# Patient Record
Sex: Male | Born: 1960 | Race: Black or African American | Hispanic: No | Smoking: Former smoker
Health system: Southern US, Community
[De-identification: ages and names within clinical notes are randomized; demographics above are authoritative.]

## PROBLEM LIST (undated history)

## (undated) DIAGNOSIS — A159 Respiratory tuberculosis unspecified: Secondary | ICD-10-CM

## (undated) DIAGNOSIS — I10 Essential (primary) hypertension: Secondary | ICD-10-CM

## (undated) DIAGNOSIS — K859 Acute pancreatitis without necrosis or infection, unspecified: Secondary | ICD-10-CM

## (undated) DIAGNOSIS — R0600 Dyspnea, unspecified: Secondary | ICD-10-CM

## (undated) DIAGNOSIS — I429 Cardiomyopathy, unspecified: Secondary | ICD-10-CM

## (undated) DIAGNOSIS — K219 Gastro-esophageal reflux disease without esophagitis: Secondary | ICD-10-CM

## (undated) DIAGNOSIS — N184 Chronic kidney disease, stage 4 (severe): Secondary | ICD-10-CM

## (undated) DIAGNOSIS — M109 Gout, unspecified: Secondary | ICD-10-CM

## (undated) DIAGNOSIS — M1990 Unspecified osteoarthritis, unspecified site: Secondary | ICD-10-CM

## (undated) DIAGNOSIS — E669 Obesity, unspecified: Secondary | ICD-10-CM

## (undated) DIAGNOSIS — D649 Anemia, unspecified: Secondary | ICD-10-CM

## (undated) DIAGNOSIS — G4730 Sleep apnea, unspecified: Secondary | ICD-10-CM

## (undated) DIAGNOSIS — I509 Heart failure, unspecified: Secondary | ICD-10-CM

## (undated) DIAGNOSIS — R079 Chest pain, unspecified: Secondary | ICD-10-CM

## (undated) DIAGNOSIS — H409 Unspecified glaucoma: Secondary | ICD-10-CM

## (undated) DIAGNOSIS — Z973 Presence of spectacles and contact lenses: Secondary | ICD-10-CM

## (undated) DIAGNOSIS — C801 Malignant (primary) neoplasm, unspecified: Secondary | ICD-10-CM

## (undated) DIAGNOSIS — D126 Benign neoplasm of colon, unspecified: Secondary | ICD-10-CM

## (undated) HISTORY — PX: HERNIA REPAIR: SHX51

## (undated) HISTORY — DX: Benign neoplasm of colon, unspecified: D12.6

## (undated) HISTORY — DX: Respiratory tuberculosis unspecified: A15.9

## (undated) HISTORY — DX: Unspecified glaucoma: H40.9

## (undated) HISTORY — PX: UMBILICAL HERNIA REPAIR: SHX196

## (undated) HISTORY — PX: WISDOM TOOTH EXTRACTION: SHX21

## (undated) HISTORY — DX: Acute pancreatitis without necrosis or infection, unspecified: K85.90

## (undated) HISTORY — DX: Gastro-esophageal reflux disease without esophagitis: K21.9

## (undated) HISTORY — PX: LAPAROSCOPIC CHOLECYSTECTOMY: SUR755

## (undated) HISTORY — DX: Sleep apnea, unspecified: G47.30

## (undated) HISTORY — PX: COLONOSCOPY W/ BIOPSIES AND POLYPECTOMY: SHX1376

---
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
## 1997-11-12 ENCOUNTER — Encounter: Admission: RE | Admit: 1997-11-12 | Discharge: 1997-11-12 | Payer: Self-pay | Admitting: *Deleted

## 2001-12-06 ENCOUNTER — Emergency Department (HOSPITAL_COMMUNITY): Admission: EM | Admit: 2001-12-06 | Discharge: 2001-12-06 | Payer: Self-pay

## 2002-11-14 ENCOUNTER — Encounter: Admission: RE | Admit: 2002-11-14 | Discharge: 2002-11-14 | Payer: Self-pay | Admitting: Family Medicine

## 2002-11-14 ENCOUNTER — Encounter: Payer: Self-pay | Admitting: Family Medicine

## 2003-04-24 ENCOUNTER — Emergency Department (HOSPITAL_COMMUNITY): Admission: EM | Admit: 2003-04-24 | Discharge: 2003-04-24 | Payer: Self-pay | Admitting: Emergency Medicine

## 2003-11-13 ENCOUNTER — Encounter: Admission: RE | Admit: 2003-11-13 | Discharge: 2003-11-13 | Payer: Self-pay | Admitting: Family Medicine

## 2003-11-14 ENCOUNTER — Ambulatory Visit (HOSPITAL_COMMUNITY): Admission: RE | Admit: 2003-11-14 | Discharge: 2003-11-14 | Payer: Self-pay | Admitting: Family Medicine

## 2003-12-19 ENCOUNTER — Encounter (INDEPENDENT_AMBULATORY_CARE_PROVIDER_SITE_OTHER): Payer: Self-pay | Admitting: Specialist

## 2003-12-19 ENCOUNTER — Observation Stay (HOSPITAL_COMMUNITY): Admission: RE | Admit: 2003-12-19 | Discharge: 2003-12-20 | Payer: Self-pay | Admitting: General Surgery

## 2004-06-02 ENCOUNTER — Emergency Department (HOSPITAL_COMMUNITY): Admission: EM | Admit: 2004-06-02 | Discharge: 2004-06-02 | Payer: Self-pay | Admitting: Emergency Medicine

## 2004-07-29 ENCOUNTER — Emergency Department (HOSPITAL_COMMUNITY): Admission: EM | Admit: 2004-07-29 | Discharge: 2004-07-29 | Payer: Self-pay | Admitting: Emergency Medicine

## 2005-01-12 ENCOUNTER — Ambulatory Visit: Payer: Self-pay | Admitting: Family Medicine

## 2005-01-12 ENCOUNTER — Encounter: Admission: RE | Admit: 2005-01-12 | Discharge: 2005-01-12 | Payer: Self-pay | Admitting: Family Medicine

## 2005-01-14 ENCOUNTER — Ambulatory Visit: Payer: Self-pay | Admitting: Family Medicine

## 2005-01-23 ENCOUNTER — Ambulatory Visit: Payer: Self-pay | Admitting: Family Medicine

## 2005-03-26 ENCOUNTER — Ambulatory Visit: Payer: Self-pay | Admitting: Family Medicine

## 2005-03-27 ENCOUNTER — Ambulatory Visit: Payer: Self-pay | Admitting: Cardiology

## 2005-03-28 ENCOUNTER — Encounter: Admission: RE | Admit: 2005-03-28 | Discharge: 2005-03-28 | Payer: Self-pay | Admitting: Family Medicine

## 2005-03-30 ENCOUNTER — Ambulatory Visit: Payer: Self-pay | Admitting: Family Medicine

## 2005-08-12 ENCOUNTER — Emergency Department (HOSPITAL_COMMUNITY): Admission: EM | Admit: 2005-08-12 | Discharge: 2005-08-12 | Payer: Self-pay | Admitting: Emergency Medicine

## 2006-03-02 ENCOUNTER — Ambulatory Visit: Payer: Self-pay | Admitting: Family Medicine

## 2006-03-03 ENCOUNTER — Ambulatory Visit: Payer: Self-pay | Admitting: Family Medicine

## 2006-03-03 LAB — CONVERTED CEMR LAB
ALT: 18 units/L (ref 0–40)
AST: 19 units/L (ref 0–37)
Albumin: 3.9 g/dL (ref 3.5–5.2)
Alkaline Phosphatase: 36 units/L — ABNORMAL LOW (ref 39–117)
BUN: 19 mg/dL (ref 6–23)
Basophils Absolute: 0 10*3/uL (ref 0.0–0.1)
Basophils Relative: 0.1 % (ref 0.0–1.0)
CO2: 29 meq/L (ref 19–32)
Calcium: 9.9 mg/dL (ref 8.4–10.5)
Chloride: 105 meq/L (ref 96–112)
Chol/HDL Ratio, serum: 5.5
Cholesterol: 197 mg/dL (ref 0–200)
Creatinine, Ser: 2.4 mg/dL — ABNORMAL HIGH (ref 0.4–1.5)
Eosinophil percent: 0.1 % (ref 0.0–5.0)
GFR calc non Af Amer: 31 mL/min
Glomerular Filtration Rate, Af Am: 38 mL/min/{1.73_m2}
Glucose, Bld: 130 mg/dL — ABNORMAL HIGH (ref 70–99)
HCT: 37.4 % — ABNORMAL LOW (ref 39.0–52.0)
HDL: 35.9 mg/dL — ABNORMAL LOW (ref >39.0)
Hemoglobin: 12.6 g/dL — ABNORMAL LOW (ref 13.0–17.0)
Hgb A1c MFr Bld: 6.2 % — ABNORMAL HIGH (ref 4.6–6.0)
LDL Cholesterol: 142 mg/dL — ABNORMAL HIGH (ref 0–99)
Lymphocytes Relative: 8.6 % — ABNORMAL LOW (ref 12.0–46.0)
MCHC: 33.6 g/dL (ref 30.0–36.0)
MCV: 82.5 fL (ref 78.0–100.0)
Monocytes Absolute: 0.3 10*3/uL (ref 0.2–0.7)
Monocytes Relative: 3.1 % (ref 3.0–11.0)
Neutro Abs: 9.3 10*3/uL — ABNORMAL HIGH (ref 1.4–7.7)
Neutrophils Relative %: 88.1 % — ABNORMAL HIGH (ref 43.0–77.0)
PSA: 0.58 ng/mL (ref 0.10–4.00)
Platelets: 241 10*3/uL (ref 150–400)
Potassium: 3.8 meq/L (ref 3.5–5.1)
RBC: 4.54 M/uL (ref 4.22–5.81)
RDW: 12.8 % (ref 11.5–14.6)
Sodium: 144 meq/L (ref 135–145)
TSH: 0.42 microintl units/mL (ref 0.35–5.50)
Total Bilirubin: 0.6 mg/dL (ref 0.3–1.2)
Total Protein: 6.6 g/dL (ref 6.0–8.3)
Triglyceride fasting, serum: 98 mg/dL (ref 0–149)
VLDL: 20 mg/dL (ref 0–40)
WBC: 10.5 10*3/uL (ref 4.5–10.5)

## 2006-04-16 ENCOUNTER — Ambulatory Visit: Payer: Self-pay | Admitting: Family Medicine

## 2006-05-06 ENCOUNTER — Emergency Department (HOSPITAL_COMMUNITY): Admission: EM | Admit: 2006-05-06 | Discharge: 2006-05-06 | Payer: Self-pay | Admitting: Emergency Medicine

## 2006-06-21 ENCOUNTER — Ambulatory Visit: Payer: Self-pay | Admitting: Family Medicine

## 2006-07-26 ENCOUNTER — Ambulatory Visit: Payer: Self-pay | Admitting: Family Medicine

## 2006-08-10 ENCOUNTER — Ambulatory Visit: Payer: Self-pay | Admitting: Gastroenterology

## 2006-08-26 ENCOUNTER — Ambulatory Visit: Payer: Self-pay | Admitting: Gastroenterology

## 2006-08-26 ENCOUNTER — Encounter: Payer: Self-pay | Admitting: Gastroenterology

## 2006-09-28 ENCOUNTER — Ambulatory Visit: Payer: Self-pay | Admitting: Family Medicine

## 2006-09-29 LAB — CONVERTED CEMR LAB
BUN: 14 mg/dL (ref 6–23)
Basophils Absolute: 0 10*3/uL (ref 0.0–0.1)
Basophils Relative: 0.3 % (ref 0.0–1.0)
CO2: 26 meq/L (ref 19–32)
Calcium: 9.5 mg/dL (ref 8.4–10.5)
Chloride: 112 meq/L (ref 96–112)
Creatinine, Ser: 2 mg/dL — ABNORMAL HIGH (ref 0.4–1.5)
Eosinophils Absolute: 0.3 10*3/uL (ref 0.0–0.6)
Eosinophils Relative: 4.2 % (ref 0.0–5.0)
GFR calc Af Amer: 47 mL/min
GFR calc non Af Amer: 39 mL/min
Glucose, Bld: 83 mg/dL (ref 70–99)
HCT: 36.7 % — ABNORMAL LOW (ref 39.0–52.0)
Hemoglobin: 12.2 g/dL — ABNORMAL LOW (ref 13.0–17.0)
Lymphocytes Relative: 31.9 % (ref 12.0–46.0)
MCHC: 33.4 g/dL (ref 30.0–36.0)
MCV: 83.6 fL (ref 78.0–100.0)
Monocytes Absolute: 0.7 10*3/uL (ref 0.2–0.7)
Monocytes Relative: 11.5 % — ABNORMAL HIGH (ref 3.0–11.0)
Neutro Abs: 3.2 10*3/uL (ref 1.4–7.7)
Neutrophils Relative %: 52.1 % (ref 43.0–77.0)
Platelets: 202 10*3/uL (ref 150–400)
Potassium: 3.5 meq/L (ref 3.5–5.1)
RBC: 4.38 M/uL (ref 4.22–5.81)
RDW: 13.9 % (ref 11.5–14.6)
Sodium: 141 meq/L (ref 135–145)
Uric Acid, Serum: 8.7 mg/dL — ABNORMAL HIGH (ref 2.4–7.0)
WBC: 6.2 10*3/uL (ref 4.5–10.5)

## 2006-09-30 ENCOUNTER — Ambulatory Visit: Payer: Self-pay | Admitting: Family Medicine

## 2006-10-05 ENCOUNTER — Ambulatory Visit: Payer: Self-pay | Admitting: Family Medicine

## 2006-12-29 DIAGNOSIS — M549 Dorsalgia, unspecified: Secondary | ICD-10-CM | POA: Insufficient documentation

## 2006-12-30 DIAGNOSIS — M5126 Other intervertebral disc displacement, lumbar region: Secondary | ICD-10-CM | POA: Insufficient documentation

## 2006-12-31 ENCOUNTER — Ambulatory Visit: Payer: Self-pay | Admitting: Family Medicine

## 2006-12-31 DIAGNOSIS — E785 Hyperlipidemia, unspecified: Secondary | ICD-10-CM | POA: Insufficient documentation

## 2006-12-31 DIAGNOSIS — M109 Gout, unspecified: Secondary | ICD-10-CM | POA: Insufficient documentation

## 2006-12-31 DIAGNOSIS — I10 Essential (primary) hypertension: Secondary | ICD-10-CM | POA: Insufficient documentation

## 2007-01-10 ENCOUNTER — Emergency Department (HOSPITAL_COMMUNITY): Admission: EM | Admit: 2007-01-10 | Discharge: 2007-01-11 | Payer: Self-pay | Admitting: Emergency Medicine

## 2007-01-12 ENCOUNTER — Ambulatory Visit: Payer: Self-pay | Admitting: Family Medicine

## 2007-01-12 ENCOUNTER — Telehealth (INDEPENDENT_AMBULATORY_CARE_PROVIDER_SITE_OTHER): Payer: Self-pay | Admitting: *Deleted

## 2007-01-12 ENCOUNTER — Telehealth: Payer: Self-pay | Admitting: Family Medicine

## 2007-01-13 ENCOUNTER — Telehealth: Payer: Self-pay | Admitting: Family Medicine

## 2007-01-14 ENCOUNTER — Telehealth (INDEPENDENT_AMBULATORY_CARE_PROVIDER_SITE_OTHER): Payer: Self-pay | Admitting: *Deleted

## 2007-01-16 ENCOUNTER — Encounter: Admission: RE | Admit: 2007-01-16 | Discharge: 2007-01-16 | Payer: Self-pay | Admitting: Family Medicine

## 2007-01-17 ENCOUNTER — Telehealth (INDEPENDENT_AMBULATORY_CARE_PROVIDER_SITE_OTHER): Payer: Self-pay | Admitting: *Deleted

## 2007-01-18 ENCOUNTER — Ambulatory Visit: Payer: Self-pay | Admitting: Family Medicine

## 2007-05-07 ENCOUNTER — Emergency Department (HOSPITAL_COMMUNITY): Admission: EM | Admit: 2007-05-07 | Discharge: 2007-05-08 | Payer: Self-pay | Admitting: Emergency Medicine

## 2008-05-16 ENCOUNTER — Ambulatory Visit: Payer: Self-pay | Admitting: Family Medicine

## 2008-05-16 LAB — CONVERTED CEMR LAB
ALT: 21 units/L (ref 0–53)
AST: 21 units/L (ref 0–37)
Albumin: 3.8 g/dL (ref 3.5–5.2)
Alkaline Phosphatase: 49 units/L (ref 39–117)
BUN: 18 mg/dL (ref 6–23)
Basophils Absolute: 0 10*3/uL (ref 0.0–0.1)
Basophils Relative: 0 % (ref 0.0–3.0)
Bilirubin Urine: NEGATIVE
Bilirubin, Direct: 0.1 mg/dL (ref 0.0–0.3)
Blood in Urine, dipstick: NEGATIVE
CO2: 29 meq/L (ref 19–32)
Calcium: 9 mg/dL (ref 8.4–10.5)
Chloride: 105 meq/L (ref 96–112)
Cholesterol: 193 mg/dL (ref 0–200)
Creatinine, Ser: 2.2 mg/dL — ABNORMAL HIGH (ref 0.4–1.5)
Eosinophils Absolute: 0.2 10*3/uL (ref 0.0–0.7)
Eosinophils Relative: 3.9 % (ref 0.0–5.0)
GFR calc Af Amer: 41 mL/min
GFR calc non Af Amer: 34 mL/min
Glucose, Bld: 92 mg/dL (ref 70–99)
Glucose, Urine, Semiquant: NEGATIVE
HCT: 42.4 % (ref 39.0–52.0)
HDL: 30.7 mg/dL — ABNORMAL LOW (ref >39.0)
Hemoglobin: 14.1 g/dL (ref 13.0–17.0)
Ketones, urine, test strip: NEGATIVE
LDL Cholesterol: 143 mg/dL — ABNORMAL HIGH (ref 0–99)
Lymphocytes Relative: 40.9 % (ref 12.0–46.0)
MCHC: 33.3 g/dL (ref 30.0–36.0)
MCV: 81.5 fL (ref 78.0–100.0)
Monocytes Absolute: 0.3 10*3/uL (ref 0.1–1.0)
Monocytes Relative: 6.2 % (ref 3.0–12.0)
Neutro Abs: 2.6 10*3/uL (ref 1.4–7.7)
Neutrophils Relative %: 49 % (ref 43.0–77.0)
Nitrite: NEGATIVE
PSA: 0.55 ng/mL (ref 0.10–4.00)
Platelets: 160 10*3/uL (ref 150–400)
Potassium: 3.9 meq/L (ref 3.5–5.1)
RBC: 5.2 M/uL (ref 4.22–5.81)
RDW: 13 % (ref 11.5–14.6)
Sodium: 141 meq/L (ref 135–145)
Specific Gravity, Urine: 1.015
TSH: 1.9 microintl units/mL (ref 0.35–5.50)
Total Bilirubin: 0.7 mg/dL (ref 0.3–1.2)
Total CHOL/HDL Ratio: 6.3
Total Protein: 6.5 g/dL (ref 6.0–8.3)
Triglycerides: 96 mg/dL (ref 0–149)
Urobilinogen, UA: 0.2
VLDL: 19 mg/dL (ref 0–40)
WBC Urine, dipstick: NEGATIVE
WBC: 5.3 10*3/uL (ref 4.5–10.5)
pH: 6

## 2008-06-14 ENCOUNTER — Ambulatory Visit: Payer: Self-pay | Admitting: Family Medicine

## 2008-07-05 ENCOUNTER — Ambulatory Visit: Payer: Self-pay | Admitting: Family Medicine

## 2008-08-16 ENCOUNTER — Emergency Department (HOSPITAL_COMMUNITY): Admission: EM | Admit: 2008-08-16 | Discharge: 2008-08-16 | Payer: Self-pay | Admitting: Family Medicine

## 2008-10-02 ENCOUNTER — Telehealth: Payer: Self-pay | Admitting: Family Medicine

## 2009-02-08 ENCOUNTER — Telehealth: Payer: Self-pay | Admitting: Family Medicine

## 2009-03-13 ENCOUNTER — Emergency Department (HOSPITAL_COMMUNITY): Admission: EM | Admit: 2009-03-13 | Discharge: 2009-03-13 | Payer: Self-pay | Admitting: Emergency Medicine

## 2009-03-26 ENCOUNTER — Telehealth: Payer: Self-pay | Admitting: Family Medicine

## 2010-04-13 ENCOUNTER — Encounter: Payer: Self-pay | Admitting: Family Medicine

## 2010-04-22 NOTE — Progress Notes (Signed)
Summary: refill  Phone Note Call from Patient   Summary of Call: refill colchicine   Initial call taken by: Westley Hummer CMA (AAMA),  February 08, 2009 8:32 AM    New/Updated Medications: COLCHICINE 0.6 MG TABS (COLCHICINE) take one tab every 6 hours as needed Prescriptions: COLCHICINE 0.6 MG TABS (COLCHICINE) take one tab every 6 hours as needed  #90 x 1   Entered by:   Westley Hummer CMA ([place])   Authorized by:   Dorena Cookey MD   Signed by:   Westley Hummer CMA ([place]) on 02/08/2009   Method used:   Electronically to        [facility] (retail)       [address]       [place], [place]  [id number]       Ph: [phone]       Fax: [phone]   RxIDKB:[id number]

## 2010-04-22 NOTE — Progress Notes (Signed)
Summary: Larger MRI machine needed  Phone Note Call from Patient Call back at Home Phone [phone]   Caller: Patient Call For: Sherren Mocha Summary of Call: Pt called to report he was unable to fit into the MRI machine.  Was instructed to contact provider to schedule at a larger unit.  Pt weight is 281, height 6'2".  Not claustrobic. Initial call taken by: Nira Conn LPN,  October 22, [id number] 3:41 PM  Follow-up for Phone Call        Appt with [facility] @ 7:00pm patient in office Follow-up by: Rolinda Roan,  January 12, 2007 4:07 PM

## 2010-04-22 NOTE — Assessment & Plan Note (Signed)
Summary: fu on mri/njr   Vital Signs:  Patient Profile:   50 Years Old Male Weight:      273 pounds (124.09 kg) Temp:     98.1 degrees F (36.72 degrees C) oral BP sitting:   140 / 76  (right arm)  Pt. in pain?   no  Vitals Entered By: Vilinda Blanks, RN (January 18, 2007 10:15 AM)                  Chief Complaint:  f/u and fill out work forms.  History of Present Illness: Craig Flynn is a 50 year old male comes in today for evaluation of back pain.  He was seen last week with severe back pain.  On 1020 2008.  He was given a pain medication Flexeril advised to stay at home at bedrest.  Over the course the week.  His pain became worse.  He centrally had an MRI which showed no change from a previous MRI and 2006 for the same problem.  We do not see evidence of any surgical treatment at this juncture.  Patient is back today for follow-up saying his 85% better.  He wants to go back to work next Monday.  He is to start PT this Thursday.  He is also concerned about some fluid retention.  Would like a low-dose diuretic.  Acute Visit History:      He denies abdominal pain, chest pain, constipation, cough, diarrhea, earache, eye symptoms, fever, genitourinary symptoms, headache, musculoskeletal symptoms, nasal discharge, nausea, rash, sinus problems, sore throat, and vomiting.         Current Allergies (reviewed today): No known allergies       Physical Exam  General:     Well-developed,well-nourished,in no acute distress; alert,appropriate and cooperative throughout examination Msk:     No deformity or scoliosis noted of thoracic or lumbar spine.   Pulses:     R and L carotid,radial,femoral,dorsalis pedis and posterior tibial pulses are full and equal bilaterally Extremities:     1+ left pedal edema and 1+ right pedal edema.   Neurologic:     No cranial nerve deficits noted. Station and gait are normal. Plantar reflexes are down-going bilaterally. DTRs are symmetrical  throughout. Sensory, motor and coordinative functions appear intact.straight leg raising negative.  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 Uad 2)  Amoxicillin-pot Clavulanate 875-125 Mg Tabs (Amoxicillin-pot clavulanate) 3)  Atenolol-chlorthalidone 50-25 Mg Tabs (Atenolol-chlorthalidone) 4)  Viagra 100 Mg Tabs (Sildenafil citrate) 5)  Flexeril 10 Mg Tabs (Cyclobenzaprine hcl) .Marland Kitchen.. 1 tab @ bedtime 6)  Vicodin Es 7.5-750 Mg Tabs (Hydrocodone-acetaminophen) .... One two times a day prn 7)  Hydrochlorothiazide 25 Mg Tabs (Hydrochlorothiazide) .... Take 1 tablet by mouth every morning   Patient Instructions: 1)  take 600 mg of Motrin 3 times a day with food.  He may take a half a Vicodin or full Vicodin once or twice a day as needed for pain.  Take Flexeril, 10 mg one at bedtime.  Walk lie down walk light and avoid sitting.  Start physical therapy on, Thursday, it's okay to return to work the following Monday November the third    Prescriptions: HYDROCHLOROTHIAZIDE 25 MG  TABS (HYDROCHLOROTHIAZIDE) Take 1 tablet by mouth every morning  #100  x 4   Entered and Authorized by:   Craig Cookey MD   Signed by:   Craig Cookey MD on 01/18/2007   Method used:   Print then Give to Patient   RxID:   [id number] VICODIN ES 7.5-750 MG  TABS (HYDROCODONE-ACETAMINOPHEN) one two times a day prn  #60 x 1   Entered and Authorized by:   Craig Cookey MD   Signed by:   Craig Cookey MD on 01/18/2007   Method used:   Print then Give to Patient   RxID:   [id number] FLEXERIL 10 MG  TABS (CYCLOBENZAPRINE HCL) 1 tab @ bedtime  #30 x 1   Entered and Authorized by:   Craig Cookey MD   Signed by:   Craig Cookey MD on 01/18/2007   Method used:   Print  then Give to Patient   RxIDLL:[id number]  ]

## 2010-04-22 NOTE — Progress Notes (Signed)
Summary: NEEDS APPT FOR MRI  Phone Note Call from Patient Call back at Home Phone [phone]   Caller: Patient Call For: TODD Summary of Call: NEEDS MRI RESCHEDULED  THE LAST ONE WAS TOO SMALL NEEDS TO GO TO OPEN MRI Initial call taken by: Shanon Payor,  January 14, 2007 10:37 AM  Follow-up for Phone Call        Faxed order to [facility] for an open unit Office will contact patient Follow-up by: Rolinda Roan,  January 14, 2007 12:31 PM         Appended Document: NEEDS APPT FOR MRI 10/26 @ 7:45am @ [facility] Patient aware

## 2010-04-22 NOTE — Progress Notes (Signed)
Summary: REFILL  Phone Note From Pharmacy Call back at [phone]   Caller: [facility] [address] Call For: Craig Flynn  Summary of Call: [place]  Initial call taken by: Shanon Payor,  January 13, 2007 9:57 AM  Follow-up for Phone Call        Pharmacist called, GOT NEW RX FROM ANOTHER DR WHILE DR Jilliane Kazanjian WAS OT OF THE OFFICE.  RX WAS GIVEN TO THE PT. THE PHARMACY SAID "THEY HAVE NOT RECEIVED THAT RX YET Follow-up by: Vilinda Blanks, RN,  January 13, 2007 10:20 AM

## 2010-04-22 NOTE — Progress Notes (Signed)
Summary: gout med  Phone Note Call from Patient Call back at Home Phone [phone]   Caller: vm Call For: todd Summary of Call: [place].  Went to ER for gout flareup.  Prednisone given, but not enough given, it did not control flareup.  Can Dr. Sherren Mocha call in another Rx until I can get my Medicaid?  [facility] [place]. Initial call taken by: Shelbie Hutching, RN,  March 26, 2009 11:15 AM  Follow-up for Phone Call        patient needs office visit Follow-up by: Dorena Cookey MD,  March 26, 2009 1:32 PM  Additional Follow-up for Phone Call Additional follow up Details #1::        Phone Call Completed.  Patient will not set appointment at this time because he has no insurance & no cash for the cost of ov. Additional Follow-up by: Shelbie Hutching, RN,  March 26, 2009 3:12 PM

## 2010-04-22 NOTE — Progress Notes (Signed)
Summary: NEEDS RESULTS  Phone Note Call from Patient Call back at Home Phone [phone]   Caller: Patient Call For: DR TODD Reason for Call: Lab or Test Results Summary of Call: WOULD LIKE [place]. DOES HE NEED AN OV TO DISCUSS? Initial call taken by: Despina Arias,  January 17, 2007 10:51 AM  Follow-up for Phone Call        I called the patient and talked to him personally reviewed his scan.  It's essentially unchanged.  The one he had in 2006.  He is to continue the Vicodin walk lie down walk lie down.  He says he also is paperwork.  He needs to fill out.  He will be given appointment for more to get that done.  Will also get him set up for physical therapy ASAP. Follow-up by: Dorena Cookey MD,  January 17, 2007 2:16 PM  Additional Follow-up for Phone Call Additional follow up Details #1::        Faxed order to [facility]/office will contact patient Additional Follow-up by: Rolinda Roan,  January 17, 2007 2:20 PM

## 2010-04-22 NOTE — Assessment & Plan Note (Signed)
Summary: ?kidney inf/njr  Medications Added AMOXICILLIN-POT CLAVULANATE 875-125 MG TABS (AMOXICILLIN-POT CLAVULANATE)  ATENOLOL-CHLORTHALIDONE 50-25 MG TABS (ATENOLOL-CHLORTHALIDONE)  VIAGRA 100 MG TABS (SILDENAFIL CITRATE)       Allergies Added: NKDA  Vital Signs:  Patient Profile:   50 Years Old Male Weight:      278 pounds (126.36 kg) Temp:     98.8 degrees F (37.11 degrees C) oral BP sitting:   150 / 70  (right arm)  Pt. in pain?   yes    Location:   lower back    Intensity:   2-9    Type:       sharp  Vitals Entered By: Vilinda Blanks, RN (December 31, 2006 12:10 PM)                  Chief Complaint:  "lower back pain and legs are numb when I stand up sometimes".  History of Present Illness: Craig Flynn is a 50 year male, who comes in today for evaluation of the sudden onset of severe back pain.  On December 29, 2006 at 4:30 a.m.  He went to bed felt fine woke up at 430 in with severe back pain.  He points to the T12, L1 area as the source of his pain.  He says his right-sided and left-sided and radiates down both legs to mid calves.  It's constant sharp, on a [place]  It's increased by bending or sitting.  In the spell like this a couple years ago wasn't as bad.  He took some Motrin and went away.  Review of systems negative urines been normal.  No history kidney stones.  No history of trauma.  Acute Visit History:      He denies abdominal pain, chest pain, constipation, cough, diarrhea, earache, eye symptoms, fever, genitourinary symptoms, headache, musculoskeletal symptoms, nasal discharge, nausea, rash, sinus problems, sore throat, and vomiting.         Current Allergies: No known allergies   Past Medical History:    Reviewed history and no changes required:       Gout       Hyperlipidemia       Hypertension   Family History:    Reviewed history and no changes required:  Social History:    Reviewed history and no changes required:   Risk Factors:   Tobacco use:  current    Counseled to quit/cut down tobacco use:  yes Alcohol use:  no   Review of Systems      See HPI   Physical Exam  General:     Well-developed,well-nourished,in no acute distress; alert,appropriate and cooperative throughout examination Abdomen:     Bowel sounds positive,abdomen soft and non-tender without masses, organomegaly or hernias noted. Msk:     No deformity or scoliosis noted of thoracic or lumbar spine.   Pulses:     R and L carotid,radial,femoral,dorsalis pedis and posterior tibial pulses are full and equal bilaterally Extremities:     No clubbing, cyanosis, edema, or deformity noted with normal full range of motion of all joints.   Neurologic:     No cranial nerve deficits noted. Station and gait are normal. Plantar reflexes are down-going bilaterally. DTRs are symmetrical throughout. Sensory, motor and coordinative functions appear intact. Skin:     Intact without suspicious lesions or rashes Psych:     Cognition and judgment appear intact. Alert and cooperative with normal attention span and concentration. No apparent delusions, illusions, hallucinations    Impression &  Recommendations:  Problem # 1:  BACKACHE NOS (ICD-724.5) Assessment: New  Orders: UA Dipstick w/o Micro (81002)   Complete Medication List: 1)  Prednisone 20 Mg Tabs (Prednisone) .... Uad 2)  Amoxicillin-pot Clavulanate 875-125 Mg Tabs (Amoxicillin-pot clavulanate) 3)  Atenolol-chlorthalidone 50-25 Mg Tabs (Atenolol-chlorthalidone) 4)  Viagra 100 Mg Tabs (Sildenafil citrate)   Patient Instructions: 1)  I think you have a bulging disk in her back giving it is severe pain.  I want to stay at bed rest, get up to the bathroom to eat otherwise stated bed rest.  All day today.  All day Saturday, and all day Sunday.  Return 8:30 a.m. Monday morning for recheck. 2)  Take  two 20 mg prednisone tablets a day, Flexeril, 10 mg 3 times a day, and Vicodin ES 3 times a day.remember  medication and bedrest makes people constipated to take some milk of Magnesia or prune juice.    ]

## 2010-04-22 NOTE — Progress Notes (Signed)
Summary: pt requesting refill of muscle relaxer for back pain  Phone Note Call from Patient Call back at Home Phone [phone]   Caller: Patient Call For: Craig Cookey MD Summary of Call: pt is requesting refill of a muscle relaxer that Dr. Sherren Mocha had prescribed for back pain. Call it in to [facility] on [address].  Initial call taken by: Braulio Bosch,  October 02, 2008 12:17 PM  Follow-up for Phone Call        rx called in Follow-up by: Westley Hummer CMA,  October 02, 2008 1:44 PM    Flexeril 10 mg, dispensed 30 tablets, one half tablet nightly, p.r.n., low back pain, no refills

## 2010-04-22 NOTE — Assessment & Plan Note (Signed)
Summary: 3 WK ROV/NJR   Vital Signs:  Patient profile:   50 year old male Weight:      286 pounds Temp:     98.5 degrees F oral BP sitting:   144 / 98  (left arm) Cuff size:   large  Vitals Entered By: Westley Hummer CMA (July 05, 2008 9:07 AM)  Reason for Visit follow up bp  History of Present Illness: Craig Flynn is a 50 year old male, who comes in today for evaluation of hypertension.  He takes Tenoretic 50 to 25 and 25 mg of HCTZ every morning for blood pressure and fluid control.  BP is still running 150/90 at home.  No side effects from medication, pulse 60 and regular  Allergies (verified): No Known Drug Allergies PMH reviewed for relevance  Social History:    Reviewed history and no changes required:  Review of Systems      See HPI  Physical Exam  General:  Well-developed,well-nourished,in no acute distress; alert,appropriate and cooperative throughout examination Heart:  150/90   Impression & Recommendations:  Problem # 1:  HYPERTENSION (ICD-401.9) Assessment Unchanged  His updated medication list for this problem includes:    Atenolol-chlorthalidone 50-25 Mg Tabs (Atenolol-chlorthalidone) .Marland Kitchen... Take 1 tablet by mouth every morning    Hydrochlorothiazide 25 Mg Tabs (Hydrochlorothiazide) .Marland Kitchen... Take 1 tablet by mouth every morning    Norvasc 10 Mg Tabs (Amlodipine besylate) .Marland Kitchen... Take 1 tablet by mouth every morning  Complete Medication List: 1)  Atenolol-chlorthalidone 50-25 Mg Tabs (Atenolol-chlorthalidone) .... Take 1 tablet by mouth every morning 2)  Viagra 100 Mg Tabs (Sildenafil citrate) .... Uad 3)  Hydrochlorothiazide 25 Mg Tabs (Hydrochlorothiazide) .... Take 1 tablet by mouth every morning 4)  Allopurinol 300 Mg Tabs (Allopurinol) .... Take 1 tablet by mouth once a day 5)  Norvasc 10 Mg Tabs (Amlodipine besylate) .... Take 1 tablet by mouth every morning  Patient Instructions: 1)  add Norvasc, 10 mg every morning to your other blood pressure  medications.  Check her blood pressure every morning.  Return 4 weeks for follow-up Prescriptions: NORVASC 10 MG TABS (AMLODIPINE BESYLATE) Take 1 tablet by mouth every morning  #100 x 3   Entered and Authorized by:   Dorena Cookey MD   Signed by:   Dorena Cookey MD on 07/05/2008   Method used:   Electronically to        [facility]. [id number]* (retail)       [address]       [place], [place]  [address]       Ph: [phone] or [phone]       Fax: [phone]   RxID:   [id number]

## 2010-06-23 LAB — POCT I-STAT, CHEM 8
BUN: 35 mg/dL — ABNORMAL HIGH (ref 6–23)
Calcium, Ion: 1.24 mmol/L (ref 1.12–1.32)
Chloride: 108 mEq/L (ref 96–112)
Creatinine, Ser: 2.3 mg/dL — ABNORMAL HIGH (ref 0.4–1.5)
Glucose, Bld: 80 mg/dL (ref 70–99)
HCT: 42 % (ref 39.0–52.0)
Hemoglobin: 14.3 g/dL (ref 13.0–17.0)
Potassium: 3.9 mEq/L (ref 3.5–5.1)
Sodium: 140 mEq/L (ref 135–145)
TCO2: 26 mmol/L (ref 0–100)

## 2010-07-01 LAB — SYNOVIAL CELL COUNT + DIFF, W/ CRYSTALS
Lymphocytes-Synovial Fld: 3 % (ref 0–20)
Monocyte-Macrophage-Synovial Fluid: 17 % — ABNORMAL LOW (ref 50–90)
Neutrophil, Synovial: 80 % — ABNORMAL HIGH (ref 0–25)
WBC, Synovial: 4000 /mm3 — ABNORMAL HIGH (ref 0–200)

## 2010-07-01 LAB — BODY FLUID CULTURE: Culture: NO GROWTH

## 2010-07-01 LAB — URIC ACID: Uric Acid, Serum: 10.9 mg/dL — ABNORMAL HIGH (ref 4.0–7.8)

## 2010-08-05 NOTE — Assessment & Plan Note (Signed)
[facility] OFFICE NOTE   Craig Flynn, Craig Flynn                        MRN:          [id number]  DATE:08/10/2006                            DOB:          1960/06/03    REASON FOR REFERRAL:  Fecal incontinence and hematochezia.   HISTORY OF PRESENT ILLNESS:  Craig Flynn is a 50 year old African-American  male referred through the courtesy of Dr. Alysia Penna.  He has no  chronic gastrointestinal complaints.  He notes a one-month history of  small amounts of liquid stool and small amounts of mucus that  leak from  his rectum approximately 30 minute after a bowel movement.  Symptoms  began four weeks ago and have persisted on a daily basis since that  time.  He had one episode of a very small of bright red blood per rectum  that occurred approximately one month ago without recurrence.  He notes  no back pain, rectal pain, leg weakness, constipation, diarrhea,  abdominal pain or weight loss.  He has not had any recent medication  changes.  There is no family history of colon cancer, colon polyps, or  inflammatory bowel disease.  He denies any prior lumbosacral disorders  or other neurologic disorders.   PAST MEDICAL HISTORY:  1. Hypertension.  2. Status post cholecystectomy.   CURRENT MEDICATIONS:  Put on the chart, updated and reviewed.   MEDICATION ALLERGIES:  None known.   SOCIAL HISTORY AND REVIEW OF SYSTEMS:  Per the hand-written form.   PHYSICAL EXAMINATION:  GENERAL:  Overweight African-American male in no  acute distress.  VITAL SIGNS:  Height 62 inches.  Weight 274 pounds.  Blood pressure  112/70.  Pulse 92 and regular.  HEENT EXAM:  Anicteric sclerae.  Oropharynx clear.  CHEST:  Clear to auscultation bilaterally.  CARDIAC:  Regular rate and rhythm without murmurs appreciated.  ABDOMEN:  Soft, nontender, nondistended.  Normoactive bowel sounds.  No  palpable organomegaly, masses, or hernias.  RECTAL  EXAMINATION:  Reveals a slightly decreased sphincter tone with a  slightly decreased squeeze.  No lesions.  Hemoccult negative brown stool  is noted in the vault.  EXTREMITIES:  Without clubbing, cyanosis, or edema.  NEUROLOGIC:  Alert and oriented x3.  Grossly nonfocal.   ASSESSMENT AND Flynn:  Fecal incontinence with one episode of small  volume hematochezia.  Rule out rectal lesions.  Rule out a neurological  process.  The risks, benefits, and alternatives to colonoscopy with  possible biopsy and possible polypectomy discussed with the patient and  he consents to proceed.  This will be scheduled electively.  In the  interim he is to increase his fluid and fiber intake.  If the  colonoscopy is unremarkable will proceed with a lumbosacral MRI.  If his  symptoms persist and no etiology is uncovered he may need further  evaluation by a neurologist and with anorectal manometry studies at a  tertiary medical center.     Craig Flynn. Craig Plan, MD, [facility]  Electronically Signed    MTS/MedQ  DD: 08/11/2006  DT: 08/11/2006  Job #: [id number]  cc:   Craig Flynn. Craig Jews, MD

## 2010-08-05 NOTE — Assessment & Plan Note (Signed)
[facility]                                 ON-CALL NOTE   CREW, OCANA                          MRN:          [id number]  DATE:05/07/2007                            DOB:          05-04-1960    Patient of Dr. Sherren Mocha.   Patient calling because he has had a 3-day history of swelling in his  groin and pain.  Advised to go to the emergency room for evaluation.     Jeffrey A. Sherren Mocha, MD  Electronically Signed    JAT/MedQ  DD: 05/07/2007  DT: 05/09/2007  Job #: [id number]

## 2010-08-08 NOTE — Op Note (Signed)
NAME:  ZANDON, PINELL                 ACCOUNT NO.:  [id number]   MEDICAL RECORD NO.:  [id number]          PATIENT TYPE:  AMB   LOCATION:  DAY                          FACILITY:  [facility]   PHYSICIAN:  Edsel Petrin. Dalbert Batman, M.D.DATE OF BIRTH:  December 27, 1960   DATE OF PROCEDURE:  12/19/2003  DATE OF DISCHARGE:                                 OPERATIVE REPORT   PREOPERATIVE DIAGNOSIS:  Biliary dyskinesia.   POSTOPERATIVE DIAGNOSIS:  Biliary dyskinesia.   PROCEDURE:  Laparoscopic cholecystectomy with intraoperative cholangiogram.   SURGEON:  Edsel Petrin. Dalbert Batman, M.D.   FIRST ASSISTANT:  Shellia Carwin, M.D.   INDICATIONS FOR PROCEDURE:  This is a 50 year old black male who recently  has had three distinct episodes of postprandial right upper quadrant pain,  nausea, and vomiting.  These tend to occur after heavy meals.  Dr. Sherren Mocha  suspected gallbladder disease and placed him on a low fat diet, and that has  controlled his symptoms.  The gallbladder ultrasound is normal.  Hepatobiliary scan was performed which showed decreased ejection fraction,  thought to be consistent with gallbladder dysfunction.  His liver function  tests and lipase and amylase and CBC are all normal.  He is brought to the  operating room electively following counseling as an outpatient.   FINDINGS:  The gallbladder was not thick-walled and was not acutely  inflamed.  It was somewhat discolored.  The cystic duct itself was extremely  tiny but was patent, and we were able to do a cholangiogram.  The  cholangiogram showed a small biliary duct system, but the intrahepatic and  extrahepatic biliary anatomy was normal.  There was no filling defect, and  there was good flow of contrast into the duodenum.  The stomach, duodenum,  small intestine, large intestine were grossly normal to inspection.   DESCRIPTION OF PROCEDURE:  Following the induction of general endotracheal  anesthesia, the patient's abdomen was prepped and draped  in a sterile  fashion.  Marcaine 0.5% with epinephrine was used as a local infiltration  anesthetic.   I made a short vertical incision below the umbilicus.  Dissection was  carried down to the abdominal wall fascia.  There was fairly intense  scarring in this area from a previous hernia repair with mesh.  We did  elevate the fascia and perhaps some of the mesh and made an incision and got  into the preperitoneal space.  I was not able to safely dissect in this area  and was concerned that there might be small bowel adhesions underneath this.  For this reason, I then placed a 10-mm Optiview port in the left abdomen  about 6 to 8 cm above the umbilicus and through the left rectus sheath.  This was inserted uneventfully.  Pneumoperitoneum was created.  We actually  found that there were almost no adhesions in the abdomen.  We then finished  passing the Hasson trocar through the infraumbilical port and secured this  with a pursestring suture of 0 Vicryl.  We then placed the camera back into  the infraumbilical port.  Visualization was carried out,  with findings as  described above.  A 10-mm trocar was placed in the subxiphoid region, and  two 5-mm trocars were placed in the right midabdomen.  The gallbladder  fundus was identified and elevated.  The infundibulum of the gallbladder was  identified and retracted laterally.  I dissected out the cystic duct and the  cystic artery.  The cystic artery was controlled with multiple metal clips  and divided.  A nice large window was created behind the cystic duct.  A  metal clip was placed on the cystic duct close to the gallbladder.  The  cystic duct was incised very carefully because it was so tiny.  We were able  to insert a cholangiogram catheter.  The cholangiogram was obtained using  the C-arm.  The intrahepatic and extrahepatic biliary anatomy was normal.  There was no filling defect.  Contrast passed nicely prograde into the  duodenum.  The  cholangiogram catheter was removed.  The cystic duct was  secured with multiple metal clips and divided.  The gallbladder was  dissected from its bed with electrocautery and removed through the umbilical  port.  The operative field was copiously irrigated.  Hemostasis was  excellent and achieved with electrocautery.  At the completion of the case,  there was no bleeding and no bile leak whatsoever, and the irrigation fluid  was clear.  The trocars were removed under direct vision, and there was no  bleeding from the trocar sites.  The pneumoperitoneum was released.  The  fascia at the umbilicus was closed with 0 Vicryl sutures.  The skin  incisions were closed with subcuticular sutures of 4-0 Vicryl and Steri-  Strips.   The patient tolerated the procedure well and was taken to the recovery room  in stable condition.  Estimated blood loss was about 15 cc.  There were no  complications.  Sponge, needle, and instrument counts were correct.      HMI/MEDQ  D:  12/19/2003  T:  12/19/2003  Job:  [id number]   cc:   Dellis Filbert A. Sherren Mocha, M.D. [facility]

## 2010-08-08 NOTE — Assessment & Plan Note (Signed)
[place]                                 ON-CALL NOTE   Craig Flynn, Craig Flynn                          MRN:          [id number]  DATE:07/13/2006                            DOB:          24-Sep-1960    TIME RECEIVED:  5:58 p.m.   CALLER:  Gavin Pound.   PRIMARY CARE PHYSICIAN:  Dr. Sherren Mocha.   TELEPHONE NUMBER:  [id number].   Patient describes slow bleeding from the rectum over the past two days.  His bowel movements have been regular.  There is no pain involved.  He  simply has some slow dripping of blood intermittently from the rectum.   My advice is to call the office and set up an appointment to see Dr.  Sherren Mocha tomorrow.     [name]. Sarajane Jews, MD  Electronically Signed    SAF/MedQ  DD: 07/13/2006  DT: 07/14/2006  Job #: [id number]

## 2010-12-12 LAB — URINALYSIS, ROUTINE W REFLEX MICROSCOPIC
Glucose, UA: NEGATIVE
Hgb urine dipstick: NEGATIVE
Ketones, ur: NEGATIVE
Nitrite: NEGATIVE
Protein, ur: 100 — AB
Specific Gravity, Urine: 1.034 — ABNORMAL HIGH
Urobilinogen, UA: 1
pH: 6

## 2010-12-12 LAB — URINE MICROSCOPIC-ADD ON

## 2010-12-13 ENCOUNTER — Emergency Department (HOSPITAL_COMMUNITY): Payer: Self-pay

## 2010-12-13 ENCOUNTER — Emergency Department (HOSPITAL_COMMUNITY)
Admission: EM | Admit: 2010-12-13 | Discharge: 2010-12-13 | Disposition: A | Discharge status: Home / Self Care | Payer: Self-pay | Attending: Emergency Medicine | Admitting: Emergency Medicine

## 2010-12-13 DIAGNOSIS — Z79899 Other long term (current) drug therapy: Secondary | ICD-10-CM | POA: Insufficient documentation

## 2010-12-13 DIAGNOSIS — I10 Essential (primary) hypertension: Secondary | ICD-10-CM | POA: Insufficient documentation

## 2010-12-13 DIAGNOSIS — F17200 Nicotine dependence, unspecified, uncomplicated: Secondary | ICD-10-CM | POA: Insufficient documentation

## 2010-12-13 DIAGNOSIS — M25569 Pain in unspecified knee: Secondary | ICD-10-CM | POA: Insufficient documentation

## 2010-12-31 LAB — COMPREHENSIVE METABOLIC PANEL
ALT: 110 — ABNORMAL HIGH
AST: 44 — ABNORMAL HIGH
Albumin: 3.3 — ABNORMAL LOW
Alkaline Phosphatase: 37 — ABNORMAL LOW
BUN: 28 — ABNORMAL HIGH
CO2: 29
Calcium: 8.9
Chloride: 97
Creatinine, Ser: 1.87 — ABNORMAL HIGH
GFR calc Af Amer: 47 — ABNORMAL LOW
GFR calc non Af Amer: 39 — ABNORMAL LOW
Glucose, Bld: 93
Potassium: 3.9
Sodium: 134 — ABNORMAL LOW
Total Bilirubin: 1.1
Total Protein: 5.3 — ABNORMAL LOW

## 2010-12-31 LAB — URINE CULTURE
Colony Count: NO GROWTH
Culture: NO GROWTH

## 2010-12-31 LAB — CBC
HCT: 41.1
Hemoglobin: 13.7
MCHC: 33.2
MCV: 81.9
Platelets: 142 — ABNORMAL LOW
RBC: 5.01
RDW: 17 — ABNORMAL HIGH
WBC: 11.9 — ABNORMAL HIGH

## 2010-12-31 LAB — URINALYSIS, ROUTINE W REFLEX MICROSCOPIC
Bilirubin Urine: NEGATIVE
Glucose, UA: NEGATIVE
Ketones, ur: NEGATIVE
Leukocytes, UA: NEGATIVE
Nitrite: NEGATIVE
Protein, ur: 30 — AB
Specific Gravity, Urine: 1.02
Urobilinogen, UA: 1
pH: 6.5

## 2010-12-31 LAB — URINE MICROSCOPIC-ADD ON

## 2010-12-31 LAB — DIFFERENTIAL
Basophils Absolute: 0.3 — ABNORMAL HIGH
Basophils Relative: 3 — ABNORMAL HIGH
Eosinophils Absolute: 0.1
Eosinophils Relative: 1
Lymphocytes Relative: 22
Lymphs Abs: 2.6
Monocytes Absolute: 0.4
Monocytes Relative: 4
Neutro Abs: 8.6 — ABNORMAL HIGH
Neutrophils Relative %: 72

## 2011-01-11 ENCOUNTER — Emergency Department (HOSPITAL_COMMUNITY)
Admission: EM | Admit: 2011-01-11 | Discharge: 2011-01-11 | Disposition: A | Discharge status: Home / Self Care | Payer: Self-pay | Attending: Emergency Medicine | Admitting: Emergency Medicine

## 2011-01-11 DIAGNOSIS — M25569 Pain in unspecified knee: Secondary | ICD-10-CM | POA: Insufficient documentation

## 2011-01-11 DIAGNOSIS — M25469 Effusion, unspecified knee: Secondary | ICD-10-CM | POA: Insufficient documentation

## 2011-01-11 DIAGNOSIS — I10 Essential (primary) hypertension: Secondary | ICD-10-CM | POA: Insufficient documentation

## 2011-01-11 DIAGNOSIS — Z8639 Personal history of other endocrine, nutritional and metabolic disease: Secondary | ICD-10-CM | POA: Insufficient documentation

## 2011-01-11 DIAGNOSIS — Z862 Personal history of diseases of the blood and blood-forming organs and certain disorders involving the immune mechanism: Secondary | ICD-10-CM | POA: Insufficient documentation

## 2011-01-11 DIAGNOSIS — Z79899 Other long term (current) drug therapy: Secondary | ICD-10-CM | POA: Insufficient documentation

## 2011-01-11 DIAGNOSIS — E7800 Pure hypercholesterolemia, unspecified: Secondary | ICD-10-CM | POA: Insufficient documentation

## 2011-06-17 ENCOUNTER — Encounter: Payer: Self-pay | Admitting: Gastroenterology

## 2011-11-22 ENCOUNTER — Emergency Department (HOSPITAL_COMMUNITY)
Admission: EM | Admit: 2011-11-22 | Discharge: 2011-11-22 | Disposition: A | Discharge status: Home / Self Care | Payer: Self-pay | Attending: Emergency Medicine | Admitting: Emergency Medicine

## 2011-11-22 ENCOUNTER — Encounter (HOSPITAL_COMMUNITY): Payer: Self-pay | Admitting: *Deleted

## 2011-11-22 DIAGNOSIS — M7989 Other specified soft tissue disorders: Secondary | ICD-10-CM | POA: Insufficient documentation

## 2011-11-22 DIAGNOSIS — M109 Gout, unspecified: Secondary | ICD-10-CM | POA: Insufficient documentation

## 2011-11-22 DIAGNOSIS — R509 Fever, unspecified: Secondary | ICD-10-CM | POA: Insufficient documentation

## 2011-11-22 NOTE — ED Notes (Signed)
Pt states he does not longer want to wait, pt encouraged to stay.

## 2011-11-22 NOTE — ED Notes (Addendum)
Pt and family updated on poc and wait time. No needs at this time.

## 2011-11-22 NOTE — ED Notes (Signed)
Pt reports 3 day hx of increasing swelling to left knee. Reports hx of gout but large of swelling noted to left knee. Mild fever noted in triage. Knee warm to touch. Denies recent surgeries.

## 2011-12-11 ENCOUNTER — Observation Stay (HOSPITAL_COMMUNITY)
Admission: EM | Admit: 2011-12-11 | Discharge: 2011-12-13 | DRG: 313 | Disposition: A | Discharge status: Home / Self Care | Payer: 59 | Attending: Internal Medicine | Admitting: Internal Medicine

## 2011-12-11 ENCOUNTER — Encounter (HOSPITAL_COMMUNITY): Payer: Self-pay | Admitting: Emergency Medicine

## 2011-12-11 ENCOUNTER — Emergency Department (HOSPITAL_COMMUNITY): Payer: Self-pay

## 2011-12-11 ENCOUNTER — Other Ambulatory Visit: Payer: Self-pay

## 2011-12-11 DIAGNOSIS — Z6836 Body mass index (BMI) 36.0-36.9, adult: Secondary | ICD-10-CM

## 2011-12-11 DIAGNOSIS — F17200 Nicotine dependence, unspecified, uncomplicated: Secondary | ICD-10-CM | POA: Diagnosis present

## 2011-12-11 DIAGNOSIS — G8929 Other chronic pain: Secondary | ICD-10-CM | POA: Diagnosis present

## 2011-12-11 DIAGNOSIS — M109 Gout, unspecified: Secondary | ICD-10-CM | POA: Diagnosis present

## 2011-12-11 DIAGNOSIS — J441 Chronic obstructive pulmonary disease with (acute) exacerbation: Secondary | ICD-10-CM | POA: Diagnosis present

## 2011-12-11 DIAGNOSIS — R609 Edema, unspecified: Secondary | ICD-10-CM | POA: Diagnosis present

## 2011-12-11 DIAGNOSIS — R0600 Dyspnea, unspecified: Secondary | ICD-10-CM | POA: Diagnosis present

## 2011-12-11 DIAGNOSIS — E876 Hypokalemia: Secondary | ICD-10-CM | POA: Diagnosis present

## 2011-12-11 DIAGNOSIS — R209 Unspecified disturbances of skin sensation: Secondary | ICD-10-CM | POA: Diagnosis present

## 2011-12-11 DIAGNOSIS — N179 Acute kidney failure, unspecified: Secondary | ICD-10-CM | POA: Diagnosis present

## 2011-12-11 DIAGNOSIS — M549 Dorsalgia, unspecified: Secondary | ICD-10-CM

## 2011-12-11 DIAGNOSIS — E119 Type 2 diabetes mellitus without complications: Secondary | ICD-10-CM | POA: Diagnosis present

## 2011-12-11 DIAGNOSIS — R739 Hyperglycemia, unspecified: Secondary | ICD-10-CM | POA: Diagnosis present

## 2011-12-11 DIAGNOSIS — R079 Chest pain, unspecified: Secondary | ICD-10-CM | POA: Diagnosis present

## 2011-12-11 DIAGNOSIS — M5126 Other intervertebral disc displacement, lumbar region: Secondary | ICD-10-CM

## 2011-12-11 DIAGNOSIS — I10 Essential (primary) hypertension: Secondary | ICD-10-CM

## 2011-12-11 DIAGNOSIS — E785 Hyperlipidemia, unspecified: Secondary | ICD-10-CM | POA: Diagnosis present

## 2011-12-11 DIAGNOSIS — R0789 Other chest pain: Principal | ICD-10-CM | POA: Diagnosis present

## 2011-12-11 DIAGNOSIS — Z79899 Other long term (current) drug therapy: Secondary | ICD-10-CM

## 2011-12-11 DIAGNOSIS — N189 Chronic kidney disease, unspecified: Secondary | ICD-10-CM | POA: Diagnosis present

## 2011-12-11 DIAGNOSIS — R0602 Shortness of breath: Secondary | ICD-10-CM | POA: Diagnosis present

## 2011-12-11 DIAGNOSIS — R072 Precordial pain: Secondary | ICD-10-CM

## 2011-12-11 DIAGNOSIS — I872 Venous insufficiency (chronic) (peripheral): Secondary | ICD-10-CM | POA: Diagnosis present

## 2011-12-11 DIAGNOSIS — I129 Hypertensive chronic kidney disease with stage 1 through stage 4 chronic kidney disease, or unspecified chronic kidney disease: Secondary | ICD-10-CM | POA: Diagnosis present

## 2011-12-11 HISTORY — DX: Gout, unspecified: M10.9

## 2011-12-11 HISTORY — DX: Essential (primary) hypertension: I10

## 2011-12-11 LAB — BASIC METABOLIC PANEL
BUN: 41 mg/dL — ABNORMAL HIGH (ref 6–23)
CO2: 26 mEq/L (ref 19–32)
Calcium: 10.1 mg/dL (ref 8.4–10.5)
Chloride: 99 mEq/L (ref 96–112)
Creatinine, Ser: 3.46 mg/dL — ABNORMAL HIGH (ref 0.50–1.35)
GFR calc Af Amer: 22 mL/min — ABNORMAL LOW (ref >90)
GFR calc non Af Amer: 19 mL/min — ABNORMAL LOW (ref >90)
Glucose, Bld: 129 mg/dL — ABNORMAL HIGH (ref 70–99)
Potassium: 3.1 mEq/L — ABNORMAL LOW (ref 3.5–5.1)
Sodium: 139 mEq/L (ref 135–145)

## 2011-12-11 LAB — CBC
HCT: 42.8 % (ref 39.0–52.0)
Hemoglobin: 14 g/dL (ref 13.0–17.0)
MCH: 27.1 pg (ref 26.0–34.0)
MCHC: 32.7 g/dL (ref 30.0–36.0)
MCV: 82.8 fL (ref 78.0–100.0)
Platelets: 229 10*3/uL (ref 150–400)
RBC: 5.17 MIL/uL (ref 4.22–5.81)
RDW: 14.2 % (ref 11.5–15.5)
WBC: 8 10*3/uL (ref 4.0–10.5)

## 2011-12-11 LAB — PRO B NATRIURETIC PEPTIDE: Pro B Natriuretic peptide (BNP): 1310 pg/mL — ABNORMAL HIGH (ref 0–125)

## 2011-12-11 LAB — TROPONIN I
Troponin I: <0.3 ng/mL (ref <0.30)
Troponin I: <0.3 ng/mL (ref <0.30)

## 2011-12-11 LAB — POCT I-STAT TROPONIN I: Troponin i, poc: 0.03 ng/mL (ref 0.00–0.08)

## 2011-12-11 MED ORDER — SODIUM CHLORIDE 0.9 % IV SOLN: 250.0000 mL | INTRAVENOUS | Status: DC | PRN

## 2011-12-11 MED ORDER — ONDANSETRON HCL 4 MG/2ML IJ SOLN: 4.0000 mg | Freq: Three times a day (TID) | INTRAMUSCULAR | Status: DC | PRN

## 2011-12-11 MED ORDER — SODIUM CHLORIDE 0.9 % IJ SOLN
3.0000 mL | Freq: Two times a day (BID) | INTRAMUSCULAR | Status: DC
  Administered 2011-12-11: 3 mL via INTRAVENOUS

## 2011-12-11 MED ORDER — ONDANSETRON HCL 4 MG/2ML IJ SOLN: 4.0000 mg | Freq: Four times a day (QID) | INTRAMUSCULAR | Status: DC | PRN

## 2011-12-11 MED ORDER — ACETAMINOPHEN 325 MG PO TABS: 650.0000 mg | ORAL_TABLET | Freq: Four times a day (QID) | ORAL | Status: DC | PRN

## 2011-12-11 MED ORDER — HEPARIN SODIUM (PORCINE) 5000 UNIT/ML IJ SOLN
5000.0000 [IU] | Freq: Three times a day (TID) | INTRAMUSCULAR | Status: DC
  Administered 2011-12-11 – 2011-12-13 (×5): 5000 [IU] via SUBCUTANEOUS
  Filled 2011-12-11 (×8): qty 1

## 2011-12-11 MED ORDER — METOPROLOL TARTRATE 25 MG PO TABS
25.0000 mg | ORAL_TABLET | Freq: Two times a day (BID) | ORAL | Status: DC
  Administered 2011-12-11 – 2011-12-13 (×4): 25 mg via ORAL
  Filled 2011-12-11 (×5): qty 1

## 2011-12-11 MED ORDER — HYDROMORPHONE HCL PF 1 MG/ML IJ SOLN: 0.5000 mg | INTRAMUSCULAR | Status: DC | PRN

## 2011-12-11 MED ORDER — HYDROCODONE-ACETAMINOPHEN 5-325 MG PO TABS
1.0000 | ORAL_TABLET | ORAL | Status: DC | PRN
  Administered 2011-12-12 (×2): 1 via ORAL
  Filled 2011-12-11 (×2): qty 1

## 2011-12-11 MED ORDER — POTASSIUM CHLORIDE CRYS ER 20 MEQ PO TBCR
40.0000 meq | EXTENDED_RELEASE_TABLET | Freq: Once | ORAL | Status: AC
  Administered 2011-12-11: 40 meq via ORAL

## 2011-12-11 MED ORDER — ACETAMINOPHEN 650 MG RE SUPP: 650.0000 mg | Freq: Four times a day (QID) | RECTAL | Status: DC | PRN

## 2011-12-11 MED ORDER — SENNA 8.6 MG PO TABS
1.0000 | ORAL_TABLET | Freq: Two times a day (BID) | ORAL | Status: DC
  Administered 2011-12-11 – 2011-12-12 (×3): 8.6 mg via ORAL
  Filled 2011-12-11 (×3): qty 1

## 2011-12-11 MED ORDER — SODIUM CHLORIDE 0.9 % IJ SOLN: 3.0000 mL | INTRAMUSCULAR | Status: DC | PRN

## 2011-12-11 MED ORDER — ONDANSETRON HCL 4 MG PO TABS: 4.0000 mg | ORAL_TABLET | Freq: Four times a day (QID) | ORAL | Status: DC | PRN

## 2011-12-11 MED ORDER — SODIUM CHLORIDE 0.9 % IJ SOLN
3.0000 mL | Freq: Two times a day (BID) | INTRAMUSCULAR | Status: DC
  Administered 2011-12-12 (×2): 3 mL via INTRAVENOUS

## 2011-12-11 MED ORDER — HYDRALAZINE HCL 25 MG PO TABS
25.0000 mg | ORAL_TABLET | Freq: Four times a day (QID) | ORAL | Status: DC
  Administered 2011-12-11 – 2011-12-12 (×3): 25 mg via ORAL
  Filled 2011-12-11 (×8): qty 1

## 2011-12-11 MED ORDER — ALBUTEROL SULFATE (5 MG/ML) 0.5% IN NEBU: 2.5000 mg | INHALATION_SOLUTION | RESPIRATORY_TRACT | Status: DC | PRN

## 2011-12-11 NOTE — Progress Notes (Signed)
Kieshawn Supan, is a 51 y.o. male,   MRN: [id number]  -  DOB - February 21, 1961  Outpatient Primary MD for the patient is TODD,JEFFREY Zenia Resides, MD  in for    Chief Complaint  Patient presents with  . Chest Pain     Blood pressure 160/84, pulse 83, temperature 97.7 F (36.5 C), temperature source Oral, resp. rate 20, SpO2 93.00%.  Principal Problem:  *Chest pain Active Problems:  HYPERLIPIDEMIA  GOUT  Dyspnea  Acute on chronic renal failure  Hypokalemia  HTN (hypertension)   Very pleasant 51 yo hx HTN, renal insuff, obesity, gout, chronic back pain and smoking who presents to ED with new exertional chest tightness and dyspnea. He notes that prior to 3 days ago he had no similar prior events. Reports that after ascending 15 steps he felt dyspneic, with diffuse chest tightness. This occurred again following similar exertion over the past 2 days. On arrival the patient was mild chest discomfort, no dyspnea, no focal pain, no other focal complaints. Denies fever, chills, cough, nausea, diarrhea. No recent illness. States he did experience some dizziness but no headache, visual disturbances. Symptoms improved at rest for at least 20 minutes.  Also reports that LEE during this time that he reports is new. Denies orthopnea. States that has not seen Dr todd in several years but is getting meds from "[facility]" and is compliant with antihypertensives.   Work up in ed yields trop 0.03, pot 3.1, creatinine 3.4 chart review indicates baseline 2. BNP 1310 EKG without signs ischemia, chest xray Edema or consolidation.   On exam VSS BS a little distant but no rhonchi, wheeze rales. Non toxic appearing.   Given risk factors will admit to rule out. Likely need echo as well.

## 2011-12-11 NOTE — ED Notes (Signed)
Reported to Hazel, RN on [place].  She states the room is being cleaned by pt can come up by 4pm. Pt will be updated.

## 2011-12-11 NOTE — ED Provider Notes (Signed)
History     CSN: [id number]  Arrival date & time 12/11/11  1317   First MD Initiated Contact with Patient 12/11/11 1341      Chief Complaint  Patient presents with  . Chest Pain    HPI  The patient presents with new exertional chest tightness and dyspnea.  He notes that prior to 3 days ago he had no similar prior events.  Please go after ascending 15 steps he felt dyspneic, with diffuse chest tightness.  This occurred again following similar exertion over the past 2 days.  On arrival the patient was mild chest discomfort, no dyspnea, no focal pain, no other focal complaints. Symptoms improved at rest for at least 20 minutes. No associated fevers, chills, cough, headache, lightheadedness, syncope.  Past Medical History  Diagnosis Date  . Hypertension   . Gout     Past Surgical History  Procedure Date  . Cholecystectomy     No family history on file.  History  Substance Use Topics  . Smoking status: Current Every Day Smoker -- 0.5 packs/day    Types: Cigarettes  . Smokeless tobacco: Not on file  . Alcohol Use: No      Review of Systems  Constitutional:       Per HPI, otherwise negative  HENT:       Per HPI, otherwise negative  Eyes: Negative.   Respiratory:       Per HPI, otherwise negative  Cardiovascular:       Per HPI, otherwise negative  Gastrointestinal: Negative for vomiting.  Genitourinary: Negative.   Musculoskeletal:       Per HPI, otherwise negative  Skin: Negative.   Neurological: Negative for syncope.    Allergies  Review of patient's allergies indicates no known allergies.  Home Medications   Current Outpatient Rx  Name Route Sig Dispense Refill  . ALLOPURINOL 100 MG PO TABS Oral Take 200 mg by mouth daily.    . ATENOLOL-CHLORTHALIDONE 50-25 MG PO TABS Oral Take 1 tablet by mouth daily.      BP 160/84  Pulse 83  Temp 97.7 F (36.5 C) (Oral)  Resp 20  SpO2 93%  Physical Exam  Nursing note and vitals reviewed. Constitutional: He  is oriented to person, place, and time. He appears well-developed. No distress.  HENT:  Head: Normocephalic and atraumatic.  Eyes: Conjunctivae normal and EOM are normal.  Cardiovascular: Normal rate and regular rhythm.   Pulmonary/Chest: Effort normal. No stridor. No respiratory distress.  Abdominal: He exhibits no distension.  Musculoskeletal: He exhibits no edema.  Neurological: He is alert and oriented to person, place, and time.  Skin: Skin is warm and dry.  Psychiatric: He has a normal mood and affect.    ED Course  Procedures (including critical care time)   Labs Reviewed  CBC  BASIC METABOLIC PANEL  PRO B NATRIURETIC PEPTIDE   No results found.   No diagnosis found.  Cardiac: 85sr, normal  O2: 99%ra, normal   Date: 12/11/2011  Rate: 79  Rhythm: normal sinus rhythm  QRS Axis: normal  Intervals: normal  ST/T Wave abnormalities: nonspecific T wave changes  Conduction Disutrbances:nonspecific intraventricular conduction delay  Narrative Interpretation:   Old EKG Reviewed: none available BORDERLINE   MDM  This previously well male presents with new dyspnea on exertion and chest tightness.  On arrival here he is asymptomatic.  However, given the patient's description of new chest pain and dyspnea on exertion or suspicion for new angina versus renal  dysfunction versus heart failure.  The patient's physical exam is largely reassuring, but his labs are notable for progression of renal dysfunction and elevated BNP given these findings the patient will be admitted for further evaluation and management.  Carmin Muskrat, MD 12/11/11 1517

## 2011-12-11 NOTE — ED Notes (Signed)
Pt presenting to ed with c/o chest pain and shortness of breath x 3 days pt states it's hard for him to breath when he walks. Pt states no nausea and vomiting at this time.

## 2011-12-11 NOTE — H&P (Signed)
[facility] History and Physical  Craig Flynn [id number] DOB: Mar 01, 1961 DOA: 12/11/2011  Referring physician: ER PCP: Joycelyn Man, MD   Chief Complaint: SOB  HPI: Craig Flynn is a 51 y.o. male  Who is a very pleasant 51 yo hx HTN, renal insuff, obesity, gout, chronic back pain and smoking who presents to ED with new exertional chest tightness and dyspnea. He reports that after ascending 15 steps he felt dyspneic, with diffuse chest tightness. This has occurred again following similar exertion over the past 2 days. On arrival the patient was mild chest discomfort, no dyspnea, no focal pain, no other focal complaints. Denies fever, chills, cough, nausea, diarrhea. No recent illness. States he did experience some dizziness but no headache, visual disturbances. Symptoms improved at rest for at least 20 minutes. Also reports that he has been having edema in his lower extremities during this time that he reports is new. Denies orthopnea. States that has not seen Dr todd in several years but is getting meds from "[facility]" and is compliant with antihypertensives.    Review of Systems: all systems reviewed, negative unless stated above  Past Medical History  Diagnosis Date  . Hypertension   . Gout   . 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 Cholecystectomy   . Colostomy    Social History:  reports that he has been smoking Cigarettes.  He has a 15 pack-year smoking history. He has never used smokeless tobacco. He reports that he does not drink alcohol or use illicit drugs.  No Known Allergies  Family Hx: +CAD  Prior to Admission medications   Medication Sig Start Date End Date Taking? Authorizing Provider  allopurinol (ZYLOPRIM) 100 MG tablet Take 200 mg by mouth daily.   Yes Historical Provider, MD  atenolol-chlorthalidone (TENORETIC) 50-25 MG per tablet Take  1 tablet by mouth daily.   Yes Historical Provider, MD   Physical Exam: Filed Vitals:   12/11/11 1322  BP: 160/84  Pulse: 83  Temp: 97.7 F (36.5 C)  TempSrc: Oral  Resp: 20  SpO2: 93%     General:  A+Ox3, pleasant  Eyes: some eye drooping left eye  ENT: wnl  Neck: supple, no JVD  Cardiovascular: rrr  Respiratory: dec BS b/l  Abdomen: +BS, obese  Skin: no rashes or lesions  Musculoskeletal: moves all 4 extremities equally  Psychiatric: no SI/no HI  Neurologic: no focal new deficits  Labs on Admission:  Basic Metabolic Panel:  Lab [id number] 1400  NA 139  K 3.1*  CL 99  CO2 26  GLUCOSE 129*  BUN 41*  CREATININE 3.46*  CALCIUM 10.1  MG --  PHOS --   Liver Function Tests: No results found for this basename: AST:5,ALT:5,ALKPHOS:5,BILITOT:5,PROT:5,ALBUMIN:5 in the last 168 hours No results found for this basename: LIPASE:5,AMYLASE:5 in the last 168 hours No results found for this basename: AMMONIA:5 in the last 168 hours CBC:  Lab 12/11/11 1400  WBC 8.0  NEUTROABS --  HGB 14.0  HCT 42.8  MCV 82.8  PLT 229   Cardiac Enzymes: No results found for this basename: CKTOTAL:5,CKMB:5,CKMBINDEX:5,TROPONINI:5 in the last 168 hours  BNP (last 3 results)  Basename 12/11/11 1400  PROBNP 1310.0*   CBG: No results found for this basename: GLUCAP:5 in the last 168 hours  Radiological Exams on Admission: Dg Chest 2 View  12/11/2011  *RADIOLOGY  REPORT*  Clinical Data: Chest pain and shortness of breath  CHEST - 2 VIEW  Comparison: December 18, 2003  Findings: Lungs are clear.  Heart size and pulmonary vascularity are normal.  No adenopathy.  There is lower thoracic levoscoliosis, mild.  IMPRESSION: Edema or consolidation.   Original Report Authenticated By: Margit Hanks. WOODRUFF III, M.D.     EKG: Independently reviewed. NSR  Assessment/Plan Principal Problem:  *Chest pain Active Problems:  HYPERLIPIDEMIA  GOUT  Dyspnea  Acute on chronic renal  failure  Hypokalemia  HTN (hypertension)  SOB (shortness of breath)   1. SOB with exertion/CP- echo, CE, BNP mildly elevated but would expect it to be higher with renal failure if truly fluid overload, will get cardiology if necessary 2. Hypokalemia- repleat 3. AKI on CKD- this very well could be his new "normal"- last Cr was from 3 years ago and he had had uncontrolled HTN since then 4. H/o gout 5. HTN- needs better control, check UDS- denied cocaine use, add hydralazine and BB- titrate as tolerated, hold ACE and diuretic due to kidney function 6. Tobacco abuse- encourage cessation  Code Status: full Family Communication: patient at bedside Disposition Plan: home when better  Time spent: > 70 min  Kendal Raffo [facility] Pager [phone]  If 7PM-7AM, please contact night-coverage [email or web address] Password [facility] 12/11/2011, 3:51 PM

## 2011-12-12 DIAGNOSIS — R739 Hyperglycemia, unspecified: Secondary | ICD-10-CM | POA: Diagnosis present

## 2011-12-12 DIAGNOSIS — E119 Type 2 diabetes mellitus without complications: Secondary | ICD-10-CM | POA: Diagnosis present

## 2011-12-12 DIAGNOSIS — E876 Hypokalemia: Secondary | ICD-10-CM

## 2011-12-12 DIAGNOSIS — R0602 Shortness of breath: Secondary | ICD-10-CM

## 2011-12-12 LAB — GLUCOSE, CAPILLARY
Glucose-Capillary: 89 mg/dL (ref 70–99)
Glucose-Capillary: 95 mg/dL (ref 70–99)
Glucose-Capillary: 116 mg/dL — ABNORMAL HIGH (ref 70–99)

## 2011-12-12 LAB — BASIC METABOLIC PANEL
BUN: 41 mg/dL — ABNORMAL HIGH (ref 6–23)
CO2: 23 mEq/L (ref 19–32)
Calcium: 9.1 mg/dL (ref 8.4–10.5)
Chloride: 100 mEq/L (ref 96–112)
Creatinine, Ser: 3.36 mg/dL — ABNORMAL HIGH (ref 0.50–1.35)
GFR calc Af Amer: 23 mL/min — ABNORMAL LOW (ref >90)
GFR calc non Af Amer: 20 mL/min — ABNORMAL LOW (ref >90)
Glucose, Bld: 148 mg/dL — ABNORMAL HIGH (ref 70–99)
Potassium: 2.9 mEq/L — ABNORMAL LOW (ref 3.5–5.1)
Sodium: 136 mEq/L (ref 135–145)

## 2011-12-12 LAB — CBC
HCT: 37.5 % — ABNORMAL LOW (ref 39.0–52.0)
Hemoglobin: 12.2 g/dL — ABNORMAL LOW (ref 13.0–17.0)
MCH: 26.9 pg (ref 26.0–34.0)
MCHC: 32.5 g/dL (ref 30.0–36.0)
MCV: 82.6 fL (ref 78.0–100.0)
Platelets: 204 10*3/uL (ref 150–400)
RBC: 4.54 MIL/uL (ref 4.22–5.81)
RDW: 14.2 % (ref 11.5–15.5)
WBC: 7 10*3/uL (ref 4.0–10.5)

## 2011-12-12 LAB — HEMOGLOBIN A1C
Hgb A1c MFr Bld: 7.1 % — ABNORMAL HIGH (ref <5.7)
Mean Plasma Glucose: 157 mg/dL — ABNORMAL HIGH (ref <117)

## 2011-12-12 LAB — TSH: TSH: 1.663 u[IU]/mL (ref 0.350–4.500)

## 2011-12-12 LAB — TROPONIN I: Troponin I: <0.3 ng/mL (ref <0.30)

## 2011-12-12 MED ORDER — INSULIN ASPART 100 UNIT/ML ~~LOC~~ SOLN: 0.0000 [IU] | Freq: Every day | SUBCUTANEOUS | Status: DC

## 2011-12-12 MED ORDER — FUROSEMIDE 40 MG PO TABS
40.0000 mg | ORAL_TABLET | Freq: Once | ORAL | Status: AC
  Administered 2011-12-12: 40 mg via ORAL
  Filled 2011-12-12: qty 1

## 2011-12-12 MED ORDER — LEVOFLOXACIN 750 MG PO TABS
750.0000 mg | ORAL_TABLET | ORAL | Status: DC
  Administered 2011-12-12: 750 mg via ORAL
  Filled 2011-12-12 (×2): qty 1

## 2011-12-12 MED ORDER — HYDRALAZINE HCL 10 MG PO TABS
10.0000 mg | ORAL_TABLET | Freq: Four times a day (QID) | ORAL | Status: DC
  Filled 2011-12-12 (×4): qty 1

## 2011-12-12 MED ORDER — POTASSIUM CHLORIDE CRYS ER 20 MEQ PO TBCR
40.0000 meq | EXTENDED_RELEASE_TABLET | Freq: Three times a day (TID) | ORAL | Status: AC
  Administered 2011-12-12 (×3): 40 meq via ORAL
  Filled 2011-12-12 (×3): qty 2

## 2011-12-12 MED ORDER — INSULIN ASPART 100 UNIT/ML ~~LOC~~ SOLN: 0.0000 [IU] | Freq: Three times a day (TID) | SUBCUTANEOUS | Status: DC

## 2011-12-12 NOTE — Progress Notes (Signed)
Cm spoke with pt concerning dc planning. Per pt unemployed, denied Medicaid approval. For PCP is seen at [facility]. Per pharmacy pt qualifies for indigent funds if meds applicable. Pt explained medication assistance program at [place]. Pt states most med already generic. Pt provided with packet of community resources. No other needs specified at this time.    Arlean Hopping [phone]

## 2011-12-12 NOTE — Progress Notes (Signed)
[facility] PROGRESS NOTE  VERNICE NOTHDURFT [id number] DOB: May 18, 1960 DOA: 12/11/2011 PCP: Pcp Not In System  Assessment/Plan: Principal Problem:  *Chest pain Active Problems:  HYPERLIPIDEMIA  GOUT  Dyspnea  Acute on chronic renal failure  Hypokalemia  HTN (hypertension)  SOB (shortness of breath)  Diabetes mellitus  1. Diabetes (new diagnosis)- add carb modified diet, add SSI, no metformin due to renal issues, will need oral agent started if patient cannot control with diet 2. SOB- had low grade fever last PM, appears to be COPD exacerbation, has 35 + year smoking hx 3. HTN- add BB- BP low will take off hydralazine and just leave metoprolol 4. CKD- appears volume overloaded, give lasix x 1 and monitor output, await U/A 5. Morbid obesity- encourage weight loss 6. Numbness in legs- could be from DM vs obesity  Code Status: full Family Communication: patient at bedside Disposition Plan: home when better- hope for tomm    HPI/Subjective: Had sharp pain in right chest with deep breathing C/o some numbness in legs   Objective: Filed Vitals:   12/11/11 2133 12/12/11 0023 12/12/11 0534 12/12/11 0550  BP: 119/63 124/66 119/64 108/54  Pulse: 75   81  Temp:    99.1 F (37.3 C)  TempSrc:    Oral  Resp:    20  Height:      Weight:      SpO2:    99%    Intake/Output Summary (Last 24 hours) at 12/12/11 1046 Last data filed at 12/12/11 1014  Gross per 24 hour  Intake   1180 ml  Output    175 ml  Net   1005 ml   Filed Weights   12/11/11 1625  Weight: 129.7 kg (285 lb 15 oz)    Exam:   General:  A+Ox3, NAD  Cardiovascular: rrr  Respiratory: clear anterior  Abdomen: +BS, soft, obese  Ext: + edema  Data Reviewed: Basic Metabolic Panel:  Lab [id number] 0439 12/11/11 1400  NA 136 139  K 2.9* 3.1*  CL 100 99  CO2 23 26  GLUCOSE 148* 129*  BUN 41* 41*  CREATININE 3.36* 3.46*  CALCIUM 9.1 10.1  MG -- --  PHOS -- --   Liver Function Tests: No  results found for this basename: AST:5,ALT:5,ALKPHOS:5,BILITOT:5,PROT:5,ALBUMIN:5 in the last 168 hours No results found for this basename: LIPASE:5,AMYLASE:5 in the last 168 hours No results found for this basename: AMMONIA:5 in the last 168 hours CBC:  Lab 12/12/11 0439 12/11/11 1400  WBC 7.0 8.0  NEUTROABS -- --  HGB 12.2* 14.0  HCT 37.5* 42.8  MCV 82.6 82.8  PLT 204 229   Cardiac Enzymes:  Lab 12/12/11 0439 12/11/11 2240 12/11/11 1724  CKTOTAL -- -- --  CKMB -- -- --  CKMBINDEX -- -- --  TROPONINI <0.30 <0.30 <0.30   BNP (last 3 results)  Basename 12/11/11 1400  PROBNP 1310.0*   CBG: No results found for this basename: GLUCAP:5 in the last 168 hours  No results found for this or any previous visit (from the past 240 hour(s)).   Studies: Dg Chest 2 View  12/11/2011  *RADIOLOGY REPORT*  Clinical Data: Chest pain and shortness of breath  CHEST - 2 VIEW  Comparison: December 18, 2003  Findings: Lungs are clear.  Heart size and pulmonary vascularity are normal.  No adenopathy.  There is lower thoracic levoscoliosis, mild.  IMPRESSION: Edema or consolidation.   Original Report Authenticated By: Margit Hanks. WOODRUFF III, M.D.  Scheduled Meds:    . furosemide  40 mg Oral Once  . heparin  5,000 Units Subcutaneous Q8H  . levofloxacin  750 mg Oral Q48H  . metoprolol tartrate  25 mg Oral BID  . potassium chloride  40 mEq Oral Once  . senna  1 tablet Oral BID  . sodium chloride  3 mL Intravenous Q12H  . sodium chloride  3 mL Intravenous Q12H  . DISCONTD: hydrALAZINE  10 mg Oral Q6H  . DISCONTD: hydrALAZINE  25 mg Oral Q6H   Continuous Infusions:   Principal Problem:  *Chest pain Active Problems:  HYPERLIPIDEMIA  GOUT  Dyspnea  Acute on chronic renal failure  Hypokalemia  HTN (hypertension)  SOB (shortness of breath)  Diabetes mellitus    Time spent: 35 min    Eulogio Bear  [facility] Pager [phone] 12/12/2011, 10:46 AM  LOS: 1 day

## 2011-12-13 DIAGNOSIS — R0989 Other specified symptoms and signs involving the circulatory and respiratory systems: Secondary | ICD-10-CM

## 2011-12-13 DIAGNOSIS — R0609 Other forms of dyspnea: Secondary | ICD-10-CM

## 2011-12-13 LAB — BASIC METABOLIC PANEL
BUN: 44 mg/dL — ABNORMAL HIGH (ref 6–23)
CO2: 23 mEq/L (ref 19–32)
Calcium: 9.5 mg/dL (ref 8.4–10.5)
Chloride: 100 mEq/L (ref 96–112)
Creatinine, Ser: 3.61 mg/dL — ABNORMAL HIGH (ref 0.50–1.35)
GFR calc Af Amer: 21 mL/min — ABNORMAL LOW (ref >90)
GFR calc non Af Amer: 18 mL/min — ABNORMAL LOW (ref >90)
Glucose, Bld: 102 mg/dL — ABNORMAL HIGH (ref 70–99)
Potassium: 3.6 mEq/L (ref 3.5–5.1)
Sodium: 136 mEq/L (ref 135–145)

## 2011-12-13 LAB — CBC
HCT: 36.8 % — ABNORMAL LOW (ref 39.0–52.0)
Hemoglobin: 11.9 g/dL — ABNORMAL LOW (ref 13.0–17.0)
MCH: 26.7 pg (ref 26.0–34.0)
MCHC: 32.3 g/dL (ref 30.0–36.0)
MCV: 82.5 fL (ref 78.0–100.0)
Platelets: 163 10*3/uL (ref 150–400)
RBC: 4.46 MIL/uL (ref 4.22–5.81)
RDW: 14.5 % (ref 11.5–15.5)
WBC: 6.2 10*3/uL (ref 4.0–10.5)

## 2011-12-13 LAB — GLUCOSE, CAPILLARY
Glucose-Capillary: 104 mg/dL — ABNORMAL HIGH (ref 70–99)
Glucose-Capillary: 96 mg/dL (ref 70–99)

## 2011-12-13 MED ORDER — LEVOFLOXACIN 750 MG PO TABS: 750.0000 mg | ORAL_TABLET | ORAL | Status: DC

## 2011-12-13 MED ORDER — METOPROLOL TARTRATE 25 MG PO TABS: 25.0000 mg | ORAL_TABLET | Freq: Two times a day (BID) | ORAL | Status: DC

## 2011-12-13 MED ORDER — HYDROCODONE-ACETAMINOPHEN 5-325 MG PO TABS: 1.0000 | ORAL_TABLET | Freq: Three times a day (TID) | ORAL | Status: DC | PRN

## 2011-12-13 MED ORDER — ALLOPURINOL 100 MG PO TABS: 100.0000 mg | ORAL_TABLET | Freq: Every day | ORAL | Status: DC

## 2011-12-13 NOTE — Progress Notes (Signed)
Nutrition Brief Note  Patient identified on the Malnutrition Screening Tool (MST) report for weight loss of 15 pounds over 4 months, generating a score of 2.   Body mass index is 36.71 kg/(m^2). Pt meets criteria for obesity unspecified based on current BMI.   Current diet order is Low Sodium, heart healthy, patient is consuming approximately 100% of meals at this time. Labs and medications reviewed.   Patient reports he has actually gained some weight over the last few months due to more sedentary lifestyle. New diagnosis of diabetes, with attempts to control blood glucose with diet alone. Patient briefly educated on diabetes diet. We also discussed strategies for healthy weight loss. Handout provided.   If nutrition issues arise, please consult RD.   Larey Seat, RD, LDN Pager #: [phone] After-Hours Pager #: [phone]

## 2011-12-13 NOTE — Discharge Summary (Signed)
Physician Discharge Summary  Craig Flynn [id number] DOB: Mar 05, 1961 DOA: 12/11/2011  PCP: [facility]  Admit date: 12/11/2011 Discharge date: 12/13/2011  Recommendations for Outpatient Follow-up:  1. Close follow up with PCP for Cr- may need established with nephrology 2. Diabetic diet- if this does not help his HgbA1C then will need oral agent  Discharge Diagnoses:  Principal Problem:  *Chest pain Active Problems:  HYPERLIPIDEMIA  GOUT  Dyspnea  Acute on chronic renal failure  Hypokalemia  HTN (hypertension)  SOB (shortness of breath)  Diabetes mellitus   Discharge Condition: improved  Diet recommendation: cardiac/diabetic  Filed Weights   12/11/11 1625  Weight: 129.7 kg (285 lb 15 oz)    History of present illness:  Who is a very pleasant 51 yo hx HTN, renal insuff, obesity, gout, chronic back pain and smoking who presents to ED with new exertional chest tightness and dyspnea. He reports that after ascending 15 steps he felt dyspneic, with diffuse chest tightness. This has occurred again following similar exertion over the past 2 days. On arrival the patient was mild chest discomfort, no dyspnea, no focal pain, no other focal complaints. Denies fever, chills, cough, nausea, diarrhea. No recent illness. States he did experience some dizziness but no headache, visual disturbances. Symptoms improved at rest for at least 20 minutes. Also reports that he has been having edema in his lower extremities during this time that he reports is new. Denies orthopnea. States that has not seen Dr todd in several years but is getting meds from "[facility]" and is compliant with antihypertensives.   Hospital Course:  1. Diabetes (new diagnosis)- add carb modified diet, no metformin due to renal issues, will need oral agent started if patient cannot control with diet 2. SOB- had low grade fever last PM, appears to be COPD exacerbation, has 35 + year smoking hx, echo ok, BNP  low 3. HTN- add BB 4. CKD- this appears to be patient's new baseline- will need referral for nephrology if does not improve  5. Morbid obesity- encourage weight loss 6. LE edema- appears to have some venous insuff and chronic skin changes in left leg, no calf tenderness 7. H/o gout- renal dose medications   Discharge Exam: Filed Vitals:   12/12/11 0550 12/12/11 1426 12/12/11 2124 12/13/11 0606  BP: 108/54 141/69 137/71 125/70  Pulse: 81 80 85 82  Temp: 99.1 F (37.3 C) 99.1 F (37.3 C) 98.3 F (36.8 C) 98.1 F (36.7 C)  TempSrc: Oral Oral Oral Oral  Resp: 20 19 18 18   Height:      Weight:      SpO2: 99% 97% 100% 97%    General: A+Ox3, NAD Cardiovascular: rrr Respiratory: dec BS b/L, no wheezing Ext: + edema, L>R but no calf tenderness  Discharge Instructions  Discharge Orders    Future Orders Please Complete By Expires   Diet - low sodium heart healthy      Diet Carb Modified      Increase activity slowly      Discharge instructions      Comments:   Stop smoking Keep appointment with PCP this week Continue diabetic diet       Medication List     As of 12/13/2011 12:29 PM    STOP taking these medications         atenolol-chlorthalidone 50-25 MG per tablet   Commonly known as: TENORETIC      TAKE these medications  allopurinol 100 MG tablet   Commonly known as: ZYLOPRIM   Take 1 tablet (100 mg total) by mouth daily.      HYDROcodone-acetaminophen 5-325 MG per tablet   Commonly known as: NORCO/VICODIN   Take 1 tablet by mouth every 8 (eight) hours as needed.      levofloxacin 750 MG tablet   Commonly known as: LEVAQUIN   Take 1 tablet (750 mg total) by mouth every other day. First dose on Monday      metoprolol tartrate 25 MG tablet   Commonly known as: LOPRESSOR   Take 1 tablet (25 mg total) by mouth 2 (two) times daily.           Follow-up Information    Follow up with Pcp Not In System. [facility]- keep scheduled  appointment)           The results of significant diagnostics from this hospitalization (including imaging, microbiology, ancillary and laboratory) are listed below for reference.    Significant Diagnostic Studies: Dg Chest 2 View  12/11/2011  *RADIOLOGY REPORT*  Clinical Data: Chest pain and shortness of breath  CHEST - 2 VIEW  Comparison: December 18, 2003  Findings: Lungs are clear.  Heart size and pulmonary vascularity are normal.  No adenopathy.  There is lower thoracic levoscoliosis, mild.  IMPRESSION: Edema or consolidation.   Original Report Authenticated By: Margit Hanks. WOODRUFF III, M.D.    Echo Study Conclusions  - Left ventricle: The cavity size was normal. Wall thickness was increased in a pattern of mild LVH. The estimated ejection fraction was 55%. Although no diagnostic regional wall motion abnormality was identified, this possibility cannot be completely excluded on the basis of this study. Doppler parameters are consistent with abnormal left ventricular relaxation (grade 1 diastolic dysfunction). - Aortic valve: There was no stenosis. - Mitral valve: No significant regurgitation. - Right ventricle: The cavity size was normal. Systolic function was normal. - Pulmonary arteries: No complete TR doppler jet so unable to estimate PA systolic pressure. - Inferior vena cava: The vessel was normal in size; the respirophasic diameter changes were in the normal range (= 50%); findings are consistent with normal central venous pressure. Impressions:  - Technically difficult study with poor acoustic windows. Normal LV size with mild LV hypertrophy. EF 55%. Normal RV size and systolic function. No significant valvular abnormalities. Transthoracic echocardiography. M-mode, complete 2D, spectral Doppler, and color Doppler. Height: Height: 188cm. Height: 74in. Weight: Weight: 136.4kg. Weight: 300lb. Body mass index: BMI: 38.6kg/m^2. Body surface area: BSA: 2.106m^2. Blood  pressure: 160/84. Patient status: Inpatient. Location: Emergency department.  ------------------------------------------------------------  ------------------------------------------------------------ Left ventricle: The cavity size was normal. Wall thickness was increased in a pattern of mild LVH. The estimated ejection fraction was 55%. Although no diagnostic regional wall motion abnormality was identified, this possibility cannot be completely excluded on the basis of this study. Doppler parameters are consistent with abnormal left ventricular relaxation (grade 1 diastolic dysfunction).  ------------------------------------------------------------ Aortic valve: Trileaflet; mildly calcified leaflets. Doppler: There was no stenosis. No regurgitation.  ------------------------------------------------------------ Aorta: Aortic root: The aortic root was normal in size. Ascending aorta: The ascending aorta was normal in size.  ------------------------------------------------------------ Mitral valve: Doppler: There was no evidence for stenosis. No significant regurgitation.  ------------------------------------------------------------ Left atrium: The atrium was normal in size.  ------------------------------------------------------------ Right ventricle: The cavity size was normal. Systolic function was normal.  ------------------------------------------------------------ Pulmonic valve: Structurally normal valve. Cusp separation was normal. Doppler: Transvalvular velocity was within the normal range. No  regurgitation.  ------------------------------------------------------------ Tricuspid valve: Doppler: No significant regurgitation.  ------------------------------------------------------------ Pulmonary artery: No complete TR doppler jet so unable to estimate PA systolic pressure.  ------------------------------------------------------------ Right atrium: The atrium was  normal in size.  ------------------------------------------------------------ Pericardium: There was no pericardial effusion.     Microbiology: No results found for this or any previous visit (from the past 240 hour(s)).   Labs: Basic Metabolic Panel:  Lab 0000000 0525 12/12/11 0439 12/11/11 1400  NA 136 136 139  K 3.6 2.9* 3.1*  CL 100 100 99  CO2 23 23 26   GLUCOSE 102* 148* 129*  BUN 44* 41* 41*  CREATININE 3.61* 3.36* 3.46*  CALCIUM 9.5 9.1 10.1  MG -- -- --  PHOS -- -- --   Liver Function Tests: No results found for this basename: AST:5,ALT:5,ALKPHOS:5,BILITOT:5,PROT:5,ALBUMIN:5 in the last 168 hours No results found for this basename: LIPASE:5,AMYLASE:5 in the last 168 hours No results found for this basename: AMMONIA:5 in the last 168 hours CBC:  Lab 12/13/11 0525 12/12/11 0439 12/11/11 1400  WBC 6.2 7.0 8.0  NEUTROABS -- -- --  HGB 11.9* 12.2* 14.0  HCT 36.8* 37.5* 42.8  MCV 82.5 82.6 82.8  PLT 163 204 229   Cardiac Enzymes:  Lab 12/12/11 0439 12/11/11 2240 12/11/11 1724  CKTOTAL -- -- --  CKMB -- -- --  CKMBINDEX -- -- --  TROPONINI <0.30 <0.30 <0.30   BNP: BNP (last 3 results)  Basename 12/11/11 1400  PROBNP 1310.0*   CBG:  Lab 12/13/11 1154 12/13/11 0722 12/12/11 2121 12/12/11 1629 12/12/11 1142  GLUCAP 96 104* 116* 95 89    Time coordinating discharge: 35 minutes  Signed:  Eliseo Squires, Yarel Rushlow  [facility] 12/13/2011, 12:29 PM

## 2012-06-24 ENCOUNTER — Encounter: Payer: Self-pay | Admitting: Gastroenterology

## 2013-11-16 ENCOUNTER — Encounter (HOSPITAL_COMMUNITY): Payer: Self-pay | Admitting: Emergency Medicine

## 2013-11-16 ENCOUNTER — Emergency Department (HOSPITAL_COMMUNITY): Payer: BC Managed Care – PPO

## 2013-11-16 ENCOUNTER — Inpatient Hospital Stay (HOSPITAL_COMMUNITY)
Admission: EM | Admit: 2013-11-16 | Discharge: 2013-11-19 | DRG: 292 | Disposition: A | Discharge status: Home / Self Care | Payer: BC Managed Care – PPO | Attending: Internal Medicine | Admitting: Internal Medicine

## 2013-11-16 DIAGNOSIS — K047 Periapical abscess without sinus: Secondary | ICD-10-CM | POA: Diagnosis present

## 2013-11-16 DIAGNOSIS — G4733 Obstructive sleep apnea (adult) (pediatric): Secondary | ICD-10-CM | POA: Diagnosis present

## 2013-11-16 DIAGNOSIS — R0602 Shortness of breath: Secondary | ICD-10-CM | POA: Diagnosis not present

## 2013-11-16 DIAGNOSIS — I5021 Acute systolic (congestive) heart failure: Secondary | ICD-10-CM | POA: Diagnosis present

## 2013-11-16 DIAGNOSIS — I5023 Acute on chronic systolic (congestive) heart failure: Secondary | ICD-10-CM | POA: Diagnosis not present

## 2013-11-16 DIAGNOSIS — F17200 Nicotine dependence, unspecified, uncomplicated: Secondary | ICD-10-CM | POA: Diagnosis present

## 2013-11-16 DIAGNOSIS — I509 Heart failure, unspecified: Secondary | ICD-10-CM | POA: Diagnosis present

## 2013-11-16 DIAGNOSIS — E785 Hyperlipidemia, unspecified: Secondary | ICD-10-CM | POA: Diagnosis present

## 2013-11-16 DIAGNOSIS — E119 Type 2 diabetes mellitus without complications: Secondary | ICD-10-CM | POA: Diagnosis present

## 2013-11-16 DIAGNOSIS — I129 Hypertensive chronic kidney disease with stage 1 through stage 4 chronic kidney disease, or unspecified chronic kidney disease: Secondary | ICD-10-CM | POA: Diagnosis present

## 2013-11-16 DIAGNOSIS — R0989 Other specified symptoms and signs involving the circulatory and respiratory systems: Secondary | ICD-10-CM

## 2013-11-16 DIAGNOSIS — N184 Chronic kidney disease, stage 4 (severe): Secondary | ICD-10-CM | POA: Diagnosis present

## 2013-11-16 DIAGNOSIS — E876 Hypokalemia: Secondary | ICD-10-CM | POA: Diagnosis present

## 2013-11-16 DIAGNOSIS — R0600 Dyspnea, unspecified: Secondary | ICD-10-CM | POA: Diagnosis present

## 2013-11-16 DIAGNOSIS — I10 Essential (primary) hypertension: Secondary | ICD-10-CM | POA: Diagnosis present

## 2013-11-16 DIAGNOSIS — R0609 Other forms of dyspnea: Secondary | ICD-10-CM

## 2013-11-16 DIAGNOSIS — M109 Gout, unspecified: Secondary | ICD-10-CM | POA: Diagnosis present

## 2013-11-16 DIAGNOSIS — Z79899 Other long term (current) drug therapy: Secondary | ICD-10-CM

## 2013-11-16 DIAGNOSIS — Z6838 Body mass index (BMI) 38.0-38.9, adult: Secondary | ICD-10-CM

## 2013-11-16 HISTORY — DX: Heart failure, unspecified: I50.9

## 2013-11-16 LAB — PRO B NATRIURETIC PEPTIDE: Pro B Natriuretic peptide (BNP): 2888 pg/mL — ABNORMAL HIGH (ref 0–125)

## 2013-11-16 LAB — I-STAT TROPONIN, ED: Troponin i, poc: 0.06 ng/mL (ref 0.00–0.08)

## 2013-11-16 LAB — BASIC METABOLIC PANEL
Anion gap: 13 (ref 5–15)
BUN: 28 mg/dL — ABNORMAL HIGH (ref 6–23)
CO2: 27 mEq/L (ref 19–32)
Calcium: 9.5 mg/dL (ref 8.4–10.5)
Chloride: 101 mEq/L (ref 96–112)
Creatinine, Ser: 2.97 mg/dL — ABNORMAL HIGH (ref 0.50–1.35)
GFR calc Af Amer: 26 mL/min — ABNORMAL LOW (ref >90)
GFR calc non Af Amer: 23 mL/min — ABNORMAL LOW (ref >90)
Glucose, Bld: 103 mg/dL — ABNORMAL HIGH (ref 70–99)
Potassium: 4 mEq/L (ref 3.7–5.3)
Sodium: 141 mEq/L (ref 137–147)

## 2013-11-16 LAB — CBC
HCT: 42.8 % (ref 39.0–52.0)
Hemoglobin: 13.6 g/dL (ref 13.0–17.0)
MCH: 26.4 pg (ref 26.0–34.0)
MCHC: 31.8 g/dL (ref 30.0–36.0)
MCV: 83.1 fL (ref 78.0–100.0)
Platelets: 160 10*3/uL (ref 150–400)
RBC: 5.15 MIL/uL (ref 4.22–5.81)
RDW: 14 % (ref 11.5–15.5)
WBC: 8.6 10*3/uL (ref 4.0–10.5)

## 2013-11-16 MED ORDER — SODIUM CHLORIDE 0.9 % IJ SOLN: 3.0000 mL | Freq: Two times a day (BID) | INTRAMUSCULAR | Status: DC

## 2013-11-16 MED ORDER — ASPIRIN EC 325 MG PO TBEC
325.0000 mg | DELAYED_RELEASE_TABLET | Freq: Once | ORAL | Status: AC
  Administered 2013-11-16: 325 mg via ORAL
  Filled 2013-11-16: qty 1

## 2013-11-16 MED ORDER — SODIUM CHLORIDE 0.9 % IJ SOLN
3.0000 mL | Freq: Two times a day (BID) | INTRAMUSCULAR | Status: DC
  Administered 2013-11-17 – 2013-11-19 (×6): 3 mL via INTRAVENOUS

## 2013-11-16 MED ORDER — AMOXICILLIN 500 MG PO CAPS
500.0000 mg | ORAL_CAPSULE | Freq: Three times a day (TID) | ORAL | Status: DC
  Administered 2013-11-17 – 2013-11-19 (×9): 500 mg via ORAL
  Filled 2013-11-16 (×10): qty 1

## 2013-11-16 MED ORDER — NITROGLYCERIN 0.4 MG SL SUBL
0.4000 mg | SUBLINGUAL_TABLET | Freq: Once | SUBLINGUAL | Status: AC
  Administered 2013-11-16: 0.4 mg via SUBLINGUAL
  Filled 2013-11-16: qty 1

## 2013-11-16 MED ORDER — SODIUM CHLORIDE 0.9 % IJ SOLN: 3.0000 mL | INTRAMUSCULAR | Status: DC | PRN

## 2013-11-16 MED ORDER — HYDROCHLOROTHIAZIDE 25 MG PO TABS
25.0000 mg | ORAL_TABLET | Freq: Every day | ORAL | Status: DC
  Administered 2013-11-17: 25 mg via ORAL
  Filled 2013-11-16: qty 1

## 2013-11-16 MED ORDER — HEPARIN SODIUM (PORCINE) 5000 UNIT/ML IJ SOLN
5000.0000 [IU] | Freq: Three times a day (TID) | INTRAMUSCULAR | Status: DC
  Administered 2013-11-17 – 2013-11-19 (×8): 5000 [IU] via SUBCUTANEOUS
  Filled 2013-11-16 (×10): qty 1

## 2013-11-16 MED ORDER — FUROSEMIDE 10 MG/ML IJ SOLN
40.0000 mg | Freq: Two times a day (BID) | INTRAMUSCULAR | Status: DC
  Administered 2013-11-17: 40 mg via INTRAVENOUS
  Filled 2013-11-16 (×3): qty 4

## 2013-11-16 MED ORDER — FUROSEMIDE 10 MG/ML IJ SOLN
20.0000 mg | Freq: Once | INTRAMUSCULAR | Status: AC
  Administered 2013-11-16: 20 mg via INTRAVENOUS
  Filled 2013-11-16: qty 2

## 2013-11-16 MED ORDER — SODIUM CHLORIDE 0.9 % IV SOLN: 250.0000 mL | INTRAVENOUS | Status: DC | PRN

## 2013-11-16 MED ORDER — ATENOLOL 25 MG PO TABS
25.0000 mg | ORAL_TABLET | Freq: Every day | ORAL | Status: DC
Start: 2013-11-17 — End: 2013-11-17
  Administered 2013-11-17: 25 mg via ORAL
  Filled 2013-11-16: qty 1

## 2013-11-16 NOTE — ED Notes (Signed)
Presents with SOB began 2-3 days ago associated with bilateral leg swelling and fatigue. Reports having to sleep sitting up, SOB is worse with mild exertion. Bilateral breath sounds diminished. Sats 96% RA. BP 192/97. Right lower extremity with pitting edema.

## 2013-11-16 NOTE — H&P (Signed)
Hospitalist Admission History and Physical  Patient name: Craig Flynn Medical record number: [id number] Date of birth: [birth year] Age: 53 y.o. Gender: male  Primary Care Provider: Pcp Not In System  Chief Complaint: dyspnea  History of Present Illness:This is a 53 y.o. year old male with significant past medical history of obesity, HTN, renal insufficiency presenting with progressive dyspnea. Patient course progressive increased worker breathing, orthopnea, PND over the past one to 2 weeks. Patient's ex-wife is at the bedside. She states the patient recently relocated here from [place] where he is living on the past 2 years. Has had relatively poor medical followup. Patient denies any chest pains. Has noticed worsening lower extremity swelling. Does admit to a high salt diet. Also reports taking high-dose NSAIDs for right knee pain. No fevers or chills. Has not been checking her blood pressures at home. Had a dental procedure today. Currently on amoxicillin. In the ER, MAXIMUM TEMPERATURE 100.3, heart rate in the 90s, blood pressure in the 160s to 190s over 70s to 90s. Satting 96% on room air. CBC and be been essentially within normal limits apart from a creatinine of 2.97 (baseline creatinine around 3.4). Chest x-ray concerning for CHF. ProBNP 2900.   Assessment and Plan: Craig Flynn is a 53 y.o. year old male presenting with dyspnea Active Problems:   Dyspnea   CHF (congestive heart failure)   1-dyspnea -Concerning for new onset congestive heart failure-likely exacerbated by poor medical compliance in the setting of hypertension as well as recent NSAID use and high salt intake -IV Lasix, trend urine output and daily weight -2-D echo -Cycle cardiac enzymes  2-hypertension -Mildly elevated on presentation -Continue home regimen -Diuresis-assess for blood pressure response to this  3-dental abscess -Status post tooth removal today -Borderline temp on presentation likely  secondary to this -Panculture -Cont Amoxicillin  4-renal insufficiency  -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 CHF (congestive heart failure) 11/16/2013  . Diabetes mellitus 12/12/2011  . Chest pain 12/11/2011  . Dyspnea 12/11/2011  . Acute on chronic renal failure 12/11/2011  . Hypokalemia 12/11/2011  . HTN (hypertension) 12/11/2011  . SOB (shortness of breath) 12/11/2011  . HYPERLIPIDEMIA 12/31/2006  . GOUT 12/31/2006  . HYPERTENSION 12/31/2006  . 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 BACKACHE NOS 12/29/2006   Past Medical History: Past Medical History  Diagnosis Date  . Hypertension   . Gout   . Other specified disorder of intestines     pt states he had blockage of intestines - given colostomy    Past Surgical History: Past Surgical History  Procedure Laterality Date  . Cholecystectomy    . Colostomy      Social History: History   Social History  . Marital Status: Legally Separated    Spouse Name: N/A    Number of Children: N/A  . Years of Education: N/A   Social History Main Topics  . Smoking status: Current Every Day Smoker -- 0.50 packs/day for 30 years    Types: Cigarettes  . Smokeless tobacco: Never Used  . Alcohol Use: No  . Drug Use: No  . Sexual Activity:    Other Topics Concern  . None  Social History Narrative  . None    Family History: History reviewed. No pertinent family history.  Allergies: No Known Allergies  Current Facility-Administered Medications  Medication Dose Route Frequency Provider Last Rate Last Dose  . 0.9 %  sodium chloride infusion  250 mL Intravenous PRN Shanda Howells, MD      .  amoxicillin (AMOXIL) capsule 500 mg  500 mg Oral TID Shanda Howells, MD      . aspirin EC tablet 325 mg  325 mg Oral Once Kelby Aline, MD      . Derrill Memo ON 11/17/2013] atenolol (TENORMIN) tablet 25 mg  25 mg Oral Daily Shanda Howells, MD      . furosemide (LASIX) injection 20 mg  20 mg Intravenous Once Kelby Aline, MD      . heparin injection 5,000 Units  5,000 Units Subcutaneous 3 times per day Shanda Howells, MD      . Derrill Memo ON 11/17/2013] hydrochlorothiazide (HYDRODIURIL) tablet 25 mg  25 mg Oral Daily Shanda Howells, MD      . sodium chloride 0.9 % injection 3 mL  3 mL Intravenous Q12H Shanda Howells, MD      . sodium chloride 0.9 % injection 3 mL  3 mL Intravenous Q12H Shanda Howells, MD      . sodium chloride 0.9 % injection 3 mL  3 mL Intravenous PRN Shanda Howells, MD       Current Outpatient Prescriptions  Medication Sig Dispense Refill  . amoxicillin (AMOXIL) 500 MG capsule Take 500 mg by mouth 3 (three) times daily. Started 11/13/13, for 10 days ending 11/23/13      . atenolol (TENORMIN) 25 MG tablet Take 25 mg by mouth daily.      . hydrochlorothiazide (HYDRODIURIL) 25 MG tablet Take 25 mg by mouth daily.      Marland Kitchen HYDROcodone-acetaminophen (NORCO/VICODIN) 5-325 MG per tablet Take 1 tablet by mouth every 8 (eight) hours as needed.  15 tablet  0  . ibuprofen (ADVIL,MOTRIN) 200 MG tablet Take 800 mg by mouth every 6 (six) hours as needed for moderate pain.      . pseudoephedrine-acetaminophen (TYLENOL SINUS) 30-500 MG TABS Take 1 tablet by mouth every 6 (six) hours as needed (for cold).       Review Of Systems: 12 point ROS negative except as noted above in HPI.  Physical Exam: Filed Vitals:   11/16/13 2330  BP: 175/87  Pulse: 96  Temp:   Resp: 23    General: alert, cooperative and moderately obese HEENT: PERRLA and extra ocular movement intact Heart: S1, S2 normal, no murmur, rub or gallop, regular rate and rhythm Lungs: unlabored breathing and decreased breath sounds Abdomen:  abdomen is soft without significant tenderness, masses, organomegaly or guarding Extremities: 2-3+ pitting edema bilaterally, 2+ peripheral pulses  Skin:no rashes, no ecchymoses Neurology: normal without focal findings  Labs and Imaging: Lab Results  Component Value Date/Time   NA 141 11/16/2013  8:05 PM   K 4.0 11/16/2013  8:05 PM   CL 101 11/16/2013  8:05 PM   CO2 27 11/16/2013  8:05 PM   BUN 28* 11/16/2013  8:05 PM   CREATININE 2.97* 11/16/2013  8:05 PM   GLUCOSE 103* 11/16/2013  8:05 PM   GLUCOSE 130* 03/03/2006  9:10 AM   Lab Results  Component Value Date   WBC 8.6 11/16/2013   HGB 13.6 11/16/2013   HCT 42.8 11/16/2013   MCV 83.1 11/16/2013   PLT 160 11/16/2013    Dg  Chest Port 1 View  11/16/2013   CLINICAL DATA:  Difficulty breathing  EXAM: PORTABLE CHEST - 1 VIEW  COMPARISON:  December 11, 2011  FINDINGS: There is cardiomegaly with mild interstitial edema. There is pulmonary venous hypertension. No airspace consolidation. No adenopathy. No bone lesions.  IMPRESSION: Evidence of a degree of congestive heart failure.   Electronically Signed   By: Lowella Grip M.D.   On: 11/16/2013 21:25           Shanda Howells MD  Pager: [phone]

## 2013-11-16 NOTE — ED Provider Notes (Signed)
CSN: [id number]     Arrival date & time 11/16/13  1958 History   First MD Initiated Contact with Patient 11/16/13 2116     Chief Complaint  Patient presents with  . Shortness of Breath     (Consider location/radiation/quality/duration/timing/severity/associated sxs/prior Treatment) Patient is a 53 y.o. male presenting with shortness of breath. The history is provided by the patient.  Shortness of Breath Severity:  Severe Onset quality:  Gradual Duration:  3 days Timing:  Constant Progression:  Worsening Chronicity:  New Context: activity   Context comment:  After I walk 5 steps and now when I talk Relieved by:  Rest Worsened by:  Nothing tried Ineffective treatments:  None tried Associated symptoms: cough   Associated symptoms: no abdominal pain, no chest pain, no claudication, no diaphoresis, no ear pain, no fever, no hemoptysis, no PND, no rash, no sore throat, no sputum production, no syncope, no swollen glands, no vomiting and no wheezing   Risk factors: obesity   Risk factors: no recent alcohol use and no family hx of DVT     Past Medical History  Diagnosis Date  . Hypertension   . Gout   . 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 Cholecystectomy    . Colostomy     History reviewed. No pertinent family history. History  Substance Use Topics  . Smoking status: Current Every Day Smoker -- 0.50 packs/day for 30 years    Types: Cigarettes  . Smokeless tobacco: Never Used  . Alcohol Use: No    Review of Systems  Constitutional: Negative for fever and diaphoresis.  HENT: Negative for ear pain and sore throat.   Respiratory: Positive for cough and shortness of breath. Negative for hemoptysis, sputum production and wheezing.   Cardiovascular: Positive for leg swelling. Negative for chest pain, claudication, syncope and PND.  Gastrointestinal: Negative  for vomiting and abdominal pain.  Skin: Negative for rash.  All other systems reviewed and are negative.     Allergies  Review of patient's allergies indicates no known allergies.  Home Medications   Prior to Admission medications   Medication Sig Start Date End Date Taking? Authorizing Provider  allopurinol (ZYLOPRIM) 100 MG tablet Take 1 tablet (100 mg total) by mouth daily. 12/13/11   Geradine Girt, DO  HYDROcodone-acetaminophen (NORCO/VICODIN) 5-325 MG per tablet Take 1 tablet by mouth every 8 (eight) hours as needed. 12/13/11   Geradine Girt, DO  levofloxacin (LEVAQUIN) 750 MG tablet Take 1 tablet (750 mg total) by mouth every other day. First dose on Monday 12/13/11   Geradine Girt, DO  metoprolol tartrate (LOPRESSOR) 25 MG tablet Take 1 tablet (25 mg total) by mouth 2 (two) times daily. 12/13/11   Jessica U Vann, DO   BP 192/97  Pulse 95  Temp(Src) 100.3 F (37.9 C) (Oral)  Resp 28  SpO2 96% Physical Exam  Nursing note and vitals reviewed. Constitutional: He is oriented to person, place, and time. He appears well-developed and well-nourished. No distress.  HENT:  Head: Normocephalic and atraumatic.  Eyes: Conjunctivae and EOM are normal. Right eye exhibits no discharge. Left eye exhibits no discharge.  Neck: Normal range of motion. Neck supple. No tracheal deviation present.  Cardiovascular: Normal rate, regular rhythm and normal heart sounds.  Exam reveals no friction rub.   No murmur heard. Pulmonary/Chest: No stridor. Tachypnea  noted. No respiratory distress. He has no wheezes. He has rales in the right lower field and the left lower field. He exhibits no tenderness.  Abdominal: Soft. He exhibits no distension. There is no tenderness. There is no rebound and no guarding.  Musculoskeletal:       Right lower leg: He exhibits edema (2 +).       Left lower leg: He exhibits edema (2+).  Neurological: He is alert and oriented to person, place, and time.  Skin: Skin is warm.   Psychiatric: He has a normal mood and affect.    ED Course  Procedures (including critical care time) Labs Review Labs Reviewed  [place], ED    Imaging Review No results found.   EKG Interpretation None      MDM   Final diagnoses:  None    Pt with HTN and reports poor fu presents with SOB, exertional dyspnea. He was hospitalized here 2 years ago for similar symptoms and had a BNP of 1300 and an echo with EF of 55%, but dc summary indicates he had a COPD exacerbation. Also noted to have CKD at that time. He did not follow up with nephrology and [place]. He had poor follow up and non compliance with he BP medicine. Just refilled his atenolol today. BLE edema. BP 180/90. Rales at lower lung fields bilaterally. O2 sats wnls on room air. Slight tachypnea. ECG not suggestive of ischemia. Trop neg. BNP 2800. Cr 2.8. CXR  - cardiomegaly and pulm vasc congestion, no PNA, no PTX.  Admission for an acute CHF exacerbation in a high risk patient is indicated and will likely need an echo and CE cycled as he is having exertional dyspnea. Gave NTG to lower BP, which responded appropriately with SBP of 160. Gave 40 mg IV lasix as well.   Care discussed with my attending, Dr. Jeneen Rinks. Imaging and labs reviewed.      Kelby Aline, MD 11/17/13 [phone]

## 2013-11-17 ENCOUNTER — Encounter (HOSPITAL_COMMUNITY): Payer: Self-pay | Admitting: *Deleted

## 2013-11-17 DIAGNOSIS — K047 Periapical abscess without sinus: Secondary | ICD-10-CM | POA: Diagnosis present

## 2013-11-17 DIAGNOSIS — I509 Heart failure, unspecified: Secondary | ICD-10-CM | POA: Diagnosis present

## 2013-11-17 DIAGNOSIS — E876 Hypokalemia: Secondary | ICD-10-CM | POA: Diagnosis present

## 2013-11-17 DIAGNOSIS — I5023 Acute on chronic systolic (congestive) heart failure: Secondary | ICD-10-CM | POA: Diagnosis present

## 2013-11-17 DIAGNOSIS — F17200 Nicotine dependence, unspecified, uncomplicated: Secondary | ICD-10-CM | POA: Diagnosis present

## 2013-11-17 DIAGNOSIS — E119 Type 2 diabetes mellitus without complications: Secondary | ICD-10-CM | POA: Diagnosis present

## 2013-11-17 DIAGNOSIS — G4733 Obstructive sleep apnea (adult) (pediatric): Secondary | ICD-10-CM | POA: Diagnosis present

## 2013-11-17 DIAGNOSIS — Z79899 Other long term (current) drug therapy: Secondary | ICD-10-CM | POA: Diagnosis not present

## 2013-11-17 DIAGNOSIS — I5021 Acute systolic (congestive) heart failure: Secondary | ICD-10-CM | POA: Diagnosis present

## 2013-11-17 DIAGNOSIS — N184 Chronic kidney disease, stage 4 (severe): Secondary | ICD-10-CM | POA: Diagnosis present

## 2013-11-17 DIAGNOSIS — R0602 Shortness of breath: Secondary | ICD-10-CM | POA: Diagnosis present

## 2013-11-17 DIAGNOSIS — E785 Hyperlipidemia, unspecified: Secondary | ICD-10-CM | POA: Diagnosis present

## 2013-11-17 DIAGNOSIS — Z6838 Body mass index (BMI) 38.0-38.9, adult: Secondary | ICD-10-CM | POA: Diagnosis not present

## 2013-11-17 DIAGNOSIS — M109 Gout, unspecified: Secondary | ICD-10-CM | POA: Diagnosis present

## 2013-11-17 DIAGNOSIS — I129 Hypertensive chronic kidney disease with stage 1 through stage 4 chronic kidney disease, or unspecified chronic kidney disease: Secondary | ICD-10-CM | POA: Diagnosis present

## 2013-11-17 DIAGNOSIS — I517 Cardiomegaly: Secondary | ICD-10-CM

## 2013-11-17 HISTORY — DX: Acute systolic (congestive) heart failure: I50.21

## 2013-11-17 LAB — URINE MICROSCOPIC-ADD ON

## 2013-11-17 LAB — URINALYSIS, ROUTINE W REFLEX MICROSCOPIC
Bilirubin Urine: NEGATIVE
Glucose, UA: NEGATIVE mg/dL
Hgb urine dipstick: NEGATIVE
Ketones, ur: NEGATIVE mg/dL
Leukocytes, UA: NEGATIVE
Nitrite: NEGATIVE
Protein, ur: 30 mg/dL — AB
Specific Gravity, Urine: 1.012 (ref 1.005–1.030)
Urobilinogen, UA: 0.2 mg/dL (ref 0.0–1.0)
pH: 5 (ref 5.0–8.0)

## 2013-11-17 LAB — CBC WITH DIFFERENTIAL/PLATELET
Basophils Absolute: 0 10*3/uL (ref 0.0–0.1)
Basophils Relative: 0 % (ref 0–1)
Eosinophils Absolute: 0.4 10*3/uL (ref 0.0–0.7)
Eosinophils Relative: 5 % (ref 0–5)
HCT: 39.7 % (ref 39.0–52.0)
Hemoglobin: 12.5 g/dL — ABNORMAL LOW (ref 13.0–17.0)
Lymphocytes Relative: 23 % (ref 12–46)
Lymphs Abs: 1.7 10*3/uL (ref 0.7–4.0)
MCH: 26.3 pg (ref 26.0–34.0)
MCHC: 31.5 g/dL (ref 30.0–36.0)
MCV: 83.4 fL (ref 78.0–100.0)
Monocytes Absolute: 0.6 10*3/uL (ref 0.1–1.0)
Monocytes Relative: 8 % (ref 3–12)
Neutro Abs: 4.9 10*3/uL (ref 1.7–7.7)
Neutrophils Relative %: 64 % (ref 43–77)
Platelets: 150 10*3/uL (ref 150–400)
RBC: 4.76 MIL/uL (ref 4.22–5.81)
RDW: 14.3 % (ref 11.5–15.5)
WBC: 7.6 10*3/uL (ref 4.0–10.5)

## 2013-11-17 LAB — HEMOGLOBIN A1C
Hgb A1c MFr Bld: 6 % — ABNORMAL HIGH (ref <5.7)
Mean Plasma Glucose: 126 mg/dL — ABNORMAL HIGH (ref <117)

## 2013-11-17 LAB — COMPREHENSIVE METABOLIC PANEL
ALT: 13 U/L (ref 0–53)
AST: 12 U/L (ref 0–37)
Albumin: 3.6 g/dL (ref 3.5–5.2)
Alkaline Phosphatase: 48 U/L (ref 39–117)
Anion gap: 17 — ABNORMAL HIGH (ref 5–15)
BUN: 28 mg/dL — ABNORMAL HIGH (ref 6–23)
CO2: 20 mEq/L (ref 19–32)
Calcium: 8.7 mg/dL (ref 8.4–10.5)
Chloride: 103 mEq/L (ref 96–112)
Creatinine, Ser: 3.04 mg/dL — ABNORMAL HIGH (ref 0.50–1.35)
GFR calc Af Amer: 26 mL/min — ABNORMAL LOW (ref >90)
GFR calc non Af Amer: 22 mL/min — ABNORMAL LOW (ref >90)
Glucose, Bld: 95 mg/dL (ref 70–99)
Potassium: 3.6 mEq/L — ABNORMAL LOW (ref 3.7–5.3)
Sodium: 140 mEq/L (ref 137–147)
Total Bilirubin: 0.6 mg/dL (ref 0.3–1.2)
Total Protein: 6.5 g/dL (ref 6.0–8.3)

## 2013-11-17 LAB — TSH: TSH: 1.53 u[IU]/mL (ref 0.350–4.500)

## 2013-11-17 LAB — TROPONIN I
Troponin I: <0.3 ng/mL (ref <0.30)
Troponin I: <0.3 ng/mL (ref <0.30)
Troponin I: <0.3 ng/mL (ref <0.30)

## 2013-11-17 MED ORDER — CARVEDILOL 12.5 MG PO TABS
12.5000 mg | ORAL_TABLET | Freq: Two times a day (BID) | ORAL | Status: DC
  Administered 2013-11-17 – 2013-11-19 (×5): 12.5 mg via ORAL
  Filled 2013-11-17 (×6): qty 1

## 2013-11-17 MED ORDER — GUAIFENESIN ER 600 MG PO TB12
600.0000 mg | ORAL_TABLET | Freq: Two times a day (BID) | ORAL | Status: DC | PRN
  Administered 2013-11-17 (×2): 600 mg via ORAL
  Filled 2013-11-17: qty 1

## 2013-11-17 MED ORDER — ISOSORBIDE MONONITRATE ER 30 MG PO TB24
30.0000 mg | ORAL_TABLET | Freq: Every day | ORAL | Status: DC
  Administered 2013-11-17 – 2013-11-19 (×3): 30 mg via ORAL
  Filled 2013-11-17 (×3): qty 1

## 2013-11-17 MED ORDER — ACETAMINOPHEN 325 MG PO TABS
650.0000 mg | ORAL_TABLET | Freq: Four times a day (QID) | ORAL | Status: DC | PRN
  Administered 2013-11-17 – 2013-11-19 (×3): 650 mg via ORAL
  Filled 2013-11-17 (×3): qty 2

## 2013-11-17 MED ORDER — HYDRALAZINE HCL 25 MG PO TABS
37.5000 mg | ORAL_TABLET | Freq: Three times a day (TID) | ORAL | Status: DC
  Administered 2013-11-17 – 2013-11-19 (×6): 37.5 mg via ORAL
  Filled 2013-11-17 (×10): qty 1.5

## 2013-11-17 MED ORDER — FUROSEMIDE 10 MG/ML IJ SOLN: 40.0000 mg | Freq: Every day | INTRAMUSCULAR | Status: DC

## 2013-11-17 MED ORDER — FUROSEMIDE 10 MG/ML IJ SOLN
80.0000 mg | Freq: Two times a day (BID) | INTRAMUSCULAR | Status: DC
  Administered 2013-11-17 – 2013-11-18 (×2): 80 mg via INTRAVENOUS
  Filled 2013-11-17 (×3): qty 8

## 2013-11-17 MED ORDER — HYDRALAZINE HCL 25 MG PO TABS
25.0000 mg | ORAL_TABLET | Freq: Three times a day (TID) | ORAL | Status: DC
  Filled 2013-11-17 (×2): qty 1

## 2013-11-17 MED ORDER — SALINE SPRAY 0.65 % NA SOLN
1.0000 | NASAL | Status: DC | PRN
  Administered 2013-11-17: 1 via NASAL
  Filled 2013-11-17: qty 44

## 2013-11-17 MED ORDER — HYDRALAZINE HCL 10 MG PO TABS
10.0000 mg | ORAL_TABLET | Freq: Three times a day (TID) | ORAL | Status: DC
Start: 2013-11-17 — End: 2013-11-17
  Administered 2013-11-17: 10 mg via ORAL
  Filled 2013-11-17 (×3): qty 1

## 2013-11-17 NOTE — Consult Note (Signed)
CARDIOLOGY CONSULT NOTE   Patient ID: Craig Flynn MRN: [id number], DOB/AGE: [birth year]   Admit date: 11/16/2013 Date of Consult: 11/17/2013   Primary Physician: Pcp Not In System Primary Cardiologist: New - Dr. Haroldine Laws  Pt. Profile  53 year old [place] American male with past medical history of morbid obesity, hypertension, chronic kidney disease, possible obstructive sleep apnea and prediabetes presented with dyspnea  Problem List  Past Medical History  Diagnosis Date  . Hypertension   . Gout   . 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 CHF (congestive heart failure)     Past Surgical History  Procedure Laterality Date  . Cholecystectomy    . Colostomy       Allergies  No Known Allergies  HPI   The patient is a 53 year old African American male with past medical history of morbid obesity, hypertension, chronic kidney disease, possible obstructive sleep apnea and prediabetes. According to the patient, he has never seen a cardiologist before and has no cardiac issue before. His last echocardiogram on 12/11/2011 showed EF 55%, no regional wall motion abnormality, grade 1 diastolic dysfunction, no significant valvular disease. Patient denies any significant alcohol intake or any illicit drug use, however he does admit he smokes half a pack of cigarettes per day for the past 40 years. He has a long-standing history of hypertension and has been taking atenolol 25 mg daily. He states he moved to [place] last year for six-month. While in [place], patient run out of his antihypertensive medication, when he called his PCPs office, he was told he needed followup before he can be prescribed any more blood pressure medication. He states he did not take his atenolol for roughly 6 months while in [place]. He also states he has been having chronic left lower extremity edema. He states he had a lower extremity venous Doppler in  [place] more than 6 month ago however it did not reveal any sign of DVT. He take NSAIDs for his right knee pain. Patient moved back to [place] 4 months ago. He denies any recent fever, chill, cough, significant orthopnea or paroxysmal nocturnal dyspnea. He states his right lower extremity continued to be swollen despite negative venous Doppler. He denies any history of blood clots.  He states, for the past 3-4 days, he has been noticing gradually increase of exertional dyspnea. He denies any chest pain and states he never had chest pain before. Patient underwent a dental procedure on 11/16/2013, he was prescribed amoxicillin afterward. Patient presented to [facility] ED on the night of 11/16/2013 with worsening exertional dyspnea. On arrival to [facility] ED, it was noted he had a blood pressure of 192/97. He was also mildly tachycardic in the 90s with O2 saturation 96% on room air. He was also noted he had a temperature of 100.3 on arrival even though he denies any fevers. Significant laboratory finding include creatinine of 2.97, proBNP of 2888, and negative troponin. A1c was 6.0. TSH normal. Chest x-ray was consistent with congestive heart failure. EKG was obtained which showed sinus tachycardia with heart rate in the high 90s, T wave inversion in lead 3 and aVF. Patient was admitted by internal medicine group. Serial troponin was obtained overnight which were negative. Echocardiogram was obtained on 8/28 which showed EF 35-40%, diffuse hypokinesis, mild LVH. Cardiology was consulted for acute systolic dysfunction.    Inpatient Medications  . amoxicillin  500 mg Oral TID  .  atenolol  25 mg Oral Daily  . [START ON 11/18/2013] furosemide  40 mg Intravenous Daily  . heparin  5,000 Units Subcutaneous 3 times per day  . hydrALAZINE  10 mg Oral 3 times per day  . hydrochlorothiazide  25 mg Oral Daily  . isosorbide mononitrate  30 mg Oral Daily  . sodium chloride  3 mL Intravenous Q12H  . sodium chloride   3 mL Intravenous Q12H    Family History Family History  Problem Relation Age of Onset  . Stroke Mother   . Pneumonia Father     died of Pneumonia x3  . CAD Neg Hx      Social History History   Social History  . Marital Status: Legally Separated    Spouse Name: N/A    Number of Children: N/A  . Years of Education: N/A   Occupational History  . Not on file.   Social History Main Topics  . Smoking status: Current Every Day Smoker -- 0.50 packs/day for 30 years    Types: Cigarettes  . Smokeless tobacco: Never Used  . Alcohol Use: No  . Drug Use: No  . Sexual Activity: Not on file   Other Topics Concern  . Not on file   Social History Narrative  . No narrative on file     Review of Systems  General:  No chills, fever, night sweats or weight changes.  Cardiovascular:  No chest pain, orthopnea, palpitations, paroxysmal nocturnal dyspnea. + dyspnea on exertion, chronic R LE edema Dermatological: No rash, lesions/masses Respiratory: No cough, dyspnea Urologic: No hematuria, dysuria Abdominal:   No nausea, vomiting, diarrhea, bright red blood per rectum, melena, or hematemesis Neurologic:  No visual changes, wkns, changes in mental status. All other systems reviewed and are otherwise negative except as noted above.  Physical Exam  Blood pressure 163/83, pulse 78, temperature 98.4 F (36.9 C), temperature source Oral, resp. rate 25, height 6\' 2"  (1.88 m), weight 311 lb 8 oz (141.295 kg), SpO2 99.00%.  General: Pleasant, NAD Psych: Normal affect. Neuro: Alert and oriented X 3. Moves all extremities spontaneously. HEENT: Normal  Neck: Supple without bruits or JVP 10 Lungs:  Resp regular and unlabored, markedly decreased breath sound, however no obvious rale, rhonchi or wheezing Heart: RRR no s3, s4, or murmurs. Abdomen: Soft, non-tender, BS + x 4. Large, pendulous and distended Extremities: No clubbing, cyanosis or edema. DP/PT/Radials 2+ and equal  bilaterally.  Labs   Recent Labs  11/16/13 2335 11/17/13 0706 11/17/13 1118  TROPONINI <0.30 <0.30 <0.30   Lab Results  Component Value Date   WBC 7.6 11/17/2013   HGB 12.5* 11/17/2013   HCT 39.7 11/17/2013   MCV 83.4 11/17/2013   PLT 150 11/17/2013     Recent Labs Lab 11/17/13 0706  NA 140  K 3.6*  CL 103  CO2 20  BUN 28*  CREATININE 3.04*  CALCIUM 8.7  PROT 6.5  BILITOT 0.6  ALKPHOS 48  ALT 13  AST 12  GLUCOSE 95   Lab Results  Component Value Date   CHOL 193 05/16/2008   HDL 30.7* 05/16/2008   LDLCALC 143* 05/16/2008   TRIG 96 05/16/2008   No results found for this basename: DDIMER    Radiology/Studies  Dg Chest Port 1 View  11/16/2013   CLINICAL DATA:  Difficulty breathing  EXAM: PORTABLE CHEST - 1 VIEW  COMPARISON:  December 11, 2011  FINDINGS: There is cardiomegaly with mild interstitial edema. There is pulmonary venous  hypertension. No airspace consolidation. No adenopathy. No bone lesions.  IMPRESSION: Evidence of a degree of congestive heart failure.   Electronically Signed   By: Lowella Grip M.D.   On: 11/16/2013 21:25    ECG  Sinus tach with heart rate in the high 90s, T wave inversion in leads 3 and aVF  ASSESSMENT AND PLAN  1. Acute systolic heart failure  - likely 2/2 prolonged uncontrolled BP  - continue IV lasix 40mg  BID, change to PO lasix tomorrow  - increase hydralazine dose from 10mg  to 25 mg TID. Continue Imdur  - can discontinue HCTZ while on lasix, and switch atenolol to Coreg tomorrow  - will need ischemic workup at some point either with stress test vs cath (L&R), will discuss with Dr. Haroldine Laws  2. Hypertension: uncontrolled  - will need better control  3. CKD, stage IV  - no ACEI or ARB  - avoid NSAIDs 4. Prediabetes 5. Morbid obesity 6. Possible OSA: need outpatient sleep study 7. S/p dental procedure: febrile on arrival, however WBC normal. Blood culture pending    Hilbert Corrigan, PA-C 11/17/2013, 3:33  PM  Patient seen and examined with Almyra Deforest, PA-C. We discussed all aspects of the encounter. I agree with the assessment and plan as stated above.   53 y/o with longstanding HTN and stage IV CKD (baseline Cr 3.0-3.5) admitted with acute systolic CHF. EF 35-40% with global HK - newly decreased since 11/2011. 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 Will continue to diurese with IV lasix. Titrate hydralazine and NTG aggressively for afterload reduction. Switch atenolol to carvedilol. Will need nuclear study for ischemia eval when improved. No ACE or cath due to renal failure. Will need sleep study and f/u with Nephrology.   Alya Smaltz,MD 4:07 PM

## 2013-11-17 NOTE — Care Management Note (Addendum)
  Page 1 of 1   11/17/2013     1:58:34 PM CARE MANAGEMENT NOTE 11/17/2013  Patient:  Craig Flynn, Craig Flynn   Account Number:  [id number]  Date Initiated:  11/17/2013  Documentation initiated by:  Norton Bivins  Subjective/Objective Assessment:   CHf     Action/Plan:   CM to follow for disposition needs   Anticipated DC Date:  11/20/2013   Anticipated DC Plan:  HOME/SELF CARE         Choice offered to / List presented to:             Status of service:  In process, will continue to follow Medicare Important Message given?   (If response is "NO", the following Medicare IM given date fields will be blank) Date Medicare IM given:   Medicare IM given by:   Date Additional Medicare IM given:   Additional Medicare IM given by:    Discharge Disposition:    Per UR Regulation:  Reviewed for med. necessity/level of care/duration of stay  If discussed at [place] of Stay Meetings, dates discussed:    Comments:

## 2013-11-17 NOTE — Progress Notes (Signed)
Nutrition Education Note  RD consulted for nutrition education regarding a Heart Healthy diet (low sodium, low cholesterol; needs to lose weight)  RD provided "Heart Healthy Nutrition Therapy" handout from the Academy of [employer]. Reviewed patient's dietary recall. Provided examples on ways to decrease sodium and fat intake in diet. Discouraged intake of processed foods and use of salt shaker. Encouraged fresh fruits and vegetables as well as whole grain sources of carbohydrates to maximize fiber intake. Recommend patient use low-fat dairy products and discussed high cholesterol foods for patient to avoid.  Reviewed patient's dietary recall and provided tips/ examples for healthier meals and snacks.   RD discussed why it is important for patient to adhere to diet recommendations emphasized foods to avoid, and importance of weighing self.  Teach back method used.  Expect fair compliance.  Body mass index is 39.98 kg/(m^2). Pt meets criteria for Obesity based on current BMI.  Current diet order is Heart healthy, patient is consuming approximately 75% of meals at this time. Labs and medications reviewed. No further nutrition interventions warranted at this time. RD contact information provided. If additional nutrition issues arise, please re-consult RD.  Pryor Ochoa RD, LDN Inpatient Clinical Dietitian Pager: [phone] After Hours Pager: [phone]

## 2013-11-17 NOTE — Progress Notes (Signed)
Echocardiogram 2D Echocardiogram has been performed.  Saint, Welp 11/17/2013, 12:50 PM

## 2013-11-17 NOTE — Plan of Care (Signed)
Problem: Consults Goal: Tobacco Cessation referral if indicated Pt. Stated he is a current smoker. Tobacco cessation information given to pt. Pt. Declined use of nicotine patch.

## 2013-11-17 NOTE — Progress Notes (Signed)
Pt ambulated in hall on room air, o2 stats 93-100%.  No complaints from pt.  Will continue to monitor.

## 2013-11-17 NOTE — Progress Notes (Addendum)
[facility] Progress Note   Craig Flynn [id number] DOB: 08-29-1960 DOA: 11/16/2013 PCP: Pcp Not In System  Brief narrative: Craig Flynn is a 53 y.o. male with uncontrolled HTN, morbid obesity, CKD 4 presenting with acute heart failure and a low grade fever of 100.3.    Subjective: Feeling less short of breath- pedal edema improving.   Assessment/Plan: Principal Problem:   Acute systolic CHF  - improving with Lasix - EF 35-40% with diffuse hypokinesis- add Hydralazine and Nitro- cont Atenolol- will call cardiology consult - suspect this is secondary to uncontrolled HTN, morbid obesity and possibly underlying sleep apnea - dietary consult for low sodium, low cholesterol diet  Active Problems: Dental abscess - s/p extraction of tooth - Amoxil  Possible sleep apnea - had sleep study about 8 yrs ago but has gained weight since then - recommend he have another sleep study as outpt    HTN (hypertension) - uncontrolled - add Hydralazine and Imdur- unable to use ACE due to renal function    CKD (chronic kidney disease) stage 4, GFR 15-29 ml/min - stable    Morbid obesity - advised to lose weight  Code Status: Full code Family Communication: none Disposition Plan: home DVT prophylaxis: Heparin  Consultants: cardiology  Procedures: none  Antibiotics: Anti-infectives   Start     Dose/Rate Route Frequency Ordered Stop   11/16/13 2345  amoxicillin (AMOXIL) capsule 500 mg     500 mg Oral 3 times daily 11/16/13 2337           Objective: Filed Weights   11/17/13 0048  Weight: 141.295 kg (311 lb 8 oz)    Intake/Output Summary (Last 24 hours) at 11/17/13 1436 Last data filed at 11/17/13 1326  Gross per 24 hour  Intake    840 ml  Output   2450 ml  Net  -1610 ml     Vitals Filed Vitals:   11/17/13 0608 11/17/13 1004 11/17/13 1352 11/17/13 1432  BP: 153/68 153/78 168/86 163/83  Pulse: 84 78 81 78  Temp: 99.1 F (37.3 C) 98.6 F (37 C) 98.4 F  (36.9 C)   TempSrc: Oral Oral Oral   Resp: 22 26 25    Height:      Weight:      SpO2: 99% 97% 99%     Exam: General: No acute respiratory distress Lungs: Clear to auscultation bilaterally without wheezes or crackles Cardiovascular: Regular rate and rhythm without murmur gallop or rub normal S1 and S2 Abdomen: Nontender, nondistended, soft, bowel sounds positive, no rebound, no ascites, no appreciable mass Extremities: No significant cyanosis, clubbing, or edema bilateral lower extremities  Data Reviewed: Basic Metabolic Panel:  Recent Labs Lab 11/16/13 2005 11/17/13 0706  NA 141 140  K 4.0 3.6*  CL 101 103  CO2 27 20  GLUCOSE 103* 95  BUN 28* 28*  CREATININE 2.97* 3.04*  CALCIUM 9.5 8.7   Liver Function Tests:  Recent Labs Lab 11/17/13 0706  AST 12  ALT 13  ALKPHOS 48  BILITOT 0.6  PROT 6.5  ALBUMIN 3.6   No results found for this basename: LIPASE, AMYLASE,  in the last 168 hours No results found for this basename: AMMONIA,  in the last 168 hours CBC:  Recent Labs Lab 11/16/13 2005 11/17/13 0706  WBC 8.6 7.6  NEUTROABS  --  4.9  HGB 13.6 12.5*  HCT 42.8 39.7  MCV 83.1 83.4  PLT 160 150   Cardiac Enzymes:  Recent Labs Lab 11/16/13  2335 11/17/13 0706 11/17/13 1118  TROPONINI <0.30 <0.30 <0.30   BNP (last 3 results)  Recent Labs  11/16/13 2005  PROBNP 2888.0*   CBG: No results found for this basename: GLUCAP,  in the last 168 hours  No results found for this or any previous visit (from the past 240 hour(s)).   Studies:  Recent x-ray studies have been reviewed in detail by the Attending Physician  Scheduled Meds:  Scheduled Meds: . amoxicillin  500 mg Oral TID  . atenolol  25 mg Oral Daily  . [START ON 11/18/2013] furosemide  40 mg Intravenous Daily  . heparin  5,000 Units Subcutaneous 3 times per day  . hydrALAZINE  10 mg Oral 3 times per day  . hydrochlorothiazide  25 mg Oral Daily  . isosorbide mononitrate  30 mg Oral Daily   . sodium chloride  3 mL Intravenous Q12H  . sodium chloride  3 mL Intravenous Q12H   Continuous Infusions:   Time spent on care of this patient: 35 min   [place], MD 11/17/2013, 2:36 PM  LOS: 1 day   [facility] Office  [phone] Pager - Text Page per [email or web address]  If 7PM-7AM, please contact night-coverage [email or web address]

## 2013-11-17 NOTE — Progress Notes (Signed)
UR completed Deejay Koppelman K. Ronisha Herringshaw, RN, BSN, MSHL, CCM  11/17/2013 1:59 PM

## 2013-11-17 NOTE — ED Provider Notes (Signed)
D/W Dr. Lucrezia Starch.  Pt examined.  Hypertensive.  Describes dyspnea with minimal exertion. Or congestion on x-ray. Elevated BNP. Normal troponin. EKG without acute changes. He has a history of previous admission treated with COPD exacerbation. No recent echocardiogram. Only on atenolol for blood pressure. I agree with admit for BP control, diuresis, echo.  Tanna Furry, MD 11/17/13 Shelah Lewandowsky

## 2013-11-18 LAB — CBC WITH DIFFERENTIAL/PLATELET
Basophils Absolute: 0 10*3/uL (ref 0.0–0.1)
Basophils Relative: 0 % (ref 0–1)
Eosinophils Absolute: 0.5 10*3/uL (ref 0.0–0.7)
Eosinophils Relative: 7 % — ABNORMAL HIGH (ref 0–5)
HCT: 38.2 % — ABNORMAL LOW (ref 39.0–52.0)
Hemoglobin: 12.2 g/dL — ABNORMAL LOW (ref 13.0–17.0)
Lymphocytes Relative: 29 % (ref 12–46)
Lymphs Abs: 2.1 10*3/uL (ref 0.7–4.0)
MCH: 25.7 pg — ABNORMAL LOW (ref 26.0–34.0)
MCHC: 31.9 g/dL (ref 30.0–36.0)
MCV: 80.6 fL (ref 78.0–100.0)
Monocytes Absolute: 0.6 10*3/uL (ref 0.1–1.0)
Monocytes Relative: 9 % (ref 3–12)
Neutro Abs: 4 10*3/uL (ref 1.7–7.7)
Neutrophils Relative %: 55 % (ref 43–77)
Platelets: 180 10*3/uL (ref 150–400)
RBC: 4.74 MIL/uL (ref 4.22–5.81)
RDW: 14.1 % (ref 11.5–15.5)
WBC: 7.1 10*3/uL (ref 4.0–10.5)

## 2013-11-18 LAB — COMPREHENSIVE METABOLIC PANEL
ALT: 13 U/L (ref 0–53)
AST: 16 U/L (ref 0–37)
Albumin: 3.8 g/dL (ref 3.5–5.2)
Alkaline Phosphatase: 48 U/L (ref 39–117)
Anion gap: 15 (ref 5–15)
BUN: 37 mg/dL — ABNORMAL HIGH (ref 6–23)
CO2: 25 mEq/L (ref 19–32)
Calcium: 8.8 mg/dL (ref 8.4–10.5)
Chloride: 99 mEq/L (ref 96–112)
Creatinine, Ser: 3.26 mg/dL — ABNORMAL HIGH (ref 0.50–1.35)
GFR calc Af Amer: 24 mL/min — ABNORMAL LOW (ref >90)
GFR calc non Af Amer: 20 mL/min — ABNORMAL LOW (ref >90)
Glucose, Bld: 104 mg/dL — ABNORMAL HIGH (ref 70–99)
Potassium: 3.2 mEq/L — ABNORMAL LOW (ref 3.7–5.3)
Sodium: 139 mEq/L (ref 137–147)
Total Bilirubin: 0.6 mg/dL (ref 0.3–1.2)
Total Protein: 6.8 g/dL (ref 6.0–8.3)

## 2013-11-18 LAB — URINE CULTURE
Colony Count: NO GROWTH
Culture: NO GROWTH

## 2013-11-18 MED ORDER — POTASSIUM CHLORIDE CRYS ER 20 MEQ PO TBCR
40.0000 meq | EXTENDED_RELEASE_TABLET | ORAL | Status: AC
  Administered 2013-11-18 (×2): 40 meq via ORAL
  Filled 2013-11-18 (×2): qty 2

## 2013-11-18 MED ORDER — FUROSEMIDE 40 MG PO TABS
60.0000 mg | ORAL_TABLET | Freq: Two times a day (BID) | ORAL | Status: DC
  Administered 2013-11-18 – 2013-11-19 (×2): 60 mg via ORAL
  Filled 2013-11-18 (×4): qty 1

## 2013-11-18 NOTE — Progress Notes (Signed)
Pharmacist Heart Failure Core Measure Documentation  Assessment: Craig Flynn has an EF documented as 35-40% on 11/17/13.   Rationale: Heart failure patients with left ventricular systolic dysfunction (LVSD) and an EF < 40% should be prescribed an angiotensin converting enzyme inhibitor (ACEI) or angiotensin receptor blocker (ARB) at discharge unless a contraindication is documented in the medical record.  This patient is not currently on an ACEI or ARB for HF.  This note is being placed in the record in order to provide documentation that a contraindication to the use of these agents is present for this encounter.  ACE Inhibitor or Angiotensin Receptor Blocker is contraindicated (specify all that apply)  []   ACEI allergy AND ARB allergy []   Angioedema []   Moderate or severe aortic stenosis []   Hyperkalemia []   Hypotension []   Renal artery stenosis [x]   Worsening renal function, preexisting renal disease or dysfunction      Hughes Better, PharmD, BCPS Clinical Pharmacist Pager: [phone] 11/18/2013 3:00 PM

## 2013-11-18 NOTE — Plan of Care (Signed)
Problem: Phase I Progression Outcomes Goal: EF % per last Echo/documented,Core Reminder form on chart Outcome: Not Progressing EF has changed from last evaluation.

## 2013-11-18 NOTE — Progress Notes (Signed)
Patient ID: Craig Flynn, male   DOB: 1960-09-25, 53 y.o.   MRN: [id number]      Subjective:    SOB improved   Objective:   Temp:  [98.3 F (36.8 C)-99.5 F (37.5 C)] 98.3 F (36.8 C) (08/29 0500) Pulse Rate:  [76-82] 76 (08/29 0500) Resp:  [19-25] 19 (08/29 0500) BP: (140-168)/(71-86) 140/75 mmHg (08/29 0500) SpO2:  [96 %-99 %] 97 % (08/29 0500) Weight:  [306 lb (138.801 kg)] 306 lb (138.801 kg) (08/29 0500) Last BM Date: 11/16/13  Filed Weights   11/17/13 0048 11/18/13 0500  Weight: 311 lb 8 oz (141.295 kg) 306 lb (138.801 kg)    Intake/Output Summary (Last 24 hours) at 11/18/13 1330 Last data filed at 11/18/13 1235  Gross per 24 hour  Intake    600 ml  Output   2475 ml  Net  -1875 ml    Exam:  General: NAD  Resp: CTAB  Cardiac: RRR, no m/r/g, no JVD  GI: abdomen soft, NT, ND  MSK: no LE edema  Neuro: no focal deficits    Lab Results:  Basic Metabolic Panel:  Recent Labs Lab 11/16/13 2005 11/17/13 0706 11/18/13 0335  NA 141 140 139  K 4.0 3.6* 3.2*  CL 101 103 99  CO2 27 20 25   GLUCOSE 103* 95 104*  BUN 28* 28* 37*  CREATININE 2.97* 3.04* 3.26*  CALCIUM 9.5 8.7 8.8    Liver Function Tests:  Recent Labs Lab 11/17/13 0706 11/18/13 0335  AST 12 16  ALT 13 13  ALKPHOS 48 48  BILITOT 0.6 0.6  PROT 6.5 6.8  ALBUMIN 3.6 3.8    CBC:  Recent Labs Lab 11/16/13 2005 11/17/13 0706 11/18/13 0335  WBC 8.6 7.6 7.1  HGB 13.6 12.5* 12.2*  HCT 42.8 39.7 38.2*  MCV 83.1 83.4 80.6  PLT 160 150 180    Cardiac Enzymes:  Recent Labs Lab 11/16/13 2335 11/17/13 0706 11/17/13 1118  TROPONINI <0.30 <0.30 <0.30    BNP:  Recent Labs  11/16/13 2005  PROBNP 2888.0*    Coagulation: No results found for this basename: INR,  in the last 168 hours  ECG:   Medications:   Scheduled Medications: . amoxicillin  500 mg Oral TID  . carvedilol  12.5 mg Oral BID WC  . furosemide  80 mg Intravenous BID  . heparin  5,000 Units  Subcutaneous 3 times per day  . hydrALAZINE  37.5 mg Oral 3 times per day  . isosorbide mononitrate  30 mg Oral Daily  . sodium chloride  3 mL Intravenous Q12H  . sodium chloride  3 mL Intravenous Q12H     Infusions:     PRN Medications:  sodium chloride, acetaminophen, guaiFENesin, sodium chloride, sodium chloride     Assessment/Plan   53 year old [place] American male with past medical history of morbid obesity, hypertension, chronic kidney disease, possible obstructive sleep apnea and prediabetes presented with dyspnea   1. Acute systolic HF - echo [id number] LVEF 35-40%, diffuse hypokinesis, normal diastolic function. Decrease from prior echo 11/2011 with LVEF 55%.  - admitted with SOB, volume overload, CXR with pulm edema - negative 1.6 liters yesterday, net negative 3.5 liters since admit. Cr mildly trending up. From lab review fairly labile Cr in the past ranging from 2 to 3.6 from labs in 2013, he has not data from late 2013 until now. Stop IV lasix, start oral 60mg  bid - started on coreg 12.5mg  bid, hydral/nitrates. No ACE/ARB/aldactone due to  renal function - stress testing can be done as outpatient at follow up, no indication for inpatient testing  - will write orders for pt to ambulate with nursing, if does well ok for discharge with outpt follow up. Will need outpt sleep study at some point, consider referral to nephrology.    Carlyle Dolly, M.D., F.A.C.C.

## 2013-11-19 ENCOUNTER — Other Ambulatory Visit: Payer: Self-pay | Admitting: Physician Assistant

## 2013-11-19 DIAGNOSIS — E876 Hypokalemia: Secondary | ICD-10-CM

## 2013-11-19 DIAGNOSIS — N184 Chronic kidney disease, stage 4 (severe): Secondary | ICD-10-CM

## 2013-11-19 LAB — CBC WITH DIFFERENTIAL/PLATELET
Basophils Absolute: 0 10*3/uL (ref 0.0–0.1)
Basophils Relative: 0 % (ref 0–1)
Eosinophils Absolute: 0.7 10*3/uL (ref 0.0–0.7)
Eosinophils Relative: 9 % — ABNORMAL HIGH (ref 0–5)
HCT: 38.2 % — ABNORMAL LOW (ref 39.0–52.0)
Hemoglobin: 12.6 g/dL — ABNORMAL LOW (ref 13.0–17.0)
Lymphocytes Relative: 32 % (ref 12–46)
Lymphs Abs: 2.4 10*3/uL (ref 0.7–4.0)
MCH: 26.5 pg (ref 26.0–34.0)
MCHC: 33 g/dL (ref 30.0–36.0)
MCV: 80.3 fL (ref 78.0–100.0)
Monocytes Absolute: 0.8 10*3/uL (ref 0.1–1.0)
Monocytes Relative: 11 % (ref 3–12)
Neutro Abs: 3.6 10*3/uL (ref 1.7–7.7)
Neutrophils Relative %: 48 % (ref 43–77)
Platelets: 237 10*3/uL (ref 150–400)
RBC: 4.76 MIL/uL (ref 4.22–5.81)
RDW: 14.8 % (ref 11.5–15.5)
WBC: 7.5 10*3/uL (ref 4.0–10.5)

## 2013-11-19 LAB — BASIC METABOLIC PANEL
Anion gap: 16 — ABNORMAL HIGH (ref 5–15)
BUN: 45 mg/dL — ABNORMAL HIGH (ref 6–23)
CO2: 25 mEq/L (ref 19–32)
Calcium: 9.3 mg/dL (ref 8.4–10.5)
Chloride: 98 mEq/L (ref 96–112)
Creatinine, Ser: 3.44 mg/dL — ABNORMAL HIGH (ref 0.50–1.35)
GFR calc Af Amer: 22 mL/min — ABNORMAL LOW (ref >90)
GFR calc non Af Amer: 19 mL/min — ABNORMAL LOW (ref >90)
Glucose, Bld: 111 mg/dL — ABNORMAL HIGH (ref 70–99)
Potassium: 3.5 mEq/L — ABNORMAL LOW (ref 3.7–5.3)
Sodium: 139 mEq/L (ref 137–147)

## 2013-11-19 LAB — MAGNESIUM: Magnesium: 1.7 mg/dL (ref 1.5–2.5)

## 2013-11-19 MED ORDER — ISOSORBIDE MONONITRATE ER 30 MG PO TB24: 30.0000 mg | ORAL_TABLET | Freq: Every day | ORAL | Status: DC

## 2013-11-19 MED ORDER — HYDRALAZINE HCL 25 MG PO TABS: 37.5000 mg | ORAL_TABLET | Freq: Three times a day (TID) | ORAL | Status: DC

## 2013-11-19 MED ORDER — CARVEDILOL 12.5 MG PO TABS: 12.5000 mg | ORAL_TABLET | Freq: Two times a day (BID) | ORAL | Status: DC

## 2013-11-19 MED ORDER — FUROSEMIDE 20 MG PO TABS: 40.0000 mg | ORAL_TABLET | Freq: Two times a day (BID) | ORAL | Status: DC

## 2013-11-19 MED ORDER — POTASSIUM CHLORIDE CRYS ER 20 MEQ PO TBCR
40.0000 meq | EXTENDED_RELEASE_TABLET | ORAL | Status: AC
  Administered 2013-11-19 (×2): 40 meq via ORAL
  Filled 2013-11-19 (×2): qty 2

## 2013-11-19 MED ORDER — MAGNESIUM SULFATE 40 MG/ML IJ SOLN
2.0000 g | Freq: Once | INTRAMUSCULAR | Status: AC
  Administered 2013-11-19: 2 g via INTRAVENOUS
  Filled 2013-11-19: qty 50

## 2013-11-19 MED ORDER — POTASSIUM CHLORIDE CRYS ER 20 MEQ PO TBCR: 40.0000 meq | EXTENDED_RELEASE_TABLET | Freq: Two times a day (BID) | ORAL | Status: DC

## 2013-11-19 MED ORDER — POTASSIUM CHLORIDE CRYS ER 20 MEQ PO TBCR: 20.0000 meq | EXTENDED_RELEASE_TABLET | Freq: Two times a day (BID) | ORAL | Status: DC

## 2013-11-19 NOTE — Plan of Care (Signed)
Problem: Phase II Progression Outcomes Goal: Dyspnea controlled with activity Outcome: Adequate for Discharge No shortness of breath noted during ambulation in hallway.

## 2013-11-19 NOTE — Discharge Instructions (Signed)

## 2013-11-19 NOTE — Discharge Summary (Signed)
Physician Discharge Summary  Craig Flynn [id number] DOB: 1960/12/27 DOA: 11/16/2013  PCP: Pcp Not In System  Admit date: 11/16/2013 Discharge date: 11/19/2013  Time spent: >45 minutes  Recommendations for Outpatient Follow-up:  1. F/u on Bmet   Discharge Condition: stable Diet recommendation: heart healthy, low sodium  Discharge Diagnoses:  Principal Problem:   Acute systolic CHF (congestive heart failure) Active Problems:   HTN (hypertension)   CKD (chronic kidney disease) stage 4, GFR 15-29 ml/min   Morbid obesity   History of present illness:  This is a 53 year old male with past medical history of hypertension, CKD 4 and morbid obesity presented to the hospital with a complaint of shortness of breath on exertion, orthopnea and swelling in his ankles which had been progressive over one to 2 weeks. In the ER he was noted to have pulmonary edema on exam and on chest x-ray and an elevated proBNP.  Hospital Course:  Acute on chronic systolic heart failure-See echo result below -Has been diuresed and is now in negative balance by about 6 L -Weight has dropped from 311 pounds to 302 pounds -He's been started on Coreg, Imdur and hydralazine-unable to tolerate ACE inhibitor do to severely elevated baseline renal function -Patient has been evaluated by cardiology and at this time no further workup is recommended.  Hypertension-uncontrolled -Previously on atenolol and HCTZ which have been switched to above-mentioned medications -BP is overall better controlled ranging from 0000000 systolic  Morbid obesity -Advised to lose weight with diet and exercise  Possible obstructive sleep apnea -He had a sleep study performed about 8 years ago but has since gained weight- -Have asked that he have another sleep study ordered by his PCP  Recent dental extraction -He's to continue amoxicillin until course is completed   Procedures:  ECHO  Study Conclusions  - Left ventricle: The  cavity size was mildly dilated. Wall thickness was increased in a pattern of mild LVH. Systolic function was moderately reduced. The estimated ejection fraction was in the range of 35% to 40%. Diffuse hypokinesis. Left ventricular diastolic function parameters were normal. - Right ventricle: The cavity size was mildly dilated. - Right atrium: The atrium was mildly dilated.    Consultations:  Cardiology  Discharge Exam: Filed Weights   11/17/13 0048 11/18/13 0500 11/19/13 0427  Weight: 141.295 kg (311 lb 8 oz) 138.801 kg (306 lb) 137.3 kg (302 lb 11.1 oz)   Filed Vitals:   11/19/13 0427  BP: 145/77  Pulse: 77  Temp: 98.3 F (36.8 C)  Resp: 18    General: AAO x 3, no distress Cardiovascular: RRR, no murmurs  Respiratory: clear to auscultation bilaterally GI: soft, non-tender, non-distended, bowel sound positive  Discharge Instructions You were cared for by a hospitalist during your hospital stay. If you have any questions about your discharge medications or the care you received while you were in the hospital after you are discharged, you can call the unit and asked to speak with the hospitalist on call if the hospitalist that took care of you is not available. Once you are discharged, your primary care physician will handle any further medical issues. Please note that NO REFILLS for any discharge medications will be authorized once you are discharged, as it is imperative that you return to your primary care physician (or establish a relationship with a primary care physician if you do not have one) for your aftercare needs so that they can reassess your need for medications and monitor your lab values.  Medication List    ASK your doctor about these medications       amoxicillin 500 MG capsule  Commonly known as:  AMOXIL  Take 500 mg by mouth 3 (three) times daily. Started 11/13/13, for 10 days ending 11/23/13     atenolol 25 MG tablet  Commonly known as:  TENORMIN   Take 25 mg by mouth daily.     hydrochlorothiazide 25 MG tablet  Commonly known as:  HYDRODIURIL  Take 25 mg by mouth daily.     HYDROcodone-acetaminophen 5-325 MG per tablet  Commonly known as:  NORCO/VICODIN  Take 1 tablet by mouth every 8 (eight) hours as needed.     ibuprofen 200 MG tablet  Commonly known as:  ADVIL,MOTRIN  Take 800 mg by mouth every 6 (six) hours as needed for moderate pain.     pseudoephedrine-acetaminophen 30-500 MG Tabs  Commonly known as:  TYLENOL SINUS  Take 1 tablet by mouth every 6 (six) hours as needed (for cold).       No Known Allergies    The results of significant diagnostics from this hospitalization (including imaging, microbiology, ancillary and laboratory) are listed below for reference.    Significant Diagnostic Studies: Dg Chest Port 1 View  11/16/2013   CLINICAL DATA:  Difficulty breathing  EXAM: PORTABLE CHEST - 1 VIEW  COMPARISON:  December 11, 2011  FINDINGS: There is cardiomegaly with mild interstitial edema. There is pulmonary venous hypertension. No airspace consolidation. No adenopathy. No bone lesions.  IMPRESSION: Evidence of a degree of congestive heart failure.   Electronically Signed   By: Lowella Grip M.D.   On: 11/16/2013 21:25    Microbiology: Recent Results (from the past 240 hour(s))  URINE CULTURE     Status: None   Collection Time    11/17/13  7:48 AM      Result Value Ref Range Status   Specimen Description URINE, RANDOM   Final   Special Requests NONE   Final   Culture  Setup Time     Final   Value: 11/17/2013 12:49     Performed at [facility]     Final   Value: NO GROWTH     Performed at [facility]   Culture     Final   Value: NO GROWTH     Performed at [facility]   Report Status 11/18/2013 FINAL   Final  CULTURE, BLOOD (ROUTINE X 2)     Status: None   Collection Time    11/17/13  8:22 AM      Result Value Ref Range Status   Specimen Description  BLOOD LEFT HAND   Final   Special Requests BOTTLES DRAWN AEROBIC AND ANAEROBIC 10CC   Final   Culture  Setup Time     Final   Value: 11/17/2013 12:45     Performed at [facility]   Culture     Final   Value:        BLOOD CULTURE RECEIVED NO GROWTH TO DATE CULTURE WILL BE HELD FOR 5 DAYS BEFORE ISSUING A FINAL NEGATIVE REPORT     Performed at [facility]   Report Status PENDING   Incomplete  CULTURE, BLOOD (ROUTINE X 2)     Status: None   Collection Time    11/17/13  8:40 AM      Result Value Ref Range Status   Specimen Description BLOOD LEFT HAND  Final   Special Requests BOTTLES DRAWN AEROBIC ONLY 4CC   Final   Culture  Setup Time     Final   Value: 11/17/2013 12:45     Performed at [facility]   Culture     Final   Value:        BLOOD CULTURE RECEIVED NO GROWTH TO DATE CULTURE WILL BE HELD FOR 5 DAYS BEFORE ISSUING A FINAL NEGATIVE REPORT     Performed at [facility]   Report Status PENDING   Incomplete     Labs: Basic Metabolic Panel:  Recent Labs Lab 11/16/13 2005 11/17/13 0706 11/18/13 0335 11/19/13 0800  NA 141 140 139 139  K 4.0 3.6* 3.2* 3.5*  CL 101 103 99 98  CO2 27 20 25 25   GLUCOSE 103* 95 104* 111*  BUN 28* 28* 37* 45*  CREATININE 2.97* 3.04* 3.26* 3.44*  CALCIUM 9.5 8.7 8.8 9.3  MG  --   --   --  1.7   Liver Function Tests:  Recent Labs Lab 11/17/13 0706 11/18/13 0335  AST 12 16  ALT 13 13  ALKPHOS 48 48  BILITOT 0.6 0.6  PROT 6.5 6.8  ALBUMIN 3.6 3.8   No results found for this basename: LIPASE, AMYLASE,  in the last 168 hours No results found for this basename: AMMONIA,  in the last 168 hours CBC:  Recent Labs Lab 11/16/13 2005 11/17/13 0706 11/18/13 0335 11/19/13 0319  WBC 8.6 7.6 7.1 7.5  NEUTROABS  --  4.9 4.0 3.6  HGB 13.6 12.5* 12.2* 12.6*  HCT 42.8 39.7 38.2* 38.2*  MCV 83.1 83.4 80.6 80.3  PLT 160 150 180 237   Cardiac Enzymes:  Recent Labs Lab 11/16/13 2335 11/17/13 0706  11/17/13 1118  TROPONINI <0.30 <0.30 <0.30   BNP: BNP (last 3 results)  Recent Labs  11/16/13 2005  PROBNP 2888.0*   CBG: No results found for this basename: GLUCAP,  in the last 168 hours     Signed:  Debbe Odea, MD [facility] 11/19/2013, 9:20 AM

## 2013-11-19 NOTE — Progress Notes (Signed)
Released from facility.  Verbalized understanding of discharge instructions.  Congestive Heart Failure education reinforced.

## 2013-11-19 NOTE — Progress Notes (Signed)
Patient: Craig Flynn / Admit Date: 11/16/2013 / Date of Encounter: 11/19/2013, 9:22 AM   Subjective: Feeling good. SOB resolved. LEE continues to improve. We discussed 2L fluid restriction, low sodium, daily weights, avoidance of meds in NSAID class.   Objective: Telemetry: NSR, brief run of atrial tach this AM Physical Exam: Blood pressure 145/77, pulse 77, temperature 98.3 F (36.8 C), temperature source Oral, resp. rate 18, height 6\' 2"  (1.88 m), weight 302 lb 11.1 oz (137.3 kg), SpO2 98.00%. General: Well developed, well nourished overweight 53AAM in no acute distress. Head: Normocephalic, atraumatic, sclera non-icteric, no xanthomas, nares are without discharge. Neck:  JVP not elevated. Lungs: Clear bilaterally to auscultation without wheezes, rales, or rhonchi. Breathing is unlabored. Heart: RRR S1 S2 without murmurs, rubs, or gallops.  Abdomen: Soft, non-tender, non-distended with normoactive bowel sounds. No rebound/guarding. Extremities: No clubbing or cyanosis. Tr BL edema. Distal pedal pulses are 2+ and equal bilaterally. Neuro: Alert and oriented X 3. Moves all extremities spontaneously. Psych:  Responds to questions appropriately with a normal affect.   Intake/Output Summary (Last 24 hours) at 11/19/13 [id number] Last data filed at 11/19/13 0600  Gross per 24 hour  Intake    580 ml  Output   3550 ml  Net  -2970 ml    Inpatient Medications:  . amoxicillin  500 mg Oral TID  . carvedilol  12.5 mg Oral BID WC  . furosemide  60 mg Oral BID  . heparin  5,000 Units Subcutaneous 3 times per day  . hydrALAZINE  37.5 mg Oral 3 times per day  . isosorbide mononitrate  30 mg Oral Daily  . magnesium sulfate 1 - 4 g bolus IVPB  2 g Intravenous Once  . potassium chloride  40 mEq Oral Q4H  . sodium chloride  3 mL Intravenous Q12H  . sodium chloride  3 mL Intravenous Q12H   Infusions:    Labs:  Recent Labs  11/18/13 0335 11/19/13 0800  NA 139 139  K 3.2* 3.5*  CL 99 98    CO2 25 25  GLUCOSE 104* 111*  BUN 37* 45*  CREATININE 3.26* 3.44*  CALCIUM 8.8 9.3  MG  --  1.7    Recent Labs  11/17/13 0706 11/18/13 0335  AST 12 16  ALT 13 13  ALKPHOS 48 48  BILITOT 0.6 0.6  PROT 6.5 6.8  ALBUMIN 3.6 3.8    Recent Labs  11/18/13 0335 11/19/13 0319  WBC 7.1 7.5  NEUTROABS 4.0 3.6  HGB 12.2* 12.6*  HCT 38.2* 38.2*  MCV 80.6 80.3  PLT 180 237    Recent Labs  11/16/13 2335 11/17/13 0706 11/17/13 1118  TROPONINI <0.30 <0.30 <0.30   No components found with this basename: POCBNP,   Recent Labs  11/17/13 0143  HGBA1C 6.0*     Radiology/Studies:  Dg Chest Port 1 View  11/16/2013   CLINICAL DATA:  Difficulty breathing  EXAM: PORTABLE CHEST - 1 VIEW  COMPARISON:  December 11, 2011  FINDINGS: There is cardiomegaly with mild interstitial edema. There is pulmonary venous hypertension. No airspace consolidation. No adenopathy. No bone lesions.  IMPRESSION: Evidence of a degree of congestive heart failure.   Electronically Signed   By: Lowella Grip M.D.   On: 11/16/2013 21:25     Assessment and Plan  53 year old [place] American male with past medical history of morbid obesity (Body mass index is 38.85 kg/(m^2).), hypertension, chronic kidney disease, possible obstructive sleep apnea, prediabetes and recent dental  procedure presented with dyspnea.  1. Acute systolic CHF - EF [id number]  - echo 11/17/13 LVEF 35-40%, diffuse hypokinesis, normal diastolic function. Decrease from prior echo 11/2011 with LVEF 55% - admitted with SOB, volume overload, CXR with pulm edema  - negative 2.8 liters yesterday, net negative 5.8 liters since admit. Cr mildly trending up - from lab review fairly labile Cr in the past ranging from 2 to 3.6 from labs in 2013, he has not data from late 2013 until now.  - continue Coreg 12.5mg  bid, hydral/nitrates. No ACE/ARB/aldactone due to renal function - per discussion with MD, rx Lasix 40mg  BID at home - stress testing can be  done as outpatient at follow up, no indication for inpatient testing - will need to reassess EF as outpatient to determine if candidate for ICD  2. CKD stage stage IV, was taking high dose NSAIDS prior to admission - recommend referral to nephrology to evaluate for sequelae of such including secondary metabolic bone disease, etc - should not use ANY MORE NSAIDS   3. Hypokalemia/hypomagnesemia - primary team repleting lytes  I have left a message on our office's scheduling voicemail requesting a follow-up appointment (transition of care) with [facility], and our office will call the patient with this appointment.  Signed, Melina Copa PA-C   Attending Note Patient seen and dicussed with PA Dunn. Agree with her documentation above. Can be evaluated for outpatient stress testing in setting of systolic dysfunction, no indication for inpatient testing at this time. Will need close follow up   Zandra Abts MD

## 2013-11-23 LAB — CULTURE, BLOOD (ROUTINE X 2)
Culture: NO GROWTH
Culture: NO GROWTH

## 2013-11-23 NOTE — ED Provider Notes (Signed)
I saw and evaluated the patient, reviewed the resident's note and I agree with the findings and plan.   EKG Interpretation   Date/Time:  Thursday November 16 2013 20:03:00 EDT Ventricular Rate:  97 PR Interval:  180 QRS Duration: 98 QT Interval:  346 QTC Calculation: 439 R Axis:   76 Text Interpretation:  Sinus rhythm with Premature atrial complexes ST \\T \  T wave abnormality, consider inferior ischemia Abnormal ECG ED PHYSICIAN  INTERPRETATION AVAILABLE IN [facility] Confirmed by TEST, Record  ([id number]) on 11/18/2013 8:54:46 AM        Tanna Furry, MD 11/23/13 2120

## 2014-07-07 ENCOUNTER — Emergency Department (HOSPITAL_COMMUNITY): Payer: BLUE CROSS/BLUE SHIELD

## 2014-07-07 ENCOUNTER — Encounter (HOSPITAL_COMMUNITY): Payer: Self-pay

## 2014-07-07 DIAGNOSIS — I509 Heart failure, unspecified: Secondary | ICD-10-CM | POA: Insufficient documentation

## 2014-07-07 DIAGNOSIS — I10 Essential (primary) hypertension: Secondary | ICD-10-CM | POA: Diagnosis not present

## 2014-07-07 DIAGNOSIS — Z79899 Other long term (current) drug therapy: Secondary | ICD-10-CM | POA: Insufficient documentation

## 2014-07-07 DIAGNOSIS — Z720 Tobacco use: Secondary | ICD-10-CM | POA: Insufficient documentation

## 2014-07-07 DIAGNOSIS — R079 Chest pain, unspecified: Secondary | ICD-10-CM | POA: Diagnosis present

## 2014-07-07 DIAGNOSIS — R609 Edema, unspecified: Secondary | ICD-10-CM | POA: Diagnosis not present

## 2014-07-07 DIAGNOSIS — M25512 Pain in left shoulder: Secondary | ICD-10-CM | POA: Diagnosis not present

## 2014-07-07 LAB — BASIC METABOLIC PANEL
Anion gap: 13 (ref 5–15)
BUN: 34 mg/dL — ABNORMAL HIGH (ref 6–23)
CO2: 26 mmol/L (ref 19–32)
Calcium: 8.8 mg/dL (ref 8.4–10.5)
Chloride: 98 mmol/L (ref 96–112)
Creatinine, Ser: 3.17 mg/dL — ABNORMAL HIGH (ref 0.50–1.35)
GFR calc Af Amer: 24 mL/min — ABNORMAL LOW (ref >90)
GFR calc non Af Amer: 21 mL/min — ABNORMAL LOW (ref >90)
Glucose, Bld: 213 mg/dL — ABNORMAL HIGH (ref 70–99)
Potassium: 3.1 mmol/L — ABNORMAL LOW (ref 3.5–5.1)
Sodium: 137 mmol/L (ref 135–145)

## 2014-07-07 LAB — CBC
HCT: 37.9 % — ABNORMAL LOW (ref 39.0–52.0)
Hemoglobin: 12.4 g/dL — ABNORMAL LOW (ref 13.0–17.0)
MCH: 26.5 pg (ref 26.0–34.0)
MCHC: 32.7 g/dL (ref 30.0–36.0)
MCV: 81 fL (ref 78.0–100.0)
Platelets: 226 10*3/uL (ref 150–400)
RBC: 4.68 MIL/uL (ref 4.22–5.81)
RDW: 14.7 % (ref 11.5–15.5)
WBC: 7.9 10*3/uL (ref 4.0–10.5)

## 2014-07-07 LAB — I-STAT TROPONIN, ED: Troponin i, poc: 0.01 ng/mL (ref 0.00–0.08)

## 2014-07-07 NOTE — ED Notes (Signed)
Onset 4 days left chest pain radiating down left arm to elbow.  Pt reports unable to lift left arm but can bend arm at elbow and move it.  Pt reports is always short of breath- no changes.  No other s/s noted.

## 2014-07-08 ENCOUNTER — Emergency Department (HOSPITAL_COMMUNITY): Payer: BLUE CROSS/BLUE SHIELD

## 2014-07-08 ENCOUNTER — Emergency Department (HOSPITAL_COMMUNITY)
Admission: EM | Admit: 2014-07-08 | Discharge: 2014-07-08 | Disposition: A | Discharge status: Home / Self Care | Payer: BLUE CROSS/BLUE SHIELD | Attending: Emergency Medicine | Admitting: Emergency Medicine

## 2014-07-08 DIAGNOSIS — R079 Chest pain, unspecified: Secondary | ICD-10-CM

## 2014-07-08 DIAGNOSIS — M25512 Pain in left shoulder: Secondary | ICD-10-CM

## 2014-07-08 LAB — BRAIN NATRIURETIC PEPTIDE: B Natriuretic Peptide: 23 pg/mL (ref 0.0–100.0)

## 2014-07-08 MED ORDER — OXYCODONE-ACETAMINOPHEN 5-325 MG PO TABS: 1.0000 | ORAL_TABLET | ORAL | Status: DC | PRN

## 2014-07-08 MED ORDER — OXYCODONE-ACETAMINOPHEN 5-325 MG PO TABS
2.0000 | ORAL_TABLET | Freq: Once | ORAL | Status: AC
  Administered 2014-07-08: 2 via ORAL
  Filled 2014-07-08: qty 2

## 2014-07-08 NOTE — ED Notes (Signed)
Pt back from X-ray.  

## 2014-07-08 NOTE — ED Provider Notes (Signed)
CSN: [id number]     Arrival date & time 07/07/14  2231 History  This chart was scribed for Craig Bo, MD by Eustaquio Maize, ED Scribe. This patient was seen in room A02C/A02C and the patient's care was started at 1:02 AM.    Chief Complaint  Patient presents with  . Chest Pain   The history is provided by the patient. No language interpreter was used.     HPI Comments: CHRISOPHER Flynn is a 54 y.o. male with hx HTN and CHF who presents to the Emergency Department complaining of constant left sided chest pain that began 4 days ago. Pt notes pain radiating down left arm to elbow as well as to the left neck. Pt is having difficulty lifting left arm but can bern arm at elbow and move it. He denies nausea, vomiting, diaphoresis, cough, fever, or any other symptoms. He denies any recent injury or trauma. Pt mentions being diagnosed with CHF in August of last year. He has been compliant with his medication since.   Past Medical History  Diagnosis Date  . Hypertension   . Gout   . 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 CHF (congestive heart failure)    Past Surgical History  Procedure Laterality Date  . Cholecystectomy     Family History  Problem Relation Age of Onset  . Stroke Mother   . Pneumonia Father     died of Pneumonia x3  . CAD Neg Hx    History  Substance Use Topics  . Smoking status: Current Every Day Smoker -- 0.50 packs/day for 30 years    Types: Cigarettes  . Smokeless tobacco: Never Used  . Alcohol Use: No    Review of Systems  Constitutional: Negative for fever and diaphoresis.  Respiratory: Negative for cough.   Cardiovascular: Positive for chest pain.  Gastrointestinal: Negative for nausea and vomiting.  Musculoskeletal: Positive for arthralgias (Left arm pain. ) and neck pain.  All other systems reviewed and are negative.     Allergies  Review of patient's allergies indicates no known  allergies.  Home Medications   Prior to Admission medications   Medication Sig Start Date End Date Taking? Authorizing Provider  carvedilol (COREG) 12.5 MG tablet Take 1 tablet (12.5 mg total) by mouth 2 (two) times daily with a meal. 11/19/13  Yes Debbe Odea, MD  furosemide (LASIX) 20 MG tablet Take 2 tablets (40 mg total) by mouth 2 (two) times daily. 11/19/13  Yes Debbe Odea, MD  hydrALAZINE (APRESOLINE) 25 MG tablet Take 1.5 tablets (37.5 mg total) by mouth every 8 (eight) hours. 11/19/13  Yes Debbe Odea, MD  isosorbide mononitrate (IMDUR) 30 MG 24 hr tablet Take 1 tablet (30 mg total) by mouth daily. 11/19/13  Yes Debbe Odea, MD  potassium chloride SA (K-DUR,KLOR-CON) 20 MEQ tablet Take 1 tablet (20 mEq total) by mouth 2 (two) times daily. 11/19/13  Yes Debbe Odea, MD  oxyCODONE-acetaminophen (PERCOCET) 5-325 MG per tablet Take 1 tablet by mouth every 4 (four) hours as needed for severe pain. 07/08/14   Craig Bo, MD   Triage Vitals: BP 158/62 mmHg  Pulse 89  Temp(Src) 97.9 F (36.6 C) (Oral)  Resp 20  Ht 6\' 2"  (1.88 m)  Wt 335 lb 14.4 oz (152.363 kg)  BMI 43.11 kg/m2  SpO2 98%   Physical Exam  Constitutional: He is oriented to person, place, and time. He  appears well-developed and well-nourished.  HENT:  Head: Normocephalic and atraumatic.  Right Ear: External ear normal.  Left Ear: External ear normal.  Eyes: Conjunctivae and EOM are normal. Pupils are equal, round, and reactive to light.  Neck: Normal range of motion and phonation normal. Neck supple.  Cardiovascular: Normal rate, regular rhythm and normal heart sounds.   Pulmonary/Chest: Effort normal and breath sounds normal. He exhibits no bony tenderness.  Abdominal: Soft. There is no tenderness.  Musculoskeletal: Normal range of motion. He exhibits edema (2+ edema in lower extremities bilaterally. ).  Neurological: He is alert and oriented to person, place, and time. No cranial nerve deficit or sensory deficit.  He exhibits normal muscle tone. Coordination normal.  Skin: Skin is warm, dry and intact.  Psychiatric: He has a normal mood and affect. His behavior is normal. Judgment and thought content normal.  Nursing note and vitals reviewed.   ED Course  Procedures (including critical care time)  DIAGNOSTIC STUDIES: Oxygen Saturation is 98% on RA, normal by my interpretation.    COORDINATION OF CARE: Medications  oxyCODONE-acetaminophen (PERCOCET/ROXICET) 5-325 MG per tablet 2 tablet (2 tablets Oral Given 07/08/14 0124)    Patient Vitals for the past 24 hrs:  BP Temp Temp src Pulse Resp SpO2 Height Weight  07/08/14 0401 - 97.7 F (36.5 C) Axillary - - - - -  07/08/14 0400 128/65 mmHg - - 73 18 98 % - -  07/08/14 0345 142/64 mmHg - - 75 15 97 % - -  07/08/14 0330 122/58 mmHg - - 65 19 96 % - -  07/08/14 0315 127/59 mmHg - - 69 16 98 % - -  07/08/14 0238 (!) 112/52 mmHg - - 73 15 94 % - -  07/08/14 0215 110/82 mmHg - - 73 17 96 % - -  07/08/14 0200 134/59 mmHg - - 72 20 95 % - -  07/08/14 0145 143/77 mmHg - - 71 20 98 % - -  07/08/14 0130 125/63 mmHg - - 79 17 97 % - -  07/08/14 0115 133/63 mmHg - - 79 21 97 % - -  07/08/14 0100 124/63 mmHg - - 78 14 97 % - -  07/07/14 2240 158/62 mmHg 97.9 F (36.6 C) Oral 89 20 98 % 6\' 2"  (1.88 m) (!) 335 lb 14.4 oz (152.363 kg)     1:07 AM-Discussed treatment plan which includes pain medication with pt at bedside and pt agreed to plan.   3:43 AM- Upon re evaluation, pt states that he is feeling better. He can lift his arm a little bit off of the bed. Will prescribe pt Percocet for the pain.   Labs Review Labs Reviewed  CBC - Abnormal; Notable for the following:    Hemoglobin 12.4 (*)    HCT 37.9 (*)    All other components within normal limits  BASIC METABOLIC PANEL - Abnormal; Notable for the following:    Potassium 3.1 (*)    Glucose, Bld 213 (*)    BUN 34 (*)    Creatinine, Ser 3.17 (*)    GFR calc non Af Amer 21 (*)    GFR calc Af Amer  24 (*)    All other components within normal limits  BRAIN NATRIURETIC PEPTIDE  I-STAT TROPOININ, ED    Imaging Review Dg Chest 1 View  07/08/2014   CLINICAL DATA:  Left-sided chest pain, shoulder arm pain.  EXAM: CHEST  1 VIEW  COMPARISON:  11/16/2013  FINDINGS: Heart  is at the upper limits of normal in size. Persistent but decreased vascular congestion from prior. Minimal left basilar subsegmental atelectasis or scarring. No consolidation, pleural effusion, or pneumothorax. No acute osseous abnormalities are seen.  IMPRESSION: Mild vascular congestion, however the degree of this has improved from prior exam. Decreased cardiomegaly   Electronically Signed   By: Jeb Levering M.D.   On: 07/08/2014 00:03   Dg Shoulder Left  07/08/2014   CLINICAL DATA:  Left lateral chest pain down to the elbow.  EXAM: LEFT SHOULDER - 2+ VIEW  COMPARISON:  None.  FINDINGS: No fracture dislocation. No focal bone lesion or erosion. There is mild degenerative spurring about the glenohumeral joint without joint narrowing.  IMPRESSION: 1. No acute osseous findings. 2. Mild glenohumeral osteoarthritis.   Electronically Signed   By: Monte Fantasia M.D.   On: 07/08/2014 03:04     EKG Interpretation   Date/Time:  Saturday July 07 2014 22:38:51 EDT Ventricular Rate:  90 PR Interval:  170 QRS Duration: 96 QT Interval:  392 QTC Calculation: 479 R Axis:   38 Text Interpretation:  Normal sinus rhythm Nonspecific T wave abnormality  Prolonged QT Abnormal ECG since last tracing no significant change  Confirmed by Eulis Foster  MD, Aerilynn Goin [id number]) on 07/08/2014 12:57:37 AM      MDM   Final diagnoses:  Left shoulder pain    Nonspecific left shoulder pain, doubt ACS, PE or pneumonia. Suspect. Rotator cuff inflammation, or tear.   Nursing Notes Reviewed/ Care Coordinated Applicable Imaging Reviewed Interpretation of Laboratory Data incorporated into ED treatment  The patient appears reasonably screened and/or  stabilized for discharge and I doubt any other medical condition or other [facility] requiring further screening, evaluation, or treatment in the ED at this time prior to discharge.  Plan: Home Medications- Percocet; Home Treatments- left arm sling; return here if the recommended treatment, does not improve the symptoms; Recommended follow up- orthopedic follow-up 1 week   I personally performed the services described in this documentation, which was scribed in my presence. The recorded information has been reviewed and is accurate.       Craig Bo, MD 07/08/14 [phone]

## 2014-07-08 NOTE — Discharge Instructions (Signed)
Wear the sling for comfort. Use heat on the sore area 3 or 4 times a day. Follow-up with your primary care doctor for checkup in one or 2 weeks.    Arthralgia Your caregiver has diagnosed you as suffering from an arthralgia. Arthralgia means there is pain in a joint. This can come from many reasons including:  Bruising the joint which causes soreness (inflammation) in the joint.  Wear and tear on the joints which occur as we grow older (osteoarthritis).  Overusing the joint.  Various forms of arthritis.  Infections of the joint. Regardless of the cause of pain in your joint, most of these different pains respond to anti-inflammatory drugs and rest. The exception to this is when a joint is infected, and these cases are treated with antibiotics, if it is a bacterial infection. HOME CARE INSTRUCTIONS   Rest the injured area for as long as directed by your caregiver. Then slowly start using the joint as directed by your caregiver and as the pain allows. Crutches as directed may be useful if the ankles, knees or hips are involved. If the knee was splinted or casted, continue use and care as directed. If an stretchy or elastic wrapping bandage has been applied today, it should be removed and re-applied every 3 to 4 hours. It should not be applied tightly, but firmly enough to keep swelling down. Watch toes and feet for swelling, bluish discoloration, coldness, numbness or excessive pain. If any of these problems (symptoms) occur, remove the ace bandage and re-apply more loosely. If these symptoms persist, contact your caregiver or return to this location.  For the first 24 hours, keep the injured extremity elevated on pillows while lying down.  Apply ice for 15-20 minutes to the sore joint every couple hours while awake for the first half day. Then 03-04 times per day for the first 48 hours. Put the ice in a plastic bag and place a towel between the bag of ice and your skin.  Wear any splinting,  casting, elastic bandage applications, or slings as instructed.  Only take over-the-counter or prescription medicines for pain, discomfort, or fever as directed by your caregiver. Do not use aspirin immediately after the injury unless instructed by your physician. Aspirin can cause increased bleeding and bruising of the tissues.  If you were given crutches, continue to use them as instructed and do not resume weight bearing on the sore joint until instructed. Persistent pain and inability to use the sore joint as directed for more than 2 to 3 days are warning signs indicating that you should see a caregiver for a follow-up visit as soon as possible. Initially, a hairline fracture (break in bone) may not be evident on X-rays. Persistent pain and swelling indicate that further evaluation, non-weight bearing or use of the joint (use of crutches or slings as instructed), or further X-rays are indicated. X-rays may sometimes not show a small fracture until a week or 10 days later. Make a follow-up appointment with your own caregiver or one to whom we have referred you. A radiologist (specialist in reading X-rays) may read your X-rays. Make sure you know how you are to obtain your X-ray results. Do not assume everything is normal if you do not hear from [place]. SEEK MEDICAL CARE IF: Bruising, swelling, or pain increases. SEEK IMMEDIATE MEDICAL CARE IF:   Your fingers or toes are numb or blue.  The pain is not responding to medications and continues to stay the same or get  worse.  The pain in your joint becomes severe.  You develop a fever over 102 F (38.9 C).  It becomes impossible to move or use the joint. MAKE SURE YOU:   Understand these instructions.  Will watch your condition.  Will get help right away if you are not doing well or get worse. Document Released: 03/09/2005 Document Revised: 06/01/2011 Document Reviewed: 10/26/2007 [facility] Patient Information 2015 [place], [place]. This  information is not intended to replace advice given to you by your health care provider. Make sure you discuss any questions you have with your health care provider.

## 2014-09-13 ENCOUNTER — Emergency Department (HOSPITAL_COMMUNITY)
Admission: EM | Admit: 2014-09-13 | Discharge: 2014-09-14 | Disposition: A | Discharge status: Home / Self Care | Payer: BLUE CROSS/BLUE SHIELD | Attending: Emergency Medicine | Admitting: Emergency Medicine

## 2014-09-13 ENCOUNTER — Emergency Department (HOSPITAL_COMMUNITY): Payer: BLUE CROSS/BLUE SHIELD

## 2014-09-13 ENCOUNTER — Encounter (HOSPITAL_COMMUNITY): Payer: Self-pay | Admitting: *Deleted

## 2014-09-13 DIAGNOSIS — Z791 Long term (current) use of non-steroidal anti-inflammatories (NSAID): Secondary | ICD-10-CM | POA: Diagnosis not present

## 2014-09-13 DIAGNOSIS — Z79899 Other long term (current) drug therapy: Secondary | ICD-10-CM | POA: Insufficient documentation

## 2014-09-13 DIAGNOSIS — M25561 Pain in right knee: Secondary | ICD-10-CM | POA: Insufficient documentation

## 2014-09-13 DIAGNOSIS — M25572 Pain in left ankle and joints of left foot: Secondary | ICD-10-CM | POA: Diagnosis not present

## 2014-09-13 DIAGNOSIS — Z8719 Personal history of other diseases of the digestive system: Secondary | ICD-10-CM | POA: Diagnosis not present

## 2014-09-13 DIAGNOSIS — I10 Essential (primary) hypertension: Secondary | ICD-10-CM | POA: Diagnosis not present

## 2014-09-13 DIAGNOSIS — R600 Localized edema: Secondary | ICD-10-CM | POA: Insufficient documentation

## 2014-09-13 DIAGNOSIS — R0602 Shortness of breath: Secondary | ICD-10-CM | POA: Insufficient documentation

## 2014-09-13 DIAGNOSIS — I509 Heart failure, unspecified: Secondary | ICD-10-CM | POA: Insufficient documentation

## 2014-09-13 DIAGNOSIS — M7989 Other specified soft tissue disorders: Secondary | ICD-10-CM | POA: Diagnosis present

## 2014-09-13 DIAGNOSIS — Z720 Tobacco use: Secondary | ICD-10-CM | POA: Diagnosis not present

## 2014-09-13 LAB — BASIC METABOLIC PANEL
Anion gap: 10 (ref 5–15)
BUN: 29 mg/dL — ABNORMAL HIGH (ref 6–20)
CO2: 27 mmol/L (ref 22–32)
Calcium: 8.8 mg/dL — ABNORMAL LOW (ref 8.9–10.3)
Chloride: 101 mmol/L (ref 101–111)
Creatinine, Ser: 3.06 mg/dL — ABNORMAL HIGH (ref 0.61–1.24)
GFR calc Af Amer: 25 mL/min — ABNORMAL LOW (ref >60)
GFR calc non Af Amer: 22 mL/min — ABNORMAL LOW (ref >60)
Glucose, Bld: 131 mg/dL — ABNORMAL HIGH (ref 65–99)
Potassium: 3.3 mmol/L — ABNORMAL LOW (ref 3.5–5.1)
Sodium: 138 mmol/L (ref 135–145)

## 2014-09-13 LAB — CBC
HCT: 38 % — ABNORMAL LOW (ref 39.0–52.0)
Hemoglobin: 11.6 g/dL — ABNORMAL LOW (ref 13.0–17.0)
MCH: 25.6 pg — ABNORMAL LOW (ref 26.0–34.0)
MCHC: 30.5 g/dL (ref 30.0–36.0)
MCV: 83.7 fL (ref 78.0–100.0)
Platelets: 218 10*3/uL (ref 150–400)
RBC: 4.54 MIL/uL (ref 4.22–5.81)
RDW: 15.4 % (ref 11.5–15.5)
WBC: 7.9 10*3/uL (ref 4.0–10.5)

## 2014-09-13 LAB — I-STAT TROPONIN, ED: Troponin i, poc: 0.01 ng/mL (ref 0.00–0.08)

## 2014-09-13 LAB — BRAIN NATRIURETIC PEPTIDE: B Natriuretic Peptide: 41.4 pg/mL (ref 0.0–100.0)

## 2014-09-13 MED ORDER — POTASSIUM CHLORIDE CRYS ER 20 MEQ PO TBCR
40.0000 meq | EXTENDED_RELEASE_TABLET | Freq: Once | ORAL | Status: AC
  Administered 2014-09-13: 40 meq via ORAL
  Filled 2014-09-13: qty 2

## 2014-09-13 MED ORDER — HYDROCODONE-ACETAMINOPHEN 5-325 MG PO TABS
1.0000 | ORAL_TABLET | Freq: Once | ORAL | Status: AC
  Administered 2014-09-13: 1 via ORAL
  Filled 2014-09-13: qty 1

## 2014-09-13 MED ORDER — FUROSEMIDE 10 MG/ML IJ SOLN
40.0000 mg | Freq: Once | INTRAMUSCULAR | Status: AC
  Administered 2014-09-13: 40 mg via INTRAVENOUS
  Filled 2014-09-13: qty 4

## 2014-09-13 NOTE — ED Notes (Signed)
Pt c/o increase in leg swelling over the last several days; pt states that he feels like he carrying a lot of fluid; pt has not been doing his daily weights; pt states that he is always short of breath but has had increase shortness of breath upon exertion

## 2014-09-13 NOTE — ED Notes (Signed)
EKG given to EDP, Zarcone, MD. For review. 

## 2014-09-13 NOTE — ED Provider Notes (Signed)
CSN: [id number]     Arrival date & time 09/13/14  2112 History   First MD Initiated Contact with Patient 09/13/14 2255     Chief Complaint  Patient presents with  . Leg Swelling  . Shortness of Breath     (Consider location/radiation/quality/duration/timing/severity/associated sxs/prior Treatment) HPI  This is a 54 year old male with history of hypertension, gout who presents with bilateral leg swelling and pain. Patient reports several day history of increasing lower leg swelling right greater than left. He states that when his legs swell it is common for his right leg swelled more than his left. He has also noted left ankle pain and right knee pain. He feels this is consistent with his gout. Patient reports that he has been out of Lasix for 2 days. He at baseline has shortness of breath but states that this is no worse than his normal. He denies any chest pain. He has been taking Motrin at home for his "gout symptoms."  Currently rates pain at 7 out of 10.  Past Medical History  Diagnosis Date  . Hypertension   . Gout   . 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 CHF (congestive heart failure)    Past Surgical History  Procedure Laterality Date  . Cholecystectomy     Family History  Problem Relation Age of Onset  . Stroke Mother   . Pneumonia Father     died of Pneumonia x3  . CAD Neg Hx    History  Substance Use Topics  . Smoking status: Current Every Day Smoker -- 0.50 packs/day for 30 years    Types: Cigarettes  . Smokeless tobacco: Never Used  . Alcohol Use: No    Review of Systems  Constitutional: Negative.  Negative for fever.  Respiratory: Positive for shortness of breath. Negative for cough and chest tightness.   Cardiovascular: Positive for leg swelling. Negative for chest pain.  Gastrointestinal: Negative.  Negative for abdominal pain.  Genitourinary: Negative.  Negative for dysuria.   Musculoskeletal: Negative for back pain.       Left ankle pain  Skin: Negative for color change.  Neurological: Negative for headaches.  All other systems reviewed and are negative.     Allergies  Review of patient's allergies indicates no known allergies.  Home Medications   Prior to Admission medications   Medication Sig Start Date End Date Taking? Authorizing Provider  carvedilol (COREG) 12.5 MG tablet Take 1 tablet (12.5 mg total) by mouth 2 (two) times daily with a meal. 11/19/13  Yes Debbe Odea, MD  hydrALAZINE (APRESOLINE) 25 MG tablet Take 1.5 tablets (37.5 mg total) by mouth every 8 (eight) hours. 11/19/13  Yes Debbe Odea, MD  ibuprofen (ADVIL,MOTRIN) 200 MG tablet Take 400-800 mg by mouth every 6 (six) hours as needed for moderate pain (pain).   Yes Historical Provider, MD  isosorbide mononitrate (IMDUR) 30 MG 24 hr tablet Take 1 tablet (30 mg total) by mouth daily. 11/19/13  Yes Debbe Odea, MD  naproxen (NAPROSYN) 500 MG tablet Take 500 mg by mouth 2 (two) times daily with a meal. 07/16/14  Yes Historical Provider, MD  oxyCODONE-acetaminophen (PERCOCET) 5-325 MG per tablet Take 1 tablet by mouth every 4 (four) hours as needed for severe pain. 07/08/14  Yes Daleen Bo, MD  potassium chloride SA (K-DUR,KLOR-CON) 20 MEQ tablet Take 1 tablet (20 mEq total) by mouth 2 (two) times daily. 11/19/13  Yes Debbe Odea, MD  tiZANidine (ZANAFLEX) 4 MG tablet take 1 tablet by mouth twice a day as needed SPASM/PAIN 07/16/14  Yes Historical Provider, MD  furosemide (LASIX) 20 MG tablet Take 2 tablets (40 mg total) by mouth 2 (two) times daily. 09/14/14   Merryl Hacker, MD  HYDROcodone-acetaminophen (NORCO/VICODIN) 5-325 MG per tablet Take 1-2 tablets by mouth every 6 (six) hours as needed for moderate pain. 09/14/14   Merryl Hacker, MD  methylPREDNISolone (MEDROL DOSEPAK) 4 MG TBPK tablet Take as directed on packet. 09/14/14   Merryl Hacker, MD   BP 156/79 mmHg  Pulse 63   Temp(Src) 98 F (36.7 C) (Oral)  Resp 11  Ht 6\' 2"  (1.88 m)  Wt 346 lb (156.945 kg)  BMI 44.40 kg/m2  SpO2 98% Physical Exam  Constitutional: He is oriented to person, place, and time. He appears well-developed and well-nourished. No distress.  HENT:  Head: Normocephalic and atraumatic.  Neck: Neck supple.  Cardiovascular: Normal rate, regular rhythm and normal heart sounds.   No murmur heard. Pulmonary/Chest: Effort normal and breath sounds normal. No respiratory distress. He has no wheezes. He has no rales.  Abdominal: Soft. Bowel sounds are normal. There is no tenderness. There is no rebound.  Musculoskeletal: He exhibits edema.  2+ bilateral lower extremity edema slightly greater on the right, normal range of motion of the bilateral ankles, no overlying skin changes, no erythema or warmth  Neurological: He is alert and oriented to person, place, and time.  Skin: Skin is warm and dry.  Psychiatric: He has a normal mood and affect.  Nursing note and vitals reviewed.   ED Course  Procedures (including critical care time) Labs Review Labs Reviewed  CBC - Abnormal; Notable for the following:    Hemoglobin 11.6 (*)    HCT 38.0 (*)    MCH 25.6 (*)    All other components within normal limits  BASIC METABOLIC PANEL - Abnormal; Notable for the following:    Potassium 3.3 (*)    Glucose, Bld 131 (*)    BUN 29 (*)    Creatinine, Ser 3.06 (*)    Calcium 8.8 (*)    GFR calc non Af Amer 22 (*)    GFR calc Af Amer 25 (*)    All other components within normal limits  BRAIN NATRIURETIC PEPTIDE  I-STAT TROPOININ, ED    Imaging Review Dg Chest 2 View  09/13/2014   CLINICAL DATA:  Bilateral leg swelling for 1 week  EXAM: CHEST  2 VIEW  COMPARISON:  Radiograph 07/07/2014  FINDINGS: Normal cardiac silhouette. There is a fine interstitial pattern similar prior. No pleural fluid. No pneumothorax. No focal infiltrate. Degenerative osteophytosis of the thoracic spine.  IMPRESSION: Fine  interstitial pattern suggests mild interstitial edema. No change from prior.   Electronically Signed   By: Suzy Bouchard M.D.   On: 09/13/2014 21:52     EKG Interpretation   Date/Time:  Thursday September 13 2014 23:11:47 EDT Ventricular Rate:  67 PR Interval:  202 QRS Duration: 103 QT Interval:  452 QTC Calculation: 477 R Axis:   41 Text Interpretation:  Sinus rhythm Borderline prolonged PR interval  Borderline prolonged QT interval Confirmed by Shauni Henner  MD, Peyton Rossner ([id number])  on 09/14/2014 12:28:49 AM      MDM   Final diagnoses:  Bilateral edema of lower extremity  Right knee pain  Left ankle pain    Patient presents with bilateral lower extremity edema. Worsening of  the last several days. He has been out of his Lasix for 2 days. Reports baseline shortness of breath. Also reports right knee and left ankle pain which he attributes to "gout."  No signs or symptoms suggestive of septic joints. Pulmonary and chest exam are reassuring.  Lab work is at the patient's baseline. No history of chronic kidney disease. Chest x-ray with fine interstitial pattern suggestive of mild interstitial edema. BNP is not elevated. Patient given 40 mg of IV Lasix.  Suspect his swelling is likely secondary to not taking Lasix at home. Potassium is 3.3. He takes potassium at home. Instructed patient to continue taking potassium and he will be given a prescription for his Lasix. Regarding his pain, patient is not a good candidate for NSAIDs given his renal function. Will place on a Medrol Dosepak and given Norco.  After history, exam, and medical workup I feel the patient has been appropriately medically screened and is safe for discharge home. Pertinent diagnoses were discussed with the patient. Patient was given return precautions.     Merryl Hacker, MD 09/14/14 0030

## 2014-09-13 NOTE — ED Notes (Signed)
EKG given to EDP Horton,MD. For review.

## 2014-09-14 MED ORDER — FUROSEMIDE 20 MG PO TABS: 40.0000 mg | ORAL_TABLET | Freq: Two times a day (BID) | ORAL | Status: DC

## 2014-09-14 MED ORDER — METHYLPREDNISOLONE 4 MG PO TBPK: ORAL_TABLET | ORAL | Status: DC

## 2014-09-14 MED ORDER — HYDROCODONE-ACETAMINOPHEN 5-325 MG PO TABS: 1.0000 | ORAL_TABLET | Freq: Four times a day (QID) | ORAL | Status: DC | PRN

## 2014-09-14 NOTE — Discharge Instructions (Signed)
Peripheral Edema You have swelling in your legs (peripheral edema). This swelling is due to excess accumulation of salt and water in your body. Edema may be a sign of heart, kidney or liver disease, or a side effect of a medication. It may also be due to problems in the leg veins. Elevating your legs and using special support stockings may be very helpful, if the cause of the swelling is due to poor venous circulation. Avoid long periods of standing, whatever the cause. Treatment of edema depends on identifying the cause. Chips, pretzels, pickles and other salty foods should be avoided. Restricting salt in your diet is almost always needed. Water pills (diuretics) are often used to remove the excess salt and water from your body via urine. These medicines prevent the kidney from reabsorbing sodium. This increases urine flow. Diuretic treatment may also result in lowering of potassium levels in your body. Potassium supplements may be needed if you have to use diuretics daily. Daily weights can help you keep track of your progress in clearing your edema. You should call your caregiver for follow up care as recommended. SEEK IMMEDIATE MEDICAL CARE IF:   You have increased swelling, pain, redness, or heat in your legs.  You develop shortness of breath, especially when lying down.  You develop chest or abdominal pain, weakness, or fainting.  You have a fever. Document Released: 04/16/2004 Document Revised: 06/01/2011 Document Reviewed: 03/27/2009  Gout Gout is an inflammatory arthritis caused by a buildup of uric acid crystals in the joints. Uric acid is a chemical that is normally present in the blood. When the level of uric acid in the blood is too high it can form crystals that deposit in your joints and tissues. This causes joint redness, soreness, and swelling (inflammation). Repeat attacks are common. Over time, uric acid crystals can form into masses (tophi) near a joint, destroying bone and  causing disfigurement. Gout is treatable and often preventable. CAUSES  The disease begins with elevated levels of uric acid in the blood. Uric acid is produced by your body when it breaks down a naturally found substance called purines. Certain foods you eat, such as meats and fish, contain high amounts of purines. Causes of an elevated uric acid level include:  Being passed down from parent to child (heredity).  Diseases that cause increased uric acid production (such as obesity, psoriasis, and certain cancers).  Excessive alcohol use.  Diet, especially diets rich in meat and seafood.  Medicines, including certain cancer-fighting medicines (chemotherapy), water pills (diuretics), and aspirin.  Chronic kidney disease. The kidneys are no longer able to remove uric acid well.  Problems with metabolism. Conditions strongly associated with gout include:  Obesity.  High blood pressure.  High cholesterol.  Diabetes. Not everyone with elevated uric acid levels gets gout. It is not understood why some people get gout and others do not. Surgery, joint injury, and eating too much of certain foods are some of the factors that can lead to gout attacks. SYMPTOMS   An attack of gout comes on quickly. It causes intense pain with redness, swelling, and warmth in a joint.  Fever can occur.  Often, only one joint is involved. Certain joints are more commonly involved:  Base of the big toe.  Knee.  Ankle.  Wrist.  Finger. Without treatment, an attack usually goes away in a few days to weeks. Between attacks, you usually will not have symptoms, which is different from many other forms of arthritis. DIAGNOSIS  Your caregiver will suspect gout based on your symptoms and exam. In some cases, tests may be recommended. The tests may include:  Blood tests.  Urine tests.  X-rays.  Joint fluid exam. This exam requires a needle to remove fluid from the joint (arthrocentesis). Using a  microscope, gout is confirmed when uric acid crystals are seen in the joint fluid. TREATMENT  There are two phases to gout treatment: treating the sudden onset (acute) attack and preventing attacks (prophylaxis).  Treatment of an Acute Attack.  Medicines are used. These include anti-inflammatory medicines or steroid medicines.  An injection of steroid medicine into the affected joint is sometimes necessary.  The painful joint is rested. Movement can worsen the arthritis.  You may use warm or cold treatments on painful joints, depending which works best for you.  Treatment to Prevent Attacks.  If you suffer from frequent gout attacks, your caregiver may advise preventive medicine. These medicines are started after the acute attack subsides. These medicines either help your kidneys eliminate uric acid from your body or decrease your uric acid production. You may need to stay on these medicines for a very long time.  The early phase of treatment with preventive medicine can be associated with an increase in acute gout attacks. For this reason, during the first few months of treatment, your caregiver may also advise you to take medicines usually used for acute gout treatment. Be sure you understand your caregiver's directions. Your caregiver may make several adjustments to your medicine dose before these medicines are effective.  Discuss dietary treatment with your caregiver or dietitian. Alcohol and drinks high in sugar and fructose and foods such as meat, poultry, and seafood can increase uric acid levels. Your caregiver or dietitian can advise you on drinks and foods that should be limited. HOME CARE INSTRUCTIONS   Do not take aspirin to relieve pain. This raises uric acid levels.  Only take over-the-counter or prescription medicines for pain, discomfort, or fever as directed by your caregiver.  Rest the joint as much as possible. When in bed, keep sheets and blankets off painful  areas.  Keep the affected joint raised (elevated).  Apply warm or cold treatments to painful joints. Use of warm or cold treatments depends on which works best for you.  Use crutches if the painful joint is in your leg.  Drink enough fluids to keep your urine clear or pale yellow. This helps your body get rid of uric acid. Limit alcohol, sugary drinks, and fructose drinks.  Follow your dietary instructions. Pay careful attention to the amount of protein you eat. Your daily diet should emphasize fruits, vegetables, whole grains, and fat-free or low-fat milk products. Discuss the use of coffee, vitamin C, and cherries with your caregiver or dietitian. These may be helpful in lowering uric acid levels.  Maintain a healthy body weight. SEEK MEDICAL CARE IF:   You develop diarrhea, vomiting, or any side effects from medicines.  You do not feel better in 24 hours, or you are getting worse. SEEK IMMEDIATE MEDICAL CARE IF:   Your joint becomes suddenly more tender, and you have chills or a fever. MAKE SURE YOU:   Understand these instructions.  Will watch your condition.  Will get help right away if you are not doing well or get worse. Document Released: 03/06/2000 Document Revised: 07/24/2013 Document Reviewed: 10/21/2011 [facility] Patient Information 2015 [place], [place]. This information is not intended to replace advice given to you by your health care provider. Make  sure you discuss any questions you have with your health care provider.  [employer] Patient Information 2015 [place]. This information is not intended to replace advice given to you by your health care provider. Make sure you discuss any questions you have with your health care provider.

## 2014-09-21 ENCOUNTER — Encounter (HOSPITAL_COMMUNITY): Payer: Self-pay | Admitting: *Deleted

## 2014-09-21 ENCOUNTER — Emergency Department (HOSPITAL_COMMUNITY): Payer: BLUE CROSS/BLUE SHIELD

## 2014-09-21 ENCOUNTER — Observation Stay (HOSPITAL_COMMUNITY)
Admission: EM | Admit: 2014-09-21 | Discharge: 2014-09-23 | Disposition: A | Discharge status: Home / Self Care | Payer: BLUE CROSS/BLUE SHIELD | Attending: Internal Medicine | Admitting: Internal Medicine

## 2014-09-21 DIAGNOSIS — I509 Heart failure, unspecified: Secondary | ICD-10-CM | POA: Diagnosis not present

## 2014-09-21 DIAGNOSIS — R079 Chest pain, unspecified: Secondary | ICD-10-CM | POA: Diagnosis present

## 2014-09-21 DIAGNOSIS — N179 Acute kidney failure, unspecified: Secondary | ICD-10-CM | POA: Diagnosis not present

## 2014-09-21 DIAGNOSIS — Z79899 Other long term (current) drug therapy: Secondary | ICD-10-CM | POA: Insufficient documentation

## 2014-09-21 DIAGNOSIS — R0789 Other chest pain: Principal | ICD-10-CM | POA: Insufficient documentation

## 2014-09-21 DIAGNOSIS — Z720 Tobacco use: Secondary | ICD-10-CM | POA: Diagnosis not present

## 2014-09-21 DIAGNOSIS — R002 Palpitations: Secondary | ICD-10-CM | POA: Insufficient documentation

## 2014-09-21 DIAGNOSIS — R200 Anesthesia of skin: Secondary | ICD-10-CM | POA: Insufficient documentation

## 2014-09-21 DIAGNOSIS — K6389 Other specified diseases of intestine: Secondary | ICD-10-CM | POA: Insufficient documentation

## 2014-09-21 DIAGNOSIS — I5020 Unspecified systolic (congestive) heart failure: Secondary | ICD-10-CM | POA: Diagnosis present

## 2014-09-21 DIAGNOSIS — R202 Paresthesia of skin: Secondary | ICD-10-CM | POA: Insufficient documentation

## 2014-09-21 DIAGNOSIS — N184 Chronic kidney disease, stage 4 (severe): Secondary | ICD-10-CM | POA: Diagnosis present

## 2014-09-21 DIAGNOSIS — M109 Gout, unspecified: Secondary | ICD-10-CM | POA: Diagnosis present

## 2014-09-21 DIAGNOSIS — I10 Essential (primary) hypertension: Secondary | ICD-10-CM | POA: Diagnosis present

## 2014-09-21 DIAGNOSIS — R52 Pain, unspecified: Secondary | ICD-10-CM

## 2014-09-21 DIAGNOSIS — M25562 Pain in left knee: Secondary | ICD-10-CM | POA: Diagnosis present

## 2014-09-21 LAB — CBC WITH DIFFERENTIAL/PLATELET
Basophils Absolute: 0 10*3/uL (ref 0.0–0.1)
Basophils Relative: 0 % (ref 0–1)
Eosinophils Absolute: 0.2 10*3/uL (ref 0.0–0.7)
Eosinophils Relative: 2 % (ref 0–5)
HCT: 41.1 % (ref 39.0–52.0)
Hemoglobin: 13.1 g/dL (ref 13.0–17.0)
Lymphocytes Relative: 29 % (ref 12–46)
Lymphs Abs: 3.5 10*3/uL (ref 0.7–4.0)
MCH: 26 pg (ref 26.0–34.0)
MCHC: 31.9 g/dL (ref 30.0–36.0)
MCV: 81.5 fL (ref 78.0–100.0)
Monocytes Absolute: 0.8 10*3/uL (ref 0.1–1.0)
Monocytes Relative: 6 % (ref 3–12)
Neutro Abs: 7.8 10*3/uL — ABNORMAL HIGH (ref 1.7–7.7)
Neutrophils Relative %: 63 % (ref 43–77)
Platelets: 300 10*3/uL (ref 150–400)
RBC: 5.04 MIL/uL (ref 4.22–5.81)
RDW: 15.9 % — ABNORMAL HIGH (ref 11.5–15.5)
WBC: 12.3 10*3/uL — ABNORMAL HIGH (ref 4.0–10.5)

## 2014-09-21 LAB — BASIC METABOLIC PANEL
Anion gap: 12 (ref 5–15)
BUN: 57 mg/dL — ABNORMAL HIGH (ref 6–20)
CO2: 27 mmol/L (ref 22–32)
Calcium: 8.9 mg/dL (ref 8.9–10.3)
Chloride: 98 mmol/L — ABNORMAL LOW (ref 101–111)
Creatinine, Ser: 3.63 mg/dL — ABNORMAL HIGH (ref 0.61–1.24)
GFR calc Af Amer: 20 mL/min — ABNORMAL LOW (ref >60)
GFR calc non Af Amer: 18 mL/min — ABNORMAL LOW (ref >60)
Glucose, Bld: 124 mg/dL — ABNORMAL HIGH (ref 65–99)
Potassium: 3.7 mmol/L (ref 3.5–5.1)
Sodium: 137 mmol/L (ref 135–145)

## 2014-09-21 LAB — BRAIN NATRIURETIC PEPTIDE: B Natriuretic Peptide: 22.4 pg/mL (ref 0.0–100.0)

## 2014-09-21 LAB — TROPONIN I
Troponin I: <0.03 ng/mL (ref <0.031)
Troponin I: <0.03 ng/mL (ref <0.031)

## 2014-09-21 MED ORDER — NICOTINE 7 MG/24HR TD PT24: 7.0000 mg | MEDICATED_PATCH | Freq: Every day | TRANSDERMAL | Status: DC

## 2014-09-21 MED ORDER — SODIUM CHLORIDE 0.9 % IJ SOLN
3.0000 mL | Freq: Two times a day (BID) | INTRAMUSCULAR | Status: DC
  Administered 2014-09-21 – 2014-09-22 (×2): 3 mL via INTRAVENOUS

## 2014-09-21 MED ORDER — ACETAMINOPHEN 650 MG RE SUPP: 650.0000 mg | Freq: Four times a day (QID) | RECTAL | Status: DC | PRN

## 2014-09-21 MED ORDER — ONDANSETRON HCL 4 MG/2ML IJ SOLN: 4.0000 mg | Freq: Four times a day (QID) | INTRAMUSCULAR | Status: DC | PRN

## 2014-09-21 MED ORDER — ALUM & MAG HYDROXIDE-SIMETH 200-200-20 MG/5ML PO SUSP
30.0000 mL | Freq: Four times a day (QID) | ORAL | Status: DC | PRN
Start: 2014-09-21 — End: 2014-09-23

## 2014-09-21 MED ORDER — OXYCODONE HCL 5 MG PO TABS
5.0000 mg | ORAL_TABLET | ORAL | Status: DC | PRN
  Administered 2014-09-21 – 2014-09-22 (×2): 5 mg via ORAL
  Filled 2014-09-21 (×2): qty 1

## 2014-09-21 MED ORDER — NITROGLYCERIN 2 % TD OINT
1.0000 [in_us] | TOPICAL_OINTMENT | Freq: Four times a day (QID) | TRANSDERMAL | Status: DC
  Administered 2014-09-21 – 2014-09-23 (×6): 1 [in_us] via TOPICAL
  Filled 2014-09-21: qty 1
  Filled 2014-09-21: qty 30

## 2014-09-21 MED ORDER — ONDANSETRON HCL 4 MG PO TABS: 4.0000 mg | ORAL_TABLET | Freq: Four times a day (QID) | ORAL | Status: DC | PRN

## 2014-09-21 MED ORDER — ENOXAPARIN SODIUM 40 MG/0.4ML ~~LOC~~ SOLN
40.0000 mg | Freq: Every day | SUBCUTANEOUS | Status: DC
  Administered 2014-09-21 – 2014-09-22 (×2): 40 mg via SUBCUTANEOUS
  Filled 2014-09-21 (×2): qty 0.4

## 2014-09-21 MED ORDER — ACETAMINOPHEN 325 MG PO TABS
650.0000 mg | ORAL_TABLET | Freq: Four times a day (QID) | ORAL | Status: DC | PRN
Start: 2014-09-21 — End: 2014-09-23

## 2014-09-21 MED ORDER — HYDROMORPHONE HCL 1 MG/ML IJ SOLN: 0.5000 mg | INTRAMUSCULAR | Status: DC | PRN

## 2014-09-21 MED ORDER — SODIUM CHLORIDE 0.9 % IV SOLN
INTRAVENOUS | Status: DC
Start: 2014-09-21 — End: 2014-09-22
  Administered 2014-09-21: via INTRAVENOUS

## 2014-09-21 MED ORDER — TIZANIDINE HCL 4 MG PO TABS
4.0000 mg | ORAL_TABLET | Freq: Two times a day (BID) | ORAL | Status: DC | PRN
  Administered 2014-09-22 (×2): 4 mg via ORAL
  Filled 2014-09-21 (×4): qty 1

## 2014-09-21 MED ORDER — CARVEDILOL 12.5 MG PO TABS
12.5000 mg | ORAL_TABLET | Freq: Two times a day (BID) | ORAL | Status: DC
  Administered 2014-09-22 – 2014-09-23 (×3): 12.5 mg via ORAL
  Filled 2014-09-21 (×3): qty 1

## 2014-09-21 MED ORDER — HYDRALAZINE HCL 25 MG PO TABS
37.5000 mg | ORAL_TABLET | Freq: Three times a day (TID) | ORAL | Status: DC
  Administered 2014-09-21 – 2014-09-23 (×4): 37.5 mg via ORAL
  Filled 2014-09-21 (×4): qty 2

## 2014-09-21 MED ORDER — NICOTINE 7 MG/24HR TD PT24
7.0000 mg | MEDICATED_PATCH | Freq: Every day | TRANSDERMAL | Status: DC
  Filled 2014-09-21 (×2): qty 1

## 2014-09-21 MED ORDER — ASPIRIN 325 MG PO TABS
325.0000 mg | ORAL_TABLET | Freq: Every day | ORAL | Status: DC
  Administered 2014-09-21 – 2014-09-23 (×3): 325 mg via ORAL
  Filled 2014-09-21 (×3): qty 1

## 2014-09-21 NOTE — ED Notes (Signed)
Patient transported to X-ray 

## 2014-09-21 NOTE — ED Notes (Signed)
The pt is c/o some chest discomfort for 2 days and some sob that he has always.  He has some rt leg numbness in huis rt leg  And more pain lt leg numbness and his lt great toe    This just started today

## 2014-09-21 NOTE — ED Notes (Signed)
Family at bedside. 

## 2014-09-21 NOTE — ED Notes (Signed)
Pt given [place] sandwich  To eat

## 2014-09-21 NOTE — H&P (Signed)
[facility] Admission History and Physical       Craig Flynn [id number] DOB: 04-Nov-1960 DOA: 09/21/2014  Referring physician:  PCP: Pcp Not In System  Specialists:   Chief Complaint: Chest Pain and Palpitations  HPI: Craig Flynn is a 54 y.o. male with a history of Systolic CHF, HTN, and Stage IV CKD who presents to the ED with complaints of intermittent Dull chest pain and occasional palpitations x 2-3 days.   He also reports that he began to have numbness and tingling in his left leg this afternoon, now he reports having pain in the LLE.   He deneis any SOB or nausea or vomiting or diaphoresis.   He was evaluated in the ED and found to have a negative Troponin, but his labs did reveal and increase in his BUN/Cr from his baseline and he was referred for medical admission.     Review of Systems:  Constitutional: No Weight Loss, No Weight Gain, Night Sweats, Fevers, Chills, Dizziness, Light Headedness, Fatigue, or Generalized Weakness HEENT: No Headaches, Difficulty Swallowing,Tooth/Dental Problems,Sore Throat,  No Sneezing, Rhinitis, Ear Ache, Nasal Congestion, or Post Nasal Drip,  Cardio-vascular:  +Chest pain, Orthopnea, PND, Edema in Lower Extremities, Anasarca, Dizziness, +Palpitations  Resp: No Dyspnea, No DOE, No Productive Cough, No Non-Productive Cough, No Hemoptysis, No Wheezing.    GI: No Heartburn, Indigestion, Abdominal Pain, Nausea, Vomiting, Diarrhea, Constipation, Hematemesis, Hematochezia, Melena, Change in Bowel Habits,  Loss of Appetite  GU: No Dysuria, No Change in Color of Urine, No Urgency or Urinary Frequency, No Flank pain.  Musculoskeletal: No Joint Pain or Swelling, No Decreased Range of Motion, No Back Pain.  Neurologic: No Syncope, No Seizures, Muscle Weakness, Paresthesia, Vision Disturbance or Loss, No Diplopia, No Vertigo, No Difficulty Walking,  Skin: No Rash or Lesions. Psych: No Change in Mood or Affect, No Depression or Anxiety, No Memory  loss, No Confusion, or Hallucinations   Past Medical History  Diagnosis Date  . Hypertension   . Gout   . 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 CHF (congestive heart failure)      Past Surgical History  Procedure Laterality Date  . Cholecystectomy        Prior to Admission medications   Medication Sig Start Date End Date Taking? Authorizing Provider  carvedilol (COREG) 12.5 MG tablet Take 1 tablet (12.5 mg total) by mouth 2 (two) times daily with a meal. 11/19/13  Yes Debbe Odea, MD  furosemide (LASIX) 20 MG tablet Take 2 tablets (40 mg total) by mouth 2 (two) times daily. 09/14/14  Yes Merryl Hacker, MD  hydrALAZINE (APRESOLINE) 25 MG tablet Take 1.5 tablets (37.5 mg total) by mouth every 8 (eight) hours. 11/19/13  Yes Debbe Odea, MD  HYDROcodone-acetaminophen (NORCO/VICODIN) 5-325 MG per tablet Take 1-2 tablets by mouth every 6 (six) hours as needed for moderate pain. 09/14/14  Yes Merryl Hacker, MD  ibuprofen (ADVIL,MOTRIN) 200 MG tablet Take 400-800 mg by mouth every 6 (six) hours as needed for moderate pain (pain).   Yes Historical Provider, MD  isosorbide mononitrate (IMDUR) 30 MG 24 hr tablet Take 1 tablet (30 mg total) by mouth daily. 11/19/13  Yes Debbe Odea, MD  naproxen (NAPROSYN) 500 MG tablet Take 500 mg by mouth 2 (two) times daily as needed for mild pain or moderate pain.  07/16/14  Yes Historical Provider, MD  potassium chloride SA (K-DUR,KLOR-CON) 20  MEQ tablet Take 1 tablet (20 mEq total) by mouth 2 (two) times daily. 11/19/13  Yes Debbe Odea, MD  tiZANidine (ZANAFLEX) 4 MG tablet take 1 tablet by mouth twice a day as needed SPASM/PAIN 07/16/14  Yes Historical Provider, MD  methylPREDNISolone (MEDROL DOSEPAK) 4 MG TBPK tablet Take as directed on packet. Patient not taking: Reported on 09/21/2014 09/14/14   Merryl Hacker, MD  oxyCODONE-acetaminophen (PERCOCET) 5-325 MG per tablet Take 1 tablet  by mouth every 4 (four) hours as needed for severe pain. Patient not taking: Reported on 09/21/2014 07/08/14   Daleen Bo, MD     No Known Allergies  Social History:  reports that he has been smoking Cigarettes.  He has a 15 pack-year smoking history. He has never used smokeless tobacco. He reports that he does not drink alcohol or use illicit drugs.    Family History  Problem Relation Age of Onset  . Stroke Mother   . Pneumonia Father     died of Pneumonia x3  . CAD Neg Hx        Physical Exam:  GEN:  Pleasant  Morbidly Obese 54 y.o. African American male examined and in no acute distress; cooperative with exam Filed Vitals:   09/21/14 1845 09/21/14 1900 09/21/14 1925 09/21/14 1930  BP: 136/71 148/79 148/79 122/71  Pulse:   77   Temp:      Resp: 25 19 17 24   Height:      Weight:      SpO2:       Blood pressure 122/71, pulse 77, temperature 98.4 F (36.9 C), resp. rate 24, height 6\' 2"  (1.88 m), weight 146.087 kg (322 lb 1 oz), SpO2 99 %. PSYCH: SHe is alert and oriented x4; does not appear anxious does not appear depressed; affect is normal HEENT: Normocephalic and Atraumatic, Mucous membranes pink; PERRLA; EOM intact; Fundi:  Benign;  No scleral icterus, Nares: Patent, Oropharynx: Clear, Fair Dentition,    Neck:  FROM, No Cervical Lymphadenopathy nor Thyromegaly or Carotid Bruit; No JVD; Breasts:: Not examined CHEST WALL: No tenderness CHEST: Normal respiration, clear to auscultation bilaterally HEART: Regular rate and rhythm; no murmurs rubs or gallops BACK: No kyphosis or scoliosis; No CVA tenderness ABDOMEN: Positive Bowel Sounds, Obese, Soft Non-Tender, No Rebound or Guarding; No Masses, No Organomegaly, No Pannus. Rectal Exam: Not done EXTREMITIES: No Cyanosis, Clubbing, or Edema; No Ulcerations. Genitalia: not examined PULSES: 2+ and symmetric SKIN: Normal hydration no rash or ulceration CNS:  Alert and Oriented x 4, No Focal Deficits Vascular: pulses palpable  throughout    Labs on Admission:  Basic Metabolic Panel:  Recent Labs Lab 09/21/14 1631  NA 137  K 3.7  CL 98*  CO2 27  GLUCOSE 124*  BUN 57*  CREATININE 3.63*  CALCIUM 8.9   Liver Function Tests: No results for input(s): AST, ALT, ALKPHOS, BILITOT, PROT, ALBUMIN in the last 168 hours. No results for input(s): LIPASE, AMYLASE in the last 168 hours. No results for input(s): AMMONIA in the last 168 hours. CBC:  Recent Labs Lab 09/21/14 1631  WBC 12.3*  NEUTROABS 7.8*  HGB 13.1  HCT 41.1  MCV 81.5  PLT 300   Cardiac Enzymes:  Recent Labs Lab 09/21/14 1631  TROPONINI <0.03    BNP (last 3 results)  Recent Labs  07/07/14 2253 09/13/14 2209 09/21/14 1631  BNP 23.0 41.4 22.4    ProBNP (last 3 results)  Recent Labs  11/16/13 2005  PROBNP 2888.0*  CBG: No results for input(s): GLUCAP in the last 168 hours.  Radiological Exams on Admission: Dg Chest 2 View  09/21/2014   CLINICAL DATA:  Chest pain.  EXAM: CHEST  2 VIEW  COMPARISON:  September 13, 2014.  FINDINGS: The heart size and mediastinal contours are within normal limits. Both lungs are clear. No pneumothorax or pleural effusion is noted. The visualized skeletal structures are unremarkable.  IMPRESSION: No active cardiopulmonary disease.   Electronically Signed   By: Marijo Conception, M.D.   On: 09/21/2014 17:08     EKG: Independently reviewed.  Normal Sinus Rhythm Rate =           Assessment/Plan:     54 y.o. male with  Principal Problem:   1.   Chest pain   Cardiac Monitoring   Cycle Troponins   ASA,    Active Problems:   2.   AKI (acute kidney injury)   IVFs   Hold Lasix   Monitor BUN/Cr ( Baseline Cr =3.1)     3.   HTN (hypertension)   Continue Carvedilol, and Hydralazine        4.   CKD (chronic kidney disease) stage 4, GFR 15-29 ml/min   Monitor BUN/Cr     5.   Systolic CHF   Continue Carvedilol,    Holding Lasix and Kl due to #2       6.   Paresthesia of left leg   Pain  Control   Monitor symptoms       7.   Tobacco Use Disorder   Nicotine patch    Encouraged to continue to decrease until quit     8.   DVT Prophylaxis   Lovenox    Code Status:     FULL CODE      Family Communication:   Wife at Bedside     Disposition Plan:   Observation Status        Time spent:  [place] [facility] Pager [phone]   If [place] Please Contact the Day Rounding Team MD for [facility]  If 7PM-7AM, Please Contact Night-Floor Coverage  [email or web address] Password [email or web address] 09/21/2014, 8:17 PM     ADDENDUM:   Patient was seen and examined on 09/21/2014

## 2014-09-21 NOTE — ED Provider Notes (Signed)
CSN: ID:[id number]     Arrival date & time 09/21/14  1604 History   First MD Initiated Contact with Patient 09/21/14 1629     Chief Complaint  Patient presents with  . Chest Pain   The history is provided by the patient.  Pt is a 54 yo male with a hx of gout, HTN, and CHF presenting for feelings of possible chest pain and leg numbness.  Reports feeling no frank chest pain but feeling that his "heart is moving funny" in his chest.  Denies any radiation or agrevating/alleviating factors.  Has occurred three times over last two days while he has been at rest.  Admits to not taking one of his medications but unsure what it is called.  During last episode he had bilateral LE numbness that lasted for 5 minutes but resolved on own.  No complains of left great to pain and knee pain.  Denies SOB, presyncopal episodes, difficulty speaking, leg swelling or unilateral weakness/numbness.   Past Medical History  Diagnosis Date  . Hypertension   . Gout   . 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 CHF (congestive heart failure)    Past Surgical History  Procedure Laterality Date  . Cholecystectomy     Family History  Problem Relation Age of Onset  . Stroke Mother   . Pneumonia Father     died of Pneumonia x3  . CAD Neg Hx    History  Substance Use Topics  . Smoking status: Current Every Day Smoker -- 0.50 packs/day for 30 years    Types: Cigarettes  . Smokeless tobacco: Never Used  . Alcohol Use: No    Review of Systems  Constitutional: Negative for fever and chills.  HENT: Negative for congestion and sore throat.   Eyes: Negative for pain.  Respiratory: Negative for cough and shortness of breath.   Cardiovascular: Positive for palpitations. Negative for chest pain and leg swelling.  Gastrointestinal: Negative for nausea, vomiting, abdominal pain and diarrhea.  Endocrine: Negative.   Genitourinary: Negative for flank pain.    Musculoskeletal: Positive for arthralgias (left knee and great to pain). Negative for back pain and neck pain.  Skin: Negative for rash.  Allergic/Immunologic: Negative.   Neurological: Positive for numbness. Negative for dizziness, seizures, syncope, speech difficulty and light-headedness.  Psychiatric/Behavioral: Negative for confusion.      Allergies  Review of patient's allergies indicates no known allergies.  Home Medications   Prior to Admission medications   Medication Sig Start Date End Date Taking? Authorizing Provider  carvedilol (COREG) 12.5 MG tablet Take 1 tablet (12.5 mg total) by mouth 2 (two) times daily with a meal. 11/19/13  Yes Debbe Odea, MD  furosemide (LASIX) 20 MG tablet Take 2 tablets (40 mg total) by mouth 2 (two) times daily. 09/14/14  Yes Merryl Hacker, MD  hydrALAZINE (APRESOLINE) 25 MG tablet Take 1.5 tablets (37.5 mg total) by mouth every 8 (eight) hours. 11/19/13  Yes Debbe Odea, MD  HYDROcodone-acetaminophen (NORCO/VICODIN) 5-325 MG per tablet Take 1-2 tablets by mouth every 6 (six) hours as needed for moderate pain. 09/14/14  Yes Merryl Hacker, MD  ibuprofen (ADVIL,MOTRIN) 200 MG tablet Take 400-800 mg by mouth every 6 (six) hours as needed for moderate pain (pain).   Yes Historical Provider, MD  isosorbide mononitrate (IMDUR) 30 MG 24 hr tablet Take 1 tablet (30 mg total) by mouth daily. 11/19/13  Yes  Debbe Odea, MD  naproxen (NAPROSYN) 500 MG tablet Take 500 mg by mouth 2 (two) times daily as needed for mild pain or moderate pain.  07/16/14  Yes Historical Provider, MD  potassium chloride SA (K-DUR,KLOR-CON) 20 MEQ tablet Take 1 tablet (20 mEq total) by mouth 2 (two) times daily. 11/19/13  Yes Debbe Odea, MD  tiZANidine (ZANAFLEX) 4 MG tablet take 1 tablet by mouth twice a day as needed SPASM/PAIN 07/16/14  Yes Historical Provider, MD  methylPREDNISolone (MEDROL DOSEPAK) 4 MG TBPK tablet Take as directed on packet. Patient not taking: Reported on  09/21/2014 09/14/14   Merryl Hacker, MD  oxyCODONE-acetaminophen (PERCOCET) 5-325 MG per tablet Take 1 tablet by mouth every 4 (four) hours as needed for severe pain. Patient not taking: Reported on 09/21/2014 07/08/14   Daleen Bo, MD   BP 92/48 mmHg  Pulse 75  Temp(Src) 98 F (36.7 C) (Oral)  Resp 20  Ht 6\' 2"  (1.88 m)  Wt 318 lb 11.2 oz (144.561 kg)  BMI 40.90 kg/m2  SpO2 91% Physical Exam  Constitutional: He is oriented to person, place, and time. He appears well-developed and well-nourished.  HENT:  Head: Normocephalic and atraumatic.  Eyes: Conjunctivae and EOM are normal. Pupils are equal, round, and reactive to light.  Neck: Normal range of motion. Neck supple.  Cardiovascular: Normal rate, regular rhythm, normal heart sounds and intact distal pulses.   Pulses:      Dorsalis pedis pulses are 2+ on the right side, and 2+ on the left side.       Posterior tibial pulses are 2+ on the right side, and 2+ on the left side.  LE +1 pitting edema bilatearlly  Pulmonary/Chest: Effort normal and breath sounds normal. No respiratory distress.  Abdominal: Soft. Bowel sounds are normal. There is no tenderness.  Musculoskeletal: Normal range of motion.       Left upper leg: He exhibits tenderness. He exhibits no bony tenderness, no swelling, no edema, no deformity and no laceration.       Legs:      Left foot: There is tenderness and bony tenderness. There is normal range of motion, no swelling, normal capillary refill, no crepitus, no deformity and no laceration.       Feet:  LLE neurovascularly intact.   Neurological: He is alert and oriented to person, place, and time. He has normal strength and normal reflexes. He displays normal reflexes. No cranial nerve deficit or sensory deficit. He displays a negative Romberg sign. GCS eye subscore is 4. GCS verbal subscore is 5. GCS motor subscore is 6.  Normal finger to nose bilaterally.  Rapid alternating movements intact bilaterally.    Normal heal to shin bilaterally.   No pronator drift bilaterally.   Skin: Skin is warm and dry.    ED Course  Procedures (including critical care time) Labs Review Labs Reviewed  BASIC METABOLIC PANEL - Abnormal; Notable for the following:    Chloride 98 (*)    Glucose, Bld 124 (*)    BUN 57 (*)    Creatinine, Ser 3.63 (*)    GFR calc non Af Amer 18 (*)    GFR calc Af Amer 20 (*)    All other components within normal limits  CBC WITH DIFFERENTIAL/PLATELET - Abnormal; Notable for the following:    WBC 12.3 (*)    RDW 15.9 (*)    Neutro Abs 7.8 (*)    All other components within normal limits  BASIC METABOLIC  PANEL - Abnormal; Notable for the following:    Chloride 98 (*)    Glucose, Bld 137 (*)    BUN 56 (*)    Creatinine, Ser 3.55 (*)    Calcium 8.6 (*)    GFR calc non Af Amer 18 (*)    GFR calc Af Amer 21 (*)    All other components within normal limits  CBC - Abnormal; Notable for the following:    Hemoglobin 12.5 (*)    MCH 25.9 (*)    RDW 16.2 (*)    All other components within normal limits  BRAIN NATRIURETIC PEPTIDE  TROPONIN I  TROPONIN I  TROPONIN I  TROPONIN I  URIC ACID  SEDIMENTATION RATE  C-REACTIVE PROTEIN  PARATHYROID HORMONE, INTACT (NO CA)  PROTEIN ELECTROPHORESIS, SERUM  IMMUNOFIXATION ELECTROPHORESIS, URINE (WITH TOT PROT)    Imaging Review Dg Chest 2 View  09/21/2014   CLINICAL DATA:  Chest pain.  EXAM: CHEST  2 VIEW  COMPARISON:  September 13, 2014.  FINDINGS: The heart size and mediastinal contours are within normal limits. Both lungs are clear. No pneumothorax or pleural effusion is noted. The visualized skeletal structures are unremarkable.  IMPRESSION: No active cardiopulmonary disease.   Electronically Signed   By: Marijo Conception, M.D.   On: 09/21/2014 17:08     EKG Interpretation   Date/Time:  Friday September 21 2014 16:12:59 EDT Ventricular Rate:  82 PR Interval:  170 QRS Duration: 96 QT Interval:  390 QTC Calculation: 455 R Axis:    58 Text Interpretation:  Normal sinus rhythm Nonspecific ST and T wave  abnormality Abnormal ECG Sinus rhythm ST-t wave abnormality No significant  change since last tracing Abnormal ekg Confirmed by Carmin Muskrat  MD  [phone]) on 09/21/2014 4:51:46 PM      MDM   Final diagnoses:  Atypical chest pain    Pt is a 54 yo male with HTN and CHF presenting for feeling of possible chest pain with episode of bilateral LE numbness and tingling today.  Self limited and no previous similar episodes.  Denies any frank chest pain or pressure or palpitations with presyncopal episodes.  No SOB or increased leg swelling.    Ddx arrhythmia, CHF exacerbation, ACS, dissection, PE, TIA/Stroke, DM.   On evaluation pt HDS and in NAD.  EKG showing non-specific ST and T wave changes but consistent with previous.  Troponin negative. No frank CP and doubt ACS.  No arrhythmia or arrhythmogenic potential on EKG.  BNP 22.4 with no pulm edema on exam or CXR.  CXR with no widened mediastinum, equal pulses in all extremities.  Normotensive and doubt dissection.  Pt with pain behind left knee but no frank calf pain, increased swelling, or discoloration.  Hx of gout and knee pain in past.  Doubt DVT/PE at this point.  Reports negative dopplers in the past.  Not able to [facility] due to age.  Well's low risk with no hypoxia, tachypnea or tachycardia.   Doubt PE.  No focal neurologic deficits on exam and bilateral numbness today.  Doubt TIA/stroke. Cr found to be elevated >30% of baseline at 3.63 and BUN of 57.  Admitted to observation for careful hydration due to concurrent CHF and atypical CP.  Possible DVT US as inpatient.  Left great toe pain consistent with gout with no signs of overlying cellulitis or septic joint.   If performed, labs, EKGs, and imaging were reviewed/interpreted by myself and my attending and incorporated into medical  decision making.  Discussed pertinent finding with patient or caregiver prior to admission with no  further questions.  Pt care supervised by my attending Dr. Berline Chough, MD PGY-2  Emergency Medicine     Geronimo Boot, MD 09/22/14 [place], MD 09/25/14 1028

## 2014-09-22 ENCOUNTER — Observation Stay (HOSPITAL_COMMUNITY): Payer: BLUE CROSS/BLUE SHIELD

## 2014-09-22 ENCOUNTER — Observation Stay (HOSPITAL_BASED_OUTPATIENT_CLINIC_OR_DEPARTMENT_OTHER): Payer: BLUE CROSS/BLUE SHIELD

## 2014-09-22 DIAGNOSIS — I5022 Chronic systolic (congestive) heart failure: Secondary | ICD-10-CM

## 2014-09-22 DIAGNOSIS — R079 Chest pain, unspecified: Secondary | ICD-10-CM | POA: Diagnosis not present

## 2014-09-22 DIAGNOSIS — I10 Essential (primary) hypertension: Secondary | ICD-10-CM

## 2014-09-22 DIAGNOSIS — N184 Chronic kidney disease, stage 4 (severe): Secondary | ICD-10-CM | POA: Diagnosis not present

## 2014-09-22 DIAGNOSIS — M25562 Pain in left knee: Secondary | ICD-10-CM | POA: Diagnosis present

## 2014-09-22 DIAGNOSIS — N179 Acute kidney failure, unspecified: Secondary | ICD-10-CM

## 2014-09-22 DIAGNOSIS — M10062 Idiopathic gout, left knee: Secondary | ICD-10-CM

## 2014-09-22 LAB — TROPONIN I
Troponin I: <0.03 ng/mL (ref <0.031)
Troponin I: <0.03 ng/mL (ref <0.031)

## 2014-09-22 LAB — CBC
HCT: 39.7 % (ref 39.0–52.0)
Hemoglobin: 12.5 g/dL — ABNORMAL LOW (ref 13.0–17.0)
MCH: 25.9 pg — ABNORMAL LOW (ref 26.0–34.0)
MCHC: 31.5 g/dL (ref 30.0–36.0)
MCV: 82.2 fL (ref 78.0–100.0)
Platelets: 245 10*3/uL (ref 150–400)
RBC: 4.83 MIL/uL (ref 4.22–5.81)
RDW: 16.2 % — ABNORMAL HIGH (ref 11.5–15.5)
WBC: 10.3 10*3/uL (ref 4.0–10.5)

## 2014-09-22 LAB — BASIC METABOLIC PANEL
Anion gap: 9 (ref 5–15)
BUN: 56 mg/dL — ABNORMAL HIGH (ref 6–20)
CO2: 29 mmol/L (ref 22–32)
Calcium: 8.6 mg/dL — ABNORMAL LOW (ref 8.9–10.3)
Chloride: 98 mmol/L — ABNORMAL LOW (ref 101–111)
Creatinine, Ser: 3.55 mg/dL — ABNORMAL HIGH (ref 0.61–1.24)
GFR calc Af Amer: 21 mL/min — ABNORMAL LOW (ref >60)
GFR calc non Af Amer: 18 mL/min — ABNORMAL LOW (ref >60)
Glucose, Bld: 137 mg/dL — ABNORMAL HIGH (ref 65–99)
Potassium: 3.6 mmol/L (ref 3.5–5.1)
Sodium: 136 mmol/L (ref 135–145)

## 2014-09-22 LAB — LIPID PANEL
Cholesterol: 148 mg/dL (ref 0–200)
HDL: 23 mg/dL — ABNORMAL LOW (ref >40)
LDL Cholesterol: 60 mg/dL (ref 0–99)
Total CHOL/HDL Ratio: 6.4 RATIO
Triglycerides: 324 mg/dL — ABNORMAL HIGH (ref <150)
VLDL: 65 mg/dL — ABNORMAL HIGH (ref 0–40)

## 2014-09-22 LAB — C-REACTIVE PROTEIN: CRP: 7.6 mg/dL — ABNORMAL HIGH (ref <1.0)

## 2014-09-22 LAB — URIC ACID: Uric Acid, Serum: 16.7 mg/dL — ABNORMAL HIGH (ref 4.4–7.6)

## 2014-09-22 LAB — SEDIMENTATION RATE: Sed Rate: 21 mm/hr — ABNORMAL HIGH (ref 0–16)

## 2014-09-22 MED ORDER — COLCHICINE 0.6 MG PO TABS
0.6000 mg | ORAL_TABLET | Freq: Every day | ORAL | Status: DC
  Administered 2014-09-23: 0.6 mg via ORAL
  Filled 2014-09-22: qty 1

## 2014-09-22 MED ORDER — PREDNISONE 20 MG PO TABS
60.0000 mg | ORAL_TABLET | Freq: Once | ORAL | Status: AC
  Administered 2014-09-22: 60 mg via ORAL
  Filled 2014-09-22: qty 3

## 2014-09-22 MED ORDER — COLCHICINE 0.6 MG PO TABS
1.2000 mg | ORAL_TABLET | Freq: Once | ORAL | Status: AC
  Administered 2014-09-22: 1.2 mg via ORAL
  Filled 2014-09-22: qty 2

## 2014-09-22 MED ORDER — SODIUM CHLORIDE 0.9 % IV SOLN
INTRAVENOUS | Status: DC
  Administered 2014-09-22: 07:00:00 via INTRAVENOUS

## 2014-09-22 MED ORDER — PREDNISONE 20 MG PO TABS
40.0000 mg | ORAL_TABLET | Freq: Every day | ORAL | Status: DC
  Administered 2014-09-23: 40 mg via ORAL
  Filled 2014-09-22 (×2): qty 2

## 2014-09-22 NOTE — Progress Notes (Signed)
[facility]                                                                              Patient Demographics  Craig Flynn, is a 54 y.o. male, DOB - 03-09-1961, FAO:[id number]  Admit date - 09/21/2014   Admitting Physician Theressa Millard, MD  Outpatient Primary MD for the patient is Pcp Not In System  LOS -    Chief Complaint  Patient presents with  . Chest Pain       Brief HPI   Briefly 54 year old male with history of chronic systolic CHF, hypertension, stage IV chronic kidney disease (used to follow Dr. Arty Baumgartner) presented to ED with intermittent dull chest pain and occasional palpitations for the last 2-3 days. He also reported pain in his left lower extremity otherwise denied any shortness of breath, nausea or vomiting or diaphoresis.  At the time of my encounter, patient reported pain in his left knee and left foot, was recently treated with gout last week. Patient reports pain similar sharp and constant typical with his gout attack.   Assessment & Plan    Principal Problem:  Atypical Chest pain improved -Ruled out for acute ACS, follow 2-D echo results  -Continue aspirin, Coreg, follow lipid panel   Active Problems:    left knee pain possibly due to acute Gout - Obtain left knee x-ray, patient was recently treated for gout last week for his foot. Placed on colchicine 1.2 mg 1 and 0.6 mg tomorrow onwards, prednisone. Cannot place on NSAIDs due to acute on chronic kidney disease - Obtain ESR, CRP, uric acid    HTN (hypertension) -Currently stable, continue Coreg      acute onCKD (chronic kidney disease) stage 4, GFR 15-29 ml/min - Not much deviation from his creatinine function levels from last year 2015, needs to be followed by renal service outpatient. He states had seen Dr. Arty Baumgartner 8 years ago. - Needs to be on Lasix for his chronic CHF - Obtain SPEP, UPEP, PTH, renal ultrasound, renal consult. Strongly counseled patient on being  compliant with his medical care.    chronic Systolic CHF - Currently stable, 2-D echo done results pending. Prior echo in 2015 had shown EF of 35-40% with diffuse hypokinesis    Code Status: Full code  Family Communication: Discussed in detail with the patient, all imaging results, lab results explained to the patient and wife  Disposition Plan: Hopefully the scene a.m.  Time Spent in minutes   25 minutes  Procedures  echo  Consults   renal  DVT Prophylaxis   Lovenox   Medications  Scheduled Meds: . aspirin  325 mg Oral Daily  . carvedilol  12.5 mg Oral BID WC  . [START ON 09/23/2014] colchicine  0.6 mg Oral Daily  . colchicine  1.2 mg Oral Once  . enoxaparin (LOVENOX) injection  40 mg Subcutaneous QHS  . hydrALAZINE  37.5 mg Oral 3 times per day  . nicotine  7 mg Transdermal Daily  . nitroGLYCERIN  1 inch Topical 4 times per day  . [START ON 09/23/2014] predniSONE  40 mg Oral Q breakfast  . predniSONE  60 mg Oral Once  . sodium chloride  3 mL Intravenous Q12H   Continuous Infusions: . sodium chloride 75 mL/hr at 09/22/14 0655   PRN Meds:.acetaminophen **OR** acetaminophen, alum & mag hydroxide-simeth, HYDROmorphone (DILAUDID) injection, ondansetron **OR** ondansetron (ZOFRAN) IV, oxyCODONE, tiZANidine   Antibiotics   Anti-infectives    None        Subjective:   Gionni Vaca was seen and examined today.  Patient denies dizziness, chest pain, shortness of breath, abdominal pain, N/V/D/C, new weakness, numbess, tingling. No acute events overnight.  + Complaining of significant left knee pain, unable to bend the knee   Objective:   Blood pressure 92/48, pulse 75, temperature 98 F (36.7 C), temperature source Oral, resp. rate 20, height _0  (1.88 m), weight 144.561 kg (318 lb 11.2 oz), SpO2 91 %.  Wt Readings from Last 3 Encounters:  09/21/14 144.561 kg (318 lb 11.2 oz)  09/13/14 156.945 kg (346 lb)  07/07/14 152.363 kg (335 lb 14.4 oz)     Intake/Output  Summary (Last 24 hours) at 09/22/14 1133 Last data filed at 09/22/14 0901  Gross per 24 hour  Intake  792.5 ml  Output   1050 ml  Net -257.5 ml    Exam  General: Alert and oriented x 3, NAD  HEENT:  PERRLA, EOMI, Anicteric Sclera, mucous membranes moist.   Neck: Supple, no JVD, no masses  CVS: S1 S2 auscultated, no rubs, murmurs or gallops. Regular rate and rhythm.  Respiratory: Clear to auscultation bilaterally, no wheezing, rales or rhonchi  Abdomen: Soft, nontender, nondistended, + bowel sounds  Ext: no cyanosis clubbing or edema, pain in the left knee, ROM dec    Neuro: AAOx3, Cr N's II- XII. Strength 5/5 upper and lower extremities bilaterally  Skin: No rashes  Psych: Normal affect and demeanor, alert and oriented x3    Data Review   Micro Results No results found for this or any previous visit (from the past 240 hour(s)).  Radiology Reports Dg Chest 2 View  09/21/2014   CLINICAL DATA:  Chest pain.  EXAM: CHEST  2 VIEW  COMPARISON:  September 13, 2014.  FINDINGS: The heart size and mediastinal contours are within normal limits. Both lungs are clear. No pneumothorax or pleural effusion is noted. The visualized skeletal structures are unremarkable.  IMPRESSION: No active cardiopulmonary disease.   Electronically Signed   By: Marijo Conception, M.D.   On: 09/21/2014 17:08   Dg Chest 2 View  09/13/2014   CLINICAL DATA:  Bilateral leg swelling for 1 week  EXAM: CHEST  2 VIEW  COMPARISON:  Radiograph 07/07/2014  FINDINGS: Normal cardiac silhouette. There is a fine interstitial pattern similar prior. No pleural fluid. No pneumothorax. No focal infiltrate. Degenerative osteophytosis of the thoracic spine.  IMPRESSION: Fine interstitial pattern suggests mild interstitial edema. No change from prior.   Electronically Signed   By: Suzy Bouchard M.D.   On: 09/13/2014 21:52    CBC  Recent Labs Lab 09/21/14 1631 09/22/14 0823  WBC 12.3* 10.3  HGB 13.1 12.5*  HCT 41.1 39.7  PLT  300 245  MCV 81.5 82.2  MCH 26.0 25.9*  MCHC 31.9 31.5  RDW 15.9* 16.2*  LYMPHSABS 3.5  --   MONOABS 0.8  --   EOSABS 0.2  --   BASOSABS 0.0  --     Chemistries   Recent Labs Lab 09/21/14 1631 09/22/14 0823  NA 137 136  K 3.7 3.6  CL 98* 98*  CO2 27 29  GLUCOSE 124* 137*  BUN 57* 56*  CREATININE 3.63* 3.55*  CALCIUM 8.9 8.6*   ------------------------------------------------------------------------------------------------------------------ estimated creatinine clearance is 36.5 mL/min (by C-G formula based on Cr of 3.55). ------------------------------------------------------------------------------------------------------------------ No results for input(s): HGBA1C in the last 72 hours. ------------------------------------------------------------------------------------------------------------------ No results for input(s): CHOL, HDL, LDLCALC, TRIG, CHOLHDL, LDLDIRECT in the last 72 hours. ------------------------------------------------------------------------------------------------------------------ No results for input(s): TSH, T4TOTAL, T3FREE, THYROIDAB in the last 72 hours.  Invalid input(s): FREET3 ------------------------------------------------------------------------------------------------------------------ No results for input(s): VITAMINB12, FOLATE, FERRITIN, TIBC, IRON, RETICCTPCT in the last 72 hours.  Coagulation profile No results for input(s): INR, PROTIME in the last 168 hours.  No results for input(s): DDIMER in the last 72 hours.  Cardiac Enzymes  Recent Labs Lab 09/21/14 2125 09/22/14 0224 09/22/14 0823  TROPONINI <0.03 <0.03 <0.03   ------------------------------------------------------------------------------------------------------------------ Invalid input(s): POCBNP  No results for input(s): GLUCAP in the last 72 hours.   Melvina Pangelinan M.D. [facility] 09/22/2014, 11:33 AM  Pager: [phone]   Between 7am to 7pm - call  Pager - [phone]  After 7pm go to [email or web address] - password [email or web address]  Call night coverage person covering after 7pm

## 2014-09-22 NOTE — Consult Note (Addendum)
Renal Service Consult Note [facility]  STONY LIPPER 09/22/2014 Roney Jaffe D Requesting Physician:  Dr Tana Coast  Reason for Consult:  CKD needs w/u and outpatient f/u with neph HPI: The patient is a 54 y.o. year-old with hx of HTN, CKD, DM2, syst HF presented to ED yest with chest pain and SOB worsening, acute on chronic SOB/ DOE. bilat LE numbness also. Trop negative, B/CR up from baseline. Admitted and lasix held, CE"s negative. Asked to see for CKD/ AKI.   Patient reports L knee pain, +nsaid's used regularly at home. No SOB, cough, CP, abd pain. Appetite good, no energy issues, no voiding problems. Saw a kidney doctor 8 yrs ago but hasn't followed up.   Home meds: coreg, lasix 40 bid, apresoline, advil, Imdur, naproxen, KCl, Zanaflex  Date  Creat  CKD 2007  2.4  Stage III 2008  1.8- 2.0 Stage III 2010  2.2- 2.3 Stage III 2013  3.4- 3.6 Stage IV 2015  3.0- 3.5 April '16 3.17  Stage IV Jun 23, '16 3.06 09/21/14  3.63 09/22/14  3.55  Stage IV   Chart review: 8/15 - HTN, CKD4, MO with SOB/ leg swelling, orthopnea, pulm edema > rx with diuretics, BP meds. LVEF 35-40% by echo.  9/13 - CKD,HTN, gout obesity, tobacco, CBP w chest pain/ SOB. Had new dx of DM2, started on oral agents. COPD exacerbation. Renal fialure/CKD , referral to nephro if not improving   Past Medical History  Past Medical History  Diagnosis Date  . Hypertension   . Gout   . 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 CHF (congestive heart failure)    Past Surgical History  Past Surgical History  Procedure Laterality Date  . Cholecystectomy     Family History  Family History  Problem Relation Age of Onset  . Stroke Mother   . Pneumonia Father     died of Pneumonia x3  . CAD Neg Hx    Social History  reports that he has been smoking Cigarettes.  He has a 15 pack-year smoking history. He has never used smokeless tobacco. He reports  that he does not drink alcohol or use illicit drugs. Allergies No Known Allergies Home medications Prior to Admission medications   Medication Sig Start Date End Date Taking? Authorizing Provider  carvedilol (COREG) 12.5 MG tablet Take 1 tablet (12.5 mg total) by mouth 2 (two) times daily with a meal. 11/19/13  Yes Debbe Odea, MD  furosemide (LASIX) 20 MG tablet Take 2 tablets (40 mg total) by mouth 2 (two) times daily. 09/14/14  Yes Merryl Hacker, MD  hydrALAZINE (APRESOLINE) 25 MG tablet Take 1.5 tablets (37.5 mg total) by mouth every 8 (eight) hours. 11/19/13  Yes Debbe Odea, MD  HYDROcodone-acetaminophen (NORCO/VICODIN) 5-325 MG per tablet Take 1-2 tablets by mouth every 6 (six) hours as needed for moderate pain. 09/14/14  Yes Merryl Hacker, MD  ibuprofen (ADVIL,MOTRIN) 200 MG tablet Take 400-800 mg by mouth every 6 (six) hours as needed for moderate pain (pain).   Yes Historical Provider, MD  isosorbide mononitrate (IMDUR) 30 MG 24 hr tablet Take 1 tablet (30 mg total) by mouth daily. 11/19/13  Yes Debbe Odea, MD  naproxen (NAPROSYN) 500 MG tablet Take 500 mg by mouth 2 (two) times daily as needed for mild pain or moderate pain.  07/16/14  Yes Historical Provider, MD  potassium chloride SA (K-DUR,KLOR-CON) 20  MEQ tablet Take 1 tablet (20 mEq total) by mouth 2 (two) times daily. 11/19/13  Yes Debbe Odea, MD  tiZANidine (ZANAFLEX) 4 MG tablet take 1 tablet by mouth twice a day as needed SPASM/PAIN 07/16/14  Yes Historical Provider, MD  methylPREDNISolone (MEDROL DOSEPAK) 4 MG TBPK tablet Take as directed on packet. Patient not taking: Reported on 09/21/2014 09/14/14   Merryl Hacker, MD  oxyCODONE-acetaminophen (PERCOCET) 5-325 MG per tablet Take 1 tablet by mouth every 4 (four) hours as needed for severe pain. Patient not taking: Reported on 09/21/2014 07/08/14   Daleen Bo, MD   Liver Function Tests No results for input(s): AST, ALT, ALKPHOS, BILITOT, PROT, ALBUMIN in the last 168  hours. No results for input(s): LIPASE, AMYLASE in the last 168 hours. CBC  Recent Labs Lab 09/21/14 1631 09/22/14 0823  WBC 12.3* 10.3  NEUTROABS 7.8*  --   HGB 13.1 12.5*  HCT 41.1 39.7  MCV 81.5 82.2  PLT 300 [id number]   Basic Metabolic Panel  Recent Labs Lab 09/21/14 1631 09/22/14 0823  NA 137 136  K 3.7 3.6  CL 98* 98*  CO2 27 29  GLUCOSE 124* 137*  BUN 57* 56*  CREATININE 3.63* 3.55*  CALCIUM 8.9 8.6*    Filed Vitals:   09/21/14 2246 09/22/14 0445 09/22/14 0851 09/22/14 1137  BP: 115/53 120/47 92/48 123/54  Pulse: 73 72 75   Temp: 98.5 F (36.9 C) 98.2 F (36.8 C) 98 F (36.7 C)   TempSrc: Oral Oral Oral   Resp:   20   Height: 6\' 2"  (1.88 m)     Weight: 144.561 kg (318 lb 11.2 oz)     SpO2: 100% 91%     Date  Creat  CKD 2007  2.4  Stage III 2008  1.8- 2.0 Stage III 2010  2.2- 2.3 Stage III 2013  3.4- 3.6 Stage IV 2015  3.0- 3.5 April '16 3.17  Stage IV Jun '16  3.06 09/21/14  3.63 09/22/14  3.55  Stage IV  Exam Heavy set AAM no distress, calm , pleasant No rash, cyanosis or gangrene Sclera anicteric, throat clear No jvd Chest clear bilat , no rales or wheezing RRR no MRG Abd obese, soft ntnd no mass or ascites GU normal 1+ edema R leg, no edema L leg Neuro is nf, ox 3  UA - 30 protein, 0-2 rbc/ wbc CXR 7/1 - clear   Assessment: 1. CKD stage IV - has long hx of renal failure, prob related to HTN (25-30 year hx). UA benign, needs [place] and OP followup. Will follow.  2. Atypical CP 3. L knee pain, possible gout 4. HTN on Coreg 5. Systolic CHF on lasix, EF 35-40%   Plan- avoid NSAID's have d/w patient, renal US, dc IVF's.   Kelly Splinter MD (pgr) [phone]    [phone] 09/22/2014, 11:46 AM

## 2014-09-22 NOTE — Progress Notes (Signed)
Echocardiogram 2D Echocardiogram has been performed.  Deloss, Rathel 09/22/2014, 11:09 AM

## 2014-09-23 DIAGNOSIS — M10062 Idiopathic gout, left knee: Secondary | ICD-10-CM | POA: Diagnosis not present

## 2014-09-23 DIAGNOSIS — R079 Chest pain, unspecified: Secondary | ICD-10-CM | POA: Diagnosis not present

## 2014-09-23 DIAGNOSIS — N179 Acute kidney failure, unspecified: Secondary | ICD-10-CM | POA: Diagnosis not present

## 2014-09-23 DIAGNOSIS — N184 Chronic kidney disease, stage 4 (severe): Secondary | ICD-10-CM | POA: Diagnosis not present

## 2014-09-23 LAB — BASIC METABOLIC PANEL
Anion gap: 12 (ref 5–15)
BUN: 58 mg/dL — ABNORMAL HIGH (ref 6–20)
CO2: 25 mmol/L (ref 22–32)
Calcium: 8.7 mg/dL — ABNORMAL LOW (ref 8.9–10.3)
Chloride: 98 mmol/L — ABNORMAL LOW (ref 101–111)
Creatinine, Ser: 3.57 mg/dL — ABNORMAL HIGH (ref 0.61–1.24)
GFR calc Af Amer: 21 mL/min — ABNORMAL LOW (ref >60)
GFR calc non Af Amer: 18 mL/min — ABNORMAL LOW (ref >60)
Glucose, Bld: 189 mg/dL — ABNORMAL HIGH (ref 65–99)
Potassium: 4 mmol/L (ref 3.5–5.1)
Sodium: 135 mmol/L (ref 135–145)

## 2014-09-23 MED ORDER — FENOFIBRATE 54 MG PO TABS: 54.0000 mg | ORAL_TABLET | Freq: Every day | ORAL | Status: DC

## 2014-09-23 MED ORDER — ISOSORBIDE MONONITRATE ER 30 MG PO TB24: 30.0000 mg | ORAL_TABLET | Freq: Every day | ORAL | Status: DC

## 2014-09-23 MED ORDER — ASPIRIN 325 MG PO TABS: 325.0000 mg | ORAL_TABLET | Freq: Every day | ORAL | Status: DC

## 2014-09-23 MED ORDER — POTASSIUM CHLORIDE CRYS ER 20 MEQ PO TBCR: 20.0000 meq | EXTENDED_RELEASE_TABLET | Freq: Two times a day (BID) | ORAL | Status: DC

## 2014-09-23 MED ORDER — FUROSEMIDE 20 MG PO TABS: 40.0000 mg | ORAL_TABLET | Freq: Two times a day (BID) | ORAL | Status: DC

## 2014-09-23 MED ORDER — CARVEDILOL 12.5 MG PO TABS: 12.5000 mg | ORAL_TABLET | Freq: Two times a day (BID) | ORAL | Status: DC

## 2014-09-23 MED ORDER — FENOFIBRATE 54 MG PO TABS
54.0000 mg | ORAL_TABLET | Freq: Every day | ORAL | Status: DC
  Filled 2014-09-23: qty 1

## 2014-09-23 MED ORDER — PREDNISONE 10 MG PO TABS: ORAL_TABLET | ORAL | Status: DC

## 2014-09-23 MED ORDER — COLCHICINE 0.6 MG PO TABS: 0.6000 mg | ORAL_TABLET | Freq: Every day | ORAL | Status: DC

## 2014-09-23 MED ORDER — OXYCODONE HCL 5 MG PO TABS: 5.0000 mg | ORAL_TABLET | Freq: Four times a day (QID) | ORAL | Status: DC | PRN

## 2014-09-23 MED ORDER — ALLOPURINOL 300 MG PO TABS: 150.0000 mg | ORAL_TABLET | Freq: Every day | ORAL | Status: DC

## 2014-09-23 MED ORDER — HYDRALAZINE HCL 25 MG PO TABS: 37.5000 mg | ORAL_TABLET | Freq: Three times a day (TID) | ORAL | Status: DC

## 2014-09-23 MED ORDER — GEMFIBROZIL 600 MG PO TABS: 600.0000 mg | ORAL_TABLET | Freq: Two times a day (BID) | ORAL | Status: DC

## 2014-09-23 NOTE — Discharge Summary (Signed)
Physician Discharge Summary   Patient ID: Craig Flynn MRN: [id number] DOB/AGE: Dec 12, 1960 54 y.o.  Admit date: 09/21/2014 Discharge date: 09/23/2014  Primary Care Physician:  Pcp Not In System  Discharge Diagnoses:    . atypical Chest pain . HTN (hypertension) . AKI (acute kidney injury) . Acute Gout . CKD (chronic kidney disease) stage 4, GFR 15-29 ml/min . Systolic CHF . Left knee pain   Consults:  Nephrology, Dr. Melvia Heaps   Recommendations for Outpatient Follow-up:  Patient was recommended strongly to follow-up with Dr. Arty Baumgartner, his primary nephrologist within next 2 weeks, he needs BMET at the follow-up appointment  TESTS THAT NEED FOLLOW-UP BMET  DIET: Heart healthy diet    Allergies:  No Known Allergies   Discharge Medications:   Medication List    STOP taking these medications        HYDROcodone-acetaminophen 5-325 MG per tablet  Commonly known as:  NORCO/VICODIN     ibuprofen 200 MG tablet  Commonly known as:  ADVIL,MOTRIN     methylPREDNISolone 4 MG Tbpk tablet  Commonly known as:  MEDROL DOSEPAK     naproxen 500 MG tablet  Commonly known as:  NAPROSYN     oxyCODONE-acetaminophen 5-325 MG per tablet  Commonly known as:  PERCOCET      TAKE these medications        allopurinol 300 MG tablet  Commonly known as:  ZYLOPRIM  Take 0.5 tablets (150 mg total) by mouth daily.     aspirin 325 MG tablet  Take 1 tablet (325 mg total) by mouth daily.     carvedilol 12.5 MG tablet  Commonly known as:  COREG  Take 1 tablet (12.5 mg total) by mouth 2 (two) times daily with a meal.     colchicine 0.6 MG tablet  Take 1 tablet (0.6 mg total) by mouth daily.     fenofibrate 54 MG tablet  Take 1 tablet (54 mg total) by mouth daily.     furosemide 20 MG tablet  Commonly known as:  LASIX  Take 2 tablets (40 mg total) by mouth 2 (two) times daily.     hydrALAZINE 25 MG tablet  Commonly known as:  APRESOLINE  Take 1.5 tablets (37.5 mg total) by  mouth every 8 (eight) hours.     isosorbide mononitrate 30 MG 24 hr tablet  Commonly known as:  IMDUR  Take 1 tablet (30 mg total) by mouth daily.     oxyCODONE 5 MG immediate release tablet  Commonly known as:  Oxy IR/ROXICODONE  Take 1 tablet (5 mg total) by mouth every 6 (six) hours as needed for moderate pain or severe pain.     potassium chloride SA 20 MEQ tablet  Commonly known as:  K-DUR,KLOR-CON  Take 1 tablet (20 mEq total) by mouth 2 (two) times daily.     predniSONE 10 MG tablet  Commonly known as:  DELTASONE  Prednisone dosing: Take  Prednisone 24m (4 tabs) x 3 days, then taper to 354m(3 tabs) x 3 days, then 2026m2 tabs) x 3days, then 57m19m tab) x 3days, then OFF.  Start taking on:  09/24/2014     tiZANidine 4 MG tablet  Commonly known as:  ZANAFLEX  take 1 tablet by mouth twice a day as needed SPASM/PAIN         Brief H and P: For complete details please refer to admission H and P, but in brief Briefly 53 y2r old male with history of chronic systolic  CHF, hypertension, stage IV chronic kidney disease (used to follow Dr. Arty Baumgartner) presented to ED with intermittent dull chest pain and occasional palpitations for the last 2-3 days. He also reported pain in his left lower extremity otherwise denied any shortness of breath, nausea or vomiting or diaphoresis.  At the time of my encounter, patient reported pain in his left knee and left foot, was recently treated with gout last week. Patient reports pain similar sharp and constant typical with his gout attack.  Hospital Course:   Atypical Chest pain improved -Ruled out for acute ACS, troponins remain negative, 2-D echo showed EF of 25-05%, grade 1 diastolic dysfunction. -Continue aspirin, Coreg, follow lipid panel   Acute left knee pain due to acute Gout - Left knee x-ray showed moderate joint effusion with interval development of osteoarthritis, patient had a recent gout attack to his left foot which improved  some with prednisone pack. ESR, CRP, uric acid were obtained which were all elevated, uric acid 16.7.  Cannot place on NSAIDs due to acute on chronic kidney disease and CHF. Patient was placed on prednisone with taper, renal dosing of allopurinol, colchicine with significant improvement in his left knee pain.    HTN (hypertension) -Currently stable, continue Coreg    acute onCKD (chronic kidney disease) stage 4, GFR 15-29 ml/min - Not much deviation from his creatinine function levels from last year 2015, needs to be followed by renal service outpatient. He states had seen Dr. Arty Baumgartner 8 years ago. - Needs to be on Lasix for his chronic CHF - Obtained SPEP, UPEP, PTH, results pending - Renal ultrasound showed no obstruction or hydronephrosis. Patient has followed Dr Arty Baumgartner in the past, recommended to follow-up ASAP in 2 weeks and repeat BMET. He was seen by nephrology inpatient by Dr. Jonnie Finner. Strongly counseled patient on being compliant with his medical care.   chronic Systolic CHF - Currently stable, 2-D echo showed a improved EF 55-60% with grade 1 diastolic dysfunction. Prior echo in 2015 had shown EF of 35-40% with diffuse hypokinesis      Day of Discharge BP 150/65 mmHg  Pulse 79  Temp(Src) 98.6 F (37 C) (Oral)  Resp 18  Ht 6' 2"  (1.88 m)  Wt 143.6 kg (316 lb 9.3 oz)  BMI 40.63 kg/m2  SpO2 97%  Physical Exam: General: Alert and awake oriented x3 not in any acute distress. HEENT: anicteric sclera, pupils reactive to light and accommodation CVS: S1-S2 clear no murmur rubs or gallops Chest: clear to auscultation bilaterally, no wheezing rales or rhonchi Abdomen: soft nontender, nondistended, normal bowel sounds Extremities: no cyanosis, clubbing or edema noted bilaterally Neuro: Cranial nerves II-XII intact, no focal neurological deficits   The results of significant diagnostics from this hospitalization (including imaging, microbiology, ancillary and  laboratory) are listed below for reference.    LAB RESULTS: Basic Metabolic Panel:  Recent Labs Lab 09/22/14 0823 09/23/14 0415  NA 136 135  K 3.6 4.0  CL 98* 98*  CO2 29 25  GLUCOSE 137* 189*  BUN 56* 58*  CREATININE 3.55* 3.57*  CALCIUM 8.6* 8.7*   Liver Function Tests: No results for input(s): AST, ALT, ALKPHOS, BILITOT, PROT, ALBUMIN in the last 168 hours. No results for input(s): LIPASE, AMYLASE in the last 168 hours. No results for input(s): AMMONIA in the last 168 hours. CBC:  Recent Labs Lab 09/21/14 1631 09/22/14 0823  WBC 12.3* 10.3  NEUTROABS 7.8*  --   HGB 13.1 12.5*  HCT 41.1 39.7  MCV  81.5 82.2  PLT 300 245   Cardiac Enzymes:  Recent Labs Lab 09/22/14 0224 09/22/14 0823  TROPONINI <0.03 <0.03   BNP: Invalid input(s): POCBNP CBG: No results for input(s): GLUCAP in the last 168 hours.  Significant Diagnostic Studies:  Dg Chest 2 View  09/21/2014   CLINICAL DATA:  Chest pain.  EXAM: CHEST  2 VIEW  COMPARISON:  September 13, 2014.  FINDINGS: The heart size and mediastinal contours are within normal limits. Both lungs are clear. No pneumothorax or pleural effusion is noted. The visualized skeletal structures are unremarkable.  IMPRESSION: No active cardiopulmonary disease.   Electronically Signed   By: Marijo Conception, M.D.   On: 09/21/2014 17:08   US Renal  09/22/2014   CLINICAL DATA:  Acute renal injury.  EXAM: RENAL / URINARY TRACT ULTRASOUND COMPLETE  COMPARISON:  None.  FINDINGS: Right Kidney:  Length: 10.8 cm. There is mild cortical thinning. Echogenicity within normal limits. No mass or hydronephrosis visualized.  Left Kidney:  Length: 10.6 cm. There is mild cortical thinning. Echogenicity within normal limits. No mass or hydronephrosis visualized.  Bladder:  Appears normal for degree of bladder distention.  IMPRESSION: No acute abnormality identified.   Electronically Signed   By: Abelardo Diesel M.D.   On: 09/22/2014 17:33   Dg Knee Complete 4 Views  Left  09/22/2014   CLINICAL DATA:  Left knee pain.  EXAM: LEFT KNEE - COMPLETE 4+ VIEW  COMPARISON:  12/13/2010  FINDINGS: The patient has developed medial joint space narrowing and marginal osteophyte formation in the medial compartment since the prior study. There is new osteophyte formation on the patella. Moderate joint effusion. No fracture or dislocation or bone destruction.  IMPRESSION: Interval development of osteoarthritis.  Moderate joint effusion.   Electronically Signed   By: Lorriane Shire M.D.   On: 09/22/2014 12:46    2D ECHO: Study Conclusions  - Left ventricle: The cavity size was normal. There was mild concentric hypertrophy. Systolic function was normal. The estimated ejection fraction was in the range of 55% to 60%. Images were inadequate for LV wall motion assessment. However, there were no gross abnormalities noted on available images. Doppler parameters are consistent with abnormal left ventricular relaxation (grade 1 diastolic dysfunction). - Left atrium: The atrium was mildly dilated.  Impressions:  - When compared to the report dated 11/17/13, left ventricular systolic function has since normalized.  Disposition and Follow-up:     Discharge Instructions    (HEART FAILURE PATIENTS) Call MD:  Anytime you have any of the following symptoms: 1) 3 pound weight gain in 24 hours or 5 pounds in 1 week 2) shortness of breath, with or without a dry hacking cough 3) swelling in the hands, feet or stomach 4) if you have to sleep on extra pillows at night in order to breathe.    Complete by:  As directed      Diet - low sodium heart healthy    Complete by:  As directed      Increase activity slowly    Complete by:  As directed             DISPOSITION: home    DISCHARGE FOLLOW-UP Follow-up Information    Follow up with TODD,JEFFREY Quintell, MD. Schedule an appointment as soon as possible for a visit in 10 days.   Specialty:  Family Medicine   Why:  for  hospital follow-up, obtain labs FOR RENAL FUNCTION    Contact information:  [place] [address] [phone]       Follow up with COLADONATO,JOSEPH A, MD. Schedule an appointment as soon as possible for a visit in 2 weeks.   Specialty:  Nephrology   Why:  for hospital follow-up   Contact information:   [place] [address] [phone]        Time spent on Discharge: 35 minutes  Signed:   RAI,RIPUDEEP M.D. [facility] 09/23/2014, 10:52 AM Pager: [phone]

## 2014-09-25 LAB — UIFE/LIGHT CHAINS/TP QN, 24-HR UR
% BETA, Urine: 10 %
ALPHA 1 URINE: 4.4 %
Albumin, U: 76 %
Alpha 2, Urine: 2.6 %
Free Kappa/Lambda Ratio: 3.51 (ref 2.04–10.37)
Free Lambda Lt Chains,Ur: 6.86 mg/L — ABNORMAL HIGH (ref 0.24–6.66)
Free Lt Chn Excr Rate: 24.1 mg/L (ref 1.35–24.19)
GAMMA GLOBULIN URINE: 7 %
Total Protein, Urine: 23.6 mg/dL

## 2014-09-25 LAB — PROTEIN ELECTROPHORESIS, SERUM
A/G Ratio: 1 (ref 0.7–1.7)
Albumin ELP: 3.2 g/dL (ref 2.9–4.4)
Alpha-1-Globulin: 0.3 g/dL (ref 0.0–0.4)
Alpha-2-Globulin: 0.7 g/dL (ref 0.4–1.0)
Beta Globulin: 1.2 g/dL (ref 0.7–1.3)
Gamma Globulin: 0.9 g/dL (ref 0.4–1.8)
Globulin, Total: 3.1 g/dL (ref 2.2–3.9)
Total Protein ELP: 6.3 g/dL (ref 6.0–8.5)

## 2014-09-27 LAB — PARATHYROID HORMONE, INTACT (NO CA): PTH: 286 pg/mL — ABNORMAL HIGH (ref 15–65)

## 2014-10-15 ENCOUNTER — Encounter (HOSPITAL_COMMUNITY): Payer: Self-pay | Admitting: Family Medicine

## 2014-10-15 ENCOUNTER — Emergency Department (HOSPITAL_COMMUNITY): Payer: BLUE CROSS/BLUE SHIELD

## 2014-10-15 ENCOUNTER — Emergency Department (HOSPITAL_COMMUNITY)
Admission: EM | Admit: 2014-10-15 | Discharge: 2014-10-15 | Disposition: A | Discharge status: Home / Self Care | Payer: BLUE CROSS/BLUE SHIELD | Attending: Emergency Medicine | Admitting: Emergency Medicine

## 2014-10-15 DIAGNOSIS — R600 Localized edema: Secondary | ICD-10-CM | POA: Insufficient documentation

## 2014-10-15 DIAGNOSIS — Z7982 Long term (current) use of aspirin: Secondary | ICD-10-CM | POA: Diagnosis not present

## 2014-10-15 DIAGNOSIS — Z720 Tobacco use: Secondary | ICD-10-CM | POA: Diagnosis not present

## 2014-10-15 DIAGNOSIS — I509 Heart failure, unspecified: Secondary | ICD-10-CM | POA: Diagnosis not present

## 2014-10-15 DIAGNOSIS — M79605 Pain in left leg: Secondary | ICD-10-CM | POA: Diagnosis present

## 2014-10-15 DIAGNOSIS — Z8719 Personal history of other diseases of the digestive system: Secondary | ICD-10-CM | POA: Insufficient documentation

## 2014-10-15 DIAGNOSIS — M25512 Pain in left shoulder: Secondary | ICD-10-CM | POA: Diagnosis not present

## 2014-10-15 DIAGNOSIS — R140 Abdominal distension (gaseous): Secondary | ICD-10-CM | POA: Diagnosis not present

## 2014-10-15 DIAGNOSIS — M109 Gout, unspecified: Secondary | ICD-10-CM | POA: Insufficient documentation

## 2014-10-15 DIAGNOSIS — I10 Essential (primary) hypertension: Secondary | ICD-10-CM | POA: Insufficient documentation

## 2014-10-15 DIAGNOSIS — Z79899 Other long term (current) drug therapy: Secondary | ICD-10-CM | POA: Diagnosis not present

## 2014-10-15 LAB — BASIC METABOLIC PANEL
Anion gap: 7 (ref 5–15)
BUN: 28 mg/dL — ABNORMAL HIGH (ref 6–20)
CO2: 26 mmol/L (ref 22–32)
Calcium: 9 mg/dL (ref 8.9–10.3)
Chloride: 105 mmol/L (ref 101–111)
Creatinine, Ser: 3.25 mg/dL — ABNORMAL HIGH (ref 0.61–1.24)
GFR calc Af Amer: 23 mL/min — ABNORMAL LOW (ref >60)
GFR calc non Af Amer: 20 mL/min — ABNORMAL LOW (ref >60)
Glucose, Bld: 96 mg/dL (ref 65–99)
Potassium: 4 mmol/L (ref 3.5–5.1)
Sodium: 138 mmol/L (ref 135–145)

## 2014-10-15 LAB — BRAIN NATRIURETIC PEPTIDE: B Natriuretic Peptide: 12.3 pg/mL (ref 0.0–100.0)

## 2014-10-15 LAB — CBC
HCT: 36.7 % — ABNORMAL LOW (ref 39.0–52.0)
Hemoglobin: 11.5 g/dL — ABNORMAL LOW (ref 13.0–17.0)
MCH: 26.5 pg (ref 26.0–34.0)
MCHC: 31.3 g/dL (ref 30.0–36.0)
MCV: 84.6 fL (ref 78.0–100.0)
Platelets: 174 10*3/uL (ref 150–400)
RBC: 4.34 MIL/uL (ref 4.22–5.81)
RDW: 16.8 % — ABNORMAL HIGH (ref 11.5–15.5)
WBC: 7.5 10*3/uL (ref 4.0–10.5)

## 2014-10-15 LAB — I-STAT TROPONIN, ED
Troponin i, poc: 0.02 ng/mL (ref 0.00–0.08)
Troponin i, poc: 0.01 ng/mL (ref 0.00–0.08)

## 2014-10-15 MED ORDER — FUROSEMIDE 20 MG PO TABS
40.0000 mg | ORAL_TABLET | Freq: Once | ORAL | Status: AC
  Administered 2014-10-15: 40 mg via ORAL
  Filled 2014-10-15: qty 2

## 2014-10-15 NOTE — Discharge Instructions (Signed)
Peripheral Edema °You have swelling in your legs (peripheral edema). This swelling is due to excess accumulation of salt and water in your body. Edema may be a sign of heart, kidney or liver disease, or a side effect of a medication. It may also be due to problems in the leg veins. Elevating your legs and using special support stockings may be very helpful, if the cause of the swelling is due to poor venous circulation. Avoid long periods of standing, whatever the cause. °Treatment of edema depends on identifying the cause. Chips, pretzels, pickles and other salty foods should be avoided. Restricting salt in your diet is almost always needed. Water pills (diuretics) are often used to remove the excess salt and water from your body via urine. These medicines prevent the kidney from reabsorbing sodium. This increases urine flow. °Diuretic treatment may also result in lowering of potassium levels in your body. Potassium supplements may be needed if you have to use diuretics daily. Daily weights can help you keep track of your progress in clearing your edema. You should call your caregiver for follow up care as recommended. °SEEK IMMEDIATE MEDICAL CARE IF:  °· You have increased swelling, pain, redness, or heat in your legs. °· You develop shortness of breath, especially when lying down. °· You develop chest or abdominal pain, weakness, or fainting. °· You have a fever. °Document Released: 04/16/2004 Document Revised: 06/01/2011 Document Reviewed: 03/27/2009 °ExitCare® Patient Information ©2015 [employer]. This information is not intended to replace advice given to you by your health care provider. Make sure you discuss any questions you have with your health care provider. ° °

## 2014-10-15 NOTE — ED Notes (Signed)
Pt here for left arm, left leg cramping, abd pain and swelling, SOB, and BLE swelling.

## 2014-10-15 NOTE — ED Provider Notes (Signed)
CSN: [id number]     Arrival date & time 10/15/14  1502 History   First MD Initiated Contact with Patient 10/15/14 1747     Chief Complaint  Patient presents with  . Arm Pain  . Leg Pain  . Abdominal Pain     (Consider location/radiation/quality/duration/timing/severity/associated sxs/prior Treatment) Patient is a 54 y.o. male presenting with arm pain, leg pain, and abdominal pain. The history is provided by the patient.  Arm Pain Pertinent negatives include no chest pain, no abdominal pain, no headaches and no shortness of breath.  Leg Pain Location:  Leg Time since incident:  3 days Injury: no   Leg location:  L leg and R leg Pain details:    Quality:  Aching   Radiates to:  Does not radiate   Severity:  Mild   Onset quality:  Gradual   Duration:  3 days   Timing:  Constant   Progression:  Unchanged Chronicity:  New Dislocation: no   Foreign body present:  No foreign bodies Prior injury to area:  No Relieved by:  Nothing Worsened by:  Activity Ineffective treatments: lasix bid. Associated symptoms: no fever and no neck pain   Abdominal Pain Associated symptoms: no chest pain, no cough, no diarrhea, no dysuria, no fever, no hematuria, no nausea, no shortness of breath and no vomiting     Past Medical History  Diagnosis Date  . Hypertension   . Gout   . 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 CHF (congestive heart failure)    Past Surgical History  Procedure Laterality Date  . Cholecystectomy     Family History  Problem Relation Age of Onset  . Stroke Mother   . Pneumonia Father     died of Pneumonia x3  . CAD Neg Hx    History  Substance Use Topics  . Smoking status: Current Every Day Smoker -- 0.50 packs/day for 30 years    Types: Cigarettes  . Smokeless tobacco: Never Used  . Alcohol Use: No    Review of Systems  Constitutional: Negative for fever.  HENT: Negative for drooling and  rhinorrhea.   Eyes: Negative for pain.  Respiratory: Negative for cough and shortness of breath.   Cardiovascular: Positive for leg swelling. Negative for chest pain.  Gastrointestinal: Negative for nausea, vomiting, abdominal pain and diarrhea.  Genitourinary: Negative for dysuria and hematuria.  Musculoskeletal: Negative for gait problem and neck pain.  Skin: Negative for color change.  Neurological: Negative for numbness and headaches.  Hematological: Negative for adenopathy.  Psychiatric/Behavioral: Negative for behavioral problems.  All other systems reviewed and are negative.     Allergies  Review of patient's allergies indicates no known allergies.  Home Medications   Prior to Admission medications   Medication Sig Start Date End Date Taking? Authorizing Provider  allopurinol (ZYLOPRIM) 300 MG tablet Take 0.5 tablets (150 mg total) by mouth daily. 09/23/14  Yes Ripudeep Krystal Eaton, MD  aspirin 325 MG tablet Take 1 tablet (325 mg total) by mouth daily. 09/23/14  Yes Ripudeep Krystal Eaton, MD  carvedilol (COREG) 12.5 MG tablet Take 1 tablet (12.5 mg total) by mouth 2 (two) times daily with a meal. 09/23/14  Yes Ripudeep K Rai, MD  colchicine 0.6 MG tablet Take 1 tablet (0.6 mg total) by mouth daily. 09/23/14  Yes Ripudeep Krystal Eaton, MD  fenofibrate 54 MG tablet Take 1 tablet (54 mg total)  by mouth daily. 09/23/14  Yes Ripudeep Krystal Eaton, MD  furosemide (LASIX) 20 MG tablet Take 2 tablets (40 mg total) by mouth 2 (two) times daily. 09/23/14  Yes Ripudeep Krystal Eaton, MD  hydrALAZINE (APRESOLINE) 25 MG tablet Take 1.5 tablets (37.5 mg total) by mouth every 8 (eight) hours. 09/23/14  Yes Ripudeep Krystal Eaton, MD  isosorbide mononitrate (IMDUR) 30 MG 24 hr tablet Take 1 tablet (30 mg total) by mouth daily. 09/23/14  Yes Ripudeep Krystal Eaton, MD  oxyCODONE (OXY IR/ROXICODONE) 5 MG immediate release tablet Take 1 tablet (5 mg total) by mouth every 6 (six) hours as needed for moderate pain or severe pain. 09/23/14  Yes Ripudeep Krystal Eaton, MD   potassium chloride SA (K-DUR,KLOR-CON) 20 MEQ tablet Take 1 tablet (20 mEq total) by mouth 2 (two) times daily. 09/23/14  Yes Ripudeep Krystal Eaton, MD  tiZANidine (ZANAFLEX) 4 MG tablet take 1 tablet by mouth twice a day as needed SPASM/PAIN 07/16/14  Yes Historical Provider, MD  predniSONE (DELTASONE) 10 MG tablet Prednisone dosing: Take  Prednisone 40mg  (4 tabs) x 3 days, then taper to 30mg  (3 tabs) x 3 days, then 20mg  (2 tabs) x 3days, then 10mg  (1 tab) x 3days, then OFF. 09/24/14   Ripudeep K Rai, MD   BP 132/70 mmHg  Pulse 84  Temp(Src) 98.9 F (37.2 C) (Oral)  Resp 16  Wt 334 lb 7 oz (151.7 kg)  SpO2 99% Physical Exam  Constitutional: He is oriented to person, place, and time. He appears well-developed and well-nourished.  HENT:  Head: Normocephalic and atraumatic.  Right Ear: External ear normal.  Left Ear: External ear normal.  Nose: Nose normal.  Mouth/Throat: Oropharynx is clear and moist. No oropharyngeal exudate.  Eyes: Conjunctivae and EOM are normal. Pupils are equal, round, and reactive to light.  Neck: Normal range of motion. Neck supple.  Cardiovascular: Normal rate, regular rhythm, normal heart sounds and intact distal pulses.  Exam reveals no gallop and no friction rub.   No murmur heard. Pulmonary/Chest: Effort normal and breath sounds normal. No respiratory distress. He has no wheezes.  Abdominal: Soft. He exhibits distension (mild). There is no tenderness. There is no rebound and no guarding.  Musculoskeletal: Normal range of motion. He exhibits edema (mild to moderate edema and distal lower extremities which extends up to the thighs bilaterally.). He exhibits no tenderness.  Mild reproduction of left shoulder pain with range of motion of the left shoulder which is normal.  Neurological: He is alert and oriented to person, place, and time.  Skin: Skin is warm and dry.  Psychiatric: He has a normal mood and affect. His behavior is normal.  Nursing note and vitals  reviewed.   ED Course  Procedures (including critical care time) Labs Review Labs Reviewed  BASIC METABOLIC PANEL - Abnormal; Notable for the following:    BUN 28 (*)    Creatinine, Ser 3.25 (*)    GFR calc non Af Amer 20 (*)    GFR calc Af Amer 23 (*)    All other components within normal limits  CBC - Abnormal; Notable for the following:    Hemoglobin 11.5 (*)    HCT 36.7 (*)    RDW 16.8 (*)    All other components within normal limits  BRAIN NATRIURETIC PEPTIDE  I-STAT TROPOININ, ED  Randolm Idol, ED    Imaging Review Dg Chest 2 View  10/15/2014   CLINICAL DATA:  Bilateral lower extremity swelling.  Hypertension.  EXAM: CHEST  2 VIEW  COMPARISON:  09/21/2014  FINDINGS: There is mild unchanged left hemidiaphragm elevation. The lungs are clear. Hilar, mediastinal and cardiac contours are unremarkable. Pulmonary vasculature is normal. There is no pleural effusion.  IMPRESSION: No acute cardiopulmonary findings   Electronically Signed   By: Andreas Newport M.D.   On: 10/15/2014 19:15   Dg Abd 1 View  10/15/2014   CLINICAL DATA:  Abdominal distention and pain  EXAM: ABDOMEN - 1 VIEW  COMPARISON:  None.  FINDINGS: There are surgical clips in the right upper quadrant consistent with the history of cholecystectomy. The abdominal gas pattern is normal. No biliary or urinary calculi are evident.  IMPRESSION: Negative.   Electronically Signed   By: Andreas Newport M.D.   On: 10/15/2014 19:22     EKG Interpretation   Date/Time:  Monday October 15 2014 15:05:45 EDT Ventricular Rate:  103 PR Interval:  160 QRS Duration: 88 QT Interval:  352 QTC Calculation: 461 R Axis:   33 Text Interpretation:  Sinus tachycardia with occasional Premature  ventricular complexes and Fusion complexes Otherwise normal ECG non-spec  ST changes in V5-V6 Reconfirmed by Dave Mergen  MD, Aletta Edmunds ([id number]) on  10/15/2014 6:35:42 PM      MDM   Final diagnoses:  Lower extremity edema    6:41 PM 54 y.o.  male w hx of chf, htn on lasix 20mg  bid who presents with worsening lower extremity swelling bilaterally over the last 2-3 days. He notes some mild shortness of breath with exertion which is not significantly changed from baseline. He denies any chest pain. He notes that he has had some mild abdominal distention today but denies any abdominal pain. Last bowel movement was yesterday. He denies any vomiting. He is afebrile and vital signs are unremarkable here. He also states that he has chronic intermittent pain in his left shoulder which is reproducible with range of motion. This is not significantly changed from baseline. Screening lab work thus far noncontributory. Since it is time for delta troponin will go ahead and get this as well as imaging. We'll give a dose of Lasix as well.  Pt w/ recent admission for atypical cp. Was ruled out. EF of 55-60%, When compared to the report dated 11/17/13, left ventricularsystolic function has since normalized per report.   9:00 PM: I interpreted/reviewed the labs and/or imaging which were non-contributory. Will have patient take his Lasix 3 times a day for the next 4-5 days and follow-up with his doctor or return here for any worsening edema after that.  I have discussed the diagnosis/risks/treatment options with the patient and believe the pt to be eligible for discharge home to follow-up with his pcp. We also discussed returning to the ED immediately if new or worsening sx occur. We discussed the sx which are most concerning (e.g., sob, cp, worsening Le edema) that necessitate immediate return. Medications administered to the patient during their visit and any new prescriptions provided to the patient are listed below.  Medications given during this visit Medications  furosemide (LASIX) tablet 40 mg (40 mg Oral Given 10/15/14 1918)    New Prescriptions   No medications on file     Pamella Pert, MD 10/15/14 2101

## 2014-10-30 ENCOUNTER — Emergency Department (HOSPITAL_COMMUNITY)
Admission: EM | Admit: 2014-10-30 | Discharge: 2014-10-31 | Discharge status: Against Medical Advice | Payer: BLUE CROSS/BLUE SHIELD | Attending: Emergency Medicine | Admitting: Emergency Medicine

## 2014-10-30 ENCOUNTER — Emergency Department (HOSPITAL_COMMUNITY): Payer: BLUE CROSS/BLUE SHIELD

## 2014-10-30 ENCOUNTER — Encounter (HOSPITAL_COMMUNITY): Payer: Self-pay | Admitting: Emergency Medicine

## 2014-10-30 DIAGNOSIS — J159 Unspecified bacterial pneumonia: Secondary | ICD-10-CM | POA: Insufficient documentation

## 2014-10-30 DIAGNOSIS — R2243 Localized swelling, mass and lump, lower limb, bilateral: Secondary | ICD-10-CM | POA: Diagnosis not present

## 2014-10-30 DIAGNOSIS — Z7982 Long term (current) use of aspirin: Secondary | ICD-10-CM | POA: Insufficient documentation

## 2014-10-30 DIAGNOSIS — R140 Abdominal distension (gaseous): Secondary | ICD-10-CM | POA: Insufficient documentation

## 2014-10-30 DIAGNOSIS — Z7952 Long term (current) use of systemic steroids: Secondary | ICD-10-CM | POA: Insufficient documentation

## 2014-10-30 DIAGNOSIS — I10 Essential (primary) hypertension: Secondary | ICD-10-CM | POA: Diagnosis not present

## 2014-10-30 DIAGNOSIS — M109 Gout, unspecified: Secondary | ICD-10-CM | POA: Diagnosis not present

## 2014-10-30 DIAGNOSIS — R05 Cough: Secondary | ICD-10-CM | POA: Diagnosis present

## 2014-10-30 DIAGNOSIS — Z79899 Other long term (current) drug therapy: Secondary | ICD-10-CM | POA: Diagnosis not present

## 2014-10-30 DIAGNOSIS — M79603 Pain in arm, unspecified: Secondary | ICD-10-CM | POA: Diagnosis not present

## 2014-10-30 DIAGNOSIS — I509 Heart failure, unspecified: Secondary | ICD-10-CM | POA: Diagnosis not present

## 2014-10-30 DIAGNOSIS — Z8719 Personal history of other diseases of the digestive system: Secondary | ICD-10-CM | POA: Diagnosis not present

## 2014-10-30 DIAGNOSIS — J189 Pneumonia, unspecified organism: Secondary | ICD-10-CM

## 2014-10-30 DIAGNOSIS — R059 Cough, unspecified: Secondary | ICD-10-CM

## 2014-10-30 DIAGNOSIS — Z720 Tobacco use: Secondary | ICD-10-CM | POA: Diagnosis not present

## 2014-10-30 NOTE — ED Provider Notes (Signed)
CSN: [id number]     Arrival date & time 10/30/14  2107 History  This chart was scribed for non-physician practitioner, Debroah Baller, NP working with Milton Ferguson, MD by Tula Nakayama, ED scribe. This patient was seen in room TR02C/TR02C and the patient's care was started at 10:47 PM   Chief Complaint  Patient presents with  . Back Pain  . Nasal Congestion   Patient is a 54 y.o. male presenting with cough and shortness of breath. The history is provided by the patient. No language interpreter was used.  Cough Cough characteristics:  Productive Sputum characteristics:  White Severity:  Mild Onset quality:  Gradual Duration:  1 day Timing:  Intermittent Progression:  Unchanged Chronicity:  New Relieved by:  Nothing Worsened by:  Nothing tried Ineffective treatments:  None tried Associated symptoms: fever and shortness of breath   Associated symptoms: no chest pain and no ear pain   Shortness of Breath Severity:  Mild Onset quality:  Gradual Duration:  1 day Timing:  Constant Progression:  Worsening Relieved by:  Nothing Worsened by:  Activity and coughing Associated symptoms: cough and fever   Associated symptoms: no abdominal pain, no chest pain and no ear pain     HPI Comments: Craig Flynn is a 54 y.o. male with a history of HTN, gout and CHF who presents to the Emergency Department complaining of constant, moderate congestion that started earlier today. Pt states hot flashes, SOB, subjective fever, posterior rib pain and mild productive cough with white sputum as associated symptoms. He has not tried any treatments PTA. Pt was last seen in the ED on 7/25 for abdominal distention, leg swelling and arm pain. He had unremarkable chest and abdominals x-ray at that time. He is currently on Lasix. Pt denies hemoptysis, abdominal pain and sinus pressure as associated symptoms.  Past Medical History  Diagnosis Date  . Hypertension   . Gout   . 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 CHF (congestive heart failure)    Past Surgical History  Procedure Laterality Date  . Cholecystectomy     Family History  Problem Relation Age of Onset  . Stroke Mother   . Pneumonia Father     died of Pneumonia x3  . CAD Neg Hx    History  Substance Use Topics  . Smoking status: Current Every Day Smoker -- 0.00 packs/day for 30 years    Types: Cigarettes  . Smokeless tobacco: Never Used  . Alcohol Use: No    Review of Systems  Constitutional: Positive for fever.  HENT: Positive for congestion. Negative for ear pain and sinus pressure.   Respiratory: Positive for cough and shortness of breath.   Cardiovascular: Negative for chest pain.  Gastrointestinal: Negative for abdominal pain.  Musculoskeletal: Positive for arthralgias.  All other systems reviewed and are negative.     Allergies  Review of patient's allergies indicates no known allergies.  Home Medications   Prior to Admission medications   Medication Sig Start Date End Date Taking? Authorizing Provider  allopurinol (ZYLOPRIM) 300 MG tablet Take 0.5 tablets (150 mg total) by mouth daily. 09/23/14   Ripudeep Krystal Eaton, MD  aspirin 325 MG tablet Take 1 tablet (325 mg total) by mouth daily. 09/23/14   Ripudeep Krystal Eaton, MD  carvedilol (COREG) 12.5 MG tablet Take 1 tablet (12.5 mg total) by mouth 2 (two) times daily with a meal. 09/23/14  Ripudeep Krystal Eaton, MD  colchicine 0.6 MG tablet Take 1 tablet (0.6 mg total) by mouth daily. 09/23/14   Ripudeep Krystal Eaton, MD  fenofibrate 54 MG tablet Take 1 tablet (54 mg total) by mouth daily. 09/23/14   Ripudeep Krystal Eaton, MD  furosemide (LASIX) 20 MG tablet Take 2 tablets (40 mg total) by mouth 2 (two) times daily. 09/23/14   Ripudeep Krystal Eaton, MD  hydrALAZINE (APRESOLINE) 25 MG tablet Take 1.5 tablets (37.5 mg total) by mouth every 8 (eight) hours. 09/23/14   Ripudeep Krystal Eaton, MD  isosorbide mononitrate (IMDUR) 30 MG 24 hr tablet Take 1 tablet (30  mg total) by mouth daily. 09/23/14   Ripudeep Krystal Eaton, MD  oxyCODONE (OXY IR/ROXICODONE) 5 MG immediate release tablet Take 1 tablet (5 mg total) by mouth every 6 (six) hours as needed for moderate pain or severe pain. 09/23/14   Ripudeep Krystal Eaton, MD  potassium chloride SA (K-DUR,KLOR-CON) 20 MEQ tablet Take 1 tablet (20 mEq total) by mouth 2 (two) times daily. 09/23/14   Ripudeep Krystal Eaton, MD  predniSONE (DELTASONE) 10 MG tablet Prednisone dosing: Take  Prednisone 40mg  (4 tabs) x 3 days, then taper to 30mg  (3 tabs) x 3 days, then 20mg  (2 tabs) x 3days, then 10mg  (1 tab) x 3days, then OFF. 09/24/14   Ripudeep Krystal Eaton, MD  tiZANidine (ZANAFLEX) 4 MG tablet take 1 tablet by mouth twice a day as needed SPASM/PAIN 07/16/14   Historical Provider, MD   BP 164/79 mmHg  Pulse 88  Temp(Src) 98.2 F (36.8 C) (Oral)  Resp 16  Ht 6\' 2"  (1.88 m)  Wt 328 lb 6.4 oz (148.961 kg)  BMI 42.15 kg/m2  SpO2 96% Physical Exam  Constitutional: He is oriented to person, place, and time. He appears well-developed and well-nourished. No distress.  HENT:  Head: Normocephalic and atraumatic.  Eyes: Conjunctivae and EOM are normal.  Neck: Neck supple. No tracheal deviation present.  Cardiovascular: Normal rate.   Pulses:      Dorsalis pedis pulses are 2+ on the left side.  Pulmonary/Chest: Effort normal. No respiratory distress. He has decreased breath sounds. He has no wheezes. He has no rales.  Abdominal: Soft. There is no tenderness. There is no CVA tenderness.  Musculoskeletal:  No back pain Left posterior rib pain Swelling bilateral legs, but no pitting edema Left pedal pulse 2+, right 1+  Neurological: He is alert and oriented to person, place, and time.  Skin: Skin is warm and dry.  Psychiatric: He has a normal mood and affect. His behavior is normal.  Nursing note and vitals reviewed.   ED Course  Procedures   DIAGNOSTIC STUDIES: Oxygen Saturation is 96% on RA, normal by my interpretation.    COORDINATION OF  CARE: 11:02 PM Discussed treatment plan with pt which includes a chest x-ray. Pt agreed to plan.   Labs Review Labs Reviewed - No data to display  Imaging Review No results found.   EKG Interpretation None      MDM  54 y.o. male with shortness of breath, cough and left rib pain that started today.  Patient awaiting x-ray results. Care turned over to Franne Forts, PAC  I personally performed the services described in this documentation, which was scribed in my presence. The recorded information has been reviewed and is accurate.    [address] [place], [place] 10/30/14 [id number]  Milton Ferguson, MD 10/31/14 [phone]

## 2014-10-30 NOTE — ED Notes (Signed)
Pt. reports low back pain onset today , denies injury /ambulatory , pt. added sinus/nasal congestion onset yesterday unrelieved by OTC medication .

## 2014-10-31 ENCOUNTER — Emergency Department (HOSPITAL_COMMUNITY): Payer: BLUE CROSS/BLUE SHIELD

## 2014-10-31 LAB — BRAIN NATRIURETIC PEPTIDE: B Natriuretic Peptide: 44.4 pg/mL (ref 0.0–100.0)

## 2014-10-31 MED ORDER — AZITHROMYCIN 250 MG PO TABS: ORAL_TABLET | ORAL | Status: DC

## 2014-10-31 MED ORDER — AZITHROMYCIN 250 MG PO TABS
500.0000 mg | ORAL_TABLET | Freq: Once | ORAL | Status: AC
  Administered 2014-10-31: 500 mg via ORAL
  Filled 2014-10-31: qty 2

## 2014-10-31 NOTE — Discharge Instructions (Signed)
Please follow-up tomorrow morning with your primary care provider for further evaluation and management. Please monitor for new or worsening signs or symptoms, return immediately if any present. Please use medication as directed.

## 2014-10-31 NOTE — ED Notes (Signed)
Pt. Left with all belongings and refused wheelchair. Discharge instructions were reviewed and all questions were answered.  

## 2014-10-31 NOTE — ED Provider Notes (Signed)
  54 year old male signed out to me at shift change by [facility]  nurse practitioner pending chest x-ray. She was seen here in the ED for cough, nasal congestion. Patient was recently hospitalized discharged on 10/15/2014, hospitalization was for atypical chest pain, hypertension, acute kidney injury, acute gout, systolic heart failure, left knee pain.  Patient reports shortness of breath, this is chronic, unchanged from his baseline.  He has a history of heart failure, notes that he has lower extremity edema, reports this is baseline for him as well. Patient denies orthopnea. Chest x-ray showed atelectasis and pulmonary vascular congestion, BNP was normal. Today's presentation could likely represent beginning of pneumonia patient's vital signs are stable and reassuring, he is in no acute distress. Patient initially reported that pain, he has some chronic back pain, but today's pain is over the left posterior rib. This pain is reproduced with palpation.  Due to patient's significant comorbidities, recent hospitalization, CT of the chest was ordered to further differentiate atelectasis. Patient refused CT scan, reporting that he has claustrophobia. I reported that we would be up to give him medications in order for him to receive the scan, he reports that he does not and will not allow [place] to do the scan. I informed him that this was an important aspect to further differentiate chest pathology. He again refused the exam. Patient further refused staying here for further evaluation, he reports that he does not feel his current health is significant enough for hospital admission or further evaluation. I informed him that if this is pneumonia this is a hospital-acquired and did require significant antibiotics. Patient reports that he would like to leave, and that he will follow up tomorrow morning with his primary care provider. I again attempted to have patient stay, I explained the risks of leaving [place], patient was alert, mentating normal, understood the risks including potential death and disability of not staying. Patient verbalized understanding. I discharged him home with azithromycin, he was given a dose here in the ED, again encouraged immediate follow-up evaluation by his primary care.  Patient's case was discussed with Dr.Oni.       Okey Regal, PA-C 10/31/14 0246  Everlene Balls, MD 10/31/14 [phone]

## 2014-11-01 ENCOUNTER — Encounter: Payer: Self-pay | Admitting: Cardiology

## 2014-11-01 ENCOUNTER — Ambulatory Visit (INDEPENDENT_AMBULATORY_CARE_PROVIDER_SITE_OTHER): Payer: BLUE CROSS/BLUE SHIELD | Admitting: Cardiology

## 2014-11-01 VITALS — BP 168/78 | HR 91 | Ht 74.0 in | Wt 322.3 lb

## 2014-11-01 DIAGNOSIS — I5022 Chronic systolic (congestive) heart failure: Secondary | ICD-10-CM | POA: Diagnosis not present

## 2014-11-01 MED ORDER — CARVEDILOL 25 MG PO TABS: 25.0000 mg | ORAL_TABLET | Freq: Two times a day (BID) | ORAL | Status: DC

## 2014-11-01 NOTE — Patient Instructions (Signed)
Your physician recommends that you schedule a follow-up appointment in: 1 Month with APP  Your physician has recommended you make the following change in your medication: Increase Carvedilol 25 mg twice a day

## 2014-11-01 NOTE — Progress Notes (Signed)
Cardiology Office Note   Date:  11/01/2014   ID:  Craig Flynn, DOB [birth year], MRN [id number]  PCP:  Pcp Not In System  Cardiologist:   Minus Breeding, MD   Chief Complaint  Patient presents with  . New Evaluation  . Shortness of Breath    always  . Edema    legs and ankles       History of Present Illness: Craig Flynn is a 54 y.o. male who presents for evaluation of cardiomyopathy and hypertension. The patient has long-standing hypertension. He was seen last year with mildly reduced ejection fraction of about 45%. He did not follow-up with cardiology. He was seen by [place] in consultation when he was admitted with volume overload and hypertension. He was admitted again this year and was in the hospital in July. I reviewed both of these hospitalizations. Of note he had another echocardiogram this year and his EF was said to be about 55%. He wanted to present for follow-up because he is committed to getting healthier. He does get shortness of breath occasionally. He has some upper congestion in his head today. However, he's not describing classic PND or orthopnea. He says his weights have been stable since discharge from the hospital. He reduced his salt. He does drink about 5 bottles of water daily. He does have some chronic lower extremity swelling but this seems to be baseline. She's not reporting any palpitations, presyncope or syncope. He's not describing any chest pressure, neck or arm discomfort.  Past Medical History  Diagnosis Date  . Hypertension   . Gout   . 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 CHF (congestive heart failure)     Past Surgical History  Procedure Laterality Date  . Cholecystectomy       Current Outpatient Prescriptions  Medication Sig Dispense Refill  . allopurinol (ZYLOPRIM) 300 MG tablet Take 0.5 tablets (150 mg total) by mouth daily. 30 tablet 3  . aspirin 325 MG tablet Take 1  tablet (325 mg total) by mouth daily. 30 tablet 3  . carvedilol (COREG) 12.5 MG tablet Take 1 tablet (12.5 mg total) by mouth 2 (two) times daily with a meal. 60 tablet 4  . fenofibrate 54 MG tablet Take 1 tablet (54 mg total) by mouth daily. 30 tablet 3  . furosemide (LASIX) 20 MG tablet Take 2 tablets (40 mg total) by mouth 2 (two) times daily. 120 tablet 3  . hydrALAZINE (APRESOLINE) 25 MG tablet Take 1.5 tablets (37.5 mg total) by mouth every 8 (eight) hours. 90 tablet 3  . isosorbide mononitrate (IMDUR) 30 MG 24 hr tablet Take 1 tablet (30 mg total) by mouth daily. 30 tablet 3  . oxyCODONE (OXY IR/ROXICODONE) 5 MG immediate release tablet Take 1 tablet (5 mg total) by mouth every 6 (six) hours as needed for moderate pain or severe pain. 30 tablet 0  . potassium chloride SA (K-DUR,KLOR-CON) 20 MEQ tablet Take 1 tablet (20 mEq total) by mouth 2 (two) times daily. 60 tablet 3  . tiZANidine (ZANAFLEX) 4 MG tablet prn  0   No current facility-administered medications for this visit.    Allergies:   Review of patient's allergies indicates no known allergies.   ROS:  Please see the history of present illness.   Otherwise, review of systems are positive for none.   All other systems are reviewed and negative.  PHYSICAL EXAM: VS:  BP 168/78 mmHg  Pulse 91  Ht 6\' 2"  (1.88 m)  Wt 322 lb 4.8 oz (146.194 kg)  BMI 41.36 kg/m2 , BMI Body mass index is 41.36 kg/(m^2). GENERAL:  Well appearing HEENT:  Pupils equal round and reactive, fundi not visualized, oral mucosa unremarkable NECK:  No jugular venous distention, waveform within normal limits, carotid upstroke brisk and symmetric, no bruits, no thyromegaly LYMPHATICS:  No cervical, inguinal adenopathy LUNGS:  Clear to auscultation bilaterally BACK:  No CVA tenderness CHEST:  Unremarkable HEART:  PMI not displaced or sustained,S1 and S2 within normal limits, no S3, no S4, no clicks, no rubs, no murmurs ABD:  Flat, positive bowel sounds normal  in frequency in pitch, no bruits, no rebound, no guarding, no midline pulsatile mass, no hepatomegaly, no splenomegaly EXT:  2 plus pulses throughout, mild edema, no cyanosis no clubbing SKIN:  No rashes no nodules NEURO:  Cranial nerves II through XII grossly intact, motor grossly intact throughout PSYCH:  Cognitively intact, oriented to person place and time    EKG:  EKG is not ordered today.    Recent Labs: 11/16/2013: Pro B Natriuretic peptide (BNP) 2888.0* 11/17/2013: TSH 1.530 11/18/2013: ALT 13 11/19/2013: Magnesium 1.7 10/15/2014: BUN 28*; Creatinine, Ser 3.25*; Hemoglobin 11.5*; Platelets 174; Potassium 4.0; Sodium 138 10/31/2014: B Natriuretic Peptide 44.4    Lipid Panel    Component Value Date/Time   CHOL 148 09/22/2014 1400   TRIG 324* 09/22/2014 1400   TRIG 98 03/03/2006 0910   HDL 23* 09/22/2014 1400   CHOLHDL 6.4 09/22/2014 1400   CHOLHDL 5.5 CALC 03/03/2006 0910   VLDL 65* 09/22/2014 1400   LDLCALC 60 09/22/2014 1400      Wt Readings from Last 3 Encounters:  11/01/14 322 lb 4.8 oz (146.194 kg)  10/30/14 328 lb 6.4 oz (148.961 kg)  10/15/14 334 lb 7 oz (151.7 kg)      Other studies Reviewed: Additional studies/ records that were reviewed today include: Hospital records extensive. Review of the above records demonstrates:  Please see elsewhere in the note.     ASSESSMENT AND PLAN:  CARDIOMYOPATHY:  I suspect the patient does have a mildly reduced ejection fraction) I suspect that it is related to long-standing hypertension. At some point I will consider an ischemia workup. However, his ejection fraction was better in 2016 then in 2015. We did discuss salt and fluid restriction. I will titrate his carvedilol. He can have his other meds titrated for future appointments.  HTN:  This is being managed in the context of treating his CHF  OBESITY:  The patient understands the need to lose weight with diet and exercise. We have discussed specific strategies for  this.  CKD:  He is due to follow-up with nephrology.   Current medicines are reviewed at length with the patient today.  The patient does not have concerns regarding medicines.  The following changes have been made:  As above  Labs/ tests ordered today include: None No orders of the defined types were placed in this encounter.     Disposition:   FU with APP in one month.    Signed, Minus Breeding, MD  11/01/2014 3:18 PM    [facility]

## 2014-11-12 ENCOUNTER — Encounter: Payer: Self-pay | Admitting: Gastroenterology

## 2014-11-12 ENCOUNTER — Ambulatory Visit (INDEPENDENT_AMBULATORY_CARE_PROVIDER_SITE_OTHER): Payer: BLUE CROSS/BLUE SHIELD | Admitting: Family

## 2014-11-12 ENCOUNTER — Other Ambulatory Visit (INDEPENDENT_AMBULATORY_CARE_PROVIDER_SITE_OTHER): Payer: BLUE CROSS/BLUE SHIELD

## 2014-11-12 ENCOUNTER — Telehealth: Payer: Self-pay | Admitting: Family

## 2014-11-12 ENCOUNTER — Encounter: Payer: Self-pay | Admitting: Family

## 2014-11-12 VITALS — BP 128/68 | HR 80 | Temp 98.6°F | Resp 18 | Ht 74.0 in | Wt 327.0 lb

## 2014-11-12 DIAGNOSIS — E119 Type 2 diabetes mellitus without complications: Secondary | ICD-10-CM

## 2014-11-12 DIAGNOSIS — N189 Chronic kidney disease, unspecified: Secondary | ICD-10-CM

## 2014-11-12 DIAGNOSIS — R0683 Snoring: Secondary | ICD-10-CM | POA: Diagnosis not present

## 2014-11-12 DIAGNOSIS — Z0001 Encounter for general adult medical examination with abnormal findings: Secondary | ICD-10-CM | POA: Insufficient documentation

## 2014-11-12 DIAGNOSIS — N184 Chronic kidney disease, stage 4 (severe): Secondary | ICD-10-CM

## 2014-11-12 DIAGNOSIS — Z0000 Encounter for general adult medical examination without abnormal findings: Secondary | ICD-10-CM

## 2014-11-12 DIAGNOSIS — D631 Anemia in chronic kidney disease: Secondary | ICD-10-CM

## 2014-11-12 LAB — PSA: PSA: 0.8 ng/mL (ref 0.10–4.00)

## 2014-11-12 NOTE — Assessment & Plan Note (Addendum)
1) Anticipatory Guidance: Discussed importance of wearing a seatbelt while driving and not texting while driving; changing batteries in smoke detector at least once annually; wearing suntan lotion when outside; eating a balanced and moderate diet; getting physical activity at least 30 minutes per day.  2) Immunizations / Screenings / Labs:  All immunizations are up to date per recommendations. Diabetic foot exam completed today. Due for colonoscopy - referral placed. Declined urine microalbumin and Hep C. All other screenings are up to date. Obtain PSA. All other blood work and completed previously.  Overall well exam. Cardiovascular risk factors include hypertension, hyperlipidemia, obesity and diabetes. Blood pressure is well-controlled with current regimen of carvedilol, isosorbide mononitrate, hydralazine, and Lasix. Continue current dosages of the same. Hypertriglyceridemia well-controlled with fenofibrate. Continue current dosage of fenofibrate. Discussed importance of weight loss with a goal of approximately 5-10% of current body weight. Recommend continued activity of 30 minutes daily of moderate intensity physical activity. Emphasized importance of nutrient dense diet. Recommend calorie tracking application for a smart phone to help with managing portion control. Follow-up prevention exam in 1 year. Follow-up office visit for diabetes in 3 months.

## 2014-11-12 NOTE — Patient Instructions (Addendum)
Thank you for choosing [facility].  Summary/Instructions:  Please stop by the lab on the basement level of the building for your blood work. Your results will be released to [place] (or called to you) after review, usually within 72 hours after test completion. If any changes need to be made, you will be notified at that same time.  Referrals have been made during this visit. You should expect to hear back from our schedulers in about 7-10 days in regards to establishing an appointment with the specialists we discussed.   If your symptoms worsen or fail to improve, please contact our office for further instruction, or in case of emergency go directly to the emergency room at the closest medical facility.   Health Maintenance A healthy lifestyle and preventative care can promote health and wellness.  Maintain regular health, dental, and eye exams.  Eat a healthy diet. Foods like vegetables, fruits, whole grains, low-fat dairy products, and lean protein foods contain the nutrients you need and are low in calories. Decrease your intake of foods high in solid fats, added sugars, and salt. Get information about a proper diet from your health care provider, if necessary.  Regular physical exercise is one of the most important things you can do for your health. Most adults should get at least 150 minutes of moderate-intensity exercise (any activity that increases your heart rate and causes you to sweat) each week. In addition, most adults need muscle-strengthening exercises on 2 or more days a week.   Maintain a healthy weight. The body mass index (BMI) is a screening tool to identify possible weight problems. It provides an estimate of body fat based on height and weight. Your health care provider can find your BMI and can help you achieve or maintain a healthy weight. For males 20 years and older:  A BMI below 18.5 is considered underweight.  A BMI of 18.5 to 24.9 is normal.  A BMI of 25  to 29.9 is considered overweight.  A BMI of 30 and above is considered obese.  Maintain normal blood lipids and cholesterol by exercising and minimizing your intake of saturated fat. Eat a balanced diet with plenty of fruits and vegetables. Blood tests for lipids and cholesterol should begin at age 73 and be repeated every 5 years. If your lipid or cholesterol levels are high, you are over age 34, or you are at high risk for heart disease, you may need your cholesterol levels checked more frequently.Ongoing high lipid and cholesterol levels should be treated with medicines if diet and exercise are not working.  If you smoke, find out from your health care provider how to quit. If you do not use tobacco, do not start.  Lung cancer screening is recommended for adults aged 16-80 years who are at high risk for developing lung cancer because of a history of smoking. A yearly low-dose CT scan of the lungs is recommended for people who have at least a 30-pack-year history of smoking and are current smokers or have quit within the past 15 years. A pack year of smoking is smoking an average of 1 pack of cigarettes a day for 1 year (for example, a 30-pack-year history of smoking could mean smoking 1 pack a day for 30 years or 2 packs a day for 15 years). Yearly screening should continue until the smoker has stopped smoking for at least 15 years. Yearly screening should be stopped for people who develop a health problem that would prevent them  from having lung cancer treatment.  If you choose to drink alcohol, do not have more than 2 drinks per day. One drink is considered to be 12 oz (360 mL) of beer, 5 oz (150 mL) of wine, or 1.5 oz (45 mL) of liquor.  Avoid the use of street drugs. Do not share needles with anyone. Ask for help if you need support or instructions about stopping the use of drugs.  High blood pressure causes heart disease and increases the risk of stroke. Blood pressure should be checked at  least every 1-2 years. Ongoing high blood pressure should be treated with medicines if weight loss and exercise are not effective.  If you are 50-58 years old, ask your health care provider if you should take aspirin to prevent heart disease.  Diabetes screening involves taking a blood sample to check your fasting blood sugar level. This should be done once every 3 years after age 31 if you are at a normal weight and without risk factors for diabetes. Testing should be considered at a younger age or be carried out more frequently if you are overweight and have at least 1 risk factor for diabetes.  Colorectal cancer can be detected and often prevented. Most routine colorectal cancer screening begins at the age of 12 and continues through age 23. However, your health care provider may recommend screening at an earlier age if you have risk factors for colon cancer. On a yearly basis, your health care provider may provide home test kits to check for hidden blood in the stool. A small camera at the end of a tube may be used to directly examine the colon (sigmoidoscopy or colonoscopy) to detect the earliest forms of colorectal cancer. Talk to your health care provider about this at age 60 when routine screening begins. A direct exam of the colon should be repeated every 5-10 years through age 43, unless early forms of precancerous polyps or small growths are found.  People who are at an increased risk for hepatitis B should be screened for this virus. You are considered at high risk for hepatitis B if:  You were born in a country where hepatitis B occurs often. Talk with your health care provider about which countries are considered high risk.  Your parents were born in a high-risk country and you have not received a shot to protect against hepatitis B (hepatitis B vaccine).  You have HIV or AIDS.  You use needles to inject street drugs.  You live with, or have sex with, someone who has hepatitis  B.  You are a man who has sex with other men (MSM).  You get hemodialysis treatment.  You take certain medicines for conditions like cancer, organ transplantation, and autoimmune conditions.  Hepatitis C blood testing is recommended for all people born from [birth year] through 1965 and any individual with known risk factors for hepatitis C.  Healthy men should no longer receive prostate-specific antigen (PSA) blood tests as part of routine cancer screening. Talk to your health care provider about prostate cancer screening.  Testicular cancer screening is not recommended for adolescents or adult males who have no symptoms. Screening includes self-exam, a health care provider exam, and other screening tests. Consult with your health care provider about any symptoms you have or any concerns you have about testicular cancer.  Practice safe sex. Use condoms and avoid high-risk sexual practices to reduce the spread of sexually transmitted infections (STIs).  You should be screened for STIs, including  gonorrhea and chlamydia if:  You are sexually active and are younger than 24 years.  You are older than 24 years, and your health care provider tells you that you are at risk for this type of infection.  Your sexual activity has changed since you were last screened, and you are at an increased risk for chlamydia or gonorrhea. Ask your health care provider if you are at risk.  If you are at risk of being infected with HIV, it is recommended that you take a prescription medicine daily to prevent HIV infection. This is called pre-exposure prophylaxis (PrEP). You are considered at risk if:  You are a man who has sex with other men (MSM).  You are a heterosexual man who is sexually active with multiple partners.  You take drugs by injection.  You are sexually active with a partner who has HIV.  Talk with your health care provider about whether you are at high risk of being infected with HIV. If you  choose to begin PrEP, you should first be tested for HIV. You should then be tested every 3 months for as long as you are taking PrEP.  Use sunscreen. Apply sunscreen liberally and repeatedly throughout the day. You should seek shade when your shadow is shorter than you. Protect yourself by wearing long sleeves, pants, a wide-brimmed hat, and sunglasses year round whenever you are outdoors.  Tell your health care provider of new moles or changes in moles, especially if there is a change in shape or color. Also, tell your health care provider if a mole is larger than the size of a pencil eraser.  A one-time screening for abdominal aortic aneurysm (AAA) and surgical repair of large AAAs by ultrasound is recommended for men aged 80-75 years who are current or former smokers.  Stay current with your vaccines (immunizations). Document Released: 09/05/2007 Document Revised: 03/14/2013 Document Reviewed: 08/04/2010 [facility] Patient Information 2015 [place], [place]. This information is not intended to replace advice given to you by your health care provider. Make sure you discuss any questions you have with your health care provider.

## 2014-11-12 NOTE — Assessment & Plan Note (Signed)
Previously diagnosed with type 2 diabetes on a patient adamantly indicates that he does not have type 2 diabetes. Previous blood work reveals an A1c of 7.0 consistent with type 2 diabetes. Appears fairly well controlled with lifestyle management with last A1c at 6.0. Continue current lifestyle management follow-up in 3 months.

## 2014-11-12 NOTE — Telephone Encounter (Signed)
Please inform patient that his PSA measuring inflammation of the prostate is normal. No further action is needed at this time.

## 2014-11-12 NOTE — Progress Notes (Signed)
Subjective:    Patient ID: Craig Flynn, male    DOB: [birth year], 54 y.o.   MRN: [id number]  Chief Complaint  Patient presents with  . Establish Care    CPE, colonoscopy    HPI:  Craig Flynn is a 54 y.o. male who presents today for an annual wellness visit.   1) Health Maintenance -   Diet - Averages about 2-3 meals per day chicken, vegetables, fruit, occasional beef; Denies caffeine intake  Exercise - Walking 30 minutes daily    2) Preventative Exams / Immunizations:  Dental -- Up to date  Vision -- Up to date   Health Maintenance  Topic Date Due  . Hepatitis C Screening  [birth year]  . FOOT EXAM  01/16/1971  . OPHTHALMOLOGY EXAM  01/16/1971  . URINE MICROALBUMIN  01/16/1971  . HIV Screening  01/16/1976  . COLONOSCOPY  01/16/2011  . PNEUMOCOCCAL POLYSACCHARIDE VACCINE (2) 01/21/2013  . HEMOGLOBIN A1C  05/20/2014  . INFLUENZA VACCINE  10/22/2014  . TETANUS/TDAP  09/21/2019    Immunization History  Administered Date(s) Administered  . Influenza Whole 01/22/2008  . Pneumococcal Polysaccharide-23 01/22/2008    Review of Systems  Constitutional: Denies fever, chills, fatigue, or significant weight gain/loss. HENT: Head: Denies headache or neck pain Ears: Denies changes in hearing, ringing in ears, earache, drainage Nose: Denies discharge, stuffiness, itching, nosebleed, sinus pain Throat: Denies sore throat, hoarseness, dry mouth, sores, thrush Eyes: Denies loss/changes in vision, pain, redness, blurry/double vision, flashing lights Cardiovascular: Denies chest pain/discomfort, tightness, palpitations, shortness of breath with activity, difficulty lying down, swelling, sudden awakening with shortness of breath Respiratory: Denies shortness of breath, cough, sputum production, wheezing Positive for snoring with potential apnea Gastrointestinal: Denies dysphasia, heartburn, change in appetite, nausea, change in bowel habits, rectal bleeding, diarrhea, yellow  skin or eyes Constipation Genitourinary: Denies frequency, urgency, burning/pain, blood in urine, incontinence, change in urinary strength. Musculoskeletal: Denies muscle/joint pain (other than below), stiffness, back pain, redness or swelling of joints, trauma Bilateral knee pain Skin: Denies rashes, lumps, itching, dryness, color changes, or hair/nail changes Neurological: Denies dizziness, fainting, seizures, weakness, numbness, tingling, tremor Psychiatric - Denies nervousness, stress, depression or memory loss Endocrine: Denies heat or cold intolerance, sweating, frequent urination, excessive thirst, changes in appetite Hematologic: Denies ease of bruising or bleeding     Objective:    BP 128/68 mmHg  Pulse 80  Temp(Src) 98.6 F (37 C) (Oral)  Resp 18  Ht 6\' 2"  (1.88 m)  Wt 327 lb (148.326 kg)  BMI 41.97 kg/m2  SpO2 98% Nursing note and vital signs reviewed.  Physical Exam  Constitutional: He is oriented to person, place, and time. He appears well-developed and well-nourished. No distress.  HENT:  Head: Normocephalic.  Right Ear: Hearing, tympanic membrane, external ear and ear canal normal.  Left Ear: Hearing, tympanic membrane, external ear and ear canal normal.  Nose: Nose normal.  Mouth/Throat: Uvula is midline, oropharynx is clear and moist and mucous membranes are normal.  Eyes: Conjunctivae and EOM are normal. Pupils are equal, round, and reactive to light.  Neck: Neck supple. No JVD present. No tracheal deviation present. No thyromegaly present.  Cardiovascular: Normal rate, regular rhythm, normal heart sounds and intact distal pulses.   Pulmonary/Chest: Effort normal and breath sounds normal.  Abdominal: Soft. Bowel sounds are normal. He exhibits no distension and no mass. There is no tenderness. There is no rebound and no guarding.  Musculoskeletal: Normal range of motion. He exhibits no edema  or tenderness.  Lymphadenopathy:    He has no cervical adenopathy.    Neurological: He is alert and oriented to person, place, and time. He has normal reflexes. No cranial nerve deficit. He exhibits normal muscle tone. Coordination normal.  Skin: Skin is warm and dry.  Psychiatric: He has a normal mood and affect. His behavior is normal. Judgment and thought content normal.      Assessment & Plan:   Problem List Items Addressed This Visit      Endocrine   Diabetes mellitus    Previously diagnosed with type 2 diabetes on a patient adamantly indicates that he does not have type 2 diabetes. Previous blood work reveals an A1c of 7.0 consistent with type 2 diabetes. Appears fairly well controlled with lifestyle management with last A1c at 6.0. Continue current lifestyle management follow-up in 3 months.        Genitourinary   CKD (chronic kidney disease) stage 4, GFR 15-29 ml/min   Anemia in chronic kidney disease    Previous blood work reveals normocytic anemia most likely related to stage IV chronic kidney disease. Continue to control risk factors including hypertension, diabetes, and hypertriglyceridemia.        Other   Snoring    Noted to have snoring at night with potential for diabetic episodes. Refer to pulmonology for nocturnal polysomnograph and to rule out sleep apnea given her morbid obesity.      Relevant Orders   Ambulatory referral to Pulmonology   Routine general medical examination at a health care facility - Primary    1) Anticipatory Guidance: Discussed importance of wearing a seatbelt while driving and not texting while driving; changing batteries in smoke detector at least once annually; wearing suntan lotion when outside; eating a balanced and moderate diet; getting physical activity at least 30 minutes per day.  2) Immunizations / Screenings / Labs:  All immunizations are up to date per recommendations. Diabetic foot exam completed today. Due for colonoscopy - referral placed. Declined urine microalbumin and Hep C. All other  screenings are up to date. Obtain PSA. All other blood work and completed previously.  Overall well exam. Cardiovascular risk factors include hypertension, hyperlipidemia, obesity and diabetes. Blood pressure is well-controlled with current regimen of carvedilol, isosorbide mononitrate, hydralazine, and Lasix. Continue current dosages of the same. Hypertriglyceridemia well-controlled with fenofibrate. Continue current dosage of fenofibrate. Discussed importance of weight loss with a goal of approximately 5-10% of current body weight. Recommend continued activity of 30 minutes daily of moderate intensity physical activity. Emphasized importance of nutrient dense diet. Recommend calorie tracking application for a smart phone to help with managing portion control. Follow-up prevention exam in 1 year. Follow-up office visit for diabetes in 3 months.      Relevant Orders   PSA (Completed)   Ambulatory referral to Gastroenterology

## 2014-11-12 NOTE — Assessment & Plan Note (Signed)
Previous blood work reveals normocytic anemia most likely related to stage IV chronic kidney disease. Continue to control risk factors including hypertension, diabetes, and hypertriglyceridemia.

## 2014-11-12 NOTE — Assessment & Plan Note (Signed)
Noted to have snoring at night with potential for diabetic episodes. Refer to pulmonology for nocturnal polysomnograph and to rule out sleep apnea given her morbid obesity.

## 2014-11-12 NOTE — Progress Notes (Signed)
Pre visit review using our clinic review tool, if applicable. No additional management support is needed unless otherwise documented below in the visit note. 

## 2014-11-13 NOTE — Telephone Encounter (Signed)
Pt aware of results 

## 2014-11-14 ENCOUNTER — Encounter: Payer: Self-pay | Admitting: Gastroenterology

## 2014-11-27 ENCOUNTER — Encounter: Payer: Self-pay | Admitting: Family

## 2014-11-27 ENCOUNTER — Ambulatory Visit (INDEPENDENT_AMBULATORY_CARE_PROVIDER_SITE_OTHER): Payer: BLUE CROSS/BLUE SHIELD | Admitting: Family

## 2014-11-27 VITALS — BP 132/74 | HR 81 | Temp 97.6°F | Resp 18 | Ht 74.0 in | Wt 324.0 lb

## 2014-11-27 DIAGNOSIS — M25562 Pain in left knee: Secondary | ICD-10-CM

## 2014-11-27 MED ORDER — PREDNISONE 10 MG PO TABS
ORAL_TABLET | ORAL | Status: DC
Start: 2014-11-27 — End: 2014-12-18

## 2014-11-27 NOTE — Progress Notes (Signed)
Subjective:    Patient ID: Craig Flynn, male    DOB: [birth year], 54 y.o.   MRN: [id number]  Chief Complaint  Patient presents with  . Knee Pain    left knee has been bothering him for about 1 week now, states that he does not know what is causing the pain, went to [facility] and they drained it and gave him a cortisone injection state the pain came back a week later    HPI:  Craig Flynn is a 54 y.o. male with a PMH of hypertension, systolic heart failure, diabetes, herniated lumbar disc, stage IV chronic kidney disease, and hyperlipidemia presents today for an acute office visit.  Associated symptom of pain located in his left knee has been going on for approximately 2 weeks. Modifying factors include an orthopedic visit where they drained his knee and provided a cortisone injection indicating about a week of relief. Pain is described as sharp and exacerbated by movement and sitting for long periods of time. Timing of the symptoms are worse in the mornings and becomes functional throughout the day. Denies any trauma or fevers and have attempted colchine which has questionably helped.   No Known Allergies  Current Outpatient Prescriptions on File Prior to Visit  Medication Sig Dispense Refill  . allopurinol (ZYLOPRIM) 300 MG tablet Take 0.5 tablets (150 mg total) by mouth daily. 30 tablet 3  . aspirin 325 MG tablet Take 1 tablet (325 mg total) by mouth daily. 30 tablet 3  . carvedilol (COREG) 25 MG tablet Take 1 tablet (25 mg total) by mouth 2 (two) times daily. 60 tablet 6  . fenofibrate 54 MG tablet Take 1 tablet (54 mg total) by mouth daily. 30 tablet 3  . furosemide (LASIX) 20 MG tablet Take 2 tablets (40 mg total) by mouth 2 (two) times daily. 120 tablet 3  . hydrALAZINE (APRESOLINE) 25 MG tablet Take 1.5 tablets (37.5 mg total) by mouth every 8 (eight) hours. 90 tablet 3  . isosorbide mononitrate (IMDUR) 30 MG 24 hr tablet Take 1 tablet (30 mg total) by mouth daily. 30 tablet 3    . oxyCODONE (OXY IR/ROXICODONE) 5 MG immediate release tablet Take 1 tablet (5 mg total) by mouth every 6 (six) hours as needed for moderate pain or severe pain. 30 tablet 0  . potassium chloride SA (K-DUR,KLOR-CON) 20 MEQ tablet Take 1 tablet (20 mEq total) by mouth 2 (two) times daily. 60 tablet 3  . tiZANidine (ZANAFLEX) 4 MG tablet prn  0   No current facility-administered medications on file prior to visit.    Review of Systems  Constitutional: Negative for fever and chills.  Musculoskeletal:       Positive for left knee pain      Objective:    BP 132/74 mmHg  Pulse 81  Temp(Src) 97.6 F (36.4 C) (Oral)  Resp 18  Ht 6\' 2"  (1.88 m)  Wt 324 lb (146.965 kg)  BMI 41.58 kg/m2  SpO2 97% Nursing note and vital signs reviewed.  Physical Exam  Constitutional: He is oriented to person, place, and time. He appears well-developed and well-nourished. No distress.  Cardiovascular: Normal rate, regular rhythm, normal heart sounds and intact distal pulses.   Pulmonary/Chest: Effort normal and breath sounds normal.  Musculoskeletal:  Left knee: No obvious deformity or discoloration. Moderate edema noted. Slightly increased temperature present. Mild tenderness elicited superior lateral knee around area of vastus lateralis. Range of motion is slightly limited in flexion secondary to  edema. Muscle strength is 4+ out of 5. Ligamentous and meniscal testing is negative. Distal pulses and sensation are intact and appropriate.  Neurological: He is alert and oriented to person, place, and time.  Skin: Skin is warm and dry.  Psychiatric: He has a normal mood and affect. His behavior is normal. Judgment and thought content normal.       Assessment & Plan:   Problem List Items Addressed This Visit      Other   Left knee pain - Primary    Left knee pain most likely associated with osteoarthritis which has been refractory to a cortisone injection and draining by orthopedics. No evidence of  infection. Start prednisone taper. Start home exercise therapy program and ice. Follow up with orthopedics if symptoms worsen or fail to improve.      Relevant Medications   predniSONE (DELTASONE) 10 MG tablet

## 2014-11-27 NOTE — Patient Instructions (Signed)
Thank you for choosing [facility].  Summary/Instructions:  Your prescription(s) have been submitted to your pharmacy or been printed and provided for you. Please take as directed and contact our office if you believe you are having problem(s) with the medication(s) or have any questions.  If your symptoms worsen or fail to improve, please contact our office for further instruction, or in case of emergency go directly to the emergency room at the closest medical facility.   6 Day Prednisone Taper Instructions:   Day 1: Two tablets before breakfast, one after lunch, one after dinner, and two at bedtime.  Day 2: One tablet before breakfast, one after lunch, one after dinner, and two at bedtime Day 3: One tablet before breakfast, one after lunch, one after dinner, and one at bedtime Day 4: One tablet before breakfast, one after lunch, and one at bedtime Day 5: One tablet before breakfast and one at bedtime Day 6: One tablet before breakfast  Knee Exercises EXERCISES RANGE OF MOTION (ROM) AND STRETCHING EXERCISES These exercises may help you when beginning to rehabilitate your injury. Your symptoms may resolve with or without further involvement from your physician, physical therapist, or athletic trainer. While completing these exercises, remember:   Restoring tissue flexibility helps normal motion to return to the joints. This allows healthier, less painful movement and activity.  An effective stretch should be held for at least 30 seconds.  A stretch should never be painful. You should only feel a gentle lengthening or release in the stretched tissue. STRETCH - Knee Extension, Prone  Lie on your stomach on a firm surface, such as a bed or countertop. Place your right / left knee and leg just beyond the edge of the surface. You may wish to place a towel under the far end of your right / left thigh for comfort.  Relax your leg muscles and allow gravity to straighten your knee. Your  clinician may advise you to add an ankle weight if more resistance is helpful for you.  You should feel a stretch in the back of your right / left knee. Hold this position for __________ seconds. Repeat __________ times. Complete this stretch __________ times per day. * Your physician, physical therapist, or athletic trainer may ask you to add ankle weight to enhance your stretch.  RANGE OF MOTION - Knee Flexion, Active  Lie on your back with both knees straight. (If this causes back discomfort, bend your opposite knee, placing your foot flat on the floor.)  Slowly slide your heel back toward your buttocks until you feel a gentle stretch in the front of your knee or thigh.  Hold for __________ seconds. Slowly slide your heel back to the starting position. Repeat __________ times. Complete this exercise __________ times per day.  STRETCH - Quadriceps, Prone   Lie on your stomach on a firm surface, such as a bed or padded floor.  Bend your right / left knee and grasp your ankle. If you are unable to reach your ankle or pant leg, use a belt around your foot to lengthen your reach.  Gently pull your heel toward your buttocks. Your knee should not slide out to the side. You should feel a stretch in the front of your thigh and/or knee.  Hold this position for __________ seconds. Repeat __________ times. Complete this stretch __________ times per day.  STRETCH - Hamstrings, Supine   Lie on your back. Loop a belt or towel over the ball of your right /  left foot.  Straighten your right / left knee and slowly pull on the belt to raise your leg. Do not allow the right / left knee to bend. Keep your opposite leg flat on the floor.  Raise the leg until you feel a gentle stretch behind your right / left knee or thigh. Hold this position for __________ seconds. Repeat __________ times. Complete this stretch __________ times per day.  STRENGTHENING EXERCISES These exercises may help you when  beginning to rehabilitate your injury. They may resolve your symptoms with or without further involvement from your physician, physical therapist, or athletic trainer. While completing these exercises, remember:   Muscles can gain both the endurance and the strength needed for everyday activities through controlled exercises.  Complete these exercises as instructed by your physician, physical therapist, or athletic trainer. Progress the resistance and repetitions only as guided.  You may experience muscle soreness or fatigue, but the pain or discomfort you are trying to eliminate should never worsen during these exercises. If this pain does worsen, stop and make certain you are following the directions exactly. If the pain is still present after adjustments, discontinue the exercise until you can discuss the trouble with your clinician. STRENGTH - Quadriceps, Isometrics  Lie on your back with your right / left leg extended and your opposite knee bent.  Gradually tense the muscles in the front of your right / left thigh. You should see either your knee cap slide up toward your hip or increased dimpling just above the knee. This motion will push the back of the knee down toward the floor/mat/bed on which you are lying.  Hold the muscle as tight as you can without increasing your pain for __________ seconds.  Relax the muscles slowly and completely in between each repetition. Repeat __________ times. Complete this exercise __________ times per day.  STRENGTH - Quadriceps, Short Arcs   Lie on your back. Place a __________ inch towel roll under your knee so that the knee slightly bends.  Raise only your lower leg by tightening the muscles in the front of your thigh. Do not allow your thigh to rise.  Hold this position for __________ seconds. Repeat __________ times. Complete this exercise __________ times per day.  OPTIONAL ANKLE WEIGHTS: Begin with ____________________, but DO NOT exceed  ____________________. Increase in 1 pound/0.5 kilogram increments.  STRENGTH - Quadriceps, Straight Leg Raises  Quality counts! Watch for signs that the quadriceps muscle is working to insure you are strengthening the correct muscles and not "cheating" by substituting with healthier muscles.  Lay on your back with your right / left leg extended and your opposite knee bent.  Tense the muscles in the front of your right / left thigh. You should see either your knee cap slide up or increased dimpling just above the knee. Your thigh may even quiver.  Tighten these muscles even more and raise your leg 4 to 6 inches off the floor. Hold for __________ seconds.  Keeping these muscles tense, lower your leg.  Relax the muscles slowly and completely in between each repetition. Repeat __________ times. Complete this exercise __________ times per day.  STRENGTH - Hamstring, Curls  Lay on your stomach with your legs extended. (If you lay on a bed, your feet may hang over the edge.)  Tighten the muscles in the back of your thigh to bend your right / left knee up to 90 degrees. Keep your hips flat on the bed/floor.  Hold this position for __________  seconds.  Slowly lower your leg back to the starting position. Repeat __________ times. Complete this exercise __________ times per day.  OPTIONAL ANKLE WEIGHTS: Begin with ____________________, but DO NOT exceed ____________________. Increase in 1 pound/0.5 kilogram increments.  STRENGTH - Quadriceps, Squats  Stand in a door frame so that your feet and knees are in line with the frame.  Use your hands for balance, not support, on the frame.  Slowly lower your weight, bending at the hips and knees. Keep your lower legs upright so that they are parallel with the door frame. Squat only within the range that does not increase your knee pain. Never let your hips drop below your knees.  Slowly return upright, pushing with your legs, not pulling with your  hands. Repeat __________ times. Complete this exercise __________ times per day.  STRENGTH - Quadriceps, Wall Slides  Follow guidelines for form closely. Increased knee pain often results from poorly placed feet or knees.  Lean against a smooth wall or door and walk your feet out 18-24 inches. Place your feet hip-width apart.  Slowly slide down the wall or door until your knees bend __________ degrees.* Keep your knees over your heels, not your toes, and in line with your hips, not falling to either side.  Hold for __________ seconds. Stand up to rest for __________ seconds in between each repetition. Repeat __________ times. Complete this exercise __________ times per day. * Your physician, physical therapist, or athletic trainer will alter this angle based on your symptoms and progress. Document Released: 01/21/2005 Document Revised: 07/24/2013 Document Reviewed: 06/21/2008 [facility] Patient Information 2015 [place], [place]. This information is not intended to replace advice given to you by your health care provider. Make sure you discuss any questions you have with your health care provider.

## 2014-11-27 NOTE — Assessment & Plan Note (Addendum)
Left knee pain most likely associated with osteoarthritis which has been refractory to a cortisone injection and draining by orthopedics. No evidence of infection. Start prednisone taper. Start home exercise therapy program and ice. Follow up with orthopedics if symptoms worsen or fail to improve.

## 2014-11-27 NOTE — Progress Notes (Signed)
Pre visit review using our clinic review tool, if applicable. No additional management support is needed unless otherwise documented below in the visit note. 

## 2014-11-28 ENCOUNTER — Ambulatory Visit: Payer: BLUE CROSS/BLUE SHIELD | Admitting: Cardiology

## 2014-12-17 ENCOUNTER — Telehealth: Payer: Self-pay | Admitting: Family

## 2014-12-17 NOTE — Telephone Encounter (Signed)
Pt called to check on this request

## 2014-12-17 NOTE — Telephone Encounter (Signed)
Please have him follow up with orthopedics as the next steps may be to discuss additional injections. He may also try Tumeric which is available over the counter at 1000 mg daily which has been known to help with arthritis. I typically recommend [employer] as they are independently tested.

## 2014-12-17 NOTE — Telephone Encounter (Signed)
Patient is calling to advise that the arthritis has returned in his knee. He is awaiting instruction. Pharmacy is [facility] on [address]

## 2014-12-18 ENCOUNTER — Other Ambulatory Visit: Payer: Self-pay | Admitting: Family

## 2014-12-18 NOTE — Telephone Encounter (Signed)
Patient called back.  I gave him Craig Flynn's response.

## 2014-12-19 ENCOUNTER — Emergency Department (HOSPITAL_COMMUNITY)
Admission: EM | Admit: 2014-12-19 | Discharge: 2014-12-19 | Discharge status: Against Medical Advice | Payer: BLUE CROSS/BLUE SHIELD | Attending: Emergency Medicine | Admitting: Emergency Medicine

## 2014-12-19 ENCOUNTER — Emergency Department (HOSPITAL_COMMUNITY): Payer: BLUE CROSS/BLUE SHIELD

## 2014-12-19 ENCOUNTER — Encounter (HOSPITAL_COMMUNITY): Payer: Self-pay | Admitting: Vascular Surgery

## 2014-12-19 DIAGNOSIS — M25562 Pain in left knee: Secondary | ICD-10-CM | POA: Diagnosis not present

## 2014-12-19 DIAGNOSIS — I509 Heart failure, unspecified: Secondary | ICD-10-CM | POA: Insufficient documentation

## 2014-12-19 DIAGNOSIS — Z79899 Other long term (current) drug therapy: Secondary | ICD-10-CM | POA: Insufficient documentation

## 2014-12-19 DIAGNOSIS — Z7982 Long term (current) use of aspirin: Secondary | ICD-10-CM | POA: Diagnosis not present

## 2014-12-19 DIAGNOSIS — Z720 Tobacco use: Secondary | ICD-10-CM | POA: Insufficient documentation

## 2014-12-19 DIAGNOSIS — Z8719 Personal history of other diseases of the digestive system: Secondary | ICD-10-CM | POA: Insufficient documentation

## 2014-12-19 DIAGNOSIS — I10 Essential (primary) hypertension: Secondary | ICD-10-CM | POA: Insufficient documentation

## 2014-12-19 DIAGNOSIS — M109 Gout, unspecified: Secondary | ICD-10-CM | POA: Insufficient documentation

## 2014-12-19 LAB — DIFFERENTIAL
Basophils Absolute: 0 10*3/uL (ref 0.0–0.1)
Basophils Relative: 0 %
Eosinophils Absolute: 0.3 10*3/uL (ref 0.0–0.7)
Eosinophils Relative: 3 %
Lymphocytes Relative: 28 %
Lymphs Abs: 2.3 10*3/uL (ref 0.7–4.0)
Monocytes Absolute: 0.5 10*3/uL (ref 0.1–1.0)
Monocytes Relative: 6 %
Neutro Abs: 5.1 10*3/uL (ref 1.7–7.7)
Neutrophils Relative %: 63 %

## 2014-12-19 LAB — BASIC METABOLIC PANEL
Anion gap: 10 (ref 5–15)
BUN: 27 mg/dL — ABNORMAL HIGH (ref 6–20)
CO2: 23 mmol/L (ref 22–32)
Calcium: 9.5 mg/dL (ref 8.9–10.3)
Chloride: 107 mmol/L (ref 101–111)
Creatinine, Ser: 2.78 mg/dL — ABNORMAL HIGH (ref 0.61–1.24)
GFR calc Af Amer: 28 mL/min — ABNORMAL LOW (ref >60)
GFR calc non Af Amer: 24 mL/min — ABNORMAL LOW (ref >60)
Glucose, Bld: 97 mg/dL (ref 65–99)
Potassium: 3.8 mmol/L (ref 3.5–5.1)
Sodium: 140 mmol/L (ref 135–145)

## 2014-12-19 LAB — CBC
HCT: 40.5 % (ref 39.0–52.0)
Hemoglobin: 12.4 g/dL — ABNORMAL LOW (ref 13.0–17.0)
MCH: 26 pg (ref 26.0–34.0)
MCHC: 30.6 g/dL (ref 30.0–36.0)
MCV: 84.9 fL (ref 78.0–100.0)
Platelets: 170 10*3/uL (ref 150–400)
RBC: 4.77 MIL/uL (ref 4.22–5.81)
RDW: 17 % — ABNORMAL HIGH (ref 11.5–15.5)
WBC: 8.2 10*3/uL (ref 4.0–10.5)

## 2014-12-19 LAB — SEDIMENTATION RATE: Sed Rate: 22 mm/hr — ABNORMAL HIGH (ref 0–16)

## 2014-12-19 MED ORDER — HYDROCODONE-ACETAMINOPHEN 5-325 MG PO TABS: 1.0000 | ORAL_TABLET | Freq: Four times a day (QID) | ORAL | Status: DC | PRN

## 2014-12-19 MED ORDER — HYDROCODONE-ACETAMINOPHEN 5-325 MG PO TABS
2.0000 | ORAL_TABLET | Freq: Once | ORAL | Status: AC
Start: 2014-12-19 — End: 2014-12-19
  Administered 2014-12-19: 2 via ORAL
  Filled 2014-12-19: qty 2

## 2014-12-19 MED ORDER — LIDOCAINE HCL (PF) 1 % IJ SOLN
5.0000 mL | Freq: Once | INTRAMUSCULAR | Status: AC
  Administered 2014-12-19: 5 mL via INTRADERMAL
  Filled 2014-12-19: qty 5

## 2014-12-19 NOTE — ED Provider Notes (Signed)
CSN: [id number]     Arrival date & time 12/19/14  1044 History  By signing my name below, I, Craig Flynn, attest that this documentation has been prepared under the direction and in the presence of non-physician practitioner, Abigail Butts, PA-C. Electronically Signed: Evelene Flynn, Scribe. 12/19/2014. 4:50 PM.   Chief Complaint  Patient presents with  . Knee Pain   The history is provided by the patient. No language interpreter was used.    HPI Comments:  Craig Flynn is a 54 y.o. male with a history of arthritis and gout, who presents to the Emergency Department complaining of moderate constant left knee pain for 2.5 days. Pt notes he was evaluated for the same pain by his PCP on 9/9 and given prednisone which he took with relief of his pain. He notes his knee pain originally began 3 weeks prior to his 9/9 visit. Pt also notes he has had gout flare ups in his knee before and that his pain today is similar but states his pain today is much worse and when he saw his orthopedist ~ 6 weeks ago he was told it was due to his arthritis. No treatments tried for this episode of pain. He denies fever, chills, dysuria, penile discharge, nausea and vomiting.   Past Medical History  Diagnosis Date  . Hypertension   . Gout   . 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 CHF (congestive heart failure)    Past Surgical History  Procedure Laterality Date  . Cholecystectomy    . Umbilical hernia repair     Family History  Problem Relation Age of Onset  . Stroke Mother   . Pneumonia Father     died of Pneumonia x3  . CAD Neg Hx   . Kidney disease Maternal Grandmother   . Hypertension Maternal Grandmother   . Healthy Maternal Grandfather   . Healthy Paternal Grandmother   . Healthy Paternal Grandfather    Social History  Substance Use Topics  . Smoking status: Current Every Day Smoker -- 0.50 packs/day for 30 years    Types:  Cigarettes  . Smokeless tobacco: Never Used  . Alcohol Use: No    Review of Systems  Constitutional: Negative for fever, chills, diaphoresis, appetite change, fatigue and unexpected weight change.  HENT: Negative for mouth sores.   Eyes: Negative for visual disturbance.  Respiratory: Negative for cough, chest tightness, shortness of breath and wheezing.   Cardiovascular: Negative for chest pain.  Gastrointestinal: Negative for nausea, vomiting, abdominal pain, diarrhea and constipation.  Endocrine: Negative for polydipsia, polyphagia and polyuria.  Genitourinary: Negative for dysuria, urgency, frequency, hematuria and discharge.  Musculoskeletal: Positive for myalgias and arthralgias. Negative for back pain and neck stiffness.  Skin: Negative for rash.  Allergic/Immunologic: Negative for immunocompromised state.  Neurological: Negative for syncope, light-headedness and headaches.  Hematological: Does not bruise/bleed easily.  Psychiatric/Behavioral: Negative for sleep disturbance. The patient is not nervous/anxious.     Allergies  Review of patient's allergies indicates no known allergies.  Home Medications   Prior to Admission medications   Medication Sig Start Date End Date Taking? Authorizing Provider  allopurinol (ZYLOPRIM) 300 MG tablet Take 0.5 tablets (150 mg total) by mouth daily. 09/23/14   Ripudeep Krystal Eaton, MD  aspirin 325 MG tablet Take 1 tablet (325 mg total) by mouth daily. 09/23/14   Ripudeep Krystal Eaton, MD  carvedilol (COREG) 25 MG tablet  Take 1 tablet (25 mg total) by mouth 2 (two) times daily. 11/01/14   Minus Breeding, MD  fenofibrate 54 MG tablet Take 1 tablet (54 mg total) by mouth daily. 09/23/14   Ripudeep Krystal Eaton, MD  furosemide (LASIX) 20 MG tablet Take 2 tablets (40 mg total) by mouth 2 (two) times daily. 09/23/14   Ripudeep Krystal Eaton, MD  hydrALAZINE (APRESOLINE) 25 MG tablet Take 1.5 tablets (37.5 mg total) by mouth every 8 (eight) hours. 09/23/14   Ripudeep Krystal Eaton, MD   HYDROcodone-acetaminophen (NORCO/VICODIN) 5-325 MG tablet Take 1-2 tablets by mouth every 6 (six) hours as needed for moderate pain or severe pain. 12/19/14   Francile Woolford, PA-C  isosorbide mononitrate (IMDUR) 30 MG 24 hr tablet Take 1 tablet (30 mg total) by mouth daily. 09/23/14   Ripudeep Krystal Eaton, MD  oxyCODONE (OXY IR/ROXICODONE) 5 MG immediate release tablet Take 1 tablet (5 mg total) by mouth every 6 (six) hours as needed for moderate pain or severe pain. 09/23/14   Ripudeep Krystal Eaton, MD  potassium chloride SA (K-DUR,KLOR-CON) 20 MEQ tablet Take 1 tablet (20 mEq total) by mouth 2 (two) times daily. 09/23/14   Ripudeep Krystal Eaton, MD  predniSONE (STERAPRED UNI-PAK 21 TAB) 10 MG (21) TBPK tablet use as directed ON BACK OF PACKAGE 12/18/14   Golden Circle, FNP  tiZANidine (ZANAFLEX) 4 MG tablet prn 07/16/14   Historical Provider, MD   BP 120/90 mmHg  Pulse 80  Temp(Src) 98.9 F (37.2 C) (Oral)  Resp 16  Ht 6\' 2"  (1.88 m)  Wt 326 lb (147.873 kg)  BMI 41.84 kg/m2  SpO2 97% Physical Exam  Constitutional: He appears well-developed and well-nourished. No distress.  HENT:  Head: Normocephalic and atraumatic.  Eyes: Conjunctivae are normal.  Neck: Normal range of motion.  Cardiovascular: Normal rate, regular rhythm and intact distal pulses.   Capillary refill < 3 sec  Pulmonary/Chest: Effort normal and breath sounds normal.  Musculoskeletal: He exhibits edema and tenderness.  minimal ROM of the left knee with obvious effusion, redness and increased warmth  No induration of the skin TTP along bilatateral joint lines and popiteal fossa  1+ pitting edema baseline per pt  Neurological: He is alert. Coordination normal.  Sensation intact Strength unable to test due to severe pain  Skin: Skin is warm and dry. He is not diaphoretic.  No tenting of the skin No overlying cellulitis  Psychiatric: He has a normal mood and affect.  Nursing note and vitals reviewed.   ED Course   ARTHOCENTESIS Date/Time: 12/19/2014 4:18 PM Performed by: Abigail Butts Authorized by: Abigail Butts Consent: Verbal consent obtained. Risks and benefits: risks, benefits and alternatives were discussed Consent given by: patient Patient understanding: patient states understanding of the procedure being performed Patient consent: the patient's understanding of the procedure matches consent given Procedure consent: procedure consent matches procedure scheduled Relevant documents: relevant documents present and verified Site marked: the operative site was marked Imaging studies: imaging studies available Required items: required blood products, implants, devices, and special equipment available Patient identity confirmed: verbally with patient Time out: Immediately prior to procedure a "time out" was called to verify the correct patient, procedure, equipment, support staff and site/side marked as required. Indications: joint swelling,  pain and possible septic joint  Body area: knee Local anesthesia used: yes Anesthesia: local infiltration Local anesthetic: lidocaine 1% without epinephrine Anesthetic total: 5 ml Preparation: Patient was prepped and draped in the usual sterile fashion. Ultrasound guidance: no  Approach: lateral Aspirate: blood-tinged Aspirate amount (ml): <29ml. Comments: Pt with difficulty holding still during procedure due to pain, thus limiting the success of the tap.  Not enough fluid was obtained to send for analysis or culture.       DIAGNOSTIC STUDIES:  Oxygen Saturation is 100% on RA, normal by my interpretation.    COORDINATION OF CARE:  1:16 PM Discussed treatment plan with pt at bedside and pt agreed to plan.    Labs Review Labs Reviewed  BASIC METABOLIC PANEL - Abnormal; Notable for the following:    BUN 27 (*)    Creatinine, Ser 2.78 (*)    GFR calc non Af Amer 24 (*)    GFR calc Af Amer 28 (*)    All other components within  normal limits  SEDIMENTATION RATE - Abnormal; Notable for the following:    Sed Rate 22 (*)    All other components within normal limits  CBC - Abnormal; Notable for the following:    Hemoglobin 12.4 (*)    RDW 17.0 (*)    All other components within normal limits  BODY FLUID CULTURE  DIFFERENTIAL  CBC WITH DIFFERENTIAL/PLATELET  SYNOVIAL CELL COUNT + DIFF, W/ CRYSTALS    Imaging Review Dg Knee Complete 4 Views Left  12/19/2014   CLINICAL DATA:  Left knee pain  EXAM: LEFT KNEE - COMPLETE 4+ VIEW  COMPARISON:  09/22/2014  FINDINGS: The left knee demonstrates no acute fracture or dislocation. There is a trace joint effusion. There is generalized osteopenia. There is moderate -severe medial femorotibial compartment osteoarthritis.  IMPRESSION: Moderate -severe medial femorotibial compartment osteoarthritis.   Electronically Signed   By: Kathreen Devoid   On: 12/19/2014 14:19   I have personally reviewed and evaluated these images and lab results as part of my medical decision-making.   EKG Interpretation None      MDM   Final diagnoses:  Arthralgia of left knee   [name] presents with left knee pain, swelling, increased warmth and decreased ROM.  No evidence of cellulitis of the skin.  Pt will need labwork, imaging and aspiration of joint fluid.  Concern for septic joint vs gout vs severe arthritis.  Pt evaluated by Dr. Rex Kras who agrees.    2:20PM X-ray without evidence of moderate-severe medial femorotibial compartment osteoarthritis.  3:55PM Arthrocentesis with only minimal fluid aspiration of bloody fluid.  There was not enough to send for testing.  Discussed with Dr. Rex Kras.  When evaluated by Dr. Rex Kras for further arthrocentesis, pt refused any further treatment.   4:13 PM I have discussed my concerns as his provider along with Dr. Rex Kras and the possibility that this may worsen. I have specifically discussed that without further evaluation I cannot guarantee there is  not a life threatening event occuring.  I have significant concern for possible septic joint.  Time was given to allow the opportunity to ask questions and consider their options, and after the discussion, the patient decided to refuse the offerred treatment. The patient was informed that refusal could lead to, but was not limited to, death, permanent disability, or severe pain. Prior to refusing, I determined that the patient had the capacity to make their decision and understood the consequences of that decision. After refusal, I made every reasonable opportunity to treat them to the best of my ability.  The patient was notified that they may return to the emergency department at any time for further treatment.  Pt will be discharged with  pain control.  He reports he will see his orthopedist tomorrow.   4:37 PM I discussed his pending CBC results with the lab who reports the tube was clotted.  Another is being redrawn at this time.  He does not appear toxic.  He had a low grade fever on arrival, but has remained without tachycardia.  He continues to refuse further joint aspiration.    BP 120/90 mmHg  Pulse 80  Temp(Src) 98.9 F (37.2 C) (Oral)  Resp 16  Ht 6\' 2"  (1.88 m)  Wt 326 lb (147.873 kg)  BMI 41.84 kg/m2  SpO2 97%  I personally performed the services described in this documentation, which was scribed in my presence. The recorded information has been reviewed and is accurate.   Abigail Butts, PA-C 12/19/14 [place], MD 12/19/14 1655

## 2014-12-19 NOTE — ED Notes (Signed)
Pt reports to the ED for eval of left knee pain. Pt has hx of arthritis in that knee and states that this feels like an exacerbation of his arthritis. Denies any injury to the knee. Was on Prednisone 2 weeks ago and states it got better but in the last 3 days it has been getting worse tried to f/u with ortho but was out of town. Pt A&Ox4, resp e/u, and skin warm and dry.

## 2014-12-19 NOTE — ED Notes (Signed)
Attempted to draw blood, unable to access, phlebotomy called.

## 2014-12-19 NOTE — ED Notes (Signed)
Pt refuses to have knee tap and decides to leave AMA.

## 2014-12-19 NOTE — Discharge Instructions (Signed)
1. Medications: vicodin, usual home medications 2. Treatment: rest, drink plenty of fluids,  3. Follow Up: Please see your orthopedist TOMORROW morning.  If you are unable to see your orthopedist please return to the emergency department for further treatment.

## 2014-12-19 NOTE — ED Notes (Signed)
Pt is in stable condition upon d/c and is escorted from ED via wheelchair. 

## 2014-12-19 NOTE — ED Notes (Signed)
CBC redrawn and sent to lab. Lab called and made aware.

## 2014-12-27 ENCOUNTER — Ambulatory Visit (INDEPENDENT_AMBULATORY_CARE_PROVIDER_SITE_OTHER): Payer: BLUE CROSS/BLUE SHIELD | Admitting: Pulmonary Disease

## 2014-12-27 ENCOUNTER — Encounter: Payer: Self-pay | Admitting: Pulmonary Disease

## 2014-12-27 ENCOUNTER — Encounter (INDEPENDENT_AMBULATORY_CARE_PROVIDER_SITE_OTHER): Payer: Self-pay

## 2014-12-27 VITALS — BP 142/78 | HR 89 | Ht 74.0 in | Wt 329.9 lb

## 2014-12-27 DIAGNOSIS — G4733 Obstructive sleep apnea (adult) (pediatric): Secondary | ICD-10-CM | POA: Diagnosis not present

## 2014-12-27 NOTE — Assessment & Plan Note (Addendum)
Given excessive daytime somnolence, narrow pharyngeal exam, witnessed apneas & loud snoring, obstructive sleep apnea is very likely & an overnight polysomnogram will be scheduled as a split study. The pathophysiology of obstructive sleep apnea , it's cardiovascular consequences & modes of treatment including CPAP were discused with the patient in detail & they evidenced understanding.  Given cardiac and renal comorbidities, it would be best to have an attended study

## 2014-12-27 NOTE — Progress Notes (Signed)
Subjective:    Patient ID: Craig Flynn, male    DOB: [birth year], 54 y.o.   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 Other family members have also noted that he stops breathing in his sleep. He reports excessive daytime somnolence but denies problems driving. Epworth sleepiness score is 5 but I suspect that he is underreporting He reports napping on various occasions while he is waiting in the For a customer. Bedtime is between 9 and 11 PM, sleep latency is about an hour, he sleeps on his side with one pillow, reports 2-3 nocturnal awakenings and is out of bed at 3:30 AM feeling okay, without dryness of mouth or headache. He is gained 50 pounds over the last 2 years. He had a sleep study done in 1992 and was told this was normal. He has a brother and a cousin with OSA OR on CPAP therapy. 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 He also has chronic kidney disease, last creatinine 2.8 in 11/2014  CXR 10/2014 showed pulmonary vascular congestion  There is no history suggestive of cataplexy, sleep paralysis or parasomnias    Past Medical History  Diagnosis Date  . Hypertension   . Gout   . 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 CHF (congestive heart failure) [facility])    Past Surgical History  Procedure Laterality Date  . Cholecystectomy    . Umbilical hernia repair      No Known Allergies  Social History   Social History  . Marital Status: Legally Separated    Spouse Name: N/A  . Number of Children: 2  . Years of Education: 12   Occupational History  . Disability     Knees    Social History Main Topics  . Smoking status: Current Every Day Smoker -- 0.50 packs/day for 30 years    Types: Cigarettes   . Smokeless tobacco: Never Used  . Alcohol Use: No  . Drug Use: No  . Sexual Activity: Not on file   Other Topics Concern  . Not on file   Social History Narrative   Fun: Golf, basketball    Denies religious beliefs effecting health care.     Family History  Problem Relation Age of Onset  . Stroke Mother   . Pneumonia Father     died of Pneumonia x3  . CAD Neg Hx   . Kidney disease Maternal Grandmother   . Hypertension Maternal Grandmother   . Healthy Maternal Grandfather   . Healthy Paternal Grandmother   . Healthy Paternal Grandfather      Review of Systems  Constitutional: Positive for unexpected weight change.  Respiratory: Positive for cough and shortness of breath.   Cardiovascular: Positive for palpitations.  Musculoskeletal: Positive for joint swelling.       Objective:   Physical Exam  Gen. Pleasant, obese, in no distress, normal affect ENT - no lesions, no post nasal drip, class 2-3 airway Neck: No JVD, no thyromegaly, no carotid bruits Lungs: no use of accessory muscles, no dullness to percussion, decreased without rales or rhonchi  Cardiovascular: Rhythm regular, heart sounds  normal, no murmurs or gallops, no peripheral edema Abdomen: soft and non-tender, no hepatosplenomegaly, BS normal. Musculoskeletal: No deformities, no  cyanosis or clubbing Neuro:  alert, non focal, no tremors       Assessment & Plan:

## 2014-12-27 NOTE — Patient Instructions (Signed)
You may have obstructive sleep apnea Schedule sleep study

## 2015-01-15 ENCOUNTER — Encounter: Payer: Self-pay | Admitting: Family

## 2015-01-15 ENCOUNTER — Ambulatory Visit (INDEPENDENT_AMBULATORY_CARE_PROVIDER_SITE_OTHER): Payer: BLUE CROSS/BLUE SHIELD | Admitting: Family

## 2015-01-15 VITALS — BP 108/60 | HR 88 | Temp 98.3°F | Resp 18 | Ht 74.0 in | Wt 329.8 lb

## 2015-01-15 DIAGNOSIS — M62838 Other muscle spasm: Secondary | ICD-10-CM

## 2015-01-15 DIAGNOSIS — M6248 Contracture of muscle, other site: Secondary | ICD-10-CM | POA: Diagnosis not present

## 2015-01-15 MED ORDER — TIZANIDINE HCL 4 MG PO TABS: 4.0000 mg | ORAL_TABLET | Freq: Three times a day (TID) | ORAL | Status: DC | PRN

## 2015-01-15 NOTE — Progress Notes (Signed)
Subjective:    Patient ID: Craig Flynn, male    DOB: [birth year], 54 y.o.   MRN: [id number]  Chief Complaint  Patient presents with  . Neck Pain    x3 days having pain in the right side of his neck, described as a constant pain especially when he moves    HPI:  Craig Flynn is a 54 y.o. male who  has a past medical history of Hypertension; Gout; Other specified disorder of intestines; and CHF (congestive heart failure) ([place]). and presents today for an acute office visit.  This is a new problem. Associated symptoms of pain located on the right side of his neck hemming going on for approximately 3 days described as constant, sharp and aggravated when moving. Modifying factors include Tylenol sinus which did not help very much. Denies any known trauma or sounds/sensations heard or felt. Not able to look to the left. Denies numbness and tingling in his extremities.  No Known Allergies   Current Outpatient Prescriptions on File Prior to Visit  Medication Sig Dispense Refill  . allopurinol (ZYLOPRIM) 300 MG tablet Take 0.5 tablets (150 mg total) by mouth daily. 30 tablet 3  . aspirin 325 MG tablet Take 1 tablet (325 mg total) by mouth daily. 30 tablet 3  . carvedilol (COREG) 25 MG tablet Take 1 tablet (25 mg total) by mouth 2 (two) times daily. 60 tablet 6  . fenofibrate 54 MG tablet Take 1 tablet (54 mg total) by mouth daily. 30 tablet 3  . furosemide (LASIX) 20 MG tablet Take 2 tablets (40 mg total) by mouth 2 (two) times daily. 120 tablet 3  . hydrALAZINE (APRESOLINE) 25 MG tablet Take 1.5 tablets (37.5 mg total) by mouth every 8 (eight) hours. 90 tablet 3  . HYDROcodone-acetaminophen (NORCO/VICODIN) 5-325 MG tablet Take 1-2 tablets by mouth every 6 (six) hours as needed for moderate pain or severe pain. 7 tablet 0  . isosorbide mononitrate (IMDUR) 30 MG 24 hr tablet Take 1 tablet (30 mg total) by mouth daily. 30 tablet 3  . oxyCODONE (OXY IR/ROXICODONE) 5 MG immediate release tablet  Take 1 tablet (5 mg total) by mouth every 6 (six) hours as needed for moderate pain or severe pain. 30 tablet 0  . potassium chloride SA (K-DUR,KLOR-CON) 20 MEQ tablet Take 1 tablet (20 mEq total) by mouth 2 (two) times daily. 60 tablet 3  . predniSONE (STERAPRED UNI-PAK 21 TAB) 10 MG (21) TBPK tablet use as directed ON BACK OF PACKAGE 21 tablet 0   No current facility-administered medications on file prior to visit.     Review of Systems  Constitutional: Negative for fever and chills.  Musculoskeletal: Positive for neck pain and neck stiffness.  Neurological: Negative for weakness and numbness.      Objective:    BP 108/60 mmHg  Pulse 88  Temp(Src) 98.3 F (36.8 C) (Oral)  Resp 18  Ht 6\' 2"  (1.88 m)  Wt 329 lb 12.8 oz (149.596 kg)  BMI 42.33 kg/m2  SpO2 97% Nursing note and vital signs reviewed.  Physical Exam  Constitutional: He is oriented to person, place, and time. He appears well-developed and well-nourished. No distress.  Neck:  Neck with no obvious deformity, discoloration, or edema noted. Mild tenderness and spasm noted of upper trapezius greater on the right side than on the left. Range of motion is limited in lateral bending and rotation. Distal pulses and sensation are intact and appropriate. Negative compression test.  Cardiovascular: Normal  rate, regular rhythm, normal heart sounds and intact distal pulses.   Pulmonary/Chest: Effort normal and breath sounds normal.  Neurological: He is alert and oriented to person, place, and time.  Skin: Skin is warm and dry.  Psychiatric: He has a normal mood and affect. His behavior is normal. Judgment and thought content normal.       Assessment & Plan:   Problem List Items Addressed This Visit      Musculoskeletal and Integument   Neck muscle spasm - Primary    Symptoms and exam consistent with neck muscle spasm undetermined origin. Refill Zanaflex. Treat conservatively with heat and home exercise therapy including  stretching. Continue over-the-counter medications and ointments as needed for symptom relief and supportive care. Follow-up if symptoms worsen or fail to improve.      Relevant Medications   tiZANidine (ZANAFLEX) 4 MG tablet

## 2015-01-15 NOTE — Patient Instructions (Signed)
Thank you for choosing [facility].  Summary/Instructions:  If your symptoms worsen or fail to improve, please contact our office for further instruction, or in case of emergency go directly to the emergency room at the closest medical facility.   Heat 2-3 times per day and skin creams as needed for discomfort.   Cervical Strain and Sprain With Rehab Cervical strain and sprain are injuries that commonly occur with "whiplash" injuries. Whiplash occurs when the neck is forcefully whipped backward or forward, such as during a motor vehicle accident or during contact sports. The muscles, ligaments, tendons, discs, and nerves of the neck are susceptible to injury when this occurs. RISK FACTORS Risk of having a whiplash injury increases if:  Osteoarthritis of the spine.  Situations that make head or neck accidents or trauma more likely.  High-risk sports (football, rugby, wrestling, hockey, auto racing, gymnastics, diving, contact karate, or boxing).  Poor strength and flexibility of the neck.  Previous neck injury.  Poor tackling technique.  Improperly fitted or padded equipment. SYMPTOMS   Pain or stiffness in the front or back of neck or both.  Symptoms may present immediately or up to 24 hours after injury.  Dizziness, headache, nausea, and vomiting.  Muscle spasm with soreness and stiffness in the neck.  Tenderness and swelling at the injury site. PREVENTION  Learn and use proper technique (avoid tackling with the head, spearing, and head-butting; use proper falling techniques to avoid landing on the head).  Warm up and stretch properly before activity.  Maintain physical fitness:  Strength, flexibility, and endurance.  Cardiovascular fitness.  Wear properly fitted and padded protective equipment, such as padded soft collars, for participation in contact sports. PROGNOSIS  Recovery from cervical strain and sprain injuries is dependent on the extent of the  injury. These injuries are usually curable in 1 week to 3 months with appropriate treatment.  RELATED COMPLICATIONS   Temporary numbness and weakness may occur if the nerve roots are damaged, and this may persist until the nerve has completely healed.  Chronic pain due to frequent recurrence of symptoms.  Prolonged healing, especially if activity is resumed too soon (before complete recovery). TREATMENT  Treatment initially involves the use of ice and medication to help reduce pain and inflammation. It is also important to perform strengthening and stretching exercises and modify activities that worsen symptoms so the injury does not get worse. These exercises may be performed at home or with a therapist. For patients who experience severe symptoms, a soft, padded collar may be recommended to be worn around the neck.  Improving your posture may help reduce symptoms. Posture improvement includes pulling your chin and abdomen in while sitting or standing. If you are sitting, sit in a firm chair with your buttocks against the back of the chair. While sleeping, try replacing your pillow with a small towel rolled to 2 inches in diameter, or use a cervical pillow or soft cervical collar. Poor sleeping positions delay healing.  For patients with nerve root damage, which causes numbness or weakness, the use of a cervical traction apparatus may be recommended. Surgery is rarely necessary for these injuries. However, cervical strain and sprains that are present at birth (congenital) may require surgery. MEDICATION   If pain medication is necessary, nonsteroidal anti-inflammatory medications, such as aspirin and ibuprofen, or other minor pain relievers, such as acetaminophen, are often recommended.  Do not take pain medication for 7 days before surgery.  Prescription pain relievers may be given if  deemed necessary by your caregiver. Use only as directed and only as much as you need. HEAT AND COLD:   Cold  treatment (icing) relieves pain and reduces inflammation. Cold treatment should be applied for 10 to 15 minutes every 2 to 3 hours for inflammation and pain and immediately after any activity that aggravates your symptoms. Use ice packs or an ice massage.  Heat treatment may be used prior to performing the stretching and strengthening activities prescribed by your caregiver, physical therapist, or athletic trainer. Use a heat pack or a warm soak. SEEK MEDICAL CARE IF:   Symptoms get worse or do not improve in 2 weeks despite treatment.  New, unexplained symptoms develop (drugs used in treatment may produce side effects). EXERCISES RANGE OF MOTION (ROM) AND STRETCHING EXERCISES - Cervical Strain and Sprain These exercises may help you when beginning to rehabilitate your injury. In order to successfully resolve your symptoms, you must improve your posture. These exercises are designed to help reduce the forward-head and rounded-shoulder posture which contributes to this condition. Your symptoms may resolve with or without further involvement from your physician, physical therapist or athletic trainer. While completing these exercises, remember:   Restoring tissue flexibility helps normal motion to return to the joints. This allows healthier, less painful movement and activity.  An effective stretch should be held for at least 20 seconds, although you may need to begin with shorter hold times for comfort.  A stretch should never be painful. You should only feel a gentle lengthening or release in the stretched tissue. STRETCH- Axial Extensors  Lie on your back on the floor. You may bend your knees for comfort. Place a rolled-up hand towel or dish towel, about 2 inches in diameter, under the part of your head that makes contact with the floor.  Gently tuck your chin, as if trying to make a "double chin," until you feel a gentle stretch at the base of your head.  Hold __________ seconds. Repeat  __________ times. Complete this exercise __________ times per day.  STRETCH - Axial Extension   Stand or sit on a firm surface. Assume a good posture: chest up, shoulders drawn back, abdominal muscles slightly tense, knees unlocked (if standing) and feet hip width apart.  Slowly retract your chin so your head slides back and your chin slightly lowers. Continue to look straight ahead.  You should feel a gentle stretch in the back of your head. Be certain not to feel an aggressive stretch since this can cause headaches later.  Hold for __________ seconds. Repeat __________ times. Complete this exercise __________ times per day. STRETCH - Cervical Side Bend   Stand or sit on a firm surface. Assume a good posture: chest up, shoulders drawn back, abdominal muscles slightly tense, knees unlocked (if standing) and feet hip width apart.  Without letting your nose or shoulders move, slowly tip your right / left ear to your shoulder until your feel a gentle stretch in the muscles on the opposite side of your neck.  Hold __________ seconds. Repeat __________ times. Complete this exercise __________ times per day. STRETCH - Cervical Rotators   Stand or sit on a firm surface. Assume a good posture: chest up, shoulders drawn back, abdominal muscles slightly tense, knees unlocked (if standing) and feet hip width apart.  Keeping your eyes level with the ground, slowly turn your head until you feel a gentle stretch along the back and opposite side of your neck.  Hold __________ seconds. Repeat __________  times. Complete this exercise __________ times per day. RANGE OF MOTION - Neck Circles   Stand or sit on a firm surface. Assume a good posture: chest up, shoulders drawn back, abdominal muscles slightly tense, knees unlocked (if standing) and feet hip width apart.  Gently roll your head down and around from the back of one shoulder to the back of the other. The motion should never be forced or  painful.  Repeat the motion 10-20 times, or until you feel the neck muscles relax and loosen. Repeat __________ times. Complete the exercise __________ times per day. STRENGTHENING EXERCISES - Cervical Strain and Sprain These exercises may help you when beginning to rehabilitate your injury. They may resolve your symptoms with or without further involvement from your physician, physical therapist, or athletic trainer. While completing these exercises, remember:   Muscles can gain both the endurance and the strength needed for everyday activities through controlled exercises.  Complete these exercises as instructed by your physician, physical therapist, or athletic trainer. Progress the resistance and repetitions only as guided.  You may experience muscle soreness or fatigue, but the pain or discomfort you are trying to eliminate should never worsen during these exercises. If this pain does worsen, stop and make certain you are following the directions exactly. If the pain is still present after adjustments, discontinue the exercise until you can discuss the trouble with your clinician. STRENGTH - Cervical Flexors, Isometric  Face a wall, standing about 6 inches away. Place a small pillow, a ball about 6-8 inches in diameter, or a folded towel between your forehead and the wall.  Slightly tuck your chin and gently push your forehead into the soft object. Push only with mild to moderate intensity, building up tension gradually. Keep your jaw and forehead relaxed.  Hold 10 to 20 seconds. Keep your breathing relaxed.  Release the tension slowly. Relax your neck muscles completely before you start the next repetition. Repeat __________ times. Complete this exercise __________ times per day. STRENGTH- Cervical Lateral Flexors, Isometric   Stand about 6 inches away from a wall. Place a small pillow, a ball about 6-8 inches in diameter, or a folded towel between the side of your head and the  wall.  Slightly tuck your chin and gently tilt your head into the soft object. Push only with mild to moderate intensity, building up tension gradually. Keep your jaw and forehead relaxed.  Hold 10 to 20 seconds. Keep your breathing relaxed.  Release the tension slowly. Relax your neck muscles completely before you start the next repetition. Repeat __________ times. Complete this exercise __________ times per day. STRENGTH - Cervical Extensors, Isometric   Stand about 6 inches away from a wall. Place a small pillow, a ball about 6-8 inches in diameter, or a folded towel between the back of your head and the wall.  Slightly tuck your chin and gently tilt your head back into the soft object. Push only with mild to moderate intensity, building up tension gradually. Keep your jaw and forehead relaxed.  Hold 10 to 20 seconds. Keep your breathing relaxed.  Release the tension slowly. Relax your neck muscles completely before you start the next repetition. Repeat __________ times. Complete this exercise __________ times per day. POSTURE AND BODY MECHANICS CONSIDERATIONS - Cervical Strain and Sprain Keeping correct posture when sitting, standing or completing your activities will reduce the stress put on different body tissues, allowing injured tissues a chance to heal and limiting painful experiences. The following are general  guidelines for improved posture. Your physician or physical therapist will provide you with any instructions specific to your needs. While reading these guidelines, remember:  The exercises prescribed by your provider will help you have the flexibility and strength to maintain correct postures.  The correct posture provides the optimal environment for your joints to work. All of your joints have less wear and tear when properly supported by a spine with good posture. This means you will experience a healthier, less painful body.  Correct posture must be practiced with all of  your activities, especially prolonged sitting and standing. Correct posture is as important when doing repetitive low-stress activities (typing) as it is when doing a single heavy-load activity (lifting). PROLONGED STANDING WHILE SLIGHTLY LEANING FORWARD When completing a task that requires you to lean forward while standing in one place for a long time, place either foot up on a stationary 2- to 4-inch high object to help maintain the best posture. When both feet are on the ground, the low back tends to lose its slight inward curve. If this curve flattens (or becomes too large), then the back and your other joints will experience too much stress, fatigue more quickly, and can cause pain.  RESTING POSITIONS Consider which positions are most painful for you when choosing a resting position. If you have pain with flexion-based activities (sitting, bending, stooping, squatting), choose a position that allows you to rest in a less flexed posture. You would want to avoid curling into a fetal position on your side. If your pain worsens with extension-based activities (prolonged standing, working overhead), avoid resting in an extended position such as sleeping on your stomach. Most people will find more comfort when they rest with their spine in a more neutral position, neither too rounded nor too arched. Lying on a non-sagging bed on your side with a pillow between your knees, or on your back with a pillow under your knees will often provide some relief. Keep in mind, being in any one position for a prolonged period of time, no matter how correct your posture, can still lead to stiffness. WALKING Walk with an upright posture. Your ears, shoulders, and hips should all line up. OFFICE WORK When working at a desk, create an environment that supports good, upright posture. Without extra support, muscles fatigue and lead to excessive strain on joints and other tissues. CHAIR:  A chair should be able to slide under  your desk when your back makes contact with the back of the chair. This allows you to work closely.  The chair's height should allow your eyes to be level with the upper part of your monitor and your hands to be slightly lower than your elbows.  Body position:  Your feet should make contact with the floor. If this is not possible, use a foot rest.  Keep your ears over your shoulders. This will reduce stress on your neck and low back.   This information is not intended to replace advice given to you by your health care provider. Make sure you discuss any questions you have with your health care provider.   Document Released: 03/09/2005 Document Revised: 03/30/2014 Document Reviewed: 06/21/2008 Elsevier Interactive Patient Education [employer].

## 2015-01-15 NOTE — Assessment & Plan Note (Addendum)
Symptoms and exam consistent with neck muscle spasm undetermined origin. Refill Zanaflex. Treat conservatively with heat and home exercise therapy including stretching. Continue over-the-counter medications and ointments as needed for symptom relief and supportive care. Follow-up if symptoms worsen or fail to improve.

## 2015-01-15 NOTE — Progress Notes (Signed)
Pre visit review using our clinic review tool, if applicable. No additional management support is needed unless otherwise documented below in the visit note. 

## 2015-01-18 ENCOUNTER — Encounter: Payer: BLUE CROSS/BLUE SHIELD | Admitting: Gastroenterology

## 2015-01-24 ENCOUNTER — Encounter (HOSPITAL_BASED_OUTPATIENT_CLINIC_OR_DEPARTMENT_OTHER): Payer: BLUE CROSS/BLUE SHIELD

## 2015-01-24 ENCOUNTER — Ambulatory Visit (HOSPITAL_BASED_OUTPATIENT_CLINIC_OR_DEPARTMENT_OTHER): Payer: BLUE CROSS/BLUE SHIELD

## 2015-02-20 ENCOUNTER — Encounter: Payer: BLUE CROSS/BLUE SHIELD | Admitting: Gastroenterology

## 2015-02-25 ENCOUNTER — Emergency Department (HOSPITAL_COMMUNITY): Payer: Commercial Managed Care - HMO

## 2015-02-25 ENCOUNTER — Encounter (HOSPITAL_COMMUNITY): Payer: Self-pay | Admitting: *Deleted

## 2015-02-25 ENCOUNTER — Observation Stay (HOSPITAL_COMMUNITY)
Admission: EM | Admit: 2015-02-25 | Discharge: 2015-02-28 | Disposition: A | Discharge status: Home / Self Care | Payer: Commercial Managed Care - HMO | Attending: Internal Medicine | Admitting: Internal Medicine

## 2015-02-25 DIAGNOSIS — E1122 Type 2 diabetes mellitus with diabetic chronic kidney disease: Secondary | ICD-10-CM | POA: Diagnosis not present

## 2015-02-25 DIAGNOSIS — J209 Acute bronchitis, unspecified: Secondary | ICD-10-CM | POA: Diagnosis present

## 2015-02-25 DIAGNOSIS — I5032 Chronic diastolic (congestive) heart failure: Secondary | ICD-10-CM | POA: Diagnosis not present

## 2015-02-25 DIAGNOSIS — G4733 Obstructive sleep apnea (adult) (pediatric): Secondary | ICD-10-CM | POA: Diagnosis present

## 2015-02-25 DIAGNOSIS — R05 Cough: Secondary | ICD-10-CM | POA: Diagnosis not present

## 2015-02-25 DIAGNOSIS — J439 Emphysema, unspecified: Secondary | ICD-10-CM | POA: Insufficient documentation

## 2015-02-25 DIAGNOSIS — M109 Gout, unspecified: Secondary | ICD-10-CM | POA: Insufficient documentation

## 2015-02-25 DIAGNOSIS — Z7982 Long term (current) use of aspirin: Secondary | ICD-10-CM | POA: Insufficient documentation

## 2015-02-25 DIAGNOSIS — N179 Acute kidney failure, unspecified: Secondary | ICD-10-CM | POA: Diagnosis not present

## 2015-02-25 DIAGNOSIS — R0789 Other chest pain: Secondary | ICD-10-CM | POA: Diagnosis not present

## 2015-02-25 DIAGNOSIS — M7989 Other specified soft tissue disorders: Secondary | ICD-10-CM

## 2015-02-25 DIAGNOSIS — I2510 Atherosclerotic heart disease of native coronary artery without angina pectoris: Secondary | ICD-10-CM | POA: Diagnosis not present

## 2015-02-25 DIAGNOSIS — E785 Hyperlipidemia, unspecified: Secondary | ICD-10-CM | POA: Diagnosis not present

## 2015-02-25 DIAGNOSIS — N189 Chronic kidney disease, unspecified: Secondary | ICD-10-CM | POA: Diagnosis not present

## 2015-02-25 DIAGNOSIS — N183 Chronic kidney disease, stage 3 (moderate): Secondary | ICD-10-CM | POA: Insufficient documentation

## 2015-02-25 DIAGNOSIS — I5189 Other ill-defined heart diseases: Secondary | ICD-10-CM

## 2015-02-25 DIAGNOSIS — T797XXA Traumatic subcutaneous emphysema, initial encounter: Secondary | ICD-10-CM

## 2015-02-25 DIAGNOSIS — Z720 Tobacco use: Secondary | ICD-10-CM | POA: Diagnosis present

## 2015-02-25 DIAGNOSIS — I129 Hypertensive chronic kidney disease with stage 1 through stage 4 chronic kidney disease, or unspecified chronic kidney disease: Secondary | ICD-10-CM | POA: Insufficient documentation

## 2015-02-25 DIAGNOSIS — I10 Essential (primary) hypertension: Secondary | ICD-10-CM | POA: Diagnosis present

## 2015-02-25 DIAGNOSIS — Z87891 Personal history of nicotine dependence: Secondary | ICD-10-CM | POA: Insufficient documentation

## 2015-02-25 DIAGNOSIS — R0602 Shortness of breath: Secondary | ICD-10-CM

## 2015-02-25 DIAGNOSIS — F17210 Nicotine dependence, cigarettes, uncomplicated: Secondary | ICD-10-CM | POA: Diagnosis not present

## 2015-02-25 LAB — CBC
HCT: 43.2 % (ref 39.0–52.0)
Hemoglobin: 13.2 g/dL (ref 13.0–17.0)
MCH: 26.5 pg (ref 26.0–34.0)
MCHC: 30.6 g/dL (ref 30.0–36.0)
MCV: 86.6 fL (ref 78.0–100.0)
Platelets: 209 10*3/uL (ref 150–400)
RBC: 4.99 MIL/uL (ref 4.22–5.81)
RDW: 16.2 % — ABNORMAL HIGH (ref 11.5–15.5)
WBC: 7.1 10*3/uL (ref 4.0–10.5)

## 2015-02-25 LAB — BASIC METABOLIC PANEL
Anion gap: 7 (ref 5–15)
BUN: 36 mg/dL — ABNORMAL HIGH (ref 6–20)
CO2: 30 mmol/L (ref 22–32)
Calcium: 9.5 mg/dL (ref 8.9–10.3)
Chloride: 102 mmol/L (ref 101–111)
Creatinine, Ser: 3.56 mg/dL — ABNORMAL HIGH (ref 0.61–1.24)
GFR calc Af Amer: 21 mL/min — ABNORMAL LOW (ref >60)
GFR calc non Af Amer: 18 mL/min — ABNORMAL LOW (ref >60)
Glucose, Bld: 115 mg/dL — ABNORMAL HIGH (ref 65–99)
Potassium: 3.7 mmol/L (ref 3.5–5.1)
Sodium: 139 mmol/L (ref 135–145)

## 2015-02-25 LAB — I-STAT TROPONIN, ED: Troponin i, poc: 0.03 ng/mL (ref 0.00–0.08)

## 2015-02-25 LAB — BRAIN NATRIURETIC PEPTIDE: B Natriuretic Peptide: 22 pg/mL (ref 0.0–100.0)

## 2015-02-25 MED ORDER — ASPIRIN 325 MG PO TABS
325.0000 mg | ORAL_TABLET | Freq: Every day | ORAL | Status: DC
  Administered 2015-02-26 – 2015-02-28 (×3): 325 mg via ORAL
  Filled 2015-02-25 (×3): qty 1

## 2015-02-25 MED ORDER — HEPARIN SODIUM (PORCINE) 5000 UNIT/ML IJ SOLN
5000.0000 [IU] | Freq: Three times a day (TID) | INTRAMUSCULAR | Status: DC
  Administered 2015-02-26 – 2015-02-28 (×8): 5000 [IU] via SUBCUTANEOUS
  Filled 2015-02-25 (×8): qty 1

## 2015-02-25 MED ORDER — ALBUTEROL SULFATE (2.5 MG/3ML) 0.083% IN NEBU: 2.5000 mg | INHALATION_SOLUTION | Freq: Four times a day (QID) | RESPIRATORY_TRACT | Status: DC

## 2015-02-25 MED ORDER — CARVEDILOL 25 MG PO TABS
25.0000 mg | ORAL_TABLET | Freq: Two times a day (BID) | ORAL | Status: DC
  Administered 2015-02-26 – 2015-02-28 (×5): 25 mg via ORAL
  Filled 2015-02-25 (×5): qty 1

## 2015-02-25 MED ORDER — ONDANSETRON HCL 4 MG PO TABS: 4.0000 mg | ORAL_TABLET | Freq: Four times a day (QID) | ORAL | Status: DC | PRN

## 2015-02-25 MED ORDER — ALBUTEROL SULFATE (2.5 MG/3ML) 0.083% IN NEBU
2.5000 mg | INHALATION_SOLUTION | RESPIRATORY_TRACT | Status: DC | PRN
  Administered 2015-02-27 – 2015-02-28 (×2): 2.5 mg via RESPIRATORY_TRACT
  Filled 2015-02-25 (×2): qty 3

## 2015-02-25 MED ORDER — IPRATROPIUM BROMIDE 0.02 % IN SOLN: 0.5000 mg | Freq: Four times a day (QID) | RESPIRATORY_TRACT | Status: DC

## 2015-02-25 MED ORDER — ONDANSETRON HCL 4 MG/2ML IJ SOLN: 4.0000 mg | Freq: Four times a day (QID) | INTRAMUSCULAR | Status: DC | PRN

## 2015-02-25 MED ORDER — IPRATROPIUM-ALBUTEROL 0.5-2.5 (3) MG/3ML IN SOLN
3.0000 mL | Freq: Once | RESPIRATORY_TRACT | Status: AC
Start: 2015-02-25 — End: 2015-02-25
  Administered 2015-02-25: 3 mL via RESPIRATORY_TRACT
  Filled 2015-02-25: qty 3

## 2015-02-25 MED ORDER — FENOFIBRATE 54 MG PO TABS
54.0000 mg | ORAL_TABLET | Freq: Every day | ORAL | Status: DC
  Administered 2015-02-26 – 2015-02-28 (×3): 54 mg via ORAL
  Filled 2015-02-25 (×4): qty 1

## 2015-02-25 MED ORDER — ISOSORBIDE MONONITRATE ER 30 MG PO TB24
30.0000 mg | ORAL_TABLET | Freq: Every day | ORAL | Status: DC
  Administered 2015-02-26 – 2015-02-28 (×3): 30 mg via ORAL
  Filled 2015-02-25 (×3): qty 1

## 2015-02-25 MED ORDER — HYDRALAZINE HCL 25 MG PO TABS
37.5000 mg | ORAL_TABLET | Freq: Three times a day (TID) | ORAL | Status: DC
  Administered 2015-02-26 – 2015-02-28 (×8): 37.5 mg via ORAL
  Filled 2015-02-25 (×8): qty 2

## 2015-02-25 MED ORDER — GUAIFENESIN ER 600 MG PO TB12
600.0000 mg | ORAL_TABLET | Freq: Two times a day (BID) | ORAL | Status: DC
  Administered 2015-02-26: 600 mg via ORAL
  Filled 2015-02-25: qty 1

## 2015-02-25 MED ORDER — ACETAMINOPHEN 325 MG PO TABS
650.0000 mg | ORAL_TABLET | Freq: Four times a day (QID) | ORAL | Status: DC | PRN
  Administered 2015-02-27: 650 mg via ORAL
  Filled 2015-02-25: qty 2

## 2015-02-25 MED ORDER — POTASSIUM CHLORIDE CRYS ER 20 MEQ PO TBCR
20.0000 meq | EXTENDED_RELEASE_TABLET | Freq: Two times a day (BID) | ORAL | Status: DC
  Administered 2015-02-26 – 2015-02-28 (×6): 20 meq via ORAL
  Filled 2015-02-25 (×6): qty 1

## 2015-02-25 MED ORDER — ALLOPURINOL 300 MG PO TABS
150.0000 mg | ORAL_TABLET | Freq: Every day | ORAL | Status: DC
  Administered 2015-02-26 – 2015-02-28 (×3): 150 mg via ORAL
  Filled 2015-02-25 (×3): qty 1

## 2015-02-25 MED ORDER — ACETAMINOPHEN 650 MG RE SUPP: 650.0000 mg | Freq: Four times a day (QID) | RECTAL | Status: DC | PRN

## 2015-02-25 MED ORDER — SODIUM CHLORIDE 0.9 % IJ SOLN
3.0000 mL | Freq: Two times a day (BID) | INTRAMUSCULAR | Status: DC
  Administered 2015-02-26 – 2015-02-28 (×5): 3 mL via INTRAVENOUS

## 2015-02-25 MED ORDER — HYDROCODONE-ACETAMINOPHEN 5-325 MG PO TABS: 1.0000 | ORAL_TABLET | ORAL | Status: DC | PRN

## 2015-02-25 NOTE — ED Notes (Signed)
Pt reports mid sharp chest pains today. Having a productive cough with white sputum. Having sore throat and swelling to throat x 1.5 week. Also has increase in swelling to legs, hx of chf.

## 2015-02-25 NOTE — ED Notes (Signed)
MD at bedside. 

## 2015-02-25 NOTE — ED Provider Notes (Signed)
CSN: [id number]     Arrival date & time 02/25/15  1631 History   First MD Initiated Contact with Patient 02/25/15 2046     Chief Complaint  Patient presents with  . Chest Pain     (Consider location/radiation/quality/duration/timing/severity/associated sxs/prior Treatment) HPI  54 year old male presents with chest pain and cough that started this morning. She has been a sharp and worsens with coughing. Patient is coughing up white sputum. Patient has been having chronic shortness of breath but is worse today. Chronic leg swelling has also been worse. He does have a history of CHF and is on Lasix. Patient denies a history of chronic kidney disease but his labs indicate otherwise. Patient also is complaining of bilateral shoulder swelling and neck swelling that has been ongoing for 2 weeks. This does not typically occur with CHF. Chest pain is about a 6/10 currently.  Past Medical History  Diagnosis Date  . Hypertension   . Gout   . 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 CHF (congestive heart failure) [facility])    Past Surgical History  Procedure Laterality Date  . Cholecystectomy    . Umbilical hernia repair     Family History  Problem Relation Age of Onset  . Stroke Mother   . Pneumonia Father     died of Pneumonia x3  . CAD Neg Hx   . Kidney disease Maternal Grandmother   . Hypertension Maternal Grandmother   . Healthy Maternal Grandfather   . Healthy Paternal Grandmother   . Healthy Paternal Grandfather    Social History  Substance Use Topics  . Smoking status: Current Every Day Smoker -- 0.50 packs/day for 30 years    Types: Cigarettes  . Smokeless tobacco: Never Used  . Alcohol Use: No    Review of Systems  Constitutional: Negative for fever.  Respiratory: Positive for cough and shortness of breath.   Cardiovascular: Positive for chest pain and leg swelling.  All other systems reviewed and are  negative.     Allergies  Review of patient's allergies indicates no known allergies.  Home Medications   Prior to Admission medications   Medication Sig Start Date End Date Taking? Authorizing Provider  allopurinol (ZYLOPRIM) 300 MG tablet Take 0.5 tablets (150 mg total) by mouth daily. 09/23/14  Yes Ripudeep Krystal Eaton, MD  aspirin 325 MG tablet Take 1 tablet (325 mg total) by mouth daily. 09/23/14  Yes Ripudeep Krystal Eaton, MD  carvedilol (COREG) 25 MG tablet Take 1 tablet (25 mg total) by mouth 2 (two) times daily. 11/01/14  Yes Minus Breeding, MD  fenofibrate 54 MG tablet Take 1 tablet (54 mg total) by mouth daily. 09/23/14  Yes Ripudeep Krystal Eaton, MD  furosemide (LASIX) 20 MG tablet Take 2 tablets (40 mg total) by mouth 2 (two) times daily. 09/23/14  Yes Ripudeep Krystal Eaton, MD  hydrALAZINE (APRESOLINE) 25 MG tablet Take 1.5 tablets (37.5 mg total) by mouth every 8 (eight) hours. 09/23/14  Yes Ripudeep Krystal Eaton, MD  isosorbide mononitrate (IMDUR) 30 MG 24 hr tablet Take 1 tablet (30 mg total) by mouth daily. 09/23/14  Yes Ripudeep Krystal Eaton, MD  potassium chloride SA (K-DUR,KLOR-CON) 20 MEQ tablet Take 1 tablet (20 mEq total) by mouth 2 (two) times daily. 09/23/14  Yes Ripudeep Krystal Eaton, MD  predniSONE (STERAPRED UNI-PAK 21 TAB) 10 MG (21) TBPK tablet use as directed ON BACK OF PACKAGE 12/18/14  Yes Golden Circle, FNP  HYDROcodone-acetaminophen (NORCO/VICODIN) 5-325 MG tablet Take 1-2 tablets by mouth every 6 (six) hours as needed for moderate pain or severe pain. 12/19/14   Hannah Muthersbaugh, PA-C  oxyCODONE (OXY IR/ROXICODONE) 5 MG immediate release tablet Take 1 tablet (5 mg total) by mouth every 6 (six) hours as needed for moderate pain or severe pain. 09/23/14   Ripudeep Krystal Eaton, MD  tiZANidine (ZANAFLEX) 4 MG tablet Take 1 tablet (4 mg total) by mouth every 8 (eight) hours as needed. 01/15/15   Golden Circle, FNP   BP 127/63 mmHg  Pulse 98  Temp(Src) 100.7 F (38.2 C) (Oral)  Resp 20  Ht 6\' 2"  (1.88 m)  Wt 333 lb 3.2  oz (151.139 kg)  BMI 42.76 kg/m2  SpO2 98% Physical Exam  Constitutional: He is oriented to person, place, and time. He appears well-developed and well-nourished.  Morbidly obese  HENT:  Head: Normocephalic and atraumatic.  Right Ear: External ear normal.  Left Ear: External ear normal.  Nose: Nose normal.  Eyes: Right eye exhibits no discharge. Left eye exhibits no discharge.  Neck: Neck supple.  Nonpitting swelling to bilateral lower neck/trapezius  Cardiovascular: Normal rate, regular rhythm, normal heart sounds and intact distal pulses.   Pulmonary/Chest: Effort normal. He has wheezes (mild expiratory wheezes diffusely).  Abdominal: Soft. There is no tenderness.  Musculoskeletal: He exhibits edema (bilateral lower extremity pitting edema).  Neurological: He is alert and oriented to person, place, and time.  Skin: Skin is warm and dry.  Nursing note and vitals reviewed.   ED Course  Procedures (including critical care time) Labs Review Labs Reviewed  BASIC METABOLIC PANEL - Abnormal; Notable for the following:    Glucose, Bld 115 (*)    BUN 36 (*)    Creatinine, Ser 3.56 (*)    GFR calc non Af Amer 18 (*)    GFR calc Af Amer 21 (*)    All other components within normal limits  CBC - Abnormal; Notable for the following:    RDW 16.2 (*)    All other components within normal limits  BRAIN NATRIURETIC PEPTIDE  I-STAT TROPOININ, ED    Imaging Review Dg Chest 2 View  02/25/2015  CLINICAL DATA:  Initial encounter for mid sharp chest pain starting today with productive cough and white sputum. EXAM: CHEST  2 VIEW COMPARISON:  10/30/2014. FINDINGS: The lungs are clear wiithout focal pneumonia, edema, pneumothorax or pleural effusion. The cardiopericardial silhouette is within normal limits for size. Cardiopericardial silhouette is at upper limits of normal for size. Imaged bony structures of the thorax are intact. IMPRESSION: Stable.  No acute cardiopulmonary findings.  Electronically Signed   By: Misty Stanley M.D.   On: 02/25/2015 17:07   I have personally reviewed and evaluated these images and lab results as part of my medical decision-making.   EKG Interpretation   Date/Time:  Monday February 25 2015 16:36:02 EST Ventricular Rate:  92 PR Interval:  166 QRS Duration: 88 QT Interval:  370 QTC Calculation: 457 R Axis:   60 Text Interpretation:  Normal sinus rhythm T wave abnormality, consider  inferior ischemia Abnormal ECG T wave changes more prominent comapred to  July 2016 Confirmed by Regenia Skeeter  MD, [place] [id number]) on 02/25/2015 8:47:35 PM      MDM   Final diagnoses:  Atypical chest pain  Acute on chronic kidney failure (HCC)    Patient's wheezing and shortness of breath significantly improved after one albuterol  treatment. I believe his chest pain is from coughing and a musculoskeletal issue. Doubt ACS although he does have some nonspecific T waves. Initial troponin negative. It is concerning that he has acute on chronic renal failure. Is the point that I think he needs to be admitted to prevent further damage. Admit to the hospitalist.    Sherwood Gambler, MD 02/25/15 [phone]

## 2015-02-26 ENCOUNTER — Ambulatory Visit: Payer: BLUE CROSS/BLUE SHIELD | Admitting: Family

## 2015-02-26 ENCOUNTER — Observation Stay (HOSPITAL_COMMUNITY): Payer: Commercial Managed Care - HMO

## 2015-02-26 ENCOUNTER — Encounter (HOSPITAL_COMMUNITY): Payer: Self-pay | Admitting: General Practice

## 2015-02-26 DIAGNOSIS — I519 Heart disease, unspecified: Secondary | ICD-10-CM | POA: Diagnosis not present

## 2015-02-26 DIAGNOSIS — I10 Essential (primary) hypertension: Secondary | ICD-10-CM

## 2015-02-26 DIAGNOSIS — J209 Acute bronchitis, unspecified: Secondary | ICD-10-CM | POA: Diagnosis not present

## 2015-02-26 DIAGNOSIS — E785 Hyperlipidemia, unspecified: Secondary | ICD-10-CM | POA: Diagnosis not present

## 2015-02-26 DIAGNOSIS — J439 Emphysema, unspecified: Secondary | ICD-10-CM | POA: Diagnosis not present

## 2015-02-26 DIAGNOSIS — I5189 Other ill-defined heart diseases: Secondary | ICD-10-CM

## 2015-02-26 DIAGNOSIS — G4733 Obstructive sleep apnea (adult) (pediatric): Secondary | ICD-10-CM | POA: Diagnosis not present

## 2015-02-26 DIAGNOSIS — N179 Acute kidney failure, unspecified: Secondary | ICD-10-CM

## 2015-02-26 DIAGNOSIS — Z720 Tobacco use: Secondary | ICD-10-CM | POA: Diagnosis present

## 2015-02-26 DIAGNOSIS — I120 Hypertensive chronic kidney disease with stage 5 chronic kidney disease or end stage renal disease: Secondary | ICD-10-CM | POA: Diagnosis not present

## 2015-02-26 DIAGNOSIS — R609 Edema, unspecified: Secondary | ICD-10-CM | POA: Diagnosis not present

## 2015-02-26 DIAGNOSIS — R0789 Other chest pain: Secondary | ICD-10-CM | POA: Diagnosis not present

## 2015-02-26 DIAGNOSIS — Z87891 Personal history of nicotine dependence: Secondary | ICD-10-CM | POA: Diagnosis not present

## 2015-02-26 DIAGNOSIS — N184 Chronic kidney disease, stage 4 (severe): Secondary | ICD-10-CM | POA: Diagnosis not present

## 2015-02-26 DIAGNOSIS — I129 Hypertensive chronic kidney disease with stage 1 through stage 4 chronic kidney disease, or unspecified chronic kidney disease: Secondary | ICD-10-CM | POA: Diagnosis not present

## 2015-02-26 DIAGNOSIS — N189 Chronic kidney disease, unspecified: Secondary | ICD-10-CM

## 2015-02-26 LAB — URINALYSIS, ROUTINE W REFLEX MICROSCOPIC
Bilirubin Urine: NEGATIVE
Glucose, UA: NEGATIVE mg/dL
Hgb urine dipstick: NEGATIVE
Ketones, ur: NEGATIVE mg/dL
Leukocytes, UA: NEGATIVE
Nitrite: NEGATIVE
Protein, ur: 30 mg/dL — AB
Specific Gravity, Urine: 1.016 (ref 1.005–1.030)
pH: 5 (ref 5.0–8.0)

## 2015-02-26 LAB — BASIC METABOLIC PANEL
Anion gap: 7 (ref 5–15)
BUN: 38 mg/dL — ABNORMAL HIGH (ref 6–20)
CO2: 28 mmol/L (ref 22–32)
Calcium: 9.2 mg/dL (ref 8.9–10.3)
Chloride: 104 mmol/L (ref 101–111)
Creatinine, Ser: 3.56 mg/dL — ABNORMAL HIGH (ref 0.61–1.24)
GFR calc Af Amer: 21 mL/min — ABNORMAL LOW (ref >60)
GFR calc non Af Amer: 18 mL/min — ABNORMAL LOW (ref >60)
Glucose, Bld: 131 mg/dL — ABNORMAL HIGH (ref 65–99)
Potassium: 4 mmol/L (ref 3.5–5.1)
Sodium: 139 mmol/L (ref 135–145)

## 2015-02-26 LAB — PROTEIN / CREATININE RATIO, URINE
Creatinine, Urine: 177.14 mg/dL
Protein Creatinine Ratio: 0.18 mg/mg{Cre} — ABNORMAL HIGH (ref 0.00–0.15)
Total Protein, Urine: 31 mg/dL

## 2015-02-26 LAB — GLUCOSE, CAPILLARY: Glucose-Capillary: 115 mg/dL — ABNORMAL HIGH (ref 65–99)

## 2015-02-26 LAB — URINE MICROSCOPIC-ADD ON

## 2015-02-26 LAB — TROPONIN I
Troponin I: <0.03 ng/mL (ref <0.031)
Troponin I: <0.03 ng/mL (ref <0.031)
Troponin I: 0.03 ng/mL (ref <0.031)

## 2015-02-26 MED ORDER — IPRATROPIUM-ALBUTEROL 0.5-2.5 (3) MG/3ML IN SOLN
3.0000 mL | Freq: Four times a day (QID) | RESPIRATORY_TRACT | Status: DC
  Administered 2015-02-26 (×2): 3 mL via RESPIRATORY_TRACT
  Filled 2015-02-26 (×2): qty 3

## 2015-02-26 MED ORDER — GUAIFENESIN ER 600 MG PO TB12
1200.0000 mg | ORAL_TABLET | Freq: Two times a day (BID) | ORAL | Status: DC
  Administered 2015-02-26 – 2015-02-28 (×5): 1200 mg via ORAL
  Filled 2015-02-26 (×6): qty 2

## 2015-02-26 MED ORDER — IPRATROPIUM-ALBUTEROL 0.5-2.5 (3) MG/3ML IN SOLN: 3.0000 mL | Freq: Four times a day (QID) | RESPIRATORY_TRACT | Status: DC | PRN

## 2015-02-26 MED ORDER — LORAZEPAM 2 MG/ML IJ SOLN
1.0000 mg | Freq: Once | INTRAMUSCULAR | Status: AC
  Administered 2015-02-27: 1 mg via INTRAVENOUS
  Filled 2015-02-26: qty 1

## 2015-02-26 MED ORDER — METHYLPREDNISOLONE SODIUM SUCC 125 MG IJ SOLR
60.0000 mg | Freq: Four times a day (QID) | INTRAMUSCULAR | Status: DC
  Administered 2015-02-26 – 2015-02-28 (×9): 60 mg via INTRAVENOUS
  Filled 2015-02-26 (×9): qty 2

## 2015-02-26 MED ORDER — FUROSEMIDE 80 MG PO TABS
80.0000 mg | ORAL_TABLET | Freq: Two times a day (BID) | ORAL | Status: DC
Start: 2015-02-26 — End: 2015-02-28
  Administered 2015-02-26 – 2015-02-28 (×4): 80 mg via ORAL
  Filled 2015-02-26 (×4): qty 1

## 2015-02-26 NOTE — Progress Notes (Signed)
Subjective: Patient admitted this morning, see detailed H&P by Dr Tamala Julian 54 year old male with a past medical history significant for hypertension, gout, chronic kidney disease stage III, and diastolic CHF (EF 0000000 with grade 1 diastolic dysfunction in [id number]); who presents with acute onset of chest pain. Symptoms started this morning in the center of his chest. He notes the pain was sharp and did not move. Coughing made symptoms worse. He notes associated symptoms of neck swelling 1 week ,and productive cough with whitish sputum production for the last day and a half. Patient denies trying anything at home as he does not have any albuterol treatments. Patient also noted a long history of kidney problems.  This morning patient is breathing better, but has significant leg edema. Also patient says that he has gained weight around 30 pounds. He also complains of swelling around his neck. He denies difficulty swallowing.  Filed Vitals:   02/26/15 0500 02/26/15 1235  BP: 155/77 131/70  Pulse: 82 86  Temp: 98.4 F (36.9 C) 98.8 F (37.1 C)  Resp: 18 19    HEENT-puffiness noted around the clavicles Chest: Clear Bilaterally Heart : S1S2 RRR Abdomen: Soft, nontender Ext : 2+ bilateral edema of the lower extremities  Neuro: Alert, oriented x 3  A/P  Atypical chest pain - troponin 3 is negative . Chest pain has resolved, likely from acute bronchitis.   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 I have consulted nephrology for aggressive diuresis for the leg edema/fluid overload. Dr Jamal Maes to see the patient today.  Swelling in the upper chest wall- will check CT chest to rule out subcutaneous emphysema  Hypertension- continue Coreg, hydralazine  Obstructive sleep apnea- patient scheduled to go for sleep study in January 2017.  Continue Cpap at bedtime.  Dyslipidemia-continue simvastatin  [facility] [phone]

## 2015-02-26 NOTE — Consult Note (Signed)
[facility] Consultation Note Requesting Physician:  [facility]  Reason for Consult:  CKD4, edema  HPI: The patient is a 54 y.o. year-old male with a background history of HTN, gout, CKD4. DM2, Diastolic CHF (LVEF 0000000) with DD. He was admitted to the hospital with chest pain, productive cough (felt probably related to acute bronchitis).  He has known CKD4 and his creatinines have fluctuated as can be seen below, but for the most part have been in the mid to high 3's since 2013.    He was seen in consultation on a prior admission in July 2016 by Dr. Jonnie Finner.  SPEP was unremarkable, UPEP showed no M-spike but he did have elevated urine free light chains and an elevated kappa/lambda ratio of 3.51. UPC was not done. Renal US showed 10.8, 10.6 cm kidneys with cortical thinning.    He has only been seen once at [facility] and that was about 8 or 9 years ago - has never had a followup appt (he moved away and was lost to f/u)  I think we were primarily asked to see to assist with diuresis for his significant LE edema.  I do not see that he has had any diuretics during this admission. He takes 20 mg of lasix BID at home. He says his swelling "comes and goes". He tries to watch his salt.  Does not use NSAIDS  He has no anemia issues. PTH status not known.   CREATININE, SER  Date/Time Value Ref Range Status  02/26/2015 06:48 AM 3.56* 0.61 - 1.24 mg/dL Final  02/25/2015 04:48 PM 3.56* 0.61 - 1.24 mg/dL Final  12/19/2014 02:38 PM 2.78* 0.61 - 1.24 mg/dL Final  10/15/2014 03:27 PM 3.25* 0.61 - 1.24 mg/dL Final  09/23/2014 04:15 AM 3.57* 0.61 - 1.24 mg/dL Final  09/22/2014 08:23 AM 3.55* 0.61 - 1.24 mg/dL Final  09/21/2014 04:31 PM 3.63* 0.61 - 1.24 mg/dL Final  09/13/2014 10:08 PM 3.06* 0.61 - 1.24 mg/dL Final  07/07/2014 10:53 PM 3.17* 0.50 - 1.35 mg/dL Final  11/19/2013 08:00 AM 3.44* 0.50 - 1.35 mg/dL Final  11/18/2013 03:35 AM 3.26* 0.50 - 1.35 mg/dL Final  11/17/2013  07:06 AM 3.04* 0.50 - 1.35 mg/dL Final  11/16/2013 08:05 PM 2.97* 0.50 - 1.35 mg/dL Final  12/13/2011 05:25 AM 3.61* 0.50 - 1.35 mg/dL Final  12/12/2011 04:39 AM 3.36* 0.50 - 1.35 mg/dL Final  12/11/2011 02:00 PM 3.46* 0.50 - 1.35 mg/dL Final  03/13/2009 09:16 AM 2.3* 0.4 - 1.5 mg/dL Final  05/16/2008 09:33 AM 2.2* 0.4-1.5 mg/dL Final  01/10/2007 08:25 PM 1.87*  Final  09/29/2006 11:36 AM 2.0* 0.4-1.5 mg/dL Final  03/03/2006 09:10 AM 2.4* 0.4-1.5 mg/dL Final    Past Medical History  Diagnosis Date  . Hypertension   . Gout   . 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 CHF (congestive heart failure) ([place])   . Shortness of breath dyspnea     Past Surgical History  Procedure Laterality Date  . Cholecystectomy    . Umbilical hernia repair       Family History  Problem Relation Age of Onset  . Stroke Mother   . Pneumonia Father     died of Pneumonia x3  . CAD Neg Hx   . Kidney disease Maternal Grandmother   . Hypertension Maternal Grandmother   . Healthy Maternal Grandfather   . Healthy Paternal Grandmother   . Healthy  Paternal Grandfather    Social History:  reports that he quit smoking yesterday. His smoking use included Cigarettes. He has a 15 pack-year smoking history. He has never used smokeless tobacco. He reports that he does not drink alcohol or use illicit drugs.  Allergies: No Known Allergies  Home medications: Prior to Admission medications   Medication Sig Start Date End Date Taking? Authorizing Provider  allopurinol (ZYLOPRIM) 300 MG tablet Take 0.5 tablets (150 mg total) by mouth daily. 09/23/14  Yes Ripudeep Krystal Eaton, MD  aspirin 325 MG tablet Take 1 tablet (325 mg total) by mouth daily. 09/23/14  Yes Ripudeep Krystal Eaton, MD  carvedilol (COREG) 25 MG tablet Take 1 tablet (25 mg total) by mouth 2 (two) times daily. 11/01/14  Yes Minus Breeding, MD  fenofibrate 54 MG tablet Take 1 tablet (54 mg total) by mouth  daily. 09/23/14  Yes Ripudeep Krystal Eaton, MD  furosemide (LASIX) 20 MG tablet Take 2 tablets (40 mg total) by mouth 2 (two) times daily. 09/23/14  Yes Ripudeep Krystal Eaton, MD  hydrALAZINE (APRESOLINE) 25 MG tablet Take 1.5 tablets (37.5 mg total) by mouth every 8 (eight) hours. 09/23/14  Yes Ripudeep Krystal Eaton, MD  isosorbide mononitrate (IMDUR) 30 MG 24 hr tablet Take 1 tablet (30 mg total) by mouth daily. 09/23/14  Yes Ripudeep Krystal Eaton, MD  potassium chloride SA (K-DUR,KLOR-CON) 20 MEQ tablet Take 1 tablet (20 mEq total) by mouth 2 (two) times daily. 09/23/14  Yes Ripudeep Krystal Eaton, MD  predniSONE (STERAPRED UNI-PAK 21 TAB) 10 MG (21) TBPK tablet use as directed ON BACK OF PACKAGE 12/18/14  Yes Golden Circle, FNP  HYDROcodone-acetaminophen (NORCO/VICODIN) 5-325 MG tablet Take 1-2 tablets by mouth every 6 (six) hours as needed for moderate pain or severe pain. 12/19/14   Hannah Muthersbaugh, PA-C  oxyCODONE (OXY IR/ROXICODONE) 5 MG immediate release tablet Take 1 tablet (5 mg total) by mouth every 6 (six) hours as needed for moderate pain or severe pain. 09/23/14   Ripudeep Krystal Eaton, MD  tiZANidine (ZANAFLEX) 4 MG tablet Take 1 tablet (4 mg total) by mouth every 8 (eight) hours as needed. 01/15/15   Golden Circle, FNP    Inpatient medications: . allopurinol  150 mg Oral Daily  . aspirin  325 mg Oral Daily  . carvedilol  25 mg Oral BID WC  . fenofibrate  54 mg Oral Daily  . guaiFENesin  1,200 mg Oral BID  . heparin  5,000 Units Subcutaneous 3 times per day  . hydrALAZINE  37.5 mg Oral 3 times per day  . isosorbide mononitrate  30 mg Oral Daily  . methylPREDNISolone (SOLU-MEDROL) injection  60 mg Intravenous Q6H  . potassium chloride SA  20 mEq Oral BID  . sodium chloride  3 mL Intravenous Q12H     Review of Systems See HPI    Physical Exam:  BP 131/70 mmHg  Pulse 86  Temp(Src) 98.8 F (37.1 C) (Oral)  Resp 19  Ht 6\' 2"  (1.88 m)  Wt 151.048 kg (333 lb)  BMI 42.74 kg/m2  SpO2 98%  Gen: Very tall, large framed  pleasant AAM  NAD VS as noted Skin: no rash, cyanosis Neck: no JVD Chest: Grossly clear Heart: S1S2 No S3 No murmur. Quiet precordium. Abdomen: soft, obese. Changes from prior umbilical hernia repair. No focal tenderness Ext: 2+ pitting edema RLE>LLE. Trace pitting of thighs, no pitting of abdominal wall.  Scars on legs from prior blisters that were associated with edema.  Neuro: alert, Ox3, no focal deficit  Labs: Basic Metabolic Panel:  Recent Labs Lab 02/25/15 1648 02/26/15 0648  NA 139 139  K 3.7 4.0  CL 102 104  CO2 30 28  GLUCOSE 115* 131*  BUN 36* 38*  CREATININE 3.56* 3.56*  CALCIUM 9.5 9.2   CBC:  Recent Labs Lab 02/25/15 1648  WBC 7.1  HGB 13.2  HCT 43.2  MCV 86.6  PLT 209    Recent Labs Lab 02/26/15 0039 02/26/15 0648 02/26/15 1154  TROPONINI <0.03 <0.03 0.03     Recent Labs Lab 02/26/15 0556  GLUCAP 115*     Dg Chest 2 View  02/25/2015  CLINICAL DATA:  Initial encounter for mid sharp chest pain starting today with productive cough and white sputum. EXAM: CHEST  2 VIEW COMPARISON:  10/30/2014. FINDINGS: The lungs are clear wiithout focal pneumonia, edema, pneumothorax or pleural effusion. The cardiopericardial silhouette is within normal limits for size. Cardiopericardial silhouette is at upper limits of normal for size. Imaged bony structures of the thorax are intact. IMPRESSION: Stable.  No acute cardiopulmonary findings. Electronically Signed   By: Misty Stanley M.D.   On: 02/25/2015 17:07     Background: 54 y.o. year-old male with a background history of HTN, gout, CKD4. DM2 (no meds), Diastolic CHF (LVEF 0000000) with DD. He was admitted with chest pain, productive cough (felt probably related to acute bronchitis).  He has known CKD4 and his creatinines have fluctuated as can be seen below, but for the most part have been in the mid to high 3's since 2013.  He was seen in consultation on a prior admission in July 2016 by Dr. Jonnie Finner.  SPEP was  unremarkable, UPEP showed no M-spike but he did have elevated urine free light chains and an elevated kappa/lambda ratio of 3.51. UPC was not done. Renal US showed 10.8, 10.6 cm kidneys with cortical thinning.  We were primarily asked to see to assist with diuresis for his significant LE edema.  I do not see that he has had any diuretics during this admission. He takes 20 mg of lasix BID at home. He says his swelling "comes and goes". He tries to watch his salt.  Does not use NSAIDS  He has no anemia issues. PTH status not known.  1. CKD4 - has been Stage 4 CKD ostensibly since 2013. Has not had a very fast rate of progression. Has had some workup in the past.  In the absence of significant proteinuria, most likely has nephrosclerosis.  No issues with anemia or metabolic acidosis.  Needs to re-establish outpt renal followup and agrees to see me in the office after discharge. 2. Edema - has not had any lasix since admission. Home dose too small for his degree of CKD4. Start w/80 mg BID and assess how he diureses.  3. CKD-MBD - assess PTH status. Check phosphorus (ordered) 4. Chest pain - atypical. Better. Felt probably bronchitis. Steroids/nebs ordered 5. HTN - carvedilol/hydralazine.  6. OSA - getting sleep eval 7. HLD - on statin 8. Gout - continue allopurinol   Jamal Maes,  MD [facility] [phone] pager 02/26/2015, 4:15 PM

## 2015-02-26 NOTE — Progress Notes (Signed)
Report received. VSS. No complaints of pain. Pt oriented to room. Call bell in place. Will continue to monitor closely.  Raliegh Ip RN

## 2015-02-26 NOTE — H&P (Addendum)
[facility] History and Physical  Craig Flynn [id number] DOB: Mar 04, 1961 DOA: 02/25/2015  Referring physician: ED PCP: Mauricio Po, FNP   Chief Complaint: Chest pain  HPI:  Mr. Craig Flynn is a 54 year old male with a past medical history significant for hypertension, gout, chronic kidney disease stage III, and diastolic CHF (EF 0000000 with grade 1 diastolic dysfunction in XX123456); who presents with acute onset of chest pain. Symptoms started this morning in the center of his chest. He notes the pain was sharp and did not move. Coughing made symptoms worse. He notes associated symptoms of neck swelling 1 week ,and productive cough with whitish sputum production for the last day and a half. Patient denies trying anything at home as he does not have any albuterol treatments.  Patient also noted a long history of kidney problems for which his creatinine fluctuates. Denies any decreased urine output.  Upon admission into the emergency department patient notes that the albuterol treatment that he was given a significantly help relieve symptoms. Chest x-ray showed no acute signs of infiltrate.   Review of Systems  Constitutional: Negative for fever, chills and diaphoresis.  HENT: Negative for tinnitus.   Eyes: Negative for photophobia and pain.  Respiratory: Positive for cough, sputum production and shortness of breath.   Cardiovascular: Positive for chest pain, orthopnea and leg swelling.  Gastrointestinal: Negative for nausea, abdominal pain, diarrhea and constipation.  Genitourinary: Negative for urgency and frequency.  Musculoskeletal: Positive for joint pain. Negative for falls.  Skin: Negative for itching and rash.  Neurological: Negative for tingling, focal weakness and headaches.  Endo/Heme/Allergies: Negative for polydipsia. Does not bruise/bleed easily.  Psychiatric/Behavioral: Negative for suicidal ideas.  \\     Past Medical History  Diagnosis Date  . Hypertension    . Gout   . 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 CHF (congestive heart failure) [facility])      Past Surgical History  Procedure Laterality Date  . Cholecystectomy    . Umbilical hernia repair        Social History:  reports that he has been smoking Cigarettes.  He has a 15 pack-year smoking history. He has never used smokeless tobacco. He reports that he does not drink alcohol or use illicit drugs. Where does patient live--home  with whom if at home?Wife Can patient participate in ADLs? Yes  No Known Allergies  Family History  Problem Relation Age of Onset  . Stroke Mother   . Pneumonia Father     died of Pneumonia x3  . CAD Neg Hx   . Kidney disease Maternal Grandmother   . Hypertension Maternal Grandmother   . Healthy Maternal Grandfather   . Healthy Paternal Grandmother   . Healthy Paternal Grandfather         Prior to Admission medications   Medication Sig Start Date End Date Taking? Authorizing Provider  allopurinol (ZYLOPRIM) 300 MG tablet Take 0.5 tablets (150 mg total) by mouth daily. 09/23/14  Yes Ripudeep Krystal Eaton, MD  aspirin 325 MG tablet Take 1 tablet (325 mg total) by mouth daily. 09/23/14  Yes Ripudeep Krystal Eaton, MD  carvedilol (COREG) 25 MG tablet Take 1 tablet (25 mg total) by mouth 2 (two) times daily. 11/01/14  Yes Minus Breeding, MD  fenofibrate 54 MG tablet Take 1 tablet (54 mg total) by mouth daily. 09/23/14  Yes Ripudeep Krystal Eaton, MD  furosemide (LASIX) 20 MG tablet  Take 2 tablets (40 mg total) by mouth 2 (two) times daily. 09/23/14  Yes Ripudeep Krystal Eaton, MD  hydrALAZINE (APRESOLINE) 25 MG tablet Take 1.5 tablets (37.5 mg total) by mouth every 8 (eight) hours. 09/23/14  Yes Ripudeep Krystal Eaton, MD  isosorbide mononitrate (IMDUR) 30 MG 24 hr tablet Take 1 tablet (30 mg total) by mouth daily. 09/23/14  Yes Ripudeep Krystal Eaton, MD  potassium chloride SA (K-DUR,KLOR-CON) 20 MEQ tablet Take 1 tablet (20 mEq total) by mouth 2  (two) times daily. 09/23/14  Yes Ripudeep Krystal Eaton, MD  predniSONE (STERAPRED UNI-PAK 21 TAB) 10 MG (21) TBPK tablet use as directed ON BACK OF PACKAGE 12/18/14  Yes Golden Circle, FNP  HYDROcodone-acetaminophen (NORCO/VICODIN) 5-325 MG tablet Take 1-2 tablets by mouth every 6 (six) hours as needed for moderate pain or severe pain. 12/19/14   Hannah Muthersbaugh, PA-C  oxyCODONE (OXY IR/ROXICODONE) 5 MG immediate release tablet Take 1 tablet (5 mg total) by mouth every 6 (six) hours as needed for moderate pain or severe pain. 09/23/14   Ripudeep Krystal Eaton, MD  tiZANidine (ZANAFLEX) 4 MG tablet Take 1 tablet (4 mg total) by mouth every 8 (eight) hours as needed. 01/15/15   Golden Circle, FNP     Physical Exam: Filed Vitals:   02/25/15 2330 02/26/15 0008 02/26/15 0253 02/26/15 0500  BP: 141/70 152/67  155/77  Pulse:  82  82  Temp:    98.4 F (36.9 C)  TempSrc:    Oral  Resp:  15  18  Height:      Weight:    151.048 kg (333 lb)  SpO2:  99% 98% 95%     Constitutional: Vital signs reviewed. Patient is a well-developed and well-nourished in no acute distress and cooperative with exam. Alert and oriented x3.  Head: Normocephalic and atraumatic  Ear: TM normal bilaterally  Mouth: no erythema or exudates, MMM  Eyes: PERRL, EOMI, conjunctivae normal, No scleral icterus.  Neck: Supple, Trachea midline normal ROM, No JVD, mass, thyromegaly, or carotid bruit present. Mild fullness but oral airway appears patent  Cardiovascular: RRR, S1 normal, S2 normal, no MRG, pulses symmetric and intact bilaterally. Pain is reproducible with palpation on physical exam Pulmonary/Chest: Positive wheezes bilaterally. no rales, or rhonchi . Able to talk in full sentences. Abdominal: Soft. Non-tender, non-distended, bowel sounds are normal, no masses, organomegaly, or guarding present.  GU: no CVA tenderness Musculoskeletal: No joint deformities, erythema, or stiffness, ROM full and no nontender Ext: +3 pitting edema on  the left leg and no edema on right leg and no cyanosis, pulses palpable bilaterally (DP and PT)  Hematology: no cervical, inginal, or axillary adenopathy.  Neurological: A&O x3, Strenght is normal and symmetric bilaterally, cranial nerve II-XII are grossly intact, no focal motor deficit, sensory intact to light touch bilaterally.  Skin: Warm, dry and intact. No rash, cyanosis, or clubbing.  Psychiatric: Normal mood and affect. speech and behavior is normal. Judgment and thought content normal. Cognition and memory are normal.      Data Review   Micro Results No results found for this or any previous visit (from the past 240 hour(s)).  Radiology Reports Dg Chest 2 View  02/25/2015  CLINICAL DATA:  Initial encounter for mid sharp chest pain starting today with productive cough and white sputum. EXAM: CHEST  2 VIEW COMPARISON:  10/30/2014. FINDINGS: The lungs are clear wiithout focal pneumonia, edema, pneumothorax or pleural effusion. The cardiopericardial silhouette is within normal limits for  size. Cardiopericardial silhouette is at upper limits of normal for size. Imaged bony structures of the thorax are intact. IMPRESSION: Stable.  No acute cardiopulmonary findings. Electronically Signed   By: Misty Stanley M.D.   On: 02/25/2015 17:07     CBC  Recent Labs Lab 02/25/15 1648  WBC 7.1  HGB 13.2  HCT 43.2  PLT 209  MCV 86.6  MCH 26.5  MCHC 30.6  RDW 16.2*    Chemistries   Recent Labs Lab 02/25/15 1648  NA 139  K 3.7  CL 102  CO2 30  GLUCOSE 115*  BUN 36*  CREATININE 3.56*  CALCIUM 9.5   ------------------------------------------------------------------------------------------------------------------ estimated creatinine clearance is 36.8 mL/min (by C-G formula based on Cr of 3.56). ------------------------------------------------------------------------------------------------------------------ No results for input(s): HGBA1C in the last 72  hours. ------------------------------------------------------------------------------------------------------------------ No results for input(s): CHOL, HDL, LDLCALC, TRIG, CHOLHDL, LDLDIRECT in the last 72 hours. ------------------------------------------------------------------------------------------------------------------ No results for input(s): TSH, T4TOTAL, T3FREE, THYROIDAB in the last 72 hours.  Invalid input(s): FREET3 ------------------------------------------------------------------------------------------------------------------ No results for input(s): VITAMINB12, FOLATE, FERRITIN, TIBC, IRON, RETICCTPCT in the last 72 hours.  Coagulation profile No results for input(s): INR, PROTIME in the last 168 hours.  No results for input(s): DDIMER in the last 72 hours.  Cardiac Enzymes  Recent Labs Lab 02/26/15 0039  TROPONINI <0.03   ------------------------------------------------------------------------------------------------------------------ Invalid input(s): POCBNP   CBG:  Recent Labs Lab 02/26/15 0556  GLUCAP 115*       EKG: Independently reviewed. Sinus rhythm with T-wave abnormalities   Assessment/Plan Principal Problem:   Atypical chest pain: Acute. Symptoms appear to be more related to patient's symptoms of cough. Chest pain symptoms are reproducible on palpation with physical exam. Initial EKG showing sinus rhythm with some T-wave abnormalities and initial troponin 0.03. Patient does not appear to be in acute heart failure as BMP was 22.  -Troponins 3 -Monitoring patient on telemetry  Acute bronchitis: Patient with complaints of cough with shortness of breath and wheezing. Chest x-ray negative. Symptoms improved with breathing treatments. Patient is afebrile. Denies having albuterol at home. Likely provoked from history of cigarette smoking. Although symptoms could be viral in nature. -Albuterol neb treatments every 6 hours and prn orders of  breath  Suspected acute on chronic renal failure. Patient's baseline ranges between 2.7 and 3.57 per review of previous lab work. -Held Lasix -Repeat BMP in a.m.   HTN (hypertension) -Continue hydralazine, Coreg  Diastolic dysfunction grade 1L Stable. echocardiogram performed 09/2014 showing EF of 55-60% and grade 1 diastolic dysfunction.   OSA (obstructive sleep apnea): Patient scheduled to have a sleep study performed January 17   -respiratory place on CPAP overnight to help with overall breathing    Dyslipidemia -continue simvastatin  Tobacco abuse: Patient reports smoking half pack cigarettes per day  -Counseled on the need for cessation of tobacco as this likely precipitated presenting symptoms   Code Status:   full Family Communication: bedside Disposition Plan: admit   Total time spent 55 minutes.Greater than 50% of this time was spent in counseling, explanation of diagnosis, planning of further management, and coordination of care  [facility] Pager [phone]  If 7PM-7AM, please contact night-coverage [email or web address] Password [facility] 02/26/2015, 6:55 AM

## 2015-02-26 NOTE — Progress Notes (Signed)
Pt refused CPAP for the night.   

## 2015-02-26 NOTE — Progress Notes (Signed)
Pt refused cpap, states he does not wear one at home

## 2015-02-27 ENCOUNTER — Observation Stay (HOSPITAL_COMMUNITY): Payer: Commercial Managed Care - HMO

## 2015-02-27 ENCOUNTER — Observation Stay (HOSPITAL_BASED_OUTPATIENT_CLINIC_OR_DEPARTMENT_OTHER): Payer: Commercial Managed Care - HMO

## 2015-02-27 ENCOUNTER — Encounter (HOSPITAL_COMMUNITY): Payer: Self-pay

## 2015-02-27 DIAGNOSIS — Z87891 Personal history of nicotine dependence: Secondary | ICD-10-CM | POA: Diagnosis not present

## 2015-02-27 DIAGNOSIS — R0789 Other chest pain: Secondary | ICD-10-CM

## 2015-02-27 DIAGNOSIS — M7989 Other specified soft tissue disorders: Secondary | ICD-10-CM | POA: Diagnosis not present

## 2015-02-27 DIAGNOSIS — G4733 Obstructive sleep apnea (adult) (pediatric): Secondary | ICD-10-CM | POA: Diagnosis not present

## 2015-02-27 DIAGNOSIS — N179 Acute kidney failure, unspecified: Secondary | ICD-10-CM | POA: Diagnosis not present

## 2015-02-27 DIAGNOSIS — N184 Chronic kidney disease, stage 4 (severe): Secondary | ICD-10-CM | POA: Diagnosis not present

## 2015-02-27 DIAGNOSIS — E785 Hyperlipidemia, unspecified: Secondary | ICD-10-CM | POA: Diagnosis not present

## 2015-02-27 DIAGNOSIS — J439 Emphysema, unspecified: Secondary | ICD-10-CM | POA: Diagnosis not present

## 2015-02-27 DIAGNOSIS — I120 Hypertensive chronic kidney disease with stage 5 chronic kidney disease or end stage renal disease: Secondary | ICD-10-CM | POA: Diagnosis not present

## 2015-02-27 DIAGNOSIS — I2510 Atherosclerotic heart disease of native coronary artery without angina pectoris: Secondary | ICD-10-CM | POA: Diagnosis not present

## 2015-02-27 DIAGNOSIS — R609 Edema, unspecified: Secondary | ICD-10-CM | POA: Diagnosis not present

## 2015-02-27 DIAGNOSIS — J209 Acute bronchitis, unspecified: Secondary | ICD-10-CM | POA: Diagnosis not present

## 2015-02-27 DIAGNOSIS — I129 Hypertensive chronic kidney disease with stage 1 through stage 4 chronic kidney disease, or unspecified chronic kidney disease: Secondary | ICD-10-CM | POA: Diagnosis not present

## 2015-02-27 LAB — RENAL FUNCTION PANEL
Albumin: 3.4 g/dL — ABNORMAL LOW (ref 3.5–5.0)
Anion gap: 9 (ref 5–15)
BUN: 43 mg/dL — ABNORMAL HIGH (ref 6–20)
CO2: 28 mmol/L (ref 22–32)
Calcium: 9.6 mg/dL (ref 8.9–10.3)
Chloride: 100 mmol/L — ABNORMAL LOW (ref 101–111)
Creatinine, Ser: 3.6 mg/dL — ABNORMAL HIGH (ref 0.61–1.24)
GFR calc Af Amer: 21 mL/min — ABNORMAL LOW (ref >60)
GFR calc non Af Amer: 18 mL/min — ABNORMAL LOW (ref >60)
Glucose, Bld: 161 mg/dL — ABNORMAL HIGH (ref 65–99)
Phosphorus: 2.7 mg/dL (ref 2.5–4.6)
Potassium: 4.4 mmol/L (ref 3.5–5.1)
Sodium: 137 mmol/L (ref 135–145)

## 2015-02-27 LAB — CBC
HCT: 39 % (ref 39.0–52.0)
Hemoglobin: 12 g/dL — ABNORMAL LOW (ref 13.0–17.0)
MCH: 26.4 pg (ref 26.0–34.0)
MCHC: 30.8 g/dL (ref 30.0–36.0)
MCV: 85.9 fL (ref 78.0–100.0)
Platelets: 184 10*3/uL (ref 150–400)
RBC: 4.54 MIL/uL (ref 4.22–5.81)
RDW: 16 % — ABNORMAL HIGH (ref 11.5–15.5)
WBC: 5.6 10*3/uL (ref 4.0–10.5)

## 2015-02-27 LAB — D-DIMER, QUANTITATIVE (NOT AT ARMC): D-Dimer, Quant: 0.64 ug/mL-FEU — ABNORMAL HIGH (ref 0.00–0.50)

## 2015-02-27 LAB — GLUCOSE, CAPILLARY: Glucose-Capillary: 165 mg/dL — ABNORMAL HIGH (ref 65–99)

## 2015-02-27 MED ORDER — FUROSEMIDE 80 MG PO TABS: 80.0000 mg | ORAL_TABLET | Freq: Two times a day (BID) | ORAL | Status: DC

## 2015-02-27 MED ORDER — ALBUTEROL SULFATE HFA 108 (90 BASE) MCG/ACT IN AERS: 2.0000 | INHALATION_SPRAY | Freq: Four times a day (QID) | RESPIRATORY_TRACT | Status: DC | PRN

## 2015-02-27 MED ORDER — GUAIFENESIN ER 600 MG PO TB12: 1200.0000 mg | ORAL_TABLET | Freq: Two times a day (BID) | ORAL | Status: DC

## 2015-02-27 MED ORDER — HYDRALAZINE HCL 25 MG PO TABS: 37.5000 mg | ORAL_TABLET | Freq: Three times a day (TID) | ORAL | Status: DC

## 2015-02-27 MED ORDER — PREDNISONE 20 MG PO TABS: 20.0000 mg | ORAL_TABLET | Freq: Every day | ORAL | Status: DC

## 2015-02-27 NOTE — Progress Notes (Addendum)
[facility] Rounding Note  Subjective:  No new issues  Objective:    Vital signs in last 24 hours: Filed Vitals:   02/26/15 1235 02/26/15 2024 02/27/15 0434 02/27/15 0835  BP: 131/70 141/69 157/91 180/90  Pulse: 86 81 84 89  Temp: 98.8 F (37.1 C) 98.1 F (36.7 C) 97.9 F (36.6 C) 97.4 F (36.3 C)  TempSrc: Oral  Oral Oral  Resp: 19 16 18    Height:      Weight:   149.052 kg (328 lb 9.6 oz)   SpO2: 98% 100% 97% 96%   Weight change: -2.087 kg (-4 lb 9.6 oz)  Intake/Output Summary (Last 24 hours) at 02/27/15 1024 Last data filed at 02/27/15 0800  Gross per 24 hour  Intake   1608 ml  Output   1675 ml  Net    -67 ml    Physical Exam:  Blood pressure 180/90, pulse 89, temperature 97.4 F (36.3 C), temperature source Oral, resp. rate 18, height 6\' 2"  (1.88 m), weight 149.052 kg (328 lb 9.6 oz), SpO2 96 %. Gen: Very tall, large framed pleasant AAM NAD VS as noted Skin: no rash, cyanosis Neck: no JVD Chest: Grossly clear Heart: S1S2 No S3 No murmur. Quiet precordium. Abdomen: soft, obese. Changes from prior umbilical hernia repair. No focal tenderness Ext: 2+ pitting edema RLE>LLE. Trace pitting of thighs, no pitting of abdominal wall. Scars on legs from prior blisters that were associated with edema.  Neuro: alert, Ox3, no focal deficit   Serial Creatinine Trending for reference 02/26/2015 06:48 AM 3.56* 0.61 - 1.24 mg/dL Final  02/25/2015 04:48 PM 3.56* 0.61 - 1.24 mg/dL Final  12/19/2014 02:38 PM 2.78* 0.61 - 1.24 mg/dL Final  10/15/2014 03:27 PM 3.25* 0.61 - 1.24 mg/dL Final  09/23/2014 04:15 AM 3.57* 0.61 - 1.24 mg/dL Final  09/22/2014 08:23 AM 3.55* 0.61 - 1.24 mg/dL Final  09/21/2014 04:31 PM 3.63* 0.61 - 1.24 mg/dL Final  09/13/2014 10:08 PM 3.06* 0.61 - 1.24 mg/dL Final  07/07/2014 10:53 PM 3.17* 0.50 - 1.35 mg/dL Final  11/19/2013 08:00 AM 3.44* 0.50 - 1.35 mg/dL Final  11/18/2013 03:35 AM 3.26* 0.50  - 1.35 mg/dL Final  11/17/2013 07:06 AM 3.04* 0.50 - 1.35 mg/dL Final  11/16/2013 08:05 PM 2.97* 0.50 - 1.35 mg/dL Final  12/13/2011 05:25 AM 3.61* 0.50 - 1.35 mg/dL Final  12/12/2011 04:39 AM 3.36* 0.50 - 1.35 mg/dL Final  12/11/2011 02:00 PM 3.46* 0.50 - 1.35 mg/dL Final          Current/Recent Labs:   Recent Labs Lab 02/25/15 1648 02/26/15 0648 02/27/15 0230  NA 139 139 137  K 3.7 4.0 4.4  CL 102 104 100*  CO2 30 28 28   GLUCOSE 115* 131* 161*  BUN 36* 38* 43*  CREATININE 3.56* 3.56* 3.60*  CALCIUM 9.5 9.2 9.6  PHOS  --   --  2.7     Recent Labs Lab 02/27/15 0230  ALBUMIN 3.4*    Recent Labs Lab 02/25/15 1648 02/27/15 0230  WBC 7.1 5.6  HGB 13.2 12.0*  HCT 43.2 39.0  MCV 86.6 85.9  PLT 209 184       Recent Labs Lab 02/26/15 0039 02/26/15 0648 02/26/15 1154  TROPONINI <0.03 <0.03 0.03     Recent Labs Lab 02/26/15 0556 02/27/15 0550  GLUCAP 115* 165*   Results for MAXXON, KUKURA (MRN [id number]) as of 02/27/2015 10:23  Ref. Range 02/26/2015 19:39  Appearance Latest Ref Range: CLEAR  CLEAR  Bacteria, UA Latest Ref Range: NONE SEEN  RARE (A)  Bilirubin Urine Latest Ref Range: NEGATIVE  NEGATIVE  Color, Urine Latest Ref Range: YELLOW  YELLOW  Glucose Latest Ref Range: NEGATIVE mg/dL NEGATIVE  Hgb urine dipstick Latest Ref Range: NEGATIVE  NEGATIVE  Ketones, ur Latest Ref Range: NEGATIVE mg/dL NEGATIVE  Leukocytes, UA Latest Ref Range: NEGATIVE  NEGATIVE  Nitrite Latest Ref Range: NEGATIVE  NEGATIVE  pH Latest Ref Range: 5.0-8.0  5.0  Protein Latest Ref Range: NEGATIVE mg/dL 30 (A)  RBC / HPF Latest Ref Range: 0-5 RBC/hpf 0-5  Specific Gravity, Urine Latest Ref Range: 1.005-1.030  1.016  Squamous Epithelial / LPF Latest Ref Range: NONE SEEN  0-5 (A)  WBC, UA Latest Ref Range: 0-5 WBC/hpf 0-5  Total Protein, Urine Latest Units: mg/dL 31  Protein Creatinine Ratio Latest Ref Range: 0.00-0.15 mg/mgCre 0.18 (H)   Creatinine, Urine Latest Units: mg/dL 177.14      Dg Chest 2 View  02/25/2015  CLINICAL DATA:  Initial encounter for mid sharp chest pain starting today with productive cough and white sputum. EXAM: CHEST  2 VIEW COMPARISON:  10/30/2014. FINDINGS: The lungs are clear wiithout focal pneumonia, edema, pneumothorax or pleural effusion. The cardiopericardial silhouette is within normal limits for size. Cardiopericardial silhouette is at upper limits of normal for size. Imaged bony structures of the thorax are intact. IMPRESSION: Stable.  No acute cardiopulmonary findings. Electronically Signed   By: Misty Stanley M.D.   On: 02/25/2015 17:07   Ct Chest Wo Contrast  02/27/2015  CLINICAL DATA:  Subcutaneous emphysema. EXAM: CT CHEST WITHOUT CONTRAST TECHNIQUE: Multidetector CT imaging of the chest was performed following the standard protocol without IV contrast. COMPARISON:  Chest x-ray 02/25/2015 FINDINGS: Lungs are well inflated without consolidation or effusion. Airways are normal. Heart is normal in size. There is mild calcified plaque over the left main and 3 vessel coronary arteries. There is no mediastinal, hilar or axillary adenopathy. There is mild calcified plaque over the thoracic aorta. Remaining mediastinal structures are within normal. 5 mm hypodensity over the left lobe of the thyroid. Tiny focus of air over the right sternoclavicular joint without adjacent fluid or inflammatory change. No evidence of subcutaneous emphysema. Images through the upper abdomen demonstrate evidence of a previous cholecystectomy. Minimal calcified plaque over the abdominal aorta. Minimal degenerate change of the spine. IMPRESSION: No acute cardiopulmonary disease. No evidence of subcutaneous emphysema. Mild 3 vessel atherosclerotic coronary artery disease. 5 mm hypodense left thyroid nodule. Electronically Signed   By: Marin Olp M.D.   On: 02/27/2015 08:02   Current Medications   . allopurinol  150 mg Oral  Daily  . aspirin  325 mg Oral Daily  . carvedilol  25 mg Oral BID WC  . fenofibrate  54 mg Oral Daily  . furosemide  80 mg Oral BID  . guaiFENesin  1,200 mg Oral BID  . heparin  5,000 Units Subcutaneous 3 times per day  . hydrALAZINE  37.5 mg Oral 3 times per day  . isosorbide mononitrate  30 mg Oral Daily  . methylPREDNISolone (SOLU-MEDROL) injection  60 mg Intravenous Q6H  . potassium chloride SA  20 mEq Oral BID  . sodium chloride  3 mL Intravenous Q12H   Background: 54 y.o. year-old male with a background history of HTN, gout, CKD4. DM2 (no meds), Diastolic CHF (LVEF 0000000) with DD. He was admitted with chest pain, productive cough (felt probably related to acute bronchitis). He has known CKD4 and  his creatinines have fluctuated as can be seen below, but for the most part have been in the mid to high 3's since 2013. He was seen in consultation on a prior admission in July 2016 by Dr. Jonnie Finner. SPEP was unremarkable, UPEP showed no M-spike but he did have elevated urine free light chains and an elevated kappa/lambda ratio of 3.51. UPC was not done. Renal US showed 10.8, 10.6 cm kidneys with cortical thinning. We were primarily asked to see to assist with diuresis for his significant LE edema. No diuretics were administered during the admission prior to consultation. Home lasix =  20 mg BID. He says his swelling "comes and goes". He tries to watch his salt. Does not use NSAIDS He has no anemia issues.  Assessment/Recommendations  1. CKD4 - has been Stage 4 CKD ostensibly since 2013. Has not had a very fast rate of progression. Has had some workup in the past. In the absence of significant proteinuria (UPC 177 mg) , most likely has nephrosclerosis. No issues with anemia or metabolic acidosis. Needs to re-establish outpt renal followup and agrees to see me in the office after discharge. (I have already set up an appt for 04/03/15 at 11:30 AM) 2. Edema - Home lasix dose too small for his  degree of CKD4. Started 80 mg BID yesterday - has only had 1 dose. UOP recorded as 1500 cc past 24 hours. Will need to assess how he diureses. He will purchase scales, weigh at home, call my office in a week with weights. 3. CKD-MBD - assess PTH status (286 July 2016) Ordered repeat - pending.  Phosphorus normal.  4. Chest pain - atypical. Better. Felt probably bronchitis. Steroids/nebs ordered 5. HTN - carvedilol/hydralazine.  6. OSA - getting sleep eval 7. HLD - on statin 8. CT scan with 3V CAD 9. Gout - continue allopurinol 10. Disposition: I think OK for discharge from a renal standpoint. F/U with me - see #1 and #2 for recs   Jamal Maes, MD [place] [phone] Pager 02/27/2015, 10:24 AM

## 2015-02-27 NOTE — Discharge Summary (Signed)
Physician Discharge Summary  Craig Flynn MRN: [id number] DOB/AGE: 07/05/1960 54 y.o.  PCP: Mauricio Po, FNP   Admit date: 02/25/2015 Discharge date: 02/27/2015  Discharge Diagnoses:     Principal Problem:   Atypical chest pain Active Problems:   Dyslipidemia   Acute on chronic renal failure (HCC)   HTN (hypertension)   Morbid obesity (HCC)   OSA (obstructive sleep apnea)   Hyperlipidemia   Acute bronchitis   Diastolic dysfunction   Tobacco abuse    Follow-up recommendations Follow-up with PCP in 3-5 days , including all  additional recommended appointments as below Follow-up CBC, CMP in 3-5 days 1. appt for 04/03/15 at 11:30 AM with Jamal Maes, MD     Medication List    STOP taking these medications        predniSONE 10 MG (21) Tbpk tablet  Commonly known as:  STERAPRED UNI-PAK 21 TAB  Replaced by:  predniSONE 20 MG tablet      TAKE these medications        albuterol 108 (90 BASE) MCG/ACT inhaler  Commonly known as:  PROVENTIL HFA;VENTOLIN HFA  Inhale 2 puffs into the lungs every 6 (six) hours as needed for wheezing or shortness of breath.     allopurinol 300 MG tablet  Commonly known as:  ZYLOPRIM  Take 0.5 tablets (150 mg total) by mouth daily.     aspirin 325 MG tablet  Take 1 tablet (325 mg total) by mouth daily.     carvedilol 25 MG tablet  Commonly known as:  COREG  Take 1 tablet (25 mg total) by mouth 2 (two) times daily.     fenofibrate 54 MG tablet  Take 1 tablet (54 mg total) by mouth daily.     furosemide 80 MG tablet  Commonly known as:  LASIX  Take 1 tablet (80 mg total) by mouth 2 (two) times daily.     guaiFENesin 600 MG 12 hr tablet  Commonly known as:  MUCINEX  Take 2 tablets (1,200 mg total) by mouth 2 (two) times daily.     hydrALAZINE 25 MG tablet  Commonly known as:  APRESOLINE  Take 1.5 tablets (37.5 mg total) by mouth 3 (three) times daily.     HYDROcodone-acetaminophen 5-325 MG tablet  Commonly known as:   NORCO/VICODIN  Take 1-2 tablets by mouth every 6 (six) hours as needed for moderate pain or severe pain.     isosorbide mononitrate 30 MG 24 hr tablet  Commonly known as:  IMDUR  Take 1 tablet (30 mg total) by mouth daily.     oxyCODONE 5 MG immediate release tablet  Commonly known as:  Oxy IR/ROXICODONE  Take 1 tablet (5 mg total) by mouth every 6 (six) hours as needed for moderate pain or severe pain.     potassium chloride SA 20 MEQ tablet  Commonly known as:  K-DUR,KLOR-CON  Take 1 tablet (20 mEq total) by mouth 2 (two) times daily.     predniSONE 20 MG tablet  Commonly known as:  DELTASONE  Take 1 tablet (20 mg total) by mouth daily with breakfast.     tiZANidine 4 MG tablet  Commonly known as:  ZANAFLEX  Take 1 tablet (4 mg total) by mouth every 8 (eight) hours as needed.         Discharge Condition:  Stable  Discharge Instructions       Discharge Instructions    Diet - low sodium heart healthy    Complete by:  As directed      Increase activity slowly    Complete by:  As directed            No Known Allergies    Disposition: 07-Left Against Medical Advice   Consults:  nephrology    Significant Diagnostic Studies:  Dg Chest 2 View  02/25/2015  CLINICAL DATA:  Initial encounter for mid sharp chest pain starting today with productive cough and white sputum. EXAM: CHEST  2 VIEW COMPARISON:  10/30/2014. FINDINGS: The lungs are clear wiithout focal pneumonia, edema, pneumothorax or pleural effusion. The cardiopericardial silhouette is within normal limits for size. Cardiopericardial silhouette is at upper limits of normal for size. Imaged bony structures of the thorax are intact. IMPRESSION: Stable.  No acute cardiopulmonary findings. Electronically Signed   By: Misty Stanley M.D.   On: 02/25/2015 17:07   Ct Chest Wo Contrast  02/27/2015  CLINICAL DATA:  Subcutaneous emphysema. EXAM: CT CHEST WITHOUT CONTRAST TECHNIQUE: Multidetector CT imaging of the  chest was performed following the standard protocol without IV contrast. COMPARISON:  Chest x-ray 02/25/2015 FINDINGS: Lungs are well inflated without consolidation or effusion. Airways are normal. Heart is normal in size. There is mild calcified plaque over the left main and 3 vessel coronary arteries. There is no mediastinal, hilar or axillary adenopathy. There is mild calcified plaque over the thoracic aorta. Remaining mediastinal structures are within normal. 5 mm hypodensity over the left lobe of the thyroid. Tiny focus of air over the right sternoclavicular joint without adjacent fluid or inflammatory change. No evidence of subcutaneous emphysema. Images through the upper abdomen demonstrate evidence of a previous cholecystectomy. Minimal calcified plaque over the abdominal aorta. Minimal degenerate change of the spine. IMPRESSION: No acute cardiopulmonary disease. No evidence of subcutaneous emphysema. Mild 3 vessel atherosclerotic coronary artery disease. 5 mm hypodense left thyroid nodule. Electronically Signed   By: Marin Olp M.D.   On: 02/27/2015 08:02        Filed Weights   02/25/15 1646 02/26/15 0500 02/27/15 0434  Weight: 151.139 kg (333 lb 3.2 oz) 151.048 kg (333 lb) 149.052 kg (328 lb 9.6 oz)     Microbiology: No results found for this or any previous visit (from the past 240 hour(s)).     Blood Culture    Component Value Date/Time   SDES BLOOD LEFT HAND 11/17/2013 0840   SPECREQUEST BOTTLES DRAWN AEROBIC ONLY 4CC 11/17/2013 0840   CULT  11/17/2013 0840    NO GROWTH 5 DAYS Performed at [facility] 11/23/2013 FINAL 11/17/2013 0840      Labs: Results for orders placed or performed during the hospital encounter of 02/25/15 (from the past 48 hour(s))  Basic metabolic panel     Status: Abnormal   Collection Time: 02/25/15  4:48 PM  Result Value Ref Range   Sodium 139 135 - 145 mmol/L   Potassium 3.7 3.5 - 5.1 mmol/L   Chloride 102 101 - 111  mmol/L   CO2 30 22 - 32 mmol/L   Glucose, Bld 115 (H) 65 - 99 mg/dL   BUN 36 (H) 6 - 20 mg/dL   Creatinine, Ser 3.56 (H) 0.61 - 1.24 mg/dL   Calcium 9.5 8.9 - 10.3 mg/dL   GFR calc non Af Amer 18 (L) >60 mL/min   GFR calc Af Amer 21 (L) >60 mL/min    Comment: (NOTE) The eGFR has been calculated using the CKD EPI equation. This calculation has not been validated in  all clinical situations. eGFR's persistently <60 mL/min signify possible Chronic Kidney Disease.    Anion gap 7 5 - 15  CBC     Status: Abnormal   Collection Time: 02/25/15  4:48 PM  Result Value Ref Range   WBC 7.1 4.0 - 10.5 K/uL   RBC 4.99 4.22 - 5.81 MIL/uL   Hemoglobin 13.2 13.0 - 17.0 g/dL   HCT 43.2 39.0 - 52.0 %   MCV 86.6 78.0 - 100.0 fL   MCH 26.5 26.0 - 34.0 pg   MCHC 30.6 30.0 - 36.0 g/dL   RDW 16.2 (H) 11.5 - 15.5 %   Platelets 209 150 - 400 K/uL  I-stat troponin, ED (not at [facility])     Status: None   Collection Time: 02/25/15  5:02 PM  Result Value Ref Range   Troponin i, poc 0.03 0.00 - 0.08 ng/mL   Comment 3            Comment: Due to the release kinetics of cTnI, a negative result within the first hours of the onset of symptoms does not rule out myocardial infarction with certainty. If myocardial infarction is still suspected, repeat the test at appropriate intervals.   Brain natriuretic peptide     Status: None   Collection Time: 02/25/15  9:03 PM  Result Value Ref Range   B Natriuretic Peptide 22.0 0.0 - 100.0 pg/mL  Troponin I (q 6hr x 3)     Status: None   Collection Time: 02/26/15 12:39 AM  Result Value Ref Range   Troponin I <0.03 <0.031 ng/mL    Comment:        NO INDICATION OF MYOCARDIAL INJURY.   Glucose, capillary     Status: Abnormal   Collection Time: 02/26/15  5:56 AM  Result Value Ref Range   Glucose-Capillary 115 (H) 65 - 99 mg/dL  Troponin I (q 6hr x 3)     Status: None   Collection Time: 02/26/15  6:48 AM  Result Value Ref Range   Troponin I <0.03 <0.031 ng/mL     Comment:        NO INDICATION OF MYOCARDIAL INJURY.   Basic metabolic panel     Status: Abnormal   Collection Time: 02/26/15  6:48 AM  Result Value Ref Range   Sodium 139 135 - 145 mmol/L   Potassium 4.0 3.5 - 5.1 mmol/L   Chloride 104 101 - 111 mmol/L   CO2 28 22 - 32 mmol/L   Glucose, Bld 131 (H) 65 - 99 mg/dL   BUN 38 (H) 6 - 20 mg/dL   Creatinine, Ser 3.56 (H) 0.61 - 1.24 mg/dL   Calcium 9.2 8.9 - 10.3 mg/dL   GFR calc non Af Amer 18 (L) >60 mL/min   GFR calc Af Amer 21 (L) >60 mL/min    Comment: (NOTE) The eGFR has been calculated using the CKD EPI equation. This calculation has not been validated in all clinical situations. eGFR's persistently <60 mL/min signify possible Chronic Kidney Disease.    Anion gap 7 5 - 15  Troponin I (q 6hr x 3)     Status: None   Collection Time: 02/26/15 11:54 AM  Result Value Ref Range   Troponin I 0.03 <0.031 ng/mL    Comment:        NO INDICATION OF MYOCARDIAL INJURY.   Urinalysis, Routine w reflex microscopic (not at [facility])     Status: Abnormal   Collection Time: 02/26/15  7:39 PM  Result Value Ref Range   Color, Urine YELLOW YELLOW   APPearance CLEAR CLEAR   Specific Gravity, Urine 1.016 1.005 - 1.030   pH 5.0 5.0 - 8.0   Glucose, UA NEGATIVE NEGATIVE mg/dL   Hgb urine dipstick NEGATIVE NEGATIVE   Bilirubin Urine NEGATIVE NEGATIVE   Ketones, ur NEGATIVE NEGATIVE mg/dL   Protein, ur 30 (A) NEGATIVE mg/dL   Nitrite NEGATIVE NEGATIVE   Leukocytes, UA NEGATIVE NEGATIVE  Protein / creatinine ratio, urine     Status: Abnormal   Collection Time: 02/26/15  7:39 PM  Result Value Ref Range   Creatinine, Urine 177.14 mg/dL   Total Protein, Urine 31 mg/dL    Comment: NO NORMAL RANGE ESTABLISHED FOR THIS TEST   Protein Creatinine Ratio 0.18 (H) 0.00 - 0.15 mg/mg[Cre]  Urine microscopic-add on     Status: Abnormal   Collection Time: 02/26/15  7:39 PM  Result Value Ref Range   Squamous Epithelial / LPF 0-5 (A) NONE SEEN   WBC, UA 0-5  0 - 5 WBC/hpf   RBC / HPF 0-5 0 - 5 RBC/hpf   Bacteria, UA RARE (A) NONE SEEN  CBC     Status: Abnormal   Collection Time: 02/27/15  2:30 AM  Result Value Ref Range   WBC 5.6 4.0 - 10.5 K/uL   RBC 4.54 4.22 - 5.81 MIL/uL   Hemoglobin 12.0 (L) 13.0 - 17.0 g/dL   HCT 39.0 39.0 - 52.0 %   MCV 85.9 78.0 - 100.0 fL   MCH 26.4 26.0 - 34.0 pg   MCHC 30.8 30.0 - 36.0 g/dL   RDW 16.0 (H) 11.5 - 15.5 %   Platelets 184 150 - 400 K/uL  Renal function panel     Status: Abnormal   Collection Time: 02/27/15  2:30 AM  Result Value Ref Range   Sodium 137 135 - 145 mmol/L   Potassium 4.4 3.5 - 5.1 mmol/L   Chloride 100 (L) 101 - 111 mmol/L   CO2 28 22 - 32 mmol/L   Glucose, Bld 161 (H) 65 - 99 mg/dL   BUN 43 (H) 6 - 20 mg/dL   Creatinine, Ser 3.60 (H) 0.61 - 1.24 mg/dL   Calcium 9.6 8.9 - 10.3 mg/dL   Phosphorus 2.7 2.5 - 4.6 mg/dL   Albumin 3.4 (L) 3.5 - 5.0 g/dL   GFR calc non Af Amer 18 (L) >60 mL/min   GFR calc Af Amer 21 (L) >60 mL/min    Comment: (NOTE) The eGFR has been calculated using the CKD EPI equation. This calculation has not been validated in all clinical situations. eGFR's persistently <60 mL/min signify possible Chronic Kidney Disease.    Anion gap 9 5 - 15  Glucose, capillary     Status: Abnormal   Collection Time: 02/27/15  5:50 AM  Result Value Ref Range   Glucose-Capillary 165 (H) 65 - 99 mg/dL  D-dimer, quantitative (not at [facility])     Status: Abnormal   Collection Time: 02/27/15  9:19 AM  Result Value Ref Range   D-Dimer, Quant 0.64 (H) 0.00 - 0.50 ug/mL-FEU    Comment: (NOTE) At the manufacturer cut-off of 0.50 ug/mL FEU, this assay has been documented to exclude PE with a sensitivity and negative predictive value of 97 to 99%.  At this time, this assay has not been approved by the FDA to exclude DVT/VTE. Results should be correlated with clinical presentation.      Lipid Panel     Component  Value Date/Time   CHOL 148 09/22/2014 1400   TRIG 324*  09/22/2014 1400   TRIG 98 03/03/2006 0910   HDL 23* 09/22/2014 1400   CHOLHDL 6.4 09/22/2014 1400   CHOLHDL 5.5 CALC 03/03/2006 0910   VLDL 65* 09/22/2014 1400   LDLCALC 60 09/22/2014 1400     Lab Results  Component Value Date   HGBA1C 6.0* 11/17/2013   HGBA1C 7.1* 12/11/2011   HGBA1C 6.2* 03/03/2006     Lab Results  Component Value Date   LDLCALC 60 09/22/2014   CREATININE 3.60* 02/27/2015     HPI :* Craig y.o. year-old Flynn with a background history of HTN, gout, CKD4. DM2, Diastolic CHF (LVEF 37-10%) with DD. He was admitted to the hospital with chest pain,  Presented  with acute onset of chest pain. Symptoms started this morning in the center of his chest. He notes the pain was sharp and did not move. Coughing made symptoms worse. He notes associated symptoms of neck swelling 1 week ,and productive cough with whitish sputum production for the last day and a half.  productive cough (felt probably related to acute bronchitis). He has known CKD4 and his creatinines have fluctuated as can be seen below, but for the most part have been in the mid to high 3's since 2013.   He was seen in consultation on a prior admission in July 2016 by Dr. Jonnie Finner. SPEP was unremarkable, UPEP showed no M-spike but he did have elevated urine free light chains and an elevated kappa/lambda ratio of 3.51. UPC was not done. Renal US showed 10.8, 10.6 cm kidneys with cortical thinning.   He has only been seen once at [facility] and that was about 8 or 9 years ago - has never had a followup appt (he moved away and was lost to f/u)   HOSPITAL COURSE: * Atypical chest pain:  Acute. Symptoms appear to be more related to patient's symptoms of cough. Chest pain symptoms are reproducible on palpation with physical exam. Initial EKG showing sinus rhythm with some T-wave abnormalities and  Troponin negative times 3  . Patient does not appear to be in acute heart failure as BMP was 22.  Telemetry shows  NSR  Acute bronchitis: Patient with complaints of cough with shortness of breath and wheezing. Chest x-ray negative. Symptoms improved with breathing treatments. Patient is afebrile. Denies having albuterol at home. Likely provoked from history of cigarette smoking. Although symptoms could be viral in nature. -Albuterol INH  treatments every 6 hours and prn orders of breath Prednisone , mucinex for 5 days  CT chect WO contrast No acute cardiopulmonary disease. No evidence of subcutaneous Emphysema. D-dimer slightly elevated in the setting of RF , r/o DVT   Suspected acute on chronic renal failure. Patient's baseline ranges between 2.7 and 3.57 per review of previous lab work.  Needs to re-establish outpt renal followup and agrees to see Jamal Maes, MD  in the office after discharge. ( appt for 04/03/15 at 11:30 AM)   Home lasix dose too small for his degree of CKD4. Started 80 mg BID yesterday - has only had 1 dose. UOP recorded as 1500 cc past 24 hours. Will need to   Diureses, electrolytes need to be rechecked in 3-5 days . He will purchase scales, weigh at home,     HTN (hypertension) -Continue hydralazine, Coreg  Diastolic dysfunction grade 1L Stable. echocardiogram performed 09/2014 showing EF of 55-60% and grade 1 diastolic dysfunction.   OSA (obstructive  sleep apnea): Patient scheduled to have a sleep study performed January 17  -respiratory place on CPAP overnight to help with overall breathing   Dyslipidemia -continue simvastatin  Tobacco abuse: Patient reports smoking half pack cigarettes per day  -Counseled on the need for cessation of tobacco as this likely precipitated presenting symptoms     Discharge Exam:    Blood pressure 180/90, pulse 89, temperature 97.4 F (36.3 C), temperature source Oral, resp. rate 18, height _0  (1.88 m), weight 149.052 kg (328 lb 9.6 oz), SpO2 96 %.  Gen: Very tall, large framed pleasant AAM NAD VS as noted Skin: no rash,  cyanosis Neck: no JVD Chest: Grossly clear Heart: S1S2 No S3 No murmur. Quiet precordium. Abdomen: soft, obese. Changes from prior umbilical hernia repair. No focal tenderness Ext: 2+ pitting edema RLE>LLE. Trace pitting of thighs, no pitting of abdominal wall. Scars on legs from prior blisters that were associated with edema.  Neuro: alert, Ox3, no focal deficit    Follow-up Information    Schedule an appointment as soon as possible for a visit with Mauricio Po, [place].   Specialty:  Family Medicine   Contact information:   [place] [address] [phone]       Follow up with DUNHAM,CYNTHIA B, MD. Schedule an appointment as soon as possible for a visit on 04/03/2015.   Specialty:  Nephrology   Why:  11.00 am   Contact information:   [place] [address] [phone]       Signed: Reyne Dumas 02/27/2015, 11:36 AM        Time spent >Craig mins

## 2015-02-27 NOTE — Progress Notes (Signed)
Pt refuses CPAP 

## 2015-02-27 NOTE — Progress Notes (Addendum)
Preliminary results by tech - Venous Duplex Lower Ext. Completed. No evidence of deep or superficial vein thrombosis in the right or left lower extremities.

## 2015-02-28 DIAGNOSIS — G4733 Obstructive sleep apnea (adult) (pediatric): Secondary | ICD-10-CM | POA: Diagnosis not present

## 2015-02-28 DIAGNOSIS — I129 Hypertensive chronic kidney disease with stage 1 through stage 4 chronic kidney disease, or unspecified chronic kidney disease: Secondary | ICD-10-CM | POA: Diagnosis not present

## 2015-02-28 DIAGNOSIS — J209 Acute bronchitis, unspecified: Secondary | ICD-10-CM | POA: Diagnosis not present

## 2015-02-28 DIAGNOSIS — R0789 Other chest pain: Secondary | ICD-10-CM | POA: Diagnosis not present

## 2015-02-28 DIAGNOSIS — J439 Emphysema, unspecified: Secondary | ICD-10-CM | POA: Diagnosis not present

## 2015-02-28 DIAGNOSIS — N179 Acute kidney failure, unspecified: Secondary | ICD-10-CM | POA: Diagnosis not present

## 2015-02-28 DIAGNOSIS — E785 Hyperlipidemia, unspecified: Secondary | ICD-10-CM | POA: Diagnosis not present

## 2015-02-28 DIAGNOSIS — Z87891 Personal history of nicotine dependence: Secondary | ICD-10-CM | POA: Diagnosis not present

## 2015-02-28 LAB — PARATHYROID HORMONE, INTACT (NO CA): PTH: 99 pg/mL — ABNORMAL HIGH (ref 15–65)

## 2015-02-28 LAB — RENAL FUNCTION PANEL
Albumin: 3.9 g/dL (ref 3.5–5.0)
Anion gap: 12 (ref 5–15)
BUN: 55 mg/dL — ABNORMAL HIGH (ref 6–20)
CO2: 27 mmol/L (ref 22–32)
Calcium: 9.9 mg/dL (ref 8.9–10.3)
Chloride: 96 mmol/L — ABNORMAL LOW (ref 101–111)
Creatinine, Ser: 3.7 mg/dL — ABNORMAL HIGH (ref 0.61–1.24)
GFR calc Af Amer: 20 mL/min — ABNORMAL LOW (ref >60)
GFR calc non Af Amer: 17 mL/min — ABNORMAL LOW (ref >60)
Glucose, Bld: 153 mg/dL — ABNORMAL HIGH (ref 65–99)
Phosphorus: 4.9 mg/dL — ABNORMAL HIGH (ref 2.5–4.6)
Potassium: 4.3 mmol/L (ref 3.5–5.1)
Sodium: 135 mmol/L (ref 135–145)

## 2015-02-28 LAB — GLUCOSE, CAPILLARY: Glucose-Capillary: 151 mg/dL — ABNORMAL HIGH (ref 65–99)

## 2015-02-28 NOTE — Discharge Summary (Signed)
Physician Discharge Summary  Craig Flynn MRN: [id number] DOB/AGE: July 10, 1960 54 y.o.  PCP: Mauricio Po, FNP   Admit date: 02/25/2015 Discharge date: 02/28/2015  Discharge Diagnoses:     Principal Problem:   Atypical chest pain Active Problems:   Dyslipidemia   Acute on chronic renal failure (HCC)   HTN (hypertension)   Morbid obesity (HCC)   OSA (obstructive sleep apnea)   Hyperlipidemia   Acute bronchitis   Diastolic dysfunction   Tobacco abuse    Follow-up recommendations Follow-up with PCP in 3-5 days , including all  additional recommended appointments as below Follow-up CBC, CMP in 3-5 days 1. appt for 04/03/15 at 11:30 AM with Jamal Maes, MD     Medication List    STOP taking these medications        predniSONE 10 MG (21) Tbpk tablet  Commonly known as:  STERAPRED UNI-PAK 21 TAB  Replaced by:  predniSONE 20 MG tablet      TAKE these medications        albuterol 108 (90 BASE) MCG/ACT inhaler  Commonly known as:  PROVENTIL HFA;VENTOLIN HFA  Inhale 2 puffs into the lungs every 6 (six) hours as needed for wheezing or shortness of breath.     allopurinol 300 MG tablet  Commonly known as:  ZYLOPRIM  Take 0.5 tablets (150 mg total) by mouth daily.     aspirin 325 MG tablet  Take 1 tablet (325 mg total) by mouth daily.     carvedilol 25 MG tablet  Commonly known as:  COREG  Take 1 tablet (25 mg total) by mouth 2 (two) times daily.     fenofibrate 54 MG tablet  Take 1 tablet (54 mg total) by mouth daily.     furosemide 80 MG tablet  Commonly known as:  LASIX  Take 1 tablet (80 mg total) by mouth 2 (two) times daily.     guaiFENesin 600 MG 12 hr tablet  Commonly known as:  MUCINEX  Take 2 tablets (1,200 mg total) by mouth 2 (two) times daily.     hydrALAZINE 25 MG tablet  Commonly known as:  APRESOLINE  Take 1.5 tablets (37.5 mg total) by mouth 3 (three) times daily.     HYDROcodone-acetaminophen 5-325 MG tablet  Commonly known as:   NORCO/VICODIN  Take 1-2 tablets by mouth every 6 (six) hours as needed for moderate pain or severe pain.     isosorbide mononitrate 30 MG 24 hr tablet  Commonly known as:  IMDUR  Take 1 tablet (30 mg total) by mouth daily.     oxyCODONE 5 MG immediate release tablet  Commonly known as:  Oxy IR/ROXICODONE  Take 1 tablet (5 mg total) by mouth every 6 (six) hours as needed for moderate pain or severe pain.     potassium chloride SA 20 MEQ tablet  Commonly known as:  K-DUR,KLOR-CON  Take 1 tablet (20 mEq total) by mouth 2 (two) times daily.     predniSONE 20 MG tablet  Commonly known as:  DELTASONE  Take 1 tablet (20 mg total) by mouth daily with breakfast.     tiZANidine 4 MG tablet  Commonly known as:  ZANAFLEX  Take 1 tablet (4 mg total) by mouth every 8 (eight) hours as needed.         Discharge Condition:  Stable  Discharge Instructions   Discharge Instructions    Diet - low sodium heart healthy    Complete by:  As directed  Diet - low sodium heart healthy    Complete by:  As directed      Increase activity slowly    Complete by:  As directed      Increase activity slowly    Complete by:  As directed            No Known Allergies    Disposition: 07-Left Against Medical Advice   Consults:  nephrology    Significant Diagnostic Studies:  Dg Chest 2 View  02/25/2015  CLINICAL DATA:  Initial encounter for mid sharp chest pain starting today with productive cough and white sputum. EXAM: CHEST  2 VIEW COMPARISON:  10/30/2014. FINDINGS: The lungs are clear wiithout focal pneumonia, edema, pneumothorax or pleural effusion. The cardiopericardial silhouette is within normal limits for size. Cardiopericardial silhouette is at upper limits of normal for size. Imaged bony structures of the thorax are intact. IMPRESSION: Stable.  No acute cardiopulmonary findings. Electronically Signed   By: Misty Stanley M.D.   On: 02/25/2015 17:07   Ct Chest Wo  Contrast  02/27/2015  CLINICAL DATA:  Subcutaneous emphysema. EXAM: CT CHEST WITHOUT CONTRAST TECHNIQUE: Multidetector CT imaging of the chest was performed following the standard protocol without IV contrast. COMPARISON:  Chest x-ray 02/25/2015 FINDINGS: Lungs are well inflated without consolidation or effusion. Airways are normal. Heart is normal in size. There is mild calcified plaque over the left main and 3 vessel coronary arteries. There is no mediastinal, hilar or axillary adenopathy. There is mild calcified plaque over the thoracic aorta. Remaining mediastinal structures are within normal. 5 mm hypodensity over the left lobe of the thyroid. Tiny focus of air over the right sternoclavicular joint without adjacent fluid or inflammatory change. No evidence of subcutaneous emphysema. Images through the upper abdomen demonstrate evidence of a previous cholecystectomy. Minimal calcified plaque over the abdominal aorta. Minimal degenerate change of the spine. IMPRESSION: No acute cardiopulmonary disease. No evidence of subcutaneous emphysema. Mild 3 vessel atherosclerotic coronary artery disease. 5 mm hypodense left thyroid nodule. Electronically Signed   By: Marin Olp M.D.   On: 02/27/2015 08:02        Filed Weights   02/26/15 0500 02/27/15 0434 02/28/15 0352  Weight: 151.048 kg (333 lb) 149.052 kg (328 lb 9.6 oz) 147.7 kg (325 lb 9.9 oz)     Microbiology: No results found for this or any previous visit (from the past 240 hour(s)).     Blood Culture    Component Value Date/Time   SDES BLOOD LEFT HAND 11/17/2013 0840   SPECREQUEST BOTTLES DRAWN AEROBIC ONLY 4CC 11/17/2013 0840   CULT  11/17/2013 0840    NO GROWTH 5 DAYS Performed at [facility] 11/23/2013 FINAL 11/17/2013 0840      Labs: Results for orders placed or performed during the hospital encounter of 02/25/15 (from the past 48 hour(s))  Troponin I (q 6hr x 3)     Status: None   Collection Time:  02/26/15 11:54 AM  Result Value Ref Range   Troponin I 0.03 <0.031 ng/mL    Comment:        NO INDICATION OF MYOCARDIAL INJURY.   Urinalysis, Routine w reflex microscopic (not at [facility])     Status: Abnormal   Collection Time: 02/26/15  7:39 PM  Result Value Ref Range   Color, Urine YELLOW YELLOW   APPearance CLEAR CLEAR   Specific Gravity, Urine 1.016 1.005 - 1.030   pH 5.0 5.0 - 8.0   Glucose,  UA NEGATIVE NEGATIVE mg/dL   Hgb urine dipstick NEGATIVE NEGATIVE   Bilirubin Urine NEGATIVE NEGATIVE   Ketones, ur NEGATIVE NEGATIVE mg/dL   Protein, ur 30 (A) NEGATIVE mg/dL   Nitrite NEGATIVE NEGATIVE   Leukocytes, UA NEGATIVE NEGATIVE  Protein / creatinine ratio, urine     Status: Abnormal   Collection Time: 02/26/15  7:39 PM  Result Value Ref Range   Creatinine, Urine 177.14 mg/dL   Total Protein, Urine 31 mg/dL    Comment: NO NORMAL RANGE ESTABLISHED FOR THIS TEST   Protein Creatinine Ratio 0.18 (H) 0.00 - 0.15 mg/mg[Cre]  Urine microscopic-add on     Status: Abnormal   Collection Time: 02/26/15  7:39 PM  Result Value Ref Range   Squamous Epithelial / LPF 0-5 (A) NONE SEEN   WBC, UA 0-5 0 - 5 WBC/hpf   RBC / HPF 0-5 0 - 5 RBC/hpf   Bacteria, UA RARE (A) NONE SEEN  CBC     Status: Abnormal   Collection Time: 02/27/15  2:30 AM  Result Value Ref Range   WBC 5.6 4.0 - 10.5 K/uL   RBC 4.54 4.22 - 5.81 MIL/uL   Hemoglobin 12.0 (L) 13.0 - 17.0 g/dL   HCT 39.0 39.0 - 52.0 %   MCV 85.9 78.0 - 100.0 fL   MCH 26.4 26.0 - 34.0 pg   MCHC 30.8 30.0 - 36.0 g/dL   RDW 16.0 (H) 11.5 - 15.5 %   Platelets 184 150 - 400 K/uL  Renal function panel     Status: Abnormal   Collection Time: 02/27/15  2:30 AM  Result Value Ref Range   Sodium 137 135 - 145 mmol/L   Potassium 4.4 3.5 - 5.1 mmol/L   Chloride 100 (L) 101 - 111 mmol/L   CO2 28 22 - 32 mmol/L   Glucose, Bld 161 (H) 65 - 99 mg/dL   BUN 43 (H) 6 - 20 mg/dL   Creatinine, Ser 3.60 (H) 0.61 - 1.24 mg/dL   Calcium 9.6 8.9 - 10.3 mg/dL    Phosphorus 2.7 2.5 - 4.6 mg/dL   Albumin 3.4 (L) 3.5 - 5.0 g/dL   GFR calc non Af Amer 18 (L) >60 mL/min   GFR calc Af Amer 21 (L) >60 mL/min    Comment: (NOTE) The eGFR has been calculated using the CKD EPI equation. This calculation has not been validated in all clinical situations. eGFR's persistently <60 mL/min signify possible Chronic Kidney Disease.    Anion gap 9 5 - 15  Glucose, capillary     Status: Abnormal   Collection Time: 02/27/15  5:50 AM  Result Value Ref Range   Glucose-Capillary 165 (H) 65 - 99 mg/dL  D-dimer, quantitative (not at [facility])     Status: Abnormal   Collection Time: 02/27/15  9:19 AM  Result Value Ref Range   D-Dimer, Quant 0.64 (H) 0.00 - 0.50 ug/mL-FEU    Comment: (NOTE) At the manufacturer cut-off of 0.50 ug/mL FEU, this assay has been documented to exclude PE with a sensitivity and negative predictive value of 97 to 99%.  At this time, this assay has not been approved by the FDA to exclude DVT/VTE. Results should be correlated with clinical presentation.   Renal function panel     Status: Abnormal   Collection Time: 02/28/15  5:02 AM  Result Value Ref Range   Sodium 135 135 - 145 mmol/L   Potassium 4.3 3.5 - 5.1 mmol/L   Chloride 96 (L) 101 -  111 mmol/L   CO2 27 22 - 32 mmol/L   Glucose, Bld 153 (H) 65 - 99 mg/dL   BUN 55 (H) 6 - 20 mg/dL   Creatinine, Ser 3.70 (H) 0.61 - 1.24 mg/dL   Calcium 9.9 8.9 - 10.3 mg/dL   Phosphorus 4.9 (H) 2.5 - 4.6 mg/dL   Albumin 3.9 3.5 - 5.0 g/dL   GFR calc non Af Amer 17 (L) >60 mL/min   GFR calc Af Amer 20 (L) >60 mL/min    Comment: (NOTE) The eGFR has been calculated using the CKD EPI equation. This calculation has not been validated in all clinical situations. eGFR's persistently <60 mL/min signify possible Chronic Kidney Disease.    Anion gap 12 5 - 15  Glucose, capillary     Status: Abnormal   Collection Time: 02/28/15  6:14 AM  Result Value Ref Range   Glucose-Capillary 151 (H) 65 - 99 mg/dL      Lipid Panel     Component Value Date/Time   CHOL 148 09/22/2014 1400   TRIG 324* 09/22/2014 1400   TRIG 98 03/03/2006 0910   HDL 23* 09/22/2014 1400   CHOLHDL 6.4 09/22/2014 1400   CHOLHDL 5.5 CALC 03/03/2006 0910   VLDL 65* 09/22/2014 1400   LDLCALC 60 09/22/2014 1400     Lab Results  Component Value Date   HGBA1C 6.0* 11/17/2013   HGBA1C 7.1* 12/11/2011   HGBA1C 6.2* 03/03/2006     Lab Results  Component Value Date   LDLCALC 60 09/22/2014   CREATININE 3.70* 02/28/2015     HPI :* 54 y.o. year-old male with a background history of HTN, gout, CKD4. DM2, Diastolic CHF (LVEF 39-03%) with DD. He was admitted to the hospital with chest pain,  Presented  with acute onset of chest pain. Symptoms started this morning in the center of his chest. He notes the pain was sharp and did not move. Coughing made symptoms worse. He notes associated symptoms of neck swelling 1 week ,and productive cough with whitish sputum production for the last day and a half.  productive cough (felt probably related to acute bronchitis). He has known CKD4 and his creatinines have fluctuated as can be seen below, but for the most part have been in the mid to high 3's since 2013.   He was seen in consultation on a prior admission in July 2016 by Dr. Jonnie Finner. SPEP was unremarkable, UPEP showed no M-spike but he did have elevated urine free light chains and an elevated kappa/lambda ratio of 3.51. UPC was not done. Renal US showed 10.8, 10.6 cm kidneys with cortical thinning.   He has only been seen once at [facility] and that was about 8 or 9 years ago - has never had a followup appt (he moved away and was lost to f/u)   HOSPITAL COURSE: * Atypical chest pain:  Acute. Symptoms appear to be more related to patient's symptoms of cough. Chest pain symptoms are reproducible on palpation with physical exam. Initial EKG showing sinus rhythm with some T-wave abnormalities and  Troponin negative times 3   . Patient does not appear to be in acute heart failure as BMP was 22.  Telemetry shows NSR  Acute bronchitis: Patient with complaints of cough with shortness of breath and wheezing. Chest x-ray negative. Symptoms improved with breathing treatments. Patient is afebrile. Denies having albuterol at home. Likely provoked from history of cigarette smoking. Although symptoms could be viral in nature. -Albuterol INH  treatments every  6 hours and prn orders of breath Prednisone , mucinex for 5 days  CT chect WO contrast No acute cardiopulmonary disease. No evidence of subcutaneous Emphysema. D-dimer slightly elevated in the setting of RF , r/o DVT   Suspected acute on chronic renal failure. Patient's baseline ranges between 2.7 and 3.57 per review of previous lab work.  Needs to re-establish outpt renal followup and agrees to see Jamal Maes, MD  in the office after discharge. ( appt for 04/03/15 at 11:30 AM)   Home lasix dose too small for his degree of CKD4. Started 80 mg BID yesterday - has only had 1 dose. UOP recorded as 1500 cc past 24 hours. Will need to   Diureses, electrolytes need to be rechecked in 3-5 days . He will purchase scales, weigh at home,     HTN (hypertension) -Continue hydralazine, Coreg  Diastolic dysfunction grade 1L Stable. echocardiogram performed 09/2014 showing EF of 55-60% and grade 1 diastolic dysfunction.   OSA (obstructive sleep apnea): Patient scheduled to have a sleep study performed January 17  -respiratory place on CPAP overnight to help with overall breathing   Dyslipidemia -continue simvastatin  Tobacco abuse: Patient reports smoking half pack cigarettes per day  -Counseled on the need for cessation of tobacco as this likely precipitated presenting symptoms     Discharge Exam:    Blood pressure 157/84, pulse 87, temperature 98 F (36.7 C), temperature source Oral, resp. rate 18, height 6' 2"  (1.88 m), weight 147.7 kg (325 lb 9.9 oz), SpO2 98  %.  Gen: Very tall, large framed pleasant AAM NAD VS as noted Skin: no rash, cyanosis Neck: no JVD Chest: Grossly clear Heart: S1S2 No S3 No murmur. Quiet precordium. Abdomen: soft, obese. Changes from prior umbilical hernia repair. No focal tenderness Ext: 2+ pitting edema RLE>LLE. Trace pitting of thighs, no pitting of abdominal wall. Scars on legs from prior blisters that were associated with edema.  Neuro: alert, Ox3, no focal deficit    Follow-up Information    Follow up with Mauricio Po, FNP. Schedule an appointment as soon as possible for a visit on 03/06/2015.   Specialty:  Family Medicine   Why:  pt app is on 03/06/15 @ 2:00   Contact information:   [place] [address] [phone]       Follow up with DUNHAM,CYNTHIA B, MD. Schedule an appointment as soon as possible for a visit on 04/03/2015.   Specialty:  Nephrology   Why:  11.00 am   Contact information:   [place] [address] [phone]       Signed: Reyne Dumas 02/28/2015, 9:00 AM        Time spent >45 mins

## 2015-02-28 NOTE — Progress Notes (Signed)
Pt discharged to home at 1015. Pt stable at discharge. He denied any chest pain. Noted to have non productive cough at intervals. Pt to continue prednisone at home. I reviewed discharge instructions with pt. He verbalized understanding. PIV and telemetry discontinued.

## 2015-03-06 ENCOUNTER — Encounter: Payer: Self-pay | Admitting: Family

## 2015-03-06 ENCOUNTER — Other Ambulatory Visit (INDEPENDENT_AMBULATORY_CARE_PROVIDER_SITE_OTHER): Payer: BLUE CROSS/BLUE SHIELD

## 2015-03-06 ENCOUNTER — Ambulatory Visit (INDEPENDENT_AMBULATORY_CARE_PROVIDER_SITE_OTHER): Payer: BLUE CROSS/BLUE SHIELD | Admitting: Family

## 2015-03-06 VITALS — BP 140/76 | HR 89 | Temp 97.6°F | Resp 18 | Ht 74.0 in | Wt 325.4 lb

## 2015-03-06 DIAGNOSIS — N184 Chronic kidney disease, stage 4 (severe): Secondary | ICD-10-CM

## 2015-03-06 DIAGNOSIS — J209 Acute bronchitis, unspecified: Secondary | ICD-10-CM | POA: Diagnosis not present

## 2015-03-06 LAB — CBC
HCT: 41.8 % (ref 39.0–52.0)
Hemoglobin: 13.4 g/dL (ref 13.0–17.0)
MCHC: 32.2 g/dL (ref 30.0–36.0)
MCV: 82 fl (ref 78.0–100.0)
Platelets: 277 10*3/uL (ref 150.0–400.0)
RBC: 5.1 Mil/uL (ref 4.22–5.81)
RDW: 16.8 % — ABNORMAL HIGH (ref 11.5–15.5)
WBC: 16.9 10*3/uL — ABNORMAL HIGH (ref 4.0–10.5)

## 2015-03-06 LAB — COMPREHENSIVE METABOLIC PANEL
ALT: 20 U/L (ref 0–53)
AST: 11 U/L (ref 0–37)
Albumin: 3.8 g/dL (ref 3.5–5.2)
Alkaline Phosphatase: 43 U/L (ref 39–117)
BUN: 64 mg/dL — ABNORMAL HIGH (ref 6–23)
CO2: 26 mEq/L (ref 19–32)
Calcium: 9.2 mg/dL (ref 8.4–10.5)
Chloride: 101 mEq/L (ref 96–112)
Creatinine, Ser: 3.44 mg/dL — ABNORMAL HIGH (ref 0.40–1.50)
GFR: 24.06 mL/min — ABNORMAL LOW (ref >60.00)
Glucose, Bld: 109 mg/dL — ABNORMAL HIGH (ref 70–99)
Potassium: 3.9 mEq/L (ref 3.5–5.1)
Sodium: 138 mEq/L (ref 135–145)
Total Bilirubin: 0.4 mg/dL (ref 0.2–1.2)
Total Protein: 6.9 g/dL (ref 6.0–8.3)

## 2015-03-06 MED ORDER — BENZONATATE 100 MG PO CAPS: 100.0000 mg | ORAL_CAPSULE | Freq: Two times a day (BID) | ORAL | Status: DC | PRN

## 2015-03-06 MED ORDER — ALLOPURINOL 300 MG PO TABS: 300.0000 mg | ORAL_TABLET | Freq: Every day | ORAL | Status: DC

## 2015-03-06 MED ORDER — AZITHROMYCIN 250 MG PO TABS: ORAL_TABLET | ORAL | Status: DC

## 2015-03-06 NOTE — Progress Notes (Signed)
Pre visit review using our clinic review tool, if applicable. No additional management support is needed unless otherwise documented below in the visit note. 

## 2015-03-06 NOTE — Patient Instructions (Addendum)
Thank you for choosing [facility].  Summary/Instructions:  Your prescription(s) have been submitted to your pharmacy or been printed and provided for you. Please take as directed and contact our office if you believe you are having problem(s) with the medication(s) or have any questions.  Please stop by the lab on the basement level of the building for your blood work. Your results will be released to [facility] (or called to you) after review, usually within 72 hours after test completion. If any changes need to be made, you will be notified at that same time.  If your symptoms worsen or fail to improve, please contact our office for further instruction, or in case of emergency go directly to the emergency room at the closest medical facility.   Acute Bronchitis Bronchitis is inflammation of the airways that extend from the windpipe into the lungs (bronchi). The inflammation often causes mucus to develop. This leads to a cough, which is the most common symptom of bronchitis.  In acute bronchitis, the condition usually develops suddenly and goes away over time, usually in a couple weeks. Smoking, allergies, and asthma can make bronchitis worse. Repeated episodes of bronchitis may cause further lung problems.  CAUSES Acute bronchitis is most often caused by the same virus that causes a cold. The virus can spread from person to person (contagious) through coughing, sneezing, and touching contaminated objects. SIGNS AND SYMPTOMS   Cough.   Fever.   Coughing up mucus.   Body aches.   Chest congestion.   Chills.   Shortness of breath.   Sore throat.  DIAGNOSIS  Acute bronchitis is usually diagnosed through a physical exam. Your health care provider will also ask you questions about your medical history. Tests, such as chest X-rays, are sometimes done to rule out other conditions.  TREATMENT  Acute bronchitis usually goes away in a couple weeks. Oftentimes, no medical  treatment is necessary. Medicines are sometimes given for relief of fever or cough. Antibiotic medicines are usually not needed but may be prescribed in certain situations. In some cases, an inhaler may be recommended to help reduce shortness of breath and control the cough. A cool mist vaporizer may also be used to help thin bronchial secretions and make it easier to clear the chest.  HOME CARE INSTRUCTIONS  Get plenty of rest.   Drink enough fluids to keep your urine clear or pale yellow (unless you have a medical condition that requires fluid restriction). Increasing fluids may help thin your respiratory secretions (sputum) and reduce chest congestion, and it will prevent dehydration.   Take medicines only as directed by your health care provider.  If you were prescribed an antibiotic medicine, finish it all even if you start to feel better.  Avoid smoking and secondhand smoke. Exposure to cigarette smoke or irritating chemicals will make bronchitis worse. If you are a smoker, consider using nicotine gum or skin patches to help control withdrawal symptoms. Quitting smoking will help your lungs heal faster.   Reduce the chances of another bout of acute bronchitis by washing your hands frequently, avoiding people with cold symptoms, and trying not to touch your hands to your mouth, nose, or eyes.   Keep all follow-up visits as directed by your health care provider.  SEEK MEDICAL CARE IF: Your symptoms do not improve after 1 week of treatment.  SEEK IMMEDIATE MEDICAL CARE IF:  You develop an increased fever or chills.   You have chest pain.   You have severe shortness  of breath.  You have bloody sputum.   You develop dehydration.  You faint or repeatedly feel like you are going to pass out.  You develop repeated vomiting.  You develop a severe headache. MAKE SURE YOU:   Understand these instructions.  Will watch your condition.  Will get help right away if you are not  doing well or get worse.   This information is not intended to replace advice given to you by your health care provider. Make sure you discuss any questions you have with your health care provider.   Document Released: 04/16/2004 Document Revised: 03/30/2014 Document Reviewed: 08/30/2012 Elsevier Interactive Patient Education [employer].

## 2015-03-06 NOTE — Assessment & Plan Note (Addendum)
Symptoms and exam consistent with resolving bronchitis. Obtain CBC and complete metabolic panel. Start Tessalon as needed for cough. Start Breo to help with wheezing and cough. Continue over-the-counter medications as needed for symptom relief and supportive care. Written prescription for azithromycin provided if symptoms worsen. Follow-up if symptoms worsen or fail to improve.

## 2015-03-06 NOTE — Assessment & Plan Note (Signed)
Currently stable with current regimen and denies adverse side affects of medication or excessive edema. Follow-up with nephrology as scheduled.

## 2015-03-06 NOTE — Progress Notes (Signed)
Subjective:    Patient ID: Craig Flynn, male    DOB: Jan 25, 1961, 54 y.o.   MRN: [id number]  Chief Complaint  Patient presents with  . Hospitalization Follow-up    states that he is doing better, has a cough was not given anything for congestion and would like something    HPI:  Craig Flynn is a 54 y.o. male who  has a past medical history of Hypertension; Gout; Other specified disorder of intestines; CHF (congestive heart failure) ([place]); and Shortness of breath dyspnea. and presents today for a follow up after hospitalization.   Recently evaluated in the emergency room and admitted to the hospital with a chief complaint of cough and chest pain. He was also noted to have worsening shortness of breath and chronic leg swelling. ED EKG revealed normal sinus rhythm with T-wave abnormalities. Troponins were negative. There was concern for acute on chronic renal failure. Diagnosed with atypical chest pain most likely related to symptoms of cough and treated for acute bronchitis with prednisone and Mucinex. CT of the chest revealed no acute cardiopulmonary disease or evidence of subcutaneous emphysema. Diagnosed with acute on chronic renal failure and CK D stage IV. He was discharged with instructions to follow up with nephrology. All hospital records were reviewed in detail.  Since leaving the hospital he has been improved with no chest pain or discomfort with the exception of when he coughs. Has tried the OTC Mucinex as instructed which has not helped very much. Severity of the cough can disturb his sleep but he is able to go back to sleep. Continues to experience some congestion and wheezing.  He does have follow-up with nephrology scheduled.  No Known Allergies   Current Outpatient Prescriptions on File Prior to Visit  Medication Sig Dispense Refill  . albuterol (PROVENTIL HFA;VENTOLIN HFA) 108 (90 BASE) MCG/ACT inhaler Inhale 2 puffs into the lungs every 6 (six) hours as needed for  wheezing or shortness of breath. 1 Inhaler 2  . aspirin 325 MG tablet Take 1 tablet (325 mg total) by mouth daily. 30 tablet 3  . carvedilol (COREG) 25 MG tablet Take 1 tablet (25 mg total) by mouth 2 (two) times daily. 60 tablet 6  . fenofibrate 54 MG tablet Take 1 tablet (54 mg total) by mouth daily. 30 tablet 3  . furosemide (LASIX) 80 MG tablet Take 1 tablet (80 mg total) by mouth 2 (two) times daily. 60 tablet 1  . guaiFENesin (MUCINEX) 600 MG 12 hr tablet Take 2 tablets (1,200 mg total) by mouth 2 (two) times daily. 20 tablet 0  . hydrALAZINE (APRESOLINE) 25 MG tablet Take 1.5 tablets (37.5 mg total) by mouth 3 (three) times daily. 120 tablet 1  . HYDROcodone-acetaminophen (NORCO/VICODIN) 5-325 MG tablet Take 1-2 tablets by mouth every 6 (six) hours as needed for moderate pain or severe pain. 7 tablet 0  . isosorbide mononitrate (IMDUR) 30 MG 24 hr tablet Take 1 tablet (30 mg total) by mouth daily. 30 tablet 3  . oxyCODONE (OXY IR/ROXICODONE) 5 MG immediate release tablet Take 1 tablet (5 mg total) by mouth every 6 (six) hours as needed for moderate pain or severe pain. 30 tablet 0  . potassium chloride SA (K-DUR,KLOR-CON) 20 MEQ tablet Take 1 tablet (20 mEq total) by mouth 2 (two) times daily. 60 tablet 3  . predniSONE (DELTASONE) 20 MG tablet Take 1 tablet (20 mg total) by mouth daily with breakfast. 10 tablet 0  . tiZANidine (ZANAFLEX) 4  MG tablet Take 1 tablet (4 mg total) by mouth every 8 (eight) hours as needed. 30 tablet 0   No current facility-administered medications on file prior to visit.    Past Medical History  Diagnosis Date  . Hypertension   . Gout   . 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 CHF (congestive heart failure) ([place])   . Shortness of breath dyspnea     Review of Systems  Constitutional: Negative for fever and chills.  HENT: Negative for congestion, sinus pressure and sore throat.     Respiratory: Positive for cough. Negative for chest tightness and shortness of breath.   Neurological: Negative for headaches.      Objective:    BP 140/76 mmHg  Pulse 89  Temp(Src) 97.6 F (36.4 C) (Oral)  Resp 18  Ht 6\' 2"  (1.88 m)  Wt 325 lb 6.4 oz (147.6 kg)  BMI 41.76 kg/m2  SpO2 98% Nursing note and vital signs reviewed.  Physical Exam  Constitutional: He is oriented to person, place, and time. He appears well-developed and well-nourished. No distress.  HENT:  Right Ear: Hearing, tympanic membrane, external ear and ear canal normal.  Left Ear: Hearing, tympanic membrane, external ear and ear canal normal.  Nose: Nose normal. Right sinus exhibits no maxillary sinus tenderness and no frontal sinus tenderness. Left sinus exhibits no maxillary sinus tenderness and no frontal sinus tenderness.  Mouth/Throat: Uvula is midline, oropharynx is clear and moist and mucous membranes are normal.  Cardiovascular: Normal rate, regular rhythm, normal heart sounds and intact distal pulses.   Pulmonary/Chest: Effort normal. No respiratory distress. He has wheezes. He has no rales. He exhibits no tenderness.  Neurological: He is alert and oriented to person, place, and time.  Skin: Skin is warm and dry.  Psychiatric: He has a normal mood and affect. His behavior is normal. Judgment and thought content normal.       Assessment & Plan:   Problem List Items Addressed This Visit      Respiratory   Acute bronchitis - Primary    Symptoms and exam consistent with resolving bronchitis. Obtain CBC and complete metabolic panel. Start Tessalon as needed for cough. Start Breo to help with wheezing and cough. Continue over-the-counter medications as needed for symptom relief and supportive care. Written prescription for azithromycin provided if symptoms worsen. Follow-up if symptoms worsen or fail to improve.      Relevant Orders   Comprehensive metabolic panel (Completed)   CBC (Completed)      Genitourinary   CKD (chronic kidney disease) stage 4, GFR 15-29 ml/min (HCC)    Currently stable with current regimen and denies adverse side affects of medication or excessive edema. Follow-up with nephrology as scheduled.

## 2015-04-02 ENCOUNTER — Telehealth: Payer: Self-pay | Admitting: Family

## 2015-04-02 MED ORDER — PREDNISONE 5 MG (21) PO TBPK: ORAL_TABLET | ORAL | Status: DC

## 2015-04-02 NOTE — Telephone Encounter (Signed)
Prednisone taper sent to pharmacy. 

## 2015-04-02 NOTE — Telephone Encounter (Signed)
[facility]  Patient Name: Craig Flynn  DOB: [birth year]    Initial Comment Caller states has bad arthritis in his knee, dr usually calls him in Prednisone, asking if dr will call itin   Nurse Assessment  Nurse: Wynetta Emery, RN, Baker Janus Date/Time Eilene Ghazi Time): 04/02/2015 10:28:35 AM  Confirm and document reason for call. If symptomatic, describe symptoms. 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 ---No  Does the patient have any new or worsening symptoms? ---Yes  Will a triage be completed? ---Yes  Related visit to physician within the last 2 weeks? ---No  Does the PT have any chronic conditions? (i.e. diabetes, asthma, etc.) ---Unknown  Is this a behavioral health or substance abuse call? ---No     Guidelines    Guideline Title Affirmed Question Affirmed Notes  Knee Swelling [1] MODERATE pain (e.g., interferes with normal activities, limping) AND [2] present > 3 days    Final Disposition User   See PCP When Office is Open (within 3 days) Wynetta Emery, Therapist, sports, Baker Janus    Comments  NOTEZenia Flynn would like prednisone dose pak called in for his arthritis of right knee. States after taking it, if not any better will come in and see MD but does ont want to go out and wants to get it under control.   Disagree/Comply: Comply

## 2015-04-03 DIAGNOSIS — N184 Chronic kidney disease, stage 4 (severe): Secondary | ICD-10-CM | POA: Diagnosis not present

## 2015-04-03 DIAGNOSIS — Z720 Tobacco use: Secondary | ICD-10-CM | POA: Diagnosis not present

## 2015-04-03 DIAGNOSIS — I519 Heart disease, unspecified: Secondary | ICD-10-CM | POA: Diagnosis not present

## 2015-04-03 DIAGNOSIS — I10 Essential (primary) hypertension: Secondary | ICD-10-CM | POA: Diagnosis not present

## 2015-04-03 DIAGNOSIS — N2581 Secondary hyperparathyroidism of renal origin: Secondary | ICD-10-CM | POA: Diagnosis not present

## 2015-04-03 DIAGNOSIS — G4733 Obstructive sleep apnea (adult) (pediatric): Secondary | ICD-10-CM | POA: Diagnosis not present

## 2015-04-03 DIAGNOSIS — I129 Hypertensive chronic kidney disease with stage 1 through stage 4 chronic kidney disease, or unspecified chronic kidney disease: Secondary | ICD-10-CM | POA: Diagnosis not present

## 2015-04-03 DIAGNOSIS — I509 Heart failure, unspecified: Secondary | ICD-10-CM | POA: Diagnosis not present

## 2015-04-04 NOTE — Telephone Encounter (Signed)
Tried calling pt to let him know. Phone was not going through.

## 2015-04-11 ENCOUNTER — Ambulatory Visit (HOSPITAL_BASED_OUTPATIENT_CLINIC_OR_DEPARTMENT_OTHER): Payer: BLUE CROSS/BLUE SHIELD | Attending: Pulmonary Disease | Admitting: Sleep Medicine

## 2015-04-11 DIAGNOSIS — R400 Somnolence: Secondary | ICD-10-CM | POA: Diagnosis not present

## 2015-04-11 DIAGNOSIS — R0683 Snoring: Secondary | ICD-10-CM | POA: Insufficient documentation

## 2015-04-11 DIAGNOSIS — I493 Ventricular premature depolarization: Secondary | ICD-10-CM | POA: Diagnosis not present

## 2015-04-11 DIAGNOSIS — G4733 Obstructive sleep apnea (adult) (pediatric): Secondary | ICD-10-CM | POA: Diagnosis not present

## 2015-04-12 ENCOUNTER — Telehealth: Payer: Self-pay | Admitting: *Deleted

## 2015-04-12 MED ORDER — PREDNISONE 5 MG (21) PO TBPK: ORAL_TABLET | ORAL | Status: DC

## 2015-04-12 NOTE — Telephone Encounter (Signed)
Received call pt states he is needing some prednisone sent to relieve this athritis flare up. Verify if he pick up rx that was sent on 1/10. Pt states no one call to  Let him know that the rx was sent in. Inform will resend to [facility] aid...Johny Chess

## 2015-04-17 ENCOUNTER — Telehealth: Payer: Self-pay | Admitting: Pulmonary Disease

## 2015-04-17 DIAGNOSIS — G4733 Obstructive sleep apnea (adult) (pediatric): Secondary | ICD-10-CM | POA: Diagnosis not present

## 2015-04-17 NOTE — Progress Notes (Signed)
Patient Name: Elgie, Maziarz Date: 04/11/2015 Gender: Male D.O.B: 1960/08/16 Age (years): 87 Referring Provider: Kara Mead MD, ABSM Height (inches): 74 Interpreting Physician: Kara Mead MD, ABSM Weight (lbs): 310 RPSGT: Peak, Robert BMI: 40 MRN: [id number] Neck Size: 18.50   CLINICAL INFORMATION The patient is referred for a split night study Due to excessive daytime somnolence, snoring and cardiac comorbidities   MEDICATIONS Medications administered by patient during sleep study : No sleep medicine administered.   SLEEP STUDY TECHNIQUE As per the AASM Manual for the Scoring of Sleep and Associated Events v2.3 (April 2016) with a hypopnea requiring 4% desaturations. The channels recorded and monitored were frontal, central and occipital EEG, electrooculogram (EOG), submentalis EMG (chin), nasal and oral airflow, thoracic and abdominal wall motion, anterior tibialis EMG, snore microphone, electrocardiogram, and pulse oximetry. Bi-level positive airway pressure (BiPAP) was initiated when the patient met split night criteria and was titrated according to treat sleep-disordered breathing.   RESPIRATORY PARAMETERS Diagnostic Total AHI (/hr): 94.8 RDI (/hr): 94.8 OA Index (/hr): 3 CA Index (/hr): 0.0 REM AHI (/hr): N/A NREM AHI (/hr): 94.8 Supine AHI (/hr): N/A Non-supine AHI (/hr): 94.79 Min O2 Sat (%): 53.00 Mean O2 (%): 93.65 Time below 88% (min): 10.9   Titration Optimal IPAP Pressure (cm): 16 Optimal EPAP Pressure (cm): 12 AHI at Optimal Pressure (/hr): 0.0 Min O2 at Optimal Pressure (%): 93.0 Sleep % at Optimal (%): 43 Supine % at Optimal (%): 80     SLEEP ARCHITECTURE The study was initiated at 9:35:52 PM and terminated at 3:31:16 AM. The total recorded time was 355.4 minutes. EEG confirmed total sleep time was 290.0 minutes yielding a sleep efficiency of 81.6%. Sleep onset after lights out was 2.9 minutes with a REM latency of 213.0 minutes. The patient spent 26.21% of  the night in stage N1 sleep, 61.55% in stage N2 sleep, 0.34% in stage N3 and 11.90% in REM. Wake after sleep onset (WASO) was 62.5 minutes. The Arousal Index was 42.0/hour.   LEG MOVEMENT DATA The total Periodic Limb Movements of Sleep (PLMS) were 0. The PLMS index was 0.00 .   CARDIAC DATA The 2 lead EKG demonstrated sinus rhythm. The mean heart rate was 78.47 beats per minute. Other EKG findings include: PVCs.   IMPRESSIONS - Severe obstructive sleep apnea occurred during the diagnostic portion of the study (AHI = 94.8 /hour). An optimal PAP pressure was selected for this patient ( 16 /12 cm of water).Events were not fully controlled on CPAP 15 cm. BiPAP was applied due to the high pressure required - No significant central sleep apnea occurred during the diagnostic portion of the study (CAI = 0.0/hour). - Mild oxygen desaturation was noted during the diagnostic portion of the study (Min O2 = 53.00%). - The patient snored with Loud snoring volume during the diagnostic portion of the study. - EKG findings include PVCs. - Clinically significant periodic limb movements of sleep did not occur during the study.   DIAGNOSIS - Obstructive Sleep Apnea (327.23 [G47.33 ICD-10])   RECOMMENDATIONS - Trial of BiPAP therapy on 16/12 cm H2O with a Medium size Fisher[employer]Paykel Full Face Mask Simplus mask and heated humidification. A trial of auto CPAP could be undertaken alternatively to see if he would tolerate pressures - Avoid alcohol, sedatives and other CNS depressants that may worsen sleep apnea and disrupt normal sleep architecture. - Sleep hygiene should be reviewed to assess factors that may improve sleep quality. - Weight management and regular exercise should be initiated  or continued. - Return to Sleep Center for re-evaluation.  Kara Mead MD. [facility] Flood. [facility]

## 2015-04-17 NOTE — Telephone Encounter (Signed)
Split night study- he had severe OSA 95/h  and CPAP was required Send prescription for Auto CPAP 5-18 cm, with a Medium size Fisher[employer]Paykel Full Face Mask Simplus mask and heated humidification. Download in 4 weeks  Office visit in 6

## 2015-04-18 NOTE — Telephone Encounter (Signed)
I spoke with patient about results and he verbalized understanding and had no questions appt scheduled with TP.

## 2015-04-25 DIAGNOSIS — G4733 Obstructive sleep apnea (adult) (pediatric): Secondary | ICD-10-CM | POA: Diagnosis not present

## 2015-04-29 DIAGNOSIS — N2581 Secondary hyperparathyroidism of renal origin: Secondary | ICD-10-CM | POA: Diagnosis not present

## 2015-04-29 DIAGNOSIS — I129 Hypertensive chronic kidney disease with stage 1 through stage 4 chronic kidney disease, or unspecified chronic kidney disease: Secondary | ICD-10-CM | POA: Diagnosis not present

## 2015-04-29 DIAGNOSIS — I519 Heart disease, unspecified: Secondary | ICD-10-CM | POA: Diagnosis not present

## 2015-04-29 DIAGNOSIS — D649 Anemia, unspecified: Secondary | ICD-10-CM | POA: Diagnosis not present

## 2015-04-29 DIAGNOSIS — I10 Essential (primary) hypertension: Secondary | ICD-10-CM | POA: Diagnosis not present

## 2015-04-29 DIAGNOSIS — N184 Chronic kidney disease, stage 4 (severe): Secondary | ICD-10-CM | POA: Diagnosis not present

## 2015-04-29 DIAGNOSIS — Z720 Tobacco use: Secondary | ICD-10-CM | POA: Diagnosis not present

## 2015-04-29 DIAGNOSIS — G4733 Obstructive sleep apnea (adult) (pediatric): Secondary | ICD-10-CM | POA: Diagnosis not present

## 2015-04-29 DIAGNOSIS — E559 Vitamin D deficiency, unspecified: Secondary | ICD-10-CM | POA: Diagnosis not present

## 2015-05-03 ENCOUNTER — Ambulatory Visit (INDEPENDENT_AMBULATORY_CARE_PROVIDER_SITE_OTHER): Payer: BLUE CROSS/BLUE SHIELD | Admitting: Family

## 2015-05-03 ENCOUNTER — Encounter: Payer: Self-pay | Admitting: Family

## 2015-05-03 VITALS — BP 116/64 | HR 92 | Temp 98.6°F | Resp 16 | Ht 74.0 in | Wt 337.0 lb

## 2015-05-03 DIAGNOSIS — M1990 Unspecified osteoarthritis, unspecified site: Secondary | ICD-10-CM | POA: Insufficient documentation

## 2015-05-03 DIAGNOSIS — M170 Bilateral primary osteoarthritis of knee: Secondary | ICD-10-CM

## 2015-05-03 MED ORDER — PREDNISONE 10 MG (48) PO TBPK: ORAL_TABLET | ORAL | Status: DC

## 2015-05-03 NOTE — Progress Notes (Signed)
Pre visit review using our clinic review tool, if applicable. No additional management support is needed unless otherwise documented below in the visit note. 

## 2015-05-03 NOTE — Patient Instructions (Addendum)
Thank you for choosing [facility].  Summary/Instructions:  Ice 2-3 times per day and nightly for pain and inflammation. Continue working on aquatic therapy. Follow up for symptom worsening.   12 Day Prednisone Taper Instructions   Days 1-4: Two tablets before breakfast, one after lunch, one after dinner, and two at bedtime.  Days 5-8: One tablet before breakfast, one after lunch, one after dinner, and one at bedtime  Days 9-12: One tablet before breakfast and one at bedtime  Your prescription(s) have been submitted to your pharmacy or been printed and provided for you. Please take as directed and contact our office if you believe you are having problem(s) with the medication(s) or have any questions.  If your symptoms worsen or fail to improve, please contact our office for further instruction, or in case of emergency go directly to the emergency room at the closest medical facility.   Generic Knee Exercises EXERCISES RANGE OF MOTION (ROM) AND STRETCHING EXERCISES These exercises may help you when beginning to rehabilitate your injury. Your symptoms may resolve with or without further involvement from your physician, physical therapist, or athletic trainer. While completing these exercises, remember:  2. Restoring tissue flexibility helps normal motion to return to the joints. This allows healthier, less painful movement and activity. 3. An effective stretch should be held for at least 30 seconds. 4. A stretch should never be painful. You should only feel a gentle lengthening or release in the stretched tissue. STRETCH - Knee Extension, Prone 2. Lie on your stomach on a firm surface, such as a bed or countertop. Place your right / left knee and leg just beyond the edge of the surface. You may wish to place a towel under the far end of your right / left thigh for comfort. 3. Relax your leg muscles and allow gravity to straighten your knee. Your clinician may advise you to add an  ankle weight if more resistance is helpful for you. 4. You should feel a stretch in the back of your right / left knee. Hold this position for __________ seconds. Repeat __________ times. Complete this stretch __________ times per day. * Your physician, physical therapist, or athletic trainer may ask you to add ankle weight to enhance your stretch.  RANGE OF MOTION - Knee Flexion, Active 2. Lie on your back with both knees straight. (If this causes back discomfort, bend your opposite knee, placing your foot flat on the floor.) 3. Slowly slide your heel back toward your buttocks until you feel a gentle stretch in the front of your knee or thigh. 4. Hold for __________ seconds. Slowly slide your heel back to the starting position. Repeat __________ times. Complete this exercise __________ times per day.  STRETCH - Quadriceps, Prone   Lie on your stomach on a firm surface, such as a bed or padded floor.  Bend your right / left knee and grasp your ankle. If you are unable to reach your ankle or pant leg, use a belt around your foot to lengthen your reach.  Gently pull your heel toward your buttocks. Your knee should not slide out to the side. You should feel a stretch in the front of your thigh and/or knee.  Hold this position for __________ seconds. Repeat __________ times. Complete this stretch __________ times per day.  STRETCH - Hamstrings, Supine   Lie on your back. Loop a belt or towel over the ball of your right / left foot.  Straighten your right / left knee and  slowly pull on the belt to raise your leg. Do not allow the right / left knee to bend. Keep your opposite leg flat on the floor.  Raise the leg until you feel a gentle stretch behind your right / left knee or thigh. Hold this position for __________ seconds. Repeat __________ times. Complete this stretch __________ times per day.  STRENGTHENING EXERCISES These exercises may help you when beginning to rehabilitate your injury.  They may resolve your symptoms with or without further involvement from your physician, physical therapist, or athletic trainer. While completing these exercises, remember:   Muscles can gain both the endurance and the strength needed for everyday activities through controlled exercises.  Complete these exercises as instructed by your physician, physical therapist, or athletic trainer. Progress the resistance and repetitions only as guided.  You may experience muscle soreness or fatigue, but the pain or discomfort you are trying to eliminate should never worsen during these exercises. If this pain does worsen, stop and make certain you are following the directions exactly. If the pain is still present after adjustments, discontinue the exercise until you can discuss the trouble with your clinician. STRENGTH - Quadriceps, Isometrics  Lie on your back with your right / left leg extended and your opposite knee bent.  Gradually tense the muscles in the front of your right / left thigh. You should see either your knee cap slide up toward your hip or increased dimpling just above the knee. This motion will push the back of the knee down toward the floor/mat/bed on which you are lying.  Hold the muscle as tight as you can without increasing your pain for __________ seconds.  Relax the muscles slowly and completely in between each repetition. Repeat __________ times. Complete this exercise __________ times per day.  STRENGTH - Quadriceps, Short Arcs   Lie on your back. Place a __________ inch towel roll under your knee so that the knee slightly bends.  Raise only your lower leg by tightening the muscles in the front of your thigh. Do not allow your thigh to rise.  Hold this position for __________ seconds. Repeat __________ times. Complete this exercise __________ times per day.  OPTIONAL ANKLE WEIGHTS: Begin with ____________________, but DO NOT exceed ____________________. Increase in 1 pound/0.5  kilogram increments.  STRENGTH - Quadriceps, Straight Leg Raises  Quality counts! Watch for signs that the quadriceps muscle is working to insure you are strengthening the correct muscles and not "cheating" by substituting with healthier muscles.  Lay on your back with your right / left leg extended and your opposite knee bent.  Tense the muscles in the front of your right / left thigh. You should see either your knee cap slide up or increased dimpling just above the knee. Your thigh may even quiver.  Tighten these muscles even more and raise your leg 4 to 6 inches off the floor. Hold for __________ seconds.  Keeping these muscles tense, lower your leg.  Relax the muscles slowly and completely in between each repetition. Repeat __________ times. Complete this exercise __________ times per day.  STRENGTH - Hamstring, Curls  Lay on your stomach with your legs extended. (If you lay on a bed, your feet may hang over the edge.)  Tighten the muscles in the back of your thigh to bend your right / left knee up to 90 degrees. Keep your hips flat on the bed/floor.  Hold this position for __________ seconds.  Slowly lower your leg back to the starting  position. Repeat __________ times. Complete this exercise __________ times per day.  OPTIONAL ANKLE WEIGHTS: Begin with ____________________, but DO NOT exceed ____________________. Increase in 1 pound/0.5 kilogram increments.  STRENGTH - Quadriceps, Squats  Stand in a door frame so that your feet and knees are in line with the frame.  Use your hands for balance, not support, on the frame.  Slowly lower your weight, bending at the hips and knees. Keep your lower legs upright so that they are parallel with the door frame. Squat only within the range that does not increase your knee pain. Never let your hips drop below your knees.  Slowly return upright, pushing with your legs, not pulling with your hands. Repeat __________ times. Complete this  exercise __________ times per day.  STRENGTH - Quadriceps, Wall Slides  Follow guidelines for form closely. Increased knee pain often results from poorly placed feet or knees.  Lean against a smooth wall or door and walk your feet out 18-24 inches. Place your feet hip-width apart.  Slowly slide down the wall or door until your knees bend __________ degrees.* Keep your knees over your heels, not your toes, and in line with your hips, not falling to either side.  Hold for __________ seconds. Stand up to rest for __________ seconds in between each repetition. Repeat __________ times. Complete this exercise __________ times per day. * Your physician, physical therapist, or athletic trainer will alter this angle based on your symptoms and progress.   This information is not intended to replace advice given to you by your health care provider. Make sure you discuss any questions you have with your health care provider.   Document Released: 01/21/2005 Document Revised: 03/30/2014 Document Reviewed: 06/21/2008 Elsevier Interactive Patient Education 2016 Elsevier Inc.  Osteoarthritis Osteoarthritis is a disease that causes soreness and inflammation of a joint. It occurs when the cartilage at the affected joint wears down. Cartilage acts as a cushion, covering the ends of bones where they meet to form a joint. Osteoarthritis is the most common form of arthritis. It often occurs in older people. The joints affected most often by this condition include those in the: 5. Ends of the fingers. 6. Thumbs. 7. Neck. 8. Lower back. 9. Knees. 10. Hips. CAUSES  Over time, the cartilage that covers the ends of bones begins to wear away. This causes bone to rub on bone, producing pain and stiffness in the affected joints.  RISK FACTORS Certain factors can increase your chances of having osteoarthritis, including: 5. Older age. 6. Excessive body weight. 7. Overuse of joints. 8. Previous joint injury. SIGNS  AND SYMPTOMS  5. Pain, swelling, and stiffness in the joint. 6. Over time, the joint may lose its normal shape. 7. Small deposits of bone (osteophytes) may grow on the edges of the joint. 8. Bits of bone or cartilage can break off and float inside the joint space. This may cause more pain and damage. DIAGNOSIS  Your health care provider will do a physical exam and ask about your symptoms. Various tests may be ordered, such as:  X-rays of the affected joint.  Blood tests to rule out other types of arthritis. Additional tests may be used to diagnose your condition. TREATMENT  Goals of treatment are to control pain and improve joint function. Treatment plans may include:  A prescribed exercise program that allows for rest and joint relief.  A weight control plan.  Pain relief techniques, such as:  Properly applied heat and cold.  IT trainer  pulses delivered to nerve endings under the skin (transcutaneous electrical nerve stimulation [TENS]).  Massage.  Certain nutritional supplements.  Medicines to control pain, such as:  Acetaminophen.  Nonsteroidal anti-inflammatory drugs (NSAIDs), such as naproxen.  Narcotic or central-acting agents, such as tramadol.  Corticosteroids. These can be given orally or as an injection.  Surgery to reposition the bones and relieve pain (osteotomy) or to remove loose pieces of bone and cartilage. Joint replacement may be needed in advanced states of osteoarthritis. HOME CARE INSTRUCTIONS   Take medicines only as directed by your health care provider.  Maintain a healthy weight. Follow your health care provider's instructions for weight control. This may include dietary instructions.  Exercise as directed. Your health care provider can recommend specific types of exercise. These may include:  Strengthening exercises. These are done to strengthen the muscles that support joints affected by arthritis. They can be performed with weights or with  exercise bands to add resistance.  Aerobic activities. These are exercises, such as brisk walking or low-impact aerobics, that get your heart pumping.  Range-of-motion activities. These keep your joints limber.  Balance and agility exercises. These help you maintain daily living skills.  Rest your affected joints as directed by your health care provider.  Keep all follow-up visits as directed by your health care provider. SEEK MEDICAL CARE IF:   Your skin turns red.  You develop a rash in addition to your joint pain.  You have worsening joint pain.  You have a fever along with joint or muscle aches. SEEK IMMEDIATE MEDICAL CARE IF:  You have a significant loss of weight or appetite.  You have night sweats. [place] of Arthritis and Musculoskeletal and Skin Diseases: www.niams.SouthExposed.es  [employer] on Aging: http://kim-miller.com/  American College of Rheumatology: www.rheumatology.org   This information is not intended to replace advice given to you by your health care provider. Make sure you discuss any questions you have with your health care provider.   Document Released: 03/09/2005 Document Revised: 03/30/2014 Document Reviewed: 11/14/2012 Elsevier Interactive Patient Education [employer].

## 2015-05-03 NOTE — Progress Notes (Signed)
Subjective:    Patient ID: Craig Flynn, male    DOB: [birth year], 55 y.o.   MRN: [id number]  Chief Complaint  Patient presents with  . Leg Pain    having bilateral leg pain behind the knee and at the front of the knee, was called in prednisone for it a while back and the pain went away but came right back    HPI:  Craig Flynn is a 55 y.o. male who  has a past medical history of Hypertension; Gout; Other specified disorder of intestines; CHF (congestive heart failure) ([place]); and Shortness of breath dyspnea. and presents today for an office visit.   Continues to experience the associated symptom of pain located in his bilateral knees that has been going on for several months. Pain is described as sharp and is located in both the front and back of the knees and the timing of the symptoms is worse in the mornings. Modifying factors include a prednisone tamper, hot towels and walking with attempts to lose weight. Has been seen by orthopedics and received a cortisone injection which only lasted him about a week. He is working on doing aquatic therapy through [facility] [employer]. Denies any new trauma.   No Known Allergies   Current Outpatient Prescriptions on File Prior to Visit  Medication Sig Dispense Refill  . albuterol (PROVENTIL HFA;VENTOLIN HFA) 108 (90 BASE) MCG/ACT inhaler Inhale 2 puffs into the lungs every 6 (six) hours as needed for wheezing or shortness of breath. 1 Inhaler 2  . allopurinol (ZYLOPRIM) 300 MG tablet Take 1 tablet (300 mg total) by mouth daily. 30 tablet 3  . aspirin 325 MG tablet Take 1 tablet (325 mg total) by mouth daily. 30 tablet 3  . benzonatate (TESSALON) 100 MG capsule Take 1 capsule (100 mg total) by mouth 2 (two) times daily as needed for cough. 30 capsule 0  . carvedilol (COREG) 25 MG tablet Take 1 tablet (25 mg total) by mouth 2 (two) times daily. 60 tablet 6  . fenofibrate 54 MG tablet Take 1 tablet (54 mg total) by mouth daily. 30 tablet 3  . furosemide  (LASIX) 80 MG tablet Take 1 tablet (80 mg total) by mouth 2 (two) times daily. 60 tablet 1  . guaiFENesin (MUCINEX) 600 MG 12 hr tablet Take 2 tablets (1,200 mg total) by mouth 2 (two) times daily. 20 tablet 0  . hydrALAZINE (APRESOLINE) 25 MG tablet Take 1.5 tablets (37.5 mg total) by mouth 3 (three) times daily. 120 tablet 1  . isosorbide mononitrate (IMDUR) 30 MG 24 hr tablet Take 1 tablet (30 mg total) by mouth daily. 30 tablet 3  . potassium chloride SA (K-DUR,KLOR-CON) 20 MEQ tablet Take 1 tablet (20 mEq total) by mouth 2 (two) times daily. 60 tablet 3  . tiZANidine (ZANAFLEX) 4 MG tablet Take 1 tablet (4 mg total) by mouth every 8 (eight) hours as needed. 30 tablet 0   No current facility-administered medications on file prior to visit.    Review of Systems  Musculoskeletal: Positive for joint swelling and gait problem.       Positive for bilateral knee pain  Neurological: Negative for weakness and numbness.      Objective:    BP 116/64 mmHg  Pulse 92  Temp(Src) 98.6 F (37 C) (Oral)  Resp 16  Ht 6\' 2"  (1.88 m)  Wt 337 lb (152.862 kg)  BMI 43.25 kg/m2  SpO2 99% Nursing note and vital signs reviewed.  Physical  Exam  Constitutional: He is oriented to person, place, and time. He appears well-developed and well-nourished. No distress.  Cardiovascular: Normal rate, regular rhythm, normal heart sounds and intact distal pulses.   Pulmonary/Chest: Effort normal and breath sounds normal.  Musculoskeletal:  Bilateral knees - mild/moderate edema with no obvious deformity or discoloration. No increased temperature. There is mild tenderness on the medial/lateral joint lines and posterior aspect of the knees bilaterally. Range of motion is within normal limits. Strength is 4+. There is difficult with initiating ambulation. Distal pulses, sensation, and reflexes are intact and appropriate.  Neurological: He is alert and oriented to person, place, and time.  Skin: Skin is warm and dry.    Psychiatric: He has a normal mood and affect. His behavior is normal. Judgment and thought content normal.       Assessment & Plan:   Problem List Items Addressed This Visit      Musculoskeletal and Integument   Osteoarthritis - Primary    Osteoarthritis with failed therapy with cortisone injections and treatment challenged by co-morbid conditions. Weight is significant contributing and limiting factor. He has been seen by orthopedics who recommend knee replacement specifically on the right to start and most likely on the left in the future. Recommend continue current dosage of Vitamin D. Ice 2-3 times per day as needed. Avoid NSAIDs secondary to cardiac and renal disease. Restart prednisone taper. Follow up if symptoms worsen or fail to improve.       Relevant Medications   predniSONE (STERAPRED UNI-PAK 48 TAB) 10 MG (48) TBPK tablet

## 2015-05-03 NOTE — Assessment & Plan Note (Signed)
Osteoarthritis with failed therapy with cortisone injections and treatment challenged by co-morbid conditions. Weight is significant contributing and limiting factor. He has been seen by orthopedics who recommend knee replacement specifically on the right to start and most likely on the left in the future. Recommend continue current dosage of Vitamin D. Ice 2-3 times per day as needed. Avoid NSAIDs secondary to cardiac and renal disease. Restart prednisone taper. Follow up if symptoms worsen or fail to improve.

## 2015-05-23 DIAGNOSIS — G4733 Obstructive sleep apnea (adult) (pediatric): Secondary | ICD-10-CM | POA: Diagnosis not present

## 2015-06-03 ENCOUNTER — Ambulatory Visit: Payer: BLUE CROSS/BLUE SHIELD | Admitting: Adult Health

## 2015-06-10 ENCOUNTER — Other Ambulatory Visit: Payer: Self-pay | Admitting: *Deleted

## 2015-06-10 MED ORDER — HYDRALAZINE HCL 25 MG PO TABS: 37.5000 mg | ORAL_TABLET | Freq: Three times a day (TID) | ORAL | Status: DC

## 2015-06-10 NOTE — Telephone Encounter (Signed)
Rx(s) sent to pharmacy electronically.  

## 2015-06-20 ENCOUNTER — Telehealth: Payer: Self-pay | Admitting: *Deleted

## 2015-06-20 MED ORDER — PREDNISONE 10 MG (48) PO TBPK: ORAL_TABLET | ORAL | Status: DC

## 2015-06-20 NOTE — Telephone Encounter (Signed)
Sent to pharmacy 

## 2015-06-20 NOTE — Telephone Encounter (Signed)
Receive call pt states he is having flare up with his arthritis in his knee. Requesting greg to send some prednisone to pharmacy...Johny Chess

## 2015-06-21 NOTE — Telephone Encounter (Signed)
Notified pt Craig Flynn sent rx to pharmacy.../lmb 

## 2015-06-22 DIAGNOSIS — D649 Anemia, unspecified: Secondary | ICD-10-CM

## 2015-06-22 DIAGNOSIS — R079 Chest pain, unspecified: Secondary | ICD-10-CM

## 2015-06-22 HISTORY — DX: Chest pain, unspecified: R07.9

## 2015-06-22 HISTORY — DX: Anemia, unspecified: D64.9

## 2015-06-23 NOTE — Progress Notes (Signed)
Cardiology Office Note   Date:  06/24/2015   ID:  Craig Flynn, DOB 04-05-1960, MRN [id number]  PCP:  Mauricio Po, FNP  Cardiologist:   Minus Breeding, MD   Chief Complaint  Patient presents with  . Chest Pain      History of Present Illness: Craig Flynn is a 55 y.o. male who presents for evaluation of cardiomyopathy and hypertension. The patient has long-standing hypertension. He was seen a couple of years ago with mildly reduced ejection fraction of about 45%. He did not follow-up with cardiology. He was seen by [place] in consultation later when he was admitted with volume overload and hypertension. He was admitted again last year and was in the hospital in July.  Of note he had another echocardiogram in July 2016 and his EF was said to be about 55%.  Since I saw him he's had sleep study and has CPAP. He has been in the hospital again in December I reviewed these records. They do not mention heart failure. They really mention that he had bronchitis and atypical chest pain. There was no echocardiogram.  Past Medical History  Diagnosis Date  . Hypertension   . Gout   . 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 CHF (congestive heart failure) ([place])   . Shortness of breath dyspnea     Past Surgical History  Procedure Laterality Date  . Cholecystectomy    . Umbilical hernia repair       Current Outpatient Prescriptions  Medication Sig Dispense Refill  . albuterol (PROVENTIL HFA;VENTOLIN HFA) 108 (90 BASE) MCG/ACT inhaler Inhale 2 puffs into the lungs every 6 (six) hours as needed for wheezing or shortness of breath. 1 Inhaler 2  . allopurinol (ZYLOPRIM) 300 MG tablet Take 1 tablet (300 mg total) by mouth daily. 30 tablet 3  . aspirin 325 MG tablet Take 1 tablet (325 mg total) by mouth daily. 30 tablet 3  . carvedilol (COREG) 25 MG tablet Take 1 tablet (25 mg total) by mouth 2 (two) times daily. 60 tablet 6  .  fenofibrate 54 MG tablet Take 1 tablet (54 mg total) by mouth daily. 30 tablet 11  . furosemide (LASIX) 80 MG tablet Take 1 tablet (80 mg total) by mouth 2 (two) times daily. 60 tablet 1  . guaiFENesin (MUCINEX) 600 MG 12 hr tablet Take 2 tablets (1,200 mg total) by mouth 2 (two) times daily. 20 tablet 0  . hydrALAZINE (APRESOLINE) 25 MG tablet Take 1.5 tablets (37.5 mg total) by mouth 3 (three) times daily. 135 tablet 11  . isosorbide mononitrate (IMDUR) 30 MG 24 hr tablet Take 1 tablet (30 mg total) by mouth daily. 30 tablet 11  . potassium chloride SA (K-DUR,KLOR-CON) 20 MEQ tablet Take 1 tablet (20 mEq total) by mouth 2 (two) times daily. 60 tablet 3  . predniSONE (STERAPRED UNI-PAK 48 TAB) 10 MG (48) TBPK tablet Take 6 tablets x 4 days, 4 tablets x 4 days, 2 tablets x 2 days 48 tablet 0  . tiZANidine (ZANAFLEX) 4 MG tablet Take 1 tablet (4 mg total) by mouth every 8 (eight) hours as needed. 30 tablet 0  . Vitamin D, Ergocalciferol, (DRISDOL) 50000 units CAPS capsule Take 2 capsules by mouth once a week.  0   No current facility-administered medications for this visit.    Allergies:   Review of patient's allergies indicates no known allergies.  ROS:  Please see the history of present illness.   Otherwise, review of systems are positive for none.   All other systems are reviewed and negative.    PHYSICAL EXAM: VS:  BP 160/85 mmHg  Pulse 72  Ht 6\' 2"  (1.88 m)  Wt 329 lb 12.8 oz (149.596 kg)  BMI 42.33 kg/m2 , BMI Body mass index is 42.33 kg/(m^2). GENERAL:  Well appearing HEENT:  Pupils equal round and reactive, fundi not visualized, oral mucosa unremarkable NECK:  No jugular venous distention, waveform within normal limits, carotid upstroke brisk and symmetric, no bruits, no thyromegaly LYMPHATICS:  No cervical, inguinal adenopathy LUNGS:  Clear to auscultation bilaterally BACK:  No CVA tenderness CHEST:  Unremarkable HEART:  PMI not displaced or sustained,S1 and S2 within normal  limits, no S3, no S4, no clicks, no rubs, no murmurs ABD:  Flat, positive bowel sounds normal in frequency in pitch, no bruits, no rebound, no guarding, no midline pulsatile mass, no hepatomegaly, no splenomegaly EXT:  2 plus pulses throughout, mild edema chronic right greater than left leg swelling, no cyanosis no clubbing SKIN:  No rashes no nodules NEURO:  Cranial nerves II through XII grossly intact, motor grossly intact throughout PSYCH:  Cognitively intact, oriented to person place and time    EKG:  EKG is not ordered today.    Recent Labs: 02/25/2015: B Natriuretic Peptide 22.0 03/06/2015: ALT 20; BUN 64*; Creatinine, Ser 3.44*; Hemoglobin 13.4; Platelets 277.0; Potassium 3.9; Sodium 138    Lipid Panel    Component Value Date/Time   CHOL 148 09/22/2014 1400   TRIG 324* 09/22/2014 1400   TRIG 98 03/03/2006 0910   HDL 23* 09/22/2014 1400   CHOLHDL 6.4 09/22/2014 1400   CHOLHDL 5.5 CALC 03/03/2006 0910   VLDL 65* 09/22/2014 1400   LDLCALC 60 09/22/2014 1400      Wt Readings from Last 3 Encounters:  06/24/15 329 lb 12.8 oz (149.596 kg)  05/03/15 337 lb (152.862 kg)  04/11/15 310 lb (140.615 kg)      Other studies Reviewed: Additional studies/ records that were reviewed today include: Hospital records  Review of the above records demonstrates:  Please see elsewhere in the note.     ASSESSMENT AND PLAN:  CARDIOMYOPATHY:  I suspect the patient does have a mildly reduced ejection fraction) I suspect that it is related to long-standing hypertension. At some point I will consider an ischemia workup. However, his ejection fraction was better in 2016 then in 2015.  He is not having any chest pain. Shortness of breath is actually chronic. We again talked about risk reduction to include salt and fluid restriction. Of note his last BNP was 22 surviving it unlikely that he has volume overload as he was having his chronic dyspnea at that time as well.  If he continues to lose  weight and still has more shortness of breath rather than less I will consider stress perfusion imaging. At this point the pretest probability of obstructive coronary disease in the absence of symptoms and significant risk factors is low.  HTN:  This is being managed in the context of treating his CHF.  His BP is elevated but this is not usual.  At home it is typically 120s.  He will keep an eye on this.  No change in therapy is indicate.d   OBESITY:  The patient understands the need to lose weight with diet and exercise. We have discussed specific strategies for this.  We had again a  long discussion about this.  CKD:  He is due to follow-up with nephrology.  EDEMA:  I reviewed his previous Doppler and he has no history of DVTs. I suspect he has some venous incompetence on the right. We talked about compression stockings again salt and fluid management.   Current medicines are reviewed at length with the patient today.  The patient does not have concerns regarding medicines.  The following changes have been made:  As above  Labs/ tests ordered today include: None No orders of the defined types were placed in this encounter.     Disposition:   FU with APP in one month.    Signed, Minus Breeding, MD  06/24/2015 8:50 AM    [facility]

## 2015-06-24 ENCOUNTER — Encounter: Payer: Self-pay | Admitting: Cardiology

## 2015-06-24 ENCOUNTER — Ambulatory Visit (INDEPENDENT_AMBULATORY_CARE_PROVIDER_SITE_OTHER): Payer: BLUE CROSS/BLUE SHIELD | Admitting: Cardiology

## 2015-06-24 VITALS — BP 160/85 | HR 72 | Ht 74.0 in | Wt 329.8 lb

## 2015-06-24 DIAGNOSIS — R0602 Shortness of breath: Secondary | ICD-10-CM | POA: Diagnosis not present

## 2015-06-24 MED ORDER — HYDRALAZINE HCL 25 MG PO TABS: 37.5000 mg | ORAL_TABLET | Freq: Three times a day (TID) | ORAL | Status: DC

## 2015-06-24 MED ORDER — FENOFIBRATE 54 MG PO TABS: 54.0000 mg | ORAL_TABLET | Freq: Every day | ORAL | Status: DC

## 2015-06-24 MED ORDER — ISOSORBIDE MONONITRATE ER 30 MG PO TB24: 30.0000 mg | ORAL_TABLET | Freq: Every day | ORAL | Status: DC

## 2015-06-24 NOTE — Patient Instructions (Signed)
Your physician wants you to follow-up in: 6 Months You will receive a reminder letter in the mail two months in advance. If you don't receive a letter, please call our office to schedule the follow-up appointment.  

## 2015-07-04 ENCOUNTER — Ambulatory Visit: Payer: BLUE CROSS/BLUE SHIELD | Admitting: Family

## 2015-07-04 DIAGNOSIS — Z0289 Encounter for other administrative examinations: Secondary | ICD-10-CM

## 2015-07-04 DIAGNOSIS — I519 Heart disease, unspecified: Secondary | ICD-10-CM | POA: Diagnosis not present

## 2015-07-04 DIAGNOSIS — G4733 Obstructive sleep apnea (adult) (pediatric): Secondary | ICD-10-CM | POA: Diagnosis not present

## 2015-07-04 DIAGNOSIS — E559 Vitamin D deficiency, unspecified: Secondary | ICD-10-CM | POA: Diagnosis not present

## 2015-07-04 DIAGNOSIS — N2581 Secondary hyperparathyroidism of renal origin: Secondary | ICD-10-CM | POA: Diagnosis not present

## 2015-07-04 DIAGNOSIS — D649 Anemia, unspecified: Secondary | ICD-10-CM | POA: Diagnosis not present

## 2015-07-04 DIAGNOSIS — I10 Essential (primary) hypertension: Secondary | ICD-10-CM | POA: Diagnosis not present

## 2015-07-04 DIAGNOSIS — N184 Chronic kidney disease, stage 4 (severe): Secondary | ICD-10-CM | POA: Diagnosis not present

## 2015-07-04 DIAGNOSIS — I129 Hypertensive chronic kidney disease with stage 1 through stage 4 chronic kidney disease, or unspecified chronic kidney disease: Secondary | ICD-10-CM | POA: Diagnosis not present

## 2015-07-09 ENCOUNTER — Other Ambulatory Visit: Payer: Self-pay | Admitting: *Deleted

## 2015-07-09 MED ORDER — POTASSIUM CHLORIDE CRYS ER 20 MEQ PO TBCR: 20.0000 meq | EXTENDED_RELEASE_TABLET | Freq: Two times a day (BID) | ORAL | Status: DC

## 2015-07-09 NOTE — Telephone Encounter (Signed)
Cancelled the K order.

## 2015-07-10 ENCOUNTER — Telehealth: Payer: Self-pay | Admitting: Family

## 2015-07-10 DIAGNOSIS — M170 Bilateral primary osteoarthritis of knee: Secondary | ICD-10-CM

## 2015-07-10 NOTE — Telephone Encounter (Signed)
Patient is requesting a refill of predniSONE (STERAPRED UNI-PAK 48 TAB) 10 MG (48) TBPK tablet [id number] to [facility] on [address]

## 2015-07-10 NOTE — Telephone Encounter (Signed)
Does pt need to come in for OV

## 2015-07-10 NOTE — Telephone Encounter (Signed)
He just recently completed a dosepak about 2 weeks ago. If he continues to have problem we need to consider additional treatment.

## 2015-07-10 NOTE — Telephone Encounter (Signed)
Yes please or we can refer him to orthopedics

## 2015-07-11 MED ORDER — ALLOPURINOL 300 MG PO TABS: 300.0000 mg | ORAL_TABLET | Freq: Every day | ORAL | Status: DC

## 2015-07-11 NOTE — Telephone Encounter (Signed)
Pt is coming in for OV next Thursday. He is interested in doing the referral as well.

## 2015-07-12 NOTE — Addendum Note (Signed)
Addended by: Mauricio Po D on: 07/12/2015 11:40 AM   Modules accepted: Orders

## 2015-07-12 NOTE — Telephone Encounter (Signed)
Referral sent 

## 2015-07-17 ENCOUNTER — Telehealth: Payer: Self-pay | Admitting: Cardiology

## 2015-07-17 NOTE — Telephone Encounter (Addendum)
Patient called c/o new onset CP, SOB and dizziness for 2-3 days.  CP is located under the L breast that does not radiate. He st the pain is constant on exertion but "only when he breathes" at rest. SOB is mild while sitting, but he "can't seem to catch his breath" on exertion. He was not audibly SOB on the phone. He c/o "choking" on clear phlegm for the last couple days. He says this makes his SOB worse. He st he does not feel congested, but has experienced dizziness, especially while walking, for 2 days. Patient is taking medications as directed.  His last BP was 120/85-90 at Dr. Sanda Klein office 4/13. Reviewed S/S with Cecilie Kicks, NP. Per Mickel Baas, instructed patient to go to the ED for treatment. Patient agrees with plan and was grateful for follow-up.

## 2015-07-17 NOTE — Telephone Encounter (Signed)
New message   Pt c/o of Chest Pain: STAT if CP now or developed within 24 hours  1. Are you having CP right now? Yes right on the left side  2. Are you experiencing any other symptoms (ex. SOB, nausea, vomiting, sweating)? Nausea, SOB, dizzy, comes and goes worse when he walks  3. How long have you been experiencing CP? 2 or 3 days its getting worse 4. Is your CP continuous or coming and going? Coming and going  5. Have you taken Nitroglycerin? no?

## 2015-07-18 ENCOUNTER — Observation Stay (HOSPITAL_COMMUNITY)
Admission: EM | Admit: 2015-07-18 | Discharge: 2015-07-20 | Disposition: A | Discharge status: Home / Self Care | Payer: BLUE CROSS/BLUE SHIELD | Attending: Internal Medicine | Admitting: Internal Medicine

## 2015-07-18 ENCOUNTER — Emergency Department (HOSPITAL_COMMUNITY): Payer: BLUE CROSS/BLUE SHIELD

## 2015-07-18 ENCOUNTER — Encounter (HOSPITAL_COMMUNITY): Payer: Self-pay | Admitting: *Deleted

## 2015-07-18 ENCOUNTER — Ambulatory Visit: Payer: BLUE CROSS/BLUE SHIELD | Admitting: Family

## 2015-07-18 ENCOUNTER — Emergency Department (HOSPITAL_BASED_OUTPATIENT_CLINIC_OR_DEPARTMENT_OTHER)
Admit: 2015-07-18 | Discharge: 2015-07-18 | Disposition: A | Discharge status: Home / Self Care | Payer: BLUE CROSS/BLUE SHIELD | Attending: Emergency Medicine | Admitting: Emergency Medicine

## 2015-07-18 DIAGNOSIS — N189 Chronic kidney disease, unspecified: Secondary | ICD-10-CM | POA: Insufficient documentation

## 2015-07-18 DIAGNOSIS — I5022 Chronic systolic (congestive) heart failure: Secondary | ICD-10-CM | POA: Diagnosis not present

## 2015-07-18 DIAGNOSIS — I10 Essential (primary) hypertension: Secondary | ICD-10-CM | POA: Diagnosis not present

## 2015-07-18 DIAGNOSIS — M7989 Other specified soft tissue disorders: Secondary | ICD-10-CM

## 2015-07-18 DIAGNOSIS — I429 Cardiomyopathy, unspecified: Secondary | ICD-10-CM | POA: Diagnosis not present

## 2015-07-18 DIAGNOSIS — G4733 Obstructive sleep apnea (adult) (pediatric): Secondary | ICD-10-CM | POA: Insufficient documentation

## 2015-07-18 DIAGNOSIS — I130 Hypertensive heart and chronic kidney disease with heart failure and stage 1 through stage 4 chronic kidney disease, or unspecified chronic kidney disease: Secondary | ICD-10-CM | POA: Diagnosis not present

## 2015-07-18 DIAGNOSIS — N179 Acute kidney failure, unspecified: Secondary | ICD-10-CM | POA: Insufficient documentation

## 2015-07-18 DIAGNOSIS — M109 Gout, unspecified: Secondary | ICD-10-CM | POA: Insufficient documentation

## 2015-07-18 DIAGNOSIS — R0789 Other chest pain: Principal | ICD-10-CM | POA: Insufficient documentation

## 2015-07-18 DIAGNOSIS — Z7982 Long term (current) use of aspirin: Secondary | ICD-10-CM | POA: Diagnosis not present

## 2015-07-18 DIAGNOSIS — D631 Anemia in chronic kidney disease: Secondary | ICD-10-CM | POA: Diagnosis not present

## 2015-07-18 DIAGNOSIS — F17210 Nicotine dependence, cigarettes, uncomplicated: Secondary | ICD-10-CM | POA: Insufficient documentation

## 2015-07-18 DIAGNOSIS — E876 Hypokalemia: Secondary | ICD-10-CM | POA: Diagnosis present

## 2015-07-18 DIAGNOSIS — Z6841 Body Mass Index (BMI) 40.0 and over, adult: Secondary | ICD-10-CM | POA: Insufficient documentation

## 2015-07-18 DIAGNOSIS — R079 Chest pain, unspecified: Secondary | ICD-10-CM | POA: Diagnosis present

## 2015-07-18 DIAGNOSIS — E785 Hyperlipidemia, unspecified: Secondary | ICD-10-CM | POA: Diagnosis not present

## 2015-07-18 DIAGNOSIS — N184 Chronic kidney disease, stage 4 (severe): Secondary | ICD-10-CM | POA: Diagnosis present

## 2015-07-18 HISTORY — DX: Anemia, unspecified: D64.9

## 2015-07-18 HISTORY — DX: Cardiomyopathy, unspecified: I42.9

## 2015-07-18 HISTORY — DX: Unspecified osteoarthritis, unspecified site: M19.90

## 2015-07-18 HISTORY — DX: Chest pain, unspecified: R07.9

## 2015-07-18 HISTORY — DX: Chronic kidney disease, stage 4 (severe): N18.4

## 2015-07-18 LAB — BASIC METABOLIC PANEL
Anion gap: 13 (ref 5–15)
BUN: 34 mg/dL — ABNORMAL HIGH (ref 6–20)
CO2: 25 mmol/L (ref 22–32)
Calcium: 9.7 mg/dL (ref 8.9–10.3)
Chloride: 104 mmol/L (ref 101–111)
Creatinine, Ser: 4.07 mg/dL — ABNORMAL HIGH (ref 0.61–1.24)
GFR calc Af Amer: 18 mL/min — ABNORMAL LOW (ref >60)
GFR calc non Af Amer: 15 mL/min — ABNORMAL LOW (ref >60)
Glucose, Bld: 152 mg/dL — ABNORMAL HIGH (ref 65–99)
Potassium: 3.4 mmol/L — ABNORMAL LOW (ref 3.5–5.1)
Sodium: 142 mmol/L (ref 135–145)

## 2015-07-18 LAB — CBC
HCT: 35.3 % — ABNORMAL LOW (ref 39.0–52.0)
Hemoglobin: 10.7 g/dL — ABNORMAL LOW (ref 13.0–17.0)
MCH: 26 pg (ref 26.0–34.0)
MCHC: 30.3 g/dL (ref 30.0–36.0)
MCV: 85.7 fL (ref 78.0–100.0)
Platelets: 205 10*3/uL (ref 150–400)
RBC: 4.12 MIL/uL — ABNORMAL LOW (ref 4.22–5.81)
RDW: 16.2 % — ABNORMAL HIGH (ref 11.5–15.5)
WBC: 5.6 10*3/uL (ref 4.0–10.5)

## 2015-07-18 LAB — TROPONIN I
Troponin I: <0.03 ng/mL (ref <0.031)
Troponin I: <0.03 ng/mL (ref <0.031)

## 2015-07-18 LAB — I-STAT TROPONIN, ED: Troponin i, poc: 0.02 ng/mL (ref 0.00–0.08)

## 2015-07-18 LAB — BRAIN NATRIURETIC PEPTIDE: B Natriuretic Peptide: 22.7 pg/mL (ref 0.0–100.0)

## 2015-07-18 LAB — D-DIMER, QUANTITATIVE: D-Dimer, Quant: 0.46 ug/mL-FEU (ref 0.00–0.50)

## 2015-07-18 MED ORDER — FUROSEMIDE 80 MG PO TABS
80.0000 mg | ORAL_TABLET | Freq: Two times a day (BID) | ORAL | Status: DC
  Administered 2015-07-18 – 2015-07-20 (×4): 80 mg via ORAL
  Filled 2015-07-18 (×4): qty 1

## 2015-07-18 MED ORDER — ISOSORBIDE MONONITRATE ER 30 MG PO TB24
30.0000 mg | ORAL_TABLET | Freq: Every day | ORAL | Status: DC
  Administered 2015-07-18 – 2015-07-20 (×3): 30 mg via ORAL
  Filled 2015-07-18 (×3): qty 1

## 2015-07-18 MED ORDER — ALBUTEROL SULFATE (2.5 MG/3ML) 0.083% IN NEBU: 2.5000 mg | INHALATION_SOLUTION | Freq: Four times a day (QID) | RESPIRATORY_TRACT | Status: DC | PRN

## 2015-07-18 MED ORDER — CARVEDILOL 25 MG PO TABS
25.0000 mg | ORAL_TABLET | Freq: Two times a day (BID) | ORAL | Status: DC
  Administered 2015-07-18 – 2015-07-20 (×5): 25 mg via ORAL
  Filled 2015-07-18 (×4): qty 1
  Filled 2015-07-18: qty 2

## 2015-07-18 MED ORDER — ONDANSETRON HCL 4 MG/2ML IJ SOLN: 4.0000 mg | Freq: Once | INTRAMUSCULAR | Status: DC

## 2015-07-18 MED ORDER — ONDANSETRON HCL 4 MG/2ML IJ SOLN: 4.0000 mg | Freq: Four times a day (QID) | INTRAMUSCULAR | Status: DC | PRN

## 2015-07-18 MED ORDER — ASPIRIN 81 MG PO CHEW
324.0000 mg | CHEWABLE_TABLET | Freq: Once | ORAL | Status: AC
  Administered 2015-07-18: 324 mg via ORAL
  Filled 2015-07-18: qty 4

## 2015-07-18 MED ORDER — FENOFIBRATE 54 MG PO TABS
54.0000 mg | ORAL_TABLET | Freq: Every day | ORAL | Status: DC
  Administered 2015-07-18 – 2015-07-20 (×3): 54 mg via ORAL
  Filled 2015-07-18 (×3): qty 1

## 2015-07-18 MED ORDER — HEPARIN SODIUM (PORCINE) 5000 UNIT/ML IJ SOLN
5000.0000 [IU] | Freq: Three times a day (TID) | INTRAMUSCULAR | Status: DC
  Administered 2015-07-18 – 2015-07-20 (×5): 5000 [IU] via SUBCUTANEOUS
  Filled 2015-07-18 (×5): qty 1

## 2015-07-18 MED ORDER — HYDRALAZINE HCL 25 MG PO TABS: 37.5000 mg | ORAL_TABLET | Freq: Three times a day (TID) | ORAL | Status: DC

## 2015-07-18 MED ORDER — GUAIFENESIN ER 600 MG PO TB12
1200.0000 mg | ORAL_TABLET | Freq: Two times a day (BID) | ORAL | Status: DC
  Administered 2015-07-18 – 2015-07-20 (×5): 1200 mg via ORAL
  Filled 2015-07-18 (×6): qty 2

## 2015-07-18 MED ORDER — FUROSEMIDE 20 MG PO TABS: 80.0000 mg | ORAL_TABLET | Freq: Two times a day (BID) | ORAL | Status: DC

## 2015-07-18 MED ORDER — GI COCKTAIL ~~LOC~~: 30.0000 mL | Freq: Four times a day (QID) | ORAL | Status: DC | PRN

## 2015-07-18 MED ORDER — ACETAMINOPHEN 325 MG PO TABS: 650.0000 mg | ORAL_TABLET | ORAL | Status: DC | PRN

## 2015-07-18 MED ORDER — HYDRALAZINE HCL 25 MG PO TABS
37.5000 mg | ORAL_TABLET | Freq: Three times a day (TID) | ORAL | Status: DC
  Administered 2015-07-18 – 2015-07-20 (×6): 37.5 mg via ORAL
  Filled 2015-07-18 (×6): qty 2

## 2015-07-18 MED ORDER — POTASSIUM CHLORIDE CRYS ER 20 MEQ PO TBCR
20.0000 meq | EXTENDED_RELEASE_TABLET | Freq: Two times a day (BID) | ORAL | Status: DC
  Administered 2015-07-18 – 2015-07-20 (×4): 20 meq via ORAL
  Filled 2015-07-18 (×5): qty 1

## 2015-07-18 MED ORDER — MORPHINE SULFATE (PF) 4 MG/ML IV SOLN: 4.0000 mg | Freq: Once | INTRAVENOUS | Status: DC

## 2015-07-18 MED ORDER — ALLOPURINOL 300 MG PO TABS
300.0000 mg | ORAL_TABLET | Freq: Every day | ORAL | Status: DC
  Administered 2015-07-18 – 2015-07-20 (×3): 300 mg via ORAL
  Filled 2015-07-18: qty 3
  Filled 2015-07-18 (×2): qty 1

## 2015-07-18 MED ORDER — MORPHINE SULFATE (PF) 2 MG/ML IV SOLN
2.0000 mg | INTRAVENOUS | Status: DC | PRN
  Administered 2015-07-18: 2 mg via INTRAVENOUS
  Filled 2015-07-18: qty 1

## 2015-07-18 MED ORDER — ALBUTEROL SULFATE HFA 108 (90 BASE) MCG/ACT IN AERS: 2.0000 | INHALATION_SPRAY | Freq: Four times a day (QID) | RESPIRATORY_TRACT | Status: DC | PRN

## 2015-07-18 MED ORDER — POTASSIUM CHLORIDE CRYS ER 20 MEQ PO TBCR
40.0000 meq | EXTENDED_RELEASE_TABLET | Freq: Once | ORAL | Status: AC
  Administered 2015-07-18: 40 meq via ORAL
  Filled 2015-07-18: qty 2

## 2015-07-18 MED ORDER — ASPIRIN 325 MG PO TABS
325.0000 mg | ORAL_TABLET | Freq: Every day | ORAL | Status: DC
  Administered 2015-07-19 – 2015-07-20 (×2): 325 mg via ORAL
  Filled 2015-07-18 (×3): qty 1

## 2015-07-18 NOTE — H&P (Signed)
History and Physical    Craig Flynn [id number] DOB: [birth year] DOA: 07/18/2015  Referring MD/NP/PA: Arlean Hopping, P.A. - ED PCP: Mauricio Po, [place] Specialists:   Marijo File, MD - Cardiology Jamal Maes, MD - Nephrology  Patient coming from: Home  Chief Complaint: chest pain   HPI: Craig Flynn is a 55 y.o. male with medical history significant for HTN, Cardiomyopathy and obesity. Patient was asked to the ED for chest pain. Yesterday afternoon patient developed centralized chest pain radiating towards left chest. The pain was severe for approximately 10-15 minutes, unrelated to exertion. Pain was stabbing in nature. Patient had associated dizziness and nausea. Patient called his cardiologist, was advised to come to emergency department but pain spontaneously subsided. Patient slept well without any recurrent pain during the night until around 8:00 this morning. Patient had recurrence of the same type chest pain radiating through to the left associated with dizziness and nausea while at work today. Patient has chronic shortness of breath, worse with exertion. He weighs himself 3 times a week, weight fluctuates a few pounds. Compliant with home medications.   Patient has chronic right knee pain. 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 Patient states he needs surgery but has to lose weight first. He has chronic lower extremity edema, states right lower extremity edema always worse than left  ED Course:  Patient hemodynamically stable. Chest pain spontaneously resolving, now no greater than a 2 out of 10. Point-of-care troponin normal. D-dimer normal.  Right lower extremity ultrasound-preliminary report neg Creatinine 4.07, up from baseline of 3.6 BNP 22.7  Review of Systems: All other reviewed and negative.   Past Medical History  Diagnosis Date  . Hypertension   . Gout   . Other specified disorder of intestines     pt states he had  blockage of intestines - given colostomy  . CHF (congestive heart failure) [facility])     Past Surgical History  Procedure Laterality Date  . Cholecystectomy    . Umbilical hernia repair       reports that he quit smoking about 4 months ago. His smoking use included Cigarettes. He has a 15 pack-year smoking history. He has never used smokeless tobacco. He reports that he does not drink alcohol or use illicit drugs.  No Known Allergies  Family History  Problem Relation Age of Onset  . Stroke Mother   . Pneumonia Father     died of Pneumonia x3  . CAD Neg Hx   . Kidney disease Maternal Grandmother   . Hypertension Maternal Grandmother   . Healthy Maternal Grandfather   . Healthy Paternal Grandmother   . Healthy Paternal Grandfather     Prior to Admission medications   Medication Sig Start Date End Date Taking? Authorizing Provider  albuterol (PROVENTIL HFA;VENTOLIN HFA) 108 (90 BASE) MCG/ACT inhaler Inhale 2 puffs into the lungs every 6 (six) hours as needed for wheezing or shortness of breath. 02/27/15  Yes Reyne Dumas, MD  allopurinol (ZYLOPRIM) 300 MG tablet Take 1 tablet (300 mg total) by mouth daily. 07/11/15  Yes Golden Circle, FNP  aspirin 325 MG tablet Take 1 tablet (325 mg total) by mouth daily. 09/23/14  Yes Ripudeep Krystal Eaton, MD  carvedilol (COREG) 25 MG tablet Take 1 tablet (25 mg total) by mouth 2 (two) times daily. 11/01/14  Yes Minus Breeding, MD  fenofibrate 54 MG tablet Take 1 tablet (54 mg total) by mouth daily. 06/24/15  Yes Jeneen Rinks  Hochrein, MD  furosemide (LASIX) 80 MG tablet Take 1 tablet (80 mg total) by mouth 2 (two) times daily. 02/27/15  Yes Reyne Dumas, MD  guaiFENesin (MUCINEX) 600 MG 12 hr tablet Take 2 tablets (1,200 mg total) by mouth 2 (two) times daily. 02/27/15  Yes Reyne Dumas, MD  hydrALAZINE (APRESOLINE) 25 MG tablet Take 1.5 tablets (37.5 mg total) by mouth 3 (three) times daily. 06/24/15  Yes Minus Breeding, MD  isosorbide mononitrate (IMDUR) 30 MG 24 hr  tablet Take 1 tablet (30 mg total) by mouth daily. 06/24/15  Yes Minus Breeding, MD  potassium chloride SA (K-DUR,KLOR-CON) 20 MEQ tablet Take 20 mEq by mouth 2 (two) times daily.   Yes Historical Provider, MD  Vitamin D, Ergocalciferol, (DRISDOL) 50000 units CAPS capsule Take 2 capsules by mouth once a week. 06/12/15  Yes Historical Provider, MD  tiZANidine (ZANAFLEX) 4 MG tablet Take 1 tablet (4 mg total) by mouth every 8 (eight) hours as needed. 01/15/15   Golden Circle, FNP    Physical Exam: Filed Vitals:   07/18/15 0915 07/18/15 0931 07/18/15 1026 07/18/15 1138  BP: 126/97  126/75 169/80  Pulse: 86  86 78  Temp:      TempSrc:      Resp: 15  15 18   Height:  6\' 2"  (1.88 m)    Weight:  146.421 kg (322 lb 12.8 oz)    SpO2: 100%  95% 100%    Constitutional: NAD, calm, comfortable Filed Vitals:   07/18/15 0915 07/18/15 0931 07/18/15 1026 07/18/15 1138  BP: 126/97  126/75 169/80  Pulse: 86  86 78  Temp:      TempSrc:      Resp: 15  15 18   Height:  6\' 2"  (1.88 m)    Weight:  146.421 kg (322 lb 12.8 oz)    SpO2: 100%  95% 100%   Eyes: PERRL, lids and conjunctivae normal ENMT: Mucous membranes are moist. Posterior pharynx clear of any exudate or lesions.Normal dentition.  Neck: normal, supple, no masses Respiratory: clear to auscultation bilaterally, no wheezing, no crackles. Normal respiratory effort. No accessory muscle use.  Cardiovascular: Regular rate and rhythm, no murmurs / rubs / gallops. 2+ LLE edema, RLE 3+ edema. 2+ pedal pulses.  Abdomen: obese, no masses palpated. No hepatomegaly. Bowel sounds positive.  Musculoskeletal: no clubbing / cyanosis. No joint deformity upper and lower extremities. Good ROM, no contractures. Normal muscle tone.  Skin: no rashes, lesions, ulcers. No induration Neurologic: CN 2-12 grossly intact. Sensation intact, DTR normal. Strength 5/5 in all 4.  Psychiatric: Normal judgment and insight. Alert and oriented x 3. Normal mood.   Labs on  Admission: I have personally reviewed following labs and imaging studies  CBC:  Recent Labs Lab 07/18/15 0839  WBC 5.6  HGB 10.7*  HCT 35.3*  MCV 85.7  PLT 99991111   Basic Metabolic Panel:  Recent Labs Lab 07/18/15 0839  NA 142  K 3.4*  CL 104  CO2 25  GLUCOSE 152*  BUN 34*  CREATININE 4.07*  CALCIUM 9.7   Urine analysis:    Component Value Date/Time   COLORURINE YELLOW 02/26/2015 1939   APPEARANCEUR CLEAR 02/26/2015 1939   LABSPEC 1.016 02/26/2015 1939   PHURINE 5.0 02/26/2015 1939   GLUCOSEU NEGATIVE 02/26/2015 1939   HGBUR NEGATIVE 02/26/2015 1939   HGBUR negative 05/16/2008 0926   BILIRUBINUR NEGATIVE 02/26/2015 1939   KETONESUR NEGATIVE 02/26/2015 1939   PROTEINUR 30* 02/26/2015 1939   UROBILINOGEN 0.2  11/17/2013 0748   NITRITE NEGATIVE 02/26/2015 1939   LEUKOCYTESUR NEGATIVE 02/26/2015 1939   Radiological Exams on Admission: Dg Chest 2 View  07/18/2015  CLINICAL DATA:  Left-sided chest pain for 2 days, initial encounter EXAM: CHEST  2 VIEW COMPARISON:  02/25/2015 FINDINGS: The heart size and mediastinal contours are within normal limits. Both lungs are clear. The visualized skeletal structures are unremarkable. IMPRESSION: No active cardiopulmonary disease. Electronically Signed   By: Inez Catalina M.D.   On: 07/18/2015 09:13    EKG: Independently reviewed. Poor data quality, interpretation may be adversely affected Normal sinus rhythm Prolonged QTc 481 Abnormal ECG  Assessment/Plan Active Problems:   Essential hypertension   Hypokalemia   HTN (hypertension)   Chest pain   Anemia in chronic kidney disease   Chest pain, nonexertional. Chest pain recurrent, admitted in December for same. HEART score 4 (Moderately suspicious, age, risk factors of HTN, smoking, obesity). Differential diagnoses includes ACS, GERD, MSK pain.  EKG poor quality. POC trop normal. No acute findings on CXR.  - Admit to Observation - Telemetry -Cardiology consult. Cardiologist  was considering ischemic workup at time of patient's last  visit there on 06/24/15.  - Cycle troponins - continue Imdur  - A1c & lipid panel - Continue daily aspirin - Gi cocktail prn - Repeat EKG in am   Hypokalemia. K+ 3.1.  -continue home BID K+ -Give additional dose of 32mEq now -am bmet  AKI superimposed on CKD stage 4. Baseline Cr 3.6, up to 4.07 today. Known to Dr. Lorrene Reid -hold today's lasix. Will restart in am -am bmet  Peripheral edema, chronic. He does have a history of grade I diastolic dysfunction on last echo July 2016. Preserved EF of 55-60%. No major weight increases at home. BNP only 22 and no evidence for heart failure on CXR. RLE edema > LLE.  No DVT on preliminary u/s of RLE today and negative dopplers in past. -elevate legs -resume lasix tomorrow (AKI) -daily wt -I&O -heart healthy diet  HTN. SBP up to 169 in ED.  -continue home TID apresoline  Hypertriglyceridemia, 334 July 2016 -Continue home fenofibrate  OSA -CPAP per RT  Tobacco abuse. -RN to provide cessation education  Gout -continue home Zyloprim  DVT prophylaxis: Heparin Code Status: Full code Family Communication: None Disposition Plan: Discharge home in 24-48 hours Consults called: Cardiology Admission status: Observation   Tye Savoy NP [facility] Pager [phone]  If 7PM-7AM, please contact night-coverage [email or web address] Password [facility]  07/18/2015, 11:47 AM    Sa

## 2015-07-18 NOTE — Progress Notes (Signed)
Patient does not wish to wear our CPAP tonight. Has CPAP at home and stated he would bring his in to wear. RT made pt aware that if he changed his mind to call.

## 2015-07-18 NOTE — ED Notes (Signed)
Attempted report 

## 2015-07-18 NOTE — ED Notes (Signed)
Pt transported to Xray. 

## 2015-07-18 NOTE — Consult Note (Signed)
Cardiology Consult    Patient ID: Craig Flynn MRN: [id number], DOB/AGE: 27-May-1960   Admit date: 07/18/2015 Date of Consult: 07/18/2015  Primary Physician: Mauricio Po, [place] Reason for Consult: Chest Pain Primary Cardiologist: Dr. Percival Spanish Requesting Provider: Dr. Eliseo Squires  History of Present Illness    Craig Flynn is a 55 y.o. male with past medical history of HTN, Stage 4 CKD, Gout, Chronic Diastolic CHF, OSA (on CPAP), and tobacco abuse who presents to [facility] ED on 07/18/2015 for evaluation of chest pain.   He called our office yesterday reporting chest pain, shortness of breath, and dizziness for the past 2-3 days.  He was instructed to go to the ED, but declined due to his pain resolving spontaneously.  Reports his pain represented this morning and that prompted him to come in for evaluation. Says he initially feels dizzy and lightheaded, then develops a shooting pain under his left breast. Reports occasional associated nausea and dyspnea. The pain lasts for less than 5 minutes and resolves spontaneously. 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 Has experienced a total of 5-7 episodes in the past two days.  Recently seen by Dr. Percival Spanish on 06/23/2015 for establishment of care in relation to known cardiomyopathy and HTN. Had an echo in 10/2013 which showed an EF of 35-40%. Never followed up with Cardiology as an outpatient. He had a repeat echo in 09/2014 which showed an improved EF of 55-60%.   While in the ED, his initial troponin value has been negative. BNP 22.7. Hgb 10.7. WBC 5.6. Platelets 205. K+ 3.4. Creatinine 4.07 (was 3.44 four months ago). EKG shows NSR, HR 86, with no acute ischemic changes noted. T-wave inversion in the inferior and lateral leads on previous tracings has now mostly resolved. CXR with no acute cardiopulmonary disease. Denies any pain at this current time.  Past Medical History   Past Medical  History  Diagnosis Date  . Hypertension   . Gout   . 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 Cardiomyopathy ([place])     a. 10/2013: EF reduced to 35-40% b. 09/2014: EF improved to 55-60%, Grade 1 DD noted.  . CKD (chronic kidney disease), stage IV ([place])   . OSA (obstructive sleep apnea)     a. on CPAP    Past Surgical History  Procedure Laterality Date  . Cholecystectomy    . Umbilical hernia repair       Allergies  No Known Allergies  Inpatient Medications    . allopurinol  300 mg Oral Daily  . aspirin  325 mg Oral Daily  . carvedilol  25 mg Oral BID  . fenofibrate  54 mg Oral Daily  . furosemide  80 mg Oral BID  . guaiFENesin  1,200 mg Oral BID  . heparin  5,000 Units Subcutaneous Q8H  . hydrALAZINE  37.5 mg Oral TID  . isosorbide mononitrate  30 mg Oral Daily  . potassium chloride SA  20 mEq Oral BID    Family History    Family History  Problem Relation Age of Onset  . Stroke Mother   . Pneumonia Father     died of Pneumonia x3  . CAD Neg Hx   . Kidney disease Maternal Grandmother   . Hypertension Maternal Grandmother   . Healthy Maternal Grandfather   . Healthy Paternal Grandmother   .  Healthy Paternal Grandfather     Social History    Social History   Social History  . Marital Status: Legally Separated    Spouse Name: N/A  . Number of Children: 2  . Years of Education: 12   Occupational History  . Disability     Knees    Social History Main Topics  . Smoking status: Current Every Day Smoker -- 0.50 packs/day for 30 years    Types: Cigarettes  . Smokeless tobacco: Never Used  . Alcohol Use: No  . Drug Use: No  . Sexual Activity: Not on file   Other Topics Concern  . Not on file   Social History Narrative   Fun: Golf, basketball    Denies religious beliefs effecting health care.      Review of Systems    General:  No chills, fever, night sweats or weight changes.    Cardiovascular:  No edema, orthopnea, palpitations, paroxysmal nocturnal dyspnea. Positive for chest pain and dyspnea on exertion.  Dermatological: No rash, lesions/masses Respiratory: No cough, dyspnea Urologic: No hematuria, dysuria Abdominal:   No vomiting, diarrhea, bright red blood per rectum, melena, or hematemesis. Positive for nausea. Neurologic:  No visual changes, wkns, changes in mental status. Positive for dizziness. All other systems reviewed and are otherwise negative except as noted above.  Physical Exam    Blood pressure 136/66, pulse 88, temperature 98.6 F (37 C), temperature source Oral, resp. rate 16, height 6\' 2"  (1.88 m), weight 322 lb 12.8 oz (146.421 kg), SpO2 100 %.  General: Pleasant, obese African American male appearing in NAD Psych: Normal affect. Neuro: Alert and oriented X 3. Moves all extremities spontaneously. HEENT: Normal  Neck: Supple without bruits or JVD. Lungs:  Resp regular and unlabored, CTA without wheezing or rales. Heart: RRR no s3, s4, or murmurs. Abdomen: Soft, non-tender, non-distended, BS + x 4.  Extremities: No clubbing or cyanosis. 1+ lower extremity edema bilaterally. DP/PT/Radials 2+ and equal bilaterally.  Labs    Troponin [facility] Care Test)  Recent Labs  07/18/15 0852  TROPIPOC 0.02   No results for input(s): CKTOTAL, CKMB, TROPONINI in the last 72 hours. Lab Results  Component Value Date   WBC 5.6 07/18/2015   HGB 10.7* 07/18/2015   HCT 35.3* 07/18/2015   MCV 85.7 07/18/2015   PLT 205 07/18/2015     Recent Labs Lab 07/18/15 0839  NA 142  K 3.4*  CL 104  CO2 25  BUN 34*  CREATININE 4.07*  CALCIUM 9.7  GLUCOSE 152*   Lab Results  Component Value Date   CHOL 148 09/22/2014   HDL 23* 09/22/2014   LDLCALC 60 09/22/2014   TRIG 324* 09/22/2014   Lab Results  Component Value Date   DDIMER 0.46 07/18/2015     Radiology Studies    Dg Chest 2 View: 07/18/2015  CLINICAL DATA:  Left-sided chest pain for 2  days, initial encounter EXAM: CHEST  2 VIEW COMPARISON:  02/25/2015 FINDINGS: The heart size and mediastinal contours are within normal limits. Both lungs are clear. The visualized skeletal structures are unremarkable. IMPRESSION: No active cardiopulmonary disease. Electronically Signed   By: Inez Catalina M.D.   On: 07/18/2015 09:13    EKG & Cardiac Imaging    EKG:  NSR, HR 86, with no acute ischemic changes noted. T-wave inversion in the inferior and lateral leads on previous tracings now improved.  Echocardiogram: 09/22/2014 Study Conclusions - Left ventricle: The cavity size was normal. There  was mild  concentric hypertrophy. Systolic function was normal. The  estimated ejection fraction was in the range of 55% to 60%.  Images were inadequate for LV wall motion assessment. However,  there were no gross abnormalities noted on available images.  Doppler parameters are consistent with abnormal left ventricular  relaxation (grade 1 diastolic dysfunction). - Left atrium: The atrium was mildly dilated.  Impressions: - When compared to the report dated 11/17/13, left ventricular  systolic function has since normalized.  Assessment & Plan    1. Chest Pain with low-risk of ACS - reports having episodes of dizziness and lightheadedness followed by a shooting pain under his left breast. Occurs at rest, lasting less than 5 minutes, then resolving spontaneously.  - initial troponin negative and EKG without acute ischemic changes. Would cycle troponin values.  - with his Stage 4 CKD, he is not an ideal cardiac catheterization candidate. Voices strong feelings about wanting to avoid HD. Overall, his symptoms sound atypical but with his cardiac risk factors and upcoming knee replacement surgery (for which he will need cardiac clearance) further evaluation is indicated. Would recommend a 2-day nuclear stress test. Thoroughly reviewed with the patient and he is in agreement for this. Would need to  be a Lexiscan as he has significant knee pain with prolonged walking and I doubt his ability to reach target HR due to this.  2. HTN - BP has been 126/66 - 169/97 while in the ED. - continue Coreg 25mg  BID, Hydralazine 37.5mg  TID, and Imdur 30mg  daily.   3. HLD - reported in history. Not currently on statin therapy. Fenofibrate is listed. - repeat Lipid Panel pending.  4. Chronic Diastolic CHF - echo in Q000111Q showed an EF of 55-60% with Grade 1 DD. Has lower extremity edema on exam. CXR negative and BNP normal at 22. Does not weigh daily. - PO Lasix held today in setting of AKI.  - continue BB   5. OSA - on CPAP  6. Acute on Chronic Stage 4 CKD - Creatinine at 4.07 (was 3.44 four months ago). Not on HD. - followed by Dr. Lorrene Reid.  7. Tobacco Abuse - cessation advised.  Signed, Erma Heritage, PA-C 07/18/2015, 2:45 PM Pager: [phone]

## 2015-07-18 NOTE — ED Provider Notes (Signed)
CSN: [id number]     Arrival date & time 07/18/15  [id number] History   First MD Initiated Contact with Patient 07/18/15 [phone]     Chief Complaint  Patient presents with  . Chest Pain     (Consider location/radiation/quality/duration/timing/severity/associated sxs/prior Treatment) HPI   Craig Flynn is a 55 y.o. male, with a history of CHF and hypertension, presenting to the ED with 2 episodes of chest pain in the last 12 hours. Patient states that he was at rest last night when he began to have central chest pain radiating to the left. This pain was accompanied by dizziness, shortness of breath, and nausea. Pain lasted for about 15-20 minutes and then resolved. This morning patient had sudden recurrence of this same pain also accompanied by dizziness, shortness of breath, and nausea. His current pain is rated 8 out of 10, sharp, located mostly in the left chest. Patient also complains of unilateral right lower leg swelling, increased over the last 2 weeks. Patient denies LOC, trauma, vomiting, current increased shortness of breath, fever/chills, cough, or any other complaints. Further denies history of DVT/PE, cancer, prolonged immobilization or trauma. Patient's only mode of anticoagulation is 325 mg of aspirin daily, but has not taken it this morning. Patient's cardiologist is Dr. Percival Spanish.   Past Medical History  Diagnosis Date  . Hypertension   . Gout   . 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 CHF (congestive heart failure) [facility])    Past Surgical History  Procedure Laterality Date  . Cholecystectomy    . Umbilical hernia repair     Family History  Problem Relation Age of Onset  . Stroke Mother   . Pneumonia Father     died of Pneumonia x3  . CAD Neg Hx   . Kidney disease Maternal Grandmother   . Hypertension Maternal Grandmother   . Healthy Maternal Grandfather   . Healthy Paternal Grandmother   . Healthy Paternal  Grandfather    Social History  Substance Use Topics  . Smoking status: Former Smoker -- 0.50 packs/day for 30 years    Types: Cigarettes    Quit date: 02/25/2015  . Smokeless tobacco: Never Used  . Alcohol Use: No    Review of Systems  Constitutional: Negative for fever and chills.  Respiratory: Positive for shortness of breath (resolved). Negative for cough.   Cardiovascular: Positive for chest pain and leg swelling.  All other systems reviewed and are negative.     Allergies  Review of patient's allergies indicates no known allergies.  Home Medications   Prior to Admission medications   Medication Sig Start Date End Date Taking? Authorizing Provider  albuterol (PROVENTIL HFA;VENTOLIN HFA) 108 (90 BASE) MCG/ACT inhaler Inhale 2 puffs into the lungs every 6 (six) hours as needed for wheezing or shortness of breath. 02/27/15  Yes Reyne Dumas, MD  allopurinol (ZYLOPRIM) 300 MG tablet Take 1 tablet (300 mg total) by mouth daily. 07/11/15  Yes Golden Circle, FNP  aspirin 325 MG tablet Take 1 tablet (325 mg total) by mouth daily. 09/23/14  Yes Ripudeep Krystal Eaton, MD  carvedilol (COREG) 25 MG tablet Take 1 tablet (25 mg total) by mouth 2 (two) times daily. 11/01/14  Yes Minus Breeding, MD  fenofibrate 54 MG tablet Take 1 tablet (54 mg total) by mouth daily. 06/24/15  Yes Minus Breeding, MD  furosemide (LASIX) 80 MG tablet Take 1 tablet (80 mg  total) by mouth 2 (two) times daily. 02/27/15  Yes Reyne Dumas, MD  guaiFENesin (MUCINEX) 600 MG 12 hr tablet Take 2 tablets (1,200 mg total) by mouth 2 (two) times daily. 02/27/15  Yes Reyne Dumas, MD  hydrALAZINE (APRESOLINE) 25 MG tablet Take 1.5 tablets (37.5 mg total) by mouth 3 (three) times daily. 06/24/15  Yes Minus Breeding, MD  isosorbide mononitrate (IMDUR) 30 MG 24 hr tablet Take 1 tablet (30 mg total) by mouth daily. 06/24/15  Yes Minus Breeding, MD  potassium chloride SA (K-DUR,KLOR-CON) 20 MEQ tablet Take 20 mEq by mouth 2 (two) times daily.    Yes Historical Provider, MD  Vitamin D, Ergocalciferol, (DRISDOL) 50000 units CAPS capsule Take 2 capsules by mouth once a week. 06/12/15  Yes Historical Provider, MD  predniSONE (STERAPRED UNI-PAK 48 TAB) 10 MG (48) TBPK tablet Take 6 tablets x 4 days, 4 tablets x 4 days, 2 tablets x 2 days 06/20/15   Golden Circle, FNP  tiZANidine (ZANAFLEX) 4 MG tablet Take 1 tablet (4 mg total) by mouth every 8 (eight) hours as needed. 01/15/15   Golden Circle, FNP   BP 161/75 mmHg  Pulse 89  Temp(Src) 98.6 F (37 C) (Oral)  Resp 18  Ht 6\' 2"  (1.88 m)  SpO2 100% Physical Exam  Constitutional: He appears well-developed and well-nourished. No distress.  HENT:  Head: Normocephalic and atraumatic.  Eyes: Conjunctivae are normal. Pupils are equal, round, and reactive to light.  Neck: Neck supple.  Cardiovascular: Normal rate, regular rhythm, normal heart sounds and intact distal pulses.   Pulmonary/Chest: Effort normal and breath sounds normal. No respiratory distress. He exhibits no tenderness.  Abdominal: Soft. There is no tenderness. There is no guarding.  Musculoskeletal: He exhibits no edema or tenderness.  Patient's right lower leg is noted to be significantly more swollen than the left.  Lymphadenopathy:    He has no cervical adenopathy.  Neurological: He is alert.  Skin: Skin is warm and dry. He is not diaphoretic.  Psychiatric: He has a normal mood and affect. His behavior is normal.  Nursing note and vitals reviewed.   ED Course  Procedures (including critical care time) Labs Review Labs Reviewed  BASIC METABOLIC PANEL - Abnormal; Notable for the following:    Potassium 3.4 (*)    Glucose, Bld 152 (*)    BUN 34 (*)    Creatinine, Ser 4.07 (*)    GFR calc non Af Amer 15 (*)    GFR calc Af Amer 18 (*)    All other components within normal limits  CBC - Abnormal; Notable for the following:    RBC 4.12 (*)    Hemoglobin 10.7 (*)    HCT 35.3 (*)    RDW 16.2 (*)    All other  components within normal limits  BRAIN NATRIURETIC PEPTIDE  D-DIMER, QUANTITATIVE (NOT AT [facility])  HEMOGLOBIN A1C  I-STAT TROPOININ, ED    Imaging Review Dg Chest 2 View  07/18/2015  CLINICAL DATA:  Left-sided chest pain for 2 days, initial encounter EXAM: CHEST  2 VIEW COMPARISON:  02/25/2015 FINDINGS: The heart size and mediastinal contours are within normal limits. Both lungs are clear. The visualized skeletal structures are unremarkable. IMPRESSION: No active cardiopulmonary disease. Electronically Signed   By: Inez Catalina M.D.   On: 07/18/2015 09:13   I have personally reviewed and evaluated these images and lab results as part of my medical decision-making.   EKG Interpretation   Date/Time:  Thursday July 18 2015 08:44:02 EDT Ventricular Rate:  86 PR Interval:  178 QRS Duration: 100 QT Interval:  402 QTC Calculation: 481 R Axis:   39 Text Interpretation:  Poor data quality, interpretation may be  adversely affected Normal sinus rhythm Prolonged QT Abnormal ECG improved  lateral and inferior t waves Confirmed by [facility] MD, [name] [phone]) on  07/18/2015 9:06:23 AM      Medications  allopurinol (ZYLOPRIM) tablet 300 mg (not administered)  carvedilol (COREG) tablet 25 mg (not administered)  fenofibrate tablet 54 mg (not administered)  isosorbide mononitrate (IMDUR) 24 hr tablet 30 mg (not administered)  potassium chloride SA (K-DUR,KLOR-CON) CR tablet 20 mEq (not administered)  albuterol (PROVENTIL HFA;VENTOLIN HFA) 108 (90 Base) MCG/ACT inhaler 2 puff (not administered)  aspirin tablet 325 mg (not administered)  guaiFENesin (MUCINEX) 12 hr tablet 1,200 mg (not administered)  acetaminophen (TYLENOL) tablet 650 mg (not administered)  ondansetron (ZOFRAN) injection 4 mg (not administered)  heparin injection 5,000 Units (not administered)  morphine 2 MG/ML injection 2 mg (not administered)  gi cocktail (Maalox,Lidocaine,Donnatal) (not administered)  furosemide (LASIX) tablet  80 mg (not administered)  hydrALAZINE (APRESOLINE) tablet 37.5 mg (not administered)  aspirin chewable tablet 324 mg (324 mg Oral Given 07/18/15 0942)   Orders Placed This Encounter  Procedures  . DG Chest 2 View  . Basic metabolic panel  . CBC  . Brain natriuretic peptide  . D-dimer, quantitative (not at [facility])  . Lipid panel  . Hemoglobin A1c  . Diet Heart Room service appropriate?: Yes; Fluid consistency:: Thin  . Saline Lock IV, Maintain IV access  . Height and weight  . Vital signs with O2 sat q4 hours x 24 hours, then q shift  . Cardiac monitoring  . Contact MD/NPP for elevated troponin or MB and/or MB%  . RN may order Cardiology PRN Orders (through additional orders) for the following patient needs; indigestion (Maalox)), cough (Robitussin DM), constipation (MOM), diarrhea (Imodium), hemorrhoids (Tucks), or minor skin irritation (Hydrocortisone Cream)  . Heart Score  . Activity as tolerated  . Elevate Legs  . Full code  . Consult for [facility] Admission  . Oxygen therapy Keep 02 saturation: 94 % or above  . CPAP  . I-stat troponin, ED  . EKG 12-Lead  . ED EKG within 10 minutes  . EKG 12-Lead  . EKG 12-Lead (at 6am)  . Insert peripheral IV  . Place in observation (patient's expected length of stay will be less than 2 midnights)    MDM   Final diagnoses:  Chest pain, unspecified chest pain type    [name]   Findings and plan of care discussed with Merrily Pew, MD. Dr. Dayna Barker personally evaluated and examined this patient.   Patient's presentation is suspicious for ACS, specifically angina, versus PE/DVT. HEART score is 5, indicating moderate risk for a cardiac event. Wells criteria score is 3, indicating moderate risk for PE. Patient's latest echo was performed in July 2016 and showed no acute abnormalities. Troponin and d-dimer are negative. Patient's hemoglobin is somewhat difficult decreased from previous values, however, he has had a similar value  in the past. Upon reevaluation, patient states that his pain is now a 3 out of 10. Patient declined pain management. Admission for observation was discussed with the patient, who agreed with the plan. 11:28 AM Spoke with Nevin Bloodgood from [facility]. Will come down to see the patient and place admission orders. Patient admitted to telemetry without incident.   Filed Vitals:  07/18/15 1026 07/18/15 1100 07/18/15 1138 07/18/15 1200  BP: 126/75 169/80 169/80 147/84  Pulse: 86 77 78 80  Temp:      TempSrc:      Resp: 15 19 18 20   Height:      Weight:      SpO2: 95% 97% 100% 100%     Lorayne Bender, PA-C 07/18/15 1249  Merrily Pew, MD 07/18/15 1647

## 2015-07-18 NOTE — ED Notes (Signed)
Pt reports central chest pain that radiates to left chest that started yesterday but eased off. Pt states that he woke this morning with the pain again as well and dizziness/n/v.

## 2015-07-18 NOTE — Progress Notes (Signed)
*  Preliminary Results* Right lower extremity venous duplex completed. Right lower extremity is negative for deep vein thrombosis. There is no evidence of right Baker's cyst.  07/18/2015 10:24 AM  Maudry Mayhew, RVT, RDCS, RDMS

## 2015-07-18 NOTE — ED Notes (Signed)
Patient transported to X-ray 

## 2015-07-19 ENCOUNTER — Other Ambulatory Visit: Payer: Self-pay

## 2015-07-19 ENCOUNTER — Observation Stay (HOSPITAL_COMMUNITY): Payer: BLUE CROSS/BLUE SHIELD

## 2015-07-19 DIAGNOSIS — D631 Anemia in chronic kidney disease: Secondary | ICD-10-CM | POA: Diagnosis not present

## 2015-07-19 DIAGNOSIS — I10 Essential (primary) hypertension: Secondary | ICD-10-CM | POA: Diagnosis not present

## 2015-07-19 DIAGNOSIS — R079 Chest pain, unspecified: Secondary | ICD-10-CM | POA: Diagnosis not present

## 2015-07-19 DIAGNOSIS — E785 Hyperlipidemia, unspecified: Secondary | ICD-10-CM | POA: Diagnosis not present

## 2015-07-19 DIAGNOSIS — I5022 Chronic systolic (congestive) heart failure: Secondary | ICD-10-CM | POA: Diagnosis not present

## 2015-07-19 DIAGNOSIS — N184 Chronic kidney disease, stage 4 (severe): Secondary | ICD-10-CM | POA: Diagnosis not present

## 2015-07-19 DIAGNOSIS — N189 Chronic kidney disease, unspecified: Secondary | ICD-10-CM | POA: Diagnosis not present

## 2015-07-19 DIAGNOSIS — E876 Hypokalemia: Secondary | ICD-10-CM | POA: Diagnosis not present

## 2015-07-19 DIAGNOSIS — G4733 Obstructive sleep apnea (adult) (pediatric): Secondary | ICD-10-CM | POA: Diagnosis not present

## 2015-07-19 DIAGNOSIS — R0789 Other chest pain: Secondary | ICD-10-CM | POA: Diagnosis not present

## 2015-07-19 LAB — BASIC METABOLIC PANEL
Anion gap: 12 (ref 5–15)
BUN: 34 mg/dL — ABNORMAL HIGH (ref 6–20)
CO2: 22 mmol/L (ref 22–32)
Calcium: 9.1 mg/dL (ref 8.9–10.3)
Chloride: 107 mmol/L (ref 101–111)
Creatinine, Ser: 3.77 mg/dL — ABNORMAL HIGH (ref 0.61–1.24)
GFR calc Af Amer: 19 mL/min — ABNORMAL LOW (ref >60)
GFR calc non Af Amer: 17 mL/min — ABNORMAL LOW (ref >60)
Glucose, Bld: 103 mg/dL — ABNORMAL HIGH (ref 65–99)
Potassium: 3.8 mmol/L (ref 3.5–5.1)
Sodium: 141 mmol/L (ref 135–145)

## 2015-07-19 LAB — CBC
HCT: 32.3 % — ABNORMAL LOW (ref 39.0–52.0)
Hemoglobin: 10 g/dL — ABNORMAL LOW (ref 13.0–17.0)
MCH: 26.7 pg (ref 26.0–34.0)
MCHC: 31 g/dL (ref 30.0–36.0)
MCV: 86.4 fL (ref 78.0–100.0)
Platelets: 207 10*3/uL (ref 150–400)
RBC: 3.74 MIL/uL — ABNORMAL LOW (ref 4.22–5.81)
RDW: 16.1 % — ABNORMAL HIGH (ref 11.5–15.5)
WBC: 5.5 10*3/uL (ref 4.0–10.5)

## 2015-07-19 LAB — LIPID PANEL
Cholesterol: 167 mg/dL (ref 0–200)
HDL: 19 mg/dL — ABNORMAL LOW (ref >40)
LDL Cholesterol: 105 mg/dL — ABNORMAL HIGH (ref 0–99)
Total CHOL/HDL Ratio: 8.8 RATIO
Triglycerides: 215 mg/dL — ABNORMAL HIGH (ref <150)
VLDL: 43 mg/dL — ABNORMAL HIGH (ref 0–40)

## 2015-07-19 LAB — HEMOGLOBIN A1C
Hgb A1c MFr Bld: 7.2 % — ABNORMAL HIGH (ref 4.8–5.6)
Mean Plasma Glucose: 160 mg/dL

## 2015-07-19 MED ORDER — REGADENOSON 0.4 MG/5ML IV SOLN
0.4000 mg | Freq: Once | INTRAVENOUS | Status: AC
  Administered 2015-07-19: 0.4 mg via INTRAVENOUS
  Filled 2015-07-19: qty 5

## 2015-07-19 MED ORDER — REGADENOSON 0.4 MG/5ML IV SOLN
INTRAVENOUS | Status: AC
  Administered 2015-07-19: 0.4 mg via INTRAVENOUS
  Filled 2015-07-19: qty 5

## 2015-07-19 MED ORDER — TECHNETIUM TC 99M SESTAMIBI - CARDIOLITE
30.0000 | Freq: Once | INTRAVENOUS | Status: AC | PRN
  Administered 2015-07-19: 10:00:00 30 via INTRAVENOUS

## 2015-07-19 MED ORDER — HYDROCODONE-ACETAMINOPHEN 5-325 MG PO TABS
1.0000 | ORAL_TABLET | ORAL | Status: DC | PRN
  Administered 2015-07-19 (×2): 1 via ORAL
  Filled 2015-07-19 (×3): qty 1

## 2015-07-19 NOTE — Progress Notes (Signed)
RT NOTE:    Pt refused our CPAP last night, was supposed to bring home CPAP in, however, he did not do this. 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 Pt understands to call if he changes his mind.

## 2015-07-19 NOTE — Progress Notes (Signed)
Pt requesting something else for knee pain.  States morphine and tylenol do not work.  MD text/paged.

## 2015-07-19 NOTE — Progress Notes (Signed)
Subjective: Slight focus CP   Objective: Vital signs in last 24 hours: Temp:  [98.2 F (36.8 C)-99.3 F (37.4 C)] 98.2 F (36.8 C) (04/28 0738) Pulse Rate:  [77-90] 87 (04/28 0907) Resp:  [15-20] 18 (04/28 0738) BP: (113-169)/(57-84) 137/76 mmHg (04/28 0907) SpO2:  [95 %-100 %] 98 % (04/28 0738) Weight:  [320 lb 12.8 oz (145.514 kg)-322 lb 9.6 oz (146.33 kg)] 322 lb 9.6 oz (146.33 kg) (04/28 0604) Last BM Date: 07/18/15  Intake/Output from previous day: 04/27 0701 - 04/28 0700 In: 240 [P.O.:240] Out: 225 [Urine:225] Intake/Output this shift:    Medications Scheduled Meds: . allopurinol  300 mg Oral Daily  . aspirin  325 mg Oral Daily  . carvedilol  25 mg Oral BID  . fenofibrate  54 mg Oral Daily  . furosemide  80 mg Oral BID  . guaiFENesin  1,200 mg Oral BID  . heparin  5,000 Units Subcutaneous Q8H  . hydrALAZINE  37.5 mg Oral TID  . isosorbide mononitrate  30 mg Oral Daily  . potassium chloride SA  20 mEq Oral BID   Continuous Infusions:  PRN Meds:.acetaminophen, albuterol, gi cocktail, morphine injection, ondansetron (ZOFRAN) IV  PE: General appearance: alert, cooperative and no distress Lungs: clear to auscultation bilaterally Heart: regular rate and rhythm, S1, S2 normal, no murmur, click, rub or gallop Extremities: 2+ right LEE Pulses: 2+ and symmetric Skin: Warm and dry Neurologic: Grossly normal  Lab Results:   Recent Labs  07/18/15 0839 07/19/15 0243  WBC 5.6 5.5  HGB 10.7* 10.0*  HCT 35.3* 32.3*  PLT 205 207   BMET  Recent Labs  07/18/15 0839 07/19/15 0243  NA 142 141  K 3.4* 3.8  CL 104 107  CO2 25 22  GLUCOSE 152* 103*  BUN 34* 34*  CREATININE 4.07* 3.77*  CALCIUM 9.7 9.1   PT/INR No results for input(s): LABPROT, INR in the last 72 hours. Cholesterol  Recent Labs  07/19/15 0243  CHOL 167   Lipid Panel     Component Value Date/Time   CHOL 167 07/19/2015 0243   TRIG 215* 07/19/2015 0243   TRIG 98 03/03/2006 0910     HDL 19* 07/19/2015 0243   CHOLHDL 8.8 07/19/2015 0243   CHOLHDL 5.5 CALC 03/03/2006 0910   VLDL 43* 07/19/2015 0243   LDLCALC 105* 07/19/2015 0243   Lipid Panel     Component Value Date/Time   CHOL 167 07/19/2015 0243   TRIG 215* 07/19/2015 0243   TRIG 98 03/03/2006 0910   HDL 19* 07/19/2015 0243   CHOLHDL 8.8 07/19/2015 0243   CHOLHDL 5.5 CALC 03/03/2006 0910   VLDL 43* 07/19/2015 0243   LDLCALC 105* 07/19/2015 0243       Assessment/Plan    Active Problems:   Essential hypertension   Hypokalemia   HTN (hypertension)   CKD (chronic kidney disease) stage 4, GFR 15-29 ml/min (HCC)   Chest pain   Anemia in chronic kidney disease  . Chest Pain with low-risk of ACS Ruled out for MI.  Lexiscan completed without complications.  Results pending.  2. HTN  Stable continue Coreg 25mg  BID, Hydralazine 37.5mg  TID, and Imdur 30mg  daily.   3. HLD LDL 105  4. Chronic Diastolic CHF - echo in Q000111Q showed an EF of 55-60% with Grade 1 DD. Has lower extremity edema on exam on the right LEE.  Wears compression sock at home.  CXR negative and BNP normal at 22. Does not weigh daily. -  PO Lasix held in setting of AKI. SCr improved to 3.77 - continue BB   5. OSA   - on CPAP     Morrell Fluke PA-C 07/19/2015 9:47 AM

## 2015-07-19 NOTE — Progress Notes (Signed)
Patient refused bed alarm. Will continue to monitor.  

## 2015-07-19 NOTE — Progress Notes (Signed)
Patient ID: Craig Flynn, male   DOB: 11-Apr-1960, 55 y.o.   MRN: [id number]    PROGRESS NOTE    MURVIN DUGGAN  [id number] DOB: [birth year] DOA: 07/18/2015  PCP: Mauricio Po, FNP   Outpatient Specialists:   Brief Narrative:  55 y.o. male with  HTN, Cardiomyopathy and obesity, presented to [facility] ED for evaluation of chest pain, radiating the left shoulder and arm. He called his cardiologist who referred him to the ED.  Assessment & Plan: Chest Pain with low-risk of ACS - initial troponin negative and EKG without acute ischemic changes - CE's negative so far - cardiology team following, we appreciate assistance and will continue to follow up on recommendations  - disposition pending Myoview test results   HTN,  Essential  - reasonable inpatient control  - continue Coreg 25mg  BID, Hydralazine 37.5mg  TID, and Imdur 30mg  daily.   HLD - reported in history. Not currently on statin therapy. Fenofibrate is listed. - Lipid Panel pending.  Chronic Diastolic CHF - ECHO in Q000111Q showed an EF of 55-60% with Grade 1 DD.  - on Lasix, weight is 146 kg this AM, monitor daily weights, strict I/O - continue BB   OSA, morbid obesity due to excess calories  - on CPAP - Body mass index is 41.4 kg/(m^2).  Acute on Chronic Stage 4 CKD - followed by Dr. Lorrene Reid. - Cr trending down   Tobacco Abuse - cessation advised.  DVT prophylaxis: Heparin SQ Code Status: Full Family Communication: Patient at bedside  Disposition Plan: Once Myoview results are back   Consultants:   Cardiology   Procedures:   Myoview   Antimicrobials:   None  Subjective: Reports feeling better   Objective: Filed Vitals:   07/18/15 1535 07/18/15 2046 07/19/15 0207 07/19/15 0604  BP:  113/57 120/57 125/63  Pulse:  88 90 83  Temp:  99 F (37.2 C) 99.3 F (37.4 C) 98.3 F (36.8 C)  TempSrc:  Oral Oral Oral  Resp:  17 18 17   Height:      Weight: 145.514 kg (320 lb 12.8 oz)   146.33 kg (322 lb 9.6  oz)  SpO2:  97% 98% 96%    Intake/Output Summary (Last 24 hours) at 07/19/15 0651 Last data filed at 07/18/15 2047  Gross per 24 hour  Intake    240 ml  Output    225 ml  Net     15 ml   Filed Weights   07/18/15 0931 07/18/15 1535 07/19/15 0604  Weight: 146.421 kg (322 lb 12.8 oz) 145.514 kg (320 lb 12.8 oz) 146.33 kg (322 lb 9.6 oz)    Examination:  General exam: Appears calm and comfortable  Respiratory system: Clear to auscultation. Respiratory effort normal. Cardiovascular system: S1 & S2 heard, RRR. No JVD, murmurs, rubs, gallops or clicks. No pedal edema. Gastrointestinal system: Abdomen is nondistended, soft and nontender.  Central nervous system: Alert and oriented. No focal neurological deficits. Extremities: Symmetric 5 x 5 power. 2+ R LEE Skin: No rashes, lesions or ulcers Psychiatry: Judgement and insight appear normal. Mood & affect appropriate.   Data Reviewed: I have personally reviewed following labs and imaging studies  CBC:  Recent Labs Lab 07/18/15 0839 07/19/15 0243  WBC 5.6 5.5  HGB 10.7* 10.0*  HCT 35.3* 32.3*  MCV 85.7 86.4  PLT 205 [id number]   Basic Metabolic Panel:  Recent Labs Lab 07/18/15 0839 07/19/15 0243  NA 142 141  K 3.4* 3.8  CL 104 107  CO2 25 22  GLUCOSE 152* 103*  BUN 34* 34*  CREATININE 4.07* 3.77*  CALCIUM 9.7 9.1   Cardiac Enzymes:  Recent Labs Lab 07/18/15 1645 07/18/15 2157  TROPONINI <0.03 <0.03   HbA1C:  Recent Labs  07/18/15 1547  HGBA1C 7.2*   Lipid Profile:  Recent Labs  07/19/15 0243  CHOL 167  HDL 19*  LDLCALC 105*  TRIG 215*  CHOLHDL 8.8   Urine analysis:    Component Value Date/Time   COLORURINE YELLOW 02/26/2015 1939   APPEARANCEUR CLEAR 02/26/2015 1939   LABSPEC 1.016 02/26/2015 1939   PHURINE 5.0 02/26/2015 1939   GLUCOSEU NEGATIVE 02/26/2015 1939   HGBUR NEGATIVE 02/26/2015 1939   HGBUR negative 05/16/2008 0926   BILIRUBINUR NEGATIVE 02/26/2015 1939   KETONESUR NEGATIVE  02/26/2015 1939   PROTEINUR 30* 02/26/2015 1939   UROBILINOGEN 0.2 11/17/2013 0748   NITRITE NEGATIVE 02/26/2015 1939   LEUKOCYTESUR NEGATIVE 02/26/2015 1939    Radiology Studies: Dg Chest 2 View 07/18/2015   No active cardiopulmonary disease.   Scheduled Meds: . allopurinol  300 mg Oral Daily  . aspirin  325 mg Oral Daily  . carvedilol  25 mg Oral BID  . fenofibrate  54 mg Oral Daily  . furosemide  80 mg Oral BID  . guaiFENesin  1,200 mg Oral BID  . heparin  5,000 Units Subcutaneous Q8H  . hydrALAZINE  37.5 mg Oral TID  . isosorbide mononitrate  30 mg Oral Daily  . potassium chloride SA  20 mEq Oral BID   Continuous Infusions:  Time spent: 20 minutes   Faye Ramsay, MD [facility] Pager [phone]  If 7PM-7AM, please contact night-coverage [email or web address] Password [facility] 07/19/2015, 6:51 AM

## 2015-07-20 DIAGNOSIS — N184 Chronic kidney disease, stage 4 (severe): Secondary | ICD-10-CM | POA: Diagnosis not present

## 2015-07-20 DIAGNOSIS — Z01810 Encounter for preprocedural cardiovascular examination: Secondary | ICD-10-CM | POA: Diagnosis not present

## 2015-07-20 DIAGNOSIS — R9439 Abnormal result of other cardiovascular function study: Secondary | ICD-10-CM | POA: Diagnosis not present

## 2015-07-20 DIAGNOSIS — D631 Anemia in chronic kidney disease: Secondary | ICD-10-CM

## 2015-07-20 DIAGNOSIS — N189 Chronic kidney disease, unspecified: Secondary | ICD-10-CM | POA: Diagnosis not present

## 2015-07-20 DIAGNOSIS — R0789 Other chest pain: Secondary | ICD-10-CM | POA: Diagnosis not present

## 2015-07-20 DIAGNOSIS — I10 Essential (primary) hypertension: Secondary | ICD-10-CM | POA: Diagnosis not present

## 2015-07-20 DIAGNOSIS — E785 Hyperlipidemia, unspecified: Secondary | ICD-10-CM

## 2015-07-20 DIAGNOSIS — R079 Chest pain, unspecified: Secondary | ICD-10-CM | POA: Diagnosis not present

## 2015-07-20 LAB — NM MYOCAR MULTI W/SPECT W/WALL MOTION / EF
Peak HR: 100 {beats}/min
Rest HR: 85 {beats}/min

## 2015-07-20 LAB — CBC
HCT: 32.4 % — ABNORMAL LOW (ref 39.0–52.0)
Hemoglobin: 9.6 g/dL — ABNORMAL LOW (ref 13.0–17.0)
MCH: 25.6 pg — ABNORMAL LOW (ref 26.0–34.0)
MCHC: 29.6 g/dL — ABNORMAL LOW (ref 30.0–36.0)
MCV: 86.4 fL (ref 78.0–100.0)
Platelets: 223 10*3/uL (ref 150–400)
RBC: 3.75 MIL/uL — ABNORMAL LOW (ref 4.22–5.81)
RDW: 16.2 % — ABNORMAL HIGH (ref 11.5–15.5)
WBC: 5 10*3/uL (ref 4.0–10.5)

## 2015-07-20 LAB — BASIC METABOLIC PANEL
Anion gap: 12 (ref 5–15)
BUN: 31 mg/dL — ABNORMAL HIGH (ref 6–20)
CO2: 24 mmol/L (ref 22–32)
Calcium: 9.2 mg/dL (ref 8.9–10.3)
Chloride: 105 mmol/L (ref 101–111)
Creatinine, Ser: 3.55 mg/dL — ABNORMAL HIGH (ref 0.61–1.24)
GFR calc Af Amer: 21 mL/min — ABNORMAL LOW (ref >60)
GFR calc non Af Amer: 18 mL/min — ABNORMAL LOW (ref >60)
Glucose, Bld: 114 mg/dL — ABNORMAL HIGH (ref 65–99)
Potassium: 3.7 mmol/L (ref 3.5–5.1)
Sodium: 141 mmol/L (ref 135–145)

## 2015-07-20 MED ORDER — HYDROCODONE-ACETAMINOPHEN 5-325 MG PO TABS: 1.0000 | ORAL_TABLET | ORAL | Status: DC | PRN

## 2015-07-20 MED ORDER — ATORVASTATIN CALCIUM 40 MG PO TABS: 40.0000 mg | ORAL_TABLET | Freq: Every day | ORAL | Status: DC

## 2015-07-20 MED ORDER — TECHNETIUM TC 99M SESTAMIBI GENERIC - CARDIOLITE
30.0000 | Freq: Once | INTRAVENOUS | Status: AC | PRN
  Administered 2015-07-20: 30 via INTRAVENOUS

## 2015-07-20 NOTE — Progress Notes (Signed)
Patient ID: Craig Flynn, male   DOB: 12-22-1960, 55 y.o.   MRN: [id number]    PROGRESS NOTE    Craig Flynn  [id number] DOB: April 05, 1960 DOA: 07/18/2015  PCP: Craig Po, FNP   Outpatient Specialists:   Brief Narrative:  55 y.o. male with  HTN, Cardiomyopathy and obesity, presented to [facility] ED for evaluation of chest pain, radiating the left shoulder and arm. He called his cardiologist who referred him to the ED.  Assessment & Plan: Chest Pain with low-risk of ACS - initial troponin negative and EKG without acute ischemic changes - CE's negative so far - cardiology team following, we appreciate assistance and will continue to follow up on recommendations  - disposition pending Myoview test results, possibly today   HTN,  Essential  - continue Coreg 25mg  BID, Hydralazine 37.5mg  TID, and Imdur 30mg  daily.   HLD - reported in history. Not currently on statin therapy. Fenofibrate is listed. - Lipid Panel pending.  Chronic Diastolic CHF - ECHO in Q000111Q showed an EF of 55-60% with Grade 1 DD.  - on Lasix, weight is 147 kg this AM, monitor daily weights, strict I/O - continue BB   OSA, morbid obesity due to excess calories  - on CPAP - Body mass index is 41.4 kg/(m^2).  Acute on Chronic Stage 4 CKD - followed by Dr. Lorrene Reid. - Cr trending down   Tobacco Abuse - cessation advised.  DVT prophylaxis: Heparin SQ Code Status: Full Family Communication: Patient at bedside  Disposition Plan: Once Myoview results are back, possibly today   Consultants:   Cardiology   Procedures:   Myoview   Antimicrobials:   None  Subjective: Reports feeling better   Objective: Filed Vitals:   07/19/15 1251 07/19/15 2119 07/20/15 0536 07/20/15 1222  BP: 130/68 141/70 111/56 158/91  Pulse: 86 83 88 86  Temp: 98.3 F (36.8 C) 98.4 F (36.9 C) 98.1 F (36.7 C) 97.9 F (36.6 C)  TempSrc: Oral Oral Oral Oral  Resp: 18 18 18 18   Height:      Weight:   147.328 kg (324  lb 12.8 oz)   SpO2: 99% 100% 99% 98%    Intake/Output Summary (Last 24 hours) at 07/20/15 1449 Last data filed at 07/20/15 0536  Gross per 24 hour  Intake    582 ml  Output    525 ml  Net     57 ml   Filed Weights   07/18/15 1535 07/19/15 0604 07/20/15 0536  Weight: 145.514 kg (320 lb 12.8 oz) 146.33 kg (322 lb 9.6 oz) 147.328 kg (324 lb 12.8 oz)    Examination:  General exam: Appears calm and comfortable  Respiratory system: Clear to auscultation. Respiratory effort normal. Cardiovascular system: S1 & S2 heard, RRR. No JVD, murmurs, rubs, gallops or clicks. No pedal edema. Gastrointestinal system: Abdomen is nondistended, soft and nontender.  Central nervous system: Alert and oriented. No focal neurological deficits. Extremities: Symmetric 5 x 5 power. 2+ R LEE Skin: No rashes, lesions or ulcers Psychiatry: Judgement and insight appear normal. Mood & affect appropriate.   Data Reviewed: I have personally reviewed following labs and imaging studies  CBC:  Recent Labs Lab 07/18/15 0839 07/19/15 0243 07/20/15 0539  WBC 5.6 5.5 5.0  HGB 10.7* 10.0* 9.6*  HCT 35.3* 32.3* 32.4*  MCV 85.7 86.4 86.4  PLT 205 207 Q000111Q   Basic Metabolic Panel:  Recent Labs Lab 07/18/15 0839 07/19/15 0243 07/20/15 0539  NA 142 141 141  K 3.4* 3.8 3.7  CL 104 107 105  CO2 25 22 24   GLUCOSE 152* 103* 114*  BUN 34* 34* 31*  CREATININE 4.07* 3.77* 3.55*  CALCIUM 9.7 9.1 9.2   Cardiac Enzymes:  Recent Labs Lab 07/18/15 1645 07/18/15 2157  TROPONINI <0.03 <0.03   HbA1C:  Recent Labs  07/18/15 1547  HGBA1C 7.2*   Lipid Profile:  Recent Labs  07/19/15 0243  CHOL 167  HDL 19*  LDLCALC 105*  TRIG 215*  CHOLHDL 8.8   Urine analysis:    Component Value Date/Time   COLORURINE YELLOW 02/26/2015 1939   APPEARANCEUR CLEAR 02/26/2015 1939   LABSPEC 1.016 02/26/2015 1939   PHURINE 5.0 02/26/2015 1939   GLUCOSEU NEGATIVE 02/26/2015 1939   HGBUR NEGATIVE 02/26/2015 1939    HGBUR negative 05/16/2008 0926   BILIRUBINUR NEGATIVE 02/26/2015 1939   KETONESUR NEGATIVE 02/26/2015 1939   PROTEINUR 30* 02/26/2015 1939   UROBILINOGEN 0.2 11/17/2013 0748   NITRITE NEGATIVE 02/26/2015 1939   LEUKOCYTESUR NEGATIVE 02/26/2015 1939    Radiology Studies: Dg Chest 2 View 07/18/2015   No active cardiopulmonary disease.   Scheduled Meds: . allopurinol  300 mg Oral Daily  . aspirin  325 mg Oral Daily  . atorvastatin  40 mg Oral q1800  . carvedilol  25 mg Oral BID  . fenofibrate  54 mg Oral Daily  . furosemide  80 mg Oral BID  . guaiFENesin  1,200 mg Oral BID  . heparin  5,000 Units Subcutaneous Q8H  . hydrALAZINE  37.5 mg Oral TID  . isosorbide mononitrate  30 mg Oral Daily  . potassium chloride SA  20 mEq Oral BID   Continuous Infusions:  Time spent: 20 minutes   Faye Ramsay, MD [facility] Pager [phone]  If 7PM-7AM, please contact night-coverage [email or web address] Password [facility] 07/20/2015, 2:49 PM

## 2015-07-20 NOTE — Progress Notes (Signed)
IMPRESSION: 1. Fixed defect in the inferior wall.  2. Inferior hypokinesis.  3. Left ventricular ejection fraction 47%  4. Intermediate-risk stress test findings*.    Hilbert Corrigan PA Pager: [phone]

## 2015-07-20 NOTE — Progress Notes (Signed)
Subjective: Seen in nuclear medicine. No events overnight. Patient refused CPAP. Plan second portion of stress test today.  Objective: Vital signs in last 24 hours: Temp:  [98.1 F (36.7 C)-98.4 F (36.9 C)] 98.1 F (36.7 C) (04/29 0536) Pulse Rate:  [83-88] 88 (04/29 0536) Resp:  [18] 18 (04/29 0536) BP: (111-141)/(56-70) 111/56 mmHg (04/29 0536) SpO2:  [99 %-100 %] 99 % (04/29 0536) Weight:  [324 lb 12.8 oz (147.328 kg)] 324 lb 12.8 oz (147.328 kg) (04/29 0536) Last BM Date: 07/18/15  Intake/Output from previous day: 04/28 0701 - 04/29 0700 In: 702 [P.O.:702] Out: 525 [Urine:525] Intake/Output this shift:    Medications Scheduled Meds: . allopurinol  300 mg Oral Daily  . aspirin  325 mg Oral Daily  . carvedilol  25 mg Oral BID  . fenofibrate  54 mg Oral Daily  . furosemide  80 mg Oral BID  . guaiFENesin  1,200 mg Oral BID  . heparin  5,000 Units Subcutaneous Q8H  . hydrALAZINE  37.5 mg Oral TID  . isosorbide mononitrate  30 mg Oral Daily  . potassium chloride SA  20 mEq Oral BID   Continuous Infusions:  PRN Meds:.acetaminophen, albuterol, gi cocktail, HYDROcodone-acetaminophen, morphine injection, ondansetron (ZOFRAN) IV  PE: General appearance: alert, cooperative and no distress Lungs: clear to auscultation bilaterally Heart: regular rate and rhythm, S1, S2 normal, no murmur, click, rub or gallop Extremities: 2+ right LEE Pulses: 2+ and symmetric Skin: Warm and dry Neurologic: Grossly normal  Lab Results:   Recent Labs  07/18/15 0839 07/19/15 0243 07/20/15 0539  WBC 5.6 5.5 5.0  HGB 10.7* 10.0* 9.6*  HCT 35.3* 32.3* 32.4*  PLT 205 207 223   BMET  Recent Labs  07/18/15 0839 07/19/15 0243 07/20/15 0539  NA 142 141 141  K 3.4* 3.8 3.7  CL 104 107 105  CO2 25 22 24   GLUCOSE 152* 103* 114*  BUN 34* 34* 31*  CREATININE 4.07* 3.77* 3.55*  CALCIUM 9.7 9.1 9.2   PT/INR No results for input(s): LABPROT, INR in the last 72  hours. Cholesterol  Recent Labs  07/19/15 0243  CHOL 167   Lipid Panel     Component Value Date/Time   CHOL 167 07/19/2015 0243   TRIG 215* 07/19/2015 0243   TRIG 98 03/03/2006 0910   HDL 19* 07/19/2015 0243   CHOLHDL 8.8 07/19/2015 0243   CHOLHDL 5.5 CALC 03/03/2006 0910   VLDL 43* 07/19/2015 0243   LDLCALC 105* 07/19/2015 0243   Lipid Panel     Component Value Date/Time   CHOL 167 07/19/2015 0243   TRIG 215* 07/19/2015 0243   TRIG 98 03/03/2006 0910   HDL 19* 07/19/2015 0243   CHOLHDL 8.8 07/19/2015 0243   CHOLHDL 5.5 CALC 03/03/2006 0910   VLDL 43* 07/19/2015 0243   LDLCALC 105* 07/19/2015 0243       Assessment/Plan    Active Problems:   Essential hypertension   Hypokalemia   HTN (hypertension)   CKD (chronic kidney disease) stage 4, GFR 15-29 ml/min (HCC)   Chest pain   Anemia in chronic kidney disease  . Chest Pain with low-risk of ACS Ruled out for MI.  Second day of myoview today - await final read.  2. HTN  Stable continue Coreg 25mg  BID, Hydralazine 37.5mg  TID, and Imdur 30mg  daily.   3. HLD LDL 105 - start atorvastatin 40 mg daily.  4. Chronic Diastolic CHF - echo in Q000111Q showed an EF of 55-60% with  Grade 1 DD. Has lower extremity edema on exam on the right LEE.  Wears compression sock at home.  CXR negative and BNP normal at 22. Does not weigh daily. - PO Lasix held in setting of AKI. SCr improved to 3.55 - continue BB   5. OSA   - on CPAP at home, but has refused it here.  Pixie Casino, MD, [facility] Attending Cardiologist [place] [facility]  07/20/2015 12:09 PM

## 2015-07-20 NOTE — Discharge Summary (Signed)
Physician Discharge Summary  Craig Flynn [id number] DOB: [birth year] DOA: 07/18/2015  PCP: Mauricio Po, FNP  Admit date: 07/18/2015 Discharge date: 07/20/2015  Recommendations for Outpatient Follow-up:  1. Pt will need to follow up with PCP in 2-3 weeks post discharge 2. Please obtain BMP to evaluate electrolytes and kidney function  Discharge Diagnoses:  Active Problems:   Essential hypertension   Hypokalemia   HTN (hypertension)   CKD (chronic kidney disease) stage 4, GFR 15-29 ml/min (HCC)   Chest pain   Anemia in chronic kidney disease  Discharge Condition: Stable  Diet recommendation: Heart healthy diet discussed in details   Brief Narrative:  55 y.o. male with HTN, Cardiomyopathy and obesity, presented to [facility] ED for evaluation of chest pain, radiating the left shoulder and arm. He called his cardiologist who referred him to the ED.  Assessment & Plan: Chest Pain with low-risk of ACS - initial troponin negative and EKG without acute ischemic changes - CE's negative so far - cardiology team following, we appreciate assistance, Myoview results reviewed per cardiology, no acute findings that necessitate further cardiology work up  - cardiology team has cleared pt for discharge   HTN, Essential  - continue Coreg 25mg  BID, Hydralazine 37.5mg  TID, and Imdur 30mg  daily.   HLD  Chronic Diastolic CHF - ECHO in Q000111Q showed an EF of 55-60% with Grade 1 DD.  - on Lasix, weight is 147 kg this AM - continue BB   OSA, morbid obesity due to excess calories  - on CPAP - Body mass index is 41.4 kg/(m^2).  Acute on Chronic Stage 4 CKD - followed by Dr. Lorrene Reid. - Cr trending down   Tobacco Abuse - cessation advised.  Code Status: Full Family Communication: Patient at bedside  Disposition Plan: Home  Consultants:   Cardiology  Procedures:   Myoview  Antimicrobials:   None  Discharge Exam: Filed Vitals:   07/20/15 0536 07/20/15 1222  BP: 111/56  158/91  Pulse: 88 86  Temp: 98.1 F (36.7 C) 97.9 F (36.6 C)  Resp: 18 18   Filed Vitals:   07/19/15 1251 07/19/15 2119 07/20/15 0536 07/20/15 1222  BP: 130/68 141/70 111/56 158/91  Pulse: 86 83 88 86  Temp: 98.3 F (36.8 C) 98.4 F (36.9 C) 98.1 F (36.7 C) 97.9 F (36.6 C)  TempSrc: Oral Oral Oral Oral  Resp: 18 18 18 18   Height:      Weight:   147.328 kg (324 lb 12.8 oz)   SpO2: 99% 100% 99% 98%    General: Pt is alert, follows commands appropriately, not in acute distress Cardiovascular: Regular rate and rhythm, no rubs, no gallops Respiratory: Clear to auscultation bilaterally, no wheezing, no crackles, no rhonchi Abdominal: Soft, non tender, non distended, bowel sounds +, no guarding   Discharge Instructions  Discharge Instructions    Diet - low sodium heart healthy    Complete by:  As directed      Increase activity slowly    Complete by:  As directed             Medication List    TAKE these medications        albuterol 108 (90 Base) MCG/ACT inhaler  Commonly known as:  PROVENTIL HFA;VENTOLIN HFA  Inhale 2 puffs into the lungs every 6 (six) hours as needed for wheezing or shortness of breath.     allopurinol 300 MG tablet  Commonly known as:  ZYLOPRIM  Take 1 tablet (300 mg total)  by mouth daily.     aspirin 325 MG tablet  Take 1 tablet (325 mg total) by mouth daily.     atorvastatin 40 MG tablet  Commonly known as:  LIPITOR  Take 1 tablet (40 mg total) by mouth daily at 6 PM.     carvedilol 25 MG tablet  Commonly known as:  COREG  Take 1 tablet (25 mg total) by mouth 2 (two) times daily.     fenofibrate 54 MG tablet  Take 1 tablet (54 mg total) by mouth daily.     furosemide 80 MG tablet  Commonly known as:  LASIX  Take 1 tablet (80 mg total) by mouth 2 (two) times daily.     guaiFENesin 600 MG 12 hr tablet  Commonly known as:  MUCINEX  Take 2 tablets (1,200 mg total) by mouth 2 (two) times daily.     hydrALAZINE 25 MG tablet   Commonly known as:  APRESOLINE  Take 1.5 tablets (37.5 mg total) by mouth 3 (three) times daily.     HYDROcodone-acetaminophen 5-325 MG tablet  Commonly known as:  NORCO/VICODIN  Take 1 tablet by mouth every 4 (four) hours as needed for moderate pain.     isosorbide mononitrate 30 MG 24 hr tablet  Commonly known as:  IMDUR  Take 1 tablet (30 mg total) by mouth daily.     potassium chloride SA 20 MEQ tablet  Commonly known as:  K-DUR,KLOR-CON  Take 20 mEq by mouth 2 (two) times daily.     predniSONE 10 MG (48) Tbpk tablet  Commonly known as:  STERAPRED UNI-PAK 48 TAB  Take 6 tablets x 4 days, 4 tablets x 4 days, 2 tablets x 2 days     tiZANidine 4 MG tablet  Commonly known as:  ZANAFLEX  Take 1 tablet (4 mg total) by mouth every 8 (eight) hours as needed.     Vitamin D (Ergocalciferol) 50000 units Caps capsule  Commonly known as:  DRISDOL  Take 2 capsules by mouth once a week.            Follow-up Information    Follow up with Minus Breeding, MD.   Specialty:  Cardiology   Why:  office scheduler will contact you to arrange followup, please give [place] a call if you do not hear from [place] in 3 business days   Contact information:   [address] [place] [address] [phone]       Follow up with Mauricio Po, [place].   Specialty:  Family Medicine   Contact information:   [place] [address] [phone]       Call Faye Ramsay, MD.   Specialty:  Internal Medicine   Why:  As needed   Contact information:   [address] [place] [address] [phone]        The results of significant diagnostics from this hospitalization (including imaging, microbiology, ancillary and laboratory) are listed below for reference.     Microbiology: No results found for this or any previous visit (from the past 240 hour(s)).   Labs: Basic Metabolic Panel:  Recent Labs Lab 07/18/15 0839 07/19/15 0243  07/20/15 0539  NA 142 141 141  K 3.4* 3.8 3.7  CL 104 107 105  CO2 25 22 24   GLUCOSE 152* 103* 114*  BUN 34* 34* 31*  CREATININE 4.07* 3.77* 3.55*  CALCIUM 9.7 9.1 9.2   CBC:  Recent Labs Lab 07/18/15  [id number] 07/19/15 0243 07/20/15 0539  WBC 5.6 5.5 5.0  HGB 10.7* 10.0* 9.6*  HCT 35.3* 32.3* 32.4*  MCV 85.7 86.4 86.4  PLT 205 207 223   Cardiac Enzymes:  Recent Labs Lab 07/18/15 1645 07/18/15 2157  TROPONINI <0.03 <0.03   BNP: BNP (last 3 results)  Recent Labs  10/31/14 0003 02/25/15 2103 07/18/15 0839  BNP 44.4 22.0 22.7   SIGNED: Time coordinating discharge: 30 minutes  MAGICK-Larra Crunkleton, MD  [facility] 07/20/2015, 5:06 PM Pager [phone]  If 7PM-7AM, please contact night-coverage [email or web address] Password [email or web address]

## 2015-07-20 NOTE — Discharge Instructions (Signed)

## 2015-07-22 ENCOUNTER — Telehealth: Payer: Self-pay

## 2015-07-22 NOTE — Telephone Encounter (Signed)
Patient called and said that he has some questions about the referral he got. He wanted to ask a Dr. Jenny Reichmann somes questions about it. Please follow up.

## 2015-07-23 NOTE — Telephone Encounter (Signed)
Pt states that he has a balance of 200 dollars from a past visit and can not be seen until it is paid. He is asking if you can send him in some prednisone for his arthritis until he can get that paid off. He said you have sent it in the past.

## 2015-07-24 MED ORDER — PREDNISONE 10 MG (48) PO TBPK: ORAL_TABLET | ORAL | Status: DC

## 2015-07-24 NOTE — Addendum Note (Signed)
Addended by: Mauricio Po D on: 07/24/2015 04:37 PM   Modules accepted: Orders

## 2015-07-24 NOTE — Telephone Encounter (Signed)
Medication sent to pharmacy  

## 2015-07-24 NOTE — Telephone Encounter (Signed)
Talked with pt and he is still waiting to hear back regarding the encounters below

## 2015-07-24 NOTE — Telephone Encounter (Signed)
Pt aware.

## 2015-07-29 ENCOUNTER — Other Ambulatory Visit (INDEPENDENT_AMBULATORY_CARE_PROVIDER_SITE_OTHER): Payer: BLUE CROSS/BLUE SHIELD

## 2015-07-29 ENCOUNTER — Encounter: Payer: Self-pay | Admitting: Family

## 2015-07-29 ENCOUNTER — Ambulatory Visit (INDEPENDENT_AMBULATORY_CARE_PROVIDER_SITE_OTHER): Payer: BLUE CROSS/BLUE SHIELD | Admitting: Family

## 2015-07-29 VITALS — BP 150/80 | HR 73 | Temp 97.5°F | Resp 16 | Ht 74.0 in | Wt 325.0 lb

## 2015-07-29 DIAGNOSIS — R0789 Other chest pain: Secondary | ICD-10-CM | POA: Diagnosis not present

## 2015-07-29 DIAGNOSIS — G4733 Obstructive sleep apnea (adult) (pediatric): Secondary | ICD-10-CM | POA: Diagnosis not present

## 2015-07-29 DIAGNOSIS — K219 Gastro-esophageal reflux disease without esophagitis: Secondary | ICD-10-CM | POA: Diagnosis not present

## 2015-07-29 LAB — BASIC METABOLIC PANEL
BUN: 59 mg/dL — ABNORMAL HIGH (ref 6–23)
CO2: 30 mEq/L (ref 19–32)
Calcium: 9.4 mg/dL (ref 8.4–10.5)
Chloride: 98 mEq/L (ref 96–112)
Creatinine, Ser: 3.53 mg/dL — ABNORMAL HIGH (ref 0.40–1.50)
GFR: 23.31 mL/min — ABNORMAL LOW (ref >60.00)
Glucose, Bld: 203 mg/dL — ABNORMAL HIGH (ref 70–99)
Potassium: 3.7 mEq/L (ref 3.5–5.1)
Sodium: 140 mEq/L (ref 135–145)

## 2015-07-29 MED ORDER — PANTOPRAZOLE SODIUM 40 MG PO TBEC: 40.0000 mg | DELAYED_RELEASE_TABLET | Freq: Every day | ORAL | Status: DC

## 2015-07-29 NOTE — Progress Notes (Signed)
Subjective:    Patient ID: [name], male    DOB: 1960-12-20, 55 y.o.   MRN: [id number]  Chief Complaint  Patient presents with  . Hospitalization Follow-up    wants to talk about his "bowels and his throat" feels like he has a lump in his throat and states that there is a long space between BMs    HPI:  JONHATAN GILKISON is a 55 y.o. male who  has a past medical history of Hypertension; Gout; Other specified disorder of intestines; Cardiomyopathy ([place]); CKD (chronic kidney disease), stage IV ([place]); OSA (obstructive sleep apnea); Anemia (06/2015); Chest pain (06/2015); and Arthritis. and presents today for a hospital follow up.   Recently evaluated in the emergency room with 2 episodes of chest pain with a 12 hour time period. Describes a centralized chest pain radiating to the left while lying down and was accompanied by dizziness, shortness of breath, and nausea. Symptoms lasted approximately 15-20 minutes prior to resolving with no treatment. Upon evaluation in the emergency department his pain was rated 8/10 and described as sharp and mostly in the left chest. He was also noted to complain of unilateral right leg swelling increasing over the last 2 weeks. He was placed at a moderate risk for cardiac event. Echocardiogram in July 2016 showed no acute abnormalities. Troponins and d-dimer were both negative. Pain was 3/10 upon reevaluation and declined pain management. Diagnosed with chest pain with low risk for acute coronary syndrome with initial troponins negative and EKG without acute ischemic changes. He was advised to follow-up with primary care and hospitals recommends a basic metabolic panel to check flexion lites and kidney function. All hospital notes, imaging, and labs were reviewed in detail   Since leaving the hospital he reports feeling better with occasional shortness of breath without any further incidence of chest pain. Endorses he continues to express a feeling of fullness in his  throat and constipation. Constipation is relieved with peanut butter crackers. Does have some crampy and occasionally sharp abdominal pain with no pain currently.  Will need a new referral for sleep. The swelling of his leg continues to occur and he is awaiting referral to orthopedics.   No Known Allergies   Current Outpatient Prescriptions on File Prior to Visit  Medication Sig Dispense Refill  . albuterol (PROVENTIL HFA;VENTOLIN HFA) 108 (90 BASE) MCG/ACT inhaler Inhale 2 puffs into the lungs every 6 (six) hours as needed for wheezing or shortness of breath. 1 Inhaler 2  . allopurinol (ZYLOPRIM) 300 MG tablet Take 1 tablet (300 mg total) by mouth daily. 30 tablet 3  . aspirin 325 MG tablet Take 1 tablet (325 mg total) by mouth daily. 30 tablet 3  . atorvastatin (LIPITOR) 40 MG tablet Take 1 tablet (40 mg total) by mouth daily at 6 PM. 30 tablet 0  . carvedilol (COREG) 25 MG tablet Take 1 tablet (25 mg total) by mouth 2 (two) times daily. 60 tablet 6  . fenofibrate 54 MG tablet Take 1 tablet (54 mg total) by mouth daily. 30 tablet 11  . furosemide (LASIX) 80 MG tablet Take 1 tablet (80 mg total) by mouth 2 (two) times daily. 60 tablet 1  . guaiFENesin (MUCINEX) 600 MG 12 hr tablet Take 2 tablets (1,200 mg total) by mouth 2 (two) times daily. 20 tablet 0  . hydrALAZINE (APRESOLINE) 25 MG tablet Take 1.5 tablets (37.5 mg total) by mouth 3 (three) times daily. 135 tablet 11  . isosorbide mononitrate (  IMDUR) 30 MG 24 hr tablet Take 1 tablet (30 mg total) by mouth daily. 30 tablet 11  . potassium chloride SA (K-DUR,KLOR-CON) 20 MEQ tablet Take 20 mEq by mouth 2 (two) times daily.    . predniSONE (STERAPRED UNI-PAK 48 TAB) 10 MG (48) TBPK tablet Take 6 tablets x 4 days, 4 tablets x 4 days, 2 tablets x 2 days 48 tablet 0  . tiZANidine (ZANAFLEX) 4 MG tablet Take 1 tablet (4 mg total) by mouth every 8 (eight) hours as needed. 30 tablet 0  . Vitamin D, Ergocalciferol, (DRISDOL) 50000 units CAPS capsule  Take 2 capsules by mouth once a week.  0   No current facility-administered medications on file prior to visit.     Past Surgical History  Procedure Laterality Date  . Cholecystectomy    . Umbilical hernia repair      Past Medical History  Diagnosis Date  . Hypertension   . Gout   . 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 Cardiomyopathy ([place])     a. 10/2013: EF reduced to 35-40% b. 09/2014: EF improved to 55-60%, Grade 1 DD noted.  . CKD (chronic kidney disease), stage IV ([place])   . OSA (obstructive sleep apnea)     a. on CPAP  . Anemia 06/2015  . Chest pain 06/2015  . Arthritis      Review of Systems  Constitutional: Negative for fever and chills.  Respiratory: Positive for shortness of breath. Negative for chest tightness and wheezing.   Cardiovascular: Negative for chest pain, palpitations and leg swelling.  Gastrointestinal: Positive for constipation. Negative for nausea, vomiting, diarrhea and blood in stool.  Neurological: Negative for headaches.      Objective:    BP 150/80 mmHg  Pulse 73  Temp(Src) 97.5 F (36.4 C) (Oral)  Resp 16  Ht 6\' 2"  (1.88 m)  Wt 325 lb (147.419 kg)  BMI 41.71 kg/m2  SpO2 98% Nursing note and vital signs reviewed.  Physical Exam  Constitutional: He is oriented to person, place, and time. He appears well-developed and well-nourished. No distress.  Cardiovascular: Normal rate, regular rhythm, normal heart sounds and intact distal pulses.   Pulmonary/Chest: Effort normal and breath sounds normal. He has no wheezes. He exhibits no tenderness.  Abdominal: Soft. Bowel sounds are normal. He exhibits no distension and no mass. There is no tenderness. There is no rebound and no guarding.  Neurological: He is alert and oriented to person, place, and time.  Skin: Skin is warm and dry.  Psychiatric: He has a normal mood and affect. His behavior is normal. Judgment and thought  content normal.       Assessment & Plan:   Problem List Items Addressed This Visit      Respiratory   OSA (obstructive sleep apnea)    States he has been compliant with CPAP. Indicates he needs a new referral to sleep medicine for a sleep study. Referral placed.       Relevant Orders   Ambulatory referral to Pulmonology     Digestive   GERD (gastroesophageal reflux disease) - Primary    Symptoms consistent with GERD. Start pantoprazole. GERD diet encouraged. Encouraged weight loss. Follow up pending trial of medication.       Relevant Medications   pantoprazole (PROTONIX) 40 MG tablet     Other   Atypical chest pain    No further symptoms of  chest pain with fullness feeling in his throat most likely related to GERD. Obtain BMET. Does not appear to be cardiac in origin. Start pantoprazole. Follow up with cardiology as scheduled. GERD diet encouraged. Follow up pending trial of medications.      Relevant Orders   Basic Metabolic Panel (BMET) (Completed)       I have discontinued Mr. Linenberger HYDROcodone-acetaminophen. I am also having him start on pantoprazole. Additionally, I am having him maintain his aspirin, carvedilol, tiZANidine, albuterol, furosemide, guaiFENesin, Vitamin D (Ergocalciferol), isosorbide mononitrate, fenofibrate, hydrALAZINE, allopurinol, potassium chloride SA, atorvastatin, and predniSONE.   Meds ordered this encounter  Medications  . pantoprazole (PROTONIX) 40 MG tablet    Sig: Take 1 tablet (40 mg total) by mouth daily.    Dispense:  30 tablet    Refill:  3    Order Specific Question:  Supervising Provider    Answer:  Pricilla Holm A [id number]     Follow-up: Return in about 1 month (around 08/29/2015).  Mauricio Po, FNP

## 2015-07-29 NOTE — Progress Notes (Signed)
Pre visit review using our clinic review tool, if applicable. No additional management support is needed unless otherwise documented below in the visit note. 

## 2015-07-29 NOTE — Assessment & Plan Note (Signed)
States he has been compliant with CPAP. Indicates he needs a new referral to sleep medicine for a sleep study. Referral placed.

## 2015-07-29 NOTE — Assessment & Plan Note (Signed)
No further symptoms of chest pain with fullness feeling in his throat most likely related to GERD. Obtain BMET. Does not appear to be cardiac in origin. Start pantoprazole. Follow up with cardiology as scheduled. GERD diet encouraged. Follow up pending trial of medications.

## 2015-07-29 NOTE — Patient Instructions (Addendum)
Thank you for choosing [facility].  Summary/Instructions:  Please continue to take your medication as prescribed.  Follow up with cardiology as scheduled.   Please let me know if the medication helps with your feelings of your throat.  Your prescription(s) have been submitted to your pharmacy or been printed and provided for you. Please take as directed and contact our office if you believe you are having problem(s) with the medication(s) or have any questions.  Please stop by the lab on the basement level of the building for your blood work. Your results will be released to [place] (or called to you) after review, usually within 72 hours after test completion. If any changes need to be made, you will be notified at that same time.  If your symptoms worsen or fail to improve, please contact our office for further instruction, or in case of emergency go directly to the emergency room at the closest medical facility.

## 2015-07-29 NOTE — Assessment & Plan Note (Signed)
Symptoms consistent with GERD. Start pantoprazole. GERD diet encouraged. Encouraged weight loss. Follow up pending trial of medication.

## 2015-08-01 ENCOUNTER — Ambulatory Visit (INDEPENDENT_AMBULATORY_CARE_PROVIDER_SITE_OTHER): Payer: BLUE CROSS/BLUE SHIELD | Admitting: Physician Assistant

## 2015-08-01 ENCOUNTER — Encounter: Payer: Self-pay | Admitting: Physician Assistant

## 2015-08-01 VITALS — BP 130/72 | HR 76 | Ht 74.0 in | Wt 323.8 lb

## 2015-08-01 DIAGNOSIS — E785 Hyperlipidemia, unspecified: Secondary | ICD-10-CM

## 2015-08-01 DIAGNOSIS — R079 Chest pain, unspecified: Secondary | ICD-10-CM | POA: Diagnosis not present

## 2015-08-01 DIAGNOSIS — I10 Essential (primary) hypertension: Secondary | ICD-10-CM | POA: Diagnosis not present

## 2015-08-01 DIAGNOSIS — G4733 Obstructive sleep apnea (adult) (pediatric): Secondary | ICD-10-CM

## 2015-08-01 DIAGNOSIS — I5022 Chronic systolic (congestive) heart failure: Secondary | ICD-10-CM | POA: Insufficient documentation

## 2015-08-01 DIAGNOSIS — Z720 Tobacco use: Secondary | ICD-10-CM

## 2015-08-01 NOTE — Progress Notes (Signed)
Patient ID: [name], male   DOB: [birth year], 55 y.o.   MRN: [id number]    Date:  08/01/2015   ID:  [name], DOB 01/17/1961, MRN [id number]  PCP:  Mauricio Po, FNP  Primary Cardiologist:  [facility]  Chief Complaint  Patient presents with  . Follow-up    post hospital      History of Present Illness: Craig Flynn is a 55 y.o. obese male with past medical history of HTN, Stage 4 CKD, Gout, Chronic Diastolic CHF, OSA (on CPAP), OA, and tobacco abuse who presented to [facility] ED on 07/18/2015 for evaluation of chest pain.  He was seen by Dr. Percival Spanish on 06/24/2015 for establishment of care in relation to known cardiomyopathy and HTN. Had an echo in 10/2013 which showed an EF of 35-40%. Never followed up with Cardiology as an outpatient. He had a repeat echo in 09/2014 which showed an improved EF of 55-60%.  Patient ruled out for MI and had a stress test which revealed a fixed defect in the inferior wall. Inferior hypokinesis. Ejection fraction 47%.  Patient presents for posthospital follow-up.  Patient reports doing well with no particular complaints. [facility] [employer] to start [employer].  His plan is to lose 4 pounds per month before his next office visit.  He has R>L LEE which is typical for him.  He sleeps with a CPAP every night.  The patient currently denies nausea, vomiting, fever, chest pain, shortness of breath beyond baseline, orthopnea, dizziness, PND, cough, congestion, abdominal pain, hematochezia, melena, claudication.  Wt Readings from Last 3 Encounters:  08/01/15 323 lb 12.8 oz (146.875 kg)  07/29/15 325 lb (147.419 kg)  07/20/15 324 lb 12.8 oz (147.328 kg)     Past Medical History  Diagnosis Date  . Hypertension   . Gout   . 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 Cardiomyopathy ([place])     a. 10/2013: EF reduced to 35-40% b. 09/2014: EF improved to 55-60%, Grade 1 DD noted.  . CKD (chronic  kidney disease), stage IV ([place])   . OSA (obstructive sleep apnea)     a. on CPAP  . Anemia 06/2015  . Chest pain 06/2015  . Arthritis     Current Outpatient Prescriptions  Medication Sig Dispense Refill  . allopurinol (ZYLOPRIM) 300 MG tablet Take 1 tablet (300 mg total) by mouth daily. 30 tablet 3  . aspirin 325 MG tablet Take 1 tablet (325 mg total) by mouth daily. 30 tablet 3  . atorvastatin (LIPITOR) 40 MG tablet Take 1 tablet (40 mg total) by mouth daily at 6 PM. 30 tablet 0  . carvedilol (COREG) 25 MG tablet Take 1 tablet (25 mg total) by mouth 2 (two) times daily. 60 tablet 6  . fenofibrate 54 MG tablet Take 1 tablet (54 mg total) by mouth daily. 30 tablet 11  . furosemide (LASIX) 80 MG tablet Take 1 tablet (80 mg total) by mouth 2 (two) times daily. 60 tablet 1  . hydrALAZINE (APRESOLINE) 25 MG tablet Take 1.5 tablets (37.5 mg total) by mouth 3 (three) times daily. 135 tablet 11  . isosorbide mononitrate (IMDUR) 30 MG 24 hr tablet Take 1 tablet (30 mg total) by mouth daily. 30 tablet 11  . pantoprazole (PROTONIX) 40 MG tablet Take 1 tablet (40 mg total) by mouth daily. 30 tablet 3  . potassium chloride SA (K-DUR,KLOR-CON)  20 MEQ tablet Take 20 mEq by mouth 2 (two) times daily.    . Vitamin D, Ergocalciferol, (DRISDOL) 50000 units CAPS capsule Take 2 capsules by mouth once a week.  0   No current facility-administered medications for this visit.    Allergies:   No Known Allergies  Social History:  The patient  reports that he has been smoking Cigarettes.  He has a 15 pack-year smoking history. He has never used smokeless tobacco. He reports that he does not drink alcohol or use illicit drugs.   Family history:   Family History  Problem Relation Age of Onset  . Stroke Mother   . Pneumonia Father     died of Pneumonia x3  . CAD Neg Hx   . Kidney disease Maternal Grandmother   . Hypertension Maternal Grandmother   . Healthy Maternal Grandfather   . Healthy Paternal  Grandmother   . Healthy Paternal Grandfather     ROS:  Please see the history of present illness.  All other systems reviewed and negative.   PHYSICAL EXAM: VS:  BP 130/72 mmHg  Pulse 76  Ht 6\' 2"  (1.88 m)  Wt 323 lb 12.8 oz (146.875 kg)  BMI 41.56 kg/m2 Morbidly obese, well developed, in no acute distress HEENT: Pupils are equal round react to light accommodation extraocular movements are intact.  Neck: no JVDNo cervical lymphadenopathy. Cardiac: Regular rate and rhythm without murmurs rubs or gallops. Lungs:  clear to auscultation bilaterally, no wheezing, rhonchi or rales Abd: soft, nontender, positive bowel sounds all quadrants, no hepatosplenomegaly Ext: no lower extremity edema.  2+ radial and dorsalis pedis pulses. Skin: warm and dry Neuro:  Grossly normal  EKG:  Normal sinus rhythm rate 76 bpm nonspecific lateral T-wave changes   ASSESSMENT AND PLAN:  Problem List Items Addressed This Visit    Tobacco abuse   Pain in the chest - Primary   Relevant Orders   EKG 12-Lead   OSA (obstructive sleep apnea)   Morbid obesity (HCC)   HTN (hypertension)   Dyslipidemia   Chronic systolic heart failure [facility])     [name] appears to be doing well since his recent hospitalization. He ruled out for MI and had a nonischemic stress test. We talked about tobacco cessation during this visit and weight loss. He has joined the [employer] is hoping to lose 4 pounds a month. His blood pressure is controlled continues to take Lipitor and fenofibrate for his cholesterol. Heart failure meds include Coreg 25 mg twice a day, Lasix 80 mg twice a day, Imdur 30 mg daily hydralazine 37.5 mg 3 times a day.  He will follow-up in 6 months.

## 2015-08-01 NOTE — Patient Instructions (Signed)
Medication Instructions:   Your physician recommends that you continue on your current medications as directed. Please refer to the Current Medication list given to you today.   If you need a refill on your cardiac medications before your next appointment, please call your pharmacy.  Labwork: NONE ORDER TODAY    Testing/Procedures: NONE ORDER TODAY    Follow-Up: Your physician wants you to follow-up in:  IN [place] will receive a reminder letter in the mail two months in advance. If you don't receive a letter, please call our office to schedule the follow-up appointment.      Any Other Special Instructions Will Be Listed Below (If Applicable).

## 2015-08-15 ENCOUNTER — Other Ambulatory Visit: Payer: Self-pay | Admitting: *Deleted

## 2015-08-21 ENCOUNTER — Telehealth: Payer: Self-pay | Admitting: *Deleted

## 2015-08-21 NOTE — Telephone Encounter (Signed)
Left msg on triage stating he is needing refill on his prednisone. Is this ok...Craig Flynn

## 2015-08-21 NOTE — Telephone Encounter (Signed)
It is too soon to do another course of prednisone.

## 2015-08-21 NOTE — Telephone Encounter (Signed)
Patient has called back in regard?  °

## 2015-08-23 NOTE — Telephone Encounter (Signed)
Patient states he is going to go to the Emergency Room to get prednisone.

## 2015-08-23 NOTE — Telephone Encounter (Signed)
Noted  

## 2015-08-27 ENCOUNTER — Ambulatory Visit (INDEPENDENT_AMBULATORY_CARE_PROVIDER_SITE_OTHER): Payer: BLUE CROSS/BLUE SHIELD | Admitting: Family

## 2015-08-27 ENCOUNTER — Other Ambulatory Visit (INDEPENDENT_AMBULATORY_CARE_PROVIDER_SITE_OTHER): Payer: BLUE CROSS/BLUE SHIELD

## 2015-08-27 ENCOUNTER — Encounter: Payer: Self-pay | Admitting: Family

## 2015-08-27 VITALS — BP 138/62 | HR 87 | Temp 98.2°F | Resp 16 | Ht 74.0 in | Wt 322.0 lb

## 2015-08-27 DIAGNOSIS — R198 Other specified symptoms and signs involving the digestive system and abdomen: Secondary | ICD-10-CM | POA: Diagnosis not present

## 2015-08-27 DIAGNOSIS — R42 Dizziness and giddiness: Secondary | ICD-10-CM | POA: Diagnosis not present

## 2015-08-27 LAB — CBC
HCT: 34.9 % — ABNORMAL LOW (ref 39.0–52.0)
Hemoglobin: 11.3 g/dL — ABNORMAL LOW (ref 13.0–17.0)
MCHC: 32.3 g/dL (ref 30.0–36.0)
MCV: 81.8 fl (ref 78.0–100.0)
Platelets: 244 10*3/uL (ref 150.0–400.0)
RBC: 4.27 Mil/uL (ref 4.22–5.81)
RDW: 17.2 % — ABNORMAL HIGH (ref 11.5–15.5)
WBC: 7.8 10*3/uL (ref 4.0–10.5)

## 2015-08-27 LAB — BASIC METABOLIC PANEL
BUN: 46 mg/dL — ABNORMAL HIGH (ref 6–23)
CO2: 24 mEq/L (ref 19–32)
Calcium: 9.6 mg/dL (ref 8.4–10.5)
Chloride: 107 mEq/L (ref 96–112)
Creatinine, Ser: 4.19 mg/dL — ABNORMAL HIGH (ref 0.40–1.50)
GFR: 19.12 mL/min — ABNORMAL LOW (ref >60.00)
Glucose, Bld: 94 mg/dL (ref 70–99)
Potassium: 3.6 mEq/L (ref 3.5–5.1)
Sodium: 141 mEq/L (ref 135–145)

## 2015-08-27 NOTE — Assessment & Plan Note (Signed)
Dizziness/lightheadedness of undetermined origin with no evidence of infections. Cannot rule out dehydration. Obtain before meals metabolic profile and CBC. Encouraged to change position slowly. Cannot rule out underlying heart failure or medication related although there have been no new medication changes recently. Follow-up pending blood work.

## 2015-08-27 NOTE — Patient Instructions (Addendum)
Thank you for choosing [facility].  Summary/Instructions:  Continue to take your medications as prescribed.  They will call to schedule your appointment with gastroenterology.  Please stop by the lab on the basement level of the building for your blood work. Your results will be released to [place] (or called to you) after review, usually within 72 hours after test completion. If any changes need to be made, you will be notified at that same time.  If your symptoms worsen or fail to improve, please contact our office for further instruction, or in case of emergency go directly to the emergency room at the closest medical facility.

## 2015-08-27 NOTE — Assessment & Plan Note (Signed)
Continues to experience fullness and thickness/throat unrelieved by pantoprazole. Denies dysphasia and is able to eat well. Reports his constipation is improved. Recommend probiotic. Refer to gastroenterology for possible endoscopy as cannot rule out a possible esophageal narrowing.

## 2015-08-27 NOTE — Progress Notes (Signed)
Subjective:    Patient ID: Craig Flynn, male    DOB: 1961-03-04, 55 y.o.   MRN: [id number]  Chief Complaint  Patient presents with  . Stomach issues    states that he has been here for this issue before, feels full and feels like something is stuck in his throat    HPI:  Craig Flynn is a 55 y.o. male who  has a past medical history of Hypertension; Gout; Other specified disorder of intestines; Cardiomyopathy ([place]); CKD (chronic kidney disease), stage IV ([place]); OSA (obstructive sleep apnea); Anemia (06/2015); Chest pain (06/2015); and Arthritis. and presents today for a follow up.  1.) GERD - Previously seen in the office for feelings of fullness and feeling like something being in his throat. Diagnosed with GERD and started on pantoprazole. Reports taking the medication as prescribed and denies adverse side effects and notes that it did not help with the symptoms very much. Denies any dysphasia or difficulty eating. Describes a feeling of fullness. Denies any constipation, nausea, or vomiting. Does endorse some dizziness/lightheadness which waxes and wanes for the past couple of weeks.   No Known Allergies   Current Outpatient Prescriptions on File Prior to Visit  Medication Sig Dispense Refill  . allopurinol (ZYLOPRIM) 300 MG tablet Take 1 tablet (300 mg total) by mouth daily. 30 tablet 3  . aspirin 325 MG tablet Take 1 tablet (325 mg total) by mouth daily. 30 tablet 3  . atorvastatin (LIPITOR) 40 MG tablet Take 1 tablet (40 mg total) by mouth daily at 6 PM. 30 tablet 0  . carvedilol (COREG) 25 MG tablet Take 1 tablet (25 mg total) by mouth 2 (two) times daily. 60 tablet 6  . fenofibrate 54 MG tablet Take 1 tablet (54 mg total) by mouth daily. 30 tablet 11  . furosemide (LASIX) 80 MG tablet Take 1 tablet (80 mg total) by mouth 2 (two) times daily. 60 tablet 1  . hydrALAZINE (APRESOLINE) 25 MG tablet Take 1.5 tablets (37.5 mg total) by mouth 3 (three) times daily. 135 tablet 11  .  isosorbide mononitrate (IMDUR) 30 MG 24 hr tablet Take 1 tablet (30 mg total) by mouth daily. 30 tablet 11  . potassium chloride SA (K-DUR,KLOR-CON) 20 MEQ tablet Take 20 mEq by mouth 2 (two) times daily.    . predniSONE (DELTASONE) 10 MG tablet take 6 tablets by mouth daily X 4 DAYS, take 4 tablets by mouth d...  (REFER TO PRESCRIPTION NOTES).  0  . Vitamin D, Ergocalciferol, (DRISDOL) 50000 units CAPS capsule Take 2 capsules by mouth once a week.  0   No current facility-administered medications on file prior to visit.     Past Surgical History  Procedure Laterality Date  . Cholecystectomy    . Umbilical hernia repair      Past Medical History  Diagnosis Date  . Hypertension   . Gout   . 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 Cardiomyopathy ([place])     a. 10/2013: EF reduced to 35-40% b. 09/2014: EF improved to 55-60%, Grade 1 DD noted.  . CKD (chronic kidney disease), stage IV ([place])   . OSA (obstructive sleep apnea)     a. on CPAP  . Anemia 06/2015  . Chest pain 06/2015  . Arthritis      Review of Systems  Constitutional: Negative for fever and chills.  HENT: Negative for congestion.  Respiratory: Negative for chest tightness and shortness of breath.   Gastrointestinal: Positive for abdominal distention. Negative for abdominal pain, diarrhea, constipation and rectal pain.  Neurological: Positive for dizziness and light-headedness.      Objective:    BP 138/62 mmHg  Pulse 87  Temp(Src) 98.2 F (36.8 C) (Oral)  Resp 16  Ht 6\' 2"  (1.88 m)  Wt 322 lb (146.058 kg)  BMI 41.32 kg/m2  SpO2 98% Nursing note and vital signs reviewed.   Physical Exam  Constitutional: He is oriented to person, place, and time. He appears well-developed and well-nourished. No distress.  HENT:  Right Ear: Hearing, tympanic membrane, external ear and ear canal normal.  Left Ear: Hearing, tympanic membrane, external ear and ear  canal normal.  Nose: Nose normal.  Mouth/Throat: Uvula is midline, oropharynx is clear and moist and mucous membranes are normal.  Cardiovascular: Normal rate, regular rhythm, normal heart sounds and intact distal pulses.   Pulmonary/Chest: Effort normal and breath sounds normal.  Abdominal: Soft. Bowel sounds are normal. He exhibits distension. He exhibits no mass. There is no tenderness. There is no rebound and no guarding.  Neurological: He is alert and oriented to person, place, and time.  Skin: Skin is warm and dry.  Psychiatric: He has a normal mood and affect. His behavior is normal. Judgment and thought content normal.       Assessment & Plan:   Problem List Items Addressed This Visit      Other   Dizziness - Primary    Dizziness/lightheadedness of undetermined origin with no evidence of infections. Cannot rule out dehydration. Obtain before meals metabolic profile and CBC. Encouraged to change position slowly. Cannot rule out underlying heart failure or medication related although there have been no new medication changes recently. Follow-up pending blood work.      Relevant Orders   Basic Metabolic Panel (BMET) (Completed)   CBC (Completed)   Fullness of abdomen    Continues to experience fullness and thickness/throat unrelieved by pantoprazole. Denies dysphasia and is able to eat well. Reports his constipation is improved. Recommend probiotic. Refer to gastroenterology for possible endoscopy as cannot rule out a possible esophageal narrowing.      Relevant Orders   Ambulatory referral to Gastroenterology       I have discontinued Mr. Kanitz pantoprazole. I am also having him maintain his aspirin, carvedilol, furosemide, Vitamin D (Ergocalciferol), isosorbide mononitrate, fenofibrate, hydrALAZINE, allopurinol, potassium chloride SA, atorvastatin, and predniSONE.   Follow-up: No Follow-up on file.  Mauricio Po, FNP

## 2015-09-01 ENCOUNTER — Encounter: Payer: Self-pay | Admitting: Family

## 2015-09-03 ENCOUNTER — Encounter (HOSPITAL_COMMUNITY): Payer: Self-pay

## 2015-09-03 ENCOUNTER — Emergency Department (HOSPITAL_COMMUNITY)
Admission: EM | Admit: 2015-09-03 | Discharge: 2015-09-03 | Disposition: A | Discharge status: Home / Self Care | Payer: BLUE CROSS/BLUE SHIELD | Attending: Emergency Medicine | Admitting: Emergency Medicine

## 2015-09-03 ENCOUNTER — Emergency Department (HOSPITAL_COMMUNITY): Payer: BLUE CROSS/BLUE SHIELD

## 2015-09-03 DIAGNOSIS — F17210 Nicotine dependence, cigarettes, uncomplicated: Secondary | ICD-10-CM | POA: Diagnosis not present

## 2015-09-03 DIAGNOSIS — R55 Syncope and collapse: Secondary | ICD-10-CM | POA: Insufficient documentation

## 2015-09-03 DIAGNOSIS — N184 Chronic kidney disease, stage 4 (severe): Secondary | ICD-10-CM | POA: Diagnosis not present

## 2015-09-03 DIAGNOSIS — Z5321 Procedure and treatment not carried out due to patient leaving prior to being seen by health care provider: Secondary | ICD-10-CM | POA: Diagnosis not present

## 2015-09-03 DIAGNOSIS — I129 Hypertensive chronic kidney disease with stage 1 through stage 4 chronic kidney disease, or unspecified chronic kidney disease: Secondary | ICD-10-CM | POA: Diagnosis not present

## 2015-09-03 DIAGNOSIS — R0789 Other chest pain: Secondary | ICD-10-CM | POA: Insufficient documentation

## 2015-09-03 LAB — CBC
HCT: 34.8 % — ABNORMAL LOW (ref 39.0–52.0)
Hemoglobin: 10.3 g/dL — ABNORMAL LOW (ref 13.0–17.0)
MCH: 24.9 pg — ABNORMAL LOW (ref 26.0–34.0)
MCHC: 29.6 g/dL — ABNORMAL LOW (ref 30.0–36.0)
MCV: 84.3 fL (ref 78.0–100.0)
Platelets: 240 10*3/uL (ref 150–400)
RBC: 4.13 MIL/uL — ABNORMAL LOW (ref 4.22–5.81)
RDW: 16.1 % — ABNORMAL HIGH (ref 11.5–15.5)
WBC: 5.7 10*3/uL (ref 4.0–10.5)

## 2015-09-03 LAB — BASIC METABOLIC PANEL
Anion gap: 10 (ref 5–15)
BUN: 25 mg/dL — ABNORMAL HIGH (ref 6–20)
CO2: 24 mmol/L (ref 22–32)
Calcium: 9 mg/dL (ref 8.9–10.3)
Chloride: 106 mmol/L (ref 101–111)
Creatinine, Ser: 3.29 mg/dL — ABNORMAL HIGH (ref 0.61–1.24)
GFR calc Af Amer: 23 mL/min — ABNORMAL LOW (ref >60)
GFR calc non Af Amer: 20 mL/min — ABNORMAL LOW (ref >60)
Glucose, Bld: 131 mg/dL — ABNORMAL HIGH (ref 65–99)
Potassium: 3.3 mmol/L — ABNORMAL LOW (ref 3.5–5.1)
Sodium: 140 mmol/L (ref 135–145)

## 2015-09-03 LAB — I-STAT TROPONIN, ED: Troponin i, poc: 0.02 ng/mL (ref 0.00–0.08)

## 2015-09-03 LAB — BRAIN NATRIURETIC PEPTIDE: B Natriuretic Peptide: 18 pg/mL (ref 0.0–100.0)

## 2015-09-03 NOTE — ED Notes (Signed)
Unable to locate when called x3 for room, called patient on the phone and he stated he had left due to wait. Encouraged patient to return, but states he is not having pain any longer and would follow up with his PCP.

## 2015-09-03 NOTE — ED Notes (Signed)
Patient complains of chest discomfort intermittently for several weeks. Today while driving had near syncopal event. On arrival alert and oriented. No shortness of breath on arrival

## 2015-09-03 NOTE — ED Notes (Signed)
Unable to locate when called for re-check

## 2015-09-04 ENCOUNTER — Telehealth: Payer: Self-pay | Admitting: Pulmonary Disease

## 2015-09-04 NOTE — Telephone Encounter (Signed)
Opened in error-prm

## 2015-09-05 ENCOUNTER — Institutional Professional Consult (permissible substitution): Payer: BLUE CROSS/BLUE SHIELD | Admitting: Pulmonary Disease

## 2015-09-06 ENCOUNTER — Ambulatory Visit: Payer: BLUE CROSS/BLUE SHIELD | Admitting: Adult Health

## 2015-09-07 ENCOUNTER — Telehealth: Payer: Self-pay | Admitting: Physician Assistant

## 2015-09-07 NOTE — Telephone Encounter (Signed)
Pt had a sharp chest pain today and wants to know if he can take ASA for it.  Also has more edema in his legs and wants to know if he should change his Lasix dose. He sees a Water quality scientist.  Discussed fluid and Na+ restrictions. Pt does not feel he is getting too much water, is drinking mostly water, not over 1.5 L daily. He feels he is not getting too much sodium, eating a lot of salads. Working on getting healthier.  Has had these sharp chest pains occasionally, not exertional.  Advised him to call the Nephrologist to manage his edema and Lasix dosing.  Advised him ok to take aspirin as needed for the chest pain.  Lenoard Aden 09/07/2015 4:31 PM Beeper [phone]

## 2015-09-09 ENCOUNTER — Telehealth: Payer: Self-pay | Admitting: Family

## 2015-09-09 NOTE — Telephone Encounter (Signed)
PLEASE NOTE: All timestamps contained within this report are represented as [place] Standard Time. CONFIDENTIALTY NOTICE: This fax transmission is intended only for the addressee. It contains information that is legally privileged, confidential or otherwise protected from use or disclosure. If you are not the intended recipient, you are strictly prohibited from reviewing, disclosing, copying using or disseminating any of this information or taking any action in reliance on or regarding this information. If you have received this fax in error, please notify us immediately by telephone so that we can arrange for its return to [place]. Phone: [phone], Toll-Free: [phone], Fax: [phone] Page: 1 of 1 Call Id: [id number] [facility] [place] Patient Name: Craig Flynn DOB: 27-Mar-1960 Initial Comment Caller states he is vomiting and has blood in stool Nurse Assessment Nurse: Dimas Chyle, RN, Dellis Filbert Date/Time Eilene Ghazi Time): 09/09/2015 10:26:27 AM Confirm and document reason for call. If symptomatic, describe symptoms. You must click the next button to save text entered. ---Caller states he is vomiting and has blood in stool. Blood in stool started 3-4 days ago. Having vomiting for a while. Has the patient traveled out of the country within the last 30 days? ---No Does the patient have any new or worsening symptoms? ---Yes Will a triage be completed? ---Yes Related visit to physician within the last 2 weeks? ---No Does the PT have any chronic conditions? (i.e. diabetes, asthma, etc.) ---Yes List chronic conditions. ---HTN, CHF Is this a behavioral health or substance abuse call? ---No Guidelines Guideline Title Affirmed Question Affirmed Notes Rectal Bleeding MILD rectal bleeding (more than just a few drops or streaks) Vomiting MILD or MODERATE vomiting (e.g., 1 - 5 times / day) (all triage questions negative) Final  Disposition User See PCP When Office is Open (within 3 days) Dimas Chyle, RN, [employer] Referrals REFERRED TO PCP OFFICE Disagree/Comply: Comply Disagree/Comply: Comply

## 2015-09-09 NOTE — Telephone Encounter (Signed)
Appt has been made for 6/20...Craig Flynn

## 2015-09-10 ENCOUNTER — Ambulatory Visit (INDEPENDENT_AMBULATORY_CARE_PROVIDER_SITE_OTHER): Payer: BLUE CROSS/BLUE SHIELD | Admitting: Family

## 2015-09-10 ENCOUNTER — Telehealth: Payer: Self-pay | Admitting: Family

## 2015-09-10 ENCOUNTER — Encounter: Payer: Self-pay | Admitting: Family

## 2015-09-10 VITALS — BP 122/64 | HR 91 | Temp 97.8°F | Resp 18 | Ht 74.0 in | Wt 328.0 lb

## 2015-09-10 DIAGNOSIS — K219 Gastro-esophageal reflux disease without esophagitis: Secondary | ICD-10-CM

## 2015-09-10 DIAGNOSIS — K625 Hemorrhage of anus and rectum: Secondary | ICD-10-CM

## 2015-09-10 MED ORDER — RANITIDINE HCL 150 MG PO TABS: 150.0000 mg | ORAL_TABLET | Freq: Two times a day (BID) | ORAL | Status: DC

## 2015-09-10 NOTE — Telephone Encounter (Signed)
Patient states pharmacy told him to get a prescription of zantac.  Please send script to [facility] on [address].

## 2015-09-10 NOTE — Assessment & Plan Note (Signed)
Transient bright red rectal bleeding most likely from excessive wiping. Encouraged avoiding constipation and proper rectal hygiene to prevent further episodes or occurences. Continue to monitor and follow up as needed.

## 2015-09-10 NOTE — Patient Instructions (Signed)
Thank you for choosing [facility].  Summary/Instructions:  Please continue to use the Zantac to help control your reflux and follow up with Gastroenterology as scheduled.  Continue to avoid constipation with stool softners, ExLax or MiraLax as needed.   Good rectal hygiene with avoidance of excessive wiping.    If your symptoms worsen or fail to improve, please contact our office for further instruction, or in case of emergency go directly to the emergency room at the closest medical facility.   Food Choices for Gastroesophageal Reflux Disease, Adult When you have gastroesophageal reflux disease (GERD), the foods you eat and your eating habits are very important. Choosing the right foods can help ease the discomfort of GERD. WHAT GENERAL GUIDELINES DO I NEED TO FOLLOW?  Choose fruits, vegetables, whole grains, low-fat dairy products, and low-fat meat, fish, and poultry.  Limit fats such as oils, salad dressings, butter, nuts, and avocado.  Keep a food diary to identify foods that cause symptoms.  Avoid foods that cause reflux. These may be different for different people.  Eat frequent small meals instead of three large meals each day.  Eat your meals slowly, in a relaxed setting.  Limit fried foods.  Cook foods using methods other than frying.  Avoid drinking alcohol.  Avoid drinking large amounts of liquids with your meals.  Avoid bending over or lying down until 2-3 hours after eating. WHAT FOODS ARE NOT RECOMMENDED? The following are some foods and drinks that may worsen your symptoms: Vegetables Tomatoes. Tomato juice. Tomato and spaghetti sauce. Chili peppers. Onion and garlic. Horseradish. Fruits Oranges, grapefruit, and lemon (fruit and juice). Meats High-fat meats, fish, and poultry. This includes hot dogs, ribs, ham, sausage, salami, and bacon. Dairy Whole milk and chocolate milk. Sour cream. Cream. Butter. Ice cream. Cream cheese.  Beverages Coffee  and tea, with or without caffeine. Carbonated beverages or energy drinks. Condiments Hot sauce. Barbecue sauce.  Sweets/Desserts Chocolate and cocoa. Donuts. Peppermint and spearmint. Fats and Oils High-fat foods, including [place] fries and potato chips. Other Vinegar. Strong spices, such as black pepper, white pepper, red pepper, cayenne, curry powder, cloves, ginger, and chili powder. The items listed above may not be a complete list of foods and beverages to avoid. Contact your dietitian for more information.   This information is not intended to replace advice given to you by your health care provider. Make sure you discuss any questions you have with your health care provider.   Document Released: 03/09/2005 Document Revised: 03/30/2014 Document Reviewed: 01/11/2013 Elsevier Interactive Patient Education [employer].

## 2015-09-10 NOTE — Telephone Encounter (Signed)
Rx sent to pharmacy. Pt is aware.  

## 2015-09-10 NOTE — Assessment & Plan Note (Signed)
Indicates previously prescribed pantoprazole did not help very much and now currently taking ranitidine which is helping a little. Continues to experience feelings of globus pharyngis. This may be related to his GERD or possible his hernia. Continue current treatment with ranitidine and follow up with gastroenterology in 1 week as scheduled.

## 2015-09-10 NOTE — Progress Notes (Signed)
Pre visit review using our clinic review tool, if applicable. No additional management support is needed unless otherwise documented below in the visit note. 

## 2015-09-10 NOTE — Progress Notes (Signed)
Subjective:    Patient ID: Craig Flynn, male    DOB: [birth year], 55 y.o.   MRN: [id number]  Chief Complaint  Patient presents with  . Blood In Stools    states that when he wiped on saturday he was bleeding from his rectum or in his stool, having alot of acid reflux    HPI:  Craig Flynn is a 55 y.o. male who  has a past medical history of Hypertension; Gout; Other specified disorder of intestines; Cardiomyopathy ([place]); CKD (chronic kidney disease), stage IV ([place]); OSA (obstructive sleep apnea); Anemia (06/2015); Chest pain (06/2015); and Arthritis. and presents today for an office visit.   1.) Blood in stool -  This is a new problem. Associated symptom of bleeding around/from his rectum has been going on for about 3 days when he noted having what he describes as bright red blood and stopped without any treatments. Has not had any episodes since Saturday and believes that it is from excessive wiping. Endorses some constipation which is relieved by Exlax. Drinking about 1 L of water per day; eating fruits and vegetables. Denies any burning/itching. No previous history of hemorroids.   2.) Acid Reflux - Associated symptom of increased acid reflux has been going on for the last couple of weeks. Modifying factors include Zantac which has not helped very much. Has had treatment with Pantoprazole which did help a little. Expresses feelings of something being in his throat. Has appointment scheduled with gastroenterology for the symptoms in 1 week.   No Known Allergies   Current Outpatient Prescriptions on File Prior to Visit  Medication Sig Dispense Refill  . allopurinol (ZYLOPRIM) 300 MG tablet Take 1 tablet (300 mg total) by mouth daily. 30 tablet 3  . aspirin 325 MG tablet Take 1 tablet (325 mg total) by mouth daily. 30 tablet 3  . atorvastatin (LIPITOR) 40 MG tablet Take 1 tablet (40 mg total) by mouth daily at 6 PM. 30 tablet 0  . carvedilol (COREG) 25 MG tablet Take 1 tablet (25 mg  total) by mouth 2 (two) times daily. 60 tablet 6  . fenofibrate 54 MG tablet Take 1 tablet (54 mg total) by mouth daily. 30 tablet 11  . furosemide (LASIX) 80 MG tablet Take 1 tablet (80 mg total) by mouth 2 (two) times daily. 60 tablet 1  . hydrALAZINE (APRESOLINE) 25 MG tablet Take 1.5 tablets (37.5 mg total) by mouth 3 (three) times daily. 135 tablet 11  . isosorbide mononitrate (IMDUR) 30 MG 24 hr tablet Take 1 tablet (30 mg total) by mouth daily. 30 tablet 11  . potassium chloride SA (K-DUR,KLOR-CON) 20 MEQ tablet Take 20 mEq by mouth 2 (two) times daily.    . predniSONE (DELTASONE) 10 MG tablet take 6 tablets by mouth daily X 4 DAYS, take 4 tablets by mouth d...  (REFER TO PRESCRIPTION NOTES).  0  . Vitamin D, Ergocalciferol, (DRISDOL) 50000 units CAPS capsule Take 2 capsules by mouth once a week.  0   No current facility-administered medications on file prior to visit.     Review of Systems  Constitutional: Negative for fever and chills.  HENT:       Positive for globus pharyngeus.   Respiratory: Negative for chest tightness and shortness of breath.   Cardiovascular: Negative for chest pain, palpitations and leg swelling.  Gastrointestinal: Positive for constipation and anal bleeding. Negative for nausea, vomiting, abdominal pain, diarrhea, abdominal distention and rectal pain.  Objective:    BP 122/64 mmHg  Pulse 91  Temp(Src) 97.8 F (36.6 C) (Oral)  Resp 18  Ht 6\' 2"  (1.88 m)  Wt 328 lb (148.78 kg)  BMI 42.09 kg/m2  SpO2 98% Nursing note and vital signs reviewed.  Physical Exam  Constitutional: He is oriented to person, place, and time. He appears well-developed and well-nourished. No distress.  Cardiovascular: Normal rate, regular rhythm, normal heart sounds and intact distal pulses.   Pulmonary/Chest: Effort normal and breath sounds normal.  Abdominal: A hernia is present.  Genitourinary: Rectum normal. Rectal exam shows no external hemorrhoid, no fissure, no  mass, no tenderness and anal tone normal.  Neurological: He is alert and oriented to person, place, and time.  Skin: Skin is warm and dry.  Psychiatric: He has a normal mood and affect. His behavior is normal. Judgment and thought content normal.       Assessment & Plan:   Problem List Items Addressed This Visit      Digestive   GERD (gastroesophageal reflux disease)    Indicates previously prescribed pantoprazole did not help very much and now currently taking ranitidine which is helping a little. Continues to experience feelings of globus pharyngis. This may be related to his GERD or possible his hernia. Continue current treatment with ranitidine and follow up with gastroenterology in 1 week as scheduled.      Rectal bleeding - Primary    Transient bright red rectal bleeding most likely from excessive wiping. Encouraged avoiding constipation and proper rectal hygiene to prevent further episodes or occurences. Continue to monitor and follow up as needed.           I am having Mr. Thorell maintain his aspirin, carvedilol, furosemide, Vitamin D (Ergocalciferol), isosorbide mononitrate, fenofibrate, hydrALAZINE, allopurinol, potassium chloride SA, atorvastatin, and predniSONE.   Follow-up: Return if symptoms worsen or fail to improve.   Mauricio Po, FNP

## 2015-09-11 NOTE — Progress Notes (Signed)
Cardiology Office Note   Date:  09/12/2015   ID:  CANAN LANDGRAF, DOB 1960-07-04, MRN [id number]  PCP:  Mauricio Po, FNP  Cardiologist:   Minus Breeding, MD   Chief Complaint  Patient presents with  . Chest Pain      History of Present Illness: Craig Flynn is a 55 y.o. male who presents for evaluation of cardiomyopathy and hypertension. The patient has long-standing hypertension. He was seen a couple of years ago with mildly reduced ejection fraction of about 45%. He did not follow-up with cardiology. He was seen by [place] in consultation later when he was admitted with volume overload and hypertension. He was admitted again last year and was in the hospital in July.  Of note he had another echocardiogram in July 2016 and his EF was said to be about 55%.  Since [place] he has had a sleep study and has CPAP. He has been in the hospital again in December and most recently last month.  I reviewed the most recent hospital records.  He had chest pain but no evidence of ischemia.  He had a a perfusion defect that was fixed in the inferior wall with an EF of 47%.  No further work up was suggested.   He had chest pain again on 6/17 and called [place].   It turns out he was in the emergency room on 6/13. However, it took so long and actually stay to be evaluated. I did see that they drew blood. His potassium was low but he wasn't notified about this.  He returns today for follow up.  He describes the pain he was having a sharp shooting pain. It was on the left side but it only lasted for a few seconds. He felt like he might of had a very brief syncopal episode although he never passed out. He just felt like he was going to another was over. He's continued to have some shooting right-sided chest discomfort. However, he's otherwise been doing well. He denies any shortness of breath, PND or orthopnea. He's had no further chest discomfort. He's had no palpitations, presyncope or syncope. He has had increasing  lower extremity swelling and weight is a couple of pounds although he says he is watching his salt and drinking less than a liter fluid and not eating anything.   Past Medical History  Diagnosis Date  . Hypertension   . Gout   . 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 Cardiomyopathy ([place])     a. 10/2013: EF reduced to 35-40% b. 09/2014: EF improved to 55-60%, Grade 1 DD noted.  . CKD (chronic kidney disease), stage IV ([place])   . OSA (obstructive sleep apnea)     a. on CPAP  . Anemia 06/2015  . Chest pain 06/2015  . Arthritis     Past Surgical History  Procedure Laterality Date  . Cholecystectomy    . Umbilical hernia repair       Current Outpatient Prescriptions  Medication Sig Dispense Refill  . allopurinol (ZYLOPRIM) 300 MG tablet Take 1 tablet (300 mg total) by mouth daily. 30 tablet 3  . aspirin 325 MG tablet Take 1 tablet (325 mg total) by mouth daily. 30 tablet 3  . atorvastatin (LIPITOR) 40 MG tablet Take 1 tablet (40 mg total) by mouth daily at 6 PM. 30 tablet 0  . carvedilol (COREG) 25 MG tablet Take  1 tablet (25 mg total) by mouth 2 (two) times daily. 60 tablet 6  . fenofibrate 54 MG tablet Take 1 tablet (54 mg total) by mouth daily. 30 tablet 11  . furosemide (LASIX) 80 MG tablet Take 1 tablet (80 mg total) by mouth 2 (two) times daily. 60 tablet 1  . hydrALAZINE (APRESOLINE) 25 MG tablet Take 1.5 tablets (37.5 mg total) by mouth 3 (three) times daily. 135 tablet 11  . isosorbide mononitrate (IMDUR) 30 MG 24 hr tablet Take 1 tablet (30 mg total) by mouth daily. 30 tablet 11  . potassium chloride SA (K-DUR,KLOR-CON) 20 MEQ tablet Take 20 mEq by mouth 2 (two) times daily.    . ranitidine (ZANTAC) 150 MG tablet Take 1 tablet (150 mg total) by mouth 2 (two) times daily. 60 tablet 0  . Vitamin D, Ergocalciferol, (DRISDOL) 50000 units CAPS capsule Take 2 capsules by mouth once a week.  0   No current  facility-administered medications for this visit.    Allergies:   Review of patient's allergies indicates no known allergies.   ROS:  Please see the history of present illness.   Otherwise, review of systems are positive for none.   All other systems are reviewed and negative.    PHYSICAL EXAM: VS:  BP 118/60 mmHg  Pulse 94  Ht 6\' 2"  (1.88 m)  Wt 330 lb (149.687 kg)  BMI 42.35 kg/m2 , BMI Body mass index is 42.35 kg/(m^2). GENERAL:  Well appearing HEENT:  Pupils equal round and reactive, fundi not visualized, oral mucosa unremarkable NECK:  No jugular venous distention, waveform within normal limits, carotid upstroke brisk and symmetric, no bruits, no thyromegaly LYMPHATICS:  No cervical, inguinal adenopathy LUNGS:  Clear to auscultation bilaterally BACK:  No CVA tenderness CHEST:  Unremarkable HEART:  PMI not displaced or sustained,S1 and S2 within normal limits, no S3, no S4, no clicks, no rubs, no murmurs ABD:  Flat, positive bowel sounds normal in frequency in pitch, no bruits, no rebound, no guarding, no midline pulsatile mass, no hepatomegaly, no splenomegaly EXT:  2 plus pulses throughout, moderate edema chronic right greater than left leg swelling, no cyanosis no clubbing SKIN:  No rashes no nodules, chronic venous stasis changes    EKG:  EKG is not ordered today. I did review the EKG from 6/13.  Patient had sinus rhythm, rate 91, axis within normal limits, intervals within normal limits, no acute ST-T wave changes.    Recent Labs: 03/06/2015: ALT 20 09/03/2015: B Natriuretic Peptide 18.0; BUN 25*; Creatinine, Ser 3.29*; Hemoglobin 10.3*; Platelets 240; Potassium 3.3*; Sodium 140    Lipid Panel    Component Value Date/Time   CHOL 167 07/19/2015 0243   TRIG 215* 07/19/2015 0243   TRIG 98 03/03/2006 0910   HDL 19* 07/19/2015 0243   CHOLHDL 8.8 07/19/2015 0243   CHOLHDL 5.5 CALC 03/03/2006 0910   VLDL 43* 07/19/2015 0243   LDLCALC 105* 07/19/2015 0243      Wt  Readings from Last 3 Encounters:  09/12/15 330 lb (149.687 kg)  09/10/15 328 lb (148.78 kg)  09/03/15 322 lb (146.058 kg)      Other studies Reviewed: Additional studies/ records that were reviewed today include: Hospital records  Review of the above records demonstrates:  Please see elsewhere in the note.     ASSESSMENT AND PLAN:  CARDIOMYOPATHY:  His ejection fraction was improved at the most recent evaluation. He does have significant lower extremity swelling. This is increasing. However, this  doesn't likely represent heart failure more than venous insufficiency with his chronic renal insufficiency. He is following up with his nephrologist. He understands salt and fluid restriction. He does have compression stockings though he is not wearing them. He's instructed to keep his feet elevated.  HTN:  This is being managed in the context of treating his CHF. The blood pressure is at target. No change in medications is indicated. We will continue with therapeutic lifestyle changes (TLC).  OBESITY:  The patient understands the need to lose weight with diet and exercise. We have discussed specific strategies for this in great detail in the past.   CKD:  He is due to follow-up with nephrology.  EDEMA:  I reviewed his previous Doppler and he has no history of DVTs. I suspect he has some venous incompetence on the right. We talked about compression stockings again salt and fluid management.  CHEST PAIN:  I reviewed the ED labs.  I reviewed the telephone note.  Since those events he's had no further chest pain. He's had her recent workup with a fixed defect with no ischemia on perfusion study. Given all of this no further evaluation is warranted unless he has recurrent symptoms.  HYPOKALEMIA:  He needs follow-up labs scheduled these.   Current medicines are reviewed at length with the patient today.  The patient does not have concerns regarding medicines.  The following changes have been made:   As above  Labs/ tests ordered today include: None  Orders Placed This Encounter  Procedures  . Basic Metabolic Panel (BMET)     Disposition:   FU with APP in six months.    Signed, Minus Breeding, MD  09/12/2015 4:51 PM    [facility]

## 2015-09-12 ENCOUNTER — Encounter: Payer: Self-pay | Admitting: Cardiology

## 2015-09-12 ENCOUNTER — Ambulatory Visit (INDEPENDENT_AMBULATORY_CARE_PROVIDER_SITE_OTHER): Payer: BLUE CROSS/BLUE SHIELD | Admitting: Cardiology

## 2015-09-12 VITALS — BP 118/60 | HR 94 | Ht 74.0 in | Wt 330.0 lb

## 2015-09-12 DIAGNOSIS — Z79899 Other long term (current) drug therapy: Secondary | ICD-10-CM

## 2015-09-12 DIAGNOSIS — R079 Chest pain, unspecified: Secondary | ICD-10-CM | POA: Diagnosis not present

## 2015-09-12 LAB — BASIC METABOLIC PANEL
BUN: 28 mg/dL — ABNORMAL HIGH (ref 7–25)
CO2: 24 mmol/L (ref 20–31)
Calcium: 9 mg/dL (ref 8.6–10.3)
Chloride: 102 mmol/L (ref 98–110)
Creat: 4.09 mg/dL — ABNORMAL HIGH (ref 0.70–1.33)
Glucose, Bld: 77 mg/dL (ref 65–99)
Potassium: 3.6 mmol/L (ref 3.5–5.3)
Sodium: 140 mmol/L (ref 135–146)

## 2015-09-12 NOTE — Patient Instructions (Signed)
Your physician wants you to follow-up in: 6 months. You will receive a reminder letter in the mail two months in advance. If you don't receive a letter, please call our office to schedule the follow-up appointment.   Your physician recommends that you return for lab work in: [facility]

## 2015-09-17 ENCOUNTER — Ambulatory Visit (INDEPENDENT_AMBULATORY_CARE_PROVIDER_SITE_OTHER): Payer: BLUE CROSS/BLUE SHIELD | Admitting: Physician Assistant

## 2015-09-17 ENCOUNTER — Encounter: Payer: Self-pay | Admitting: Physician Assistant

## 2015-09-17 VITALS — BP 104/66 | HR 88 | Ht 74.0 in | Wt 326.8 lb

## 2015-09-17 DIAGNOSIS — K5909 Other constipation: Secondary | ICD-10-CM | POA: Diagnosis not present

## 2015-09-17 DIAGNOSIS — R109 Unspecified abdominal pain: Secondary | ICD-10-CM | POA: Diagnosis not present

## 2015-09-17 DIAGNOSIS — Z86010 Personal history of colonic polyps: Secondary | ICD-10-CM

## 2015-09-17 DIAGNOSIS — K219 Gastro-esophageal reflux disease without esophagitis: Secondary | ICD-10-CM

## 2015-09-17 DIAGNOSIS — Z1211 Encounter for screening for malignant neoplasm of colon: Secondary | ICD-10-CM | POA: Diagnosis not present

## 2015-09-17 MED ORDER — NA SULFATE-K SULFATE-MG SULF 17.5-3.13-1.6 GM/177ML PO SOLN: 1.0000 | Freq: Once | ORAL | Status: AC

## 2015-09-17 MED ORDER — RANITIDINE HCL 150 MG PO TABS: 150.0000 mg | ORAL_TABLET | Freq: Two times a day (BID) | ORAL | Status: DC

## 2015-09-17 NOTE — Patient Instructions (Addendum)
You have been given a separate informational sheet regarding your tobacco use, the importance of quitting and local resources to help you quit. We sent a prescription to [place], [place]. 1. Zantac 150 mg, take 1 tab by mouth twice daily.  Take Miralax 17 gram in 8 oz of water daily or every other day. Coupon provided. You can get this at [employer], [place], [employer], [employer], [employer].   You have been scheduled for a colonoscopy. Please follow written instructions given to you at your visit today.  Please pick up your prep supplies at the pharmacy within the next 1-3 days. If you use inhalers (even only as needed), please bring them with you on the day of your procedure. Your physician has requested that you go to www.startemmi.com and enter the access code given to you at your visit today. This web site gives a general overview about your procedure. However, you should still follow specific instructions given to you by our office regarding your preparation for the procedure.

## 2015-09-17 NOTE — Progress Notes (Signed)
Patient ID: Craig Flynn, male   DOB: 1961/03/03, 55 y.o.   MRN: [id number]   Subjective:    Patient ID: Craig Flynn, male    DOB: [birth year], 55 y.o.   MRN: [id number]  HPI Craig Flynn is a 55 year old African-American male known remotely to Craig Flynn from colonoscopy. He is currently referred by Craig Flynn Nurse practitioner/primary care for  consideration of EGD. Patient has history of cardiomyopathy with EF of 55-60%, hypertension, obstructive sleep apnea and chronic kidney disease stage IV. He has not had any prior EGD. He says he has been having if illness feeling of "fullness" in his throat off and on over the past few months. Not aware of any sour brash, denies any dysphagia or odynophagia ,and does not have any regular heartburn or indigestion. Appetite has  been fine, his weight has been stable. Bowel movements have been fairly regular though it sounds as if he occasionally is getting constipated. Not noted any melena or hematochezia. He says he's been having some fullness periodically and discomfort in the left side of his abdomen. Recently when he had  This discomfort ,he took a laxative and purged his bowel and this seemed to help. Patient just started on Zantac 150 by mouth twice a day no change in symptoms as yet. Last colonoscopy was done in 2008 with removal of 2 polyps both of which were adenomatous. He was to have 5 year interval follow-up.  Review of Systems Pertinent positive and negative review of systems were noted in the above HPI section.  All other review of systems was otherwise negative.  Outpatient Encounter Prescriptions as of 09/17/2015  Medication Sig  . allopurinol (ZYLOPRIM) 300 MG tablet Take 1 tablet (300 mg total) by mouth daily.  Marland Kitchen aspirin 325 MG tablet Take 1 tablet (325 mg total) by mouth daily.  Marland Kitchen atorvastatin (LIPITOR) 40 MG tablet Take 1 tablet (40 mg total) by mouth daily at 6 PM.  . carvedilol (COREG) 25 MG tablet Take 1 tablet (25 mg total) by mouth 2  (two) times daily.  . fenofibrate 54 MG tablet Take 1 tablet (54 mg total) by mouth daily.  . furosemide (LASIX) 80 MG tablet Take 1 tablet (80 mg total) by mouth 2 (two) times daily.  . hydrALAZINE (APRESOLINE) 25 MG tablet Take 1.5 tablets (37.5 mg total) by mouth 3 (three) times daily.  . isosorbide mononitrate (IMDUR) 30 MG 24 hr tablet Take 1 tablet (30 mg total) by mouth daily.  . potassium chloride SA (K-DUR,KLOR-CON) 20 MEQ tablet Take 20 mEq by mouth 2 (two) times daily.  . ranitidine (ZANTAC) 150 MG tablet Take 1 tablet (150 mg total) by mouth 2 (two) times daily.  . Vitamin D, Ergocalciferol, (DRISDOL) 50000 units CAPS capsule Take 2 capsules by mouth once a week.  . [DISCONTINUED] ranitidine (ZANTAC) 150 MG tablet Take 1 tablet (150 mg total) by mouth 2 (two) times daily.  . Na Sulfate-K Sulfate-Mg Sulf 17.5-3.13-1.6 GM/180ML SOLN Take 1 kit by mouth once.   No facility-administered encounter medications on file as of 09/17/2015.   No Known Allergies Patient Active Problem List   Diagnosis Date Noted  . Rectal bleeding 09/10/2015  . Dizziness 08/27/2015  . Fullness of abdomen 08/27/2015  . Chronic systolic heart failure ([place]) 08/01/2015  . GERD (gastroesophageal reflux disease) 07/29/2015  . Pain in the chest   . CKD (chronic kidney disease)   . Osteoarthritis 05/03/2015  . Hyperlipidemia 02/26/2015  . Acute bronchitis 02/26/2015  .  Diastolic dysfunction 74/25/9563  . Tobacco abuse 02/26/2015  . Atypical chest pain 02/25/2015  . Neck muscle spasm 01/15/2015  . OSA (obstructive sleep apnea) 12/27/2014  . Snoring 11/12/2014  . Routine general medical examination at a health care facility 11/12/2014  . Anemia in chronic kidney disease 11/12/2014  . Left knee pain 09/22/2014  . Chest pain 09/21/2014  . AKI (acute kidney injury) ([place]) 09/21/2014  . Systolic CHF ([place]) 87/56/4332  . Paresthesia of left leg 09/21/2014  . Acute systolic CHF (congestive heart failure) ([place])  11/17/2013  . CKD (chronic kidney disease) stage 4, GFR 15-29 ml/min ([facility]) 11/17/2013  . Morbid obesity ([place]) 11/17/2013  . Diabetes mellitus ([place]) 12/12/2011  . Acute on chronic renal failure ([place]) 12/11/2011  . Hypokalemia 12/11/2011  . HTN (hypertension) 12/11/2011  . Dyslipidemia 12/31/2006  . Gout 12/31/2006  . Essential hypertension 12/31/2006  . 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 BACKACHE NOS 12/29/2006   Social History   Social History  . Marital Status: Legally Separated    Spouse Name: N/A  . Number of Children: 2  . Years of Education: 12   Occupational History  . Disability     Knees    Social History Main Topics  . Smoking status: Current Every Day Smoker -- 0.50 packs/day for 30 years    Types: Cigarettes  . Smokeless tobacco: Never Used  . Alcohol Use: No  . Drug Use: No  . Sexual Activity: Not on file   Other Topics Concern  . Not on file   Social History Narrative   Fun: Golf, basketball    Denies religious beliefs effecting health care.     Mr. Craig Flynn family history includes Healthy in his maternal grandfather, paternal grandfather, and paternal grandmother; Hypertension in his maternal grandmother; Kidney disease in his maternal grandmother; Pneumonia in his father; Stroke in his mother. There is no history of CAD.      Objective:    Filed Vitals:   09/17/15 0931  BP: 104/66  Pulse: 88    Physical Exam  well-developed African-American male in no acute distress, blood pressure 104/66 pulse 88 height 6 foot 2 weight 326, BMI 41.9. HEENT; nontraumatic normocephalic EOMI PERRLA sclera anicteric, Cardiovascular; regular rate and rhythm with S1-S2 no murmur or gallop, Pulmonary ;clear bilaterally, Abdomen ;obese soft nontender nondistended bowel sounds are active there is no palpable mass or hepatosplenomegaly, Rectal ;exam not done, Extremities; no clubbing cyanosis or edema skin warm and dry, Neuropsych; mood and affect  appropriate     Assessment & Flynn:   #83 55 year old African-American male with recent complaints of fullness and esophagus, no distinct dysphagia. Symptoms may be secondary to GERD, rule out occult lesion, rule out early stricture #2 history of adenomatous colon polyps overdue for follow-up, last colonoscopy 2008 #3 cardiomyopathy EF 55% #4 hypertension #5 morbid obesity BMI 41.9 #6 chronic kidney disease stage IV  Flynn; Patient will be scheduled for EGD and colonoscopy with Craig Flynn. Procedures discussed in detail with patient including risks and benefits and he is agreeable to proceed. He will continue Zantac 150 mg by mouth twice a day We discussed MiraLAX is a good choice for laxative and advised he may want to try this 17 g in 8 ounces of water daily or every other day as needed.[place] Nurse [place] PA-C 09/17/2015   Cc: [place], FNP

## 2015-09-17 NOTE — Progress Notes (Signed)
Reviewed and agree with management plan.  Malcolm T. Stark, MD FACG 

## 2015-09-18 ENCOUNTER — Other Ambulatory Visit: Payer: Self-pay

## 2015-09-18 MED ORDER — CARVEDILOL 25 MG PO TABS: 25.0000 mg | ORAL_TABLET | Freq: Two times a day (BID) | ORAL | Status: DC

## 2015-09-19 DIAGNOSIS — N2581 Secondary hyperparathyroidism of renal origin: Secondary | ICD-10-CM | POA: Diagnosis not present

## 2015-09-19 DIAGNOSIS — I129 Hypertensive chronic kidney disease with stage 1 through stage 4 chronic kidney disease, or unspecified chronic kidney disease: Secondary | ICD-10-CM | POA: Diagnosis not present

## 2015-09-19 DIAGNOSIS — N184 Chronic kidney disease, stage 4 (severe): Secondary | ICD-10-CM | POA: Diagnosis not present

## 2015-09-19 DIAGNOSIS — D649 Anemia, unspecified: Secondary | ICD-10-CM | POA: Diagnosis not present

## 2015-09-23 ENCOUNTER — Encounter (HOSPITAL_COMMUNITY): Payer: Self-pay | Admitting: Emergency Medicine

## 2015-09-23 ENCOUNTER — Emergency Department (HOSPITAL_COMMUNITY)
Admission: EM | Admit: 2015-09-23 | Discharge: 2015-09-23 | Disposition: A | Discharge status: Home / Self Care | Payer: BLUE CROSS/BLUE SHIELD | Attending: Emergency Medicine | Admitting: Emergency Medicine

## 2015-09-23 ENCOUNTER — Emergency Department (HOSPITAL_COMMUNITY): Payer: BLUE CROSS/BLUE SHIELD

## 2015-09-23 DIAGNOSIS — G4733 Obstructive sleep apnea (adult) (pediatric): Secondary | ICD-10-CM | POA: Diagnosis not present

## 2015-09-23 DIAGNOSIS — R748 Abnormal levels of other serum enzymes: Secondary | ICD-10-CM | POA: Insufficient documentation

## 2015-09-23 DIAGNOSIS — R42 Dizziness and giddiness: Secondary | ICD-10-CM

## 2015-09-23 DIAGNOSIS — N184 Chronic kidney disease, stage 4 (severe): Secondary | ICD-10-CM | POA: Insufficient documentation

## 2015-09-23 DIAGNOSIS — F17210 Nicotine dependence, cigarettes, uncomplicated: Secondary | ICD-10-CM | POA: Insufficient documentation

## 2015-09-23 DIAGNOSIS — N179 Acute kidney failure, unspecified: Secondary | ICD-10-CM | POA: Diagnosis not present

## 2015-09-23 DIAGNOSIS — R0789 Other chest pain: Secondary | ICD-10-CM | POA: Diagnosis not present

## 2015-09-23 DIAGNOSIS — Z79899 Other long term (current) drug therapy: Secondary | ICD-10-CM | POA: Insufficient documentation

## 2015-09-23 DIAGNOSIS — R7989 Other specified abnormal findings of blood chemistry: Secondary | ICD-10-CM

## 2015-09-23 DIAGNOSIS — I129 Hypertensive chronic kidney disease with stage 1 through stage 4 chronic kidney disease, or unspecified chronic kidney disease: Secondary | ICD-10-CM | POA: Insufficient documentation

## 2015-09-23 DIAGNOSIS — R079 Chest pain, unspecified: Secondary | ICD-10-CM | POA: Diagnosis not present

## 2015-09-23 DIAGNOSIS — Z7982 Long term (current) use of aspirin: Secondary | ICD-10-CM | POA: Diagnosis not present

## 2015-09-23 DIAGNOSIS — I10 Essential (primary) hypertension: Secondary | ICD-10-CM

## 2015-09-23 LAB — BASIC METABOLIC PANEL
Anion gap: 9 (ref 5–15)
BUN: 38 mg/dL — ABNORMAL HIGH (ref 6–20)
CO2: 24 mmol/L (ref 22–32)
Calcium: 9.7 mg/dL (ref 8.9–10.3)
Chloride: 106 mmol/L (ref 101–111)
Creatinine, Ser: 5.36 mg/dL — ABNORMAL HIGH (ref 0.61–1.24)
GFR calc Af Amer: 13 mL/min — ABNORMAL LOW (ref >60)
GFR calc non Af Amer: 11 mL/min — ABNORMAL LOW (ref >60)
Glucose, Bld: 116 mg/dL — ABNORMAL HIGH (ref 65–99)
Potassium: 4.3 mmol/L (ref 3.5–5.1)
Sodium: 139 mmol/L (ref 135–145)

## 2015-09-23 LAB — CBC
HCT: 35.5 % — ABNORMAL LOW (ref 39.0–52.0)
Hemoglobin: 11.1 g/dL — ABNORMAL LOW (ref 13.0–17.0)
MCH: 26.7 pg (ref 26.0–34.0)
MCHC: 31.3 g/dL (ref 30.0–36.0)
MCV: 85.5 fL (ref 78.0–100.0)
Platelets: 241 10*3/uL (ref 150–400)
RBC: 4.15 MIL/uL — ABNORMAL LOW (ref 4.22–5.81)
RDW: 16.2 % — ABNORMAL HIGH (ref 11.5–15.5)
WBC: 5.9 10*3/uL (ref 4.0–10.5)

## 2015-09-23 LAB — I-STAT TROPONIN, ED: Troponin i, poc: 0.01 ng/mL (ref 0.00–0.08)

## 2015-09-23 MED ORDER — FUROSEMIDE 40 MG PO TABS: 120.0000 mg | ORAL_TABLET | Freq: Two times a day (BID) | ORAL | Status: DC

## 2015-09-23 NOTE — Discharge Instructions (Signed)
Nonspecific Chest Pain  Chest pain can be caused by many different conditions. There is always a chance that your pain could be related to something serious, such as a heart attack or a blood clot in your lungs. Chest pain can also be caused by conditions that are not life-threatening. If you have chest pain, it is very important to follow up with your health care provider. CAUSES  Chest pain can be caused by:  Heartburn.  Pneumonia or bronchitis.  Anxiety or stress.  Inflammation around your heart (pericarditis) or lung (pleuritis or pleurisy).  A blood clot in your lung.  A collapsed lung (pneumothorax). It can develop suddenly on its own (spontaneous pneumothorax) or from trauma to the chest.  Shingles infection (varicella-zoster virus).  Heart attack.  Damage to the bones, muscles, and cartilage that make up your chest wall. This can include:  Bruised bones due to injury.  Strained muscles or cartilage due to frequent or repeated coughing or overwork.  Fracture to one or more ribs.  Sore cartilage due to inflammation (costochondritis). RISK FACTORS  Risk factors for chest pain may include:  Activities that increase your risk for trauma or injury to your chest.  Respiratory infections or conditions that cause frequent coughing.  Medical conditions or overeating that can cause heartburn.  Heart disease or family history of heart disease.  Conditions or health behaviors that increase your risk of developing a blood clot.  Having had chicken pox (varicella zoster). SIGNS AND SYMPTOMS Chest pain can feel like:  Burning or tingling on the surface of your chest or deep in your chest.  Crushing, pressure, aching, or squeezing pain.  Dull or sharp pain that is worse when you move, cough, or take a deep breath.  Pain that is also felt in your back, neck, shoulder, or arm, or pain that spreads to any of these areas. Your chest pain may come and go, or it may stay  constant. DIAGNOSIS Lab tests or other studies may be needed to find the cause of your pain. Your health care provider may have you take a test called an ambulatory ECG (electrocardiogram). An ECG records your heartbeat patterns at the time the test is performed. You may also have other tests, such as:  Transthoracic echocardiogram (TTE). During echocardiography, sound waves are used to create a picture of all of the heart structures and to look at how blood flows through your heart.  Transesophageal echocardiogram (TEE).This is a more advanced imaging test that obtains images from inside your body. It allows your health care provider to see your heart in finer detail.  Cardiac monitoring. This allows your health care provider to monitor your heart rate and rhythm in real time.  Holter monitor. This is a portable device that records your heartbeat and can help to diagnose abnormal heartbeats. It allows your health care provider to track your heart activity for several days, if needed.  Stress tests. These can be done through exercise or by taking medicine that makes your heart beat more quickly.  Blood tests.  Imaging tests. TREATMENT  Your treatment depends on what is causing your chest pain. Treatment may include:  Medicines. These may include:  Acid blockers for heartburn.  Anti-inflammatory medicine.  Pain medicine for inflammatory conditions.  Antibiotic medicine, if an infection is present.  Medicines to dissolve blood clots.  Medicines to treat coronary artery disease.  Supportive care for conditions that do not require medicines. This may include:  Resting.  Applying heat  or cold packs to injured areas.  Limiting activities until pain decreases. HOME CARE INSTRUCTIONS  If you were prescribed an antibiotic medicine, finish it all even if you start to feel better.  Avoid any activities that bring on chest pain.  Do not use any tobacco products, including  cigarettes, chewing tobacco, or electronic cigarettes. If you need help quitting, ask your health care provider.  Do not drink alcohol.  Take medicines only as directed by your health care provider.  Keep all follow-up visits as directed by your health care provider. This is important. This includes any further testing if your chest pain does not go away.  If heartburn is the cause for your chest pain, you may be told to keep your head raised (elevated) while sleeping. This reduces the chance that acid will go from your stomach into your esophagus.  Make lifestyle changes as directed by your health care provider. These may include:  Getting regular exercise. Ask your health care provider to suggest some activities that are safe for you.  Eating a heart-healthy diet. A registered dietitian can help you to learn healthy eating options.  Maintaining a healthy weight.  Managing diabetes, if necessary.  Reducing stress. SEEK MEDICAL CARE IF:  Your chest pain does not go away after treatment.  You have a rash with blisters on your chest.  You have a fever. SEEK IMMEDIATE MEDICAL CARE IF:   Your chest pain is worse.  You have an increasing cough, or you cough up blood.  You have severe abdominal pain.  You have severe weakness.  You faint.  You have chills.  You have sudden, unexplained chest discomfort.  You have sudden, unexplained discomfort in your arms, back, neck, or jaw.  You have shortness of breath at any time.  You suddenly start to sweat, or your skin gets clammy.  You feel nauseous or you vomit.  You suddenly feel light-headed or dizzy.  Your heart begins to beat quickly, or it feels like it is skipping beats. These symptoms may represent a serious problem that is an emergency. Do not wait to see if the symptoms will go away. Get medical help right away. Call your local emergency services (911 in the [place] Do not drive yourself to the hospital.   This  information is not intended to replace advice given to you by your health care provider. Make sure you discuss any questions you have with your health care provider.   Document Released: 12/17/2004 Document Revised: 03/30/2014 Document Reviewed: 10/13/2013 Elsevier Interactive Patient Education 2016 [employer].  Near-Syncope Near-syncope (commonly known as near fainting) is sudden weakness, dizziness, or feeling like you might pass out. During an episode of near-syncope, you may also develop pale skin, have tunnel vision, or feel sick to your stomach (nauseous). Near-syncope may occur when getting up after sitting or while standing for a long time. It is caused by a sudden decrease in blood flow to the brain. This decrease can result from various causes or triggers, most of which are not serious. However, because near-syncope can sometimes be a sign of something serious, a medical evaluation is required. The specific cause is often not determined. HOME CARE INSTRUCTIONS  Monitor your condition for any changes. The following actions may help to alleviate any discomfort you are experiencing:  Have someone stay with you until you feel stable.  Lie down right away and prop your feet up if you start feeling like you might faint. Breathe deeply and steadily.  Wait until all the symptoms have passed. Most of these episodes last only a few minutes. You may feel tired for several hours.   Drink enough fluids to keep your urine clear or pale yellow.   If you are taking blood pressure or heart medicine, get up slowly when seated or lying down. Take several minutes to sit and then stand. This can reduce dizziness.  Follow up with your health care provider as directed. SEEK IMMEDIATE MEDICAL CARE IF:   You have a severe headache.   You have unusual pain in the chest, abdomen, or back.   You are bleeding from the mouth or rectum, or you have black or tarry stool.   You have an irregular or very  fast heartbeat.   You have repeated fainting or have seizure-like jerking during an episode.   You faint when sitting or lying down.   You have confusion.   You have difficulty walking.   You have severe weakness.   You have vision problems.  MAKE SURE YOU:   Understand these instructions.  Will watch your condition.  Will get help right away if you are not doing well or get worse.   This information is not intended to replace advice given to you by your health care provider. Make sure you discuss any questions you have with your health care provider.   Document Released: 03/09/2005 Document Revised: 03/14/2013 Document Reviewed: 08/12/2012 Elsevier Interactive Patient Education [employer].

## 2015-09-23 NOTE — Consult Note (Signed)
CARDIOLOGY CONSULT NOTE     Patient ID: Craig Flynn MRN: [id number] DOB/AGE: Aug 17, 1960 55 y.o.  Admit date: 09/23/2015 Referring Physician Dorie Rank MD Primary Physician Mauricio Po, FNP Primary Cardiologist Marijo File MD Reason for Consultation chest pain.  HPI: Pleasant 55 yo BM with CKD, HTN, OSA, and hyperlipidemia presents to the ED with complaints of chest pain. He states he has been having intermittent right precordial chest pain that lasts 2-3 hours then goes away. He states it usually starts in the upper abdomen then he gets a lump in his throat and gags or chokes. His right sided chest pain is worse when he chokes or coughs. He has not tried [place] or Ntg. Notes some mild dyspnea this past week. The only change is that he has been drinking white grape juice. He states he missed his lasix for a few days but is now back on it. He does have chronic edema. Weight is actually down some today.  He was seen recently by Dr. Percival Spanish on 6/22. He has a history of cardiomyopathy with EF 45% a couple of years ago. In July 2016 Echo showed EF of 55%. He was hospitalized in December and again in April. He had a nuclear stress test which showed a fixed inferior defect without ischemia and EF 47%. When seen by Dr. Percival Spanish his chest pain was felt to be atypical and no further cardiac work up warranted. He does have a history of OSA and is on CPAP. He was seen recently by GI with plans for upper EGD and colonoscopy in one month. He is followed by Dr. Lorrene Reid for CKD with creatinines typically between 3.5-4. He does not use NSAIDs. Recent fluid intake has been good.   Past Medical History  Diagnosis Date  . Hypertension   . Gout   . 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 Cardiomyopathy ([place])     a. 10/2013: EF reduced to 35-40% b. 09/2014: EF improved to 55-60%, Grade 1 DD noted.  . CKD (chronic kidney disease), stage IV ([place])     . OSA (obstructive sleep apnea)     a. on CPAP  . Anemia 06/2015  . Chest pain 06/2015  . Arthritis     Family History  Problem Relation Age of Onset  . Stroke Mother   . Pneumonia Father     died of Pneumonia x3  . CAD Neg Hx   . Kidney disease Maternal Grandmother   . Hypertension Maternal Grandmother   . Healthy Maternal Grandfather   . Healthy Paternal Grandmother   . Healthy Paternal Grandfather     Social History   Social History  . Marital Status: Legally Separated    Spouse Name: N/A  . Number of Children: 2  . Years of Education: 12   Occupational History  . Disability     Knees    Social History Main Topics  . Smoking status: Current Every Day Smoker -- 0.50 packs/day for 30 years    Types: Cigarettes  . Smokeless tobacco: Never Used  . Alcohol Use: No  . Drug Use: No  . Sexual Activity: Not on file   Other Topics Concern  . Not on file   Social History Narrative   Fun: Golf, basketball    Denies religious beliefs effecting health care.     Past Surgical History  Procedure Laterality Date  . Cholecystectomy    .  Umbilical hernia repair          Medication List    ASK your doctor about these medications        acetaminophen 500 MG tablet  Commonly known as:  TYLENOL  Take 1,000 mg by mouth every 6 (six) hours as needed (pain).     allopurinol 300 MG tablet  Commonly known as:  ZYLOPRIM  Take 1 tablet (300 mg total) by mouth daily.     aspirin EC 81 MG tablet  Take 81 mg by mouth daily.     aspirin 325 MG tablet  Take 1 tablet (325 mg total) by mouth daily.     atorvastatin 40 MG tablet  Commonly known as:  LIPITOR  Take 1 tablet (40 mg total) by mouth daily at 6 PM.     calcitRIOL 0.25 MCG capsule  Commonly known as:  ROCALTROL  Take 0.25 mcg by mouth every Monday, Wednesday, and Friday.     carvedilol 25 MG tablet  Commonly known as:  COREG  Take 1 tablet (25 mg total) by mouth 2 (two) times daily.     fenofibrate 54 MG  tablet  Take 1 tablet (54 mg total) by mouth daily.     furosemide 80 MG tablet  Commonly known as:  LASIX  Take 1 tablet (80 mg total) by mouth 2 (two) times daily.     hydrALAZINE 25 MG tablet  Commonly known as:  APRESOLINE  Take 1.5 tablets (37.5 mg total) by mouth 3 (three) times daily.     isosorbide mononitrate 30 MG 24 hr tablet  Commonly known as:  IMDUR  Take 1 tablet (30 mg total) by mouth daily.     Na Sulfate-K Sulfate-Mg Sulf 17.5-3.13-1.6 GM/180ML Soln  Take 1 kit by mouth once.     pantoprazole 40 MG tablet  Commonly known as:  PROTONIX  Take 40 mg by mouth daily.     potassium chloride SA 20 MEQ tablet  Commonly known as:  K-DUR,KLOR-CON  Take 20 mEq by mouth 2 (two) times daily.     ranitidine 150 MG tablet  Commonly known as:  ZANTAC  Take 1 tablet (150 mg total) by mouth 2 (two) times daily.         ROS: As noted in HPI. All other systems are reviewed and are negative unless otherwise mentioned.   Physical Exam: Blood pressure 127/81, pulse 75, temperature 98.4 F (36.9 C), temperature source Oral, resp. rate 14, height 6' 2"  (1.88 m), weight 322 lb 2 oz (146.115 kg), SpO2 100 %. Current Weight  09/23/15 322 lb 2 oz (146.115 kg)  09/17/15 326 lb 12.8 oz (148.236 kg)  09/12/15 330 lb (149.687 kg)  GENERAL:  Well appearing, obese BM in NAD HEENT:  PERRL, EOMI, sclera are clear. Oropharynx is clear. NECK:  No jugular venous distention, carotid upstroke brisk and symmetric, no bruits, no thyromegaly or adenopathy LUNGS:  Clear to auscultation bilaterally CHEST:  Unremarkable, no tenderness to palpation. HEART:  RRR,  PMI not displaced or sustained,S1 and S2 within normal limits, no S3, no S4: no clicks, no rubs, no murmurs ABD:  Soft, nontender. BS +, no masses or bruits. No hepatomegaly, no splenomegaly EXT:  2 + pulses throughout, 2+ edema, no cyanosis no clubbing SKIN:  Warm and dry.  No rashes NEURO:  Alert and oriented x 3. Cranial nerves II  through XII intact. PSYCH:  Cognitively intact    Labs:   Lab Results  Component  Value Date   WBC 5.9 09/23/2015   HGB 11.1* 09/23/2015   HCT 35.5* 09/23/2015   MCV 85.5 09/23/2015   PLT 241 09/23/2015    Recent Labs Lab 09/23/15 1312  NA 139  K 4.3  CL 106  CO2 24  BUN 38*  CREATININE 5.36*  CALCIUM 9.7  GLUCOSE 116*   Lab Results  Component Value Date   TROPONINI <0.03 07/18/2015   TROPONINI <0.03 07/18/2015   TROPONINI 0.03 02/26/2015   Lab Results  Component Value Date   CHOL 167 07/19/2015   CHOL 148 09/22/2014   CHOL 193 05/16/2008   Lab Results  Component Value Date   HDL 19* 07/19/2015   HDL 23* 09/22/2014   HDL 30.7* 05/16/2008   Lab Results  Component Value Date   LDLCALC 105* 07/19/2015   LDLCALC 60 09/22/2014   LDLCALC 143* 05/16/2008   Lab Results  Component Value Date   TRIG 215* 07/19/2015   TRIG 324* 09/22/2014   TRIG 96 05/16/2008   Lab Results  Component Value Date   CHOLHDL 8.8 07/19/2015   CHOLHDL 6.4 09/22/2014   CHOLHDL 6.3 CALC 05/16/2008   No results found for: LDLDIRECT  Lab Results  Component Value Date   PROBNP 2888.0* 11/16/2013   PROBNP 1310.0* 12/11/2011   Lab Results  Component Value Date   TSH 1.530 11/17/2013   Lab Results  Component Value Date   HGBA1C 7.2* 07/18/2015    Radiology: Dg Chest 2 View  09/23/2015  CLINICAL DATA:  Right-sided chest pain and dizziness. EXAM: CHEST  2 VIEW COMPARISON:  09/03/2015 FINDINGS: The cardiac silhouette, mediastinal and hilar contours are within normal limits and stable. The lungs are clear. No pleural effusion. The bony thorax is intact. IMPRESSION: No acute cardiopulmonary findings. Electronically Signed   By: Marijo Sanes M.D.   On: 09/23/2015 13:39    EKG: today NSR rate 90. Normal.   ASSESSMENT AND PLAN:  1. Atypical chest pain. Patient thinks this is more related to acid reflux and I tend to agree. His pain does not sound cardiac and may also have a  musculoskeletal component. Recent Myoview in April is low risk. I would not recommend invasive evaluation with his severe renal disease. For now would continue to use Protonix, ranitidine, and prn TUMS. Avoid aluminum hydroxide antacids with CKD. Continue ASA, Coreg, nitrates and statin. Proceed with plans for EGD next month. If he should develop progressive ischemic type pain or have objective evidence of ischemia would need to consider invasive evaluation but this would carry a significant risk for contrast induced nephropathy and need for dialysis. 2. Morbid obesity with OSA. On CPAP 3. HTN controlled- continue home meds.  4. CKD stage 4. Creatinine increased today to 5.36. No clear reason. Will need follow up with Dr. Lorrene Reid.  5. Hyperlipidemia on statin.  Patient is stable for DC from the ED from a cardiac standpoint. Keep follow up appointments as scheduled.   Signed: Vauda Salvucci [place], [place]  09/23/2015, 5:34 PM

## 2015-09-23 NOTE — ED Provider Notes (Signed)
CSN: [id number]     Arrival date & time 09/23/15  1302 History   None    Chief Complaint  Patient presents with  . Chest Pain  . Shortness of Breath  . Blurred Vision     (Consider location/radiation/quality/duration/timing/severity/associated sxs/prior Treatment) Patient is a 55 y.o. male presenting with chest pain and shortness of breath.  Chest Pain Pain location:  R chest Pain quality: aching   Pain radiates to:  Mid back Pain severity:  Mild Onset quality:  Gradual Duration:  2 weeks Timing:  Intermittent Progression:  Worsening Chronicity:  New Associated symptoms: shortness of breath   Associated symptoms: no abdominal pain, no back pain, no cough, no dizziness, no fever, no nausea, no palpitations and not vomiting   Shortness of Breath Associated symptoms: chest pain   Associated symptoms: no abdominal pain, no cough, no fever, no neck pain, no rash, no sore throat and no vomiting     Past Medical History  Diagnosis Date  . Hypertension   . Gout   . 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 Cardiomyopathy ([place])     a. 10/2013: EF reduced to 35-40% b. 09/2014: EF improved to 55-60%, Grade 1 DD noted.  . CKD (chronic kidney disease), stage IV ([place])   . OSA (obstructive sleep apnea)     a. on CPAP  . Anemia 06/2015  . Chest pain 06/2015  . Arthritis    Past Surgical History  Procedure Laterality Date  . Cholecystectomy    . Umbilical hernia repair     Family History  Problem Relation Age of Onset  . Stroke Mother   . Pneumonia Father     died of Pneumonia x3  . CAD Neg Hx   . Kidney disease Maternal Grandmother   . Hypertension Maternal Grandmother   . Healthy Maternal Grandfather   . Healthy Paternal Grandmother   . Healthy Paternal Grandfather    Social History  Substance Use Topics  . Smoking status: Current Every Day Smoker -- 0.50 packs/day for 30 years    Types: Cigarettes  .  Smokeless tobacco: Never Used  . Alcohol Use: No    Review of Systems  Constitutional: Negative for fever and chills.  HENT: Negative for congestion and sore throat.   Eyes: Negative for pain.  Respiratory: Positive for shortness of breath. Negative for cough.   Cardiovascular: Positive for chest pain. Negative for palpitations and leg swelling.  Gastrointestinal: Negative for nausea, vomiting, abdominal pain and diarrhea.  Endocrine: Negative.   Genitourinary: Negative for flank pain.  Musculoskeletal: Negative for back pain and neck pain.  Skin: Negative for rash.  Allergic/Immunologic: Negative.   Neurological: Negative for dizziness, syncope and light-headedness.  Psychiatric/Behavioral: Negative for confusion.    Allergies  Review of patient's allergies indicates no known allergies.  Home Medications   Prior to Admission medications   Medication Sig Start Date End Date Taking? Authorizing Provider  allopurinol (ZYLOPRIM) 300 MG tablet Take 1 tablet (300 mg total) by mouth daily. 07/11/15   [place] Circle, FNP  aspirin 325 MG tablet Take 1 tablet (325 mg total) by mouth daily. 09/23/14   Ripudeep Krystal Eaton, MD  atorvastatin (LIPITOR) 40 MG tablet Take 1 tablet (40 mg total) by mouth daily at 6 PM. 07/20/15   Theodis Blaze, MD  calcitRIOL (ROCALTROL) 0.25 MCG capsule Take 0.25 mcg by mouth every Monday, Wednesday, and  Friday. 09/20/15   Historical Provider, MD  carvedilol (COREG) 25 MG tablet Take 1 tablet (25 mg total) by mouth 2 (two) times daily. 09/18/15   Minus Breeding, MD  fenofibrate 54 MG tablet Take 1 tablet (54 mg total) by mouth daily. 06/24/15   Minus Breeding, MD  furosemide (LASIX) 80 MG tablet Take 1 tablet (80 mg total) by mouth 2 (two) times daily. 02/27/15   Reyne Dumas, MD  hydrALAZINE (APRESOLINE) 25 MG tablet Take 1.5 tablets (37.5 mg total) by mouth 3 (three) times daily. 06/24/15   Minus Breeding, MD  isosorbide mononitrate (IMDUR) 30 MG 24 hr tablet Take 1 tablet  (30 mg total) by mouth daily. 06/24/15   Minus Breeding, MD  Na Sulfate-K Sulfate-Mg Sulf 17.5-3.13-1.6 GM/180ML SOLN Take 1 kit by mouth once. 09/17/15 10/17/15  Amy S Esterwood, PA-C  potassium chloride SA (K-DUR,KLOR-CON) 20 MEQ tablet Take 20 mEq by mouth 2 (two) times daily.    Historical Provider, MD  ranitidine (ZANTAC) 150 MG tablet Take 1 tablet (150 mg total) by mouth 2 (two) times daily. 09/17/15   Amy S Esterwood, PA-C  Vitamin D, Ergocalciferol, (DRISDOL) 50000 units CAPS capsule Take 2 capsules by mouth once a week. 06/12/15   Historical Provider, MD   BP 116/72 mmHg  Pulse 89  Temp(Src) 98.4 F (36.9 C) (Oral)  Resp 17  Ht 6' 2"  (1.88 m)  Wt 146.115 kg  BMI 41.34 kg/m2  SpO2 97% Physical Exam  Constitutional: He is oriented to person, place, and time. He appears well-developed and well-nourished.  HENT:  Head: Normocephalic and atraumatic.  Eyes: Conjunctivae and EOM are normal. Pupils are equal, round, and reactive to light.  Neck: Normal range of motion. Neck supple.  Cardiovascular: Normal rate, regular rhythm, normal heart sounds and intact distal pulses.   Pulmonary/Chest: Effort normal and breath sounds normal. No respiratory distress.  Abdominal: Soft. Bowel sounds are normal. There is no tenderness.  Musculoskeletal: Normal range of motion. He exhibits edema (bilateral non-pitting).  Neurological: He is alert and oriented to person, place, and time. He has normal strength and normal reflexes. No cranial nerve deficit or sensory deficit.  Normal finger to nose bilaterally.   No pronator drift bilaterally.   Skin: Skin is warm and dry.    ED Course  Procedures (including critical care time) Labs Review Labs Reviewed  BASIC METABOLIC PANEL - Abnormal; Notable for the following:    Glucose, Bld 116 (*)    BUN 38 (*)    Creatinine, Ser 5.36 (*)    GFR calc non Af Amer 11 (*)    GFR calc Af Amer 13 (*)    All other components within normal limits  CBC - Abnormal;  Notable for the following:    RBC 4.15 (*)    Hemoglobin 11.1 (*)    HCT 35.5 (*)    RDW 16.2 (*)    All other components within normal limits  I-STAT TROPOININ, ED    Imaging Review Dg Chest 2 View  09/23/2015  CLINICAL DATA:  Right-sided chest pain and dizziness. EXAM: CHEST  2 VIEW COMPARISON:  09/03/2015 FINDINGS: The cardiac silhouette, mediastinal and hilar contours are within normal limits and stable. The lungs are clear. No pleural effusion. The bony thorax is intact. IMPRESSION: No acute cardiopulmonary findings. Electronically Signed   By: Marijo Sanes M.D.   On: 09/23/2015 13:39   I have personally reviewed and evaluated these images and lab results as part of my  medical decision-making.   EKG Interpretation   Date/Time:  Monday September 23 2015 13:07:55 EDT Ventricular Rate:  90 PR Interval:  172 QRS Duration: 84 QT Interval:  388 QTC Calculation: 474 R Axis:   40 Text Interpretation:  Normal sinus rhythm Normal ECG No significant change  since last tracing Confirmed by KNAPP  MD-J, JON ([id number]) on 09/23/2015  3:38:23 PM      MDM   Final diagnoses:  None   The pt is a 55 yo male presenting to the ED for intermittent R CP associated with light headedness and mild SOB.  Reports having this previously but reports dizziness with these episodes has been increasing infrequency.   On exam pt is HDS in NAD.  Non-pitting edema bilaterally without further clinical signs of fluid overload.  EKG with no ischemic changes or arrhythmogenic potential and consistent with previous. Trop negative.  Cardiology consulted who evaluated the pt in the ED and feel not cardiac in nature.  Pt Wells low risk and do not feel dimer warranted at this time.  Normal neuro exam and low suspicion of central etiology of intermittent dizziness.  Cr found to be elevated today.  Discussed with nephrologist Dr. Lorrene Reid and feel pt is appropriate for f/u in two days for repeat lab testing.  Recommended lasix 120  mg BID and rx given.  Discussed this with the pt and wife at bedside and agree to f/u and feel comfortable for discharge.  Labs and were viewed by myself  incorporated into medical decision making.  Discussed pertinent finding with patient or caregiver prior to discharge with no further questions.  Immediate return precautions given and understood.  Medical decision making supervised by my attending Dr. Junius Roads, MD PGY-3 Emergency Medicine       Geronimo Boot, MD 09/23/15 [place], MD 09/24/15 [phone]

## 2015-09-23 NOTE — ED Provider Notes (Signed)
Pt complains of aching pain in the chest.    Lasts a few minutes at a time.  Started a few days ago.  Slight cough.  No sore throat or fever.  Pt has chronic shortness of breath but it got worse when the pain started.  No hx of heart disease.  Has history of CHF.  No hx of heart disease.  Sx similar in the past.  Negative stress test one month ago.  Physical Exam  BP 121/75 mmHg  Pulse 76  Temp(Src) 98.4 F (36.9 C) (Oral)  Resp 12  Ht 6\' 2"  (1.88 m)  Wt 146.115 kg  BMI 41.34 kg/m2  SpO2 100%  Physical Exam  Constitutional: He appears well-developed and well-nourished. No distress.  HENT:  Head: Normocephalic and atraumatic.  Right Ear: External ear normal.  Left Ear: External ear normal.  Eyes: Conjunctivae are normal. Right eye exhibits no discharge. Left eye exhibits no discharge. No scleral icterus.  Neck: Neck supple. No tracheal deviation present.  Cardiovascular: Normal rate.   Pulmonary/Chest: Effort normal. No stridor. No respiratory distress.  Musculoskeletal: He exhibits no edema.  Neurological: He is alert. Cranial nerve deficit: no gross deficits.  Skin: Skin is warm and dry. No rash noted.  Psychiatric: He has a normal mood and affect.  Nursing note and vitals reviewed.   ED Course  Procedures  EKG Interpretation  Date/Time:  Monday September 23 2015 13:07:55 EDT Ventricular Rate:  90 PR Interval:  172 QRS Duration: 84 QT Interval:  388 QTC Calculation: 474 R Axis:   40 Text Interpretation:  Normal sinus rhythm Normal ECG No significant change since last tracing Confirmed by Moxie Kalil  MD-J, Bradley Bostelman [id number]) on 09/23/2015 3:38:23 PM      MDM  Pt has history of recent cardiac eval.  Cardiology consulted in the ED.  Assessed by Dr [place].   Pt is ok for outpatient follow up.  Pt will follow up with nephrology considering his increased creatinine.      Dorie Rank, MD 09/24/15 [phone]

## 2015-09-23 NOTE — ED Notes (Signed)
Pt. Stated, I started having chest pain yesterday with SOB since yesterday and it just seems worse.

## 2015-10-10 ENCOUNTER — Emergency Department (HOSPITAL_COMMUNITY)
Admission: EM | Admit: 2015-10-10 | Discharge: 2015-10-11 | Disposition: A | Discharge status: Home / Self Care | Payer: BLUE CROSS/BLUE SHIELD | Attending: Emergency Medicine | Admitting: Emergency Medicine

## 2015-10-10 ENCOUNTER — Emergency Department (HOSPITAL_COMMUNITY): Payer: BLUE CROSS/BLUE SHIELD

## 2015-10-10 ENCOUNTER — Encounter (HOSPITAL_COMMUNITY): Payer: Self-pay | Admitting: Emergency Medicine

## 2015-10-10 DIAGNOSIS — F17210 Nicotine dependence, cigarettes, uncomplicated: Secondary | ICD-10-CM | POA: Diagnosis not present

## 2015-10-10 DIAGNOSIS — I509 Heart failure, unspecified: Secondary | ICD-10-CM | POA: Insufficient documentation

## 2015-10-10 DIAGNOSIS — I130 Hypertensive heart and chronic kidney disease with heart failure and stage 1 through stage 4 chronic kidney disease, or unspecified chronic kidney disease: Secondary | ICD-10-CM | POA: Insufficient documentation

## 2015-10-10 DIAGNOSIS — Z7982 Long term (current) use of aspirin: Secondary | ICD-10-CM | POA: Insufficient documentation

## 2015-10-10 DIAGNOSIS — N184 Chronic kidney disease, stage 4 (severe): Secondary | ICD-10-CM | POA: Insufficient documentation

## 2015-10-10 DIAGNOSIS — N189 Chronic kidney disease, unspecified: Secondary | ICD-10-CM

## 2015-10-10 DIAGNOSIS — R079 Chest pain, unspecified: Secondary | ICD-10-CM | POA: Diagnosis not present

## 2015-10-10 DIAGNOSIS — R0789 Other chest pain: Secondary | ICD-10-CM

## 2015-10-10 DIAGNOSIS — R42 Dizziness and giddiness: Secondary | ICD-10-CM

## 2015-10-10 DIAGNOSIS — R0602 Shortness of breath: Secondary | ICD-10-CM

## 2015-10-10 LAB — CBC
HCT: 36.8 % — ABNORMAL LOW (ref 39.0–52.0)
Hemoglobin: 11.3 g/dL — ABNORMAL LOW (ref 13.0–17.0)
MCH: 26.3 pg (ref 26.0–34.0)
MCHC: 30.7 g/dL (ref 30.0–36.0)
MCV: 85.6 fL (ref 78.0–100.0)
Platelets: 256 10*3/uL (ref 150–400)
RBC: 4.3 MIL/uL (ref 4.22–5.81)
RDW: 16 % — ABNORMAL HIGH (ref 11.5–15.5)
WBC: 6.4 10*3/uL (ref 4.0–10.5)

## 2015-10-10 LAB — BASIC METABOLIC PANEL
Anion gap: 8 (ref 5–15)
BUN: 54 mg/dL — ABNORMAL HIGH (ref 6–20)
CO2: 24 mmol/L (ref 22–32)
Calcium: 10 mg/dL (ref 8.9–10.3)
Chloride: 105 mmol/L (ref 101–111)
Creatinine, Ser: 6.64 mg/dL — ABNORMAL HIGH (ref 0.61–1.24)
GFR calc Af Amer: 10 mL/min — ABNORMAL LOW (ref >60)
GFR calc non Af Amer: 8 mL/min — ABNORMAL LOW (ref >60)
Glucose, Bld: 109 mg/dL — ABNORMAL HIGH (ref 65–99)
Potassium: 4.6 mmol/L (ref 3.5–5.1)
Sodium: 137 mmol/L (ref 135–145)

## 2015-10-10 LAB — I-STAT TROPONIN, ED
Troponin i, poc: 0.01 ng/mL (ref 0.00–0.08)
Troponin i, poc: 0.02 ng/mL (ref 0.00–0.08)

## 2015-10-10 NOTE — ED Provider Notes (Signed)
CSN: [id number]     Arrival date & time 10/10/15  1618 History   First MD Initiated Contact with Patient 10/10/15 2133     Chief Complaint  Patient presents with  . Chest Pain  . Dizziness     55 year old male presents with chest pain and dizziness. PMH significant for HTN, GERD, Gout, CHF, CKD (stage 4), OSA, anemia. He states he has been feeling lightheaded for several weeks on and off. He was seen in the ED on 7/3 for the same. Today he was driving and had a sudden onset of lightheadedness. He pulled his vehicle over to the side of the road and the feeling passed. He started driving again and started to feel light headed again and this time started to have chest pain therefore he decided to come to the ED for further evaluation. He was seen in the ED on 7/3 for chest pain and it was felt that it was atypical and most likely due to GI etiology. He states he has been compliant with his antacid therapy as well as lasix. The chest pain is on the left side of his chest, is intermittent, and feels similar to chest pain he's had in the past. It is associated with SOB. When he was evaluated on 7/3 his SCr was elevated and he was advised to follow up with his nephrologist - Dr. Jamal Maes. He states he was seen by her a week ago and had his SCr rechecked and it was 3.8. He denies fever, chills, syncope, weakness, palpitations, cough, wheezing, abdominal pain, N/V/D.    Patient is a 55 y.o. male presenting with chest pain and dizziness. The history is provided by the patient.  Chest Pain Associated symptoms: dizziness and shortness of breath   Associated symptoms: no abdominal pain, no cough, no fever, no nausea, no palpitations, not vomiting and no weakness   Dizziness Associated symptoms: chest pain and shortness of breath   Associated symptoms: no diarrhea, no nausea, no palpitations, no vomiting and no weakness     Past Medical History  Diagnosis Date  . Hypertension   . Gout   . 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 Cardiomyopathy ([place])     a. 10/2013: EF reduced to 35-40% b. 09/2014: EF improved to 55-60%, Grade 1 DD noted.  . CKD (chronic kidney disease), stage IV ([place])   . OSA (obstructive sleep apnea)     a. on CPAP  . Anemia 06/2015  . Chest pain 06/2015  . Arthritis    Past Surgical History  Procedure Laterality Date  . Cholecystectomy    . Umbilical hernia repair     Family History  Problem Relation Age of Onset  . Stroke Mother   . Pneumonia Father     died of Pneumonia x3  . CAD Neg Hx   . Kidney disease Maternal Grandmother   . Hypertension Maternal Grandmother   . Healthy Maternal Grandfather   . Healthy Paternal Grandmother   . Healthy Paternal Grandfather    Social History  Substance Use Topics  . Smoking status: Current Every Day Smoker -- 0.50 packs/day for 30 years    Types: Cigarettes  . Smokeless tobacco: Never Used  . Alcohol Use: No    Review of Systems  Constitutional: Negative for fever.  Respiratory: Positive for shortness of breath. Negative for cough and wheezing.   Cardiovascular: Positive for  chest pain. Negative for palpitations.  Gastrointestinal: Negative for abdominal pain, diarrhea, nausea and vomiting.  Neurological: Positive for dizziness and light-headedness. Negative for syncope and weakness.  All other systems reviewed and are negative.     Allergies  Review of patient's allergies indicates no known allergies.  Home Medications   Prior to Admission medications   Medication Sig Start Date End Date Taking? Authorizing Provider  acetaminophen (TYLENOL) 500 MG tablet Take 1,000 mg by mouth every 6 (six) hours as needed (pain).   Yes Historical Provider, MD  allopurinol (ZYLOPRIM) 300 MG tablet Take 1 tablet (300 mg total) by mouth daily. 07/11/15  Yes Golden Circle, FNP  aspirin EC 81 MG tablet Take 81 mg by mouth daily.   Yes Historical  Provider, MD  atorvastatin (LIPITOR) 40 MG tablet Take 1 tablet (40 mg total) by mouth daily at 6 PM. 07/20/15  Yes Theodis Blaze, MD  calcitRIOL (ROCALTROL) 0.25 MCG capsule Take 0.25 mcg by mouth every Monday, Wednesday, and Friday. 09/20/15  Yes Historical Provider, MD  carvedilol (COREG) 25 MG tablet Take 1 tablet (25 mg total) by mouth 2 (two) times daily. 09/18/15  Yes Minus Breeding, MD  fenofibrate 54 MG tablet Take 1 tablet (54 mg total) by mouth daily. 06/24/15  Yes Minus Breeding, MD  furosemide (LASIX) 40 MG tablet Take 3 tablets (120 mg total) by mouth 2 (two) times daily. 09/23/15  Yes Geronimo Boot, MD  hydrALAZINE (APRESOLINE) 25 MG tablet Take 1.5 tablets (37.5 mg total) by mouth 3 (three) times daily. 06/24/15  Yes Minus Breeding, MD  isosorbide mononitrate (IMDUR) 30 MG 24 hr tablet Take 1 tablet (30 mg total) by mouth daily. 06/24/15  Yes Minus Breeding, MD  pantoprazole (PROTONIX) 40 MG tablet Take 40 mg by mouth daily.   Yes Historical Provider, MD  potassium chloride SA (K-DUR,KLOR-CON) 20 MEQ tablet Take 20 mEq by mouth 2 (two) times daily.   Yes Historical Provider, MD  ranitidine (ZANTAC) 150 MG tablet Take 1 tablet (150 mg total) by mouth 2 (two) times daily. 09/17/15  Yes Amy S Esterwood, PA-C  aspirin 325 MG tablet Take 1 tablet (325 mg total) by mouth daily. Patient not taking: Reported on 09/23/2015 09/23/14   Ripudeep Krystal Eaton, MD  furosemide (LASIX) 80 MG tablet Take 1 tablet (80 mg total) by mouth 2 (two) times daily. Patient taking differently: Take 120 mg by mouth 2 (two) times daily.  02/27/15   Reyne Dumas, MD  Na Sulfate-K Sulfate-Mg Sulf 17.5-3.13-1.6 GM/180ML SOLN Take 1 kit by mouth once. 09/17/15 10/17/15  Amy S Esterwood, PA-C   BP 127/63 mmHg  Pulse 82  Temp(Src) 97.5 F (36.4 C) (Oral)  Resp 17  SpO2 99%   Physical Exam  Constitutional: He is oriented to person, place, and time. He appears well-developed and well-nourished. No distress.  Obese male in NAD  HENT:   Head: Normocephalic and atraumatic.  Eyes: Conjunctivae are normal. Pupils are equal, round, and reactive to light. Right eye exhibits no discharge. Left eye exhibits no discharge. No scleral icterus.  Neck: Normal range of motion.  Cardiovascular: Normal rate and regular rhythm.  Exam reveals no gallop and no friction rub.   No murmur heard. Pulmonary/Chest: Effort normal and breath sounds normal. No respiratory distress. He has no wheezes. He has no rales. He exhibits no tenderness.  Abdominal: Soft. He exhibits no distension and no mass. There is no tenderness. There is no rebound and no guarding.  Musculoskeletal:  Right leg is more edematous than left. No calf tenderness  Neurological: He is alert and oriented to person, place, and time. No cranial nerve deficit.  No dizziness elicited with turning of head  Skin: Skin is warm and dry.  Psychiatric: He has a normal mood and affect.    ED Course  Procedures (including critical care time) Labs Review Labs Reviewed  BASIC METABOLIC PANEL - Abnormal; Notable for the following:       Result Value   Glucose, Bld 109 (*)    BUN 54 (*)    Creatinine, Ser 6.64 (*)    GFR calc non Af Amer 8 (*)    GFR calc Af Amer 10 (*)    All other components within normal limits  CBC - Abnormal; Notable for the following:    Hemoglobin 11.3 (*)    HCT 36.8 (*)    RDW 16.0 (*)    All other components within normal limits  I-STAT TROPOININ, ED  Randolm Idol, ED    Imaging Review Dg Chest 2 View  10/10/2015  CLINICAL DATA:  Chest pain and blurred vision today. EXAM: CHEST  2 VIEW COMPARISON:  PA and lateral chest 09/23/2015 09/13/2014. FINDINGS: The lungs are clear. Heart size is normal. No pneumothorax or pleural effusion. No focal bony abnormality. IMPRESSION: No acute disease. Electronically Signed   By: Inge Rise M.D.   On: 10/10/2015 17:27   I have personally reviewed and evaluated these images and lab results as part of my medical  decision-making.   EKG Interpretation  Date/Time:  Thursday October 10 2015 16:25:12 EDT Ventricular Rate:  89 PR Interval:  176 QRS Duration: 94 QT Interval:  376 QTC Calculation: 457 R Axis:   49 Text Interpretation:  Normal sinus rhythm Normal ECG No significant change since last tracing Confirmed by Christy Gentles  MD, Elenore Rota ([id number]) on 10/10/2015 11:15:59 PM       MDM   Final diagnoses:  CKD (chronic kidney disease), unspecified stage  Episodic lightheadedness  Atypical chest pain  Shortness of breath    55 year old male who presents with dizziness, chest pain, SOB. Unclear if this is dehydration vs vertigo as he states he is not more dizzy with positional changes and it comes and goes intermittently. He is reporting all his symptoms have subsided while in the ED today. Orthostatics are negative.  Chest pain work up is reassuring. EKG is NSR and shows no significant change since last. CXR is negative. Troponin is 0.02 and second troponin is 0.01. He had similar symptoms one month ago and it was determined his chest pain was most likely from a GI source.   CMP is remarkable for worsening CKD. Patient states his SCr was 3.8 when last checked 1 week ago. Today it is 6.64. Advised to follow up with his nephrologist for recheck. He also states he has an appt with his PCP tomorrow. Advised to drink plenty of fluids and rx meclizine for tx of possible vertigo. Patient is NAD, non-toxic, with stable VS. Patient is informed of clinical course, understands medical decision making process, and agrees with plan. Opportunity for questions provided and all questions answered. Return precautions given.    Recardo Evangelist, PA-C 10/13/15 1647  Malvin Johns, MD 10/14/15 1534

## 2015-10-10 NOTE — ED Notes (Signed)
Pt sts left sided CP and dizziness today

## 2015-10-11 ENCOUNTER — Ambulatory Visit (INDEPENDENT_AMBULATORY_CARE_PROVIDER_SITE_OTHER): Payer: BLUE CROSS/BLUE SHIELD | Admitting: Family

## 2015-10-11 ENCOUNTER — Encounter: Payer: Self-pay | Admitting: Family

## 2015-10-11 VITALS — BP 110/60 | HR 87 | Temp 98.2°F | Ht 74.0 in | Wt 313.0 lb

## 2015-10-11 DIAGNOSIS — R42 Dizziness and giddiness: Secondary | ICD-10-CM

## 2015-10-11 MED ORDER — MECLIZINE HCL 25 MG PO TABS: 25.0000 mg | ORAL_TABLET | Freq: Three times a day (TID) | ORAL | Status: DC | PRN

## 2015-10-11 NOTE — Assessment & Plan Note (Signed)
Symptoms consistent with possible dehydration given increased temperature in the local environment as well as increased dosage of Lasix. Most recent creatinine in the emergency department was elevated at 6.6 with recommended follow-up with nephrology. Change position slowly. Follow-up if symptoms worsen or fail to improve for further evaluation.

## 2015-10-11 NOTE — Progress Notes (Signed)
Subjective:    Patient ID: Craig Flynn, male    DOB: October 27, 1960, 55 y.o.   MRN: [id number]  Chief Complaint  Patient presents with  . Dizziness    Pt states that he has been having dizzy spells for about 2 weeks that are not persistent. Pt states that it mostly happens when he sits down.     HPI:  Craig Flynn is a 55 y.o. male who  has a past medical history of Hypertension; Gout; Other specified disorder of intestines; Cardiomyopathy ([place]); CKD (chronic kidney disease), stage IV ([place]); OSA (obstructive sleep apnea); Anemia (06/2015); Chest pain (06/2015); and Arthritis. and presents today for a follow up office visit.   This is a new problem. Associated symptoms of dizziness has been going on for about 2 weeks that generally waxes and wanes. Described as lightheadedness. Aggravated by sitting down and change of position. Indicates that he is drinking about 6-7 20 oz bottles of water with occasional soda. Was seen in the ED and diagnosed with possible vertigo and prescribed meclizine.    No Known Allergies   Current Outpatient Prescriptions on File Prior to Visit  Medication Sig Dispense Refill  . acetaminophen (TYLENOL) 500 MG tablet Take 1,000 mg by mouth every 6 (six) hours as needed (pain).    Marland Kitchen allopurinol (ZYLOPRIM) 300 MG tablet Take 1 tablet (300 mg total) by mouth daily. 30 tablet 3  . aspirin EC 81 MG tablet Take 81 mg by mouth daily.    Marland Kitchen atorvastatin (LIPITOR) 40 MG tablet Take 1 tablet (40 mg total) by mouth daily at 6 PM. 30 tablet 0  . calcitRIOL (ROCALTROL) 0.25 MCG capsule Take 0.25 mcg by mouth every Monday, Wednesday, and Friday.  0  . carvedilol (COREG) 25 MG tablet Take 1 tablet (25 mg total) by mouth 2 (two) times daily. 60 tablet 6  . fenofibrate 54 MG tablet Take 1 tablet (54 mg total) by mouth daily. 30 tablet 11  . furosemide (LASIX) 40 MG tablet Take 3 tablets (120 mg total) by mouth 2 (two) times daily. 30 tablet 0  . furosemide (LASIX) 80 MG tablet Take  1 tablet (80 mg total) by mouth 2 (two) times daily. (Patient taking differently: Take 120 mg by mouth 2 (two) times daily. ) 60 tablet 1  . hydrALAZINE (APRESOLINE) 25 MG tablet Take 1.5 tablets (37.5 mg total) by mouth 3 (three) times daily. 135 tablet 11  . isosorbide mononitrate (IMDUR) 30 MG 24 hr tablet Take 1 tablet (30 mg total) by mouth daily. 30 tablet 11  . Na Sulfate-K Sulfate-Mg Sulf 17.5-3.13-1.6 GM/180ML SOLN Take 1 kit by mouth once. 354 mL 0  . pantoprazole (PROTONIX) 40 MG tablet Take 40 mg by mouth daily.    . potassium chloride SA (K-DUR,KLOR-CON) 20 MEQ tablet Take 20 mEq by mouth 2 (two) times daily.    . ranitidine (ZANTAC) 150 MG tablet Take 1 tablet (150 mg total) by mouth 2 (two) times daily. 60 tablet 0  . meclizine (ANTIVERT) 25 MG tablet Take 1 tablet (25 mg total) by mouth 3 (three) times daily as needed for dizziness. (Patient not taking: Reported on 10/11/2015) 30 tablet 0   No current facility-administered medications on file prior to visit.     Past Surgical History  Procedure Laterality Date  . Cholecystectomy    . Umbilical hernia repair       Review of Systems  Constitutional: Negative for fever and chills.  Respiratory: Negative  for chest tightness and shortness of breath.   Cardiovascular: Negative for chest pain, palpitations and leg swelling.  Neurological: Positive for dizziness and light-headedness. Negative for syncope and weakness.      Objective:    BP 110/60 mmHg  Pulse 87  Temp(Src) 98.2 F (36.8 C) (Oral)  Ht 6' 2"  (1.88 m)  Wt 313 lb (141.976 kg)  BMI 40.17 kg/m2  SpO2 98% Nursing note and vital signs reviewed.  Physical Exam  Constitutional: He is oriented to person, place, and time. He appears well-developed and well-nourished. No distress.  HENT:  Right Ear: Hearing, tympanic membrane, external ear and ear canal normal.  Left Ear: Hearing and external ear normal.  Nose: Nose normal. Right sinus exhibits no maxillary sinus  tenderness and no frontal sinus tenderness. Left sinus exhibits no maxillary sinus tenderness and no frontal sinus tenderness.  Mouth/Throat: Uvula is midline, oropharynx is clear and moist and mucous membranes are normal.  Cardiovascular: Normal rate, regular rhythm, normal heart sounds and intact distal pulses.   Pulmonary/Chest: Effort normal and breath sounds normal.  Neurological: He is alert and oriented to person, place, and time.  Skin: Skin is warm and dry.  Psychiatric: He has a normal mood and affect. His behavior is normal. Judgment and thought content normal.       Assessment & Plan:   Problem List Items Addressed This Visit      Other   Dizziness - Primary    Symptoms consistent with possible dehydration given increased temperature in the local environment as well as increased dosage of Lasix. Most recent creatinine in the emergency department was elevated at 6.6 with recommended follow-up with nephrology. Change position slowly. Follow-up if symptoms worsen or fail to improve for further evaluation.          I am having Mr. Sheeler maintain his furosemide, isosorbide mononitrate, fenofibrate, hydrALAZINE, allopurinol, potassium chloride SA, atorvastatin, Na Sulfate-K Sulfate-Mg Sulf, ranitidine, carvedilol, calcitRIOL, pantoprazole, acetaminophen, furosemide, aspirin EC, and meclizine.   Follow-up: Return if symptoms worsen or fail to improve.  Mauricio Po, FNP

## 2015-10-11 NOTE — Patient Instructions (Addendum)
Thank you for choosing [facility].  Summary/Instructions:   Please continue to take your medication as prescribed.   Increase water intake  Follow up if symptoms do not improve.   Try meclizine as needed.   If your symptoms worsen or fail to improve, please contact our office for further instruction, or in case of emergency go directly to the emergency room at the closest medical facility.    Dizziness Dizziness is a common problem. It is a feeling of unsteadiness or light-headedness. You may feel like you are about to faint. Dizziness can lead to injury if you stumble or fall. Anyone can become dizzy, but dizziness is more common in older adults. This condition can be caused by a number of things, including medicines, dehydration, or illness. HOME CARE INSTRUCTIONS Taking these steps may help with your condition: Eating and Drinking  Drink enough fluid to keep your urine clear or pale yellow. This helps to keep you from becoming dehydrated. Try to drink more clear fluids, such as water.  Do not drink alcohol.  Limit your caffeine intake if directed by your health care provider.  Limit your salt intake if directed by your health care provider. Activity  Avoid making quick movements.  Rise slowly from chairs and steady yourself until you feel okay.  In the morning, first sit up on the side of the bed. When you feel okay, stand slowly while you hold onto something until you know that your balance is fine.  Move your legs often if you need to stand in one place for a long time. Tighten and relax your muscles in your legs while you are standing.  Do not drive or operate heavy machinery if you feel dizzy.  Avoid bending down if you feel dizzy. Place items in your home so that they are easy for you to reach without leaning over. Lifestyle  Do not use any tobacco products, including cigarettes, chewing tobacco, or electronic cigarettes. If you need help quitting, ask your  health care provider.  Try to reduce your stress level, such as with yoga or meditation. Talk with your health care provider if you need help. General Instructions  Watch your dizziness for any changes.  Take medicines only as directed by your health care provider. Talk with your health care provider if you think that your dizziness is caused by a medicine that you are taking.  Tell a friend or a family member that you are feeling dizzy. If he or she notices any changes in your behavior, have this person call your health care provider.  Keep all follow-up visits as directed by your health care provider. This is important. SEEK MEDICAL CARE IF:  Your dizziness does not go away.  Your dizziness or light-headedness gets worse.  You feel nauseous.  You have reduced hearing.  You have new symptoms.  You are unsteady on your feet or you feel like the room is spinning. SEEK IMMEDIATE MEDICAL CARE IF:  You vomit or have diarrhea and are unable to eat or drink anything.  You have problems talking, walking, swallowing, or using your arms, hands, or legs.  You feel generally weak.  You are not thinking clearly or you have trouble forming sentences. It may take a friend or family member to notice this.  You have chest pain, abdominal pain, shortness of breath, or sweating.  Your vision changes.  You notice any bleeding.  You have a headache.  You have neck pain or a stiff neck.  You have a fever.   This information is not intended to replace advice given to you by your health care provider. Make sure you discuss any questions you have with your health care provider.   Document Released: 09/02/2000 Document Revised: 07/24/2014 Document Reviewed: 03/05/2014 Elsevier Interactive Patient Education [employer].

## 2015-10-11 NOTE — Progress Notes (Signed)
Pre visit review using our clinic review tool, if applicable. No additional management support is needed unless otherwise documented below in the visit note. 

## 2015-10-21 DIAGNOSIS — I129 Hypertensive chronic kidney disease with stage 1 through stage 4 chronic kidney disease, or unspecified chronic kidney disease: Secondary | ICD-10-CM | POA: Diagnosis not present

## 2015-10-22 ENCOUNTER — Encounter: Payer: Self-pay | Admitting: Gastroenterology

## 2015-10-22 ENCOUNTER — Ambulatory Visit (AMBULATORY_SURGERY_CENTER): Payer: BLUE CROSS/BLUE SHIELD | Admitting: Gastroenterology

## 2015-10-22 VITALS — BP 120/68 | HR 78 | Temp 99.3°F | Resp 20 | Ht 74.0 in | Wt 326.0 lb

## 2015-10-22 DIAGNOSIS — K229 Disease of esophagus, unspecified: Secondary | ICD-10-CM | POA: Diagnosis not present

## 2015-10-22 DIAGNOSIS — K208 Other esophagitis: Secondary | ICD-10-CM | POA: Diagnosis not present

## 2015-10-22 DIAGNOSIS — Z860101 Personal history of adenomatous and serrated colon polyps: Secondary | ICD-10-CM

## 2015-10-22 DIAGNOSIS — K219 Gastro-esophageal reflux disease without esophagitis: Secondary | ICD-10-CM

## 2015-10-22 DIAGNOSIS — Z86010 Personal history of colonic polyps: Secondary | ICD-10-CM

## 2015-10-22 DIAGNOSIS — D124 Benign neoplasm of descending colon: Secondary | ICD-10-CM

## 2015-10-22 DIAGNOSIS — Z1211 Encounter for screening for malignant neoplasm of colon: Secondary | ICD-10-CM | POA: Diagnosis not present

## 2015-10-22 DIAGNOSIS — D126 Benign neoplasm of colon, unspecified: Secondary | ICD-10-CM

## 2015-10-22 HISTORY — DX: Benign neoplasm of colon, unspecified: D12.6

## 2015-10-22 MED ORDER — OMEPRAZOLE 40 MG PO CPDR: 40.0000 mg | DELAYED_RELEASE_CAPSULE | Freq: Every day | ORAL | 12 refills | Status: DC

## 2015-10-22 MED ORDER — SODIUM CHLORIDE 0.9 % IV SOLN
500.0000 mL | INTRAVENOUS | Status: DC
Start: 2015-10-22 — End: 2016-02-20

## 2015-10-22 NOTE — Op Note (Signed)
[facility] Patient Name: Craig Flynn Procedure Date: 10/22/2015 2:14 PM MRN: [id number] Endoscopist: Ladene Artist , MD Age: 55 Referring MD:  Date of Birth: 1961/02/12 Gender: Male Account #: [id number] Procedure:                Upper GI endoscopy Indications:              Esophageal reflux symptoms that persist despite                            appropriate therapy Medicines:                Monitored Anesthesia Care Procedure:                Pre-Anesthesia Assessment:                           - Prior to the procedure, a History and Physical                            was performed, and patient medications and                            allergies were reviewed. The patient's tolerance of                            previous anesthesia was also reviewed. The risks                            and benefits of the procedure and the sedation                            options and risks were discussed with the patient.                            All questions were answered, and informed consent                            was obtained. Prior Anticoagulants: The patient has                            taken no previous anticoagulant or antiplatelet                            agents. ASA Grade Assessment: III - A patient with                            severe systemic disease. After reviewing the risks                            and benefits, the patient was deemed in                            satisfactory condition to undergo the procedure.  After obtaining informed consent, the endoscope was                            passed under direct vision. Throughout the                            procedure, the patient's blood pressure, pulse, and                            oxygen saturations were monitored continuously. The                            Model GIF-HQ190 [id number]) scope was introduced                            through the mouth, and advanced to  the second part                            of duodenum. The upper GI endoscopy was                            accomplished without difficulty. The patient                            tolerated the procedure well. Scope In: Scope Out: Findings:                 The Z-line was variable and was found at the                            gastroesophageal junction. Biopsies were taken with                            a cold forceps for histology.                           The exam of the esophagus was otherwise normal.                           A small hiatal hernia was present.                           The exam of the stomach was otherwise normal.                           The duodenal bulb and second portion of the                            duodenum were normal. Complications:            No immediate complications. Estimated Blood Loss:     Estimated blood loss was minimal. Impression:               - Z-line variable, at the gastroesophageal  junction. Biopsied.                           - Small hiatal hernia.                           - Otherwise normal EGD. Recommendation:           - Resume previous diet.                           - Prilosec (omeprazole) 40 mg PO daily.                           - Discontinue ranitidine                           - Continue present medications.                           - Await pathology results. Ladene Artist, MD 10/22/2015 2:51:20 PM This report has been signed electronically.

## 2015-10-22 NOTE — Progress Notes (Signed)
A/ox3 pleased with MAC, report to /Burnette RN

## 2015-10-22 NOTE — Op Note (Signed)
[facility] Patient Name: Craig Flynn Procedure Date: 10/22/2015 2:15 PM MRN: [id number] Endoscopist: Ladene Artist , MD Age: 55 Referring MD:  Date of Birth: 09-Jun-1960 Gender: Male Account #: [id number] Procedure:                Colonoscopy Indications:              Surveillance: Personal history of adenomatous                            polyps on last colonoscopy > 5 years ago Medicines:                Monitored Anesthesia Care Procedure:                Pre-Anesthesia Assessment:                           - Prior to the procedure, a History and Physical                            was performed, and patient medications and                            allergies were reviewed. The patient's tolerance of                            previous anesthesia was also reviewed. The risks                            and benefits of the procedure and the sedation                            options and risks were discussed with the patient.                            All questions were answered, and informed consent                            was obtained. Prior Anticoagulants: The patient has                            taken no previous anticoagulant or antiplatelet                            agents. ASA Grade Assessment: III - A patient with                            severe systemic disease. After reviewing the risks                            and benefits, the patient was deemed in                            satisfactory condition to undergo the procedure.  After obtaining informed consent, the colonoscope                            was passed under direct vision. Throughout the                            procedure, the patient's blood pressure, pulse, and                            oxygen saturations were monitored continuously. The                            Model CF-HQ190L [id number]) scope was introduced                            through the anus and  advanced to the the cecum,                            identified by appendiceal orifice and ileocecal                            valve. The ileocecal valve, appendiceal orifice,                            and rectum were photographed. The quality of the                            bowel preparation was good. The colonoscopy was                            performed without difficulty. The patient tolerated                            the procedure well. Scope In: 2:19:54 PM Scope Out: 2:30:36 PM Scope Withdrawal Time: 0 hours 8 minutes 40 seconds  Total Procedure Duration: 0 hours 10 minutes 42 seconds  Findings:                 A 6 mm polyp was found in the descending colon. The                            polyp was sessile. The polyp was removed with a                            cold snare. Resection and retrieval were complete.                           The exam was otherwise without abnormality on                            direct and retroflexion views. Complications:            No immediate complications. Estimated blood loss:  None. Estimated Blood Loss:     Estimated blood loss: none. Impression:               - One 6 mm polyp in the descending colon, removed                            with a cold snare. Resected and retrieved.                           - The examination was otherwise normal on direct                            and retroflexion views. Recommendation:           - Repeat colonoscopy in 5 years for surveillance.                           - Patient has a contact number available for                            emergencies. The signs and symptoms of potential                            delayed complications were discussed with the                            patient. Return to normal activities tomorrow.                            Written discharge instructions were provided to the                            patient.                           -  Resume previous diet.                           - Continue present medications.                           - Await pathology results. Ladene Artist, MD 10/22/2015 2:46:13 PM This report has been signed electronically.

## 2015-10-22 NOTE — Patient Instructions (Signed)

## 2015-10-22 NOTE — Progress Notes (Signed)
Called to room to assist during endoscopic procedure.  Patient ID and intended procedure confirmed with present staff. Received instructions for my participation in the procedure from the performing physician.  

## 2015-10-23 ENCOUNTER — Telehealth: Payer: Self-pay

## 2015-10-23 NOTE — Telephone Encounter (Signed)
  Follow up Call-  Call back number 10/22/2015  Post procedure Call Back phone  # #[phone] cell  Permission to leave phone message Yes  Some recent data might be hidden     Patient questions:  Do you have a fever, pain , or abdominal swelling? No. Pain Score  0 *  Have you tolerated food without any problems? Yes.    Have you been able to return to your normal activities? Yes.    Do you have any questions about your discharge instructions: Diet   No. Medications  No. Follow up visit  No.  Do you have questions or concerns about your Care? No.  Actions: * If pain score is 4 or above: No action needed, pain <4.

## 2015-11-04 ENCOUNTER — Encounter: Payer: Self-pay | Admitting: Gastroenterology

## 2015-11-08 ENCOUNTER — Other Ambulatory Visit: Payer: Self-pay | Admitting: Family

## 2015-11-11 ENCOUNTER — Telehealth: Payer: Self-pay | Admitting: Cardiology

## 2015-11-11 NOTE — Telephone Encounter (Signed)
New message      Pt needs a refill.     *STAT* If patient is at the pharmacy, call can be transferred to refill team.   1. Which medications need to be refilled? (please list name of each medication and dose if known) phenobrite 30mg   2. Which pharmacy/location (including street and city if local pharmacy) is medication to be sent to? [facility] on [address]  3. Do they need a 30 day or 90 day supply? [place]

## 2015-11-11 NOTE — Telephone Encounter (Signed)
Called patient to verify Fenofibrate strength and Pharmacy. Rx was send for #0 with 11 RF in April.  I verified with [facility] rx is waiting on patient to refill.

## 2015-11-12 ENCOUNTER — Telehealth: Payer: Self-pay | Admitting: Family

## 2015-11-12 ENCOUNTER — Telehealth: Payer: Self-pay | Admitting: Internal Medicine

## 2015-11-12 DIAGNOSIS — R0789 Other chest pain: Secondary | ICD-10-CM

## 2015-11-12 NOTE — Telephone Encounter (Signed)
Recently started omeprazole 40 mg daily for GERD per Dr. Dory Peru  Still having to take frequent antacids despited this - regurgitating it sounds like.  I told him it may take some more time to feel better on the PPI but would forward to Dr. [name] for f/u and any changes.

## 2015-11-12 NOTE — Telephone Encounter (Signed)
Pt was wondering what he should do from pain/discomfort that he might have around chest area (comes and goes). Pt stating he got colonoscopy done and everything if normal. Please call him back

## 2015-11-13 NOTE — Telephone Encounter (Signed)
Please have pt increase omeprazole to 40 mg po bid for 1 month then resume 40 mg po qam Reinforce all antireflux measures to help to decrease regurgitation.

## 2015-11-13 NOTE — Telephone Encounter (Signed)
Patient notified I have mailed him an antireflux diet and measures

## 2015-11-14 NOTE — Telephone Encounter (Signed)
With no cardiac origin the next step would be to be evaluated by orthopedics.

## 2015-11-15 NOTE — Telephone Encounter (Signed)
Pt was wondering if Marya Amsler can refer him for orthopedics. Please call him back

## 2015-11-18 NOTE — Telephone Encounter (Signed)
Referal to ortho placed for chest pain.

## 2015-11-18 NOTE — Addendum Note (Signed)
Addended by: Mauricio Po D on: 11/18/2015 12:54 PM   Modules accepted: Orders

## 2015-12-04 ENCOUNTER — Telehealth: Payer: Self-pay | Admitting: Gastroenterology

## 2015-12-04 DIAGNOSIS — K219 Gastro-esophageal reflux disease without esophagitis: Secondary | ICD-10-CM

## 2015-12-04 MED ORDER — OMEPRAZOLE 40 MG PO CPDR: DELAYED_RELEASE_CAPSULE | ORAL | 0 refills | Status: DC

## 2015-12-04 NOTE — Telephone Encounter (Signed)
Called patient to inform him that I sent his prescription of omeprazole 40 mg twice daily to his pharmacy to take for one month then resume to once daily dosing. Patient verbalized understanding.

## 2015-12-10 ENCOUNTER — Other Ambulatory Visit: Payer: Self-pay | Admitting: Family

## 2015-12-10 DIAGNOSIS — K219 Gastro-esophageal reflux disease without esophagitis: Secondary | ICD-10-CM

## 2015-12-31 ENCOUNTER — Encounter: Payer: Self-pay | Admitting: Family

## 2015-12-31 ENCOUNTER — Ambulatory Visit (INDEPENDENT_AMBULATORY_CARE_PROVIDER_SITE_OTHER): Payer: BLUE CROSS/BLUE SHIELD | Admitting: Family

## 2015-12-31 VITALS — BP 122/68 | HR 77 | Temp 98.3°F | Resp 16 | Ht 74.0 in | Wt 313.0 lb

## 2015-12-31 DIAGNOSIS — R079 Chest pain, unspecified: Secondary | ICD-10-CM | POA: Diagnosis not present

## 2015-12-31 DIAGNOSIS — R42 Dizziness and giddiness: Secondary | ICD-10-CM | POA: Diagnosis not present

## 2015-12-31 NOTE — Patient Instructions (Addendum)
Thank you for choosing [facility].  SUMMARY AND INSTRUCTIONS:  Medication:  Please continue to take your medications as prescribed.   Your prescription(s) have been submitted to your pharmacy or been printed and provided for you. Please take as directed and contact our office if you believe you are having problem(s) with the medication(s) or have any questions.  Referrals:  They will call to schedule your appointment with neurology.   Referrals have been made during this visit. You should expect to hear back from our schedulers in about 7-10 days in regards to establishing an appointment with the specialists we discussed.   Follow up:  If your symptoms worsen or fail to improve, please contact our office for further instruction, or in case of emergency go directly to the emergency room at the closest medical facility.

## 2015-12-31 NOTE — Progress Notes (Signed)
Subjective:    Patient ID: Craig Flynn, male    DOB: April 03, 1960, 55 y.o.   MRN: [id number]  Chief Complaint  Patient presents with  . Dizziness    still having dizziness, lightheadedness, pressure in head    HPI:  Craig Flynn is a 55 y.o. male who  has a past medical history of Anemia (06/2015); Arthritis; Cardiomyopathy ([place]); Chest pain (06/2015); CHF (congestive heart failure) ([place]); CKD (chronic kidney disease), stage IV ([place]); GERD (gastroesophageal reflux disease); Gout; Hypertension; OSA (obstructive sleep apnea); Other specified disorder of intestines; Sleep apnea; and Tuberculosis. and presents today for a follow up.  Previously evaluated in the office with dizziness which were possibly attributed to heat and prescription Lasix. He continues to experience associated symptoms of lightheadedness and pressure in his head.  Describes that the symptoms wax and wane with the most recent being this morning. Modifying factors include rest, drinking fluid and eating on occasion. Continues to experience non-cardiac/orthopedic related chest pain. States that he drinks plenty of water. Notes the frequency of the chest pain is decreased.    No Known Allergies    Outpatient Medications Prior to Visit  Medication Sig Dispense Refill  . acetaminophen (TYLENOL) 500 MG tablet Take 1,000 mg by mouth every 6 (six) hours as needed (pain).    Marland Kitchen allopurinol (ZYLOPRIM) 300 MG tablet take 1 tablet by mouth once daily 30 tablet 3  . aspirin EC 81 MG tablet Take 81 mg by mouth daily.    Marland Kitchen atorvastatin (LIPITOR) 40 MG tablet Take 1 tablet (40 mg total) by mouth daily at 6 PM. 30 tablet 0  . calcitRIOL (ROCALTROL) 0.25 MCG capsule Take 0.25 mcg by mouth every Monday, Wednesday, and Friday.  0  . carvedilol (COREG) 25 MG tablet Take 1 tablet (25 mg total) by mouth 2 (two) times daily. 60 tablet 6  . fenofibrate 54 MG tablet Take 1 tablet (54 mg total) by mouth daily. 30 tablet 11  . furosemide (LASIX)  40 MG tablet Take 3 tablets (120 mg total) by mouth 2 (two) times daily. 30 tablet 0  . furosemide (LASIX) 80 MG tablet Take 1 tablet (80 mg total) by mouth 2 (two) times daily. (Patient taking differently: Take 120 mg by mouth 2 (two) times daily. ) 60 tablet 1  . isosorbide mononitrate (IMDUR) 30 MG 24 hr tablet Take 1 tablet (30 mg total) by mouth daily. 30 tablet 11  . pantoprazole (PROTONIX) 40 MG tablet take 1 tablet by mouth once daily 30 tablet 3  . potassium chloride SA (K-DUR,KLOR-CON) 20 MEQ tablet Take 20 mEq by mouth 2 (two) times daily.    . hydrALAZINE (APRESOLINE) 25 MG tablet Take 1.5 tablets (37.5 mg total) by mouth 3 (three) times daily. 135 tablet 11  . omeprazole (PRILOSEC) 40 MG capsule Take once capsule by mouth twice daily x 1 month then resume daily dosing 60 capsule 0  . ranitidine (ZANTAC) 150 MG tablet Take 1 tablet (150 mg total) by mouth 2 (two) times daily. 60 tablet 0   Facility-Administered Medications Prior to Visit  Medication Dose Route Frequency Provider Last Rate Last Dose  . 0.9 %  sodium chloride infusion  500 mL Intravenous Continuous Ladene Artist, MD        Review of Systems  Constitutional: Negative for chills and fever.  Respiratory: Negative for chest tightness and shortness of breath.   Cardiovascular: Negative for chest pain, palpitations and leg swelling.  Neurological: Positive  for light-headedness. Negative for dizziness and weakness.      Objective:    BP 122/68 (BP Location: Left Arm, Patient Position: Sitting, Cuff Size: Large)   Pulse 77   Temp 98.3 F (36.8 C) (Oral)   Resp 16   Ht 6\' 2"  (1.88 m)   Wt (!) 313 lb (142 kg)   SpO2 98%   BMI 40.19 kg/m  Nursing note and vital signs reviewed.  Physical Exam  Constitutional: He is oriented to person, place, and time. He appears well-developed and well-nourished. No distress.  HENT:  Right Ear: Hearing, tympanic membrane, external ear and ear canal normal.  Left Ear: Hearing,  tympanic membrane, external ear and ear canal normal.  Nose: Nose normal.  Mouth/Throat: Uvula is midline, oropharynx is clear and moist and mucous membranes are normal.  Eyes: Conjunctivae and EOM are normal. Pupils are equal, round, and reactive to light.  Cardiovascular: Normal rate, regular rhythm, normal heart sounds and intact distal pulses.   Pulmonary/Chest: Effort normal and breath sounds normal.  Neurological: He is alert and oriented to person, place, and time. No cranial nerve deficit.  Skin: Skin is warm and dry.  Psychiatric: He has a normal mood and affect. His behavior is normal. Judgment and thought content normal.       Assessment & Plan:   Problem List Items Addressed This Visit      Other   Pain in the chest    Pain in the chest determined not to be of cardiac or orthopedic origin and maintained on proton pump inhibitor for possible gastroesophageal reflux. Does continue to experience mild chest discomfort. Question possible neurological pathology. Refer to neurology for further assessment. Follow-up pending neurological assessment.      Dizziness - Primary    Continues to experience dizziness with normal neurological and cardiac exams. Patient indicates adequate hydration despite current dosage of furosemide. Neurological and cardiac exams benign. Recommend follow-up with referral to neurology for further assessment and treatment.      Relevant Orders   Ambulatory referral to Neurology    Other Visit Diagnoses   None.      I have discontinued Mr. Helderman hydrALAZINE, ranitidine, and omeprazole. I am also having him maintain his furosemide, isosorbide mononitrate, fenofibrate, potassium chloride SA, atorvastatin, carvedilol, calcitRIOL, acetaminophen, furosemide, aspirin EC, allopurinol, and pantoprazole. We will continue to administer sodium chloride.   Follow-up: Return in about 3 months (around 04/01/2016), or if symptoms worsen or fail to  improve.  Mauricio Po, FNP

## 2015-12-31 NOTE — Assessment & Plan Note (Signed)
Continues to experience dizziness with normal neurological and cardiac exams. Patient indicates adequate hydration despite current dosage of furosemide. Neurological and cardiac exams benign. Recommend follow-up with referral to neurology for further assessment and treatment.

## 2015-12-31 NOTE — Assessment & Plan Note (Signed)
Pain in the chest determined not to be of cardiac or orthopedic origin and maintained on proton pump inhibitor for possible gastroesophageal reflux. Does continue to experience mild chest discomfort. Question possible neurological pathology. Refer to neurology for further assessment. Follow-up pending neurological assessment.

## 2016-01-01 ENCOUNTER — Telehealth: Payer: Self-pay | Admitting: Cardiology

## 2016-01-01 NOTE — Telephone Encounter (Signed)
Received a call from patient.Stated he has been having chest pain off and on for the past 2 to 3 weeks.Stated he had episode of fast heart beat and chest pain this morning while driving his car.Stated he went home took medications and pain went away.Stated presently no fast heart beat and only slight chest pain.Advised if chest pain does not go away or becomes worse he needs to go to [facility] ER.Appointment scheduled with Almyra Deforest PA 01/03/16 at 2:30 pm.

## 2016-01-01 NOTE — Telephone Encounter (Signed)
New message     Pt c/o of Chest Pain: STAT if CP now or developed within 24 hours  1. Are you having CP right now? Slight pain - pain level  1-10:  8   2. Are you experiencing any other symptoms (ex. SOB, nausea, vomiting, sweating)? No   3. How long have you been experiencing CP? Couple of week - no emergency   4. Is your CP continuous or coming and going? Coming - going   5. Have you taken Nitroglycerin? No  ?

## 2016-01-03 ENCOUNTER — Ambulatory Visit: Payer: BLUE CROSS/BLUE SHIELD | Admitting: Physician Assistant

## 2016-01-03 NOTE — Progress Notes (Deleted)
Cardiology Office Note    Date:  01/03/2016   ID:  Craig Flynn, DOB Feb 06, 1961, MRN [id number]  PCP:  Mauricio Po, FNP  Cardiologist:  ***   No chief complaint on file.   History of Present Illness:  Craig Flynn is a 55 y.o. male ***    Past Medical History:  Diagnosis Date  . Anemia 06/2015  . Arthritis   . Cardiomyopathy ([place])    a. 10/2013: EF reduced to 35-40% b. 09/2014: EF improved to 55-60%, Grade 1 DD noted.  . Chest pain 06/2015  . CHF (congestive heart failure) ([place])   . CKD (chronic kidney disease), stage IV ([place])   . GERD (gastroesophageal reflux disease)   . Gout   . Hypertension   . OSA (obstructive sleep apnea)    a. on CPAP  . 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 Sleep apnea    not wearing c-pap now, needs new slep study per pt.  . Tuberculosis    back in the 1980's while the pt was in the service.    Past Surgical History:  Procedure Laterality Date  . CHOLECYSTECTOMY    . COLONOSCOPY    . UMBILICAL HERNIA REPAIR    . wisdom teeth extractions      Current Medications: Outpatient Medications Prior to Visit  Medication Sig Dispense Refill  . acetaminophen (TYLENOL) 500 MG tablet Take 1,000 mg by mouth every 6 (six) hours as needed (pain).    Marland Kitchen allopurinol (ZYLOPRIM) 300 MG tablet take 1 tablet by mouth once daily 30 tablet 3  . aspirin EC 81 MG tablet Take 81 mg by mouth daily.    Marland Kitchen atorvastatin (LIPITOR) 40 MG tablet Take 1 tablet (40 mg total) by mouth daily at 6 PM. 30 tablet 0  . calcitRIOL (ROCALTROL) 0.25 MCG capsule Take 0.25 mcg by mouth every Monday, Wednesday, and Friday.  0  . carvedilol (COREG) 25 MG tablet Take 1 tablet (25 mg total) by mouth 2 (two) times daily. 60 tablet 6  . fenofibrate 54 MG tablet Take 1 tablet (54 mg total) by mouth daily. 30 tablet 11  . furosemide (LASIX) 40 MG tablet Take 3 tablets (120 mg total) by mouth 2 (two) times daily. 30  tablet 0  . furosemide (LASIX) 80 MG tablet Take 1 tablet (80 mg total) by mouth 2 (two) times daily. (Patient taking differently: Take 120 mg by mouth 2 (two) times daily. ) 60 tablet 1  . isosorbide mononitrate (IMDUR) 30 MG 24 hr tablet Take 1 tablet (30 mg total) by mouth daily. 30 tablet 11  . pantoprazole (PROTONIX) 40 MG tablet take 1 tablet by mouth once daily 30 tablet 3  . potassium chloride SA (K-DUR,KLOR-CON) 20 MEQ tablet Take 20 mEq by mouth 2 (two) times daily.     Facility-Administered Medications Prior to Visit  Medication Dose Route Frequency Provider Last Rate Last Dose  . 0.9 %  sodium chloride infusion  500 mL Intravenous Continuous Ladene Artist, MD         Allergies:   Review of patient's allergies indicates no known allergies.   Social History   Social History  . Marital status: Legally Separated    Spouse name: N/A  . Number of children: 2  . Years of education: 12   Occupational History  . Disability     Knees    Social History  Main Topics  . Smoking status: Current Every Day Smoker    Packs/day: 0.50    Years: 30.00    Types: Cigarettes  . Smokeless tobacco: Never Used  . Alcohol use 0.0 oz/week     Comment: rarely  . Drug use: No  . Sexual activity: Not on file   Other Topics Concern  . Not on file   Social History Narrative   Fun: Golf, basketball    Denies religious beliefs effecting health care.      Family History:  The patient's ***family history includes Healthy in his maternal grandfather, paternal grandfather, and paternal grandmother; Hypertension in his maternal grandmother; Kidney disease in his maternal grandmother; Pneumonia in his father; Stroke in his mother.   ROS:   Please see the history of present illness.    ROS All other systems reviewed and are negative.   PHYSICAL EXAM:   VS:  There were no vitals taken for this visit.   GEN: Well nourished, well developed, in no acute distress HEENT: normal Neck: no JVD,  carotid bruits, or masses Cardiac: ***RRR; no murmurs, rubs, or gallops,no edema  Respiratory:  clear to auscultation bilaterally, normal work of breathing GI: soft, nontender, nondistended, + BS MS: no deformity or atrophy Skin: warm and dry, no rash Neuro:  Alert and Oriented x 3, Strength and sensation are intact Psych: euthymic mood, full affect  Wt Readings from Last 3 Encounters:  12/31/15 (!) 313 lb (142 kg)  10/22/15 (!) 326 lb (147.9 kg)  10/11/15 (!) 313 lb (142 kg)      Studies/Labs Reviewed:   EKG:  EKG is*** ordered today.  The ekg ordered today demonstrates ***  Recent Labs: 03/06/2015: ALT 20 09/03/2015: B Natriuretic Peptide 18.0 10/10/2015: BUN 54; Creatinine, Ser 6.64; Hemoglobin 11.3; Platelets 256; Potassium 4.6; Sodium 137   Lipid Panel    Component Value Date/Time   CHOL 167 07/19/2015 0243   TRIG 215 (H) 07/19/2015 0243   TRIG 98 03/03/2006 0910   HDL 19 (L) 07/19/2015 0243   CHOLHDL 8.8 07/19/2015 0243   VLDL 43 (H) 07/19/2015 0243   LDLCALC 105 (H) 07/19/2015 0243    Additional studies/ records that were reviewed today include:  ***    ASSESSMENT:    No diagnosis found.   PLAN:  In order of problems listed above:  1. ***    Medication Adjustments/Labs and Tests Ordered: Current medicines are reviewed at length with the patient today.  Concerns regarding medicines are outlined above.  Medication changes, Labs and Tests ordered today are listed in the Patient Instructions below. There are no Patient Instructions on file for this visit.   Hilbert Corrigan, [place]  01/03/2016 6:31 AM    [address], [place], [place]  [address] Phone: [phone]; Fax: [phone]

## 2016-01-04 ENCOUNTER — Encounter (HOSPITAL_COMMUNITY): Payer: Self-pay | Admitting: Emergency Medicine

## 2016-01-04 DIAGNOSIS — R071 Chest pain on breathing: Secondary | ICD-10-CM | POA: Diagnosis present

## 2016-01-04 DIAGNOSIS — Z7982 Long term (current) use of aspirin: Secondary | ICD-10-CM | POA: Insufficient documentation

## 2016-01-04 DIAGNOSIS — N184 Chronic kidney disease, stage 4 (severe): Secondary | ICD-10-CM | POA: Diagnosis not present

## 2016-01-04 DIAGNOSIS — I5189 Other ill-defined heart diseases: Secondary | ICD-10-CM | POA: Diagnosis not present

## 2016-01-04 DIAGNOSIS — R0789 Other chest pain: Secondary | ICD-10-CM | POA: Insufficient documentation

## 2016-01-04 DIAGNOSIS — I509 Heart failure, unspecified: Secondary | ICD-10-CM | POA: Diagnosis not present

## 2016-01-04 DIAGNOSIS — F17210 Nicotine dependence, cigarettes, uncomplicated: Secondary | ICD-10-CM | POA: Insufficient documentation

## 2016-01-04 DIAGNOSIS — I129 Hypertensive chronic kidney disease with stage 1 through stage 4 chronic kidney disease, or unspecified chronic kidney disease: Secondary | ICD-10-CM | POA: Diagnosis not present

## 2016-01-04 LAB — I-STAT TROPONIN, ED: Troponin i, poc: 0.02 ng/mL (ref 0.00–0.08)

## 2016-01-04 LAB — CBC
HCT: 37.4 % — ABNORMAL LOW (ref 39.0–52.0)
Hemoglobin: 11.5 g/dL — ABNORMAL LOW (ref 13.0–17.0)
MCH: 26.1 pg (ref 26.0–34.0)
MCHC: 30.7 g/dL (ref 30.0–36.0)
MCV: 84.8 fL (ref 78.0–100.0)
Platelets: 243 10*3/uL (ref 150–400)
RBC: 4.41 MIL/uL (ref 4.22–5.81)
RDW: 15.5 % (ref 11.5–15.5)
WBC: 6.5 10*3/uL (ref 4.0–10.5)

## 2016-01-04 NOTE — ED Triage Notes (Signed)
Pt. reports central chest pain with SOB and nausea onset this evening , denies emesis or diaphoresis , no fever or chills , his cardiologist is Dr. Percival Spanish .

## 2016-01-05 ENCOUNTER — Other Ambulatory Visit: Payer: Self-pay | Admitting: Gastroenterology

## 2016-01-05 ENCOUNTER — Emergency Department (HOSPITAL_COMMUNITY): Payer: BLUE CROSS/BLUE SHIELD

## 2016-01-05 ENCOUNTER — Emergency Department (HOSPITAL_COMMUNITY)
Admission: EM | Admit: 2016-01-05 | Discharge: 2016-01-05 | Disposition: A | Discharge status: Home / Self Care | Payer: BLUE CROSS/BLUE SHIELD | Attending: Emergency Medicine | Admitting: Emergency Medicine

## 2016-01-05 DIAGNOSIS — K219 Gastro-esophageal reflux disease without esophagitis: Secondary | ICD-10-CM

## 2016-01-05 DIAGNOSIS — R0789 Other chest pain: Secondary | ICD-10-CM

## 2016-01-05 HISTORY — DX: Obesity, unspecified: E66.9

## 2016-01-05 LAB — BASIC METABOLIC PANEL
Anion gap: 11 (ref 5–15)
BUN: 44 mg/dL — ABNORMAL HIGH (ref 6–20)
CO2: 27 mmol/L (ref 22–32)
Calcium: 9.6 mg/dL (ref 8.9–10.3)
Chloride: 100 mmol/L — ABNORMAL LOW (ref 101–111)
Creatinine, Ser: 5.54 mg/dL — ABNORMAL HIGH (ref 0.61–1.24)
GFR calc Af Amer: 12 mL/min — ABNORMAL LOW (ref >60)
GFR calc non Af Amer: 11 mL/min — ABNORMAL LOW (ref >60)
Glucose, Bld: 102 mg/dL — ABNORMAL HIGH (ref 65–99)
Potassium: 4 mmol/L (ref 3.5–5.1)
Sodium: 138 mmol/L (ref 135–145)

## 2016-01-05 LAB — I-STAT TROPONIN, ED: Troponin i, poc: 0.02 ng/mL (ref 0.00–0.08)

## 2016-01-05 MED ORDER — PANTOPRAZOLE SODIUM 40 MG PO TBEC
80.0000 mg | DELAYED_RELEASE_TABLET | Freq: Once | ORAL | Status: AC
  Administered 2016-01-05: 80 mg via ORAL
  Filled 2016-01-05: qty 2

## 2016-01-05 MED ORDER — GI COCKTAIL ~~LOC~~
30.0000 mL | Freq: Once | ORAL | Status: AC
  Administered 2016-01-05: 30 mL via ORAL
  Filled 2016-01-05: qty 30

## 2016-01-05 NOTE — ED Notes (Signed)
Pt reports having palpitations 2 days ago, spoke with cardiologist and told if s/s persisted told to come to ED. Pt c/o pain 3/10 under breasts. NAD. Pt medicated per md ORDER

## 2016-01-05 NOTE — ED Provider Notes (Signed)
TIME SEEN: 4:20 AM  CHIEF COMPLAINT: Chest pain  HPI: Pt is a 55 y.o. male with history of chronic kidney disease, cardiomyopathy, hypertension, obesity who presents the emergency department with complaints of chest pain. He states he has had chest pain intermittently for several years. He describes it as a shooting pain that is also burning. It is not exertional or pleuritic. He does have chronic shortness of breath which he reports is unchanged. He has had some nausea as well. No vomiting, diaphoresis. Also has lightheadedness which is something that has been chronic for him and has plans to follow-up with a neurologist. Has been seen by multiple physicians for these complaints. States he was told that his chest pain could be related to GERD or chest wall pain. Had a stress test in April which showed intermediate risk findings. Not a catheterization candidate at this time because of his chronic kidney disease. States his cardiologist told him that they did not think this was cardiac in nature. No history of PE or DVT. Lower extremity swelling or pain. No recent prolonged immobilization. His fevers or cough. He is not on dialysis. Reports he is still making urine.  April 2017 NM Stress test:  IMPRESSION: 1. Fixed defect in the inferior wall.  2. Inferior hypokinesis.  3. Left ventricular ejection fraction 47%  4. Intermediate-risk stress test findings*.  ROS: See HPI Constitutional: no fever  Eyes: no drainage  ENT: no runny nose   Cardiovascular:   chest pain  Resp: Chronic SOB  GI: no vomiting GU: no dysuria Integumentary: no rash  Allergy: no hives  Musculoskeletal: no leg swelling  Neurological: no slurred speech ROS otherwise negative  PAST MEDICAL HISTORY/PAST SURGICAL HISTORY:  Past Medical History:  Diagnosis Date  . Anemia 06/2015  . Arthritis   . Cardiomyopathy ([place])    a. 10/2013: EF reduced to 35-40% b. 09/2014: EF improved to 55-60%, Grade 1 DD noted.  . Chest  pain 06/2015  . CHF (congestive heart failure) ([place])   . CKD (chronic kidney disease), stage IV ([place])   . GERD (gastroesophageal reflux disease)   . Gout   . Hypertension   . Obesity   . OSA (obstructive sleep apnea)    a. on CPAP  . 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 Sleep apnea    not wearing c-pap now, needs new slep study per pt.  . Tuberculosis    back in the 1980's while the pt was in the service.    MEDICATIONS:  Prior to Admission medications   Medication Sig Start Date End Date Taking? Authorizing Provider  acetaminophen (TYLENOL) 500 MG tablet Take 1,000 mg by mouth every 6 (six) hours as needed (pain).   Yes Historical Provider, MD  allopurinol (ZYLOPRIM) 300 MG tablet take 1 tablet by mouth once daily Patient taking differently: Take 300 mg by mouth once a day 11/08/15  Yes Golden Circle, FNP  aspirin EC 81 MG tablet Take 81 mg by mouth daily.   Yes Historical Provider, MD  calcitRIOL (ROCALTROL) 0.25 MCG capsule Take 0.25 mcg by mouth every Monday, Wednesday, and Friday. 09/20/15  Yes Historical Provider, MD  carvedilol (COREG) 25 MG tablet Take 1 tablet (25 mg total) by mouth 2 (two) times daily. 09/18/15  Yes Minus Breeding, MD  fenofibrate 54 MG tablet Take 1 tablet (54 mg total) by mouth daily. 06/24/15  Yes Minus Breeding, MD  furosemide (LASIX) 80 MG tablet Take 1 tablet (80 mg total) by mouth 2 (two) times daily. 02/27/15  Yes Reyne Dumas, MD  isosorbide mononitrate (IMDUR) 30 MG 24 hr tablet Take 1 tablet (30 mg total) by mouth daily. 06/24/15  Yes Minus Breeding, MD  pantoprazole (PROTONIX) 40 MG tablet take 1 tablet by mouth once daily Patient taking differently: Take 40 mg by mouth once a day 12/11/15  Yes Golden Circle, FNP  potassium chloride SA (K-DUR,KLOR-CON) 20 MEQ tablet Take 20 mEq by mouth 2 (two) times daily.   Yes Historical Provider, MD  atorvastatin (LIPITOR) 40 MG tablet Take 1  tablet (40 mg total) by mouth daily at 6 PM. Patient not taking: Reported on 01/05/2016 07/20/15   Theodis Blaze, MD  furosemide (LASIX) 40 MG tablet Take 3 tablets (120 mg total) by mouth 2 (two) times daily. Patient not taking: Reported on 01/05/2016 09/23/15   Geronimo Boot, MD    ALLERGIES:  No Known Allergies  SOCIAL HISTORY:  Social History  Substance Use Topics  . Smoking status: Current Every Day Smoker    Packs/day: 0.00    Years: 30.00    Types: Cigarettes  . Smokeless tobacco: Never Used  . Alcohol use 0.0 oz/week     Comment: rarely    FAMILY HISTORY: Family History  Problem Relation Age of Onset  . Stroke Mother   . Pneumonia Father     died of Pneumonia x3  . Kidney disease Maternal Grandmother   . Hypertension Maternal Grandmother   . Healthy Maternal Grandfather   . Healthy Paternal Grandmother   . Healthy Paternal Grandfather   . CAD Neg Hx   . Colon cancer Neg Hx   . Esophageal cancer Neg Hx   . Pancreatic cancer Neg Hx   . Prostate cancer Neg Hx   . Stomach cancer Neg Hx   . Rectal cancer Neg Hx     EXAM: BP 143/69 (BP Location: Right Arm)   Pulse 72   Temp 98 F (36.7 C) (Oral)   Resp 18   Ht 6\' 2"  (1.88 m)   Wt (!) 315 lb (142.9 kg)   SpO2 100%   BMI 40.44 kg/m  CONSTITUTIONAL: Alert and oriented and responds appropriately to questions. Well-appearing; well-nourished, obese, no distress HEAD: Normocephalic EYES: Conjunctivae clear, PERRL ENT: normal nose; no rhinorrhea; moist mucous membranes NECK: Supple, no meningismus, no LAD  CARD: RRR; S1 and S2 appreciated; no murmurs, no clicks, no rubs, no gallops CHEST:  Bilateral chest wall is tender to palpation anteriorly without crepitus, ecchymosis, deformity, rash or other lesions RESP: Normal chest excursion without splinting or tachypnea; breath sounds clear and equal bilaterally; no wheezes, no rhonchi, no rales, no hypoxia or respiratory distress, speaking full sentences ABD/GI: Normal  bowel sounds; non-distended; soft, non-tender, no rebound, no guarding, no peritoneal signs BACK:  The back appears normal and is non-tender to palpation, there is no CVA tenderness EXT: Normal ROM in all joints; non-tender to palpation; no edema; normal capillary refill; no cyanosis, no calf tenderness or swelling    SKIN: Normal color for age and race; warm; no rash NEURO: Moves all extremities equally, sensation to light touch intact diffusely, cranial nerves II through XII intact PSYCH: The patient's mood and manner are appropriate. Grooming and personal hygiene are appropriate.  MEDICAL DECISION MAKING: Patient here with what appears to be atypical chest pain. EKG shows no ischemic or melena, arrhythmia or interval changes. Has had the  symptoms for many years intermittently. Recently had a stress test that was intermediate risk. No risk factors for pulmonary embolus other than obesity. Doubt PE as he is not hypoxic, tachycardic, tachypneic. Denies pain is pleuritic in nature. I have low suspicion that this is ACS. He has had 2 negative troponins in the emergency department reports that this is been ongoing for him. When asked why he came to the emergency department tonight, he states "I don't know, just got tired of it". States that it is no different than his previous episodes. Pain has not been exertional. Some of his pain is reproducible with palpation of his chest wall may be musculoskeletal in nature which appears to be what his PCP thought as well. Also possible component of GERD given he did feel he got worse with food and describes a burning pain. We'll give Protonix and GI cocktail and reassess.  ED PROGRESS: His pain has extended significantly improved. I suspect that this is GI in nature. His abdominal exam is benign. He denies any bloody stools, melena, hematemesis. He does have outpatient PCP follow-up and reports outpatient cardiology follow-up as well. States he has follow-up soon with a  neurologist for his dizziness. Denies any vertigo. Reports he is feeling well now. Have offered him admission for chest pain rule out given he does have many risk factors and intermediate risk stress test in April. He declines admission and states he's feeling well and this is something chronic for him and he would like to go home with outpatient follow-up. I've had lengthy discussion of return precautions with patient and his wife. They're comfortable with this plan.   At this time, I do not feel there is any life-threatening condition present. I have reviewed and discussed all results (EKG, imaging, lab, urine as appropriate), exam findings with patient/family. I have reviewed nursing notes and appropriate previous records.  I feel the patient is safe to be discharged home without further emergent workup and can continue workup as an outpatient as needed. Discussed usual and customary return precautions. Patient/family verbalize understanding and are comfortable with this plan.  Outpatient follow-up has been provided. All questions have been answered.      EKG Interpretation  Date/Time:  Saturday January 04 2016 23:40:18 EDT Ventricular Rate:  78 PR Interval:  196 QRS Duration: 80 QT Interval:  400 QTC Calculation: 456 R Axis:   57 Text Interpretation:  Normal sinus rhythm Normal ECG No significant change since last tracing Confirmed by WARD,  DO, KRISTEN ([id number]) on 01/05/2016 4:20:40 AM          [place], DO 01/05/16 2563

## 2016-01-09 ENCOUNTER — Ambulatory Visit: Payer: BLUE CROSS/BLUE SHIELD | Admitting: Physician Assistant

## 2016-01-09 ENCOUNTER — Encounter: Payer: Self-pay | Admitting: Neurology

## 2016-01-09 ENCOUNTER — Ambulatory Visit (INDEPENDENT_AMBULATORY_CARE_PROVIDER_SITE_OTHER): Payer: BLUE CROSS/BLUE SHIELD | Admitting: Neurology

## 2016-01-09 VITALS — BP 120/60 | HR 80 | Resp 18 | Ht 74.0 in | Wt 310.0 lb

## 2016-01-09 DIAGNOSIS — G4489 Other headache syndrome: Secondary | ICD-10-CM | POA: Insufficient documentation

## 2016-01-09 DIAGNOSIS — R42 Dizziness and giddiness: Secondary | ICD-10-CM | POA: Diagnosis not present

## 2016-01-09 DIAGNOSIS — R079 Chest pain, unspecified: Secondary | ICD-10-CM

## 2016-01-09 DIAGNOSIS — K219 Gastro-esophageal reflux disease without esophagitis: Secondary | ICD-10-CM | POA: Diagnosis not present

## 2016-01-09 DIAGNOSIS — K224 Dyskinesia of esophagus: Secondary | ICD-10-CM

## 2016-01-09 DIAGNOSIS — H02403 Unspecified ptosis of bilateral eyelids: Secondary | ICD-10-CM

## 2016-01-09 DIAGNOSIS — H02409 Unspecified ptosis of unspecified eyelid: Secondary | ICD-10-CM | POA: Insufficient documentation

## 2016-01-09 MED ORDER — SUCRALFATE 1 GM/10ML PO SUSP: 1.0000 g | Freq: Three times a day (TID) | ORAL | 3 refills | Status: DC

## 2016-01-09 MED ORDER — METOCLOPRAMIDE HCL 5 MG PO TABS: 5.0000 mg | ORAL_TABLET | Freq: Three times a day (TID) | ORAL | 3 refills | Status: DC

## 2016-01-09 NOTE — Progress Notes (Signed)
[facility]  PATIENT: Craig Flynn DOB: 10/09/1960  REFERRING DOCTOR OR PCP:  Mauricio Po SOURCE: patient, notes from Dr. Elna Breslow and from GI, report of EGD, labs, MRI reports  _________________________________   HISTORICAL  CHIEF COMPLAINT:  Chief Complaint  Patient presents with  . Dizziness    Referral received for facial drooping, slurred speech, but pt. denies ever having these sx.  Sts.  he is here for chest pain that has been r/o as being cardiac in nature.  He does  have GERD, and recent visit to the ER determined chest pain is likely GI in nature.  Pt. sts. PCP sent him here to r/o neurologic cause for chest pain.  Sts. he gets dizzy when he has episodes of chest pain.  Sts. dizziness is some worse with position changes.  Orthostatic VS done today/fim  . Chest Pain    HISTORY OF PRESENT ILLNESS:  I had the pleasure seeing you patient, Craig Flynn, at [facility] for neurologic consultation regarding his chest pain and lightheadedness.  He reports having a colonoscopy and upper endoscopy about 2 months ago. Since then, he has had many episodes of substernal pain that spreads bilaterally under the breast area and around to the flank. With the pain intensifies, he also feels lightheaded. The lightheadedness never occurs by itself. Episodes last 5-15 minutes and occur about twice a day on average. Nothing seems to trigger the events but many occur while he is driving. 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 When the episodes occur nothing really makes them better and he just waits for them to pass. He sometimes will take a Tylenol (has kidney disease cannot take anti-inflammatories).    Occasionally, with one of the episodes, especially after eating,  he will experience nausea and vomiting.     He denies syncope or blacking out of  vision.   He denies numbness or weakness or clumsiness elsewhere in the body. He denies any slurred speech or numbness or weakness in the face.  I reviewed the 10/22/2015 endoscopy study. He had an irregular Z line and small hiatal hernia but otherwise the study was fairly normal.  He also reports headaches that occur separately from the chest discomfort and lightheadedness.   These would typically reach just last 15-20 minutes and have a pressure-like quality. Sometimes he takes a Tylenol with benefit.   The headaches are not associated with any neurologic symptoms.  He notes some urinary frequency since starting Lasix. He is on a high dose 80 mg twice a day for CHF.    He denies any change in his gait.   He is on CPAP for his obstructive sleep apnea and uses it every night. He sleeps much better with his machine.  A 55 year old brain MRI showed that he had some white matter foci, most likely chronic vascular ischemic change.    REVIEW OF SYSTEMS: Constitutional: No fevers, chills, sweats, or change in appetite Eyes: No visual changes, double vision, eye pain Ear, nose and throat: No hearing loss, ear pain, nasal congestion, sore throat Cardiovascular: No palpitations   he has CHF with diastolic dysfunction Respiratory: No shortness of breath at rest or with exertion.   No wheezes.   He is on CPAP for OSA. GastrointestinaI: No nausea, vomiting, diarrhea, abdominal pain, fecal incontinence Genitourinary:  He reports some urinary frequency.  He has chronic renal disease. Musculoskeletal: Some joint pain, occ back pain.  Integumentary: No rash, pruritus, skin lesions Neurological: as above Psychiatric: No depression at this time.  No anxiety Endocrine: No palpitations, diaphoresis, change in appetite, change in weigh or increased thirst Hematologic/Lymphatic: No anemia, purpura, petechiae. Allergic/Immunologic: No itchy/runny eyes, nasal congestion, recent allergic reactions,  rashes  ALLERGIES: No Known Allergies  HOME MEDICATIONS:  Current Outpatient Prescriptions:  .  acetaminophen (TYLENOL) 500 MG tablet, Take 1,000 mg by mouth every 6 (six) hours as needed (pain)., Disp: , Rfl:  .  allopurinol (ZYLOPRIM) 300 MG tablet, take 1 tablet by mouth once daily (Patient taking differently: Take 300 mg by mouth once a day), Disp: 30 tablet, Rfl: 3 .  aspirin EC 81 MG tablet, Take 81 mg by mouth daily., Disp: , Rfl:  .  atorvastatin (LIPITOR) 40 MG tablet, Take 1 tablet (40 mg total) by mouth daily at 6 PM., Disp: 30 tablet, Rfl: 0 .  calcitRIOL (ROCALTROL) 0.25 MCG capsule, Take 0.25 mcg by mouth every Monday, Wednesday, and Friday., Disp: , Rfl: 0 .  carvedilol (COREG) 25 MG tablet, Take 1 tablet (25 mg total) by mouth 2 (two) times daily., Disp: 60 tablet, Rfl: 6 .  fenofibrate 54 MG tablet, Take 1 tablet (54 mg total) by mouth daily., Disp: 30 tablet, Rfl: 11 .  furosemide (LASIX) 80 MG tablet, Take 1 tablet (80 mg total) by mouth 2 (two) times daily., Disp: 60 tablet, Rfl: 1 .  isosorbide mononitrate (IMDUR) 30 MG 24 hr tablet, Take 1 tablet (30 mg total) by mouth daily., Disp: 30 tablet, Rfl: 11 .  pantoprazole (PROTONIX) 40 MG tablet, take 1 tablet by mouth once daily (Patient taking differently: Take 40 mg by mouth once a day), Disp: 30 tablet, Rfl: 3 .  potassium chloride SA (K-DUR,KLOR-CON) 20 MEQ tablet, Take 20 mEq by mouth 2 (two) times daily., Disp: , Rfl:  .  metoCLOPramide (REGLAN) 5 MG tablet, Take 1 tablet (5 mg total) by mouth 3 (three) times daily before meals., Disp: 90 tablet, Rfl: 3 .  omeprazole (PRILOSEC) 40 MG capsule, Take 1 capsule (40 mg total) by mouth daily. (Patient not taking: Reported on 01/09/2016), Disp: 30 capsule, Rfl: 5 .  sucralfate (CARAFATE) 1 GM/10ML suspension, Take 10 mLs (1 g total) by mouth 4 (four) times daily -  with meals and at bedtime., Disp: 420 mL, Rfl: 3  Current Facility-Administered Medications:  .  0.9 %  sodium  chloride infusion, 500 mL, Intravenous, Continuous, Ladene Artist, MD  PAST MEDICAL HISTORY: Past Medical History:  Diagnosis Date  . Anemia 06/2015  . Arthritis   . Cardiomyopathy ([place])    a. 10/2013: EF reduced to 35-40% b. 09/2014: EF improved to 55-60%, Grade 1 DD noted.  . Chest pain 06/2015  . CHF (congestive heart failure) ([place])   . CKD (chronic kidney disease), stage IV ([place])   . GERD (gastroesophageal reflux disease)   . Gout   . Hypertension   . Obesity   . OSA (obstructive sleep apnea)    a. on CPAP  . 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 Sleep apnea    not wearing c-pap now, needs new slep study per pt.  . Tuberculosis    back in the 1980's while the pt was in the service.    PAST SURGICAL HISTORY: Past Surgical History:  Procedure Laterality Date  .  CHOLECYSTECTOMY    . COLONOSCOPY    . UMBILICAL HERNIA REPAIR    . wisdom teeth extractions      FAMILY HISTORY: Family History  Problem Relation Age of Onset  . Stroke Mother   . Pneumonia Father     died of Pneumonia x3  . Kidney disease Maternal Grandmother   . Hypertension Maternal Grandmother   . Healthy Maternal Grandfather   . Healthy Paternal Grandmother   . Healthy Paternal Grandfather   . CAD Neg Hx   . Colon cancer Neg Hx   . Esophageal cancer Neg Hx   . Pancreatic cancer Neg Hx   . Prostate cancer Neg Hx   . Stomach cancer Neg Hx   . Rectal cancer Neg Hx     SOCIAL HISTORY:  Social History   Social History  . Marital status: Legally Separated    Spouse name: N/A  . Number of children: 2  . Years of education: 12   Occupational History  . Disability     Knees    Social History Main Topics  . Smoking status: Current Every Day Smoker    Packs/day: 0.00    Years: 30.00    Types: Cigarettes  . Smokeless tobacco: Never Used  . Alcohol use 0.0 oz/week     Comment: rarely  . Drug use: No  . Sexual activity: Not  on file   Other Topics Concern  . Not on file   Social History Narrative   Fun: Golf, basketball    Denies religious beliefs effecting health care.      PHYSICAL EXAM  Vitals:   01/09/16 0828  BP: 120/60  Pulse: 80  Resp: 18  Weight: (!) 310 lb (140.6 kg)  Height: 6\' 2"  (1.88 m)    Body mass index is 39.8 kg/m.   General: The patient is well-developed and well-nourished and in no acute distress  HEENT:  Head is [place]/AT, shows that he is Mallampati 4.  Funduscopic exam shows normal optic discs and retinal vessels.  Neck: The neck is supple, no carotid bruits are noted.  The neck is nontender.  Cardiovascular: The heart has a regular rate and rhythm with a normal S1 and S2. There were no murmurs, gallops or rubs. Lungs are clear to auscultation.  Skin: Extremities are without significant edema.  Musculoskeletal:  Back is nontender  Neurologic Exam  Mental status: The patient is alert and oriented x 3 at the time of the examination. The patient has apparent normal recent and remote memory, with an apparently normal attention span and concentration ability.   Speech is normal.  Cranial nerves: Extraocular movements are full. He has mild bilateral ptosis. There is no fatiguing on upgaze. Pupils are equal, round, and reactive to light and accomodation.  Visual fields are full.  Facial symmetry is present. There is good facial sensation to soft touch bilaterally.Facial strength is normal.  Trapezius and sternocleidomastoid strength is normal. No dysarthria is noted.  The tongue is midline, and the patient has symmetric elevation of the soft palate. No obvious hearing deficits are noted.  Motor:  He has no proximal weakness and can arise from a chair without using his arms multiple times. Muscle bulk is normal.   Tone is normal. Strength is  5 / 5 in all 4 extremities.   Sensory: Sensory testing is intact to pinprick, soft touch and vibration sensation in all 4 extremities.   Sensory is also intact in the sternal region and down  both flanks in the entire thoracic region.  Coordination: Cerebellar testing reveals good finger-nose-finger bilaterally.  Gait and station: Station is normal.   Gait is normal. Tandem gait is normal. Romberg is negative.   Reflexes: Deep tendon reflexes are symmetric and normal bilaterally.   Plantar responses are flexor.    DIAGNOSTIC DATA (LABS, IMAGING, TESTING) - I reviewed patient records, labs, notes, testing and imaging myself where available.  Lab Results  Component Value Date   WBC 6.5 01/04/2016   HGB 11.5 (L) 01/04/2016   HCT 37.4 (L) 01/04/2016   MCV 84.8 01/04/2016   PLT 243 01/04/2016      Component Value Date/Time   NA 138 01/04/2016 2341   K 4.0 01/04/2016 2341   CL 100 (L) 01/04/2016 2341   CO2 27 01/04/2016 2341   GLUCOSE 102 (H) 01/04/2016 2341   GLUCOSE 130 (H) 03/03/2006 0910   BUN 44 (H) 01/04/2016 2341   CREATININE 5.54 (H) 01/04/2016 2341   CREATININE 4.09 (H) 09/12/2015 1529   CALCIUM 9.6 01/04/2016 2341   PROT 6.9 03/06/2015 1501   ALBUMIN 3.8 03/06/2015 1501   AST 11 03/06/2015 1501   ALT 20 03/06/2015 1501   ALKPHOS 43 03/06/2015 1501   BILITOT 0.4 03/06/2015 1501   GFRNONAA 11 (L) 01/04/2016 2341   GFRAA 12 (L) 01/04/2016 2341   Lab Results  Component Value Date   CHOL 167 07/19/2015   HDL 19 (L) 07/19/2015   LDLCALC 105 (H) 07/19/2015   TRIG 215 (H) 07/19/2015   CHOLHDL 8.8 07/19/2015   Lab Results  Component Value Date   HGBA1C 7.2 (H) 07/18/2015   No results found for: VITAMINB12 Lab Results  Component Value Date   TSH 1.530 11/17/2013       ASSESSMENT AND PLAN  Chest pain, unspecified type  Gastroesophageal reflux disease without esophagitis  Esophageal spasm  Lightheaded  Other headache syndrome   In summary, Mr. Tamargo is a 55 year old man with a two-month history of sternal chest pain that radiates below the breast and into the flanks and is often  associated with lightheadedness. He also gets intermittent headaches that are mild and self-limited. His exam was normal except for ptosis which appears to be old without any other evidence of muscle weakness and a crowded oropharynx.      I believe that the chest pain is due to GI issues as he has no evidence of any neurologic dysfunction. Possibly, he is having esophageal spasms and is triggering a mild vasovagal response causing lightheadedness.    Since GERD can trigger these I will have him add Carafate and Reglan with his meals.   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     Headaches are mild and intermittent and do not need to be treated. 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 He does not have any evidence of myasthenia gravis on exam.  I will see Mr. Pat back if he has worsening neurologic symptoms. We did discuss that ptosis can be seen in certain neurologic disorders (myasthenia gravis) and that he should get back in contact with me if he begins to develop diplopia or proximal weakness.  However, the ptosis is likely idiopathic and he might require surgery if the eyelid drooping affects his driving.  Thank you for asking me to see Mr. Colee. Please let me know if I can be of further assistance with her or other patients in the future.  Acacia Latorre A. Felecia Shelling, MD, PhD 16/38/4665, 9:93 AM Certified in Neurology, Clinical Neurophysiology, Sleep Medicine, Pain Medicine and Neuroimaging  [facility] [address], [place], [place] [address] [phone]

## 2016-02-20 ENCOUNTER — Encounter: Payer: Self-pay | Admitting: Gastroenterology

## 2016-02-20 ENCOUNTER — Other Ambulatory Visit: Payer: Self-pay | Admitting: *Deleted

## 2016-02-20 ENCOUNTER — Ambulatory Visit (INDEPENDENT_AMBULATORY_CARE_PROVIDER_SITE_OTHER): Payer: BLUE CROSS/BLUE SHIELD | Admitting: Gastroenterology

## 2016-02-20 ENCOUNTER — Other Ambulatory Visit: Payer: BLUE CROSS/BLUE SHIELD

## 2016-02-20 VITALS — BP 100/60 | HR 80 | Ht 74.0 in | Wt 300.5 lb

## 2016-02-20 DIAGNOSIS — K219 Gastro-esophageal reflux disease without esophagitis: Secondary | ICD-10-CM

## 2016-02-20 DIAGNOSIS — R152 Fecal urgency: Secondary | ICD-10-CM

## 2016-02-20 DIAGNOSIS — Z86010 Personal history of colonic polyps: Secondary | ICD-10-CM | POA: Diagnosis not present

## 2016-02-20 DIAGNOSIS — N185 Chronic kidney disease, stage 5: Secondary | ICD-10-CM

## 2016-02-20 DIAGNOSIS — R197 Diarrhea, unspecified: Secondary | ICD-10-CM | POA: Diagnosis not present

## 2016-02-20 DIAGNOSIS — Z01810 Encounter for preprocedural cardiovascular examination: Secondary | ICD-10-CM

## 2016-02-20 MED ORDER — DICYCLOMINE HCL 10 MG PO CAPS: 10.0000 mg | ORAL_CAPSULE | Freq: Three times a day (TID) | ORAL | 5 refills | Status: DC

## 2016-02-20 NOTE — Progress Notes (Signed)
    History of Present Illness: This is a 55 year old male who related urgent loose bowel movements following meals since the time of his colonoscopy. No antibiotics or med changes in past few months. No bleeding and abdominal pain noted.   Colonoscopy 10/22/2015 - One 6 mm polyp in the descending colon, removed with a cold snare. Resected and retrieved. - The examination was otherwise normal on direct and retroflexion views.  Pathology: tubular adenom  EGD 10/22/2015 - Z-line variable, at the gastroesophageal junction. Biopsied. - Small hiatal hernia. - Otherwise normal EGD.  Pathology: chronic inflammation  Current Medications, Allergies, Past Medical History, Past Surgical History, Family History and Social History were reviewed in [employer] record.  Physical Exam: General: Well developed, well nourished, no acute distress Head: Normocephalic and atraumatic Eyes:  sclerae anicteric, EOMI Ears: Normal auditory acuity Mouth: No deformity or lesions Lungs: Clear throughout to auscultation Heart: Regular rate and rhythm; no murmurs, rubs or bruits Abdomen: Soft, non tender and non distended. No masses, hepatosplenomegaly or hernias noted. Normal Bowel sounds Musculoskeletal: Symmetrical with no gross deformities  Pulses:  Normal pulses noted Extremities: No clubbing, cyanosis, edema or deformities noted Neurological: Alert oriented x 4, grossly nonfocal Psychological:  Alert and cooperative. Normal mood and affect  Assessment and Recommendations:  1. Urgent post prandial diarrhea. Discontinue Reglan. GI pathogen panel. Bentyl 10 mg ac if symptoms persist for suspected IBS. REV in 6-8 weeks.  2. GERD. Continue antireflux measures and pantoprazole 40 mg po daily. Carafate qid prn if it helps his symptoms.   3.  Atypical chest pain. Does not appear to be reflux related. No GI cause found.   4. Normocytic anemia. Further evaluation per PCP.   5. Hx of  adenomatous colon polyps. Recommend a 5 year interval colonoscopy in 10/2020.

## 2016-02-20 NOTE — Patient Instructions (Signed)
Your physician has requested that you go to the basement for the following lab work before leaving today: GI pathogen panel.  Discontinue reglan.  We have sent the following medications to your pharmacy for you to pick up at your convenience:Bentyl.  Normal BMI (Body Mass Index- based on height and weight) is between 19 and 25. Your BMI today is Body mass index is 38.58 kg/m. Marland Kitchen Please consider follow up  regarding your BMI with your Primary Care Provider.  Thank you for choosing me and [facility].  Pricilla Riffle. Dagoberto Ligas., MD., [place]al

## 2016-02-25 ENCOUNTER — Telehealth: Payer: Self-pay | Admitting: Cardiology

## 2016-02-25 NOTE — Telephone Encounter (Signed)
Received records from [facility] for appointment on 03/06/16 with Dr Percival Spanish.  Records given to [facility] (medical records) for Dr Hochrein's schedule on 03/06/16. lp

## 2016-03-01 NOTE — Progress Notes (Signed)
Cardiology Office Note   Date:  03/02/2016   ID:  Craig Flynn, DOB 13-Apr-1960, MRN [id number]  PCP:  Mauricio Po, FNP  Cardiologist:   Minus Breeding, MD   Chief Complaint  Patient presents with  . Cardiomyopathy     History of Present Illness: Craig Flynn is a 55 y.o. male who presents for evaluation of cardiomyopathy and hypertension. The patient has long-standing hypertension. He was seen a couple of years ago with mildly reduced ejection fraction of about 45%. In July 2016 his EF was said to be about 55%.  Earlier this year he was in the hospital with chest pain.  He had a a perfusion defect that was fixed in the inferior wall with an EF of 47%.  No further work up was suggested.   He had chest pain again on 6/17 and called [place].   He was most recently in the ED in Oct and I reviewed these records.  He was thought to have non anginal pain.  He continues to get sporadic pain similar to his previous.  It is across his anterior chest and might come on for a few minutes.  It is at rest and a stable pattern without associated symptoms.  He can walk at the gym without this happening.     Past Medical History:  Diagnosis Date  . Anemia 06/2015  . Arthritis   . Cardiomyopathy ([place])    a. 10/2013: EF reduced to 35-40% b. 09/2014: EF improved to 55-60%, Grade 1 DD noted.  . Chest pain 06/2015  . CHF (congestive heart failure) ([place])   . CKD (chronic kidney disease), stage IV ([place])   . GERD (gastroesophageal reflux disease)   . Gout   . Hypertension   . Obesity   . OSA (obstructive sleep apnea)    a. on CPAP  . 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 Sleep apnea    not wearing c-pap now, needs new slep study per pt.  . Tuberculosis    back in the 1980's while the pt was in the service.  . Tubular adenoma of colon 10/2015    Past Surgical History:  Procedure Laterality Date  . CHOLECYSTECTOMY    . COLONOSCOPY     . UMBILICAL HERNIA REPAIR    . wisdom teeth extractions       Current Outpatient Prescriptions  Medication Sig Dispense Refill  . acetaminophen (TYLENOL) 500 MG tablet Take 1,000 mg by mouth every 6 (six) hours as needed (pain).    Marland Kitchen allopurinol (ZYLOPRIM) 300 MG tablet take 1 tablet by mouth once daily (Patient taking differently: Take 300 mg by mouth once a day) 30 tablet 3  . aspirin EC 81 MG tablet Take 81 mg by mouth daily.    . carvedilol (COREG) 25 MG tablet Take 1 tablet (25 mg total) by mouth 2 (two) times daily. 60 tablet 6  . dicyclomine (BENTYL) 10 MG capsule Take 1 capsule (10 mg total) by mouth 4 (four) times daily -  before meals and at bedtime. 120 capsule 5  . fenofibrate 54 MG tablet Take 1 tablet (54 mg total) by mouth daily. 30 tablet 11  . furosemide (LASIX) 80 MG tablet Take 1 tablet (80 mg total) by mouth 2 (two) times daily. 60 tablet 1  . isosorbide mononitrate (IMDUR) 60 MG 24 hr tablet Take 1 tablet (60 mg total) by mouth  daily. 30 tablet 11  . metoCLOPramide (REGLAN) 5 MG tablet Take 1 tablet (5 mg total) by mouth 3 (three) times daily before meals. 90 tablet 3  . potassium chloride SA (K-DUR,KLOR-CON) 20 MEQ tablet Take 20 mEq by mouth 2 (two) times daily.    . sucralfate (CARAFATE) 1 GM/10ML suspension Take 10 mLs (1 g total) by mouth 4 (four) times daily -  with meals and at bedtime. 420 mL 3  . nitroGLYCERIN (NITROSTAT) 0.4 MG SL tablet Place 1 tablet (0.4 mg total) under the tongue every 5 (five) minutes as needed for chest pain. 25 tablet 3   No current facility-administered medications for this visit.     Allergies:   Patient has no known allergies.   ROS:  Please see the history of present illness.   Otherwise, review of systems are positive for none.   All other systems are reviewed and negative.    PHYSICAL EXAM: VS:  BP 100/60 (BP Location: Left Arm, Patient Position: Sitting, Cuff Size: Normal)   Pulse 80   Ht 6\' 2"  (1.88 m)   Wt 297 lb  (134.7 kg)   SpO2 97%   BMI 38.13 kg/m  , BMI Body mass index is 38.13 kg/m. GENERAL:  Well appearing HEENT:  Pupils equal round and reactive, fundi not visualized, oral mucosa unremarkable NECK:  No jugular venous distention, waveform within normal limits, carotid upstroke brisk and symmetric, no bruits, no thyromegaly LYMPHATICS:  No cervical, inguinal adenopathy LUNGS:  Clear to auscultation bilaterally BACK:  No CVA tenderness CHEST:  Unremarkable HEART:  PMI not displaced or sustained,S1 and S2 within normal limits, no S3, no S4, no clicks, no rubs, no murmurs ABD:  Flat, positive bowel sounds normal in frequency in pitch, no bruits, no rebound, no guarding, no midline pulsatile mass, no hepatomegaly, no splenomegaly EXT:  2 plus pulses throughout, moderate edema chronic right greater than left leg swelling, no cyanosis no clubbing SKIN:  No rashes no nodules, chronic venous stasis changes    EKG:  EKG is not  ordered today. I did review the EKG from 10/14.  Patient had sinus rhythm, rate 78, axis within normal limits, intervals within normal limits, no acute ST-T wave changes.    Recent Labs: 03/06/2015: ALT 20 09/03/2015: B Natriuretic Peptide 18.0 01/04/2016: BUN 44; Creatinine, Ser 5.54; Hemoglobin 11.5; Platelets 243; Potassium 4.0; Sodium 138    Lipid Panel    Component Value Date/Time   CHOL 167 07/19/2015 0243   TRIG 215 (H) 07/19/2015 0243   TRIG 98 03/03/2006 0910   HDL 19 (L) 07/19/2015 0243   CHOLHDL 8.8 07/19/2015 0243   VLDL 43 (H) 07/19/2015 0243   LDLCALC 105 (H) 07/19/2015 0243      Wt Readings from Last 3 Encounters:  03/02/16 297 lb (134.7 kg)  02/20/16 (!) 300 lb 8 oz (136.3 kg)  01/09/16 (!) 310 lb (140.6 kg)      Other studies Reviewed: Additional studies/ records that were reviewed today include: ED records  Review of the above records demonstrates:  Please see elsewhere in the note.     ASSESSMENT AND PLAN:  CARDIOMYOPATHY:  His  ejection fraction is mildly reduced.  He has less lower extremity swelling than in the past. was improved at the most recent evaluation. He does have significant lower extremity swelling.  He is following up with his nephrologist. He will remain on the meds as listed.   HTN:  This is being managed in the  context of treating his CHF. The blood pressure is at target. No change in medications is indicated. We will continue with therapeutic lifestyle changes (TLC).  OBESITY:  The patient understands the need to lose weight with diet and exercise. We have discussed specific strategies for this in great detail in the past.   CKD:  He follows up with nephrology.  CHEST PAIN:  I reviewed the ED records. I will increase his Imdur to 60 mg daily.  In addition, he will get SLNTG for prn use.  He had no high risk findings on perfusion study earlier this year.  If he continues to have pain and is on dialysis we could consider cath in the future.   THYROID NODULE:  I do note that he had a thyroid nodule on a CT scan was done which he was hospitalized earlier this year. I was not the ordering treating physician for will follow-up on this with a thyroid scan.   Current medicines are reviewed at length with the patient today.  The patient does not have concerns regarding medicines.  The following changes have been made:  As above  Labs/ tests ordered today include:   Orders Placed This Encounter  Procedures  . US THYROID     Disposition:   FU with APP in 4 months.    Signed, Minus Breeding, MD  03/02/2016 9:49 AM    [place]

## 2016-03-02 ENCOUNTER — Encounter: Payer: Self-pay | Admitting: Cardiology

## 2016-03-02 ENCOUNTER — Ambulatory Visit (INDEPENDENT_AMBULATORY_CARE_PROVIDER_SITE_OTHER): Payer: BLUE CROSS/BLUE SHIELD | Admitting: Cardiology

## 2016-03-02 VITALS — BP 100/60 | HR 80 | Ht 74.0 in | Wt 297.0 lb

## 2016-03-02 DIAGNOSIS — E041 Nontoxic single thyroid nodule: Secondary | ICD-10-CM | POA: Diagnosis not present

## 2016-03-02 DIAGNOSIS — R0789 Other chest pain: Secondary | ICD-10-CM | POA: Diagnosis not present

## 2016-03-02 DIAGNOSIS — I429 Cardiomyopathy, unspecified: Secondary | ICD-10-CM

## 2016-03-02 MED ORDER — ISOSORBIDE MONONITRATE ER 60 MG PO TB24: 60.0000 mg | ORAL_TABLET | Freq: Every day | ORAL | 11 refills | Status: DC

## 2016-03-02 MED ORDER — NITROGLYCERIN 0.4 MG SL SUBL: 0.4000 mg | SUBLINGUAL_TABLET | SUBLINGUAL | 3 refills | Status: DC | PRN

## 2016-03-02 NOTE — Patient Instructions (Signed)
Medication Instructions:  INCREASE- Isosorbide 60 mg daily  Labwork: None Ordered  Testing/Procedures: Your physician has requested that you have an ultrasound of your Thyroid  Follow-Up: Your physician recommends that you schedule a follow-up appointment in: 4 Months with Rosaria Ferries   Any Other Special Instructions Will Be Listed Below (If Applicable).   If you need a refill on your cardiac medications before your next appointment, please call your pharmacy.

## 2016-03-03 ENCOUNTER — Encounter: Payer: Self-pay | Admitting: Vascular Surgery

## 2016-03-03 ENCOUNTER — Encounter (HOSPITAL_COMMUNITY): Payer: Self-pay | Admitting: *Deleted

## 2016-03-03 ENCOUNTER — Emergency Department (HOSPITAL_COMMUNITY)
Admission: EM | Admit: 2016-03-03 | Discharge: 2016-03-03 | Disposition: A | Discharge status: Home / Self Care | Payer: BLUE CROSS/BLUE SHIELD | Attending: Emergency Medicine | Admitting: Emergency Medicine

## 2016-03-03 ENCOUNTER — Other Ambulatory Visit: Payer: Self-pay

## 2016-03-03 ENCOUNTER — Telehealth: Payer: Self-pay | Admitting: Family

## 2016-03-03 ENCOUNTER — Emergency Department (HOSPITAL_COMMUNITY): Payer: BLUE CROSS/BLUE SHIELD

## 2016-03-03 DIAGNOSIS — I130 Hypertensive heart and chronic kidney disease with heart failure and stage 1 through stage 4 chronic kidney disease, or unspecified chronic kidney disease: Secondary | ICD-10-CM | POA: Insufficient documentation

## 2016-03-03 DIAGNOSIS — F17210 Nicotine dependence, cigarettes, uncomplicated: Secondary | ICD-10-CM | POA: Diagnosis not present

## 2016-03-03 DIAGNOSIS — Z7982 Long term (current) use of aspirin: Secondary | ICD-10-CM | POA: Insufficient documentation

## 2016-03-03 DIAGNOSIS — R42 Dizziness and giddiness: Secondary | ICD-10-CM | POA: Diagnosis not present

## 2016-03-03 DIAGNOSIS — I5022 Chronic systolic (congestive) heart failure: Secondary | ICD-10-CM | POA: Insufficient documentation

## 2016-03-03 DIAGNOSIS — N184 Chronic kidney disease, stage 4 (severe): Secondary | ICD-10-CM | POA: Diagnosis not present

## 2016-03-03 DIAGNOSIS — R079 Chest pain, unspecified: Secondary | ICD-10-CM | POA: Diagnosis not present

## 2016-03-03 DIAGNOSIS — Z79899 Other long term (current) drug therapy: Secondary | ICD-10-CM | POA: Insufficient documentation

## 2016-03-03 LAB — BASIC METABOLIC PANEL WITH GFR
Anion gap: 11 (ref 5–15)
BUN: 67 mg/dL — ABNORMAL HIGH (ref 6–20)
CO2: 22 mmol/L (ref 22–32)
Calcium: 9.8 mg/dL (ref 8.9–10.3)
Chloride: 106 mmol/L (ref 101–111)
Creatinine, Ser: 6.26 mg/dL — ABNORMAL HIGH (ref 0.61–1.24)
GFR calc Af Amer: 10 mL/min — ABNORMAL LOW
GFR calc non Af Amer: 9 mL/min — ABNORMAL LOW
Glucose, Bld: 102 mg/dL — ABNORMAL HIGH (ref 65–99)
Potassium: 4 mmol/L (ref 3.5–5.1)
Sodium: 139 mmol/L (ref 135–145)

## 2016-03-03 LAB — I-STAT TROPONIN, ED: Troponin i, poc: 0.01 ng/mL (ref 0.00–0.08)

## 2016-03-03 LAB — CBC
HCT: 35.3 % — ABNORMAL LOW (ref 39.0–52.0)
Hemoglobin: 11.2 g/dL — ABNORMAL LOW (ref 13.0–17.0)
MCH: 26.1 pg (ref 26.0–34.0)
MCHC: 31.7 g/dL (ref 30.0–36.0)
MCV: 82.3 fL (ref 78.0–100.0)
Platelets: 270 K/uL (ref 150–400)
RBC: 4.29 MIL/uL (ref 4.22–5.81)
RDW: 15.7 % — ABNORMAL HIGH (ref 11.5–15.5)
WBC: 5.1 K/uL (ref 4.0–10.5)

## 2016-03-03 MED ORDER — ISOSORBIDE MONONITRATE ER 30 MG PO TB24: 60.0000 mg | ORAL_TABLET | Freq: Every day | ORAL | 1 refills | Status: DC

## 2016-03-03 NOTE — ED Provider Notes (Signed)
[facility] DEPT Provider Note   CSN: [id number] Arrival date & time: 03/03/16  1229   History   Chief Complaint Chief Complaint  Patient presents with  . Chest Pain  . Dizziness  . Near Syncope    HPI Craig Flynn is a 55 y.o. male presenting to the ED with lightheadedness that started this morning at 8:30AM. The lightheadedness occurred when when he was standing at a counter paying a bill. He went and sat down in his car and then lightheadedness resolved. He then went home and had more lightheadedness while walking up the stairs. He states he has frequent episodes of lightheadedness at baseline, but he came into the ED today because his lightheadedness lasted longer than usual. He also endorses chest pain, but states that this is the same as his usual chest pain, which has been going on for ~9 months. He has seen a cardiologist, neurologist, orthopedic surgeon, and gastroenterologist for this chest pain. The chest pain starts in the middle of his chest and wraps around both sides of his chest. The chest pain is sharp. The chest is not any different than his normal chest pain. Pt denies any palpitations, shortness of breath, fevers, headaches, changes in vision, leg/arm weakness.   Of note, Pt's BPs have been low recently. His nephrologist stopped his Hydralazine recently because his blood pressures were too low. He saw his Cardiologist yesterday, who increased his Imdur from 30mg  daily to 60mg  daily to try to help the chest pain. 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 He is scheduled to have a fistula placed on Friday.  HPI  Past Medical History:  Diagnosis Date  . Anemia 06/2015  . Arthritis   . Cardiomyopathy ([place])    a. 10/2013: EF reduced to 35-40% b. 09/2014: EF improved to 55-60%, Grade 1 DD noted.  . Chest pain 06/2015  . CHF (congestive heart failure) ([place])   . CKD (chronic kidney disease), stage IV ([place])   . GERD (gastroesophageal reflux disease)   . Gout   .  Hypertension   . Obesity   . OSA (obstructive sleep apnea)    a. on CPAP  . 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 Sleep apnea    not wearing c-pap now, needs new slep study per pt.  . Tuberculosis    back in the 1980's while the pt was in the service.  . Tubular adenoma of colon 10/2015    Patient Active Problem List   Diagnosis Date Noted  . Esophageal spasm 01/09/2016  . Lightheaded 01/09/2016  . Other headache syndrome 01/09/2016  . Ptosis 01/09/2016  . Rectal bleeding 09/10/2015  . Dizziness 08/27/2015  . Fullness of abdomen 08/27/2015  . Chronic systolic heart failure ([place]) 08/01/2015  . GERD (gastroesophageal reflux disease) 07/29/2015  . Pain in the chest   . CKD (chronic kidney disease)   . Osteoarthritis 05/03/2015  . Hyperlipidemia 02/26/2015  . Acute bronchitis 02/26/2015  . Diastolic dysfunction 09/38/1829  . Tobacco abuse 02/26/2015  . Atypical chest pain 02/25/2015  . Neck muscle spasm 01/15/2015  . OSA (obstructive sleep apnea) 12/27/2014  . Snoring 11/12/2014  . Routine general medical examination at a health care facility 11/12/2014  . Anemia in chronic kidney disease 11/12/2014  . Left knee pain 09/22/2014  . Chest pain 09/21/2014  . AKI (acute kidney injury) ([place]) 09/21/2014  . Systolic CHF ([place]) 93/71/6967  .  Paresthesia of left leg 09/21/2014  . Acute systolic CHF (congestive heart failure) ([place]) 11/17/2013  . CKD (chronic kidney disease) stage 4, GFR 15-29 ml/min (HCC) 11/17/2013  . Morbid obesity ([place]) 11/17/2013  . Diabetes mellitus ([place]) 12/12/2011  . Acute on chronic renal failure ([place]) 12/11/2011  . Hypokalemia 12/11/2011  . HTN (hypertension) 12/11/2011  . Dyslipidemia 12/31/2006  . Gout 12/31/2006  . Essential hypertension 12/31/2006  . 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 BACKACHE NOS 12/29/2006    Past Surgical History:  Procedure Laterality Date    . CHOLECYSTECTOMY    . COLONOSCOPY    . UMBILICAL HERNIA REPAIR    . wisdom teeth extractions         Home Medications    Prior to Admission medications   Medication Sig Start Date End Date Taking? Authorizing Provider  acetaminophen (TYLENOL) 500 MG tablet Take 1,000 mg by mouth every 6 (six) hours as needed (pain).   Yes Historical Provider, MD  allopurinol (ZYLOPRIM) 300 MG tablet take 1 tablet by mouth once daily Patient taking differently: Take 300 mg by mouth once a day 11/08/15  Yes Golden Circle, FNP  aspirin EC 81 MG tablet Take 81 mg by mouth daily.   Yes Historical Provider, MD  carvedilol (COREG) 25 MG tablet Take 1 tablet (25 mg total) by mouth 2 (two) times daily. 09/18/15  Yes Minus Breeding, MD  fenofibrate 54 MG tablet Take 1 tablet (54 mg total) by mouth daily. 06/24/15  Yes Minus Breeding, MD  furosemide (LASIX) 80 MG tablet Take 1 tablet (80 mg total) by mouth 2 (two) times daily. 02/27/15  Yes Reyne Dumas, MD  metoCLOPramide (REGLAN) 5 MG tablet Take 1 tablet (5 mg total) by mouth 3 (three) times daily before meals. Patient taking differently: Take 5 mg by mouth QID.  01/09/16  Yes Britt Bottom, MD  nitroGLYCERIN (NITROSTAT) 0.4 MG SL tablet Place 1 tablet (0.4 mg total) under the tongue every 5 (five) minutes as needed for chest pain. 03/02/16 05/31/16 Yes Minus Breeding, MD  pantoprazole (PROTONIX) 40 MG tablet Take 40 mg by mouth daily. 12/11/15  Yes Historical Provider, MD  potassium chloride SA (K-DUR,KLOR-CON) 20 MEQ tablet Take 20 mEq by mouth 2 (two) times daily.   Yes Historical Provider, MD  dicyclomine (BENTYL) 10 MG capsule Take 1 capsule (10 mg total) by mouth 4 (four) times daily -  before meals and at bedtime. Patient not taking: Reported on 03/03/2016 02/20/16   Ladene Artist, MD  isosorbide mononitrate (IMDUR) 30 MG 24 hr tablet Take 2 tablets (60 mg total) by mouth daily. 03/03/16   Sela Hua, MD  sucralfate (CARAFATE) 1 GM/10ML suspension  Take 10 mLs (1 g total) by mouth 4 (four) times daily -  with meals and at bedtime. Patient not taking: Reported on 03/03/2016 01/09/16   Britt Bottom, MD    Family History Family History  Problem Relation Age of Onset  . Stroke Mother   . Pneumonia Father     died of Pneumonia x3  . Kidney disease Maternal Grandmother   . Hypertension Maternal Grandmother   . Healthy Maternal Grandfather   . Healthy Paternal Grandmother   . Healthy Paternal Grandfather   . CAD Neg Hx   . Colon cancer Neg Hx   . Esophageal cancer Neg Hx   . Pancreatic cancer Neg Hx   . Prostate cancer Neg Hx   . Stomach cancer Neg Hx   .  Rectal cancer Neg Hx     Social History Social History  Substance Use Topics  . Smoking status: Current Every Day Smoker    Packs/day: 0.00    Years: 30.00    Types: Cigarettes  . Smokeless tobacco: Never Used     Comment: only 3 cigarettes per day  . Alcohol use 0.0 oz/week     Comment: rarely     Allergies   Patient has no known allergies.   Review of Systems Review of Systems 10 Systems reviewed and are negative for acute change except as noted in the HPI.  Physical Exam Updated Vital Signs BP 134/76   Pulse 69   Temp 97.7 F (36.5 C) (Oral)   Resp 16   Ht 6\' 2"  (1.88 m)   Wt 135.2 kg   SpO2 100%   BMI 38.27 kg/m   Physical Exam  Constitutional: He is oriented to person, place, and time. He appears well-developed and well-nourished. No distress.  HENT:  Head: Normocephalic and atraumatic.  Eyes: Conjunctivae and EOM are normal. Pupils are equal, round, and reactive to light.  Neck: Normal range of motion. Neck supple.  Cardiovascular: Normal rate and regular rhythm.   No murmur heard. Pulmonary/Chest: Effort normal and breath sounds normal. No respiratory distress. He has no wheezes.  Abdominal: Soft. Bowel sounds are normal. He exhibits no distension. There is no tenderness. There is no rebound and no guarding.  Musculoskeletal: He exhibits  no edema.  Neurological: He is alert and oriented to person, place, and time. He displays normal reflexes. No cranial nerve deficit or sensory deficit. He exhibits normal muscle tone. Coordination normal.  Skin: Skin is warm and dry.  Psychiatric: He has a normal mood and affect. His behavior is normal. Thought content normal.  Nursing note and vitals reviewed.    ED Treatments / Results  Labs (all labs ordered are listed, but only abnormal results are displayed) Labs Reviewed  BASIC METABOLIC PANEL - Abnormal; Notable for the following:       Result Value   Glucose, Bld 102 (*)    BUN 67 (*)    Creatinine, Ser 6.26 (*)    GFR calc non Af Amer 9 (*)    GFR calc Af Amer 10 (*)    All other components within normal limits  CBC - Abnormal; Notable for the following:    Hemoglobin 11.2 (*)    HCT 35.3 (*)    RDW 15.7 (*)    All other components within normal limits  I-STAT TROPOININ, ED    EKG  EKG Interpretation None       Radiology Dg Chest 2 View  Result Date: 03/03/2016 CLINICAL DATA:  55 year old male with chest pain, dizziness and shortness of breath. Initial encounter. EXAM: CHEST  2 VIEW COMPARISON:  01/04/2016 and earlier. FINDINGS: Stable lung volumes. The costophrenic angles are not entirely included on today frontal view. Mediastinal contours remain normal. Visualized tracheal air column is within normal limits. Mild increased interstitial pulmonary markings are stable with no pneumothorax, pulmonary edema, pleural effusion or acute pulmonary opacity. No acute osseous abnormality identified. IMPRESSION: No acute cardiopulmonary abnormality. Electronically Signed   By: Genevie Ann M.D.   On: 03/03/2016 13:42    Procedures Procedures (including critical care time)  Medications Ordered in ED Medications - No data to display   Initial Impression / Assessment and Plan / ED Course  I have reviewed the triage vital signs and the nursing notes.  Pertinent  labs &  imaging results that were available during my care of the patient were reviewed by me and considered in my medical decision making (see chart for details).  Clinical Course    Craig Flynn is a 55 year old male presenting to the ED with lightheadedness that lasted longer than his usual lightheadedness. He also endorses chest pain that is the exact same chest pain that he has had for the last 9 months. I think his lightheadedness is likely related to the increase in his Imdur from 30mg  to 60mg  yesterday. His ESRD may also be contributing to his symptoms, as Pt has a Cr of 6.26 and is scheduled to have a fistula placed on Friday. Hgb was at his baseline of 11.7, so symptomatic anemia ruled out. No fevers, cough, URI symptoms, urinary symptoms, or tachycardia to suggest infection. No palpitations or loss of consciousness to suggest arrhythmia. ACS unlikely without change from baseline chest pain. EKG with NSR and trop 0.01.   3:40PM: Orthostatic vitals negative.  3:45PM: Pt able to ambulate in the hall without lightheadedness. Think he is safe for discharge home. Will decrease his Imdur dose back from 60mg  to 30mg  and have patient follow-up with his PCP and cardiologist.  Final Clinical Impressions(s) / ED Diagnoses   Final diagnoses:  Lightheadedness    New Prescriptions Current Discharge Medication List       Sela Hua, MD 03/03/16 1603    Sela Hua, MD 03/03/16 1621    Merrily Pew, MD 03/05/16 1544

## 2016-03-03 NOTE — Discharge Instructions (Signed)
You came to the emergency department because your lightheadedness lasted longer today than it has in the past. All of your labs came back normal. We think your lightheadedness may be related to the recent increase in your Imdur dose. We will decrease this dose back to 30mg  daily. You should follow-up with your cardiologist to discuss this further.

## 2016-03-03 NOTE — ED Triage Notes (Signed)
Pt reports sob and chest pressure but states that is normal for him. This am was feeling lightheaded and near syncopal, sent here by pcp. Denies increase in leg swelling. Airway intact and ekg done at triage.

## 2016-03-03 NOTE — Telephone Encounter (Signed)
Patient Name: Craig Flynn  DOB: Dec 12, 1960    Initial Comment caller states he has dizziness and light headed   Nurse Assessment  Nurse: Leilani Merl, RN, [place] Date/Time (Eastern Time): 03/03/2016 12:15:06 PM  Confirm and document reason for call. If symptomatic, describe symptoms. ---Caller states that he woke up with dizziness this morning and he felt lightheaded like he might faint, but didn't.  Does the patient have any new or worsening symptoms? ---Yes  Will a triage be completed? ---Yes  Related visit to physician within the last 2 weeks? ---No  Does the PT have any chronic conditions? (i.e. diabetes, asthma, etc.) ---Yes  List chronic conditions. ---See MR  Is this a behavioral health or substance abuse call? ---No     Guidelines    Guideline Title Affirmed Question Affirmed Notes  Dizziness - Lightheadedness [1] Dizziness caused by heat exposure, sudden standing, or poor fluid intake AND [2] no improvement after 2 hours of rest and fluids    Final Disposition User   See Physician within 4 Hours (or PCP triage) Leilani Merl, RN, [place]    Referrals  [facility] - ED   Disagree/Comply: Comply

## 2016-03-06 ENCOUNTER — Ambulatory Visit
Admission: RE | Admit: 2016-03-06 | Discharge: 2016-03-06 | Disposition: A | Discharge status: Home / Self Care | Payer: BLUE CROSS/BLUE SHIELD | Source: Ambulatory Visit | Attending: Cardiology | Admitting: Cardiology

## 2016-03-06 ENCOUNTER — Ambulatory Visit: Payer: BLUE CROSS/BLUE SHIELD | Admitting: Cardiology

## 2016-03-06 DIAGNOSIS — E041 Nontoxic single thyroid nodule: Secondary | ICD-10-CM

## 2016-03-09 ENCOUNTER — Encounter: Payer: Self-pay | Admitting: Cardiology

## 2016-03-09 ENCOUNTER — Encounter: Payer: Self-pay | Admitting: Vascular Surgery

## 2016-03-09 ENCOUNTER — Ambulatory Visit (INDEPENDENT_AMBULATORY_CARE_PROVIDER_SITE_OTHER)
Admission: RE | Admit: 2016-03-09 | Discharge: 2016-03-09 | Disposition: A | Discharge status: Home / Self Care | Payer: BLUE CROSS/BLUE SHIELD | Source: Ambulatory Visit | Attending: Vascular Surgery | Admitting: Vascular Surgery

## 2016-03-09 ENCOUNTER — Ambulatory Visit (HOSPITAL_COMMUNITY)
Admission: RE | Admit: 2016-03-09 | Discharge: 2016-03-09 | Disposition: A | Discharge status: Home / Self Care | Payer: BLUE CROSS/BLUE SHIELD | Source: Ambulatory Visit | Attending: Vascular Surgery | Admitting: Vascular Surgery

## 2016-03-09 ENCOUNTER — Other Ambulatory Visit: Payer: Self-pay

## 2016-03-09 ENCOUNTER — Ambulatory Visit (INDEPENDENT_AMBULATORY_CARE_PROVIDER_SITE_OTHER): Payer: BLUE CROSS/BLUE SHIELD | Admitting: Vascular Surgery

## 2016-03-09 ENCOUNTER — Other Ambulatory Visit: Payer: Self-pay | Admitting: Family

## 2016-03-09 VITALS — BP 117/65 | HR 67 | Temp 97.1°F | Resp 20 | Ht 74.0 in | Wt 296.8 lb

## 2016-03-09 DIAGNOSIS — N185 Chronic kidney disease, stage 5: Secondary | ICD-10-CM

## 2016-03-09 DIAGNOSIS — Z01810 Encounter for preprocedural cardiovascular examination: Secondary | ICD-10-CM | POA: Diagnosis not present

## 2016-03-09 DIAGNOSIS — N184 Chronic kidney disease, stage 4 (severe): Secondary | ICD-10-CM

## 2016-03-09 NOTE — Progress Notes (Signed)
[facility] HISTORY AND PHYSICAL   History of Present Illness:  Patient is a 55 y.o. year old male who presents for placement of a permanent hemodialysis access. The patient is right handed.  The patient is not currently on hemodialysis.  The cause of renal failure is thought to be secondary to hypertension.  His primary nephrologist is Dr. Lorrene Reid. Other chronic medical problems include anemia, arthritis, cardiomyopathy, congestive failure, reflux, gout, hypertension, sleep apnea all of which are currently stable..  Past Medical History:  Diagnosis Date  . Anemia 06/2015  . Arthritis   . Cardiomyopathy ([place])    a. 10/2013: EF reduced to 35-40% b. 09/2014: EF improved to 55-60%, Grade 1 DD noted.  . Chest pain 06/2015  . CHF (congestive heart failure) ([place])   . CKD (chronic kidney disease), stage IV ([place])   . GERD (gastroesophageal reflux disease)   . Gout   . Hypertension   . Obesity   . OSA (obstructive sleep apnea)    a. on CPAP  . 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 Sleep apnea    not wearing c-pap now, needs new slep study per pt.  . Tuberculosis    back in the 1980's while the pt was in the service.  . Tubular adenoma of colon 10/2015    Past Surgical History:  Procedure Laterality Date  . CHOLECYSTECTOMY    . COLONOSCOPY    . UMBILICAL HERNIA REPAIR    . wisdom teeth extractions       Social History Social History  Substance Use Topics  . Smoking status: Current Every Day Smoker    Packs/day: 0.00    Years: 30.00    Types: Cigarettes  . Smokeless tobacco: Never Used     Comment: only 3 cigarettes per day  . Alcohol use 0.0 oz/week     Comment: rarely    Family History Family History  Problem Relation Age of Onset  . Stroke Mother   . Pneumonia Father     died of Pneumonia x3  . Kidney disease Maternal Grandmother   . Hypertension Maternal Grandmother   .  Healthy Maternal Grandfather   . Healthy Paternal Grandmother   . Healthy Paternal Grandfather   . CAD Neg Hx   . Colon cancer Neg Hx   . Esophageal cancer Neg Hx   . Pancreatic cancer Neg Hx   . Prostate cancer Neg Hx   . Stomach cancer Neg Hx   . Rectal cancer Neg Hx     Allergies  No Known Allergies   Current Outpatient Prescriptions  Medication Sig Dispense Refill  . acetaminophen (TYLENOL) 500 MG tablet Take 1,000 mg by mouth every 6 (six) hours as needed (pain).    Marland Kitchen allopurinol (ZYLOPRIM) 300 MG tablet take 1 tablet by mouth once daily 30 tablet 3  . aspirin EC 81 MG tablet Take 81 mg by mouth daily.    . carvedilol (COREG) 25 MG tablet Take 1 tablet (25 mg total) by mouth 2 (two) times daily. 60 tablet 6  . fenofibrate 54 MG tablet Take 1 tablet (54 mg total) by mouth daily. 30 tablet 11  . furosemide (LASIX) 80 MG tablet Take 1 tablet (80 mg total) by mouth 2 (two) times daily. 60 tablet 1  . isosorbide mononitrate (IMDUR) 30 MG 24 hr tablet Take 2 tablets (60 mg total) by mouth daily. [place]  tablet 1  . metoCLOPramide (REGLAN) 5 MG tablet Take 1 tablet (5 mg total) by mouth 3 (three) times daily before meals. (Patient taking differently: Take 5 mg by mouth QID. ) 90 tablet 3  . nitroGLYCERIN (NITROSTAT) 0.4 MG SL tablet Place 1 tablet (0.4 mg total) under the tongue every 5 (five) minutes as needed for chest pain. 25 tablet 3  . pantoprazole (PROTONIX) 40 MG tablet Take 40 mg by mouth daily.  0  . potassium chloride SA (K-DUR,KLOR-CON) 20 MEQ tablet Take 20 mEq by mouth 2 (two) times daily.    Marland Kitchen dicyclomine (BENTYL) 10 MG capsule Take 1 capsule (10 mg total) by mouth 4 (four) times daily -  before meals and at bedtime. (Patient not taking: Reported on 03/09/2016) 120 capsule 5  . sucralfate (CARAFATE) 1 GM/10ML suspension Take 10 mLs (1 g total) by mouth 4 (four) times daily -  with meals and at bedtime. (Patient not taking: Reported on 03/09/2016) 420 mL 3   No current  facility-administered medications for this visit.     ROS:   General:  No weight loss, Fever, chills  HEENT: No recent headaches, no nasal bleeding, no visual changes, no sore throat  Neurologic: No dizziness, blackouts, seizures. No recent symptoms of stroke or mini- stroke. No recent episodes of slurred speech, or temporary blindness.  Cardiac: No recent episodes of chest pain/pressure, no shortness of breath at rest.  +shortness of breath with exertion.  Denies history of atrial fibrillation or irregular heartbeat  Vascular: No history of rest pain in feet.  No history of claudication.  No history of non-healing ulcer, No history of DVT   Pulmonary: No home oxygen (he is on CPAP), no productive cough, no hemoptysis,  No asthma or wheezing  Musculoskeletal:  [ ]  Arthritis, [ ]  Low back pain,  [ ]  Joint pain  Hematologic:No history of hypercoagulable state.  No history of easy bleeding.  No history of anemia  Gastrointestinal: No hematochezia or melena,  No gastroesophageal reflux, no trouble swallowing  Urinary: [x ] chronic Kidney disease, [ ]  on HD - [ ]  MWF or [ ]  TTHS, [ ]  Burning with urination, [ ]  Frequent urination, [ ]  Difficulty urinating;   Skin: No rashes  Psychological: No history of anxiety,  No history of depression   Physical Examination  Vitals:   03/09/16 1028  BP: 117/65  Pulse: 67  Resp: 20  Temp: 97.1 F (36.2 C)  TempSrc: Oral  SpO2: 100%  Weight: 296 lb 12.8 oz (134.6 kg)  Height: 6\' 2"  (1.88 m)    Body mass index is 38.11 kg/m.  General:  Alert and oriented, no acute distress HEENT: Normal Neck: No bruit or JVD Pulmonary: Clear to auscultation bilaterally Cardiac: Regular Rate and Rhythm without murmur Gastrointestinal: Soft, non-tender, non-distended, no mass, Obese  Skin: No rash Extremity Pulses:  2+ radial, brachial pulses bilaterally Musculoskeletal: No deformity or edema  Neurologic: Upper and lower extremity motor 5/5 and  symmetric  DATA: Patient had a vein mapping and arterial duplex of his upper extremities today. This showed the cephalic vein in the upper arm bilaterally is greater than 5 mm. It is 2-1/2 mm at the wrist bilaterally. Basilic vein is 4-5 mm bilaterally. He had a normal upper extremity arterial duplex exam.   ASSESSMENT: Patient needs long-term hemodialysis access.   PLAN: Left brachiocephalic AV fistula scheduled for 03/25/2016. Risks benefits possible complications and procedure details including but not limited to bleeding infection  non-maturation of the fistula ischemic steal were discussed the patient today. He understands and agrees to proceed.  Ruta Hinds, MD [facility] Office: [phone] Pager: [phone]

## 2016-03-15 ENCOUNTER — Other Ambulatory Visit: Payer: Self-pay | Admitting: Family

## 2016-03-18 ENCOUNTER — Telehealth: Payer: Self-pay | Admitting: Cardiology

## 2016-03-18 NOTE — Telephone Encounter (Signed)
Pt returning call to Dr Jillyn Ledger nurse. Please call at [phone]

## 2016-03-18 NOTE — Telephone Encounter (Signed)
Spoke with Mr Craig Flynn

## 2016-03-19 ENCOUNTER — Telehealth: Payer: Self-pay | Admitting: Cardiology

## 2016-03-19 ENCOUNTER — Other Ambulatory Visit: Payer: Self-pay | Admitting: Family

## 2016-03-19 ENCOUNTER — Encounter (HOSPITAL_COMMUNITY): Payer: Self-pay | Admitting: *Deleted

## 2016-03-19 DIAGNOSIS — E041 Nontoxic single thyroid nodule: Secondary | ICD-10-CM

## 2016-03-19 NOTE — Progress Notes (Signed)
Pt denies any acute cardiopulmonary issues but is under the care of Dr. Percival Spanish, Cardiology. Pt denies having a cardiac cath. Dr. Lissa Hoard, Anesthesia, reviewed pt cardiac history; no new orders given. Pt made aware to stop taking vitamins, fish oil and herbal medications. Do not take any NSAIDs ie: Ibuprofen, Advil, Naproxen, BC and Goody Powder. Pt verbalized understanding of al pre-op instructions.

## 2016-03-19 NOTE — Telephone Encounter (Signed)
Follow Up   Returning call about lab results. Patient PCP won't be in until next week.

## 2016-03-19 NOTE — Telephone Encounter (Signed)
Spoke with Craig Flynn concerning his US Thyroid

## 2016-03-20 ENCOUNTER — Other Ambulatory Visit: Payer: Self-pay | Admitting: Family

## 2016-03-20 DIAGNOSIS — E041 Nontoxic single thyroid nodule: Secondary | ICD-10-CM

## 2016-03-25 ENCOUNTER — Encounter (HOSPITAL_COMMUNITY): Payer: Self-pay | Admitting: *Deleted

## 2016-03-25 ENCOUNTER — Ambulatory Visit (HOSPITAL_COMMUNITY): Payer: BLUE CROSS/BLUE SHIELD | Admitting: Anesthesiology

## 2016-03-25 ENCOUNTER — Ambulatory Visit (HOSPITAL_COMMUNITY)
Admission: RE | Admit: 2016-03-25 | Discharge: 2016-03-25 | Disposition: A | Discharge status: Home / Self Care | Payer: BLUE CROSS/BLUE SHIELD | Source: Ambulatory Visit | Attending: Vascular Surgery | Admitting: Vascular Surgery

## 2016-03-25 ENCOUNTER — Telehealth: Payer: Self-pay | Admitting: Vascular Surgery

## 2016-03-25 ENCOUNTER — Encounter (HOSPITAL_COMMUNITY)
Admission: RE | Disposition: A | Discharge status: Home / Self Care | Payer: Self-pay | Source: Ambulatory Visit | Attending: Vascular Surgery

## 2016-03-25 ENCOUNTER — Other Ambulatory Visit: Payer: Self-pay | Admitting: *Deleted

## 2016-03-25 DIAGNOSIS — E1122 Type 2 diabetes mellitus with diabetic chronic kidney disease: Secondary | ICD-10-CM | POA: Insufficient documentation

## 2016-03-25 DIAGNOSIS — N184 Chronic kidney disease, stage 4 (severe): Secondary | ICD-10-CM | POA: Diagnosis not present

## 2016-03-25 DIAGNOSIS — M109 Gout, unspecified: Secondary | ICD-10-CM | POA: Insufficient documentation

## 2016-03-25 DIAGNOSIS — Z7982 Long term (current) use of aspirin: Secondary | ICD-10-CM | POA: Diagnosis not present

## 2016-03-25 DIAGNOSIS — I509 Heart failure, unspecified: Secondary | ICD-10-CM | POA: Insufficient documentation

## 2016-03-25 DIAGNOSIS — Z79899 Other long term (current) drug therapy: Secondary | ICD-10-CM | POA: Diagnosis not present

## 2016-03-25 DIAGNOSIS — N186 End stage renal disease: Secondary | ICD-10-CM | POA: Diagnosis present

## 2016-03-25 DIAGNOSIS — G4733 Obstructive sleep apnea (adult) (pediatric): Secondary | ICD-10-CM | POA: Diagnosis not present

## 2016-03-25 DIAGNOSIS — K219 Gastro-esophageal reflux disease without esophagitis: Secondary | ICD-10-CM | POA: Insufficient documentation

## 2016-03-25 DIAGNOSIS — F17210 Nicotine dependence, cigarettes, uncomplicated: Secondary | ICD-10-CM | POA: Insufficient documentation

## 2016-03-25 DIAGNOSIS — I130 Hypertensive heart and chronic kidney disease with heart failure and stage 1 through stage 4 chronic kidney disease, or unspecified chronic kidney disease: Secondary | ICD-10-CM | POA: Insufficient documentation

## 2016-03-25 DIAGNOSIS — I429 Cardiomyopathy, unspecified: Secondary | ICD-10-CM | POA: Insufficient documentation

## 2016-03-25 DIAGNOSIS — I129 Hypertensive chronic kidney disease with stage 1 through stage 4 chronic kidney disease, or unspecified chronic kidney disease: Secondary | ICD-10-CM | POA: Diagnosis not present

## 2016-03-25 DIAGNOSIS — Z8611 Personal history of tuberculosis: Secondary | ICD-10-CM | POA: Diagnosis not present

## 2016-03-25 DIAGNOSIS — Z4931 Encounter for adequacy testing for hemodialysis: Secondary | ICD-10-CM

## 2016-03-25 DIAGNOSIS — Z6838 Body mass index (BMI) 38.0-38.9, adult: Secondary | ICD-10-CM | POA: Insufficient documentation

## 2016-03-25 DIAGNOSIS — M1990 Unspecified osteoarthritis, unspecified site: Secondary | ICD-10-CM | POA: Diagnosis not present

## 2016-03-25 DIAGNOSIS — N185 Chronic kidney disease, stage 5: Secondary | ICD-10-CM | POA: Diagnosis not present

## 2016-03-25 HISTORY — DX: Presence of spectacles and contact lenses: Z97.3

## 2016-03-25 HISTORY — PX: AV FISTULA PLACEMENT: SHX1204

## 2016-03-25 LAB — POCT I-STAT 4, (NA,K, GLUC, HGB,HCT)
Glucose, Bld: 97 mg/dL (ref 65–99)
HCT: 35 % — ABNORMAL LOW (ref 39.0–52.0)
Hemoglobin: 11.9 g/dL — ABNORMAL LOW (ref 13.0–17.0)
Potassium: 4 mmol/L (ref 3.5–5.1)
Sodium: 141 mmol/L (ref 135–145)

## 2016-03-25 LAB — GLUCOSE, CAPILLARY: Glucose-Capillary: 102 mg/dL — ABNORMAL HIGH (ref 65–99)

## 2016-03-25 SURGERY — ARTERIOVENOUS (AV) FISTULA CREATION
Anesthesia: General | Site: Arm Upper | Laterality: Left

## 2016-03-25 MED ORDER — CHLORHEXIDINE GLUCONATE CLOTH 2 % EX PADS: 6.0000 | MEDICATED_PAD | Freq: Once | CUTANEOUS | Status: DC

## 2016-03-25 MED ORDER — FENTANYL CITRATE (PF) 100 MCG/2ML IJ SOLN
INTRAMUSCULAR | Status: DC | PRN
  Administered 2016-03-25: 25 ug via INTRAVENOUS
  Administered 2016-03-25: 50 ug via INTRAVENOUS
  Administered 2016-03-25 (×3): 25 ug via INTRAVENOUS
  Administered 2016-03-25 (×2): 50 ug via INTRAVENOUS

## 2016-03-25 MED ORDER — PROPOFOL 10 MG/ML IV BOLUS
INTRAVENOUS | Status: AC
Start: 2016-03-25 — End: 2016-03-25
  Filled 2016-03-25: qty 20

## 2016-03-25 MED ORDER — BUPIVACAINE HCL (PF) 0.25 % IJ SOLN
INTRAMUSCULAR | Status: DC | PRN
  Administered 2016-03-25: 30 mL

## 2016-03-25 MED ORDER — ONDANSETRON HCL 4 MG/2ML IJ SOLN
INTRAMUSCULAR | Status: DC | PRN
  Administered 2016-03-25: 4 mg via INTRAVENOUS

## 2016-03-25 MED ORDER — MIDAZOLAM HCL 5 MG/5ML IJ SOLN
INTRAMUSCULAR | Status: DC | PRN
  Administered 2016-03-25: 2 mg via INTRAVENOUS

## 2016-03-25 MED ORDER — HYDROMORPHONE HCL 1 MG/ML IJ SOLN: 0.2500 mg | INTRAMUSCULAR | Status: DC | PRN

## 2016-03-25 MED ORDER — CEFUROXIME SODIUM 1.5 G IJ SOLR
INTRAMUSCULAR | Status: AC
  Filled 2016-03-25: qty 1.5

## 2016-03-25 MED ORDER — MIDAZOLAM HCL 2 MG/2ML IJ SOLN
INTRAMUSCULAR | Status: AC
  Filled 2016-03-25: qty 2

## 2016-03-25 MED ORDER — SODIUM CHLORIDE 0.9 % IV SOLN
INTRAVENOUS | Status: DC | PRN
  Administered 2016-03-25: 500 mL

## 2016-03-25 MED ORDER — PHENYLEPHRINE HCL 10 MG/ML IJ SOLN
INTRAVENOUS | Status: DC | PRN
  Administered 2016-03-25: 50 ug/min via INTRAVENOUS

## 2016-03-25 MED ORDER — FENTANYL CITRATE (PF) 250 MCG/5ML IJ SOLN
INTRAMUSCULAR | Status: AC
  Filled 2016-03-25: qty 5

## 2016-03-25 MED ORDER — LIDOCAINE HCL (PF) 1 % IJ SOLN
INTRAMUSCULAR | Status: AC
  Filled 2016-03-25: qty 30

## 2016-03-25 MED ORDER — LIDOCAINE HCL 1 % IJ SOLN
INTRAMUSCULAR | Status: DC | PRN
  Administered 2016-03-25: 30 mL

## 2016-03-25 MED ORDER — PROPOFOL 10 MG/ML IV BOLUS
INTRAVENOUS | Status: AC
  Filled 2016-03-25: qty 20

## 2016-03-25 MED ORDER — DEXTROSE 5 % IV SOLN
1.5000 g | INTRAVENOUS | Status: AC
  Administered 2016-03-25: 1.5 g via INTRAVENOUS

## 2016-03-25 MED ORDER — OXYCODONE-ACETAMINOPHEN 5-325 MG PO TABS: 1.0000 | ORAL_TABLET | Freq: Four times a day (QID) | ORAL | 0 refills | Status: DC | PRN

## 2016-03-25 MED ORDER — PROPOFOL 10 MG/ML IV BOLUS
INTRAVENOUS | Status: DC | PRN
  Administered 2016-03-25: 200 mg via INTRAVENOUS
  Administered 2016-03-25: 40 mg via INTRAVENOUS

## 2016-03-25 MED ORDER — SODIUM CHLORIDE 0.9 % IV SOLN
INTRAVENOUS | Status: DC
  Administered 2016-03-25 (×2): via INTRAVENOUS

## 2016-03-25 MED ORDER — SODIUM CHLORIDE 0.9 % IV SOLN
INTRAVENOUS | Status: DC
  Administered 2016-03-25: 10:00:00 via INTRAVENOUS

## 2016-03-25 MED ORDER — LIDOCAINE HCL (CARDIAC) 20 MG/ML IV SOLN
INTRAVENOUS | Status: DC | PRN
  Administered 2016-03-25: 100 mg via INTRAVENOUS

## 2016-03-25 MED ORDER — BUPIVACAINE HCL (PF) 0.25 % IJ SOLN
INTRAMUSCULAR | Status: AC
  Filled 2016-03-25: qty 30

## 2016-03-25 MED ORDER — 0.9 % SODIUM CHLORIDE (POUR BTL) OPTIME
TOPICAL | Status: DC | PRN
  Administered 2016-03-25: 1000 mL

## 2016-03-25 MED ORDER — PROMETHAZINE HCL 25 MG/ML IJ SOLN: 6.2500 mg | INTRAMUSCULAR | Status: DC | PRN

## 2016-03-25 MED ORDER — PHENYLEPHRINE HCL 10 MG/ML IJ SOLN
INTRAMUSCULAR | Status: DC | PRN
  Administered 2016-03-25 (×3): 80 ug via INTRAVENOUS

## 2016-03-25 MED ORDER — HEPARIN SODIUM (PORCINE) 1000 UNIT/ML IJ SOLN
INTRAMUSCULAR | Status: DC | PRN
  Administered 2016-03-25: 7000 [IU] via INTRAVENOUS

## 2016-03-25 MED ORDER — SUCCINYLCHOLINE CHLORIDE 20 MG/ML IJ SOLN
INTRAMUSCULAR | Status: DC | PRN
  Administered 2016-03-25: 120 mg via INTRAVENOUS

## 2016-03-25 SURGICAL SUPPLY — 35 items
ADH SKN CLS APL DERMABOND .7 (GAUZE/BANDAGES/DRESSINGS) ×1
ARMBAND PINK RESTRICT EXTREMIT (MISCELLANEOUS) ×4 IMPLANT
CANISTER SUCTION 2500CC (MISCELLANEOUS) ×2 IMPLANT
CANNULA VESSEL 3MM 2 BLNT TIP (CANNULA) ×2 IMPLANT
CLIP TI MEDIUM 6 (CLIP) ×2 IMPLANT
CLIP TI WIDE RED SMALL 6 (CLIP) ×2 IMPLANT
COVER PROBE W GEL 5X96 (DRAPES) IMPLANT
DECANTER SPIKE VIAL GLASS SM (MISCELLANEOUS) ×2 IMPLANT
DERMABOND ADVANCED (GAUZE/BANDAGES/DRESSINGS) ×1
DERMABOND ADVANCED .7 DNX12 (GAUZE/BANDAGES/DRESSINGS) ×1 IMPLANT
DRAIN PENROSE 1/4X12 LTX STRL (WOUND CARE) ×2 IMPLANT
ELECT REM PT RETURN 9FT ADLT (ELECTROSURGICAL) ×2
ELECTRODE REM PT RTRN 9FT ADLT (ELECTROSURGICAL) ×1 IMPLANT
GLOVE BIO SURGEON STRL SZ 6.5 (GLOVE) ×1 IMPLANT
GLOVE BIO SURGEON STRL SZ7.5 (GLOVE) ×2 IMPLANT
GLOVE BIOGEL PI IND STRL 6.5 (GLOVE) IMPLANT
GLOVE BIOGEL PI INDICATOR 6.5 (GLOVE) ×7
GLOVE ECLIPSE 6.5 STRL STRAW (GLOVE) ×2 IMPLANT
GOWN STRL REUS W/ TWL LRG LVL3 (GOWN DISPOSABLE) ×3 IMPLANT
GOWN STRL REUS W/TWL LRG LVL3 (GOWN DISPOSABLE) ×6
KIT BASIN OR (CUSTOM PROCEDURE TRAY) ×2 IMPLANT
KIT ROOM TURNOVER OR (KITS) ×2 IMPLANT
LOOP VESSEL MINI RED (MISCELLANEOUS) IMPLANT
NS IRRIG 1000ML POUR BTL (IV SOLUTION) ×2 IMPLANT
PACK CV ACCESS (CUSTOM PROCEDURE TRAY) ×2 IMPLANT
PAD ARMBOARD 7.5X6 YLW CONV (MISCELLANEOUS) ×4 IMPLANT
SPONGE SURGIFOAM ABS GEL 100 (HEMOSTASIS) IMPLANT
SUT PROLENE 7 0 BV 1 (SUTURE) ×2 IMPLANT
SUT SILK 3 0 (SUTURE) ×2
SUT SILK 3-0 18XBRD TIE 12 (SUTURE) IMPLANT
SUT VIC AB 3-0 SH 27 (SUTURE) ×2
SUT VIC AB 3-0 SH 27X BRD (SUTURE) ×1 IMPLANT
SUT VICRYL 4-0 PS2 18IN ABS (SUTURE) ×2 IMPLANT
UNDERPAD 30X30 (UNDERPADS AND DIAPERS) ×2 IMPLANT
WATER STERILE IRR 1000ML POUR (IV SOLUTION) ×2 IMPLANT

## 2016-03-25 NOTE — Anesthesia Preprocedure Evaluation (Addendum)
Anesthesia Evaluation  Patient identified by MRN, date of birth, ID band Patient awake    Reviewed: Allergy & Precautions, NPO status , Patient's Chart, lab work & pertinent test results  History of Anesthesia Complications Negative for: history of anesthetic complications  Airway Mallampati: I  TM Distance: >3 FB Neck ROM: Full    Dental  (+) Teeth Intact, Dental Advisory Given   Pulmonary sleep apnea , Current Smoker,    breath sounds clear to auscultation       Cardiovascular hypertension, +CHF   Rhythm:Regular Rate:Normal     Neuro/Psych    GI/Hepatic GERD  ,  Endo/Other  diabetesMorbid obesity  Renal/GU ESRFRenal disease     Musculoskeletal  (+) Arthritis ,   Abdominal (+) + obese,   Peds  Hematology   Anesthesia Other Findings   Reproductive/Obstetrics                            Anesthesia Physical Anesthesia Plan  ASA: IV  Anesthesia Plan: General   Post-op Pain Management:    Induction: Intravenous  Airway Management Planned: Oral ETT  Additional Equipment:   Intra-op Plan:   Post-operative Plan: Extubation in OR  Informed Consent: I have reviewed the patients History and Physical, chart, labs and discussed the procedure including the risks, benefits and alternatives for the proposed anesthesia with the patient or authorized representative who has indicated his/her understanding and acceptance.     Plan Discussed with: CRNA  Anesthesia Plan Comments:        Anesthesia Quick Evaluation

## 2016-03-25 NOTE — Op Note (Signed)
Procedure: Left Brachial Cephalic AV fistula  Preop: ESRD  Postop: ESRD  Anesthesia: General  Assistant: Gaye Alken RNFA  Findings:  2.5 -3 mm cephalic vein  Procedure: After obtaining informed consent, the patient was taken to the operating room.  After induction of general anesthesia, the left upper extremity was prepped and draped in usual sterile fashion.  A transverse incision was then made near the antecubital crease the left arm. The incision was carried into the subcutaneous tissues down to level of the cephalic vein. The cephalic vein was approximately 2.5-3.0 mm in diameter. It was of reasonable quality but did have some surrounding adhesions and scar tissue from prior needlesticks. This was dissected free circumferentially and small side branches ligated and divided between silk ties or clips. There was a long span between the vein and the artery so I had to mobilize about 5 cm of the vein raising skin flaps proximally and distally so the vein would reach.  Next the brachial artery was dissected free in the medial portion of the incision. The artery was  3-4 mm in diameter. The vessel loops were placed proximal and distal to the planned site of arteriotomy. The patient was given 7000 units of intravenous heparin. After appropriate circulation time, the vessel loops were used to control the artery. A longitudinal opening was made in the brachial artery.  The vein was ligated distally with a 2-0 silk tie. The vein was controlled proximally with a fine bulldog clamp. The vein was then swung over to the artery and sewn end of vein to side of artery using a running 7-0 Prolene suture. Just prior to completion of the anastomosis, everything was fore bled back bled and thoroughly flushed. The anastomosis was secured, vessel loops released, and there was a palpable thrill in the fistula immediately. About 20 cc of local anesthesia mixture of 0.25 % marcaine with 1% lidocaine was infiltrated in  the skin.  After hemostasis was obtained, the subcutaneous tissues were reapproximated using a running 3-0 Vicryl suture. The skin was then closed with a 4 0 Vicryl subcuticular stitch. Dermabond was applied to the skin incision.  The patient had a palpable radial pulse at the end of the case.  Ruta Hinds, MD [facility] Office: [phone] Pager: [phone]

## 2016-03-25 NOTE — Interval H&P Note (Signed)
History and Physical Interval Note:  03/25/2016 11:37 AM  Craig Flynn  has presented today for surgery, with the diagnosis of Stage V Chronic Kidney Disease N18.5  The various methods of treatment have been discussed with the patient and family. After consideration of risks, benefits and other options for treatment, the patient has consented to  Procedure(s): BRACHIOCEPHALIC ARTERIOVENOUS (AV) FISTULA CREATION (Left) as a surgical intervention .  The patient's history has been reviewed, patient examined, no change in status, stable for surgery.  I have reviewed the patient's chart and labs.  Questions were answered to the patient's satisfaction.     Ruta Hinds

## 2016-03-25 NOTE — H&P (View-Only) (Signed)
[facility] HISTORY AND PHYSICAL   History of Present Illness:  Patient is a 56 y.o. year old male who presents for placement of a permanent hemodialysis access. The patient is right handed.  The patient is not currently on hemodialysis.  The cause of renal failure is thought to be secondary to hypertension.  His primary nephrologist is Dr. Lorrene Reid. Other chronic medical problems include anemia, arthritis, cardiomyopathy, congestive failure, reflux, gout, hypertension, sleep apnea all of which are currently stable..  Past Medical History:  Diagnosis Date  . Anemia 06/2015  . Arthritis   . Cardiomyopathy ([place])    a. 10/2013: EF reduced to 35-40% b. 09/2014: EF improved to 55-60%, Grade 1 DD noted.  . Chest pain 06/2015  . CHF (congestive heart failure) ([place])   . CKD (chronic kidney disease), stage IV ([place])   . GERD (gastroesophageal reflux disease)   . Gout   . Hypertension   . Obesity   . OSA (obstructive sleep apnea)    a. on CPAP  . 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 Sleep apnea    not wearing c-pap now, needs new slep study per pt.  . Tuberculosis    back in the 1980's while the pt was in the service.  . Tubular adenoma of colon 10/2015    Past Surgical History:  Procedure Laterality Date  . CHOLECYSTECTOMY    . COLONOSCOPY    . UMBILICAL HERNIA REPAIR    . wisdom teeth extractions       Social History Social History  Substance Use Topics  . Smoking status: Current Every Day Smoker    Packs/day: 0.00    Years: 30.00    Types: Cigarettes  . Smokeless tobacco: Never Used     Comment: only 3 cigarettes per day  . Alcohol use 0.0 oz/week     Comment: rarely    Family History Family History  Problem Relation Age of Onset  . Stroke Mother   . Pneumonia Father     died of Pneumonia x3  . Kidney disease Maternal Grandmother   . Hypertension Maternal Grandmother   .  Healthy Maternal Grandfather   . Healthy Paternal Grandmother   . Healthy Paternal Grandfather   . CAD Neg Hx   . Colon cancer Neg Hx   . Esophageal cancer Neg Hx   . Pancreatic cancer Neg Hx   . Prostate cancer Neg Hx   . Stomach cancer Neg Hx   . Rectal cancer Neg Hx     Allergies  No Known Allergies   Current Outpatient Prescriptions  Medication Sig Dispense Refill  . acetaminophen (TYLENOL) 500 MG tablet Take 1,000 mg by mouth every 6 (six) hours as needed (pain).    Marland Kitchen allopurinol (ZYLOPRIM) 300 MG tablet take 1 tablet by mouth once daily 30 tablet 3  . aspirin EC 81 MG tablet Take 81 mg by mouth daily.    . carvedilol (COREG) 25 MG tablet Take 1 tablet (25 mg total) by mouth 2 (two) times daily. 60 tablet 6  . fenofibrate 54 MG tablet Take 1 tablet (54 mg total) by mouth daily. 30 tablet 11  . furosemide (LASIX) 80 MG tablet Take 1 tablet (80 mg total) by mouth 2 (two) times daily. 60 tablet 1  . isosorbide mononitrate (IMDUR) 30 MG 24 hr tablet Take 2 tablets (60 mg total) by mouth daily. [place]  tablet 1  . metoCLOPramide (REGLAN) 5 MG tablet Take 1 tablet (5 mg total) by mouth 3 (three) times daily before meals. (Patient taking differently: Take 5 mg by mouth QID. ) 90 tablet 3  . nitroGLYCERIN (NITROSTAT) 0.4 MG SL tablet Place 1 tablet (0.4 mg total) under the tongue every 5 (five) minutes as needed for chest pain. 25 tablet 3  . pantoprazole (PROTONIX) 40 MG tablet Take 40 mg by mouth daily.  0  . potassium chloride SA (K-DUR,KLOR-CON) 20 MEQ tablet Take 20 mEq by mouth 2 (two) times daily.    Marland Kitchen dicyclomine (BENTYL) 10 MG capsule Take 1 capsule (10 mg total) by mouth 4 (four) times daily -  before meals and at bedtime. (Patient not taking: Reported on 03/09/2016) 120 capsule 5  . sucralfate (CARAFATE) 1 GM/10ML suspension Take 10 mLs (1 g total) by mouth 4 (four) times daily -  with meals and at bedtime. (Patient not taking: Reported on 03/09/2016) 420 mL 3   No current  facility-administered medications for this visit.     ROS:   General:  No weight loss, Fever, chills  HEENT: No recent headaches, no nasal bleeding, no visual changes, no sore throat  Neurologic: No dizziness, blackouts, seizures. No recent symptoms of stroke or mini- stroke. No recent episodes of slurred speech, or temporary blindness.  Cardiac: No recent episodes of chest pain/pressure, no shortness of breath at rest.  +shortness of breath with exertion.  Denies history of atrial fibrillation or irregular heartbeat  Vascular: No history of rest pain in feet.  No history of claudication.  No history of non-healing ulcer, No history of DVT   Pulmonary: No home oxygen (he is on CPAP), no productive cough, no hemoptysis,  No asthma or wheezing  Musculoskeletal:  [ ]  Arthritis, [ ]  Low back pain,  [ ]  Joint pain  Hematologic:No history of hypercoagulable state.  No history of easy bleeding.  No history of anemia  Gastrointestinal: No hematochezia or melena,  No gastroesophageal reflux, no trouble swallowing  Urinary: [x ] chronic Kidney disease, [ ]  on HD - [ ]  MWF or [ ]  TTHS, [ ]  Burning with urination, [ ]  Frequent urination, [ ]  Difficulty urinating;   Skin: No rashes  Psychological: No history of anxiety,  No history of depression   Physical Examination  Vitals:   03/09/16 1028  BP: 117/65  Pulse: 67  Resp: 20  Temp: 97.1 F (36.2 C)  TempSrc: Oral  SpO2: 100%  Weight: 296 lb 12.8 oz (134.6 kg)  Height: 6\' 2"  (1.88 m)    Body mass index is 38.11 kg/m.  General:  Alert and oriented, no acute distress HEENT: Normal Neck: No bruit or JVD Pulmonary: Clear to auscultation bilaterally Cardiac: Regular Rate and Rhythm without murmur Gastrointestinal: Soft, non-tender, non-distended, no mass, Obese  Skin: No rash Extremity Pulses:  2+ radial, brachial pulses bilaterally Musculoskeletal: No deformity or edema  Neurologic: Upper and lower extremity motor 5/5 and  symmetric  DATA: Patient had a vein mapping and arterial duplex of his upper extremities today. This showed the cephalic vein in the upper arm bilaterally is greater than 5 mm. It is 2-1/2 mm at the wrist bilaterally. Basilic vein is 4-5 mm bilaterally. He had a normal upper extremity arterial duplex exam.   ASSESSMENT: Patient needs long-term hemodialysis access.   PLAN: Left brachiocephalic AV fistula scheduled for 03/25/2016. Risks benefits possible complications and procedure details including but not limited to bleeding infection  non-maturation of the fistula ischemic steal were discussed the patient today. He understands and agrees to proceed.  Ruta Hinds, MD [facility] Office: [phone] Pager: [phone]

## 2016-03-25 NOTE — Telephone Encounter (Signed)
-----   Message from Mena Goes, RN sent at 03/25/2016 12:37 PM EST ----- Regarding: schedule   ----- Message ----- From: Alvia Grove, PA-C Sent: 03/25/2016  12:25 PM To: Vvs Charge Pool  S/p left brachial-cephalic AVF 07/28/29  F/u with Dr. Oneida Alar in 4-6 weeks with duplex.  Thanks Maudie Mercury

## 2016-03-25 NOTE — Transfer of Care (Signed)
Immediate Anesthesia Transfer of Care Note  Patient: Craig Flynn  Procedure(s) Performed: Procedure(s): Creation of Left Arm ARTERIOVENOUS (AV) FISTULA (Left)  Patient Location: PACU  Anesthesia Type:General  Level of Consciousness: awake, alert  and oriented  Airway & Oxygen Therapy: Patient Spontanous Breathing and Patient connected to nasal cannula oxygen  Post-op Assessment: Report given to RN and Post -op Vital signs reviewed and stable  Post vital signs: Reviewed and stable  Last Vitals:  Vitals:   03/25/16 1419 03/25/16 1422  BP:  133/67  Pulse: 77   Resp:  19  Temp: 36.2 C     Last Pain:  Vitals:   03/25/16 0954  TempSrc: Oral      Patients Stated Pain Goal: 3 (95/32/02 3343)  Complications: No apparent anesthesia complications

## 2016-03-25 NOTE — Anesthesia Procedure Notes (Signed)
Procedure Name: Intubation Date/Time: 03/25/2016 12:18 PM Performed by: Clearnce Sorrel Pre-anesthesia Checklist: Patient identified, Emergency Drugs available, Suction available, Patient being monitored and Timeout performed Patient Re-evaluated:Patient Re-evaluated prior to inductionOxygen Delivery Method: Circle system utilized Preoxygenation: Pre-oxygenation with 100% oxygen Intubation Type: IV induction Ventilation: Mask ventilation without difficulty Laryngoscope Size: Mac, 4 and Glidescope Grade View: Grade III Tube type: Oral Tube size: 7.5 mm Number of attempts: 2 Airway Equipment and Method: Stylet Placement Confirmation: ETT inserted through vocal cords under direct vision,  positive ETCO2 and breath sounds checked- equal and bilateral Secured at: 23 cm Tube secured with: Tape Dental Injury: Teeth and Oropharynx as per pre-operative assessment

## 2016-03-25 NOTE — Telephone Encounter (Signed)
LVM on home # for appt 2/15 w/ duplex  Mailed letter

## 2016-03-25 NOTE — Anesthesia Postprocedure Evaluation (Signed)
Anesthesia Post Note  Patient: Craig Flynn  Procedure(s) Performed: Procedure(s) (LRB): Creation of Left Arm ARTERIOVENOUS (AV) FISTULA (Left)  Patient location during evaluation: PACU Anesthesia Type: General Level of consciousness: awake and alert Pain management: pain level controlled Vital Signs Assessment: post-procedure vital signs reviewed and stable Respiratory status: spontaneous breathing, nonlabored ventilation, respiratory function stable and patient connected to nasal cannula oxygen Cardiovascular status: blood pressure returned to baseline and stable Postop Assessment: no signs of nausea or vomiting Anesthetic complications: no       Last Vitals:  Vitals:   03/25/16 1510 03/25/16 1512  BP:  (!) 123/92  Pulse:  73  Resp:  14  Temp: 36.1 C 36.7 C    Last Pain:  Vitals:   03/25/16 0954  TempSrc: Oral                 [name],[name]

## 2016-03-26 ENCOUNTER — Encounter (HOSPITAL_COMMUNITY): Payer: Self-pay | Admitting: Vascular Surgery

## 2016-03-27 DIAGNOSIS — N2581 Secondary hyperparathyroidism of renal origin: Secondary | ICD-10-CM | POA: Diagnosis not present

## 2016-03-27 DIAGNOSIS — D649 Anemia, unspecified: Secondary | ICD-10-CM | POA: Diagnosis not present

## 2016-03-27 DIAGNOSIS — N185 Chronic kidney disease, stage 5: Secondary | ICD-10-CM | POA: Diagnosis not present

## 2016-03-31 ENCOUNTER — Telehealth: Payer: Self-pay | Admitting: *Deleted

## 2016-03-31 NOTE — Telephone Encounter (Signed)
Patient called requesting a refill for oxycodone.  I explained that this is normally not refilled for the surgery. I directed the patient to elevate his arm on a pillow, move his fingers quickly as if playing the piano and to gently open and close his hand.  He is c/o his arm being stiff and hard to bend.  I instructed patient to bend his arm slowly back and forth. Patient is to call if symptoms should worsen.

## 2016-04-02 ENCOUNTER — Other Ambulatory Visit (HOSPITAL_COMMUNITY)
Admission: RE | Admit: 2016-04-02 | Discharge: 2016-04-02 | Disposition: A | Discharge status: Home / Self Care | Payer: BLUE CROSS/BLUE SHIELD | Source: Ambulatory Visit | Attending: Physician Assistant | Admitting: Physician Assistant

## 2016-04-02 ENCOUNTER — Ambulatory Visit
Admission: RE | Admit: 2016-04-02 | Discharge: 2016-04-02 | Disposition: A | Discharge status: Home / Self Care | Payer: BLUE CROSS/BLUE SHIELD | Source: Ambulatory Visit | Attending: Family | Admitting: Family

## 2016-04-02 DIAGNOSIS — E041 Nontoxic single thyroid nodule: Secondary | ICD-10-CM

## 2016-04-02 DIAGNOSIS — E042 Nontoxic multinodular goiter: Secondary | ICD-10-CM | POA: Insufficient documentation

## 2016-04-02 NOTE — Procedures (Signed)
PROCEDURE SUMMARY:  Using direct ultrasound guidance, 3 passes were made using 25 g needles into each of the nodules within the right lobe and the left lobe of the thyroid.   Ultrasound was used to confirm needle placements on all occasions.   Specimens were sent to Pathology for analysis.  Judie Grieve Malyk Girouard PA-C 04/02/2016 4:48 PM

## 2016-04-04 ENCOUNTER — Encounter: Payer: Self-pay | Admitting: Family

## 2016-04-10 ENCOUNTER — Ambulatory Visit: Payer: BLUE CROSS/BLUE SHIELD | Admitting: Gastroenterology

## 2016-04-12 ENCOUNTER — Other Ambulatory Visit: Payer: Self-pay | Admitting: Cardiology

## 2016-04-15 ENCOUNTER — Other Ambulatory Visit: Payer: Self-pay

## 2016-04-15 DIAGNOSIS — E041 Nontoxic single thyroid nodule: Secondary | ICD-10-CM

## 2016-04-20 DIAGNOSIS — E041 Nontoxic single thyroid nodule: Secondary | ICD-10-CM | POA: Diagnosis not present

## 2016-04-29 DIAGNOSIS — I10 Essential (primary) hypertension: Secondary | ICD-10-CM | POA: Diagnosis not present

## 2016-04-29 DIAGNOSIS — N2581 Secondary hyperparathyroidism of renal origin: Secondary | ICD-10-CM | POA: Diagnosis not present

## 2016-04-29 DIAGNOSIS — D631 Anemia in chronic kidney disease: Secondary | ICD-10-CM | POA: Diagnosis not present

## 2016-04-29 DIAGNOSIS — N185 Chronic kidney disease, stage 5: Secondary | ICD-10-CM | POA: Diagnosis not present

## 2016-04-30 ENCOUNTER — Encounter: Payer: Self-pay | Admitting: Vascular Surgery

## 2016-05-07 ENCOUNTER — Encounter: Payer: BLUE CROSS/BLUE SHIELD | Admitting: Vascular Surgery

## 2016-05-07 ENCOUNTER — Encounter (HOSPITAL_COMMUNITY): Payer: BLUE CROSS/BLUE SHIELD

## 2016-05-22 ENCOUNTER — Ambulatory Visit (INDEPENDENT_AMBULATORY_CARE_PROVIDER_SITE_OTHER): Payer: BLUE CROSS/BLUE SHIELD | Admitting: Family

## 2016-05-22 ENCOUNTER — Encounter: Payer: Self-pay | Admitting: Family

## 2016-05-22 ENCOUNTER — Encounter: Payer: Self-pay | Admitting: Vascular Surgery

## 2016-05-22 VITALS — BP 110/60 | HR 85 | Temp 98.2°F | Resp 18 | Ht 74.0 in | Wt 289.0 lb

## 2016-05-22 DIAGNOSIS — R079 Chest pain, unspecified: Secondary | ICD-10-CM | POA: Diagnosis not present

## 2016-05-22 MED ORDER — GABAPENTIN 100 MG PO CAPS: 100.0000 mg | ORAL_CAPSULE | Freq: Every day | ORAL | 1 refills | Status: DC

## 2016-05-22 NOTE — Patient Instructions (Addendum)
Thank you for choosing [facility].  SUMMARY AND INSTRUCTIONS:  Medication:  Please start the gabapentin.   Your prescription(s) have been submitted to your pharmacy or been printed and provided for you. Please take as directed and contact our office if you believe you are having problem(s) with the medication(s) or have any questions.    Follow up:  If your symptoms worsen or fail to improve, please contact our office for further instruction, or in case of emergency go directly to the emergency room at the closest medical facility.

## 2016-05-22 NOTE — Assessment & Plan Note (Signed)
Chest pain of undetermined origin with previous negative work up by cardiology, orthopedics, and neurology. No significant findings on exam and unable to re-create symptoms on exam. Consider neurological pain. Trial gabapentin. Dose reduced secondary to kidney function with calculate creatinine clearance of 18. Follow up pending trial of gabapentin or sooner if needed.

## 2016-05-22 NOTE — Assessment & Plan Note (Deleted)
Chest pain of undetermined origin with previous negative work up by cardiology, orthopedics, and neurology. No significant findings on exam and unable to re-create symptoms on exam. Consider neurological pain. Trial gabapentin. Dose reduced secondary to kidney function with calculate creatinine clearance of 18. Follow up pending trial of gabapentin or sooner if needed.

## 2016-05-22 NOTE — Progress Notes (Signed)
Subjective:    Patient ID: Craig Flynn, male    DOB: 1960-05-18, 56 y.o.   MRN: [id number]  Chief Complaint  Patient presents with  . Chest discomfort    chest discomfort that has not gotten any better or worse    HPI:  Craig Flynn is a 56 y.o. male who  has a past medical history of Anemia (06/2015); Arthritis; Cardiomyopathy ([place]); Chest pain (06/2015); CHF (congestive heart failure) ([place]); CKD (chronic kidney disease), stage IV ([place]); GERD (gastroesophageal reflux disease); Gout; Hypertension; Obesity; OSA (obstructive sleep apnea); Other specified disorder of intestines; Sleep apnea; Tuberculosis; Tubular adenoma of colon (10/2015); and Wears glasses. and presents today for a follow up office visit.  Continues to experience the associated symptoms of chest discomfort located in the center of his chest that has been going on for a couple of years. Has had multiple work ups by cardiology and has been seen by orthopedics. Pain is described as sharp and radiates across his chest and waxes and wanes for about 15-20 minutes. Time is the only factor that makes it go away. Frequency occurs on a daily basis.   Allergies  Allergen Reactions  . No Known Allergies       Outpatient Medications Prior to Visit  Medication Sig Dispense Refill  . acetaminophen (TYLENOL) 500 MG tablet Take 1,000 mg by mouth every 6 (six) hours as needed (pain).    Marland Kitchen allopurinol (ZYLOPRIM) 300 MG tablet take 1 tablet by mouth once daily 30 tablet 3  . aspirin EC 81 MG tablet Take 81 mg by mouth daily.    . carvedilol (COREG) 25 MG tablet take 1 tablet by mouth twice a day 60 tablet 6  . fenofibrate 54 MG tablet Take 1 tablet (54 mg total) by mouth daily. 30 tablet 11  . furosemide (LASIX) 80 MG tablet Take 1 tablet (80 mg total) by mouth 2 (two) times daily. 60 tablet 1  . isosorbide mononitrate (IMDUR) 30 MG 24 hr tablet Take 2 tablets (60 mg total) by mouth daily. 30 tablet 1  . nitroGLYCERIN (NITROSTAT) 0.4  MG SL tablet Place 1 tablet (0.4 mg total) under the tongue every 5 (five) minutes as needed for chest pain. 25 tablet 3  . oxyCODONE-acetaminophen (PERCOCET/ROXICET) 5-325 MG tablet Take 1 tablet by mouth every 6 (six) hours as needed. 6 tablet 0  . pantoprazole (PROTONIX) 40 MG tablet Take 40 mg by mouth daily.  0  . potassium chloride SA (K-DUR,KLOR-CON) 20 MEQ tablet Take 20 mEq by mouth 2 (two) times daily.     No facility-administered medications prior to visit.       Review of Systems  Constitutional: Negative for chills and fever.  Respiratory: Positive for shortness of breath. Negative for chest tightness and wheezing.   Cardiovascular: Positive for chest pain. Negative for palpitations and leg swelling.      Objective:    BP 110/60 (BP Location: Left Arm, Patient Position: Sitting, Cuff Size: Large)   Pulse 85   Temp 98.2 F (36.8 C) (Oral)   Resp 18   Ht 6\' 2"  (1.88 m)   Wt 289 lb (131.1 kg)   SpO2 95%   BMI 37.11 kg/m  Nursing note and vital signs reviewed.  Physical Exam  Constitutional: He is oriented to person, place, and time. He appears well-developed and well-nourished. No distress.  Cardiovascular: Normal rate, regular rhythm, normal heart sounds and intact distal pulses.   Pulmonary/Chest: Effort normal and breath  sounds normal. No respiratory distress. He has no wheezes. He has no rales. He exhibits no tenderness.  No obvious deformity, discoloration or edema. No palpable tenderness able to be elicited. Unable to recreate symptoms.   Neurological: He is alert and oriented to person, place, and time.  Skin: Skin is warm and dry.  Psychiatric: He has a normal mood and affect. His behavior is normal. Judgment and thought content normal.       Assessment & Plan:   Problem List Items Addressed This Visit      Other   Pain in the chest - Primary    Chest pain of undetermined origin with previous negative work up by cardiology, orthopedics, and neurology.  No significant findings on exam and unable to re-create symptoms on exam. Consider neurological pain. Trial gabapentin. Dose reduced secondary to kidney function with calculate creatinine clearance of 18. Follow up pending trial of gabapentin or sooner if needed.           I am having Craig Flynn start on gabapentin. I am also having him maintain his furosemide, fenofibrate, potassium chloride SA, acetaminophen, aspirin EC, nitroGLYCERIN, pantoprazole, isosorbide mononitrate, allopurinol, oxyCODONE-acetaminophen, and carvedilol.   Meds ordered this encounter  Medications  . gabapentin (NEURONTIN) 100 MG capsule    Sig: Take 1 capsule (100 mg total) by mouth at bedtime.    Dispense:  30 capsule    Refill:  1    Order Specific Question:   Supervising Provider    Answer:   Pricilla Holm A [[id number]]     Follow-up: Return if symptoms worsen or fail to improve.  Mauricio Po, FNP

## 2016-05-28 DIAGNOSIS — D631 Anemia in chronic kidney disease: Secondary | ICD-10-CM | POA: Diagnosis not present

## 2016-05-28 DIAGNOSIS — N2581 Secondary hyperparathyroidism of renal origin: Secondary | ICD-10-CM | POA: Diagnosis not present

## 2016-06-01 ENCOUNTER — Ambulatory Visit (HOSPITAL_COMMUNITY)
Admission: RE | Admit: 2016-06-01 | Discharge: 2016-06-01 | Disposition: A | Discharge status: Home / Self Care | Payer: BLUE CROSS/BLUE SHIELD | Source: Ambulatory Visit | Attending: Vascular Surgery | Admitting: Vascular Surgery

## 2016-06-01 DIAGNOSIS — Z4931 Encounter for adequacy testing for hemodialysis: Secondary | ICD-10-CM

## 2016-06-01 DIAGNOSIS — N186 End stage renal disease: Secondary | ICD-10-CM | POA: Insufficient documentation

## 2016-06-04 ENCOUNTER — Ambulatory Visit (INDEPENDENT_AMBULATORY_CARE_PROVIDER_SITE_OTHER): Payer: Self-pay | Admitting: Vascular Surgery

## 2016-06-04 ENCOUNTER — Encounter: Payer: Self-pay | Admitting: Vascular Surgery

## 2016-06-04 VITALS — BP 105/59 | HR 92 | Temp 98.8°F | Resp 18 | Ht 74.0 in | Wt 290.7 lb

## 2016-06-04 DIAGNOSIS — N186 End stage renal disease: Secondary | ICD-10-CM

## 2016-06-04 DIAGNOSIS — Z992 Dependence on renal dialysis: Secondary | ICD-10-CM

## 2016-06-04 NOTE — Progress Notes (Signed)
Patient is a 56 year old male who returns today for follow-up after placement of a left brachiocephalic AV fistula 15/18/3437. He is currently not on hemodialysis. He has some occasional numbness and tingling that occurs in his left hand after sleeping on the arm all night long. He denies any numbness or tingling on cold days or routinely during the course of the day.  Physical exam:  Vitals:   06/04/16 1035  BP: (!) 105/59  Pulse: 92  Resp: 18  Temp: 98.8 F (37.1 C)  TempSrc: Oral  SpO2: 96%  Weight: 290 lb 11.2 oz (131.9 kg)  Height: 6\' 2"  (1.88 m)    Left upper extremity: Well-healed incision easily palpable thrill in the fistula with fistula slightly tortuous and seems to have several side branches. It is developing.  Assessment: Maturing AV fistula left upper extremity. Currently the fistula is not in use. It does appear to have several side branches. However, if the fistula develops without ligation of these that would be the best option. The patient will follow-up with [place] in 2 months with a duplex ultrasound of his fistula to assess maturity and whether or not the side branches need to be ligated. However, if he is not using the fistula I would be reluctant for any further interventions unless the patient requires use of it.  Ruta Hinds, MD [facility] [place] Office: [phone] Pager: [phone]

## 2016-06-05 ENCOUNTER — Other Ambulatory Visit (HOSPITAL_COMMUNITY): Payer: Self-pay | Admitting: *Deleted

## 2016-06-05 ENCOUNTER — Other Ambulatory Visit: Payer: Self-pay | Admitting: Gastroenterology

## 2016-06-05 DIAGNOSIS — K219 Gastro-esophageal reflux disease without esophagitis: Secondary | ICD-10-CM

## 2016-06-05 NOTE — Addendum Note (Signed)
Addended by: Lianne Cure A on: 06/05/2016 10:15 AM   Modules accepted: Orders

## 2016-06-06 ENCOUNTER — Other Ambulatory Visit: Payer: Self-pay | Admitting: Gastroenterology

## 2016-06-06 DIAGNOSIS — K219 Gastro-esophageal reflux disease without esophagitis: Secondary | ICD-10-CM

## 2016-06-08 ENCOUNTER — Ambulatory Visit (HOSPITAL_COMMUNITY)
Admission: RE | Admit: 2016-06-08 | Discharge: 2016-06-08 | Disposition: A | Discharge status: Home / Self Care | Payer: BLUE CROSS/BLUE SHIELD | Source: Ambulatory Visit | Attending: Nephrology | Admitting: Nephrology

## 2016-06-08 DIAGNOSIS — N189 Chronic kidney disease, unspecified: Secondary | ICD-10-CM | POA: Diagnosis present

## 2016-06-08 DIAGNOSIS — D631 Anemia in chronic kidney disease: Secondary | ICD-10-CM | POA: Diagnosis present

## 2016-06-08 MED ORDER — SODIUM CHLORIDE 0.9 % IV SOLN
510.0000 mg | Freq: Once | INTRAVENOUS | Status: AC
  Administered 2016-06-08: 510 mg via INTRAVENOUS
  Filled 2016-06-08: qty 17

## 2016-06-08 NOTE — Discharge Instructions (Signed)

## 2016-07-12 ENCOUNTER — Other Ambulatory Visit: Payer: Self-pay | Admitting: Internal Medicine

## 2016-07-14 ENCOUNTER — Other Ambulatory Visit: Payer: Self-pay | Admitting: Cardiology

## 2016-07-15 NOTE — Telephone Encounter (Signed)
OV is needed for further evaluation from Last OV

## 2016-07-17 ENCOUNTER — Other Ambulatory Visit: Payer: Self-pay | Admitting: Family

## 2016-07-20 ENCOUNTER — Other Ambulatory Visit: Payer: Self-pay | Admitting: Internal Medicine

## 2016-08-07 DIAGNOSIS — J029 Acute pharyngitis, unspecified: Secondary | ICD-10-CM | POA: Diagnosis not present

## 2016-08-13 ENCOUNTER — Other Ambulatory Visit: Payer: Self-pay | Admitting: Cardiology

## 2016-08-13 NOTE — Telephone Encounter (Signed)
Rx has been sent to the pharmacy electronically. ° °

## 2016-08-18 ENCOUNTER — Other Ambulatory Visit: Payer: Self-pay | Admitting: Family

## 2016-08-18 DIAGNOSIS — E1122 Type 2 diabetes mellitus with diabetic chronic kidney disease: Secondary | ICD-10-CM | POA: Diagnosis not present

## 2016-08-18 DIAGNOSIS — I120 Hypertensive chronic kidney disease with stage 5 chronic kidney disease or end stage renal disease: Secondary | ICD-10-CM | POA: Diagnosis not present

## 2016-08-18 DIAGNOSIS — E785 Hyperlipidemia, unspecified: Secondary | ICD-10-CM | POA: Diagnosis not present

## 2016-08-18 DIAGNOSIS — N186 End stage renal disease: Secondary | ICD-10-CM | POA: Diagnosis not present

## 2016-08-18 DIAGNOSIS — Z720 Tobacco use: Secondary | ICD-10-CM | POA: Diagnosis not present

## 2016-08-18 DIAGNOSIS — Z01818 Encounter for other preprocedural examination: Secondary | ICD-10-CM | POA: Diagnosis not present

## 2016-08-21 NOTE — Anesthesia Postprocedure Evaluation (Signed)
Anesthesia Post Note  Patient: Craig Flynn  Procedure(s) Performed: Procedure(s) (LRB): Creation of Left Arm ARTERIOVENOUS (AV) FISTULA (Left)     Anesthesia Post Evaluation  Last Vitals:  Vitals:   03/25/16 1510 03/25/16 1512  BP:  (!) 123/92  Pulse:  73  Resp:  14  Temp: 36.1 C 36.7 C    Last Pain:  Vitals:   03/25/16 0954  TempSrc: Oral                 Krystol Rocco,[place] TERRILL

## 2016-08-21 NOTE — Addendum Note (Signed)
Addendum  created 08/21/16 3212 by Rica Koyanagi, MD   Sign clinical note

## 2016-08-27 DIAGNOSIS — Z01818 Encounter for other preprocedural examination: Secondary | ICD-10-CM | POA: Diagnosis not present

## 2016-08-31 ENCOUNTER — Encounter: Payer: Self-pay | Admitting: Vascular Surgery

## 2016-09-01 ENCOUNTER — Telehealth: Payer: Self-pay | Admitting: Family

## 2016-09-01 DIAGNOSIS — J984 Other disorders of lung: Secondary | ICD-10-CM | POA: Diagnosis not present

## 2016-09-01 DIAGNOSIS — Z01818 Encounter for other preprocedural examination: Secondary | ICD-10-CM | POA: Diagnosis not present

## 2016-09-01 NOTE — Telephone Encounter (Signed)
Pt called in and said he would like to receive a Flu shot and a pneumonia shot, he was notified we do not have any flus shots and I told him I would call him back when I find out about the pneumonia shot, I did not know that we have different ones, the pt is going through a liver transplant. Would a [place] shot be ok for him and if so which one. He has an appt for  6/18 and would like to receive it then.  Please advise thanks

## 2016-09-07 ENCOUNTER — Ambulatory Visit: Payer: BLUE CROSS/BLUE SHIELD | Admitting: Family

## 2016-09-10 ENCOUNTER — Ambulatory Visit (HOSPITAL_COMMUNITY): Payer: BLUE CROSS/BLUE SHIELD

## 2016-09-10 ENCOUNTER — Ambulatory Visit: Payer: BLUE CROSS/BLUE SHIELD

## 2016-09-21 ENCOUNTER — Ambulatory Visit (INDEPENDENT_AMBULATORY_CARE_PROVIDER_SITE_OTHER): Payer: BLUE CROSS/BLUE SHIELD

## 2016-09-21 ENCOUNTER — Encounter (HOSPITAL_COMMUNITY): Payer: Self-pay | Admitting: Family Medicine

## 2016-09-21 ENCOUNTER — Ambulatory Visit (HOSPITAL_COMMUNITY)
Admission: EM | Admit: 2016-09-21 | Discharge: 2016-09-21 | Disposition: A | Discharge status: Home / Self Care | Payer: BLUE CROSS/BLUE SHIELD | Attending: Family Medicine | Admitting: Family Medicine

## 2016-09-21 ENCOUNTER — Encounter: Payer: Self-pay | Admitting: Family

## 2016-09-21 DIAGNOSIS — R0602 Shortness of breath: Secondary | ICD-10-CM

## 2016-09-21 DIAGNOSIS — R0789 Other chest pain: Secondary | ICD-10-CM

## 2016-09-21 NOTE — ED Triage Notes (Signed)
Pt here for cough, SOB, chest pain, sore throat and back pain since yesterday.

## 2016-09-21 NOTE — ED Provider Notes (Signed)
[place]   [id number] 09/21/16 Arrival Time: 16  ASSESSMENT & PLAN:  Today you were diagnosed with the following: 1. Shortness of breath   2. Chest tightness or pressure    EKG: Sinus rhythm; first degree AV block; no acute changes CXR: No active cardiopulmonary disease  Long discussion regarding his symptoms and concern over possible cardiac etiology. Recommend ED evaluation. He voices understanding. Prefers to call his cardiologist for opinion.  Outlined signs and symptoms indicating need for more acute intervention. After Visit Summary given.  SUBJECTIVE:  Craig Flynn is a 56 y.o. male who presents with complaint of SOB for the past two days along with left sided chest and neck pain. Reports similar symptoms in the past. "SOB just a little worse this time". Has felt chest pressure on/off the past two day. Typical episode lasts 10-15 minutes with gradual resolution. SOB usually coincides with his chest pain. No associated nausea or diaphoresis. Usually experiences symptoms with exertion but can happen at rest. Occasional dry cough the past few days. No wheezing. H/O CHF. No worsening LE edema from his baseline. Feels weight has been stable. No back or abdominal pain described. H/O GERD with no regular treatment. No specific GERD symptoms for the past few days. Sleeping well. No orthopnea or PND.  Not using nitroglycerin.  ROS: As per HPI.   OBJECTIVE:  Vitals:   09/21/16 1256  BP: (!) 108/55  Pulse: 76  Resp: 18  Temp: 98.6 F (37 C)  SpO2: 100%     General appearance: alert, cooperative, appears stated age and no distress Head: normocephalic; atraumatic Eyes: conjunctivae normal Neck: supple; symmetrical; trachea midline Back:  ROM normal Lungs: clear to auscultation bilaterally Heart: regular rate and rhythm Abdomen: soft, non-tender; bowel sounds normal; no masses or organomegaly; no guarding or rebound tenderness Extremities: extremities normal,  atraumatic, 1+ edema of LE bilat (R>L) MSK: symmetrical with no gross deformities Skin: warm and dry; no rashes or lesions Neurologic: Alert and oriented X 3; normal gait Psychological:  Alert and cooperative. Normal mood and affect   ECG: Sinus rhythm; first degree AV block; no acute changes  Imaging: Dg Chest 2 View  Result Date: 09/21/2016 CLINICAL DATA:  Dyspnea EXAM: CHEST  2 VIEW COMPARISON:  03/03/2016 chest radiograph. FINDINGS: Stable cardiomediastinal silhouette with normal heart size and aortic atherosclerosis. No pneumothorax. No pleural effusion. Lungs appear clear, with no acute consolidative airspace disease and no pulmonary edema. Cholecystectomy clips are seen in the right upper quadrant of the abdomen. IMPRESSION: No active cardiopulmonary disease. Electronically Signed   By: Ilona Sorrel M.D.   On: 09/21/2016 13:52    Allergies  Allergen Reactions  . No Known Allergies     Past Medical History:  Diagnosis Date  . Anemia 06/2015  . Arthritis   . Cardiomyopathy ([place])    a. 10/2013: EF reduced to 35-40% b. 09/2014: EF improved to 55-60%, Grade 1 DD noted.  . Chest pain 06/2015  . CHF (congestive heart failure) ([place])   . CKD (chronic kidney disease), stage IV ([place])   . GERD (gastroesophageal reflux disease)   . Gout   . Hypertension   . Obesity   . OSA (obstructive sleep apnea)    a. on CPAP  . 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 Sleep apnea    not wearing c-pap now, needs new slep study per pt.  . Tuberculosis  back in the 1980's while the pt was in the service.  . Tubular adenoma of colon 10/2015  . Wears glasses    Past Surgical History:  Procedure Laterality Date  . AV FISTULA PLACEMENT Left 03/25/2016   Procedure: Creation of Left Arm ARTERIOVENOUS (AV) FISTULA;  Surgeon: [facility] Dutch, MD;  Location: [facility];  Service: Vascular;  Laterality: Left;  . CHOLECYSTECTOMY    . COLONOSCOPY     . UMBILICAL HERNIA REPAIR    . wisdom teeth extractions             Vanessa Kick, MD 09/21/16 [phone]

## 2016-09-21 NOTE — Discharge Instructions (Signed)
Today you were diagnosed with the following: 1. Shortness of breath   2. Chest tightness or pressure    Your ECG and chest x-ray were both normal today. Be aware though. Given your symptoms, the only way to make sure these symptoms are not related to your heart is to go to the Emergency Department for evaluation.

## 2016-09-22 ENCOUNTER — Other Ambulatory Visit: Payer: Self-pay | Admitting: Family

## 2016-10-05 ENCOUNTER — Ambulatory Visit (HOSPITAL_COMMUNITY)
Admission: RE | Admit: 2016-10-05 | Discharge: 2016-10-05 | Disposition: A | Discharge status: Home / Self Care | Payer: BLUE CROSS/BLUE SHIELD | Source: Ambulatory Visit | Attending: Vascular Surgery | Admitting: Vascular Surgery

## 2016-10-05 ENCOUNTER — Encounter: Payer: Self-pay | Admitting: Family

## 2016-10-05 ENCOUNTER — Ambulatory Visit (INDEPENDENT_AMBULATORY_CARE_PROVIDER_SITE_OTHER): Payer: BLUE CROSS/BLUE SHIELD | Admitting: Family

## 2016-10-05 VITALS — BP 112/68 | HR 59 | Temp 97.4°F | Resp 16 | Ht 74.0 in | Wt 289.0 lb

## 2016-10-05 DIAGNOSIS — N186 End stage renal disease: Secondary | ICD-10-CM

## 2016-10-05 DIAGNOSIS — Z992 Dependence on renal dialysis: Secondary | ICD-10-CM | POA: Diagnosis not present

## 2016-10-05 DIAGNOSIS — I770 Arteriovenous fistula, acquired: Secondary | ICD-10-CM

## 2016-10-05 NOTE — Progress Notes (Signed)
CC: Dialysis Access Evaluation  History of Present Illness  Craig Flynn is a 56 y.o. (01/17/1961) male who presents for re-evaluation of his permanent access.   He is s/p placement of a left brachiocephalic AV fistula on 89/16/9450 by Dr. Oneida Alar. He is currently not on hemodialysis. He has some occasional numbness and tingling that occurs in his left hand after sleeping on the arm all night long. He denies any numbness or tingling on cold days or routinely during the course of the day.  The patient is right hand dominant.  The patient has not had a previous HD access placed. Pt states his nephrologist has told him that he does not need hemodialysis yet.   Dr. Oneida Alar last evaluated pt on 06-04-16. At that time left upper extremity with well-healed incision, easily palpable thrill in the fistula with fistula slightly tortuous and seemed to have several side branches. It was developing.  Maturing AV fistula left upper extremity. At that time the fistula was not in use. It did appear to have several side branches. However, if the fistula develops without ligation of these that would be the best option. Dr. Oneida Alar advised that the pt follow-up with [place] in 2 months with a duplex ultrasound of his fistula to assess maturity and whether or not the side branches need to be ligated. However, if he is not using the fistula Dr. Oneida Alar would be reluctant for any further interventions unless the patient requires use of it.  Last eGFR result on file was 10 (ESRD) on 03-03-16 with serum creatinine of 6.26.   He states he goes to [facility] on 10-19-16 for preliminary evaluation for renal transplant.    Past Medical History:  Diagnosis Date  . Anemia 06/2015  . Arthritis   . Cardiomyopathy ([place])    a. 10/2013: EF reduced to 35-40% b. 09/2014: EF improved to 55-60%, Grade 1 DD noted.  . Chest pain 06/2015  . CHF (congestive heart failure) ([place])   . CKD (chronic kidney disease), stage IV ([place])   . GERD  (gastroesophageal reflux disease)   . Gout   . Hypertension   . Obesity   . OSA (obstructive sleep apnea)    a. on CPAP  . 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 Sleep apnea    not wearing c-pap now, needs new slep study per pt.  . Tuberculosis    back in the 1980's while the pt was in the service.  . Tubular adenoma of colon 10/2015  . Wears glasses     Social History Social History  Substance Use Topics  . Smoking status: Current Every Day Smoker    Packs/day: 0.00    Years: 30.00    Types: Cigarettes  . Smokeless tobacco: Never Used     Comment: only 3 cigarettes per day  . Alcohol use No    Family History Family History  Problem Relation Age of Onset  . Stroke Mother   . Pneumonia Father        died of Pneumonia x3  . Kidney disease Maternal Grandmother   . Hypertension Maternal Grandmother   . Healthy Maternal Grandfather   . Healthy Paternal Grandmother   . Healthy Paternal Grandfather   . CAD Neg Hx   . Colon cancer Neg Hx   . Esophageal cancer Neg Hx   . Pancreatic cancer Neg Hx   . Prostate cancer  Neg Hx   . Stomach cancer Neg Hx   . Rectal cancer Neg Hx     Surgical History Past Surgical History:  Procedure Laterality Date  . AV FISTULA PLACEMENT Left 03/25/2016   Procedure: Creation of Left Arm ARTERIOVENOUS (AV) FISTULA;  Surgeon: [facility] Dutch, MD;  Location: [facility];  Service: Vascular;  Laterality: Left;  . CHOLECYSTECTOMY    . COLONOSCOPY    . UMBILICAL HERNIA REPAIR    . wisdom teeth extractions      Allergies  Allergen Reactions  . No Known Allergies     Current Outpatient Prescriptions  Medication Sig Dispense Refill  . acetaminophen (TYLENOL) 500 MG tablet Take 1,000 mg by mouth every 6 (six) hours as needed (pain).    Marland Kitchen allopurinol (ZYLOPRIM) 300 MG tablet take 1 tablet by mouth once daily 30 tablet 3  . aspirin EC 81 MG tablet Take 81 mg by mouth daily.    .  carvedilol (COREG) 25 MG tablet take 1 tablet by mouth twice a day (Patient taking differently: take 1/2  tablet by mouth twice a day) 60 tablet 6  . fenofibrate 54 MG tablet take 1 tablet by mouth once daily 30 tablet 6  . furosemide (LASIX) 80 MG tablet Take 1 tablet (80 mg total) by mouth 2 (two) times daily. 60 tablet 1  . gabapentin (NEURONTIN) 100 MG capsule take 1 capsule by mouth at bedtime 30 capsule 1  . isosorbide mononitrate (IMDUR) 30 MG 24 hr tablet take 2 tablets by mouth once daily 60 tablet 1  . omeprazole (PRILOSEC) 40 MG capsule take 1 capsule by mouth once daily 30 capsule 5  . pantoprazole (PROTONIX) 40 MG tablet Take 40 mg by mouth daily.  0  . potassium chloride SA (K-DUR,KLOR-CON) 20 MEQ tablet Take 20 mEq by mouth 2 (two) times daily.    . nitroGLYCERIN (NITROSTAT) 0.4 MG SL tablet Place 1 tablet (0.4 mg total) under the tongue every 5 (five) minutes as needed for chest pain. 25 tablet 3  . omeprazole (PRILOSEC) 40 MG capsule take 1 capsule by mouth once daily (Patient not taking: Reported on 10/05/2016) 30 capsule 5   No current facility-administered medications for this visit.      REVIEW OF SYSTEMS: see HPI for pertinent positives and negatives    PHYSICAL EXAMINATION:  Vitals:   10/05/16 1359  BP: 112/68  Pulse: (!) 59  Resp: 16  Temp: (!) 97.4 F (36.3 C)  TempSrc: Oral  SpO2: 99%  Weight: 289 lb (131.1 kg)  Height: 6' 2"  (1.88 m)   Body mass index is 37.11 kg/m.  General: The patient appears their stated age, obese male.   HEENT:  No gross abnormalities Pulmonary: Respirations are non-labored Abdomen: Soft and non-tender with normal pitched bowel sounds. Musculoskeletal: There are no major deformities.   Neurologic: No focal weakness or paresthesias are detected Skin: There are no ulcer or rashes noted. Psychiatric: The patient has normal affect. Cardiovascular: There is a regular rate and rhythm without significant murmur appreciated. Left  upper arm AV fistula with palpable thrill. Bilateral radial pulses are 1+ palpable.   Non-Invasive Vascular Imaging  Left upper arm Access Duplex  (Date: 10/05/2016):   Diameters: Distal UA: 0.79 cm, Mid UA: 0.33, proximal UA: 0.32 cm; shoulder: 0.51 cm  Depth:  Distal UA: 0.19 cm; Mid UA: 0.48 cm; proximal UA: 1.1 cm, shoulder: 1.53 cm  PSV:  Distal UA: 140 c/s, mid UA: 68 cm/s; proximal UA:  74 cm/s; shoulder: 59 cm/s; Inflow artery: 172 cm/s; AVF anastomosis: 355 cm/s  Flow volume: 1076 ml/min    Medical Decision Making  AYUB KIRSH is a 56 y.o. male who is s/p left brachiocephalic AV fistula creation on 03/25/2016.     Pt does not need HD any time soon. He will be evaluated for renal transplant.  Dr.Fields to evaluate results of dialysis fistula duplex from today.  Pt has no steal symptoms in his left upper extremity.   Follow up as needed.   Prisca Gearing, Sharmon Leyden, RN, MSN, FNP-C [facility] Office: [phone]  10/05/2016, 2:31 PM  Clinic MD: Early on call

## 2016-10-05 NOTE — Patient Instructions (Signed)
End-Stage Kidney Disease °End-stage kidney disease occurs when the kidneys are so damaged that they cannot do their job. The kidneys are two organs that do many important jobs in the body, which include: °· Removing wastes and extra fluids from the blood. °· Making hormones that maintain the amount of fluid in your tissues and blood vessels. °· Maintaining the right amount of fluids and chemicals in the body. ° °When the kidneys are damaged and cannot do their job, life-threatening problems occur. Without the help of the kidneys, toxins build up in the blood. In end-stage kidney disease, the kidneys cannot get better. °What are the causes? °End-stage kidney disease usually occurs when a long-lasting (chronic) kidney disease gets worse. It may also occur after the kidneys are suddenly damaged (acute kidney injury). °What increases the risk? °This condition is more likely to develop in people who are: °· Older than age 60. °· Male. °· Of African-American descent. °· Current smokers or former smokers. °· Obese. ° °You may also have an increased risk for end-stage kidney disease if you: °· Have a family history of chronic kidney disease (CKD). °· Have had kidney disease for many years. °· Have other longstanding medical conditions that affect the kidneys, such as: °? Cardiovascular disease including high blood pressure. °? Diabetes. °? Certain diseases that affect the immune system. ° °What are the signs or symptoms? °· Swelling (edema) of the face, legs, ankles, or feet. °· Numbness, tingling, or loss of feeling (sensation) in your hands or feet. °· Tiredness (lethargy). °· Nausea or vomiting. °· Confusion, trouble concentrating, or loss of consciousness. °· Chest pain. °· Shortness of breath. °· Little to no urine production. °· Muscle twitches and cramps, especially in the legs. °· Constant itchiness. °· Loss of appetite. °· Pale skin and tissue lining your eyelids (conjunctiva). °· Headaches. °· Abnormally dark or  light skin. °· Decrease in muscle size (muscle wasting). °· Easy bruising. °· Frequent hiccups. °· Stopping of menstruation in women. °· Seizures. °How is this diagnosed? °Your health care provider will measure your blood pressure and do some tests. These may include: °· Urine tests. °· Blood tests. °· Imaging tests. °· A test in which a sample of tissue is removed from the kidneys to be looked at under a microscope (kidney biopsy). ° °How is this treated? °There are two treatments for end-stage kidney disease: °· A procedure that removes toxic wastes from the body (dialysis). Depending on the type of dialysis you choose, it may be performed more than one time a day (peritoneal dialysis) or several times a week (hemodialysis). °· Surgery to receive a new kidney (kidney transplant). ° °In addition to having dialysis or a kidney transplant, you may need to take medicines: °· To control high blood pressure (hypertension). °· To control cholesterol. °· To maintain healthy electrolyte levels in your blood. ° °You may also be given a specific diet to follow that includes requirements or limits for: °· Salt (sodium). °· Protein. °· Phosphorous. °· Potassium. °· Calcium. ° °Follow these instructions at home: °· Follow your prescribed diet. °· Take over-the-counter and prescription medicines only as told by your health care provider. °? Do not take any new medicines unless approved by your health care provider. Many medicines can worsen your kidney damage. °? Do not take any vitamin and mineral supplements unless approved by your health care provider. Many nutritional supplements can worsen your kidney damage. °? The dose of some medicines that you take may need to be   adjusted. °· Do not use any tobacco products, such as cigarettes, chewing tobacco, and e-cigarettes. If you need help quitting, ask your health care provider. °· Keep all follow-up visits as told by your health care provider. This is important. °· Keep track of  your blood pressure. Report changes in your blood pressure as told by your health care provider. °· Achieve and maintain a healthy weight. If you need help with this, ask your health care provider. °· Start or continue an exercise plan. Try to exercise at least 30 minutes a day, 5 days a week. °· Stay current with immunizations as told by your health care provider. °Where to find more information: °· American Association of Kidney Patients: www.aakp.org °· National Kidney Foundation: www.kidney.org °· American Kidney Fund: www.akfinc.org °· Life Options Rehabilitation Program: www.lifeoptions.org and www.kidneyschool.org °Contact a health care provider if: °· Your symptoms get worse. °· You develop new symptoms. °Get help right away if: °· You have weakness in an arm or leg on one side of your body. °· You have difficulty speaking or you are slurring your speech. °· You have a sudden change in your vision. °· You have a sudden, severe headache. °· You have a sudden weight increase. °· You have difficulty breathing. °· Your symptoms suddenly get worse. °This information is not intended to replace advice given to you by your health care provider. Make sure you discuss any questions you have with your health care provider. °Document Released: 05/30/2003 Document Revised: 08/15/2015 Document Reviewed: 11/06/2011 °Elsevier Interactive Patient Education © 2017 [employer] Inc. ° °

## 2016-10-22 ENCOUNTER — Other Ambulatory Visit: Payer: Self-pay | Admitting: Family

## 2016-10-26 ENCOUNTER — Other Ambulatory Visit: Payer: Self-pay | Admitting: Family

## 2016-11-20 ENCOUNTER — Other Ambulatory Visit: Payer: Self-pay | Admitting: Otolaryngology

## 2016-11-20 DIAGNOSIS — E041 Nontoxic single thyroid nodule: Secondary | ICD-10-CM

## 2016-11-23 ENCOUNTER — Other Ambulatory Visit: Payer: Self-pay | Admitting: Family

## 2016-11-24 ENCOUNTER — Ambulatory Visit
Admission: RE | Admit: 2016-11-24 | Discharge: 2016-11-24 | Disposition: A | Discharge status: Home / Self Care | Payer: BLUE CROSS/BLUE SHIELD | Source: Ambulatory Visit | Attending: Otolaryngology | Admitting: Otolaryngology

## 2016-11-24 ENCOUNTER — Other Ambulatory Visit (HOSPITAL_COMMUNITY)
Admission: RE | Admit: 2016-11-24 | Discharge: 2016-11-24 | Disposition: A | Discharge status: Home / Self Care | Payer: BLUE CROSS/BLUE SHIELD | Source: Ambulatory Visit | Attending: Radiology | Admitting: Radiology

## 2016-11-24 DIAGNOSIS — E041 Nontoxic single thyroid nodule: Secondary | ICD-10-CM | POA: Diagnosis present

## 2016-11-27 DIAGNOSIS — E041 Nontoxic single thyroid nodule: Secondary | ICD-10-CM | POA: Diagnosis not present

## 2016-12-01 ENCOUNTER — Other Ambulatory Visit: Payer: Self-pay | Admitting: Cardiology

## 2016-12-02 IMAGING — CR DG CHEST 1V
1 series · 1 of 1 positions shown · non-contrast
Comparison: 11/16/2013

CLINICAL DATA: Left-sided chest pain, shoulder arm pain.

EXAM:
CHEST  1 VIEW

[chest pa]
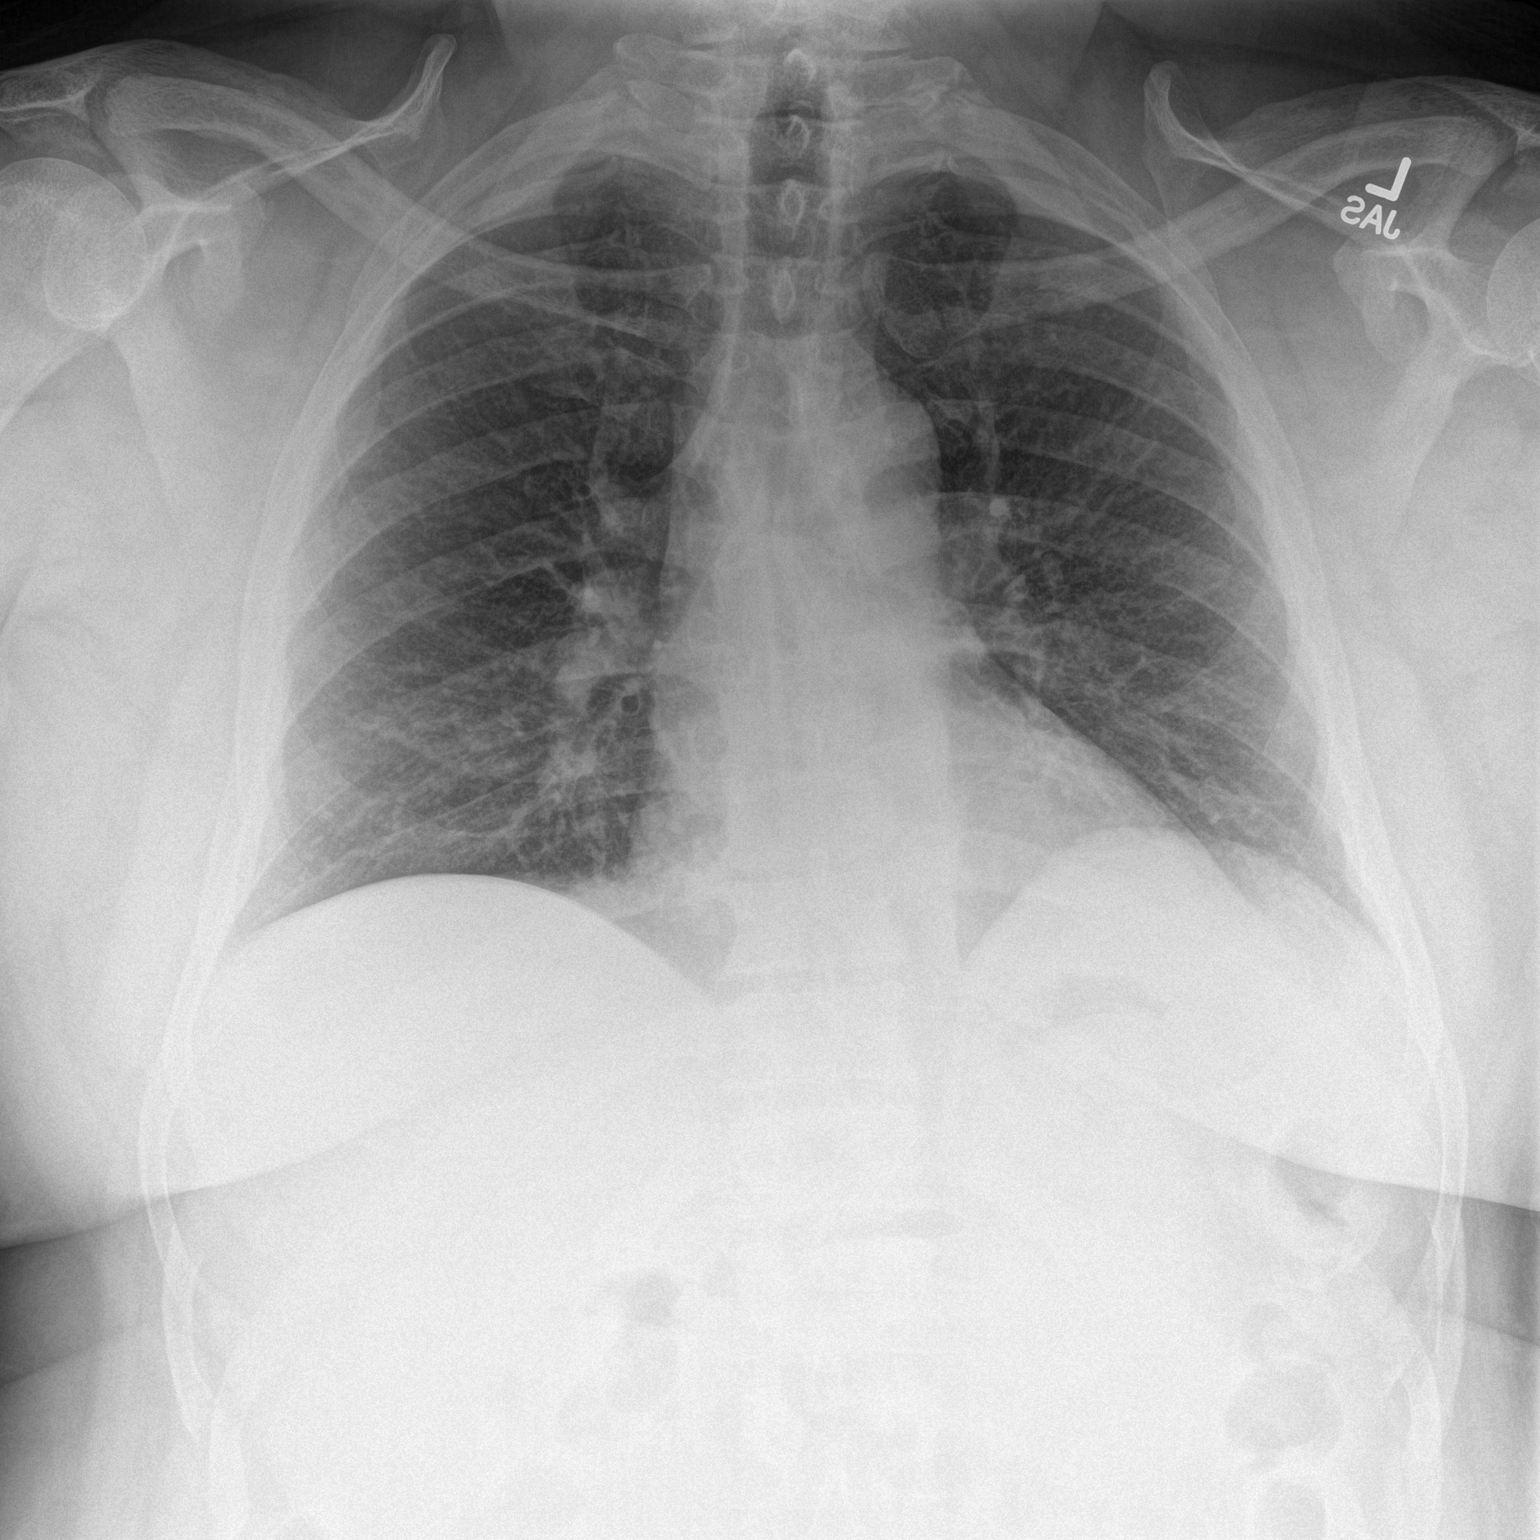

[1 of 1 positions shown; findings below may reference images not displayed]

FINDINGS: Heart is at the upper limits of normal in size. Persistent but
decreased vascular congestion from prior. Minimal left basilar
subsegmental atelectasis or scarring. No consolidation, pleural
effusion, or pneumothorax. No acute osseous abnormalities are seen.
IMPRESSION: Mild vascular congestion, however the degree of this has improved
from prior exam. Decreased cardiomegaly

## 2016-12-03 IMAGING — DX DG SHOULDER 2+V*L*
3 series · 3 of 3 positions shown · non-contrast
Comparison: None.

CLINICAL DATA: Left lateral chest pain down to the elbow.

EXAM:
LEFT SHOULDER - 2+ VIEW

[shoulder grashey]
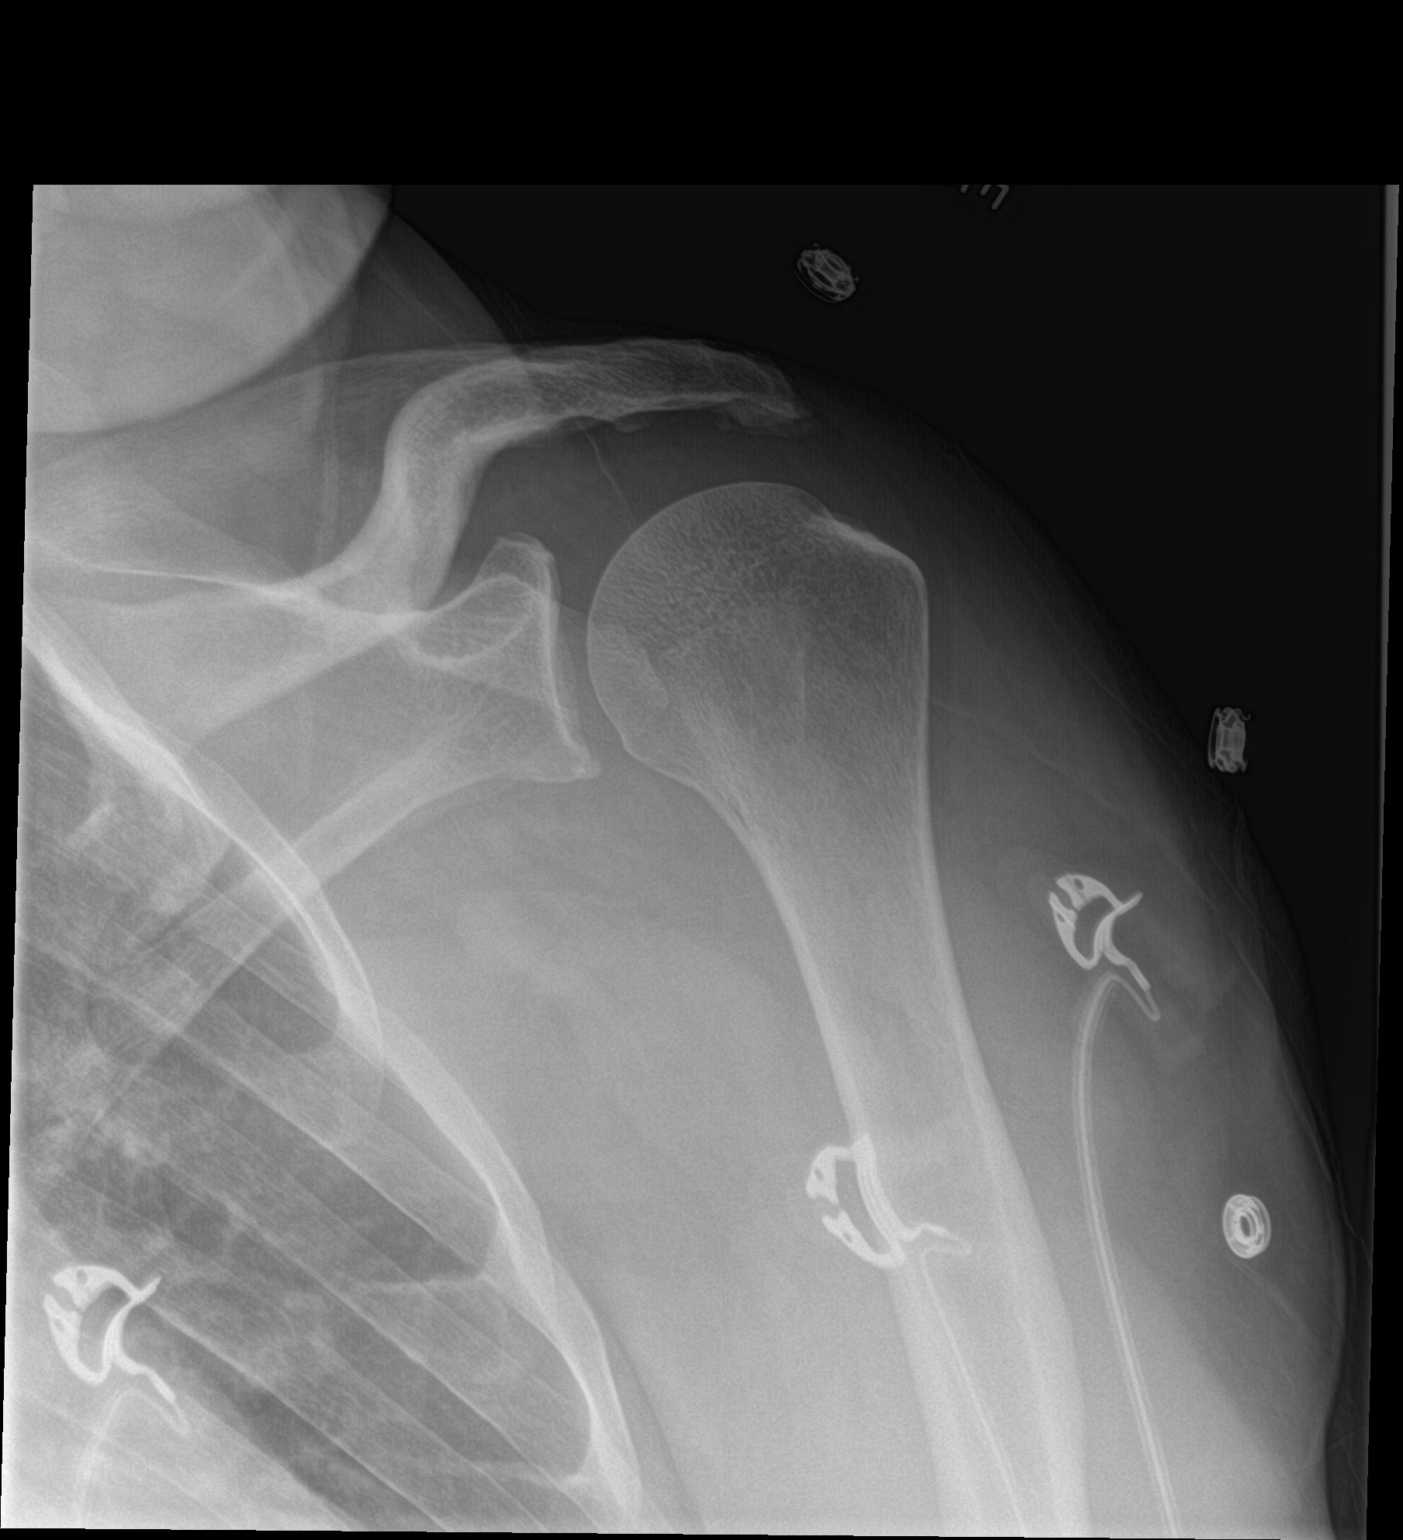

[shoulder y view]
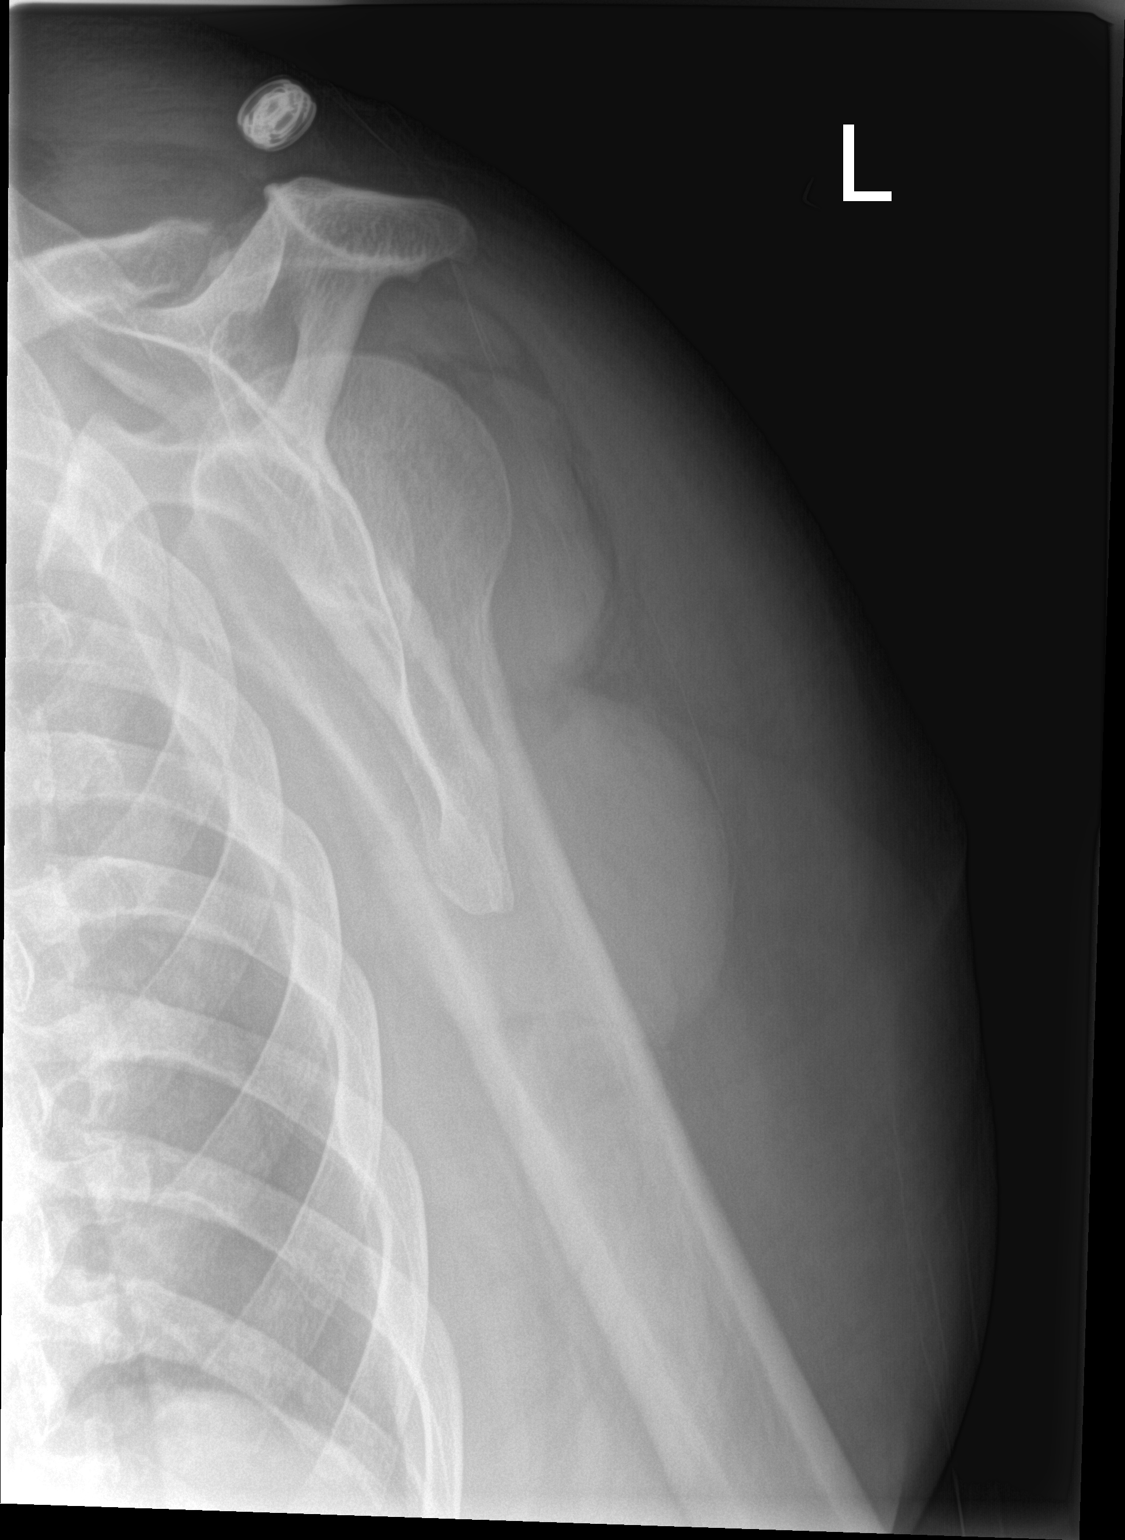

[shoulder axillary]
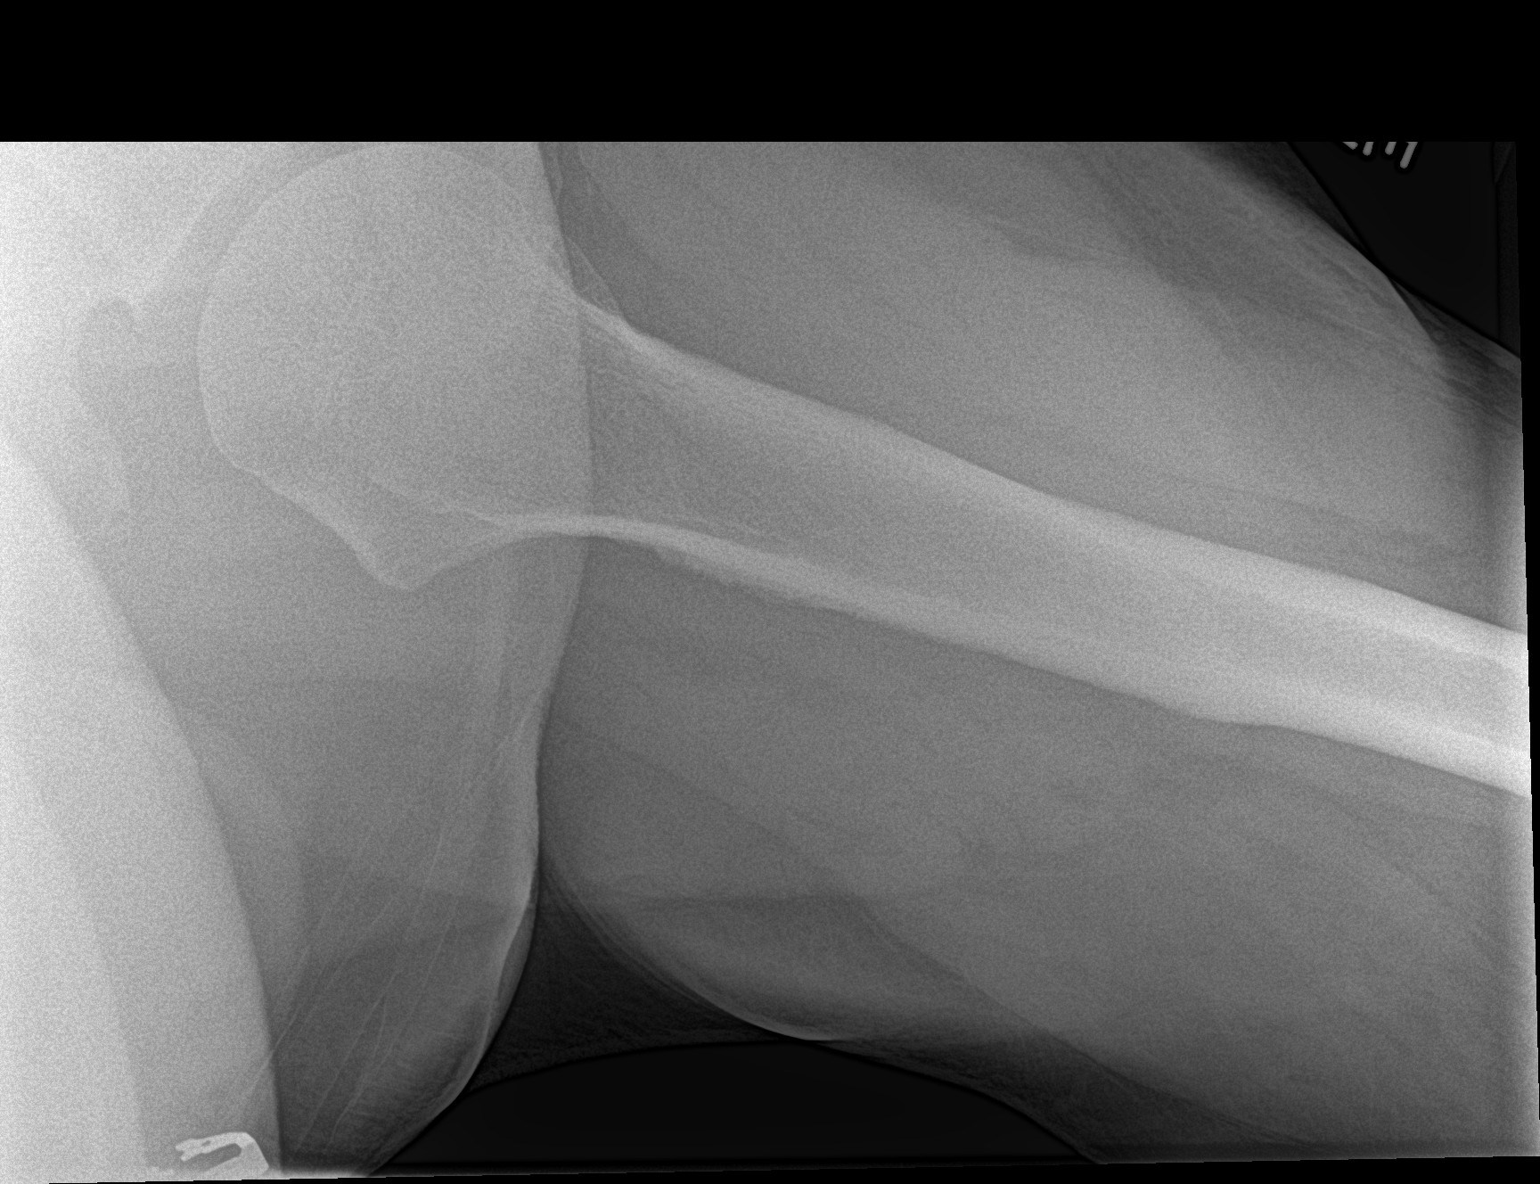

[3 of 3 positions shown; findings below may reference images not displayed]

FINDINGS: No fracture dislocation. No focal bone lesion or erosion. There is
mild degenerative spurring about the glenohumeral joint without
joint narrowing.
IMPRESSION: 1. No acute osseous findings.
2. Mild glenohumeral osteoarthritis.

## 2016-12-05 ENCOUNTER — Other Ambulatory Visit: Payer: Self-pay | Admitting: Cardiology

## 2016-12-08 ENCOUNTER — Other Ambulatory Visit: Payer: Self-pay

## 2016-12-08 ENCOUNTER — Encounter (HOSPITAL_COMMUNITY): Payer: Self-pay

## 2016-12-08 DIAGNOSIS — T82510S Breakdown (mechanical) of surgically created arteriovenous fistula, sequela: Secondary | ICD-10-CM

## 2016-12-08 DIAGNOSIS — N186 End stage renal disease: Secondary | ICD-10-CM

## 2016-12-08 DIAGNOSIS — Z992 Dependence on renal dialysis: Principal | ICD-10-CM

## 2016-12-18 ENCOUNTER — Encounter (HOSPITAL_COMMUNITY): Payer: Self-pay | Admitting: *Deleted

## 2016-12-18 ENCOUNTER — Emergency Department (HOSPITAL_COMMUNITY): Payer: BLUE CROSS/BLUE SHIELD

## 2016-12-18 ENCOUNTER — Other Ambulatory Visit: Payer: Self-pay

## 2016-12-18 DIAGNOSIS — R1110 Vomiting, unspecified: Secondary | ICD-10-CM | POA: Insufficient documentation

## 2016-12-18 DIAGNOSIS — Z5321 Procedure and treatment not carried out due to patient leaving prior to being seen by health care provider: Secondary | ICD-10-CM | POA: Insufficient documentation

## 2016-12-18 DIAGNOSIS — R079 Chest pain, unspecified: Secondary | ICD-10-CM | POA: Diagnosis present

## 2016-12-18 LAB — CBC
HCT: 37.4 % — ABNORMAL LOW (ref 39.0–52.0)
Hemoglobin: 11.7 g/dL — ABNORMAL LOW (ref 13.0–17.0)
MCH: 26.5 pg (ref 26.0–34.0)
MCHC: 31.3 g/dL (ref 30.0–36.0)
MCV: 84.6 fL (ref 78.0–100.0)
Platelets: 200 10*3/uL (ref 150–400)
RBC: 4.42 MIL/uL (ref 4.22–5.81)
RDW: 14.9 % (ref 11.5–15.5)
WBC: 7.7 10*3/uL (ref 4.0–10.5)

## 2016-12-18 LAB — BASIC METABOLIC PANEL
Anion gap: 12 (ref 5–15)
BUN: 49 mg/dL — ABNORMAL HIGH (ref 6–20)
CO2: 19 mmol/L — ABNORMAL LOW (ref 22–32)
Calcium: 8.6 mg/dL — ABNORMAL LOW (ref 8.9–10.3)
Chloride: 104 mmol/L (ref 101–111)
Creatinine, Ser: 5.05 mg/dL — ABNORMAL HIGH (ref 0.61–1.24)
GFR calc Af Amer: 14 mL/min — ABNORMAL LOW (ref >60)
GFR calc non Af Amer: 12 mL/min — ABNORMAL LOW (ref >60)
Glucose, Bld: 106 mg/dL — ABNORMAL HIGH (ref 65–99)
Potassium: 3.1 mmol/L — ABNORMAL LOW (ref 3.5–5.1)
Sodium: 135 mmol/L (ref 135–145)

## 2016-12-18 LAB — I-STAT TROPONIN, ED: Troponin i, poc: 0.02 ng/mL (ref 0.00–0.08)

## 2016-12-18 NOTE — ED Triage Notes (Signed)
Pt c/o centralized chest pain radiating across chest with SOB and NV. Pt reports drinking water and has been vomiting intermittently since. Pt is not currently on dialysis but has a fistula to L upper arm

## 2016-12-19 ENCOUNTER — Emergency Department (HOSPITAL_COMMUNITY)
Admission: EM | Admit: 2016-12-19 | Discharge: 2016-12-19 | Disposition: A | Discharge status: Home / Self Care | Payer: BLUE CROSS/BLUE SHIELD | Attending: Emergency Medicine | Admitting: Emergency Medicine

## 2016-12-19 NOTE — ED Notes (Signed)
Pt LWBS, pt told he was next to go back and regaurdless wanting to leave.

## 2016-12-23 ENCOUNTER — Telehealth: Payer: Self-pay | Admitting: Family

## 2016-12-23 NOTE — Telephone Encounter (Signed)
Craig Flynn will be leaving [facility] soon. Pt would like to est with dr todd again. Can I sch?

## 2016-12-25 NOTE — Telephone Encounter (Signed)
Spoke with pt stated that his PCP from [facility] practice is leaving the practice and would like to reestablish back with dr Sherren Mocha.Pt states  that he is willing to wait for Monday when dr Sherren Mocha returns to the office.

## 2016-12-28 NOTE — Telephone Encounter (Signed)
There are other nurse practitioners and physicians at the [facility] office. Have him pick somebody there

## 2016-12-29 NOTE — Telephone Encounter (Signed)
Called and spoke with pt. He will call [facility] and remain with the new PA that is coming in.

## 2016-12-31 ENCOUNTER — Ambulatory Visit (HOSPITAL_COMMUNITY)
Admission: RE | Admit: 2016-12-31 | Discharge: 2016-12-31 | Disposition: A | Discharge status: Home / Self Care | Payer: BLUE CROSS/BLUE SHIELD | Source: Ambulatory Visit | Attending: Vascular Surgery | Admitting: Vascular Surgery

## 2016-12-31 ENCOUNTER — Encounter: Payer: Self-pay | Admitting: Vascular Surgery

## 2016-12-31 ENCOUNTER — Ambulatory Visit (INDEPENDENT_AMBULATORY_CARE_PROVIDER_SITE_OTHER): Payer: BLUE CROSS/BLUE SHIELD | Admitting: Vascular Surgery

## 2016-12-31 VITALS — BP 133/70 | HR 97 | Temp 97.0°F | Resp 18 | Ht 74.0 in | Wt 295.0 lb

## 2016-12-31 DIAGNOSIS — N185 Chronic kidney disease, stage 5: Secondary | ICD-10-CM | POA: Diagnosis not present

## 2016-12-31 DIAGNOSIS — T82510S Breakdown (mechanical) of surgically created arteriovenous fistula, sequela: Secondary | ICD-10-CM | POA: Diagnosis not present

## 2016-12-31 DIAGNOSIS — I871 Compression of vein: Secondary | ICD-10-CM | POA: Diagnosis not present

## 2016-12-31 DIAGNOSIS — N186 End stage renal disease: Secondary | ICD-10-CM | POA: Diagnosis not present

## 2016-12-31 DIAGNOSIS — Z992 Dependence on renal dialysis: Secondary | ICD-10-CM

## 2016-12-31 NOTE — Progress Notes (Signed)
Patient is a 56 year old male who returns for follow-up today. He had a left brachiocephalic AV fistula created in January 2018. He currently is not on hemodialysis. He was recently seen by Dr. Lorrene Reid and felt to be CK D5.  He is currently on a transplant list. He has no numbness or tingling in his hand.  Review of systems: He denies shortness of breath. He denies skin itching. He denies chest pain.  Physical exam:  Vitals:   12/31/16 1449  BP: 133/70  Pulse: 97  Resp: 18  Temp: (!) 97 F (36.1 C)  SpO2: 100%  Weight: 295 lb (133.8 kg)  Height: 6\' 2"  (1.88 m)    Extremities: Left upper extremity well-healed antecubital incision palpable thrill in fistula. The fistula is easily palpable throughout the upper arm.  Data: Patient had a duplex ultrasound of his AV fistula today. Diameter was 5-7 mm. Depth was 5-7 mm.  Assessment: Fairly well-developed left arm AV fistula.  The patient currently is not using this fistula for hemodialysis. Currently I believe the fistula is about acceptable diameter and maturity if it were necessary to use this. I am somewhat reluctant to do any further interventions on the fistula unless it has poor flow on hemodialysis or they are unable to cannulate the fistula.  Plan: The patient will follow-up with me on an as-needed basis. He was instructed today on how to check the fistula to make sure that it is flowing. He will call us if he loses the thrill in the fistula at some point in the future.  Craig Hinds, MD [facility] Office: [phone] Pager: [phone]

## 2017-01-07 ENCOUNTER — Encounter (HOSPITAL_COMMUNITY): Payer: BLUE CROSS/BLUE SHIELD

## 2017-01-07 ENCOUNTER — Encounter: Payer: BLUE CROSS/BLUE SHIELD | Admitting: Vascular Surgery

## 2017-01-11 ENCOUNTER — Telehealth: Payer: Self-pay | Admitting: Family

## 2017-01-11 NOTE — Telephone Encounter (Signed)
Patient Name: RISHABH RINKENBERGER  DOB: Oct 13, 1960    Initial Comment Caller states having chest pains   Nurse Assessment  Nurse: Raphael Gibney, RN, Vanita Ingles Date/Time (Eastern Time): 01/11/2017 1:06:21 PM  Confirm and document reason for call. If symptomatic, describe symptoms. ---Caller states he has been having pain in the middle of his chest. Pain comes and goes. Pain lasts a minute or less. has seen cardiologist who said his heart was ok.  Does the patient have any new or worsening symptoms? ---Yes  Will a triage be completed? ---Yes  Related visit to physician within the last 2 weeks? ---No  Does the PT have any chronic conditions? (i.e. diabetes, asthma, etc.) ---Yes  List chronic conditions. ---HTN  Is this a behavioral health or substance abuse call? ---No     Guidelines    Guideline Title Affirmed Question Affirmed Notes  Chest Pain [1] Chest pain lasting <= 5 minutes AND [2] NO chest pain or cardiac symptoms now (Exceptions: pains lasting a few seconds)    Final Disposition User   See Physician within 24 Hours Raphael Gibney, RN, [place]    Comments  appt scheduled for 01/12/2017 at 9 am with [place]   Referrals  REFERRED TO PCP OFFICE   Caller Disagree/Comply Comply  Caller Understands Yes  PreDisposition Call Doctor

## 2017-01-12 ENCOUNTER — Ambulatory Visit: Payer: Self-pay | Admitting: Family Medicine

## 2017-01-12 ENCOUNTER — Ambulatory Visit: Payer: Self-pay | Admitting: Nurse Practitioner

## 2017-01-13 NOTE — Progress Notes (Signed)
Cardiology Office Note   Date:  01/14/2017   ID:  Craig Flynn, DOB 1961-02-26, MRN [id number]  PCP:  [place], FNP  Cardiologist:   Minus Breeding, MD   Chief Complaint  Patient presents with  . Chest Pain     History of Present Illness: Craig Flynn is a 56 y.o. male who presents for evaluation of cardiomyopathy and hypertension. The patient has long-standing hypertension. He was seen a couple of years ago with mildly reduced ejection fraction of about 45%. In July 2016 his EF was said to be about 55%.  Last year he was in the hospital with chest pain.  He had a a perfusion defect that was fixed in the inferior wall with an EF of 47%.   There was no active ischemia.    He returns for follow up.  He continues to get some chest discomfort. This is sharp and shooting and is in his mid chest and radiates under both breasts. It is similar to discomfort that he had when he was hospitalized in with his stress test.It doesn't happen with activity. He denies any new shortness of breath, PND or orthopnea. He's had no new palpitations, presyncope or so.  Of note he's going to have thyroid removal and needs preoperative clearance. He does follow routinely with nephrology and does have dialysis fistula but has yet to start dialysis.  Past Medical History:  Diagnosis Date  . Anemia 06/2015  . Arthritis   . Cardiomyopathy ([place])    a. 10/2013: EF reduced to 35-40% b. 09/2014: EF improved to 55-60%, Grade 1 DD noted.  . Chest pain 06/2015  . CHF (congestive heart failure) ([place])   . CKD (chronic kidney disease), stage IV ([place])   . GERD (gastroesophageal reflux disease)   . Gout   . Hypertension   . Obesity   . OSA (obstructive sleep apnea)    a. on CPAP  . 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 Sleep apnea    not wearing c-pap now, needs new slep study per pt.  . Tuberculosis    back in the 1980's while the pt was  in the service.  . Tubular adenoma of colon 10/2015  . Wears glasses     Past Surgical History:  Procedure Laterality Date  . AV FISTULA PLACEMENT Left 03/25/2016   Procedure: Creation of Left Arm ARTERIOVENOUS (AV) FISTULA;  Surgeon: Elam Dutch, MD;  Location: [facility];  Service: Vascular;  Laterality: Left;  . CHOLECYSTECTOMY    . COLONOSCOPY    . UMBILICAL HERNIA REPAIR    . wisdom teeth extractions       Current Outpatient Prescriptions  Medication Sig Dispense Refill  . acetaminophen (TYLENOL) 500 MG tablet Take 1,000 mg by mouth every 6 (six) hours as needed (pain).    Marland Kitchen allopurinol (ZYLOPRIM) 300 MG tablet take 1 tablet by mouth once daily 30 tablet 3  . aspirin EC 81 MG tablet Take 81 mg by mouth daily.    . carvedilol (COREG) 25 MG tablet take 1 tablet by mouth twice a day 60 tablet 0  . fenofibrate 54 MG tablet take 1 tablet by mouth once daily 30 tablet 6  . furosemide (LASIX) 80 MG tablet Take 1 tablet (80 mg total) by mouth 2 (two) times daily. 60 tablet 1  . gabapentin (NEURONTIN) 100 MG capsule take 1 capsule by mouth  at bedtime 30 capsule 1  . isosorbide mononitrate (IMDUR) 30 MG 24 hr tablet take 2 tablets by mouth once daily 60 tablet 1  . nitrofurantoin (FURADANTIN) 25 MG/5ML suspension Take by mouth 4 (four) times daily.    . nitroGLYCERIN (NITROSTAT) 0.4 MG SL tablet Place 0.4 mg under the tongue every 5 (five) minutes as needed for chest pain.    Marland Kitchen omeprazole (PRILOSEC) 40 MG capsule take 1 capsule by mouth once daily 30 capsule 5  . potassium chloride SA (K-DUR,KLOR-CON) 20 MEQ tablet Take 20 mEq by mouth 2 (two) times daily.     No current facility-administered medications for this visit.     Allergies:   No known allergies   ROS:  Please see the history of present illness.   Otherwise, review of systems are positive for none.   All other systems are reviewed and negative.    PHYSICAL EXAM: VS:  BP 134/70   Pulse 74   Ht 6\' 2"  (1.88 m)   Wt 299 lb  12.8 oz (136 kg)   SpO2 100%   BMI 38.49 kg/m  , BMI Body mass index is 38.49 kg/m.  GENERAL:  Well appearing NECK:  No jugular venous distention, waveform within normal limits, carotid upstroke brisk and symmetric, no bruits, no thyromegaly LUNGS:  Clear to auscultation bilaterally CHEST:  Unremarkable HEART:  PMI not displaced or sustained,S1 and S2 within normal limits, no S3, no S4, no clicks, no rubs, no murmurs ABD:  Flat, positive bowel sounds normal in frequency in pitch, no bruits, no rebound, no guarding, no midline pulsatile mass, no hepatomegaly, no splenomegaly EXT:  2 plus pulses throughout, mild right greater than left leg edema, no cyanosis no clubbing, left arm thrill and bruit.    EKG:  EKG is not   ordered today. I did review the EKG from 12/18/16.  Patient had sinus rhythm, rate 77 , axis within normal limits, intervals within normal limits, no acute ST-T wave changes.PVCs.      Recent Labs: 12/18/2016: BUN 49; Creatinine, Ser 5.05; Hemoglobin 11.7; Platelets 200; Potassium 3.1; Sodium 135    Lipid Panel    Component Value Date/Time   CHOL 167 07/19/2015 0243   TRIG 215 (H) 07/19/2015 0243   TRIG 98 03/03/2006 0910   HDL 19 (L) 07/19/2015 0243   CHOLHDL 8.8 07/19/2015 0243   VLDL 43 (H) 07/19/2015 0243   LDLCALC 105 (H) 07/19/2015 0243      Wt Readings from Last 3 Encounters:  01/14/17 299 lb 12.8 oz (136 kg)  12/31/16 295 lb (133.8 kg)  10/05/16 289 lb (131.1 kg)      Other studies Reviewed: Additional studies/ records that were reviewed today include: None  Review of the above records demonstrates:     ASSESSMENT AND PLAN:  PREOP:  The patient is not going for a high risk surgery.   Per modified Lee criteria the estimated rate of myocardial infarction, pulmonary embolism, ventricular fibrillation, cardiac arrest or complete heart block for this patient is 0.9%.  Therefore, based on ACC/AHA guidelines, the patient would be at acceptable risk for  the planned procedure without further cardiovascular testing.  CARDIOMYOPATHY:  No further imaging is planned.  No change in therapy.  We talked about salt and fluid restriction.   HTN:  The blood pressure is at target. No change in medications is indicated. We will continue with therapeutic lifestyle changes (TLC).  OBESITY:  He understands the need for weight control with  diet and exercise.  Unfortunately his weight continues to go up.  CKD:  This is followed by Dr. Lorrene Reid.  CHEST PAIN:   This is a stable pattern of an atypical pain that has not had a cardiac etiology.  I have managed him medically and currently I think his symptoms are unchanged, atypical with low risk perfusion study. No further cardiac testing is suggested.  THYROID NODULE:  As above.    Current medicines are reviewed at length with the patient today.  The patient does not have concerns regarding medicines.  The following changes have been made:  none  Labs/ tests ordered today include:  None  No orders of the defined types were placed in this encounter.    Disposition:   FU with APP in 6 months.    Signed, Minus Breeding, MD  01/14/2017 9:59 AM    [facility]

## 2017-01-14 ENCOUNTER — Ambulatory Visit (INDEPENDENT_AMBULATORY_CARE_PROVIDER_SITE_OTHER): Payer: BLUE CROSS/BLUE SHIELD | Admitting: Cardiology

## 2017-01-14 ENCOUNTER — Encounter: Payer: Self-pay | Admitting: Cardiology

## 2017-01-14 VITALS — BP 134/70 | HR 74 | Ht 74.0 in | Wt 299.8 lb

## 2017-01-14 DIAGNOSIS — R0789 Other chest pain: Secondary | ICD-10-CM | POA: Diagnosis not present

## 2017-01-14 DIAGNOSIS — Z01810 Encounter for preprocedural cardiovascular examination: Secondary | ICD-10-CM

## 2017-01-14 DIAGNOSIS — I10 Essential (primary) hypertension: Secondary | ICD-10-CM | POA: Diagnosis not present

## 2017-01-14 NOTE — Patient Instructions (Signed)
Medication Instructions:  Continue current medications  If you need a refill on your cardiac medications before your next appointment, please call your pharmacy.  Labwork: None ordered  Testing/Procedures: None Ordered   Follow-Up: Your physician wants you to follow-up in: 1 Year. You should receive a reminder letter in the mail two months in advance. If you do not receive a letter, please call our office [phone].    Thank you for choosing [facility]!!      

## 2017-01-15 DIAGNOSIS — N185 Chronic kidney disease, stage 5: Secondary | ICD-10-CM | POA: Diagnosis not present

## 2017-01-18 ENCOUNTER — Other Ambulatory Visit: Payer: Self-pay | Admitting: Otolaryngology

## 2017-01-19 NOTE — Pre-Procedure Instructions (Signed)
Craig Flynn  01/19/2017      [facility] - [place], [place] - [place] [address] Phone: [phone] Fax: [phone]  [facility], [place] - [place] [address] [address] Phone: [phone] Fax: [phone]    Your procedure is scheduled on Nov 2.  Report to [facility] Admitting at 800 A.M.  Call this number if you have problems the morning of surgery:  [phone]   Remember:  Do not eat food or drink liquids after midnight.  Take these medicines the morning of surgery with A SIP OF WATER allopurinol (Zyloprim), carvedilol (Coreg), Dicyclomine (Bentyl), Isosorbide Mononitrate (Imdur), Nitrostat if needed, Omeprazole (Prilosec)  Stop taking aspirin as directed by your Dr.  Stop taking BC's, Goody's, Herbal medications, Fish Oil, Herbal medications, Vitamins, Ibuprofen, Advil, Motrin, Aleve   Do not wear jewelry, make-up or nail polish.  Do not wear lotions, powders, or perfumes, or deoderant.  Do not shave 48 hours prior to surgery.  Men may shave face and neck.  Do not bring valuables to the hospital.  [facility] is not responsible for any belongings or valuables.  Contacts, dentures or bridgework may not be worn into surgery.  Leave your suitcase in the car.  After surgery it may be brought to your room.  For patients admitted to the hospital, discharge time will be determined by your treatment team.  Patients discharged the day of surgery will not be allowed to drive home.    Special instructions:  [facility] - Preparing for Surgery  Before surgery, you can play an important role.  Because skin is not sterile, your skin needs to be as free of germs as possible.  You can reduce the number of germs on you skin by washing with CHG (chlorahexidine gluconate) soap before surgery.  CHG is an antiseptic cleaner  which kills germs and bonds with the skin to continue killing germs even after washing.  Please DO NOT use if you have an allergy to CHG or antibacterial soaps.  If your skin becomes reddened/irritated stop using the CHG and inform your nurse when you arrive at Short Stay.  Do not shave (including legs and underarms) for at least 48 hours prior to the first CHG shower.  You may shave your face.  Please follow these instructions carefully:   1.  Shower with CHG Soap the night before surgery and the                                morning of Surgery.  2.  If you choose to wash your hair, wash your hair first as usual with your       normal shampoo.  3.  After you shampoo, rinse your hair and body thoroughly to remove the                      Shampoo.  4.  Use CHG as you would any other liquid soap.  You can apply chg directly       to the skin and wash gently with scrungie or a clean washcloth.  5.  Apply the CHG Soap to your body ONLY FROM THE NECK DOWN.        Do not use on open wounds or open sores.  Avoid  contact with your eyes,       ears, mouth and genitals (private parts).  Wash genitals (private parts)       with your normal soap.  6.  Wash thoroughly, paying special attention to the area where your surgery        will be performed.  7.  Thoroughly rinse your body with warm water from the neck down.  8.  DO NOT shower/wash with your normal soap after using and rinsing off       the CHG Soap.  9.  Pat yourself dry with a clean towel.            10.  Wear clean pajamas.            11.  Place clean sheets on your bed the night of your first shower and do not        sleep with pets.  Day of Surgery  Do not apply any lotions/deoderants the morning of surgery.  Please wear clean clothes to the hospital/surgery center.     Please read over the following fact sheets that you were given. Pain Booklet, Coughing and Deep Breathing and Surgical Site Infection Prevention

## 2017-01-20 ENCOUNTER — Ambulatory Visit (HOSPITAL_COMMUNITY)
Admission: RE | Admit: 2017-01-20 | Discharge: 2017-01-20 | Disposition: A | Discharge status: Home / Self Care | Payer: BLUE CROSS/BLUE SHIELD | Source: Ambulatory Visit | Attending: Anesthesiology | Admitting: Anesthesiology

## 2017-01-20 ENCOUNTER — Encounter (HOSPITAL_COMMUNITY)
Admission: RE | Admit: 2017-01-20 | Discharge: 2017-01-20 | Disposition: A | Discharge status: Home / Self Care | Payer: BLUE CROSS/BLUE SHIELD | Source: Ambulatory Visit | Attending: Otolaryngology | Admitting: Otolaryngology

## 2017-01-20 ENCOUNTER — Encounter (HOSPITAL_COMMUNITY): Payer: Self-pay

## 2017-01-20 DIAGNOSIS — Z01818 Encounter for other preprocedural examination: Secondary | ICD-10-CM

## 2017-01-20 DIAGNOSIS — R918 Other nonspecific abnormal finding of lung field: Secondary | ICD-10-CM | POA: Insufficient documentation

## 2017-01-20 LAB — BASIC METABOLIC PANEL
Anion gap: 11 (ref 5–15)
BUN: 47 mg/dL — ABNORMAL HIGH (ref 6–20)
CO2: 24 mmol/L (ref 22–32)
Calcium: 8.9 mg/dL (ref 8.9–10.3)
Chloride: 103 mmol/L (ref 101–111)
Creatinine, Ser: 4.87 mg/dL — ABNORMAL HIGH (ref 0.61–1.24)
GFR calc Af Amer: 14 mL/min — ABNORMAL LOW (ref >60)
GFR calc non Af Amer: 12 mL/min — ABNORMAL LOW (ref >60)
Glucose, Bld: 98 mg/dL (ref 65–99)
Potassium: 4.4 mmol/L (ref 3.5–5.1)
Sodium: 138 mmol/L (ref 135–145)

## 2017-01-20 LAB — CBC
HCT: 37.7 % — ABNORMAL LOW (ref 39.0–52.0)
Hemoglobin: 11.6 g/dL — ABNORMAL LOW (ref 13.0–17.0)
MCH: 26.6 pg (ref 26.0–34.0)
MCHC: 30.8 g/dL (ref 30.0–36.0)
MCV: 86.5 fL (ref 78.0–100.0)
Platelets: 194 10*3/uL (ref 150–400)
RBC: 4.36 MIL/uL (ref 4.22–5.81)
RDW: 15.1 % (ref 11.5–15.5)
WBC: 6 10*3/uL (ref 4.0–10.5)

## 2017-01-20 NOTE — Progress Notes (Addendum)
PCP - Velora Heckler  Cardiologist - Dr. Francis Dowse- Clearance note in Epic  Chest x-ray - 01/20/17  EKG - 12/19/16 (E)  Stress Test - 10/19/16 (CE)  ECHO - 10/19/16 (CE)  Cardiac Cath - Denies  Sleep Study - Yes CPAP - Yes- Does not use anymore  Pt sts he has stopped taking Aspirin since 01/15/17. Pt has chronic kidney disease, with an AV Fistula to the right arm, but they have not used the fistula as yet. Pt sts they are just monitoring his kidney function. Chart will be given to anesthesia for review due to medical history.  Pt denies having chest pain, sob, or fever at this time. All instructions explained to the pt, with a verbal understanding of the material. Pt agrees to go over the instructions while at home for a better understanding. The opportunity to ask questions was provided.

## 2017-01-21 NOTE — Progress Notes (Signed)
Anesthesia Chart Review:  Pt is a 56 year old male scheduled for thyroidectomy on 01/22/2017 with Melida Quitter, MD  - PCP is Mauricio Po, NP - Cardiologist is Minus Breeding, MD who cleared pt for surgery at last office visit 01/14/17.  - Nephrologist is Jamal Maes, MD  PMH includes:  CHF, cardiomyopathy, OSA, anemia, CKD (stage V, not yet on dialysis; being evaluated for transplant at [facility]). Current smoker. BMI 38.5. S/p AV fistula creation 03/25/16  Medications include: ASA 81mg , carvedilol, fenofibrate, lasix, imdur, nitrofurantoin, prilosec, potassium.  Pt stopped ASA 01/15/17.   BP (!) 122/53   Pulse 70   Temp 36.9 C   Resp 18   Ht 6\' 2"  (1.88 m)   Wt 299 lb 12.8 oz (136 kg)   SpO2 100%   BMI 38.49 kg/m   Preoperative labs reviewed.   - Cr 4.87, BUN 47. This is consistent with prior results in Epic and from nephrologist's office (notes in media tab)  Nuclear stress test 10/19/16 (care everywhere):  - No inducible ischemia. - Normal left ventricular function. 63%  Echo 10/19/16 (care everywhere):  - LV size is normal. Normal LV wall thickness. LV systolic function is normal. Ejection fraction = 55-60%. LV wall motion is normal. LV filling pattern is prolonged relaxation. - RV is normal in size and function. - There is no significant valvular stenosis or regurgitation - Mild pulmonary hypertension.  If no changes, I anticipate pt can proceed with surgery as scheduled.   Willeen Cass, FNP-BC [facility] Short Stay Surgical Center/Anesthesiology Phone: [phone] 01/21/2017 9:40 AM

## 2017-01-22 ENCOUNTER — Encounter (HOSPITAL_COMMUNITY)
Admission: RE | Disposition: A | Discharge status: Home / Self Care | Payer: Self-pay | Source: Ambulatory Visit | Attending: Otolaryngology

## 2017-01-22 ENCOUNTER — Encounter (HOSPITAL_COMMUNITY): Payer: Self-pay | Admitting: Anesthesiology

## 2017-01-22 ENCOUNTER — Ambulatory Visit (HOSPITAL_COMMUNITY): Payer: BLUE CROSS/BLUE SHIELD | Admitting: Emergency Medicine

## 2017-01-22 ENCOUNTER — Observation Stay (HOSPITAL_COMMUNITY)
Admission: RE | Admit: 2017-01-22 | Discharge: 2017-01-23 | Disposition: A | Discharge status: Home / Self Care | Payer: BLUE CROSS/BLUE SHIELD | Source: Ambulatory Visit | Attending: Otolaryngology | Admitting: Otolaryngology

## 2017-01-22 ENCOUNTER — Ambulatory Visit (HOSPITAL_COMMUNITY): Payer: BLUE CROSS/BLUE SHIELD | Admitting: Anesthesiology

## 2017-01-22 DIAGNOSIS — I509 Heart failure, unspecified: Secondary | ICD-10-CM | POA: Insufficient documentation

## 2017-01-22 DIAGNOSIS — G4733 Obstructive sleep apnea (adult) (pediatric): Secondary | ICD-10-CM | POA: Insufficient documentation

## 2017-01-22 DIAGNOSIS — Z6839 Body mass index (BMI) 39.0-39.9, adult: Secondary | ICD-10-CM | POA: Insufficient documentation

## 2017-01-22 DIAGNOSIS — E669 Obesity, unspecified: Secondary | ICD-10-CM | POA: Diagnosis not present

## 2017-01-22 DIAGNOSIS — F17210 Nicotine dependence, cigarettes, uncomplicated: Secondary | ICD-10-CM | POA: Insufficient documentation

## 2017-01-22 DIAGNOSIS — Z933 Colostomy status: Secondary | ICD-10-CM | POA: Diagnosis not present

## 2017-01-22 DIAGNOSIS — I130 Hypertensive heart and chronic kidney disease with heart failure and stage 1 through stage 4 chronic kidney disease, or unspecified chronic kidney disease: Secondary | ICD-10-CM | POA: Diagnosis not present

## 2017-01-22 DIAGNOSIS — Z8249 Family history of ischemic heart disease and other diseases of the circulatory system: Secondary | ICD-10-CM | POA: Diagnosis not present

## 2017-01-22 DIAGNOSIS — C73 Malignant neoplasm of thyroid gland: Secondary | ICD-10-CM | POA: Diagnosis present

## 2017-01-22 DIAGNOSIS — K219 Gastro-esophageal reflux disease without esophagitis: Secondary | ICD-10-CM | POA: Diagnosis not present

## 2017-01-22 DIAGNOSIS — E041 Nontoxic single thyroid nodule: Secondary | ICD-10-CM | POA: Diagnosis present

## 2017-01-22 DIAGNOSIS — E0789 Other specified disorders of thyroid: Secondary | ICD-10-CM | POA: Diagnosis not present

## 2017-01-22 DIAGNOSIS — N184 Chronic kidney disease, stage 4 (severe): Secondary | ICD-10-CM | POA: Diagnosis not present

## 2017-01-22 DIAGNOSIS — Z7982 Long term (current) use of aspirin: Secondary | ICD-10-CM | POA: Diagnosis not present

## 2017-01-22 DIAGNOSIS — M109 Gout, unspecified: Secondary | ICD-10-CM | POA: Diagnosis not present

## 2017-01-22 DIAGNOSIS — Z79899 Other long term (current) drug therapy: Secondary | ICD-10-CM | POA: Insufficient documentation

## 2017-01-22 HISTORY — PX: THYROIDECTOMY: SHX17

## 2017-01-22 LAB — POCT I-STAT 4, (NA,K, GLUC, HGB,HCT)
Glucose, Bld: 94 mg/dL (ref 65–99)
HCT: 36 % — ABNORMAL LOW (ref 39.0–52.0)
Hemoglobin: 12.2 g/dL — ABNORMAL LOW (ref 13.0–17.0)
Potassium: 4.2 mmol/L (ref 3.5–5.1)
Sodium: 142 mmol/L (ref 135–145)

## 2017-01-22 LAB — GLUCOSE, CAPILLARY: Glucose-Capillary: 94 mg/dL (ref 65–99)

## 2017-01-22 SURGERY — THYROIDECTOMY
Anesthesia: General | Site: Neck | Laterality: Left

## 2017-01-22 MED ORDER — HYDROCODONE-ACETAMINOPHEN 5-325 MG PO TABS: 2.0000 | ORAL_TABLET | Freq: Four times a day (QID) | ORAL | 0 refills | Status: DC | PRN

## 2017-01-22 MED ORDER — PANTOPRAZOLE SODIUM 40 MG PO TBEC
40.0000 mg | DELAYED_RELEASE_TABLET | Freq: Every day | ORAL | Status: DC
  Administered 2017-01-23: 40 mg via ORAL
  Filled 2017-01-22 (×2): qty 1

## 2017-01-22 MED ORDER — CHLORHEXIDINE GLUCONATE CLOTH 2 % EX PADS: 6.0000 | MEDICATED_PAD | Freq: Once | CUTANEOUS | Status: DC

## 2017-01-22 MED ORDER — ACETAMINOPHEN 500 MG PO TABS: 1000.0000 mg | ORAL_TABLET | Freq: Four times a day (QID) | ORAL | Status: DC | PRN

## 2017-01-22 MED ORDER — ASPIRIN EC 81 MG PO TBEC
81.0000 mg | DELAYED_RELEASE_TABLET | Freq: Every day | ORAL | Status: DC
  Administered 2017-01-22 – 2017-01-23 (×2): 81 mg via ORAL
  Filled 2017-01-22 (×2): qty 1

## 2017-01-22 MED ORDER — DEXTROSE 5 % IV SOLN
3.0000 g | INTRAVENOUS | Status: AC
  Administered 2017-01-22: 3 g via INTRAVENOUS
  Filled 2017-01-22: qty 3000

## 2017-01-22 MED ORDER — HEMOSTATIC AGENTS (NO CHARGE) OPTIME
TOPICAL | Status: DC | PRN
  Administered 2017-01-22: 1 via TOPICAL

## 2017-01-22 MED ORDER — DEXAMETHASONE SODIUM PHOSPHATE 4 MG/ML IJ SOLN
INTRAMUSCULAR | Status: DC | PRN
  Administered 2017-01-22: 5 mg via INTRAVENOUS

## 2017-01-22 MED ORDER — DICYCLOMINE HCL 10 MG PO CAPS
10.0000 mg | ORAL_CAPSULE | Freq: Two times a day (BID) | ORAL | Status: DC
  Administered 2017-01-22 – 2017-01-23 (×2): 10 mg via ORAL
  Filled 2017-01-22 (×3): qty 1

## 2017-01-22 MED ORDER — FUROSEMIDE 80 MG PO TABS
80.0000 mg | ORAL_TABLET | Freq: Two times a day (BID) | ORAL | Status: DC
  Administered 2017-01-22 – 2017-01-23 (×2): 80 mg via ORAL
  Filled 2017-01-22 (×2): qty 1

## 2017-01-22 MED ORDER — LIDOCAINE-EPINEPHRINE 1 %-1:100000 IJ SOLN
INTRAMUSCULAR | Status: AC
  Filled 2017-01-22: qty 1

## 2017-01-22 MED ORDER — NITROGLYCERIN 0.4 MG SL SUBL: 0.4000 mg | SUBLINGUAL_TABLET | SUBLINGUAL | Status: DC | PRN

## 2017-01-22 MED ORDER — ISOSORBIDE MONONITRATE ER 60 MG PO TB24
60.0000 mg | ORAL_TABLET | Freq: Every day | ORAL | Status: DC
  Administered 2017-01-22 – 2017-01-23 (×2): 60 mg via ORAL
  Filled 2017-01-22 (×2): qty 1

## 2017-01-22 MED ORDER — PHENYLEPHRINE HCL 10 MG/ML IJ SOLN
INTRAVENOUS | Status: DC | PRN
  Administered 2017-01-22: 50 ug/min via INTRAVENOUS

## 2017-01-22 MED ORDER — GABAPENTIN 100 MG PO CAPS
100.0000 mg | ORAL_CAPSULE | Freq: Every day | ORAL | Status: DC
  Administered 2017-01-22: 100 mg via ORAL
  Filled 2017-01-22: qty 1

## 2017-01-22 MED ORDER — PROPOFOL 10 MG/ML IV BOLUS
INTRAVENOUS | Status: AC
  Filled 2017-01-22: qty 20

## 2017-01-22 MED ORDER — SUCCINYLCHOLINE CHLORIDE 200 MG/10ML IV SOSY
PREFILLED_SYRINGE | INTRAVENOUS | Status: AC
  Filled 2017-01-22: qty 10

## 2017-01-22 MED ORDER — ROCURONIUM BROMIDE 100 MG/10ML IV SOLN
INTRAVENOUS | Status: DC | PRN
  Administered 2017-01-22: 30 mg via INTRAVENOUS

## 2017-01-22 MED ORDER — EPHEDRINE SULFATE 50 MG/ML IJ SOLN
INTRAMUSCULAR | Status: DC | PRN
  Administered 2017-01-22: 10 mg via INTRAVENOUS

## 2017-01-22 MED ORDER — ONDANSETRON HCL 4 MG PO TABS: 4.0000 mg | ORAL_TABLET | ORAL | Status: DC | PRN

## 2017-01-22 MED ORDER — ONDANSETRON HCL 4 MG/2ML IJ SOLN: 4.0000 mg | INTRAMUSCULAR | Status: DC | PRN

## 2017-01-22 MED ORDER — LIDOCAINE-EPINEPHRINE 1 %-1:100000 IJ SOLN
INTRAMUSCULAR | Status: DC | PRN
  Administered 2017-01-22: 7 mL

## 2017-01-22 MED ORDER — PROPOFOL 10 MG/ML IV BOLUS
INTRAVENOUS | Status: DC | PRN
  Administered 2017-01-22: 150 mg via INTRAVENOUS

## 2017-01-22 MED ORDER — MIDAZOLAM HCL 2 MG/2ML IJ SOLN
INTRAMUSCULAR | Status: AC
  Filled 2017-01-22: qty 2

## 2017-01-22 MED ORDER — HYDROMORPHONE HCL 1 MG/ML IJ SOLN: 0.2500 mg | INTRAMUSCULAR | Status: DC | PRN

## 2017-01-22 MED ORDER — DEXAMETHASONE SODIUM PHOSPHATE 10 MG/ML IJ SOLN
INTRAMUSCULAR | Status: AC
  Filled 2017-01-22: qty 1

## 2017-01-22 MED ORDER — FENTANYL CITRATE (PF) 100 MCG/2ML IJ SOLN
INTRAMUSCULAR | Status: DC | PRN
  Administered 2017-01-22: 150 ug via INTRAVENOUS

## 2017-01-22 MED ORDER — CARVEDILOL 25 MG PO TABS
25.0000 mg | ORAL_TABLET | Freq: Two times a day (BID) | ORAL | Status: DC
  Administered 2017-01-22 – 2017-01-23 (×3): 25 mg via ORAL
  Filled 2017-01-22 (×3): qty 1

## 2017-01-22 MED ORDER — HYDROCODONE-ACETAMINOPHEN 5-325 MG PO TABS
1.0000 | ORAL_TABLET | ORAL | Status: DC | PRN
  Administered 2017-01-22 (×2): 2 via ORAL
  Administered 2017-01-23: 1 via ORAL
  Filled 2017-01-22 (×3): qty 2

## 2017-01-22 MED ORDER — NITROFURANTOIN MACROCRYSTAL 25 MG PO CAPS: 25.0000 mg | ORAL_CAPSULE | Freq: Four times a day (QID) | ORAL | Status: DC

## 2017-01-22 MED ORDER — MORPHINE SULFATE (PF) 4 MG/ML IV SOLN: 2.0000 mg | INTRAVENOUS | Status: DC | PRN

## 2017-01-22 MED ORDER — 0.9 % SODIUM CHLORIDE (POUR BTL) OPTIME
TOPICAL | Status: DC | PRN
  Administered 2017-01-22: 1000 mL

## 2017-01-22 MED ORDER — ALLOPURINOL 300 MG PO TABS
300.0000 mg | ORAL_TABLET | Freq: Every day | ORAL | Status: DC
  Administered 2017-01-23: 300 mg via ORAL
  Filled 2017-01-22 (×2): qty 1

## 2017-01-22 MED ORDER — SUCCINYLCHOLINE CHLORIDE 20 MG/ML IJ SOLN
INTRAMUSCULAR | Status: DC | PRN
  Administered 2017-01-22: 100 mg via INTRAVENOUS

## 2017-01-22 MED ORDER — SODIUM CHLORIDE 0.9 % IV SOLN
INTRAVENOUS | Status: DC
  Administered 2017-01-22: 09:00:00 via INTRAVENOUS

## 2017-01-22 MED ORDER — FENTANYL CITRATE (PF) 250 MCG/5ML IJ SOLN
INTRAMUSCULAR | Status: AC
  Filled 2017-01-22: qty 5

## 2017-01-22 MED ORDER — FENOFIBRATE 54 MG PO TABS
54.0000 mg | ORAL_TABLET | Freq: Every day | ORAL | Status: DC
  Administered 2017-01-22 – 2017-01-23 (×2): 54 mg via ORAL
  Filled 2017-01-22 (×2): qty 1

## 2017-01-22 MED ORDER — LIDOCAINE 2% (20 MG/ML) 5 ML SYRINGE
INTRAMUSCULAR | Status: DC | PRN
  Administered 2017-01-22: 60 mg via INTRAVENOUS

## 2017-01-22 MED ORDER — ONDANSETRON HCL 4 MG/2ML IJ SOLN
INTRAMUSCULAR | Status: AC
  Filled 2017-01-22: qty 2

## 2017-01-22 MED ORDER — DEXTROSE-NACL 5-0.45 % IV SOLN
INTRAVENOUS | Status: DC
Start: 2017-01-22 — End: 2017-01-23
  Administered 2017-01-22 – 2017-01-23 (×2): via INTRAVENOUS

## 2017-01-22 MED ORDER — ONDANSETRON HCL 4 MG/2ML IJ SOLN
INTRAMUSCULAR | Status: DC | PRN
  Administered 2017-01-22: 4 mg via INTRAVENOUS

## 2017-01-22 MED ORDER — LIDOCAINE 2% (20 MG/ML) 5 ML SYRINGE
INTRAMUSCULAR | Status: AC
  Filled 2017-01-22: qty 5

## 2017-01-22 MED ORDER — POTASSIUM CHLORIDE CRYS ER 20 MEQ PO TBCR
20.0000 meq | EXTENDED_RELEASE_TABLET | Freq: Two times a day (BID) | ORAL | Status: DC
  Administered 2017-01-22 – 2017-01-23 (×2): 20 meq via ORAL
  Filled 2017-01-22 (×3): qty 1

## 2017-01-22 MED ORDER — NEOSTIGMINE METHYLSULFATE 10 MG/10ML IV SOLN
INTRAVENOUS | Status: DC | PRN
  Administered 2017-01-22: 4 mg via INTRAVENOUS

## 2017-01-22 MED ORDER — GLYCOPYRROLATE 0.2 MG/ML IJ SOLN
INTRAMUSCULAR | Status: DC | PRN
  Administered 2017-01-22: 0.3 mg via INTRAVENOUS

## 2017-01-22 MED ORDER — MIDAZOLAM HCL 5 MG/5ML IJ SOLN
INTRAMUSCULAR | Status: DC | PRN
  Administered 2017-01-22: 2 mg via INTRAVENOUS

## 2017-01-22 SURGICAL SUPPLY — 40 items
ADH SKN CLS APL DERMABOND .7 (GAUZE/BANDAGES/DRESSINGS) ×1
ATTRACTOMAT 16X20 MAGNETIC DRP (DRAPES) ×2 IMPLANT
CANISTER SUCT 3000ML PPV (MISCELLANEOUS) ×3 IMPLANT
CLEANER TIP ELECTROSURG 2X2 (MISCELLANEOUS) ×3 IMPLANT
CONT SPEC 4OZ CLIKSEAL STRL BL (MISCELLANEOUS) ×2 IMPLANT
CORDS BIPOLAR (ELECTRODE) ×3 IMPLANT
COVER SURGICAL LIGHT HANDLE (MISCELLANEOUS) ×3 IMPLANT
DERMABOND ADVANCED (GAUZE/BANDAGES/DRESSINGS) ×2
DERMABOND ADVANCED .7 DNX12 (GAUZE/BANDAGES/DRESSINGS) ×1 IMPLANT
DRAIN JACKSON RD 7FR 3/32 (WOUND CARE) IMPLANT
DRAIN SNY 10 ROU (WOUND CARE) IMPLANT
ELECT COATED BLADE 2.86 ST (ELECTRODE) ×3 IMPLANT
ELECT REM PT RETURN 9FT ADLT (ELECTROSURGICAL) ×3
ELECTRODE REM PT RTRN 9FT ADLT (ELECTROSURGICAL) ×1 IMPLANT
EVACUATOR SILICONE 100CC (DRAIN) ×3 IMPLANT
FORCEPS BIPOLAR SPETZLER 8 1.0 (NEUROSURGERY SUPPLIES) ×3 IMPLANT
GAUZE SPONGE 4X4 16PLY XRAY LF (GAUZE/BANDAGES/DRESSINGS) ×3 IMPLANT
GLOVE BIO SURGEON STRL SZ7.5 (GLOVE) ×3 IMPLANT
GLOVE BIOGEL PI IND STRL 7.0 (GLOVE) IMPLANT
GLOVE BIOGEL PI INDICATOR 7.0 (GLOVE) ×2
GLOVE INDICATOR 7.0 STRL GRN (GLOVE) ×2 IMPLANT
GLOVE SURG SS PI 7.0 STRL IVOR (GLOVE) ×2 IMPLANT
GOWN STRL REUS W/ TWL LRG LVL3 (GOWN DISPOSABLE) ×2 IMPLANT
GOWN STRL REUS W/TWL LRG LVL3 (GOWN DISPOSABLE) ×12
KIT BASIN OR (CUSTOM PROCEDURE TRAY) ×3 IMPLANT
KIT ROOM TURNOVER OR (KITS) ×3 IMPLANT
NDL HYPO 25GX1X1/2 BEV (NEEDLE) ×1 IMPLANT
NEEDLE HYPO 25GX1X1/2 BEV (NEEDLE) ×3 IMPLANT
NS IRRIG 1000ML POUR BTL (IV SOLUTION) ×3 IMPLANT
PAD ARMBOARD 7.5X6 YLW CONV (MISCELLANEOUS) ×6 IMPLANT
PENCIL BUTTON HOLSTER BLD 10FT (ELECTRODE) ×3 IMPLANT
SHEARS HARMONIC 9CM CVD (BLADE) ×3 IMPLANT
STAPLER VISISTAT 35W (STAPLE) ×3 IMPLANT
SUT ETHILON 2 0 FS 18 (SUTURE) ×3 IMPLANT
SUT SILK 3 0 REEL (SUTURE) ×3 IMPLANT
SUT VIC AB 3-0 SH 27 (SUTURE) ×3
SUT VIC AB 3-0 SH 27X BRD (SUTURE) ×1 IMPLANT
SUT VICRYL 4-0 PS2 18IN ABS (SUTURE) ×3 IMPLANT
TOWEL OR 17X24 6PK STRL BLUE (TOWEL DISPOSABLE) ×3 IMPLANT
TRAY ENT MC OR (CUSTOM PROCEDURE TRAY) ×3 IMPLANT

## 2017-01-22 NOTE — Transfer of Care (Signed)
Immediate Anesthesia Transfer of Care Note  Patient: Craig Flynn  Procedure(s) Performed: THYROIDECTOMY (Left Neck)  Patient Location: PACU  Anesthesia Type:General  Level of Consciousness: awake and alert   Airway & Oxygen Therapy: Patient Spontanous Breathing and Patient connected to face mask oxygen  Post-op Assessment: Report given to RN and Post -op Vital signs reviewed and stable  Post vital signs: Reviewed and stable  Last Vitals:  Vitals:   01/22/17 0817 01/22/17 1210  BP: 119/62   Pulse: 70   Temp: 36.8 C (P) 36.4 C  SpO2: 100%     Last Pain:  Vitals:   01/22/17 0817  TempSrc: Oral      Patients Stated Pain Goal: 3 (59/16/38 4665)  Complications: No apparent anesthesia complications

## 2017-01-22 NOTE — Anesthesia Preprocedure Evaluation (Addendum)
Anesthesia Evaluation  Patient identified by MRN, date of birth, ID band Patient awake    Reviewed: Allergy & Precautions, H&P , NPO status , Patient's Chart, lab work & pertinent test results, reviewed documented beta blocker date and time   Airway Mallampati: III  TM Distance: >3 FB Neck ROM: Full    Dental no notable dental hx. (+) Teeth Intact, Dental Advisory Given   Pulmonary sleep apnea , Current Smoker,    Pulmonary exam normal breath sounds clear to auscultation       Cardiovascular hypertension, Pt. on medications and Pt. on home beta blockers +CHF   Rhythm:Regular Rate:Normal     Neuro/Psych  Headaches, negative psych ROS   GI/Hepatic Neg liver ROS, GERD  Medicated and Controlled,  Endo/Other  negative endocrine ROS  Renal/GU CRFRenal disease  negative genitourinary   Musculoskeletal  (+) Arthritis , Osteoarthritis,    Abdominal   Peds  Hematology negative hematology ROS (+) anemia ,   Anesthesia Other Findings   Reproductive/Obstetrics negative OB ROS                            Anesthesia Physical Anesthesia Plan  ASA: III  Anesthesia Plan: General   Post-op Pain Management:    Induction: Intravenous  PONV Risk Score and Plan: 2 and Ondansetron, Dexamethasone and Midazolam  Airway Management Planned: Oral ETT  Additional Equipment:   Intra-op Plan:   Post-operative Plan: Extubation in OR  Informed Consent: I have reviewed the patients History and Physical, chart, labs and discussed the procedure including the risks, benefits and alternatives for the proposed anesthesia with the patient or authorized representative who has indicated his/her understanding and acceptance.   Dental advisory given  Plan Discussed with: CRNA  Anesthesia Plan Comments:         Anesthesia Quick Evaluation

## 2017-01-22 NOTE — H&P (Signed)
Craig Flynn is an 56 y.o. male.   Chief Complaint: Thyroid nodule HPI: 56 year old male with left thyroid nodule.  Molecular testing suspicious for cancer.  Presents for surgical management.  Past Medical History:  Diagnosis Date  . Anemia 06/2015  . Arthritis   . Cardiomyopathy ([place])    a. 10/2013: EF reduced to 35-40% b. 09/2014: EF improved to 55-60%, Grade 1 DD noted.  . Chest pain 06/2015  . CHF (congestive heart failure) ([place])   . CKD (chronic kidney disease), stage IV ([place])   . GERD (gastroesophageal reflux disease)   . Gout   . Hypertension   . Obesity   . OSA (obstructive sleep apnea)    a. on CPAP  . 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 Sleep apnea    not wearing c-pap now, needs new slep study per pt.  . Tuberculosis    back in the 1980's while the pt was in the service.  . Tubular adenoma of colon 10/2015  . Wears glasses     Past Surgical History:  Procedure Laterality Date  . AV FISTULA PLACEMENT Left 03/25/2016   Procedure: Creation of Left Arm ARTERIOVENOUS (AV) FISTULA;  Surgeon: Elam Dutch, MD;  Location: [facility];  Service: Vascular;  Laterality: Left;  . CHOLECYSTECTOMY    . COLONOSCOPY    . UMBILICAL HERNIA REPAIR    . wisdom teeth extractions      Family History  Problem Relation Age of Onset  . Stroke Mother   . Pneumonia Father        died of Pneumonia x3  . Kidney disease Maternal Grandmother   . Hypertension Maternal Grandmother   . Healthy Maternal Grandfather   . Healthy Paternal Grandmother   . Healthy Paternal Grandfather   . CAD Neg Hx   . Colon cancer Neg Hx   . Esophageal cancer Neg Hx   . Pancreatic cancer Neg Hx   . Prostate cancer Neg Hx   . Stomach cancer Neg Hx   . Rectal cancer Neg Hx    Social History:  reports that he has been smoking Cigarettes.  He has a 15.00 pack-year smoking history. He has never used smokeless tobacco. He reports that he does not  drink alcohol or use drugs.  Allergies: No Known Allergies  Medications Prior to Admission  Medication Sig Dispense Refill  . acetaminophen (TYLENOL) 500 MG tablet Take 1,000 mg by mouth every 6 (six) hours as needed for mild pain or headache.     . allopurinol (ZYLOPRIM) 300 MG tablet take 1 tablet by mouth once daily 30 tablet 3  . aspirin EC 81 MG tablet Take 81 mg by mouth daily.    . carvedilol (COREG) 25 MG tablet take 1 tablet by mouth twice a day 60 tablet 0  . dicyclomine (BENTYL) 10 MG capsule Take 10 mg by mouth 2 (two) times daily.   0  . fenofibrate 54 MG tablet take 1 tablet by mouth once daily 30 tablet 6  . furosemide (LASIX) 80 MG tablet Take 1 tablet (80 mg total) by mouth 2 (two) times daily. 60 tablet 1  . gabapentin (NEURONTIN) 100 MG capsule take 1 capsule by mouth at bedtime 30 capsule 1  . isosorbide mononitrate (IMDUR) 30 MG 24 hr tablet take 2 tablets by mouth once daily 60 tablet 1  . nitrofurantoin (MACRODANTIN) 25 MG capsule Take  25 mg by mouth 4 (four) times daily.    . nitroGLYCERIN (NITROSTAT) 0.4 MG SL tablet Place 0.4 mg under the tongue every 5 (five) minutes as needed for chest pain.    Marland Kitchen omeprazole (PRILOSEC) 40 MG capsule take 1 capsule by mouth once daily 30 capsule 5  . potassium chloride SA (K-DUR,KLOR-CON) 20 MEQ tablet Take 20 mEq by mouth 2 (two) times daily.      Results for orders placed or performed during the hospital encounter of 01/22/17 (from the past 48 hour(s))  I-STAT 4, (NA,K, GLUC, HGB,HCT)     Status: Abnormal   Collection Time: 01/22/17  8:55 AM  Result Value Ref Range   Sodium 142 135 - 145 mmol/L   Potassium 4.2 3.5 - 5.1 mmol/L   Glucose, Bld 94 65 - 99 mg/dL   HCT 36.0 (L) 39.0 - 52.0 %   Hemoglobin 12.2 (L) 13.0 - 17.0 g/dL   No results found.  Review of Systems  All other systems reviewed and are negative.   Blood pressure 119/62, pulse 70, temperature 98.3 F (36.8 C), temperature source Oral, SpO2 100 %. Physical  Exam  Constitutional: He is oriented to person, place, and time. He appears well-developed and well-nourished. No distress.  HENT:  Head: Normocephalic and atraumatic.  Right Ear: External ear normal.  Left Ear: External ear normal.  Nose: Nose normal.  Mouth/Throat: Oropharynx is clear and moist.  Eyes: Pupils are equal, round, and reactive to light. Conjunctivae and EOM are normal.  Neck: Normal range of motion. Neck supple.  Left thyroid with subtle mass.  Cardiovascular: Normal rate.   Respiratory: Effort normal.  Musculoskeletal: Normal range of motion.  Neurological: He is alert and oriented to person, place, and time. No cranial nerve deficit.  Skin: Skin is warm and dry.  Psychiatric: He has a normal mood and affect. His behavior is normal. Judgment and thought content normal.     Assessment/Plan Left thyroid nodule To OR for left thyroid lobectomy, possible total.  Redmond Baseman, Brittan Mapel, MD 01/22/2017, 10:15 AM

## 2017-01-22 NOTE — Anesthesia Procedure Notes (Signed)
Procedure Name: Intubation Date/Time: 01/22/2017 10:43 AM Performed by: Lieutenant Diego Pre-anesthesia Checklist: Patient identified, Emergency Drugs available, Suction available and Patient being monitored Patient Re-evaluated:Patient Re-evaluated prior to induction Oxygen Delivery Method: Circle system utilized Preoxygenation: Pre-oxygenation with 100% oxygen Induction Type: IV induction Ventilation: Mask ventilation without difficulty Laryngoscope Size: Glidescope Grade View: Grade I Tube type: Oral Tube size: 8.0 mm Number of attempts: 1 Airway Equipment and Method: Stylet and Oral airway Placement Confirmation: ETT inserted through vocal cords under direct vision,  positive ETCO2 and breath sounds checked- equal and bilateral Secured at: 24 cm Tube secured with: Tape Dental Injury: Teeth and Oropharynx as per pre-operative assessment  Comments: elective glide used based on previus airway notes. Inserted blade carefully into oropharynx avoiding trauma, grade one view with glide, ett passes easily, +EtCO2, =BBS

## 2017-01-22 NOTE — Brief Op Note (Signed)
01/22/2017  12:05 PM  PATIENT:  Craig Flynn  56 y.o. male  PRE-OPERATIVE DIAGNOSIS:  THYROID CANCER  POST-OPERATIVE DIAGNOSIS:  THYROID CANCER  PROCEDURE:  Procedure(s): THYROIDECTOMY (Left)  SURGEON:  Surgeon(s) and Role:    * Melida Quitter, MD - Primary    * Marcellino, San Jetty, MD - Assisting  PHYSICIAN ASSISTANT:   ASSISTANTS: Marcellino   ANESTHESIA:   general  EBL:  Minimal   BLOOD ADMINISTERED:none  DRAINS: none   LOCAL MEDICATIONS USED:  LIDOCAINE   SPECIMEN:  Source of Specimen:  Left thyroid lobe  DISPOSITION OF SPECIMEN:  PATHOLOGY  COUNTS:  YES  TOURNIQUET:  * No tourniquets in log *  DICTATION: .Other Dictation: Dictation Number [id number]  PLAN OF CARE: Admit for overnight observation  PATIENT DISPOSITION:  PACU - hemodynamically stable.   Delay start of Pharmacological VTE agent (>24hrs) due to surgical blood loss or risk of bleeding: yes

## 2017-01-22 NOTE — Anesthesia Postprocedure Evaluation (Signed)
Anesthesia Post Note  Patient: Craig Flynn  Procedure(s) Performed: THYROIDECTOMY (Left Neck)     Patient location during evaluation: PACU Anesthesia Type: General Level of consciousness: awake and alert Pain management: pain level controlled Vital Signs Assessment: post-procedure vital signs reviewed and stable Respiratory status: spontaneous breathing, nonlabored ventilation and respiratory function stable Cardiovascular status: blood pressure returned to baseline and stable Postop Assessment: no apparent nausea or vomiting Anesthetic complications: no    Last Vitals:  Vitals:   01/22/17 1315 01/22/17 1326  BP: (!) 154/76   Pulse: 69 65  Resp: 19 15  Temp:  (!) 36.1 C  SpO2: 93% 95%    Last Pain:  Vitals:   01/22/17 0817  TempSrc: Oral                 Travus Oren,W. EDMOND

## 2017-01-23 ENCOUNTER — Encounter (HOSPITAL_COMMUNITY): Payer: Self-pay | Admitting: Otolaryngology

## 2017-01-23 DIAGNOSIS — C73 Malignant neoplasm of thyroid gland: Secondary | ICD-10-CM | POA: Diagnosis not present

## 2017-01-23 NOTE — Discharge Summary (Signed)
Physician Discharge Summary  Patient ID: Craig Flynn MRN: [id number] DOB/AGE: 10/26/1960 56 y.o.  Admit date: 01/22/2017 Discharge date: 01/23/2017  Admission Diagnoses: Thyroid mass  Discharge Diagnoses: Same Active Problems:   Thyroid nodule   Discharged Condition: good  Hospital Course: Patient underwent thyroidectomy.  He did well postoperatively.  He has no complaints.  Voice is normal.  He is taking fluids and solids well.  He only had the left side of the thyroid removed so he does not appear and should not have any calcium issues.  He was discharged to follow-up in 1 week  Consults: None  Significant Diagnostic Studies: None  Treatments: Thyroidectomy  Discharge Exam: Blood pressure (!) 144/64, pulse 61, temperature (!) 97.5 F (36.4 C), temperature source Oral, resp. rate 20, height 6\' 2"  (1.88 m), weight (!) 140.2 kg (309 lb), SpO2 96 %. Awake and alert.  No problems or complaints.  Wound looks excellent.  No evidence of hematoma or infection.  Voice sounds normal.  Heart is regular.  Lungs are clear.  Abdomen is soft.  Extremities no tenderness or swelling.  Disposition: 01-Home or Self Care  Discharge Instructions    Diet - low sodium heart healthy    Complete by:  As directed    Discharge instructions    Complete by:  As directed    Avoid strenuous activity.  Normal diet.  OK to allow incision to get wet, gently pat dry.  Do not apply ointment to the incision.   Increase activity slowly    Complete by:  As directed       Follow-up Information    Melida Quitter, MD. Schedule an appointment as soon as possible for a visit in 1 week.   Specialty:  Otolaryngology Why:  For wound re-check Contact information: [address] [place] [address] [phone]           Signed: Melissa Montane 01/23/2017, 9:46 AM

## 2017-01-25 NOTE — Op Note (Signed)
NAME:  Craig Flynn, Craig Flynn                 ACCOUNT NO.:  [id number]  MEDICAL RECORD NO.:  [id number]  LOCATION:                                 FACILITY:  PHYSICIAN:  Onnie Graham, MD     DATE OF BIRTH:  September 26, 1960  DATE OF PROCEDURE:  01/22/2017 DATE OF DISCHARGE:                              OPERATIVE REPORT   PREOPERATIVE DIAGNOSIS:  Left thyroid nodule.  POSTOPERATIVE DIAGNOSIS:  Left thyroid nodule.  PROCEDURE:  Left thyroid lobectomy.  SURGEON:  Leane Para. Redmond Baseman, M.D.  ASSISTANT:  Dr. Alver Fisher.  ANESTHESIA:  General endotracheal anesthesia.  COMPLICATIONS:  None.  INDICATION:  The patient is a 56 year old African-American male with a left thyroid nodule that was intermediate on FNA and suspicious on molecular testing.  He presents to the operating room for surgical management.  FINDINGS:  The left thyroid lobe was essentially made up of the nodule, which was round and contained well in the gland.  The left lobe was sent for frozen section during the surgery and came back as a follicular lesion.  DESCRIPTION OF PROCEDURE:  The patient was identified in the holding room, and informed consent having been obtained including discussion of risks, benefits, and alternatives, the patient was brought to the operative suite and put on the operating table in supine position. Anesthesia was induced, and the patient was intubated by the Anesthesia team with a GlideScope without difficulty.  The patient was given intravenous antibiotics during the case.  The eyes were taped closed and a shoulder roll was placed.  The neck incision was marked with a marking pen and injected with 1% lidocaine with 1:100,000 epinephrine.  The neck was prepped and draped in a sterile fashion.  An incision was made with a #15 blade scalpel and extended through the subcutaneous and platysma layer using electrocautery.  The midline raphe of the strap muscles was divided using Bovie electrocautery  and the left-sided muscles were retracted exposing the thyroid lobe.  The lobe was retracted inferiorly and medially and tissues dissected around the periphery staying on the capsule.  The superior pedicle was divided using the Harmonic scalpel. Dissection was then continued along the lateral part of the lobe and then inferiorly dividing the inferior pedicle.  Careful dissection was then performed around the depth of the lobe, staying along the capsule and dividing tissues leading into Berry's ligament.  This was also then divided.  The midline of the isthmus was then divided using the Harmonic scalpel, and the lobe was removed and passed to nursing for pathology. The wound was copiously irrigated with saline and no additional bleeding was seen, even with Valsalva given.  Two pieces of Surgicel were then laid down in the depth of the wound.  The self-retaining retractor that was added during the case was removed.  The platysma layer was then closed with 3-0 Vicryl suture in a simple running fashion.  Subcutaneous layer was closed with 4-0 Vicryl suture in a simple interrupted fashion.  After the frozen section returned, skin glue was added to the incision.  The drapes were removed.  The patient was cleaned off.  He was returned to  Anesthesia for wake-up, was extubated, and moved to the recovery room in stable condition.     Onnie Graham, MD   ______________________________ Onnie Graham, MD    DDB/MEDQ  D:  01/22/2017  T:  01/23/2017  Job:  [id number]  cc:   Onnie Graham, MD

## 2017-02-01 DIAGNOSIS — C73 Malignant neoplasm of thyroid gland: Secondary | ICD-10-CM | POA: Diagnosis present

## 2017-02-03 ENCOUNTER — Other Ambulatory Visit: Payer: Self-pay | Admitting: Otolaryngology

## 2017-02-23 ENCOUNTER — Other Ambulatory Visit: Payer: Self-pay | Admitting: *Deleted

## 2017-02-23 NOTE — Telephone Encounter (Signed)
PT MUST ESTAB APPT  W/NEW PROVIDER FOR REFILLS.../L;MB

## 2017-03-03 ENCOUNTER — Telehealth: Payer: Self-pay | Admitting: Internal Medicine

## 2017-03-03 NOTE — Telephone Encounter (Signed)
Copied from [place] [id number]. Topic: Appointment Scheduling - Prior Auth Required for Appointment >> Mar 02, 2017  1:52 PM Ahmed Prima L wrote: Pt called and stated her used to see Terri Piedra, he called and wanted to set up an appt with Sharlet Salina as that is what he said is on his insurance card. I advised him she is not taking new pts and I offered to place him with [facility] & he kept on about he needs to see [employer] of his insurance card. Please call him @ [phone].   Route to department's PEC pool.  >> Mar 02, 2017  2:20 PM Mickie Kay wrote: Hold until office reopens

## 2017-03-03 NOTE — Telephone Encounter (Signed)
Would you be willing to see this patient to establish care/transfer from [place]?

## 2017-03-04 NOTE — Telephone Encounter (Signed)
Not taking new patients sorry.

## 2017-03-05 NOTE — Telephone Encounter (Signed)
Called patient and let him know that Dr Sharlet Salina would not be able to see him. I explained why Dr Nathanial Millman name would be on his insurance card. He was under the impression that she was his doctor but I did make him aware that Marya Amsler was always his PCP. He is scheduled to see Hollie Beach in February.

## 2017-03-09 ENCOUNTER — Other Ambulatory Visit: Payer: Self-pay | Admitting: Cardiology

## 2017-03-12 ENCOUNTER — Other Ambulatory Visit: Payer: Self-pay | Admitting: Cardiology

## 2017-03-12 ENCOUNTER — Other Ambulatory Visit: Payer: Self-pay | Admitting: *Deleted

## 2017-03-12 MED ORDER — ISOSORBIDE MONONITRATE ER 30 MG PO TB24: 60.0000 mg | ORAL_TABLET | Freq: Every day | ORAL | 1 refills | Status: DC

## 2017-03-25 ENCOUNTER — Other Ambulatory Visit: Payer: Self-pay

## 2017-03-25 ENCOUNTER — Encounter (HOSPITAL_COMMUNITY): Payer: Self-pay | Admitting: *Deleted

## 2017-03-25 ENCOUNTER — Other Ambulatory Visit: Payer: Self-pay | Admitting: *Deleted

## 2017-03-25 MED ORDER — ALLOPURINOL 300 MG PO TABS: ORAL_TABLET | ORAL | 1 refills | Status: DC

## 2017-03-25 MED ORDER — DEXTROSE 5 % IV SOLN
3.0000 g | INTRAVENOUS | Status: AC
  Administered 2017-03-26: 3 g via INTRAVENOUS
  Filled 2017-03-25: qty 3

## 2017-03-25 NOTE — Progress Notes (Addendum)
Craig Flynn reports that he has had chest pain a few times in the past 3- 4 days, Pain is mid chest, " sharp at first , then sharp dull, pain score at the highest 5."  Patient states that he is is lightheaded with the chest pain, last time patient had chest pain was 1/2 /18.  Patient states he takes 2 Tylenol when he has the pain and it goes away, within  the hour.  Patient states that he is not usually doing anything when he has chest pain. Patient denies shortness of breath.  Patient states the chest pain is not like any he had in the past.  I instructed patient that if he has chest pain tonight to go to the ED.  I spoke to Dr Marcie Bal about patient's cardiac history and recent chest pain, patient to be evaluated in am.

## 2017-03-26 ENCOUNTER — Observation Stay (HOSPITAL_COMMUNITY)
Admission: RE | Admit: 2017-03-26 | Discharge: 2017-03-27 | Disposition: A | Discharge status: Home / Self Care | Payer: BLUE CROSS/BLUE SHIELD | Source: Ambulatory Visit | Attending: Otolaryngology | Admitting: Otolaryngology

## 2017-03-26 ENCOUNTER — Ambulatory Visit (HOSPITAL_COMMUNITY): Payer: BLUE CROSS/BLUE SHIELD | Admitting: Anesthesiology

## 2017-03-26 ENCOUNTER — Encounter (HOSPITAL_COMMUNITY)
Admission: RE | Disposition: A | Discharge status: Home / Self Care | Payer: Self-pay | Source: Ambulatory Visit | Attending: Otolaryngology

## 2017-03-26 DIAGNOSIS — R0600 Dyspnea, unspecified: Secondary | ICD-10-CM | POA: Diagnosis not present

## 2017-03-26 DIAGNOSIS — E890 Postprocedural hypothyroidism: Secondary | ICD-10-CM | POA: Insufficient documentation

## 2017-03-26 DIAGNOSIS — E669 Obesity, unspecified: Secondary | ICD-10-CM | POA: Insufficient documentation

## 2017-03-26 DIAGNOSIS — Z8249 Family history of ischemic heart disease and other diseases of the circulatory system: Secondary | ICD-10-CM | POA: Insufficient documentation

## 2017-03-26 DIAGNOSIS — I509 Heart failure, unspecified: Secondary | ICD-10-CM | POA: Insufficient documentation

## 2017-03-26 DIAGNOSIS — Z823 Family history of stroke: Secondary | ICD-10-CM | POA: Diagnosis not present

## 2017-03-26 DIAGNOSIS — E049 Nontoxic goiter, unspecified: Secondary | ICD-10-CM | POA: Diagnosis not present

## 2017-03-26 DIAGNOSIS — G4730 Sleep apnea, unspecified: Secondary | ICD-10-CM | POA: Insufficient documentation

## 2017-03-26 DIAGNOSIS — K219 Gastro-esophageal reflux disease without esophagitis: Secondary | ICD-10-CM | POA: Insufficient documentation

## 2017-03-26 DIAGNOSIS — Z7982 Long term (current) use of aspirin: Secondary | ICD-10-CM | POA: Diagnosis not present

## 2017-03-26 DIAGNOSIS — F17210 Nicotine dependence, cigarettes, uncomplicated: Secondary | ICD-10-CM | POA: Diagnosis not present

## 2017-03-26 DIAGNOSIS — N184 Chronic kidney disease, stage 4 (severe): Secondary | ICD-10-CM | POA: Insufficient documentation

## 2017-03-26 DIAGNOSIS — Z8611 Personal history of tuberculosis: Secondary | ICD-10-CM | POA: Insufficient documentation

## 2017-03-26 DIAGNOSIS — I130 Hypertensive heart and chronic kidney disease with heart failure and stage 1 through stage 4 chronic kidney disease, or unspecified chronic kidney disease: Secondary | ICD-10-CM | POA: Insufficient documentation

## 2017-03-26 DIAGNOSIS — C73 Malignant neoplasm of thyroid gland: Secondary | ICD-10-CM | POA: Diagnosis not present

## 2017-03-26 DIAGNOSIS — M109 Gout, unspecified: Secondary | ICD-10-CM | POA: Diagnosis not present

## 2017-03-26 DIAGNOSIS — Z86010 Personal history of colonic polyps: Secondary | ICD-10-CM | POA: Insufficient documentation

## 2017-03-26 DIAGNOSIS — Z841 Family history of disorders of kidney and ureter: Secondary | ICD-10-CM | POA: Diagnosis not present

## 2017-03-26 DIAGNOSIS — Z9049 Acquired absence of other specified parts of digestive tract: Secondary | ICD-10-CM | POA: Insufficient documentation

## 2017-03-26 DIAGNOSIS — I429 Cardiomyopathy, unspecified: Secondary | ICD-10-CM | POA: Insufficient documentation

## 2017-03-26 DIAGNOSIS — Z85850 Personal history of malignant neoplasm of thyroid: Secondary | ICD-10-CM | POA: Diagnosis not present

## 2017-03-26 DIAGNOSIS — D34 Benign neoplasm of thyroid gland: Secondary | ICD-10-CM | POA: Diagnosis not present

## 2017-03-26 HISTORY — PX: THYROIDECTOMY: SHX17

## 2017-03-26 HISTORY — DX: Malignant (primary) neoplasm, unspecified: C80.1

## 2017-03-26 HISTORY — DX: Dyspnea, unspecified: R06.00

## 2017-03-26 LAB — BASIC METABOLIC PANEL
Anion gap: 9 (ref 5–15)
BUN: 59 mg/dL — ABNORMAL HIGH (ref 6–20)
CO2: 23 mmol/L (ref 22–32)
Calcium: 9.2 mg/dL (ref 8.9–10.3)
Chloride: 108 mmol/L (ref 101–111)
Creatinine, Ser: 4.89 mg/dL — ABNORMAL HIGH (ref 0.61–1.24)
GFR calc Af Amer: 14 mL/min — ABNORMAL LOW (ref >60)
GFR calc non Af Amer: 12 mL/min — ABNORMAL LOW (ref >60)
Glucose, Bld: 99 mg/dL (ref 65–99)
Potassium: 4.2 mmol/L (ref 3.5–5.1)
Sodium: 140 mmol/L (ref 135–145)

## 2017-03-26 LAB — CBC
HCT: 37.5 % — ABNORMAL LOW (ref 39.0–52.0)
Hemoglobin: 11.4 g/dL — ABNORMAL LOW (ref 13.0–17.0)
MCH: 26.2 pg (ref 26.0–34.0)
MCHC: 30.4 g/dL (ref 30.0–36.0)
MCV: 86.2 fL (ref 78.0–100.0)
Platelets: 188 K/uL (ref 150–400)
RBC: 4.35 MIL/uL (ref 4.22–5.81)
RDW: 14.4 % (ref 11.5–15.5)
WBC: 4.7 K/uL (ref 4.0–10.5)

## 2017-03-26 SURGERY — THYROIDECTOMY
Anesthesia: General | Site: Neck | Laterality: Right

## 2017-03-26 MED ORDER — FENOFIBRATE 54 MG PO TABS
54.0000 mg | ORAL_TABLET | Freq: Every day | ORAL | Status: DC
  Filled 2017-03-26: qty 1

## 2017-03-26 MED ORDER — POLYSACCHARIDE IRON COMPLEX 150 MG PO CAPS
150.0000 mg | ORAL_CAPSULE | Freq: Two times a day (BID) | ORAL | Status: DC
  Administered 2017-03-26: 150 mg via ORAL
  Filled 2017-03-26: qty 1

## 2017-03-26 MED ORDER — LIDOCAINE-EPINEPHRINE 1 %-1:100000 IJ SOLN
INTRAMUSCULAR | Status: AC
  Filled 2017-03-26: qty 1

## 2017-03-26 MED ORDER — GABAPENTIN 100 MG PO CAPS
100.0000 mg | ORAL_CAPSULE | Freq: Every day | ORAL | Status: DC
  Administered 2017-03-26: 100 mg via ORAL
  Filled 2017-03-26: qty 1

## 2017-03-26 MED ORDER — HYDROCODONE-ACETAMINOPHEN 5-325 MG PO TABS: 1.0000 | ORAL_TABLET | Freq: Four times a day (QID) | ORAL | 0 refills | Status: DC | PRN

## 2017-03-26 MED ORDER — HYDROCODONE-ACETAMINOPHEN 5-325 MG PO TABS
1.0000 | ORAL_TABLET | ORAL | Status: DC | PRN
  Administered 2017-03-26 – 2017-03-27 (×3): 2 via ORAL
  Filled 2017-03-26 (×3): qty 2

## 2017-03-26 MED ORDER — PROPOFOL 10 MG/ML IV BOLUS
INTRAVENOUS | Status: DC | PRN
  Administered 2017-03-26: 200 mg via INTRAVENOUS

## 2017-03-26 MED ORDER — CALCIUM CARBONATE-VITAMIN D 500-200 MG-UNIT PO TABS
2.0000 | ORAL_TABLET | Freq: Three times a day (TID) | ORAL | Status: DC
  Administered 2017-03-26: 2 via ORAL
  Filled 2017-03-26 (×2): qty 2

## 2017-03-26 MED ORDER — CHLORHEXIDINE GLUCONATE CLOTH 2 % EX PADS: 6.0000 | MEDICATED_PAD | Freq: Once | CUTANEOUS | Status: DC

## 2017-03-26 MED ORDER — HEMOSTATIC AGENTS (NO CHARGE) OPTIME
TOPICAL | Status: DC | PRN
  Administered 2017-03-26 (×2): 1 via TOPICAL

## 2017-03-26 MED ORDER — ONDANSETRON HCL 4 MG/2ML IJ SOLN
INTRAMUSCULAR | Status: DC | PRN
  Administered 2017-03-26: 4 mg via INTRAVENOUS

## 2017-03-26 MED ORDER — ALLOPURINOL 300 MG PO TABS
300.0000 mg | ORAL_TABLET | Freq: Every day | ORAL | Status: DC
  Filled 2017-03-26: qty 1

## 2017-03-26 MED ORDER — FUROSEMIDE 80 MG PO TABS
80.0000 mg | ORAL_TABLET | Freq: Two times a day (BID) | ORAL | Status: DC
  Administered 2017-03-26: 80 mg via ORAL
  Filled 2017-03-26: qty 1

## 2017-03-26 MED ORDER — KCL IN DEXTROSE-NACL 20-5-0.45 MEQ/L-%-% IV SOLN
INTRAVENOUS | Status: DC
  Administered 2017-03-26 – 2017-03-27 (×2): via INTRAVENOUS
  Filled 2017-03-26 (×2): qty 1000

## 2017-03-26 MED ORDER — ROCURONIUM BROMIDE 100 MG/10ML IV SOLN
INTRAVENOUS | Status: DC | PRN
  Administered 2017-03-26: 50 mg via INTRAVENOUS

## 2017-03-26 MED ORDER — CARVEDILOL 25 MG PO TABS
25.0000 mg | ORAL_TABLET | Freq: Two times a day (BID) | ORAL | Status: DC
  Administered 2017-03-26: 25 mg via ORAL
  Filled 2017-03-26 (×2): qty 1

## 2017-03-26 MED ORDER — MIDAZOLAM HCL 2 MG/2ML IJ SOLN
INTRAMUSCULAR | Status: AC
  Filled 2017-03-26: qty 2

## 2017-03-26 MED ORDER — HYDROMORPHONE HCL 1 MG/ML IJ SOLN
INTRAMUSCULAR | Status: AC
  Filled 2017-03-26: qty 1

## 2017-03-26 MED ORDER — LIDOCAINE HCL (CARDIAC) 20 MG/ML IV SOLN
INTRAVENOUS | Status: DC | PRN
  Administered 2017-03-26: 80 mg via INTRAVENOUS

## 2017-03-26 MED ORDER — MORPHINE SULFATE (PF) 4 MG/ML IV SOLN
2.0000 mg | INTRAVENOUS | Status: DC | PRN
  Administered 2017-03-26 (×2): 4 mg via INTRAVENOUS
  Filled 2017-03-26 (×2): qty 1

## 2017-03-26 MED ORDER — ROCURONIUM BROMIDE 10 MG/ML (PF) SYRINGE
PREFILLED_SYRINGE | INTRAVENOUS | Status: AC
  Filled 2017-03-26: qty 5

## 2017-03-26 MED ORDER — LEVOTHYROXINE SODIUM 200 MCG PO TABS: 200.0000 ug | ORAL_TABLET | Freq: Every day | ORAL | 11 refills | Status: DC

## 2017-03-26 MED ORDER — MIDAZOLAM HCL 5 MG/5ML IJ SOLN
INTRAMUSCULAR | Status: DC | PRN
  Administered 2017-03-26: 2 mg via INTRAVENOUS

## 2017-03-26 MED ORDER — PHENYLEPHRINE HCL 10 MG/ML IJ SOLN
INTRAVENOUS | Status: DC | PRN
  Administered 2017-03-26: 25 ug/min via INTRAVENOUS

## 2017-03-26 MED ORDER — LEVOTHYROXINE SODIUM 100 MCG PO TABS: 200.0000 ug | ORAL_TABLET | Freq: Every day | ORAL | Status: DC

## 2017-03-26 MED ORDER — MEPERIDINE HCL 25 MG/ML IJ SOLN: 6.2500 mg | INTRAMUSCULAR | Status: DC | PRN

## 2017-03-26 MED ORDER — NEOSTIGMINE METHYLSULFATE 10 MG/10ML IV SOLN
INTRAVENOUS | Status: DC | PRN
  Administered 2017-03-26: 4 mg via INTRAVENOUS

## 2017-03-26 MED ORDER — FENTANYL CITRATE (PF) 100 MCG/2ML IJ SOLN
INTRAMUSCULAR | Status: DC | PRN
  Administered 2017-03-26: 50 ug via INTRAVENOUS
  Administered 2017-03-26: 150 ug via INTRAVENOUS
  Administered 2017-03-26: 50 ug via INTRAVENOUS

## 2017-03-26 MED ORDER — LIDOCAINE 2% (20 MG/ML) 5 ML SYRINGE
INTRAMUSCULAR | Status: AC
  Filled 2017-03-26: qty 5

## 2017-03-26 MED ORDER — PROPOFOL 10 MG/ML IV BOLUS
INTRAVENOUS | Status: AC
  Filled 2017-03-26: qty 20

## 2017-03-26 MED ORDER — PANTOPRAZOLE SODIUM 40 MG PO TBEC
40.0000 mg | DELAYED_RELEASE_TABLET | Freq: Every day | ORAL | Status: DC
  Filled 2017-03-26: qty 1

## 2017-03-26 MED ORDER — ISOSORBIDE MONONITRATE ER 60 MG PO TB24
60.0000 mg | ORAL_TABLET | Freq: Every day | ORAL | Status: DC
  Filled 2017-03-26: qty 1

## 2017-03-26 MED ORDER — NITROGLYCERIN 0.4 MG SL SUBL: 0.4000 mg | SUBLINGUAL_TABLET | SUBLINGUAL | Status: DC | PRN

## 2017-03-26 MED ORDER — GLYCOPYRROLATE 0.2 MG/ML IJ SOLN
INTRAMUSCULAR | Status: DC | PRN
  Administered 2017-03-26: 0.6 mg via INTRAVENOUS

## 2017-03-26 MED ORDER — ASPIRIN EC 81 MG PO TBEC
81.0000 mg | DELAYED_RELEASE_TABLET | Freq: Every day | ORAL | Status: DC
  Administered 2017-03-26: 81 mg via ORAL
  Filled 2017-03-26 (×2): qty 1

## 2017-03-26 MED ORDER — HYDROMORPHONE HCL 1 MG/ML IJ SOLN
0.2500 mg | INTRAMUSCULAR | Status: DC | PRN
  Administered 2017-03-26 (×2): 0.5 mg via INTRAVENOUS

## 2017-03-26 MED ORDER — POTASSIUM CHLORIDE CRYS ER 20 MEQ PO TBCR
20.0000 meq | EXTENDED_RELEASE_TABLET | Freq: Two times a day (BID) | ORAL | Status: DC
  Administered 2017-03-26 (×2): 20 meq via ORAL
  Filled 2017-03-26 (×2): qty 1

## 2017-03-26 MED ORDER — CALCITRIOL 0.25 MCG PO CAPS
0.2500 ug | ORAL_CAPSULE | ORAL | Status: DC
  Filled 2017-03-26 (×2): qty 1

## 2017-03-26 MED ORDER — DEXAMETHASONE SODIUM PHOSPHATE 10 MG/ML IJ SOLN
INTRAMUSCULAR | Status: DC | PRN
  Administered 2017-03-26: 10 mg via INTRAVENOUS

## 2017-03-26 MED ORDER — LIDOCAINE-EPINEPHRINE 1 %-1:100000 IJ SOLN
INTRAMUSCULAR | Status: DC | PRN
  Administered 2017-03-26: 5 mL

## 2017-03-26 MED ORDER — SUCCINYLCHOLINE CHLORIDE 200 MG/10ML IV SOSY
PREFILLED_SYRINGE | INTRAVENOUS | Status: AC
  Filled 2017-03-26: qty 10

## 2017-03-26 MED ORDER — NEOSTIGMINE METHYLSULFATE 5 MG/5ML IV SOSY
PREFILLED_SYRINGE | INTRAVENOUS | Status: AC
  Filled 2017-03-26: qty 5

## 2017-03-26 MED ORDER — ONDANSETRON HCL 4 MG/2ML IJ SOLN: 4.0000 mg | Freq: Once | INTRAMUSCULAR | Status: DC | PRN

## 2017-03-26 MED ORDER — FENTANYL CITRATE (PF) 250 MCG/5ML IJ SOLN
INTRAMUSCULAR | Status: AC
  Filled 2017-03-26: qty 5

## 2017-03-26 MED ORDER — DICYCLOMINE HCL 10 MG PO CAPS
10.0000 mg | ORAL_CAPSULE | Freq: Three times a day (TID) | ORAL | Status: DC
  Administered 2017-03-26 (×2): 10 mg via ORAL
  Filled 2017-03-26 (×2): qty 1

## 2017-03-26 MED ORDER — CALCIUM CARBONATE-VITAMIN D 500-200 MG-UNIT PO TABS: 2.0000 | ORAL_TABLET | Freq: Three times a day (TID) | ORAL | 3 refills | Status: DC

## 2017-03-26 MED ORDER — 0.9 % SODIUM CHLORIDE (POUR BTL) OPTIME
TOPICAL | Status: DC | PRN
  Administered 2017-03-26: 1000 mL

## 2017-03-26 MED ORDER — SODIUM CHLORIDE 0.9 % IV SOLN
INTRAVENOUS | Status: DC
  Administered 2017-03-26 (×2): via INTRAVENOUS

## 2017-03-26 SURGICAL SUPPLY — 43 items
ADH SKN CLS APL DERMABOND .7 (GAUZE/BANDAGES/DRESSINGS) ×1
BLADE SURG 15 STRL LF DISP TIS (BLADE) IMPLANT
BLADE SURG 15 STRL SS (BLADE) ×3
CANISTER SUCT 3000ML PPV (MISCELLANEOUS) ×3 IMPLANT
CLEANER TIP ELECTROSURG 2X2 (MISCELLANEOUS) ×3 IMPLANT
CONT SPEC 4OZ CLIKSEAL STRL BL (MISCELLANEOUS) IMPLANT
CORDS BIPOLAR (ELECTRODE) ×3 IMPLANT
COVER SURGICAL LIGHT HANDLE (MISCELLANEOUS) ×3 IMPLANT
CRADLE DONUT ADULT HEAD (MISCELLANEOUS) ×2 IMPLANT
DERMABOND ADVANCED (GAUZE/BANDAGES/DRESSINGS) ×2
DERMABOND ADVANCED .7 DNX12 (GAUZE/BANDAGES/DRESSINGS) ×1 IMPLANT
DRAIN JACKSON RD 7FR 3/32 (WOUND CARE) IMPLANT
DRAIN SNY 10 ROU (WOUND CARE) IMPLANT
DRAPE HALF SHEET 40X57 (DRAPES) ×2 IMPLANT
ELECT COATED BLADE 2.86 ST (ELECTRODE) ×3 IMPLANT
ELECT REM PT RETURN 9FT ADLT (ELECTROSURGICAL) ×3
ELECTRODE REM PT RTRN 9FT ADLT (ELECTROSURGICAL) ×1 IMPLANT
EVACUATOR SILICONE 100CC (DRAIN) ×3 IMPLANT
FORCEPS BIPOLAR SPETZLER 8 1.0 (NEUROSURGERY SUPPLIES) ×3 IMPLANT
GAUZE SPONGE 4X4 16PLY XRAY LF (GAUZE/BANDAGES/DRESSINGS) ×3 IMPLANT
GLOVE BIO SURGEON STRL SZ7.5 (GLOVE) ×3 IMPLANT
GOWN STRL REUS W/ TWL LRG LVL3 (GOWN DISPOSABLE) ×2 IMPLANT
GOWN STRL REUS W/TWL LRG LVL3 (GOWN DISPOSABLE) ×9
HEMOSTAT SURGICEL .5X2 ABSORB (HEMOSTASIS) ×4 IMPLANT
KIT BASIN OR (CUSTOM PROCEDURE TRAY) ×3 IMPLANT
KIT ROOM TURNOVER OR (KITS) ×3 IMPLANT
LOCATOR NERVE 3 VOLT (DISPOSABLE) IMPLANT
NDL HYPO 25GX1X1/2 BEV (NEEDLE) ×1 IMPLANT
NEEDLE HYPO 25GX1X1/2 BEV (NEEDLE) ×3 IMPLANT
NS IRRIG 1000ML POUR BTL (IV SOLUTION) ×3 IMPLANT
PAD ARMBOARD 7.5X6 YLW CONV (MISCELLANEOUS) ×6 IMPLANT
PENCIL BUTTON HOLSTER BLD 10FT (ELECTRODE) ×3 IMPLANT
SHEARS HARMONIC 9CM CVD (BLADE) ×3 IMPLANT
SPONGE INTESTINAL PEANUT (DISPOSABLE) ×2 IMPLANT
STAPLER VISISTAT 35W (STAPLE) ×3 IMPLANT
SUT ETHILON 2 0 FS 18 (SUTURE) ×3 IMPLANT
SUT SILK 3 0 REEL (SUTURE) ×3 IMPLANT
SUT VIC AB 3-0 SH 27 (SUTURE) ×3
SUT VIC AB 3-0 SH 27X BRD (SUTURE) ×1 IMPLANT
SUT VICRYL 4-0 PS2 18IN ABS (SUTURE) ×3 IMPLANT
TOWEL OR 17X24 6PK STRL BLUE (TOWEL DISPOSABLE) ×3 IMPLANT
TRAY ENT MC OR (CUSTOM PROCEDURE TRAY) ×3 IMPLANT
TUBE FEEDING 10FR FLEXIFLO (MISCELLANEOUS) IMPLANT

## 2017-03-26 NOTE — Discharge Instructions (Signed)
Take the oscal with vitamin D two pills three times daily for one week, then decrease to two pills two times daily for the second week, then two pills once daily for the third week, then stop.

## 2017-03-26 NOTE — Op Note (Signed)
NAME:  Craig Flynn, Craig Flynn                      ACCOUNT NO.:  MEDICAL RECORD NO.:  [id number]  LOCATION:                                 FACILITY:  PHYSICIAN:  Onnie Graham, MD     DATE OF BIRTH:  03-Mar-1961  DATE OF PROCEDURE:  03/26/2017 DATE OF DISCHARGE:                              OPERATIVE REPORT   PREOPERATIVE DIAGNOSIS:  Follicular thyroid carcinoma.  POSTOPERATIVE DIAGNOSIS:  Follicular thyroid carcinoma.  PROCEDURE:  Completion thyroidectomy.  SURGEON:  Onnie Graham, MD.  ASSISTANT:  Jolene Provost, PA-C.  ANESTHESIA:  General endotracheal anesthesia.  COMPLICATIONS:  None.  INDICATION:  The patient is a 57 year old male, who underwent left thyroid lobectomy in early November for thyroid nodule.  Final pathology demonstrated follicular carcinoma measuring 2.8 cm.  He presents to the operating room for completion thyroidectomy.  FINDINGS:  The right thyroid lobe was fairly homogeneous for the most part, but had a cluster of 4 nodules extending posterior laterally.  The one of the parathyroid glands was identified during the case and kept in place.  The recurrent laryngeal nerve was not visualized during the case.  DESCRIPTION OF PROCEDURE:  The patient was identified in the holding room.  Informed consent having been obtained including discussion of risks, benefits, alternatives, the patient was brought to the operative suite and put on the operating table in supine position.  Anesthesia was induced and the patient was intubated by anesthesia team without difficulty.  The patient was given intravenous antibiotics during the case.  The eyes were taped closed and a shoulder roll was placed.  The neck was prepped and draped in sterile fashion.  The incision site was injected with 1% lidocaine with 1:100,000 epinephrine.  Incision was made through the previous thyroidectomy incision using a 15 blade scalpel and extended through subcutaneous tissues and platysma  muscle using Bovie electrocautery.  The midline raphe was identified and divided using the Bovie electrocautery.  Right-sided strap muscles were elevated off the underlying thyroid lobe and the thyroid lobe was then further exposed over the anterior and lateral surface.  Dissection was then performed inferiorly around the inferior margin of the thyroid gland, dividing tissues and vessels using the Harmonic scalpel.  The superior pedicle was then dissected and divided using the Harmonic scalpel.  Dissection was then performed around the lateral extent of the lobe while retracting it medially and staying along the thyroid capsule. This was done easily down to the posterior lateral region where nodular extension of the gland was identified.  Dissection was very carefully performed around this tissue, staying close to the tissue and carefully dissecting tissues using the Harmonic scalpel.  Ultimately, this was able to be dissected in a medial direction under the lobe toward berry's ligament where tissues were continued to be dissected and carefully divided along the capsule of the thyroid gland.  This extended then across the anterior surface of the trachea and the gland was ultimately fully removed and passed to nursing for pathology.  A superior parathyroid gland was identified during the dissection laterally and stayed intact.  The recurrent laryngeal nerve was not identified or visualized.  At this point, the site was copiously irrigated with saline.  Bleeding was controlled in a couple of spots using bipolar electrocautery.  The patient was given a Valsalva.  No additional bleeding was seen.  Surgicel fabric was placed down in the region of the superior pedicle.  The self-retaining retractor was removed and the strap muscles were closed in the midline with a single 3-0 Vicryl suture.  The platysma layer was closed in a simple interrupted fashion using 3-0 Vicryl suture.  Subcutaneous  layer was closed with 4-0 Vicryl suture in a simple and interrupted fashion.  The patient was cleaned off, and the skin was then closed with Dermabond.  The patient was returned to Anesthesia, awakened, extubated, and moved to the recovery room in stable condition.     Onnie Graham, MD     DDB/MEDQ  D:  03/26/2017  T:  03/26/2017  Job:  [id number]  cc:   Onnie Graham, MD

## 2017-03-26 NOTE — Anesthesia Postprocedure Evaluation (Signed)
Anesthesia Post Note  Patient: Craig Flynn  Procedure(s) Performed: COMPLETION OF THYROIDECTOMY (Right Neck)     Patient location during evaluation: PACU Anesthesia Type: General Level of consciousness: awake and alert Pain management: pain level controlled Vital Signs Assessment: post-procedure vital signs reviewed and stable Respiratory status: spontaneous breathing, nonlabored ventilation, respiratory function stable and patient connected to nasal cannula oxygen Cardiovascular status: blood pressure returned to baseline and stable Postop Assessment: no apparent nausea or vomiting Anesthetic complications: no    Last Vitals:  Vitals:   03/26/17 1100 03/26/17 1126  BP:  139/70  Pulse: 76 63  Resp: 12 11  Temp: 36.7 C   SpO2: 90% 94%    Last Pain:  Vitals:   03/26/17 1143  TempSrc:   PainSc: 5                  Kaleen Rochette DAVID

## 2017-03-26 NOTE — H&P (Signed)
Craig Flynn is an 57 y.o. male.   Chief Complaint: Thyroid cancer HPI: 57 year old male underwent left thyroid lobectomy in November and was found to have follicular carcinoma, 2.8 cm.  He presents for completion thyroidectomy.  Past Medical History:  Diagnosis Date  . Anemia 06/2015  . Arthritis    knee  . Cancer [facility])    thyroid cancer  . Cardiomyopathy ([place])    a. 10/2013: EF reduced to 35-40% b. 09/2014: EF improved to 55-60%, Grade 1 DD noted.  . Chest pain 06/2015  . CHF (congestive heart failure) ([place])   . CKD (chronic kidney disease), stage IV [facility])    sees  Dr Lorrene Reid  . Dyspnea    with exerion   . GERD (gastroesophageal reflux disease)   . Gout   . Hypertension   . Obesity   . Sleep apnea    not wearing c-pap now, needs new slep study per pt. (01/22/2017)  . Tuberculosis 1981   while the pt was in the service.  . Tubular adenoma of colon 10/2015  . Wears glasses     Past Surgical History:  Procedure Laterality Date  . AV FISTULA PLACEMENT Left 03/25/2016   Procedure: Creation of Left Arm ARTERIOVENOUS (AV) FISTULA;  Surgeon: Elam Dutch, MD;  Location: [facility] OR;  Service: Vascular;  Laterality: Left;  . COLONOSCOPY W/ BIOPSIES AND POLYPECTOMY     "no problem"  . LAPAROSCOPIC CHOLECYSTECTOMY    . THYROIDECTOMY  01/22/2017  . THYROIDECTOMY Left 01/22/2017   Procedure: THYROIDECTOMY;  Surgeon: Melida Quitter, MD;  Location: [place];  Service: ENT;  Laterality: Left;  . UMBILICAL HERNIA REPAIR    . WISDOM TOOTH EXTRACTION      Family History  Problem Relation Age of Onset  . Stroke Mother   . Pneumonia Father        died of Pneumonia x3  . Kidney disease Maternal Grandmother   . Hypertension Maternal Grandmother   . Healthy Maternal Grandfather   . Healthy Paternal Grandmother   . Healthy Paternal Grandfather   . CAD Neg Hx   . Colon cancer Neg Hx   . Esophageal cancer Neg Hx   . Pancreatic cancer Neg Hx   . Prostate cancer Neg Hx   . Stomach cancer Neg Hx    . Rectal cancer Neg Hx    Social History:  reports that he has been smoking cigarettes.  He has a 20.00 pack-year smoking history. he has never used smokeless tobacco. He reports that he drinks alcohol. He reports that he does not use drugs.  Allergies: No Known Allergies  Medications Prior to Admission  Medication Sig Dispense Refill  . acetaminophen (TYLENOL) 500 MG tablet Take 1,000 mg by mouth every 6 (six) hours as needed for mild pain or headache.     . allopurinol (ZYLOPRIM) 300 MG tablet Take 1 tablet by mouth daily 30 tablet 1  . aspirin EC 81 MG tablet Take 81 mg by mouth daily.    . calcitRIOL (ROCALTROL) 0.25 MCG capsule Take 0.25 mcg by mouth every Monday, Wednesday, and Friday.    . carvedilol (COREG) 25 MG tablet take 1 tablet by mouth twice a day (Patient taking differently: take 25 mg by mouth twice a day) 60 tablet 0  . dicyclomine (BENTYL) 10 MG capsule Take 10 mg by mouth 4 (four) times daily -  before meals and at bedtime.   0  . fenofibrate 54 MG tablet take 1  tablet by mouth once daily (Patient taking differently: take 54 mg by mouth once daily) 30 tablet 6  . furosemide (LASIX) 80 MG tablet Take 1 tablet (80 mg total) by mouth 2 (two) times daily. (Patient taking differently: Take 160 mg by mouth 2 (two) times daily. ) 60 tablet 1  . gabapentin (NEURONTIN) 100 MG capsule take 1 capsule by mouth at bedtime (Patient taking differently: Take 100 mg by mouth at bedtime) 30 capsule 1  . iron polysaccharides (NIFEREX) 150 MG capsule Take 150 mg by mouth 2 (two) times daily.    . isosorbide mononitrate (IMDUR) 30 MG 24 hr tablet Take 2 tablets (60 mg total) by mouth daily. Must keep scheduled appt for future reills 60 tablet 1  . nitroGLYCERIN (NITROSTAT) 0.4 MG SL tablet Place 0.4 mg under the tongue every 5 (five) minutes as needed for chest pain.    Marland Kitchen omeprazole (PRILOSEC) 40 MG capsule take 1 capsule by mouth once daily (Patient taking differently: take 40 mg by mouth once  daily) 30 capsule 5  . potassium chloride SA (K-DUR,KLOR-CON) 20 MEQ tablet Take 20 mEq by mouth 2 (two) times daily.    Marland Kitchen HYDROcodone-acetaminophen (NORCO/VICODIN) 5-325 MG tablet Take 2 tablets by mouth every 6 (six) hours as needed for moderate pain. (Patient not taking: Reported on 03/19/2017) 20 tablet 0    Results for orders placed or performed during the hospital encounter of 03/26/17 (from the past 48 hour(s))  Basic metabolic panel     Status: Abnormal   Collection Time: 03/26/17  6:51 AM  Result Value Ref Range   Sodium 140 135 - 145 mmol/L   Potassium 4.2 3.5 - 5.1 mmol/L   Chloride 108 101 - 111 mmol/L   CO2 23 22 - 32 mmol/L   Glucose, Bld 99 65 - 99 mg/dL   BUN 59 (H) 6 - 20 mg/dL   Creatinine, Ser 4.89 (H) 0.61 - 1.24 mg/dL   Calcium 9.2 8.9 - 10.3 mg/dL   GFR calc non Af Amer 12 (L) >60 mL/min   GFR calc Af Amer 14 (L) >60 mL/min    Comment: (NOTE) The eGFR has been calculated using the CKD EPI equation. This calculation has not been validated in all clinical situations. eGFR's persistently <60 mL/min signify possible Chronic Kidney Disease.    Anion gap 9 5 - 15  CBC     Status: Abnormal   Collection Time: 03/26/17  6:51 AM  Result Value Ref Range   WBC 4.7 4.0 - 10.5 K/uL   RBC 4.35 4.22 - 5.81 MIL/uL   Hemoglobin 11.4 (L) 13.0 - 17.0 g/dL   HCT 37.5 (L) 39.0 - 52.0 %   MCV 86.2 78.0 - 100.0 fL   MCH 26.2 26.0 - 34.0 pg   MCHC 30.4 30.0 - 36.0 g/dL   RDW 14.4 11.5 - 15.5 %   Platelets 188 150 - 400 K/uL   No results found.  Review of Systems  Cardiovascular: Positive for chest pain.  All other systems reviewed and are negative.   Blood pressure (!) 136/56, pulse 68, temperature 98.1 F (36.7 C), temperature source Oral, resp. rate 18, height 6' 2"  (1.88 m), weight (!) 304 lb (137.9 kg), SpO2 100 %. Physical Exam  Constitutional: He is oriented to person, place, and time. He appears well-developed and well-nourished. No distress.  HENT:  Head:  Normocephalic and atraumatic.  Right Ear: External ear normal.  Left Ear: External ear normal.  Nose: Nose normal.  Mouth/Throat: Oropharynx is clear and moist.  Eyes: Conjunctivae and EOM are normal. Pupils are equal, round, and reactive to light.  Neck: Normal range of motion. Neck supple.  Thyroidectomy scar  Cardiovascular: Normal rate.  Respiratory: Effort normal.  Musculoskeletal: Normal range of motion.  Neurological: He is alert and oriented to person, place, and time. No cranial nerve deficit.  Skin: Skin is warm and dry.  Psychiatric: He has a normal mood and affect. His behavior is normal. Judgment and thought content normal.     Assessment/Plan Follicular carcinoma To OR for completion thyroidectomy.  Melida Quitter, MD 03/26/2017, 8:59 AM

## 2017-03-26 NOTE — Anesthesia Preprocedure Evaluation (Addendum)
Anesthesia Evaluation  Patient identified by MRN, date of birth, ID band Patient awake    Reviewed: Allergy & Precautions, NPO status , Patient's Chart, lab work & pertinent test results  Airway Mallampati: II  TM Distance: >3 FB Neck ROM: Full    Dental  (+) Teeth Intact, Dental Advisory Given   Pulmonary sleep apnea , Current Smoker,    Pulmonary exam normal        Cardiovascular hypertension, Pt. on medications Normal cardiovascular exam     Neuro/Psych    GI/Hepatic GERD  Medicated and Controlled,  Endo/Other    Renal/GU Renal InsufficiencyRenal disease     Musculoskeletal   Abdominal   Peds  Hematology   Anesthesia Other Findings   Reproductive/Obstetrics                            Anesthesia Physical Anesthesia Plan  ASA: III  Anesthesia Plan: General   Post-op Pain Management:    Induction: Intravenous  PONV Risk Score and Plan: 1  Airway Management Planned: Oral ETT  Additional Equipment:   Intra-op Plan:   Post-operative Plan: Extubation in OR  Informed Consent: I have reviewed the patients History and Physical, chart, labs and discussed the procedure including the risks, benefits and alternatives for the proposed anesthesia with the patient or authorized representative who has indicated his/her understanding and acceptance.     Plan Discussed with: CRNA and Surgeon  Anesthesia Plan Comments:         Anesthesia Quick Evaluation

## 2017-03-26 NOTE — Anesthesia Procedure Notes (Addendum)
Procedure Name: Intubation Date/Time: 03/26/2017 9:18 AM Performed by: Lillia Abed, MD Pre-anesthesia Checklist: Patient identified, Emergency Drugs available, Suction available, Patient being monitored and Timeout performed Patient Re-evaluated:Patient Re-evaluated prior to induction Oxygen Delivery Method: Circle system utilized Preoxygenation: Pre-oxygenation with 100% oxygen Induction Type: IV induction and Cricoid Pressure applied Ventilation: Mask ventilation without difficulty Laryngoscope Size: Mac and 4 Grade View: Grade III Tube type: Oral Tube size: 7.5 mm Number of attempts: 2 Airway Equipment and Method: Stylet Placement Confirmation: ETT inserted through vocal cords under direct vision,  positive ETCO2 and breath sounds checked- equal and bilateral Secured at: 22 cm Tube secured with: Tape Dental Injury: Teeth and Oropharynx as per pre-operative assessment  Difficulty Due To: Difficulty was anticipated, Difficult Airway- due to dentition and Difficult Airway- due to limited oral opening Comments: DL x 1 by CRNA with view of epiglottis only.  DL x 1 by MDA, ETT passed easily.  +ETCO2 noted, placement verified.  Henderson Cloud, CRNA

## 2017-03-26 NOTE — Brief Op Note (Signed)
03/26/2017  10:44 AM  PATIENT:  Craig Flynn  57 y.o. male  PRE-OPERATIVE DIAGNOSIS:  FOLLICULAR CARCINOMA OF THYROID  POST-OPERATIVE DIAGNOSIS:  FOLLICULAR CARCINOMA OF THYROID  PROCEDURE:  Procedure(s): COMPLETION OF THYROIDECTOMY (Right)  SURGEON:  Surgeon(s) and Role:    * Melida Quitter, MD - Primary  PHYSICIAN ASSISTANT: Sallee Provencal  ASSISTANTS: none   ANESTHESIA:   general  EBL: Minimal  BLOOD ADMINISTERED:none  DRAINS: none   LOCAL MEDICATIONS USED:  LIDOCAINE   SPECIMEN:  Source of Specimen:  right thyroid lobe  DISPOSITION OF SPECIMEN:  PATHOLOGY  COUNTS:  YES  TOURNIQUET:  * No tourniquets in log *  DICTATION: .Other Dictation: Dictation Number [id number]  PLAN OF CARE: Admit for overnight observation  PATIENT DISPOSITION:  PACU - hemodynamically stable.   Delay start of Pharmacological VTE agent (>24hrs) due to surgical blood loss or risk of bleeding: yes

## 2017-03-26 NOTE — Transfer of Care (Signed)
Immediate Anesthesia Transfer of Care Note  Patient: Craig Flynn  Procedure(s) Performed: COMPLETION OF THYROIDECTOMY (Right Neck)  Patient Location: PACU  Anesthesia Type:General  Level of Consciousness: awake, alert  and oriented  Airway & Oxygen Therapy: Patient connected to face mask oxygen  Post-op Assessment: Post -op Vital signs reviewed and stable  Post vital signs: stable  Last Vitals:  Vitals:   03/26/17 0648  BP: (!) 136/56  Pulse: 68  Resp: 18  Temp: 36.7 C  SpO2: 100%    Last Pain:  Vitals:   03/26/17 0648  TempSrc: Oral      Patients Stated Pain Goal: 5 (75/64/33 2951)  Complications: No apparent anesthesia complications

## 2017-03-26 NOTE — Progress Notes (Signed)
Patient ID: Craig Flynn, male   DOB: January 18, 1961, 57 y.o.   MRN: [id number]  Doing well, awake and alert.  Voice is strong.  Incision clean dry and intact without hematoma.  He is having no trouble swallowing.  Continue overnight observation.  Check calcium in the morning.  Plan discharge tomorrow.

## 2017-03-27 ENCOUNTER — Encounter (HOSPITAL_COMMUNITY): Payer: Self-pay | Admitting: Otolaryngology

## 2017-03-27 DIAGNOSIS — M109 Gout, unspecified: Secondary | ICD-10-CM | POA: Diagnosis not present

## 2017-03-27 DIAGNOSIS — I509 Heart failure, unspecified: Secondary | ICD-10-CM | POA: Diagnosis not present

## 2017-03-27 DIAGNOSIS — R0600 Dyspnea, unspecified: Secondary | ICD-10-CM | POA: Diagnosis not present

## 2017-03-27 DIAGNOSIS — I130 Hypertensive heart and chronic kidney disease with heart failure and stage 1 through stage 4 chronic kidney disease, or unspecified chronic kidney disease: Secondary | ICD-10-CM | POA: Diagnosis not present

## 2017-03-27 DIAGNOSIS — E669 Obesity, unspecified: Secondary | ICD-10-CM | POA: Diagnosis not present

## 2017-03-27 DIAGNOSIS — C73 Malignant neoplasm of thyroid gland: Secondary | ICD-10-CM | POA: Diagnosis not present

## 2017-03-27 DIAGNOSIS — N184 Chronic kidney disease, stage 4 (severe): Secondary | ICD-10-CM | POA: Diagnosis not present

## 2017-03-27 DIAGNOSIS — K219 Gastro-esophageal reflux disease without esophagitis: Secondary | ICD-10-CM | POA: Diagnosis not present

## 2017-03-27 DIAGNOSIS — E049 Nontoxic goiter, unspecified: Secondary | ICD-10-CM | POA: Diagnosis not present

## 2017-03-27 LAB — CALCIUM: Calcium: 9 mg/dL (ref 8.9–10.3)

## 2017-03-27 NOTE — Discharge Summary (Signed)
Physician Discharge Summary  Patient ID: Craig Flynn MRN: [id number] DOB/AGE: 05-17-1960 57 y.o.  Admit date: 03/26/2017 Discharge date: 03/27/2017  Admission Diagnoses: Follicular thyroid carcinoma  Discharge Diagnoses:  Active Problems:   Follicular thyroid cancer ([place])   Discharged Condition: good  Hospital Course: No complications  Consults: none  Significant Diagnostic Studies: none  Treatments: surgery: Completion thyroidectomy  Discharge Exam: Blood pressure (!) 128/59, pulse (!) 52, temperature 97.9 F (36.6 C), temperature source Oral, resp. rate 16, height 6\' 2"  (1.88 m), weight (!) 137.9 kg (304 lb), SpO2 100 %. PHYSICAL EXAM: Awake and alert, eating breakfast, no trouble swallowing or breathing.  His voice is strong and normal.  Incision intact, clean and dry without hematoma.  Disposition: 01-Home or Self Care  Discharge Instructions    Diet - low sodium heart healthy   Complete by:  As directed    Diet - low sodium heart healthy   Complete by:  As directed    Discharge instructions   Complete by:  As directed    Avoid strenuous activity.  Normal diet.  OK to allow incision to get wet, gently pat dry.   Increase activity slowly   Complete by:  As directed    Increase activity slowly   Complete by:  As directed      Allergies as of 03/27/2017   No Known Allergies     Medication List    TAKE these medications   acetaminophen 500 MG tablet Commonly known as:  TYLENOL Take 1,000 mg by mouth every 6 (six) hours as needed for mild pain or headache.   allopurinol 300 MG tablet Commonly known as:  ZYLOPRIM Take 1 tablet by mouth daily   aspirin EC 81 MG tablet Take 81 mg by mouth daily.   calcitRIOL 0.25 MCG capsule Commonly known as:  ROCALTROL Take 0.25 mcg by mouth every Monday, Wednesday, and Friday.   carvedilol 25 MG tablet Commonly known as:  COREG take 1 tablet by mouth twice a day What changed:    how much to take  how to take  this  when to take this   dicyclomine 10 MG capsule Commonly known as:  BENTYL Take 10 mg by mouth 4 (four) times daily -  before meals and at bedtime.   fenofibrate 54 MG tablet take 1 tablet by mouth once daily What changed:    how much to take  how to take this  when to take this   furosemide 80 MG tablet Commonly known as:  LASIX Take 1 tablet (80 mg total) by mouth 2 (two) times daily. What changed:  how much to take   gabapentin 100 MG capsule Commonly known as:  NEURONTIN take 1 capsule by mouth at bedtime What changed:    how much to take  how to take this  when to take this   HYDROcodone-acetaminophen 5-325 MG tablet Commonly known as:  NORCO/VICODIN Take 1-2 tablets by mouth every 6 (six) hours as needed for moderate pain. What changed:  how much to take   iron polysaccharides 150 MG capsule Commonly known as:  NIFEREX Take 150 mg by mouth 2 (two) times daily.   isosorbide mononitrate 30 MG 24 hr tablet Commonly known as:  IMDUR Take 2 tablets (60 mg total) by mouth daily. Must keep scheduled appt for future reills   levothyroxine 200 MCG tablet Commonly known as:  SYNTHROID Take 1 tablet (200 mcg total) by mouth daily.   nitroGLYCERIN 0.4 MG  SL tablet Commonly known as:  NITROSTAT Place 0.4 mg under the tongue every 5 (five) minutes as needed for chest pain.   omeprazole 40 MG capsule Commonly known as:  PRILOSEC take 1 capsule by mouth once daily What changed:    how much to take  how to take this  when to take this   potassium chloride SA 20 MEQ tablet Commonly known as:  K-DUR,KLOR-CON Take 20 mEq by mouth 2 (two) times daily.      Follow-up Information    Melida Quitter, MD. Schedule an appointment as soon as possible for a visit in 2 weeks.   Specialty:  Otolaryngology Contact information: [address] [place] [address] [phone]           Signed: Izora Gala 03/27/2017, 8:39 AM

## 2017-03-29 ENCOUNTER — Other Ambulatory Visit: Payer: Self-pay

## 2017-03-29 MED ORDER — GABAPENTIN 100 MG PO CAPS: 100.0000 mg | ORAL_CAPSULE | Freq: Every day | ORAL | 0 refills | Status: DC

## 2017-04-19 ENCOUNTER — Encounter: Payer: Self-pay | Admitting: Nephrology

## 2017-04-19 ENCOUNTER — Encounter: Payer: Self-pay | Admitting: Family

## 2017-04-19 ENCOUNTER — Ambulatory Visit (INDEPENDENT_AMBULATORY_CARE_PROVIDER_SITE_OTHER): Payer: BLUE CROSS/BLUE SHIELD | Admitting: Family

## 2017-04-19 VITALS — BP 138/70 | HR 78 | Temp 98.5°F | Wt 308.0 lb

## 2017-04-19 DIAGNOSIS — R1084 Generalized abdominal pain: Secondary | ICD-10-CM | POA: Diagnosis not present

## 2017-04-19 MED ORDER — PANTOPRAZOLE SODIUM 40 MG PO TBEC: 40.0000 mg | DELAYED_RELEASE_TABLET | Freq: Every day | ORAL | 3 refills | Status: DC

## 2017-04-19 NOTE — Progress Notes (Signed)
Craig Flynn is a 57 y.o. male with the following history as recorded in Epic[facility]:  Patient Active Problem List   Diagnosis Date Noted  . 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 Thyroid nodule 01/22/2017  . Esophageal spasm 01/09/2016  . Lightheaded 01/09/2016  . Other headache syndrome 01/09/2016  . Ptosis 01/09/2016  . Rectal bleeding 09/10/2015  . Dizziness 08/27/2015  . Fullness of abdomen 08/27/2015  . Chronic systolic heart failure ([place]) 08/01/2015  . GERD (gastroesophageal reflux disease) 07/29/2015  . Pain in the chest   . CKD (chronic kidney disease)   . Osteoarthritis 05/03/2015  . Hyperlipidemia 02/26/2015  . Acute bronchitis 02/26/2015  . Diastolic dysfunction 35/57/3220  . Tobacco abuse 02/26/2015  . Atypical chest pain 02/25/2015  . Neck muscle spasm 01/15/2015  . OSA (obstructive sleep apnea) 12/27/2014  . Snoring 11/12/2014  . Routine general medical examination at a health care facility 11/12/2014  . Anemia in chronic kidney disease 11/12/2014  . Left knee pain 09/22/2014  . Chest pain 09/21/2014  . AKI (acute kidney injury) ([place]) 09/21/2014  . Systolic CHF ([place]) 25/42/7062  . Paresthesia of left leg 09/21/2014  . Acute systolic CHF (congestive heart failure) ([place]) 11/17/2013  . CKD (chronic kidney disease) stage 4, GFR 15-29 ml/min ([facility]) 11/17/2013  . Morbid obesity ([place]) 11/17/2013  . Diabetes mellitus ([place]) 12/12/2011  . Acute on chronic renal failure ([place]) 12/11/2011  . Hypokalemia 12/11/2011  . HTN (hypertension) 12/11/2011  . Dyslipidemia 12/31/2006  . Gout 12/31/2006  . Essential hypertension 12/31/2006  . 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 BACKACHE NOS 12/29/2006    Current Outpatient Medications  Medication Sig Dispense Refill  . acetaminophen (TYLENOL) 500 MG tablet Take 1,000 mg by mouth every 6 (six) hours as needed for mild pain or headache.     . allopurinol (ZYLOPRIM) 300 MG tablet Take 1 tablet by mouth  daily 30 tablet 1  . aspirin EC 81 MG tablet Take 81 mg by mouth daily.    . calcitRIOL (ROCALTROL) 0.25 MCG capsule Take 0.25 mcg by mouth every Monday, Wednesday, and Friday.    . carvedilol (COREG) 25 MG tablet take 1 tablet by mouth twice a day (Patient taking differently: take 25 mg by mouth twice a day) 60 tablet 0  . dicyclomine (BENTYL) 10 MG capsule Take 10 mg by mouth 4 (four) times daily -  before meals and at bedtime.   0  . fenofibrate 54 MG tablet take 1 tablet by mouth once daily (Patient taking differently: take 54 mg by mouth once daily) 30 tablet 6  . furosemide (LASIX) 80 MG tablet Take 1 tablet (80 mg total) by mouth 2 (two) times daily. (Patient taking differently: Take 160 mg by mouth 2 (two) times daily. ) 60 tablet 1  . gabapentin (NEURONTIN) 100 MG capsule Take 1 capsule (100 mg total) by mouth at bedtime. 30 capsule 0  . HYDROcodone-acetaminophen (NORCO/VICODIN) 5-325 MG tablet Take 1-2 tablets by mouth every 6 (six) hours as needed for moderate pain. 20 tablet 0  . iron polysaccharides (NIFEREX) 150 MG capsule Take 150 mg by mouth 2 (two) times daily.    . isosorbide mononitrate (IMDUR) 30 MG 24 hr tablet Take 2 tablets (60 mg total) by mouth daily. Must keep scheduled appt for future reills 60 tablet 1  . levothyroxine (SYNTHROID) 200 MCG tablet Take 1 tablet (200 mcg total) by mouth daily. 30 tablet 11  . nitroGLYCERIN (NITROSTAT) 0.4 MG SL  tablet Place 0.4 mg under the tongue every 5 (five) minutes as needed for chest pain.    . potassium chloride SA (K-DUR,KLOR-CON) 20 MEQ tablet Take 20 mEq by mouth 2 (two) times daily.    . pantoprazole (PROTONIX) 40 MG tablet Take 1 tablet (40 mg total) by mouth daily. 30 tablet 3   No current facility-administered medications for this visit.     Allergies: Patient has no known allergies.  Past Medical History:  Diagnosis Date  . Anemia 06/2015  . Arthritis    knee  . Cancer [facility])    thyroid cancer  . Cardiomyopathy ([place])     a. 10/2013: EF reduced to 35-40% b. 09/2014: EF improved to 55-60%, Grade 1 DD noted.  . Chest pain 06/2015  . CHF (congestive heart failure) ([place])   . CKD (chronic kidney disease), stage IV [facility])    sees  Dr Lorrene Reid  . Dyspnea    with exerion   . GERD (gastroesophageal reflux disease)   . Gout   . Hypertension   . Obesity   . Sleep apnea    not wearing c-pap now, needs new slep study per pt. (01/22/2017)  . Tuberculosis 1981   while the pt was in the service.  . Tubular adenoma of colon 10/2015  . Wears glasses     Past Surgical History:  Procedure Laterality Date  . AV FISTULA PLACEMENT Left 03/25/2016   Procedure: Creation of Left Arm ARTERIOVENOUS (AV) FISTULA;  Surgeon: Elam Dutch, MD;  [facility];  Service: Vascular;  Laterality: Left;  . COLONOSCOPY W/ BIOPSIES AND POLYPECTOMY     "no problem"  . LAPAROSCOPIC CHOLECYSTECTOMY    . THYROIDECTOMY  01/22/2017  . THYROIDECTOMY Left 01/22/2017   Procedure: THYROIDECTOMY;  Surgeon: Melida Quitter, MD;  Location: [place];  Service: ENT;  Laterality: Left;  . THYROIDECTOMY Right 03/26/2017   Procedure: COMPLETION OF THYROIDECTOMY;  Surgeon: Melida Quitter, MD;  Location: [place];  Service: ENT;  Laterality: Right;  . UMBILICAL HERNIA REPAIR    . WISDOM TOOTH EXTRACTION      Family History  Problem Relation Age of Onset  . Stroke Mother   . Pneumonia Father        died of Pneumonia x3  . Kidney disease Maternal Grandmother   . Hypertension Maternal Grandmother   . Healthy Maternal Grandfather   . Healthy Paternal Grandmother   . Healthy Paternal Grandfather   . CAD Neg Hx   . Colon cancer Neg Hx   . Esophageal cancer Neg Hx   . Pancreatic cancer Neg Hx   . Prostate cancer Neg Hx   . Stomach cancer Neg Hx   . Rectal cancer Neg Hx     Social History   Tobacco Use  . Smoking status: Current Every Day Smoker    Packs/day: 0.50    Years: 40.00    Pack years: 20.00    Types: Cigarettes  . Smokeless tobacco: Never  Used  Substance Use Topics  . Alcohol use: Yes    Alcohol/week: 0.0 oz    Comment: 01/22/2017 "might have 1 beer/month; if that"    Subjective:  Patient complaining of 2-3 day history of abdominal pain; does feel that food can make things worse; no fever; no changes in bowel movements; on Prilosec for GERD- in general "feels well controlled;" Feels that drinking water or any liquids make things worse; up to date on colonoscopy- due again in 2022; s/p cholecystectomy;  Saw  nephrologist on Friday and had labs drawn there- "the labs were fine." No vomiting; no coffee grounds emesis; denies chest pain or difficulty breathing; does occasionally feel short of breath but notes this "is not unusual" due to my heart issues;  Objective:  Vitals:   04/19/17 1601  BP: 138/70  Pulse: 78  Temp: 98.5 F (36.9 C)  SpO2: 99%  Weight: (!) 308 lb (139.7 kg)    General: Well developed, well nourished, in no acute distress  Skin : Warm and dry.  Head: Normocephalic and atraumatic  Eyes: Sclera and conjunctiva clear; pupils round and reactive to light; extraocular movements intact  Ears: External normal; canals clear; tympanic membranes normal  Oropharynx: Pink, supple. No suspicious lesions  Neck: Supple without thyromegaly, adenopathy  Lungs: Respirations unlabored; clear to auscultation bilaterally without wheeze, rales, rhonchi  CVS exam: normal rate and regular rhythm.  Abdomen: Soft; nontender; nondistended; normoactive bowel sounds; no masses or hepatosplenomegaly  Neurologic: Alert and oriented; speech intact; face symmetrical; moves all extremities well; CNII-XII intact without focal deficit  Assessment:  1. Generalized abdominal pain     Plan:  ? Etiology; will hold labs today as he just had labs done with nephrology- would like to get copies of those labs if possible; d/c Omeprazole- change to Protonix 40 mg daily; update abdominal ultrasound; follow-up to be determined; strict ER precautions  discussed.   No Follow-up on file.  Orders Placed This Encounter  Procedures  . US Abdomen Complete    Standing Status:   Future    Standing Expiration Date:   06/18/2018    Order Specific Question:   Reason for Exam (SYMPTOM  OR DIAGNOSIS REQUIRED)    Answer:   abdominal pain    Order Specific Question:   Preferred imaging location?    Answer:   [id number]    Requested Prescriptions   Signed Prescriptions Disp Refills  . pantoprazole (PROTONIX) 40 MG tablet 30 tablet 3    Sig: Take 1 tablet (40 mg total) by mouth daily.

## 2017-05-06 ENCOUNTER — Other Ambulatory Visit: Payer: Self-pay

## 2017-05-06 MED ORDER — GABAPENTIN 100 MG PO CAPS: 100.0000 mg | ORAL_CAPSULE | Freq: Every day | ORAL | 0 refills | Status: DC

## 2017-05-07 ENCOUNTER — Other Ambulatory Visit: Payer: BLUE CROSS/BLUE SHIELD

## 2017-05-14 ENCOUNTER — Telehealth: Payer: Self-pay | Admitting: Nurse Practitioner

## 2017-05-14 ENCOUNTER — Other Ambulatory Visit (INDEPENDENT_AMBULATORY_CARE_PROVIDER_SITE_OTHER): Payer: BLUE CROSS/BLUE SHIELD

## 2017-05-14 ENCOUNTER — Encounter: Payer: Self-pay | Admitting: Nurse Practitioner

## 2017-05-14 ENCOUNTER — Ambulatory Visit (INDEPENDENT_AMBULATORY_CARE_PROVIDER_SITE_OTHER): Payer: BLUE CROSS/BLUE SHIELD | Admitting: Nurse Practitioner

## 2017-05-14 VITALS — BP 120/62 | HR 71 | Temp 97.9°F | Resp 16 | Ht 74.0 in | Wt 304.4 lb

## 2017-05-14 DIAGNOSIS — M1A0690 Idiopathic chronic gout, unspecified knee, without tophus (tophi): Secondary | ICD-10-CM

## 2017-05-14 DIAGNOSIS — N184 Chronic kidney disease, stage 4 (severe): Secondary | ICD-10-CM | POA: Diagnosis not present

## 2017-05-14 DIAGNOSIS — K219 Gastro-esophageal reflux disease without esophagitis: Secondary | ICD-10-CM | POA: Diagnosis not present

## 2017-05-14 DIAGNOSIS — I10 Essential (primary) hypertension: Secondary | ICD-10-CM

## 2017-05-14 DIAGNOSIS — I429 Cardiomyopathy, unspecified: Secondary | ICD-10-CM | POA: Diagnosis not present

## 2017-05-14 DIAGNOSIS — R252 Cramp and spasm: Secondary | ICD-10-CM | POA: Diagnosis not present

## 2017-05-14 LAB — BASIC METABOLIC PANEL
BUN: 61 mg/dL — ABNORMAL HIGH (ref 6–23)
CO2: 25 mEq/L (ref 19–32)
Calcium: 9.9 mg/dL (ref 8.4–10.5)
Chloride: 105 mEq/L (ref 96–112)
Creatinine, Ser: 5.18 mg/dL (ref 0.40–1.50)
GFR: 14.88 mL/min — CL (ref >60.00)
Glucose, Bld: 91 mg/dL (ref 70–99)
Potassium: 4.4 mEq/L (ref 3.5–5.1)
Sodium: 140 mEq/L (ref 135–145)

## 2017-05-14 LAB — MAGNESIUM: Magnesium: 1.6 mg/dL (ref 1.5–2.5)

## 2017-05-14 LAB — URIC ACID: Uric Acid, Serum: 4.8 mg/dL (ref 4.0–7.8)

## 2017-05-14 LAB — CALCIUM: Calcium: 9.9 mg/dL (ref 8.4–10.5)

## 2017-05-14 NOTE — Patient Instructions (Addendum)
Please head downstairs for lab work.  Please return in about 3 months for an annual physical.  It was nice to meet you. Thanks for letting me take care of you today :)   Muscle Cramps and Spasms Muscle cramps and spasms are when muscles tighten by themselves. They usually get better within minutes. Muscle cramps are painful. They are usually stronger and last longer than muscle spasms. Muscle spasms may or may not be painful. They can last a few seconds or much longer. Follow these instructions at home:  Drink enough fluid to keep your pee (urine) clear or pale yellow.  Massage, stretch, and relax the muscle.  If directed, apply heat to tight or tense muscles as often as told by your doctor. Use the heat source that your doctor recommends. ? Place a towel between your skin and the heat source. ? Leave the heat on for 20-30 minutes. ? Take off the heat if your skin turns bright red. This is especially important if you are unable to feel pain, heat, or cold. You may have a greater risk of getting burned.  If directed, put ice on the affected area. This may help if you are sore or have pain after a cramp or spasm. ? Put ice in a plastic bag. ? Place a towel between your skin and the bag. ? Leave the ice on for 20 minutes, 2-3 times a day.  Take over-the-counter and prescription medicines only as told by your doctor.  Pay attention to any changes in your symptoms. Contact a doctor if:  Your cramps or spasms get worse or happen more often.  Your cramps or spasms do not get better with time. This information is not intended to replace advice given to you by your health care provider. Make sure you discuss any questions you have with your health care provider. Document Released: 02/20/2008 Document Revised: 04/10/2015 Document Reviewed: 12/11/2014 Elsevier Interactive Patient Education  2018 [employer].

## 2017-05-14 NOTE — Progress Notes (Signed)
Name: Craig Flynn   MRN: [id number]    DOB: [birth year]   Date:05/14/2017       Progress Note  Subjective  Chief Complaint  Chief Complaint  Patient presents with  . Establish Care    hand and feet cramps that has been goign on for a while    HPI Craig Flynn is establishing care with me today. He has a history of gout, GERD, cardiomyopathy, CKD stage 5, long-standing hypertension. He is followed by Dr Lorrene Reid, nephrology for his CKD with hemodialysis fistula in place but has not started dialysis yet. He is followed by Dr Percival Spanish, cardiology for his cardiomyopathy, hypertension. He also recently underwent thyroidectomy on 24/5 for follicular carcinoma of thyroid and continues follow up with Dr Redmond Baseman, otolaryngology, for post op care. He is currently awaiting referral to endocrinology for continued management of thyroid. He Is requesting evaluation of an acute complaint of cramps today.  Gout- maintained on allopurinol. Reports taking the medication daily without any adverse effects. Denies recent gout flare.  GERD-maintained on protonix. Reports taking protonix daily without adverse effects. Denies recent heartburn, acid regurgitation, but has felt this in the past when he was not taking his protonix.  Cramping- He reports chronic cramping in his hands and feet. He C/o recent increase in cramps today He has woken in the morning with "toes curled up from the cramps."  Patient Active Problem List   Diagnosis Date Noted  . 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 Thyroid nodule 01/22/2017  . Esophageal spasm 01/09/2016  . Lightheaded 01/09/2016  . Other headache syndrome 01/09/2016  . Ptosis 01/09/2016  . Dizziness 08/27/2015  . Fullness of abdomen 08/27/2015  . Chronic systolic heart failure ([place]) 08/01/2015  . GERD (gastroesophageal reflux disease) 07/29/2015  . Pain in the chest   . CKD (chronic kidney disease)   . Osteoarthritis 05/03/2015  . Hyperlipidemia 02/26/2015   . Diastolic dysfunction 80/99/8338  . Tobacco abuse 02/26/2015  . Atypical chest pain 02/25/2015  . Neck muscle spasm 01/15/2015  . OSA (obstructive sleep apnea) 12/27/2014  . Snoring 11/12/2014  . Routine general medical examination at a health care facility 11/12/2014  . Anemia in chronic kidney disease 11/12/2014  . Left knee pain 09/22/2014  . Chest pain 09/21/2014  . AKI (acute kidney injury) ([place]) 09/21/2014  . Systolic CHF ([place]) 25/07/3974  . Paresthesia of left leg 09/21/2014  . Acute systolic CHF (congestive heart failure) ([place]) 11/17/2013  . CKD (chronic kidney disease) stage 4, GFR 15-29 ml/min (HCC) 11/17/2013  . Morbid obesity ([place]) 11/17/2013  . Diabetes mellitus ([place]) 12/12/2011  . Acute on chronic renal failure ([place]) 12/11/2011  . Hypokalemia 12/11/2011  . HTN (hypertension) 12/11/2011  . Dyslipidemia 12/31/2006  . Gout 12/31/2006  . Essential hypertension 12/31/2006  . 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 BACKACHE NOS 12/29/2006    Past Surgical History:  Procedure Laterality Date  . AV FISTULA PLACEMENT Left 03/25/2016   Procedure: Creation of Left Arm ARTERIOVENOUS (AV) FISTULA;  Surgeon: Elam Dutch, MD;  Location: [facility] OR;  Service: Vascular;  Laterality: Left;  . COLONOSCOPY W/ BIOPSIES AND POLYPECTOMY     "no problem"  . LAPAROSCOPIC CHOLECYSTECTOMY    . THYROIDECTOMY  01/22/2017  . THYROIDECTOMY Left 01/22/2017   Procedure: THYROIDECTOMY;  Surgeon: Melida Quitter, MD;  Location: [facility];  Service: ENT;  Laterality: Left;  . THYROIDECTOMY Right 03/26/2017   Procedure: COMPLETION OF THYROIDECTOMY;  Surgeon: Melida Quitter, MD;  Location:  [facility] OR;  Service: ENT;  Laterality: Right;  . UMBILICAL HERNIA REPAIR    . WISDOM TOOTH EXTRACTION      Family History  Problem Relation Age of Onset  . Stroke Mother   . Pneumonia Father        died of Pneumonia x3  . Kidney disease Maternal Grandmother   . Hypertension Maternal Grandmother   .  Healthy Maternal Grandfather   . Healthy Paternal Grandmother   . Healthy Paternal Grandfather   . CAD Neg Hx   . Colon cancer Neg Hx   . Esophageal cancer Neg Hx   . Pancreatic cancer Neg Hx   . Prostate cancer Neg Hx   . Stomach cancer Neg Hx   . Rectal cancer Neg Hx     Social History   Socioeconomic History  . Marital status: Divorced    Spouse name: Not on file  . Number of children: 2  . Years of education: 45  . Highest education level: Not on file  Social Needs  . Financial resource strain: Not on file  . Food insecurity - worry: Not on file  . Food insecurity - inability: Not on file  . Transportation needs - medical: Not on file  . Transportation needs - non-medical: Not on file  Occupational History  . Occupation: Disability    Comment: Knees   Tobacco Use  . Smoking status: Current Every Day Smoker    Packs/day: 0.50    Years: 40.00    Pack years: 20.00    Types: Cigarettes  . Smokeless tobacco: Never Used  Substance and Sexual Activity  . Alcohol use: Yes    Alcohol/week: 0.0 oz    Comment: 01/22/2017 "might have 1 beer/month; if that"  . Drug use: No  . Sexual activity: Yes  Other Topics Concern  . Not on file  Social History Narrative   Fun: Golf, basketball    Denies religious beliefs effecting health care.      Current Outpatient Medications:  .  acetaminophen (TYLENOL) 500 MG tablet, Take 1,000 mg by mouth every 6 (six) hours as needed for mild pain or headache. , Disp: , Rfl:  .  allopurinol (ZYLOPRIM) 300 MG tablet, Take 1 tablet by mouth daily, Disp: 30 tablet, Rfl: 1 .  aspirin EC 81 MG tablet, Take 81 mg by mouth daily., Disp: , Rfl:  .  calcitRIOL (ROCALTROL) 0.25 MCG capsule, Take 0.25 mcg by mouth every Monday, Wednesday, and Friday., Disp: , Rfl:  .  carvedilol (COREG) 25 MG tablet, take 1 tablet by mouth twice a day (Patient taking differently: take 25 mg by mouth twice a day), Disp: 60 tablet, Rfl: 0 .  dicyclomine (BENTYL) 10 MG  capsule, Take 10 mg by mouth 4 (four) times daily -  before meals and at bedtime. , Disp: , Rfl: 0 .  fenofibrate 54 MG tablet, take 1 tablet by mouth once daily (Patient taking differently: take 54 mg by mouth once daily), Disp: 30 tablet, Rfl: 6 .  furosemide (LASIX) 80 MG tablet, Take 1 tablet (80 mg total) by mouth 2 (two) times daily. (Patient taking differently: Take 160 mg by mouth 2 (two) times daily. ), Disp: 60 tablet, Rfl: 1 .  gabapentin (NEURONTIN) 100 MG capsule, Take 1 capsule (100 mg total) by mouth at bedtime., Disp: 30 capsule, Rfl: 0 .  HYDROcodone-acetaminophen (NORCO/VICODIN) 5-325 MG tablet, Take 1-2 tablets by mouth every 6 (six) hours as needed for moderate pain., Disp: 20  tablet, Rfl: 0 .  iron polysaccharides (NIFEREX) 150 MG capsule, Take 150 mg by mouth 2 (two) times daily., Disp: , Rfl:  .  isosorbide mononitrate (IMDUR) 30 MG 24 hr tablet, Take 2 tablets (60 mg total) by mouth daily. Must keep scheduled appt for future reills, Disp: 60 tablet, Rfl: 1 .  levothyroxine (SYNTHROID) 200 MCG tablet, Take 1 tablet (200 mcg total) by mouth daily., Disp: 30 tablet, Rfl: 11 .  nitroGLYCERIN (NITROSTAT) 0.4 MG SL tablet, Place 0.4 mg under the tongue every 5 (five) minutes as needed for chest pain., Disp: , Rfl:  .  pantoprazole (PROTONIX) 40 MG tablet, Take 1 tablet (40 mg total) by mouth daily., Disp: 30 tablet, Rfl: 3 .  potassium chloride SA (K-DUR,KLOR-CON) 20 MEQ tablet, Take 20 mEq by mouth 2 (two) times daily., Disp: , Rfl:   No Known Allergies  Review of Systems  Constitutional: Negative for chills and fever.  HENT: Negative for hearing loss and sore throat.   Eyes: Negative for blurred vision and double vision.  Respiratory: Positive for shortness of breath. Negative for cough.   Cardiovascular: Negative for chest pain and palpitations.  Gastrointestinal: Negative for constipation, diarrhea and heartburn.  Genitourinary: Negative for dysuria and hematuria.   Musculoskeletal: Positive for joint pain. Negative for falls.  Skin: Negative for rash.  Neurological: Positive for headaches. Negative for speech change.  Endo/Heme/Allergies: Does not bruise/bleed easily.  Psychiatric/Behavioral: Negative for depression. The patient is not nervous/anxious.    He says his shortness of breath and headaches are chronic with no new, acute changes today  Objective  Vitals:   05/14/17 0811  BP: 120/62  Pulse: 71  Resp: 16  Temp: 97.9 F (36.6 C)  TempSrc: Oral  SpO2: 98%  Weight: (!) 304 lb 6.4 oz (138.1 kg)  Height: 6\' 2"  (1.88 m)   Body mass index is 39.08 kg/m.  Physical Exam Vital signs reviewed. Constitutional: Patient appears well-developed and well-nourished. No distress.  HENT: Head: Normocephalic and atraumatic. Nose: Nose normal. Mouth/Throat: Oropharynx is clear and moist.  Eyes: Conjunctivae and EOM are normal. Pupils are equal, round, and reactive to light. No scleral icterus.  Neck: Normal range of motion. Neck supple.  No thyromegaly present.  Cardiovascular: Normal rate, regular rhythm and normal heart sounds.  No murmur heard. Distal pulses intact. Pulmonary/Chest: Effort normal and breath sounds normal. No respiratory distress. Abdominal: Soft. Bowel sounds are normal, no distension.  Musculoskeletal: Normal range of motion, no joint effusions. No gross deformities Neurological: He is alert and oriented to person, place, and time. No cranial nerve deficit. Coordination, balance, strength, speech and gait are normal.  Skin: Skin is warm and dry. No rash noted. No erythema.  Psychiatric: Patient has a normal mood and affect. behavior is normal. Judgment and thought content normal.  PHQ2/9: Depression screen PHQ 2/9 05/14/2017  Decreased Interest 0  Down, Depressed, Hopeless 0  PHQ - 2 Score 0   Fall Risk: Fall Risk  05/14/2017  Falls in the past year? No    Assessment & Plan RTC for CPE in about 3 months  Cramps,  extremity ?could be thryoid related Will check labs today. Return precautions and home care discussed-See AVS for education provided to patient - Basic metabolic panel; Future - Calcium; Future - Magnesium; Future  Cardiomyopathy, unspecified type [facility]) - Ambulatory referral to Cardiology

## 2017-05-14 NOTE — Telephone Encounter (Signed)
CRITICAL VALUE STICKER  CRITICAL VALUE:Creatinine 5.18, GFR 14.88  RECEIVER (on-site recipient of call):Julie  DATE & TIME NOTIFIED: 2/22 at 11:06  MESSENGER (representative from lab):Saa  MD NOTIFIED: Gayla Medicus  TIME OF NOTIFICATION:11"06  RESPONSE: Pending

## 2017-05-16 ENCOUNTER — Encounter: Payer: Self-pay | Admitting: Nurse Practitioner

## 2017-05-16 NOTE — Assessment & Plan Note (Signed)
Per epic chart review cardiology office requested patient to follow up in 6 months, no appointment has been scheduled. He did send a refill request for imdur to me, which was automatically filled We discussed following back up with cardiology for routine care - Ambulatory referral to Cardiology

## 2017-05-16 NOTE — Assessment & Plan Note (Signed)
Continue following with nephrology Dr Lorrene Reid

## 2017-05-16 NOTE — Telephone Encounter (Signed)
Noted  

## 2017-05-16 NOTE — Assessment & Plan Note (Signed)
Continue allopurinol -Uric acid- lab added after visit today

## 2017-05-16 NOTE — Assessment & Plan Note (Signed)
Stable, continue protonix

## 2017-05-19 NOTE — Progress Notes (Signed)
Cardiology Office Note   Date:  05/20/2017   ID:  Craig Flynn, DOB [birth year], MRN [id number]  PCP:  Lance Sell, NP  Cardiologist:   Minus Breeding, MD   Chief Complaint  Patient presents with  . Chest Pain     History of Present Illness: Craig Flynn is a 57 y.o. male who presents for evaluation of cardiomyopathy and hypertension. The patient has long-standing hypertension. He was seen a couple of years ago with mildly reduced ejection fraction of about 45%. In July 2016 his EF was said to be about 55%.  Last year he was in the hospital with chest pain.  He had a a perfusion defect that was fixed in the inferior wall with an EF of 47%.   There was no active ischemia.  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  He did well with this.  The patient denies any new symptoms such as chest discomfort, neck or arm discomfort. There has been no new shortness of breath, PND or orthopnea. There have been no reported palpitations, presyncope or syncope.  He still has some of the mild burning under both breasts that is stable and sporadic and was unchanged since the perfusion study.    He does some walking with his wife and does not bring this on.   Past Medical History:  Diagnosis Date  . Anemia 06/2015  . Arthritis    knee  . Cancer [facility])    thyroid cancer  . Cardiomyopathy ([place])    a. 10/2013: EF reduced to 35-40% b. 09/2014: EF improved to 55-60%, Grade 1 DD noted.  . Chest pain 06/2015  . CHF (congestive heart failure) ([place])   . CKD (chronic kidney disease), stage IV [facility])    sees  Dr Lorrene Reid  . Dyspnea    with exerion   . GERD (gastroesophageal reflux disease)   . Gout   . Hypertension   . Obesity   . Sleep apnea    not wearing c-pap now, needs new slep study per pt. (01/22/2017)  . Tuberculosis 1981   while the pt was in the service.  . Tubular adenoma of colon 10/2015  . Wears glasses     Past Surgical History:  Procedure  Laterality Date  . AV FISTULA PLACEMENT Left 03/25/2016   Procedure: Creation of Left Arm ARTERIOVENOUS (AV) FISTULA;  Surgeon: Elam Dutch, MD;  Location: [facility] OR;  Service: Vascular;  Laterality: Left;  . COLONOSCOPY W/ BIOPSIES AND POLYPECTOMY     "no problem"  . LAPAROSCOPIC CHOLECYSTECTOMY    . THYROIDECTOMY  01/22/2017  . THYROIDECTOMY Left 01/22/2017   Procedure: THYROIDECTOMY;  Surgeon: Melida Quitter, MD;  Location: [place];  Service: ENT;  Laterality: Left;  . THYROIDECTOMY Right 03/26/2017   Procedure: COMPLETION OF THYROIDECTOMY;  Surgeon: Melida Quitter, MD;  Location: [place];  Service: ENT;  Laterality: Right;  . UMBILICAL HERNIA REPAIR    . WISDOM TOOTH EXTRACTION       Current Outpatient Medications  Medication Sig Dispense Refill  . acetaminophen (TYLENOL) 500 MG tablet Take 1,000 mg by mouth every 6 (six) hours as needed for mild pain or headache.     . allopurinol (ZYLOPRIM) 300 MG tablet Take 1 tablet by mouth daily 30 tablet 1  . aspirin EC 81 MG tablet Take 81 mg by mouth daily.    . calcitRIOL (ROCALTROL) 0.25 MCG capsule Take 0.25 mcg by mouth every Monday,  Wednesday, and Friday.    . carvedilol (COREG) 25 MG tablet take 1 tablet by mouth twice a day (Patient taking differently: take 25 mg by mouth twice a day) 60 tablet 0  . dicyclomine (BENTYL) 10 MG capsule Take 10 mg by mouth 4 (four) times daily -  before meals and at bedtime.   0  . fenofibrate 54 MG tablet take 1 tablet by mouth once daily (Patient taking differently: take 54 mg by mouth once daily) 30 tablet 6  . furosemide (LASIX) 80 MG tablet Take 1 tablet (80 mg total) by mouth 2 (two) times daily. (Patient taking differently: Take 160 mg by mouth 2 (two) times daily. ) 60 tablet 1  . gabapentin (NEURONTIN) 100 MG capsule Take 1 capsule (100 mg total) by mouth at bedtime. 30 capsule 0  . HYDROcodone-acetaminophen (NORCO/VICODIN) 5-325 MG tablet Take 1-2 tablets by mouth every 6 (six) hours as needed for moderate  pain. 20 tablet 0  . iron polysaccharides (NIFEREX) 150 MG capsule Take 150 mg by mouth 2 (two) times daily.    . isosorbide mononitrate (IMDUR) 30 MG 24 hr tablet Take 2 tablets (60 mg total) by mouth daily. Must keep scheduled appt for future reills (Patient taking differently: Take 60 mg by mouth daily. This medication should be provided by cardiology) 60 tablet 1  . levothyroxine (SYNTHROID) 200 MCG tablet Take 1 tablet (200 mcg total) by mouth daily. 30 tablet 11  . nitroGLYCERIN (NITROSTAT) 0.4 MG SL tablet Place 0.4 mg under the tongue every 5 (five) minutes as needed for chest pain.    . pantoprazole (PROTONIX) 40 MG tablet Take 1 tablet (40 mg total) by mouth daily. 30 tablet 3  . potassium chloride SA (K-DUR,KLOR-CON) 20 MEQ tablet Take 20 mEq by mouth 2 (two) times daily.     No current facility-administered medications for this visit.     Allergies:   Patient has no known allergies.   ROS:  Please see the history of present illness.   Otherwise, review of systems are positive for knee pain and leg swelling and hand cramping.    All other systems are reviewed and negative.    PHYSICAL EXAM: VS:  BP (!) 100/50   Pulse 83   Ht 6\' 2"  (1.88 m)   Wt (!) 301 lb (136.5 kg)   BMI 38.65 kg/m  , BMI Body mass index is 38.65 kg/m.  GENERAL:  Well appearing NECK:  No jugular venous distention, waveform within normal limits, carotid upstroke brisk and symmetric, no bruits, no thyromegaly LUNGS:  Clear to auscultation bilaterally CHEST:  Unremarkable HEART:  PMI not displaced or sustained,S1 and S2 within normal limits, no S3, no S4, no clicks, no rubs, no murmurs ABD:  Flat, positive bowel sounds normal in frequency in pitch, no bruits, no rebound, no guarding, no midline pulsatile mass, no hepatomegaly, no splenomegaly EXT:  2 plus pulses throughout, right greater than left leg edema, no cyanosis no clubbing  EKG:  EKG is not   ordered today.    Recent Labs: 03/26/2017: Hemoglobin  11.4; Platelets 188 05/14/2017: BUN 61; Creatinine, Ser 5.18; Magnesium 1.6; Potassium 4.4; Sodium 140    Lipid Panel    Component Value Date/Time   CHOL 167 07/19/2015 0243   TRIG 215 (H) 07/19/2015 0243   TRIG 98 03/03/2006 0910   HDL 19 (L) 07/19/2015 0243   CHOLHDL 8.8 07/19/2015 0243   VLDL 43 (H) 07/19/2015 0243   LDLCALC 105 (H) 07/19/2015  0243      Wt Readings from Last 3 Encounters:  05/20/17 (!) 301 lb (136.5 kg)  05/14/17 (!) 304 lb 6.4 oz (138.1 kg)  04/19/17 (!) 308 lb (139.7 kg)      Other studies Reviewed: Additional studies/ records that were reviewed today include: None Review of the above records demonstrates:     ASSESSMENT AND PLAN:   CARDIOMYOPATHY:  He has no new symptoms.  No further imaging is indicated.   HTN:  The blood pressure is at target.  No change in therapy.   OBESITY:   He gained a little weight and we talked again about weight loss. go up.  CKD:  He remains off of dialysis.   his is followed by Dr. Lorrene Reid.  CHEST PAIN:   This is stable and atypical and mild.  No further testing.   Current medicines are reviewed at length with the patient today.  The patient does not have concerns regarding medicines.  The following changes have been made:  None  Labs/ tests ordered today include:  None  No orders of the defined types were placed in this encounter.    Disposition:     Follow up in one year.     Signed, Minus Breeding, MD  05/20/2017 8:39 AM    [facility]

## 2017-05-20 ENCOUNTER — Ambulatory Visit (INDEPENDENT_AMBULATORY_CARE_PROVIDER_SITE_OTHER): Payer: BLUE CROSS/BLUE SHIELD | Admitting: Cardiology

## 2017-05-20 ENCOUNTER — Encounter: Payer: Self-pay | Admitting: Cardiology

## 2017-05-20 VITALS — BP 100/50 | HR 83 | Ht 74.0 in | Wt 301.0 lb

## 2017-05-20 DIAGNOSIS — R0789 Other chest pain: Secondary | ICD-10-CM

## 2017-05-20 DIAGNOSIS — I5022 Chronic systolic (congestive) heart failure: Secondary | ICD-10-CM

## 2017-05-20 NOTE — Patient Instructions (Signed)
Medication Instructions:  Continue current medications  If you need a refill on your cardiac medications before your next appointment, please call your pharmacy.  Labwork: None Ordered   Testing/Procedures: None Ordered  Follow-Up: Your physician wants you to follow-up in: 1 Year. You should receive a reminder letter in the mail two months in advance. If you do not receive a letter, please call our office [phone].    Thank you for choosing [facility]!!      

## 2017-05-24 ENCOUNTER — Other Ambulatory Visit: Payer: Self-pay

## 2017-05-25 ENCOUNTER — Telehealth: Payer: Self-pay | Admitting: Nurse Practitioner

## 2017-05-25 NOTE — Telephone Encounter (Signed)
Copied from [place] [phone]. Topic: Quick Communication - Rx Refill/Question >> May 25, 2017  9:45 AM Antonieta Iba C wrote: Medication:  isosorbide mononitrate (IMDUR) 30 MG 24 hr tablet -- pt says that his pharmacy has faxed over request several of time and has given him an emergency supply to hold him until Rx received. Pt says that he is out and need his heart medication.    Has the patient contacted their pharmacy? Yes    (Agent: If no, request that the patient contact the pharmacy for the refill.)   Preferred Pharmacy (with phone number or street name): [facility] [phone] - [place], [address]   Agent: Please be advised that RX refills may take up to 3 business days. We ask that you follow-up with your pharmacy.

## 2017-05-26 ENCOUNTER — Other Ambulatory Visit: Payer: Self-pay

## 2017-05-26 MED ORDER — ISOSORBIDE MONONITRATE ER 30 MG PO TB24: 60.0000 mg | ORAL_TABLET | Freq: Every day | ORAL | 1 refills | Status: DC

## 2017-05-26 MED ORDER — ALLOPURINOL 300 MG PO TABS: ORAL_TABLET | ORAL | 1 refills | Status: DC

## 2017-06-09 DIAGNOSIS — C73 Malignant neoplasm of thyroid gland: Secondary | ICD-10-CM | POA: Diagnosis not present

## 2017-06-09 DIAGNOSIS — E890 Postprocedural hypothyroidism: Secondary | ICD-10-CM | POA: Diagnosis not present

## 2017-06-10 ENCOUNTER — Other Ambulatory Visit (HOSPITAL_COMMUNITY): Payer: Self-pay | Admitting: Endocrinology

## 2017-06-10 DIAGNOSIS — C73 Malignant neoplasm of thyroid gland: Secondary | ICD-10-CM

## 2017-06-11 ENCOUNTER — Other Ambulatory Visit: Payer: Self-pay

## 2017-06-11 ENCOUNTER — Emergency Department (HOSPITAL_COMMUNITY)
Admission: EM | Admit: 2017-06-11 | Discharge: 2017-06-12 | Disposition: A | Discharge status: Home / Self Care | Payer: BLUE CROSS/BLUE SHIELD | Attending: Emergency Medicine | Admitting: Emergency Medicine

## 2017-06-11 ENCOUNTER — Emergency Department (HOSPITAL_COMMUNITY): Payer: BLUE CROSS/BLUE SHIELD

## 2017-06-11 DIAGNOSIS — Z79899 Other long term (current) drug therapy: Secondary | ICD-10-CM | POA: Diagnosis not present

## 2017-06-11 DIAGNOSIS — F17210 Nicotine dependence, cigarettes, uncomplicated: Secondary | ICD-10-CM | POA: Insufficient documentation

## 2017-06-11 DIAGNOSIS — I5021 Acute systolic (congestive) heart failure: Secondary | ICD-10-CM | POA: Insufficient documentation

## 2017-06-11 DIAGNOSIS — R079 Chest pain, unspecified: Secondary | ICD-10-CM | POA: Diagnosis not present

## 2017-06-11 DIAGNOSIS — E119 Type 2 diabetes mellitus without complications: Secondary | ICD-10-CM | POA: Insufficient documentation

## 2017-06-11 DIAGNOSIS — Z7982 Long term (current) use of aspirin: Secondary | ICD-10-CM | POA: Insufficient documentation

## 2017-06-11 DIAGNOSIS — N184 Chronic kidney disease, stage 4 (severe): Secondary | ICD-10-CM | POA: Insufficient documentation

## 2017-06-11 DIAGNOSIS — I130 Hypertensive heart and chronic kidney disease with heart failure and stage 1 through stage 4 chronic kidney disease, or unspecified chronic kidney disease: Secondary | ICD-10-CM | POA: Diagnosis not present

## 2017-06-11 LAB — BASIC METABOLIC PANEL
Anion gap: 11 (ref 5–15)
BUN: 54 mg/dL — ABNORMAL HIGH (ref 6–20)
CO2: 23 mmol/L (ref 22–32)
Calcium: 9.5 mg/dL (ref 8.9–10.3)
Chloride: 101 mmol/L (ref 101–111)
Creatinine, Ser: 4.73 mg/dL — ABNORMAL HIGH (ref 0.61–1.24)
GFR calc Af Amer: 15 mL/min — ABNORMAL LOW (ref >60)
GFR calc non Af Amer: 13 mL/min — ABNORMAL LOW (ref >60)
Glucose, Bld: 91 mg/dL (ref 65–99)
Potassium: 4 mmol/L (ref 3.5–5.1)
Sodium: 135 mmol/L (ref 135–145)

## 2017-06-11 LAB — CBC
HCT: 37.6 % — ABNORMAL LOW (ref 39.0–52.0)
Hemoglobin: 11.9 g/dL — ABNORMAL LOW (ref 13.0–17.0)
MCH: 27.1 pg (ref 26.0–34.0)
MCHC: 31.6 g/dL (ref 30.0–36.0)
MCV: 85.6 fL (ref 78.0–100.0)
Platelets: 222 10*3/uL (ref 150–400)
RBC: 4.39 MIL/uL (ref 4.22–5.81)
RDW: 14.2 % (ref 11.5–15.5)
WBC: 6.2 10*3/uL (ref 4.0–10.5)

## 2017-06-11 LAB — I-STAT TROPONIN, ED
Troponin i, poc: 0.01 ng/mL (ref 0.00–0.08)
Troponin i, poc: 0.02 ng/mL (ref 0.00–0.08)

## 2017-06-11 NOTE — Discharge Instructions (Addendum)
All of your tests are within normal limits. You can be discharged home and should make a recheck appointment with either your cardiologist or your primary care provider for recheck. If symptoms worsen or you have new symptoms of concern, please return to the emergency department for further evaluation.

## 2017-06-11 NOTE — ED Triage Notes (Signed)
Pt complains of chest pain beginning last night and worsening through today. Pt states he is always short of breath, but is a "little worse" right now

## 2017-06-11 NOTE — ED Provider Notes (Signed)
[place] EMERGENCY DEPARTMENT Provider Note   CSN: [id number] Arrival date & time: 06/11/17  1902     History   Chief Complaint Chief Complaint  Patient presents with  . Chest Pain  . Shortness of Breath    HPI Craig Flynn is a 57 y.o. male.  Patient with history of HTN, thyroid CA s/p thyroidectomy, CKD (pre-dialysis), CHF presents with chest pain. Symptoms started yesterday and are described as central chest pain that is sharp, associated with mild SOB over his chronic dyspnea, lightheadedness. No nausea or diaphoresis. Symptoms last 4-5 minutes and resolve. No modifying factors - occurs with exertion and at rest. No fever, cough, other URI symptoms. No LE swelling.   The history is provided by the patient. No language interpreter was used.  Chest Pain   Associated symptoms include shortness of breath. Pertinent negatives include no cough, no diaphoresis, no fever, no nausea, no vomiting and no weakness.  Shortness of Breath  Associated symptoms include chest pain. Pertinent negatives include no fever, no cough, no vomiting and no leg swelling.    Past Medical History:  Diagnosis Date  . Anemia 06/2015  . Arthritis    knee  . Cancer [facility])    thyroid cancer  . Cardiomyopathy ([place])    a. 10/2013: EF reduced to 35-40% b. 09/2014: EF improved to 55-60%, Grade 1 DD noted.  . Chest pain 06/2015  . CHF (congestive heart failure) ([place])   . CKD (chronic kidney disease), stage IV [facility])    sees  Dr Lorrene Reid  . Dyspnea    with exerion   . GERD (gastroesophageal reflux disease)   . Gout   . Hypertension   . Obesity   . Sleep apnea    not wearing c-pap now, needs new slep study per pt. (01/22/2017)  . Tuberculosis 1981   while the pt was in the service.  . Tubular adenoma of colon 10/2015  . Wears glasses     Patient Active Problem List   Diagnosis Date Noted  . Follicular thyroid cancer ([place]) 03/26/2017  . Thyroid nodule 01/22/2017  . Esophageal  spasm 01/09/2016  . Lightheaded 01/09/2016  . Other headache syndrome 01/09/2016  . Ptosis 01/09/2016  . Dizziness 08/27/2015  . Fullness of abdomen 08/27/2015  . Chronic systolic heart failure ([place]) 08/01/2015  . GERD (gastroesophageal reflux disease) 07/29/2015  . Pain in the chest   . Osteoarthritis 05/03/2015  . Hyperlipidemia 02/26/2015  . Diastolic dysfunction 62/22/9798  . Tobacco abuse 02/26/2015  . Atypical chest pain 02/25/2015  . Neck muscle spasm 01/15/2015  . OSA (obstructive sleep apnea) 12/27/2014  . Snoring 11/12/2014  . Routine general medical examination at a health care facility 11/12/2014  . Anemia in chronic kidney disease 11/12/2014  . Left knee pain 09/22/2014  . Chest pain 09/21/2014  . AKI (acute kidney injury) ([place]) 09/21/2014  . Systolic CHF ([place]) 92/01/9416  . Paresthesia of left leg 09/21/2014  . Acute systolic CHF (congestive heart failure) ([place]) 11/17/2013  . CKD (chronic kidney disease) stage 4, GFR 15-29 ml/min (H[facility]) 11/17/2013  . Morbid obesity ([place]) 11/17/2013  . Diabetes mellitus ([place]) 12/12/2011  . Acute on chronic renal failure ([place]) 12/11/2011  . Hypokalemia 12/11/2011  . HTN (hypertension) 12/11/2011  . Dyslipidemia 12/31/2006  . Gout 12/31/2006  . Essential hypertension 12/31/2006  . 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 BACKACHE NOS 12/29/2006    Past Surgical History:  Procedure Laterality Date  . AV  FISTULA PLACEMENT Left 03/25/2016   Procedure: Creation of Left Arm ARTERIOVENOUS (AV) FISTULA;  Surgeon: Elam Dutch, MD;  Location: [facility] OR;  Service: Vascular;  Laterality: Left;  . COLONOSCOPY W/ BIOPSIES AND POLYPECTOMY     "no problem"  . LAPAROSCOPIC CHOLECYSTECTOMY    . THYROIDECTOMY  01/22/2017  . THYROIDECTOMY Left 01/22/2017   Procedure: THYROIDECTOMY;  Surgeon: Melida Quitter, MD;  Location: [place];  Service: ENT;  Laterality: Left;  . THYROIDECTOMY Right 03/26/2017   Procedure: COMPLETION OF  THYROIDECTOMY;  Surgeon: Melida Quitter, MD;  Location: [place];  Service: ENT;  Laterality: Right;  . UMBILICAL HERNIA REPAIR    . WISDOM TOOTH EXTRACTION          Home Medications    Prior to Admission medications   Medication Sig Start Date End Date Taking? Authorizing Provider  acetaminophen (TYLENOL) 500 MG tablet Take 1,000 mg by mouth every 6 (six) hours as needed for mild pain or headache.    Yes [provider]  allopurinol (ZYLOPRIM) 300 MG tablet Take 1 tablet by mouth daily 05/26/17  Yes Lance Sell, NP  aspirin EC 81 MG tablet Take 81 mg by mouth daily.   Yes [provider]  calcitRIOL (ROCALTROL) 0.25 MCG capsule Take 0.25 mcg by mouth every Monday, Wednesday, and Friday.   Yes [provider]  carvedilol (COREG) 25 MG tablet take 1 tablet by mouth twice a day Patient taking differently: take 1 tablet (25 mg) by mouth twice a day 12/01/16  Yes Minus Breeding, MD  fenofibrate 54 MG tablet take 1 tablet by mouth once daily Patient taking differently: take 1 tablet (54 mg) by mouth once daily 03/09/17  Yes Minus Breeding, MD  furosemide (LASIX) 80 MG tablet Take 1 tablet (80 mg total) by mouth 2 (two) times daily. Patient taking differently: Take 160 mg by mouth 2 (two) times daily.  02/27/15  Yes Reyne Dumas, MD  gabapentin (NEURONTIN) 100 MG capsule Take 1 capsule (100 mg total) by mouth at bedtime. 05/06/17  Yes Lance Sell, NP  isosorbide mononitrate (IMDUR) 30 MG 24 hr tablet Take 2 tablets (60 mg total) by mouth daily. Must keep scheduled appt for future reills Patient taking differently: Take 30 mg by mouth 2 (two) times daily with a meal. Must keep scheduled appt for future reills 05/26/17  Yes Lance Sell, NP  levothyroxine (SYNTHROID) 200 MCG tablet Take 1 tablet (200 mcg total) by mouth daily. 03/26/17 03/26/18 Yes Melida Quitter, MD  nitroGLYCERIN (NITROSTAT) 0.4 MG SL tablet Place 0.4 mg under the tongue every 5 (five)  minutes as needed for chest pain.   Yes [provider]  pantoprazole (PROTONIX) 40 MG tablet Take 1 tablet (40 mg total) by mouth daily. 04/19/17  Yes Marrian Salvage, FNP  potassium chloride SA (K-DUR,KLOR-CON) 20 MEQ tablet Take 20 mEq by mouth 2 (two) times daily.   Yes [provider]  HYDROcodone-acetaminophen (NORCO/VICODIN) 5-325 MG tablet Take 1-2 tablets by mouth every 6 (six) hours as needed for moderate pain. Patient not taking: Reported on 06/11/2017 03/26/17 03/26/18  Melida Quitter, MD    Family History Family History  Problem Relation Age of Onset  . Stroke Mother   . Pneumonia Father        died of Pneumonia x3  . Kidney disease Maternal Grandmother   . Hypertension Maternal Grandmother   . Healthy Maternal Grandfather   . Healthy Paternal Grandmother   . Healthy Paternal  Grandfather   . CAD Neg Hx   . Colon cancer Neg Hx   . Esophageal cancer Neg Hx   . Pancreatic cancer Neg Hx   . Prostate cancer Neg Hx   . Stomach cancer Neg Hx   . Rectal cancer Neg Hx     Social History Social History   Tobacco Use  . Smoking status: Current Every Day Smoker    Packs/day: 0.50    Years: 40.00    Pack years: 20.00    Types: Cigarettes  . Smokeless tobacco: Never Used  Substance Use Topics  . Alcohol use: Yes    Alcohol/week: 0.0 oz    Comment: 01/22/2017 "might have 1 beer/month; if that"  . Drug use: No     Allergies   Patient has no known allergies.   Review of Systems Review of Systems  Constitutional: Negative for chills, diaphoresis and fever.  HENT: Negative.  Negative for congestion.   Respiratory: Positive for shortness of breath. Negative for cough.   Cardiovascular: Positive for chest pain. Negative for leg swelling.  Gastrointestinal: Negative.  Negative for nausea and vomiting.  Musculoskeletal: Negative.   Skin: Negative.   Neurological: Positive for light-headedness. Negative for syncope and weakness.     Physical  Exam Updated Vital Signs BP (!) 151/66   Pulse 73   Temp 97.9 F (36.6 C)   Resp 13   Ht 6' (1.829 m)   Wt (!) 137.9 kg (304 lb)   SpO2 99%   BMI 41.23 kg/m   Physical Exam  Constitutional: He is oriented to person, place, and time. He appears well-developed and well-nourished.  HENT:  Head: Normocephalic.  Neck: Normal range of motion. Neck supple.  Cardiovascular: Normal rate and regular rhythm.  Pulmonary/Chest: Effort normal and breath sounds normal. He has no decreased breath sounds. He has no wheezes. He has no rhonchi. He has no rales.  Abdominal: Soft. Bowel sounds are normal. There is no tenderness. There is no rebound and no guarding.  Musculoskeletal: Normal range of motion.  No pretibial edema. Right leg larger than left, "unchanged" per patient.  Neurological: He is alert and oriented to person, place, and time.  Skin: Skin is warm and dry. He is not diaphoretic.  Psychiatric: He has a normal mood and affect.     ED Treatments / Results  Labs (all labs ordered are listed, but only abnormal results are displayed) Labs Reviewed  BASIC METABOLIC PANEL - Abnormal; Notable for the following components:      Result Value   BUN 54 (*)    Creatinine, Ser 4.73 (*)    GFR calc non Af Amer 13 (*)    GFR calc Af Amer 15 (*)    All other components within normal limits  CBC - Abnormal; Notable for the following components:   Hemoglobin 11.9 (*)    HCT 37.6 (*)    All other components within normal limits  I-STAT TROPONIN, ED  I-STAT TROPONIN, ED   Results for orders placed or performed during the hospital encounter of 29/93/71  Basic metabolic panel  Result Value Ref Range   Sodium 135 135 - 145 mmol/L   Potassium 4.0 3.5 - 5.1 mmol/L   Chloride 101 101 - 111 mmol/L   CO2 23 22 - 32 mmol/L   Glucose, Bld 91 65 - 99 mg/dL   BUN 54 (H) 6 - 20 mg/dL   Creatinine, Ser 4.73 (H) 0.61 - 1.24 mg/dL   Calcium 9.5  8.9 - 10.3 mg/dL   GFR calc non Af Amer 13 (L) >60  mL/min   GFR calc Af Amer 15 (L) >60 mL/min   Anion gap 11 5 - 15  CBC  Result Value Ref Range   WBC 6.2 4.0 - 10.5 K/uL   RBC 4.39 4.22 - 5.81 MIL/uL   Hemoglobin 11.9 (L) 13.0 - 17.0 g/dL   HCT 37.6 (L) 39.0 - 52.0 %   MCV 85.6 78.0 - 100.0 fL   MCH 27.1 26.0 - 34.0 pg   MCHC 31.6 30.0 - 36.0 g/dL   RDW 14.2 11.5 - 15.5 %   Platelets 222 150 - 400 K/uL  I-stat troponin, ED  Result Value Ref Range   Troponin i, poc 0.01 0.00 - 0.08 ng/mL   Comment 3            EKG EKG Interpretation  Date/Time:  Friday June 11 2017 19:11:36 EDT Ventricular Rate:  77 PR Interval:  188 QRS Duration: 92 QT Interval:  390 QTC Calculation: 441 R Axis:   38 Text Interpretation:  Normal sinus rhythm Nonspecific ST abnormality Abnormal ECG No significant change since last tracing Confirmed by Wandra Arthurs [phone]) on 06/11/2017 10:29:31 PM   Radiology Dg Chest 2 View  Result Date: 06/11/2017 CLINICAL DATA:  Chest pain EXAM: CHEST - 2 VIEW COMPARISON:  01/20/2017 FINDINGS: The heart size and mediastinal contours are within normal limits. Both lungs are clear. The visualized skeletal structures are unremarkable. IMPRESSION: No active cardiopulmonary disease. Electronically Signed   By: Donavan Foil M.D.   On: 06/11/2017 20:11    Procedures Procedures (including critical care time)  Medications Ordered in ED Medications - No data to display   Initial Impression / Assessment and Plan / ED Course  I have reviewed the triage vital signs and the nursing notes.  Pertinent labs & imaging results that were available during my care of the patient were reviewed by me and considered in my medical decision making (see chart for details).     Patient presents with Chest discomfort since yesterday. Atypical for ACS in that it is sharp, non-exertional. Negative EKG, troponin. Cr improved since last check. CXR clear - does not appear to be acute CHF.  Delta trop obtained secondary to risk factors.  Patient continues to be pain free.   Delta troponin is negative. The patient has no further pain. VSS. He can be discharged home with outpatient follow up for recheck of symptoms. REturn precautions discussed.   Final Clinical Impressions(s) / ED Diagnoses   Final diagnoses:  None   1. Nonspecific chest pain  ED Discharge Orders    None       Charlann Lange, Hershal Coria 06/11/17 2320    Drenda Freeze, MD 06/15/17 1046

## 2017-06-12 NOTE — ED Notes (Signed)
Pt discharged from ED; instructions provided; Pt encouraged to return to ED if symptoms worsen and to f/u with PCP; Pt verbalized understanding of all instructions 

## 2017-06-14 ENCOUNTER — Other Ambulatory Visit: Payer: Self-pay

## 2017-06-14 MED ORDER — NITROGLYCERIN 0.4 MG SL SUBL: 0.4000 mg | SUBLINGUAL_TABLET | SUBLINGUAL | 3 refills | Status: DC | PRN

## 2017-06-21 ENCOUNTER — Ambulatory Visit (HOSPITAL_COMMUNITY)
Admission: RE | Admit: 2017-06-21 | Discharge: 2017-06-21 | Disposition: A | Discharge status: Home / Self Care | Payer: BLUE CROSS/BLUE SHIELD | Source: Ambulatory Visit | Attending: Endocrinology | Admitting: Endocrinology

## 2017-06-21 DIAGNOSIS — C73 Malignant neoplasm of thyroid gland: Secondary | ICD-10-CM

## 2017-06-21 MED ORDER — STERILE WATER FOR INJECTION IJ SOLN
INTRAMUSCULAR | Status: AC
  Filled 2017-06-21: qty 10

## 2017-06-21 MED ORDER — THYROTROPIN ALFA 1.1 MG IM SOLR
0.9000 mg | INTRAMUSCULAR | Status: AC
  Administered 2017-06-21: 0.9 mg via INTRAMUSCULAR

## 2017-06-22 ENCOUNTER — Encounter (HOSPITAL_COMMUNITY)
Admission: RE | Admit: 2017-06-22 | Discharge: 2017-06-22 | Disposition: A | Discharge status: Home / Self Care | Payer: BLUE CROSS/BLUE SHIELD | Source: Ambulatory Visit | Attending: Endocrinology | Admitting: Endocrinology

## 2017-06-22 ENCOUNTER — Encounter (HOSPITAL_COMMUNITY): Payer: Self-pay

## 2017-06-22 DIAGNOSIS — C73 Malignant neoplasm of thyroid gland: Secondary | ICD-10-CM | POA: Diagnosis not present

## 2017-06-22 MED ORDER — TECHNETIUM TC 99M DIETHYLENETRIAME-PENTAACETIC ACID
30.0000 | Freq: Once | INTRAVENOUS | Status: AC | PRN
  Administered 2017-06-22: 30 via RESPIRATORY_TRACT

## 2017-06-22 MED ORDER — THYROTROPIN ALFA 1.1 MG IM SOLR
0.9000 mg | INTRAMUSCULAR | Status: AC
  Administered 2017-06-22: 0.9 mg via INTRAMUSCULAR

## 2017-06-22 MED ORDER — TECHNETIUM TO 99M ALBUMIN AGGREGATED: 4.0000 | Freq: Once | INTRAVENOUS | Status: DC | PRN

## 2017-06-22 MED ORDER — STERILE WATER FOR INJECTION IJ SOLN
INTRAMUSCULAR | Status: AC
  Filled 2017-06-22: qty 10

## 2017-06-23 ENCOUNTER — Ambulatory Visit (HOSPITAL_COMMUNITY)
Admission: RE | Admit: 2017-06-23 | Discharge: 2017-06-23 | Disposition: A | Discharge status: Home / Self Care | Payer: BLUE CROSS/BLUE SHIELD | Source: Ambulatory Visit | Attending: Endocrinology | Admitting: Endocrinology

## 2017-06-23 DIAGNOSIS — C73 Malignant neoplasm of thyroid gland: Secondary | ICD-10-CM

## 2017-06-23 MED ORDER — SODIUM IODIDE I 131 CAPSULE: 75.3000 | Freq: Once | INTRAVENOUS | Status: DC | PRN

## 2017-06-30 ENCOUNTER — Ambulatory Visit (HOSPITAL_COMMUNITY)
Admission: RE | Admit: 2017-06-30 | Discharge: 2017-06-30 | Disposition: A | Discharge status: Home / Self Care | Payer: BLUE CROSS/BLUE SHIELD | Source: Ambulatory Visit | Attending: Endocrinology | Admitting: Endocrinology

## 2017-06-30 DIAGNOSIS — C73 Malignant neoplasm of thyroid gland: Secondary | ICD-10-CM | POA: Diagnosis not present

## 2017-07-08 ENCOUNTER — Ambulatory Visit: Payer: Self-pay

## 2017-07-08 NOTE — Telephone Encounter (Signed)
Phone call from pt. with c/o chest pain.  Reported he has had a "sharp pain" above and below the left breast, and in the middle of his underarm, over the past couple of days.  Stated "it feels like he has been hit hard by something."  Denied any radiation of the pain.  Reported the pain lasts sometimes for 15-20 min., then goes away and comes back.  Also c/o nausea with the pain.  Denied any sweating, or shortness of breath, assoc. with the pain. Hx of CHF.   Advised to go to the ER now, and to have another person drive him.  Pt. verb. Understanding; agreed to go to the ER.       Reason for Disposition . [1] Intermittent  chest pain or "angina" AND [2] increasing in severity or frequency  (Exception: pains lasting a few seconds)  Answer Assessment - Initial Assessment Questions 1. LOCATION: "Where does it hurt?"       Above and below left breast and under arm on left  2. RADIATION: "Does the pain go anywhere else?" (e.g., into neck, jaw, arms, back)     No radiation  3. ONSET: "When did the chest pain begin?" (Minutes, hours or days)      "a couple days" 4. PATTERN "Does the pain come and go, or has it been constant since it started?"  "Does it get worse with exertion?"      Comes and goes "sharp that feels like I've been hit hard"  5. DURATION: "How long does it last" (e.g., seconds, minutes, hours)     About 15-20 minutes; but varies 6. SEVERITY: "How bad is the pain?"  (e.g., Scale 1-10; mild, moderate, or severe)    - MILD (1-3): doesn't interfere with normal activities     - MODERATE (4-7): interferes with normal activities or awakens from sleep    - SEVERE (8-10): excruciating pain, unable to do any normal activities       8/10  At times; intermittent sharp pain 7. CARDIAC RISK FACTORS: "Do you have any history of heart problems or risk factors for heart disease?" (e.g., prior heart attack, angina; high blood pressure, diabetes, being overweight, high cholesterol, smoking, or strong family  history of heart disease)    Hx of CHF, diabetic; does have high blood pressure, does smoke    8. PULMONARY RISK FACTORS: "Do you have any history of lung disease?"  (e.g., blood clots in lung, asthma, emphysema, birth control pills)     Denies blood clots in lung, asthma, or emphysema 9. CAUSE: "What do you think is causing the chest pain?"     unknown 10. OTHER SYMPTOMS: "Do you have any other symptoms?" (e.g., dizziness, nausea, vomiting, sweating, fever, difficulty breathing, cough)       Denies sweating. C/o intermittent nausea; denied shortness of breath   11. PREGNANCY: "Is there any chance you are pregnant?" "When was your last menstrual period?"       N/a  Protocols used: CHEST PAIN-A-AH

## 2017-07-19 ENCOUNTER — Other Ambulatory Visit: Payer: Self-pay | Admitting: Nurse Practitioner

## 2017-07-27 ENCOUNTER — Other Ambulatory Visit: Payer: Self-pay

## 2017-07-27 MED ORDER — ALLOPURINOL 300 MG PO TABS: ORAL_TABLET | ORAL | 1 refills | Status: DC

## 2017-08-11 ENCOUNTER — Other Ambulatory Visit: Payer: Self-pay | Admitting: Cardiology

## 2017-08-11 ENCOUNTER — Ambulatory Visit (INDEPENDENT_AMBULATORY_CARE_PROVIDER_SITE_OTHER): Payer: BLUE CROSS/BLUE SHIELD | Admitting: Nurse Practitioner

## 2017-08-11 ENCOUNTER — Other Ambulatory Visit (INDEPENDENT_AMBULATORY_CARE_PROVIDER_SITE_OTHER): Payer: BLUE CROSS/BLUE SHIELD

## 2017-08-11 ENCOUNTER — Encounter: Payer: Self-pay | Admitting: Nurse Practitioner

## 2017-08-11 VITALS — BP 112/60 | HR 75 | Temp 98.5°F | Resp 16 | Ht 72.0 in | Wt 287.4 lb

## 2017-08-11 DIAGNOSIS — Z1159 Encounter for screening for other viral diseases: Secondary | ICD-10-CM

## 2017-08-11 DIAGNOSIS — Z125 Encounter for screening for malignant neoplasm of prostate: Secondary | ICD-10-CM

## 2017-08-11 DIAGNOSIS — Z13220 Encounter for screening for lipoid disorders: Secondary | ICD-10-CM | POA: Diagnosis not present

## 2017-08-11 DIAGNOSIS — K219 Gastro-esophageal reflux disease without esophagitis: Secondary | ICD-10-CM

## 2017-08-11 DIAGNOSIS — Z0000 Encounter for general adult medical examination without abnormal findings: Secondary | ICD-10-CM | POA: Diagnosis not present

## 2017-08-11 DIAGNOSIS — Z720 Tobacco use: Secondary | ICD-10-CM

## 2017-08-11 DIAGNOSIS — R739 Hyperglycemia, unspecified: Secondary | ICD-10-CM

## 2017-08-11 DIAGNOSIS — Z122 Encounter for screening for malignant neoplasm of respiratory organs: Secondary | ICD-10-CM

## 2017-08-11 DIAGNOSIS — Z9189 Other specified personal risk factors, not elsewhere classified: Secondary | ICD-10-CM | POA: Diagnosis not present

## 2017-08-11 DIAGNOSIS — Z114 Encounter for screening for human immunodeficiency virus [HIV]: Secondary | ICD-10-CM | POA: Diagnosis not present

## 2017-08-11 DIAGNOSIS — M1A0690 Idiopathic chronic gout, unspecified knee, without tophus (tophi): Secondary | ICD-10-CM

## 2017-08-11 LAB — LIPID PANEL
Cholesterol: 128 mg/dL (ref 0–200)
HDL: 25.1 mg/dL — ABNORMAL LOW (ref >39.00)
LDL Cholesterol: 68 mg/dL (ref 0–99)
NonHDL: 102.7
Total CHOL/HDL Ratio: 5
Triglycerides: 175 mg/dL — ABNORMAL HIGH (ref 0.0–149.0)
VLDL: 35 mg/dL (ref 0.0–40.0)

## 2017-08-11 LAB — HEMOGLOBIN A1C: Hgb A1c MFr Bld: 6.2 % (ref 4.6–6.5)

## 2017-08-11 LAB — PSA: PSA: 0.98 ng/mL (ref 0.10–4.00)

## 2017-08-11 NOTE — Assessment & Plan Note (Addendum)
-  Prostate cancer screening and PSA options (with potential risks and benefits of testing vs not testing) were discussed along with recent recs/guidelines. -USPSTF grade A and B recommendations reviewed with patient; age-appropriate recommendations, preventive care, screening tests, etc discussed and encouraged; healthy living encouraged; see AVS for patient education given to patient -Discussed importance of 150 minutes of physical activity weekly,eat 6 servings of fruit/vegetables daily and drink plenty of water and avoid sweet beverages.  - Follow up and care instructions discussed and provided in AVS.  -Reviewed Health Maintenance:  Declines pneumonia vaccination Screening for HIV (human immunodeficiency virus)- HIV antibody; Future Encounter for hepatitis C virus screening test for high risk patient- Hepatitis C antibody; Future Elevated serum glucose- Hemoglobin A1c; Future  Screening for cholesterol level Fasting today, aside from a mountain Dew soda this am - Lipid panel; Future Encounter for screening for lung cancer- Ambulatory Referral for Lung Cancer Scre Screening for prostate cancer- PSA; Future

## 2017-08-11 NOTE — Progress Notes (Signed)
Name: Craig Flynn   MRN: [id number]    DOB: 1960/08/25   Date:08/11/2017       Progress Note  Subjective  Chief Complaint  Chief Complaint  Patient presents with  . CPE    not fasting    HPI Since our last visit on 05/14/17, Craig Flynn has followed up with cardiology for annual visit, established with endocrinology for further management of thyroid carcinoma and thyroid dysfunction and continues monthly follow up with nephrology for CKD stage 5, has not started hemodialysis at this point. Patient presents for annual CPE.  USPSTF grade A and B recommendations:  Diet: no routine exercise, active at work Exercise: tries to watch his diet, daily fruits and vegetables, baked and grilled options, reduced meat intake  Depression: no concerns today Depression screen [facility] 2/9 05/14/2017  Decreased Interest 0  Down, Depressed, Hopeless 0  PHQ - 2 Score 0   Hypertension: BP Readings from Last 3 Encounters:  08/11/17 112/60  06/11/17 (!) 145/64  05/20/17 (!) 100/50   Obesity: Wt Readings from Last 3 Encounters:  08/11/17 287 lb 6.4 oz (130.4 kg)  06/11/17 (!) 304 lb (137.9 kg)  05/20/17 (!) 301 lb (136.5 kg)   BMI Readings from Last 3 Encounters:  08/11/17 38.98 kg/m  06/11/17 41.23 kg/m  05/20/17 38.65 kg/m     Lipids:  Lab Results  Component Value Date   CHOL 167 07/19/2015   CHOL 148 09/22/2014   CHOL 193 05/16/2008   Lab Results  Component Value Date   HDL 19 (L) 07/19/2015   HDL 23 (L) 09/22/2014   HDL 30.7 (L) 05/16/2008   Lab Results  Component Value Date   LDLCALC 105 (H) 07/19/2015   LDLCALC 60 09/22/2014   LDLCALC 143 (H) 05/16/2008   Lab Results  Component Value Date   TRIG 215 (H) 07/19/2015   TRIG 324 (H) 09/22/2014   TRIG 96 05/16/2008   Lab Results  Component Value Date   CHOLHDL 8.8 07/19/2015   CHOLHDL 6.4 09/22/2014   CHOLHDL 6.3 CALC 05/16/2008   No results found for: LDLDIRECT   Glucose:  Glucose, Bld  Date Value Ref Range Status   06/11/2017 91 65 - 99 mg/dL Final  05/14/2017 91 70 - 99 mg/dL Final  03/26/2017 99 65 - 99 mg/dL Final  03/03/2006 130 (H) 70 - 99 mg/dL Final   Glucose-Capillary  Date Value Ref Range Status  01/22/2017 94 65 - 99 mg/dL Final  03/25/2016 102 (H) 65 - 99 mg/dL Final  02/28/2015 151 (H) 65 - 99 mg/dL Final   Alcohol: no Tobacco use: current smoker, active smoker for about 40 years - he is cutting back and trying to quit  STD testing and prevention (chl/gon/syphilis): no concerns, declines HIV, hep C: HIV and Hep C screenings ordered today  Skin cancer: no concerning lesions or moles Colorectal cancer: colonoscopy up to date  Prostate cancer: PSA ordered today Lab Results  Component Value Date   PSA 0.80 11/12/2014   PSA 0.55 05/16/2008   PSA 0.58 03/03/2006   IPSS Questionnaire (AUA-7): Over the past month.   1)  How often have you had a sensation of not emptying your bladder completely after you finish urinating?  0 - Not at all  2)  How often have you had to urinate again less than two hours after you finished urinating? 0 - Not at all  3)  How often have you found you stopped and started again several times  when you urinated?  0 - Not at all  4) How difficult have you found it to postpone urination?  0 - Not at all  5) How often have you had a weak urinary stream?  0 - Not at all  6) How often have you had to push or strain to begin urination?  0 - Not at all  7) How many times did you most typically get up to urinate from the time you went to bed until the time you got up in the morning?  0  Total score:  0-low risk    Lung cancer:  Referral placed for lung ca screening today  Aspirin: 81 mg asa daily ECG:  Not indicated  Vaccinations: declines pneumonia  Advanced Care Planning: A voluntary discussion about advance care planning including the explanation and discussion of advance directives.  Discussed health care proxy and Living will, and the patient DOES have a  living will at present time. If patient does have living will, I have requested they bring this to the clinic to be scanned in to their chart.  Patient Active Problem List   Diagnosis Date Noted  . 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 Thyroid nodule 01/22/2017  . Esophageal spasm 01/09/2016  . Lightheaded 01/09/2016  . Other headache syndrome 01/09/2016  . Ptosis 01/09/2016  . Dizziness 08/27/2015  . Fullness of abdomen 08/27/2015  . Chronic systolic heart failure ([place]) 08/01/2015  . GERD (gastroesophageal reflux disease) 07/29/2015  . Pain in the chest   . Osteoarthritis 05/03/2015  . Hyperlipidemia 02/26/2015  . Diastolic dysfunction 34/28/7681  . Tobacco abuse 02/26/2015  . Atypical chest pain 02/25/2015  . Neck muscle spasm 01/15/2015  . OSA (obstructive sleep apnea) 12/27/2014  . Snoring 11/12/2014  . Routine general medical examination at a health care facility 11/12/2014  . Anemia in chronic kidney disease 11/12/2014  . Left knee pain 09/22/2014  . Chest pain 09/21/2014  . AKI (acute kidney injury) ([place]) 09/21/2014  . Systolic CHF ([place]) 15/72/6203  . Paresthesia of left leg 09/21/2014  . Acute systolic CHF (congestive heart failure) ([place]) 11/17/2013  . CKD (chronic kidney disease) stage 4, GFR 15-29 ml/min ([facility]) 11/17/2013  . Morbid obesity ([place]) 11/17/2013  . Diabetes mellitus ([place]) 12/12/2011  . Acute on chronic renal failure ([place]) 12/11/2011  . Hypokalemia 12/11/2011  . HTN (hypertension) 12/11/2011  . Dyslipidemia 12/31/2006  . Gout 12/31/2006  . Essential hypertension 12/31/2006  . 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 BACKACHE NOS 12/29/2006    Past Surgical History:  Procedure Laterality Date  . AV FISTULA PLACEMENT Left 03/25/2016   Procedure: Creation of Left Arm ARTERIOVENOUS (AV) FISTULA;  Surgeon: Elam Dutch, MD;  Location: [facility] OR;  Service: Vascular;  Laterality: Left;  . COLONOSCOPY W/ BIOPSIES AND POLYPECTOMY     "no  problem"  . LAPAROSCOPIC CHOLECYSTECTOMY    . THYROIDECTOMY  01/22/2017  . THYROIDECTOMY Left 01/22/2017   Procedure: THYROIDECTOMY;  Surgeon: Melida Quitter, MD;  Location: [place];  Service: ENT;  Laterality: Left;  . THYROIDECTOMY Right 03/26/2017   Procedure: COMPLETION OF THYROIDECTOMY;  Surgeon: Melida Quitter, MD;  Location: [facility];  Service: ENT;  Laterality: Right;  . UMBILICAL HERNIA REPAIR    . WISDOM TOOTH EXTRACTION      Family History  Problem Relation Age of Onset  . Stroke Mother   . Pneumonia Father        died of Pneumonia x3  . Kidney disease  Maternal Grandmother   . Hypertension Maternal Grandmother   . Healthy Maternal Grandfather   . Healthy Paternal Grandmother   . Healthy Paternal Grandfather   . CAD Neg Hx   . Colon cancer Neg Hx   . Esophageal cancer Neg Hx   . Pancreatic cancer Neg Hx   . Prostate cancer Neg Hx   . Stomach cancer Neg Hx   . Rectal cancer Neg Hx     Social History   Socioeconomic History  . Marital status: Divorced    Spouse name: Not on file  . Number of children: 2  . Years of education: 45  . Highest education level: Not on file  Occupational History  . Occupation: Disability    Comment: Knees   Social Needs  . Financial resource strain: Not on file  . Food insecurity:    Worry: Not on file    Inability: Not on file  . Transportation needs:    Medical: Not on file    Non-medical: Not on file  Tobacco Use  . Smoking status: Current Every Day Smoker    Packs/day: 0.50    Years: 40.00    Pack years: 20.00    Types: Cigarettes  . Smokeless tobacco: Never Used  Substance and Sexual Activity  . Alcohol use: Yes    Alcohol/week: 0.0 oz    Comment: 01/22/2017 "might have 1 beer/month; if that"  . Drug use: No  . Sexual activity: Yes  Lifestyle  . Physical activity:    Days per week: Not on file    Minutes per session: Not on file  . Stress: Not on file  Relationships  . Social connections:    Talks on phone: Not on file     Gets together: Not on file    Attends religious service: Not on file    Active member of club or organization: Not on file    Attends meetings of clubs or organizations: Not on file    Relationship status: Not on file  . Intimate partner violence:    Fear of current or ex partner: Not on file    Emotionally abused: Not on file    Physically abused: Not on file    Forced sexual activity: Not on file  Other Topics Concern  . Not on file  Social History Narrative   Fun: Golf, basketball    Denies religious beliefs effecting health care.      Current Outpatient Medications:  .  acetaminophen (TYLENOL) 500 MG tablet, Take 1,000 mg by mouth every 6 (six) hours as needed for mild pain or headache. , Disp: , Rfl:  .  allopurinol (ZYLOPRIM) 300 MG tablet, Take 1 tablet by mouth daily, Disp: 30 tablet, Rfl: 1 .  aspirin EC 81 MG tablet, Take 81 mg by mouth daily., Disp: , Rfl:  .  calcitRIOL (ROCALTROL) 0.25 MCG capsule, Take 0.25 mcg by mouth every Monday, Wednesday, and Friday., Disp: , Rfl:  .  carvedilol (COREG) 25 MG tablet, take 1 tablet by mouth twice a day (Patient taking differently: take 1 tablet (25 mg) by mouth twice a day), Disp: 60 tablet, Rfl: 0 .  fenofibrate 54 MG tablet, take 1 tablet by mouth once daily (Patient taking differently: take 1 tablet (54 mg) by mouth once daily), Disp: 30 tablet, Rfl: 6 .  furosemide (LASIX) 80 MG tablet, Take 1 tablet (80 mg total) by mouth 2 (two) times daily. (Patient taking differently: Take 160 mg by mouth 2 (  two) times daily. ), Disp: 60 tablet, Rfl: 1 .  gabapentin (NEURONTIN) 100 MG capsule, Take 1 capsule (100 mg total) by mouth at bedtime., Disp: 30 capsule, Rfl: 0 .  HYDROcodone-acetaminophen (NORCO/VICODIN) 5-325 MG tablet, Take 1-2 tablets by mouth every 6 (six) hours as needed for moderate pain., Disp: 20 tablet, Rfl: 0 .  isosorbide mononitrate (IMDUR) 30 MG 24 hr tablet, TAKE 2 TABLETS(60 MG) BY MOUTH DAILY, Disp: 60 tablet, Rfl:  0 .  levothyroxine (SYNTHROID) 200 MCG tablet, Take 1 tablet (200 mcg total) by mouth daily., Disp: 30 tablet, Rfl: 11 .  nitroGLYCERIN (NITROSTAT) 0.4 MG SL tablet, Place 1 tablet (0.4 mg total) under the tongue every 5 (five) minutes as needed for chest pain., Disp: 25 tablet, Rfl: 3 .  pantoprazole (PROTONIX) 40 MG tablet, Take 1 tablet (40 mg total) by mouth daily., Disp: 30 tablet, Rfl: 3 .  potassium chloride SA (K-DUR,KLOR-CON) 20 MEQ tablet, Take 20 mEq by mouth 2 (two) times daily., Disp: , Rfl:   No Known Allergies   ROS  Constitutional: Negative for fever or weight change.  Respiratory: Negative for cough and shortness of breath.   Cardiovascular: Negative for chest pain or palpitations.  Gastrointestinal: Negative for abdominal pain, no bowel changes.  Musculoskeletal: Negative for gait problem or joint swelling.  Skin: Negative for rash.  Neurological: Negative for dizziness or headache.  No other specific complaints in a complete review of systems (except as listed in HPI above).   Objective  Vitals:   08/11/17 0804  BP: 112/60  Pulse: 75  Resp: 16  Temp: 98.5 F (36.9 C)  TempSrc: Oral  SpO2: 98%  Weight: 287 lb 6.4 oz (130.4 kg)  Height: 6' (1.829 m)    Body mass index is 38.98 kg/m.  Physical Exam Vital signs reviewed. Constitutional: Patient appears well-developed and well-nourished. No distress.  HENT: Head: Normocephalic and atraumatic. Ears: B TMs ok, no erythema or effusion; Nose: Nose normal. Mouth/Throat: Oropharynx is clear and moist. No oropharyngeal exudate.  Eyes: Conjunctivae and EOM are normal. Pupils are equal, round, and reactive to light. No scleral icterus.  Neck: Normal range of motion. Neck supple. No thyromegaly present.  Cardiovascular: Normal rate, regular rhythm and normal heart sounds.  No murmur heard. Distal pulses intact. Pulmonary/Chest: Effort normal and breath sounds normal. No respiratory distress. Abdominal: Soft, rotund.  Bowel sounds are normal. There is no tenderness. no masses Musculoskeletal: Normal range of motion. No gross deformities Neurological: he is alert and oriented to person, place, and time. No cranial nerve deficit. Coordination, balance, strength, speech and gait are normal.  Skin: Skin is warm and dry. No rash noted. No erythema.  Psychiatric: Patient has a normal mood and affect. behavior is normal. Judgment and thought content normal.  Fall Risk: Fall Risk  05/14/2017  Falls in the past year? No    Assessment & Plan RTC in 1 year for CPE  Recent labs reviewed in Epic: CBC, BMET on 06/11/17 stable -continue follow up with nephrology

## 2017-08-11 NOTE — Assessment & Plan Note (Addendum)
Stable Continue allopurinol  Uric acid 05/14/17 WNL

## 2017-08-11 NOTE — Assessment & Plan Note (Signed)
Stable, continue protonix Magnesium 05/14/17 WNL

## 2017-08-11 NOTE — Assessment & Plan Note (Signed)
Asked about tobacco use. he was advised to quit. Assess willingness to make an attempt: he is  ready to quit, has been cutting back on his own Assist in quit attempt: he declines assistance in quitting, says he is going to quit on his own Arrange follow up: encouraged to follow up if he Is unable to quit or would like additional resources or assistance with quitting

## 2017-08-11 NOTE — Patient Instructions (Addendum)
Please head downstairs for lab work/x-rays. If any of your test results are critically abnormal, you will be contacted right away. Your results may be released to your MyChart for viewing before I am able to provide you with my response. I will contact you within a week about your test results and any recommendations for abnormalities.  I have placed a referral to pulmonology for a lung cancer screening. Our office will call you to schedule this appointment. You should hear from our office in 7-10 days.  I will plan to see you back in 1 year for annual physical , or sooner if needed.   Health Maintenance, Male A healthy lifestyle and preventive care is important for your health and wellness. Ask your health care provider about what schedule of regular examinations is right for you. What should I know about weight and diet? Eat a Healthy Diet  Eat plenty of vegetables, fruits, whole grains, low-fat dairy products, and lean protein.  Do not eat a lot of foods high in solid fats, added sugars, or salt.  Maintain a Healthy Weight Regular exercise can help you achieve or maintain a healthy weight. You should:  Do at least 150 minutes of exercise each week. The exercise should increase your heart rate and make you sweat (moderate-intensity exercise).  Do strength-training exercises at least twice a week.  Watch Your Levels of Cholesterol and Blood Lipids  Have your blood tested for lipids and cholesterol every 5 years starting at 58 years of age. If you are at high risk for heart disease, you should start having your blood tested when you are 57 years old. You may need to have your cholesterol levels checked more often if: ? Your lipid or cholesterol levels are high. ? You are older than 57 years of age. ? You are at high risk for heart disease.  What should I know about cancer screening? Many types of cancers can be detected early and may often be prevented. Lung Cancer  You should be  screened every year for lung cancer if: ? You are a current smoker who has smoked for at least 30 years. ? You are a former smoker who has quit within the past 15 years.  Talk to your health care provider about your screening options, when you should start screening, and how often you should be screened.  Colorectal Cancer  Routine colorectal cancer screening usually begins at 57 years of age and should be repeated every 5-10 years until you are 57 years old. You may need to be screened more often if early forms of precancerous polyps or small growths are found. Your health care provider may recommend screening at an earlier age if you have risk factors for colon cancer.  Your health care provider may recommend using home test kits to check for hidden blood in the stool.  A small camera at the end of a tube can be used to examine your colon (sigmoidoscopy or colonoscopy). This checks for the earliest forms of colorectal cancer.  Prostate and Testicular Cancer  Depending on your age and overall health, your health care provider may do certain tests to screen for prostate and testicular cancer.  Talk to your health care provider about any symptoms or concerns you have about testicular or prostate cancer.  Skin Cancer  Check your skin from head to toe regularly.  Tell your health care provider about any new moles or changes in moles, especially if: ? There is a change in  a mole's size, shape, or color. ? You have a mole that is larger than a pencil eraser.  Always use sunscreen. Apply sunscreen liberally and repeat throughout the day.  Protect yourself by wearing long sleeves, pants, a wide-brimmed hat, and sunglasses when outside.  What should I know about heart disease, diabetes, and high blood pressure?  If you are 23-21 years of age, have your blood pressure checked every 3-5 years. If you are 80 years of age or older, have your blood pressure checked every year. You should have  your blood pressure measured twice-once when you are at a hospital or clinic, and once when you are not at a hospital or clinic. Record the average of the two measurements. To check your blood pressure when you are not at a hospital or clinic, you can use: ? An automated blood pressure machine at a pharmacy. ? A home blood pressure monitor.  Talk to your health care provider about your target blood pressure.  If you are between 91-64 years old, ask your health care provider if you should take aspirin to prevent heart disease.  Have regular diabetes screenings by checking your fasting blood sugar level. ? If you are at a normal weight and have a low risk for diabetes, have this test once every three years after the age of 57. ? If you are overweight and have a high risk for diabetes, consider being tested at a younger age or more often.  A one-time screening for abdominal aortic aneurysm (AAA) by ultrasound is recommended for men aged 65-75 years who are current or former smokers. What should I know about preventing infection? Hepatitis B If you have a higher risk for hepatitis B, you should be screened for this virus. Talk with your health care provider to find out if you are at risk for hepatitis B infection. Hepatitis C Blood testing is recommended for:  Everyone born from [birth year] through 1965.  Anyone with known risk factors for hepatitis C.  Sexually Transmitted Diseases (STDs)  You should be screened each year for STDs including gonorrhea and chlamydia if: ? You are sexually active and are younger than 57 years of age. ? You are older than 57 years of age and your health care provider tells you that you are at risk for this type of infection. ? Your sexual activity has changed since you were last screened and you are at an increased risk for chlamydia or gonorrhea. Ask your health care provider if you are at risk.  Talk with your health care provider about whether you are at high risk  of being infected with HIV. Your health care provider may recommend a prescription medicine to help prevent HIV infection.  What else can I do?  Schedule regular health, dental, and eye exams.  Stay current with your vaccines (immunizations).  Do not use any tobacco products, such as cigarettes, chewing tobacco, and e-cigarettes. If you need help quitting, ask your health care provider.  Limit alcohol intake to no more than 2 drinks per day. One drink equals 12 ounces of beer, 5 ounces of wine, or 1 ounces of hard liquor.  Do not use street drugs.  Do not share needles.  Ask your health care provider for help if you need support or information about quitting drugs.  Tell your health care provider if you often feel depressed.  Tell your health care provider if you have ever been abused or do not feel safe at home. This information  is not intended to replace advice given to you by your health care provider. Make sure you discuss any questions you have with your health care provider. Document Released: 09/05/2007 Document Revised: 11/06/2015 Document Reviewed: 12/11/2014 Elsevier Interactive Patient Education  [employer].

## 2017-08-11 NOTE — Telephone Encounter (Signed)
Rx sent to pharmacy   

## 2017-08-12 LAB — HIV ANTIBODY (ROUTINE TESTING W REFLEX): HIV 1&2 Ab, 4th Generation: NONREACTIVE

## 2017-08-12 LAB — HEPATITIS C ANTIBODY
Hepatitis C Ab: NONREACTIVE
SIGNAL TO CUT-OFF: 0.06 (ref <1.00)

## 2017-08-19 ENCOUNTER — Other Ambulatory Visit: Payer: Self-pay | Admitting: Nurse Practitioner

## 2017-08-24 ENCOUNTER — Ambulatory Visit: Payer: Self-pay

## 2017-08-24 NOTE — Telephone Encounter (Signed)
Pt c/o sinus congestion and pain above eyes x 2 weeks. Pt stated pain is mild. Pt stated his noses is blocked intermittently. Pt having clear nasal discharge. Denies fever. Pt also with nonproductive cough. Care advice given per protocol and appt made given for 08/26/17 at 2:30 with PCP.   Reason for Disposition . [1] Sinus congestion (pressure, fullness) AND [2] present > 10 days  Answer Assessment - Initial Assessment Questions 1. LOCATION: "Where does it hurt?"      Above eyes 2. ONSET: "When did the sinus pain start?"  (e.g., hours, days)      2 weeks ago 3. SEVERITY: "How bad is the pain?"   (Scale 1-10; mild, moderate or severe)   - MILD (1-3): doesn't interfere with normal activities    - MODERATE (4-7): interferes with normal activities (e.g., work or school) or awakens from sleep   - SEVERE (8-10): excruciating pain and patient unable to do any normal activities        mild 4. RECURRENT SYMPTOM: "Have you ever had sinus problems before?" If so, ask: "When was the last time?" and "What happened that time?"      no 5. NASAL CONGESTION: "Is the nose blocked?" If so, ask, "Can you open it or must you breathe through the mouth?"     Nose is blocked intermittently 6. NASAL DISCHARGE: "Do you have discharge from your nose?" If so ask, "What color?"     Yes-clear mucus 7. FEVER: "Do you have a fever?" If so, ask: "What is it, how was it measured, and when did it start?"      no 8. OTHER SYMPTOMS: "Do you have any other symptoms?" (e.g., sore throat, cough, earache, difficulty breathing)   Intermittent difficulty breathing, non productive cough 9. PREGNANCY: "Is there any chance you are pregnant?" "When was your last menstrual period?"     n/a  Protocols used: SINUS PAIN OR CONGESTION-A-AH

## 2017-08-26 ENCOUNTER — Ambulatory Visit (INDEPENDENT_AMBULATORY_CARE_PROVIDER_SITE_OTHER): Payer: BLUE CROSS/BLUE SHIELD | Admitting: Nurse Practitioner

## 2017-08-26 ENCOUNTER — Encounter: Payer: Self-pay | Admitting: Nurse Practitioner

## 2017-08-26 VITALS — BP 130/60 | HR 78 | Temp 98.5°F | Resp 18 | Ht 72.0 in | Wt 289.0 lb

## 2017-08-26 DIAGNOSIS — R05 Cough: Secondary | ICD-10-CM | POA: Diagnosis not present

## 2017-08-26 DIAGNOSIS — R059 Cough, unspecified: Secondary | ICD-10-CM

## 2017-08-26 MED ORDER — PANTOPRAZOLE SODIUM 40 MG PO TBEC
40.0000 mg | DELAYED_RELEASE_TABLET | Freq: Every day | ORAL | 1 refills | Status: DC
Start: 2017-08-26 — End: 2017-10-11

## 2017-08-26 MED ORDER — AMOXICILLIN-POT CLAVULANATE 875-125 MG PO TABS
1.0000 | ORAL_TABLET | Freq: Two times a day (BID) | ORAL | 0 refills | Status: DC
Start: 2017-08-26 — End: 2018-01-10

## 2017-08-26 MED ORDER — FLUTICASONE PROPIONATE 50 MCG/ACT NA SUSP: 2.0000 | Freq: Every day | NASAL | 1 refills | Status: DC

## 2017-08-26 NOTE — Patient Instructions (Addendum)
I have sent a prescription for an antibiotic augmentin twice daily for 7 days.  I have sent a prescription for flonase nasal spray to your pharmacy- you may start with 2 sprays in each nostril daily then reduce to 1 spray in each nostril daily when your symptoms improve You may also take an over the counter allergy medication such as claritin or zyrtec for your symptoms.  Please follow up if you are not feeling any better by the first of next week.   Cough, Adult A cough helps to clear your throat and lungs. A cough may last only 2-3 weeks (acute), or it may last longer than 8 weeks (chronic). Many different things can cause a cough. A cough may be a sign of an illness or another medical condition. Follow these instructions at home:  Pay attention to any changes in your cough.  Take medicines only as told by your doctor. ? If you were prescribed an antibiotic medicine, take it as told by your doctor. Do not stop taking it even if you start to feel better. ? Talk with your doctor before you try using a cough medicine.  Drink enough fluid to keep your pee (urine) clear or pale yellow.  If the air is dry, use a cold steam vaporizer or humidifier in your home.  Stay away from things that make you cough at work or at home.  If your cough is worse at night, try using extra pillows to raise your head up higher while you sleep.  Do not smoke, and try not to be around smoke. If you need help quitting, ask your doctor.  Do not have caffeine.  Do not drink alcohol.  Rest as needed. Contact a doctor if:  You have new problems (symptoms).  You cough up yellow fluid (pus).  Your cough does not get better after 2-3 weeks, or your cough gets worse.  Medicine does not help your cough and you are not sleeping well.  You have pain that gets worse or pain that is not helped with medicine.  You have a fever.  You are losing weight and you do not know why.  You have night sweats. Get help  right away if:  You cough up blood.  You have trouble breathing.  Your heartbeat is very fast. This information is not intended to replace advice given to you by your health care provider. Make sure you discuss any questions you have with your health care provider. Document Released: 11/20/2010 Document Revised: 08/15/2015 Document Reviewed: 05/16/2014 Elsevier Interactive Patient Education  [employer].

## 2017-08-26 NOTE — Progress Notes (Signed)
Name: Craig Flynn   MRN: [id number]    DOB: 1960/04/05   Date:08/26/2017       Progress Note  Subjective  Chief Complaint  Chief Complaint  Patient presents with  . Cough    sinus congestion and headache, cough, SOB     HPI  This is an acute complaint. His symptoms began about 2 weeks ago. He c/o headaches, sneezing, nasal congestion, rhinorrhea, clear nasal drainage, chest congestion, shortness of breath, productive cough with white sputum He denies weakness, syncope, fevers, fatigue, body aches, sore throat, abdominal pain, nausea, vomiting. Feels okay overall, just feels his symptoms are not getting better He has been using saline solution and tylenol sinus with no improvement He avoids OTC meds due to renal function   Patient Active Problem List   Diagnosis Date Noted  . 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 Thyroid nodule 01/22/2017  . Ptosis 01/09/2016  . Chronic systolic heart failure ([place]) 08/01/2015  . GERD (gastroesophageal reflux disease) 07/29/2015  . Osteoarthritis 05/03/2015  . Hyperlipidemia 02/26/2015  . Diastolic dysfunction 01/75/1025  . Tobacco abuse 02/26/2015  . OSA (obstructive sleep apnea) 12/27/2014  . Routine general medical examination at a health care facility 11/12/2014  . Anemia in chronic kidney disease 11/12/2014  . Left knee pain 09/22/2014  . Systolic CHF ([place]) 85/27/7824  . Paresthesia of left leg 09/21/2014  . Acute systolic CHF (congestive heart failure) ([place]) 11/17/2013  . CKD (chronic kidney disease) stage 4, GFR 15-29 ml/min (HCC) 11/17/2013  . Morbid obesity ([place]) 11/17/2013  . Diabetes mellitus ([place]) 12/12/2011  . Acute on chronic renal failure ([place]) 12/11/2011  . Hypokalemia 12/11/2011  . HTN (hypertension) 12/11/2011  . Gout 12/31/2006  . Essential hypertension 12/31/2006  . HERNIATED LUMBAR DISK WITH RADICULOPATHY 12/30/2006    Social History   Tobacco Use  . Smoking status: Current Every Day Smoker   Packs/day: 0.50    Years: 40.00    Pack years: 20.00    Types: Cigarettes  . Smokeless tobacco: Never Used  Substance Use Topics  . Alcohol use: Yes    Alcohol/week: 0.0 oz    Comment: 01/22/2017 "might have 1 beer/month; if that"     Current Outpatient Medications:  .  acetaminophen (TYLENOL) 500 MG tablet, Take 1,000 mg by mouth every 6 (six) hours as needed for mild pain or headache. , Disp: , Rfl:  .  allopurinol (ZYLOPRIM) 300 MG tablet, Take 1 tablet by mouth daily, Disp: 30 tablet, Rfl: 1 .  aspirin EC 81 MG tablet, Take 81 mg by mouth daily., Disp: , Rfl:  .  calcitRIOL (ROCALTROL) 0.25 MCG capsule, Take 0.25 mcg by mouth every Monday, Wednesday, and Friday., Disp: , Rfl:  .  carvedilol (COREG) 25 MG tablet, TAKE 1 TABLET BY MOUTH TWICE A DAY, Disp: 60 tablet, Rfl: 3 .  fenofibrate 54 MG tablet, take 1 tablet by mouth once daily (Patient taking differently: take 1 tablet (54 mg) by mouth once daily), Disp: 30 tablet, Rfl: 6 .  furosemide (LASIX) 80 MG tablet, Take 1 tablet (80 mg total) by mouth 2 (two) times daily. (Patient taking differently: Take 160 mg by mouth 2 (two) times daily. ), Disp: 60 tablet, Rfl: 1 .  gabapentin (NEURONTIN) 100 MG capsule, Take 1 capsule (100 mg total) by mouth at bedtime., Disp: 30 capsule, Rfl: 0 .  HYDROcodone-acetaminophen (NORCO/VICODIN) 5-325 MG tablet, Take 1-2 tablets by mouth every 6 (six) hours as needed for moderate pain., Disp: 20  tablet, Rfl: 0 .  isosorbide mononitrate (IMDUR) 30 MG 24 hr tablet, TAKE 2 TABLETS(60 MG) BY MOUTH DAILY, Disp: 60 tablet, Rfl: 0 .  levothyroxine (SYNTHROID) 200 MCG tablet, Take 1 tablet (200 mcg total) by mouth daily., Disp: 30 tablet, Rfl: 11 .  nitroGLYCERIN (NITROSTAT) 0.4 MG SL tablet, Place 1 tablet (0.4 mg total) under the tongue every 5 (five) minutes as needed for chest pain., Disp: 25 tablet, Rfl: 3 .  pantoprazole (PROTONIX) 40 MG tablet, Take 1 tablet (40 mg total) by mouth daily., Disp: 30 tablet,  Rfl: 3 .  potassium chloride SA (K-DUR,KLOR-CON) 20 MEQ tablet, Take 20 mEq by mouth 2 (two) times daily., Disp: , Rfl:   No Known Allergies  ROS  No other specific complaints in a complete review of systems (except as listed in HPI above).  Objective  Vitals:   08/26/17 1432  BP: 130/60  Pulse: 78  Resp: 18  Temp: 98.5 F (36.9 C)  TempSrc: Oral  SpO2: 96%  Weight: 289 lb (131.1 kg)  Height: 6' (1.829 m)    Body mass index is 39.2 kg/m.  Nursing Note and Vital Signs reviewed.  Physical Exam  Constitutional: Patient appears well-developed and well-nourished. No distress.  HEENT: head atraumatic, normocephalic, pupils equal and reactive to light, EOM's intact, TM's bulging bilaterally without erythema, no maxillary or frontal sinus tenderness , neck supple without lymphadenopathy, oropharynx pink and moist without exudate Cardiovascular: Normal rate, regular rhythm, S1/S2 present. Distal pulses intact. Pulmonary/Chest: Effort normal and breath sounds clear. No respiratory distress or retractions. Neurological: he is alert and oriented to person, place, and time.Coordination, balance, strength, speech and gait are normal.  Skin: Skin is warm and dry. No rash noted. No erythema.  Psychiatric: Patient has a normal mood and affect. behavior is normal. Judgment and thought content normal.   Assessment & Plan  Cough Will rx course of augmentin due to duration of symptoms, no improvement. Flonase for congestion. Medication dosing and side effects were discussed -Red flags and when to present for emergency care or RTC including fever >101.18F, chest pain, shortness of breath, new/worsening/un-resolving symptoms,   reviewed with patient at time of visit. Follow up and care instructions discussed and provided in AVS. - amoxicillin-clavulanate (AUGMENTIN) 875-125 MG tablet; Take 1 tablet by mouth 2 (two) times daily.  Dispense: 14 tablet; Refill: 0 - fluticasone (FLONASE) 50  MCG/ACT nasal spray; Place 2 sprays into both nostrils daily.  Dispense: 16 g; Refill: 1

## 2017-09-02 ENCOUNTER — Other Ambulatory Visit: Payer: Self-pay

## 2017-09-02 MED ORDER — ALLOPURINOL 300 MG PO TABS: ORAL_TABLET | ORAL | 1 refills | Status: DC

## 2017-09-29 ENCOUNTER — Other Ambulatory Visit: Payer: Self-pay | Admitting: Nurse Practitioner

## 2017-09-30 ENCOUNTER — Other Ambulatory Visit: Payer: Self-pay | Admitting: Cardiology

## 2017-09-30 ENCOUNTER — Other Ambulatory Visit: Payer: Self-pay

## 2017-09-30 MED ORDER — ALLOPURINOL 300 MG PO TABS: ORAL_TABLET | ORAL | 1 refills | Status: DC

## 2017-10-10 ENCOUNTER — Other Ambulatory Visit: Payer: Self-pay | Admitting: Cardiovascular Disease

## 2017-10-11 ENCOUNTER — Other Ambulatory Visit: Payer: Self-pay

## 2017-10-11 MED ORDER — PANTOPRAZOLE SODIUM 40 MG PO TBEC: 40.0000 mg | DELAYED_RELEASE_TABLET | Freq: Every day | ORAL | 1 refills | Status: DC

## 2017-10-28 ENCOUNTER — Other Ambulatory Visit: Payer: Self-pay | Admitting: Nurse Practitioner

## 2017-11-25 ENCOUNTER — Other Ambulatory Visit: Payer: Self-pay | Admitting: Nurse Practitioner

## 2017-12-09 ENCOUNTER — Other Ambulatory Visit: Payer: Self-pay | Admitting: Nurse Practitioner

## 2017-12-13 IMAGING — DX DG CHEST 2V
2 series · 2 of 2 positions shown · non-contrast
Comparison: 02/25/2015

CLINICAL DATA: Left-sided chest pain for 2 days, initial encounter

EXAM:
CHEST  2 VIEW

[chest pa]
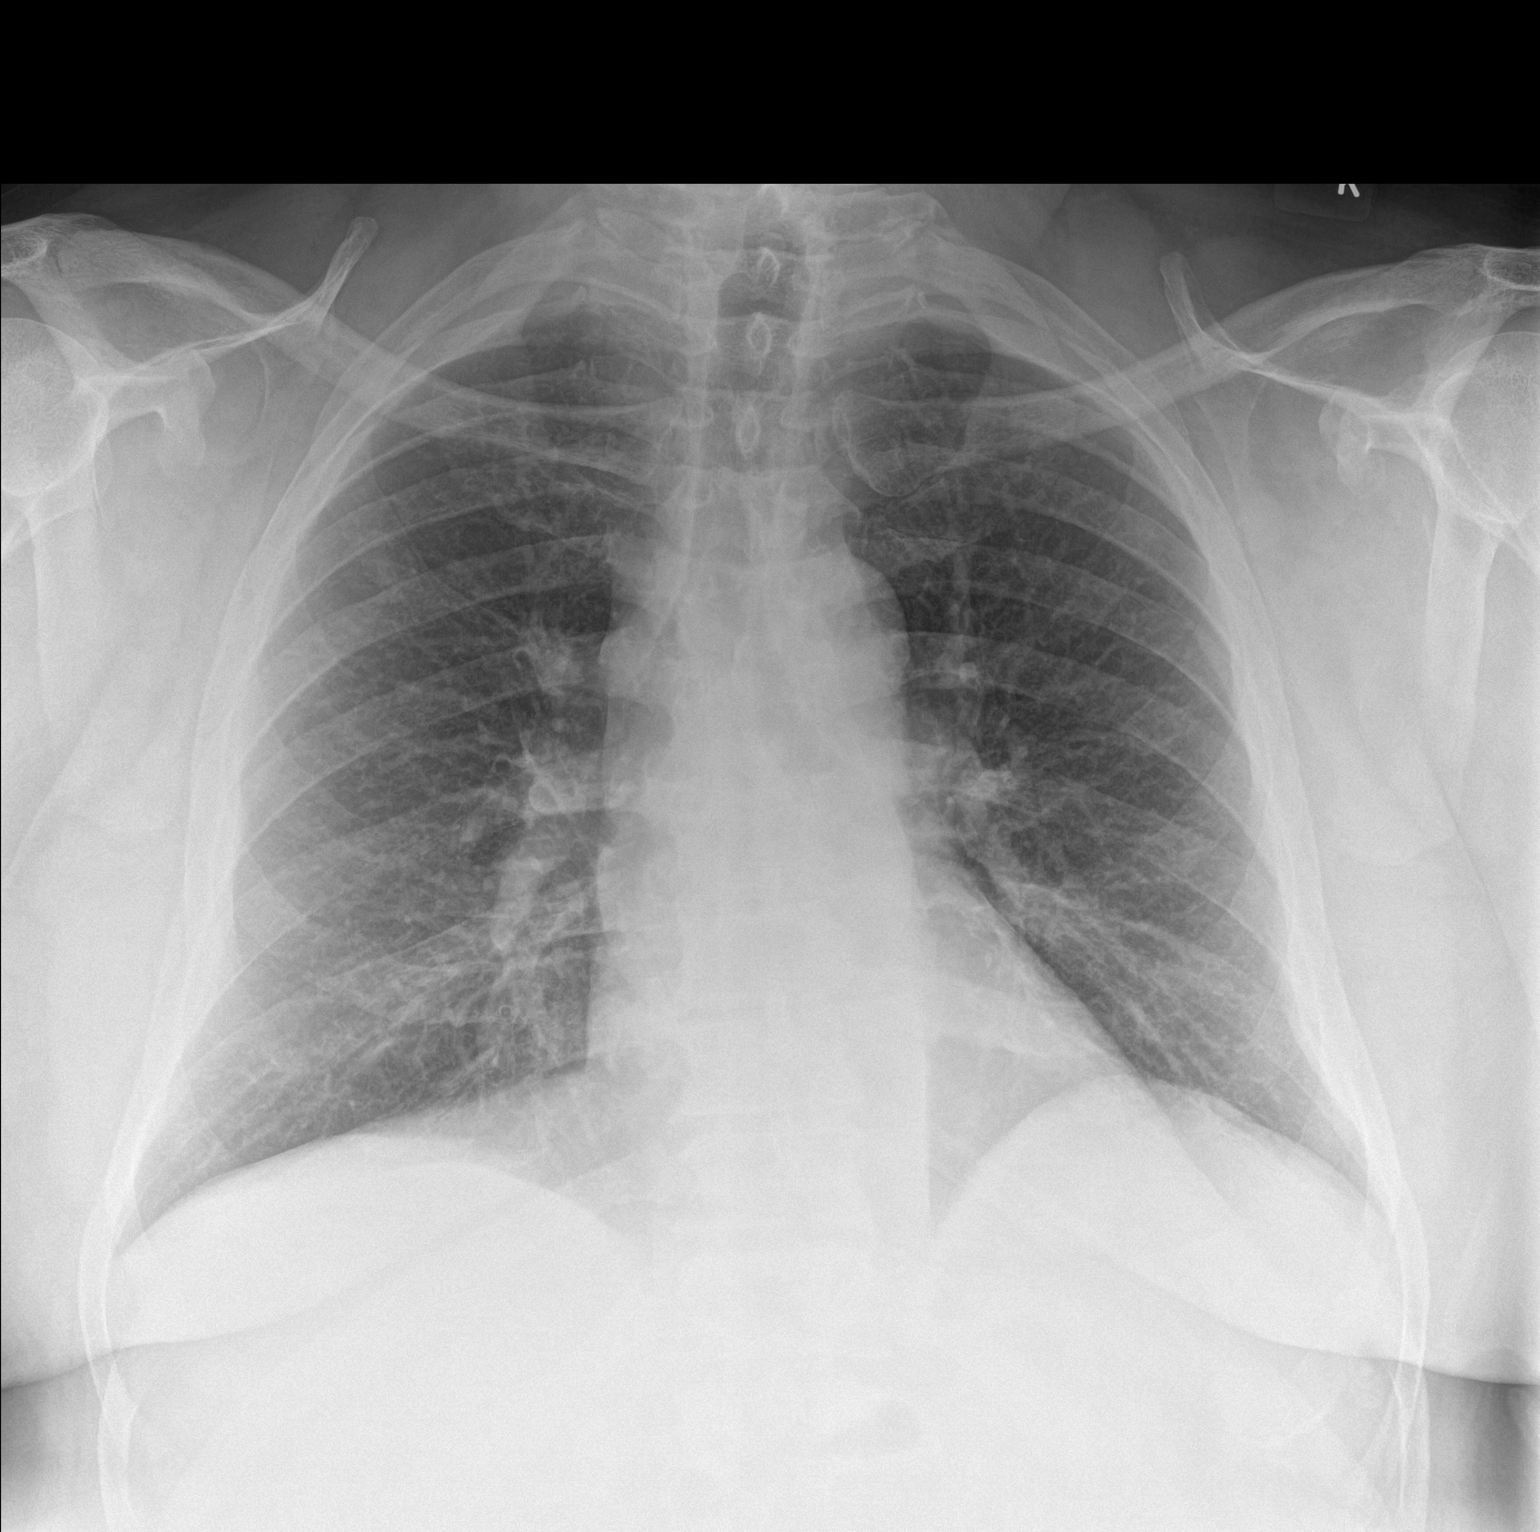

[chest lat]
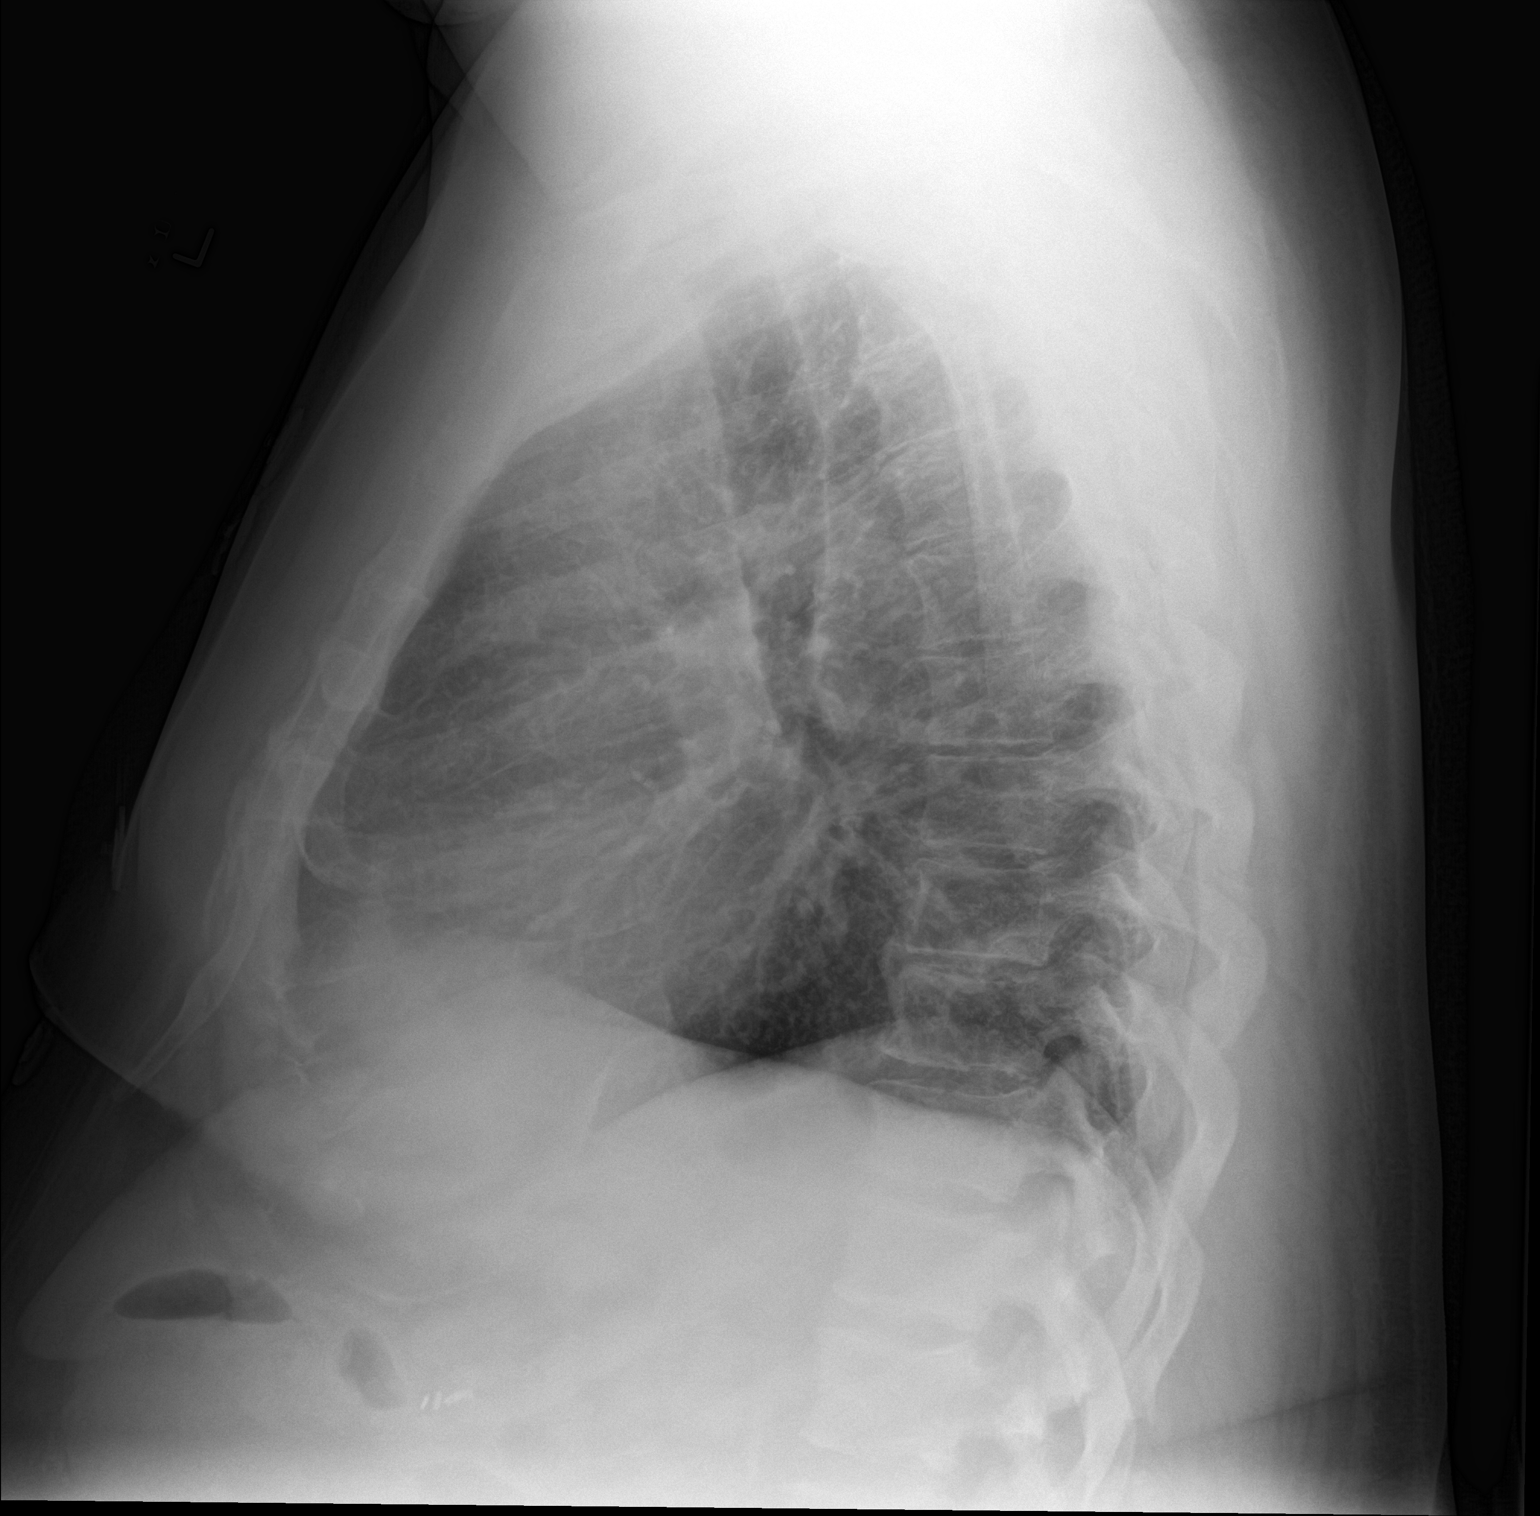

[2 of 2 positions shown; findings below may reference images not displayed]

FINDINGS: The heart size and mediastinal contours are within normal limits.
Both lungs are clear. The visualized skeletal structures are
unremarkable.
IMPRESSION: No active cardiopulmonary disease.

## 2017-12-16 ENCOUNTER — Other Ambulatory Visit: Payer: Self-pay

## 2017-12-16 ENCOUNTER — Ambulatory Visit (INDEPENDENT_AMBULATORY_CARE_PROVIDER_SITE_OTHER): Payer: BLUE CROSS/BLUE SHIELD | Admitting: Physician Assistant

## 2017-12-16 VITALS — BP 107/57 | HR 77 | Temp 97.3°F | Resp 18 | Ht 74.0 in | Wt 282.9 lb

## 2017-12-16 DIAGNOSIS — N185 Chronic kidney disease, stage 5: Secondary | ICD-10-CM | POA: Diagnosis not present

## 2017-12-16 NOTE — Progress Notes (Signed)
HISTORY AND PHYSICAL     CC:  Pain in his left arm around fistula Requesting Provider:  Lance Sell, NP  HPI: This is a 57 y.o. male who had a left BC AVF creation on 03/25/16 by Dr. Oneida Alar.  He foolowed up in October 2018 with Dr. Oneida Alar and at that time he was not on dialysis as he was CKD 5.  He did not have any evidence of steal sx.    He presents today for pain in his left arm last week.  He state he got a sharp pain on the inner portion of his arm (away from the fistula).  He states it didn't last too long.  He is not yet on dialysis.  He states that his kidney function has improved from 6% to 18%.  He is currently on the transplant list.    He states he has a twinge in his left chest that he gets occasionally that lasts only a couple of seconds. He has had this in the past.  This does not radiate.  He says he has an appointment this afternoon with his cardiologist.   He takes aspirin daily.  He is on a beta blocker for blood pressure control.   Past Medical History:  Diagnosis Date  . Anemia 06/2015  . Arthritis    knee  . Cancer [facility])    thyroid cancer  . Cardiomyopathy ([place])    a. 10/2013: EF reduced to 35-40% b. 09/2014: EF improved to 55-60%, Grade 1 DD noted.  . Chest pain 06/2015  . CHF (congestive heart failure) ([place])   . CKD (chronic kidney disease), stage IV [facility])    sees  Dr Lorrene Reid  . Dyspnea    with exerion   . GERD (gastroesophageal reflux disease)   . Gout   . Hypertension   . Obesity   . Sleep apnea    not wearing c-pap now, needs new slep study per pt. (01/22/2017)  . Tuberculosis 1981   while the pt was in the service.  . Tubular adenoma of colon 10/2015  . Wears glasses     Past Surgical History:  Procedure Laterality Date  . AV FISTULA PLACEMENT Left 03/25/2016   Procedure: Creation of Left Arm ARTERIOVENOUS (AV) FISTULA;  Surgeon: Elam Dutch, MD;  [facility];  Service: Vascular;  Laterality: Left;  . COLONOSCOPY W/ BIOPSIES AND  POLYPECTOMY     "no problem"  . LAPAROSCOPIC CHOLECYSTECTOMY    . THYROIDECTOMY  01/22/2017  . THYROIDECTOMY Left 01/22/2017   Procedure: THYROIDECTOMY;  Surgeon: Melida Quitter, MD;  Location: [place];  Service: ENT;  Laterality: Left;  . THYROIDECTOMY Right 03/26/2017   Procedure: COMPLETION OF THYROIDECTOMY;  Surgeon: Melida Quitter, MD;  Location: [place];  Service: ENT;  Laterality: Right;  . UMBILICAL HERNIA REPAIR    . WISDOM TOOTH EXTRACTION      No Known Allergies  Current Outpatient Medications  Medication Sig Dispense Refill  . acetaminophen (TYLENOL) 500 MG tablet Take 1,000 mg by mouth every 6 (six) hours as needed for mild pain or headache.     . allopurinol (ZYLOPRIM) 300 MG tablet Take 1 tablet by mouth daily 90 tablet 1  . amoxicillin-clavulanate (AUGMENTIN) 875-125 MG tablet Take 1 tablet by mouth 2 (two) times daily. 14 tablet 0  . aspirin EC 81 MG tablet Take 81 mg by mouth daily.    . calcitRIOL (ROCALTROL) 0.25 MCG capsule Take 0.25 mcg by mouth every Monday, Wednesday,  and Friday.    . carvedilol (COREG) 25 MG tablet TAKE 1 TABLET BY MOUTH TWICE DAILY 180 tablet 2  . fenofibrate 54 MG tablet TAKE 1 TABLET BY MOUTH ONCE DAILY 90 tablet 2  . fluticasone (FLONASE) 50 MCG/ACT nasal spray Place 2 sprays into both nostrils daily. 16 g 1  . furosemide (LASIX) 80 MG tablet Take 1 tablet (80 mg total) by mouth 2 (two) times daily. (Patient taking differently: Take 160 mg by mouth 2 (two) times daily. ) 60 tablet 1  . gabapentin (NEURONTIN) 100 MG capsule Take 1 capsule (100 mg total) by mouth at bedtime. 30 capsule 0  . HYDROcodone-acetaminophen (NORCO/VICODIN) 5-325 MG tablet Take 1-2 tablets by mouth every 6 (six) hours as needed for moderate pain. 20 tablet 0  . isosorbide mononitrate (IMDUR) 30 MG 24 hr tablet TAKE 2 TABLETS(60 MG) BY MOUTH DAILY 60 tablet 5  . levothyroxine (SYNTHROID) 200 MCG tablet Take 1 tablet (200 mcg total) by mouth daily. 30 tablet 11  . nitroGLYCERIN  (NITROSTAT) 0.4 MG SL tablet Place 1 tablet (0.4 mg total) under the tongue every 5 (five) minutes as needed for chest pain. 25 tablet 3  . pantoprazole (PROTONIX) 40 MG tablet TAKE 1 TABLET(40 MG) BY MOUTH DAILY 30 tablet 5  . potassium chloride SA (K-DUR,KLOR-CON) 20 MEQ tablet Take 20 mEq by mouth 2 (two) times daily.     No current facility-administered medications for this visit.     Family History  Problem Relation Age of Onset  . Stroke Mother   . Pneumonia Father        died of Pneumonia x3  . Kidney disease Maternal Grandmother   . Hypertension Maternal Grandmother   . Healthy Maternal Grandfather   . Healthy Paternal Grandmother   . Healthy Paternal Grandfather   . CAD Neg Hx   . Colon cancer Neg Hx   . Esophageal cancer Neg Hx   . Pancreatic cancer Neg Hx   . Prostate cancer Neg Hx   . Stomach cancer Neg Hx   . Rectal cancer Neg Hx     Social History   Socioeconomic History  . Marital status: Divorced    Spouse name: Not on file  . Number of children: 2  . Years of education: 39  . Highest education level: Not on file  Occupational History  . Occupation: Disability    Comment: Knees   Social Needs  . Financial resource strain: Not on file  . Food insecurity:    Worry: Not on file    Inability: Not on file  . Transportation needs:    Medical: Not on file    Non-medical: Not on file  Tobacco Use  . Smoking status: Current Every Day Smoker    Packs/day: 0.50    Years: 40.00    Pack years: 20.00    Types: Cigarettes  . Smokeless tobacco: Never Used  Substance and Sexual Activity  . Alcohol use: Yes    Alcohol/week: 0.0 standard drinks    Comment: 01/22/2017 "might have 1 beer/month; if that"  . Drug use: No  . Sexual activity: Yes  Lifestyle  . Physical activity:    Days per week: Not on file    Minutes per session: Not on file  . Stress: Not on file  Relationships  . Social connections:    Talks on phone: Not on file    Gets together: Not on  file    Attends religious service: Not on  file    Active member of club or organization: Not on file    Attends meetings of clubs or organizations: Not on file    Relationship status: Not on file  . Intimate partner violence:    Fear of current or ex partner: Not on file    Emotionally abused: Not on file    Physically abused: Not on file    Forced sexual activity: Not on file  Other Topics Concern  . Not on file  Social History Narrative   Fun: Golf, basketball    Denies religious beliefs effecting health care.      REVIEW OF SYSTEMS:   [X]  denotes positive finding, [ ]  denotes negative finding Cardiac  Comments:  Chest pain or chest pressure: x See HPI  Shortness of breath upon exertion:    Short of breath when lying flat:    Irregular heart rhythm:        Vascular    Pain in calf, thigh, or hip brought on by ambulation:    Pain in feet at night that wakes you up from your sleep:     Blood clot in your veins:    Leg swelling:         Pulmonary    Oxygen at home:    Productive cough:     Wheezing:         Neurologic    Sudden weakness in arms or legs:     Sudden numbness in arms or legs:     Sudden onset of difficulty speaking or slurred speech:    Temporary loss of vision in one eye:     Problems with dizziness:         Gastrointestinal    Blood in stool:     Vomited blood:         Genitourinary    Burning when urinating:     Blood in urine:        Psychiatric    Major depression:         Hematologic    Bleeding problems:    Problems with blood clotting too easily:        Skin    Rashes or ulcers:        Constitutional    Fever or chills:      PHYSICAL EXAMINATION:  Vitals:   12/16/17 1304  BP: (!) 107/57  Pulse: 77  Resp: 18  Temp: (!) 97.3 F (36.3 C)  SpO2: 100%   Vitals:   12/16/17 1304  Weight: 282 lb 14.4 oz (128.3 kg)  Height: 6\' 2"  (1.88 m)   Body mass index is 36.32 kg/m.  General:  WDWN in NAD; vital signs documented  above Gait: Normal HENT: WNL, normocephalic Pulmonary: normal non-labored breathing , without Rales, rhonchi,  wheezing Cardiac: regular HR, without  Murmurs without carotid bruits Skin: without rashes Vascular Exam/Pulses: There is an excellent thrill in the distal portion of the fistula but this does fade going proximal.  There are 2 large competing branches ~ 2.5 cm proximal to the anastomosis.  The medial portion of the upper arm where he had the pain is away from the fistula and appears normal.  Extremities: left radial pulse is easily palpable; his motor and sensation are in tact and he has a strong grip on the left.   Neurologic: A&O X 3;  No focal weakness or paresthesias are detected Psychiatric:  The pt has Normal affect.   Non-Invasive Vascular Imaging:  None today  Pt meds includes: Statin:  No. Beta Blocker:  Yes.   Aspirin:  Yes.   ACEI:  No. ARB:  No. CCB use:  No Other Antiplatelet/Anticoagulant:  No   ASSESSMENT/PLAN:: 57 y.o. male with CKD 5 and not yet on dialysis with non maturing fistula   -pt is not yet on dialysis and states his renal function has improved.  Upon examination today, his fistula is not maturing.   He does have 2 large competing branches and the thrill in the fistula decreases proximally.  Will have him return next week on Thursday with duplex to evaluate if he has a narrowing of his fistula prior to proceeding with ligation of branches.  Pt also seen and examined with Dr. Oneida Alar and pt is in agreement with this plan.    Leontine Locket, PA-C [facility] [phone]  Clinic MD: pt seen and examined with Dr. Oneida Alar

## 2017-12-23 ENCOUNTER — Ambulatory Visit (INDEPENDENT_AMBULATORY_CARE_PROVIDER_SITE_OTHER): Payer: BLUE CROSS/BLUE SHIELD | Admitting: Physician Assistant

## 2017-12-23 ENCOUNTER — Other Ambulatory Visit: Payer: Self-pay

## 2017-12-23 ENCOUNTER — Ambulatory Visit (HOSPITAL_COMMUNITY)
Admission: RE | Admit: 2017-12-23 | Discharge: 2017-12-23 | Disposition: A | Discharge status: Home / Self Care | Payer: BLUE CROSS/BLUE SHIELD | Source: Ambulatory Visit | Attending: Family | Admitting: Family

## 2017-12-23 VITALS — BP 114/61 | HR 67 | Temp 97.3°F | Resp 18 | Ht 74.0 in | Wt 283.0 lb

## 2017-12-23 DIAGNOSIS — N185 Chronic kidney disease, stage 5: Secondary | ICD-10-CM | POA: Diagnosis not present

## 2017-12-23 DIAGNOSIS — Z9889 Other specified postprocedural states: Secondary | ICD-10-CM | POA: Insufficient documentation

## 2017-12-23 DIAGNOSIS — N184 Chronic kidney disease, stage 4 (severe): Secondary | ICD-10-CM

## 2017-12-23 NOTE — Progress Notes (Signed)
Established Dialysis Access   History of Present Illness   Craig Flynn is a 57 y.o. (03-21-1961) male who presents for re-evaluation of permanent access.  He is status post left brachiocephalic fistula creation by Dr. Oneida Alar on 03/2016.  As of today patient is still not requiring hemodialysis.  He returns to office to go over a fistula duplex due to what is believed to be a non-maturing fistula with collateral branch development.  He denies any signs or symptoms of a steal syndrome.  He is on the transplant list.  Current Outpatient Medications  Medication Sig Dispense Refill  . acetaminophen (TYLENOL) 500 MG tablet Take 1,000 mg by mouth every 6 (six) hours as needed for mild pain or headache.     . allopurinol (ZYLOPRIM) 300 MG tablet Take 1 tablet by mouth daily 90 tablet 1  . aspirin EC 81 MG tablet Take 81 mg by mouth daily.    . calcitRIOL (ROCALTROL) 0.25 MCG capsule Take 0.25 mcg by mouth every Monday, Wednesday, and Friday.    . carvedilol (COREG) 25 MG tablet TAKE 1 TABLET BY MOUTH TWICE DAILY 180 tablet 2  . fenofibrate 54 MG tablet TAKE 1 TABLET BY MOUTH ONCE DAILY 90 tablet 2  . fluticasone (FLONASE) 50 MCG/ACT nasal spray Place 2 sprays into both nostrils daily. 16 g 1  . furosemide (LASIX) 80 MG tablet Take 1 tablet (80 mg total) by mouth 2 (two) times daily. (Patient taking differently: Take 160 mg by mouth 2 (two) times daily. ) 60 tablet 1  . isosorbide mononitrate (IMDUR) 30 MG 24 hr tablet TAKE 2 TABLETS(60 MG) BY MOUTH DAILY 60 tablet 5  . levothyroxine (SYNTHROID) 200 MCG tablet Take 1 tablet (200 mcg total) by mouth daily. 30 tablet 11  . nitroGLYCERIN (NITROSTAT) 0.4 MG SL tablet Place 1 tablet (0.4 mg total) under the tongue every 5 (five) minutes as needed for chest pain. 25 tablet 3  . pantoprazole (PROTONIX) 40 MG tablet TAKE 1 TABLET(40 MG) BY MOUTH DAILY 30 tablet 5  . potassium chloride SA (K-DUR,KLOR-CON) 20 MEQ tablet Take 20 mEq by mouth 2 (two) times  daily.    Marland Kitchen amoxicillin-clavulanate (AUGMENTIN) 875-125 MG tablet Take 1 tablet by mouth 2 (two) times daily. (Patient not taking: Reported on 12/23/2017) 14 tablet 0  . gabapentin (NEURONTIN) 100 MG capsule Take 1 capsule (100 mg total) by mouth at bedtime. (Patient not taking: Reported on 12/23/2017) 30 capsule 0  . HYDROcodone-acetaminophen (NORCO/VICODIN) 5-325 MG tablet Take 1-2 tablets by mouth every 6 (six) hours as needed for moderate pain. (Patient not taking: Reported on 12/23/2017) 20 tablet 0   No current facility-administered medications for this visit.     Physical Examination   Vitals:   12/23/17 1515  BP: 114/61  Pulse: 67  Resp: 18  Temp: (!) 97.3 F (36.3 C)  TempSrc: Oral  SpO2: 98%  Weight: 283 lb (128.4 kg)  Height: 6\' 2"  (1.88 m)   Body mass index is 36.34 kg/m.  General Alert, O x 3, WD  Pulmonary Sym exp, good B air movt,   Cardiac RRR, Nl S1, S2  Vascular Vessel Right Left  Radial Palpable Palpable  Brachial Palpable Palpable  Ulnar Not palpable Not palpable    Musculo- skeletal  palpable thrill near [facility] fossa of left arm; audible bruit upper arm; palpable large collateral branch development distal upper arm   Neurologic A&O; CN grossly intact     Non-invasive Vascular Imaging  left Arm Access Duplex 12/23/17  Patent fistula with diameter ranging from 0.4 to 0.5 cm with a depth ranging from 0.3 to 0.5 cm  Numerous branches and tortuosity noted    Medical Decision Making   Craig Flynn is a 57 y.o. male who presents with chronic kidney disease stage not yet requiring hemodialysis    Patent fistula despite tortuosity and numerous side branches  Due to slight narrowing seen on fistula duplex and continued development of collateral branching plan will be for ligation of collateral branches by Dr. Oneida Alar  He will be tentatively scheduled for this procedure on 01/04/2018 however would like to check his calendar when he gets home and will call  back with confirmation Risk, benefits, and alternatives to access surgery were discussed.   The patient is aware the risks include but are not limited to: bleeding, infection, thrombosis, failure to mature, and need for additional procedures.  The patient agrees to proceed forward with the procedure.   Dagoberto Ligas PA-C [facility] [place] Office: [phone]

## 2017-12-27 ENCOUNTER — Other Ambulatory Visit: Payer: Self-pay | Admitting: *Deleted

## 2017-12-27 NOTE — Progress Notes (Signed)
Instructed to be at [facility] admitting at 5:30 am on 01/17/18 for surgery. NPO past MN. Must have a driver and caregiver for discharge to home. Expect a call and follow the detailed instructions received from the hospital pre-admission department for this surgery, medications. Verbalized understanding to call this office if questions.

## 2017-12-28 ENCOUNTER — Telehealth: Payer: Self-pay | Admitting: *Deleted

## 2017-12-28 NOTE — Telephone Encounter (Signed)
Call to patient with time change for 01/17/18 surgery to arrive at admitting at 6:30 am.

## 2017-12-30 ENCOUNTER — Telehealth: Payer: Self-pay | Admitting: *Deleted

## 2017-12-30 NOTE — Telephone Encounter (Signed)
Call to patient for date change for surgery. Instructed to be at [facility] admitting at 6:30 am on 01/25/18 for procedure. All other pre-op instructions unchanged.

## 2018-01-10 ENCOUNTER — Encounter: Payer: Self-pay | Admitting: Nurse Practitioner

## 2018-01-10 ENCOUNTER — Ambulatory Visit (INDEPENDENT_AMBULATORY_CARE_PROVIDER_SITE_OTHER): Payer: BLUE CROSS/BLUE SHIELD | Admitting: Nurse Practitioner

## 2018-01-10 VITALS — BP 120/58 | HR 82 | Temp 98.9°F | Ht 74.0 in | Wt 280.0 lb

## 2018-01-10 DIAGNOSIS — J019 Acute sinusitis, unspecified: Secondary | ICD-10-CM

## 2018-01-10 MED ORDER — AMOXICILLIN-POT CLAVULANATE 875-125 MG PO TABS: 1.0000 | ORAL_TABLET | Freq: Two times a day (BID) | ORAL | 0 refills | Status: DC

## 2018-01-10 NOTE — Patient Instructions (Signed)
Augmentin as prescribed  Please follow up for fevers over 101, if your symptoms get worse, or if your symptoms dont get better with the antibiotic.  Please follow up with your dentist if the bump on your tongue does not go away   Sinusitis, Adult Sinusitis is soreness and inflammation of your sinuses. Sinuses are hollow spaces in the bones around your face. They are located:  Around your eyes.  In the middle of your forehead.  Behind your nose.  In your cheekbones.  Your sinuses and nasal passages are lined with a stringy fluid (mucus). Mucus normally drains out of your sinuses. When your nasal tissues get inflamed or swollen, the mucus can get trapped or blocked so air cannot flow through your sinuses. This lets bacteria, viruses, and funguses grow, and that leads to infection. Follow these instructions at home: Medicines  Take, use, or apply over-the-counter and prescription medicines only as told by your doctor. These may include nasal sprays.  If you were prescribed an antibiotic medicine, take it as told by your doctor. Do not stop taking the antibiotic even if you start to feel better. Hydrate and Humidify  Drink enough water to keep your pee (urine) clear or pale yellow.  Use a cool mist humidifier to keep the humidity level in your home above 50%.  Breathe in steam for 10-15 minutes, 3-4 times a day or as told by your doctor. You can do this in the bathroom while a hot shower is running.  Try not to spend time in cool or dry air. Rest  Rest as much as possible.  Sleep with your head raised (elevated).  Make sure to get enough sleep each night. General instructions  Put a warm, moist washcloth on your face 3-4 times a day or as told by your doctor. This will help with discomfort.  Wash your hands often with soap and water. If there is no soap and water, use hand sanitizer.  Do not smoke. Avoid being around people who are smoking (secondhand smoke).  Keep all  follow-up visits as told by your doctor. This is important. Contact a doctor if:  You have a fever.  Your symptoms get worse.  Your symptoms do not get better within 10 days. Get help right away if:  You have a very bad headache.  You cannot stop throwing up (vomiting).  You have pain or swelling around your face or eyes.  You have trouble seeing.  You feel confused.  Your neck is stiff.  You have trouble breathing. This information is not intended to replace advice given to you by your health care provider. Make sure you discuss any questions you have with your health care provider. Document Released: 08/26/2007 Document Revised: 11/03/2015 Document Reviewed: 01/02/2015 Elsevier Interactive Patient Education  [employer].

## 2018-01-10 NOTE — Progress Notes (Signed)
Craig Flynn is a 57 y.o. male with the following history as recorded in EpicCare:  Patient Active Problem List   Diagnosis Date Noted  . 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 Thyroid nodule 01/22/2017  . Ptosis 01/09/2016  . Chronic systolic heart failure ([place]) 08/01/2015  . GERD (gastroesophageal reflux disease) 07/29/2015  . Osteoarthritis 05/03/2015  . Hyperlipidemia 02/26/2015  . Diastolic dysfunction 48/25/0037  . Tobacco abuse 02/26/2015  . OSA (obstructive sleep apnea) 12/27/2014  . Routine general medical examination at a health care facility 11/12/2014  . Anemia in chronic kidney disease 11/12/2014  . Left knee pain 09/22/2014  . Systolic CHF ([place]) 04/88/8916  . Paresthesia of left leg 09/21/2014  . Acute systolic CHF (congestive heart failure) ([place]) 11/17/2013  . CKD (chronic kidney disease) stage 4, GFR 15-29 ml/min (HCC) 11/17/2013  . Morbid obesity ([place]) 11/17/2013  . Diabetes mellitus ([place]) 12/12/2011  . Acute on chronic renal failure ([place]) 12/11/2011  . Hypokalemia 12/11/2011  . HTN (hypertension) 12/11/2011  . Gout 12/31/2006  . Essential hypertension 12/31/2006  . HERNIATED LUMBAR DISK WITH RADICULOPATHY 12/30/2006    Current Outpatient Medications  Medication Sig Dispense Refill  . acetaminophen (TYLENOL) 500 MG tablet Take 1,000 mg by mouth every 6 (six) hours as needed for mild pain or headache.     . allopurinol (ZYLOPRIM) 300 MG tablet Take 1 tablet by mouth daily 90 tablet 1  . aspirin EC 81 MG tablet Take 81 mg by mouth daily.    . calcitRIOL (ROCALTROL) 0.25 MCG capsule Take 0.25 mcg by mouth every Monday, Wednesday, and Friday.    . carvedilol (COREG) 25 MG tablet TAKE 1 TABLET BY MOUTH TWICE DAILY 180 tablet 2  . fenofibrate 54 MG tablet TAKE 1 TABLET BY MOUTH ONCE DAILY 90 tablet 2  . fluticasone (FLONASE) 50 MCG/ACT nasal spray Place 2 sprays into both nostrils daily. 16 g 1  . furosemide (LASIX) 80 MG tablet Take 1 tablet (80 mg total)  by mouth 2 (two) times daily. (Patient taking differently: Take 160 mg by mouth 2 (two) times daily. ) 60 tablet 1  . gabapentin (NEURONTIN) 100 MG capsule Take 1 capsule (100 mg total) by mouth at bedtime. 30 capsule 0  . HYDROcodone-acetaminophen (NORCO/VICODIN) 5-325 MG tablet Take 1-2 tablets by mouth every 6 (six) hours as needed for moderate pain. 20 tablet 0  . isosorbide mononitrate (IMDUR) 30 MG 24 hr tablet TAKE 2 TABLETS(60 MG) BY MOUTH DAILY 60 tablet 5  . levothyroxine (SYNTHROID) 200 MCG tablet Take 1 tablet (200 mcg total) by mouth daily. 30 tablet 11  . nitroGLYCERIN (NITROSTAT) 0.4 MG SL tablet Place 1 tablet (0.4 mg total) under the tongue every 5 (five) minutes as needed for chest pain. 25 tablet 3  . pantoprazole (PROTONIX) 40 MG tablet TAKE 1 TABLET(40 MG) BY MOUTH DAILY 30 tablet 5  . potassium chloride SA (K-DUR,KLOR-CON) 20 MEQ tablet Take 20 mEq by mouth 2 (two) times daily.    Marland Kitchen amoxicillin-clavulanate (AUGMENTIN) 875-125 MG tablet Take 1 tablet by mouth 2 (two) times daily. 14 tablet 0   No current facility-administered medications for this visit.     Allergies: Patient has no known allergies.  Past Medical History:  Diagnosis Date  . Anemia 06/2015  . Arthritis    knee  . Cancer [facility])    thyroid cancer  . Cardiomyopathy ([place])    a. 10/2013: EF reduced to 35-40% b. 09/2014: EF improved to 55-60%, Grade 1  DD noted.  . Chest pain 06/2015  . CHF (congestive heart failure) ([place])   . CKD (chronic kidney disease), stage IV [facility])    sees  Dr Lorrene Reid  . Dyspnea    with exerion   . GERD (gastroesophageal reflux disease)   . Gout   . Hypertension   . Obesity   . Sleep apnea    not wearing c-pap now, needs new slep study per pt. (01/22/2017)  . Tuberculosis 1981   while the pt was in the service.  . Tubular adenoma of colon 10/2015  . Wears glasses     Past Surgical History:  Procedure Laterality Date  . AV FISTULA PLACEMENT Left 03/25/2016   Procedure: Creation of  Left Arm ARTERIOVENOUS (AV) FISTULA;  Surgeon: Elam Dutch, MD;  [facility];  Service: Vascular;  Laterality: Left;  . COLONOSCOPY W/ BIOPSIES AND POLYPECTOMY     "no problem"  . LAPAROSCOPIC CHOLECYSTECTOMY    . THYROIDECTOMY  01/22/2017  . THYROIDECTOMY Left 01/22/2017   Procedure: THYROIDECTOMY;  Surgeon: Melida Quitter, MD;  Location: [place];  Service: ENT;  Laterality: Left;  . THYROIDECTOMY Right 03/26/2017   Procedure: COMPLETION OF THYROIDECTOMY;  Surgeon: Melida Quitter, MD;  Location: [place];  Service: ENT;  Laterality: Right;  . UMBILICAL HERNIA REPAIR    . WISDOM TOOTH EXTRACTION      Family History  Problem Relation Age of Onset  . Stroke Mother   . Pneumonia Father        died of Pneumonia x3  . Kidney disease Maternal Grandmother   . Hypertension Maternal Grandmother   . Healthy Maternal Grandfather   . Healthy Paternal Grandmother   . Healthy Paternal Grandfather   . CAD Neg Hx   . Colon cancer Neg Hx   . Esophageal cancer Neg Hx   . Pancreatic cancer Neg Hx   . Prostate cancer Neg Hx   . Stomach cancer Neg Hx   . Rectal cancer Neg Hx     Social History   Tobacco Use  . Smoking status: Current Every Day Smoker    Packs/day: 0.50    Years: 40.00    Pack years: 20.00    Types: Cigarettes  . Smokeless tobacco: Never Used  Substance Use Topics  . Alcohol use: Yes    Alcohol/week: 0.0 standard drinks    Comment: 01/22/2017 "might have 1 beer/month; if that"     Subjective:   Mr Ercil is here today for evaluation of an acute complaint of cough/cold symptoms, which first began about 2 weeks ago, no better since onset, reports head and sinus pain and pressure, large amounts of clear nasal drainage, congestion, malaise, body aches. He has also noted a painless knot on the back of his tongue about 3-4 days ago. Denies fevers, chills, cough, shortness of breath, abdominal pain, nausea, vomiting. Current smoker Tried at home: tylenol sinus without much  improvement Has had sinus infections in the past, none recently, no recent antibiotics.  ROS- See HPI  Objective:  Vitals:   01/10/18 1427  BP: (!) 120/58  Pulse: 82  Temp: 98.9 F (37.2 C)  TempSrc: Oral  SpO2: 98%  Weight: 280 lb (127 kg)  Height: 6\' 2"  (1.88 m)    General: Well developed, well nourished, in no acute distress  Skin : Warm and dry.  Head: Normocephalic and atraumatic  Eyes: Sclera and conjunctiva clear; pupils round and reactive to light; extraocular movements intact  Ears: External normal;  canals clear; tympanic membranes cloudy, bulging bilaterally Nose: No maxillary or frontal sinus tenderness. Oropharynx: Pink, supple. White papule to right posterior tongue Neck: Supple without thyromegaly, adenopathy  Lungs: Respirations unlabored; clear to auscultation bilaterally without wheeze, rales, rhonchi   CVS exam: normal rate and regular rhythm, S1 and S2 normal.  Extremities: No edema, cyanosis Vessels: Symmetric bilaterally  Neurologic: Alert and oriented; speech intact; face symmetrical; moves all extremities well; CNII-XII intact without focal deficit   Assessment:  1. Acute non-recurrent sinusitis, unspecified location      Plan:    Augmentin course prescribed Home management, red flags and return precautions including when to seek immediate care discussed and printed on AVS Instructed to F/U with dentist for further evaluation of bump on tongue if it persists  No follow-ups on file.  No orders of the defined types were placed in this encounter.   Requested Prescriptions   Signed Prescriptions Disp Refills  . amoxicillin-clavulanate (AUGMENTIN) 875-125 MG tablet 14 tablet 0    Sig: Take 1 tablet by mouth 2 (two) times daily.

## 2018-01-23 NOTE — Progress Notes (Signed)
Cardiology Office Note   Date:  01/24/2018   ID:  Craig Flynn, DOB 10-06-1960, MRN [id number]  PCP:  Lance Sell, NP  Cardiologist:   Minus Breeding, MD   Chief Complaint  Patient presents with  . Chest Pain     History of Present Illness: Craig Flynn is a 57 y.o. male who presents for evaluation of cardiomyopathy and hypertension. The patient has long-standing hypertension. He was seen a couple of years ago with mildly reduced ejection fraction of about 45%. In July 2016 his EF was said to be about 55%.  Last year he was in the hospital with chest pain.  He had a a perfusion defect that was fixed in the inferior wall with an EF of 47%.   There was no active ischemia.    He returns for follow up.  He says he is been doing well.  He is can have surgery to revise his dialysis fistula tomorrow.  However, his renal function has improved slightly and he has not required dialysis yet.  He continues to get some sporadic chest discomfort after eating.  This is unchanged from 2017.  At that time he had a negative perfusion study for any active ischemia.  He might have a low normal ejection fraction.  However, he otherwise feels well.  He is not having any new shortness of breath, PND or orthopnea.  Is not having any new palpitations, presyncope or syncope.  He does quite a bit of walking.  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  He is maintained significant weight loss though he still wants to work on more.   Past Medical History:  Diagnosis Date  . Anemia 06/2015  . Arthritis    knee  . Cancer [facility])    thyroid cancer  . Cardiomyopathy ([place])    a. 10/2013: EF reduced to 35-40% b. 09/2014: EF improved to 55-60%, Grade 1 DD noted.  . Chest pain 06/2015  . CHF (congestive heart failure) ([place])   . CKD (chronic kidney disease), stage IV [facility])    sees  Dr Lorrene Reid  . Dyspnea    with exerion   . GERD (gastroesophageal reflux disease)   . Gout   .  Hypertension   . Obesity   . Sleep apnea    not wearing c-pap now, needs new slep study per pt. (01/22/2017)  . Tuberculosis 1981   while the pt was in the service.  . Tubular adenoma of colon 10/2015  . Wears glasses     Past Surgical History:  Procedure Laterality Date  . AV FISTULA PLACEMENT Left 03/25/2016   Procedure: Creation of Left Arm ARTERIOVENOUS (AV) FISTULA;  Surgeon: Elam Dutch, MD;  Location: [facility] OR;  Service: Vascular;  Laterality: Left;  . COLONOSCOPY W/ BIOPSIES AND POLYPECTOMY     "no problem"  . LAPAROSCOPIC CHOLECYSTECTOMY    . THYROIDECTOMY  01/22/2017  . THYROIDECTOMY Left 01/22/2017   Procedure: THYROIDECTOMY;  Surgeon: Melida Quitter, MD;  Location: [place];  Service: ENT;  Laterality: Left;  . THYROIDECTOMY Right 03/26/2017   Procedure: COMPLETION OF THYROIDECTOMY;  Surgeon: Melida Quitter, MD;  Location: [place];  Service: ENT;  Laterality: Right;  . UMBILICAL HERNIA REPAIR    . WISDOM TOOTH EXTRACTION       Current Outpatient Medications  Medication Sig Dispense Refill  . acetaminophen (TYLENOL) 500 MG tablet Take 1,000 mg by mouth every  6 (six) hours as needed for mild pain or headache.     . allopurinol (ZYLOPRIM) 300 MG tablet Take 1 tablet by mouth daily (Patient taking differently: Take 300 mg by mouth daily. ) 90 tablet 1  . amoxicillin-clavulanate (AUGMENTIN) 875-125 MG tablet Take 1 tablet by mouth 2 (two) times daily. 14 tablet 0  . aspirin EC 81 MG tablet Take 81 mg by mouth daily.    . carvedilol (COREG) 25 MG tablet TAKE 1 TABLET BY MOUTH TWICE DAILY (Patient taking differently: Take 25 mg by mouth daily. ) 180 tablet 2  . fenofibrate 54 MG tablet TAKE 1 TABLET BY MOUTH ONCE DAILY (Patient taking differently: Take 54 mg by mouth daily. ) 90 tablet 2  . fluticasone (FLONASE) 50 MCG/ACT nasal spray Place 2 sprays into both nostrils daily. 16 g 1  . furosemide (LASIX) 80 MG tablet Take 1 tablet (80 mg total) by mouth 2 (two) times daily. (Patient  taking differently: Take 160 mg by mouth 2 (two) times daily. Morning & afternoon) 60 tablet 1  . isosorbide mononitrate (IMDUR) 30 MG 24 hr tablet TAKE 2 TABLETS(60 MG) BY MOUTH DAILY (Patient taking differently: Take 30 mg by mouth 2 (two) times daily. ) 60 tablet 5  . levothyroxine (SYNTHROID) 200 MCG tablet Take 1 tablet (200 mcg total) by mouth daily. 30 tablet 11  . nitroGLYCERIN (NITROSTAT) 0.4 MG SL tablet Place 1 tablet (0.4 mg total) under the tongue every 5 (five) minutes as needed for chest pain. 25 tablet 3  . pantoprazole (PROTONIX) 40 MG tablet TAKE 1 TABLET(40 MG) BY MOUTH DAILY (Patient taking differently: Take 40 mg by mouth daily. ) 30 tablet 5  . potassium chloride SA (K-DUR,KLOR-CON) 20 MEQ tablet Take 20 mEq by mouth 2 (two) times daily. Morning & afternoon     No current facility-administered medications for this visit.     Allergies:   Patient has no known allergies.   ROS:  Please see the history of present illness.   Otherwise, review of systems are positive for none.   All other systems are reviewed and negative.    PHYSICAL EXAM: VS:  BP (!) 118/50 (BP Location: Right Arm, Patient Position: Sitting, Cuff Size: Large)   Pulse 78   Ht 6\' 2"  (1.88 m)   Wt 280 lb (127 kg)   BMI 35.95 kg/m  , BMI Body mass index is 35.95 kg/m.  GENERAL:  Well appearing NECK:  No jugular venous distention, waveform within normal limits, carotid upstroke brisk and symmetric, no bruits, no thyromegaly LUNGS:  Clear to auscultation bilaterally CHEST:  Unremarkable HEART:  PMI not displaced or sustained,S1 and S2 within normal limits, no S3, no S4, no clicks, no rubs, no murmurs ABD:  Flat, positive bowel sounds normal in frequency in pitch, no bruits, no rebound, no guarding, no midline pulsatile mass, no hepatomegaly, no splenomegaly EXT:  2 plus pulses throughout, no edema, no cyanosis no clubbing, left arm thrill and bruit.     EKG:  EKG is not  ordered today. I did review the  EKG from 12/18/16.  Patient had sinus rhythm, rate 78, axis within normal limits, intervals within normal limits, no acute ST-T wave changes.      Recent Labs: 05/14/2017: Magnesium 1.6 06/11/2017: BUN 54; Creatinine, Ser 4.73; Hemoglobin 11.9; Platelets 222; Potassium 4.0; Sodium 135    Lipid Panel    Component Value Date/Time   CHOL 128 08/11/2017 0844   TRIG 175.0 (H) 08/11/2017  0844   TRIG 98 03/03/2006 0910   HDL 25.10 (L) 08/11/2017 0844   CHOLHDL 5 08/11/2017 0844   VLDL 35.0 08/11/2017 0844   LDLCALC 68 08/11/2017 0844      Wt Readings from Last 3 Encounters:  01/24/18 280 lb (127 kg)  01/10/18 280 lb (127 kg)  12/23/17 283 lb (128.4 kg)      Other studies Reviewed: Additional studies/ records that were reviewed today include: Labs Review of the above records demonstrates:     ASSESSMENT AND PLAN:   CARDIOMYOPATHY:   He has low normal ejection fraction.  At this point I do not think further imaging is indicated.  No change in therapy.   HTN:   Blood pressure is well controlled.  He will continue the meds as listed.   OBESITY:   He used to weigh 340+ pounds and has maintained his weight at 280 and we talked about strategies to include less pasta to help him to lose more weight.  CKD:    This is followed by Dr. Lorrene Reid  CHEST PAIN:    This is atypical and not changed in any pattern.  He had a low risk study 2 years ago.  No change in therapy is indicated.   THYROID NODULE:   He had this removed in reports that it was benign.   PREOP: The patient has patient surgical procedure tomorrow and would be acceptable risk for the planned procedure without further testing or change in therapy.  Current medicines are reviewed at length with the patient today.  The patient does not have concerns regarding medicines.  The following changes have Labs/ tests ordered today include:  None  Orders Placed This Encounter  Procedures  . EKG 12-Lead     Disposition:   FU with  me in 12 months.    Signed, Minus Breeding, MD  01/24/2018 11:16 AM    [place]

## 2018-01-24 ENCOUNTER — Encounter: Payer: Self-pay | Admitting: Cardiology

## 2018-01-24 ENCOUNTER — Ambulatory Visit: Payer: Medicare HMO | Admitting: Cardiology

## 2018-01-24 ENCOUNTER — Other Ambulatory Visit: Payer: Self-pay

## 2018-01-24 ENCOUNTER — Encounter (HOSPITAL_COMMUNITY): Payer: Self-pay | Admitting: *Deleted

## 2018-01-24 VITALS — BP 118/50 | HR 78 | Ht 74.0 in | Wt 280.0 lb

## 2018-01-24 DIAGNOSIS — E669 Obesity, unspecified: Secondary | ICD-10-CM | POA: Diagnosis not present

## 2018-01-24 DIAGNOSIS — I10 Essential (primary) hypertension: Secondary | ICD-10-CM | POA: Diagnosis not present

## 2018-01-24 DIAGNOSIS — N189 Chronic kidney disease, unspecified: Secondary | ICD-10-CM | POA: Diagnosis not present

## 2018-01-24 DIAGNOSIS — E876 Hypokalemia: Secondary | ICD-10-CM | POA: Diagnosis not present

## 2018-01-24 DIAGNOSIS — N185 Chronic kidney disease, stage 5: Secondary | ICD-10-CM | POA: Diagnosis not present

## 2018-01-24 DIAGNOSIS — I120 Hypertensive chronic kidney disease with stage 5 chronic kidney disease or end stage renal disease: Secondary | ICD-10-CM | POA: Diagnosis not present

## 2018-01-24 DIAGNOSIS — I5032 Chronic diastolic (congestive) heart failure: Secondary | ICD-10-CM | POA: Diagnosis not present

## 2018-01-24 DIAGNOSIS — C73 Malignant neoplasm of thyroid gland: Secondary | ICD-10-CM | POA: Diagnosis not present

## 2018-01-24 DIAGNOSIS — D631 Anemia in chronic kidney disease: Secondary | ICD-10-CM | POA: Diagnosis not present

## 2018-01-24 DIAGNOSIS — N2581 Secondary hyperparathyroidism of renal origin: Secondary | ICD-10-CM | POA: Diagnosis not present

## 2018-01-24 DIAGNOSIS — M109 Gout, unspecified: Secondary | ICD-10-CM | POA: Diagnosis not present

## 2018-01-24 DIAGNOSIS — I770 Arteriovenous fistula, acquired: Secondary | ICD-10-CM | POA: Diagnosis not present

## 2018-01-24 MED ORDER — DEXTROSE 5 % IV SOLN
3.0000 g | INTRAVENOUS | Status: AC
  Administered 2018-01-25: 3 g via INTRAVENOUS
  Filled 2018-01-24: qty 3

## 2018-01-24 NOTE — Patient Instructions (Signed)
Medication Instructions:  Continue current medications  If you need a refill on your cardiac medications before your next appointment, please call your pharmacy.  Labwork: None Ordered   If you have labs (blood work) drawn today and your tests are completely normal, you will receive your results only by: Marland Kitchen MyChart Message (if you have MyChart) OR . A paper copy in the mail If you have any lab test that is abnormal or we need to change your treatment, we will call you to review the results.  Testing/Procedures: None Ordered  Follow-Up: You will need a follow up appointment in 1 Year.  Please call our office 2 months in [phone]) to schedule the (1 Year) appointment.  You may see  DR Percival Spanish or one of the following Advanced Practice Providers on your designated Care Team:   . Jory Sims, DNP, ANP . Rhonda Barrett, PA-C .  Marland Kitchen Kerin Ransom, PA-C . Daleen Snook Kroeger, PA-C . Sande Rives, PA-C .  Marland Kitchen Almyra Deforest, PA-C . Fabian Sharp, PA-C  At [facility], you and your health needs are our priority.  As part of our continuing mission to provide you with exceptional heart care, we have created designated Provider Care Teams.  These Care Teams include your primary Cardiologist (physician) and Advanced Practice Providers (APPs -  Physician Assistants and Nurse Practitioners) who all work together to provide you with the care you need, when you need it.   Thank you for choosing [facility] at [facility]!!

## 2018-01-24 NOTE — Progress Notes (Signed)
Pt denies any acute cardiopulmonary issues. Pt under the care of Dr. Percival Spanish, Cardiology ( see note). Pt made aware to stop taking vitamins, fish oil, and herbal medications. Do not take any NSAIDs ie: Ibuprofen, Advil, Naproxen (ALeve), Motrin, BC and Goody Powder. Pt verbalized understanding of all pre-op instructions.

## 2018-01-25 ENCOUNTER — Ambulatory Visit (HOSPITAL_COMMUNITY)
Admission: RE | Admit: 2018-01-25 | Discharge: 2018-01-25 | Disposition: A | Discharge status: Home / Self Care | Payer: BLUE CROSS/BLUE SHIELD | Source: Ambulatory Visit | Attending: Vascular Surgery | Admitting: Vascular Surgery

## 2018-01-25 ENCOUNTER — Ambulatory Visit (HOSPITAL_COMMUNITY): Payer: BLUE CROSS/BLUE SHIELD | Admitting: Certified Registered"

## 2018-01-25 ENCOUNTER — Encounter (HOSPITAL_COMMUNITY): Payer: Self-pay | Admitting: Certified Registered"

## 2018-01-25 ENCOUNTER — Encounter (HOSPITAL_COMMUNITY)
Admission: RE | Disposition: A | Discharge status: Home / Self Care | Payer: Self-pay | Source: Ambulatory Visit | Attending: Vascular Surgery

## 2018-01-25 DIAGNOSIS — K219 Gastro-esophageal reflux disease without esophagitis: Secondary | ICD-10-CM | POA: Diagnosis not present

## 2018-01-25 DIAGNOSIS — Z7982 Long term (current) use of aspirin: Secondary | ICD-10-CM | POA: Insufficient documentation

## 2018-01-25 DIAGNOSIS — Z79890 Hormone replacement therapy (postmenopausal): Secondary | ICD-10-CM | POA: Insufficient documentation

## 2018-01-25 DIAGNOSIS — E039 Hypothyroidism, unspecified: Secondary | ICD-10-CM | POA: Insufficient documentation

## 2018-01-25 DIAGNOSIS — G4730 Sleep apnea, unspecified: Secondary | ICD-10-CM | POA: Diagnosis not present

## 2018-01-25 DIAGNOSIS — F17200 Nicotine dependence, unspecified, uncomplicated: Secondary | ICD-10-CM | POA: Diagnosis not present

## 2018-01-25 DIAGNOSIS — I10 Essential (primary) hypertension: Secondary | ICD-10-CM | POA: Insufficient documentation

## 2018-01-25 DIAGNOSIS — N185 Chronic kidney disease, stage 5: Secondary | ICD-10-CM | POA: Diagnosis not present

## 2018-01-25 DIAGNOSIS — Z7951 Long term (current) use of inhaled steroids: Secondary | ICD-10-CM | POA: Insufficient documentation

## 2018-01-25 DIAGNOSIS — M109 Gout, unspecified: Secondary | ICD-10-CM | POA: Insufficient documentation

## 2018-01-25 DIAGNOSIS — T82590A Other mechanical complication of surgically created arteriovenous fistula, initial encounter: Secondary | ICD-10-CM | POA: Diagnosis not present

## 2018-01-25 DIAGNOSIS — Y832 Surgical operation with anastomosis, bypass or graft as the cause of abnormal reaction of the patient, or of later complication, without mention of misadventure at the time of the procedure: Secondary | ICD-10-CM | POA: Insufficient documentation

## 2018-01-25 DIAGNOSIS — Z79899 Other long term (current) drug therapy: Secondary | ICD-10-CM | POA: Insufficient documentation

## 2018-01-25 DIAGNOSIS — T82898A Other specified complication of vascular prosthetic devices, implants and grafts, initial encounter: Secondary | ICD-10-CM | POA: Diagnosis not present

## 2018-01-25 DIAGNOSIS — N184 Chronic kidney disease, stage 4 (severe): Secondary | ICD-10-CM | POA: Diagnosis not present

## 2018-01-25 DIAGNOSIS — I130 Hypertensive heart and chronic kidney disease with heart failure and stage 1 through stage 4 chronic kidney disease, or unspecified chronic kidney disease: Secondary | ICD-10-CM | POA: Diagnosis not present

## 2018-01-25 DIAGNOSIS — I5021 Acute systolic (congestive) heart failure: Secondary | ICD-10-CM | POA: Diagnosis not present

## 2018-01-25 HISTORY — PX: LIGATION OF COMPETING BRANCHES OF ARTERIOVENOUS FISTULA: SHX5949

## 2018-01-25 SURGERY — LIGATION OF COMPETING BRANCHES OF ARTERIOVENOUS FISTULA
Anesthesia: Monitor Anesthesia Care | Site: Arm Upper | Laterality: Left

## 2018-01-25 MED ORDER — EPHEDRINE SULFATE-NACL 50-0.9 MG/10ML-% IV SOSY
PREFILLED_SYRINGE | INTRAVENOUS | Status: DC | PRN
  Administered 2018-01-25 (×4): 10 mg via INTRAVENOUS

## 2018-01-25 MED ORDER — OXYCODONE HCL 5 MG PO TABS: 5.0000 mg | ORAL_TABLET | Freq: Four times a day (QID) | ORAL | 0 refills | Status: DC | PRN

## 2018-01-25 MED ORDER — LIDOCAINE HCL (PF) 1 % IJ SOLN
INTRAMUSCULAR | Status: DC | PRN
  Administered 2018-01-25: 19 mL

## 2018-01-25 MED ORDER — CHLORHEXIDINE GLUCONATE 4 % EX LIQD: 60.0000 mL | Freq: Once | CUTANEOUS | Status: DC

## 2018-01-25 MED ORDER — FENTANYL CITRATE (PF) 100 MCG/2ML IJ SOLN: 25.0000 ug | INTRAMUSCULAR | Status: DC | PRN

## 2018-01-25 MED ORDER — PROPOFOL 10 MG/ML IV BOLUS
INTRAVENOUS | Status: DC | PRN
  Administered 2018-01-25: 20 mg via INTRAVENOUS
  Administered 2018-01-25: 10 mg via INTRAVENOUS
  Administered 2018-01-25: 70 mg via INTRAVENOUS

## 2018-01-25 MED ORDER — SODIUM CHLORIDE 0.9 % IV SOLN
INTRAVENOUS | Status: AC
  Filled 2018-01-25: qty 1.2

## 2018-01-25 MED ORDER — SODIUM CHLORIDE 0.9 % IV SOLN
INTRAVENOUS | Status: DC
  Administered 2018-01-25: 35 mL/h via INTRAVENOUS
  Administered 2018-01-25: 12:00:00 via INTRAVENOUS

## 2018-01-25 MED ORDER — FENTANYL CITRATE (PF) 250 MCG/5ML IJ SOLN
INTRAMUSCULAR | Status: AC
  Filled 2018-01-25: qty 5

## 2018-01-25 MED ORDER — LIDOCAINE HCL (PF) 1 % IJ SOLN
INTRAMUSCULAR | Status: AC
  Filled 2018-01-25: qty 30

## 2018-01-25 MED ORDER — LIDOCAINE 2% (20 MG/ML) 5 ML SYRINGE
INTRAMUSCULAR | Status: DC | PRN
  Administered 2018-01-25: 40 mg via INTRAVENOUS
  Administered 2018-01-25: 60 mg via INTRAVENOUS

## 2018-01-25 MED ORDER — ONDANSETRON HCL 4 MG/2ML IJ SOLN: 4.0000 mg | Freq: Once | INTRAMUSCULAR | Status: DC | PRN

## 2018-01-25 MED ORDER — SUCCINYLCHOLINE CHLORIDE 20 MG/ML IJ SOLN
INTRAMUSCULAR | Status: DC | PRN
  Administered 2018-01-25: 120 mg via INTRAVENOUS

## 2018-01-25 MED ORDER — HEPARIN SODIUM (PORCINE) 1000 UNIT/ML IJ SOLN
INTRAMUSCULAR | Status: DC | PRN
  Administered 2018-01-25: 5000 [IU] via INTRAVENOUS

## 2018-01-25 MED ORDER — HEPARIN SODIUM (PORCINE) 1000 UNIT/ML IJ SOLN
INTRAMUSCULAR | Status: AC
  Filled 2018-01-25: qty 1

## 2018-01-25 MED ORDER — SODIUM CHLORIDE 0.9 % IV SOLN
INTRAVENOUS | Status: DC | PRN
  Administered 2018-01-25: 25 ug/min via INTRAVENOUS

## 2018-01-25 MED ORDER — 0.9 % SODIUM CHLORIDE (POUR BTL) OPTIME
TOPICAL | Status: DC | PRN
  Administered 2018-01-25: 1000 mL

## 2018-01-25 MED ORDER — MIDAZOLAM HCL 2 MG/2ML IJ SOLN
INTRAMUSCULAR | Status: AC
  Filled 2018-01-25: qty 2

## 2018-01-25 MED ORDER — GABAPENTIN 100 MG PO CAPS
ORAL_CAPSULE | ORAL | Status: AC
  Filled 2018-01-25: qty 1

## 2018-01-25 MED ORDER — MIDAZOLAM HCL 5 MG/5ML IJ SOLN
INTRAMUSCULAR | Status: DC | PRN
  Administered 2018-01-25: 2 mg via INTRAVENOUS

## 2018-01-25 MED ORDER — EPHEDRINE 5 MG/ML INJ
INTRAVENOUS | Status: AC
  Filled 2018-01-25: qty 10

## 2018-01-25 MED ORDER — FENTANYL CITRATE (PF) 100 MCG/2ML IJ SOLN
INTRAMUSCULAR | Status: DC | PRN
  Administered 2018-01-25 (×4): 50 ug via INTRAVENOUS

## 2018-01-25 MED ORDER — PROPOFOL 500 MG/50ML IV EMUL
INTRAVENOUS | Status: DC | PRN
  Administered 2018-01-25: 75 ug/kg/min via INTRAVENOUS

## 2018-01-25 SURGICAL SUPPLY — 46 items
ADH SKN CLS APL DERMABOND .7 (GAUZE/BANDAGES/DRESSINGS) ×3
AGENT HMST SPONGE THK3/8 (HEMOSTASIS)
ARMBAND PINK RESTRICT EXTREMIT (MISCELLANEOUS) ×2 IMPLANT
BAG DECANTER FOR FLEXI CONT (MISCELLANEOUS) ×1 IMPLANT
BLADE 10 SAFETY STRL DISP (BLADE) ×1 IMPLANT
CANISTER SUCT 3000ML PPV (MISCELLANEOUS) ×2 IMPLANT
CANNULA VESSEL 3MM 2 BLNT TIP (CANNULA) ×1 IMPLANT
CLIP VESOCCLUDE MED 6/CT (CLIP) ×1 IMPLANT
CLIP VESOCCLUDE SM WIDE 6/CT (CLIP) ×1 IMPLANT
COVER PROBE W GEL 5X96 (DRAPES) IMPLANT
COVER WAND RF STERILE (DRAPES) ×2 IMPLANT
DERMABOND ADVANCED (GAUZE/BANDAGES/DRESSINGS) ×3
DERMABOND ADVANCED .7 DNX12 (GAUZE/BANDAGES/DRESSINGS) ×1 IMPLANT
ELECT REM PT RETURN 9FT ADLT (ELECTROSURGICAL) ×2
ELECTRODE REM PT RTRN 9FT ADLT (ELECTROSURGICAL) ×1 IMPLANT
GAUZE SPONGE 4X4 16PLY XRAY LF (GAUZE/BANDAGES/DRESSINGS) ×1 IMPLANT
GLOVE BIO SURGEON STRL SZ 6 (GLOVE) ×1 IMPLANT
GLOVE BIO SURGEON STRL SZ 6.5 (GLOVE) ×3 IMPLANT
GLOVE BIO SURGEON STRL SZ7.5 (GLOVE) ×3 IMPLANT
GLOVE BIOGEL M 6.5 STRL (GLOVE) ×1 IMPLANT
GLOVE BIOGEL PI IND STRL 6.5 (GLOVE) IMPLANT
GLOVE BIOGEL PI INDICATOR 6.5 (GLOVE) ×1
GLOVE SURG SS PI 6.0 STRL IVOR (GLOVE) ×3 IMPLANT
GOWN STRL REUS W/ TWL LRG LVL3 (GOWN DISPOSABLE) ×3 IMPLANT
GOWN STRL REUS W/TWL LRG LVL3 (GOWN DISPOSABLE) ×6
HEMOSTAT SPONGE AVITENE ULTRA (HEMOSTASIS) IMPLANT
KIT BASIN OR (CUSTOM PROCEDURE TRAY) ×2 IMPLANT
KIT TURNOVER KIT B (KITS) ×2 IMPLANT
LOOP VESSEL MAXI BLUE (MISCELLANEOUS) ×1 IMPLANT
LOOP VESSEL MINI RED (MISCELLANEOUS) ×1 IMPLANT
NS IRRIG 1000ML POUR BTL (IV SOLUTION) ×2 IMPLANT
PACK CV ACCESS (CUSTOM PROCEDURE TRAY) ×2 IMPLANT
PAD ARMBOARD 7.5X6 YLW CONV (MISCELLANEOUS) ×4 IMPLANT
SUT PROLENE 6 0 CC (SUTURE) ×1 IMPLANT
SUT PROLENE 7 0 BV 1 (SUTURE) ×1 IMPLANT
SUT PROLENE 7 0 BV1 MDA (SUTURE) ×3 IMPLANT
SUT SILK 0 (SUTURE) IMPLANT
SUT VIC AB 3-0 SH 27 (SUTURE) ×4
SUT VIC AB 3-0 SH 27X BRD (SUTURE) ×1 IMPLANT
SUT VIC AB 4-0 PS2 18 (SUTURE) ×1 IMPLANT
SUT VICRYL 4-0 PS2 18IN ABS (SUTURE) ×2 IMPLANT
SYR 20ML ECCENTRIC (SYRINGE) ×1 IMPLANT
SYR 30ML LL (SYRINGE) ×1 IMPLANT
TOWEL GREEN STERILE (TOWEL DISPOSABLE) ×2 IMPLANT
UNDERPAD 30X30 (UNDERPADS AND DIAPERS) ×2 IMPLANT
WATER STERILE IRR 1000ML POUR (IV SOLUTION) ×2 IMPLANT

## 2018-01-25 NOTE — Discharge Instructions (Signed)
° °  [facility]  Discharge Instructions  AV Fistula or Graft Surgery for Dialysis Access  Please refer to the following instructions for your post-procedure care. Your surgeon or physician assistant will discuss any changes with you.  Activity  You may drive the day following your surgery, if you are comfortable and no longer taking prescription pain medication. Resume full activity as the soreness in your incision resolves.  Bathing/Showering  You may shower after you go home. Keep your incision dry for 48 hours. Do not soak in a bathtub, hot tub, or swim until the incision heals completely. You may not shower if you have a hemodialysis catheter.  Incision Care  Clean your incision with mild soap and water after 48 hours. Pat the area dry with a clean towel. You do not need a bandage unless otherwise instructed. Do not apply any ointments or creams to your incision. You may have skin glue on your incision. Do not peel it off. It will come off on its own in about one week. Your arm may swell a bit after surgery. To reduce swelling use pillows to elevate your arm so it is above your heart. Your doctor will tell you if you need to lightly wrap your arm with an ACE bandage.  Diet  Resume your normal diet. There are not special food restrictions following this procedure. In order to heal from your surgery, it is CRITICAL to get adequate nutrition. Your body requires vitamins, minerals, and protein. Vegetables are the best source of vitamins and minerals. Vegetables also provide the perfect balance of protein. Processed food has little nutritional value, so try to avoid this.  Medications  Resume taking all of your medications. If your incision is causing pain, you may take over-the counter pain relievers such as acetaminophen (Tylenol). If you were prescribed a stronger pain medication, please be aware these medications can cause nausea and constipation. Prevent  nausea by taking the medication with a snack or meal. Avoid constipation by drinking plenty of fluids and eating foods with high amount of fiber, such as fruits, vegetables, and grains.  Do not take Tylenol if you are taking prescription pain medications.  Follow up Your surgeon may want to see you in the office following your access surgery. If so, this will be arranged at the time of your surgery.  Please call us immediately for any of the following conditions:  Increased pain, redness, drainage (pus) from your incision site Fever of 101 degrees or higher Severe or worsening pain at your incision site Hand pain or numbness.  Reduce your risk of vascular disease:  Stop smoking. If you would like help, call [facility] at 1-800-QUIT-NOW 5028714284) or [place] at [place] your cholesterol Maintain a desired weight Control your diabetes Keep your blood pressure down  Dialysis  It will take several weeks to several months for your new dialysis access to be ready for use. Your surgeon will determine when it is okay to use it. Your nephrologist will continue to direct your dialysis. You can continue to use your Permcath until your new access is ready for use.   01/25/2018 Craig Flynn [id number] 1960/05/12  Surgeon(s): Fields, Jessy Oto, MD  Procedure(s): LIGATION OF COMPETING BRANCHES And Revision of ARTERIOVENOUS FISTULA LEFT ARM.  x Do not stick fistula for 8 weeks    If you have any questions, please call the office at [phone].

## 2018-01-25 NOTE — Anesthesia Preprocedure Evaluation (Addendum)
Anesthesia Evaluation  Patient identified by MRN, date of birth, ID band Patient awake    Reviewed: Allergy & Precautions, NPO status , Patient's Chart, lab work & pertinent test results, reviewed documented beta blocker date and time   Airway Mallampati: III  TM Distance: >3 FB Neck ROM: Full    Dental no notable dental hx.    Pulmonary sleep apnea , Current Smoker,    Pulmonary exam normal breath sounds clear to auscultation       Cardiovascular hypertension, Pt. on home beta blockers +CHF  Normal cardiovascular exam Rhythm:Regular Rate:Normal  ECG: NSR, rate 77  Sees Dr. Percival Spanish, Cardiology    Neuro/Psych negative neurological ROS  negative psych ROS   GI/Hepatic Neg liver ROS, GERD  Medicated and Controlled,  Endo/Other  Hypothyroidism   Renal/GU ESRFRenal disease     Musculoskeletal Gout   Abdominal (+) + obese,   Peds  Hematology negative hematology ROS (+)   Anesthesia Other Findings COMPLICATION WITH ARTERIOVENOUS FISTULA LEFT ARM  Reproductive/Obstetrics                            Anesthesia Physical Anesthesia Plan  ASA: III  Anesthesia Plan: MAC   Post-op Pain Management:    Induction: Intravenous  PONV Risk Score and Plan: 1 and Propofol infusion and Treatment may vary due to age or medical condition  Airway Management Planned: Simple Face Mask  Additional Equipment:   Intra-op Plan:   Post-operative Plan:   Informed Consent: I have reviewed the patients History and Physical, chart, labs and discussed the procedure including the risks, benefits and alternatives for the proposed anesthesia with the patient or authorized representative who has indicated his/her understanding and acceptance.   Dental advisory given  Plan Discussed with: CRNA  Anesthesia Plan Comments:        Anesthesia Quick Evaluation

## 2018-01-25 NOTE — H&P (Signed)
Craig Flynn is a 57 y.o. (1960/11/30) male who presents for re-evaluation of permanent access.  He is status post left brachiocephalic fistula creation by Dr. Oneida Alar on 03/2016.  As of today patient is still not requiring hemodialysis.  He returns to office to go over a fistula duplex due to what is believed to be a non-maturing fistula with collateral branch development.  He denies any signs or symptoms of a steal syndrome.  He is on the transplant list.        Current Outpatient Medications  Medication Sig Dispense Refill  . acetaminophen (TYLENOL) 500 MG tablet Take 1,000 mg by mouth every 6 (six) hours as needed for mild pain or headache.     . allopurinol (ZYLOPRIM) 300 MG tablet Take 1 tablet by mouth daily 90 tablet 1  . aspirin EC 81 MG tablet Take 81 mg by mouth daily.    . calcitRIOL (ROCALTROL) 0.25 MCG capsule Take 0.25 mcg by mouth every Monday, Wednesday, and Friday.    . carvedilol (COREG) 25 MG tablet TAKE 1 TABLET BY MOUTH TWICE DAILY 180 tablet 2  . fenofibrate 54 MG tablet TAKE 1 TABLET BY MOUTH ONCE DAILY 90 tablet 2  . fluticasone (FLONASE) 50 MCG/ACT nasal spray Place 2 sprays into both nostrils daily. 16 g 1  . furosemide (LASIX) 80 MG tablet Take 1 tablet (80 mg total) by mouth 2 (two) times daily. (Patient taking differently: Take 160 mg by mouth 2 (two) times daily. ) 60 tablet 1  . isosorbide mononitrate (IMDUR) 30 MG 24 hr tablet TAKE 2 TABLETS(60 MG) BY MOUTH DAILY 60 tablet 5  . levothyroxine (SYNTHROID) 200 MCG tablet Take 1 tablet (200 mcg total) by mouth daily. 30 tablet 11  . nitroGLYCERIN (NITROSTAT) 0.4 MG SL tablet Place 1 tablet (0.4 mg total) under the tongue every 5 (five) minutes as needed for chest pain. 25 tablet 3  . pantoprazole (PROTONIX) 40 MG tablet TAKE 1 TABLET(40 MG) BY MOUTH DAILY 30 tablet 5  . potassium chloride SA (K-DUR,KLOR-CON) 20 MEQ tablet Take 20 mEq by mouth 2 (two) times daily.    Marland Kitchen amoxicillin-clavulanate (AUGMENTIN) 875-125  MG tablet Take 1 tablet by mouth 2 (two) times daily. (Patient not taking: Reported on 12/23/2017) 14 tablet 0  . gabapentin (NEURONTIN) 100 MG capsule Take 1 capsule (100 mg total) by mouth at bedtime. (Patient not taking: Reported on 12/23/2017) 30 capsule 0  . HYDROcodone-acetaminophen (NORCO/VICODIN) 5-325 MG tablet Take 1-2 tablets by mouth every 6 (six) hours as needed for moderate pain. (Patient not taking: Reported on 12/23/2017) 20 tablet 0   No current facility-administered medications for this visit.     Physical Examination   Vitals:   01/25/18 0728  BP: 127/60  Pulse: 75  Resp: 18  Temp: 97.8 F (36.6 C)  TempSrc: Oral  SpO2: 100%     General Alert, O x 3, WD  Pulmonary Sym exp, good B air movt,   Cardiac RRR, Nl S1, S2  Vascular Vessel Right Left  Radial Palpable Palpable  Brachial Palpable Palpable  Ulnar Not palpable Not palpable    Musculo- skeletal  palpable thrill near [facility] fossa of left arm; audible bruit upper arm; palpable large collateral branch development distal upper arm   Neurologic A&O; CN grossly intact     Non-invasive Vascular Imaging   left Arm Access Duplex 12/23/17  Patent fistula with diameter ranging from 0.4 to 0.5 cm with a depth ranging from 0.3 to 0.5  cm  Numerous branches and tortuosity noted    Medical Decision Making   Side branch ligation to assist with maturation of fistula today.  Ruta Hinds, MD [facility] [place] Office: [phone] Pager: [phone]

## 2018-01-25 NOTE — Transfer of Care (Signed)
Immediate Anesthesia Transfer of Care Note  Patient: Craig Flynn  Procedure(s) Performed: LIGATION OF COMPETING BRANCHES And Revision of ARTERIOVENOUS FISTULA LEFT ARM. (Left Arm Upper)  Patient Location: PACU  Anesthesia Type:General  Level of Consciousness: awake and patient cooperative  Airway & Oxygen Therapy: Patient Spontanous Breathing and Patient connected to nasal cannula oxygen  Post-op Assessment: Report given to RN, Post -op Vital signs reviewed and stable and Patient moving all extremities  Post vital signs: Reviewed and stable  Last Vitals:  Vitals Value Taken Time  BP 146/70 01/25/2018 12:15 PM  Temp    Pulse 81 01/25/2018 12:16 PM  Resp 34 01/25/2018 12:16 PM  SpO2 98 % 01/25/2018 12:16 PM  Vitals shown include unvalidated device data.  Last Pain:  Vitals:   01/25/18 0804  TempSrc:   PainSc: 0-No pain      Patients Stated Pain Goal: 2 (31/12/16 2446)  Complications: No apparent anesthesia complications

## 2018-01-25 NOTE — Anesthesia Procedure Notes (Signed)
Date/Time: 01/25/2018 9:20 AM Performed by: Moshe Salisbury, CRNA Pre-anesthesia Checklist: Patient identified, Emergency Drugs available, Suction available, Patient being monitored and Timeout performed Patient Re-evaluated:Patient Re-evaluated prior to induction Oxygen Delivery Method: Nasal cannula Induction Type: IV induction Placement Confirmation: positive ETCO2

## 2018-01-25 NOTE — Anesthesia Procedure Notes (Signed)
Procedure Name: Intubation Date/Time: 01/25/2018 10:49 AM Performed by: Moshe Salisbury, CRNA Pre-anesthesia Checklist: Patient identified, Emergency Drugs available, Suction available and Patient being monitored Patient Re-evaluated:Patient Re-evaluated prior to induction Oxygen Delivery Method: Circle System Utilized Preoxygenation: Pre-oxygenation with 100% oxygen Induction Type: IV induction and Rapid sequence Laryngoscope Size: Mac and 4 Grade View: Grade II Tube type: Oral Tube size: 7.5 mm Number of attempts: 1 Airway Equipment and Method: Stylet and Oral airway Placement Confirmation: ETT inserted through vocal cords under direct vision,  positive ETCO2 and breath sounds checked- equal and bilateral Secured at: 22 cm Tube secured with: Tape Dental Injury: Teeth and Oropharynx as per pre-operative assessment

## 2018-01-25 NOTE — Op Note (Signed)
Procedure: Revision left arm AV fistula with ligation of multiple side branches  Preoperative diagnosis: Poorly maturing AV fistula left arm  Postoperative diagnosis: Same  Anesthesia: General  Operative findings: #1 large cephalic vein bifurcation proximal upper arm #2 diffuse narrowing with no obvious mechanical stenosis distal portion of fistula #3 ligation of multiple side branches #4 revision with resection of thickened portion of vein and and proximal AV fistula  Operative details: After obtaining informed consent, the patient was taken the operating room.  The patient was placed in supine position on operating table.  After adequate sedation patient's entire left upper extremities prepped and draped in usual sterile fashion.  Local anesthesia was infiltrated over a large side branch of proximally 4 cm from the arterial anastomosis.  This was identified with ultrasound.  An incision was made in this location carried down through the subtendinous tissues down level of fistula.  The proximal portion of the AV fistula was about 5 to 6 mm in diameter.  The fistula then divided at this location.  Each branch was about 4 to 5 mm in diameter.  Using ultrasound the native cephalic vein that coursed over normal anatomic position of the biceps eventually tapered down and appeared to occlude.  There was also a very thickened segment at this bifurcation point indicating a thickened valve or intimal hyperplasia.  I mobilized each branch over several centimeters.  During the course of this and injury occurred to the more medial branch and I repaired this with a series of interrupted 7-0 Prolene sutures.  The patient was given 5000 units of intravenous heparin.  The proximal fistula was clamped with a fistula clamp.  Each branch was inspected and I decided that the outflow of the more medial branch was better than the native cephalic vein.  Therefore the branch that was extending over the bicep muscle was ligated  and divided between silk ties.  There was still a very sharp angulation in the vein at this location so I resected the angled thickened portion which was a very thickened hyperplastic valve.  I then reanastomosed the 2 ends of the vein using a running 7-0 Prolene suture.  Just prior to completion anastomosis it was forebled backbled and thoroughly flushed.  Anastomosis was secured clamps released there was still pulsatile flow within the fistula.  To other side branches in the genital area were ligated and divided between silk ties.  At this point I used ultrasound again to examine the vein more distally as it proceeded up the arm.  The vein was patent but had a diffuse change in caliber size.  At this point the patient became very agitated during the course of the case so the general anesthesia was commenced.  I mobilized the vein over an additional 4 to 5 cm.  I could find no obvious mechanical narrowing in the vein but I ligated several side branches.  The vein was about 2-1/2 to 3 mm in diameter at this point.  There was a definite caliber change but I could not find any endpoint in the smaller caliber size.  At this point many side branches have been ligated and I felt that the fistula was as good as it was going to get.  Therefore hemostasis was obtained.  The skin of all 4 incisions was reapproximated using a running 4 0 Vicryl sutures.  The patient tolerated procedure well and there were no complications.  The patient was taken the recovery room in stable condition.  Operative  management: The patient will return for follow-up in 6 weeks.  If the fistula occludes at the head of this or fails to mature or improved by the 6-week appointment consideration may need to be given for a new hemodialysis access.  Ruta Hinds, MD [facility] [place] Office: [phone] Pager: [phone]

## 2018-01-25 NOTE — Anesthesia Postprocedure Evaluation (Signed)
Anesthesia Post Note  Patient: Craig Flynn  Procedure(s) Performed: LIGATION OF COMPETING BRANCHES And Revision of ARTERIOVENOUS FISTULA LEFT ARM. (Left Arm Upper)     Patient location during evaluation: PACU Anesthesia Type: General Level of consciousness: awake and alert Pain management: pain level controlled Vital Signs Assessment: post-procedure vital signs reviewed and stable Respiratory status: spontaneous breathing, nonlabored ventilation, respiratory function stable and patient connected to nasal cannula oxygen Cardiovascular status: blood pressure returned to baseline and stable Postop Assessment: no apparent nausea or vomiting Anesthetic complications: no    Last Vitals:  Vitals:   01/25/18 1245 01/25/18 1313  BP: 140/84 (!) 157/69  Pulse: 79 83  Resp: 20 14  Temp:  36.6 C  SpO2: 100% 99%    Last Pain:  Vitals:   01/25/18 1215  TempSrc:   PainSc: Asleep                 Ryan P Ellender

## 2018-01-26 ENCOUNTER — Encounter (HOSPITAL_COMMUNITY): Payer: Self-pay | Admitting: Vascular Surgery

## 2018-01-26 LAB — POCT I-STAT 4, (NA,K, GLUC, HGB,HCT)
Glucose, Bld: 94 mg/dL (ref 70–99)
HCT: 35 % — ABNORMAL LOW (ref 39.0–52.0)
Hemoglobin: 11.9 g/dL — ABNORMAL LOW (ref 13.0–17.0)
Potassium: 4.4 mmol/L (ref 3.5–5.1)
Sodium: 139 mmol/L (ref 135–145)

## 2018-01-27 ENCOUNTER — Telehealth: Payer: Self-pay | Admitting: Vascular Surgery

## 2018-01-27 NOTE — Telephone Encounter (Signed)
sch appt spk to pt mld ltr 03/10/18 3pm Dialysis Duplex 4pm p/o MD

## 2018-01-27 NOTE — Telephone Encounter (Signed)
-----   Message from Mena Goes, RN sent at 01/25/2018  1:15 PM EST ----- Regarding: 4-6 weeks with duplex and CEF   ----- Message ----- From: Elam Dutch, MD Sent: 01/25/2018  12:13 PM EST To: Vvs Charge Pool  US guided sidebranch ligation Revision end to end  Multiple side branch ligation  Have him follow up with me with [place] 4-6 week  Samantha asst  Ruta Hinds

## 2018-02-01 ENCOUNTER — Other Ambulatory Visit: Payer: Self-pay

## 2018-02-01 DIAGNOSIS — N185 Chronic kidney disease, stage 5: Secondary | ICD-10-CM

## 2018-03-02 ENCOUNTER — Other Ambulatory Visit: Payer: Self-pay | Admitting: Nurse Practitioner

## 2018-03-02 ENCOUNTER — Encounter: Payer: Self-pay | Admitting: Nurse Practitioner

## 2018-03-02 ENCOUNTER — Ambulatory Visit (INDEPENDENT_AMBULATORY_CARE_PROVIDER_SITE_OTHER): Payer: Medicare HMO | Admitting: Nurse Practitioner

## 2018-03-02 DIAGNOSIS — R05 Cough: Secondary | ICD-10-CM

## 2018-03-02 DIAGNOSIS — R059 Cough, unspecified: Secondary | ICD-10-CM

## 2018-03-02 MED ORDER — FLUTICASONE PROPIONATE 50 MCG/ACT NA SUSP: 2.0000 | Freq: Every day | NASAL | 1 refills | Status: DC

## 2018-03-02 NOTE — Patient Instructions (Addendum)
I have sent a prescription for flonase nasal spray to your pharmacy- you may start with 2 sprays in each nostril daily then reduce to 1 spray in each nostril daily when your symptoms improve You may also take an over the counter allergy medication such as claritin or zyrtec for your symptoms.  Please return in about 2 weeks so I can see how your are doing   Food Choices for Gastroesophageal Reflux Disease, Adult When you have gastroesophageal reflux disease (GERD), the foods you eat and your eating habits are very important. Choosing the right foods can help ease your discomfort. What guidelines do I need to follow?  Choose fruits, vegetables, whole grains, and low-fat dairy products.  Choose low-fat meat, fish, and poultry.  Limit fats such as oils, salad dressings, butter, nuts, and avocado.  Keep a food diary. This helps you identify foods that cause symptoms.  Avoid foods that cause symptoms. These may be different for everyone.  Eat small meals often instead of 3 large meals a day.  Eat your meals slowly, in a place where you are relaxed.  Limit fried foods.  Cook foods using methods other than frying.  Avoid drinking alcohol.  Avoid drinking large amounts of liquids with your meals.  Avoid bending over or lying down until 2-3 hours after eating. What foods are not recommended? These are some foods and drinks that may make your symptoms worse: Vegetables Tomatoes. Tomato juice. Tomato and spaghetti sauce. Chili peppers. Onion and garlic. Horseradish. Fruits Oranges, grapefruit, and lemon (fruit and juice). Meats High-fat meats, fish, and poultry. This includes hot dogs, ribs, ham, sausage, salami, and bacon. Dairy Whole milk and chocolate milk. Sour cream. Cream. Butter. Ice cream. Cream cheese. Drinks Coffee and tea. Bubbly (carbonated) drinks or energy drinks. Condiments Hot sauce. Barbecue sauce. Sweets/Desserts Chocolate and cocoa. Donuts. Peppermint and  spearmint. Fats and Oils High-fat foods. This includes [place] fries and potato chips. Other Vinegar. Strong spices. This includes black pepper, white pepper, red pepper, cayenne, curry powder, cloves, ginger, and chili powder. The items listed above may not be a complete list of foods and drinks to avoid. Contact your dietitian for more information. This information is not intended to replace advice given to you by your health care provider. Make sure you discuss any questions you have with your health care provider. Document Released: 09/08/2011 Document Revised: 08/15/2015 Document Reviewed: 01/11/2013 Elsevier Interactive Patient Education  2017 [employer].

## 2018-03-02 NOTE — Progress Notes (Signed)
Craig Flynn is a 57 y.o. male with the following history as recorded in EpicCare:  Patient Active Problem List   Diagnosis Date Noted  . Follicular thyroid cancer ([place]) 03/26/2017  . Thyroid nodule 01/22/2017  . Ptosis 01/09/2016  . Chronic systolic heart failure ([place]) 08/01/2015  . GERD (gastroesophageal reflux disease) 07/29/2015  . Osteoarthritis 05/03/2015  . Hyperlipidemia 02/26/2015  . Diastolic dysfunction 83/66/2947  . Tobacco abuse 02/26/2015  . OSA (obstructive sleep apnea) 12/27/2014  . Routine general medical examination at a health care facility 11/12/2014  . Anemia in chronic kidney disease 11/12/2014  . Left knee pain 09/22/2014  . Systolic CHF ([place]) 65/46/5035  . Paresthesia of left leg 09/21/2014  . Acute systolic CHF (congestive heart failure) ([place]) 11/17/2013  . CKD (chronic kidney disease) stage 4, GFR 15-29 ml/min ([facility]) 11/17/2013  . Morbid obesity ([place]) 11/17/2013  . Diabetes mellitus ([place]) 12/12/2011  . Acute on chronic renal failure ([place]) 12/11/2011  . Hypokalemia 12/11/2011  . HTN (hypertension) 12/11/2011  . Gout 12/31/2006  . Essential hypertension 12/31/2006  . HERNIATED LUMBAR DISK WITH RADICULOPATHY 12/30/2006    Current Outpatient Medications  Medication Sig Dispense Refill  . acetaminophen (TYLENOL) 500 MG tablet Take 1,000 mg by mouth every 6 (six) hours as needed for mild pain or headache.     . allopurinol (ZYLOPRIM) 300 MG tablet Take 1 tablet by mouth daily (Patient taking differently: Take 300 mg by mouth daily. ) 90 tablet 1  . aspirin EC 81 MG tablet Take 81 mg by mouth daily.    . calcitRIOL (ROCALTROL) 0.25 MCG capsule Take 1 capsule by mouth 3 (three) times a week.  5  . carvedilol (COREG) 25 MG tablet TAKE 1 TABLET BY MOUTH TWICE DAILY (Patient taking differently: Take 25 mg by mouth daily. ) 180 tablet 2  . fenofibrate 54 MG tablet TAKE 1 TABLET BY MOUTH ONCE DAILY (Patient taking differently: Take 54 mg by mouth daily. ) 90 tablet  2  . fluticasone (FLONASE) 50 MCG/ACT nasal spray Place 2 sprays into both nostrils daily. 16 g 1  . furosemide (LASIX) 80 MG tablet Take 1 tablet (80 mg total) by mouth 2 (two) times daily. (Patient taking differently: Take 160 mg by mouth 2 (two) times daily. Morning & afternoon) 60 tablet 1  . isosorbide mononitrate (IMDUR) 30 MG 24 hr tablet TAKE 2 TABLETS(60 MG) BY MOUTH DAILY (Patient taking differently: Take 30 mg by mouth 2 (two) times daily. ) 60 tablet 5  . levothyroxine (SYNTHROID) 200 MCG tablet Take 1 tablet (200 mcg total) by mouth daily. 30 tablet 11  . nitroGLYCERIN (NITROSTAT) 0.4 MG SL tablet Place 1 tablet (0.4 mg total) under the tongue every 5 (five) minutes as needed for chest pain. 25 tablet 3  . oxyCODONE (ROXICODONE) 5 MG immediate release tablet Take 1 tablet (5 mg total) by mouth every 6 (six) hours as needed. 15 tablet 0  . pantoprazole (PROTONIX) 40 MG tablet TAKE 1 TABLET(40 MG) BY MOUTH DAILY (Patient taking differently: Take 40 mg by mouth daily. ) 30 tablet 5  . potassium chloride SA (K-DUR,KLOR-CON) 20 MEQ tablet Take 20 mEq by mouth 2 (two) times daily. Morning & afternoon     No current facility-administered medications for this visit.     Allergies: Patient has no known allergies.  Past Medical History:  Diagnosis Date  . Anemia 06/2015  . Arthritis    knee  . Cancer [facility])    thyroid cancer  .  Cardiomyopathy ([place])    a. 10/2013: EF reduced to 35-40% b. 09/2014: EF improved to 55-60%, Grade 1 DD noted.  . Chest pain 06/2015  . CHF (congestive heart failure) ([place])   . CKD (chronic kidney disease), stage IV [facility])    sees  Dr Lorrene Reid  . Dyspnea    with exerion   . GERD (gastroesophageal reflux disease)   . Gout   . Hypertension   . Obesity   . Sleep apnea    not wearing c-pap now, needs new slep study per pt. (01/22/2017)  . Tuberculosis 1981   while the pt was in the service.  . Tubular adenoma of colon 10/2015  . Wears glasses     Past Surgical  History:  Procedure Laterality Date  . AV FISTULA PLACEMENT Left 03/25/2016   Procedure: Creation of Left Arm ARTERIOVENOUS (AV) FISTULA;  Surgeon: Elam Dutch, MD;  Location: [facility];  Service: Vascular;  Laterality: Left;  . COLONOSCOPY W/ BIOPSIES AND POLYPECTOMY     "no problem"  . HERNIA REPAIR    . LAPAROSCOPIC CHOLECYSTECTOMY    . LIGATION OF COMPETING BRANCHES OF ARTERIOVENOUS FISTULA Left 01/25/2018   Procedure: LIGATION OF COMPETING BRANCHES And Revision of ARTERIOVENOUS FISTULA LEFT ARM.;  Surgeon: Elam Dutch, MD;  Location: [facility];  Service: Vascular;  Laterality: Left;  . THYROIDECTOMY  01/22/2017  . THYROIDECTOMY Left 01/22/2017   Procedure: THYROIDECTOMY;  Surgeon: Melida Quitter, MD;  Location: [facility];  Service: ENT;  Laterality: Left;  . THYROIDECTOMY Right 03/26/2017   Procedure: COMPLETION OF THYROIDECTOMY;  Surgeon: Melida Quitter, MD;  Location: [facility];  Service: ENT;  Laterality: Right;  . UMBILICAL HERNIA REPAIR    . WISDOM TOOTH EXTRACTION      Family History  Problem Relation Age of Onset  . Stroke Mother   . Pneumonia Father        died of Pneumonia x3  . Kidney disease Maternal Grandmother   . Hypertension Maternal Grandmother   . Healthy Maternal Grandfather   . Healthy Paternal Grandmother   . Healthy Paternal Grandfather   . CAD Neg Hx   . Colon cancer Neg Hx   . Esophageal cancer Neg Hx   . Pancreatic cancer Neg Hx   . Prostate cancer Neg Hx   . Stomach cancer Neg Hx   . Rectal cancer Neg Hx     Social History   Tobacco Use  . Smoking status: Current Every Day Smoker    Packs/day: 0.50    Years: 40.00    Pack years: 20.00    Types: Cigarettes  . Smokeless tobacco: Never Used  Substance Use Topics  . Alcohol use: Not Currently    Alcohol/week: 0.0 standard drinks     Subjective:  Craig Flynn is here today requesting evaluation of cough, which has been ongoing since he was treated with augmentin course on 10/21 for acute sinusitis, he says  that most of his symptoms did improve with the augmentin course however hes continue to have postnasal drip, some intermittent nasal and sinus congestion, and cough with white sputum. He does notice occasional acid reflux after certain foods, like when he ate a pork chop the other day, but not daily, and reports he is taking his protonix 40 daily as prescribed, does try to avoid gerd triggers. He denies fevers, weakness, syncope, chills, headaches, body aches, chest pain. Smoker? Current  Tried at home: has not tried anything for symptoms at home aside from  protonix, wants to be careful with medications due to kidney function   ROS- See HPI  Objective:  Vitals:   03/02/18 0811  BP: (!) 120/50  Pulse: 86  Temp: 98.7 F (37.1 C)  TempSrc: Oral  SpO2: 98%  Weight: 280 lb (127 kg)  Height: 6\' 2"  (1.88 m)    General: Well developed, well nourished, in no acute distress  Skin : Warm and dry.  Head: Normocephalic and atraumatic  Eyes: Sclera and conjunctiva clear; pupils round and reactive to light; extraocular movements intact  Ears: External normal; canals clear; tympanic membranes normal  Oropharynx: Pink, supple. No suspicious lesions  Neck: Supple Lungs: Respirations unlabored; clear to auscultation bilaterally CVS exam: normal rate and regular rhythm, S1 and S2 normal.  Extremities: No edema, cyanosis Vessels: Symmetric bilaterally  Neurologic: Alert and oriented; speech intact; face symmetrical; moves all extremities well; CNII-XII intact without focal deficit  Psychiatric: Normal mood and affect.  Assessment:  1. Cough     Plan:   Could be related to allergies or GERD- will try treatment for allergies today and have him RTC in 2 weeks for follow up Home management, gerd triggers, red flags and return precautions including when to seek immediate care discussed and printed on AVS On chart review, it looks like hes had several visits for cough/sinus symptoms over past year, he  did have a CXR on 06/11/17 at an ED visit which was normal If his cough continues we will need to consider additional testing/treatment/referral  Return in about 2 weeks (around 03/16/2018) for F/U: cough.  No orders of the defined types were placed in this encounter.   Requested Prescriptions   Signed Prescriptions Disp Refills  . fluticasone (FLONASE) 50 MCG/ACT nasal spray 16 g 1    Sig: Place 2 sprays into both nostrils daily.

## 2018-03-10 ENCOUNTER — Encounter: Payer: Medicare HMO | Admitting: Vascular Surgery

## 2018-03-10 ENCOUNTER — Encounter (HOSPITAL_COMMUNITY): Payer: Medicare HMO

## 2018-03-10 ENCOUNTER — Inpatient Hospital Stay (HOSPITAL_COMMUNITY): Admission: RE | Admit: 2018-03-10 | Payer: Medicare HMO | Source: Ambulatory Visit

## 2018-03-11 ENCOUNTER — Encounter: Payer: Self-pay | Admitting: Vascular Surgery

## 2018-03-14 ENCOUNTER — Ambulatory Visit: Payer: Medicare HMO | Admitting: Nurse Practitioner

## 2018-03-14 DIAGNOSIS — Z0289 Encounter for other administrative examinations: Secondary | ICD-10-CM

## 2018-03-30 DIAGNOSIS — N2581 Secondary hyperparathyroidism of renal origin: Secondary | ICD-10-CM | POA: Diagnosis not present

## 2018-03-30 DIAGNOSIS — N185 Chronic kidney disease, stage 5: Secondary | ICD-10-CM | POA: Diagnosis not present

## 2018-03-30 DIAGNOSIS — M109 Gout, unspecified: Secondary | ICD-10-CM | POA: Diagnosis not present

## 2018-03-30 DIAGNOSIS — I120 Hypertensive chronic kidney disease with stage 5 chronic kidney disease or end stage renal disease: Secondary | ICD-10-CM | POA: Diagnosis not present

## 2018-03-30 DIAGNOSIS — C73 Malignant neoplasm of thyroid gland: Secondary | ICD-10-CM | POA: Diagnosis not present

## 2018-03-30 DIAGNOSIS — D631 Anemia in chronic kidney disease: Secondary | ICD-10-CM | POA: Diagnosis not present

## 2018-03-30 DIAGNOSIS — I770 Arteriovenous fistula, acquired: Secondary | ICD-10-CM | POA: Diagnosis not present

## 2018-03-30 DIAGNOSIS — N189 Chronic kidney disease, unspecified: Secondary | ICD-10-CM | POA: Diagnosis not present

## 2018-03-30 DIAGNOSIS — E876 Hypokalemia: Secondary | ICD-10-CM | POA: Diagnosis not present

## 2018-03-30 DIAGNOSIS — E669 Obesity, unspecified: Secondary | ICD-10-CM | POA: Diagnosis not present

## 2018-04-01 ENCOUNTER — Other Ambulatory Visit: Payer: Self-pay | Admitting: Nurse Practitioner

## 2018-04-04 ENCOUNTER — Ambulatory Visit: Payer: Medicare HMO | Admitting: Nurse Practitioner

## 2018-04-04 DIAGNOSIS — Z0289 Encounter for other administrative examinations: Secondary | ICD-10-CM

## 2018-04-22 ENCOUNTER — Other Ambulatory Visit: Payer: Self-pay

## 2018-04-22 DIAGNOSIS — N185 Chronic kidney disease, stage 5: Secondary | ICD-10-CM

## 2018-04-24 ENCOUNTER — Encounter (HOSPITAL_COMMUNITY): Payer: Self-pay | Admitting: Emergency Medicine

## 2018-04-24 ENCOUNTER — Ambulatory Visit (HOSPITAL_COMMUNITY)
Admission: EM | Admit: 2018-04-24 | Discharge: 2018-04-24 | Disposition: A | Discharge status: Home / Self Care | Payer: Medicare HMO | Attending: Family Medicine | Admitting: Family Medicine

## 2018-04-24 DIAGNOSIS — S161XXA Strain of muscle, fascia and tendon at neck level, initial encounter: Secondary | ICD-10-CM | POA: Diagnosis not present

## 2018-04-24 MED ORDER — PREDNISONE 10 MG (21) PO TBPK: ORAL_TABLET | Freq: Every day | ORAL | 0 refills | Status: DC

## 2018-04-24 NOTE — ED Provider Notes (Signed)
[place]    CSN: [id number] Arrival date & time: 04/24/18  1629     History   Chief Complaint No chief complaint on file.   HPI Craig Flynn is a 58 y.o. male.   58 year old male with history of CHF, CKD stage 5, comes in for few day history of neck soreness. Denies injury/trauma. States neck feels stiff and hard to turn head left and right. Denies radiation of pain, numbness/tingling of fingers. Denies chest pain, shortness of breath. Has been using heat compress and tylenol without relief.      Past Medical History:  Diagnosis Date  . Anemia 06/2015  . Arthritis    knee  . Cancer [facility])    thyroid cancer  . Cardiomyopathy ([place])    a. 10/2013: EF reduced to 35-40% b. 09/2014: EF improved to 55-60%, Grade 1 DD noted.  . Chest pain 06/2015  . CHF (congestive heart failure) ([place])   . CKD (chronic kidney disease), stage IV [facility])    sees  Dr Lorrene Reid  . Dyspnea    with exerion   . GERD (gastroesophageal reflux disease)   . Gout   . Hypertension   . Obesity   . Sleep apnea    not wearing c-pap now, needs new slep study per pt. (01/22/2017)  . Tuberculosis 1981   while the pt was in the service.  . Tubular adenoma of colon 10/2015  . Wears glasses     Patient Active Problem List   Diagnosis Date Noted  . Follicular thyroid cancer ([place]) 03/26/2017  . Thyroid nodule 01/22/2017  . Ptosis 01/09/2016  . Chronic systolic heart failure ([place]) 08/01/2015  . GERD (gastroesophageal reflux disease) 07/29/2015  . Osteoarthritis 05/03/2015  . Hyperlipidemia 02/26/2015  . Diastolic dysfunction 16/03/930  . Tobacco abuse 02/26/2015  . OSA (obstructive sleep apnea) 12/27/2014  . Routine general medical examination at a health care facility 11/12/2014  . Anemia in chronic kidney disease 11/12/2014  . Left knee pain 09/22/2014  . Systolic CHF ([place]) 35/57/3220  . Paresthesia of left leg 09/21/2014  . Acute systolic CHF (congestive heart failure) ([place]) 11/17/2013  .  CKD (chronic kidney disease) stage 4, GFR 15-29 ml/min (HCC) 11/17/2013  . Morbid obesity ([place]) 11/17/2013  . Diabetes mellitus ([place]) 12/12/2011  . Acute on chronic renal failure ([place]) 12/11/2011  . Hypokalemia 12/11/2011  . HTN (hypertension) 12/11/2011  . Gout 12/31/2006  . Essential hypertension 12/31/2006  . HERNIATED LUMBAR DISK WITH RADICULOPATHY 12/30/2006    Past Surgical History:  Procedure Laterality Date  . AV FISTULA PLACEMENT Left 03/25/2016   Procedure: Creation of Left Arm ARTERIOVENOUS (AV) FISTULA;  Surgeon: Elam Dutch, MD;  Location[facility];  Service: Vascular;  Laterality: Left;  . COLONOSCOPY W/ BIOPSIES AND POLYPECTOMY     "no problem"  . HERNIA REPAIR    . LAPAROSCOPIC CHOLECYSTECTOMY    . LIGATION OF COMPETING BRANCHES OF ARTERIOVENOUS FISTULA Left 01/25/2018   Procedure: LIGATION OF COMPETING BRANCHES And Revision of ARTERIOVENOUS FISTULA LEFT ARM.;  Surgeon: Elam Dutch, MD;  Location: [place];  Service: Vascular;  Laterality: Left;  . THYROIDECTOMY  01/22/2017  . THYROIDECTOMY Left 01/22/2017   Procedure: THYROIDECTOMY;  Surgeon: Melida Quitter, MD;  Location: [place];  Service: ENT;  Laterality: Left;  . THYROIDECTOMY Right 03/26/2017   Procedure: COMPLETION OF THYROIDECTOMY;  Surgeon: Melida Quitter, MD;  Location: [place];  Service: ENT;  Laterality: Right;  . UMBILICAL HERNIA REPAIR    .  WISDOM TOOTH EXTRACTION         Home Medications    Prior to Admission medications   Medication Sig Start Date End Date Taking? Authorizing Provider  acetaminophen (TYLENOL) 500 MG tablet Take 1,000 mg by mouth every 6 (six) hours as needed for mild pain or headache.     [provider]  allopurinol (ZYLOPRIM) 300 MG tablet TAKE 1 TABLET BY MOUTH DAILY 04/04/18   Lance Sell, NP  aspirin EC 81 MG tablet Take 81 mg by mouth daily.    [provider]  calcitRIOL (ROCALTROL) 0.25 MCG capsule Take 1 capsule by mouth 3 (three) times a week.  01/27/18   [provider]  carvedilol (COREG) 25 MG tablet TAKE 1 TABLET BY MOUTH TWICE DAILY Patient taking differently: Take 25 mg by mouth daily.  10/11/17   Croitoru, Mihai, MD  fenofibrate 54 MG tablet TAKE 1 TABLET BY MOUTH ONCE DAILY Patient taking differently: Take 54 mg by mouth daily.  09/30/17   Minus Breeding, MD  fluticasone (FLONASE) 50 MCG/ACT nasal spray SHAKE LIQUID AND USE 2 SPRAYS IN EACH NOSTRIL DAILY 03/02/18   Lance Sell, NP  furosemide (LASIX) 80 MG tablet Take 1 tablet (80 mg total) by mouth 2 (two) times daily. Patient taking differently: Take 160 mg by mouth 2 (two) times daily. Morning & afternoon 02/27/15   Reyne Dumas, MD  isosorbide mononitrate (IMDUR) 30 MG 24 hr tablet TAKE 2 TABLETS(60 MG) BY MOUTH DAILY Patient taking differently: Take 30 mg by mouth 2 (two) times daily.  11/26/17   Lance Sell, NP  levothyroxine (SYNTHROID) 200 MCG tablet Take 1 tablet (200 mcg total) by mouth daily. 03/26/17 03/26/18  Melida Quitter, MD  nitroGLYCERIN (NITROSTAT) 0.4 MG SL tablet Place 1 tablet (0.4 mg total) under the tongue every 5 (five) minutes as needed for chest pain. 06/14/17   Minus Breeding, MD  oxyCODONE (ROXICODONE) 5 MG immediate release tablet Take 1 tablet (5 mg total) by mouth every 6 (six) hours as needed. 01/25/18   Rhyne, Hulen Shouts, PA-C  pantoprazole (PROTONIX) 40 MG tablet TAKE 1 TABLET(40 MG) BY MOUTH DAILY Patient taking differently: Take 40 mg by mouth daily.  12/09/17   Lance Sell, NP  potassium chloride SA (K-DUR,KLOR-CON) 20 MEQ tablet Take 20 mEq by mouth 2 (two) times daily. Morning & afternoon    [provider]  predniSONE (STERAPRED UNI-PAK 21 TAB) 10 MG (21) TBPK tablet Take by mouth daily. Take 6 tabs by mouth day 1, then 5 tabs, then 4 tabs, then 3 tabs, 2 tabs, then 1 tab for the last day 04/24/18   Ok Edwards, PA-C    Family History Family History  Problem Relation Age of Onset  . Stroke Mother   .  Pneumonia Father        died of Pneumonia x3  . Kidney disease Maternal Grandmother   . Hypertension Maternal Grandmother   . Healthy Maternal Grandfather   . Healthy Paternal Grandmother   . Healthy Paternal Grandfather   . CAD Neg Hx   . Colon cancer Neg Hx   . Esophageal cancer Neg Hx   . Pancreatic cancer Neg Hx   . Prostate cancer Neg Hx   . Stomach cancer Neg Hx   . Rectal cancer Neg Hx     Social History Social History   Tobacco Use  . Smoking status: Current Every Day Smoker    Packs/day: 0.50  Years: 40.00    Pack years: 20.00    Types: Cigarettes  . Smokeless tobacco: Never Used  Substance Use Topics  . Alcohol use: Not Currently    Alcohol/week: 0.0 standard drinks  . Drug use: No     Allergies   Patient has no known allergies.   Review of Systems Review of Systems  Reason unable to perform ROS: See HPI as above.     Physical Exam Triage Vital Signs ED Triage Vitals  Enc Vitals Group     BP 04/24/18 1710 (!) 154/76     Pulse Rate 04/24/18 1710 82     Resp 04/24/18 1710 16     Temp 04/24/18 1710 99 F (37.2 C)     Temp src --      SpO2 04/24/18 1710 100 %     Weight --      Height --      Head Circumference --      Peak Flow --      Pain Score 04/24/18 1711 9     Pain Loc --      Pain Edu? --      Excl. in [place]? --    No data found.  Updated Vital Signs BP (!) 154/76   Pulse 82   Temp 99 F (37.2 C)   Resp 16   SpO2 100%   Visual Acuity Right Eye Distance:   Left Eye Distance:   Bilateral Distance:    Right Eye Near:   Left Eye Near:    Bilateral Near:     Physical Exam Constitutional:      General: He is not in acute distress.    Appearance: He is well-developed. He is not ill-appearing, toxic-appearing or diaphoretic.  HENT:     Head: Normocephalic and atraumatic.  Eyes:     Conjunctiva/sclera: Conjunctivae normal.     Pupils: Pupils are equal, round, and reactive to light.  Neck:     Musculoskeletal: Normal range  of motion and neck supple. No spinous process tenderness.     Comments: Tenderness to palpation of bilateral neck.  Cardiovascular:     Rate and Rhythm: Normal rate and regular rhythm.  Pulmonary:     Effort: Pulmonary effort is normal. No tachypnea, accessory muscle usage or respiratory distress.  Musculoskeletal:     Comments: No tenderness to palpation of spinous processes. Mild tenderness to palpation of upper trapezius muscle bilaterally.   Neurological:     Mental Status: He is alert and oriented to person, place, and time.      UC Treatments / Results  Labs (all labs ordered are listed, but only abnormal results are displayed) Labs Reviewed - No data to display  EKG None  Radiology No results found.  Procedures Procedures (including critical care time)  Medications Ordered in UC Medications - No data to display  Initial Impression / Assessment and Plan / UC Course  I have reviewed the triage vital signs and the nursing notes.  Pertinent labs & imaging results that were available during my care of the patient were reviewed by me and considered in my medical decision making (see chart for details).    Prednisone as directed. Continue compresses. Return precautions given.  Final Clinical Impressions(s) / UC Diagnoses   Final diagnoses:  Strain of neck muscle, initial encounter    ED Prescriptions    Medication Sig Dispense Auth. Provider   predniSONE (STERAPRED UNI-PAK 21 TAB) 10 MG (21) TBPK tablet Take  by mouth daily. Take 6 tabs by mouth day 1, then 5 tabs, then 4 tabs, then 3 tabs, 2 tabs, then 1 tab for the last day 21 tablet Tobin Chad, PA-C 04/24/18 1747

## 2018-04-24 NOTE — Discharge Instructions (Addendum)
Start prednisone as directed. This can take up to 3-4 weeks to completely resolve, but you should be feeling better each week. Follow up here or with PCP if symptoms worsen, changes for reevaluation.

## 2018-04-24 NOTE — ED Triage Notes (Signed)
Pt c/o muscle pain in the back of his neck x3 days, states ibuprofen hasn't helped, states its painful to turn his head.

## 2018-05-04 ENCOUNTER — Ambulatory Visit (HOSPITAL_COMMUNITY)
Admission: RE | Admit: 2018-05-04 | Discharge: 2018-05-04 | Disposition: A | Discharge status: Home / Self Care | Payer: Medicare HMO | Source: Ambulatory Visit | Attending: Vascular Surgery | Admitting: Vascular Surgery

## 2018-05-04 ENCOUNTER — Encounter: Payer: Self-pay | Admitting: Vascular Surgery

## 2018-05-04 ENCOUNTER — Other Ambulatory Visit: Payer: Self-pay

## 2018-05-04 ENCOUNTER — Ambulatory Visit (INDEPENDENT_AMBULATORY_CARE_PROVIDER_SITE_OTHER)
Admission: RE | Admit: 2018-05-04 | Discharge: 2018-05-04 | Disposition: A | Discharge status: Home / Self Care | Payer: Medicare HMO | Source: Ambulatory Visit | Attending: Vascular Surgery | Admitting: Vascular Surgery

## 2018-05-04 ENCOUNTER — Ambulatory Visit: Payer: Medicare HMO | Admitting: Vascular Surgery

## 2018-05-04 VITALS — BP 132/68 | HR 75 | Temp 97.7°F | Resp 20 | Ht 74.0 in | Wt 280.0 lb

## 2018-05-04 DIAGNOSIS — N185 Chronic kidney disease, stage 5: Secondary | ICD-10-CM

## 2018-05-04 DIAGNOSIS — N184 Chronic kidney disease, stage 4 (severe): Secondary | ICD-10-CM | POA: Diagnosis not present

## 2018-05-04 NOTE — Progress Notes (Signed)
REASON FOR CONSULT:    To evaluate for hemodialysis access.  The consult is requested by Dr. Jamal Maes.  HPI:   Craig Flynn is a pleasant 58 y.o. male, who is referred for evaluation for hemodialysis access.  Of note if an AV fistula is not possible we have been asked to wait before placing an AV graft.  The patient was seen on 03/30/2018.  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  The patient has a history of coronary artery disease, diastolic dysfunction and congestive heart failure.  I reviewed the most recent labs which show a GFR of 15 on 03/30/2018.  Previous renal ultrasound showed cortical thickening.  The right kidney measured 10.8 cm in length and the left kidney 10.6 cm in length.  Of note the patient has had a previous left brachiocephalic fistula in January 2018 this did not mature.  Patient had a revision with ligation of several side branches.  The patient has end-stage renal disease secondary to hypertension.  He is not yet on dialysis.  He denies any recent uremic symptoms.  Specifically he denies nausea, vomiting, fatigue, or anorexia.  He has had some leg swelling and some mild dyspnea on exertion.  Past Medical History:  Diagnosis Date  . Anemia 06/2015  . Arthritis    knee  . Cancer [facility])    thyroid cancer  . Cardiomyopathy ([place])    a. 10/2013: EF reduced to 35-40% b. 09/2014: EF improved to 55-60%, Grade 1 DD noted.  . Chest pain 06/2015  . CHF (congestive heart failure) ([place])   . CKD (chronic kidney disease), stage IV [facility])    sees  Dr Lorrene Reid  . Dyspnea    with exerion   . GERD (gastroesophageal reflux disease)   . Gout   . Hypertension   . Obesity   . Sleep apnea    not wearing c-pap now, needs new slep study per pt. (01/22/2017)  . Tuberculosis 1981   while the pt was in the service.  . Tubular adenoma of colon 10/2015  . Wears glasses     Family History  Problem Relation Age of Onset  . Stroke Mother   . Pneumonia Father      died of Pneumonia x3  . Kidney disease Maternal Grandmother   . Hypertension Maternal Grandmother   . Healthy Maternal Grandfather   . Healthy Paternal Grandmother   . Healthy Paternal Grandfather   . CAD Neg Hx   . Colon cancer Neg Hx   . Esophageal cancer Neg Hx   . Pancreatic cancer Neg Hx   . Prostate cancer Neg Hx   . Stomach cancer Neg Hx   . Rectal cancer Neg Hx     SOCIAL HISTORY: Social History   Socioeconomic History  . Marital status: Divorced    Spouse name: Not on file  . Number of children: 2  . Years of education: 17  . Highest education level: Not on file  Occupational History  . Occupation: Disability    Comment: Knees   Social Needs  . Financial resource strain: Not on file  . Food insecurity:    Worry: Not on file    Inability: Not on file  . Transportation needs:    Medical: Not on file    Non-medical: Not on file  Tobacco Use  . Smoking status: Current Every Day Smoker    Packs/day: 0.50    Years: 40.00    Pack years:  20.00    Types: Cigarettes  . Smokeless tobacco: Never Used  Substance and Sexual Activity  . Alcohol use: Not Currently    Alcohol/week: 0.0 standard drinks  . Drug use: No  . Sexual activity: Yes  Lifestyle  . Physical activity:    Days per week: Not on file    Minutes per session: Not on file  . Stress: Not on file  Relationships  . Social connections:    Talks on phone: Not on file    Gets together: Not on file    Attends religious service: Not on file    Active member of club or organization: Not on file    Attends meetings of clubs or organizations: Not on file    Relationship status: Not on file  . Intimate partner violence:    Fear of current or ex partner: Not on file    Emotionally abused: Not on file    Physically abused: Not on file    Forced sexual activity: Not on file  Other Topics Concern  . Not on file  Social History Narrative   Fun: Golf, basketball    Denies religious beliefs effecting health  care.     No Known Allergies  Current Outpatient Medications  Medication Sig Dispense Refill  . acetaminophen (TYLENOL) 500 MG tablet Take 1,000 mg by mouth every 6 (six) hours as needed for mild pain or headache.     . allopurinol (ZYLOPRIM) 300 MG tablet TAKE 1 TABLET BY MOUTH DAILY 90 tablet 1  . aspirin EC 81 MG tablet Take 81 mg by mouth daily.    . calcitRIOL (ROCALTROL) 0.25 MCG capsule Take 1 capsule by mouth 3 (three) times a week.  5  . carvedilol (COREG) 25 MG tablet TAKE 1 TABLET BY MOUTH TWICE DAILY (Patient taking differently: Take 25 mg by mouth daily. ) 180 tablet 2  . fenofibrate 54 MG tablet TAKE 1 TABLET BY MOUTH ONCE DAILY (Patient taking differently: Take 54 mg by mouth daily. ) 90 tablet 2  . fluticasone (FLONASE) 50 MCG/ACT nasal spray SHAKE LIQUID AND USE 2 SPRAYS IN EACH NOSTRIL DAILY 48 g 1  . furosemide (LASIX) 80 MG tablet Take 1 tablet (80 mg total) by mouth 2 (two) times daily. (Patient taking differently: Take 160 mg by mouth 2 (two) times daily. Morning & afternoon) 60 tablet 1  . isosorbide mononitrate (IMDUR) 30 MG 24 hr tablet TAKE 2 TABLETS(60 MG) BY MOUTH DAILY (Patient taking differently: Take 30 mg by mouth 2 (two) times daily. ) 60 tablet 5  . nitroGLYCERIN (NITROSTAT) 0.4 MG SL tablet Place 1 tablet (0.4 mg total) under the tongue every 5 (five) minutes as needed for chest pain. 25 tablet 3  . pantoprazole (PROTONIX) 40 MG tablet TAKE 1 TABLET(40 MG) BY MOUTH DAILY (Patient taking differently: Take 40 mg by mouth daily. ) 30 tablet 5  . potassium chloride SA (K-DUR,KLOR-CON) 20 MEQ tablet Take 20 mEq by mouth 2 (two) times daily. Morning & afternoon    . levothyroxine (SYNTHROID) 200 MCG tablet Take 1 tablet (200 mcg total) by mouth daily. 30 tablet 11   No current facility-administered medications for this visit.     REVIEW OF SYSTEMS:  [X]  denotes positive finding, [ ]  denotes negative finding Cardiac  Comments:  Chest pain or chest pressure:      Shortness of breath upon exertion:    Short of breath when lying flat:    Irregular heart rhythm:  Vascular    Pain in calf, thigh, or hip brought on by ambulation:    Pain in feet at night that wakes you up from your sleep:     Blood clot in your veins:    Leg swelling:         Pulmonary    Oxygen at home:    Productive cough:     Wheezing:         Neurologic    Sudden weakness in arms or legs:     Sudden numbness in arms or legs:     Sudden onset of difficulty speaking or slurred speech:    Temporary loss of vision in one eye:     Problems with dizziness:         Gastrointestinal    Blood in stool:     Vomited blood:         Genitourinary    Burning when urinating:     Blood in urine:        Psychiatric    Major depression:         Hematologic    Bleeding problems:    Problems with blood clotting too easily:        Skin    Rashes or ulcers:        Constitutional    Fever or chills:     PHYSICAL EXAM:   Vitals:   05/04/18 1334  BP: 132/68  Pulse: 75  Resp: 20  Temp: 97.7 F (36.5 C)  SpO2: 99%  Weight: 280 lb (127 kg)  Height: 6\' 2"  (1.88 m)    GENERAL: The patient is a well-nourished male, in no acute distress. The vital signs are documented above. CARDIAC: There is a regular rate and rhythm.  VASCULAR: I do not detect carotid bruits. He has palpable brachial and radial pulses bilaterally. PULMONARY: There is good air exchange bilaterally without wheezing or rales. ABDOMEN: Soft and non-tender with normal pitched bowel sounds.  MUSCULOSKELETAL: There are no major deformities or cyanosis. NEUROLOGIC: No focal weakness or paresthesias are detected. SKIN: There are no ulcers or rashes noted. PSYCHIATRIC: The patient has a normal affect.  DATA:    VEIN MAP: I have independently interpreted his vein map today.  On the right side the forearm cephalic vein distally is somewhat small.  The upper arm cephalic vein looks reasonable in size.  The  basilic vein on the right looks reasonable in size.  On the left side the upper arm cephalic vein is thrombosed where he had a previous brachiocephalic fistula.  The basilic vein on the left is marginal in size in the distal upper arm.  ARTERIAL DOPPLER STUDY: I have independently interpreted his upper extremity arterial Doppler study.  On the right side there is a triphasic radial and ulnar waveform.  The brachial artery measures 0.6 cm in diameter.  On the left side, there is a triphasic radial and ulnar waveform.  The brachial artery measures 0.62 cm in diameter.  ASSESSMENT & PLAN:   STAGE V CHRONIC KIDNEY DISEASE: Based on the vein map the patient appears to be a good candidate for a right brachiocephalic AV fistula.  If a fistula is not possible we have been asked not to place an AV graft at this time.  He feels strongly about not having access in the right arm yet.  For this reason I think it would be reasonable to try a basilic vein transposition.  I have explained that if the  vein looks good then we could potentially do this in one stage.  If the vein is marginal we would connect the basilic vein to the brachial artery and be sure that this matures adequately before transposing it.  I have discussed the indications for the procedure and the potential complications including but not limited to bleeding, wound healing problems, failure of the fistula to mature, the need for further intervention, and steal symptoms.  All of his questions were answered.  He will discuss with his wife when he can schedule surgery.   Deitra Mayo [facility] [phone]

## 2018-05-05 ENCOUNTER — Encounter: Payer: Self-pay | Admitting: Nurse Practitioner

## 2018-05-05 ENCOUNTER — Ambulatory Visit (INDEPENDENT_AMBULATORY_CARE_PROVIDER_SITE_OTHER): Payer: Medicare HMO | Admitting: Nurse Practitioner

## 2018-05-05 ENCOUNTER — Ambulatory Visit (INDEPENDENT_AMBULATORY_CARE_PROVIDER_SITE_OTHER)
Admission: RE | Admit: 2018-05-05 | Discharge: 2018-05-05 | Disposition: A | Discharge status: Home / Self Care | Payer: Medicare HMO | Source: Ambulatory Visit | Attending: Nurse Practitioner | Admitting: Nurse Practitioner

## 2018-05-05 VITALS — BP 120/58 | HR 68 | Temp 98.4°F | Ht 74.0 in | Wt 286.0 lb

## 2018-05-05 DIAGNOSIS — M542 Cervicalgia: Secondary | ICD-10-CM

## 2018-05-05 MED ORDER — ACETAMINOPHEN 500 MG PO TABS: 1000.0000 mg | ORAL_TABLET | Freq: Three times a day (TID) | ORAL | 0 refills | Status: DC | PRN

## 2018-05-05 NOTE — Progress Notes (Signed)
HEINRICH FERTIG is a 58 y.o. male with the following history as recorded in EpicCare:  Patient Active Problem List   Diagnosis Date Noted  . 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 Thyroid nodule 01/22/2017  . Ptosis 01/09/2016  . Chronic systolic heart failure ([place]) 08/01/2015  . GERD (gastroesophageal reflux disease) 07/29/2015  . Osteoarthritis 05/03/2015  . Hyperlipidemia 02/26/2015  . Diastolic dysfunction 62/95/2841  . Tobacco abuse 02/26/2015  . OSA (obstructive sleep apnea) 12/27/2014  . Routine general medical examination at a health care facility 11/12/2014  . Anemia in chronic kidney disease 11/12/2014  . Left knee pain 09/22/2014  . Systolic CHF ([place]) 32/44/0102  . Paresthesia of left leg 09/21/2014  . Acute systolic CHF (congestive heart failure) ([place]) 11/17/2013  . CKD (chronic kidney disease) stage 4, GFR 15-29 ml/min (HCC) 11/17/2013  . Morbid obesity ([place]) 11/17/2013  . Diabetes mellitus ([place]) 12/12/2011  . Acute on chronic renal failure ([place]) 12/11/2011  . Hypokalemia 12/11/2011  . HTN (hypertension) 12/11/2011  . Gout 12/31/2006  . Essential hypertension 12/31/2006  . HERNIATED LUMBAR DISK WITH RADICULOPATHY 12/30/2006    Current Outpatient Medications  Medication Sig Dispense Refill  . acetaminophen (TYLENOL) 500 MG tablet Take 2 tablets (1,000 mg total) by mouth every 8 (eight) hours as needed for mild pain or headache. 60 tablet 0  . allopurinol (ZYLOPRIM) 300 MG tablet TAKE 1 TABLET BY MOUTH DAILY 90 tablet 1  . aspirin EC 81 MG tablet Take 81 mg by mouth daily.    . calcitRIOL (ROCALTROL) 0.25 MCG capsule Take 1 capsule by mouth 3 (three) times a week.  5  . carvedilol (COREG) 25 MG tablet TAKE 1 TABLET BY MOUTH TWICE DAILY (Patient taking differently: Take 25 mg by mouth daily. ) 180 tablet 2  . fenofibrate 54 MG tablet TAKE 1 TABLET BY MOUTH ONCE DAILY (Patient taking differently: Take 54 mg by mouth daily. ) 90 tablet 2  . fluticasone (FLONASE) 50  MCG/ACT nasal spray SHAKE LIQUID AND USE 2 SPRAYS IN EACH NOSTRIL DAILY 48 g 1  . furosemide (LASIX) 80 MG tablet Take 1 tablet (80 mg total) by mouth 2 (two) times daily. (Patient taking differently: Take 160 mg by mouth 2 (two) times daily. Morning & afternoon) 60 tablet 1  . isosorbide mononitrate (IMDUR) 30 MG 24 hr tablet TAKE 2 TABLETS(60 MG) BY MOUTH DAILY (Patient taking differently: Take 30 mg by mouth 2 (two) times daily. ) 60 tablet 5  . nitroGLYCERIN (NITROSTAT) 0.4 MG SL tablet Place 1 tablet (0.4 mg total) under the tongue every 5 (five) minutes as needed for chest pain. 25 tablet 3  . pantoprazole (PROTONIX) 40 MG tablet TAKE 1 TABLET(40 MG) BY MOUTH DAILY (Patient taking differently: Take 40 mg by mouth daily. ) 30 tablet 5  . potassium chloride SA (K-DUR,KLOR-CON) 20 MEQ tablet Take 20 mEq by mouth 2 (two) times daily. Morning & afternoon    . levothyroxine (SYNTHROID) 200 MCG tablet Take 1 tablet (200 mcg total) by mouth daily. 30 tablet 11   No current facility-administered medications for this visit.     Allergies: Patient has no known allergies.  Past Medical History:  Diagnosis Date  . Anemia 06/2015  . Arthritis    knee  . Cancer [facility])    thyroid cancer  . Cardiomyopathy ([place])    a. 10/2013: EF reduced to 35-40% b. 09/2014: EF improved to 55-60%, Grade 1 DD noted.  . Chest pain 06/2015  .  CHF (congestive heart failure) ([place])   . CKD (chronic kidney disease), stage IV [facility])    sees  Dr Lorrene Reid  . Dyspnea    with exerion   . GERD (gastroesophageal reflux disease)   . Gout   . Hypertension   . Obesity   . Sleep apnea    not wearing c-pap now, needs new slep study per pt. (01/22/2017)  . Tuberculosis 1981   while the pt was in the service.  . Tubular adenoma of colon 10/2015  . Wears glasses     Past Surgical History:  Procedure Laterality Date  . AV FISTULA PLACEMENT Left 03/25/2016   Procedure: Creation of Left Arm ARTERIOVENOUS (AV) FISTULA;  Surgeon: Elam Dutch, MD;  Location: [facility] OR;  Service: Vascular;  Laterality: Left;  . COLONOSCOPY W/ BIOPSIES AND POLYPECTOMY     "no problem"  . HERNIA REPAIR    . LAPAROSCOPIC CHOLECYSTECTOMY    . LIGATION OF COMPETING BRANCHES OF ARTERIOVENOUS FISTULA Left 01/25/2018   Procedure: LIGATION OF COMPETING BRANCHES And Revision of ARTERIOVENOUS FISTULA LEFT ARM.;  Surgeon: Elam Dutch, MD;  Location: [place];  Service: Vascular;  Laterality: Left;  . THYROIDECTOMY  01/22/2017  . THYROIDECTOMY Left 01/22/2017   Procedure: THYROIDECTOMY;  Surgeon: Melida Quitter, MD;  Location: [place];  Service: ENT;  Laterality: Left;  . THYROIDECTOMY Right 03/26/2017   Procedure: COMPLETION OF THYROIDECTOMY;  Surgeon: Melida Quitter, MD;  Location: [place];  Service: ENT;  Laterality: Right;  . UMBILICAL HERNIA REPAIR    . WISDOM TOOTH EXTRACTION      Family History  Problem Relation Age of Onset  . Stroke Mother   . Pneumonia Father        died of Pneumonia x3  . Kidney disease Maternal Grandmother   . Hypertension Maternal Grandmother   . Healthy Maternal Grandfather   . Healthy Paternal Grandmother   . Healthy Paternal Grandfather   . CAD Neg Hx   . Colon cancer Neg Hx   . Esophageal cancer Neg Hx   . Pancreatic cancer Neg Hx   . Prostate cancer Neg Hx   . Stomach cancer Neg Hx   . Rectal cancer Neg Hx     Social History   Tobacco Use  . Smoking status: Current Every Day Smoker    Packs/day: 0.50    Years: 40.00    Pack years: 20.00    Types: Cigarettes  . Smokeless tobacco: Never Used  Substance Use Topics  . Alcohol use: Not Currently    Alcohol/week: 0.0 standard drinks     Subjective:  Mr Crispin Is here today requesting evaluation of right posterior neck pain, which first began about 2 weeks ago. He was seen at [facility] and dx with muscle strain, treated with prednisone pack which he has completed, pain improved some, now only on right side of neck no longer bilateral as it was prior to prednisone, but  pain has persisted. He says the pain is  A "throbbing strain" in his right post neck. Pain does not radiate. Pain is worse with palpation. He says he is concerned he may have a sinus infection, hes had similar pain in the past and was treated for a sinus infection. Aside from prednisone, He has also tried tylenol with minimal relief. Denies fevers, chills, weakness, syncope, confusion, ear pain/pressure, sinus pain/ pressure, nasal congestion, nasal drainage, vision changes, sore throat, cough, cp, sob Denies any recent falls, injuries, heavy lifting  ROS-  See HPI   Objective:  Vitals:   05/05/18 0930  BP: (!) 120/58  Pulse: 68  Temp: 98.4 F (36.9 C)  TempSrc: Oral  SpO2: 99%  Weight: 286 lb (129.7 kg)  Height: 6\' 2"  (1.88 m)    General: Well developed, well nourished, in no acute distress  Skin : Warm and dry. No rash, erythema, bruising Head: Normocephalic and atraumatic  Eyes: Sclera and conjunctiva clear; pupils round and reactive to light; extraocular movements intact  Ears: External normal; canals clear; tympanic membranes normal  Nose: No maxillary or frontal sinus tenderness Oropharynx: Pink, supple. No suspicious lesions  Neck: Supple without thyromegaly, adenopathy  Lungs: Respirations unlabored; clear to auscultation bilaterally without wheeze, rales, rhonchi  CVS exam: normal rate and regular rhythm, S1 and S2 normal.  Musculoskeletal: No deformities; no active joint inflammation, normal ROM    Cervical back: He exhibits tenderness. He exhibits no swelling, no edema and no deformity.  Extremities: No edema, cyanosis, clubbing  Vessels: Symmetric bilaterally  Neurologic: Alert and oriented; speech intact; face symmetrical; moves all extremities well; CNII-XII intact without focal deficit  Psychiatric: Normal mood and affect.   Assessment:  1. Neck pain     Plan:   Suspect MSK etiology over sinus infection, as he has no other signs of sinus infection on H or PE  today We discussed this, he strongly feels he has sinus infection but says he will try treatment for neck strain Home management, scheduled tylenol, exercises, heat, red flags and return precautions including when to seek immediate care discussed and printed on AVS Will update imaging today for further evaluation- could consider referral to [facility] depending on xray results   No follow-ups on file.  Orders Placed This Encounter  Procedures  . DG Cervical Spine Complete    Standing Status:   Future    Number of Occurrences:   1    Standing Expiration Date:   07/04/2019    Order Specific Question:   Reason for Exam (SYMPTOM  OR DIAGNOSIS REQUIRED)    Answer:   neck pain    Order Specific Question:   Preferred imaging location?    Answer:   [facility]    Order Specific Question:   Radiology Contrast Protocol - do NOT remove file path    Answer:   [email or web address]    Requested Prescriptions   Signed Prescriptions Disp Refills  . acetaminophen (TYLENOL) 500 MG tablet 60 tablet 0    Sig: Take 2 tablets (1,000 mg total) by mouth every 8 (eight) hours as needed for mild pain or headache.

## 2018-05-05 NOTE — Patient Instructions (Addendum)
Head downstairs for neck xray.  Start tylenol 1000mg  every 8 hours for the next 3 days  Heat as discussed   Neck Exercises Neck exercises can be important for many reasons:  They can help you to improve and maintain flexibility in your neck. This can be especially important as you age.  They can help to make your neck stronger. This can make movement easier.  They can reduce or prevent neck pain.  They may help your upper back. Ask your health care provider which neck exercises would be best for you. Exercises to improve neck flexibility Neck stretch Repeat this exercise 3-5 times. 1. Do this exercise while standing or while sitting in a chair. 2. Place your feet flat on the floor, shoulder-width apart. 3. Slowly turn your head to the right. Turn it all the way to the right so you can look over your right shoulder. Do not tilt or tip your head. 4. Hold this position for 10-30 seconds. 5. Slowly turn your head to the left, to look over your left shoulder. 6. Hold this position for 10-30 seconds.  Neck retraction Repeat this exercise 8-10 times. Do this 3-4 times a day or as told by your health care provider. 1. Do this exercise while standing or while sitting in a sturdy chair. 2. Look straight ahead. Do not bend your neck. 3. Use your fingers to push your chin backward. Do not bend your neck for this movement. Continue to face straight ahead. If you are doing the exercise properly, you will feel a slight sensation in your throat and a stretch at the back of your neck. 4. Hold the stretch for 1-2 seconds. Relax and repeat. Exercises to improve neck strength Neck press Repeat this exercise 10 times. Do it first thing in the morning and right before bed or as told by your health care provider. 1. Lie on your back on a firm bed or on the floor with a pillow under your head. 2. Use your neck muscles to push your head down on the pillow and straighten your spine. 3. Hold the position  as well as you can. Keep your head facing up and your chin tucked. 4. Slowly count to 5 while holding this position. 5. Relax for a few seconds. Then repeat. Isometric strengthening Do a full set of these exercises 2 times a day or as told by your health care provider. 1. Sit in a supportive chair and place your hand on your forehead. 2. Push forward with your head and neck while pushing back with your hand. Hold for 10 seconds. 3. Relax. Then repeat the exercise 3 times. 4. Next, do thesequence again, this time putting your hand against the back of your head. Use your head and neck to push backward against the hand pressure. 5. Finally, do the same exercise on either side of your head, pushing sideways against the pressure of your hand. Prone head lifts Repeat this exercise 5 times. Do this 2 times a day or as told by your health care provider. 1. Lie face-down, resting on your elbows so that your chest and upper back are raised. 2. Start with your head facing downward, near your chest. Position your chin either on or near your chest. 3. Slowly lift your head upward. Lift until you are looking straight ahead. Then continue lifting your head as far back as you can stretch. 4. Hold your head up for 5 seconds. Then slowly lower it to your starting position. Supine  head lifts Repeat this exercise 8-10 times. Do this 2 times a day or as told by your health care provider. 1. Lie on your back, bending your knees to point to the ceiling and keeping your feet flat on the floor. 2. Lift your head slowly off the floor, raising your chin toward your chest. 3. Hold for 5 seconds. 4. Relax and repeat. Scapular retraction Repeat this exercise 5 times. Do this 2 times a day or as told by your health care provider. 1. Stand with your arms at your sides. Look straight ahead. 2. Slowly pull both shoulders backward and downward until you feel a stretch between your shoulder blades in your upper back. 3. Hold  for 10-30 seconds. 4. Relax and repeat. Contact a health care provider if:  Your neck pain or discomfort gets much worse when you do an exercise.  Your neck pain or discomfort does not improve within 2 hours after you exercise. If you have any of these problems, stop exercising right away. Do not do the exercises again unless your health care provider says that you can. Get help right away if:  You develop sudden, severe neck pain. If this happens, stop exercising right away. Do not do the exercises again unless your health care provider says that you can. This information is not intended to replace advice given to you by your health care provider. Make sure you discuss any questions you have with your health care provider. Document Released: 02/18/2015 Document Revised: 07/13/2017 Document Reviewed: 09/17/2014 Elsevier Interactive Patient Education  2019 [employer].

## 2018-05-18 ENCOUNTER — Other Ambulatory Visit: Payer: Self-pay | Admitting: Cardiology

## 2018-05-18 ENCOUNTER — Other Ambulatory Visit: Payer: Self-pay | Admitting: Nurse Practitioner

## 2018-05-19 ENCOUNTER — Ambulatory Visit (INDEPENDENT_AMBULATORY_CARE_PROVIDER_SITE_OTHER): Payer: Medicare HMO | Admitting: Nurse Practitioner

## 2018-05-19 ENCOUNTER — Encounter: Payer: Self-pay | Admitting: Nurse Practitioner

## 2018-05-19 VITALS — BP 138/62 | HR 78 | Temp 98.8°F | Wt 288.0 lb

## 2018-05-19 DIAGNOSIS — J0191 Acute recurrent sinusitis, unspecified: Secondary | ICD-10-CM | POA: Diagnosis not present

## 2018-05-19 DIAGNOSIS — M542 Cervicalgia: Secondary | ICD-10-CM | POA: Diagnosis not present

## 2018-05-19 MED ORDER — AMOXICILLIN-POT CLAVULANATE 875-125 MG PO TABS: 1.0000 | ORAL_TABLET | Freq: Two times a day (BID) | ORAL | 0 refills | Status: DC

## 2018-05-19 NOTE — Progress Notes (Signed)
Craig Flynn is a 58 y.o. male with the following history as recorded in EpicCare:  Patient Active Problem List   Diagnosis Date Noted  . Follicular thyroid cancer ([place]) 03/26/2017  . Thyroid nodule 01/22/2017  . Ptosis 01/09/2016  . Chronic systolic heart failure ([place]) 08/01/2015  . GERD (gastroesophageal reflux disease) 07/29/2015  . Osteoarthritis 05/03/2015  . Hyperlipidemia 02/26/2015  . Diastolic dysfunction 16/12/9602  . Tobacco abuse 02/26/2015  . OSA (obstructive sleep apnea) 12/27/2014  . Routine general medical examination at a health care facility 11/12/2014  . Anemia in chronic kidney disease 11/12/2014  . Left knee pain 09/22/2014  . Systolic CHF ([place]) 54/11/8117  . Paresthesia of left leg 09/21/2014  . Acute systolic CHF (congestive heart failure) ([place]) 11/17/2013  . CKD (chronic kidney disease) stage 4, GFR 15-29 ml/min (HCC) 11/17/2013  . Morbid obesity ([place]) 11/17/2013  . Diabetes mellitus ([place]) 12/12/2011  . Acute on chronic renal failure ([place]) 12/11/2011  . Hypokalemia 12/11/2011  . HTN (hypertension) 12/11/2011  . Gout 12/31/2006  . Essential hypertension 12/31/2006  . HERNIATED LUMBAR DISK WITH RADICULOPATHY 12/30/2006    Current Outpatient Medications  Medication Sig Dispense Refill  . acetaminophen (TYLENOL) 500 MG tablet Take 2 tablets (1,000 mg total) by mouth every 8 (eight) hours as needed for mild pain or headache. 60 tablet 0  . allopurinol (ZYLOPRIM) 300 MG tablet TAKE 1 TABLET BY MOUTH DAILY 90 tablet 1  . aspirin EC 81 MG tablet Take 81 mg by mouth daily.    . calcitRIOL (ROCALTROL) 0.25 MCG capsule Take 1 capsule by mouth 3 (three) times a week.  5  . carvedilol (COREG) 25 MG tablet TAKE 1 TABLET BY MOUTH TWICE DAILY (Patient taking differently: Take 25 mg by mouth daily. ) 180 tablet 2  . fenofibrate 54 MG tablet TAKE 1 TABLET BY MOUTH ONCE DAILY 90 tablet 3  . fluticasone (FLONASE) 50 MCG/ACT nasal spray SHAKE LIQUID AND USE 2 SPRAYS IN EACH  NOSTRIL DAILY 48 g 1  . furosemide (LASIX) 80 MG tablet Take 1 tablet (80 mg total) by mouth 2 (two) times daily. (Patient taking differently: Take 160 mg by mouth 2 (two) times daily. Morning & afternoon) 60 tablet 1  . isosorbide mononitrate (IMDUR) 30 MG 24 hr tablet TAKE 2 TABLETS(60 MG) BY MOUTH DAILY (Patient taking differently: Take 30 mg by mouth 2 (two) times daily. ) 60 tablet 5  . nitroGLYCERIN (NITROSTAT) 0.4 MG SL tablet Place 1 tablet (0.4 mg total) under the tongue every 5 (five) minutes as needed for chest pain. 25 tablet 3  . pantoprazole (PROTONIX) 40 MG tablet TAKE 1 TABLET(40 MG) BY MOUTH DAILY 30 tablet 5  . potassium chloride SA (K-DUR,KLOR-CON) 20 MEQ tablet Take 20 mEq by mouth 2 (two) times daily. Morning & afternoon    . amoxicillin-clavulanate (AUGMENTIN) 875-125 MG tablet Take 1 tablet by mouth 2 (two) times daily. 14 tablet 0  . levothyroxine (SYNTHROID) 200 MCG tablet Take 1 tablet (200 mcg total) by mouth daily. 30 tablet 11   No current facility-administered medications for this visit.     Allergies: Patient has no known allergies.  Past Medical History:  Diagnosis Date  . Anemia 06/2015  . Arthritis    knee  . Cancer [facility])    thyroid cancer  . Cardiomyopathy ([place])    a. 10/2013: EF reduced to 35-40% b. 09/2014: EF improved to 55-60%, Grade 1 DD noted.  . Chest pain 06/2015  . CHF (  congestive heart failure) ([place])   . CKD (chronic kidney disease), stage IV [facility])    sees  Dr Lorrene Reid  . Dyspnea    with exerion   . GERD (gastroesophageal reflux disease)   . Gout   . Hypertension   . Obesity   . Sleep apnea    not wearing c-pap now, needs new slep study per pt. (01/22/2017)  . Tuberculosis 1981   while the pt was in the service.  . Tubular adenoma of colon 10/2015  . Wears glasses     Past Surgical History:  Procedure Laterality Date  . AV FISTULA PLACEMENT Left 03/25/2016   Procedure: Creation of Left Arm ARTERIOVENOUS (AV) FISTULA;  Surgeon: Elam Dutch, MD;  Location: [facility] OR;  Service: Vascular;  Laterality: Left;  . COLONOSCOPY W/ BIOPSIES AND POLYPECTOMY     "no problem"  . HERNIA REPAIR    . LAPAROSCOPIC CHOLECYSTECTOMY    . LIGATION OF COMPETING BRANCHES OF ARTERIOVENOUS FISTULA Left 01/25/2018   Procedure: LIGATION OF COMPETING BRANCHES And Revision of ARTERIOVENOUS FISTULA LEFT ARM.;  Surgeon: Elam Dutch, MD;  Location: [place];  Service: Vascular;  Laterality: Left;  . THYROIDECTOMY  01/22/2017  . THYROIDECTOMY Left 01/22/2017   Procedure: THYROIDECTOMY;  Surgeon: Melida Quitter, MD;  Location: [place];  Service: ENT;  Laterality: Left;  . THYROIDECTOMY Right 03/26/2017   Procedure: COMPLETION OF THYROIDECTOMY;  Surgeon: Melida Quitter, MD;  Location: [place];  Service: ENT;  Laterality: Right;  . UMBILICAL HERNIA REPAIR    . WISDOM TOOTH EXTRACTION      Family History  Problem Relation Age of Onset  . Stroke Mother   . Pneumonia Father        died of Pneumonia x3  . Kidney disease Maternal Grandmother   . Hypertension Maternal Grandmother   . Healthy Maternal Grandfather   . Healthy Paternal Grandmother   . Healthy Paternal Grandfather   . CAD Neg Hx   . Colon cancer Neg Hx   . Esophageal cancer Neg Hx   . Pancreatic cancer Neg Hx   . Prostate cancer Neg Hx   . Stomach cancer Neg Hx   . Rectal cancer Neg Hx     Social History   Tobacco Use  . Smoking status: Current Every Day Smoker    Packs/day: 0.50    Years: 40.00    Pack years: 20.00    Types: Cigarettes  . Smokeless tobacco: Never Used  Substance Use Topics  . Alcohol use: Not Currently    Alcohol/week: 0.0 standard drinks     Subjective:   Mr Autumn is here today requesting follow up of neck pain. I saw him on 2/13 for posterior neck pain, which had persisted after prednisone pack given at an UC visit for same complaint. At 2/13 OV, He was worried he had sinus infection but had no other signs of a sinus infection. A neck xray was done which showed no  acute abnormalities, but did show degenerative changes and he was referred to [facility] for further evaluation. He is back today, tells me that he's still having neck pain along with now some facial pressure and increased nasal drainage, "blowing out yellow snot.' he again says he is worried he has a sinus infection, as his sinus infections in the past have felt similar. He called his ENT provider for follow up but his appointment is not until April. He did not schedule [facility] follow up, as he feels his  symptoms are due to sinus infection not a neck problem  Denies fevers, chills, weakness, syncope, confusion, ear pain, vision changes, sore throat, cough, cp, sob  ROS- See HPI  Objective:  Vitals:   05/19/18 1356  BP: 138/62  Pulse: 78  Temp: 98.8 F (37.1 C)  TempSrc: Oral  SpO2: 96%  Weight: 288 lb (130.6 kg)    General: Well developed, well nourished, in no acute distress  Skin : Warm and dry.  Head: Normocephalic and atraumatic  Eyes: Sclera and conjunctiva clear; pupils round and reactive to light; extraocular movements intact  Ears: External normal; canals clear; tympanic membranes normal  Nose: Frontal sinus tenderness. Oropharynx: Pink, supple. No suspicious lesions  Neck: Supple without thyromegaly, adenopathy  Lungs: Respirations unlabored; clear to auscultation bilaterally without wheeze, rales, rhonchi  CVS exam: normal rate and regular rhythm, S1 and S2 normal.  Musculoskeletal: No deformities; no active joint inflammation, normal ROM Extremities: No edema, cyanosis, clubbing  Vessels: Symmetric bilaterally  Neurologic: Alert and oriented; speech intact; face symmetrical; moves all extremities well; CNII-XII intact without focal deficit  Psychiatric: Normal mood and affect.  Assessment:  1. Acute recurrent sinusitis, unspecified location   2. Neck pain     Plan:   Now with some sinus symptoms, I am not convinced he needs an antibiotic at this point but he is persistently  asking for treatment for a sinus infection We discussed antibiotic stewardship, he'd still like to proceed with abx course Augmentin sent - dosing, side effects discussed Home management, red flags and return precautions including when to seek immediate care discussed and printed on AVS He will keep planned follow up with ENT I also recommended  F/U with [facility] if neck pain persists after antibiotic course

## 2018-05-19 NOTE — Patient Instructions (Signed)
Complete augmentin as prescribed  Keep planned ENT follow up  If your neck is still hurting after this, I would recommend seeing sports medicine as we discussed   Muscle Pain, Adult Muscle pain (myalgia) may be mild or severe. In most cases, the pain lasts only a short time and it goes away without treatment. It is normal to feel some muscle pain after starting a workout program. Muscles that have not been used often will be sore at first. Muscle pain may also be caused by many other things, including:  Overuse or muscle strain, especially if you are not in shape. This is the most common cause of muscle pain.  Injury.  Bruises.  Viruses, such as the flu.  Infectious diseases.  A chronic condition that causes muscle tenderness, fatigue, and headache (fibromyalgia).  A condition, such as lupus, in which the body's disease-fighting system attacks other organs in the body (autoimmune or rheumatologic diseases).  Certain drugs, including ACE inhibitors and statins. To diagnose the cause of your muscle pain, your health care provider will do a physical exam and ask questions about the pain and when it began. If you have not had muscle pain for very long, your health care provider may want to wait before doing much testing. If your muscle pain has lasted a long time, your health care provider may want to run tests right away. In some cases, this may include tests to rule out certain conditions or illnesses. Treatment for muscle pain depends on the cause. Home care is often enough to relieve muscle pain. Your health care provider may also prescribe anti-inflammatory medicine. Follow these instructions at home: Activity  If overuse is causing your muscle pain: ? Slow down your activities until the pain goes away. ? Do regular, gentle exercises if you are not usually active. ? Warm up before exercising. Stretch before and after exercising. This can help lower the risk of muscle pain.  Do  not continue working out if the pain is very bad. Bad pain could mean that you have injured a muscle. Managing pain and discomfort   If directed, apply ice to the sore muscle: ? Put ice in a plastic bag. ? Place a towel between your skin and the bag. ? Leave the ice on for 20 minutes, 2-3 times a day.  You may also alternate between applying ice and applying heat as told by your health care provider. To apply heat, use the heat source that your health care provider recommends, such as a moist heat pack or a heating pad. ? Place a towel between your skin and the heat source. ? Leave the heat on for 20-30 minutes. ? Remove the heat if your skin turns bright red. This is especially important if you are unable to feel pain, heat, or cold. You may have a greater risk of getting burned. Medicines  Take over-the-counter and prescription medicines only as told by your health care provider.  Do not drive or use heavy machinery while taking prescription pain medicine. Contact a health care provider if:  Your muscle pain gets worse and medicines do not help.  You have muscle pain that lasts longer than 3 days.  You have a rash or fever along with muscle pain.  You have muscle pain after a tick bite.  You have muscle pain while working out, even though you are in good physical condition.  You have redness, soreness, or swelling along with muscle pain.  You have muscle pain after  starting a new medicine or changing the dose of a medicine. Get help right away if:  You have trouble breathing.  You have trouble swallowing.  You have muscle pain along with a stiff neck, fever, and vomiting.  You have severe muscle weakness or cannot move part of your body. This information is not intended to replace advice given to you by your health care provider. Make sure you discuss any questions you have with your health care provider. Document Released: 01/29/2006 Document Revised: 09/27/2015 Document  Reviewed: 07/30/2015 Elsevier Interactive Patient Education  2019 [employer].

## 2018-05-24 DIAGNOSIS — N189 Chronic kidney disease, unspecified: Secondary | ICD-10-CM | POA: Diagnosis not present

## 2018-05-24 DIAGNOSIS — I10 Essential (primary) hypertension: Secondary | ICD-10-CM | POA: Diagnosis not present

## 2018-05-24 DIAGNOSIS — E890 Postprocedural hypothyroidism: Secondary | ICD-10-CM | POA: Diagnosis not present

## 2018-05-24 DIAGNOSIS — C73 Malignant neoplasm of thyroid gland: Secondary | ICD-10-CM | POA: Diagnosis not present

## 2018-05-25 ENCOUNTER — Other Ambulatory Visit (HOSPITAL_COMMUNITY): Payer: Self-pay | Admitting: Endocrinology

## 2018-05-25 DIAGNOSIS — C73 Malignant neoplasm of thyroid gland: Secondary | ICD-10-CM

## 2018-06-06 ENCOUNTER — Encounter (HOSPITAL_COMMUNITY): Payer: Medicare HMO

## 2018-06-07 ENCOUNTER — Encounter (HOSPITAL_COMMUNITY): Payer: Medicare HMO

## 2018-06-08 ENCOUNTER — Encounter (HOSPITAL_COMMUNITY): Payer: Medicare HMO

## 2018-06-10 ENCOUNTER — Other Ambulatory Visit (HOSPITAL_COMMUNITY): Payer: Medicare HMO

## 2018-06-13 ENCOUNTER — Ambulatory Visit (HOSPITAL_COMMUNITY)
Admission: EM | Admit: 2018-06-13 | Discharge: 2018-06-13 | Disposition: A | Discharge status: Home / Self Care | Payer: Medicare HMO | Attending: Family Medicine | Admitting: Family Medicine

## 2018-06-13 ENCOUNTER — Encounter (HOSPITAL_COMMUNITY): Payer: Self-pay

## 2018-06-13 ENCOUNTER — Other Ambulatory Visit: Payer: Self-pay

## 2018-06-13 DIAGNOSIS — S161XXA Strain of muscle, fascia and tendon at neck level, initial encounter: Secondary | ICD-10-CM

## 2018-06-13 DIAGNOSIS — M545 Low back pain, unspecified: Secondary | ICD-10-CM
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
## 2018-06-13 NOTE — Discharge Instructions (Signed)
Take Tylenol for pain Use ice or heat Activity as tolerated Return if you fail improve over next several days
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
## 2018-06-15 DIAGNOSIS — M545 Low back pain, unspecified: Secondary | ICD-10-CM | POA: Insufficient documentation

## 2018-06-15 DIAGNOSIS — M542 Cervicalgia: Secondary | ICD-10-CM | POA: Insufficient documentation
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
## 2018-06-16 ENCOUNTER — Telehealth: Payer: Self-pay

## 2018-06-16 ENCOUNTER — Ambulatory Visit (INDEPENDENT_AMBULATORY_CARE_PROVIDER_SITE_OTHER): Payer: Medicare HMO | Admitting: Internal Medicine

## 2018-06-16 ENCOUNTER — Encounter: Payer: Self-pay | Admitting: Internal Medicine

## 2018-06-16 DIAGNOSIS — M545 Low back pain, unspecified: Secondary | ICD-10-CM

## 2018-06-16 DIAGNOSIS — M542 Cervicalgia: Secondary | ICD-10-CM | POA: Diagnosis not present

## 2018-06-16 DIAGNOSIS — G44301 Post-traumatic headache, unspecified, intractable: Secondary | ICD-10-CM | POA: Diagnosis not present

## 2018-06-16 MED ORDER — PREDNISONE 10 MG PO TABS: ORAL_TABLET | ORAL | 0 refills | Status: DC
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
## 2018-06-16 NOTE — Telephone Encounter (Signed)
Left message for patient to call back to be schedule with Dr. Tamala Julian. Dr. Quay Burow referral.

## 2018-06-16 NOTE — Telephone Encounter (Signed)
Patient scheduled.
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
## 2018-06-19 NOTE — Progress Notes (Deleted)
[name] [address], [name] Phone: [phone] Subjective:    I'm seeing this patient by the request  of: Burns MD  Lance Sell, NP   CC: Neck pain  YWV:[id number]  Craig Flynn is a 58 y.o. male coming in with complaint of ***  Onset-  Location Duration-  Character- Aggravating factors- Reliving factors-  Therapies tried-  Severity-     Past Medical History:  Diagnosis Date   Anemia 06/2015   Arthritis    knee   Cancer ([place])    thyroid cancer   Cardiomyopathy ([place])    a. 10/2013: EF reduced to 35-40% b. 09/2014: EF improved to 55-60%, Grade 1 DD noted.   Chest pain 06/2015   CHF (congestive heart failure) (HCC)    CKD (chronic kidney disease), stage IV [facility])    sees  Dr Lorrene Reid   Dyspnea    with exerion    GERD (gastroesophageal reflux disease)    Gout    Hypertension    Obesity    Sleep apnea    not wearing c-pap now, needs new slep study per pt. (01/22/2017)   Tuberculosis 1981   while the pt was in the service.   Tubular adenoma of colon 10/2015   Wears glasses    Past Surgical History:  Procedure Laterality Date   AV FISTULA PLACEMENT Left 03/25/2016   Procedure: Creation of Left Arm ARTERIOVENOUS (AV) FISTULA;  Surgeon: Elam Dutch, MD;  Location: [place] [facility];  Service: Vascular;  Laterality: Left;   COLONOSCOPY W/ BIOPSIES AND POLYPECTOMY     "no problem"   HERNIA REPAIR     LAPAROSCOPIC CHOLECYSTECTOMY     LIGATION OF COMPETING BRANCHES OF ARTERIOVENOUS FISTULA Left 01/25/2018   Procedure: LIGATION OF COMPETING BRANCHES And Revision of ARTERIOVENOUS FISTULA LEFT ARM.;  Surgeon: Elam Dutch, MD;  Location: [place];  Service: Vascular;  Laterality: Left;   THYROIDECTOMY  01/22/2017   THYROIDECTOMY Left 01/22/2017   Procedure: THYROIDECTOMY;  Surgeon: Melida Quitter, MD;  Location: [place];  Service: ENT;  Laterality: Left;   THYROIDECTOMY Right 03/26/2017   Procedure:  COMPLETION OF THYROIDECTOMY;  Surgeon: Melida Quitter, MD;  Location: [place];  Service: ENT;  Laterality: Right;   UMBILICAL HERNIA REPAIR     WISDOM TOOTH EXTRACTION     Social History   Socioeconomic History   Marital status: Divorced    Spouse name: Not on file   Number of children: 2   Years of education: 12   Highest education level: Not on file  Occupational History   Occupation: Disability    Comment: Knees   Social Designer, fashion/clothing strain: Not on file   Food insecurity:    Worry: Not on file    Inability: Not on file   Transportation needs:    Medical: Not on file    Non-medical: Not on file  Tobacco Use   Smoking status: Current Every Day Smoker    Packs/day: 0.50    Years: 40.00    Pack years: 20.00    Types: Cigarettes   Smokeless tobacco: Never Used  Substance and Sexual Activity   Alcohol use: Not Currently    Alcohol/week: 0.0 standard drinks   Drug use: No   Sexual activity: Yes  Lifestyle   Physical activity:    Days per week: Not on file    Minutes per session: Not on file   Stress: Not  on file  Relationships   Social connections:    Talks on phone: Not on file    Gets together: Not on file    Attends religious service: Not on file    Active member of club or organization: Not on file    Attends meetings of clubs or organizations: Not on file    Relationship status: Not on file  Other Topics Concern   Not on file  Social History Narrative   Fun: Golf, basketball    Denies religious beliefs effecting health care.    No Known Allergies Family History  Problem Relation Age of Onset   Stroke Mother    Pneumonia Father        died of Pneumonia x3   Kidney disease Maternal Grandmother    Hypertension Maternal Grandmother    Healthy Maternal Grandfather    Healthy Paternal Grandmother    Healthy Paternal Grandfather    CAD Neg Hx    Colon cancer Neg Hx    Esophageal cancer Neg Hx    Pancreatic cancer  Neg Hx    Prostate cancer Neg Hx    Stomach cancer Neg Hx    Rectal cancer Neg Hx     Current Outpatient Medications (Endocrine & Metabolic):    calcitRIOL (ROCALTROL) 0.25 MCG capsule, Take 1 capsule by mouth 3 (three) times a week.   levothyroxine (SYNTHROID) 200 MCG tablet, Take 1 tablet (200 mcg total) by mouth daily.   predniSONE (DELTASONE) 10 MG tablet, Take 4 tabs po qd x 3 days, then 3 tabs po qd x 3 days, then 2 tabs po qd x 3 days, then 1 tab po qd x 3 days  Current Outpatient Medications (Cardiovascular):    carvedilol (COREG) 25 MG tablet, TAKE 1 TABLET BY MOUTH TWICE DAILY (Patient taking differently: Take 25 mg by mouth daily. )   fenofibrate 54 MG tablet, TAKE 1 TABLET BY MOUTH ONCE DAILY   furosemide (LASIX) 80 MG tablet, Take 1 tablet (80 mg total) by mouth 2 (two) times daily. (Patient taking differently: Take 160 mg by mouth 2 (two) times daily. Morning & afternoon)   isosorbide mononitrate (IMDUR) 30 MG 24 hr tablet, TAKE 2 TABLETS(60 MG) BY MOUTH DAILY (Patient taking differently: Take 30 mg by mouth 2 (two) times daily. )   nitroGLYCERIN (NITROSTAT) 0.4 MG SL tablet, Place 1 tablet (0.4 mg total) under the tongue every 5 (five) minutes as needed for chest pain.   Current Outpatient Medications (Analgesics):    acetaminophen (TYLENOL) 500 MG tablet, Take 2 tablets (1,000 mg total) by mouth every 8 (eight) hours as needed for mild pain or headache.   allopurinol (ZYLOPRIM) 300 MG tablet, TAKE 1 TABLET BY MOUTH DAILY   aspirin EC 81 MG tablet, Take 81 mg by mouth daily.   Current Outpatient Medications (Other):    pantoprazole (PROTONIX) 40 MG tablet, TAKE 1 TABLET(40 MG) BY MOUTH DAILY   potassium chloride SA (K-DUR,KLOR-CON) 20 MEQ tablet, Take 20 mEq by mouth 2 (two) times daily. Morning & afternoon    Past medical history, social, surgical and family history all reviewed in electronic medical record.  No pertanent information unless stated  regarding to the chief complaint.   Review of Systems:  No headache, visual changes, nausea, vomiting, diarrhea, constipation, dizziness, abdominal pain, skin rash, fevers, chills, night sweats, weight loss, swollen lymph nodes, body aches, joint swelling, muscle aches, chest pain, shortness of breath, mood changes.   Objective  There were no  vitals taken for this visit.     General: No apparent distress alert and oriented x3 mood and affect normal, dressed appropriately.  HEENT: Pupils equal, extraocular movements intact  Respiratory: Patient's speak in full sentences and does not appear short of breath  Cardiovascular: No lower extremity edema, non tender, no erythema  Skin: Warm dry intact with no signs of infection or rash on extremities or on axial skeleton.  Abdomen: Soft nontender  Neuro: Cranial nerves II through XII are intact, neurovascularly intact in all extremities with 2+ DTRs and 2+ pulses.  Lymph: No lymphadenopathy of posterior or anterior cervical chain or axillae bilaterally.  Gait normal with good balance and coordination.  MSK:  Non tender with full range of motion and good stability and symmetric strength and tone of shoulders, elbows, wrist, hip, knee and ankles bilaterally.  Neck: Inspection unremarkable. No palpable stepoffs. Negative Spurling's maneuver. Full neck range of motion Grip strength and sensation normal in bilateral hands Strength good C4 to T1 distribution No sensory change to C4 to T1 Negative Hoffman sign bilaterally Reflexes normal   Impression and Recommendations:     This case required medical decision making of moderate complexity. The above documentation has been reviewed and is accurate and complete Lyndal Pulley, DO       Note: This dictation was prepared with Dragon dictation along with smaller phrase technology. Any transcriptional errors that result from this process are unintentional.

## 2018-06-20 ENCOUNTER — Ambulatory Visit: Payer: Medicare HMO | Admitting: Family Medicine

## 2018-06-20 ENCOUNTER — Encounter (HOSPITAL_COMMUNITY): Admission: RE | Admit: 2018-06-20 | Payer: Medicare HMO | Source: Ambulatory Visit

## 2018-06-21 ENCOUNTER — Encounter (HOSPITAL_COMMUNITY): Payer: Medicare HMO | Attending: Endocrinology

## 2018-06-22 ENCOUNTER — Encounter (HOSPITAL_COMMUNITY): Payer: Medicare HMO

## 2018-06-24 ENCOUNTER — Encounter (HOSPITAL_COMMUNITY): Payer: Medicare HMO

## 2018-07-01 ENCOUNTER — Telehealth: Payer: Self-pay | Admitting: Cardiology

## 2018-07-01 NOTE — Telephone Encounter (Signed)
Smart phone/MyChart/pre reg complete. 07-01-18 ST
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
## 2018-07-04 ENCOUNTER — Telehealth (INDEPENDENT_AMBULATORY_CARE_PROVIDER_SITE_OTHER): Payer: Medicare HMO | Admitting: Cardiology

## 2018-07-04 ENCOUNTER — Encounter: Payer: Self-pay | Admitting: Cardiology

## 2018-07-04 VITALS — BP 135/85 | HR 78 | Ht 74.0 in | Wt 290.0 lb

## 2018-07-04 DIAGNOSIS — I5032 Chronic diastolic (congestive) heart failure: Secondary | ICD-10-CM

## 2018-07-04 DIAGNOSIS — I10 Essential (primary) hypertension: Secondary | ICD-10-CM

## 2018-07-04 DIAGNOSIS — R0789 Other chest pain: Secondary | ICD-10-CM

## 2018-07-04 NOTE — Patient Instructions (Signed)
Medication Instructions:  Continue current medications  If you need a refill on your cardiac medications before your next appointment, please call your pharmacy.  Labwork: None Ordered   Testing/Procedures: None Ordered   Follow-Up: You will need a follow up appointment in 1 Year.  Please call our office 2 months in advance to schedule this appointment.  You may see Corea Hochrein, MD or one of the following Advanced Practice Providers on your designated Care Team:   Rhonda Barrett, PA-C . Kathryn Lawrence, DNP, ANP      At [facility], you and your health needs are our priority.  As part of our continuing mission to provide you with exceptional heart care, we have created designated Provider Care Teams.  These Care Teams include your primary Cardiologist (physician) and Advanced Practice Providers (APPs -  Physician Assistants and Nurse Practitioners) who all work together to provide you with the care you need, when you need it.  Thank you for choosing [facility] at [facility]!!     

## 2018-07-11 ENCOUNTER — Encounter: Payer: Self-pay | Admitting: Family Medicine

## 2018-07-11 ENCOUNTER — Ambulatory Visit (INDEPENDENT_AMBULATORY_CARE_PROVIDER_SITE_OTHER): Payer: Medicare HMO | Admitting: Family Medicine

## 2018-07-11 DIAGNOSIS — M542 Cervicalgia: Secondary | ICD-10-CM
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
## 2018-07-12 DIAGNOSIS — N189 Chronic kidney disease, unspecified: Secondary | ICD-10-CM | POA: Diagnosis not present

## 2018-07-12 DIAGNOSIS — E872 Acidosis: Secondary | ICD-10-CM | POA: Diagnosis not present

## 2018-07-12 DIAGNOSIS — E669 Obesity, unspecified: Secondary | ICD-10-CM | POA: Diagnosis not present

## 2018-07-12 DIAGNOSIS — M109 Gout, unspecified: Secondary | ICD-10-CM | POA: Diagnosis not present

## 2018-07-12 DIAGNOSIS — I770 Arteriovenous fistula, acquired: Secondary | ICD-10-CM | POA: Diagnosis not present

## 2018-07-12 DIAGNOSIS — N185 Chronic kidney disease, stage 5: Secondary | ICD-10-CM | POA: Diagnosis not present

## 2018-07-12 DIAGNOSIS — D631 Anemia in chronic kidney disease: Secondary | ICD-10-CM | POA: Diagnosis not present

## 2018-07-12 DIAGNOSIS — N2581 Secondary hyperparathyroidism of renal origin: Secondary | ICD-10-CM | POA: Diagnosis not present

## 2018-07-12 DIAGNOSIS — E876 Hypokalemia: Secondary | ICD-10-CM | POA: Diagnosis not present

## 2018-07-12 DIAGNOSIS — I120 Hypertensive chronic kidney disease with stage 5 chronic kidney disease or end stage renal disease: Secondary | ICD-10-CM | POA: Diagnosis not present

## 2018-07-21 ENCOUNTER — Ambulatory Visit (INDEPENDENT_AMBULATORY_CARE_PROVIDER_SITE_OTHER): Payer: Medicare HMO | Admitting: Internal Medicine

## 2018-07-21 ENCOUNTER — Encounter: Payer: Self-pay | Admitting: Internal Medicine

## 2018-07-21 DIAGNOSIS — R0602 Shortness of breath: Secondary | ICD-10-CM | POA: Diagnosis not present

## 2018-07-21 NOTE — Assessment & Plan Note (Signed)
Suspect could be related to abdominal girth. He is asked to monitor weights and if increasing to up lasix to 8 mg TID for 3 days and then return to lasix 80 mg BID. If weights are stable he is asked to monitor symptoms and call for any worsening. No coronavirus symptoms. Reminded that he is high risk for complications and to maintain social distancing and leave home as little as possible currently.

## 2018-07-21 NOTE — Progress Notes (Signed)
Virtual Visit via Video Note  I connected with Craig Flynn on 07/21/18 at  8:40 AM EDT by a video enabled telemedicine application and verified that I am speaking with the correct person using two identifiers.   I discussed the limitations of evaluation and management by telemedicine and the availability of in person appointments. The patient expressed understanding and agreed to proceed.  History of Present Illness: The patient is a 58 y.o. man with visit for SOB. Started 2-3 days ago. He rates this as mild. Some mild chest pressure along with it but he has that already and that is not different from usual. He does have slightly more abdomen swelling. Denies significant leg swelling or weight gain. Has a scale at home. Still taking lasix BID and still urinating well with that. Denies change in chronic cough. Denies fevers or chills. Denies headaches. Is practicing social distancing and has not left the house in some weeks. Has concurrent diastolic CHF and CKD stage 5. Denies missing medications. Overall it is stable since onset. Has tried nothing. Recent visit with cardiology and with nephrology and labs stable as of 07/12/18 reviewed with patient during visit.  Observations/Objective: Appearance: normal, breathing appears normal, not coughing at all through the visit, casual grooming, abdomen does appear distended, throat normal, mental status is A and O times 3  Assessment and Plan: See problem oriented charting  Follow Up Instructions: monitor weights and symptoms over the next 3-4 days, if weight increasing take lasix 80 mg TID for 2-3 days and then resume BID dosing, if not improving call for advice  I discussed the assessment and treatment plan with the patient. The patient was provided an opportunity to ask questions and all were answered. The patient agreed with the plan and demonstrated an understanding of the instructions.   The patient was advised to call back or seek an in-person  evaluation if the symptoms worsen or if the condition fails to improve as anticipated.  Hoyt Koch, MD

## 2018-07-26 DIAGNOSIS — H5213 Myopia, bilateral: Secondary | ICD-10-CM | POA: Diagnosis not present

## 2018-07-26 DIAGNOSIS — Z0100 Encounter for examination of eyes and vision without abnormal findings: Secondary | ICD-10-CM | POA: Diagnosis not present

## 2018-08-12 ENCOUNTER — Telehealth: Payer: Self-pay | Admitting: Cardiology

## 2018-08-12 NOTE — Telephone Encounter (Signed)
New Message   Pt c/o Syncope: STAT if syncope occurred within 30 minutes and pt complains of lightheadedness High Priority if episode of passing out, completely, today or in last 24 hours   1. Did you pass out today? yes  When is the last time you passed out?  About 5-10 minutes ago 2. Has this occurred multiple times? yes  3. Did you have any symptoms prior to passing out? He explained it as if his heart stops and he passes out quickly and then come back.
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
## 2018-08-16 ENCOUNTER — Other Ambulatory Visit: Payer: Self-pay | Admitting: Internal Medicine

## 2018-08-16 ENCOUNTER — Encounter: Payer: BLUE CROSS/BLUE SHIELD | Admitting: Nurse Practitioner

## 2018-08-18 ENCOUNTER — Encounter: Payer: Medicare HMO | Admitting: Nurse Practitioner

## 2018-08-25 ENCOUNTER — Telehealth: Payer: Self-pay | Admitting: Internal Medicine

## 2018-08-25 NOTE — Telephone Encounter (Signed)
Tried calling patient and phone rang and rang, no VM set up.

## 2018-08-25 NOTE — Telephone Encounter (Signed)
-----   Message from Biagio Borg, MD sent at 07/24/2018 11:05 AM EDT ----- Regarding: transfer of care Craig Flynn or other Scheduler,  Pt due now for CPX  Please contact this patient, and ask them to schedule with me as new PCP asap (even same day if possible) when you are able to address this, in order for the patient to continue have updated ongoing care without being lost in the system, and to keep up a high standard of care at [facility].    These visits can be In Person (if no fever symptoms), or Virtual.  I would prefer in person during day hours at the office, but either is ok.  Exceptions can be made to scheduling of currently ill patients in person who happen to have a current issue such as feeling feverish and UTI symptoms or other infection (just not anything related to possible COVID19 symptoms)  Please be aware I would like 4 Doxy (virtual) visits if possible each Mon/Tues/Wed/Thur evenings that I can do from home.  The template has been changed to reflect the ability to do this, and I will just need to be aware to do this by watching my schedule.

## 2018-08-29 ENCOUNTER — Telehealth: Payer: Self-pay | Admitting: Internal Medicine

## 2018-08-29 NOTE — Telephone Encounter (Signed)
Copied from [place] [id number]. Topic: Quick Communication - Rx Refill/Question >> Aug 29, 2018  6:08 PM Waylan Rocher, Lumin L wrote: Medication: isosorbide mononitrate (IMDUR) 30 MG 24 hr tablet   Has the patient contacted their pharmacy? {yes (Agent: If no, request that the patient contact the pharmacy for the refill.) (Agent: If yes, when and what did the pharmacy advise?) they requested a few days ago and have not heard back  Preferred Pharmacy (with phone number or street name): [phone] - [address], [address] [place] [address] Phone: [phone] Fax: [phone] Not a 24 hour pharmacy; exact hours not   Agent: Please be advised that RX refills may take up to 3 business days. We ask that you follow-up with your pharmacy.

## 2018-08-30 MED ORDER — ISOSORBIDE MONONITRATE ER 30 MG PO TB24: ORAL_TABLET | ORAL | 0 refills | Status: DC

## 2018-08-30 NOTE — Telephone Encounter (Signed)
Tired to call patient. No answer and unable to leave a message.

## 2018-08-30 NOTE — Telephone Encounter (Signed)
Refill has been sent in but pt needs a TOC OV for additional refills.

## 2018-08-31 NOTE — Telephone Encounter (Signed)
Please call back to set up appt.  Pt returned call

## 2018-09-01 NOTE — Telephone Encounter (Signed)
Spoke with Brooke Bonito and he will have patient call back to schedule

## 2018-09-14 DIAGNOSIS — E876 Hypokalemia: Secondary | ICD-10-CM | POA: Diagnosis not present

## 2018-09-14 DIAGNOSIS — I770 Arteriovenous fistula, acquired: Secondary | ICD-10-CM | POA: Diagnosis not present

## 2018-09-14 DIAGNOSIS — N189 Chronic kidney disease, unspecified: Secondary | ICD-10-CM | POA: Diagnosis not present

## 2018-09-14 DIAGNOSIS — E872 Acidosis: Secondary | ICD-10-CM | POA: Diagnosis not present

## 2018-09-14 DIAGNOSIS — I120 Hypertensive chronic kidney disease with stage 5 chronic kidney disease or end stage renal disease: Secondary | ICD-10-CM | POA: Diagnosis not present

## 2018-09-14 DIAGNOSIS — N185 Chronic kidney disease, stage 5: Secondary | ICD-10-CM | POA: Diagnosis not present

## 2018-09-14 DIAGNOSIS — E669 Obesity, unspecified: Secondary | ICD-10-CM | POA: Diagnosis not present

## 2018-09-14 DIAGNOSIS — D631 Anemia in chronic kidney disease: Secondary | ICD-10-CM | POA: Diagnosis not present

## 2018-09-14 DIAGNOSIS — N2581 Secondary hyperparathyroidism of renal origin: Secondary | ICD-10-CM | POA: Diagnosis not present

## 2018-09-14 DIAGNOSIS — M109 Gout, unspecified: Secondary | ICD-10-CM | POA: Diagnosis not present

## 2018-09-21 ENCOUNTER — Ambulatory Visit (INDEPENDENT_AMBULATORY_CARE_PROVIDER_SITE_OTHER): Payer: Medicare HMO | Admitting: Family Medicine

## 2018-09-21 ENCOUNTER — Encounter: Payer: Self-pay | Admitting: Family Medicine

## 2018-09-21 ENCOUNTER — Other Ambulatory Visit: Payer: Self-pay

## 2018-09-21 VITALS — BP 138/60 | HR 79 | Ht 74.0 in | Wt 318.0 lb

## 2018-09-21 DIAGNOSIS — S134XXA Sprain of ligaments of cervical spine, initial encounter: Secondary | ICD-10-CM

## 2018-09-21 DIAGNOSIS — M1711 Unilateral primary osteoarthritis, right knee: Secondary | ICD-10-CM

## 2018-09-21 DIAGNOSIS — S134XXS Sprain of ligaments of cervical spine, sequela: Secondary | ICD-10-CM
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
## 2018-09-21 NOTE — Patient Instructions (Signed)
PT will call you Pennsaid small amount over the most painful spot Continue with plan See me again in 3 weeks

## 2018-09-21 NOTE — Assessment & Plan Note (Signed)
Mild overall the patient is about 85% better.  Discussed with patient in great length.  Topical anti-inflammatories given.  Will have patient go to formal physical therapy 1 or 2 times for further evaluation.  At that point as long as patient is well can follow-up with me again in 3 weeks and hopefully we will need maximal medical improvements
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
## 2018-10-04 ENCOUNTER — Other Ambulatory Visit: Payer: Self-pay

## 2018-10-04 ENCOUNTER — Encounter: Payer: Self-pay | Admitting: Physical Therapy

## 2018-10-04 ENCOUNTER — Ambulatory Visit: Payer: Medicare HMO | Attending: Family Medicine | Admitting: Physical Therapy

## 2018-10-04 DIAGNOSIS — K219 Gastro-esophageal reflux disease without esophagitis: Secondary | ICD-10-CM | POA: Diagnosis not present

## 2018-10-04 DIAGNOSIS — M542 Cervicalgia: Secondary | ICD-10-CM | POA: Insufficient documentation

## 2018-10-04 DIAGNOSIS — C73 Malignant neoplasm of thyroid gland: Secondary | ICD-10-CM | POA: Diagnosis not present

## 2018-10-04 DIAGNOSIS — R0982 Postnasal drip: Secondary | ICD-10-CM | POA: Insufficient documentation

## 2018-10-04 NOTE — Patient Instructions (Signed)
Step 1  Step 2  Seated Scapular Retraction reps: 10  sets: 2  hold: 30  daily: 2  weekly: 7 Setup  Begin sitting in an upright position. Movement  Gently squeeze your shoulder blades together, relax, and then repeat. Tip  Make sure to maintain good posture during the exercise. Step 1  Step 2  Seated Cervical Sidebending Stretch reps: 3  sets: 1  hold: 30  daily: 1  weekly: 7 Setup  Begin sitting in an upright position. Movement  Use one hand to tilt your head sideways, pulling your ear toward one shoulder until you feel a stretch in the opposite side of your neck, and hold. Tip  Make sure to keep your back straight and do not let your head rotate, or bend forward or backward. Step 1  Step 2  Seated Cervical Rotation AROM reps: 3  sets: 1  hold: 15  daily: 1  weekly: 7 Setup  Begin sitting in an upright position. Movement  Turn your head to look over one shoulder, then return to the starting position and repeat to the other side. Tip  Make sure keep your back straight and do not bend your head forward, backward, or sideways. Step 1  Step 2  Standing Row with Anchored Resistance reps: 10  sets: 2  hold: 5  daily: 2  weekly: 7 Setup  Begin in a standing upright position holding both ends of a resistance band that is anchored in front of you at chest height. Movement  Pull your arms back against the resistance, bending your elbows, then slowly return to the starting position and repeat. Tip  Make sure to keep your back straight and think of squeezing your shoulder blades together as you pull your arms back.
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
## 2018-10-10 ENCOUNTER — Telehealth: Payer: Self-pay | Admitting: Cardiology

## 2018-10-10 NOTE — Telephone Encounter (Signed)
°*  STAT* If patient is at the pharmacy, call can be transferred to refill team.   1. Which medications need to be refilled? (please list name of each medication and dose if known)  isosorbide mononitrate (IMDUR) 30 MG 24 hr tablet  2. Which pharmacy/location (including street and city if local pharmacy) is medication to be sent to? [facility] [phone] - [place], [place] AT [address]  3. Do they need a 30 day or 90 day supply? 30  Patient states the pharmacy has tried to reach out to Dr. Percival Spanish for a refill, but the pharmacy has not heard anything from the office.   The patient received a short term supply from his pharmacy, but that has run out

## 2018-10-11 ENCOUNTER — Other Ambulatory Visit: Payer: Self-pay | Admitting: *Deleted

## 2018-10-11 MED ORDER — ISOSORBIDE MONONITRATE ER 30 MG PO TB24: ORAL_TABLET | ORAL | 5 refills | Status: DC

## 2018-10-11 NOTE — Telephone Encounter (Signed)
Requested Prescriptions   Signed Prescriptions Disp Refills  . isosorbide mononitrate (IMDUR) 30 MG 24 hr tablet 60 tablet 5    Sig: TAKE 2 TABLETS(60 MG) BY MOUTH DAILY    Authorizing Provider: Minus Breeding    Ordering User: Britt Bottom

## 2018-10-13 ENCOUNTER — Ambulatory Visit (INDEPENDENT_AMBULATORY_CARE_PROVIDER_SITE_OTHER): Payer: Medicare HMO | Admitting: Family Medicine

## 2018-10-13 ENCOUNTER — Encounter: Payer: Self-pay | Admitting: Family Medicine

## 2018-10-13 ENCOUNTER — Other Ambulatory Visit: Payer: Self-pay

## 2018-10-13 DIAGNOSIS — S134XXA Sprain of ligaments of cervical spine, initial encounter: Secondary | ICD-10-CM

## 2018-10-13 DIAGNOSIS — M1711 Unilateral primary osteoarthritis, right knee: Secondary | ICD-10-CM
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
## 2018-10-13 NOTE — Assessment & Plan Note (Signed)
Patient has significant instability noted.  Patient does have arthritic changes.  Patient has failed all other conservative therapy.  Due to the instability and the abnormal thigh to calf ratio I do believe that patient would do well with a custom brace.  Patient would likely need this lifelong.  Patient wants to avoid surgical intervention.  Follow-up with me again in 6 to 8 weeks

## 2018-10-13 NOTE — Patient Instructions (Signed)
Keep doing exercises Up to you for PT Can always do injections on the knees if needed Knee brace will help Follow up in 2 months if needed
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
## 2018-10-14 ENCOUNTER — Encounter: Payer: Self-pay | Admitting: Internal Medicine

## 2018-10-14 ENCOUNTER — Other Ambulatory Visit: Payer: Self-pay | Admitting: Cardiovascular Disease

## 2018-10-14 ENCOUNTER — Telehealth: Payer: Self-pay | Admitting: Emergency Medicine

## 2018-10-14 ENCOUNTER — Other Ambulatory Visit (INDEPENDENT_AMBULATORY_CARE_PROVIDER_SITE_OTHER): Payer: Medicare HMO

## 2018-10-14 ENCOUNTER — Other Ambulatory Visit: Payer: Self-pay

## 2018-10-14 ENCOUNTER — Ambulatory Visit (INDEPENDENT_AMBULATORY_CARE_PROVIDER_SITE_OTHER): Payer: Medicare HMO | Admitting: Internal Medicine

## 2018-10-14 VITALS — BP 140/78 | HR 77 | Temp 98.2°F | Ht 74.0 in | Wt 317.0 lb

## 2018-10-14 DIAGNOSIS — I5022 Chronic systolic (congestive) heart failure: Secondary | ICD-10-CM | POA: Diagnosis not present

## 2018-10-14 DIAGNOSIS — E559 Vitamin D deficiency, unspecified: Secondary | ICD-10-CM

## 2018-10-14 DIAGNOSIS — E538 Deficiency of other specified B group vitamins: Secondary | ICD-10-CM

## 2018-10-14 DIAGNOSIS — Z0000 Encounter for general adult medical examination without abnormal findings: Secondary | ICD-10-CM | POA: Diagnosis not present

## 2018-10-14 DIAGNOSIS — E119 Type 2 diabetes mellitus without complications: Secondary | ICD-10-CM

## 2018-10-14 DIAGNOSIS — E611 Iron deficiency: Secondary | ICD-10-CM

## 2018-10-14 LAB — LIPID PANEL
Cholesterol: 164 mg/dL (ref 0–200)
HDL: 31.1 mg/dL — ABNORMAL LOW (ref >39.00)
LDL Cholesterol: 96 mg/dL (ref 0–99)
NonHDL: 132.43
Total CHOL/HDL Ratio: 5
Triglycerides: 182 mg/dL — ABNORMAL HIGH (ref 0.0–149.0)
VLDL: 36.4 mg/dL (ref 0.0–40.0)

## 2018-10-14 LAB — BASIC METABOLIC PANEL
BUN: 54 mg/dL — ABNORMAL HIGH (ref 6–23)
CO2: 29 mEq/L (ref 19–32)
Calcium: 9.5 mg/dL (ref 8.4–10.5)
Chloride: 102 mEq/L (ref 96–112)
Creatinine, Ser: 4.78 mg/dL (ref 0.40–1.50)
GFR: 15.28 mL/min — ABNORMAL LOW (ref >60.00)
Glucose, Bld: 89 mg/dL (ref 70–99)
Potassium: 4.1 mEq/L (ref 3.5–5.1)
Sodium: 141 mEq/L (ref 135–145)

## 2018-10-14 LAB — TSH: TSH: 0.03 u[IU]/mL — ABNORMAL LOW (ref 0.35–4.50)

## 2018-10-14 LAB — IBC PANEL
Iron: 38 ug/dL — ABNORMAL LOW (ref 42–165)
Saturation Ratios: 9.2 % — ABNORMAL LOW (ref 20.0–50.0)
Transferrin: 296 mg/dL (ref 212.0–360.0)

## 2018-10-14 LAB — CBC WITH DIFFERENTIAL/PLATELET
Basophils Absolute: 0 10*3/uL (ref 0.0–0.1)
Basophils Relative: 0.3 % (ref 0.0–3.0)
Eosinophils Absolute: 0.2 10*3/uL (ref 0.0–0.7)
Eosinophils Relative: 3.4 % (ref 0.0–5.0)
HCT: 35.9 % — ABNORMAL LOW (ref 39.0–52.0)
Hemoglobin: 11.5 g/dL — ABNORMAL LOW (ref 13.0–17.0)
Lymphocytes Relative: 24.8 % (ref 12.0–46.0)
Lymphs Abs: 1.6 10*3/uL (ref 0.7–4.0)
MCHC: 32 g/dL (ref 30.0–36.0)
MCV: 86 fl (ref 78.0–100.0)
Monocytes Absolute: 0.5 10*3/uL (ref 0.1–1.0)
Monocytes Relative: 7.4 % (ref 3.0–12.0)
Neutro Abs: 4.1 10*3/uL (ref 1.4–7.7)
Neutrophils Relative %: 64.1 % (ref 43.0–77.0)
Platelets: 202 10*3/uL (ref 150.0–400.0)
RBC: 4.18 Mil/uL — ABNORMAL LOW (ref 4.22–5.81)
RDW: 15.4 % (ref 11.5–15.5)
WBC: 6.5 10*3/uL (ref 4.0–10.5)

## 2018-10-14 LAB — HEPATIC FUNCTION PANEL
ALT: 16 U/L (ref 0–53)
AST: 15 U/L (ref 0–37)
Albumin: 4.4 g/dL (ref 3.5–5.2)
Alkaline Phosphatase: 43 U/L (ref 39–117)
Bilirubin, Direct: 0.1 mg/dL (ref 0.0–0.3)
Total Bilirubin: 0.4 mg/dL (ref 0.2–1.2)
Total Protein: 6.3 g/dL (ref 6.0–8.3)

## 2018-10-14 LAB — PSA: PSA: 2.01 ng/mL (ref 0.10–4.00)

## 2018-10-14 LAB — HEMOGLOBIN A1C: Hgb A1c MFr Bld: 6.3 % (ref 4.6–6.5)

## 2018-10-14 LAB — VITAMIN B12: Vitamin B-12: 332 pg/mL (ref 211–911)

## 2018-10-14 LAB — VITAMIN D 25 HYDROXY (VIT D DEFICIENCY, FRACTURES): VITD: 21.39 ng/mL — ABNORMAL LOW (ref 30.00–100.00)

## 2018-10-14 MED ORDER — CARVEDILOL 25 MG PO TABS: 25.0000 mg | ORAL_TABLET | Freq: Two times a day (BID) | ORAL | 2 refills | Status: DC

## 2018-10-14 NOTE — Patient Instructions (Signed)

## 2018-10-14 NOTE — Progress Notes (Signed)
Subjective:    Patient ID: Craig Flynn, male    DOB: [birth year], 58 y.o.   MRN: [id number]  HPI Here for wellness and f/u;  Overall doing ok;  Pt denies Chest pain, worsening SOB, DOE, wheezing, orthopnea, PND, worsening LE edema, palpitations, dizziness or syncope.  Pt denies neurological change such as new headache, facial or extremity weakness.  Pt denies polydipsia, polyuria, or low sugar symptoms. Pt states overall good compliance with treatment and medications, good tolerability, and has been trying to follow appropriate diet.  Pt denies worsening depressive symptoms, suicidal ideation or panic. No fever, night sweats, wt loss, loss of appetite, or other constitutional symptoms.  Pt states good ability with ADL's, has low fall risk, home safety reviewed and adequate, no other significant changes in hearing or vision, and only occasionally active with exercise.  No new complaints Past Medical History:  Diagnosis Date  . Anemia 06/2015  . Arthritis    knee  . Cancer [facility])    thyroid cancer  . Cardiomyopathy ([place])    a. 10/2013: EF reduced to 35-40% b. 09/2014: EF improved to 55-60%, Grade 1 DD noted.  . Chest pain 06/2015  . CHF (congestive heart failure) ([place])   . CKD (chronic kidney disease), stage IV [facility])    sees  Dr Lorrene Reid  . Dyspnea    with exerion   . GERD (gastroesophageal reflux disease)   . Gout   . Hypertension   . Obesity   . Sleep apnea    not wearing c-pap now, needs new slep study per pt. (01/22/2017)  . Tuberculosis 1981   while the pt was in the service.  . Tubular adenoma of colon 10/2015  . Wears glasses    Past Surgical History:  Procedure Laterality Date  . AV FISTULA PLACEMENT Left 03/25/2016   Procedure: Creation of Left Arm ARTERIOVENOUS (AV) FISTULA;  Surgeon: Elam Dutch, MD;  Location: [facility] OR;  Service: Vascular;  Laterality: Left;  . COLONOSCOPY W/ BIOPSIES AND POLYPECTOMY     "no problem"  . HERNIA REPAIR    . LAPAROSCOPIC CHOLECYSTECTOMY     . LIGATION OF COMPETING BRANCHES OF ARTERIOVENOUS FISTULA Left 01/25/2018   Procedure: LIGATION OF COMPETING BRANCHES And Revision of ARTERIOVENOUS FISTULA LEFT ARM.;  Surgeon: Elam Dutch, MD;  Location: [place];  Service: Vascular;  Laterality: Left;  . THYROIDECTOMY  01/22/2017  . THYROIDECTOMY Left 01/22/2017   Procedure: THYROIDECTOMY;  Surgeon: Melida Quitter, MD;  Location: [place];  Service: ENT;  Laterality: Left;  . THYROIDECTOMY Right 03/26/2017   Procedure: COMPLETION OF THYROIDECTOMY;  Surgeon: Melida Quitter, MD;  Location: [place];  Service: ENT;  Laterality: Right;  . UMBILICAL HERNIA REPAIR    . WISDOM TOOTH EXTRACTION      reports that he has been smoking cigarettes. He has a 20.00 pack-year smoking history. He has never used smokeless tobacco. He reports previous alcohol use. He reports that he does not use drugs. family history includes Healthy in his maternal grandfather, paternal grandfather, and paternal grandmother; Hypertension in his maternal grandmother; Kidney disease in his maternal grandmother; Pneumonia in his father; Stroke in his mother. No Known Allergies Current Outpatient Medications on File Prior to Visit  Medication Sig Dispense Refill  . acetaminophen (TYLENOL) 500 MG tablet Take 2 tablets (1,000 mg total) by mouth every 8 (eight) hours as needed for mild pain or headache. 60 tablet 0  . allopurinol (ZYLOPRIM) 300 MG tablet TAKE 1 TABLET BY  MOUTH DAILY 90 tablet 1  . aspirin EC 81 MG tablet Take 81 mg by mouth daily.    . calcitRIOL (ROCALTROL) 0.25 MCG capsule Take 1 capsule by mouth 3 (three) times a week.  5  . fenofibrate 54 MG tablet TAKE 1 TABLET BY MOUTH ONCE DAILY 90 tablet 3  . furosemide (LASIX) 80 MG tablet Take 1 tablet (80 mg total) by mouth 2 (two) times daily. (Patient taking differently: Take 160 mg by mouth 2 (two) times daily. Morning & afternoon) 60 tablet 1  . isosorbide mononitrate (IMDUR) 30 MG 24 hr tablet TAKE 2 TABLETS(60 MG) BY MOUTH  DAILY 60 tablet 5  . nitroGLYCERIN (NITROSTAT) 0.4 MG SL tablet Place 1 tablet (0.4 mg total) under the tongue every 5 (five) minutes as needed for chest pain. (Patient taking differently: Place 0.4 mg under the tongue every 5 (five) minutes as needed for chest pain. ) 25 tablet 3  . potassium chloride SA (K-DUR,KLOR-CON) 20 MEQ tablet Take 20 mEq by mouth 2 (two) times daily. Morning & afternoon    . levothyroxine (SYNTHROID) 200 MCG tablet Take 1 tablet (200 mcg total) by mouth daily. 30 tablet 11   No current facility-administered medications on file prior to visit.    Review of Systems Constitutional: Negative for other unusual diaphoresis, sweats, appetite or weight changes HENT: Negative for other worsening hearing loss, ear pain, facial swelling, mouth sores or neck stiffness.   Eyes: Negative for other worsening pain, redness or other visual disturbance.  Respiratory: Negative for other stridor or swelling Cardiovascular: Negative for other palpitations or other chest pain  Gastrointestinal: Negative for worsening diarrhea or loose stools, blood in stool, distention or other pain Genitourinary: Negative for hematuria, flank pain or other change in urine volume.  Musculoskeletal: Negative for myalgias or other joint swelling.  Skin: Negative for other color change, or other wound or worsening drainage.  Neurological: Negative for other syncope or numbness. Hematological: Negative for other adenopathy or swelling Psychiatric/Behavioral: Negative for hallucinations, other worsening agitation, SI, self-injury, or new decreased concentration All other system neg per pt    Objective:   Physical Exam BP 140/78   Pulse 77   Temp 98.2 F (36.8 C) (Oral)   Ht 6\' 2"  (1.88 m)   Wt (!) 317 lb (143.8 kg)   SpO2 99%   BMI 40.70 kg/m  VS noted,  Constitutional: Pt is oriented to person, place, and time. Appears well-developed and well-nourished, in no significant distress and comfortable  Head: Normocephalic and atraumatic  Eyes: Conjunctivae and EOM are normal. Pupils are equal, round, and reactive to light Right Ear: External ear normal without discharge Left Ear: External ear normal without discharge Nose: Nose without discharge or deformity Mouth/Throat: Oropharynx is without other ulcerations and moist  Neck: Normal range of motion. Neck supple. No JVD present. No tracheal deviation present or significant neck LA or mass Cardiovascular: Normal rate, regular rhythm, normal heart sounds and intact distal pulses.   Pulmonary/Chest: WOB normal and breath sounds without rales or wheezing  Abdominal: Soft. Bowel sounds are normal. NT. No HSM  Musculoskeletal: Normal range of motion. Exhibits no edema Lymphadenopathy: Has no other cervical adenopathy.  Neurological: Pt is alert and oriented to person, place, and time. Pt has normal reflexes. No cranial nerve deficit. Motor grossly intact, Gait intact Skin: Skin is warm and dry. No rash noted or new ulcerations Psychiatric:  Has normal mood and affect. Behavior is normal without agitation No other exam  findings  Lab Results  Component Value Date   WBC 6.2 06/11/2017   HGB 11.9 (L) 01/25/2018   HCT 35.0 (L) 01/25/2018   PLT 222 06/11/2017   GLUCOSE 94 01/25/2018   CHOL 128 08/11/2017   TRIG 175.0 (H) 08/11/2017   HDL 25.10 (L) 08/11/2017   LDLCALC 68 08/11/2017   ALT 20 03/06/2015   AST 11 03/06/2015   NA 139 01/25/2018   K 4.4 01/25/2018   CL 101 06/11/2017   CREATININE 4.73 (H) 06/11/2017   BUN 54 (H) 06/11/2017   CO2 23 06/11/2017   TSH 1.530 11/17/2013   PSA 0.98 08/11/2017   HGBA1C 6.2 08/11/2017       Assessment & Plan:

## 2018-10-14 NOTE — Telephone Encounter (Signed)
Received call from lab with Critical lab value of 4.78 creatinine

## 2018-10-15 ENCOUNTER — Encounter: Payer: Self-pay | Admitting: Internal Medicine

## 2018-10-15 NOTE — Assessment & Plan Note (Signed)
stable overall by history and exam, recent data reviewed with pt, and pt to continue medical treatment as before,  to f/u any worsening symptoms or concerns  

## 2018-10-15 NOTE — Assessment & Plan Note (Signed)

## 2018-10-17 ENCOUNTER — Telehealth: Payer: Self-pay

## 2018-10-17 ENCOUNTER — Encounter: Payer: Self-pay | Admitting: Internal Medicine

## 2018-10-17 ENCOUNTER — Other Ambulatory Visit: Payer: Self-pay | Admitting: Internal Medicine

## 2018-10-17 DIAGNOSIS — E611 Iron deficiency: Secondary | ICD-10-CM

## 2018-10-17 MED ORDER — VITAMIN D (ERGOCALCIFEROL) 1.25 MG (50000 UNIT) PO CAPS: 50000.0000 [IU] | ORAL_CAPSULE | ORAL | 0 refills | Status: DC

## 2018-10-17 MED ORDER — POLYSACCHARIDE IRON COMPLEX 150 MG PO CAPS: 150.0000 mg | ORAL_CAPSULE | Freq: Every day | ORAL | 0 refills | Status: DC

## 2018-10-17 NOTE — Telephone Encounter (Signed)
-----   Message from Biagio Borg, MD sent at 10/17/2018 12:26 PM EDT ----- Letter sent, cont same tx except  The test results show that your current treatment is OK, except the Vitamin D level is low, the Iron level is low, and your Thyroid test is low (meaning that you may be on too much medication).    We need to: 1) Please take Vitamin D 50000 units weekly for 12 weeks, then plan to change to OTC Vitamin D3 at 2000 units per day, indefinitely. 2)  Start Nu-iron 1 pill per day for 3 months 3)  Refer to Gastroenterology regarding any possible internal bleeding 4)  The office staff ([place]) to call you to make sure that you are being followed as I think you are by Endocrinology for the thyroid.Craig Flynn to please inform pt, I will do rx x 2, referral, and let me know if he need endo referral as I think he follows with Dr Craig Flynn (? An ENT vs endo)

## 2018-10-17 NOTE — Telephone Encounter (Signed)
Called pt, LVM.   CRM created.  

## 2018-10-28 ENCOUNTER — Other Ambulatory Visit: Payer: Self-pay

## 2018-10-28 ENCOUNTER — Emergency Department (HOSPITAL_COMMUNITY)
Admission: EM | Admit: 2018-10-28 | Discharge: 2018-10-28 | Disposition: A | Discharge status: Home / Self Care | Payer: Medicare HMO | Attending: Emergency Medicine | Admitting: Emergency Medicine

## 2018-10-28 ENCOUNTER — Encounter (HOSPITAL_COMMUNITY): Payer: Self-pay

## 2018-10-28 ENCOUNTER — Emergency Department (HOSPITAL_COMMUNITY): Payer: Medicare HMO

## 2018-10-28 DIAGNOSIS — I130 Hypertensive heart and chronic kidney disease with heart failure and stage 1 through stage 4 chronic kidney disease, or unspecified chronic kidney disease: Secondary | ICD-10-CM | POA: Diagnosis not present

## 2018-10-28 DIAGNOSIS — R0789 Other chest pain: Secondary | ICD-10-CM | POA: Diagnosis not present

## 2018-10-28 DIAGNOSIS — E1122 Type 2 diabetes mellitus with diabetic chronic kidney disease: Secondary | ICD-10-CM | POA: Diagnosis not present

## 2018-10-28 DIAGNOSIS — I5042 Chronic combined systolic (congestive) and diastolic (congestive) heart failure: Secondary | ICD-10-CM | POA: Insufficient documentation

## 2018-10-28 DIAGNOSIS — R079 Chest pain, unspecified: Secondary | ICD-10-CM | POA: Diagnosis not present

## 2018-10-28 DIAGNOSIS — Z85850 Personal history of malignant neoplasm of thyroid: Secondary | ICD-10-CM | POA: Insufficient documentation

## 2018-10-28 DIAGNOSIS — R2243 Localized swelling, mass and lump, lower limb, bilateral: Secondary | ICD-10-CM | POA: Insufficient documentation

## 2018-10-28 DIAGNOSIS — Z7982 Long term (current) use of aspirin: Secondary | ICD-10-CM | POA: Diagnosis not present

## 2018-10-28 DIAGNOSIS — N184 Chronic kidney disease, stage 4 (severe): Secondary | ICD-10-CM | POA: Insufficient documentation

## 2018-10-28 DIAGNOSIS — F17210 Nicotine dependence, cigarettes, uncomplicated: Secondary | ICD-10-CM | POA: Insufficient documentation

## 2018-10-28 DIAGNOSIS — N182 Chronic kidney disease, stage 2 (mild): Secondary | ICD-10-CM | POA: Diagnosis not present

## 2018-10-28 DIAGNOSIS — Z79899 Other long term (current) drug therapy: Secondary | ICD-10-CM | POA: Diagnosis not present

## 2018-10-28 LAB — CBC
HCT: 38.1 % — ABNORMAL LOW (ref 39.0–52.0)
Hemoglobin: 11.7 g/dL — ABNORMAL LOW (ref 13.0–17.0)
MCH: 27.7 pg (ref 26.0–34.0)
MCHC: 30.7 g/dL (ref 30.0–36.0)
MCV: 90.3 fL (ref 80.0–100.0)
Platelets: 214 10*3/uL (ref 150–400)
RBC: 4.22 MIL/uL (ref 4.22–5.81)
RDW: 14.2 % (ref 11.5–15.5)
WBC: 6 10*3/uL (ref 4.0–10.5)
nRBC: 0 % (ref 0.0–0.2)

## 2018-10-28 LAB — BASIC METABOLIC PANEL
Anion gap: 12 (ref 5–15)
BUN: 49 mg/dL — ABNORMAL HIGH (ref 6–20)
CO2: 24 mmol/L (ref 22–32)
Calcium: 9.2 mg/dL (ref 8.9–10.3)
Chloride: 104 mmol/L (ref 98–111)
Creatinine, Ser: 5.53 mg/dL — ABNORMAL HIGH (ref 0.61–1.24)
GFR calc Af Amer: 12 mL/min — ABNORMAL LOW (ref >60)
GFR calc non Af Amer: 11 mL/min — ABNORMAL LOW (ref >60)
Glucose, Bld: 103 mg/dL — ABNORMAL HIGH (ref 70–99)
Potassium: 4.2 mmol/L (ref 3.5–5.1)
Sodium: 140 mmol/L (ref 135–145)

## 2018-10-28 LAB — TROPONIN I (HIGH SENSITIVITY)
Troponin I (High Sensitivity): 20 ng/L — ABNORMAL HIGH (ref <18)
Troponin I (High Sensitivity): 21 ng/L — ABNORMAL HIGH (ref <18)

## 2018-10-28 MED ORDER — SODIUM CHLORIDE 0.9% FLUSH
3.0000 mL | Freq: Once | INTRAVENOUS | Status: AC
  Administered 2018-10-28: 3 mL via INTRAVENOUS

## 2018-10-28 NOTE — Discharge Instructions (Addendum)
Please read instructions below. Follow up with your primary care provider by Monday. We recommended you stay overnight so we can be sure your heart is okay, however you preferred to go home tonight.  Return to the ER for new or worsening symptoms; including worsening chest pain, shortness of breath, pain that radiates to the arm or neck, pain or shortness of breath worsened with exertion.

## 2018-10-28 NOTE — ED Provider Notes (Signed)
[facility] DEPT Provider Note   CSN: [id number] Arrival date & time: 10/28/18  1507    History   Chief Complaint Chief Complaint  Patient presents with  . Chest Pain    HPI Craig Flynn is a 58 y.o. male with past medical history of CKD stage IV, GERD, CHF, thyroid cancer, hypertension, hyperlipidemia, type 2 diabetes, presenting to the emergency department with complaint of sudden onset of sharp right-sided chest pain that began while he was riding in the car.  He states his right toe cramped and he reached down to massage it and then began feeling a sudden sharp pain in his right chest.  He said it lasted about 30 minutes and resolved without intervention.  He was sitting in the car the entire time this occurred.  No aggravating or alleviating factors.  No medications taken for symptoms.  He denies any other associated symptoms, including no nausea, vomiting, diaphoresis, radiation of pain, shortness of breath.  He has never had pain like this before.  Per chart review he had a myocardial perfusion scan done at [facility] in 2018 which is nonischemic and unremarkable.  He states he does have CKD, is not on dialysis, he is actually improving his kidney function and is followed by Dr. Lorrene Reid.     The history is provided by the patient.    Past Medical History:  Diagnosis Date  . Anemia 06/2015  . Arthritis    knee  . Cancer [facility])    thyroid cancer  . Cardiomyopathy ([place])    a. 10/2013: EF reduced to 35-40% b. 09/2014: EF improved to 55-60%, Grade 1 DD noted.  . Chest pain 06/2015  . CHF (congestive heart failure) ([place])   . CKD (chronic kidney disease), stage IV [facility])    sees  Dr Lorrene Reid  . Dyspnea    with exerion   . GERD (gastroesophageal reflux disease)   . Gout   . Hypertension   . Obesity   . Sleep apnea    not wearing c-pap now, needs new slep study per pt. (01/22/2017)  . Tuberculosis 1981   while the pt was in the service.  . Tubular  adenoma of colon 10/2015  . Wears glasses     Patient Active Problem List   Diagnosis Date Noted  . Whiplash injuries, initial encounter 09/21/2018  . Degenerative arthritis of right knee 09/21/2018  . Chronic diastolic HF (heart failure) ([place]) 07/04/2018  . Intractable post-traumatic headache 06/16/2018  . Low back pain 06/15/2018  . Neck pain 06/15/2018  . 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 Thyroid nodule 01/22/2017  . Ptosis 01/09/2016  . Chronic systolic heart failure ([place]) 08/01/2015  . GERD (gastroesophageal reflux disease) 07/29/2015  . Osteoarthritis 05/03/2015  . Hyperlipidemia 02/26/2015  . Diastolic dysfunction 78/29/5621  . Tobacco abuse 02/26/2015  . OSA (obstructive sleep apnea) 12/27/2014  . Preventative health care 11/12/2014  . Anemia in chronic kidney disease 11/12/2014  . Left knee pain 09/22/2014  . Systolic CHF ([place]) 30/86/5784  . Paresthesia of left leg 09/21/2014  . Acute systolic CHF (congestive heart failure) ([place]) 11/17/2013  . CKD (chronic kidney disease) stage 4, GFR 15-29 ml/min ([facility]) 11/17/2013  . Morbid obesity ([place]) 11/17/2013  . Diabetes mellitus ([place]) 12/12/2011  . Acute on chronic renal failure ([place]) 12/11/2011  . Hypokalemia 12/11/2011  . HTN (hypertension) 12/11/2011  . SOB (shortness of breath) 12/11/2011  . Gout 12/31/2006  . Essential hypertension 12/31/2006  .  HERNIATED LUMBAR DISK WITH RADICULOPATHY 12/30/2006    Past Surgical History:  Procedure Laterality Date  . AV FISTULA PLACEMENT Left 03/25/2016   Procedure: Creation of Left Arm ARTERIOVENOUS (AV) FISTULA;  Surgeon: Elam Dutch, MD;  Location[facility] OR;  Service: Vascular;  Laterality: Left;  . COLONOSCOPY W/ BIOPSIES AND POLYPECTOMY     "no problem"  . HERNIA REPAIR    . LAPAROSCOPIC CHOLECYSTECTOMY    . LIGATION OF COMPETING BRANCHES OF ARTERIOVENOUS FISTULA Left 01/25/2018   Procedure: LIGATION OF COMPETING BRANCHES And Revision of ARTERIOVENOUS FISTULA LEFT  ARM.;  Surgeon: Elam Dutch, MD;  Location: [place];  Service: Vascular;  Laterality: Left;  . THYROIDECTOMY  01/22/2017  . THYROIDECTOMY Left 01/22/2017   Procedure: THYROIDECTOMY;  Surgeon: Melida Quitter, MD;  Location: [place];  Service: ENT;  Laterality: Left;  . THYROIDECTOMY Right 03/26/2017   Procedure: COMPLETION OF THYROIDECTOMY;  Surgeon: Melida Quitter, MD;  Location: [place];  Service: ENT;  Laterality: Right;  . UMBILICAL HERNIA REPAIR    . WISDOM TOOTH EXTRACTION          Home Medications    Prior to Admission medications   Medication Sig Start Date End Date Taking? Authorizing Provider  acetaminophen (TYLENOL) 500 MG tablet Take 2 tablets (1,000 mg total) by mouth every 8 (eight) hours as needed for mild pain or headache. 05/05/18  Yes Lance Sell, NP  allopurinol (ZYLOPRIM) 100 MG tablet Take 100 mg by mouth daily.  10/27/18  Yes [provider]  aspirin EC 81 MG tablet Take 81 mg by mouth daily.   Yes [provider]  calcitRIOL (ROCALTROL) 0.25 MCG capsule Take 1 capsule by mouth 3 (three) times a week. 01/27/18  Yes [provider]  carvedilol (COREG) 25 MG tablet Take 1 tablet (25 mg total) by mouth 2 (two) times daily. 10/14/18  Yes Biagio Borg, MD  fenofibrate 54 MG tablet TAKE 1 TABLET BY MOUTH ONCE DAILY Patient taking differently: Take 54 mg by mouth daily.  05/18/18  Yes Minus Breeding, MD  furosemide (LASIX) 80 MG tablet Take 1 tablet (80 mg total) by mouth 2 (two) times daily. Patient taking differently: Take 160 mg by mouth 2 (two) times daily. Morning & afternoon 02/27/15  Yes Reyne Dumas, MD  iron polysaccharides (NU-IRON) 150 MG capsule Take 1 capsule (150 mg total) by mouth daily. 10/17/18  Yes Biagio Borg, MD  isosorbide mononitrate (IMDUR) 30 MG 24 hr tablet TAKE 2 TABLETS(60 MG) BY MOUTH DAILY Patient taking differently: Take 60 mg by mouth daily.  10/11/18  Yes Minus Breeding, MD  levothyroxine (SYNTHROID) 150 MCG tablet  Take 150 mcg by mouth daily. 10/20/18  Yes [provider]  potassium chloride SA (K-DUR,KLOR-CON) 20 MEQ tablet Take 20 mEq by mouth daily.    Yes [provider]  Vitamin D, Ergocalciferol, (DRISDOL) 1.25 MG (50000 UT) CAPS capsule Take 1 capsule (50,000 Units total) by mouth every 7 (seven) days. 10/17/18  Yes Biagio Borg, MD  carvedilol (COREG) 25 MG tablet TAKE 1 TABLET BY MOUTH TWICE DAILY Patient not taking: Reported on 10/28/2018 10/17/18   Croitoru, Mihai, MD  levothyroxine (SYNTHROID) 200 MCG tablet Take 1 tablet (200 mcg total) by mouth daily. Patient not taking: Reported on 10/28/2018 03/26/17 07/04/18  Melida Quitter, MD  nitroGLYCERIN (NITROSTAT) 0.4 MG SL tablet Place 1 tablet (0.4 mg total) under the tongue every 5 (five) minutes as needed for chest pain. Patient taking differently:  Place 0.4 mg under the tongue every 5 (five) minutes as needed for chest pain.  06/14/17   Minus Breeding, MD    Family History Family History  Problem Relation Age of Onset  . Stroke Mother   . Pneumonia Father        died of Pneumonia x3  . Kidney disease Maternal Grandmother   . Hypertension Maternal Grandmother   . Healthy Maternal Grandfather   . Healthy Paternal Grandmother   . Healthy Paternal Grandfather   . CAD Neg Hx   . Colon cancer Neg Hx   . Esophageal cancer Neg Hx   . Pancreatic cancer Neg Hx   . Prostate cancer Neg Hx   . Stomach cancer Neg Hx   . Rectal cancer Neg Hx     Social History Social History   Tobacco Use  . Smoking status: Current Every Day Smoker    Packs/day: 0.50    Years: 40.00    Pack years: 20.00    Types: Cigarettes  . Smokeless tobacco: Never Used  Substance Use Topics  . Alcohol use: Not Currently    Alcohol/week: 0.0 standard drinks  . Drug use: No     Allergies   Patient has no known allergies.   Review of Systems Review of Systems  Cardiovascular: Positive for chest pain and leg swelling (chronic, unchanging).  All other  systems reviewed and are negative.    Physical Exam Updated Vital Signs BP (!) 172/91   Pulse 74   Temp 98.9 F (37.2 C) (Oral)   Resp 13   Ht 6\' 2"  (1.88 m)   Wt (!) 144.2 kg   SpO2 100%   BMI 40.83 kg/m   Physical Exam Vitals signs and nursing note reviewed.  Constitutional:      General: He is not in acute distress.    Appearance: He is well-developed. He is obese. He is not ill-appearing.  HENT:     Head: Normocephalic and atraumatic.  Eyes:     Conjunctiva/sclera: Conjunctivae normal.  Cardiovascular:     Rate and Rhythm: Normal rate and regular rhythm.     Heart sounds: Normal heart sounds.  Pulmonary:     Effort: Pulmonary effort is normal. No respiratory distress.     Breath sounds: Normal breath sounds.  Chest:     Chest wall: No tenderness.  Abdominal:     General: Bowel sounds are normal.     Palpations: Abdomen is soft.     Tenderness: There is no abdominal tenderness. There is no guarding or rebound.  Musculoskeletal:     Comments: BLE edema (chronic)  Skin:    General: Skin is warm.  Neurological:     Mental Status: He is alert.  Psychiatric:        Behavior: Behavior normal.      ED Treatments / Results  Labs (all labs ordered are listed, but only abnormal results are displayed) Labs Reviewed  BASIC METABOLIC PANEL - Abnormal; Notable for the following components:      Result Value   Glucose, Bld 103 (*)    BUN 49 (*)    Creatinine, Ser 5.53 (*)    GFR calc non Af Amer 11 (*)    GFR calc Af Amer 12 (*)    All other components within normal limits  CBC - Abnormal; Notable for the following components:   Hemoglobin 11.7 (*)    HCT 38.1 (*)    All other components within normal limits  TROPONIN I (HIGH SENSITIVITY) - Abnormal; Notable for the following components:   Troponin I (High Sensitivity) 20 (*)    All other components within normal limits  TROPONIN I (HIGH SENSITIVITY) - Abnormal; Notable for the following components:    Troponin I (High Sensitivity) 21 (*)    All other components within normal limits    EKG EKG Interpretation  Date/Time:  Friday October 28 2018 15:18:11 EDT Ventricular Rate:  77 PR Interval:  184 QRS Duration: 92 QT Interval:  418 QTC Calculation: 473 R Axis:   51 Text Interpretation:  Sinus rhythm with occasional and consecutive Premature ventricular complexes Abnormal ECG Confirmed by Milton Ferguson [phone]) on 10/28/2018 7:13:15 PM   Radiology Dg Chest 2 View  Result Date: 10/28/2018 CLINICAL DATA:  Chest pain EXAM: CHEST - 2 VIEW COMPARISON:  June 11, 2017 FINDINGS: Lungs are clear. Heart size and pulmonary vascularity are normal. No adenopathy. No pneumothorax. No bone lesions. IMPRESSION: No edema or consolidation. Electronically Signed   By: Lowella Grip III M.D.   On: 10/28/2018 16:09    Procedures Procedures (including critical care time)  Medications Ordered in ED Medications  sodium chloride flush (NS) 0.9 % injection 3 mL (3 mLs Intravenous Given 10/28/18 1751)     Initial Impression / Assessment and Plan / ED Course  I have reviewed the triage vital signs and the nursing notes.  Pertinent labs & imaging results that were available during my care of the patient were reviewed by me and considered in my medical decision making (see chart for details).        Patient presenting with atypical chest pain began while driving in the car, right-sided, sharp, without associated symptoms.  Relieved without intervention, however did last 30 minutes.  Patient has multiple risk factors for CAD.  He did have a normal myocardial perfusion scan done in 2018 at [facility].  He has no history of MI.  He is asymptomatic on evaluation.  Pain is not reproducible on exam.  He is noted to be hypertensive in the ED.  Labs today reveal a creatinine of 5.53, BUN 49, GFR of 12.  Patient thinks this is around his baseline, however he is unsure.  His initial troponin was 20, and repeat  troponin is 21.  Chest x-ray is negative.  EKG is nonischemic.  Heart score is 3.  Patient discussed with Dr. Roderic Palau, given patient's multiple risk factors and troponin over 18, recommended observation overnight.  Patient states he would rather follow-up closely with his primary care outpatient.  He is instructed of strict return precautions and need to follow-up soon as possible as his PCP.  He is aware the risks of going home with a possible missed diagnosis.  Patient discharged.  Discussed results, findings, treatment and follow up. Patient advised of return precautions. Patient verbalized understanding and agreed with plan.  Final Clinical Impressions(s) / ED Diagnoses   Final diagnoses:  Atypical chest pain    ED Discharge Orders    None       Robinson, [place] N, PA-C 10/28/18 Starlyn Skeans, MD 10/28/18 2223

## 2018-10-28 NOTE — ED Triage Notes (Signed)
Patient states he went to bend over bc he was having a cramp in his toe. When he stood back up he states he had a sharp pain shot up his leg into his lower/mid chest area and had some light headedness.   4/10 sharp pain.   Denies N/V   A/ox4 Ambulatory in triage.

## 2018-11-01 ENCOUNTER — Ambulatory Visit (INDEPENDENT_AMBULATORY_CARE_PROVIDER_SITE_OTHER): Payer: Medicare HMO | Admitting: Internal Medicine

## 2018-11-01 ENCOUNTER — Other Ambulatory Visit: Payer: Self-pay

## 2018-11-01 ENCOUNTER — Encounter: Payer: Self-pay | Admitting: Internal Medicine

## 2018-11-01 DIAGNOSIS — E119 Type 2 diabetes mellitus without complications: Secondary | ICD-10-CM

## 2018-11-01 DIAGNOSIS — I5022 Chronic systolic (congestive) heart failure: Secondary | ICD-10-CM

## 2018-11-01 DIAGNOSIS — R079 Chest pain, unspecified: Secondary | ICD-10-CM | POA: Insufficient documentation

## 2018-11-01 MED ORDER — PREDNISONE 10 MG PO TABS: ORAL_TABLET | ORAL | 0 refills | Status: DC

## 2018-11-01 NOTE — Patient Instructions (Signed)
Please take all new medication as prescribed - the prednisone  Please continue all other medications as before, and refills have been done if requested.  Please have the pharmacy call with any other refills you may need.  Please continue your efforts at being more active, low cholesterol diet, and weight control.  Please keep your appointments with your specialists as you may have planned     

## 2018-11-01 NOTE — Progress Notes (Signed)
Subjective:    Patient ID: Craig Flynn, male    DOB: 05/04/1960, 58 y.o.   MRN: [id number]  HPI  Here with c/o chest pain, somewhat right low parasternal to start with, then later left mid parasternal and left chest, without radiation, diaphoresis, n/v, palps, dizziness, sob or syncope.  Mild intermittent for 3 days, seemed to start after bending to check the right foot.  Gained wt being off for pandemic so difficult to bend to see foot.  Seen in ED with ECG, cxr, troponins neg for acute. Pt denies new neurological symptoms such as new headache, or facial or extremity weakness or numbness   Pt denies polydipsia, polyuria Wt Readings from Last 3 Encounters:  11/01/18 (!) 323 lb (146.5 kg)  10/28/18 (!) 318 lb (144.2 kg)  10/14/18 (!) 317 lb (143.8 kg)   Past Medical History:  Diagnosis Date  . Anemia 06/2015  . Arthritis    knee  . Cancer [facility])    thyroid cancer  . Cardiomyopathy ([place])    a. 10/2013: EF reduced to 35-40% b. 09/2014: EF improved to 55-60%, Grade 1 DD noted.  . Chest pain 06/2015  . CHF (congestive heart failure) ([place])   . CKD (chronic kidney disease), stage IV [facility])    sees  Dr Lorrene Reid  . Dyspnea    with exerion   . GERD (gastroesophageal reflux disease)   . Gout   . Hypertension   . Obesity   . Sleep apnea    not wearing c-pap now, needs new slep study per pt. (01/22/2017)  . Tuberculosis 1981   while the pt was in the service.  . Tubular adenoma of colon 10/2015  . Wears glasses    Past Surgical History:  Procedure Laterality Date  . AV FISTULA PLACEMENT Left 03/25/2016   Procedure: Creation of Left Arm ARTERIOVENOUS (AV) FISTULA;  Surgeon: Elam Dutch, MD;  Location: [facility] OR;  Service: Vascular;  Laterality: Left;  . COLONOSCOPY W/ BIOPSIES AND POLYPECTOMY     "no problem"  . HERNIA REPAIR    . LAPAROSCOPIC CHOLECYSTECTOMY    . LIGATION OF COMPETING BRANCHES OF ARTERIOVENOUS FISTULA Left 01/25/2018   Procedure: LIGATION OF COMPETING BRANCHES And  Revision of ARTERIOVENOUS FISTULA LEFT ARM.;  Surgeon: Elam Dutch, MD;  Location: [place];  Service: Vascular;  Laterality: Left;  . THYROIDECTOMY  01/22/2017  . THYROIDECTOMY Left 01/22/2017   Procedure: THYROIDECTOMY;  Surgeon: Melida Quitter, MD;  Location: [place];  Service: ENT;  Laterality: Left;  . THYROIDECTOMY Right 03/26/2017   Procedure: COMPLETION OF THYROIDECTOMY;  Surgeon: Melida Quitter, MD;  Location: [place];  Service: ENT;  Laterality: Right;  . UMBILICAL HERNIA REPAIR    . WISDOM TOOTH EXTRACTION      reports that he has been smoking cigarettes. He has a 20.00 pack-year smoking history. He has never used smokeless tobacco. He reports previous alcohol use. He reports that he does not use drugs. family history includes Healthy in his maternal grandfather, paternal grandfather, and paternal grandmother; Hypertension in his maternal grandmother; Kidney disease in his maternal grandmother; Pneumonia in his father; Stroke in his mother. No Known Allergies Current Outpatient Medications on File Prior to Visit  Medication Sig Dispense Refill  . acetaminophen (TYLENOL) 500 MG tablet Take 2 tablets (1,000 mg total) by mouth every 8 (eight) hours as needed for mild pain or headache. 60 tablet 0  . allopurinol (ZYLOPRIM) 100 MG tablet Take 100 mg by mouth daily.     Marland Kitchen  aspirin EC 81 MG tablet Take 81 mg by mouth daily.    . calcitRIOL (ROCALTROL) 0.25 MCG capsule Take 1 capsule by mouth 3 (three) times a week.  5  . carvedilol (COREG) 25 MG tablet TAKE 1 TABLET BY MOUTH TWICE DAILY 180 tablet 2  . carvedilol (COREG) 25 MG tablet Take 1 tablet (25 mg total) by mouth 2 (two) times daily. 180 tablet 2  . fenofibrate 54 MG tablet TAKE 1 TABLET BY MOUTH ONCE DAILY (Patient taking differently: Take 54 mg by mouth daily. ) 90 tablet 3  . furosemide (LASIX) 80 MG tablet Take 1 tablet (80 mg total) by mouth 2 (two) times daily. (Patient taking differently: Take 160 mg by mouth 2 (two) times daily.  Morning & afternoon) 60 tablet 1  . iron polysaccharides (NU-IRON) 150 MG capsule Take 1 capsule (150 mg total) by mouth daily. 90 capsule 0  . isosorbide mononitrate (IMDUR) 30 MG 24 hr tablet TAKE 2 TABLETS(60 MG) BY MOUTH DAILY (Patient taking differently: Take 60 mg by mouth daily. ) 60 tablet 5  . levothyroxine (SYNTHROID) 150 MCG tablet Take 150 mcg by mouth daily.    . nitroGLYCERIN (NITROSTAT) 0.4 MG SL tablet Place 1 tablet (0.4 mg total) under the tongue every 5 (five) minutes as needed for chest pain. (Patient taking differently: Place 0.4 mg under the tongue every 5 (five) minutes as needed for chest pain. ) 25 tablet 3  . potassium chloride SA (K-DUR,KLOR-CON) 20 MEQ tablet Take 20 mEq by mouth daily.     . Vitamin D, Ergocalciferol, (DRISDOL) 1.25 MG (50000 UT) CAPS capsule Take 1 capsule (50,000 Units total) by mouth every 7 (seven) days. 12 capsule 0  . levothyroxine (SYNTHROID) 200 MCG tablet Take 1 tablet (200 mcg total) by mouth daily. (Patient not taking: Reported on 10/28/2018) 30 tablet 11   No current facility-administered medications on file prior to visit.    Review of Systems  Constitutional: Negative for other unusual diaphoresis or sweats HENT: Negative for ear discharge or swelling Eyes: Negative for other worsening visual disturbances Respiratory: Negative for stridor or other swelling  Gastrointestinal: Negative for worsening distension or other blood Genitourinary: Negative for retention or other urinary change Musculoskeletal: Negative for other MSK pain or swelling Skin: Negative for color change or other new lesions Neurological: Negative for worsening tremors and other numbness  Psychiatric/Behavioral: Negative for worsening agitation or other fatigue All other system neg per pt    Objective:   Physical Exam BP (!) 144/88   Pulse 80   Temp 98 F (36.7 C) (Oral)   Ht 6\' 2"  (1.88 m)   Wt (!) 323 lb (146.5 kg)   SpO2 98%   BMI 41.47 kg/m  VS noted,   Constitutional: Pt appears in NAD HENT: Head: NCAT.  Right Ear: External ear normal.  Left Ear: External ear normal.  Eyes: . Pupils are equal, round, and reactive to light. Conjunctivae and EOM are normal Nose: without d/c or deformity Neck: Neck supple. Gross normal ROM Cardiovascular: Normal rate and regular rhythm.   Pulmonary/Chest: Effort normal and breath sounds without rales or wheezing.  Has left mid parasternal tender without swelling or rash Abd:  Soft, NT, ND, + BS, no organomegaly Neurological: Pt is alert. At baseline orientation, motor grossly intact Skin: Skin is warm. No rashes, other new lesions, no LE edema Psychiatric: Pt behavior is normal without agitation  No other exam findings Lab Results  Component Value Date  WBC 6.0 10/28/2018   HGB 11.7 (L) 10/28/2018   HCT 38.1 (L) 10/28/2018   PLT 214 10/28/2018   GLUCOSE 103 (H) 10/28/2018   CHOL 164 10/14/2018   TRIG 182.0 (H) 10/14/2018   HDL 31.10 (L) 10/14/2018   LDLCALC 96 10/14/2018   ALT 16 10/14/2018   AST 15 10/14/2018   NA 140 10/28/2018   K 4.2 10/28/2018   CL 104 10/28/2018   CREATININE 5.53 (H) 10/28/2018   BUN 49 (H) 10/28/2018   CO2 24 10/28/2018   TSH 0.03 (L) 10/14/2018   PSA 2.01 10/14/2018   HGBA1C 6.3 10/14/2018        Assessment & Plan:

## 2018-11-02 DIAGNOSIS — N189 Chronic kidney disease, unspecified: Secondary | ICD-10-CM | POA: Diagnosis not present

## 2018-11-02 DIAGNOSIS — C73 Malignant neoplasm of thyroid gland: Secondary | ICD-10-CM | POA: Diagnosis not present

## 2018-11-02 DIAGNOSIS — E890 Postprocedural hypothyroidism: Secondary | ICD-10-CM | POA: Diagnosis not present

## 2018-11-04 ENCOUNTER — Encounter: Payer: Self-pay | Admitting: Internal Medicine

## 2018-11-04 NOTE — Assessment & Plan Note (Signed)
Atypical, ? Neuritic vs msk, for predpac asd trial, tylenol prn,  to f/u any worsening symptoms or concerns

## 2018-11-04 NOTE — Assessment & Plan Note (Signed)
stable overall by history and exam, recent data reviewed with pt, and pt to continue medical treatment as before,  to f/u any worsening symptoms or concerns  

## 2018-11-21 ENCOUNTER — Other Ambulatory Visit: Payer: Self-pay

## 2018-11-21 ENCOUNTER — Encounter (HOSPITAL_COMMUNITY)
Admission: RE | Admit: 2018-11-21 | Discharge: 2018-11-21 | Disposition: A | Discharge status: Home / Self Care | Payer: Medicare HMO | Source: Ambulatory Visit | Attending: Endocrinology | Admitting: Endocrinology

## 2018-11-21 DIAGNOSIS — C73 Malignant neoplasm of thyroid gland: Secondary | ICD-10-CM | POA: Diagnosis not present

## 2018-11-21 MED ORDER — THYROTROPIN ALFA 1.1 MG IM SOLR
0.9000 mg | INTRAMUSCULAR | Status: AC
  Administered 2018-11-21: 0.9 mg via INTRAMUSCULAR

## 2018-11-21 MED ORDER — STERILE WATER FOR INJECTION IJ SOLN
INTRAMUSCULAR | Status: AC
  Filled 2018-11-21: qty 10

## 2018-11-22 ENCOUNTER — Encounter (HOSPITAL_COMMUNITY)
Admission: RE | Admit: 2018-11-22 | Discharge: 2018-11-22 | Disposition: A | Discharge status: Home / Self Care | Payer: Medicare HMO | Source: Ambulatory Visit | Attending: Endocrinology | Admitting: Endocrinology

## 2018-11-22 DIAGNOSIS — C73 Malignant neoplasm of thyroid gland: Secondary | ICD-10-CM | POA: Diagnosis not present

## 2018-11-22 MED ORDER — THYROTROPIN ALFA 1.1 MG IM SOLR
0.9000 mg | INTRAMUSCULAR | Status: AC
  Administered 2018-11-22: 0.9 mg via INTRAMUSCULAR

## 2018-11-23 ENCOUNTER — Encounter (HOSPITAL_COMMUNITY)
Admission: RE | Admit: 2018-11-23 | Discharge: 2018-11-23 | Disposition: A | Discharge status: Home / Self Care | Payer: Medicare HMO | Source: Ambulatory Visit | Attending: Endocrinology | Admitting: Endocrinology

## 2018-11-23 ENCOUNTER — Other Ambulatory Visit: Payer: Self-pay

## 2018-11-23 DIAGNOSIS — C73 Malignant neoplasm of thyroid gland: Secondary | ICD-10-CM | POA: Diagnosis not present

## 2018-11-23 MED ORDER — SODIUM IODIDE I 131 CAPSULE: 3.9700 | Freq: Once | INTRAVENOUS | Status: DC | PRN

## 2018-11-23 MED ORDER — SODIUM IODIDE I 131 CAPSULE
3.7900 | Freq: Once | INTRAVENOUS | Status: AC | PRN
  Administered 2018-11-23: 3.79 via ORAL

## 2018-11-25 ENCOUNTER — Encounter (HOSPITAL_COMMUNITY)
Admission: RE | Admit: 2018-11-25 | Discharge: 2018-11-25 | Disposition: A | Discharge status: Home / Self Care | Payer: Medicare HMO | Source: Ambulatory Visit | Attending: Endocrinology | Admitting: Endocrinology

## 2018-11-25 ENCOUNTER — Other Ambulatory Visit: Payer: Self-pay

## 2018-11-25 DIAGNOSIS — C73 Malignant neoplasm of thyroid gland: Secondary | ICD-10-CM | POA: Diagnosis not present

## 2018-12-07 DIAGNOSIS — M109 Gout, unspecified: Secondary | ICD-10-CM | POA: Diagnosis not present

## 2018-12-07 DIAGNOSIS — E669 Obesity, unspecified: Secondary | ICD-10-CM | POA: Diagnosis not present

## 2018-12-07 DIAGNOSIS — D631 Anemia in chronic kidney disease: Secondary | ICD-10-CM | POA: Diagnosis not present

## 2018-12-07 DIAGNOSIS — I770 Arteriovenous fistula, acquired: Secondary | ICD-10-CM | POA: Diagnosis not present

## 2018-12-07 DIAGNOSIS — N2581 Secondary hyperparathyroidism of renal origin: Secondary | ICD-10-CM | POA: Diagnosis not present

## 2018-12-07 DIAGNOSIS — C73 Malignant neoplasm of thyroid gland: Secondary | ICD-10-CM | POA: Diagnosis not present

## 2018-12-07 DIAGNOSIS — I120 Hypertensive chronic kidney disease with stage 5 chronic kidney disease or end stage renal disease: Secondary | ICD-10-CM | POA: Diagnosis not present

## 2018-12-07 DIAGNOSIS — E876 Hypokalemia: Secondary | ICD-10-CM | POA: Diagnosis not present

## 2018-12-07 DIAGNOSIS — N185 Chronic kidney disease, stage 5: Secondary | ICD-10-CM | POA: Diagnosis not present

## 2018-12-13 ENCOUNTER — Other Ambulatory Visit: Payer: Self-pay

## 2018-12-13 ENCOUNTER — Ambulatory Visit: Payer: Medicare HMO | Admitting: Gastroenterology

## 2018-12-13 ENCOUNTER — Encounter: Payer: Self-pay | Admitting: Gastroenterology

## 2018-12-13 VITALS — BP 116/68 | HR 68 | Temp 98.7°F | Ht 74.0 in | Wt 313.6 lb

## 2018-12-13 DIAGNOSIS — Z86010 Personal history of colonic polyps: Secondary | ICD-10-CM | POA: Diagnosis not present

## 2018-12-13 DIAGNOSIS — D509 Iron deficiency anemia, unspecified: Secondary | ICD-10-CM

## 2018-12-13 MED ORDER — NA SULFATE-K SULFATE-MG SULF 17.5-3.13-1.6 GM/177ML PO SOLN: 1.0000 | Freq: Once | ORAL | 0 refills | Status: AC

## 2018-12-13 NOTE — Patient Instructions (Signed)
You have been scheduled for an endoscopy and colonoscopy. Please follow the written instructions given to you at your visit today. Please pick up your prep supplies at the pharmacy within the next 1-3 days. If you use inhalers (even only as needed), please bring them with you on the day of your procedure. Your physician has requested that you go to www.startemmi.com and enter the access code given to you at your visit today. This web site gives a general overview about your procedure. However, you should still follow specific instructions given to you by our office regarding your preparation for the procedure.  Thank you for choosing me and [facility].  Pricilla Riffle. Dagoberto Ligas., MD., Marval Regal

## 2018-12-13 NOTE — Progress Notes (Addendum)
    History of Present Illness: This is a 58 year old male with iron deficiency anemia.  He was found to have iron deficiency anemia on blood work in September.  He has no digestive complaints.  He notes no blood in his stool or urine.  He does not donate blood.  Colonoscopy and EGD performed in 2017 as outlined below. Denies weight loss, abdominal pain, constipation, diarrhea, change in stool caliber, melena, hematochezia, nausea, vomiting, dysphagia, reflux symptoms, chest pain.   Colon and EGD in 10/2015 showed one small adenomatous colon polyp, a small hiatal hernia and chronic inflammation biopsied at an irregular Z line.  Current Medications, Allergies, Past Medical History, Past Surgical History, Family History and Social History were reviewed in [employer] record.   Physical Exam: General: Well developed, well nourished, no acute distress Head: Normocephalic and atraumatic Eyes:  sclerae anicteric, EOMI Ears: Normal auditory acuity Mouth: No deformity or lesions Lungs: Clear throughout to auscultation Heart: Regular rate and rhythm; no murmurs, rubs or bruits Abdomen: Soft, non tender and non distended. No masses, hepatosplenomegaly or hernias noted. Normal Bowel sounds Rectal: Deferred to colonosocpy Musculoskeletal: Symmetrical with no gross deformities  Pulses:  Normal pulses noted Extremities: No clubbing, cyanosis, edema or deformities noted Neurological: Alert oriented x 4, grossly nonfocal Psychological:  Alert and cooperative. Normal mood and affect   Assessment and Recommendations:  1. Iron deficiency anemia.  Rule out gastrointestinal sources of blood loss including neoplasms, AVMs ulcer, etc.  Schedule colonoscopy and EGD. The risks (including bleeding, perforation, infection, missed lesions, medication reactions and possible hospitalization or surgery if complications occur), benefits, and alternatives to colonoscopy with possible biopsy and  possible polypectomy were discussed with the patient and they consent to proceed. The risks (including bleeding, perforation, infection, missed lesions, medication reactions and possible hospitalization or surgery if complications occur), benefits, and alternatives to endoscopy with possible biopsy and possible dilation were discussed with the patient and they consent to proceed.   2.  Personal history adenomatous colon polyps.  Colonoscopy as above.  3.  History of GERD. No longer has symptoms. Does not take acid suppressants.

## 2018-12-14 ENCOUNTER — Encounter: Payer: Self-pay | Admitting: Gastroenterology

## 2018-12-20 ENCOUNTER — Telehealth: Payer: Self-pay

## 2018-12-20 NOTE — Telephone Encounter (Signed)
Covid-19 screening questions   Do you now or have you had a fever in the last 14 days? NO   Do you have any respiratory symptoms of shortness of breath or cough now or in the last 14 days? NO  Do you have any family members or close contacts with diagnosed or suspected Covid-19 in the past 14 days? NO  Have you been tested for Covid-19 and found to be positive? NO        

## 2018-12-21 ENCOUNTER — Other Ambulatory Visit: Payer: Self-pay

## 2018-12-21 ENCOUNTER — Encounter: Payer: Self-pay | Admitting: Gastroenterology

## 2018-12-21 ENCOUNTER — Ambulatory Visit (AMBULATORY_SURGERY_CENTER): Payer: Medicare HMO | Admitting: Gastroenterology

## 2018-12-21 VITALS — BP 115/67 | HR 68 | Temp 98.5°F | Resp 19 | Ht 74.0 in | Wt 313.0 lb

## 2018-12-21 DIAGNOSIS — K449 Diaphragmatic hernia without obstruction or gangrene: Secondary | ICD-10-CM

## 2018-12-21 DIAGNOSIS — D509 Iron deficiency anemia, unspecified: Secondary | ICD-10-CM

## 2018-12-21 DIAGNOSIS — K219 Gastro-esophageal reflux disease without esophagitis: Secondary | ICD-10-CM | POA: Diagnosis not present

## 2018-12-21 DIAGNOSIS — K2980 Duodenitis without bleeding: Secondary | ICD-10-CM

## 2018-12-21 DIAGNOSIS — Z86010 Personal history of colonic polyps: Secondary | ICD-10-CM

## 2018-12-21 MED ORDER — SODIUM CHLORIDE 0.9 % IV SOLN: 500.0000 mL | Freq: Once | INTRAVENOUS | Status: DC

## 2018-12-21 NOTE — Op Note (Signed)
[facility] Patient Name: Elber Galyean Procedure Date: 12/21/2018 2:01 PM MRN: [id number] Endoscopist: Ladene Artist , MD Age: 58 Referring MD:  Date of Birth: October 11, 1960 Gender: Male Account #: [id number] Procedure:                Upper GI endoscopy Indications:              Iron deficiency anemia Medicines:                Monitored Anesthesia Care Procedure:                Pre-Anesthesia Assessment:                           - Prior to the procedure, a History and Physical                            was performed, and patient medications and                            allergies were reviewed. The patient's tolerance of                            previous anesthesia was also reviewed. The risks                            and benefits of the procedure and the sedation                            options and risks were discussed with the patient.                            All questions were answered, and informed consent                            was obtained. Prior Anticoagulants: The patient has                            taken no previous anticoagulant or antiplatelet                            agents. ASA Grade Assessment: II - A patient with                            mild systemic disease. After reviewing the risks                            and benefits, the patient was deemed in                            satisfactory condition to undergo the procedure.                           After obtaining informed consent, the endoscope was  passed under direct vision. Throughout the                            procedure, the patient's blood pressure, pulse, and                            oxygen saturations were monitored continuously. The                            Endoscope was introduced through the mouth, and                            advanced to the second part of duodenum. The upper                            GI endoscopy was accomplished  without difficulty.                            The patient tolerated the procedure well. Scope In: Scope Out: Findings:                 The examined esophagus was normal.                           A small hiatal hernia was present.                           Patchy mildly erythematous mucosa without bleeding                            was found in the cardia. Biopsies were taken with a                            cold forceps for histology.                           The exam of the stomach was otherwise normal.                           The duodenal bulb and second portion of the                            duodenum were normal. Biopsies for histology were                            taken with a cold forceps for evaluation of celiac                            disease. Complications:            No immediate complications. Estimated Blood Loss:     Estimated blood loss was minimal. Impression:               - Normal esophagus.                           -  Small hiatal hernia.                           - Mild erythematous mucosa in the cardia. Biopsied.                           - Normal duodenal bulb and second portion of the                            duodenum. Biopsied. Recommendation:           - Patient has a contact number available for                            emergencies. The signs and symptoms of potential                            delayed complications were discussed with the                            patient. Return to normal activities tomorrow.                            Written discharge instructions were provided to the                            patient.                           - Resume previous diet.                           - Continue present medications.                           - Await pathology results.                           - If biopsies do not show celiac disease recommend                            VCE. Ladene Artist, MD 12/21/2018 2:36:37 PM This  report has been signed electronically.

## 2018-12-21 NOTE — Progress Notes (Signed)
Pt's states no medical or surgical changes since previsit or office visit.  Craig Flynn

## 2018-12-21 NOTE — Progress Notes (Signed)
Called to room to assist during endoscopic procedure.  Patient ID and intended procedure confirmed with present staff. Received instructions for my participation in the procedure from the performing physician.  

## 2018-12-21 NOTE — Patient Instructions (Signed)
Handout given for Hiatal Hernia.  Await pathology results.  YOU HAD AN ENDOSCOPIC PROCEDURE TODAY AT [facility]:   Refer to the procedure report that was given to you for any specific questions about what was found during the examination.  If the procedure report does not answer your questions, please call your gastroenterologist to clarify.  If you requested that your care partner not be given the details of your procedure findings, then the procedure report has been included in a sealed envelope for you to review at your convenience later.  YOU SHOULD EXPECT: Some feelings of bloating in the abdomen. Passage of more gas than usual.  Walking can help get rid of the air that was put into your GI tract during the procedure and reduce the bloating. If you had a lower endoscopy (such as a colonoscopy or flexible sigmoidoscopy) you may notice spotting of blood in your stool or on the toilet paper. If you underwent a bowel prep for your procedure, you may not have a normal bowel movement for a few days.  Please Note:  You might notice some irritation and congestion in your nose or some drainage.  This is from the oxygen used during your procedure.  There is no need for concern and it should clear up in a day or so.  SYMPTOMS TO REPORT IMMEDIATELY:   Following lower endoscopy (colonoscopy or flexible sigmoidoscopy):  Excessive amounts of blood in the stool  Significant tenderness or worsening of abdominal pains  Swelling of the abdomen that is new, acute  Fever of 100F or higher   Following upper endoscopy (EGD)  Vomiting of blood or coffee ground material  New chest pain or pain under the shoulder blades  Painful or persistently difficult swallowing  New shortness of breath  Fever of 100F or higher  Black, tarry-looking stools  For urgent or emergent issues, a gastroenterologist can be reached at any hour by calling [phone].   DIET:  We do recommend a small meal at  first, but then you may proceed to your regular diet.  Drink plenty of fluids but you should avoid alcoholic beverages for 24 hours.  ACTIVITY:  You should plan to take it easy for the rest of today and you should NOT DRIVE or use heavy machinery until tomorrow (because of the sedation medicines used during the test).    FOLLOW UP: Our staff will call the number listed on your records 48-72 hours following your procedure to check on you and address any questions or concerns that you may have regarding the information given to you following your procedure. If we do not reach you, we will leave a message.  We will attempt to reach you two times.  During this call, we will ask if you have developed any symptoms of COVID 19. If you develop any symptoms (ie: fever, flu-like symptoms, shortness of breath, cough etc.) before then, please call [phone].  If you test positive for Covid 19 in the 2 weeks post procedure, please call and report this information to [place].    If any biopsies were taken you will be contacted by phone or by letter within the next 1-3 weeks.  Please call us at [phone] if you have not heard about the biopsies in 3 weeks.    SIGNATURES/CONFIDENTIALITY: You and/or your care partner have signed paperwork which will be entered into your electronic medical record.  These signatures attest to the fact that that the information above  on your After Visit Summary has been reviewed and is understood.  Full responsibility of the confidentiality of this discharge information lies with you and/or your care-partner.

## 2018-12-21 NOTE — Progress Notes (Signed)
To PACU, VSS. Report to [email or web address] 

## 2018-12-21 NOTE — Op Note (Signed)
Craig Flynn Patient Name: Craig Flynn Procedure Date: 12/21/2018 2:02 PM MRN: [id number] Endoscopist: Ladene Artist , MD Age: 58 Referring MD:  Date of Birth: February 14, 1961 Gender: Male Account #: [id number] Procedure:                Colonoscopy Indications:              Iron deficiency anemia. Personal history of                            adenomatous colon polyps. Medicines:                Monitored Anesthesia Care Procedure:                Pre-Anesthesia Assessment:                           - Prior to the procedure, a History and Physical                            was performed, and patient medications and                            allergies were reviewed. The patient's tolerance of                            previous anesthesia was also reviewed. The risks                            and benefits of the procedure and the sedation                            options and risks were discussed with the patient.                            All questions were answered, and informed consent                            was obtained. Prior Anticoagulants: The patient has                            taken no previous anticoagulant or antiplatelet                            agents. ASA Grade Assessment: II - A patient with                            mild systemic disease. After reviewing the risks                            and benefits, the patient was deemed in                            satisfactory condition to undergo the procedure.  After obtaining informed consent, the colonoscope                            was passed under direct vision. Throughout the                            procedure, the patient's blood pressure, pulse, and                            oxygen saturations were monitored continuously. The                            Colonoscope was introduced through the anus and                            advanced to the the terminal ileum, with                           identification of the appendiceal orifice and IC                            valve. The terminal ileum, ileocecal valve,                            appendiceal orifice, and rectum were photographed.                            The quality of the bowel preparation was good. The                            colonoscopy was performed without difficulty. The                            patient tolerated the procedure well. Scope In: 2:08:00 PM Scope Out: 2:20:04 PM Scope Withdrawal Time: 0 hours 10 minutes 11 seconds  Total Procedure Duration: 0 hours 12 minutes 4 seconds  Findings:                 The perianal and digital rectal examinations were                            normal.                           The terminal ileum appeared normal.                           The entire examined colon appeared normal on direct                            and retroflexion views. Complications:            No immediate complications. Estimated blood loss:                            None. Estimated Blood Loss:  Estimated blood loss: none. Impression:               - The examined portion of the ileum was normal.                           - The entire examined colon is normal on direct and                            retroflexion views.                           - No specimens collected. Recommendation:           - Repeat colonoscopy in 7 years for surveillance.                           - Patient has a contact number available for                            emergencies. The signs and symptoms of potential                            delayed complications were discussed with the                            patient. Return to normal activities tomorrow.                            Written discharge instructions were provided to the                            patient.                           - Resume previous diet.                           - Continue present medications. Ladene Artist, MD 12/21/2018 2:23:32 PM This report has been signed electronically.

## 2018-12-23 ENCOUNTER — Telehealth: Payer: Self-pay

## 2018-12-23 NOTE — Telephone Encounter (Signed)
  Follow up Call-  Call back number 12/21/2018  Post procedure Call Back phone  # [phone]  Permission to leave phone message Yes  Some recent data might be hidden     Patient questions:  Do you have a fever, pain , or abdominal swelling? No. Pain Score  0 *  Have you tolerated food without any problems? Yes.    Have you been able to return to your normal activities? Yes.    Do you have any questions about your discharge instructions: Diet   No. Medications  No. Follow up visit  No.  Do you have questions or concerns about your Care? No.  Actions: * If pain score is 4 or above: No action needed, pain <4. 1. Have you developed a fever since your procedure? no  2.   Have you had an respiratory symptoms (SOB or cough) since your procedure? no  3.   Have you tested positive for COVID 19 since your procedure no  4.   Have you had any family members/close contacts diagnosed with the COVID 19 since your procedure?  no   If yes to any of these questions please route to Joylene John, RN and Alphonsa Gin, Therapist, sports.

## 2018-12-27 ENCOUNTER — Encounter: Payer: Self-pay | Admitting: Gastroenterology

## 2019-02-20 DIAGNOSIS — M109 Gout, unspecified: Secondary | ICD-10-CM | POA: Diagnosis not present

## 2019-02-20 DIAGNOSIS — E611 Iron deficiency: Secondary | ICD-10-CM | POA: Diagnosis not present

## 2019-02-20 DIAGNOSIS — I120 Hypertensive chronic kidney disease with stage 5 chronic kidney disease or end stage renal disease: Secondary | ICD-10-CM | POA: Diagnosis not present

## 2019-02-20 DIAGNOSIS — D631 Anemia in chronic kidney disease: Secondary | ICD-10-CM | POA: Diagnosis not present

## 2019-02-20 DIAGNOSIS — N189 Chronic kidney disease, unspecified: Secondary | ICD-10-CM | POA: Diagnosis not present

## 2019-02-20 DIAGNOSIS — I770 Arteriovenous fistula, acquired: Secondary | ICD-10-CM | POA: Diagnosis not present

## 2019-02-20 DIAGNOSIS — E876 Hypokalemia: Secondary | ICD-10-CM | POA: Diagnosis not present

## 2019-02-20 DIAGNOSIS — E872 Acidosis: Secondary | ICD-10-CM | POA: Diagnosis not present

## 2019-02-20 DIAGNOSIS — N185 Chronic kidney disease, stage 5: Secondary | ICD-10-CM | POA: Diagnosis not present

## 2019-02-20 DIAGNOSIS — N2581 Secondary hyperparathyroidism of renal origin: Secondary | ICD-10-CM | POA: Diagnosis not present

## 2019-02-22 ENCOUNTER — Ambulatory Visit (INDEPENDENT_AMBULATORY_CARE_PROVIDER_SITE_OTHER): Payer: Medicare HMO | Admitting: Internal Medicine

## 2019-02-22 ENCOUNTER — Encounter: Payer: Self-pay | Admitting: Internal Medicine

## 2019-02-22 ENCOUNTER — Other Ambulatory Visit: Payer: Self-pay

## 2019-02-22 VITALS — BP 126/70 | HR 85 | Temp 98.6°F | Ht 74.0 in | Wt 307.0 lb

## 2019-02-22 DIAGNOSIS — E785 Hyperlipidemia, unspecified: Secondary | ICD-10-CM | POA: Diagnosis not present

## 2019-02-22 DIAGNOSIS — Z0000 Encounter for general adult medical examination without abnormal findings: Secondary | ICD-10-CM

## 2019-02-22 DIAGNOSIS — E119 Type 2 diabetes mellitus without complications: Secondary | ICD-10-CM

## 2019-02-22 DIAGNOSIS — R7989 Other specified abnormal findings of blood chemistry: Secondary | ICD-10-CM | POA: Diagnosis not present

## 2019-02-22 DIAGNOSIS — R252 Cramp and spasm: Secondary | ICD-10-CM | POA: Diagnosis not present

## 2019-02-22 DIAGNOSIS — N184 Chronic kidney disease, stage 4 (severe): Secondary | ICD-10-CM | POA: Diagnosis not present

## 2019-02-22 DIAGNOSIS — K219 Gastro-esophageal reflux disease without esophagitis: Secondary | ICD-10-CM | POA: Diagnosis not present

## 2019-02-22 DIAGNOSIS — E611 Iron deficiency: Secondary | ICD-10-CM | POA: Diagnosis not present

## 2019-02-22 DIAGNOSIS — I10 Essential (primary) hypertension: Secondary | ICD-10-CM | POA: Diagnosis not present

## 2019-02-22 DIAGNOSIS — E559 Vitamin D deficiency, unspecified: Secondary | ICD-10-CM | POA: Insufficient documentation

## 2019-02-22 DIAGNOSIS — E538 Deficiency of other specified B group vitamins: Secondary | ICD-10-CM

## 2019-02-22 MED ORDER — PANTOPRAZOLE SODIUM 40 MG PO TBEC: 40.0000 mg | DELAYED_RELEASE_TABLET | Freq: Every day | ORAL | 3 refills | Status: DC

## 2019-02-22 NOTE — Progress Notes (Signed)
Subjective:    Patient ID: Craig Flynn, male    DOB: 1960-06-30, 58 y.o.   MRN: [id number]  HPI  Here to f/u; overall doing ok,  Pt denies chest pain, increasing sob or doe, wheezing, orthopnea, PND, increased LE swelling, palpitations, dizziness or syncope.  Pt denies new neurological symptoms such as new headache, or facial or extremity weakness or numbness.  Pt denies polydipsia, polyuria, or low sugar episode.  Pt states overall good compliance with meds, mostly trying to follow appropriate diet, with wt overall stable,  but little exercise however.  Right handed  Writes a lot Has Right hand cramping for 1 wk mild intermittent, seemed better with mustard but 2 days , saw renal yesterday with stable labs Ok fo muscle relaxer  No other new complaints.  Has 2 mo mild worsening reflux, without abd pain, dysphagia, n/v, bowel change or blood.  Denies hyper or hypo thyroid symptoms such as voice, skin or hair change. Past Medical History:  Diagnosis Date  . Anemia 06/2015  . Arthritis    knee  . Cancer [facility])    thyroid cancer  . Cardiomyopathy ([place])    a. 10/2013: EF reduced to 35-40% b. 09/2014: EF improved to 55-60%, Grade 1 DD noted.  . Chest pain 06/2015  . CHF (congestive heart failure) ([place])   . CKD (chronic kidney disease), stage IV [facility])    sees  Dr Lorrene Reid  . Dyspnea    with exerion   . GERD (gastroesophageal reflux disease)   . Gout   . Hypertension   . Obesity   . Sleep apnea    not wearing c-pap now, needs new slep study per pt. (01/22/2017)  . Tuberculosis 1981   while the pt was in the service.  . Tubular adenoma of colon 10/2015  . Wears glasses    Past Surgical History:  Procedure Laterality Date  . AV FISTULA PLACEMENT Left 03/25/2016   Procedure: Creation of Left Arm ARTERIOVENOUS (AV) FISTULA;  Surgeon: Elam Dutch, MD;  Location: [facility] OR;  Service: Vascular;  Laterality: Left;  . COLONOSCOPY W/ BIOPSIES AND POLYPECTOMY     "no problem"  . HERNIA REPAIR    .  LAPAROSCOPIC CHOLECYSTECTOMY    . LIGATION OF COMPETING BRANCHES OF ARTERIOVENOUS FISTULA Left 01/25/2018   Procedure: LIGATION OF COMPETING BRANCHES And Revision of ARTERIOVENOUS FISTULA LEFT ARM.;  Surgeon: Elam Dutch, MD;  Location: [place];  Service: Vascular;  Laterality: Left;  . THYROIDECTOMY  01/22/2017  . THYROIDECTOMY Left 01/22/2017   Procedure: THYROIDECTOMY;  Surgeon: Melida Quitter, MD;  Location: [place];  Service: ENT;  Laterality: Left;  . THYROIDECTOMY Right 03/26/2017   Procedure: COMPLETION OF THYROIDECTOMY;  Surgeon: Melida Quitter, MD;  Location: [place];  Service: ENT;  Laterality: Right;  . UMBILICAL HERNIA REPAIR    . WISDOM TOOTH EXTRACTION      reports that he has been smoking cigarettes. He has a 20.00 pack-year smoking history. He has never used smokeless tobacco. He reports previous alcohol use. He reports that he does not use drugs. family history includes Healthy in his maternal grandfather, paternal grandfather, and paternal grandmother; Hypertension in his maternal grandmother; Kidney disease in his maternal grandmother; Pneumonia in his father; Stroke in his mother. No Known Allergies Current Outpatient Medications on File Prior to Visit  Medication Sig Dispense Refill  . acetaminophen (TYLENOL) 500 MG tablet Take 2 tablets (1,000 mg total) by mouth every 8 (eight) hours as needed  for mild pain or headache. 60 tablet 0  . allopurinol (ZYLOPRIM) 100 MG tablet Take 100 mg by mouth daily.     Marland Kitchen aspirin EC 81 MG tablet Take 81 mg by mouth daily.    . calcitRIOL (ROCALTROL) 0.25 MCG capsule Take 1 capsule by mouth 3 (three) times a week.  5  . carvedilol (COREG) 25 MG tablet TAKE 1 TABLET BY MOUTH TWICE DAILY 180 tablet 2  . carvedilol (COREG) 25 MG tablet Take 1 tablet (25 mg total) by mouth 2 (two) times daily. 180 tablet 2  . fenofibrate 54 MG tablet TAKE 1 TABLET BY MOUTH ONCE DAILY (Patient taking differently: Take 54 mg by mouth daily. ) 90 tablet 3  . furosemide  (LASIX) 80 MG tablet Take 1 tablet (80 mg total) by mouth 2 (two) times daily. (Patient taking differently: Take 160 mg by mouth 2 (two) times daily. Morning & afternoon) 60 tablet 1  . iron polysaccharides (NU-IRON) 150 MG capsule Take 1 capsule (150 mg total) by mouth daily. 90 capsule 0  . isosorbide mononitrate (IMDUR) 30 MG 24 hr tablet TAKE 2 TABLETS(60 MG) BY MOUTH DAILY (Patient taking differently: Take 60 mg by mouth daily. ) 60 tablet 5  . levothyroxine (SYNTHROID) 150 MCG tablet Take 150 mcg by mouth daily.    . nitroGLYCERIN (NITROSTAT) 0.4 MG SL tablet Place 1 tablet (0.4 mg total) under the tongue every 5 (five) minutes as needed for chest pain. (Patient taking differently: Place 0.4 mg under the tongue every 5 (five) minutes as needed for chest pain. ) 25 tablet 3  . potassium chloride SA (K-DUR,KLOR-CON) 20 MEQ tablet Take 20 mEq by mouth daily.     . predniSONE (DELTASONE) 10 MG tablet 3 tabs by mouth per day for 3 days,2tabs per day for 3 days,1tab per day for 3 days 18 tablet 0  . Vitamin D, Ergocalciferol, (DRISDOL) 1.25 MG (50000 UT) CAPS capsule Take 1 capsule (50,000 Units total) by mouth every 7 (seven) days. 12 capsule 0  . levothyroxine (SYNTHROID) 200 MCG tablet Take 1 tablet (200 mcg total) by mouth daily. (Patient not taking: Reported on 10/28/2018) 30 tablet 11   No current facility-administered medications on file prior to visit.    Review of Systems  Constitutional: Negative for other unusual diaphoresis or sweats HENT: Negative for ear discharge or swelling Eyes: Negative for other worsening visual disturbances Respiratory: Negative for stridor or other swelling  Gastrointestinal: Negative for worsening distension or other blood Genitourinary: Negative for retention or other urinary change Musculoskeletal: Negative for other MSK pain or swelling Skin: Negative for color change or other new lesions Neurological: Negative for worsening tremors and other numbness   Psychiatric/Behavioral: Negative for worsening agitation or other fatigue All otherwise neg per pt     Objective:   Physical Exam BP 126/70   Pulse 85   Temp 98.6 F (37 C) (Oral)   Ht 6\' 2"  (1.88 m)   Wt (!) 307 lb (139.3 kg)   SpO2 99%   BMI 39.42 kg/m  VS noted,  Constitutional: Pt appears in NAD HENT: Head: NCAT.  Right Ear: External ear normal.  Left Ear: External ear normal.  Eyes: . Pupils are equal, round, and reactive to light. Conjunctivae and EOM are normal Nose: without d/c or deformity Neck: Neck supple. Gross normal ROM Cardiovascular: Normal rate and regular rhythm.   Pulmonary/Chest: Effort normal and breath sounds without rales or wheezing.  Abd:  Soft, NT, ND, +  BS, no organomegaly Neurological: Pt is alert. At baseline orientation, motor grossly intact Skin: Skin is warm. No rashes, other new lesions, no LE edema Psychiatric: Pt behavior is normal without agitation  All otherwise neg per pt Lab Results  Component Value Date   WBC 6.0 10/28/2018   HGB 11.7 (L) 10/28/2018   HCT 38.1 (L) 10/28/2018   PLT 214 10/28/2018   GLUCOSE 103 (H) 10/28/2018   CHOL 164 10/14/2018   TRIG 182.0 (H) 10/14/2018   HDL 31.10 (L) 10/14/2018   LDLCALC 96 10/14/2018   ALT 16 10/14/2018   AST 15 10/14/2018   NA 140 10/28/2018   K 4.2 10/28/2018   CL 104 10/28/2018   CREATININE 5.53 (H) 10/28/2018   BUN 49 (H) 10/28/2018   CO2 24 10/28/2018   TSH 0.03 (L) 10/14/2018   PSA 2.01 10/14/2018   HGBA1C 6.3 10/14/2018      Assessment & Plan:

## 2019-02-22 NOTE — Patient Instructions (Signed)
Please continue all other medications as before, and refills have been done if requested - the protonix  Please have the pharmacy call with any other refills you may need.  Please continue your efforts at being more active, low cholesterol diet, and weight control.  Please keep your appointments with your specialists as you may have planned  Please return in 6 months, or sooner if needed, with Lab testing done 3-5 days before

## 2019-02-26 ENCOUNTER — Encounter: Payer: Self-pay | Admitting: Internal Medicine

## 2019-02-26 DIAGNOSIS — R252 Cramp and spasm: Secondary | ICD-10-CM | POA: Insufficient documentation

## 2019-02-26 DIAGNOSIS — R7989 Other specified abnormal findings of blood chemistry: Secondary | ICD-10-CM | POA: Insufficient documentation

## 2019-02-26 NOTE — Assessment & Plan Note (Signed)
Cont oral replacement, f/u lab 

## 2019-02-26 NOTE — Assessment & Plan Note (Signed)
For protonix 40 qd ?

## 2019-02-26 NOTE — Assessment & Plan Note (Signed)
For muscle relaxer prn

## 2019-02-26 NOTE — Assessment & Plan Note (Signed)
?   For endo referral, asympt, to check repeat labs

## 2019-02-26 NOTE — Assessment & Plan Note (Signed)
Continue oral Vit D,  to f/u any worsening symptoms or concerns

## 2019-02-26 NOTE — Assessment & Plan Note (Addendum)
A1c was 6.9 yesterday by report, stable overall by history and exam, recent data reviewed with pt, and pt to continue medical treatment as before,  to f/u any worsening symptoms or concerns

## 2019-02-26 NOTE — Assessment & Plan Note (Addendum)
stable overall by history and exam, recent data reviewed with pt, and pt to continue medical treatment as before,  to f/u any worsening symptoms or concerns  Note:  Total time for pt hx, exam, review of record with pt in the room, determination of diagnoses and plan for further eval and tx is > 40 min, with over 50% spent in coordination and counseling of patient including the differential dx, tx, further evaluation and other management of CKD, HTn, HLD, DM, GERD, abnormal TSH, muscle cramps, Vit D deficiency, iron deficiency

## 2019-02-26 NOTE — Assessment & Plan Note (Signed)
stable overall by history and exam, recent data reviewed with pt, and pt to continue medical treatment as before,  to f/u any worsening symptoms or concerns  

## 2019-02-27 ENCOUNTER — Ambulatory Visit (INDEPENDENT_AMBULATORY_CARE_PROVIDER_SITE_OTHER)
Admission: EM | Admit: 2019-02-27 | Discharge: 2019-02-27 | Disposition: A | Discharge status: Home / Self Care | Payer: Medicare HMO | Source: Home / Self Care

## 2019-02-27 ENCOUNTER — Emergency Department (HOSPITAL_COMMUNITY): Payer: Medicare HMO

## 2019-02-27 ENCOUNTER — Encounter (HOSPITAL_COMMUNITY): Payer: Self-pay | Admitting: Emergency Medicine

## 2019-02-27 ENCOUNTER — Other Ambulatory Visit: Payer: Self-pay

## 2019-02-27 ENCOUNTER — Encounter: Payer: Self-pay | Admitting: *Deleted

## 2019-02-27 ENCOUNTER — Ambulatory Visit: Payer: Self-pay

## 2019-02-27 ENCOUNTER — Emergency Department (HOSPITAL_COMMUNITY)
Admission: EM | Admit: 2019-02-27 | Discharge: 2019-02-27 | Disposition: A | Discharge status: Home / Self Care | Payer: Medicare HMO | Attending: Emergency Medicine | Admitting: Emergency Medicine

## 2019-02-27 DIAGNOSIS — Z5321 Procedure and treatment not carried out due to patient leaving prior to being seen by health care provider: Secondary | ICD-10-CM | POA: Diagnosis not present

## 2019-02-27 DIAGNOSIS — R079 Chest pain, unspecified: Secondary | ICD-10-CM | POA: Insufficient documentation

## 2019-02-27 LAB — BASIC METABOLIC PANEL
Anion gap: 11 (ref 5–15)
BUN: 37 mg/dL — ABNORMAL HIGH (ref 6–20)
CO2: 28 mmol/L (ref 22–32)
Calcium: 9.7 mg/dL (ref 8.9–10.3)
Chloride: 102 mmol/L (ref 98–111)
Creatinine, Ser: 5.81 mg/dL — ABNORMAL HIGH (ref 0.61–1.24)
GFR calc Af Amer: 11 mL/min — ABNORMAL LOW (ref >60)
GFR calc non Af Amer: 10 mL/min — ABNORMAL LOW (ref >60)
Glucose, Bld: 92 mg/dL (ref 70–99)
Potassium: 3.9 mmol/L (ref 3.5–5.1)
Sodium: 141 mmol/L (ref 135–145)

## 2019-02-27 LAB — CBC
HCT: 38.7 % — ABNORMAL LOW (ref 39.0–52.0)
Hemoglobin: 11.8 g/dL — ABNORMAL LOW (ref 13.0–17.0)
MCH: 27.1 pg (ref 26.0–34.0)
MCHC: 30.5 g/dL (ref 30.0–36.0)
MCV: 89 fL (ref 80.0–100.0)
Platelets: 202 10*3/uL (ref 150–400)
RBC: 4.35 MIL/uL (ref 4.22–5.81)
RDW: 13.5 % (ref 11.5–15.5)
WBC: 5.9 10*3/uL (ref 4.0–10.5)
nRBC: 0 % (ref 0.0–0.2)

## 2019-02-27 LAB — HEPATIC FUNCTION PANEL
ALT: 17 U/L (ref 0–44)
AST: 17 U/L (ref 15–41)
Albumin: 4.1 g/dL (ref 3.5–5.0)
Alkaline Phosphatase: 43 U/L (ref 38–126)
Bilirubin, Direct: <0.1 mg/dL (ref 0.0–0.2)
Total Bilirubin: 0.6 mg/dL (ref 0.3–1.2)
Total Protein: 6.8 g/dL (ref 6.5–8.1)

## 2019-02-27 LAB — LIPASE, BLOOD: Lipase: 51 U/L (ref 11–51)

## 2019-02-27 LAB — TROPONIN I (HIGH SENSITIVITY): Troponin I (High Sensitivity): 19 ng/L — ABNORMAL HIGH (ref <18)

## 2019-02-27 MED ORDER — SODIUM CHLORIDE 0.9% FLUSH: 3.0000 mL | Freq: Once | INTRAVENOUS | Status: DC

## 2019-02-27 NOTE — ED Triage Notes (Signed)
Pt. States he started feeling sharp chest pains 50 mins & an EKG was completed and it was determined he should go to the ED immediately.  At 3:10pm he had another chest pain & rt. Hand cramping.  At 3:12pm he was taken to ED.

## 2019-02-27 NOTE — ED Triage Notes (Addendum)
Patient is being discharged from the Urgent [place] and sent to the Emergency Department via wheelchair by staff. Per Dr. Meda Coffee, patient is stable but in need of higher level of care due to severe sharp right sided cp that begun today. Pt has lengthy medical history. Patient is aware and verbalizes understanding of plan of care. VSS.

## 2019-02-27 NOTE — Progress Notes (Signed)
Error

## 2019-02-27 NOTE — Telephone Encounter (Signed)
Incoming call from Patient  With a complaint of having cramps in hand.  That leads up chest Pain.   Onset 2weeks ago.   Lasting  5 minutes Rated  Moderate.   Was recently hospitalized for congestive heart failure.   States he is a little dizzy Reviewed protocol.  Recommended that Patient that patient go to Urgent care, for further evaluation.  Voiced understanding.  States he will go to Urgent Care.          Reason for Disposition . Dizziness or lightheadedness  Answer Assessment - Initial Assessment Questions 1. LOCATION: "Where does it hurt?"       Right hand 2. RADIATION: "Does the pain go anywhere else?" (e.g., into neck, jaw, arms, back)     heart 3. ONSET: "When did the chest pain begin?" (Minutes, hours or days)      2 weeksPATTERN "Does the pain come and go, or has it been constant since it started?"  "Does it get worse with exertion?"      Comes and goes 5. DURATION: "How long does it last" (e.g., seconds, minutes, hours)   Five minutes 6. SEVERITY: "How bad is the pain?"  (e.g., Scale 1-10; mild, moderate, or severe)    - MILD (1-3): doesn't interfere with normal activities     - MODERATE (4-7): interferes with normal activities or awakens from sleep    - SEVERE (8-10): excruciating pain, unable to do any normal activities     moderate 7. CARDIAC RISK FACTORS: "Do you have any history of heart problems or risk factors for heart disease?" (e.g., angina, prior heart attack; diabetes, high blood pressure, high cholesterol, smoker, or strong family history of heart disease)    Out of congestive heart failure 8. PULMONARY RISK FACTORS: "Do you have any history of lung disease?"  (e.g., blood clots in lung, asthma, emphysema, birth control pills)     denies 9. CAUSE: "What do you think is causing the chest pain?"     *No Answer* 10. OTHER SYMPTOMS: "Do you have any other symptoms?" (e.g., dizziness, nausea, vomiting, sweating, fever, difficulty breathing, cough)       *No  Answer*light headed a little bit 11. PREGNANCY: "Is there any chance you are pregnant?" "When was your last menstrual period?"      na  Protocols used: CHEST PAIN-A-AH

## 2019-02-27 NOTE — ED Triage Notes (Signed)
Pt arrives to ED with c/c of chest pain that started suddenly while driving- Pt was sent down by UC for further work up. Pt states it started in his right hand and moved into middle of chest.

## 2019-03-05 ENCOUNTER — Other Ambulatory Visit: Payer: Self-pay | Admitting: Cardiology

## 2019-03-27 ENCOUNTER — Other Ambulatory Visit: Payer: Self-pay | Admitting: Cardiology

## 2019-04-12 DIAGNOSIS — E876 Hypokalemia: Secondary | ICD-10-CM | POA: Diagnosis not present

## 2019-04-12 DIAGNOSIS — E611 Iron deficiency: Secondary | ICD-10-CM | POA: Diagnosis not present

## 2019-04-12 DIAGNOSIS — N2581 Secondary hyperparathyroidism of renal origin: Secondary | ICD-10-CM | POA: Diagnosis not present

## 2019-04-12 DIAGNOSIS — C73 Malignant neoplasm of thyroid gland: Secondary | ICD-10-CM | POA: Diagnosis not present

## 2019-04-12 DIAGNOSIS — N189 Chronic kidney disease, unspecified: Secondary | ICD-10-CM | POA: Diagnosis not present

## 2019-04-12 DIAGNOSIS — I770 Arteriovenous fistula, acquired: Secondary | ICD-10-CM | POA: Diagnosis not present

## 2019-04-12 DIAGNOSIS — M109 Gout, unspecified: Secondary | ICD-10-CM | POA: Diagnosis not present

## 2019-04-12 DIAGNOSIS — I120 Hypertensive chronic kidney disease with stage 5 chronic kidney disease or end stage renal disease: Secondary | ICD-10-CM | POA: Diagnosis not present

## 2019-04-12 DIAGNOSIS — N185 Chronic kidney disease, stage 5: Secondary | ICD-10-CM | POA: Diagnosis not present

## 2019-04-12 DIAGNOSIS — D631 Anemia in chronic kidney disease: Secondary | ICD-10-CM | POA: Diagnosis not present

## 2019-04-18 ENCOUNTER — Ambulatory Visit: Payer: Medicare HMO | Admitting: Internal Medicine

## 2019-04-18 DIAGNOSIS — Z0289 Encounter for other administrative examinations: Secondary | ICD-10-CM

## 2019-04-25 ENCOUNTER — Encounter: Payer: Self-pay | Admitting: Internal Medicine

## 2019-04-25 ENCOUNTER — Other Ambulatory Visit: Payer: Self-pay

## 2019-04-25 ENCOUNTER — Ambulatory Visit (INDEPENDENT_AMBULATORY_CARE_PROVIDER_SITE_OTHER): Payer: Medicare HMO | Admitting: Internal Medicine

## 2019-04-25 DIAGNOSIS — I10 Essential (primary) hypertension: Secondary | ICD-10-CM

## 2019-04-25 DIAGNOSIS — N184 Chronic kidney disease, stage 4 (severe): Secondary | ICD-10-CM | POA: Diagnosis not present

## 2019-04-25 DIAGNOSIS — E119 Type 2 diabetes mellitus without complications: Secondary | ICD-10-CM

## 2019-04-25 DIAGNOSIS — G5621 Lesion of ulnar nerve, right upper limb: Secondary | ICD-10-CM | POA: Diagnosis not present

## 2019-04-25 NOTE — Patient Instructions (Signed)
You appear to have mild right sided ulnar neuritis  Please avoid pressure to the elbow or bending it for prolonged period of time  You may even want to try topical voltaren gel to the inside of the right elbow  Please continue all other medications as before, and refills have been done if requested.  Please have the pharmacy call with any other refills you may need.  Please continue your efforts at being more active, low cholesterol diet, and weight control.  Please keep your appointments with your specialists as you may have planned

## 2019-04-25 NOTE — Assessment & Plan Note (Addendum)
Mild, declines specific tx or NCS/EMG or NS referral,  to f/u any worsening symptoms or concerns  I spent 31 minutes preparing to see the patient by review of recent labs, imaging and procedures, obtaining and reviewing separately obtained history, communicating with the patient and family or caregiver, ordering medications, tests or procedures, and documenting clinical information in the EHR including the differential Dx, treatment, and any further evaluation and other management of right ulnar neuritis, HTN, DM, CKD

## 2019-04-25 NOTE — Assessment & Plan Note (Signed)
stable overall by history and exam, recent data reviewed with pt, and pt to continue medical treatment as before,  to f/u any worsening symptoms or concerns  

## 2019-04-25 NOTE — Progress Notes (Signed)
Subjective:    Patient ID: Craig Flynn, male    DOB: Nov 12, 1960, 59 y.o.   MRN: [id number]  HPI  Here to f/u; overall doing ok,  Pt denies chest pain, increasing sob or doe, wheezing, orthopnea, PND, increased LE swelling, palpitations, dizziness or syncope  Pt denies polydipsia, polyuria, or low sugar episode.  Pt states overall good compliance with meds, mostly trying to follow appropriate diet, Saw renal, last wk no need for HD yet. 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 Anemia 06/2015  . Arthritis    knee  . Cancer [facility])    thyroid cancer  . Cardiomyopathy ([place])    a. 10/2013: EF reduced to 35-40% b. 09/2014: EF improved to 55-60%, Grade 1 DD noted.  . Chest pain 06/2015  . CHF (congestive heart failure) ([place])   . CKD (chronic kidney disease), stage IV [facility])    sees  Dr Lorrene Reid  . Dyspnea    with exerion   . GERD (gastroesophageal reflux disease)   . Gout   . Hypertension   . Obesity   . Sleep apnea    not wearing c-pap now, needs new slep study per pt. (01/22/2017)  . Tuberculosis 1981   while the pt was in the service.  . Tubular adenoma of colon 10/2015  . Wears glasses    Past Surgical History:  Procedure Laterality Date  . AV FISTULA PLACEMENT Left 03/25/2016   Procedure: Creation of Left Arm ARTERIOVENOUS (AV) FISTULA;  Surgeon: Elam Dutch, MD;  Location[facility];  Service: Vascular;  Laterality: Left;  . COLONOSCOPY W/ BIOPSIES AND POLYPECTOMY     "no problem"  . HERNIA REPAIR    . LAPAROSCOPIC CHOLECYSTECTOMY    . LIGATION OF COMPETING BRANCHES OF ARTERIOVENOUS FISTULA Left 01/25/2018   Procedure: LIGATION OF COMPETING BRANCHES And Revision of ARTERIOVENOUS FISTULA LEFT ARM.;  Surgeon: Elam Dutch, MD;  Location: [place];  Service: Vascular;  Laterality: Left;  . THYROIDECTOMY   01/22/2017  . THYROIDECTOMY Left 01/22/2017   Procedure: THYROIDECTOMY;  Surgeon: Melida Quitter, MD;  Location: [place];  Service: ENT;  Laterality: Left;  . THYROIDECTOMY Right 03/26/2017   Procedure: COMPLETION OF THYROIDECTOMY;  Surgeon: Melida Quitter, MD;  Location: [place];  Service: ENT;  Laterality: Right;  . UMBILICAL HERNIA REPAIR    . WISDOM TOOTH EXTRACTION      reports that he has been smoking cigarettes. He has a 20.00 pack-year smoking history. He has never used smokeless tobacco. He reports previous alcohol use. He reports that he does not use drugs. family history includes Healthy in his maternal grandfather, paternal grandfather, and paternal grandmother; Hypertension in his maternal grandmother; Kidney disease in his maternal grandmother; Pneumonia in his father; Stroke in his mother. No Known Allergies Current Outpatient Medications on File Prior to Visit  Medication Sig Dispense Refill  . acetaminophen (TYLENOL) 500 MG tablet Take 2 tablets (1,000 mg total) by mouth every 8 (eight) hours as needed for mild pain or headache. 60 tablet 0  . allopurinol (ZYLOPRIM) 100 MG tablet Take 100 mg by mouth daily.     Marland Kitchen aspirin EC 81 MG tablet Take 81 mg by mouth daily.    . calcitRIOL (ROCALTROL) 0.5 MCG capsule Take 0.5 mcg by mouth every Monday, Wednesday,  and Friday.    . carvedilol (COREG) 25 MG tablet TAKE 1 TABLET BY MOUTH TWICE DAILY 180 tablet 2  . fenofibrate 54 MG tablet TAKE 1 TABLET BY MOUTH EVERY DAY 90 tablet 1  . furosemide (LASIX) 80 MG tablet Take 1 tablet (80 mg total) by mouth 2 (two) times daily. (Patient taking differently: Take 160 mg by mouth 2 (two) times daily. Morning & afternoon) 60 tablet 1  . iron polysaccharides (NU-IRON) 150 MG capsule Take 1 capsule (150 mg total) by mouth daily. 90 capsule 0  . isosorbide mononitrate (IMDUR) 30 MG 24 hr tablet TAKE 2 TABLETS(60 MG) BY MOUTH DAILY (Patient taking differently: Take 60 mg by mouth daily. ) 60 tablet 5  .  levothyroxine (SYNTHROID) 150 MCG tablet Take 150 mcg by mouth daily.    . nitroGLYCERIN (NITROSTAT) 0.4 MG SL tablet DISSOLVE 1 TABLET UNDER THE TONGUE EVERY 5 MINUTES AS NEEDED FOR CHEST PAIN 25 tablet 3  . pantoprazole (PROTONIX) 40 MG tablet Take 1 tablet (40 mg total) by mouth daily. 90 tablet 3  . potassium chloride SA (K-DUR,KLOR-CON) 20 MEQ tablet Take 20 mEq by mouth daily.     . sodium bicarbonate 650 MG tablet Take 650 mg by mouth 3 (three) times daily.    . Vitamin D, Ergocalciferol, (DRISDOL) 1.25 MG (50000 UT) CAPS capsule Take 1 capsule (50,000 Units total) by mouth every 7 (seven) days. 12 capsule 0   No current facility-administered medications on file prior to visit.   Review of Systems All otherwise neg per pt     Objective:   Physical Exam BP 140/74   Pulse 72   Temp 98.6 F (37 C)   Ht 6\' 2"  (1.88 m)   Wt (!) 302 lb 3.2 oz (137.1 kg)   SpO2 99%   BMI 38.80 kg/m  VS noted,  Constitutional: Pt appears in NAD HENT: Head: NCAT.  Right Ear: External ear normal.  Left Ear: External ear normal.  Eyes: . Pupils are equal, round, and reactive to light. Conjunctivae and EOM are normal Nose: without d/c or deformity Neck: Neck supple. Gross normal ROM Cardiovascular: Normal rate and regular rhythm.   Pulmonary/Chest: Effort normal and breath sounds without rales or wheezing.  Abd:  Soft, NT, ND, + BS, no organomegaly Neurological: Pt is alert. At baseline orientation, motor grossly intact Skin: Skin is warm. No rashes, other new lesions, no LE edema Psychiatric: Pt behavior is normal without agitation  All otherwise neg per pt  Lab Results  Component Value Date   WBC 5.9 02/27/2019   HGB 11.8 (L) 02/27/2019   HCT 38.7 (L) 02/27/2019   PLT 202 02/27/2019   GLUCOSE 92 02/27/2019   CHOL 164 10/14/2018   TRIG 182.0 (H) 10/14/2018   HDL 31.10 (L) 10/14/2018   LDLCALC 96 10/14/2018   ALT 17 02/27/2019   AST 17 02/27/2019   NA 141 02/27/2019   K 3.9 02/27/2019    CL 102 02/27/2019   CREATININE 5.81 (H) 02/27/2019   BUN 37 (H) 02/27/2019   CO2 28 02/27/2019   TSH 0.03 (L) 10/14/2018   PSA 2.01 10/14/2018   HGBA1C 6.3 10/14/2018       Assessment & Plan:

## 2019-05-04 ENCOUNTER — Encounter (HOSPITAL_COMMUNITY): Payer: Self-pay

## 2019-05-04 ENCOUNTER — Emergency Department (HOSPITAL_COMMUNITY)
Admission: EM | Admit: 2019-05-04 | Discharge: 2019-05-04 | Disposition: A | Discharge status: Home / Self Care | Payer: Medicare HMO | Attending: Emergency Medicine | Admitting: Emergency Medicine

## 2019-05-04 ENCOUNTER — Emergency Department (HOSPITAL_COMMUNITY): Payer: Medicare HMO

## 2019-05-04 ENCOUNTER — Other Ambulatory Visit: Payer: Self-pay

## 2019-05-04 DIAGNOSIS — N184 Chronic kidney disease, stage 4 (severe): Secondary | ICD-10-CM | POA: Insufficient documentation

## 2019-05-04 DIAGNOSIS — I5032 Chronic diastolic (congestive) heart failure: Secondary | ICD-10-CM | POA: Diagnosis not present

## 2019-05-04 DIAGNOSIS — I130 Hypertensive heart and chronic kidney disease with heart failure and stage 1 through stage 4 chronic kidney disease, or unspecified chronic kidney disease: Secondary | ICD-10-CM | POA: Insufficient documentation

## 2019-05-04 DIAGNOSIS — Z85850 Personal history of malignant neoplasm of thyroid: Secondary | ICD-10-CM | POA: Insufficient documentation

## 2019-05-04 DIAGNOSIS — E1122 Type 2 diabetes mellitus with diabetic chronic kidney disease: Secondary | ICD-10-CM | POA: Diagnosis not present

## 2019-05-04 DIAGNOSIS — Z7982 Long term (current) use of aspirin: Secondary | ICD-10-CM | POA: Diagnosis not present

## 2019-05-04 DIAGNOSIS — R0789 Other chest pain: Secondary | ICD-10-CM | POA: Insufficient documentation

## 2019-05-04 DIAGNOSIS — F17210 Nicotine dependence, cigarettes, uncomplicated: Secondary | ICD-10-CM | POA: Insufficient documentation

## 2019-05-04 DIAGNOSIS — R079 Chest pain, unspecified: Secondary | ICD-10-CM | POA: Diagnosis not present

## 2019-05-04 DIAGNOSIS — Z79899 Other long term (current) drug therapy: Secondary | ICD-10-CM | POA: Diagnosis not present

## 2019-05-04 LAB — BASIC METABOLIC PANEL
Anion gap: 13 (ref 5–15)
BUN: 45 mg/dL — ABNORMAL HIGH (ref 6–20)
CO2: 22 mmol/L (ref 22–32)
Calcium: 9.6 mg/dL (ref 8.9–10.3)
Chloride: 103 mmol/L (ref 98–111)
Creatinine, Ser: 5.64 mg/dL — ABNORMAL HIGH (ref 0.61–1.24)
GFR calc Af Amer: 12 mL/min — ABNORMAL LOW (ref >60)
GFR calc non Af Amer: 10 mL/min — ABNORMAL LOW (ref >60)
Glucose, Bld: 109 mg/dL — ABNORMAL HIGH (ref 70–99)
Potassium: 3.9 mmol/L (ref 3.5–5.1)
Sodium: 138 mmol/L (ref 135–145)

## 2019-05-04 LAB — CBC
HCT: 38.5 % — ABNORMAL LOW (ref 39.0–52.0)
Hemoglobin: 11.7 g/dL — ABNORMAL LOW (ref 13.0–17.0)
MCH: 27.1 pg (ref 26.0–34.0)
MCHC: 30.4 g/dL (ref 30.0–36.0)
MCV: 89.3 fL (ref 80.0–100.0)
Platelets: 227 10*3/uL (ref 150–400)
RBC: 4.31 MIL/uL (ref 4.22–5.81)
RDW: 14.6 % (ref 11.5–15.5)
WBC: 6.3 10*3/uL (ref 4.0–10.5)
nRBC: 0 % (ref 0.0–0.2)

## 2019-05-04 LAB — TROPONIN I (HIGH SENSITIVITY)
Troponin I (High Sensitivity): 18 ng/L — ABNORMAL HIGH (ref <18)
Troponin I (High Sensitivity): 19 ng/L — ABNORMAL HIGH (ref <18)

## 2019-05-04 MED ORDER — SODIUM CHLORIDE 0.9% FLUSH: 3.0000 mL | Freq: Once | INTRAVENOUS | Status: DC
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
## 2019-05-04 NOTE — ED Notes (Signed)
Patient verbalizes understanding of discharge instructions. Opportunity for questioning and answers were provided. Armband removed by staff, pt discharged from ED to home 

## 2019-05-04 NOTE — Discharge Instructions (Addendum)
Work-up here today for the chest pain which is sort of nonspecific for an acute cardiac event.  Troponins x2 were negative chest x-ray normal.  No significant lab abnormalities other than your chronic renal failure.  Return for any new or worse symptoms.  Make an appointment to follow-up with your cardiologist.

## 2019-05-04 NOTE — ED Triage Notes (Signed)
Pt presents w/Right side chest pain radiating into Right arm x25 mins.

## 2019-05-08 ENCOUNTER — Other Ambulatory Visit: Payer: Self-pay

## 2019-05-08 MED ORDER — ISOSORBIDE MONONITRATE ER 30 MG PO TB24: ORAL_TABLET | ORAL | 5 refills | Status: DC

## 2019-05-12 ENCOUNTER — Ambulatory Visit: Payer: Medicare HMO | Admitting: General Practice

## 2019-05-15 ENCOUNTER — Other Ambulatory Visit: Payer: Self-pay

## 2019-05-15 MED ORDER — ISOSORBIDE MONONITRATE ER 30 MG PO TB24: ORAL_TABLET | ORAL | 1 refills | Status: DC

## 2019-05-18 ENCOUNTER — Other Ambulatory Visit: Payer: Self-pay | Admitting: Internal Medicine

## 2019-05-19 ENCOUNTER — Telehealth: Payer: Self-pay | Admitting: Internal Medicine

## 2019-05-19 NOTE — Telephone Encounter (Signed)
New message:   Pt is calling and states he would like some albuterol. He has not taken this medication in over two years but a Dr. Who formally worked here used to prescribe it for him. I advised the patient that it is not on his medication lists and he would need an appt and so I scheduled him an appt but the patient would still like to know what he can do until he gets this prescription. Please advise

## 2019-05-23 ENCOUNTER — Ambulatory Visit: Payer: Medicare HMO | Admitting: Internal Medicine

## 2019-05-23 NOTE — Telephone Encounter (Signed)
Please advise the message.  Thanks

## 2019-05-24 MED ORDER — ALBUTEROL SULFATE HFA 108 (90 BASE) MCG/ACT IN AERS: 2.0000 | INHALATION_SPRAY | Freq: Four times a day (QID) | RESPIRATORY_TRACT | 5 refills | Status: DC | PRN

## 2019-05-24 NOTE — Telephone Encounter (Signed)
Done erx 

## 2019-05-26 ENCOUNTER — Other Ambulatory Visit: Payer: Self-pay

## 2019-05-26 ENCOUNTER — Ambulatory Visit (INDEPENDENT_AMBULATORY_CARE_PROVIDER_SITE_OTHER): Payer: Medicare HMO | Admitting: Internal Medicine

## 2019-05-26 ENCOUNTER — Encounter: Payer: Self-pay | Admitting: Internal Medicine

## 2019-05-26 VITALS — BP 146/80 | HR 69 | Temp 97.9°F | Ht 74.0 in | Wt 304.0 lb

## 2019-05-26 DIAGNOSIS — I10 Essential (primary) hypertension: Secondary | ICD-10-CM

## 2019-05-26 DIAGNOSIS — N184 Chronic kidney disease, stage 4 (severe): Secondary | ICD-10-CM | POA: Diagnosis not present

## 2019-05-26 DIAGNOSIS — Z0000 Encounter for general adult medical examination without abnormal findings: Secondary | ICD-10-CM | POA: Diagnosis not present

## 2019-05-26 DIAGNOSIS — E785 Hyperlipidemia, unspecified: Secondary | ICD-10-CM | POA: Diagnosis not present

## 2019-05-26 DIAGNOSIS — R079 Chest pain, unspecified: Secondary | ICD-10-CM

## 2019-05-26 DIAGNOSIS — Z0001 Encounter for general adult medical examination with abnormal findings: Secondary | ICD-10-CM

## 2019-05-26 MED ORDER — SODIUM BICARBONATE 650 MG PO TABS: 650.0000 mg | ORAL_TABLET | Freq: Three times a day (TID) | ORAL | 5 refills | Status: DC

## 2019-05-26 NOTE — Progress Notes (Signed)
Subjective:    Patient ID: Craig Flynn, male    DOB: 1961/02/28, 59 y.o.   MRN: [id number]  HPI   Here for wellness and f/u;  Overall doing ok;  Pt denies worsening SOB, DOE, wheezing, orthopnea, PND, worsening LE edema, palpitations, dizziness or syncope.  Pt denies neurological change such as new headache, facial or extremity weakness.  Pt denies polydipsia, polyuria, or low sugar symptoms. Pt states overall good compliance with treatment and medications, good tolerability, and has been trying to follow appropriate diet.  Pt denies worsening depressive symptoms, suicidal ideation or panic. No fever, night sweats, wt loss, loss of appetite, or other constitutional symptoms.  Pt states good ability with ADL's, has low fall risk, home safety reviewed and adequate, no other significant changes in hearing or vision, and only occasionally active with exercise. Also with CP, dull and pressure without radiation, diaphoresis, n/v. Last GXT 2017 abnormal with fixed defect, cath not pursued due to renal fxn. Pt plans to call for eye doctor appt soon as is due. Has appt with Cards next mo.  Past Medical History:  Diagnosis Date  . Anemia 06/2015  . Arthritis    knee  . Cancer [facility])    thyroid cancer  . Cardiomyopathy ([place])    a. 10/2013: EF reduced to 35-40% b. 09/2014: EF improved to 55-60%, Grade 1 DD noted.  . Chest pain 06/2015  . CHF (congestive heart failure) ([place])   . CKD (chronic kidney disease), stage IV [facility])    sees  Dr Lorrene Reid  . Dyspnea    with exerion   . GERD (gastroesophageal reflux disease)   . Gout   . Hypertension   . Obesity   . Sleep apnea    not wearing c-pap now, needs new slep study per pt. (01/22/2017)  . Tuberculosis 1981   while the pt was in the service.  . Tubular adenoma of colon 10/2015  . Wears glasses    Past Surgical History:  Procedure Laterality Date  . AV FISTULA PLACEMENT Left 03/25/2016   Procedure: Creation of Left Arm ARTERIOVENOUS (AV) FISTULA;   Surgeon: Elam Dutch, MD;  Location: [facility] OR;  Service: Vascular;  Laterality: Left;  . COLONOSCOPY W/ BIOPSIES AND POLYPECTOMY     "no problem"  . HERNIA REPAIR    . LAPAROSCOPIC CHOLECYSTECTOMY    . LIGATION OF COMPETING BRANCHES OF ARTERIOVENOUS FISTULA Left 01/25/2018   Procedure: LIGATION OF COMPETING BRANCHES And Revision of ARTERIOVENOUS FISTULA LEFT ARM.;  Surgeon: Elam Dutch, MD;  Location: [place];  Service: Vascular;  Laterality: Left;  . THYROIDECTOMY  01/22/2017  . THYROIDECTOMY Left 01/22/2017   Procedure: THYROIDECTOMY;  Surgeon: Melida Quitter, MD;  Location: [place];  Service: ENT;  Laterality: Left;  . THYROIDECTOMY Right 03/26/2017   Procedure: COMPLETION OF THYROIDECTOMY;  Surgeon: Melida Quitter, MD;  Location: [place];  Service: ENT;  Laterality: Right;  . UMBILICAL HERNIA REPAIR    . WISDOM TOOTH EXTRACTION      reports that he has been smoking cigarettes. He has a 20.00 pack-year smoking history. He has never used smokeless tobacco. He reports previous alcohol use. He reports that he does not use drugs. family history includes Healthy in his maternal grandfather, paternal grandfather, and paternal grandmother; Hypertension in his maternal grandmother; Kidney disease in his maternal grandmother; Pneumonia in his father; Stroke in his mother. No Known Allergies Current Outpatient Medications on File Prior to Visit  Medication Sig Dispense Refill  .  acetaminophen (TYLENOL) 500 MG tablet Take 2 tablets (1,000 mg total) by mouth every 8 (eight) hours as needed for mild pain or headache. 60 tablet 0  . albuterol (VENTOLIN HFA) 108 (90 Base) MCG/ACT inhaler Inhale 2 puffs into the lungs every 6 (six) hours as needed for wheezing or shortness of breath. 18 g 5  . allopurinol (ZYLOPRIM) 100 MG tablet Take 150 mg by mouth daily.     Marland Kitchen aspirin EC 81 MG tablet Take 81 mg by mouth daily.    . calcitRIOL (ROCALTROL) 0.5 MCG capsule Take 0.5 mcg by mouth every Monday, Wednesday, and  Friday.    . calcium elemental as carbonate (TUMS ULTRA 1000) 400 MG chewable tablet Chew 1,000 mg by mouth as needed for heartburn.     . carvedilol (COREG) 25 MG tablet TAKE 1 TABLET(25 MG) BY MOUTH TWICE DAILY 180 tablet 2  . fenofibrate 54 MG tablet TAKE 1 TABLET BY MOUTH EVERY DAY (Patient taking differently: Take 54 mg by mouth daily. ) 90 tablet 1  . furosemide (LASIX) 80 MG tablet Take 1 tablet (80 mg total) by mouth 2 (two) times daily. (Patient taking differently: Take 160 mg by mouth See admin instructions. Take 160 mg by mouth in the morning and 160 mg in the afternoon) 60 tablet 1  . iron polysaccharides (NU-IRON) 150 MG capsule Take 1 capsule (150 mg total) by mouth daily. 90 capsule 0  . isosorbide mononitrate (IMDUR) 30 MG 24 hr tablet TAKE 2 TABLETS(60 MG) BY MOUTH DAILY 60 tablet 1  . levothyroxine (SYNTHROID) 137 MCG tablet Take 137 mcg by mouth daily before breakfast.    . nitroGLYCERIN (NITROSTAT) 0.4 MG SL tablet DISSOLVE 1 TABLET UNDER THE TONGUE EVERY 5 MINUTES AS NEEDED FOR CHEST PAIN (Patient taking differently: Place 0.4 mg under the tongue every 5 (five) minutes as needed for chest pain. ) 25 tablet 3  . pantoprazole (PROTONIX) 40 MG tablet Take 1 tablet (40 mg total) by mouth daily. (Patient taking differently: Take 40 mg by mouth 2 (two) times daily before a meal. ) 90 tablet 3  . potassium chloride SA (K-DUR,KLOR-CON) 20 MEQ tablet Take 20 mEq by mouth daily.     . Vitamin D, Ergocalciferol, (DRISDOL) 1.25 MG (50000 UT) CAPS capsule Take 1 capsule (50,000 Units total) by mouth every 7 (seven) days. 12 capsule 0   No current facility-administered medications on file prior to visit.   Review of Systems All otherwise neg per pt     Objective:   Physical Exam BP (!) 146/80   Pulse 69   Temp 97.9 F (36.6 C)   Ht 6\' 2"  (1.88 m)   Wt (!) 304 lb (137.9 kg)   SpO2 100%   BMI 39.03 kg/m   VS noted,  Constitutional: Pt appears in NAD HENT: Head: NCAT.  Right Ear:  External ear normal.  Left Ear: External ear normal.  Eyes: . Pupils are equal, round, and reactive to light. Conjunctivae and EOM are normal Nose: without d/c or deformity Neck: Neck supple. Gross normal ROM Cardiovascular: Normal rate and regular rhythm.   Pulmonary/Chest: Effort normal and breath sounds without rales or wheezing.  Abd:  Soft, NT, ND, + BS, no organomegaly Neurological: Pt is alert. At baseline orientation, motor grossly intact Skin: Skin is warm. No rashes, other new lesions, no LE edema Psychiatric: Pt behavior is normal without agitation  All otherwise neg per pt  Lab Results  Component Value Date   WBC  6.3 05/04/2019   HGB 11.7 (L) 05/04/2019   HCT 38.5 (L) 05/04/2019   PLT 227 05/04/2019   GLUCOSE 109 (H) 05/04/2019   CHOL 164 10/14/2018   TRIG 182.0 (H) 10/14/2018   HDL 31.10 (L) 10/14/2018   LDLCALC 96 10/14/2018   ALT 17 02/27/2019   AST 17 02/27/2019   NA 138 05/04/2019   K 3.9 05/04/2019   CL 103 05/04/2019   CREATININE 5.64 (H) 05/04/2019   BUN 45 (H) 05/04/2019   CO2 22 05/04/2019   TSH 0.03 (L) 10/14/2018   PSA 2.01 10/14/2018   HGBA1C 6.3 10/14/2018   Declines further ecg or labs today     Assessment & Plan:

## 2019-05-26 NOTE — Patient Instructions (Addendum)
You are signed up for the [facility] Vaccine Wait List  Please continue all other medications as before, and refills have been done if requested - the sodium bicarb  Please have the pharmacy call with any other refills you may need.  Please continue your efforts at being more active, low cholesterol diet, and weight control.  Please keep your appointments with your specialists as you may have planned - cardiology  You will be contacted regarding the referral for: stress test  Please make an Appointment to return in 6 months, or sooner if needed

## 2019-05-27 ENCOUNTER — Encounter: Payer: Self-pay | Admitting: Internal Medicine

## 2019-05-27 NOTE — Assessment & Plan Note (Signed)
stable overall by history and exam, recent data reviewed with pt, and pt to continue medical treatment as before,  to f/u any worsening symptoms or concerns  

## 2019-05-27 NOTE — Assessment & Plan Note (Addendum)
Atypcal, for stress test  I spent 20 minutes in preparing to see the patient by review of recent labs, imaging and procedures, obtaining and reviewing separately obtained history, communicating with the patient and family or caregiver, ordering medications, tests or procedures, and documenting clinical information in the EHR including the differential Dx, treatment, and any further evaluation and other management of chest pain, HTN, HLD, CKD

## 2019-05-27 NOTE — Assessment & Plan Note (Signed)

## 2019-06-13 ENCOUNTER — Telehealth (HOSPITAL_COMMUNITY): Payer: Self-pay

## 2019-06-13 NOTE — Telephone Encounter (Signed)
Detailed instructions were given to the patient when I spoke with him. He stated that he would be here for his test, and understood the instructions. Asked to call back with any questions. S.Liona Wengert EMTP

## 2019-06-16 ENCOUNTER — Other Ambulatory Visit (HOSPITAL_COMMUNITY): Payer: Medicare HMO

## 2019-06-20 ENCOUNTER — Encounter (HOSPITAL_COMMUNITY): Payer: Medicare HMO

## 2019-06-22 ENCOUNTER — Observation Stay (HOSPITAL_COMMUNITY)
Admission: EM | Admit: 2019-06-22 | Discharge: 2019-06-23 | Disposition: A | Discharge status: Home / Self Care | Payer: Medicare HMO | Attending: Internal Medicine | Admitting: Internal Medicine

## 2019-06-22 ENCOUNTER — Encounter (HOSPITAL_COMMUNITY): Payer: Self-pay | Admitting: Emergency Medicine

## 2019-06-22 ENCOUNTER — Emergency Department (HOSPITAL_COMMUNITY): Payer: Medicare HMO

## 2019-06-22 DIAGNOSIS — I10 Essential (primary) hypertension: Secondary | ICD-10-CM | POA: Diagnosis present

## 2019-06-22 DIAGNOSIS — Z79890 Hormone replacement therapy (postmenopausal): Secondary | ICD-10-CM | POA: Diagnosis not present

## 2019-06-22 DIAGNOSIS — E0590 Thyrotoxicosis, unspecified without thyrotoxic crisis or storm: Secondary | ICD-10-CM | POA: Diagnosis not present

## 2019-06-22 DIAGNOSIS — Z9119 Patient's noncompliance with other medical treatment and regimen: Secondary | ICD-10-CM | POA: Diagnosis not present

## 2019-06-22 DIAGNOSIS — R739 Hyperglycemia, unspecified: Secondary | ICD-10-CM

## 2019-06-22 DIAGNOSIS — N186 End stage renal disease: Secondary | ICD-10-CM | POA: Diagnosis not present

## 2019-06-22 DIAGNOSIS — R072 Precordial pain: Secondary | ICD-10-CM

## 2019-06-22 DIAGNOSIS — E1159 Type 2 diabetes mellitus with other circulatory complications: Secondary | ICD-10-CM

## 2019-06-22 DIAGNOSIS — R0602 Shortness of breath: Secondary | ICD-10-CM | POA: Diagnosis present

## 2019-06-22 DIAGNOSIS — I5042 Chronic combined systolic (congestive) and diastolic (congestive) heart failure: Secondary | ICD-10-CM | POA: Diagnosis not present

## 2019-06-22 DIAGNOSIS — R55 Syncope and collapse: Secondary | ICD-10-CM | POA: Diagnosis not present

## 2019-06-22 DIAGNOSIS — Z6837 Body mass index (BMI) 37.0-37.9, adult: Secondary | ICD-10-CM | POA: Insufficient documentation

## 2019-06-22 DIAGNOSIS — E890 Postprocedural hypothyroidism: Secondary | ICD-10-CM | POA: Insufficient documentation

## 2019-06-22 DIAGNOSIS — Z7982 Long term (current) use of aspirin: Secondary | ICD-10-CM | POA: Diagnosis not present

## 2019-06-22 DIAGNOSIS — R42 Dizziness and giddiness: Secondary | ICD-10-CM | POA: Diagnosis not present

## 2019-06-22 DIAGNOSIS — D631 Anemia in chronic kidney disease: Secondary | ICD-10-CM | POA: Diagnosis not present

## 2019-06-22 DIAGNOSIS — F17210 Nicotine dependence, cigarettes, uncomplicated: Secondary | ICD-10-CM | POA: Diagnosis not present

## 2019-06-22 DIAGNOSIS — M109 Gout, unspecified: Secondary | ICD-10-CM | POA: Diagnosis not present

## 2019-06-22 DIAGNOSIS — E119 Type 2 diabetes mellitus without complications: Secondary | ICD-10-CM

## 2019-06-22 DIAGNOSIS — N185 Chronic kidney disease, stage 5: Secondary | ICD-10-CM | POA: Diagnosis not present

## 2019-06-22 DIAGNOSIS — Z85850 Personal history of malignant neoplasm of thyroid: Secondary | ICD-10-CM | POA: Insufficient documentation

## 2019-06-22 DIAGNOSIS — Z20822 Contact with and (suspected) exposure to covid-19: Secondary | ICD-10-CM | POA: Insufficient documentation

## 2019-06-22 DIAGNOSIS — I129 Hypertensive chronic kidney disease with stage 1 through stage 4 chronic kidney disease, or unspecified chronic kidney disease: Secondary | ICD-10-CM | POA: Diagnosis not present

## 2019-06-22 DIAGNOSIS — I429 Cardiomyopathy, unspecified: Secondary | ICD-10-CM | POA: Insufficient documentation

## 2019-06-22 DIAGNOSIS — I5032 Chronic diastolic (congestive) heart failure: Secondary | ICD-10-CM | POA: Diagnosis present

## 2019-06-22 DIAGNOSIS — R079 Chest pain, unspecified: Principal | ICD-10-CM | POA: Diagnosis present

## 2019-06-22 DIAGNOSIS — E785 Hyperlipidemia, unspecified: Secondary | ICD-10-CM | POA: Diagnosis not present

## 2019-06-22 DIAGNOSIS — E1122 Type 2 diabetes mellitus with diabetic chronic kidney disease: Secondary | ICD-10-CM | POA: Insufficient documentation

## 2019-06-22 DIAGNOSIS — R7989 Other specified abnormal findings of blood chemistry: Secondary | ICD-10-CM | POA: Diagnosis not present

## 2019-06-22 DIAGNOSIS — K219 Gastro-esophageal reflux disease without esophagitis: Secondary | ICD-10-CM | POA: Insufficient documentation

## 2019-06-22 DIAGNOSIS — G4733 Obstructive sleep apnea (adult) (pediatric): Secondary | ICD-10-CM | POA: Diagnosis not present

## 2019-06-22 DIAGNOSIS — E669 Obesity, unspecified: Secondary | ICD-10-CM | POA: Insufficient documentation

## 2019-06-22 DIAGNOSIS — I152 Hypertension secondary to endocrine disorders: Secondary | ICD-10-CM | POA: Insufficient documentation

## 2019-06-22 DIAGNOSIS — E1169 Type 2 diabetes mellitus with other specified complication: Secondary | ICD-10-CM | POA: Diagnosis not present

## 2019-06-22 DIAGNOSIS — I132 Hypertensive heart and chronic kidney disease with heart failure and with stage 5 chronic kidney disease, or end stage renal disease: Secondary | ICD-10-CM | POA: Insufficient documentation

## 2019-06-22 DIAGNOSIS — N184 Chronic kidney disease, stage 4 (severe): Secondary | ICD-10-CM

## 2019-06-22 LAB — RAPID URINE DRUG SCREEN, HOSP PERFORMED
Amphetamines: NOT DETECTED
Barbiturates: NOT DETECTED
Benzodiazepines: NOT DETECTED
Cocaine: NOT DETECTED
Opiates: NOT DETECTED
Tetrahydrocannabinol: NOT DETECTED

## 2019-06-22 LAB — URINALYSIS, ROUTINE W REFLEX MICROSCOPIC
Bacteria, UA: NONE SEEN
Bilirubin Urine: NEGATIVE
Glucose, UA: NEGATIVE mg/dL
Hgb urine dipstick: NEGATIVE
Ketones, ur: NEGATIVE mg/dL
Leukocytes,Ua: NEGATIVE
Nitrite: NEGATIVE
Protein, ur: 100 mg/dL — AB
Specific Gravity, Urine: 1.009 (ref 1.005–1.030)
pH: 6 (ref 5.0–8.0)

## 2019-06-22 LAB — CBC
HCT: 37.3 % — ABNORMAL LOW (ref 39.0–52.0)
Hemoglobin: 11.6 g/dL — ABNORMAL LOW (ref 13.0–17.0)
MCH: 27.4 pg (ref 26.0–34.0)
MCHC: 31.1 g/dL (ref 30.0–36.0)
MCV: 88 fL (ref 80.0–100.0)
Platelets: 206 10*3/uL (ref 150–400)
RBC: 4.24 MIL/uL (ref 4.22–5.81)
RDW: 14.1 % (ref 11.5–15.5)
WBC: 5.8 10*3/uL (ref 4.0–10.5)
nRBC: 0 % (ref 0.0–0.2)

## 2019-06-22 LAB — BASIC METABOLIC PANEL
Anion gap: 14 (ref 5–15)
BUN: 44 mg/dL — ABNORMAL HIGH (ref 6–20)
CO2: 23 mmol/L (ref 22–32)
Calcium: 9.1 mg/dL (ref 8.9–10.3)
Chloride: 102 mmol/L (ref 98–111)
Creatinine, Ser: 6.09 mg/dL — ABNORMAL HIGH (ref 0.61–1.24)
GFR calc Af Amer: 11 mL/min — ABNORMAL LOW (ref >60)
GFR calc non Af Amer: 9 mL/min — ABNORMAL LOW (ref >60)
Glucose, Bld: 100 mg/dL — ABNORMAL HIGH (ref 70–99)
Potassium: 3.9 mmol/L (ref 3.5–5.1)
Sodium: 139 mmol/L (ref 135–145)

## 2019-06-22 LAB — SARS CORONAVIRUS 2 (TAT 6-24 HRS): SARS Coronavirus 2: NEGATIVE

## 2019-06-22 LAB — TROPONIN I (HIGH SENSITIVITY)
Troponin I (High Sensitivity): 17 ng/L (ref <18)
Troponin I (High Sensitivity): 18 ng/L — ABNORMAL HIGH (ref <18)

## 2019-06-22 LAB — T4, FREE: Free T4: 1.13 ng/dL — ABNORMAL HIGH (ref 0.61–1.12)

## 2019-06-22 LAB — TSH: TSH: 0.016 u[IU]/mL — ABNORMAL LOW (ref 0.350–4.500)

## 2019-06-22 LAB — BRAIN NATRIURETIC PEPTIDE: B Natriuretic Peptide: 49.9 pg/mL (ref 0.0–100.0)

## 2019-06-22 MED ORDER — FENOFIBRATE 54 MG PO TABS
54.0000 mg | ORAL_TABLET | Freq: Every day | ORAL | Status: DC
  Administered 2019-06-23: 54 mg via ORAL
  Filled 2019-06-22: qty 1

## 2019-06-22 MED ORDER — FUROSEMIDE 80 MG PO TABS
160.0000 mg | ORAL_TABLET | Freq: Two times a day (BID) | ORAL | Status: DC
  Administered 2019-06-23 (×2): 160 mg via ORAL
  Filled 2019-06-22 (×2): qty 2

## 2019-06-22 MED ORDER — INSULIN ASPART 100 UNIT/ML ~~LOC~~ SOLN: 0.0000 [IU] | Freq: Three times a day (TID) | SUBCUTANEOUS | Status: DC

## 2019-06-22 MED ORDER — PANTOPRAZOLE SODIUM 40 MG PO TBEC
40.0000 mg | DELAYED_RELEASE_TABLET | Freq: Two times a day (BID) | ORAL | Status: DC
  Administered 2019-06-23: 40 mg via ORAL
  Filled 2019-06-22: qty 1

## 2019-06-22 MED ORDER — SODIUM CHLORIDE 0.9% FLUSH: 3.0000 mL | INTRAVENOUS | Status: DC | PRN

## 2019-06-22 MED ORDER — ALBUTEROL SULFATE (2.5 MG/3ML) 0.083% IN NEBU: 3.0000 mL | INHALATION_SOLUTION | Freq: Four times a day (QID) | RESPIRATORY_TRACT | Status: DC | PRN

## 2019-06-22 MED ORDER — LEVOTHYROXINE SODIUM 25 MCG PO TABS: 125.0000 ug | ORAL_TABLET | Freq: Every day | ORAL | Status: DC

## 2019-06-22 MED ORDER — ONDANSETRON HCL 4 MG PO TABS: 4.0000 mg | ORAL_TABLET | Freq: Four times a day (QID) | ORAL | Status: DC | PRN

## 2019-06-22 MED ORDER — ACETAMINOPHEN 650 MG RE SUPP: 650.0000 mg | Freq: Four times a day (QID) | RECTAL | Status: DC | PRN

## 2019-06-22 MED ORDER — HYDROCODONE-ACETAMINOPHEN 5-325 MG PO TABS
1.0000 | ORAL_TABLET | ORAL | Status: DC | PRN
  Administered 2019-06-23: 1 via ORAL
  Filled 2019-06-22: qty 1

## 2019-06-22 MED ORDER — SODIUM CHLORIDE 0.9% FLUSH: 3.0000 mL | Freq: Once | INTRAVENOUS | Status: DC

## 2019-06-22 MED ORDER — SODIUM CHLORIDE 0.9% FLUSH
3.0000 mL | Freq: Two times a day (BID) | INTRAVENOUS | Status: DC
  Administered 2019-06-22 – 2019-06-23 (×2): 3 mL via INTRAVENOUS

## 2019-06-22 MED ORDER — CALCITRIOL 0.5 MCG PO CAPS
0.5000 ug | ORAL_CAPSULE | ORAL | Status: DC
  Administered 2019-06-23: 0.5 ug via ORAL
  Filled 2019-06-22: qty 1

## 2019-06-22 MED ORDER — ONDANSETRON HCL 4 MG/2ML IJ SOLN: 4.0000 mg | Freq: Four times a day (QID) | INTRAMUSCULAR | Status: DC | PRN

## 2019-06-22 MED ORDER — ISOSORBIDE MONONITRATE ER 30 MG PO TB24
30.0000 mg | ORAL_TABLET | Freq: Every day | ORAL | Status: DC
  Administered 2019-06-23: 30 mg via ORAL
  Filled 2019-06-22: qty 1

## 2019-06-22 MED ORDER — ALLOPURINOL 300 MG PO TABS
150.0000 mg | ORAL_TABLET | Freq: Every day | ORAL | Status: DC
  Administered 2019-06-22 – 2019-06-23 (×2): 150 mg via ORAL
  Filled 2019-06-22 (×2): qty 1

## 2019-06-22 MED ORDER — HEPARIN SODIUM (PORCINE) 5000 UNIT/ML IJ SOLN
5000.0000 [IU] | Freq: Three times a day (TID) | INTRAMUSCULAR | Status: DC
  Administered 2019-06-22 – 2019-06-23 (×3): 5000 [IU] via SUBCUTANEOUS
  Filled 2019-06-22 (×3): qty 1

## 2019-06-22 MED ORDER — HYDRALAZINE HCL 20 MG/ML IJ SOLN
10.0000 mg | Freq: Once | INTRAMUSCULAR | Status: AC
  Administered 2019-06-22: 10 mg via INTRAVENOUS
  Filled 2019-06-22: qty 1

## 2019-06-22 MED ORDER — CARVEDILOL 25 MG PO TABS
25.0000 mg | ORAL_TABLET | Freq: Two times a day (BID) | ORAL | Status: DC
  Administered 2019-06-23: 25 mg via ORAL
  Filled 2019-06-22: qty 1

## 2019-06-22 MED ORDER — SODIUM CHLORIDE 0.9 % IV SOLN: 250.0000 mL | INTRAVENOUS | Status: DC | PRN

## 2019-06-22 MED ORDER — ACETAMINOPHEN 325 MG PO TABS: 650.0000 mg | ORAL_TABLET | Freq: Four times a day (QID) | ORAL | Status: DC | PRN
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
## 2019-06-22 NOTE — Consult Note (Addendum)
Cardiology Consultation:  Patient ID: Craig Flynn MRN: [id number]; DOB: 1961/01/16  Admit date: 06/22/2019 Date of Consult: 06/22/2019  Primary Care Provider: Biagio Borg, MD Primary Cardiologist: Minus Breeding, MD   Patient Profile:  Craig Flynn is a 59 y.o. male with a hx of CKD IV/V, HTN, morbid obesity who is being seen today for the evaluation of chest pain/SOB/light headedness at the request of Craig Belling, MD.  History of Present Illness:  [name] presents with an event of what he describes as light headedness for 30 seconds. He reports he drove the [employer]cery store around 12:30 PM and after parking his car he reports he had an episode of light headedness. He reports to me that he did not frankly pass out. He reports no CP or SOB prior to the episode. He reports after the episode he had SOB and chest tightness. The pain is described as tightness. He reports that it is worse with inspiration. He reports no palpitations SOB or chest pain prior to the event in questions. He did not lose his urine or bowels after the event. He reports he did not frankly pass out (his words).   He reports he has had intermittent chest tightness for years. The pain is improved with relaxation and tylenol. He had a NM stress in 2017 that was likely normal on my review (inferior fixed defect likely diaphragm attenuation). Echo in 2016 55-60% without WMA. He has been stable and aggressive things have not been done due to nonspecific symptoms and kidney disease. He also reports worsening SOB with exertion. This has been going on for a years too. The only new symptom is this light headed episode.    On arrival to the ER he was noted to have BP 161/89 with heart rate 62. Telemetry shows no arrhythmia. Cr 6.09, BUN 44, WBC 5.8, Hgb 11.6, TSH 0.016. CXR clear. Hs troponin 19->18. EKG NSR 67 bpm with non-specific ST changes and likely LVH. No change from prior.    Heart Pathway Score:       Past Medical  History: Past Medical History:  Diagnosis Date  . Anemia 06/2015  . Arthritis    knee  . Cancer [facility])    thyroid cancer  . Cardiomyopathy ([place])    a. 10/2013: EF reduced to 35-40% b. 09/2014: EF improved to 55-60%, Grade 1 DD noted.  . Chest pain 06/2015  . CHF (congestive heart failure) ([place])   . CKD (chronic kidney disease), stage IV [facility])    sees  Dr Lorrene Reid  . Dyspnea    with exerion   . GERD (gastroesophageal reflux disease)   . Gout   . Hypertension   . Obesity   . Sleep apnea    not wearing c-pap now, needs new slep study per pt. (01/22/2017)  . Tuberculosis 1981   while the pt was in the service.  . Tubular adenoma of colon 10/2015  . Wears glasses     Past Surgical History: Past Surgical History:  Procedure Laterality Date  . AV FISTULA PLACEMENT Left 03/25/2016   Procedure: Creation of Left Arm ARTERIOVENOUS (AV) FISTULA;  Surgeon: Elam Dutch, MD;  Location: [facility] OR;  Service: Vascular;  Laterality: Left;  . COLONOSCOPY W/ BIOPSIES AND POLYPECTOMY     "no problem"  . HERNIA REPAIR    . LAPAROSCOPIC CHOLECYSTECTOMY    . LIGATION OF COMPETING BRANCHES OF ARTERIOVENOUS FISTULA Left 01/25/2018   Procedure: LIGATION OF COMPETING BRANCHES And Revision of  ARTERIOVENOUS FISTULA LEFT ARM.;  Surgeon: Elam Dutch, MD;  Location: [facility] OR;  Service: Vascular;  Laterality: Left;  . THYROIDECTOMY  01/22/2017  . THYROIDECTOMY Left 01/22/2017   Procedure: THYROIDECTOMY;  Surgeon: Melida Quitter, MD;  Location: [place];  Service: ENT;  Laterality: Left;  . THYROIDECTOMY Right 03/26/2017   Procedure: COMPLETION OF THYROIDECTOMY;  Surgeon: Melida Quitter, MD;  Location: [place];  Service: ENT;  Laterality: Right;  . UMBILICAL HERNIA REPAIR    . WISDOM TOOTH EXTRACTION       ew   Inpatient Medications: Scheduled Meds: . sodium chloride flush  3 mL Intravenous Once   Continuous Infusions:  PRN Meds:   Allergies:    No Known Allergies  Social History:   Social History    Socioeconomic History  . Marital status: Divorced    Spouse name: Not on file  . Number of children: 2  . Years of education: 3  . Highest education level: Not on file  Occupational History  . Occupation: Disability    Comment: Knees   Tobacco Use  . Smoking status: Current Every Day Smoker    Packs/day: 0.50    Years: 40.00    Pack years: 20.00    Types: Cigarettes  . Smokeless tobacco: Never Used  Substance and Sexual Activity  . Alcohol use: Not Currently    Alcohol/week: 0.0 standard drinks  . Drug use: No  . Sexual activity: Yes  Other Topics Concern  . Not on file  Social History Narrative   Fun: Golf, basketball    Denies religious beliefs effecting health care.    Social Determinants of Health   Financial Resource Strain:   . Difficulty of Paying Living Expenses:   Food Insecurity:   . Worried About Charity fundraiser in the Last Year:   . Arboriculturist in the Last Year:   Transportation Needs:   . Film/video editor (Medical):   Marland Kitchen Lack of Transportation (Non-Medical):   Physical Activity:   . Days of Exercise per Week:   . Minutes of Exercise per Session:   Stress:   . Feeling of Stress :   Social Connections:   . Frequency of Communication with Friends and Family:   . Frequency of Social Gatherings with Friends and Family:   . Attends Religious Services:   . Active Member of Clubs or Organizations:   . Attends Archivist Meetings:   Marland Kitchen Marital Status:   Intimate Partner Violence:   . Fear of Current or Ex-Partner:   . Emotionally Abused:   Marland Kitchen Physically Abused:   . Sexually Abused:      Family History:    Family History  Problem Relation Age of Onset  . Stroke Mother   . Pneumonia Father        died of Pneumonia x3  . Kidney disease Maternal Grandmother   . Hypertension Maternal Grandmother   . Healthy Maternal Grandfather   . Healthy Paternal Grandmother   . Healthy Paternal Grandfather   . CAD Neg Hx   . Colon cancer  Neg Hx   . Esophageal cancer Neg Hx   . Pancreatic cancer Neg Hx   . Prostate cancer Neg Hx   . Stomach cancer Neg Hx   . Rectal cancer Neg Hx      ROS:  All other ROS reviewed and negative. Pertinent positives noted in the HPI.     Physical Exam/Data:   Vitals:  06/22/19 1521 06/22/19 1530 06/22/19 1545 06/22/19 1701  BP: (!) 147/61 (!) 156/65 (!) 151/70   Pulse:      Resp:  18 15 17   Temp:      TempSrc:      SpO2:       No intake or output data in the 24 hours ending 06/22/19 1716  Last 3 Weights 05/26/2019 04/25/2019 02/27/2019  Weight (lbs) 304 lb 302 lb 3.2 oz 306 lb 7 oz  Weight (kg) 137.893 kg 137.077 kg 139 kg    There is no height or weight on file to calculate BMI.   General: obese male, NAD Head: Atraumatic, normal size  Eyes: PEERLA, EOMI  Neck: Supple, no JVD Endocrine: No thryomegaly Cardiac: Normal S1, S2; RRR; no murmurs, rubs, or gallops Lungs: Clear to auscultation bilaterally, no wheezing, rhonchi or rales  Abd: Soft, nontender, no hepatomegaly  Ext: 1+ edema R > L Musculoskeletal: No deformities, BUE and BLE strength normal and equal Skin: Warm and dry, no rashes   Neuro: Alert and oriented to person, place, time, and situation, CNII-XII grossly intact, no focal deficits  Psych: Normal mood and affect   EKG:  The EKG was personally reviewed and demonstrates:  NSR with LVH and nonspecific STT changes  Telemetry:  Telemetry was personally reviewed and demonstrates:  SB 50-60 bpm  Relevant CV Studies: TTE 2016 55-60% NM Stress 2017 - inferior fixed defect (likely diaphragm attenuation)  Laboratory Data: High Sensitivity Troponin:   Recent Labs  Lab 06/22/19 1459  TROPONINIHS 17     Cardiac EnzymesNo results for input(s): TROPONINI in the last 168 hours. No results for input(s): TROPIPOC in the last 168 hours.  Chemistry Recent Labs  Lab 06/22/19 1459  NA 139  K 3.9  CL 102  CO2 23  GLUCOSE 100*  BUN 44*  CREATININE 6.09*  CALCIUM 9.1    GFRNONAA 9*  GFRAA 11*  ANIONGAP 14    No results for input(s): PROT, ALBUMIN, AST, ALT, ALKPHOS, BILITOT in the last 168 hours. Hematology Recent Labs  Lab 06/22/19 1459  WBC 5.8  RBC 4.24  HGB 11.6*  HCT 37.3*  MCV 88.0  MCH 27.4  MCHC 31.1  RDW 14.1  PLT 206   BNPNo results for input(s): BNP, PROBNP in the last 168 hours.  DDimer No results for input(s): DDIMER in the last 168 hours.  Radiology/Studies:  DG Chest 2 View  Result Date: 06/22/2019 CLINICAL DATA:  Chest pain, short of breath, syncope EXAM: CHEST - 2 VIEW COMPARISON:  05/04/2019 FINDINGS: Frontal and lateral views of the chest demonstrate a stable cardiac silhouette. Mild stable interstitial prominence may reflect scarring. No airspace disease, effusion, or pneumothorax. No acute bony abnormalities. IMPRESSION: 1. Stable exam, no acute process. Electronically Signed   By: Randa Ngo M.D.   On: 06/22/2019 15:23    Assessment and Plan:  1. Chest pain/SOB/Dizziness: Atypical CP and no concerns for ACS. Stress test in 2017 was likely normal on my review (inferior defect fixed is likely attenuation artifact from diaphragm). EKG is unchanged from prior. HS troponin flat and negative (19->18). Symptoms more concerning for possible PE. Arrhythmia could be issue as well. I would recommend admission with hospital medicine service and observation on telemetry. I have ordered a BNP and we will repeat echo. I think a DVT study is indicated given asymmetric LE edema. I think hospital team should strongly consider a NM V/Q scan to exclude PE. His chest pain is pleuritic  and more consistent with a pulmonary process. Furthermore, he has had this tightness for a while and tylenol seems to alleviate it. I think if he doesn't have a PE, we can set up him for an outpatient stress test. This is not ACS and overall, I think he needs further work-up of his symptoms.   For questions or updates, please contact [place] Please consult  [email or web address] for contact info under   Signed, [place] T. Audie Box, [place]  06/22/2019 5:16 PM
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
## 2019-06-22 NOTE — H&P (Signed)
Craig Flynn UEA:[id number] DOB: 12/19/1960 DOA: 06/22/2019     PCP: Biagio Borg, MD   Outpatient Specialists:   CARDS:  Kelby Fam, MD  NEphrology:   Dr.  Lorrene Reid    Patient arrived to ER on 06/22/19 at 1439  Patient coming from: home Lives alone,       Chief Complaint:   Chief Complaint  Patient presents with   Loss of Consciousness   Chest Pain    HPI: Craig Flynn is a 59 y.o. male with medical history significant of CKD stage V, HTN, morbid obesity , OSA, GERD    Presented with chest pain/SOB/light headedness   Pt was driving to get Groceries at 12:30PM and once he parked he had some lightheadedness lasting about 30 seconds he never had LOC, but had SOB and chest tightness. He has had some occasional chest tightness in the past worse with inspiration, worse SOB with exertion. Reports chest pain worse with deep breath Reports occasional leg swelling but not lately He spends a lot of time sitting   Patient had nuclear medicine stress test in 2017 which was nonischemic but no cardiac catheterization given CKD No cough no fever  He continues to work drives a taxy Smokes occasional never drank  Infectious risk factors:  Reports  shortness of breath,  chest pain,      Has NOt been vaccinated against COVID    in house  PCR testing  Pending  No results found for: SARSCOV2NAA   Regarding pertinent Chronic problems:      Hyperlipidemia -  on  fenofibrate   HTN on coreg, imdur   chronic CHF systolic/diastolic combined-   In 4782 EF was 45-40 but in 2016 improved to 55/60 with grade 1 diastolic dysfunction On lasix    Hypothyroidism: sp thyroidectomy Lab Results  Component Value Date   TSH 0.016 (L) 06/22/2019   on synthroid    obesity-   BMI Readings from Last 1 Encounters:  05/26/19 39.03 kg/m        OSA  noncompliant with CPAP      CKD stage v- baseline Cr 5.5 Lab Results  Component Value Date   CREATININE 6.09 (H) 06/22/2019    CREATININE 5.64 (H) 05/04/2019   CREATININE 5.81 (H) 02/27/2019   Has AV fistula in place on the  left 2018 but non-functional   While in ER: Troponin mildly elevated 19-18 EKG is nonspecific changes most likely LVH no evidence of acute ischemia hemoglobin 11.6 his creatinine slightly up at 6 but overall stable   ER Provider Called: Cardiology   DrO'Neal, MD They Recommend admit to medicine   thought unlikely to be ACS but was concerned for possible PE recommended admission for observation on telemetry repeat echo and BNP DVT study and VQ scan If no evidence of PE possibly outpatient stress test   seen  in ER  Hospitalist was called for admission for chest pain evaluation  The following Work up has been ordered so far:  Orders Placed This Encounter  Procedures   SARS CORONAVIRUS 2 (TAT 6-24 HRS) Nasopharyngeal Nasopharyngeal Swab   DG Chest 2 View   Basic metabolic panel   CBC   TSH   Brain natriuretic peptide   T3   T4, free   Cardiac monitoring   Saline Lock IV, Maintain IV access   Inpatient consult to Cardiology  ALL PATIENTS BEING ADMITTED/HAVING PROCEDURES NEED COVID-19 SCREENING   Consult to hospitalist  ALL PATIENTS  BEING ADMITTED/HAVING PROCEDURES NEED COVID-19 SCREENING   Pulse oximetry, continuous   ED EKG within 10 minutes   ECHOCARDIOGRAM COMPLETE   VAS [place] LOWER EXTREMITY VENOUS (DVT)    Following Medications were ordered in ER: Medications  sodium chloride flush (NS) 0.9 % injection 3 mL (has no administration in time range)        Consult Orders  (From admission, onward)         Start     Ordered   06/22/19 1854  Consult to hospitalist  ALL PATIENTS BEING ADMITTED/HAVING PROCEDURES NEED COVID-19 SCREENING  Once    Comments: ALL PATIENTS BEING ADMITTED/HAVING PROCEDURES NEED COVID-19 SCREENING  Provider:  (Not yet assigned)  Question Answer Comment  Place call to: [facility]   Reason for Consult Admit      06/22/19 1853           Significant initial  Findings: Abnormal Labs Reviewed  BASIC METABOLIC PANEL - Abnormal; Notable for the following components:      Result Value   Glucose, Bld 100 (*)    BUN 44 (*)    Creatinine, Ser 6.09 (*)    GFR calc non Af Amer 9 (*)    GFR calc Af Amer 11 (*)    All other components within normal limits  CBC - Abnormal; Notable for the following components:   Hemoglobin 11.6 (*)    HCT 37.3 (*)    All other components within normal limits  TSH - Abnormal; Notable for the following components:   TSH 0.016 (*)    All other components within normal limits  T4, FREE - Abnormal; Notable for the following components:   Free T4 1.13 (*)    All other components within normal limits  TROPONIN I (HIGH SENSITIVITY) - Abnormal; Notable for the following components:   Troponin I (High Sensitivity) 18 (*)    All other components within normal limits  Otherwise labs showing:    Recent Labs  Lab 06/22/19 1459  NA 139  K 3.9  CO2 23  GLUCOSE 100*  BUN 44*  CREATININE 6.09*  CALCIUM 9.1    Cr ,  Up from baseline see below Lab Results  Component Value Date   CREATININE 6.09 (H) 06/22/2019   CREATININE 5.64 (H) 05/04/2019   CREATININE 5.81 (H) 02/27/2019    No results for input(s): AST, ALT, ALKPHOS, BILITOT, PROT, ALBUMIN in the last 168 hours. Lab Results  Component Value Date   CALCIUM 9.1 06/22/2019   PHOS 4.9 (H) 02/28/2015     WBC      Component Value Date/Time   WBC 5.8 06/22/2019 1459   ANC    Component Value Date/Time   NEUTROABS 4.1 10/14/2018 0933     Plt: Lab Results  Component Value Date   PLT 206 06/22/2019      COVID-19 Labs  No results for input(s): DDIMER, FERRITIN, LDH, CRP in the last 72 hours.  No results found for: SARSCOV2NAA   HG/HCT  Stable,     Component Value Date/Time   HGB 11.6 (L) 06/22/2019 1459   HCT 37.3 (L) 06/22/2019 1459    No results for input(s): LIPASE, AMYLASE in the last 168 hours. No results for  input(s): AMMONIA in the last 168 hours.  No components found for: LABALBU   Troponin 18-19   ECG: Ordered Personally reviewed by me showing: HR : 67 Rhythm:  NSR,    no evidence of ischemic changes QTC 462  BNP (last 3 results) Recent Labs    06/22/19 1740  BNP 49.9    ProBNP (last 3 results) No results for input(s): PROBNP in the last 8760 hours.  DM  labs:  HbA1C: Recent Labs    10/14/18 0933  HGBA1C 6.3       UA  ordered    CXR -  NON acute    ED Triage Vitals  Enc Vitals Group     BP 06/22/19 1441 (!) 161/89     Pulse Rate 06/22/19 1441 62     Resp 06/22/19 1441 16     Temp 06/22/19 1441 98.7 F (37.1 C)     Temp Source 06/22/19 1441 Oral     SpO2 06/22/19 1441 100 %     Weight --      Height --      Head Circumference --      Peak Flow --      Pain Score 06/22/19 1550 0     Pain Loc --      Pain Edu? --      Excl. in [place]? --   TMAX(24)@       Latest  Blood pressure (!) 151/70, pulse 62, temperature 98.7 F (37.1 C), temperature source Oral, resp. rate 15, SpO2 98 %.      Review of Systems:    Pertinent positives include:  fatigue chest pain,  shortness of breath at rest.   dyspnea on exertion,  Constitutional:  No weight loss, night sweats, Fevers, chills,   weight loss  HEENT:  No headaches, Difficulty swallowing,Tooth/dental problems,Sore throat,  No sneezing, itching, ear ache, nasal congestion, post nasal drip,  Cardio-vascular:  No Orthopnea, PND, anasarca, dizziness, palpitations.no Bilateral lower extremity swelling  GI:  No heartburn, indigestion, abdominal pain, nausea, vomiting, diarrhea, change in bowel habits, loss of appetite, melena, blood in stool, hematemesis Resp:  no No excess mucus, no productive cough, No non-productive cough, No coughing up of blood.No change in color of mucus.No wheezing. Skin:  no rash or lesions. No jaundice GU:  no dysuria, change in color of urine, no urgency or frequency. No straining to  urinate.  No flank pain.  Musculoskeletal:  No joint pain or no joint swelling. No decreased range of motion. No back pain.  Psych:  No change in mood or affect. No depression or anxiety. No memory loss.  Neuro: no localizing neurological complaints, no tingling, no weakness, no double vision, no gait abnormality, no slurred speech, no confusion  All systems reviewed and apart from [place] all are negative  Past Medical History:   Past Medical History:  Diagnosis Date   Anemia 06/2015   Arthritis    knee   Cancer ([place])    thyroid cancer   Cardiomyopathy ([place])    a. 10/2013: EF reduced to 35-40% b. 09/2014: EF improved to 55-60%, Grade 1 DD noted.   Chest pain 06/2015   CHF (congestive heart failure) (HCC)    CKD (chronic kidney disease), stage IV [facility])    sees  Dr Lorrene Reid   Dyspnea    with exerion    GERD (gastroesophageal reflux disease)    Gout    Hypertension    Obesity    Sleep apnea    not wearing c-pap now, needs new slep study per pt. (01/22/2017)   Tuberculosis 1981   while the pt was in the service.   Tubular adenoma of colon 10/2015   Wears glasses  Past Surgical History:  Procedure Laterality Date   AV FISTULA PLACEMENT Left 03/25/2016   Procedure: Creation of Left Arm ARTERIOVENOUS (AV) FISTULA;  Surgeon: Elam Dutch, MD;  Location: [facility] OR;  Service: Vascular;  Laterality: Left;   COLONOSCOPY W/ BIOPSIES AND POLYPECTOMY     "no problem"   HERNIA REPAIR     LAPAROSCOPIC CHOLECYSTECTOMY     LIGATION OF COMPETING BRANCHES OF ARTERIOVENOUS FISTULA Left 01/25/2018   Procedure: LIGATION OF COMPETING BRANCHES And Revision of ARTERIOVENOUS FISTULA LEFT ARM.;  Surgeon: Elam Dutch, MD;  Location: [place];  Service: Vascular;  Laterality: Left;   THYROIDECTOMY  01/22/2017   THYROIDECTOMY Left 01/22/2017   Procedure: THYROIDECTOMY;  Surgeon: Melida Quitter, MD;  Location: [place];  Service: ENT;  Laterality: Left;   THYROIDECTOMY Right  03/26/2017   Procedure: COMPLETION OF THYROIDECTOMY;  Surgeon: Melida Quitter, MD;  Location: [place];  Service: ENT;  Laterality: Right;   UMBILICAL HERNIA REPAIR     WISDOM TOOTH EXTRACTION      Social History:  Ambulatory  Independently      reports that he has been smoking cigarettes. He has a 20.00 pack-year smoking history. He has never used smokeless tobacco. He reports previous alcohol use. He reports that he does not use drugs.   Family History:   Family History  Problem Relation Age of Onset   Stroke Mother    Pneumonia Father        died of Pneumonia x3   Kidney disease Maternal Grandmother    Hypertension Maternal Grandmother    Healthy Maternal Grandfather    Healthy Paternal Grandmother    Healthy Paternal Grandfather    CAD Neg Hx    Colon cancer Neg Hx    Esophageal cancer Neg Hx    Pancreatic cancer Neg Hx    Prostate cancer Neg Hx    Stomach cancer Neg Hx    Rectal cancer Neg Hx     Allergies: No Known Allergies   Prior to Admission medications   Medication Sig Start Date End Date Taking? Authorizing Provider  acetaminophen (TYLENOL) 500 MG tablet Take 2 tablets (1,000 mg total) by mouth every 8 (eight) hours as needed for mild pain or headache. 05/05/18  Yes [place], Delphia Grates, NP  albuterol (VENTOLIN HFA) 108 (90 Base) MCG/ACT inhaler Inhale 2 puffs into the lungs every 6 (six) hours as needed for wheezing or shortness of breath. 05/24/19  Yes Biagio Borg, MD  allopurinol (ZYLOPRIM) 100 MG tablet Take 150 mg by mouth daily.  10/27/18  Yes [provider]  aspirin EC 81 MG tablet Take 81 mg by mouth daily.   Yes [provider]  calcitRIOL (ROCALTROL) 0.5 MCG capsule Take 0.5 mcg by mouth every Monday, Wednesday, and Friday. 12/10/18  Yes [provider]  calcium elemental as carbonate (TUMS ULTRA 1000) 400 MG chewable tablet Chew 1,000 mg by mouth as needed for heartburn.    Yes [provider]  carvedilol  (COREG) 25 MG tablet TAKE 1 TABLET(25 MG) BY MOUTH TWICE DAILY Patient taking differently: Take 25 mg by mouth 2 (two) times daily with a meal.  05/18/19  Yes Biagio Borg, MD  fenofibrate 54 MG tablet TAKE 1 TABLET BY MOUTH EVERY DAY Patient taking differently: Take 54 mg by mouth daily.  03/07/19  Yes Minus Breeding, MD  furosemide (LASIX) 80 MG tablet Take 1 tablet (80 mg total) by mouth 2 (two) times daily. Patient taking differently:  Take 160 mg by mouth See admin instructions. Take 160 mg by mouth in the morning and 160 mg in the afternoon 02/27/15  Yes Reyne Dumas, MD  isosorbide mononitrate (IMDUR) 30 MG 24 hr tablet TAKE 2 TABLETS(60 MG) BY MOUTH DAILY Patient taking differently: Take 30 mg by mouth in the morning and at bedtime.  05/15/19  Yes Minus Breeding, MD  levothyroxine (SYNTHROID) 137 MCG tablet Take 137 mcg by mouth daily before breakfast.   Yes [provider]  pantoprazole (PROTONIX) 40 MG tablet Take 1 tablet (40 mg total) by mouth daily. Patient taking differently: Take 40 mg by mouth 2 (two) times daily before a meal.  02/22/19  Yes Biagio Borg, MD  potassium chloride SA (K-DUR,KLOR-CON) 20 MEQ tablet Take 20 mEq by mouth daily.    Yes [provider]  sodium bicarbonate 650 MG tablet Take 1 tablet (650 mg total) by mouth 3 (three) times daily. 05/26/19  Yes Biagio Borg, MD  iron polysaccharides (NU-IRON) 150 MG capsule Take 1 capsule (150 mg total) by mouth daily. Patient not taking: Reported on 06/22/2019 10/17/18   Biagio Borg, MD  nitroGLYCERIN (NITROSTAT) 0.4 MG SL tablet DISSOLVE 1 TABLET UNDER THE TONGUE EVERY 5 MINUTES AS NEEDED FOR CHEST PAIN Patient taking differently: Place 0.4 mg under the tongue every 5 (five) minutes as needed for chest pain.  03/29/19   Tami Lin, MD   Physical Exam: Blood pressure (!) 151/70, pulse 62, temperature 98.7 F (37.1 C), temperature source Oral, resp. rate 15, SpO2 98 %. 1. General:  in No  Acute  distress   Chronically ill -appearing 2. Psychological: Alert and  Oriented 3. Head/ENT:    Dry Mucous Membranes                          Head Non traumatic, neck supple                           Poor Dentition 4. SKIN:  decreased Skin turgor,  Skin clean Dry and intact no rash 5. Heart: Regular rate and rhythm no Murmur, no Rub or gallop 6. Lungs:  Distant but Clear to auscultation bilaterally, no wheezes or crackles   7. Abdomen: Soft,   non-tender, Non distended  obese  bowel sounds present 8. Lower extremities: no clubbing, cyanosis,mild edema Right >Left 9. Neurologically Grossly intact, moving all 4 extremities equally  10. MSK: Normal range of motion   All other LABS:     Recent Labs  Lab 06/22/19 1459  WBC 5.8  HGB 11.6*  HCT 37.3*  MCV 88.0  PLT 206     Recent Labs  Lab 06/22/19 1459  NA 139  K 3.9  CL 102  CO2 23  GLUCOSE 100*  BUN 44*  CREATININE 6.09*  CALCIUM 9.1     No results for input(s): AST, ALT, ALKPHOS, BILITOT, PROT, ALBUMIN in the last 168 hours.     Cultures:    Component Value Date/Time   SDES BLOOD LEFT HAND 11/17/2013 0840   SPECREQUEST BOTTLES DRAWN AEROBIC ONLY 4CC 11/17/2013 0840   CULT  11/17/2013 0840    NO GROWTH 5 DAYS Performed at [place] 11/23/2013 FINAL 11/17/2013 0840     Radiological Exams on Admission: DG Chest 2 View  Result Date: 06/22/2019 CLINICAL DATA:  Chest pain, short of breath, syncope EXAM: CHEST -  2 VIEW COMPARISON:  05/04/2019 FINDINGS: Frontal and lateral views of the chest demonstrate a stable cardiac silhouette. Mild stable interstitial prominence may reflect scarring. No airspace disease, effusion, or pneumothorax. No acute bony abnormalities. IMPRESSION: 1. Stable exam, no acute process. Electronically Signed   By: Randa Ngo M.D.   On: 06/22/2019 15:23    Chart has been reviewed    Assessment/Plan  59 y.o. male with medical history significant of CKD stage V, HTN,  morbid obesity , OSA, GERD, DM2?     Admitted for pleuritic chest pain shortness of breath    Present on Admission:  Chest pain unclear etiology will evaluate for PE given pleuritic nature chest x-ray unremarkable troponin stable check EKG without evidence of acute ischemia appreciate cardiology input will need nuclear stress test as an outpatient not a candidate for cardiac catheterization given CKD   HTN (hypertension) continue home medications currently stable   SOB (shortness of breath) intermittent but worse with taking a deep breath will have VQ scan ordered given  left lower extremity swelling Dopplers were ordered by cardiology   CKD (chronic kidney disease), stage V ([place]) chronic avoid nephrotoxic medications Followed by nephrology as an outpatient at this point no access available fistula placed but never worked    Abnormal TSH -known history of hypothyroidism low decrease dose to 125 mics a day and will need to have follow-up as an outpatient    Obesity, diabetes, and hypertension syndrome ([place]) we will need nutritional consult as an outpatient   OSA (obstructive sleep apnea) -need to set up sleep apnea testing as an outpatient Has not been compliant with CPAP at home   Chronic combined systolic and diastolic CHF (congestive heart failure) ([place]) -stable continue Lasix at this point appears to be euvolemic   DM 2-  - Order Sensitive  SSI   -  check  HgA1C      Other plan as per orders.  DVT prophylaxis:  Hep Gray     Code Status:  FULL CODE  as per patient   I had personally discussed CODE STATUS with patient    Family Communication:   Family not at  Bedside    Disposition Plan:    To home once workup is complete and patient is stable   Following barriers for discharge:                            PE/DVT rulled out, echo obtained   [place] called: caRDIOLOGY    Admission status:  ED Disposition    ED Disposition  Condition [place]: [place] [100100]  Level of Care: Telemetry Cardiac [103]  I expect the patient will be discharged within 24 hours: No (not a candidate for 5C-Observation unit)  Covid Evaluation: Symptomatic Person Under Investigation (PUI)  Diagnosis: Chest pain [384665]  Admitting Physician: Toy Baker [[id number]]  Attending Physician: Toy Baker [[id number]]       Obs      Level of care     tele  For  24H     Precautions: admitted as  PUI   No active isolations  If Covid PCR is negative  - please DC precautions    PPE: Used by the provider:   P100  eye Goggles,  Gloves  Toy Baker 06/22/2019, 8:38 PM     [facility]     after 2 AM please page floor coverage PA If 7AM-7PM, please contact the day team taking care of the patient using [email or web address]   Patient was evaluated in the context of the global COVID-19 pandemic, which necessitated consideration that the patient might be at risk for infection with the SARS-CoV-2 virus that causes COVID-19. Institutional protocols and algorithms that pertain to the evaluation of patients at risk for COVID-19 are in a state of rapid change based on information released by regulatory bodies including the CDC and federal and state organizations. These policies and algorithms were followed during the patient's care.

## 2019-06-22 NOTE — ED Notes (Signed)
Pt given sandwich bag and coke

## 2019-06-23 ENCOUNTER — Encounter (HOSPITAL_COMMUNITY): Payer: Self-pay | Admitting: Internal Medicine

## 2019-06-23 ENCOUNTER — Other Ambulatory Visit: Payer: Self-pay

## 2019-06-23 ENCOUNTER — Observation Stay (HOSPITAL_BASED_OUTPATIENT_CLINIC_OR_DEPARTMENT_OTHER): Payer: Medicare HMO

## 2019-06-23 ENCOUNTER — Observation Stay (HOSPITAL_COMMUNITY): Payer: Medicare HMO

## 2019-06-23 DIAGNOSIS — R42 Dizziness and giddiness: Secondary | ICD-10-CM | POA: Diagnosis not present

## 2019-06-23 DIAGNOSIS — I132 Hypertensive heart and chronic kidney disease with heart failure and with stage 5 chronic kidney disease, or end stage renal disease: Secondary | ICD-10-CM | POA: Diagnosis not present

## 2019-06-23 DIAGNOSIS — N185 Chronic kidney disease, stage 5: Secondary | ICD-10-CM | POA: Diagnosis not present

## 2019-06-23 DIAGNOSIS — R079 Chest pain, unspecified: Secondary | ICD-10-CM | POA: Diagnosis not present

## 2019-06-23 DIAGNOSIS — E1122 Type 2 diabetes mellitus with diabetic chronic kidney disease: Secondary | ICD-10-CM | POA: Diagnosis not present

## 2019-06-23 DIAGNOSIS — I5042 Chronic combined systolic (congestive) and diastolic (congestive) heart failure: Secondary | ICD-10-CM | POA: Diagnosis not present

## 2019-06-23 DIAGNOSIS — R609 Edema, unspecified: Secondary | ICD-10-CM

## 2019-06-23 DIAGNOSIS — Z20822 Contact with and (suspected) exposure to covid-19: Secondary | ICD-10-CM | POA: Diagnosis not present

## 2019-06-23 DIAGNOSIS — G4733 Obstructive sleep apnea (adult) (pediatric): Secondary | ICD-10-CM | POA: Diagnosis not present

## 2019-06-23 DIAGNOSIS — R0602 Shortness of breath: Secondary | ICD-10-CM | POA: Diagnosis not present

## 2019-06-23 LAB — CBC
HCT: 33.6 % — ABNORMAL LOW (ref 39.0–52.0)
Hemoglobin: 10.6 g/dL — ABNORMAL LOW (ref 13.0–17.0)
MCH: 27.5 pg (ref 26.0–34.0)
MCHC: 31.5 g/dL (ref 30.0–36.0)
MCV: 87.3 fL (ref 80.0–100.0)
Platelets: 188 10*3/uL (ref 150–400)
RBC: 3.85 MIL/uL — ABNORMAL LOW (ref 4.22–5.81)
RDW: 14.1 % (ref 11.5–15.5)
WBC: 5.5 10*3/uL (ref 4.0–10.5)
nRBC: 0 % (ref 0.0–0.2)

## 2019-06-23 LAB — COMPREHENSIVE METABOLIC PANEL
ALT: 14 U/L (ref 0–44)
AST: 13 U/L — ABNORMAL LOW (ref 15–41)
Albumin: 3.7 g/dL (ref 3.5–5.0)
Alkaline Phosphatase: 29 U/L — ABNORMAL LOW (ref 38–126)
Anion gap: 14 (ref 5–15)
BUN: 45 mg/dL — ABNORMAL HIGH (ref 6–20)
CO2: 25 mmol/L (ref 22–32)
Calcium: 8.9 mg/dL (ref 8.9–10.3)
Chloride: 101 mmol/L (ref 98–111)
Creatinine, Ser: 6.28 mg/dL — ABNORMAL HIGH (ref 0.61–1.24)
GFR calc Af Amer: 10 mL/min — ABNORMAL LOW (ref >60)
GFR calc non Af Amer: 9 mL/min — ABNORMAL LOW (ref >60)
Glucose, Bld: 83 mg/dL (ref 70–99)
Potassium: 3.5 mmol/L (ref 3.5–5.1)
Sodium: 140 mmol/L (ref 135–145)
Total Bilirubin: 0.6 mg/dL (ref 0.3–1.2)
Total Protein: 5.8 g/dL — ABNORMAL LOW (ref 6.5–8.1)

## 2019-06-23 LAB — HEMOGLOBIN A1C
Hgb A1c MFr Bld: 6 % — ABNORMAL HIGH (ref 4.8–5.6)
Mean Plasma Glucose: 125.5 mg/dL

## 2019-06-23 LAB — GLUCOSE, CAPILLARY
Glucose-Capillary: 100 mg/dL — ABNORMAL HIGH (ref 70–99)
Glucose-Capillary: 94 mg/dL (ref 70–99)

## 2019-06-23 LAB — HIV ANTIBODY (ROUTINE TESTING W REFLEX): HIV Screen 4th Generation wRfx: NONREACTIVE

## 2019-06-23 LAB — TROPONIN I (HIGH SENSITIVITY)
Troponin I (High Sensitivity): 21 ng/L — ABNORMAL HIGH (ref <18)
Troponin I (High Sensitivity): 23 ng/L — ABNORMAL HIGH (ref <18)

## 2019-06-23 LAB — ECHOCARDIOGRAM COMPLETE
Height: 74 in
Weight: 4684.8 oz

## 2019-06-23 LAB — MAGNESIUM: Magnesium: 1.4 mg/dL — ABNORMAL LOW (ref 1.7–2.4)

## 2019-06-23 LAB — T3: T3, Total: 77 ng/dL (ref 71–180)

## 2019-06-23 LAB — PHOSPHORUS: Phosphorus: 4.9 mg/dL — ABNORMAL HIGH (ref 2.5–4.6)

## 2019-06-23 MED ORDER — TECHNETIUM TC 99M DIETHYLENETRIAME-PENTAACETIC ACID
41.0000 | Freq: Once | INTRAVENOUS | Status: AC | PRN
  Administered 2019-06-23: 41 via INTRAVENOUS

## 2019-06-23 MED ORDER — TECHNETIUM TO 99M ALBUMIN AGGREGATED
1.6500 | Freq: Once | INTRAVENOUS | Status: AC | PRN
  Administered 2019-06-23: 1.65 via INTRAVENOUS

## 2019-06-23 MED ORDER — FUROSEMIDE 80 MG PO TABS: 160.0000 mg | ORAL_TABLET | ORAL | Status: DC

## 2019-06-23 NOTE — Progress Notes (Signed)
  Echocardiogram 2D Echocardiogram has been performed.  Craig Flynn 06/23/2019, 10:24 AM

## 2019-06-23 NOTE — Discharge Summary (Signed)
Discharge Summary  Craig Flynn TAV:[id number] DOB: [birth year]  PCP: Biagio Borg, MD  Admit date: 06/22/2019 Discharge date: 06/23/2019  Time spent: 25 minutes   Recommendations for Outpatient Follow-up:  1. Patient will follow up with [facility] on 4/8 for outpatient stress test   Discharge Diagnoses:  Active Hospital Problems   Diagnosis Date Noted  . Chest pain 11/01/2018  . CKD (chronic kidney disease), stage V ([place]) 06/22/2019  . Chronic combined systolic and diastolic CHF (congestive heart failure) ([place]) 06/22/2019  . Abnormal TSH 02/26/2019  . OSA (obstructive sleep apnea) 12/27/2014  . Morbid obesity ([place]) 11/17/2013  . Diabetes mellitus ([place]) 12/12/2011  . HTN (hypertension) 12/11/2011  . SOB (shortness of breath) 12/11/2011    Resolved Hospital Problems  No resolved problems to display.    Discharge Condition: Improved, being discharged to home  Diet recommendation: Heart healthy   Vitals:   06/23/19 0337 06/23/19 0827  BP: (!) 161/76 (!) 146/69  Pulse: 72 71  Resp: 16   Temp: 98.2 F (36.8 C) 98.2 F (36.8 C)  SpO2: 98% 100%    History of present illness:  59 y.o.male with medical history significant of CKD stage V, HTN, morbid obesity & OSA presented to the hospital on night of 4/1 with complaints SOB, lightheadedness & chest pain which had started around noon earlier that day.   Initial EKG & enzymes were unremarkable.  Brought in to the hospitalist service.  Hospital Course:  Principal Problem:   Chest pain with associated dizziness & dyspnea: Chest pain resolved after he arrived to the floor.  Seen by cardiology.  He underwent a V/Q & DVT study on 4/2.  (Could not get a CT scan due to renal disease.)  DVT & PE ruled out.  Given patient's relatively normal echocardiogram and improvement in symptoms, cardiology felt that he could get a stress test done as an outpatient with preferred imaging in their office.  Patient was in favor of this.  Enzymes  x3 all normal.  Patient was discharged home. Active Problems:   HTN (hypertension)    Diabetes mellitus ([place]), well controlled with associated renal disease: A1C notes excellent control at 6.0.  Continue home meds    Morbid obesity ([place]): pt meets criteria with BMI >35 + HTN & diabetes mellitus    OSA (obstructive sleep apnea): recommending outpatient sleep study followup.  He admits to being non-compliant w/CPAP at home    Abnormal elevated TSH: however, Free T4 within normal limits.  Would recommend pt follow up with his primary care doctor and recheck in 3 months.  Would not call this subacute hypothyroidism unless labs similar in outpatient setting after some time has passed.    CKD (chronic kidney disease), stage V ([place]): Followed by nephrology as an outpatient at this point no access available. Fistula placed but never worked   Chronic  diastolic CHF (congestive heart failure) ([place]): Appears to be stable.  BNP stable & no signs of acute exacerbation.  Patient in the past been told he also has systolic heart failure.  Echocardiogram repeated today only noted evidence of mild grade 1 diastolic dysfunction   Procedures:  Echocardiogram done 4/2: Grade 1 diastolic dysfunction, preserved ejection fraction  Lower extremity Dopplers done 4/2: No evidence of DVT in either leg  Consultations:  Cardiology  Discharge Exam: BP (!) 146/69 (BP Location: Right Arm)   Pulse 71   Temp 98.2 F (36.8 C) (Oral)   Resp 16   Ht  6\' 2"  (1.88 m)   Wt 132.8 kg   SpO2 100%   BMI 37.59 kg/m   General: Alert and oriented x3, no acute distress Cardiovascular: Regular rate and rhythm, S1-S2 Respiratory: Clear to auscultation bilaterally  Discharge Instructions You were cared for by a hospitalist during your hospital stay. If you have any questions about your discharge medications or the care you received while you were in the hospital after you are discharged, you can call the unit and asked to  speak with the hospitalist on call if the hospitalist that took care of you is not available. Once you are discharged, your primary care physician will handle any further medical issues. Please note that NO REFILLS for any discharge medications will be authorized once you are discharged, as it is imperative that you return to your primary care physician (or establish a relationship with a primary care physician if you do not have one) for your aftercare needs so that they can reassess your need for medications and monitor your lab values.  Discharge Instructions    Diet - low sodium heart healthy   Complete by: As directed    Increase activity slowly   Complete by: As directed      Allergies as of 06/23/2019   No Known Allergies     Medication List    STOP taking these medications   iron polysaccharides 150 MG capsule Commonly known as: Nu-Iron     TAKE these medications   acetaminophen 500 MG tablet Commonly known as: TYLENOL Take 2 tablets (1,000 mg total) by mouth every 8 (eight) hours as needed for mild pain or headache.   albuterol 108 (90 Base) MCG/ACT inhaler Commonly known as: VENTOLIN HFA Inhale 2 puffs into the lungs every 6 (six) hours as needed for wheezing or shortness of breath.   allopurinol 100 MG tablet Commonly known as: ZYLOPRIM Take 150 mg by mouth daily.   aspirin EC 81 MG tablet Take 81 mg by mouth daily.   calcitRIOL 0.5 MCG capsule Commonly known as: ROCALTROL Take 0.5 mcg by mouth every Monday, Wednesday, and Friday.   carvedilol 25 MG tablet Commonly known as: COREG TAKE 1 TABLET(25 MG) BY MOUTH TWICE DAILY What changed: See the new instructions.   fenofibrate 54 MG tablet TAKE 1 TABLET BY MOUTH EVERY DAY   furosemide 80 MG tablet Commonly known as: LASIX Take 2 tablets (160 mg total) by mouth See admin instructions. Take 160 mg by mouth in the morning and 160 mg in the afternoon   isosorbide mononitrate 30 MG 24 hr tablet Commonly known  as: IMDUR TAKE 2 TABLETS(60 MG) BY MOUTH DAILY What changed:   how much to take  how to take this  when to take this  additional instructions   levothyroxine 137 MCG tablet Commonly known as: SYNTHROID Take 137 mcg by mouth daily before breakfast.   nitroGLYCERIN 0.4 MG SL tablet Commonly known as: NITROSTAT DISSOLVE 1 TABLET UNDER THE TONGUE EVERY 5 MINUTES AS NEEDED FOR CHEST PAIN What changed: See the new instructions.   pantoprazole 40 MG tablet Commonly known as: PROTONIX Take 1 tablet (40 mg total) by mouth daily. What changed: when to take this   potassium chloride SA 20 MEQ tablet Commonly known as: KLOR-CON Take 20 mEq by mouth daily.   sodium bicarbonate 650 MG tablet Take 1 tablet (650 mg total) by mouth 3 (three) times daily.   Tums Ultra 1000 400 MG chewable tablet Generic drug: calcium elemental  as carbonate Chew 1,000 mg by mouth as needed for heartburn.      No Known Allergies Follow-up Information    Minus Breeding, MD Follow up on 06/29/2019.   Specialty: Cardiology Why: Hospital follow-up April 8th @ 8:40AM Contact information: [address] [place] [address] [phone]            The results of significant diagnostics from this hospitalization (including imaging, microbiology, ancillary and laboratory) are listed below for reference.    Significant Diagnostic Studies: DG Chest 2 View  Result Date: 06/22/2019 CLINICAL DATA:  Chest pain, short of breath, syncope EXAM: CHEST - 2 VIEW COMPARISON:  05/04/2019 FINDINGS: Frontal and lateral views of the chest demonstrate a stable cardiac silhouette. Mild stable interstitial prominence may reflect scarring. No airspace disease, effusion, or pneumothorax. No acute bony abnormalities. IMPRESSION: 1. Stable exam, no acute process. Electronically Signed   By: Randa Ngo M.D.   On: 06/22/2019 15:23   NM Pulmonary Perf and Vent  Result Date: 06/23/2019 CLINICAL DATA:  Shortness  of breath and chest pain EXAM: NUCLEAR MEDICINE VENTILATION - PERFUSION LUNG SCAN VIEWS: Anterior, posterior, left lateral, right lateral, RPO, LPO, RAO, LAO-ventilation and perfusion RADIOPHARMACEUTICALS:  41.0 mCi of Tc-14m DTPA aerosol inhalation and 1.65 mCi Tc58m MAA IV COMPARISON:  Chest radiograph June 22, 2019 FINDINGS: Ventilation: Radiotracer uptake is homogeneous and symmetric bilaterally. No ventilation lesions evident. Perfusion: Radiotracer uptake is homogeneous and symmetric bilaterally. No evident perfusion defects. IMPRESSION: No appreciable ventilation or perfusion defects. Very low probability of pulmonary embolus. Electronically Signed   By: Lowella Grip III M.D.   On: 06/23/2019 11:42   ECHOCARDIOGRAM COMPLETE  Result Date: 06/23/2019    ECHOCARDIOGRAM REPORT   Patient Name:   Craig Flynn Date of Exam: 06/23/2019 Medical Rec #:  [id number]     Height:       74.0 in Accession #:    [id number]    Weight:       292.8 lb Date of Birth:  01-11-1961    BSA:          2.556 m Patient Age:    59 years      BP:           146/69 mmHg Patient Gender: M             HR:           71 bpm. Exam Location:  Inpatient Procedure: 2D Echo, Cardiac Doppler and Color Doppler Indications:    Chest pain  History:        Patient has prior history of Echocardiogram examinations, most                 recent 09/22/2014. CHF, Signs/Symptoms:Shortness of Breath and                 Chest Pain; Risk Factors:Hypertension and Morbid obesity. CKD.  Sonographer:    Dustin Flock Referring Phys: [place]  1. Normal LV systolic function; mild LVH; grade 1 diastolic dysfunction.  2. Left ventricular ejection fraction, by estimation, is 55 to 60%. The left ventricle has normal function. The left ventricle has no regional wall motion abnormalities. There is mild left ventricular hypertrophy. Left ventricular diastolic parameters are consistent with Grade I diastolic dysfunction (impaired relaxation).   3. Right ventricular systolic function is normal. The right ventricular size is normal. Tricuspid regurgitation signal is inadequate for assessing PA pressure.  4. The mitral valve is  normal in structure. No evidence of mitral valve regurgitation. No evidence of mitral stenosis.  5. The aortic valve is tricuspid. Aortic valve regurgitation is not visualized. Mild aortic valve sclerosis is present, with no evidence of aortic valve stenosis.  6. The inferior vena cava is normal in size with greater than 50% respiratory variability, suggesting right atrial pressure of 3 mmHg. FINDINGS  Left Ventricle: Left ventricular ejection fraction, by estimation, is 55 to 60%. The left ventricle has normal function. The left ventricle has no regional wall motion abnormalities. The left ventricular internal cavity size was normal in size. There is  mild left ventricular hypertrophy. Left ventricular diastolic parameters are consistent with Grade I diastolic dysfunction (impaired relaxation). Right Ventricle: The right ventricular size is normal.Right ventricular systolic function is normal. Tricuspid regurgitation signal is inadequate for assessing PA pressure. The tricuspid regurgitant velocity is 1.74 m/s, and with an assumed right atrial pressure of 3 mmHg, the estimated right ventricular systolic pressure is 95.0 mmHg. Left Atrium: Left atrial size was normal in size. Right Atrium: Right atrial size was normal in size. Pericardium: There is no evidence of pericardial effusion. Mitral Valve: The mitral valve is normal in structure. Normal mobility of the mitral valve leaflets. No evidence of mitral valve regurgitation. No evidence of mitral valve stenosis. Tricuspid Valve: The tricuspid valve is normal in structure. Tricuspid valve regurgitation is trivial. No evidence of tricuspid stenosis. Aortic Valve: The aortic valve is tricuspid. Aortic valve regurgitation is not visualized. Mild aortic valve sclerosis is present, with no  evidence of aortic valve stenosis. Pulmonic Valve: The pulmonic valve was not well visualized. Pulmonic valve regurgitation is not visualized. No evidence of pulmonic stenosis. Aorta: The aortic root is normal in size and structure. Venous: The inferior vena cava is normal in size with greater than 50% respiratory variability, suggesting right atrial pressure of 3 mmHg.  Additional Comments: Normal LV systolic function; mild LVH; grade 1 diastolic dysfunction.  LEFT VENTRICLE PLAX 2D LVIDd:         4.80 cm  Diastology LVIDs:         3.70 cm  LV e' lateral:   7.72 cm/s LV PW:         1.40 cm  LV E/e' lateral: 5.1 LV IVS:        1.30 cm  LV e' medial:    6.42 cm/s LVOT diam:     2.60 cm  LV E/e' medial:  6.1 LV SV:         81 LV SV Index:   32 LVOT Area:     5.31 cm  RIGHT VENTRICLE RV Basal diam:  3.10 cm RV S prime:     7.07 cm/s TAPSE (M-mode): 2.8 cm LEFT ATRIUM             Index       RIGHT ATRIUM           Index LA diam:        3.90 cm 1.53 cm/m  RA Area:     15.80 cm LA Vol (A2C):   48.3 ml 18.90 ml/m RA Volume:   48.30 ml  18.90 ml/m LA Vol (A4C):   65.3 ml 25.55 ml/m LA Biplane Vol: 60.8 ml 23.79 ml/m  AORTIC VALVE LVOT Vmax:   73.10 cm/s LVOT Vmean:  47.900 cm/s LVOT VTI:    0.153 m  AORTA Ao Root diam: 2.90 cm MITRAL VALVE  TRICUSPID VALVE MV Area (PHT): 2.69 cm    TR Peak grad:   12.1 mmHg MV Decel Time: 282 msec    TR Vmax:        174.00 cm/s MV E velocity: 39.40 cm/s MV A velocity: 63.40 cm/s  SHUNTS MV E/A ratio:  0.62        Systemic VTI:  0.15 m                            Systemic Diam: 2.60 cm Kirk Ruths MD Electronically signed by Kirk Ruths MD Signature Date/Time: 06/23/2019/12:24:36 PM    Final    VAS [place] LOWER EXTREMITY VENOUS (DVT)  Result Date: 06/23/2019  Lower Venous DVTStudy Indications: Edema.  Risk Factors: None identified. Limitations: Body habitus and poor ultrasound/tissue interface. Comparison Study: No prior studies. Performing Technologist: Oliver Hum RVT  Examination Guidelines: A complete evaluation includes B-mode imaging, spectral Doppler, color Doppler, and power Doppler as needed of all accessible portions of each vessel. Bilateral testing is considered an integral part of a complete examination. Limited examinations for reoccurring indications may be performed as noted. The reflux portion of the exam is performed with the patient in reverse Trendelenburg.  +---------+---------------+---------+-----------+----------+--------------+ RIGHT    CompressibilityPhasicitySpontaneityPropertiesThrombus Aging +---------+---------------+---------+-----------+----------+--------------+ CFV      Full           Yes      Yes                                 +---------+---------------+---------+-----------+----------+--------------+ SFJ      Full                                                        +---------+---------------+---------+-----------+----------+--------------+ FV Prox  Full                                                        +---------+---------------+---------+-----------+----------+--------------+ FV Mid   Full                                                        +---------+---------------+---------+-----------+----------+--------------+ FV DistalFull                                                        +---------+---------------+---------+-----------+----------+--------------+ PFV      Full                                                        +---------+---------------+---------+-----------+----------+--------------+ POP      Full  Yes      Yes                                 +---------+---------------+---------+-----------+----------+--------------+ PTV      Full                                                        +---------+---------------+---------+-----------+----------+--------------+ PERO     Full                                                         +---------+---------------+---------+-----------+----------+--------------+   +---------+---------------+---------+-----------+----------+--------------+ LEFT     CompressibilityPhasicitySpontaneityPropertiesThrombus Aging +---------+---------------+---------+-----------+----------+--------------+ CFV      Full           Yes      Yes                                 +---------+---------------+---------+-----------+----------+--------------+ SFJ      Full                                                        +---------+---------------+---------+-----------+----------+--------------+ FV Prox  Full                                                        +---------+---------------+---------+-----------+----------+--------------+ FV Mid   Full                                                        +---------+---------------+---------+-----------+----------+--------------+ FV DistalFull                                                        +---------+---------------+---------+-----------+----------+--------------+ PFV      Full                                                        +---------+---------------+---------+-----------+----------+--------------+ POP      Full           Yes      Yes                                 +---------+---------------+---------+-----------+----------+--------------+ PTV  Full                                                        +---------+---------------+---------+-----------+----------+--------------+ PERO                                                  Not visualized +---------+---------------+---------+-----------+----------+--------------+     Summary: RIGHT: - There is no evidence of deep vein thrombosis in the lower extremity.  - No cystic structure found in the popliteal fossa.  LEFT: - There is no evidence of deep vein thrombosis in the lower extremity. However, portions of this examination were limited-  see technologist comments above.  - No cystic structure found in the popliteal fossa.  *See table(s) above for measurements and observations. Electronically signed by Curt Jews MD on 06/23/2019 at 2:21:23 PM.    Final     Microbiology: Recent Results (from the past 240 hour(s))  SARS CORONAVIRUS 2 (TAT 6-24 HRS) Nasopharyngeal Nasopharyngeal Swab     Status: None   Collection Time: 06/22/19  6:22 PM   Specimen: Nasopharyngeal Swab  Result Value Ref Range Status   SARS Coronavirus 2 NEGATIVE NEGATIVE Final    Comment: (NOTE) SARS-CoV-2 target nucleic acids are NOT DETECTED. The SARS-CoV-2 RNA is generally detectable in upper and lower respiratory specimens during the acute phase of infection. Negative results do not preclude SARS-CoV-2 infection, do not rule out co-infections with other pathogens, and should not be used as the sole basis for treatment or other patient management decisions. Negative results must be combined with clinical observations, patient history, and epidemiological information. The expected result is Negative. Fact Sheet for Patients: SugarRoll.be Fact Sheet for Healthcare Providers: https://www.woods-mathews.com/ This test is not yet approved or cleared by the [place] FDA and  has been authorized for detection and/or diagnosis of SARS-CoV-2 by FDA under an Emergency Use Authorization (EUA). This EUA will remain  in effect (meaning this test can be used) for the duration of the COVID-19 declaration under Section 56 4(b)(1) of the Act, 21 U.S.C. section 360bbb-3(b)(1), unless the authorization is terminated or revoked sooner. Performed at [facility], [place] [address], [place], [place] [address]      Labs: Basic Metabolic Panel: Recent Labs  Lab 06/22/19 1459 06/23/19 0132  NA 139 140  K 3.9 3.5  CL 102 101  CO2 23 25  GLUCOSE 100* 83  BUN 44* 45*  CREATININE 6.09* 6.28*  CALCIUM 9.1 8.9  MG  --   1.4*  PHOS  --  4.9*   Liver Function Tests: Recent Labs  Lab 06/23/19 0132  AST 13*  ALT 14  ALKPHOS 29*  BILITOT 0.6  PROT 5.8*  ALBUMIN 3.7   No results for input(s): LIPASE, AMYLASE in the last 168 hours. No results for input(s): AMMONIA in the last 168 hours. CBC: Recent Labs  Lab 06/22/19 1459 06/23/19 0132  WBC 5.8 5.5  HGB 11.6* 10.6*  HCT 37.3* 33.6*  MCV 88.0 87.3  PLT 206 188   Cardiac Enzymes: No results for input(s): CKTOTAL, CKMB, CKMBINDEX, TROPONINI in the last 168 hours. BNP: BNP (last 3 results) Recent Labs    06/22/19 1740  BNP 49.9    ProBNP (last 3 results) No results for input(s): PROBNP in the last 8760 hours.  CBG: Recent Labs  Lab 06/23/19 0618 06/23/19 1223  GLUCAP 100* 94       Signed:  Annita Brod, MD [facility] 06/23/2019, 4:46 PM

## 2019-06-23 NOTE — Progress Notes (Signed)
Bilateral lower extremity venous duplex has been completed. Preliminary results can be found in CV Proc through chart review.   06/23/19 12:46 PM Craig Flynn RVT

## 2019-06-23 NOTE — Progress Notes (Signed)
Progress Note  Patient Name: Craig Flynn Date of Encounter: 06/23/2019  Primary Cardiologist: Minus Breeding, MD   Subjective   V/Q scan and DVT study planned for today. Echo results pending. Patient did have some intermittent dull chest tightness/sob overnight.   Inpatient Medications    Scheduled Meds: . allopurinol  150 mg Oral Daily  . calcitRIOL  0.5 mcg Oral Q M,W,F  . carvedilol  25 mg Oral BID WC  . fenofibrate  54 mg Oral Daily  . furosemide  160 mg Oral BID WC  . heparin  5,000 Units Subcutaneous Q8H  . insulin aspart  0-9 Units Subcutaneous TID WC  . isosorbide mononitrate  30 mg Oral Daily  . [START ON 06/24/2019] levothyroxine  125 mcg Oral Q0600  . pantoprazole  40 mg Oral BID AC  . sodium chloride flush  3 mL Intravenous Once  . sodium chloride flush  3 mL Intravenous Q12H   Continuous Infusions: . sodium chloride     PRN Meds: sodium chloride, acetaminophen **OR** acetaminophen, albuterol, HYDROcodone-acetaminophen, ondansetron **OR** ondansetron (ZOFRAN) IV, sodium chloride flush   Vital Signs    Vitals:   06/22/19 2030 06/22/19 2059 06/23/19 0027 06/23/19 0337  BP: (!) 184/81 (!) 189/102 134/69 (!) 161/76  Pulse:  60 70 72  Resp: 16 18 16 16   Temp:  98.6 F (37 C) 98.1 F (36.7 C) 98.2 F (36.8 C)  TempSrc:  Oral Oral Oral  SpO2:  100% 100% 98%  Weight:  132.9 kg  132.8 kg  Height:  6\' 2"  (1.88 m)      Intake/Output Summary (Last 24 hours) at 06/23/2019 0824 Last data filed at 06/23/2019 0344 Gross per 24 hour  Intake 480 ml  Output 600 ml  Net -120 ml   Last 3 Weights 06/23/2019 06/22/2019 05/26/2019  Weight (lbs) 292 lb 12.8 oz 293 lb 1.6 oz 304 lb  Weight (kg) 132.813 kg 132.949 kg 137.893 kg      Telemetry    Sinus rhythm, HR 60s, firs degree AV block, PVCs - Personally Reviewed  ECG    NSR, 68 bpm, first degree AV lbock, PVC, LVH - Personally Reviewed  Physical Exam   GEN: No acute distress.   Neck: No JVD Cardiac: RRR, no  murmurs, rubs, or gallops.  Respiratory: diffusely diminished GI: Soft, nontender, non-distended  MS: Trace edema; No deformity. Neuro:  Nonfocal  Psych: Normal affect   Labs    High Sensitivity Troponin:   Recent Labs  Lab 06/22/19 1459 06/22/19 1730 06/22/19 2344 06/23/19 0132  TROPONINIHS 17 18* 23* 21*      Chemistry Recent Labs  Lab 06/22/19 1459 06/23/19 0132  NA 139 140  K 3.9 3.5  CL 102 101  CO2 23 25  GLUCOSE 100* 83  BUN 44* 45*  CREATININE 6.09* 6.28*  CALCIUM 9.1 8.9  PROT  --  5.8*  ALBUMIN  --  3.7  AST  --  13*  ALT  --  14  ALKPHOS  --  29*  BILITOT  --  0.6  GFRNONAA 9* 9*  GFRAA 11* 10*  ANIONGAP 14 14     Hematology Recent Labs  Lab 06/22/19 1459 06/23/19 0132  WBC 5.8 5.5  RBC 4.24 3.85*  HGB 11.6* 10.6*  HCT 37.3* 33.6*  MCV 88.0 87.3  MCH 27.4 27.5  MCHC 31.1 31.5  RDW 14.1 14.1  PLT 206 188    BNP Recent Labs  Lab 06/22/19 1740  BNP 49.9     DDimer No results for input(s): DDIMER in the last 168 hours.   Radiology    DG Chest 2 View  Result Date: 06/22/2019 CLINICAL DATA:  Chest pain, short of breath, syncope EXAM: CHEST - 2 VIEW COMPARISON:  05/04/2019 FINDINGS: Frontal and lateral views of the chest demonstrate a stable cardiac silhouette. Mild stable interstitial prominence may reflect scarring. No airspace disease, effusion, or pneumothorax. No acute bony abnormalities. IMPRESSION: 1. Stable exam, no acute process. Electronically Signed   By: Randa Ngo M.D.   On: 06/22/2019 15:23    Cardiac Studies   Myoview stress test 2017 IMPRESSION: 1. Fixed defect in the inferior wall.  2. Inferior hypokinesis.  3. Left ventricular ejection fraction 47%  4. Intermediate-risk stress test findings*.  Echo 2016 Study Conclusions   - Left ventricle: The cavity size was normal. There was mild  concentric hypertrophy. Systolic function was normal. The  estimated ejection fraction was in the range of 55%  to 60%.  Images were inadequate for LV wall motion assessment. However,  there were no gross abnormalities noted on available images.  Doppler parameters are consistent with abnormal left ventricular  relaxation (grade 1 diastolic dysfunction).  - Left atrium: The atrium was mildly dilated.   Patient Profile     59 y.o. male with pmh of CKD stage 4-5, HTN, hypothyroidism s/p thyroidectomy, chronic diastolic HF, OSA non compliant with CPAP, morbid obesity who is being seen for chest pain/sob/lightheadedness.   Assessment & Plan    Lightheadedness/dizziness Patient presented with 30 second episode of lightheadedness, no syncope. Had some SOB/chest pain after the episode.  - tele shows sinus with PVCs and transient first degree AV block>>can plan for heart monitor  - V/Q scan and DVT study planned for today - Echo ordered - If no PE tentative plan for OP stress test  Atypical chest pain/SOB - stress test in 2017 likely normal (inferior defect possibly attenuation artifact from diaphragm) and no active ischemia - Echo in 2016 with no WMA. Repeat echo ordered - EKG unchanged form prior. HS troponin peaked at 23 - Can plan for OP stress test as above. No cath 2/2 to kidney function  CKD stage 4-5 - creatinine 6.09 on admission - baseline around 5.5  Chronic diastolic HF/CM - Patient had mildly reduced EF of 45% in the past. EF in Julry 2016 said to be 55%. - repeat echo ordered - patient has intermittent leg swelling for which he takes lasix  HLD - fenofibrate - LDL 96 09/2018  HTN - coreg and Imdur  For questions or updates, please contact [facility] Please consult [email or web address] for contact info under        Signed, Mikel Pyon Ninfa Meeker, PA-C  06/23/2019, 8:24 AM

## 2019-06-24 ENCOUNTER — Ambulatory Visit: Payer: Medicare HMO | Attending: Internal Medicine

## 2019-06-24 DIAGNOSIS — Z23 Encounter for immunization: Secondary | ICD-10-CM

## 2019-06-24 NOTE — Progress Notes (Signed)
   Covid-19 Vaccination Clinic  Name:  Craig Flynn    MRN: [id number] DOB: [birth year]  06/24/2019  [name] was observed post Covid-19 immunization for 15 minutes without incident. He was provided with Vaccine Information Sheet and instruction to access the V-Safe system.   [name] was instructed to call 911 with any severe reactions post vaccine: Marland Kitchen Difficulty breathing  . Swelling of face and throat  . A fast heartbeat  . A bad rash all over body  . Dizziness and weakness   Immunizations Administered    Name Date Dose VIS Date Route   Pfizer COVID-19 Vaccine 06/24/2019  3:42 PM 0.3 mL 03/03/2019 Intramuscular   Manufacturer: [place]   Lot: [id number]   [place]: [id number]

## 2019-06-28 DIAGNOSIS — Z7189 Other specified counseling: Secondary | ICD-10-CM | POA: Insufficient documentation

## 2019-06-28 NOTE — Progress Notes (Signed)
Cardiology Office Note   Date:  06/29/2019   ID:  Craig Flynn, DOB 08-29-1960, MRN [id number]  PCP:  Biagio Borg, MD  Cardiologist:   Minus Breeding, MD   Chief Complaint  Patient presents with  . Chest Pain     History of Present Illness: Craig Flynn is a 59 y.o. male who presents for evaluation of cardiomyopathy and hypertension. The patient has long-standing hypertension. He was seen a couple of years ago with mildly reduced ejection fraction of about 45%. In July 2016 his EF was said to be about 55%.  Last year he was in the hospital with chest pain.  He had a a perfusion defect that was fixed in the inferior wall with an EF of 47%.   There was no active ischemia.    Since I last saw him he was in the hospital with chest pain.  I reviewed these records for this visit.    He had no evidence of ischemia.  He had a negative VQ scan.  The plan was for an out patient perfusion study.  Echo was unremarkable.     He says that since discharge he has not had any further pain.  The pain was mid chest.  It was a pressure.  It would come and go at rest.  It sounds like he is relatively sedentary but he was not bringing his normal activity.  It might last 3 to 4 minutes.  He went to the emergency room because he got very lightheaded with one episode.  However, he has not had it since discharge from the hospital.  He is not had any presyncope or syncope.  He said he had a little diaphoresis with this but that he would have no other symptoms such as nausea vomiting or diaphoresis.  He has not had any new shortness of breath, PND or orthopnea.  He has had no weight gain or edema.  He thinks this is similar to previous discomfort he had in the past but a little more intense.  Past Medical History:  Diagnosis Date  . Anemia 06/2015  . Arthritis    knee  . Cancer [facility])    thyroid cancer  . Cardiomyopathy ([place])    a. 10/2013: EF reduced to 35-40% b. 09/2014: EF improved to 55-60%, Grade 1 DD  noted.  . Chest pain 06/2015  . CHF (congestive heart failure) ([place])   . CKD (chronic kidney disease), stage IV [facility])    sees  Dr Lorrene Reid  . Dyspnea    with exerion   . GERD (gastroesophageal reflux disease)   . Gout   . Hypertension   . Obesity   . Sleep apnea    not wearing c-pap now, needs new slep study per pt. (01/22/2017)  . Tuberculosis 1981   while the pt was in the service.  . Tubular adenoma of colon 10/2015  . Wears glasses     Past Surgical History:  Procedure Laterality Date  . AV FISTULA PLACEMENT Left 03/25/2016   Procedure: Creation of Left Arm ARTERIOVENOUS (AV) FISTULA;  Surgeon: Elam Dutch, MD;  Location: [facility] OR;  Service: Vascular;  Laterality: Left;  . COLONOSCOPY W/ BIOPSIES AND POLYPECTOMY     "no problem"  . HERNIA REPAIR    . LAPAROSCOPIC CHOLECYSTECTOMY    . LIGATION OF COMPETING BRANCHES OF ARTERIOVENOUS FISTULA Left 01/25/2018   Procedure: LIGATION OF COMPETING BRANCHES And Revision of ARTERIOVENOUS FISTULA LEFT ARM.;  Surgeon:  Elam Dutch, MD;  Location: [facility] OR;  Service: Vascular;  Laterality: Left;  . THYROIDECTOMY  01/22/2017  . THYROIDECTOMY Left 01/22/2017   Procedure: THYROIDECTOMY;  Surgeon: Melida Quitter, MD;  Location: [place];  Service: ENT;  Laterality: Left;  . THYROIDECTOMY Right 03/26/2017   Procedure: COMPLETION OF THYROIDECTOMY;  Surgeon: Melida Quitter, MD;  Location: [place];  Service: ENT;  Laterality: Right;  . UMBILICAL HERNIA REPAIR    . WISDOM TOOTH EXTRACTION       Current Outpatient Medications  Medication Sig Dispense Refill  . acetaminophen (TYLENOL) 500 MG tablet Take 2 tablets (1,000 mg total) by mouth every 8 (eight) hours as needed for mild pain or headache. 60 tablet 0  . albuterol (VENTOLIN HFA) 108 (90 Base) MCG/ACT inhaler Inhale 2 puffs into the lungs every 6 (six) hours as needed for wheezing or shortness of breath. 18 g 5  . allopurinol (ZYLOPRIM) 100 MG tablet Take 150 mg by mouth daily.     Marland Kitchen aspirin EC 81 MG  tablet Take 81 mg by mouth daily.    . calcitRIOL (ROCALTROL) 0.5 MCG capsule Take 0.5 mcg by mouth every Monday, Wednesday, and Friday.    . calcium elemental as carbonate (TUMS ULTRA 1000) 400 MG chewable tablet Chew 1,000 mg by mouth as needed for heartburn.     . carvedilol (COREG) 25 MG tablet TAKE 1 TABLET(25 MG) BY MOUTH TWICE DAILY (Patient taking differently: Take 25 mg by mouth 2 (two) times daily with a meal. ) 180 tablet 2  . fenofibrate 54 MG tablet Take 54 mg by mouth daily.    . furosemide (LASIX) 80 MG tablet Take 2 tablets (160 mg total) by mouth See admin instructions. Take 160 mg by mouth in the morning and 160 mg in the afternoon    . isosorbide mononitrate (IMDUR) 30 MG 24 hr tablet TAKE 2 TABLETS(60 MG) BY MOUTH DAILY (Patient taking differently: Take 30 mg by mouth in the morning and at bedtime. ) 60 tablet 1  . levothyroxine (SYNTHROID) 137 MCG tablet Take 137 mcg by mouth daily before breakfast.    . nitroGLYCERIN (NITROSTAT) 0.4 MG SL tablet DISSOLVE 1 TABLET UNDER THE TONGUE EVERY 5 MINUTES AS NEEDED FOR CHEST PAIN (Patient taking differently: Place 0.4 mg under the tongue every 5 (five) minutes as needed for chest pain. ) 25 tablet 3  . pantoprazole (PROTONIX) 40 MG tablet Take 1 tablet (40 mg total) by mouth daily. (Patient taking differently: Take 40 mg by mouth 2 (two) times daily before a meal. ) 90 tablet 3  . potassium chloride SA (K-DUR,KLOR-CON) 20 MEQ tablet Take 20 mEq by mouth daily.     . sodium bicarbonate 650 MG tablet Take 1 tablet (650 mg total) by mouth 3 (three) times daily. 90 tablet 5   No current facility-administered medications for this visit.    Allergies:   Patient has no known allergies.   ROS:  Please see the history of present illness.   Otherwise, review of systems are positive for back pain.   All other systems are reviewed and negative.    PHYSICAL EXAM: VS:  BP 130/60   Pulse 66   Temp (!) 97.3 F (36.3 C)   Ht 6\' 2"  (1.88 m)   Wt  297 lb 3.2 oz (134.8 kg)   SpO2 98%   BMI 38.16 kg/m  , BMI Body mass index is 38.16 kg/m.  GENERAL:  Well appearing NECK:  No jugular  venous distention, waveform within normal limits, carotid upstroke brisk and symmetric, no bruits, no thyromegaly LUNGS:  Clear to auscultation bilaterally CHEST:  Unremarkable HEART:  PMI not displaced or sustained,S1 and S2 within normal limits, no S3, no S4, no clicks, no rubs, no murmurs ABD:  Flat, positive bowel sounds normal in frequency in pitch, no bruits, no rebound, no guarding, no midline pulsatile mass, no hepatomegaly, no splenomegaly EXT:  2 plus pulses throughout, no edema, no cyanosis no clubbing   EKG:  EKG is ordered today. I did review the EKG from today.  Patient had sinus rhythm, rate 66, axis within normal limits, intervals within normal limits, no acute ST-T wave changes.      Recent Labs: 06/22/2019: B Natriuretic Peptide 49.9; TSH 0.016 06/23/2019: ALT 14; BUN 45; Creatinine, Ser 6.28; Hemoglobin 10.6; Magnesium 1.4; Platelets 188; Potassium 3.5; Sodium 140    Lipid Panel    Component Value Date/Time   CHOL 164 10/14/2018 0933   TRIG 182.0 (H) 10/14/2018 0933   TRIG 98 03/03/2006 0910   HDL 31.10 (L) 10/14/2018 0933   CHOLHDL 5 10/14/2018 0933   VLDL 36.4 10/14/2018 0933   LDLCALC 96 10/14/2018 0933      Wt Readings from Last 3 Encounters:  06/29/19 297 lb 3.2 oz (134.8 kg)  06/23/19 292 lb 12.8 oz (132.8 kg)  05/26/19 (!) 304 lb (137.9 kg)      Other studies Reviewed: Additional studies/ records that were reviewed today include: Hospital records Review of the above records demonstrates:  NA   ASSESSMENT AND PLAN:   CARDIOMYOPATHY:   EF was normal on echo in the hospital.  No further evaluation.   HTN:   Blood pressure is controlled.  No change in therapy.    OBESITY:   He used to weigh 345 pounds but was down to 281 point.  He is can work on getting back down but I did congratulate him on being less than  300.   CKD:    Creat was 6.28 at the last visit.  He is not yet requiring dialysis.  He has had failed dialysis fistulas.    CHEST PAIN:    This is atypical and has not had any since the hospital.  I am going to bring him back and try to do a POET (Plain Old Exercise Treadmill)  SLEEP APNEA: He is getting a sleep study.  COVID EDUCATION: He has had dose one of his vaccine.   Current medicines are reviewed at length with the patient today.  The patient does not have concerns regarding medicines.  The following changes have Labs/ tests ordered today include:  None  Orders Placed This Encounter  Procedures  . EXERCISE TOLERANCE TEST (ETT)  . EKG 12-Lead     Disposition:   FU with APP in about 3 months. Ronnell Guadalajara, MD  06/29/2019 9:23 AM    [place]

## 2019-06-29 ENCOUNTER — Other Ambulatory Visit: Payer: Self-pay

## 2019-06-29 ENCOUNTER — Encounter: Payer: Self-pay | Admitting: Cardiology

## 2019-06-29 ENCOUNTER — Ambulatory Visit: Payer: Medicare HMO | Admitting: Cardiology

## 2019-06-29 VITALS — BP 130/60 | HR 66 | Temp 97.3°F | Ht 74.0 in | Wt 297.2 lb

## 2019-06-29 DIAGNOSIS — R079 Chest pain, unspecified: Secondary | ICD-10-CM

## 2019-06-29 DIAGNOSIS — Z7189 Other specified counseling: Secondary | ICD-10-CM

## 2019-06-29 DIAGNOSIS — N184 Chronic kidney disease, stage 4 (severe): Secondary | ICD-10-CM | POA: Diagnosis not present

## 2019-06-29 NOTE — Patient Instructions (Addendum)
Medication Instructions:  No changes *If you need a refill on your cardiac medications before your next appointment, please call your pharmacy*  Lab Work: None ordered this visit  Testing/Procedures: Your physician has requested that you have an exercise tolerance test. For further information please visit [email or web address]. Please also follow instruction sheet, as given.   You will need a COVID screening 3 days before your exercise tolerance test.This is a Drive Up Visit at the [facility] [address], [place]. Someone will direct you to the appropriate testing line. Stay in your car and someone will be with you shortly  Follow-Up: At [facility], you and your health needs are our priority.  As part of our continuing mission to provide you with exceptional heart care, we have created designated Provider Care Teams.  These Care Teams include your primary Cardiologist (physician) and Advanced Practice Providers (APPs -  Physician Assistants and Nurse Practitioners) who all work together to provide you with the care you need, when you need it.  We recommend signing up for the patient portal called "MyChart".  Sign up information is provided on this After Visit Summary.  MyChart is used to connect with patients for Virtual Visits (Telemedicine).  Patients are able to view lab/test results, encounter notes, upcoming appointments, etc.  Non-urgent messages can be sent to your provider as well.   To learn more about what you can do with MyChart, go to [email or web address].    Your next appointment:   3 month(s)  The format for your next appointment:   In Person  Provider:   You may see one of the following Advanced Practice Providers on your designated Care Team:    Rosaria Ferries, PA-C  Jory Sims, DNP, ANP  Cadence Kathlen Mody, NP  Almyra Deforest, PA-C  Coletta Memos, NP

## 2019-07-18 ENCOUNTER — Ambulatory Visit: Payer: Medicare HMO | Attending: Internal Medicine

## 2019-07-18 DIAGNOSIS — Z23 Encounter for immunization: Secondary | ICD-10-CM

## 2019-07-18 NOTE — Progress Notes (Signed)
   Covid-19 Vaccination Clinic  Name:  Craig Flynn    MRN: [id number] DOB: April 29, 1960  07/18/2019  [name] was observed post Covid-19 immunization for 15 minutes without incident. He was provided with Vaccine Information Sheet and instruction to access the V-Safe system.   [name] was instructed to call 911 with any severe reactions post vaccine: Marland Kitchen Difficulty breathing  . Swelling of face and throat  . A fast heartbeat  . A bad rash all over body  . Dizziness and weakness   Immunizations Administered    Name Date Dose VIS Date Route   Pfizer COVID-19 Vaccine 07/18/2019 10:47 AM 0.3 mL 05/17/2018 Intramuscular   Manufacturer: [place]   Lot: [id number]   [place]: [id number]

## 2019-07-21 ENCOUNTER — Telehealth (HOSPITAL_COMMUNITY): Payer: Self-pay

## 2019-07-21 NOTE — Telephone Encounter (Signed)
Encounter complete. 

## 2019-07-22 ENCOUNTER — Other Ambulatory Visit (HOSPITAL_COMMUNITY)
Admission: RE | Admit: 2019-07-22 | Discharge: 2019-07-22 | Disposition: A | Discharge status: Home / Self Care | Payer: Medicare HMO | Source: Ambulatory Visit | Attending: General Practice | Admitting: General Practice

## 2019-07-22 DIAGNOSIS — Z01812 Encounter for preprocedural laboratory examination: Secondary | ICD-10-CM | POA: Diagnosis not present

## 2019-07-22 DIAGNOSIS — Z20822 Contact with and (suspected) exposure to covid-19: Secondary | ICD-10-CM | POA: Diagnosis not present

## 2019-07-22 LAB — SARS CORONAVIRUS 2 (TAT 6-24 HRS): SARS Coronavirus 2: NEGATIVE

## 2019-07-26 ENCOUNTER — Other Ambulatory Visit: Payer: Self-pay

## 2019-07-26 ENCOUNTER — Ambulatory Visit (HOSPITAL_COMMUNITY)
Admission: RE | Admit: 2019-07-26 | Discharge: 2019-07-26 | Disposition: A | Discharge status: Home / Self Care | Payer: Medicare HMO | Source: Ambulatory Visit | Attending: Cardiovascular Disease | Admitting: Cardiovascular Disease

## 2019-07-26 DIAGNOSIS — R079 Chest pain, unspecified: Secondary | ICD-10-CM | POA: Insufficient documentation

## 2019-07-26 LAB — EXERCISE TOLERANCE TEST
Estimated workload: 4.6 METS
Exercise duration (min): 2 min
Exercise duration (sec): 47 s
MPHR: 162 {beats}/min
Peak HR: 114 {beats}/min
Percent HR: 70 %
RPE: 19
Rest HR: 68 {beats}/min

## 2019-07-31 DIAGNOSIS — E611 Iron deficiency: Secondary | ICD-10-CM | POA: Diagnosis not present

## 2019-07-31 DIAGNOSIS — N2581 Secondary hyperparathyroidism of renal origin: Secondary | ICD-10-CM | POA: Diagnosis not present

## 2019-07-31 DIAGNOSIS — N185 Chronic kidney disease, stage 5: Secondary | ICD-10-CM | POA: Diagnosis not present

## 2019-07-31 DIAGNOSIS — N189 Chronic kidney disease, unspecified: Secondary | ICD-10-CM | POA: Diagnosis not present

## 2019-07-31 DIAGNOSIS — C73 Malignant neoplasm of thyroid gland: Secondary | ICD-10-CM | POA: Diagnosis not present

## 2019-07-31 DIAGNOSIS — E876 Hypokalemia: Secondary | ICD-10-CM | POA: Diagnosis not present

## 2019-07-31 DIAGNOSIS — D631 Anemia in chronic kidney disease: Secondary | ICD-10-CM | POA: Diagnosis not present

## 2019-07-31 DIAGNOSIS — M109 Gout, unspecified: Secondary | ICD-10-CM | POA: Diagnosis not present

## 2019-07-31 DIAGNOSIS — E872 Acidosis: Secondary | ICD-10-CM | POA: Diagnosis not present

## 2019-07-31 DIAGNOSIS — I120 Hypertensive chronic kidney disease with stage 5 chronic kidney disease or end stage renal disease: Secondary | ICD-10-CM | POA: Diagnosis not present

## 2019-07-31 DIAGNOSIS — I770 Arteriovenous fistula, acquired: Secondary | ICD-10-CM | POA: Diagnosis not present

## 2019-08-16 ENCOUNTER — Other Ambulatory Visit: Payer: Self-pay | Admitting: Cardiology

## 2019-08-23 ENCOUNTER — Ambulatory Visit: Payer: Medicare HMO | Admitting: Internal Medicine

## 2019-09-05 ENCOUNTER — Other Ambulatory Visit: Payer: Self-pay | Admitting: *Deleted

## 2019-09-05 DIAGNOSIS — N185 Chronic kidney disease, stage 5: Secondary | ICD-10-CM

## 2019-09-07 ENCOUNTER — Ambulatory Visit (INDEPENDENT_AMBULATORY_CARE_PROVIDER_SITE_OTHER): Payer: Medicare HMO | Admitting: Internal Medicine

## 2019-09-07 ENCOUNTER — Ambulatory Visit (INDEPENDENT_AMBULATORY_CARE_PROVIDER_SITE_OTHER): Payer: Medicare HMO

## 2019-09-07 ENCOUNTER — Other Ambulatory Visit: Payer: Self-pay

## 2019-09-07 ENCOUNTER — Encounter: Payer: Self-pay | Admitting: Internal Medicine

## 2019-09-07 VITALS — BP 148/80 | HR 53 | Temp 98.0°F | Ht 74.0 in | Wt 296.0 lb

## 2019-09-07 DIAGNOSIS — R252 Cramp and spasm: Secondary | ICD-10-CM | POA: Diagnosis not present

## 2019-09-07 DIAGNOSIS — I10 Essential (primary) hypertension: Secondary | ICD-10-CM | POA: Diagnosis not present

## 2019-09-07 DIAGNOSIS — N185 Chronic kidney disease, stage 5: Secondary | ICD-10-CM | POA: Diagnosis not present

## 2019-09-07 DIAGNOSIS — J984 Other disorders of lung: Secondary | ICD-10-CM | POA: Diagnosis not present

## 2019-09-07 DIAGNOSIS — R079 Chest pain, unspecified: Secondary | ICD-10-CM

## 2019-09-07 DIAGNOSIS — I2510 Atherosclerotic heart disease of native coronary artery without angina pectoris: Secondary | ICD-10-CM | POA: Insufficient documentation

## 2019-09-07 DIAGNOSIS — I129 Hypertensive chronic kidney disease with stage 1 through stage 4 chronic kidney disease, or unspecified chronic kidney disease: Secondary | ICD-10-CM | POA: Insufficient documentation

## 2019-09-07 DIAGNOSIS — E1122 Type 2 diabetes mellitus with diabetic chronic kidney disease: Secondary | ICD-10-CM | POA: Diagnosis not present

## 2019-09-07 DIAGNOSIS — I5022 Chronic systolic (congestive) heart failure: Secondary | ICD-10-CM

## 2019-09-07 DIAGNOSIS — E669 Obesity, unspecified: Secondary | ICD-10-CM | POA: Insufficient documentation

## 2019-09-07 DIAGNOSIS — N2581 Secondary hyperparathyroidism of renal origin: Secondary | ICD-10-CM | POA: Insufficient documentation

## 2019-09-07 MED ORDER — CYCLOBENZAPRINE HCL 5 MG PO TABS: 5.0000 mg | ORAL_TABLET | Freq: Three times a day (TID) | ORAL | 1 refills | Status: DC | PRN

## 2019-09-07 NOTE — Progress Notes (Signed)
Subjective:    Patient ID: Craig Flynn, male    DOB: [birth year], 59 y.o.   MRN: [id number]  HPI  Here to f/u; overall doing ok,  Pt denies increasing sob or doe, wheezing, orthopnea, PND, increased LE swelling, or syncope, but does c/o 1 wk intermittent right mid upper CP sharp, pleuritic, tender and sore to touch, worse to move the right shoulder and arm upwards, and not associated with diaphoresis, n/v, palps or dizziness.  Pt denies new neurological symptoms such as new headache, or facial or extremity weakness or numbness.  Pt denies polydipsia, polyuria, or low sugar episode.  Pt states overall good compliance with meds, but also has c/o intermittent leg cramps, worse at night, despite no overuse.   Pt denies fever, wt loss, night sweats, loss of appetite, or other constitutional symptoms  BP at home < 140/90 Past Medical History:  Diagnosis Date  . Anemia 06/2015  . Arthritis    knee  . Cancer [facility])    thyroid cancer  . Cardiomyopathy ([place])    a. 10/2013: EF reduced to 35-40% b. 09/2014: EF improved to 55-60%, Grade 1 DD noted.  . Chest pain 06/2015  . CHF (congestive heart failure) ([place])   . CKD (chronic kidney disease), stage IV [facility])    sees  Dr Lorrene Reid  . Dyspnea    with exerion   . GERD (gastroesophageal reflux disease)   . Gout   . Hypertension   . Obesity   . Sleep apnea    not wearing c-pap now, needs new slep study per pt. (01/22/2017)  . Tuberculosis 1981   while the pt was in the service.  . Tubular adenoma of colon 10/2015  . Wears glasses    Past Surgical History:  Procedure Laterality Date  . AV FISTULA PLACEMENT Left 03/25/2016   Procedure: Creation of Left Arm ARTERIOVENOUS (AV) FISTULA;  Surgeon: Elam Dutch, MD;  Location: [facility] OR;  Service: Vascular;  Laterality: Left;  . COLONOSCOPY W/ BIOPSIES AND POLYPECTOMY     "no problem"  . HERNIA REPAIR    . LAPAROSCOPIC CHOLECYSTECTOMY    . LIGATION OF COMPETING BRANCHES OF ARTERIOVENOUS FISTULA Left  01/25/2018   Procedure: LIGATION OF COMPETING BRANCHES And Revision of ARTERIOVENOUS FISTULA LEFT ARM.;  Surgeon: Elam Dutch, MD;  Location: [place];  Service: Vascular;  Laterality: Left;  . THYROIDECTOMY  01/22/2017  . THYROIDECTOMY Left 01/22/2017   Procedure: THYROIDECTOMY;  Surgeon: Melida Quitter, MD;  Location: [place];  Service: ENT;  Laterality: Left;  . THYROIDECTOMY Right 03/26/2017   Procedure: COMPLETION OF THYROIDECTOMY;  Surgeon: Melida Quitter, MD;  Location: [place];  Service: ENT;  Laterality: Right;  . UMBILICAL HERNIA REPAIR    . WISDOM TOOTH EXTRACTION      reports that he has been smoking cigarettes. He has a 20.00 pack-year smoking history. He has never used smokeless tobacco. He reports previous alcohol use. He reports that he does not use drugs. family history includes Healthy in his maternal grandfather, paternal grandfather, and paternal grandmother; Hypertension in his maternal grandmother; Kidney disease in his maternal grandmother; Pneumonia in his father; Stroke in his mother. No Known Allergies Current Outpatient Medications on File Prior to Visit  Medication Sig Dispense Refill  . acetaminophen (TYLENOL) 500 MG tablet Take 2 tablets (1,000 mg total) by mouth every 8 (eight) hours as needed for mild pain or headache. 60 tablet 0  . albuterol (VENTOLIN HFA) 108 (90 Base) MCG/ACT inhaler  Inhale 2 puffs into the lungs every 6 (six) hours as needed for wheezing or shortness of breath. 18 g 5  . allopurinol (ZYLOPRIM) 100 MG tablet Take 150 mg by mouth daily.     Marland Kitchen aspirin EC 81 MG tablet Take 81 mg by mouth daily.    . calcitRIOL (ROCALTROL) 0.5 MCG capsule Take 0.5 mcg by mouth every Monday, Wednesday, and Friday.    . calcium elemental as carbonate (TUMS ULTRA 1000) 400 MG chewable tablet Chew 1,000 mg by mouth as needed for heartburn.     . carvedilol (COREG) 25 MG tablet TAKE 1 TABLET(25 MG) BY MOUTH TWICE DAILY (Patient taking differently: Take 25 mg by mouth 2 (two)  times daily with a meal. ) 180 tablet 2  . fenofibrate 54 MG tablet TAKE 1 TABLET BY MOUTH EVERY DAY 90 tablet 3  . furosemide (LASIX) 80 MG tablet Take 2 tablets (160 mg total) by mouth See admin instructions. Take 160 mg by mouth in the morning and 160 mg in the afternoon    . hydrALAZINE (APRESOLINE) 25 MG tablet     . isosorbide mononitrate (IMDUR) 30 MG 24 hr tablet TAKE 2 TABLETS(60 MG) BY MOUTH DAILY (Patient taking differently: Take 30 mg by mouth in the morning and at bedtime. ) 60 tablet 1  . levothyroxine (SYNTHROID) 137 MCG tablet Take 137 mcg by mouth daily before breakfast.    . nitroGLYCERIN (NITROSTAT) 0.4 MG SL tablet DISSOLVE 1 TABLET UNDER THE TONGUE EVERY 5 MINUTES AS NEEDED FOR CHEST PAIN (Patient taking differently: Place 0.4 mg under the tongue every 5 (five) minutes as needed for chest pain. ) 25 tablet 3  . pantoprazole (PROTONIX) 40 MG tablet Take 1 tablet (40 mg total) by mouth daily. (Patient taking differently: Take 40 mg by mouth 2 (two) times daily before a meal. ) 90 tablet 3  . Potassium Chloride ER 20 MEQ TBCR     . potassium chloride SA (K-DUR,KLOR-CON) 20 MEQ tablet Take 20 mEq by mouth daily.     . sodium bicarbonate 650 MG tablet Take 1 tablet (650 mg total) by mouth 3 (three) times daily. 90 tablet 5   No current facility-administered medications on file prior to visit.   Review of Systems All otherwise neg per pt     Objective:   Physical Exam BP (!) 148/80 (BP Location: Left Arm, Patient Position: Sitting, Cuff Size: Large)   Pulse (!) 53   Temp 98 F (36.7 C) (Oral)   Ht 6\' 2"  (1.88 m)   Wt 296 lb (134.3 kg)   SpO2 97%   BMI 38.00 kg/m  VS noted,  Constitutional: Pt appears in NAD HENT: Head: NCAT.  Right Ear: External ear normal.  Left Ear: External ear normal.  Eyes: . Pupils are equal, round, and reactive to light. Conjunctivae and EOM are normal Nose: without d/c or deformity Neck: Neck supple. Gross normal ROM Cardiovascular: Normal  rate and regular rhythm.   Pulmonary/Chest: Effort normal and breath sounds without rales or wheezing.  Abd:  Soft, NT, ND, + BS, no organomegaly Neurological: Pt is alert. At baseline orientation, motor grossly intact Skin: Skin is warm. No rashes, other new lesions, no LE edema Psychiatric: Pt behavior is normal without agitation  All otherwise neg per pt  Lab Results  Component Value Date   WBC 5.5 06/23/2019   HGB 10.6 (L) 06/23/2019   HCT 33.6 (L) 06/23/2019   PLT 188 06/23/2019   GLUCOSE 83  06/23/2019   CHOL 164 10/14/2018   TRIG 182.0 (H) 10/14/2018   HDL 31.10 (L) 10/14/2018   LDLCALC 96 10/14/2018   ALT 14 06/23/2019   AST 13 (L) 06/23/2019   NA 140 06/23/2019   K 3.5 06/23/2019   CL 101 06/23/2019   CREATININE 6.28 (H) 06/23/2019   BUN 45 (H) 06/23/2019   CO2 25 06/23/2019   TSH 0.016 (L) 06/22/2019   PSA 2.01 10/14/2018   HGBA1C 6.0 (H) 06/23/2019      Assessment & Plan:

## 2019-09-07 NOTE — Patient Instructions (Addendum)
Please take all new medication as prescribed - the muscle relaxer as needed  Please continue all other medications as before, and refills have been done if requested.  Please have the pharmacy call with any other refills you may need.  Please continue your efforts at being more active, low cholesterol diet, and weight control.  Please keep your appointments with your specialists as you may have planned  Please go to the XRAY Department in the first floor for the x-ray testing  You will be contacted by phone if any changes need to be made immediately.  Otherwise, you will receive a letter about your results with an explanation, but please check with MyChart first.  Please remember to sign up for MyChart if you have not done so, as this will be important to you in the future with finding out test results, communicating by private email, and scheduling acute appointments online when needed.

## 2019-09-10 ENCOUNTER — Encounter: Payer: Self-pay | Admitting: Internal Medicine

## 2019-09-10 NOTE — Assessment & Plan Note (Signed)
stable overall by history and exam, recent data reviewed with pt, and pt to continue medical treatment as before,  to f/u any worsening symptoms or concerns  

## 2019-09-10 NOTE — Assessment & Plan Note (Signed)
Exam c/w likely msk, very low suspicion for cardiac, but is pleuritic and will check cxr  I spent 31 minutes in preparing to see the patient by review of recent labs, imaging and procedures, obtaining and reviewing separately obtained history, communicating with the patient and family or caregiver, ordering medications, tests or procedures, and documenting clinical information in the EHR including the differential Dx, treatment, and any further evaluation and other management of cp, muscle cramps, chf, htn, dm

## 2019-09-10 NOTE — Assessment & Plan Note (Signed)
Mild, labs reviewed, for flexeril qhs prn,  to f/u any worsening symptoms or concerns

## 2019-09-14 ENCOUNTER — Encounter: Payer: Self-pay | Admitting: Vascular Surgery

## 2019-09-14 ENCOUNTER — Ambulatory Visit (INDEPENDENT_AMBULATORY_CARE_PROVIDER_SITE_OTHER)
Admission: RE | Admit: 2019-09-14 | Discharge: 2019-09-14 | Disposition: A | Discharge status: Home / Self Care | Payer: Medicare HMO | Source: Ambulatory Visit | Attending: Vascular Surgery | Admitting: Vascular Surgery

## 2019-09-14 ENCOUNTER — Ambulatory Visit (HOSPITAL_COMMUNITY)
Admission: RE | Admit: 2019-09-14 | Discharge: 2019-09-14 | Disposition: A | Discharge status: Home / Self Care | Payer: Medicare HMO | Source: Ambulatory Visit | Attending: Vascular Surgery | Admitting: Vascular Surgery

## 2019-09-14 ENCOUNTER — Ambulatory Visit: Payer: Medicare HMO | Admitting: Vascular Surgery

## 2019-09-14 ENCOUNTER — Other Ambulatory Visit: Payer: Self-pay

## 2019-09-14 VITALS — BP 152/70 | HR 69 | Temp 98.0°F | Resp 20 | Ht 74.0 in | Wt 298.0 lb

## 2019-09-14 DIAGNOSIS — N185 Chronic kidney disease, stage 5: Secondary | ICD-10-CM

## 2019-09-14 DIAGNOSIS — N184 Chronic kidney disease, stage 4 (severe): Secondary | ICD-10-CM | POA: Diagnosis not present

## 2019-09-14 NOTE — Progress Notes (Signed)
Patient is a 59 year old male referred for placement of long-term hemodialysis access.  He is currently CKD 5.  He previously had a left brachiocephalic AV fistula placed in 2018 and sidebranch ligation in 2019.  This fistula failed.  He has no numbness or tingling in his hands.  He is right-hand dominant.  Chronic medical problems include sleep apnea, hypertension, congestive failure all of which have been stable.  Past Medical History:  Diagnosis Date  . Anemia 06/2015  . Arthritis    knee  . Cancer [facility])    thyroid cancer  . Cardiomyopathy ([place])    a. 10/2013: EF reduced to 35-40% b. 09/2014: EF improved to 55-60%, Grade 1 DD noted.  . Chest pain 06/2015  . CHF (congestive heart failure) ([place])   . CKD (chronic kidney disease), stage IV [facility])    sees  Dr Lorrene Reid  . Dyspnea    with exerion   . GERD (gastroesophageal reflux disease)   . Gout   . Hypertension   . Obesity   . Sleep apnea    not wearing c-pap now, needs new slep study per pt. (01/22/2017)  . Tuberculosis 1981   while the pt was in the service.  . Tubular adenoma of colon 10/2015  . Wears glasses    Current Outpatient Medications on File Prior to Visit  Medication Sig Dispense Refill  . acetaminophen (TYLENOL) 500 MG tablet Take 2 tablets (1,000 mg total) by mouth every 8 (eight) hours as needed for mild pain or headache. 60 tablet 0  . albuterol (VENTOLIN HFA) 108 (90 Base) MCG/ACT inhaler Inhale 2 puffs into the lungs every 6 (six) hours as needed for wheezing or shortness of breath. 18 g 5  . allopurinol (ZYLOPRIM) 100 MG tablet Take 150 mg by mouth daily.     Marland Kitchen aspirin EC 81 MG tablet Take 81 mg by mouth daily.    . calcitRIOL (ROCALTROL) 0.5 MCG capsule Take 0.5 mcg by mouth every Monday, Wednesday, and Friday.    . calcium elemental as carbonate (TUMS ULTRA 1000) 400 MG chewable tablet Chew 1,000 mg by mouth as needed for heartburn.     . carvedilol (COREG) 25 MG tablet TAKE 1 TABLET(25 MG) BY MOUTH TWICE DAILY  (Patient taking differently: Take 25 mg by mouth 2 (two) times daily with a meal. ) 180 tablet 2  . cyclobenzaprine (FLEXERIL) 5 MG tablet Take 1 tablet (5 mg total) by mouth 3 (three) times daily as needed for muscle spasms. 30 tablet 1  . fenofibrate 54 MG tablet TAKE 1 TABLET BY MOUTH EVERY DAY 90 tablet 3  . furosemide (LASIX) 80 MG tablet Take 2 tablets (160 mg total) by mouth See admin instructions. Take 160 mg by mouth in the morning and 160 mg in the afternoon    . hydrALAZINE (APRESOLINE) 25 MG tablet     . isosorbide mononitrate (IMDUR) 30 MG 24 hr tablet TAKE 2 TABLETS(60 MG) BY MOUTH DAILY (Patient taking differently: Take 30 mg by mouth in the morning and at bedtime. ) 60 tablet 1  . levothyroxine (SYNTHROID) 137 MCG tablet Take 137 mcg by mouth daily before breakfast.    . nitroGLYCERIN (NITROSTAT) 0.4 MG SL tablet DISSOLVE 1 TABLET UNDER THE TONGUE EVERY 5 MINUTES AS NEEDED FOR CHEST PAIN (Patient taking differently: Place 0.4 mg under the tongue every 5 (five) minutes as needed for chest pain. ) 25 tablet 3  . pantoprazole (PROTONIX) 40 MG tablet Take 1 tablet (40 mg  total) by mouth daily. (Patient taking differently: Take 40 mg by mouth 2 (two) times daily before a meal. ) 90 tablet 3  . Potassium Chloride ER 20 MEQ TBCR     . potassium chloride SA (K-DUR,KLOR-CON) 20 MEQ tablet Take 20 mEq by mouth daily.     . sodium bicarbonate 650 MG tablet Take 1 tablet (650 mg total) by mouth 3 (three) times daily. 90 tablet 5   No current facility-administered medications on file prior to visit.    Review of systems: He has no shortness of breath.  He has no chest pain.  Physical exam:  Vitals:   09/14/19 1330  BP: (!) 152/70  Pulse: 69  Resp: 20  Temp: 98 F (36.7 C)  SpO2: 97%  Weight: 298 lb (135.2 kg)  Height: 6\' 2"  (1.88 m)    Extremities: 2+ brachial radial pulse bilaterally well-healed incisions left upper arm  Data: Patient had a vein mapping ultrasound today which  shows a good quality cephalic vein in the right upper arm.  There is a good quality basilic vein bilaterally.  The left arm cephalic vein has segments that are of reasonable caliber but there are skip segments where the vein appears to be occluded.  I reviewed and interpreted the studies.  I also repeated portions of the ultrasound with the SonoSite at the bedside myself.  Assessment: Patient needs long-term hemodialysis access.  I have offered him the possibility of left arm basilic vein fistula versus graft or potentially a right upper arm cephalic vein fistula.  He wishes to think about these potential options.  He will call us back in the future if he wishes to have access placed.  Plan: See above  Ruta Hinds, MD [facility] Office: [phone]

## 2019-09-28 NOTE — Progress Notes (Signed)
Cardiology Office Note   Date:  09/29/2019   ID:  NAI DASCH, DOB 10/04/1960, MRN [id number]  PCP:  Biagio Borg, MD  Cardiologist:   Minus Breeding, MD   Chief Complaint  Patient presents with  . Chest Pain     History of Present Illness: Craig Flynn is a 59 y.o. male who presents for evaluation of cardiomyopathy and hypertension. The patient has long-standing hypertension. He was seen a couple of years ago with mildly reduced ejection fraction of about 45%. In July 2016 his EF was said to be about 55%.  Last year he was in the hospital with chest pain.  He had a a perfusion defect that was fixed in the inferior wall with an EF of 47%.   There was no active ischemia.   I sent him for a POET (Plain Old Exercise Treadmill) which was negative but he had a suboptimal heart rate response.     Since I last saw him he has had none of the severe symptoms that prompted his presentation and admission.  He is limited by some knee pain.  However, he can do some walking and walks the golf course at times. The patient denies any new symptoms such as chest discomfort, neck or arm discomfort. There has been no new shortness of breath, PND or orthopnea. There have been no reported palpitations, presyncope or syncope.    Past Medical History:  Diagnosis Date  . Anemia 06/2015  . Arthritis    knee  . Cancer [facility])    thyroid cancer  . Cardiomyopathy ([place])    a. 10/2013: EF reduced to 35-40% b. 09/2014: EF improved to 55-60%, Grade 1 DD noted.  . Chest pain 06/2015  . CHF (congestive heart failure) ([place])   . CKD (chronic kidney disease), stage IV [facility])    sees  Dr Lorrene Reid  . Dyspnea    with exerion   . GERD (gastroesophageal reflux disease)   . Gout   . Hypertension   . Obesity   . Sleep apnea    not wearing c-pap now, needs new slep study per pt. (01/22/2017)  . Tuberculosis 1981   while the pt was in the service.  . Tubular adenoma of colon 10/2015  . Wears glasses     Past  Surgical History:  Procedure Laterality Date  . AV FISTULA PLACEMENT Left 03/25/2016   Procedure: Creation of Left Arm ARTERIOVENOUS (AV) FISTULA;  Surgeon: Elam Dutch, MD;  Location: [facility] OR;  Service: Vascular;  Laterality: Left;  . COLONOSCOPY W/ BIOPSIES AND POLYPECTOMY     "no problem"  . HERNIA REPAIR    . LAPAROSCOPIC CHOLECYSTECTOMY    . LIGATION OF COMPETING BRANCHES OF ARTERIOVENOUS FISTULA Left 01/25/2018   Procedure: LIGATION OF COMPETING BRANCHES And Revision of ARTERIOVENOUS FISTULA LEFT ARM.;  Surgeon: Elam Dutch, MD;  Location: [place];  Service: Vascular;  Laterality: Left;  . THYROIDECTOMY  01/22/2017  . THYROIDECTOMY Left 01/22/2017   Procedure: THYROIDECTOMY;  Surgeon: Melida Quitter, MD;  Location: [place];  Service: ENT;  Laterality: Left;  . THYROIDECTOMY Right 03/26/2017   Procedure: COMPLETION OF THYROIDECTOMY;  Surgeon: Melida Quitter, MD;  Location: [place];  Service: ENT;  Laterality: Right;  . UMBILICAL HERNIA REPAIR    . WISDOM TOOTH EXTRACTION       Current Outpatient Medications  Medication Sig Dispense Refill  . acetaminophen (TYLENOL) 500 MG tablet Take 2 tablets (1,000 mg total) by mouth  every 8 (eight) hours as needed for mild pain or headache. 60 tablet 0  . albuterol (VENTOLIN HFA) 108 (90 Base) MCG/ACT inhaler Inhale 2 puffs into the lungs every 6 (six) hours as needed for wheezing or shortness of breath. 18 g 5  . allopurinol (ZYLOPRIM) 100 MG tablet Take 150 mg by mouth daily.     Marland Kitchen aspirin EC 81 MG tablet Take 81 mg by mouth daily.    . calcitRIOL (ROCALTROL) 0.5 MCG capsule Take 0.5 mcg by mouth every Monday, Wednesday, and Friday.    . calcium elemental as carbonate (TUMS ULTRA 1000) 400 MG chewable tablet Chew 1,000 mg by mouth as needed for heartburn.     . carvedilol (COREG) 25 MG tablet TAKE 1 TABLET(25 MG) BY MOUTH TWICE DAILY (Patient taking differently: Take 25 mg by mouth 2 (two) times daily with a meal. ) 180 tablet 2  . cyclobenzaprine  (FLEXERIL) 5 MG tablet Take 1 tablet (5 mg total) by mouth 3 (three) times daily as needed for muscle spasms. 30 tablet 1  . fenofibrate 54 MG tablet TAKE 1 TABLET BY MOUTH EVERY DAY 90 tablet 3  . furosemide (LASIX) 80 MG tablet Take 2 tablets (160 mg total) by mouth See admin instructions. Take 160 mg by mouth in the morning and 160 mg in the afternoon    . hydrALAZINE (APRESOLINE) 25 MG tablet     . isosorbide mononitrate (IMDUR) 30 MG 24 hr tablet TAKE 2 TABLETS(60 MG) BY MOUTH DAILY (Patient taking differently: Take 30 mg by mouth in the morning and at bedtime. ) 60 tablet 1  . levothyroxine (SYNTHROID) 137 MCG tablet Take 137 mcg by mouth daily before breakfast.    . nitroGLYCERIN (NITROSTAT) 0.4 MG SL tablet DISSOLVE 1 TABLET UNDER THE TONGUE EVERY 5 MINUTES AS NEEDED FOR CHEST PAIN (Patient taking differently: Place 0.4 mg under the tongue every 5 (five) minutes as needed for chest pain. ) 25 tablet 3  . pantoprazole (PROTONIX) 40 MG tablet Take 1 tablet (40 mg total) by mouth daily. (Patient taking differently: Take 40 mg by mouth 2 (two) times daily before a meal. ) 90 tablet 3  . Potassium Chloride ER 20 MEQ TBCR     . potassium chloride SA (K-DUR,KLOR-CON) 20 MEQ tablet Take 20 mEq by mouth daily.     . sodium bicarbonate 650 MG tablet Take 1 tablet (650 mg total) by mouth 3 (three) times daily. 90 tablet 5   No current facility-administered medications for this visit.    Allergies:   Patient has no known allergies.   ROS:  Please see the history of present illness.   Otherwise, review of systems are positive for none.   All other systems are reviewed and negative.    PHYSICAL EXAM: VS:  BP 132/67   Pulse 80   Temp 97.7 F (36.5 C)   Ht 6\' 2"  (1.88 m)   Wt 295 lb 9.6 oz (134.1 kg)   SpO2 99%   BMI 37.95 kg/m  , BMI Body mass index is 37.95 kg/m.  GENERAL:  Well appearing NECK:  No jugular venous distention, waveform within normal limits, carotid upstroke brisk and  symmetric, no bruits, no thyromegaly LUNGS:  Clear to auscultation bilaterally CHEST:  Unremarkable HEART:  PMI not displaced or sustained,S1 and S2 within normal limits, no S3, no S4, no clicks, no rubs, no murmurs ABD:  Flat, positive bowel sounds normal in frequency in pitch, no bruits, no rebound, no  guarding, no midline pulsatile mass, no hepatomegaly, no splenomegaly EXT:  2 plus pulses throughout, no edema, no cyanosis no clubbing  EKG:  EKG is not ordered today.   Recent Labs: 06/22/2019: B Natriuretic Peptide 49.9; TSH 0.016 06/23/2019: ALT 14; BUN 45; Creatinine, Ser 6.28; Hemoglobin 10.6; Magnesium 1.4; Platelets 188; Potassium 3.5; Sodium 140    Lipid Panel    Component Value Date/Time   CHOL 164 10/14/2018 0933   TRIG 182.0 (H) 10/14/2018 0933   TRIG 98 03/03/2006 0910   HDL 31.10 (L) 10/14/2018 0933   CHOLHDL 5 10/14/2018 0933   VLDL 36.4 10/14/2018 0933   LDLCALC 96 10/14/2018 0933      Wt Readings from Last 3 Encounters:  09/29/19 295 lb 9.6 oz (134.1 kg)  09/14/19 298 lb (135.2 kg)  09/07/19 296 lb (134.3 kg)      Other studies Reviewed: Additional studies/ records that were reviewed today include: None Review of the above records demonstrates:  NA   ASSESSMENT AND PLAN:   CARDIOMYOPATHY:   EF was normal on echo in the hospital this year.  No change in therapy.   HTN:   Blood pressure is well controlled.  He will continue the meds as listed.   OBESITY: He has lost some weight and I am proud of this and encouraged more the same.  He has a goal of 250 pounds.  CKD:    Creat was down from 6.28 to 6.0.  He is followed closely by nephrology.   CHEST PAIN:    He had a negative suboptimal POET (Plain Old Exercise Treadmill).  However, he is not having any further chest pain.  No further work-up.  COVID EDUCATION: He has had his vaccines.    Current medicines are reviewed at length with the patient today.  The patient does not have concerns regarding  medicines.  The following changes have Labs/ tests ordered today include:  None  No orders of the defined types were placed in this encounter.    Disposition:   FU with APP in about 3 months. Ronnell Guadalajara, MD  09/29/2019 1:15 PM    [facility]

## 2019-09-29 ENCOUNTER — Other Ambulatory Visit: Payer: Self-pay

## 2019-09-29 ENCOUNTER — Encounter: Payer: Self-pay | Admitting: Cardiology

## 2019-09-29 ENCOUNTER — Ambulatory Visit: Payer: Medicare HMO | Admitting: Cardiology

## 2019-09-29 VITALS — BP 132/67 | HR 80 | Temp 97.7°F | Ht 74.0 in | Wt 295.6 lb

## 2019-09-29 DIAGNOSIS — N184 Chronic kidney disease, stage 4 (severe): Secondary | ICD-10-CM

## 2019-09-29 DIAGNOSIS — I10 Essential (primary) hypertension: Secondary | ICD-10-CM | POA: Diagnosis not present

## 2019-09-29 DIAGNOSIS — R072 Precordial pain: Secondary | ICD-10-CM | POA: Diagnosis not present

## 2019-09-29 NOTE — Patient Instructions (Signed)
Medication Instructions:  No changes *If you need a refill on your cardiac medications before your next appointment, please call your pharmacy*   Lab Work: None ordered If you have labs (blood work) drawn today and your tests are completely normal, you will receive your results only by: Marland Kitchen MyChart Message (if you have MyChart) OR . A paper copy in the mail If you have any lab test that is abnormal or we need to change your treatment, we will call you to review the results.   Testing/Procedures: None ordered   Follow-Up: At [facility], you and your health needs are our priority.  As part of our continuing mission to provide you with exceptional heart care, we have created designated Provider Care Teams.  These Care Teams include your primary Cardiologist (physician) and Advanced Practice Providers (APPs -  Physician Assistants and Nurse Practitioners) who all work together to provide you with the care you need, when you need it.  We recommend signing up for the patient portal called "MyChart".  Sign up information is provided on this After Visit Summary.  MyChart is used to connect with patients for Virtual Visits (Telemedicine).  Patients are able to view lab/test results, encounter notes, upcoming appointments, etc.  Non-urgent messages can be sent to your provider as well.   To learn more about what you can do with MyChart, go to [email or web address].    Your next appointment:   12 month(s)  The format for your next appointment:   In Person  Provider:   You may see Minus Breeding, MD or one of the following Advanced Practice Providers on your designated Care Team:    Rosaria Ferries, PA-C  Jory Sims, DNP, ANP  Cadence Kathlen Mody, [place]

## 2019-10-16 ENCOUNTER — Telehealth (INDEPENDENT_AMBULATORY_CARE_PROVIDER_SITE_OTHER): Payer: Medicare HMO | Admitting: Internal Medicine

## 2019-10-16 DIAGNOSIS — J309 Allergic rhinitis, unspecified: Secondary | ICD-10-CM | POA: Insufficient documentation

## 2019-10-16 DIAGNOSIS — B9689 Other specified bacterial agents as the cause of diseases classified elsewhere: Secondary | ICD-10-CM | POA: Insufficient documentation

## 2019-10-16 DIAGNOSIS — E1122 Type 2 diabetes mellitus with diabetic chronic kidney disease: Secondary | ICD-10-CM | POA: Diagnosis not present

## 2019-10-16 DIAGNOSIS — J329 Chronic sinusitis, unspecified: Secondary | ICD-10-CM | POA: Insufficient documentation

## 2019-10-16 DIAGNOSIS — N185 Chronic kidney disease, stage 5: Secondary | ICD-10-CM

## 2019-10-16 HISTORY — DX: Other specified bacterial agents as the cause of diseases classified elsewhere: B96.89

## 2019-10-16 MED ORDER — DOXYCYCLINE HYCLATE 100 MG PO TABS: 100.0000 mg | ORAL_TABLET | Freq: Two times a day (BID) | ORAL | 0 refills | Status: DC

## 2019-10-16 MED ORDER — HYDROCODONE-HOMATROPINE 5-1.5 MG/5ML PO SYRP: 5.0000 mL | ORAL_SOLUTION | Freq: Four times a day (QID) | ORAL | 0 refills | Status: AC | PRN

## 2019-10-16 MED ORDER — PREDNISONE 10 MG PO TABS: ORAL_TABLET | ORAL | 0 refills | Status: DC

## 2019-10-16 NOTE — Progress Notes (Signed)
Patient ID: Craig Flynn, male   DOB: [birth year], 59 y.o.   MRN: [id number]  Virtual Visit via Video Note  I connected with Craig Flynn on 10/16/19 at  2:20 PM EDT by a video enabled telemedicine application and verified that I am speaking with the correct person using two identifiers.  Location of all participants today Patient: at home Provider: at office   I discussed the limitations of evaluation and management by telemedicine and the availability of in person appointments. The patient expressed understanding and agreed to proceed.  History of Present Illness:  Here with 2-3 days acute onset fever, facial pain, pressure, headache, general weakness and malaise, and greenish d/c, with mild ST and cough, but pt denies chest pain, wheezing, increased sob or doe, orthopnea, PND, increased LE swelling, palpitations, dizziness or syncope.  Does have several wks ongoing nasal allergy symptoms with clearish congestion, itch and sneezing, without fever, pain, ST, cough, swelling or wheezing.   Pt denies polydipsia, polyuria Past Medical History:  Diagnosis Date  . Anemia 06/2015  . Arthritis    knee  . Cancer [facility])    thyroid cancer  . Cardiomyopathy ([place])    a. 10/2013: EF reduced to 35-40% b. 09/2014: EF improved to 55-60%, Grade 1 DD noted.  . Chest pain 06/2015  . CHF (congestive heart failure) ([place])   . CKD (chronic kidney disease), stage IV [facility])    sees  Dr Lorrene Reid  . Dyspnea    with exerion   . GERD (gastroesophageal reflux disease)   . Gout   . Hypertension   . Obesity   . Sleep apnea    not wearing c-pap now, needs new slep study per pt. (01/22/2017)  . Tuberculosis 1981   while the pt was in the service.  . Tubular adenoma of colon 10/2015  . Wears glasses    Past Surgical History:  Procedure Laterality Date  . AV FISTULA PLACEMENT Left 03/25/2016   Procedure: Creation of Left Arm ARTERIOVENOUS (AV) FISTULA;  Surgeon: Elam Dutch, MD;  Location: [facility] OR;  Service:  Vascular;  Laterality: Left;  . COLONOSCOPY W/ BIOPSIES AND POLYPECTOMY     "no problem"  . HERNIA REPAIR    . LAPAROSCOPIC CHOLECYSTECTOMY    . LIGATION OF COMPETING BRANCHES OF ARTERIOVENOUS FISTULA Left 01/25/2018   Procedure: LIGATION OF COMPETING BRANCHES And Revision of ARTERIOVENOUS FISTULA LEFT ARM.;  Surgeon: Elam Dutch, MD;  Location: [place];  Service: Vascular;  Laterality: Left;  . THYROIDECTOMY  01/22/2017  . THYROIDECTOMY Left 01/22/2017   Procedure: THYROIDECTOMY;  Surgeon: Melida Quitter, MD;  Location: [place];  Service: ENT;  Laterality: Left;  . THYROIDECTOMY Right 03/26/2017   Procedure: COMPLETION OF THYROIDECTOMY;  Surgeon: Melida Quitter, MD;  Location: [place];  Service: ENT;  Laterality: Right;  . UMBILICAL HERNIA REPAIR    . WISDOM TOOTH EXTRACTION      reports that he has been smoking cigarettes. He has a 20.00 pack-year smoking history. He has never used smokeless tobacco. He reports previous alcohol use. He reports that he does not use drugs. family history includes Healthy in his maternal grandfather, paternal grandfather, and paternal grandmother; Hypertension in his maternal grandmother; Kidney disease in his maternal grandmother; Pneumonia in his father; Stroke in his mother. No Known Allergies Current Outpatient Medications on File Prior to Visit  Medication Sig Dispense Refill  . acetaminophen (TYLENOL) 500 MG tablet Take 2 tablets (1,000 mg total) by mouth every 8 (eight)  hours as needed for mild pain or headache. 60 tablet 0  . albuterol (VENTOLIN HFA) 108 (90 Base) MCG/ACT inhaler Inhale 2 puffs into the lungs every 6 (six) hours as needed for wheezing or shortness of breath. 18 g 5  . allopurinol (ZYLOPRIM) 100 MG tablet Take 150 mg by mouth daily.     Marland Kitchen aspirin EC 81 MG tablet Take 81 mg by mouth daily.    . calcitRIOL (ROCALTROL) 0.5 MCG capsule Take 0.5 mcg by mouth every Monday, Wednesday, and Friday.    . calcium elemental as carbonate (TUMS ULTRA  1000) 400 MG chewable tablet Chew 1,000 mg by mouth as needed for heartburn.     . carvedilol (COREG) 25 MG tablet TAKE 1 TABLET(25 MG) BY MOUTH TWICE DAILY (Patient taking differently: Take 25 mg by mouth 2 (two) times daily with a meal. ) 180 tablet 2  . cyclobenzaprine (FLEXERIL) 5 MG tablet Take 1 tablet (5 mg total) by mouth 3 (three) times daily as needed for muscle spasms. 30 tablet 1  . fenofibrate 54 MG tablet TAKE 1 TABLET BY MOUTH EVERY DAY 90 tablet 3  . furosemide (LASIX) 80 MG tablet Take 2 tablets (160 mg total) by mouth See admin instructions. Take 160 mg by mouth in the morning and 160 mg in the afternoon    . hydrALAZINE (APRESOLINE) 25 MG tablet     . isosorbide mononitrate (IMDUR) 30 MG 24 hr tablet TAKE 2 TABLETS(60 MG) BY MOUTH DAILY (Patient taking differently: Take 30 mg by mouth in the morning and at bedtime. ) 60 tablet 1  . levothyroxine (SYNTHROID) 137 MCG tablet Take 137 mcg by mouth daily before breakfast.    . nitroGLYCERIN (NITROSTAT) 0.4 MG SL tablet DISSOLVE 1 TABLET UNDER THE TONGUE EVERY 5 MINUTES AS NEEDED FOR CHEST PAIN (Patient taking differently: Place 0.4 mg under the tongue every 5 (five) minutes as needed for chest pain. ) 25 tablet 3  . pantoprazole (PROTONIX) 40 MG tablet Take 1 tablet (40 mg total) by mouth daily. (Patient taking differently: Take 40 mg by mouth 2 (two) times daily before a meal. ) 90 tablet 3  . Potassium Chloride ER 20 MEQ TBCR     . potassium chloride SA (K-DUR,KLOR-CON) 20 MEQ tablet Take 20 mEq by mouth daily.     . sodium bicarbonate 650 MG tablet Take 1 tablet (650 mg total) by mouth 3 (three) times daily. 90 tablet 5   No current facility-administered medications on file prior to visit.    Observations/Objective: Alert, NAD, appropriate mood and affect, resps normal, cn 2-12 intact, moves all 4s, no visible rash or swelling Lab Results  Component Value Date   WBC 5.5 06/23/2019   HGB 10.6 (L) 06/23/2019   HCT 33.6 (L)  06/23/2019   PLT 188 06/23/2019   GLUCOSE 83 06/23/2019   CHOL 164 10/14/2018   TRIG 182.0 (H) 10/14/2018   HDL 31.10 (L) 10/14/2018   LDLCALC 96 10/14/2018   ALT 14 06/23/2019   AST 13 (L) 06/23/2019   NA 140 06/23/2019   K 3.5 06/23/2019   CL 101 06/23/2019   CREATININE 6.28 (H) 06/23/2019   BUN 45 (H) 06/23/2019   CO2 25 06/23/2019   TSH 0.016 (L) 06/22/2019   PSA 2.01 10/14/2018   HGBA1C 6.0 (H) 06/23/2019   Assessment and Plan: See notes  Follow Up Instructions: See notes   I discussed the assessment and treatment plan with the patient. The patient was provided  an opportunity to ask questions and all were answered. The patient agreed with the plan and demonstrated an understanding of the instructions.   The patient was advised to call back or seek an in-person evaluation if the symptoms worsen or if the condition fails to improve as anticipated.   Cathlean Cower, MD

## 2019-10-16 NOTE — Patient Instructions (Signed)
Please take all new medication as prescribed  Please continue all other medications as before, and refills have been done if requested.  Please have the pharmacy call with any other refills you may need.  Please continue your efforts at being more active, low cholesterol diet, and weight control.  Please keep your appointments with your specialists as you may have planned    

## 2019-10-23 ENCOUNTER — Encounter: Payer: Self-pay | Admitting: Internal Medicine

## 2019-10-23 NOTE — Assessment & Plan Note (Signed)
Mild to mod, for antibx course,  to f/u any worsening symptoms or concerns  I spent 31 minutes in preparing to see the patient by review of recent labs, imaging and procedures, obtaining and reviewing separately obtained history, communicating with the patient and family or caregiver, ordering medications, tests or procedures, and documenting clinical information in the EHR including the differential Dx, treatment, and any further evaluation and other management of sinusitis, allergies, DM

## 2019-10-23 NOTE — Assessment & Plan Note (Signed)
stable overall by history and exam, recent data reviewed with pt, and pt to continue medical treatment as before,  to f/u any worsening symptoms or concerns  

## 2019-10-23 NOTE — Assessment & Plan Note (Addendum)
[place] for [facility] and nasacort asd, also short term predpac asd,  to f/u any worsening symptoms or concerns

## 2019-10-30 DIAGNOSIS — Z8611 Personal history of tuberculosis: Secondary | ICD-10-CM | POA: Insufficient documentation

## 2019-11-05 ENCOUNTER — Emergency Department (HOSPITAL_COMMUNITY)
Admission: EM | Admit: 2019-11-05 | Discharge: 2019-11-06 | Disposition: A | Discharge status: Home / Self Care | Payer: Medicare HMO | Attending: Emergency Medicine | Admitting: Emergency Medicine

## 2019-11-05 ENCOUNTER — Other Ambulatory Visit: Payer: Self-pay

## 2019-11-05 DIAGNOSIS — R079 Chest pain, unspecified: Secondary | ICD-10-CM | POA: Diagnosis not present

## 2019-11-05 DIAGNOSIS — R200 Anesthesia of skin: Secondary | ICD-10-CM | POA: Insufficient documentation

## 2019-11-05 DIAGNOSIS — Z5321 Procedure and treatment not carried out due to patient leaving prior to being seen by health care provider: Secondary | ICD-10-CM | POA: Diagnosis not present

## 2019-11-05 DIAGNOSIS — J9811 Atelectasis: Secondary | ICD-10-CM | POA: Diagnosis not present

## 2019-11-05 NOTE — ED Triage Notes (Signed)
Pt c/o chest pain that started today, left side pain and leg numbness that has been on-going.

## 2019-11-06 ENCOUNTER — Other Ambulatory Visit: Payer: Self-pay

## 2019-11-06 ENCOUNTER — Encounter (HOSPITAL_COMMUNITY): Payer: Self-pay | Admitting: Emergency Medicine

## 2019-11-06 ENCOUNTER — Emergency Department (HOSPITAL_COMMUNITY): Payer: Medicare HMO

## 2019-11-06 DIAGNOSIS — J9811 Atelectasis: Secondary | ICD-10-CM | POA: Diagnosis not present

## 2019-11-06 DIAGNOSIS — R079 Chest pain, unspecified: Secondary | ICD-10-CM | POA: Diagnosis not present

## 2019-11-06 LAB — BASIC METABOLIC PANEL
Anion gap: 13 (ref 5–15)
BUN: 71 mg/dL — ABNORMAL HIGH (ref 6–20)
CO2: 25 mmol/L (ref 22–32)
Calcium: 9.7 mg/dL (ref 8.9–10.3)
Chloride: 102 mmol/L (ref 98–111)
Creatinine, Ser: 6.1 mg/dL — ABNORMAL HIGH (ref 0.61–1.24)
GFR calc Af Amer: 11 mL/min — ABNORMAL LOW (ref >60)
GFR calc non Af Amer: 9 mL/min — ABNORMAL LOW (ref >60)
Glucose, Bld: 103 mg/dL — ABNORMAL HIGH (ref 70–99)
Potassium: 4.2 mmol/L (ref 3.5–5.1)
Sodium: 140 mmol/L (ref 135–145)

## 2019-11-06 LAB — CBC
HCT: 35.2 % — ABNORMAL LOW (ref 39.0–52.0)
Hemoglobin: 10.8 g/dL — ABNORMAL LOW (ref 13.0–17.0)
MCH: 26.9 pg (ref 26.0–34.0)
MCHC: 30.7 g/dL (ref 30.0–36.0)
MCV: 87.8 fL (ref 80.0–100.0)
Platelets: 183 10*3/uL (ref 150–400)
RBC: 4.01 MIL/uL — ABNORMAL LOW (ref 4.22–5.81)
RDW: 13.3 % (ref 11.5–15.5)
WBC: 7.1 10*3/uL (ref 4.0–10.5)
nRBC: 0 % (ref 0.0–0.2)

## 2019-11-06 LAB — TROPONIN I (HIGH SENSITIVITY)
Troponin I (High Sensitivity): 17 ng/L (ref <18)
Troponin I (High Sensitivity): 22 ng/L — ABNORMAL HIGH (ref <18)

## 2019-11-08 DIAGNOSIS — R942 Abnormal results of pulmonary function studies: Secondary | ICD-10-CM | POA: Diagnosis not present

## 2019-11-08 DIAGNOSIS — Z01818 Encounter for other preprocedural examination: Secondary | ICD-10-CM | POA: Diagnosis not present

## 2019-11-09 ENCOUNTER — Other Ambulatory Visit: Payer: Self-pay | Admitting: Cardiology

## 2019-11-24 DIAGNOSIS — H40013 Open angle with borderline findings, low risk, bilateral: Secondary | ICD-10-CM | POA: Diagnosis not present

## 2019-11-29 ENCOUNTER — Encounter: Payer: Self-pay | Admitting: Internal Medicine

## 2019-11-29 ENCOUNTER — Ambulatory Visit (INDEPENDENT_AMBULATORY_CARE_PROVIDER_SITE_OTHER): Payer: Medicare HMO | Admitting: Internal Medicine

## 2019-11-29 ENCOUNTER — Other Ambulatory Visit: Payer: Self-pay

## 2019-11-29 VITALS — BP 130/52 | HR 72 | Temp 98.2°F | Ht 74.0 in | Wt 294.0 lb

## 2019-11-29 DIAGNOSIS — E1122 Type 2 diabetes mellitus with diabetic chronic kidney disease: Secondary | ICD-10-CM | POA: Diagnosis not present

## 2019-11-29 DIAGNOSIS — D631 Anemia in chronic kidney disease: Secondary | ICD-10-CM | POA: Diagnosis not present

## 2019-11-29 DIAGNOSIS — N185 Chronic kidney disease, stage 5: Secondary | ICD-10-CM

## 2019-11-29 DIAGNOSIS — N2581 Secondary hyperparathyroidism of renal origin: Secondary | ICD-10-CM | POA: Diagnosis not present

## 2019-11-29 DIAGNOSIS — E559 Vitamin D deficiency, unspecified: Secondary | ICD-10-CM | POA: Diagnosis not present

## 2019-11-29 DIAGNOSIS — N184 Chronic kidney disease, stage 4 (severe): Secondary | ICD-10-CM

## 2019-11-29 DIAGNOSIS — E785 Hyperlipidemia, unspecified: Secondary | ICD-10-CM | POA: Diagnosis not present

## 2019-11-29 DIAGNOSIS — I120 Hypertensive chronic kidney disease with stage 5 chronic kidney disease or end stage renal disease: Secondary | ICD-10-CM | POA: Diagnosis not present

## 2019-11-29 DIAGNOSIS — K219 Gastro-esophageal reflux disease without esophagitis: Secondary | ICD-10-CM | POA: Diagnosis not present

## 2019-11-29 DIAGNOSIS — N189 Chronic kidney disease, unspecified: Secondary | ICD-10-CM | POA: Diagnosis not present

## 2019-11-29 DIAGNOSIS — I10 Essential (primary) hypertension: Secondary | ICD-10-CM | POA: Diagnosis not present

## 2019-11-29 LAB — POCT GLYCOSYLATED HEMOGLOBIN (HGB A1C): Hemoglobin A1C: 5.3 % (ref 4.0–5.6)

## 2019-11-29 MED ORDER — PANTOPRAZOLE SODIUM 40 MG PO TBEC: 40.0000 mg | DELAYED_RELEASE_TABLET | Freq: Two times a day (BID) | ORAL | 3 refills | Status: DC

## 2019-11-29 NOTE — Progress Notes (Signed)
Subjective:    Patient ID: Craig Flynn, male    DOB: 21-Jan-1961, 59 y.o.   MRN: [id number]  HPI  Here to f/u; overall doing ok,  Pt denies chest pain, increasing sob or doe, wheezing, orthopnea, PND, increased LE swelling, palpitations, dizziness or syncope.  Pt denies new neurological symptoms such as new headache, or facial or extremity weakness or numbness.  Pt denies polydipsia, polyuria, or low sugar episode.  Pt states overall good compliance with meds, mostly trying to follow appropriate diet, with wt overall stable,  but little exercise however.  Has had mild worsening reflux, but no other abd pain, dysphagia, n/v, bowel change or blood. Past Medical History:  Diagnosis Date  . Anemia 06/2015  . Arthritis    knee  . Cancer [facility])    thyroid cancer  . Cardiomyopathy ([place])    a. 10/2013: EF reduced to 35-40% b. 09/2014: EF improved to 55-60%, Grade 1 DD noted.  . Chest pain 06/2015  . CHF (congestive heart failure) ([place])   . CKD (chronic kidney disease), stage IV [facility])    sees  Dr Lorrene Reid  . Dyspnea    with exerion   . GERD (gastroesophageal reflux disease)   . Gout   . Hypertension   . Obesity   . Sleep apnea    not wearing c-pap now, needs new slep study per pt. (01/22/2017)  . Tuberculosis 1981   while the pt was in the service.  . Tubular adenoma of colon 10/2015  . Wears glasses    Past Surgical History:  Procedure Laterality Date  . AV FISTULA PLACEMENT Left 03/25/2016   Procedure: Creation of Left Arm ARTERIOVENOUS (AV) FISTULA;  Surgeon: Elam Dutch, MD;  Location: [facility] OR;  Service: Vascular;  Laterality: Left;  . COLONOSCOPY W/ BIOPSIES AND POLYPECTOMY     "no problem"  . HERNIA REPAIR    . LAPAROSCOPIC CHOLECYSTECTOMY    . LIGATION OF COMPETING BRANCHES OF ARTERIOVENOUS FISTULA Left 01/25/2018   Procedure: LIGATION OF COMPETING BRANCHES And Revision of ARTERIOVENOUS FISTULA LEFT ARM.;  Surgeon: Elam Dutch, MD;  Location: [place];  Service: Vascular;   Laterality: Left;  . THYROIDECTOMY  01/22/2017  . THYROIDECTOMY Left 01/22/2017   Procedure: THYROIDECTOMY;  Surgeon: Melida Quitter, MD;  Location: [place];  Service: ENT;  Laterality: Left;  . THYROIDECTOMY Right 03/26/2017   Procedure: COMPLETION OF THYROIDECTOMY;  Surgeon: Melida Quitter, MD;  Location: [place];  Service: ENT;  Laterality: Right;  . UMBILICAL HERNIA REPAIR    . WISDOM TOOTH EXTRACTION      reports that he has been smoking cigarettes. He has a 20.00 pack-year smoking history. He has never used smokeless tobacco. He reports previous alcohol use. He reports that he does not use drugs. family history includes Healthy in his maternal grandfather, paternal grandfather, and paternal grandmother; Hypertension in his maternal grandmother; Kidney disease in his maternal grandmother; Pneumonia in his father; Stroke in his mother. No Known Allergies Current Outpatient Medications on File Prior to Visit  Medication Sig Dispense Refill  . acetaminophen (TYLENOL) 500 MG tablet Take 2 tablets (1,000 mg total) by mouth every 8 (eight) hours as needed for mild pain or headache. 60 tablet 0  . albuterol (VENTOLIN HFA) 108 (90 Base) MCG/ACT inhaler Inhale 2 puffs into the lungs every 6 (six) hours as needed for wheezing or shortness of breath. 18 g 5  . allopurinol (ZYLOPRIM) 100 MG tablet Take 150 mg by mouth daily.     Marland Kitchen  aspirin EC 81 MG tablet Take 81 mg by mouth daily.    . calcitRIOL (ROCALTROL) 0.5 MCG capsule Take 0.5 mcg by mouth every Monday, Wednesday, and Friday.    . calcium elemental as carbonate (TUMS ULTRA 1000) 400 MG chewable tablet Chew 1,000 mg by mouth as needed for heartburn.     . carvedilol (COREG) 25 MG tablet TAKE 1 TABLET(25 MG) BY MOUTH TWICE DAILY (Patient taking differently: Take 25 mg by mouth 2 (two) times daily with a meal. ) 180 tablet 2  . cyclobenzaprine (FLEXERIL) 5 MG tablet Take 1 tablet (5 mg total) by mouth 3 (three) times daily as needed for muscle spasms. 30  tablet 1  . doxycycline (VIBRA-TABS) 100 MG tablet Take 1 tablet (100 mg total) by mouth 2 (two) times daily. 20 tablet 0  . fenofibrate 54 MG tablet TAKE 1 TABLET BY MOUTH EVERY DAY 90 tablet 3  . furosemide (LASIX) 80 MG tablet Take 2 tablets (160 mg total) by mouth See admin instructions. Take 160 mg by mouth in the morning and 160 mg in the afternoon    . gabapentin (NEURONTIN) 100 MG capsule Take by mouth.    . hydrALAZINE (APRESOLINE) 25 MG tablet     . iron polysaccharides (FERREX 150) 150 MG capsule Take by mouth.    . isosorbide mononitrate (IMDUR) 30 MG 24 hr tablet TAKE 2 TABLETS(60 MG) BY MOUTH DAILY 60 tablet 8  . levothyroxine (SYNTHROID) 137 MCG tablet Take 137 mcg by mouth daily before breakfast.    . nitroGLYCERIN (NITROSTAT) 0.4 MG SL tablet DISSOLVE 1 TABLET UNDER THE TONGUE EVERY 5 MINUTES AS NEEDED FOR CHEST PAIN (Patient taking differently: Place 0.4 mg under the tongue every 5 (five) minutes as needed for chest pain. ) 25 tablet 3  . omeprazole (PRILOSEC) 40 MG capsule Take by mouth.    . Potassium Chloride ER 20 MEQ TBCR     . predniSONE (DELTASONE) 10 MG tablet 3 tabs by mouth per day for 3 days,2ttabs per day for 3 days,1tab per day for 3 days 18 tablet 0  . sodium bicarbonate 650 MG tablet Take 1 tablet (650 mg total) by mouth 3 (three) times daily. 90 tablet 5   No current facility-administered medications on file prior to visit.   Review of Systems All otherwise neg per pt    Objective:   Physical Exam BP (!) 130/52 (BP Location: Left Arm, Patient Position: Sitting, Cuff Size: Large)   Pulse 72   Temp 98.2 F (36.8 C) (Oral)   Ht 6\' 2"  (1.88 m)   Wt 294 lb (133.4 kg)   SpO2 98%   BMI 37.75 kg/m  VS noted,  Constitutional: Pt appears in NAD HENT: Head: NCAT.  Right Ear: External ear normal.  Left Ear: External ear normal.  Eyes: . Pupils are equal, round, and reactive to light. Conjunctivae and EOM are normal Nose: without d/c or deformity Neck: Neck  supple. Gross normal ROM Cardiovascular: Normal rate and regular rhythm.   Pulmonary/Chest: Effort normal and breath sounds without rales or wheezing.  Abd:  Soft, NT, ND, + BS, no organomegaly Neurological: Pt is alert. At baseline orientation, motor grossly intact Skin: Skin is warm. 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  0 Result Notes   1 HM Topic  Ref Range & Units 10:53 5 mo ago 1 yr ago 2 yr ago 4 yr ago 6 yr ago 7 yr ago  Hemoglobin A1C 4.0 - 5.6 % 5.3  6.0High R, CM  6.3 R, CM  6.2 R, CM  7.2High R, CM  6.0High R, CM  7.1High           Assessment & Plan:

## 2019-11-29 NOTE — Patient Instructions (Signed)
Your A1c was OK today  Please continue all other medications as before, and refills have been done if requested.  Please have the pharmacy call with any other refills you may need.  Please continue your efforts at being more active, low cholesterol diet, and weight control.  Please keep your appointments with your specialists as you may have planned  Please make an Appointment to return in 6 months, or sooner if needed  Please make an Appointment to return in 6 months, or sooner if needed

## 2019-12-02 ENCOUNTER — Encounter: Payer: Self-pay | Admitting: Internal Medicine

## 2019-12-02 NOTE — Assessment & Plan Note (Signed)
Ok to increase the protonix 40 bid

## 2019-12-02 NOTE — Assessment & Plan Note (Signed)
stable overall by history and exam, recent data reviewed with pt, and pt to continue medical treatment as before,  to f/u any worsening symptoms or concerns  

## 2019-12-02 NOTE — Assessment & Plan Note (Addendum)
stable overall by history and exam, recent data reviewed with pt, and pt to continue medical treatment as before,  to f/u any worsening symptoms or concerns  I spent 31 minutes in preparing to see the patient by review of recent labs, imaging and procedures, obtaining and reviewing separately obtained history, communicating with the patient and family or caregiver, ordering medications, tests or procedures, and documenting clinical information in the EHR including the differential Dx, treatment, and any further evaluation and other management of ckd, dm, gerd, htn, hld, vit d def

## 2019-12-02 NOTE — Assessment & Plan Note (Signed)
Cont oral replacement 

## 2019-12-28 DIAGNOSIS — H40012 Open angle with borderline findings, low risk, left eye: Secondary | ICD-10-CM | POA: Diagnosis not present

## 2020-01-12 ENCOUNTER — Other Ambulatory Visit: Payer: Self-pay | Admitting: Cardiology

## 2020-01-25 ENCOUNTER — Ambulatory Visit: Payer: Medicare HMO | Admitting: Internal Medicine

## 2020-01-30 ENCOUNTER — Ambulatory Visit: Payer: Medicare HMO | Admitting: Internal Medicine

## 2020-01-30 DIAGNOSIS — Z0289 Encounter for other administrative examinations: Secondary | ICD-10-CM

## 2020-02-01 ENCOUNTER — Ambulatory Visit (INDEPENDENT_AMBULATORY_CARE_PROVIDER_SITE_OTHER): Payer: Medicare HMO

## 2020-02-01 DIAGNOSIS — Z0000 Encounter for general adult medical examination without abnormal findings: Secondary | ICD-10-CM | POA: Diagnosis not present

## 2020-02-01 NOTE — Patient Instructions (Signed)
Craig Flynn , Thank you for taking time to come for your Medicare Wellness Visit. I appreciate your ongoing commitment to your health goals. Please review the following plan we discussed and let me know if I can assist you in the future.   Screening recommendations/referrals: Colonoscopy: 12/21/2018; due every 5 years (per patient) Recommended yearly ophthalmology/optometry visit for glaucoma screening and checkup Recommended yearly dental visit for hygiene and checkup  Vaccinations: Influenza vaccine: declined Pneumococcal vaccine: declined Tdap vaccine: declined Shingles vaccine: declined   Covid-19: up to date  Advanced directives: Please bring a copy of your health care power of attorney and living will to the office at your convenience.  Conditions/risks identified: Yes; Reviewed health maintenance screenings with patient today and relevant education, vaccines, and/or referrals were provided. Please continue to do your personal lifestyle choices by: daily care of teeth and gums, regular physical activity (goal should be 5 days a week for 30 minutes), eat a healthy diet, avoid tobacco and drug use, limiting any alcohol intake, taking a low-dose aspirin (if not allergic or have been advised by your provider otherwise) and taking vitamins and minerals as recommended by your provider. Continue doing brain stimulating activities (puzzles, reading, adult coloring books, staying active) to keep memory sharp. Continue to eat heart healthy diet (full of fruits, vegetables, whole grains, lean protein, water--limit salt, fat, and sugar intake) and increase physical activity as tolerated.  Next appointment: Please schedule your next Medicare Wellness Visit with your Nurse Health Advisor in 1 year by calling [phone].  Preventive Care 20 Years and Older, Male Preventive care refers to lifestyle choices and visits with your health care provider that can promote health and wellness. What does  preventive care include?  A yearly physical exam. This is also called an annual well check.  Dental exams once or twice a year.  Routine eye exams. Ask your health care provider how often you should have your eyes checked.  Personal lifestyle choices, including:  Daily care of your teeth and gums.  Regular physical activity.  Eating a healthy diet.  Avoiding tobacco and drug use.  Limiting alcohol use.  Practicing safe sex.  Taking low doses of aspirin every day.  Taking vitamin and mineral supplements as recommended by your health care provider. What happens during an annual well check? The services and screenings done by your health care provider during your annual well check will depend on your age, overall health, lifestyle risk factors, and family history of disease. Counseling  Your health care provider may ask you questions about your:  Alcohol use.  Tobacco use.  Drug use.  Emotional well-being.  Home and relationship well-being.  Sexual activity.  Eating habits.  History of falls.  Memory and ability to understand (cognition).  Work and work Statistician. Screening  You may have the following tests or measurements:  Height, weight, and BMI.  Blood pressure.  Lipid and cholesterol levels. These may be checked every 5 years, or more frequently if you are over 101 years old.  Skin check.  Lung cancer screening. You may have this screening every year starting at age 40 if you have a 30-pack-year history of smoking and currently smoke or have quit within the past 15 years.  Fecal occult blood test (FOBT) of the stool. You may have this test every year starting at age 16.  Flexible sigmoidoscopy or colonoscopy. You may have a sigmoidoscopy every 5 years or a colonoscopy every 10 years starting at age 78.  Prostate cancer screening. Recommendations will vary depending on your family history and other risks.  Hepatitis C blood test.  Hepatitis B  blood test.  Sexually transmitted disease (STD) testing.  Diabetes screening. This is done by checking your blood sugar (glucose) after you have not eaten for a while (fasting). You may have this done every 1-3 years.  Abdominal aortic aneurysm (AAA) screening. You may need this if you are a current or former smoker.  Osteoporosis. You may be screened starting at age 17 if you are at high risk. Talk with your health care provider about your test results, treatment options, and if necessary, the need for more tests. Vaccines  Your health care provider may recommend certain vaccines, such as:  Influenza vaccine. This is recommended every year.  Tetanus, diphtheria, and acellular pertussis (Tdap, Td) vaccine. You may need a Td booster every 10 years.  Zoster vaccine. You may need this after age 16.  Pneumococcal 13-valent conjugate (PCV13) vaccine. One dose is recommended after age 77.  Pneumococcal polysaccharide (PPSV23) vaccine. One dose is recommended after age 87. Talk to your health care provider about which screenings and vaccines you need and how often you need them. This information is not intended to replace advice given to you by your health care provider. Make sure you discuss any questions you have with your health care provider. Document Released: 04/05/2015 Document Revised: 11/27/2015 Document Reviewed: 01/08/2015 Elsevier Interactive Patient Education  2017 [place] Prevention in the Home Falls can cause injuries. They can happen to people of all ages. There are many things you can do to make your home safe and to help prevent falls. What can I do on the outside of my home?  Regularly fix the edges of walkways and driveways and fix any cracks.  Remove anything that might make you trip as you walk through a door, such as a raised step or threshold.  Trim any bushes or trees on the path to your home.  Use bright outdoor lighting.  Clear any walking paths  of anything that might make someone trip, such as rocks or tools.  Regularly check to see if handrails are loose or broken. Make sure that both sides of any steps have handrails.  Any raised decks and porches should have guardrails on the edges.  Have any leaves, snow, or ice cleared regularly.  Use sand or salt on walking paths during winter.  Clean up any spills in your garage right away. This includes oil or grease spills. What can I do in the bathroom?  Use night lights.  Install grab bars by the toilet and in the tub and shower. Do not use towel bars as grab bars.  Use non-skid mats or decals in the tub or shower.  If you need to sit down in the shower, use a plastic, non-slip stool.  Keep the floor dry. Clean up any water that spills on the floor as soon as it happens.  Remove soap buildup in the tub or shower regularly.  Attach bath mats securely with double-sided non-slip rug tape.  Do not have throw rugs and other things on the floor that can make you trip. What can I do in the bedroom?  Use night lights.  Make sure that you have a light by your bed that is easy to reach.  Do not use any sheets or blankets that are too big for your bed. They should not hang down onto the floor.  Have  a firm chair that has side arms. You can use this for support while you get dressed.  Do not have throw rugs and other things on the floor that can make you trip. What can I do in the kitchen?  Clean up any spills right away.  Avoid walking on wet floors.  Keep items that you use a lot in easy-to-reach places.  If you need to reach something above you, use a strong step stool that has a grab bar.  Keep electrical cords out of the way.  Do not use floor polish or wax that makes floors slippery. If you must use wax, use non-skid floor wax.  Do not have throw rugs and other things on the floor that can make you trip. What can I do with my stairs?  Do not leave any items on  the stairs.  Make sure that there are handrails on both sides of the stairs and use them. Fix handrails that are broken or loose. Make sure that handrails are as long as the stairways.  Check any carpeting to make sure that it is firmly attached to the stairs. Fix any carpet that is loose or worn.  Avoid having throw rugs at the top or bottom of the stairs. If you do have throw rugs, attach them to the floor with carpet tape.  Make sure that you have a light switch at the top of the stairs and the bottom of the stairs. If you do not have them, ask someone to add them for you. What else can I do to help prevent falls?  Wear shoes that:  Do not have high heels.  Have rubber bottoms.  Are comfortable and fit you well.  Are closed at the toe. Do not wear sandals.  If you use a stepladder:  Make sure that it is fully opened. Do not climb a closed stepladder.  Make sure that both sides of the stepladder are locked into place.  Ask someone to hold it for you, if possible.  Clearly mark and make sure that you can see:  Any grab bars or handrails.  First and last steps.  Where the edge of each step is.  Use tools that help you move around (mobility aids) if they are needed. These include:  Canes.  Walkers.  Scooters.  Crutches.  Turn on the lights when you go into a dark area. Replace any light bulbs as soon as they burn out.  Set up your furniture so you have a clear path. Avoid moving your furniture around.  If any of your floors are uneven, fix them.  If there are any pets around you, be aware of where they are.  Review your medicines with your doctor. Some medicines can make you feel dizzy. This can increase your chance of falling. Ask your doctor what other things that you can do to help prevent falls. This information is not intended to replace advice given to you by your health care provider. Make sure you discuss any questions you have with your health care  provider. Document Released: 01/03/2009 Document Revised: 08/15/2015 Document Reviewed: 04/13/2014 Elsevier Interactive Patient Education  2017 [employer].

## 2020-02-01 NOTE — Progress Notes (Addendum)
I connected with Craig Flynn today by telephone and verified that I am speaking with the correct person using two identifiers. Location patient: home Location provider: work Persons participating in the virtual visit: Daxtyn Rottenberg and Lisette Abu, LPN.   I discussed the limitations, risks, security and privacy concerns of performing an evaluation and management service by telephone and the availability of in person appointments. I also discussed with the patient that there may be a patient responsible charge related to this service. The patient expressed understanding and verbally consented to this telephonic visit.    Interactive audio and video telecommunications were attempted between this provider and patient, however failed, due to patient having technical difficulties OR patient did not have access to video capability.  We continued and completed visit with audio only.  Some vital signs may be absent or patient reported.   Time Spent with patient on telephone encounter: 30 minutes  Subjective:   LEANORD Flynn is a 59 y.o. male who presents for Medicare Annual/Subsequent preventive examination.  Review of Systems    No ROS. Medicare Wellness Visit. Cardiac Risk Factors include: advanced age (>49men, >84 women);diabetes mellitus;dyslipidemia;family history of premature cardiovascular disease;hypertension;male gender;smoking/ tobacco exposure Sleep Patterns: No sleep issues, feels rested on waking and sleeps 8 hours nightly. Home Safety/Smoke Alarms: Feels safe in home; uses home alarm. Smoke alarms in place. Living environment: 1-story home; Lives alone; no needs for DME; good support system. Seat Belt Safety/Bike Helmet: Wears seat belt.    Objective:    There were no vitals filed for this visit. There is no height or weight on file to calculate BMI.  Advanced Directives 02/01/2020 09/14/2019 06/23/2019 10/28/2018 10/04/2018 05/04/2018 03/26/2017  Does Patient Have a Medical Advance  Directive? Yes No No No No No No  Type of Advance Directive - - - - - - -  Does patient want to make changes to medical advance directive? No - Patient declined - - - - - -  Would patient like information on creating a medical advance directive? - No - Patient declined No - Patient declined No - Patient declined No - Patient declined No - Patient declined Yes (MAU/Ambulatory/Procedural Areas - Information given)    Current Medications (verified) Outpatient Encounter Medications as of 02/01/2020  Medication Sig   acetaminophen (TYLENOL) 500 MG tablet Take 2 tablets (1,000 mg total) by mouth every 8 (eight) hours as needed for mild pain or headache.   albuterol (VENTOLIN HFA) 108 (90 Base) MCG/ACT inhaler Inhale 2 puffs into the lungs every 6 (six) hours as needed for wheezing or shortness of breath.   allopurinol (ZYLOPRIM) 100 MG tablet Take 150 mg by mouth daily.    aspirin EC 81 MG tablet Take 81 mg by mouth daily.   calcitRIOL (ROCALTROL) 0.5 MCG capsule Take 0.5 mcg by mouth every Monday, Wednesday, and Friday.   calcium elemental as carbonate (TUMS ULTRA 1000) 400 MG chewable tablet Chew 1,000 mg by mouth as needed for heartburn.    carvedilol (COREG) 25 MG tablet TAKE 1 TABLET(25 MG) BY MOUTH TWICE DAILY (Patient taking differently: Take 25 mg by mouth 2 (two) times daily with a meal. )   cyclobenzaprine (FLEXERIL) 5 MG tablet Take 1 tablet (5 mg total) by mouth 3 (three) times daily as needed for muscle spasms.   doxycycline (VIBRA-TABS) 100 MG tablet Take 1 tablet (100 mg total) by mouth 2 (two) times daily.   fenofibrate 54 MG tablet TAKE 1 TABLET BY MOUTH EVERY  DAY   furosemide (LASIX) 80 MG tablet Take 2 tablets (160 mg total) by mouth See admin instructions. Take 160 mg by mouth in the morning and 160 mg in the afternoon   gabapentin (NEURONTIN) 100 MG capsule Take by mouth.   hydrALAZINE (APRESOLINE) 25 MG tablet    iron polysaccharides (FERREX 150) 150 MG capsule Take by mouth.    isosorbide mononitrate (IMDUR) 30 MG 24 hr tablet TAKE 2 TABLETS(60 MG) BY MOUTH DAILY   levothyroxine (SYNTHROID) 137 MCG tablet Take 137 mcg by mouth daily before breakfast.   nitroGLYCERIN (NITROSTAT) 0.4 MG SL tablet DISSOLVE 1 TABLET UNDER THE TONGUE EVERY 5 MINUTES AS NEEDED FOR CHEST PAIN (Patient taking differently: Place 0.4 mg under the tongue every 5 (five) minutes as needed for chest pain. )   omeprazole (PRILOSEC) 40 MG capsule Take by mouth.   pantoprazole (PROTONIX) 40 MG tablet Take 1 tablet (40 mg total) by mouth 2 (two) times daily before a meal.   Potassium Chloride ER 20 MEQ TBCR    predniSONE (DELTASONE) 10 MG tablet 3 tabs by mouth per day for 3 days,2ttabs per day for 3 days,1tab per day for 3 days   sodium bicarbonate 650 MG tablet Take 1 tablet (650 mg total) by mouth 3 (three) times daily.   No facility-administered encounter medications on file as of 02/01/2020.    Allergies (verified) Patient has no known allergies.   History: Past Medical History:  Diagnosis Date   Anemia 06/2015   Arthritis    knee   Cancer ([place])    thyroid cancer   Cardiomyopathy ([place])    a. 10/2013: EF reduced to 35-40% b. 09/2014: EF improved to 55-60%, Grade 1 DD noted.   Chest pain 06/2015   CHF (congestive heart failure) (HCC)    CKD (chronic kidney disease), stage IV [facility])    sees  Dr Lorrene Reid   Dyspnea    with exerion    GERD (gastroesophageal reflux disease)    Gout    Hypertension    Obesity    Sleep apnea    not wearing c-pap now, needs new slep study per pt. (01/22/2017)   Tuberculosis 1981   while the pt was in the service.   Tubular adenoma of colon 10/2015   Wears glasses    Past Surgical History:  Procedure Laterality Date   AV FISTULA PLACEMENT Left 03/25/2016   Procedure: Creation of Left Arm ARTERIOVENOUS (AV) FISTULA;  Surgeon: Elam Dutch, MD;  Location: [facility] OR;  Service: Vascular;  Laterality: Left;   COLONOSCOPY W/ BIOPSIES AND POLYPECTOMY     "no  problem"   HERNIA REPAIR     LAPAROSCOPIC CHOLECYSTECTOMY     LIGATION OF COMPETING BRANCHES OF ARTERIOVENOUS FISTULA Left 01/25/2018   Procedure: LIGATION OF COMPETING BRANCHES And Revision of ARTERIOVENOUS FISTULA LEFT ARM.;  Surgeon: Elam Dutch, MD;  Location: [facility];  Service: Vascular;  Laterality: Left;   THYROIDECTOMY  01/22/2017   THYROIDECTOMY Left 01/22/2017   Procedure: THYROIDECTOMY;  Surgeon: Melida Quitter, MD;  Location: [facility];  Service: ENT;  Laterality: Left;   THYROIDECTOMY Right 03/26/2017   Procedure: COMPLETION OF THYROIDECTOMY;  Surgeon: Melida Quitter, MD;  Location: [facility] OR;  Service: ENT;  Laterality: Right;   UMBILICAL HERNIA REPAIR     WISDOM TOOTH EXTRACTION     Family History  Problem Relation Age of Onset   Stroke Mother    Pneumonia Father  died of Pneumonia x3   Kidney disease Maternal Grandmother    Hypertension Maternal Grandmother    Healthy Maternal Grandfather    Healthy Paternal Grandmother    Healthy Paternal Grandfather    CAD Neg Hx    Colon cancer Neg Hx    Esophageal cancer Neg Hx    Pancreatic cancer Neg Hx    Prostate cancer Neg Hx    Stomach cancer Neg Hx    Rectal cancer Neg Hx    Social History   Socioeconomic History   Marital status: Divorced    Spouse name: Not on file   Number of children: 2   Years of education: 12   Highest education level: Not on file  Occupational History   Occupation: Disability    Comment: Knees   Tobacco Use   Smoking status: Current Every Day Smoker    Packs/day: 0.50    Years: 40.00    Pack years: 20.00    Types: Cigarettes   Smokeless tobacco: Never Used  Scientific laboratory technician Use: Never used  Substance and Sexual Activity   Alcohol use: Not Currently    Alcohol/week: 0.0 standard drinks   Drug use: No   Sexual activity: Yes  Other Topics Concern   Not on file  Social History Narrative   Fun: Golf, basketball    Denies religious beliefs effecting health care.    Social  Determinants of Health   Financial Resource Strain: Low Risk    Difficulty of Paying Living Expenses: Not hard at all  Food Insecurity: No Food Insecurity   Worried About Charity fundraiser in the Last Year: Never true   [place] in the Last Year: Never true  Transportation Needs: No Transportation Needs   Lack of Transportation (Medical): No   Lack of Transportation (Non-Medical): No  Physical Activity: Sufficiently Active   Days of Exercise per Week: 5 days   Minutes of Exercise per Session: 30 min  Stress: No Stress Concern Present   Feeling of Stress : Not at all  Social Connections: Moderately Integrated   Frequency of Communication with Friends and Family: More than three times a week   Frequency of Social Gatherings with Friends and Family: More than three times a week   Attends Religious Services: More than 4 times per year   Active Member of [employer] or Organizations: Yes   Attends Music therapist: More than 4 times per year   Marital Status: Never married    Tobacco Counseling Ready to quit: Not Answered Counseling given: Not Answered   Clinical Intake:  Pre-visit preparation completed: Yes  Pain : No/denies pain     Nutritional Risks: None Diabetes: Yes CBG done?: No Did pt. bring in CBG monitor from home?: No  How often do you need to have someone help you when you read instructions, pamphlets, or other written materials from your doctor or pharmacy?: 1 - Never What is the last grade level you completed in school?: HSG  Diabetic? yes  Interpreter Needed?: No  Information entered by :: Lisette Abu, LPN   Activities of Daily Living In your present state of health, do you have any difficulty performing the following activities: 02/01/2020 06/23/2019  Hearing? N N  Vision? N N  Difficulty concentrating or making decisions? N N  Walking or climbing stairs? N N  Dressing or bathing? N N  Doing errands, shopping? N N  Preparing  Food and eating ? N -  Using the Toilet? N -  In the past six months, have you accidently leaked urine? N -  Do you have problems with loss of bowel control? N -  Managing your Medications? N -  Managing your Finances? N -  Housekeeping or managing your Housekeeping? N -  Some recent data might be hidden    Patient Care Team: Biagio Borg, MD as PCP - General (Internal Medicine) Minus Breeding, MD as PCP - Cardiology (Cardiology) Jamal Maes, MD as Consulting Physician (Nephrology)  Indicate any recent Medical Services you may have received from other than [facility] providers in the past year (date may be approximate).     Assessment:   This is a routine wellness examination for [employer].  Hearing/Vision screen No exam data present  Dietary issues and exercise activities discussed: Current Exercise Habits: Home exercise routine;Structured exercise class, Type of exercise: walking;Other - see comments;strength training/weights;stretching;treadmill (golfing), Time (Minutes): 30, Frequency (Times/Week): 5, Weekly Exercise (Minutes/Week): 150, Intensity: Moderate, Exercise limited by: None identified  Goals      Patient Stated     My goal for this year is to lose 20-25 pounds.       Depression Screen PHQ 2/9 Scores 02/01/2020 09/07/2019 05/26/2019 10/14/2018 10/14/2018 05/14/2017  PHQ - 2 Score 0 0 0 0 0 0  PHQ- 9 Score - - - - 0 -    Fall Risk Fall Risk  02/01/2020 09/07/2019 05/26/2019 10/14/2018 05/14/2017  Falls in the past year? 0 0 0 0 No  Number falls in past yr: 0 0 - - -  Injury with Fall? 0 0 - - -  Risk for fall due to : No Fall Risks No Fall Risks - - -  Follow up Falls evaluation completed Falls evaluation completed - - -    Any stairs in or around the home? No  If so, are there any without handrails? No  Home free of loose throw rugs in walkways, pet beds, electrical cords, etc? Yes  Adequate lighting in your home to reduce risk of falls? Yes   ASSISTIVE DEVICES  UTILIZED TO PREVENT FALLS:  Life alert? No  Use of a cane, walker or w/c? No  Grab bars in the bathroom? No  Shower chair or bench in shower? No  Elevated toilet seat or a handicapped toilet? No   TIMED UP AND GO:  Was the test performed? No .  Length of time to ambulate 10 feet: 0 sec.   Gait steady and fast without use of assistive device  Cognitive Function: Patient is cogitatively intact.        Immunizations Immunization History  Administered Date(s) Administered   Influenza Whole 01/22/2008   PFIZER SARS-COV-2 Vaccination 06/24/2019, 07/18/2019   Pneumococcal Polysaccharide-23 01/22/2008    TDAP status: Due, Education has been provided regarding the importance of this vaccine. Advised may receive this vaccine at local pharmacy or Health Dept. Aware to provide a copy of the vaccination record if obtained from local pharmacy or Health Dept. Verbalized acceptance and understanding. Flu Vaccine status: Declined, Education has been provided regarding the importance of this vaccine but patient still declined. Advised may receive this vaccine at local pharmacy or Health Dept. Aware to provide a copy of the vaccination record if obtained from local pharmacy or Health Dept. Verbalized acceptance and understanding. Pneumococcal vaccine status: Declined,  Education has been provided regarding the importance of this vaccine but patient still declined. Advised may receive this vaccine at local pharmacy or Health Dept.  Aware to provide a copy of the vaccination record if obtained from local pharmacy or Health Dept. Verbalized acceptance and understanding.  Covid-19 vaccine status: Completed vaccines  Qualifies for Shingles Vaccine? Yes   Zostavax completed No   Shingrix Completed?: No.    Education has been provided regarding the importance of this vaccine. Patient has been advised to call insurance company to determine out of pocket expense if they have not yet received this vaccine.  Advised may also receive vaccine at local pharmacy or Health Dept. Verbalized acceptance and understanding.  Screening Tests Health Maintenance  Topic Date Due   INFLUENZA VACCINE  06/20/2020 (Originally 10/22/2019)   TETANUS/TDAP  11/28/2020 (Originally 09/21/2019)   HEMOGLOBIN A1C  05/28/2020   OPHTHALMOLOGY EXAM  11/26/2020   FOOT EXAM  11/28/2020   COLONOSCOPY  12/20/2025   PNEUMOCOCCAL POLYSACCHARIDE VACCINE AGE 32-64 HIGH RISK  Completed   COVID-19 Vaccine  Completed   Hepatitis C Screening  Completed   HIV Screening  Completed    Health Maintenance  There are no preventive care reminders to display for this patient.  Colorectal cancer screening: Completed 12/21/2018. Repeat every 5 years  Lung Cancer Screening: (Low Dose CT Chest recommended if Age 24-80 years, 30 pack-year currently smoking OR have quit w/in 15years.) does qualify.   Lung Cancer Screening Referral: no  Additional Screening:  Hepatitis C Screening: does qualify; Completed: yes  Vision Screening: Recommended annual ophthalmology exams for early detection of glaucoma and other disorders of the eye. Is the patient up to date with their annual eye exam?  Yes  Who is the provider or what is the name of the office in which the patient attends annual eye exams? [facility] If pt is not established with a provider, would they like to be referred to a provider to establish care? No .   Dental Screening: Recommended annual dental exams for proper oral hygiene  Community Resource Referral / Chronic Care Management: CRR required this visit?  No   CCM required this visit?  No      Plan:     I have personally reviewed and noted the following in the patient's chart:   Medical and social history Use of alcohol, tobacco or illicit drugs  Current medications and supplements Functional ability and status Nutritional status Physical activity Advanced directives List of other physicians Hospitalizations,  surgeries, and ER visits in previous 12 months Vitals Screenings to include cognitive, depression, and falls Referrals and appointments  In addition, I have reviewed and discussed with patient certain preventive protocols, quality metrics, and best practice recommendations. A written personalized care plan for preventive services as well as general preventive health recommendations were provided to patient.     Sheral Flow, LPN   19/14/7829   Nurse Notes:  Patient is cogitatively intact. There were no vitals filed for this visit. There is no height or weight on file to calculate BMI. Patient stated that he has no issues with gait or balance; does not use any assistive devices.  Medical screening examination/treatment/procedure(s) were performed by non-physician practitioner and as supervising physician I was immediately available for consultation/collaboration.  I agree with above. Lew Dawes, MD

## 2020-02-03 ENCOUNTER — Other Ambulatory Visit: Payer: Self-pay

## 2020-02-03 ENCOUNTER — Emergency Department (HOSPITAL_COMMUNITY): Payer: Medicare HMO

## 2020-02-03 ENCOUNTER — Emergency Department (HOSPITAL_COMMUNITY)
Admission: EM | Admit: 2020-02-03 | Discharge: 2020-02-03 | Disposition: A | Discharge status: Home / Self Care | Payer: Medicare HMO | Attending: Emergency Medicine | Admitting: Emergency Medicine

## 2020-02-03 DIAGNOSIS — N185 Chronic kidney disease, stage 5: Secondary | ICD-10-CM | POA: Diagnosis not present

## 2020-02-03 DIAGNOSIS — M25512 Pain in left shoulder: Secondary | ICD-10-CM | POA: Diagnosis not present

## 2020-02-03 DIAGNOSIS — F17210 Nicotine dependence, cigarettes, uncomplicated: Secondary | ICD-10-CM | POA: Diagnosis not present

## 2020-02-03 DIAGNOSIS — M62838 Other muscle spasm: Secondary | ICD-10-CM

## 2020-02-03 DIAGNOSIS — Z7982 Long term (current) use of aspirin: Secondary | ICD-10-CM | POA: Diagnosis not present

## 2020-02-03 DIAGNOSIS — R079 Chest pain, unspecified: Secondary | ICD-10-CM | POA: Diagnosis not present

## 2020-02-03 DIAGNOSIS — I2510 Atherosclerotic heart disease of native coronary artery without angina pectoris: Secondary | ICD-10-CM | POA: Diagnosis not present

## 2020-02-03 DIAGNOSIS — Z79899 Other long term (current) drug therapy: Secondary | ICD-10-CM | POA: Diagnosis not present

## 2020-02-03 DIAGNOSIS — I132 Hypertensive heart and chronic kidney disease with heart failure and with stage 5 chronic kidney disease, or end stage renal disease: Secondary | ICD-10-CM | POA: Diagnosis not present

## 2020-02-03 DIAGNOSIS — I5021 Acute systolic (congestive) heart failure: Secondary | ICD-10-CM | POA: Insufficient documentation

## 2020-02-03 DIAGNOSIS — N189 Chronic kidney disease, unspecified: Secondary | ICD-10-CM

## 2020-02-03 DIAGNOSIS — I10 Essential (primary) hypertension: Secondary | ICD-10-CM | POA: Diagnosis not present

## 2020-02-03 DIAGNOSIS — E119 Type 2 diabetes mellitus without complications: Secondary | ICD-10-CM | POA: Diagnosis not present

## 2020-02-03 DIAGNOSIS — I129 Hypertensive chronic kidney disease with stage 1 through stage 4 chronic kidney disease, or unspecified chronic kidney disease: Secondary | ICD-10-CM | POA: Diagnosis not present

## 2020-02-03 DIAGNOSIS — Z85850 Personal history of malignant neoplasm of thyroid: Secondary | ICD-10-CM | POA: Insufficient documentation

## 2020-02-03 LAB — BASIC METABOLIC PANEL
Anion gap: 13 (ref 5–15)
BUN: 58 mg/dL — ABNORMAL HIGH (ref 6–20)
CO2: 27 mmol/L (ref 22–32)
Calcium: 10.5 mg/dL — ABNORMAL HIGH (ref 8.9–10.3)
Chloride: 97 mmol/L — ABNORMAL LOW (ref 98–111)
Creatinine, Ser: 9.48 mg/dL — ABNORMAL HIGH (ref 0.61–1.24)
GFR, Estimated: 6 mL/min — ABNORMAL LOW (ref >60)
Glucose, Bld: 104 mg/dL — ABNORMAL HIGH (ref 70–99)
Potassium: 4.5 mmol/L (ref 3.5–5.1)
Sodium: 137 mmol/L (ref 135–145)

## 2020-02-03 LAB — CBC WITH DIFFERENTIAL/PLATELET
Abs Immature Granulocytes: 0.01 10*3/uL (ref 0.00–0.07)
Basophils Absolute: 0 10*3/uL (ref 0.0–0.1)
Basophils Relative: 0 %
Eosinophils Absolute: 0.2 10*3/uL (ref 0.0–0.5)
Eosinophils Relative: 3 %
HCT: 36 % — ABNORMAL LOW (ref 39.0–52.0)
Hemoglobin: 11 g/dL — ABNORMAL LOW (ref 13.0–17.0)
Immature Granulocytes: 0 %
Lymphocytes Relative: 28 %
Lymphs Abs: 1.7 10*3/uL (ref 0.7–4.0)
MCH: 27.4 pg (ref 26.0–34.0)
MCHC: 30.6 g/dL (ref 30.0–36.0)
MCV: 89.6 fL (ref 80.0–100.0)
Monocytes Absolute: 0.4 10*3/uL (ref 0.1–1.0)
Monocytes Relative: 7 %
Neutro Abs: 3.6 10*3/uL (ref 1.7–7.7)
Neutrophils Relative %: 62 %
Platelets: 195 10*3/uL (ref 150–400)
RBC: 4.02 MIL/uL — ABNORMAL LOW (ref 4.22–5.81)
RDW: 13.5 % (ref 11.5–15.5)
WBC: 6 10*3/uL (ref 4.0–10.5)
nRBC: 0 % (ref 0.0–0.2)

## 2020-02-03 LAB — TROPONIN I (HIGH SENSITIVITY): Troponin I (High Sensitivity): 12 ng/L (ref <18)

## 2020-02-03 MED ORDER — DIAZEPAM 5 MG PO TABS
5.0000 mg | ORAL_TABLET | Freq: Once | ORAL | Status: AC
  Administered 2020-02-03: 5 mg via ORAL
  Filled 2020-02-03: qty 1

## 2020-02-03 NOTE — ED Provider Notes (Signed)
[facility] DEPT Provider Note   CSN: [id number] Arrival date & time: 02/03/20  1422     History Chief Complaint  Patient presents with  . Spasms    Craig Flynn is a 59 y.o. male.  Presents to ER with concern for muscle spasms.  Reports that over the past few days he has been having intermittent pain in his upper back, worse with certain positions, improved when he seems to lie on his right side.  Currently pain is mild, crampy, occasionally radiates to his chest.  No active chest pain.  No difficulty in breathing.  HPI     Past Medical History:  Diagnosis Date  . Anemia 06/2015  . Arthritis    knee  . Cancer [facility])    thyroid cancer  . Cardiomyopathy ([place])    a. 10/2013: EF reduced to 35-40% b. 09/2014: EF improved to 55-60%, Grade 1 DD noted.  . Chest pain 06/2015  . CHF (congestive heart failure) ([place])   . CKD (chronic kidney disease), stage IV [facility])    sees  Dr Lorrene Reid  . Dyspnea    with exerion   . GERD (gastroesophageal reflux disease)   . Gout   . Hypertension   . Obesity   . Sleep apnea    not wearing c-pap now, needs new slep study per pt. (01/22/2017)  . Tuberculosis 1981   while the pt was in the service.  . Tubular adenoma of colon 10/2015  . Wears glasses     Patient Active Problem List   Diagnosis Date Noted  . Sinusitis 10/16/2019  . Allergic rhinitis 10/16/2019  . CAD (coronary artery disease) 09/07/2019  . Nephrosclerosis 09/07/2019  . Obesity (BMI 30-39.9) 09/07/2019  . Secondary hyperparathyroidism ([place]) 09/07/2019  . Educated about COVID-19 virus infection 06/28/2019  . CKD (chronic kidney disease), stage V ([place]) 06/22/2019  . Obesity, diabetes, and hypertension syndrome ([place]) 06/22/2019  . Ulnar neuritis, right 04/25/2019  . Muscle cramps 02/26/2019  . Abnormal TSH 02/26/2019  . Vitamin D deficiency 02/22/2019  . Iron deficiency 02/22/2019  . Chest pain 11/01/2018  . Laryngopharyngeal reflux (LPR)  10/04/2018  . Post-nasal drainage 10/04/2018  . Whiplash injuries, initial encounter 09/21/2018  . Degenerative arthritis of right knee 09/21/2018  . Chronic diastolic HF (heart failure) ([place]) 07/04/2018  . Intractable post-traumatic headache 06/16/2018  . Low back pain 06/15/2018  . Neck pain 06/15/2018  . 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 Thyroid nodule 01/22/2017  . Ptosis 01/09/2016  . Chronic systolic heart failure ([place]) 08/01/2015  . GERD (gastroesophageal reflux disease) 07/29/2015  . Osteoarthritis 05/03/2015  . Hyperlipidemia 02/26/2015  . Diastolic dysfunction 23/55/7322  . Tobacco abuse 02/26/2015  . OSA (obstructive sleep apnea) 12/27/2014  . Encounter for well adult exam with abnormal findings 11/12/2014  . Anemia in chronic kidney disease 11/12/2014  . Left knee pain 09/22/2014  . Systolic CHF ([place]) 02/54/2706  . Paresthesia of left leg 09/21/2014  . Acute systolic CHF (congestive heart failure) ([place]) 11/17/2013  . CKD (chronic kidney disease) stage 4, GFR 15-29 ml/min (HCC) 11/17/2013  . Morbid obesity ([place]) 11/17/2013  . Diabetes mellitus ([place]) 12/12/2011  . Acute on chronic renal failure ([place]) 12/11/2011  . Hypokalemia 12/11/2011  . HTN (hypertension) 12/11/2011  . SOB (shortness of breath) 12/11/2011  . Gout 12/31/2006  . HERNIATED LUMBAR DISK WITH RADICULOPATHY 12/30/2006    Past Surgical History:  Procedure Laterality Date  . AV FISTULA PLACEMENT Left 03/25/2016  Procedure: Creation of Left Arm ARTERIOVENOUS (AV) FISTULA;  Surgeon: Elam Dutch, MD;  Location: [facility] OR;  Service: Vascular;  Laterality: Left;  . COLONOSCOPY W/ BIOPSIES AND POLYPECTOMY     "no problem"  . HERNIA REPAIR    . LAPAROSCOPIC CHOLECYSTECTOMY    . LIGATION OF COMPETING BRANCHES OF ARTERIOVENOUS FISTULA Left 01/25/2018   Procedure: LIGATION OF COMPETING BRANCHES And Revision of ARTERIOVENOUS FISTULA LEFT ARM.;  Surgeon: Elam Dutch, MD;  Location: [place];   Service: Vascular;  Laterality: Left;  . THYROIDECTOMY  01/22/2017  . THYROIDECTOMY Left 01/22/2017   Procedure: THYROIDECTOMY;  Surgeon: Melida Quitter, MD;  Location: [place];  Service: ENT;  Laterality: Left;  . THYROIDECTOMY Right 03/26/2017   Procedure: COMPLETION OF THYROIDECTOMY;  Surgeon: Melida Quitter, MD;  Location: [place];  Service: ENT;  Laterality: Right;  . UMBILICAL HERNIA REPAIR    . WISDOM TOOTH EXTRACTION         Family History  Problem Relation Age of Onset  . Stroke Mother   . Pneumonia Father        died of Pneumonia x3  . Kidney disease Maternal Grandmother   . Hypertension Maternal Grandmother   . Healthy Maternal Grandfather   . Healthy Paternal Grandmother   . Healthy Paternal Grandfather   . CAD Neg Hx   . Colon cancer Neg Hx   . Esophageal cancer Neg Hx   . Pancreatic cancer Neg Hx   . Prostate cancer Neg Hx   . Stomach cancer Neg Hx   . Rectal cancer Neg Hx     Social History   Tobacco Use  . Smoking status: Current Every Day Smoker    Packs/day: 0.50    Years: 40.00    Pack years: 20.00    Types: Cigarettes  . Smokeless tobacco: Never Used  Vaping Use  . Vaping Use: Never used  Substance Use Topics  . Alcohol use: Not Currently    Alcohol/week: 0.0 standard drinks  . Drug use: No    Home Medications Prior to Admission medications   Medication Sig Start Date End Date Taking? Authorizing Provider  acetaminophen (TYLENOL) 500 MG tablet Take 2 tablets (1,000 mg total) by mouth every 8 (eight) hours as needed for mild pain or headache. 05/05/18   Lance Sell, NP  albuterol (VENTOLIN HFA) 108 (90 Base) MCG/ACT inhaler Inhale 2 puffs into the lungs every 6 (six) hours as needed for wheezing or shortness of breath. 05/24/19   Biagio Borg, MD  allopurinol (ZYLOPRIM) 100 MG tablet Take 150 mg by mouth daily.  10/27/18   [provider]  aspirin EC 81 MG tablet Take 81 mg by mouth daily.    [provider]  calcitRIOL  (ROCALTROL) 0.5 MCG capsule Take 0.5 mcg by mouth every Monday, Wednesday, and Friday. 12/10/18   [provider]  calcium elemental as carbonate (TUMS ULTRA 1000) 400 MG chewable tablet Chew 1,000 mg by mouth as needed for heartburn.     [provider]  carvedilol (COREG) 25 MG tablet TAKE 1 TABLET(25 MG) BY MOUTH TWICE DAILY Patient taking differently: Take 25 mg by mouth 2 (two) times daily with a meal.  05/18/19   Biagio Borg, MD  cyclobenzaprine (FLEXERIL) 5 MG tablet Take 1 tablet (5 mg total) by mouth 3 (three) times daily as needed for muscle spasms. 09/07/19   Biagio Borg, MD  doxycycline (VIBRA-TABS) 100 MG tablet Take 1 tablet (100 mg  total) by mouth 2 (two) times daily. 10/16/19   Biagio Borg, MD  fenofibrate 54 MG tablet TAKE 1 TABLET BY MOUTH EVERY DAY 08/16/19   Minus Breeding, MD  furosemide (LASIX) 80 MG tablet Take 2 tablets (160 mg total) by mouth See admin instructions. Take 160 mg by mouth in the morning and 160 mg in the afternoon 06/23/19   Annita Brod, MD  gabapentin (NEURONTIN) 100 MG capsule Take by mouth. 07/17/16   [provider]  hydrALAZINE (APRESOLINE) 25 MG tablet  07/31/19   [provider]  iron polysaccharides (FERREX 150) 150 MG capsule Take by mouth. 12/18/16   [provider]  isosorbide mononitrate (IMDUR) 30 MG 24 hr tablet TAKE 2 TABLETS(60 MG) BY MOUTH DAILY 01/15/20   Minus Breeding, MD  levothyroxine (SYNTHROID) 137 MCG tablet Take 137 mcg by mouth daily before breakfast.    [provider]  nitroGLYCERIN (NITROSTAT) 0.4 MG SL tablet DISSOLVE 1 TABLET UNDER THE TONGUE EVERY 5 MINUTES AS NEEDED FOR CHEST PAIN Patient taking differently: Place 0.4 mg under the tongue every 5 (five) minutes as needed for chest pain.  03/29/19   Tami Lin, MD  omeprazole (PRILOSEC) 40 MG capsule Take by mouth. 06/08/16   [provider]  pantoprazole (PROTONIX) 40 MG tablet Take 1 tablet (40 mg  total) by mouth 2 (two) times daily before a meal. 11/29/19   Biagio Borg, MD  Potassium Chloride ER 20 MEQ TBCR  08/16/19   [provider]  predniSONE (DELTASONE) 10 MG tablet 3 tabs by mouth per day for 3 days,2ttabs per day for 3 days,1tab per day for 3 days 10/16/19   Biagio Borg, MD  sodium bicarbonate 650 MG tablet Take 1 tablet (650 mg total) by mouth 3 (three) times daily. 05/26/19   Biagio Borg, MD    Allergies    Patient has no known allergies.  Review of Systems   Review of Systems  Constitutional: Negative for chills and fever.  HENT: Negative for ear pain and sore throat.   Eyes: Negative for pain and visual disturbance.  Respiratory: Negative for cough and shortness of breath.   Cardiovascular: Positive for chest pain. Negative for palpitations.  Gastrointestinal: Negative for abdominal pain and vomiting.  Genitourinary: Negative for dysuria and hematuria.  Musculoskeletal: Positive for back pain. Negative for arthralgias.  Skin: Negative for color change and rash.  Neurological: Negative for seizures and syncope.  All other systems reviewed and are negative.   Physical Exam Updated Vital Signs BP (!) 144/67 (BP Location: Right Arm)   Pulse 67   Temp 98.5 F (36.9 C) (Oral)   Resp 12   Ht 6\' 2"  (1.88 m)   Wt 131.5 kg   SpO2 100%   BMI 37.23 kg/m   Physical Exam Vitals and nursing note reviewed.  Constitutional:      Appearance: He is well-developed.  HENT:     Head: Normocephalic and atraumatic.  Eyes:     Conjunctiva/sclera: Conjunctivae normal.  Cardiovascular:     Rate and Rhythm: Normal rate and regular rhythm.     Heart sounds: No murmur heard.   Pulmonary:     Effort: Pulmonary effort is normal. No respiratory distress.     Breath sounds: Normal breath sounds.  Abdominal:     Palpations: Abdomen is soft.     Tenderness: There is no abdominal tenderness.  Musculoskeletal:     Cervical back: Neck supple.  Comments: Some  tenderness across upper back, no midline tenderness or deformity  Skin:    General: Skin is warm and dry.  Neurological:     General: No focal deficit present.     Mental Status: He is alert and oriented to person, place, and time.     ED Results / Procedures / Treatments   Labs (all labs ordered are listed, but only abnormal results are displayed) Labs Reviewed  CBC WITH DIFFERENTIAL/PLATELET - Abnormal; Notable for the following components:      Result Value   RBC 4.02 (*)    Hemoglobin 11.0 (*)    HCT 36.0 (*)    All other components within normal limits  BASIC METABOLIC PANEL - Abnormal; Notable for the following components:   Chloride 97 (*)    Glucose, Bld 104 (*)    BUN 58 (*)    Creatinine, Ser 9.48 (*)    Calcium 10.5 (*)    GFR, Estimated 6 (*)    All other components within normal limits  TROPONIN I (HIGH SENSITIVITY)  TROPONIN I (HIGH SENSITIVITY)    EKG EKG Interpretation  Date/Time:  Saturday February 03 2020 16:17:04 EST Ventricular Rate:  65 PR Interval:    QRS Duration: 106 QT Interval:  411 QTC Calculation: 428 R Axis:   38 Text Interpretation: Sinus rhythm Prolonged PR interval Confirmed by Madalyn Rob [phone]) on 02/03/2020 5:03:36 PM   Radiology DG Chest 2 View  Result Date: 02/03/2020 CLINICAL DATA:  Intermittent left scapular pain radiating to left anterior chest, symptoms for 1 week EXAM: CHEST - 2 VIEW COMPARISON:  11/06/2019 FINDINGS: Frontal and lateral views of the chest demonstrate an unremarkable cardiac silhouette. No airspace disease, effusion, or pneumothorax. No acute bony abnormalities. IMPRESSION: 1. Stable chest, no acute process. Electronically Signed   By: Randa Ngo M.D.   On: 02/03/2020 17:34    Procedures Procedures (including critical care time)  Medications Ordered in ED Medications  diazepam (VALIUM) tablet 5 mg (5 mg Oral Given 02/03/20 1748)    ED Course  I have reviewed the triage vital signs and the  nursing notes.  Pertinent labs & imaging results that were available during my care of the patient were reviewed by me and considered in my medical decision making (see chart for details).    MDM Rules/Calculators/A&P                         59 year old male presents to ER with concern for upper back spasms.  Based on description of symptoms, positional nature, suspect these are indeed spasms.  He also mentioned that at times he had a slight chest discomfort, subsequently obtained EKG, labs and chest x-ray.  EKG without acute ischemic change, troponin normal limits, doubt ACS.  CXR negative.  Creatinine elevated to 9.5, baseline 6.  Recommended patient wait in ER until I can review case with nephrology and ensure he has appropriate follow-up.  Patient reports that he will follow up with his nephrologist on Monday and is unwilling to remain in ER any longer.  In lieu of waiting for further consultation, observation, stressed need for patient to follow-up with his nephrologist as I suspect he likely will need to start dialysis soon.  After the discussed management above, the patient was determined to be safe for discharge.  The patient was in agreement with this plan and all questions regarding their care were answered.  ED return precautions were discussed and  the patient will return to the ED with any significant worsening of condition.  Final Clinical Impression(s) / ED Diagnoses Final diagnoses:  Chronic kidney disease, unspecified CKD stage  Muscle spasm    Rx / DC Orders ED Discharge Orders    None       Lucrezia Starch, MD 02/03/20 1840

## 2020-02-03 NOTE — ED Triage Notes (Signed)
Patient stated left side started having muscle pain, and in between shoulder pain about a week ago  Patient states when he lays on the right side the pain goes away until he positions more towards the left side

## 2020-02-03 NOTE — Discharge Instructions (Addendum)
You must call your kidney doctor on Monday.    Based on your creatinine, you will likely need to start dialysis very soon.  I would additionally recommend follow-up with your primary doctor.  If you develop any worsening chest pain, back pain or other new concerning symptom, return to ER for reassessment.

## 2020-02-06 ENCOUNTER — Encounter: Payer: Self-pay | Admitting: Internal Medicine

## 2020-02-06 ENCOUNTER — Other Ambulatory Visit: Payer: Self-pay

## 2020-02-06 ENCOUNTER — Ambulatory Visit (INDEPENDENT_AMBULATORY_CARE_PROVIDER_SITE_OTHER): Payer: Medicare HMO | Admitting: Internal Medicine

## 2020-02-06 DIAGNOSIS — N185 Chronic kidney disease, stage 5: Secondary | ICD-10-CM

## 2020-02-06 DIAGNOSIS — I10 Essential (primary) hypertension: Secondary | ICD-10-CM

## 2020-02-06 DIAGNOSIS — M549 Dorsalgia, unspecified: Secondary | ICD-10-CM | POA: Diagnosis not present

## 2020-02-06 DIAGNOSIS — E1122 Type 2 diabetes mellitus with diabetic chronic kidney disease: Secondary | ICD-10-CM

## 2020-02-06 MED ORDER — TRAMADOL HCL 50 MG PO TABS: 50.0000 mg | ORAL_TABLET | Freq: Four times a day (QID) | ORAL | 0 refills | Status: DC | PRN

## 2020-02-06 MED ORDER — CYCLOBENZAPRINE HCL 5 MG PO TABS: 5.0000 mg | ORAL_TABLET | Freq: Three times a day (TID) | ORAL | 1 refills | Status: DC | PRN

## 2020-02-06 NOTE — Patient Instructions (Signed)
Please take all new medication as prescribed - the pain medication, and muscle relaxer  Please continue all other medications as before, and refills have been done if requested.  Please have the pharmacy call with any other refills you may need.  Please continue your efforts at being more active, low cholesterol diet, and weight control.  Please keep your appointments with your specialists as you may have planned - the kidney doctors as you have scheduled

## 2020-02-06 NOTE — Progress Notes (Signed)
Subjective:    Patient ID: Craig Flynn, male    DOB: November 07, 1960, 59 y.o.   MRN: [id number]  HPI  Here with c/o 1 wk onset, left upper back and shoulder pain, mild to mod, intermittent, radiates towards the axilla, icy hot and heat not really working. Seen at ED on nov 13, cxr neg for acute.  Pt denies chest pain, increased sob or doe, wheezing, orthopnea, PND, increased LE swelling, palpitations, dizziness or syncope.  Pt denies new neurological symptoms such as new headache, or facial or extremity weakness or numbness   Pt denies polydipsia, polyuria,  Past Medical History:  Diagnosis Date  . Anemia 06/2015  . Arthritis    knee  . Cancer [facility])    thyroid cancer  . Cardiomyopathy ([place])    a. 10/2013: EF reduced to 35-40% b. 09/2014: EF improved to 55-60%, Grade 1 DD noted.  . Chest pain 06/2015  . CHF (congestive heart failure) ([place])   . CKD (chronic kidney disease), stage IV [facility])    sees  Dr Lorrene Reid  . Dyspnea    with exerion   . GERD (gastroesophageal reflux disease)   . Gout   . Hypertension   . Obesity   . Sleep apnea    not wearing c-pap now, needs new slep study per pt. (01/22/2017)  . Tuberculosis 1981   while the pt was in the service.  . Tubular adenoma of colon 10/2015  . Wears glasses    Past Surgical History:  Procedure Laterality Date  . AV FISTULA PLACEMENT Left 03/25/2016   Procedure: Creation of Left Arm ARTERIOVENOUS (AV) FISTULA;  Surgeon: Elam Dutch, MD;  [facility];  Service: Vascular;  Laterality: Left;  . COLONOSCOPY W/ BIOPSIES AND POLYPECTOMY     "no problem"  . HERNIA REPAIR    . LAPAROSCOPIC CHOLECYSTECTOMY    . LIGATION OF COMPETING BRANCHES OF ARTERIOVENOUS FISTULA Left 01/25/2018   Procedure: LIGATION OF COMPETING BRANCHES And Revision of ARTERIOVENOUS FISTULA LEFT ARM.;  Surgeon: Elam Dutch, MD;  Location: [place];  Service: Vascular;  Laterality: Left;  . THYROIDECTOMY  01/22/2017  . THYROIDECTOMY Left 01/22/2017   Procedure:  THYROIDECTOMY;  Surgeon: Melida Quitter, MD;  Location: [place];  Service: ENT;  Laterality: Left;  . THYROIDECTOMY Right 03/26/2017   Procedure: COMPLETION OF THYROIDECTOMY;  Surgeon: Melida Quitter, MD;  Location: [place];  Service: ENT;  Laterality: Right;  . UMBILICAL HERNIA REPAIR    . WISDOM TOOTH EXTRACTION      reports that he has been smoking cigarettes. He has a 20.00 pack-year smoking history. He has never used smokeless tobacco. He reports previous alcohol use. He reports that he does not use drugs. family history includes Healthy in his maternal grandfather, paternal grandfather, and paternal grandmother; Hypertension in his maternal grandmother; Kidney disease in his maternal grandmother; Pneumonia in his father; Stroke in his mother. No Known Allergies Current Outpatient Medications on File Prior to Visit  Medication Sig Dispense Refill  . acetaminophen (TYLENOL) 500 MG tablet Take 2 tablets (1,000 mg total) by mouth every 8 (eight) hours as needed for mild pain or headache. 60 tablet 0  . albuterol (VENTOLIN HFA) 108 (90 Base) MCG/ACT inhaler Inhale 2 puffs into the lungs every 6 (six) hours as needed for wheezing or shortness of breath. 18 g 5  . allopurinol (ZYLOPRIM) 100 MG tablet Take 150 mg by mouth daily.     Marland Kitchen aspirin EC 81 MG tablet Take  81 mg by mouth daily.    . calcitRIOL (ROCALTROL) 0.5 MCG capsule Take 0.5 mcg by mouth every Monday, Wednesday, and Friday.    . calcium elemental as carbonate (TUMS ULTRA 1000) 400 MG chewable tablet Chew 1,000 mg by mouth as needed for heartburn.     . carvedilol (COREG) 25 MG tablet TAKE 1 TABLET(25 MG) BY MOUTH TWICE DAILY (Patient taking differently: Take 25 mg by mouth 2 (two) times daily with a meal. ) 180 tablet 2  . doxycycline (VIBRA-TABS) 100 MG tablet Take 1 tablet (100 mg total) by mouth 2 (two) times daily. 20 tablet 0  . fenofibrate 54 MG tablet TAKE 1 TABLET BY MOUTH EVERY DAY 90 tablet 3  . furosemide (LASIX) 80 MG tablet Take 2  tablets (160 mg total) by mouth See admin instructions. Take 160 mg by mouth in the morning and 160 mg in the afternoon    . gabapentin (NEURONTIN) 100 MG capsule Take by mouth.    . hydrALAZINE (APRESOLINE) 25 MG tablet     . iron polysaccharides (FERREX 150) 150 MG capsule Take by mouth.    . isosorbide mononitrate (IMDUR) 30 MG 24 hr tablet TAKE 2 TABLETS(60 MG) BY MOUTH DAILY 90 tablet 3  . levothyroxine (SYNTHROID) 137 MCG tablet Take 137 mcg by mouth daily before breakfast.    . nitroGLYCERIN (NITROSTAT) 0.4 MG SL tablet DISSOLVE 1 TABLET UNDER THE TONGUE EVERY 5 MINUTES AS NEEDED FOR CHEST PAIN (Patient taking differently: Place 0.4 mg under the tongue every 5 (five) minutes as needed for chest pain. ) 25 tablet 3  . omeprazole (PRILOSEC) 40 MG capsule Take by mouth.    . pantoprazole (PROTONIX) 40 MG tablet Take 1 tablet (40 mg total) by mouth 2 (two) times daily before a meal. 180 tablet 3  . Potassium Chloride ER 20 MEQ TBCR     . predniSONE (DELTASONE) 10 MG tablet 3 tabs by mouth per day for 3 days,2ttabs per day for 3 days,1tab per day for 3 days 18 tablet 0  . sodium bicarbonate 650 MG tablet Take 1 tablet (650 mg total) by mouth 3 (three) times daily. 90 tablet 5   No current facility-administered medications on file prior to visit.   Review of Systems All otherwise neg per pt    Objective:   Physical Exam BP (!) 110/52 (BP Location: Left Arm, Patient Position: Sitting, Cuff Size: Large)   Pulse 70   Temp 98.3 F (36.8 C) (Oral)   Ht 6\' 2"  (1.88 m)   Wt 297 lb (134.7 kg)   SpO2 97%   BMI 38.13 kg/m  VS noted,  Constitutional: Pt appears in NAD HENT: Head: NCAT.  Right Ear: External ear normal.  Left Ear: External ear normal.  Eyes: . Pupils are equal, round, and reactive to light. Conjunctivae and EOM are normal Nose: without d/c or deformity Neck: Neck supple. Gross normal ROM, has mod tender to left trapezoid area Cardiovascular: Normal rate and regular rhythm.    Pulmonary/Chest: Effort normal and breath sounds without rales or wheezing.  Neurological: Pt is alert. At baseline orientation, motor grossly intact Skin: Skin is warm. No rashes, other new lesions, no LE edema Psychiatric: Pt behavior is normal without agitation  All otherwise neg per pt Lab Results  Component Value Date   WBC 6.0 02/03/2020   HGB 11.0 (L) 02/03/2020   HCT 36.0 (L) 02/03/2020   PLT 195 02/03/2020   GLUCOSE 104 (H) 02/03/2020  CHOL 164 10/14/2018   TRIG 182.0 (H) 10/14/2018   HDL 31.10 (L) 10/14/2018   LDLCALC 96 10/14/2018   ALT 14 06/23/2019   AST 13 (L) 06/23/2019   NA 137 02/03/2020   K 4.5 02/03/2020   CL 97 (L) 02/03/2020   CREATININE 9.48 (H) 02/03/2020   BUN 58 (H) 02/03/2020   CO2 27 02/03/2020   TSH 0.016 (L) 06/22/2019   PSA 2.01 10/14/2018   HGBA1C 5.3 11/29/2019      Assessment & Plan:

## 2020-02-11 ENCOUNTER — Encounter: Payer: Self-pay | Admitting: Internal Medicine

## 2020-02-11 NOTE — Assessment & Plan Note (Signed)
Lab Results  Component Value Date   HGBA1C 5.3 11/29/2019  stable overall by history and exam, recent data reviewed with pt, and pt to continue medical treatment as before,  to f/u any worsening symptoms or concerns

## 2020-02-11 NOTE — Assessment & Plan Note (Addendum)
C/w msk pain for unclear etiology, for tramadol prn, flexeril prn,  to f/u any worsening symptoms or concerns  I spent 31 minutes in preparing to see the patient by review of recent labs, imaging and procedures, obtaining and reviewing separately obtained history, communicating with the patient and family or caregiver, ordering medications, tests or procedures, and documenting clinical information in the EHR including the differential Dx, treatment, and any further evaluation and other management of left upper back pain, dm, htn

## 2020-02-11 NOTE — Assessment & Plan Note (Signed)
stable overall by history and exam, recent data reviewed with pt, and pt to continue medical treatment as before,  to f/u any worsening symptoms or concerns  

## 2020-03-08 DIAGNOSIS — Z20822 Contact with and (suspected) exposure to covid-19: Secondary | ICD-10-CM | POA: Diagnosis not present

## 2020-03-17 ENCOUNTER — Encounter (HOSPITAL_COMMUNITY): Payer: Self-pay | Admitting: Emergency Medicine

## 2020-03-17 ENCOUNTER — Emergency Department (HOSPITAL_COMMUNITY)
Admission: EM | Admit: 2020-03-17 | Discharge: 2020-03-17 | Disposition: A | Discharge status: Home / Self Care | Payer: Medicare HMO | Attending: Emergency Medicine | Admitting: Emergency Medicine

## 2020-03-17 ENCOUNTER — Other Ambulatory Visit: Payer: Self-pay

## 2020-03-17 ENCOUNTER — Emergency Department (HOSPITAL_COMMUNITY): Payer: Medicare HMO

## 2020-03-17 DIAGNOSIS — K439 Ventral hernia without obstruction or gangrene: Secondary | ICD-10-CM | POA: Diagnosis not present

## 2020-03-17 DIAGNOSIS — K449 Diaphragmatic hernia without obstruction or gangrene: Secondary | ICD-10-CM | POA: Diagnosis not present

## 2020-03-17 DIAGNOSIS — R143 Flatulence: Secondary | ICD-10-CM | POA: Insufficient documentation

## 2020-03-17 DIAGNOSIS — Z85850 Personal history of malignant neoplasm of thyroid: Secondary | ICD-10-CM | POA: Insufficient documentation

## 2020-03-17 DIAGNOSIS — I132 Hypertensive heart and chronic kidney disease with heart failure and with stage 5 chronic kidney disease, or end stage renal disease: Secondary | ICD-10-CM | POA: Diagnosis not present

## 2020-03-17 DIAGNOSIS — R1030 Lower abdominal pain, unspecified: Secondary | ICD-10-CM | POA: Diagnosis present

## 2020-03-17 DIAGNOSIS — R112 Nausea with vomiting, unspecified: Secondary | ICD-10-CM | POA: Insufficient documentation

## 2020-03-17 DIAGNOSIS — Z79899 Other long term (current) drug therapy: Secondary | ICD-10-CM | POA: Diagnosis not present

## 2020-03-17 DIAGNOSIS — N289 Disorder of kidney and ureter, unspecified: Secondary | ICD-10-CM

## 2020-03-17 DIAGNOSIS — F17210 Nicotine dependence, cigarettes, uncomplicated: Secondary | ICD-10-CM | POA: Diagnosis not present

## 2020-03-17 DIAGNOSIS — N185 Chronic kidney disease, stage 5: Secondary | ICD-10-CM | POA: Insufficient documentation

## 2020-03-17 DIAGNOSIS — I5021 Acute systolic (congestive) heart failure: Secondary | ICD-10-CM | POA: Insufficient documentation

## 2020-03-17 DIAGNOSIS — K429 Umbilical hernia without obstruction or gangrene: Secondary | ICD-10-CM | POA: Insufficient documentation

## 2020-03-17 DIAGNOSIS — I2510 Atherosclerotic heart disease of native coronary artery without angina pectoris: Secondary | ICD-10-CM | POA: Insufficient documentation

## 2020-03-17 DIAGNOSIS — I10 Essential (primary) hypertension: Secondary | ICD-10-CM | POA: Diagnosis not present

## 2020-03-17 DIAGNOSIS — K469 Unspecified abdominal hernia without obstruction or gangrene: Secondary | ICD-10-CM | POA: Diagnosis not present

## 2020-03-17 DIAGNOSIS — I5032 Chronic diastolic (congestive) heart failure: Secondary | ICD-10-CM | POA: Insufficient documentation

## 2020-03-17 LAB — CBC
HCT: 33.4 % — ABNORMAL LOW (ref 39.0–52.0)
Hemoglobin: 10.8 g/dL — ABNORMAL LOW (ref 13.0–17.0)
MCH: 27.7 pg (ref 26.0–34.0)
MCHC: 32.3 g/dL (ref 30.0–36.0)
MCV: 85.6 fL (ref 80.0–100.0)
Platelets: 162 10*3/uL (ref 150–400)
RBC: 3.9 MIL/uL — ABNORMAL LOW (ref 4.22–5.81)
RDW: 13.7 % (ref 11.5–15.5)
WBC: 4.2 10*3/uL (ref 4.0–10.5)
nRBC: 0 % (ref 0.0–0.2)

## 2020-03-17 LAB — COMPREHENSIVE METABOLIC PANEL
ALT: 35 U/L (ref 0–44)
AST: 26 U/L (ref 15–41)
Albumin: 4.2 g/dL (ref 3.5–5.0)
Alkaline Phosphatase: 25 U/L — ABNORMAL LOW (ref 38–126)
Anion gap: 13 (ref 5–15)
BUN: 100 mg/dL — ABNORMAL HIGH (ref 6–20)
CO2: 27 mmol/L (ref 22–32)
Calcium: 13.9 mg/dL (ref 8.9–10.3)
Chloride: 96 mmol/L — ABNORMAL LOW (ref 98–111)
Creatinine, Ser: 13.47 mg/dL — ABNORMAL HIGH (ref 0.61–1.24)
GFR, Estimated: 4 mL/min — ABNORMAL LOW (ref >60)
Glucose, Bld: 101 mg/dL — ABNORMAL HIGH (ref 70–99)
Potassium: 4.5 mmol/L (ref 3.5–5.1)
Sodium: 136 mmol/L (ref 135–145)
Total Bilirubin: 0.7 mg/dL (ref 0.3–1.2)
Total Protein: 6.6 g/dL (ref 6.5–8.1)

## 2020-03-17 LAB — URINALYSIS, ROUTINE W REFLEX MICROSCOPIC
Bacteria, UA: NONE SEEN
Bilirubin Urine: NEGATIVE
Glucose, UA: NEGATIVE mg/dL
Hgb urine dipstick: NEGATIVE
Ketones, ur: NEGATIVE mg/dL
Leukocytes,Ua: NEGATIVE
Nitrite: NEGATIVE
Protein, ur: 30 mg/dL — AB
Specific Gravity, Urine: 1.012 (ref 1.005–1.030)
pH: 5 (ref 5.0–8.0)

## 2020-03-17 LAB — LIPASE, BLOOD: Lipase: 65 U/L — ABNORMAL HIGH (ref 11–51)

## 2020-03-17 MED ORDER — SODIUM CHLORIDE 0.9 % IV BOLUS
500.0000 mL | Freq: Once | INTRAVENOUS | Status: AC
  Administered 2020-03-17: 10:00:00 500 mL via INTRAVENOUS

## 2020-03-17 MED ORDER — TRAMADOL HCL 50 MG PO TABS
50.0000 mg | ORAL_TABLET | Freq: Once | ORAL | Status: AC
  Administered 2020-03-17: 13:00:00 50 mg via ORAL
  Filled 2020-03-17: qty 1

## 2020-03-17 MED ORDER — MORPHINE SULFATE (PF) 4 MG/ML IV SOLN
4.0000 mg | Freq: Once | INTRAVENOUS | Status: AC
  Administered 2020-03-17: 10:00:00 2 mg via INTRAVENOUS
  Filled 2020-03-17: qty 1

## 2020-03-17 MED ORDER — TRAMADOL HCL 50 MG PO TABS
50.0000 mg | ORAL_TABLET | Freq: Four times a day (QID) | ORAL | 0 refills | Status: AC | PRN
Start: 2020-03-17 — End: 2020-03-20

## 2020-03-17 NOTE — Discharge Instructions (Signed)
You have been seen and discharged from the emergency department.  Follow-up with your primary provider for reevaluation.  Establish care with general surgery to be evaluated for hernia repair.  Follow-up with [facility] for high calcium levels.  Stop taking your Calcitrol and Tums, this could be elevating your calcium level.  Take new prescriptions and home medications as prescribed.  Do not mix pain medicine with alcohol, this could result in oversedation.  Do not drive or do heavy activity until you know how this affects you.  It is important that you stay on a bowel regimen to avoid any constipation.  I recommend that you use MiraLAX daily, you can also use a stool softener.  If you have any worsening symptoms, severe unrelenting pain, high fevers, lack of bowel movement, significant abdominal bloating or further concerns for health please return to emergency department for further evaluation.

## 2020-03-17 NOTE — ED Notes (Signed)
Patient transported to CT 

## 2020-03-17 NOTE — ED Provider Notes (Signed)
[facility] EMERGENCY DEPARTMENT Provider Note   CSN: [id number] Arrival date & time: 03/17/20  0002     History Chief Complaint  Patient presents with  . Abdominal Pain    Craig Flynn is a 59 y.o. male.  HPI   59 year old male with past medical history of CKD not on dialysis, HTN, CHF presents the emergency department with concern for lower abdominal pain.  Patient states many years ago he had an abdominal wall hernia repaired with mesh.  States 4 days ago while he was straining for a bowel movement he felt a popping sensation in his lower abdomen.  He then noticed a swelling around his bellybutton that was tender to touch.  He has been drinking MiraLAX and trying to resolve this on his own but he presents today with worsening pain.  He has had nausea with a few episodes of vomiting, last bowel movement was this morning, he is still passing gas.  Denies any fever.  Past Medical History:  Diagnosis Date  . Anemia 06/2015  . Arthritis    knee  . Cancer [facility])    thyroid cancer  . Cardiomyopathy ([place])    a. 10/2013: EF reduced to 35-40% b. 09/2014: EF improved to 55-60%, Grade 1 DD noted.  . Chest pain 06/2015  . CHF (congestive heart failure) ([place])   . CKD (chronic kidney disease), stage IV [facility])    sees  Dr Lorrene Reid  . Dyspnea    with exerion   . GERD (gastroesophageal reflux disease)   . Gout   . Hypertension   . Obesity   . Sleep apnea    not wearing c-pap now, needs new slep study per pt. (01/22/2017)  . Tuberculosis 1981   while the pt was in the service.  . Tubular adenoma of colon 10/2015  . Wears glasses     Patient Active Problem List   Diagnosis Date Noted  . Upper back pain on left side 02/06/2020  . Sinusitis 10/16/2019  . Allergic rhinitis 10/16/2019  . CAD (coronary artery disease) 09/07/2019  . Nephrosclerosis 09/07/2019  . Obesity (BMI 30-39.9) 09/07/2019  . Secondary hyperparathyroidism ([place]) 09/07/2019  . Educated about COVID-19  virus infection 06/28/2019  . CKD (chronic kidney disease), stage V ([place]) 06/22/2019  . Obesity, diabetes, and hypertension syndrome ([place]) 06/22/2019  . Ulnar neuritis, right 04/25/2019  . Muscle cramps 02/26/2019  . Abnormal TSH 02/26/2019  . Vitamin D deficiency 02/22/2019  . Iron deficiency 02/22/2019  . Chest pain 11/01/2018  . Laryngopharyngeal reflux (LPR) 10/04/2018  . Post-nasal drainage 10/04/2018  . Whiplash injuries, initial encounter 09/21/2018  . Degenerative arthritis of right knee 09/21/2018  . Chronic diastolic HF (heart failure) ([place]) 07/04/2018  . Intractable post-traumatic headache 06/16/2018  . Low back pain 06/15/2018  . Neck pain 06/15/2018  . 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 Thyroid nodule 01/22/2017  . Ptosis 01/09/2016  . Chronic systolic heart failure ([place]) 08/01/2015  . GERD (gastroesophageal reflux disease) 07/29/2015  . Osteoarthritis 05/03/2015  . Hyperlipidemia 02/26/2015  . Diastolic dysfunction 63/87/5643  . Tobacco abuse 02/26/2015  . OSA (obstructive sleep apnea) 12/27/2014  . Encounter for well adult exam with abnormal findings 11/12/2014  . Anemia in chronic kidney disease 11/12/2014  . Left knee pain 09/22/2014  . Systolic CHF ([place]) 32/95/1884  . Paresthesia of left leg 09/21/2014  . Acute systolic CHF (congestive heart failure) ([place]) 11/17/2013  . CKD (chronic kidney disease) stage 4, GFR 15-29 ml/min (  [place]) 11/17/2013  . Morbid obesity ([place]) 11/17/2013  . Diabetes mellitus ([place]) 12/12/2011  . Acute on chronic renal failure ([place]) 12/11/2011  . Hypokalemia 12/11/2011  . HTN (hypertension) 12/11/2011  . SOB (shortness of breath) 12/11/2011  . Gout 12/31/2006  . HERNIATED LUMBAR DISK WITH RADICULOPATHY 12/30/2006    Past Surgical History:  Procedure Laterality Date  . AV FISTULA PLACEMENT Left 03/25/2016   Procedure: Creation of Left Arm ARTERIOVENOUS (AV) FISTULA;  Surgeon: Elam Dutch, MD;  Location[facility] OR;  Service:  Vascular;  Laterality: Left;  . COLONOSCOPY W/ BIOPSIES AND POLYPECTOMY     "no problem"  . HERNIA REPAIR    . LAPAROSCOPIC CHOLECYSTECTOMY    . LIGATION OF COMPETING BRANCHES OF ARTERIOVENOUS FISTULA Left 01/25/2018   Procedure: LIGATION OF COMPETING BRANCHES And Revision of ARTERIOVENOUS FISTULA LEFT ARM.;  Surgeon: Elam Dutch, MD;  Location: [place];  Service: Vascular;  Laterality: Left;  . THYROIDECTOMY  01/22/2017  . THYROIDECTOMY Left 01/22/2017   Procedure: THYROIDECTOMY;  Surgeon: Melida Quitter, MD;  Location: [place];  Service: ENT;  Laterality: Left;  . THYROIDECTOMY Right 03/26/2017   Procedure: COMPLETION OF THYROIDECTOMY;  Surgeon: Melida Quitter, MD;  Location: [place];  Service: ENT;  Laterality: Right;  . UMBILICAL HERNIA REPAIR    . WISDOM TOOTH EXTRACTION         Family History  Problem Relation Age of Onset  . Stroke Mother   . Pneumonia Father        died of Pneumonia x3  . Kidney disease Maternal Grandmother   . Hypertension Maternal Grandmother   . Healthy Maternal Grandfather   . Healthy Paternal Grandmother   . Healthy Paternal Grandfather   . CAD Neg Hx   . Colon cancer Neg Hx   . Esophageal cancer Neg Hx   . Pancreatic cancer Neg Hx   . Prostate cancer Neg Hx   . Stomach cancer Neg Hx   . Rectal cancer Neg Hx     Social History   Tobacco Use  . Smoking status: Current Every Day Smoker    Packs/day: 0.50    Years: 40.00    Pack years: 20.00    Types: Cigarettes  . Smokeless tobacco: Never Used  Vaping Use  . Vaping Use: Never used  Substance Use Topics  . Alcohol use: Not Currently    Alcohol/week: 0.0 standard drinks  . Drug use: No    Home Medications Prior to Admission medications   Medication Sig Start Date End Date Taking? Authorizing Provider  acetaminophen (TYLENOL) 500 MG tablet Take 2 tablets (1,000 mg total) by mouth every 8 (eight) hours as needed for mild pain or headache. 05/05/18   Lance Sell, NP  albuterol  (VENTOLIN HFA) 108 (90 Base) MCG/ACT inhaler Inhale 2 puffs into the lungs every 6 (six) hours as needed for wheezing or shortness of breath. 05/24/19   Biagio Borg, MD  allopurinol (ZYLOPRIM) 100 MG tablet Take 150 mg by mouth daily.  10/27/18   [provider]  aspirin EC 81 MG tablet Take 81 mg by mouth daily.    [provider]  calcitRIOL (ROCALTROL) 0.5 MCG capsule Take 0.5 mcg by mouth every Monday, Wednesday, and Friday. 12/10/18   [provider]  calcium elemental as carbonate (TUMS ULTRA 1000) 400 MG chewable tablet Chew 1,000 mg by mouth as needed for heartburn.     [provider]  carvedilol (COREG) 25 MG tablet TAKE 1  TABLET(25 MG) BY MOUTH TWICE DAILY Patient taking differently: Take 25 mg by mouth 2 (two) times daily with a meal.  05/18/19   Biagio Borg, MD  cyclobenzaprine (FLEXERIL) 5 MG tablet Take 1 tablet (5 mg total) by mouth 3 (three) times daily as needed for muscle spasms. 02/06/20   Biagio Borg, MD  doxycycline (VIBRA-TABS) 100 MG tablet Take 1 tablet (100 mg total) by mouth 2 (two) times daily. 10/16/19   Biagio Borg, MD  fenofibrate 54 MG tablet TAKE 1 TABLET BY MOUTH EVERY DAY 08/16/19   Minus Breeding, MD  furosemide (LASIX) 80 MG tablet Take 2 tablets (160 mg total) by mouth See admin instructions. Take 160 mg by mouth in the morning and 160 mg in the afternoon 06/23/19   Annita Brod, MD  gabapentin (NEURONTIN) 100 MG capsule Take by mouth. 07/17/16   [provider]  hydrALAZINE (APRESOLINE) 25 MG tablet  07/31/19   [provider]  iron polysaccharides (FERREX 150) 150 MG capsule Take by mouth. 12/18/16   [provider]  isosorbide mononitrate (IMDUR) 30 MG 24 hr tablet TAKE 2 TABLETS(60 MG) BY MOUTH DAILY 01/15/20   Minus Breeding, MD  levothyroxine (SYNTHROID) 137 MCG tablet Take 137 mcg by mouth daily before breakfast.    [provider]  nitroGLYCERIN (NITROSTAT) 0.4 MG SL tablet  DISSOLVE 1 TABLET UNDER THE TONGUE EVERY 5 MINUTES AS NEEDED FOR CHEST PAIN Patient taking differently: Place 0.4 mg under the tongue every 5 (five) minutes as needed for chest pain.  03/29/19   Tami Lin, MD  omeprazole (PRILOSEC) 40 MG capsule Take by mouth. 06/08/16   [provider]  pantoprazole (PROTONIX) 40 MG tablet Take 1 tablet (40 mg total) by mouth 2 (two) times daily before a meal. 11/29/19   Biagio Borg, MD  Potassium Chloride ER 20 MEQ TBCR  08/16/19   [provider]  predniSONE (DELTASONE) 10 MG tablet 3 tabs by mouth per day for 3 days,2ttabs per day for 3 days,1tab per day for 3 days 10/16/19   Biagio Borg, MD  sodium bicarbonate 650 MG tablet Take 1 tablet (650 mg total) by mouth 3 (three) times daily. 05/26/19   Biagio Borg, MD  traMADol (ULTRAM) 50 MG tablet Take 1 tablet (50 mg total) by mouth every 6 (six) hours as needed. 02/06/20   Biagio Borg, MD    Allergies    Patient has no known allergies.  Review of Systems   Review of Systems  Constitutional: Negative for chills and fever.  HENT: Negative for congestion.   Eyes: Negative for visual disturbance.  Respiratory: Negative for shortness of breath.   Cardiovascular: Negative for chest pain.  Gastrointestinal: Positive for abdominal pain, diarrhea, nausea and vomiting. Negative for anal bleeding.  Genitourinary: Negative for dysuria.  Skin: Negative for rash.  Neurological: Negative for headaches.    Physical Exam Updated Vital Signs BP 131/74 (BP Location: Left Arm)   Pulse 65   Temp 97.7 F (36.5 C) (Oral)   Resp 16   Ht 6\' 2"  (1.88 m)   Wt (!) 150 kg   SpO2 100%   BMI 42.46 kg/m   Physical Exam Vitals and nursing note reviewed.  Constitutional:      Appearance: Normal appearance.  HENT:     Head: Normocephalic.     Mouth/Throat:     Mouth: Mucous membranes are moist.  Cardiovascular:     Rate and  Rhythm: Normal rate.  Pulmonary:     Effort: Pulmonary effort is  normal. No respiratory distress.  Abdominal:     Palpations: Abdomen is soft.     Tenderness: There is abdominal tenderness.     Comments: 2 focal umbilical swellings that are tender to palpation, abdomen is otherwise soft, nondistended, normal bowel sounds  Skin:    General: Skin is warm.  Neurological:     Mental Status: He is alert and oriented to person, place, and time. Mental status is at baseline.  Psychiatric:        Mood and Affect: Mood normal.     ED Results / Procedures / Treatments   Labs (all labs ordered are listed, but only abnormal results are displayed) Labs Reviewed  LIPASE, BLOOD - Abnormal; Notable for the following components:      Result Value   Lipase 65 (*)    All other components within normal limits  COMPREHENSIVE METABOLIC PANEL - Abnormal; Notable for the following components:   Chloride 96 (*)    Glucose, Bld 101 (*)    BUN 100 (*)    Creatinine, Ser 13.47 (*)    Calcium 13.9 (*)    Alkaline Phosphatase 25 (*)    GFR, Estimated 4 (*)    All other components within normal limits  CBC - Abnormal; Notable for the following components:   RBC 3.90 (*)    Hemoglobin 10.8 (*)    HCT 33.4 (*)    All other components within normal limits  URINALYSIS, ROUTINE W REFLEX MICROSCOPIC    EKG EKG Interpretation  Date/Time:  Sunday March 17 2020 10:08:36 EST Ventricular Rate:  65 PR Interval:    QRS Duration: 113 QT Interval:  412 QTC Calculation: 429 R Axis:   24 Text Interpretation: Sinus rhythm Prolonged PR interval Borderline intraventricular conduction delay NSR, no change from previous Confirmed by Renea Schoonmaker ([id number]) on 03/17/2020 10:19:43 AM   Radiology No results found.  Procedures Procedures (including critical care time)  Medications Ordered in ED Medications  sodium chloride 0.9 % bolus 500 mL (has no administration in time range)  morphine 4 MG/ML injection 4 mg (has no administration in time range)    ED Course  I have  reviewed the triage vital signs and the nursing notes.  Pertinent labs & imaging results that were available during my care of the patient were reviewed by me and considered in my medical decision making (see chart for details).    MDM Rules/Calculators/A&P                          59  year old male presents the emergency department abdominal pain.  He has CKD, currently being managed medically.  Story and abdominal exam is concerning for hernia, incarcerated/strangulated hernia.  Blood work shows worsening kidney function with hypercalcemia.  Patient will be hydrated, pain will be treated, CT scan ordered for further evaluation.  And shows a small umbilical hernia without signs of obstruction, inflammation or incarceration/strangulation.  Patient will need to follow-up as an outpatient for hernia repair.  I discussed strict return to ED instructions with him.  In regards to the kidney dysfunction and calcium level I spoke with the on-call nephrologist.  She says to discontinue his calcitriol and Tums usage and they will follow-up with him this week for reevaluation.  Patient has an appointment with his primary doctor tomorrow morning.  He has been given information for general surgery referral  and will also ask his primary doctor tomorrow.  Patient will be discharged and treated as an outpatient.  Discharge plan discussed, patient verbalizes understanding and agreement.  Final Clinical Impression(s) / ED Diagnoses Final diagnoses:  None    Rx / DC Orders ED Discharge Orders    None       Lorelle Gibbs, DO 03/17/20 1314

## 2020-03-17 NOTE — ED Triage Notes (Signed)
Patient reports hypogastric pain with emesis onset this week , no diarrhea , denies fever or chills .

## 2020-03-17 NOTE — ED Notes (Signed)
Out to CT 

## 2020-03-18 ENCOUNTER — Encounter: Payer: Self-pay | Admitting: Internal Medicine

## 2020-03-18 ENCOUNTER — Telehealth: Payer: Self-pay

## 2020-03-18 ENCOUNTER — Ambulatory Visit (INDEPENDENT_AMBULATORY_CARE_PROVIDER_SITE_OTHER): Payer: Medicare HMO | Admitting: Internal Medicine

## 2020-03-18 VITALS — BP 124/76 | HR 66 | Temp 97.6°F | Resp 16 | Ht 74.0 in | Wt 278.0 lb

## 2020-03-18 DIAGNOSIS — E785 Hyperlipidemia, unspecified: Secondary | ICD-10-CM | POA: Diagnosis not present

## 2020-03-18 DIAGNOSIS — E611 Iron deficiency: Secondary | ICD-10-CM

## 2020-03-18 DIAGNOSIS — N2581 Secondary hyperparathyroidism of renal origin: Secondary | ICD-10-CM | POA: Diagnosis not present

## 2020-03-18 DIAGNOSIS — N184 Chronic kidney disease, stage 4 (severe): Secondary | ICD-10-CM

## 2020-03-18 DIAGNOSIS — K429 Umbilical hernia without obstruction or gangrene: Secondary | ICD-10-CM

## 2020-03-18 DIAGNOSIS — E890 Postprocedural hypothyroidism: Secondary | ICD-10-CM | POA: Diagnosis not present

## 2020-03-18 DIAGNOSIS — D539 Nutritional anemia, unspecified: Secondary | ICD-10-CM

## 2020-03-18 DIAGNOSIS — I10 Essential (primary) hypertension: Secondary | ICD-10-CM | POA: Diagnosis not present

## 2020-03-18 NOTE — Telephone Encounter (Signed)
-----   Message from Janith Lima, MD sent at 03/18/2020 11:19 AM EST ----- Regarding: LABS Ask him to come back in and do labs to get ready for the hernia surgery

## 2020-03-18 NOTE — Progress Notes (Signed)
Subjective:  Patient ID: Craig Flynn, male    DOB: 12-13-1960  Age: 59 y.o. MRN: [id number]  CC: Abdominal Pain, Hyperlipidemia, Anemia, and Hypothyroidism  This visit occurred during the SARS-CoV-2 public health emergency.  Safety protocols were in place, including screening questions prior to the visit, additional usage of staff PPE, and extensive cleaning of exam room while observing appropriate contact time as indicated for disinfecting solutions.    HPI Craig Flynn presents for f/up - He was recently seen in the ED for abd pain. A non-contrast CT abd revealed an umbilical hernia. He wants to have it surgically repaired. The abd pain has resolved.  He denies nausea, vomiting, loss of appetite, weight loss, diarrhea, or constipation.  He tells me he is taking his thyroid supplement supplement.  He has a history of secondary hyperparathyroidism.  His recent calcium level was above 13.  He has a history of heart failure.  He denies palpitations, shortness of breath, chest pain, or edema.   Outpatient Medications Prior to Visit  Medication Sig Dispense Refill  . acetaminophen (TYLENOL) 500 MG tablet Take 2 tablets (1,000 mg total) by mouth every 8 (eight) hours as needed for mild pain or headache. 60 tablet 0  . albuterol (VENTOLIN HFA) 108 (90 Base) MCG/ACT inhaler Inhale 2 puffs into the lungs every 6 (six) hours as needed for wheezing or shortness of breath. 18 g 5  . allopurinol (ZYLOPRIM) 100 MG tablet Take 150 mg by mouth daily. Taking 1 & 1/2 tab= 150 mg    . aspirin EC 81 MG tablet Take 81 mg by mouth daily.    . calcitRIOL (ROCALTROL) 0.5 MCG capsule Take 0.5 mcg by mouth every Monday, Wednesday, and Friday.    . calcium elemental as carbonate (BARIATRIC TUMS ULTRA) 400 MG chewable tablet Chew 1,000 mg by mouth as needed for heartburn.     . cyclobenzaprine (FLEXERIL) 5 MG tablet Take 1 tablet (5 mg total) by mouth 3 (three) times daily as needed for muscle spasms. 30 tablet 1  .  fenofibrate 54 MG tablet TAKE 1 TABLET BY MOUTH EVERY DAY (Patient taking differently: Take 54 mg by mouth daily.) 90 tablet 3  . furosemide (LASIX) 80 MG tablet Take 2 tablets (160 mg total) by mouth See admin instructions. Take 160 mg by mouth in the morning and 160 mg in the afternoon    . hydrALAZINE (APRESOLINE) 25 MG tablet Take 25 mg by mouth in the morning and at bedtime.    . iron polysaccharides (NIFEREX) 150 MG capsule Take 150 mg by mouth daily.    . isosorbide mononitrate (IMDUR) 30 MG 24 hr tablet TAKE 2 TABLETS(60 MG) BY MOUTH DAILY (Patient taking differently: Take 60 mg by mouth daily. TAKE 2 TABLETS(60 MG) BY MOUTH DAILY) 90 tablet 3  . nitroGLYCERIN (NITROSTAT) 0.4 MG SL tablet DISSOLVE 1 TABLET UNDER THE TONGUE EVERY 5 MINUTES AS NEEDED FOR CHEST PAIN (Patient taking differently: Place 0.4 mg under the tongue every 5 (five) minutes as needed for chest pain.) 25 tablet 3  . pantoprazole (PROTONIX) 40 MG tablet Take 1 tablet (40 mg total) by mouth 2 (two) times daily before a meal. 180 tablet 3  . Potassium Chloride ER 20 MEQ TBCR Take 20 mEq by mouth daily.    . sodium bicarbonate 650 MG tablet Take 1 tablet (650 mg total) by mouth 3 (three) times daily. 90 tablet 5  . traMADol (ULTRAM) 50 MG tablet Take 1 tablet (  50 mg total) by mouth every 6 (six) hours as needed for up to 3 days. 15 tablet 0  . carvedilol (COREG) 25 MG tablet TAKE 1 TABLET(25 MG) BY MOUTH TWICE DAILY (Patient taking differently: Take 25 mg by mouth 2 (two) times daily with a meal.) 180 tablet 2  . doxycycline (VIBRA-TABS) 100 MG tablet Take 1 tablet (100 mg total) by mouth 2 (two) times daily. 20 tablet 0  . levothyroxine (SYNTHROID) 137 MCG tablet Take 137 mcg by mouth daily before breakfast.    . predniSONE (DELTASONE) 10 MG tablet 3 tabs by mouth per day for 3 days,2ttabs per day for 3 days,1tab per day for 3 days 18 tablet 0   No facility-administered medications prior to visit.    ROS Review of Systems   Constitutional: Negative for chills, diaphoresis, fatigue and fever.  HENT: Negative for trouble swallowing.   Eyes: Negative.   Respiratory: Negative for cough, chest tightness, shortness of breath and wheezing.   Cardiovascular: Negative for chest pain, palpitations and leg swelling.  Gastrointestinal: Negative for abdominal pain, constipation, diarrhea, nausea and vomiting.  Endocrine: Negative.  Negative for cold intolerance, heat intolerance, polydipsia and polyphagia.  Genitourinary: Negative.  Negative for difficulty urinating.  Musculoskeletal: Negative for arthralgias.  Skin: Negative.   Neurological: Negative.  Negative for dizziness, weakness, light-headedness and numbness.  Hematological: Negative for adenopathy. Does not bruise/bleed easily.  Psychiatric/Behavioral: Negative.     Objective:  BP 124/76   Pulse 66   Temp 97.6 F (36.4 C) (Oral)   Resp 16   Ht 6\' 2"  (1.88 m)   Wt 278 lb (126.1 kg)   SpO2 99%   BMI 35.69 kg/m   BP Readings from Last 3 Encounters:  03/18/20 124/76  03/17/20 (!) 149/61  02/06/20 (!) 110/52    Wt Readings from Last 3 Encounters:  03/18/20 278 lb (126.1 kg)  03/17/20 (!) 330 lb 11 oz (150 kg)  02/06/20 297 lb (134.7 kg)    Physical Exam Vitals reviewed.  Constitutional:      Appearance: He is well-developed.  HENT:     Mouth/Throat:     Mouth: Mucous membranes are moist.  Eyes:     General: No scleral icterus.    Conjunctiva/sclera: Conjunctivae normal.  Cardiovascular:     Rate and Rhythm: Normal rate and regular rhythm.     Heart sounds: No murmur heard.   Pulmonary:     Effort: Pulmonary effort is normal.     Breath sounds: No stridor. No wheezing, rhonchi or rales.  Abdominal:     General: Abdomen is protuberant. Bowel sounds are normal. There is no distension.     Palpations: Abdomen is soft. There is no hepatomegaly, splenomegaly or mass.     Tenderness: There is no abdominal tenderness.     Hernia: A hernia  is present. Hernia is present in the umbilical area.  Musculoskeletal:        General: Normal range of motion.     Right lower leg: No edema.     Left lower leg: No edema.  Skin:    General: Skin is warm and dry.     Coloration: Skin is not pale.  Neurological:     General: No focal deficit present.     Mental Status: He is alert and oriented to person, place, and time.  Psychiatric:        Mood and Affect: Mood normal.        Behavior: Behavior normal.  Lab Results  Component Value Date   WBC 5.3 03/21/2020   HGB 10.6 (L) 03/21/2020   HCT 32.9 (L) 03/21/2020   PLT 167.0 03/21/2020   GLUCOSE 101 (H) 03/17/2020   CHOL 125 03/21/2020   TRIG 158.0 (H) 03/21/2020   HDL 28.70 (L) 03/21/2020   LDLCALC 65 03/21/2020   ALT 35 03/17/2020   AST 26 03/17/2020   NA 136 03/17/2020   K 4.5 03/17/2020   CL 96 (L) 03/17/2020   CREATININE 13.47 (H) 03/17/2020   BUN 100 (H) 03/17/2020   CO2 27 03/17/2020   TSH 0.04 (L) 03/21/2020   PSA 2.01 10/14/2018   HGBA1C 5.3 11/29/2019    CT ABDOMEN PELVIS WO CONTRAST  Result Date: 03/17/2020 CLINICAL DATA:  Abdominal pain.  Hernia EXAM: CT ABDOMEN AND PELVIS WITHOUT CONTRAST TECHNIQUE: Multidetector CT imaging of the abdomen and pelvis was performed following the standard protocol without IV contrast. COMPARISON:  None. FINDINGS: Lower chest: Included lung bases are clear.  Heart size is normal. Hepatobiliary: Subcentimeter rounded hypodense lesion within the inferior aspect of the right hepatic lobe (series 3, image 31), too small to definitively characterize, but most likely represents a cyst or hemangioma. Unenhanced liver is otherwise unremarkable. Surgically absent gallbladder. No biliary dilatation. Pancreas: Unremarkable. No pancreatic ductal dilatation or surrounding inflammatory changes. Spleen: Normal in size without focal abnormality. Adrenals/Urinary Tract: Unremarkable adrenal glands. Bilateral kidneys appear slightly atrophic. No  renal stone or hydronephrosis. Unremarkable ureters. Urinary bladder is within normal limits for the degree of distension. Stomach/Bowel: Small hiatal hernia. Stomach otherwise unremarkable. No dilated loops of small bowel. A loop of small bowel extends into patient's umbilical hernia without evidence of obstruction. A normal appendix is present in the right lower quadrant (series 3, image 54). Scattered colonic diverticula. No focal bowel wall thickening or inflammatory changes. Vascular/Lymphatic: Aortoiliac atherosclerosis without aneurysm. No abdominopelvic lymphadenopathy. Reproductive: Prostate is unremarkable. Other: Small umbilical hernia with hernia neck measuring approximately 2.0 cm in diameter. A portion of small bowel partially protrudes into the hernia sac. There is a small amount of fluid present within the hernia sac. Just above and to the right of patient's umbilical hernia is a rounded 1.4 cm fluid density structure (series 3, image 55) within the subcutaneous fat without apparent connection to the overlying skin surface. No ascites. No organized abdominopelvic fluid collection. No pneumoperitoneum. Musculoskeletal: Lower lumbar facet arthropathy. Degenerative changes of the bilateral SI joints and pubic symphysis. Mild bilateral hip joint space narrowing. No acute osseous findings. IMPRESSION: 1. No acute abdominopelvic findings. 2. Small umbilical hernia. A portion of small bowel partially protrudes into the hernia sac without evidence of obstruction. There is a small amount of fluid present within the hernia sac. 3. Rounded 1.4 cm fluid density structure within the periumbilical subcutaneous fat. Findings are nonspecific and may reflect a subcutaneous cyst such as a sebaceous or epidermoid cyst, although no definite connection to the overlying skin surface is evident. A small hematoma, seroma, or focus of fat necrosis is also a consideration. 4. Colonic diverticulosis without evidence of acute  diverticulitis. 5. Small hiatal hernia. 6. Aortic atherosclerosis. (ICD10-I70.0). Electronically Signed   By: Craig Flynn D.O.   On: 03/17/2020 11:22    Assessment & Plan:   Craig Flynn was seen today for abdominal pain, hyperlipidemia, anemia and hypothyroidism.  Diagnoses and all orders for this visit:  Umbilical hernia without obstruction and without gangrene -     Ambulatory referral to General Surgery  Iron deficiency- His  H&H have declined some.  His iron level is normal but his ferritin level is elevated.  I think there is also some component of anemia of chronic disease and renal insufficiency. 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  I recommended that he decrease his dose of levothyroxine. -     TSH; Future -     levothyroxine (SYNTHROID) 100 MCG tablet; Take 1 tablet (100 mcg total) by mouth daily.  Deficiency anemia- See above.  I will screen him for vitamin deficiencies. -     CBC with Differential/Platelet; Future -     Vitamin B12; Future -     Iron; Future -     Folate; Future -     Vitamin B1; Future -     Ferritin; Future  Primary hypertension- His BP is adequately well controlled.  Secondary hyperparathyroidism ([place])- His calcium level is elevated.  I recommended that he see his nephrologist.  Hyperlipidemia LDL goal <130- His ASCVD risk score is less than 10% so I did not recommend a statin for CV risk reduction. -     Lipid panel; Future  CKD (chronic kidney disease) stage 4, GFR 15-29 ml/min (HCC)- His GFR is down to 8.  I recommended he follow-up with his nephrologist. -     Ambulatory referral to Nephrology   I have discontinued Regino Schultze. [facility]'s levothyroxine, predniSONE, and doxycycline. I am also having him start on levothyroxine. Additionally, I am having him maintain his aspirin EC, acetaminophen, allopurinol, calcitRIOL, nitroGLYCERIN, calcium  elemental as carbonate, albuterol, sodium bicarbonate, furosemide, fenofibrate, Potassium Chloride ER, hydrALAZINE, iron polysaccharides, pantoprazole, isosorbide mononitrate, and cyclobenzaprine.  Meds ordered this encounter  Medications  . levothyroxine (SYNTHROID) 100 MCG tablet    Sig: Take 1 tablet (100 mcg total) by mouth daily.    Dispense:  90 tablet    Refill:  0   I spent 50 minutes in preparing to see the patient by review of recent labs, imaging and procedures, obtaining and reviewing separately obtained history, communicating with the patient and family or caregiver, ordering medications, tests or procedures, and documenting clinical information in the EHR including the differential Dx, treatment, and any further evaluation and other management of 1. Umbilical hernia without obstruction and without gangrene 2. Iron deficiency 3. Postoperative hypothyroidism 4. Deficiency anemia 5. Primary hypertension 6. Secondary hyperparathyroidism ([place]) 7. Hyperlipidemia LDL goal <130 8. CKD (chronic kidney disease) stage 4, GFR 15-29 ml/min (HCC)    Follow-up: No follow-ups on file.  Scarlette Calico, MD

## 2020-03-18 NOTE — Telephone Encounter (Signed)
Pt will be coming in for lab work on 12/30 @9am 

## 2020-03-19 ENCOUNTER — Other Ambulatory Visit: Payer: Self-pay | Admitting: Internal Medicine

## 2020-03-19 NOTE — Telephone Encounter (Signed)
Please refill as per office routine med refill policy (all routine meds refilled for 3 mo or monthly per pt preference up to one year from last visit, then month to month grace period for 3 mo, then further med refills will have to be denied)  

## 2020-03-20 ENCOUNTER — Other Ambulatory Visit: Payer: Self-pay

## 2020-03-21 ENCOUNTER — Other Ambulatory Visit (INDEPENDENT_AMBULATORY_CARE_PROVIDER_SITE_OTHER): Payer: Medicare HMO

## 2020-03-21 DIAGNOSIS — E785 Hyperlipidemia, unspecified: Secondary | ICD-10-CM | POA: Diagnosis not present

## 2020-03-21 DIAGNOSIS — D539 Nutritional anemia, unspecified: Secondary | ICD-10-CM

## 2020-03-21 DIAGNOSIS — E611 Iron deficiency: Secondary | ICD-10-CM | POA: Diagnosis not present

## 2020-03-21 DIAGNOSIS — E890 Postprocedural hypothyroidism: Secondary | ICD-10-CM | POA: Diagnosis not present

## 2020-03-21 LAB — CBC WITH DIFFERENTIAL/PLATELET
Basophils Absolute: 0 10*3/uL (ref 0.0–0.1)
Basophils Relative: 0.3 % (ref 0.0–3.0)
Eosinophils Absolute: 0.3 10*3/uL (ref 0.0–0.7)
Eosinophils Relative: 5.1 % — ABNORMAL HIGH (ref 0.0–5.0)
HCT: 32.9 % — ABNORMAL LOW (ref 39.0–52.0)
Hemoglobin: 10.6 g/dL — ABNORMAL LOW (ref 13.0–17.0)
Lymphocytes Relative: 38.5 % (ref 12.0–46.0)
Lymphs Abs: 2 10*3/uL (ref 0.7–4.0)
MCHC: 32.3 g/dL (ref 30.0–36.0)
MCV: 84.3 fl (ref 78.0–100.0)
Monocytes Absolute: 0.5 10*3/uL (ref 0.1–1.0)
Monocytes Relative: 9.4 % (ref 3.0–12.0)
Neutro Abs: 2.5 10*3/uL (ref 1.4–7.7)
Neutrophils Relative %: 46.7 % (ref 43.0–77.0)
Platelets: 167 10*3/uL (ref 150.0–400.0)
RBC: 3.91 Mil/uL — ABNORMAL LOW (ref 4.22–5.81)
RDW: 14.2 % (ref 11.5–15.5)
WBC: 5.3 10*3/uL (ref 4.0–10.5)

## 2020-03-21 LAB — LIPID PANEL
Cholesterol: 125 mg/dL (ref 0–200)
HDL: 28.7 mg/dL — ABNORMAL LOW (ref >39.00)
LDL Cholesterol: 65 mg/dL (ref 0–99)
NonHDL: 96.37
Total CHOL/HDL Ratio: 4
Triglycerides: 158 mg/dL — ABNORMAL HIGH (ref 0.0–149.0)
VLDL: 31.6 mg/dL (ref 0.0–40.0)

## 2020-03-21 LAB — IRON: Iron: 129 ug/dL (ref 42–165)

## 2020-03-21 LAB — FERRITIN: Ferritin: 616.7 ng/mL — ABNORMAL HIGH (ref 22.0–322.0)

## 2020-03-21 LAB — FOLATE: Folate: 7.1 ng/mL (ref >5.9)

## 2020-03-21 LAB — VITAMIN B12: Vitamin B-12: 262 pg/mL (ref 211–911)

## 2020-03-21 LAB — TSH: TSH: 0.04 u[IU]/mL — ABNORMAL LOW (ref 0.35–4.50)

## 2020-03-24 MED ORDER — LEVOTHYROXINE SODIUM 100 MCG PO TABS: 100.0000 ug | ORAL_TABLET | Freq: Every day | ORAL | 0 refills | Status: DC

## 2020-03-25 ENCOUNTER — Other Ambulatory Visit: Payer: Self-pay | Admitting: Internal Medicine

## 2020-03-25 DIAGNOSIS — D538 Other specified nutritional anemias: Secondary | ICD-10-CM | POA: Insufficient documentation

## 2020-03-25 HISTORY — DX: Other specified nutritional anemias: D53.8

## 2020-03-25 LAB — VITAMIN B1: Vitamin B1 (Thiamine): 7 nmol/L — ABNORMAL LOW (ref 8–30)

## 2020-03-25 MED ORDER — VITAMIN B-1 50 MG PO TABS: 50.0000 mg | ORAL_TABLET | Freq: Every day | ORAL | 1 refills | Status: DC

## 2020-03-26 ENCOUNTER — Emergency Department (HOSPITAL_COMMUNITY): Payer: Medicare HMO

## 2020-03-26 ENCOUNTER — Encounter (HOSPITAL_COMMUNITY): Payer: Self-pay

## 2020-03-26 ENCOUNTER — Other Ambulatory Visit: Payer: Self-pay

## 2020-03-26 ENCOUNTER — Emergency Department (HOSPITAL_COMMUNITY)
Admission: EM | Admit: 2020-03-26 | Discharge: 2020-03-26 | Disposition: A | Discharge status: Home / Self Care | Payer: Medicare HMO | Attending: Emergency Medicine | Admitting: Emergency Medicine

## 2020-03-26 DIAGNOSIS — F17210 Nicotine dependence, cigarettes, uncomplicated: Secondary | ICD-10-CM | POA: Insufficient documentation

## 2020-03-26 DIAGNOSIS — I951 Orthostatic hypotension: Secondary | ICD-10-CM

## 2020-03-26 DIAGNOSIS — E1122 Type 2 diabetes mellitus with diabetic chronic kidney disease: Secondary | ICD-10-CM | POA: Insufficient documentation

## 2020-03-26 DIAGNOSIS — R109 Unspecified abdominal pain: Secondary | ICD-10-CM | POA: Insufficient documentation

## 2020-03-26 DIAGNOSIS — Z85850 Personal history of malignant neoplasm of thyroid: Secondary | ICD-10-CM | POA: Insufficient documentation

## 2020-03-26 DIAGNOSIS — M79605 Pain in left leg: Secondary | ICD-10-CM | POA: Insufficient documentation

## 2020-03-26 DIAGNOSIS — E039 Hypothyroidism, unspecified: Secondary | ICD-10-CM | POA: Diagnosis not present

## 2020-03-26 DIAGNOSIS — I5032 Chronic diastolic (congestive) heart failure: Secondary | ICD-10-CM | POA: Insufficient documentation

## 2020-03-26 DIAGNOSIS — Z79899 Other long term (current) drug therapy: Secondary | ICD-10-CM | POA: Insufficient documentation

## 2020-03-26 DIAGNOSIS — M79604 Pain in right leg: Secondary | ICD-10-CM | POA: Diagnosis not present

## 2020-03-26 DIAGNOSIS — I2510 Atherosclerotic heart disease of native coronary artery without angina pectoris: Secondary | ICD-10-CM | POA: Insufficient documentation

## 2020-03-26 DIAGNOSIS — R200 Anesthesia of skin: Secondary | ICD-10-CM | POA: Insufficient documentation

## 2020-03-26 DIAGNOSIS — R0789 Other chest pain: Secondary | ICD-10-CM | POA: Diagnosis not present

## 2020-03-26 DIAGNOSIS — N185 Chronic kidney disease, stage 5: Secondary | ICD-10-CM | POA: Diagnosis not present

## 2020-03-26 DIAGNOSIS — M545 Low back pain, unspecified: Secondary | ICD-10-CM | POA: Insufficient documentation

## 2020-03-26 DIAGNOSIS — M47817 Spondylosis without myelopathy or radiculopathy, lumbosacral region: Secondary | ICD-10-CM | POA: Diagnosis not present

## 2020-03-26 DIAGNOSIS — R0602 Shortness of breath: Secondary | ICD-10-CM | POA: Diagnosis not present

## 2020-03-26 DIAGNOSIS — I700 Atherosclerosis of aorta: Secondary | ICD-10-CM | POA: Diagnosis not present

## 2020-03-26 DIAGNOSIS — Z7982 Long term (current) use of aspirin: Secondary | ICD-10-CM | POA: Diagnosis not present

## 2020-03-26 DIAGNOSIS — S39012A Strain of muscle, fascia and tendon of lower back, initial encounter: Secondary | ICD-10-CM

## 2020-03-26 DIAGNOSIS — I739 Peripheral vascular disease, unspecified: Secondary | ICD-10-CM | POA: Diagnosis not present

## 2020-03-26 DIAGNOSIS — I120 Hypertensive chronic kidney disease with stage 5 chronic kidney disease or end stage renal disease: Secondary | ICD-10-CM | POA: Diagnosis not present

## 2020-03-26 DIAGNOSIS — I132 Hypertensive heart and chronic kidney disease with heart failure and with stage 5 chronic kidney disease, or end stage renal disease: Secondary | ICD-10-CM | POA: Diagnosis not present

## 2020-03-26 LAB — COMPREHENSIVE METABOLIC PANEL
ALT: 39 U/L (ref 0–44)
AST: 27 U/L (ref 15–41)
Albumin: 3.9 g/dL (ref 3.5–5.0)
Alkaline Phosphatase: 21 U/L — ABNORMAL LOW (ref 38–126)
Anion gap: 14 (ref 5–15)
BUN: 86 mg/dL — ABNORMAL HIGH (ref 6–20)
CO2: 24 mmol/L (ref 22–32)
Calcium: 10 mg/dL (ref 8.9–10.3)
Chloride: 97 mmol/L — ABNORMAL LOW (ref 98–111)
Creatinine, Ser: 12 mg/dL — ABNORMAL HIGH (ref 0.61–1.24)
GFR, Estimated: 4 mL/min — ABNORMAL LOW (ref >60)
Glucose, Bld: 125 mg/dL — ABNORMAL HIGH (ref 70–99)
Potassium: 4 mmol/L (ref 3.5–5.1)
Sodium: 135 mmol/L (ref 135–145)
Total Bilirubin: 0.6 mg/dL (ref 0.3–1.2)
Total Protein: 6.1 g/dL — ABNORMAL LOW (ref 6.5–8.1)

## 2020-03-26 LAB — CBC WITH DIFFERENTIAL/PLATELET
Abs Immature Granulocytes: 0.02 10*3/uL (ref 0.00–0.07)
Basophils Absolute: 0 10*3/uL (ref 0.0–0.1)
Basophils Relative: 0 %
Eosinophils Absolute: 0.2 10*3/uL (ref 0.0–0.5)
Eosinophils Relative: 4 %
HCT: 31.1 % — ABNORMAL LOW (ref 39.0–52.0)
Hemoglobin: 9.6 g/dL — ABNORMAL LOW (ref 13.0–17.0)
Immature Granulocytes: 0 %
Lymphocytes Relative: 32 %
Lymphs Abs: 1.7 10*3/uL (ref 0.7–4.0)
MCH: 27 pg (ref 26.0–34.0)
MCHC: 30.9 g/dL (ref 30.0–36.0)
MCV: 87.6 fL (ref 80.0–100.0)
Monocytes Absolute: 0.4 10*3/uL (ref 0.1–1.0)
Monocytes Relative: 7 %
Neutro Abs: 2.9 10*3/uL (ref 1.7–7.7)
Neutrophils Relative %: 57 %
Platelets: 192 10*3/uL (ref 150–400)
RBC: 3.55 MIL/uL — ABNORMAL LOW (ref 4.22–5.81)
RDW: 14 % (ref 11.5–15.5)
WBC: 5.2 10*3/uL (ref 4.0–10.5)
nRBC: 0 % (ref 0.0–0.2)

## 2020-03-26 LAB — MAGNESIUM: Magnesium: 2.3 mg/dL (ref 1.7–2.4)

## 2020-03-26 MED ORDER — FUROSEMIDE 80 MG PO TABS: 120.0000 mg | ORAL_TABLET | ORAL | Status: DC

## 2020-03-26 MED ORDER — HYDROCODONE-ACETAMINOPHEN 5-325 MG PO TABS: 1.0000 | ORAL_TABLET | ORAL | 0 refills | Status: DC | PRN

## 2020-03-26 MED ORDER — FENTANYL CITRATE (PF) 100 MCG/2ML IJ SOLN
50.0000 ug | Freq: Once | INTRAMUSCULAR | Status: AC
  Administered 2020-03-26: 50 ug via INTRAVENOUS
  Filled 2020-03-26: qty 2

## 2020-03-26 MED ORDER — LACTATED RINGERS IV BOLUS
1000.0000 mL | Freq: Once | INTRAVENOUS | Status: AC
  Administered 2020-03-26: 1000 mL via INTRAVENOUS

## 2020-03-26 NOTE — ED Provider Notes (Signed)
[facility] EMERGENCY DEPARTMENT Provider Note   CSN: [id number] Arrival date & time: 03/26/20  5093     History Chief Complaint  Patient presents with  . Leg Pain    Craig Flynn is a 60 y.o. male.  60 year old male who presents to the emergency department today with multiple complaints.  Patient states that he has been having left-sided back pain for a few days.  States that it radiates to his axilla stones radiates down towards his buttock.  He also has some bilateral leg pain.  He does not know how long its been going on.  No nausea or vomiting.  No diarrhea or constipation.  No fevers.  No falls.  No history of IV drug use.  No urinary changes.  No decree sensation in the perineum.  He also complains of third fourth and fifth digit numbness intermittently sometimes in the mornings.  Improves quickly with time.        Past Medical History:  Diagnosis Date  . Anemia 06/2015  . Arthritis    knee  . Cancer [facility])    thyroid cancer  . Cardiomyopathy ([place])    a. 10/2013: EF reduced to 35-40% b. 09/2014: EF improved to 55-60%, Grade 1 DD noted.  . Chest pain 06/2015  . CHF (congestive heart failure) ([place])   . CKD (chronic kidney disease), stage IV [facility])    sees  Dr Lorrene Reid  . Dyspnea    with exerion   . GERD (gastroesophageal reflux disease)   . Gout   . Hypertension   . Obesity   . Sleep apnea    not wearing c-pap now, needs new slep study per pt. (01/22/2017)  . Tuberculosis 1981   while the pt was in the service.  . Tubular adenoma of colon 10/2015  . Wears glasses     Patient Active Problem List   Diagnosis Date Noted  . Anemia due to acquired thiamine deficiency 03/25/2020  . Umbilical hernia without obstruction and without gangrene 03/18/2020  . 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 Deficiency anemia 03/18/2020  . Hyperlipidemia LDL goal <130 03/18/2020  . Upper back pain on left side 02/06/2020  . Sinusitis 10/16/2019  . Allergic  rhinitis 10/16/2019  . CAD (coronary artery disease) 09/07/2019  . Nephrosclerosis 09/07/2019  . Obesity (BMI 30-39.9) 09/07/2019  . Secondary hyperparathyroidism ([place]) 09/07/2019  . CKD (chronic kidney disease), stage V ([place]) 06/22/2019  . Obesity, diabetes, and hypertension syndrome ([place]) 06/22/2019  . Ulnar neuritis, right 04/25/2019  . Muscle cramps 02/26/2019  . Abnormal TSH 02/26/2019  . Vitamin D deficiency 02/22/2019  . Iron deficiency 02/22/2019  . Chest pain 11/01/2018  . Laryngopharyngeal reflux (LPR) 10/04/2018  . Post-nasal drainage 10/04/2018  . Whiplash injuries, initial encounter 09/21/2018  . Degenerative arthritis of right knee 09/21/2018  . Chronic diastolic HF (heart failure) ([place]) 07/04/2018  . Intractable post-traumatic headache 06/16/2018  . Low back pain 06/15/2018  . Neck pain 06/15/2018  . 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 Thyroid nodule 01/22/2017  . Ptosis 01/09/2016  . Chronic systolic heart failure ([place]) 08/01/2015  . GERD (gastroesophageal reflux disease) 07/29/2015  . Osteoarthritis 05/03/2015  . Hyperlipidemia 02/26/2015  . Diastolic dysfunction 26/71/2458  . Tobacco abuse 02/26/2015  . OSA (obstructive sleep apnea) 12/27/2014  . Encounter for well adult exam with abnormal findings 11/12/2014  . Anemia in chronic kidney disease 11/12/2014  . Left knee pain 09/22/2014  . Systolic CHF ([place]) 09/98/3382  . Paresthesia  of left leg 09/21/2014  . Acute systolic CHF (congestive heart failure) ([place]) 11/17/2013  . CKD (chronic kidney disease) stage 4, GFR 15-29 ml/min (HCC) 11/17/2013  . Morbid obesity ([place]) 11/17/2013  . Diabetes mellitus ([place]) 12/12/2011  . Acute on chronic renal failure ([place]) 12/11/2011  . HTN (hypertension) 12/11/2011  . SOB (shortness of breath) 12/11/2011  . Gout 12/31/2006  . HERNIATED LUMBAR DISK WITH RADICULOPATHY 12/30/2006    Past Surgical History:  Procedure Laterality Date  . AV FISTULA PLACEMENT Left  03/25/2016   Procedure: Creation of Left Arm ARTERIOVENOUS (AV) FISTULA;  Surgeon: Elam Dutch, MD;  Location: [facility] OR;  Service: Vascular;  Laterality: Left;  . COLONOSCOPY W/ BIOPSIES AND POLYPECTOMY     "no problem"  . HERNIA REPAIR    . LAPAROSCOPIC CHOLECYSTECTOMY    . LIGATION OF COMPETING BRANCHES OF ARTERIOVENOUS FISTULA Left 01/25/2018   Procedure: LIGATION OF COMPETING BRANCHES And Revision of ARTERIOVENOUS FISTULA LEFT ARM.;  Surgeon: Elam Dutch, MD;  Location: [place];  Service: Vascular;  Laterality: Left;  . THYROIDECTOMY  01/22/2017  . THYROIDECTOMY Left 01/22/2017   Procedure: THYROIDECTOMY;  Surgeon: Melida Quitter, MD;  Location: [place];  Service: ENT;  Laterality: Left;  . THYROIDECTOMY Right 03/26/2017   Procedure: COMPLETION OF THYROIDECTOMY;  Surgeon: Melida Quitter, MD;  Location: [place];  Service: ENT;  Laterality: Right;  . UMBILICAL HERNIA REPAIR    . WISDOM TOOTH EXTRACTION         Family History  Problem Relation Age of Onset  . Stroke Mother   . Pneumonia Father        died of Pneumonia x3  . Kidney disease Maternal Grandmother   . Hypertension Maternal Grandmother   . Healthy Maternal Grandfather   . Healthy Paternal Grandmother   . Healthy Paternal Grandfather   . CAD Neg Hx   . Colon cancer Neg Hx   . Esophageal cancer Neg Hx   . Pancreatic cancer Neg Hx   . Prostate cancer Neg Hx   . Stomach cancer Neg Hx   . Rectal cancer Neg Hx     Social History   Tobacco Use  . Smoking status: Current Every Day Smoker    Packs/day: 0.50    Years: 40.00    Pack years: 20.00    Types: Cigarettes  . Smokeless tobacco: Never Used  Vaping Use  . Vaping Use: Never used  Substance Use Topics  . Alcohol use: Not Currently    Alcohol/week: 0.0 standard drinks  . Drug use: No    Home Medications Prior to Admission medications   Medication Sig Start Date End Date Taking? Authorizing Provider  acetaminophen (TYLENOL) 500 MG tablet Take 2 tablets (1,000  mg total) by mouth every 8 (eight) hours as needed for mild pain or headache. 05/05/18   Lance Sell, NP  albuterol (VENTOLIN HFA) 108 (90 Base) MCG/ACT inhaler Inhale 2 puffs into the lungs every 6 (six) hours as needed for wheezing or shortness of breath. 05/24/19   Biagio Borg, MD  allopurinol (ZYLOPRIM) 100 MG tablet Take 150 mg by mouth daily. Taking 1 & 1/2 tab= 150 mg 10/27/18   [provider]  aspirin EC 81 MG tablet Take 81 mg by mouth daily.    [provider]  calcitRIOL (ROCALTROL) 0.5 MCG capsule Take 0.5 mcg by mouth every Monday, Wednesday, and Friday. 12/10/18   [provider]  calcium elemental as carbonate (BARIATRIC TUMS ULTRA) 400  MG chewable tablet Chew 1,000 mg by mouth as needed for heartburn.     [provider]  carvedilol (COREG) 25 MG tablet TAKE 1 TABLET(25 MG) BY MOUTH TWICE DAILY 03/20/20   Biagio Borg, MD  cyclobenzaprine (FLEXERIL) 5 MG tablet Take 1 tablet (5 mg total) by mouth 3 (three) times daily as needed for muscle spasms. 02/06/20   Biagio Borg, MD  fenofibrate 54 MG tablet TAKE 1 TABLET BY MOUTH EVERY DAY Patient taking differently: Take 54 mg by mouth daily. 08/16/19   Minus Breeding, MD  furosemide (LASIX) 80 MG tablet Take 2 tablets (160 mg total) by mouth See admin instructions. Take 160 mg by mouth in the morning and 160 mg in the afternoon 06/23/19   Annita Brod, MD  hydrALAZINE (APRESOLINE) 25 MG tablet Take 25 mg by mouth in the morning and at bedtime. 07/31/19   [provider]  iron polysaccharides (NIFEREX) 150 MG capsule Take 150 mg by mouth daily. 12/18/16   [provider]  isosorbide mononitrate (IMDUR) 30 MG 24 hr tablet TAKE 2 TABLETS(60 MG) BY MOUTH DAILY Patient taking differently: Take 60 mg by mouth daily. TAKE 2 TABLETS(60 MG) BY MOUTH DAILY 01/15/20   Minus Breeding, MD  levothyroxine (SYNTHROID) 100 MCG tablet Take 1 tablet (100 mcg total) by mouth daily. 03/24/20   Janith Lima, MD  nitroGLYCERIN (NITROSTAT) 0.4 MG SL tablet DISSOLVE 1 TABLET UNDER THE TONGUE EVERY 5 MINUTES AS NEEDED FOR CHEST PAIN Patient taking differently: Place 0.4 mg under the tongue every 5 (five) minutes as needed for chest pain. 03/29/19   Tami Lin, MD  pantoprazole (PROTONIX) 40 MG tablet Take 1 tablet (40 mg total) by mouth 2 (two) times daily before a meal. 11/29/19   Biagio Borg, MD  Potassium Chloride ER 20 MEQ TBCR Take 20 mEq by mouth daily. 08/16/19   [provider]  sodium bicarbonate 650 MG tablet Take 1 tablet (650 mg total) by mouth 3 (three) times daily. 05/26/19   Biagio Borg, MD  thiamine (VITAMIN B-1) 50 MG tablet Take 1 tablet (50 mg total) by mouth daily. 03/25/20   Janith Lima, MD    Allergies    Patient has no known allergies.  Review of Systems   Review of Systems  All other systems reviewed and are negative.   Physical Exam Updated Vital Signs BP (!) 70/50 (BP Location: Right Arm)   Pulse 71   Temp 98.4 F (36.9 C) (Oral)   Resp 18   Ht 6\' 2"  (1.88 m)   Wt 126.1 kg   SpO2 100%   BMI 35.69 kg/m   Physical Exam Vitals and nursing note reviewed.  Constitutional:      Appearance: He is well-developed and well-nourished.  HENT:     Head: Normocephalic and atraumatic.     Mouth/Throat:     Mouth: Mucous membranes are moist.     Pharynx: Oropharynx is clear.  Eyes:     Pupils: Pupils are equal, round, and reactive to light.  Cardiovascular:     Rate and Rhythm: Normal rate.  Pulmonary:     Effort: Pulmonary effort is normal. No respiratory distress.  Abdominal:     General: There is no distension.  Musculoskeletal:        General: No swelling or tenderness. Normal range of motion.     Cervical back: Normal range of motion.  Skin:    General: Skin is  warm and dry.     Coloration: Skin is not jaundiced or pale.  Neurological:     General: No focal deficit present.     Mental Status: He is alert.     ED Results /  Procedures / Treatments   Labs (all labs ordered are listed, but only abnormal results are displayed) Labs Reviewed  CBC WITH DIFFERENTIAL/PLATELET - Abnormal; Notable for the following components:      Result Value   RBC 3.55 (*)    Hemoglobin 9.6 (*)    HCT 31.1 (*)    All other components within normal limits  COMPREHENSIVE METABOLIC PANEL - Abnormal; Notable for the following components:   Chloride 97 (*)    Glucose, Bld 125 (*)    BUN 86 (*)    Creatinine, Ser 12.00 (*)    Total Protein 6.1 (*)    Alkaline Phosphatase 21 (*)    GFR, Estimated 4 (*)    All other components within normal limits  MAGNESIUM    EKG EKG Interpretation  Date/Time:  Tuesday March 26 2020 05:40:52 EST Ventricular Rate:  68 PR Interval:  200 QRS Duration: 92 QT Interval:  436 QTC Calculation: 463 R Axis:   37 Text Interpretation: Normal sinus rhythm Normal ECG Confirmed by Merrily Pew [phone]) on 03/26/2020 5:55:21 AM   Radiology DG Chest Portable 1 View  Result Date: 03/26/2020 CLINICAL DATA:  Upper chest wall pain and shortness of breath EXAM: PORTABLE CHEST 1 VIEW COMPARISON:  02/03/2020 FINDINGS: Artifact from EKG leads. Normal heart size and mediastinal contours. No acute infiltrate or edema. No effusion or pneumothorax. No acute osseous findings. IMPRESSION: No evidence of active disease. Electronically Signed   By: Monte Fantasia M.D.   On: 03/26/2020 06:17    Procedures Procedures (including critical care time)  Medications Ordered in ED Medications  fentaNYL (SUBLIMAZE) injection 50 mcg (has no administration in time range)  lactated ringers bolus 1,000 mL (1,000 mLs Intravenous New Bag/Given 03/26/20 0612)    ED Course  I have reviewed the triage vital signs and the nursing notes.  Pertinent labs & imaging results that were available during my care of the patient were reviewed by me and considered in my medical decision making (see chart for details).    MDM  Rules/Calculators/A&P                          Consider possible aortic dissection however normal pulses, normal mediastinum on x-ray, normal vital signs make that unlikely.  Consider possible kidney stone we will get a CT scan to evaluate for that.  I reviewed the records it does appear he has had back pain for a while.  If the stuff stable can be followed up by his doctor.  Had an episode of syncope or near syncope with triage nurse.  His blood pressure was slightly low at that time.  Fluids are being administered.  He feels okay at this moment.  Care transferred pending CT results and reeval for likely discharge.   Final Clinical Impression(s) / ED Diagnoses Final diagnoses:  None    Rx / DC Orders ED Discharge Orders    None       Kelsey Edman, [name], MD 03/26/20 2349

## 2020-03-26 NOTE — ED Notes (Signed)
During triage process, patient with ?syncopal episode, BP 80/63, patient reports arm and leg numbness, unable to obtain BP in L arm d/t AVF

## 2020-03-26 NOTE — Discharge Instructions (Addendum)
Change your lasix dose to 120 mg (1.5 tablets) twice a day.

## 2020-03-26 NOTE — ED Triage Notes (Signed)
Patient reports bilateral leg pain and weakness with dizziness and lightheadedness, states symptoms started at 0800 on 1/3. Also reports L sided back pain.

## 2020-03-26 NOTE — ED Provider Notes (Signed)
CT renal without anything acute.  Pt is orthostatic and is better after fluids.  Pt is on 160 mg lasix bid, so I am going to back him off to 120 mg bid for now.  Pt's back pain is the same as it was in November when he saw his pcp.  I will give him a short course of lortab (#10) as he is unable to have NSAIDS and the tramadol is not helping.   Pt has an appt with his nephrologist coming up in the next few weeks.  He is to return if worse.  F/u with pcp and with nephrologist.  CT renal:  IMPRESSION:  No acute intra-abdominal pathology identified. No radiographic  explanation for the patient's reported symptoms.    Small umbilical hernia again identified containing a single loop of  unremarkable small bowel.    Peripheral vascular disease.    Aortic Atherosclerosis (ICD10-I70.0).      Isla Pence, MD 03/26/20 [phone]

## 2020-04-17 DIAGNOSIS — K432 Incisional hernia without obstruction or gangrene: Secondary | ICD-10-CM | POA: Diagnosis not present

## 2020-04-17 DIAGNOSIS — N189 Chronic kidney disease, unspecified: Secondary | ICD-10-CM | POA: Diagnosis not present

## 2020-05-28 ENCOUNTER — Ambulatory Visit: Payer: Medicare HMO | Admitting: Internal Medicine

## 2020-06-04 ENCOUNTER — Ambulatory Visit: Payer: Medicare HMO | Admitting: Internal Medicine

## 2020-06-06 ENCOUNTER — Other Ambulatory Visit: Payer: Self-pay

## 2020-06-06 ENCOUNTER — Encounter (HOSPITAL_COMMUNITY): Payer: Self-pay

## 2020-06-06 ENCOUNTER — Emergency Department (HOSPITAL_COMMUNITY)
Admission: EM | Admit: 2020-06-06 | Discharge: 2020-06-06 | Disposition: A | Discharge status: Home / Self Care | Payer: Medicare HMO | Attending: Emergency Medicine | Admitting: Emergency Medicine

## 2020-06-06 ENCOUNTER — Emergency Department (HOSPITAL_COMMUNITY): Payer: Medicare HMO

## 2020-06-06 DIAGNOSIS — Z5321 Procedure and treatment not carried out due to patient leaving prior to being seen by health care provider: Secondary | ICD-10-CM | POA: Insufficient documentation

## 2020-06-06 DIAGNOSIS — R079 Chest pain, unspecified: Secondary | ICD-10-CM | POA: Insufficient documentation

## 2020-06-06 DIAGNOSIS — R0602 Shortness of breath: Secondary | ICD-10-CM | POA: Diagnosis not present

## 2020-06-06 LAB — BASIC METABOLIC PANEL
Anion gap: 11 (ref 5–15)
BUN: 72 mg/dL — ABNORMAL HIGH (ref 6–20)
CO2: 29 mmol/L (ref 22–32)
Calcium: 10.8 mg/dL — ABNORMAL HIGH (ref 8.9–10.3)
Chloride: 96 mmol/L — ABNORMAL LOW (ref 98–111)
Creatinine, Ser: 10.16 mg/dL — ABNORMAL HIGH (ref 0.61–1.24)
GFR, Estimated: 5 mL/min — ABNORMAL LOW (ref >60)
Glucose, Bld: 97 mg/dL (ref 70–99)
Potassium: 4.2 mmol/L (ref 3.5–5.1)
Sodium: 136 mmol/L (ref 135–145)

## 2020-06-06 LAB — TROPONIN I (HIGH SENSITIVITY)
Troponin I (High Sensitivity): 21 ng/L — ABNORMAL HIGH (ref <18)
Troponin I (High Sensitivity): 20 ng/L — ABNORMAL HIGH (ref <18)

## 2020-06-06 LAB — CBC
HCT: 30.4 % — ABNORMAL LOW (ref 39.0–52.0)
Hemoglobin: 9.8 g/dL — ABNORMAL LOW (ref 13.0–17.0)
MCH: 28.7 pg (ref 26.0–34.0)
MCHC: 32.2 g/dL (ref 30.0–36.0)
MCV: 88.9 fL (ref 80.0–100.0)
Platelets: 195 10*3/uL (ref 150–400)
RBC: 3.42 MIL/uL — ABNORMAL LOW (ref 4.22–5.81)
RDW: 14 % (ref 11.5–15.5)
WBC: 6.1 10*3/uL (ref 4.0–10.5)
nRBC: 0 % (ref 0.0–0.2)

## 2020-06-06 NOTE — ED Notes (Signed)
Pt left without seeing a provider.

## 2020-06-06 NOTE — ED Triage Notes (Signed)
Pt reports mild chest pain x1 week. Pt reports the pain is 5/10 and feeling dull.Pt denies any sob

## 2020-06-07 ENCOUNTER — Ambulatory Visit (INDEPENDENT_AMBULATORY_CARE_PROVIDER_SITE_OTHER): Payer: Medicare HMO | Admitting: Internal Medicine

## 2020-06-07 VITALS — BP 124/60 | HR 60 | Temp 98.1°F | Ht 74.0 in | Wt 277.2 lb

## 2020-06-07 DIAGNOSIS — N184 Chronic kidney disease, stage 4 (severe): Secondary | ICD-10-CM | POA: Diagnosis not present

## 2020-06-07 DIAGNOSIS — E538 Deficiency of other specified B group vitamins: Secondary | ICD-10-CM | POA: Diagnosis not present

## 2020-06-07 DIAGNOSIS — E785 Hyperlipidemia, unspecified: Secondary | ICD-10-CM | POA: Diagnosis not present

## 2020-06-07 DIAGNOSIS — R252 Cramp and spasm: Secondary | ICD-10-CM | POA: Diagnosis not present

## 2020-06-07 DIAGNOSIS — N185 Chronic kidney disease, stage 5: Secondary | ICD-10-CM | POA: Diagnosis not present

## 2020-06-07 DIAGNOSIS — N62 Hypertrophy of breast: Secondary | ICD-10-CM | POA: Diagnosis not present

## 2020-06-07 DIAGNOSIS — Z0000 Encounter for general adult medical examination without abnormal findings: Secondary | ICD-10-CM

## 2020-06-07 DIAGNOSIS — E559 Vitamin D deficiency, unspecified: Secondary | ICD-10-CM | POA: Diagnosis not present

## 2020-06-07 DIAGNOSIS — Z125 Encounter for screening for malignant neoplasm of prostate: Secondary | ICD-10-CM | POA: Diagnosis not present

## 2020-06-07 DIAGNOSIS — I10 Essential (primary) hypertension: Secondary | ICD-10-CM

## 2020-06-07 DIAGNOSIS — E1122 Type 2 diabetes mellitus with diabetic chronic kidney disease: Secondary | ICD-10-CM | POA: Diagnosis not present

## 2020-06-07 DIAGNOSIS — I700 Atherosclerosis of aorta: Secondary | ICD-10-CM | POA: Diagnosis not present

## 2020-06-07 DIAGNOSIS — N529 Male erectile dysfunction, unspecified: Secondary | ICD-10-CM

## 2020-06-07 DIAGNOSIS — Z0001 Encounter for general adult medical examination with abnormal findings: Secondary | ICD-10-CM

## 2020-06-07 DIAGNOSIS — I5189 Other ill-defined heart diseases: Secondary | ICD-10-CM

## 2020-06-07 DIAGNOSIS — D631 Anemia in chronic kidney disease: Secondary | ICD-10-CM

## 2020-06-07 LAB — PSA: PSA: 0.67 ng/mL (ref 0.10–4.00)

## 2020-06-07 LAB — LIPID PANEL
Cholesterol: 132 mg/dL (ref 0–200)
HDL: 30.4 mg/dL — ABNORMAL LOW (ref >39.00)
LDL Cholesterol: 72 mg/dL (ref 0–99)
NonHDL: 101.71
Total CHOL/HDL Ratio: 4
Triglycerides: 147 mg/dL (ref 0.0–149.0)
VLDL: 29.4 mg/dL (ref 0.0–40.0)

## 2020-06-07 LAB — MAGNESIUM: Magnesium: 1.8 mg/dL (ref 1.5–2.5)

## 2020-06-07 LAB — VITAMIN D 25 HYDROXY (VIT D DEFICIENCY, FRACTURES): VITD: 35.91 ng/mL (ref 30.00–100.00)

## 2020-06-07 LAB — VITAMIN B12: Vitamin B-12: 174 pg/mL — ABNORMAL LOW (ref 211–911)

## 2020-06-07 LAB — TESTOSTERONE: Testosterone: 411.83 ng/dL (ref 300.00–890.00)

## 2020-06-07 LAB — HEMOGLOBIN A1C: Hgb A1c MFr Bld: 5.5 % (ref 4.6–6.5)

## 2020-06-07 LAB — TSH: TSH: 0.5 u[IU]/mL (ref 0.35–4.50)

## 2020-06-07 NOTE — Patient Instructions (Signed)
Unfortunately, we are unable to give the viagra today due to the Imdur medication that you take  Please call if you change your mind about seeing the Endocrinology about the larger breasts  Please continue all other medications as before, and refills have been done if requested.  Please have the pharmacy call with any other refills you may need.  Please continue your efforts at being more active, low cholesterol diet, and weight control.  You are otherwise up to date with prevention measures today.  Please keep your appointments with your specialists as you may have planned - kidney doctor  Please go to the LAB at the blood drawing area for the tests to be done  You will be contacted by phone if any changes need to be made immediately.  Otherwise, you will receive a letter about your results with an explanation, but please check with MyChart first.  Please remember to sign up for MyChart if you have not done so, as this will be important to you in the future with finding out test results, communicating by private email, and scheduling acute appointments online when needed.  Please make an Appointment to return in 6 months, or sooner if needed

## 2020-06-07 NOTE — Progress Notes (Signed)
Patient ID: Craig Flynn, male   DOB: 06-29-1960, 60 y.o.   MRN: [id number]         Chief Complaint:: wellness exam and Follow-up  umbilical hernia, breast enlargement, right hand cramping, ED and DM       HPI:  Craig Flynn is a 60 y.o. male here for wellness exam;  Declines flu and Tdap shots, plans to have yearly eye exam later this yr, o/w up to date with preventive referrals and immunizations                Also  Pt denies polydipsia, polyuria.  But has recurring right hand cramping only, though he does not overuse the hand more than usual.  Also has an umbilical hernia mild more tender than previous and more frequently, but does not want general surgury referral for now.  Has worsening ED symptoms in the past 6 months, and asks for new viagra but already taking imdur.  Also has increased bilateral gynecomastia breast enlargment with sore tenderness worse at the costal margins over the past yr in retrospect, does not want endo referral but will check testosterone.  Has had recently controlled LDL cholesterol in the last 3 yrs with diet only, does not want statin.  Does not take b12 replacement, has been taking Vit D.     Wt Readings from Last 3 Encounters:  06/07/20 277 lb 3.2 oz (125.7 kg)  03/26/20 278 lb (126.1 kg)  03/18/20 278 lb (126.1 kg)   BP Readings from Last 3 Encounters:  06/07/20 124/60  06/06/20 137/64  03/26/20 (!) 121/59   Immunization History  Administered Date(s) Administered  . Influenza Whole 01/22/2008  . PFIZER(Purple Top)SARS-COV-2 Vaccination 06/24/2019, 07/18/2019, 05/02/2020  . Pneumococcal Polysaccharide-23 01/22/2008   There are no preventive care reminders to display for this patient.    Past Medical History:  Diagnosis Date  . Anemia 06/2015  . Arthritis    knee  . Cancer [facility])    thyroid cancer  . Cardiomyopathy ([place])    a. 10/2013: EF reduced to 35-40% b. 09/2014: EF improved to 55-60%, Grade 1 DD noted.  . Chest pain 06/2015  . CHF  (congestive heart failure) ([place])   . CKD (chronic kidney disease), stage IV [facility])    sees  Dr Lorrene Reid  . Dyspnea    with exerion   . GERD (gastroesophageal reflux disease)   . Gout   . Hypertension   . Obesity   . Sleep apnea    not wearing c-pap now, needs new slep study per pt. (01/22/2017)  . Tuberculosis 1981   while the pt was in the service.  . Tubular adenoma of colon 10/2015  . Wears glasses    Past Surgical History:  Procedure Laterality Date  . AV FISTULA PLACEMENT Left 03/25/2016   Procedure: Creation of Left Arm ARTERIOVENOUS (AV) FISTULA;  Surgeon: Elam Dutch, MD;  Location[facility];  Service: Vascular;  Laterality: Left;  . COLONOSCOPY W/ BIOPSIES AND POLYPECTOMY     "no problem"  . HERNIA REPAIR    . LAPAROSCOPIC CHOLECYSTECTOMY    . LIGATION OF COMPETING BRANCHES OF ARTERIOVENOUS FISTULA Left 01/25/2018   Procedure: LIGATION OF COMPETING BRANCHES And Revision of ARTERIOVENOUS FISTULA LEFT ARM.;  Surgeon: Elam Dutch, MD;  Location: [place];  Service: Vascular;  Laterality: Left;  . THYROIDECTOMY  01/22/2017  . THYROIDECTOMY Left 01/22/2017   Procedure: THYROIDECTOMY;  Surgeon: Melida Quitter, MD;  Location: [place];  Service:  ENT;  Laterality: Left;  . THYROIDECTOMY Right 03/26/2017   Procedure: COMPLETION OF THYROIDECTOMY;  Surgeon: Melida Quitter, MD;  Location: [place];  Service: ENT;  Laterality: Right;  . UMBILICAL HERNIA REPAIR    . WISDOM TOOTH EXTRACTION      reports that he has been smoking cigarettes. He has a 20.00 pack-year smoking history. He has never used smokeless tobacco. He reports previous alcohol use. He reports that he does not use drugs. family history includes Healthy in his maternal grandfather, paternal grandfather, and paternal grandmother; Hypertension in his maternal grandmother; Kidney disease in his maternal grandmother; Pneumonia in his father; Stroke in his mother. No Known Allergies Current Outpatient Medications on File Prior to Visit   Medication Sig Dispense Refill  . acetaminophen (TYLENOL) 500 MG tablet Take 2 tablets (1,000 mg total) by mouth every 8 (eight) hours as needed for mild pain or headache. 60 tablet 0  . albuterol (VENTOLIN HFA) 108 (90 Base) MCG/ACT inhaler Inhale 2 puffs into the lungs every 6 (six) hours as needed for wheezing or shortness of breath. 18 g 5  . allopurinol (ZYLOPRIM) 100 MG tablet Take 150 mg by mouth daily. Taking 1 & 1/2 tab= 150 mg    . aspirin EC 81 MG tablet Take 81 mg by mouth daily.    . calcitRIOL (ROCALTROL) 0.5 MCG capsule Take 0.5 mcg by mouth every Monday, Wednesday, and Friday.    . calcium elemental as carbonate (BARIATRIC TUMS ULTRA) 400 MG chewable tablet Chew 1,000 mg by mouth as needed for heartburn.     . carvedilol (COREG) 25 MG tablet TAKE 1 TABLET(25 MG) BY MOUTH TWICE DAILY 180 tablet 2  . cyclobenzaprine (FLEXERIL) 5 MG tablet Take 1 tablet (5 mg total) by mouth 3 (three) times daily as needed for muscle spasms. 30 tablet 1  . fenofibrate 54 MG tablet TAKE 1 TABLET BY MOUTH EVERY DAY (Patient taking differently: Take 54 mg by mouth daily.) 90 tablet 3  . furosemide (LASIX) 80 MG tablet Take 1.5 tablets (120 mg total) by mouth See admin instructions. Take 120 mg by mouth in the morning and 120 mg in the afternoon    . hydrALAZINE (APRESOLINE) 25 MG tablet Take 25 mg by mouth in the morning and at bedtime.    Marland Kitchen HYDROcodone-acetaminophen (NORCO/VICODIN) 5-325 MG tablet Take 1 tablet by mouth every 4 (four) hours as needed. 10 tablet 0  . iron polysaccharides (NIFEREX) 150 MG capsule Take 150 mg by mouth daily.    . isosorbide mononitrate (IMDUR) 30 MG 24 hr tablet TAKE 2 TABLETS(60 MG) BY MOUTH DAILY (Patient taking differently: Take 60 mg by mouth daily. TAKE 2 TABLETS(60 MG) BY MOUTH DAILY) 90 tablet 3  . levothyroxine (SYNTHROID) 100 MCG tablet Take 1 tablet (100 mcg total) by mouth daily. 90 tablet 0  . nitroGLYCERIN (NITROSTAT) 0.4 MG SL tablet DISSOLVE 1 TABLET UNDER  THE TONGUE EVERY 5 MINUTES AS NEEDED FOR CHEST PAIN (Patient taking differently: Place 0.4 mg under the tongue every 5 (five) minutes as needed for chest pain.) 25 tablet 3  . pantoprazole (PROTONIX) 40 MG tablet Take 1 tablet (40 mg total) by mouth 2 (two) times daily before a meal. 180 tablet 3  . Potassium Chloride ER 20 MEQ TBCR Take 20 mEq by mouth daily.    . sodium bicarbonate 650 MG tablet Take 1 tablet (650 mg total) by mouth 3 (three) times daily. 90 tablet 5  . thiamine (VITAMIN B-1) 50 MG  tablet Take 1 tablet (50 mg total) by mouth daily. 90 tablet 1   No current facility-administered medications on file prior to visit.        ROS:  All others reviewed and negative.  Objective        PE:  BP 124/60 (BP Location: Left Arm, Patient Position: Sitting, Cuff Size: Normal)   Pulse 60   Temp 98.1 F (36.7 C) (Oral)   Ht 6\' 2"  (1.88 m)   Wt 277 lb 3.2 oz (125.7 kg)   SpO2 98%   BMI 35.59 kg/m                 Constitutional: Pt appears in NAD               HENT: Head: NCAT.                Right Ear: External ear normal.                 Left Ear: External ear normal.                Eyes: . Pupils are equal, round, and reactive to light. Conjunctivae and EOM are normal               Nose: without d/c or deformity               Neck: Neck supple. Gross normal ROM               Cardiovascular: Normal rate and regular rhythm.                 Pulmonary/Chest: Effort normal and breath sounds without rales or wheezing.                Abd:  Soft, NT, ND, + BS, no organomegaly               Neurological: Pt is alert. At baseline orientation, motor grossly intact               Skin: Skin is warm. No rashes, no other new lesions, LE edema - none               Psychiatric: Pt behavior is normal without agitation   Micro: none  Cardiac tracings I have personally interpreted today:  none  Pertinent Radiological findings (summarize): none   Lab Results  Component Value Date   WBC 6.1  06/06/2020   HGB 9.8 (L) 06/06/2020   HCT 30.4 (L) 06/06/2020   PLT 195 06/06/2020   GLUCOSE 97 06/06/2020   CHOL 132 06/07/2020   TRIG 147.0 06/07/2020   HDL 30.40 (L) 06/07/2020   LDLCALC 72 06/07/2020   ALT 39 03/26/2020   AST 27 03/26/2020   NA 136 06/06/2020   K 4.2 06/06/2020   CL 96 (L) 06/06/2020   CREATININE 10.16 (H) 06/06/2020   BUN 72 (H) 06/06/2020   CO2 29 06/06/2020   TSH 0.50 06/07/2020   PSA 0.67 06/07/2020   HGBA1C 5.5 06/07/2020   Assessment/Plan:  Craig Flynn is a 60 y.o. Black or African American [2] male with  has a past medical history of Anemia (06/2015), Arthritis, Cancer ([place]), Cardiomyopathy ([place]), Chest pain (06/2015), CHF (congestive heart failure) ([place]), CKD (chronic kidney disease), stage IV ([place]), Dyspnea, GERD (gastroesophageal reflux disease), Gout, Hypertension, Obesity, Sleep apnea, Tuberculosis (1981), Tubular adenoma of colon (10/2015), and Wears glasses.  CKD (chronic kidney disease), stage V ([place]) Not  yet started on dialysis, no inidcation at this time, pt to cont to f/u renal, maybe consider discussion about epo  Anemia in chronic kidney disease I suggested to pt to bring up epo tx with renal at next f/u visit Lab Results  Component Value Date   WBC 6.1 06/06/2020   HGB 9.8 (L) 06/06/2020   HCT 30.4 (L) 06/06/2020   MCV 88.9 06/06/2020   PLT 195 06/06/2020    Erectile dysfunction Unable for viagra due to imdur use  Hand cramp Right hand intermittent,  Check Mg, to f/u any worsening symptoms or concerns  B12 deficiency Lab Results  Component Value Date   VITAMINB12 174 (L) 06/07/2020   Low , to start oral replacement - b12 1000 mcg qd  Vitamin D deficiency Last vitamin D Lab Results  Component Value Date   VD25OH 35.91 06/07/2020   Stable, cont oral replacement   Encounter for well adult exam with abnormal findings Age and sex appropriate education and counseling updated with regular exercise and diet Referrals for  preventative services - none needed, plans to call for eye exam later this yr Immunizations addressed - declines flu and tdap shots Smoking counseling  - counseled to quit Evidence for depression or other mood disorder - none significant Most recent labs reviewed. I have personally reviewed and have noted: 1) the patient's medical and social history 2) The patient's current medications and supplements 3) The patient's height, weight, and BMI have been recorded in the chart   Aortic atherosclerosis ([place]) With controlled LDL chol with diet only for about 3 yrs, declines statin  Diabetes mellitus ([place]) Lab Results  Component Value Date   HGBA1C 5.5 06/07/2020   Stable, pt to continue current medical treatment  - diet and wt control   Gynecomastia, male With 1 yr increased chest sore tenderness - for testosterone, consider endo referral but declnes for now  HTN (hypertension) BP Readings from Last 3 Encounters:  06/07/20 124/60  06/06/20 137/64  03/26/20 (!) 121/59   Stable, pt to continue medical treatment - coreg, hydralazine   Hyperlipidemia LDL goal <130 Lab Results  Component Value Date   LDLCALC 72 06/07/2020   Stable, pt to continue current fenofibrate, low chol diet   Followup: Return in about 6 months (around 12/08/2020).  Cathlean Cower, MD 06/08/2020 11:37 PM [facility]

## 2020-06-07 NOTE — Assessment & Plan Note (Signed)
Unable for viagra due to imdur use

## 2020-06-07 NOTE — Assessment & Plan Note (Addendum)
Not yet started on dialysis, no inidcation at this time, pt to cont to f/u renal, maybe consider discussion about epo

## 2020-06-07 NOTE — Assessment & Plan Note (Addendum)
I suggested to pt to bring up epo tx with renal at next f/u visit Lab Results  Component Value Date   WBC 6.1 06/06/2020   HGB 9.8 (L) 06/06/2020   HCT 30.4 (L) 06/06/2020   MCV 88.9 06/06/2020   PLT 195 06/06/2020

## 2020-06-07 NOTE — Assessment & Plan Note (Signed)
Right hand intermittent,  Check Mg, to f/u any worsening symptoms or concerns

## 2020-06-08 ENCOUNTER — Encounter: Payer: Self-pay | Admitting: Internal Medicine

## 2020-06-08 DIAGNOSIS — E538 Deficiency of other specified B group vitamins: Secondary | ICD-10-CM | POA: Insufficient documentation

## 2020-06-08 NOTE — Assessment & Plan Note (Signed)
BP Readings from Last 3 Encounters:  06/07/20 124/60  06/06/20 137/64  03/26/20 (!) 121/59   Stable, pt to continue medical treatment - coreg, hydralazine

## 2020-06-08 NOTE — Assessment & Plan Note (Signed)
Lab Results  Component Value Date   VITAMINB12 174 (L) 06/07/2020   Low , to start oral replacement - b12 1000 mcg qd

## 2020-06-08 NOTE — Assessment & Plan Note (Signed)
Lab Results  Component Value Date   LDLCALC 72 06/07/2020   Stable, pt to continue current fenofibrate, low chol diet

## 2020-06-08 NOTE — Assessment & Plan Note (Signed)
Age and sex appropriate education and counseling updated with regular exercise and diet Referrals for preventative services - none needed, plans to call for eye exam later this yr Immunizations addressed - declines flu and tdap shots Smoking counseling  - counseled to quit Evidence for depression or other mood disorder - none significant Most recent labs reviewed. I have personally reviewed and have noted: 1) the patient's medical and social history 2) The patient's current medications and supplements 3) The patient's height, weight, and BMI have been recorded in the chart

## 2020-06-08 NOTE — Assessment & Plan Note (Signed)
With 1 yr increased chest sore tenderness - for testosterone, consider endo referral but declnes for now

## 2020-06-08 NOTE — Assessment & Plan Note (Signed)
With controlled LDL chol with diet only for about 3 yrs, declines statin

## 2020-06-08 NOTE — Assessment & Plan Note (Signed)
Lab Results  Component Value Date   HGBA1C 5.5 06/07/2020   Stable, pt to continue current medical treatment  - diet and wt control

## 2020-06-08 NOTE — Assessment & Plan Note (Signed)
Last vitamin D Lab Results  Component Value Date   VD25OH 35.91 06/07/2020   Stable, cont oral replacement

## 2020-06-12 DIAGNOSIS — N185 Chronic kidney disease, stage 5: Secondary | ICD-10-CM | POA: Diagnosis not present

## 2020-06-12 DIAGNOSIS — I770 Arteriovenous fistula, acquired: Secondary | ICD-10-CM | POA: Diagnosis not present

## 2020-06-12 DIAGNOSIS — N2581 Secondary hyperparathyroidism of renal origin: Secondary | ICD-10-CM | POA: Diagnosis not present

## 2020-06-12 DIAGNOSIS — D631 Anemia in chronic kidney disease: Secondary | ICD-10-CM | POA: Diagnosis not present

## 2020-06-12 DIAGNOSIS — I120 Hypertensive chronic kidney disease with stage 5 chronic kidney disease or end stage renal disease: Secondary | ICD-10-CM | POA: Diagnosis not present

## 2020-06-12 DIAGNOSIS — E872 Acidosis: Secondary | ICD-10-CM | POA: Diagnosis not present

## 2020-06-26 ENCOUNTER — Other Ambulatory Visit: Payer: Self-pay | Admitting: Internal Medicine

## 2020-06-26 DIAGNOSIS — E890 Postprocedural hypothyroidism: Secondary | ICD-10-CM

## 2020-07-24 NOTE — Progress Notes (Signed)
Subjective:    Patient ID: Craig Flynn, male    DOB: 1960-04-05, 60 y.o.   MRN: [id number]  HPI The patient is here for an acute visit.   ? Vertigo:    Medications and allergies reviewed with patient and updated if appropriate.  Patient Active Problem List   Diagnosis Date Noted  . B12 deficiency 06/08/2020  . Gynecomastia, male 06/07/2020  . Erectile dysfunction 06/07/2020  . Hand cramp 06/07/2020  . Aortic atherosclerosis ([place]) 06/07/2020  . Anemia due to acquired thiamine deficiency 03/25/2020  . Umbilical hernia without obstruction and without gangrene 03/18/2020  . 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 Deficiency anemia 03/18/2020  . Hyperlipidemia LDL goal <130 03/18/2020  . Upper back pain on left side 02/06/2020  . Sinusitis 10/16/2019  . Allergic rhinitis 10/16/2019  . CAD (coronary artery disease) 09/07/2019  . Nephrosclerosis 09/07/2019  . Obesity (BMI 30-39.9) 09/07/2019  . Secondary hyperparathyroidism ([place]) 09/07/2019  . CKD (chronic kidney disease), stage V ([place]) 06/22/2019  . Obesity, diabetes, and hypertension syndrome ([place]) 06/22/2019  . Ulnar neuritis, right 04/25/2019  . Muscle cramps 02/26/2019  . Abnormal TSH 02/26/2019  . Vitamin D deficiency 02/22/2019  . Iron deficiency 02/22/2019  . Chest pain 11/01/2018  . Laryngopharyngeal reflux (LPR) 10/04/2018  . Post-nasal drainage 10/04/2018  . Whiplash injuries, initial encounter 09/21/2018  . Degenerative arthritis of right knee 09/21/2018  . Chronic diastolic HF (heart failure) ([place]) 07/04/2018  . Intractable post-traumatic headache 06/16/2018  . Low back pain 06/15/2018  . Neck pain 06/15/2018  . 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 Thyroid nodule 01/22/2017  . Ptosis 01/09/2016  . Chronic systolic heart failure ([place]) 08/01/2015  . GERD (gastroesophageal reflux disease) 07/29/2015  . Osteoarthritis 05/03/2015  . Hyperlipidemia 02/26/2015  . Diastolic dysfunction 79/89/2119   . Tobacco abuse 02/26/2015  . OSA (obstructive sleep apnea) 12/27/2014  . Encounter for well adult exam with abnormal findings 11/12/2014  . Anemia in chronic kidney disease 11/12/2014  . Left knee pain 09/22/2014  . Paresthesia of left leg 09/21/2014  . Acute systolic CHF (congestive heart failure) ([place]) 11/17/2013  . Morbid obesity ([place]) 11/17/2013  . Diabetes mellitus ([place]) 12/12/2011  . Acute on chronic renal failure ([place]) 12/11/2011  . HTN (hypertension) 12/11/2011  . SOB (shortness of breath) 12/11/2011  . Gout 12/31/2006  . HERNIATED LUMBAR DISK WITH RADICULOPATHY 12/30/2006    Current Outpatient Medications on File Prior to Visit  Medication Sig Dispense Refill  . acetaminophen (TYLENOL) 500 MG tablet Take 2 tablets (1,000 mg total) by mouth every 8 (eight) hours as needed for mild pain or headache. 60 tablet 0  . albuterol (VENTOLIN HFA) 108 (90 Base) MCG/ACT inhaler Inhale 2 puffs into the lungs every 6 (six) hours as needed for wheezing or shortness of breath. 18 g 5  . allopurinol (ZYLOPRIM) 100 MG tablet Take 150 mg by mouth daily. Taking 1 & 1/2 tab= 150 mg    . aspirin EC 81 MG tablet Take 81 mg by mouth daily.    . calcitRIOL (ROCALTROL) 0.5 MCG capsule Take 0.5 mcg by mouth every Monday, Wednesday, and Friday.    . calcium elemental as carbonate (BARIATRIC TUMS ULTRA) 400 MG chewable tablet Chew 1,000 mg by mouth as needed for heartburn.     . carvedilol (COREG) 25 MG tablet TAKE 1 TABLET(25 MG) BY MOUTH TWICE DAILY 180 tablet 2  . cyclobenzaprine (FLEXERIL) 5 MG tablet Take 1 tablet (5 mg total) by mouth  3 (three) times daily as needed for muscle spasms. 30 tablet 1  . fenofibrate 54 MG tablet TAKE 1 TABLET BY MOUTH EVERY DAY (Patient taking differently: Take 54 mg by mouth daily.) 90 tablet 3  . furosemide (LASIX) 80 MG tablet Take 1.5 tablets (120 mg total) by mouth See admin instructions. Take 120 mg by mouth in the morning and 120 mg in the afternoon    .  hydrALAZINE (APRESOLINE) 25 MG tablet Take 25 mg by mouth in the morning and at bedtime.    Marland Kitchen HYDROcodone-acetaminophen (NORCO/VICODIN) 5-325 MG tablet Take 1 tablet by mouth every 4 (four) hours as needed. 10 tablet 0  . iron polysaccharides (NIFEREX) 150 MG capsule Take 150 mg by mouth daily.    . isosorbide mononitrate (IMDUR) 30 MG 24 hr tablet TAKE 2 TABLETS(60 MG) BY MOUTH DAILY (Patient taking differently: Take 60 mg by mouth daily. TAKE 2 TABLETS(60 MG) BY MOUTH DAILY) 90 tablet 3  . levothyroxine (SYNTHROID) 100 MCG tablet Take 1 tablet (100 mcg total) by mouth daily. 90 tablet 0  . nitroGLYCERIN (NITROSTAT) 0.4 MG SL tablet DISSOLVE 1 TABLET UNDER THE TONGUE EVERY 5 MINUTES AS NEEDED FOR CHEST PAIN (Patient taking differently: Place 0.4 mg under the tongue every 5 (five) minutes as needed for chest pain.) 25 tablet 3  . pantoprazole (PROTONIX) 40 MG tablet Take 1 tablet (40 mg total) by mouth 2 (two) times daily before a meal. 180 tablet 3  . Potassium Chloride ER 20 MEQ TBCR Take 20 mEq by mouth daily.    . sodium bicarbonate 650 MG tablet Take 1 tablet (650 mg total) by mouth 3 (three) times daily. 90 tablet 5  . thiamine (VITAMIN B-1) 50 MG tablet Take 1 tablet (50 mg total) by mouth daily. 90 tablet 1   No current facility-administered medications on file prior to visit.    Past Medical History:  Diagnosis Date  . Anemia 06/2015  . Arthritis    knee  . Cancer [facility])    thyroid cancer  . Cardiomyopathy ([place])    a. 10/2013: EF reduced to 35-40% b. 09/2014: EF improved to 55-60%, Grade 1 DD noted.  . Chest pain 06/2015  . CHF (congestive heart failure) ([place])   . CKD (chronic kidney disease), stage IV [facility])    sees  Dr Lorrene Reid  . Dyspnea    with exerion   . GERD (gastroesophageal reflux disease)   . Gout   . Hypertension   . Obesity   . Sleep apnea    not wearing c-pap now, needs new slep study per pt. (01/22/2017)  . Tuberculosis 1981   while the pt was in the service.  .  Tubular adenoma of colon 10/2015  . Wears glasses     Past Surgical History:  Procedure Laterality Date  . AV FISTULA PLACEMENT Left 03/25/2016   Procedure: Creation of Left Arm ARTERIOVENOUS (AV) FISTULA;  Surgeon: Elam Dutch, MD;  Location: [facility] OR;  Service: Vascular;  Laterality: Left;  . COLONOSCOPY W/ BIOPSIES AND POLYPECTOMY     "no problem"  . HERNIA REPAIR    . LAPAROSCOPIC CHOLECYSTECTOMY    . LIGATION OF COMPETING BRANCHES OF ARTERIOVENOUS FISTULA Left 01/25/2018   Procedure: LIGATION OF COMPETING BRANCHES And Revision of ARTERIOVENOUS FISTULA LEFT ARM.;  Surgeon: Elam Dutch, MD;  Location: [place];  Service: Vascular;  Laterality: Left;  . THYROIDECTOMY  01/22/2017  . THYROIDECTOMY Left 01/22/2017   Procedure: THYROIDECTOMY;  Surgeon: Redmond Baseman,  Orpah Greek, MD;  Location: [place];  Service: ENT;  Laterality: Left;  . THYROIDECTOMY Right 03/26/2017   Procedure: COMPLETION OF THYROIDECTOMY;  Surgeon: Melida Quitter, MD;  Location: [place];  Service: ENT;  Laterality: Right;  . UMBILICAL HERNIA REPAIR    . WISDOM TOOTH EXTRACTION      Social History   Socioeconomic History  . Marital status: Divorced    Spouse name: Not on file  . Number of children: 2  . Years of education: 15  . Highest education level: Not on file  Occupational History  . Occupation: Disability    Comment: Knees   Tobacco Use  . Smoking status: Current Every Day Smoker    Packs/day: 0.50    Years: 40.00    Pack years: 20.00    Types: Cigarettes  . Smokeless tobacco: Never Used  Vaping Use  . Vaping Use: Never used  Substance and Sexual Activity  . Alcohol use: Not Currently    Alcohol/week: 0.0 standard drinks  . Drug use: No  . Sexual activity: Yes  Other Topics Concern  . Not on file  Social History Narrative   Fun: Golf, basketball    Denies religious beliefs effecting health care.    Social Determinants of Health   Financial Resource Strain: Low Risk   . Difficulty of Paying Living  Expenses: Not hard at all  Food Insecurity: No Food Insecurity  . Worried About Charity fundraiser in the Last Year: Never true  . Ran Out of Food in the Last Year: Never true  Transportation Needs: No Transportation Needs  . Lack of Transportation (Medical): No  . Lack of Transportation (Non-Medical): No  Physical Activity: Sufficiently Active  . Days of Exercise per Week: 5 days  . Minutes of Exercise per Session: 30 min  Stress: No Stress Concern Present  . Feeling of Stress : Not at all  Social Connections: Moderately Integrated  . Frequency of Communication with Friends and Family: More than three times a week  . Frequency of Social Gatherings with Friends and Family: More than three times a week  . Attends Religious Services: More than 4 times per year  . Active Member of Clubs or Organizations: Yes  . Attends Archivist Meetings: More than 4 times per year  . Marital Status: Never married    Family History  Problem Relation Age of Onset  . Stroke Mother   . Pneumonia Father        died of Pneumonia x3  . Kidney disease Maternal Grandmother   . Hypertension Maternal Grandmother   . Healthy Maternal Grandfather   . Healthy Paternal Grandmother   . Healthy Paternal Grandfather   . CAD Neg Hx   . Colon cancer Neg Hx   . Esophageal cancer Neg Hx   . Pancreatic cancer Neg Hx   . Prostate cancer Neg Hx   . Stomach cancer Neg Hx   . Rectal cancer Neg Hx     Review of Systems     Objective:  There were no vitals filed for this visit. BP Readings from Last 3 Encounters:  06/07/20 124/60  06/06/20 137/64  03/26/20 (!) 121/59   Wt Readings from Last 3 Encounters:  06/07/20 277 lb 3.2 oz (125.7 kg)  03/26/20 278 lb (126.1 kg)  03/18/20 278 lb (126.1 kg)   There is no height or weight on file to calculate BMI.   Physical Exam  Assessment & Plan:    See Problem List for Assessment and Plan of chronic medical problems.    This visit  occurred during the SARS-CoV-2 public health emergency.  Safety protocols were in place, including screening questions prior to the visit, additional usage of staff PPE, and extensive cleaning of exam room while observing appropriate contact time as indicated for disinfecting solutions.   This encounter was created in error - please disregard.

## 2020-07-25 ENCOUNTER — Encounter: Payer: Medicare HMO | Admitting: Internal Medicine

## 2020-07-31 ENCOUNTER — Emergency Department (HOSPITAL_COMMUNITY): Payer: Medicare HMO

## 2020-07-31 ENCOUNTER — Other Ambulatory Visit: Payer: Self-pay | Admitting: Internal Medicine

## 2020-07-31 ENCOUNTER — Emergency Department (HOSPITAL_COMMUNITY)
Admission: EM | Admit: 2020-07-31 | Discharge: 2020-07-31 | Disposition: A | Discharge status: Home / Self Care | Payer: Medicare HMO | Attending: Emergency Medicine | Admitting: Emergency Medicine

## 2020-07-31 ENCOUNTER — Encounter (HOSPITAL_COMMUNITY): Payer: Self-pay

## 2020-07-31 ENCOUNTER — Other Ambulatory Visit: Payer: Self-pay

## 2020-07-31 DIAGNOSIS — E1122 Type 2 diabetes mellitus with diabetic chronic kidney disease: Secondary | ICD-10-CM | POA: Diagnosis not present

## 2020-07-31 DIAGNOSIS — R079 Chest pain, unspecified: Secondary | ICD-10-CM | POA: Diagnosis not present

## 2020-07-31 DIAGNOSIS — M25512 Pain in left shoulder: Secondary | ICD-10-CM | POA: Insufficient documentation

## 2020-07-31 DIAGNOSIS — I5023 Acute on chronic systolic (congestive) heart failure: Secondary | ICD-10-CM | POA: Diagnosis not present

## 2020-07-31 DIAGNOSIS — F17210 Nicotine dependence, cigarettes, uncomplicated: Secondary | ICD-10-CM | POA: Insufficient documentation

## 2020-07-31 DIAGNOSIS — Z79899 Other long term (current) drug therapy: Secondary | ICD-10-CM | POA: Insufficient documentation

## 2020-07-31 DIAGNOSIS — M546 Pain in thoracic spine: Secondary | ICD-10-CM | POA: Diagnosis present

## 2020-07-31 DIAGNOSIS — I2510 Atherosclerotic heart disease of native coronary artery without angina pectoris: Secondary | ICD-10-CM | POA: Diagnosis not present

## 2020-07-31 DIAGNOSIS — N184 Chronic kidney disease, stage 4 (severe): Secondary | ICD-10-CM | POA: Insufficient documentation

## 2020-07-31 DIAGNOSIS — M62838 Other muscle spasm: Secondary | ICD-10-CM | POA: Diagnosis not present

## 2020-07-31 DIAGNOSIS — I130 Hypertensive heart and chronic kidney disease with heart failure and stage 1 through stage 4 chronic kidney disease, or unspecified chronic kidney disease: Secondary | ICD-10-CM | POA: Diagnosis not present

## 2020-07-31 DIAGNOSIS — I10 Essential (primary) hypertension: Secondary | ICD-10-CM | POA: Diagnosis not present

## 2020-07-31 DIAGNOSIS — Z7982 Long term (current) use of aspirin: Secondary | ICD-10-CM | POA: Diagnosis not present

## 2020-07-31 DIAGNOSIS — Z85850 Personal history of malignant neoplasm of thyroid: Secondary | ICD-10-CM | POA: Insufficient documentation

## 2020-07-31 LAB — BASIC METABOLIC PANEL
Anion gap: 12 (ref 5–15)
BUN: 108 mg/dL — ABNORMAL HIGH (ref 6–20)
CO2: 21 mmol/L — ABNORMAL LOW (ref 22–32)
Calcium: 11.4 mg/dL — ABNORMAL HIGH (ref 8.9–10.3)
Chloride: 94 mmol/L — ABNORMAL LOW (ref 98–111)
Creatinine, Ser: 11.07 mg/dL — ABNORMAL HIGH (ref 0.61–1.24)
GFR, Estimated: 5 mL/min — ABNORMAL LOW (ref >60)
Glucose, Bld: 99 mg/dL (ref 70–99)
Potassium: 3.5 mmol/L (ref 3.5–5.1)
Sodium: 127 mmol/L — ABNORMAL LOW (ref 135–145)

## 2020-07-31 LAB — CBC
HCT: 30 % — ABNORMAL LOW (ref 39.0–52.0)
Hemoglobin: 9.7 g/dL — ABNORMAL LOW (ref 13.0–17.0)
MCH: 27.5 pg (ref 26.0–34.0)
MCHC: 32.3 g/dL (ref 30.0–36.0)
MCV: 85 fL (ref 80.0–100.0)
Platelets: 200 10*3/uL (ref 150–400)
RBC: 3.53 MIL/uL — ABNORMAL LOW (ref 4.22–5.81)
RDW: 12.4 % (ref 11.5–15.5)
WBC: 6.8 10*3/uL (ref 4.0–10.5)
nRBC: 0 % (ref 0.0–0.2)

## 2020-07-31 LAB — TROPONIN I (HIGH SENSITIVITY): Troponin I (High Sensitivity): 32 ng/L — ABNORMAL HIGH (ref <18)

## 2020-07-31 MED ORDER — CYCLOBENZAPRINE HCL 10 MG PO TABS: 10.0000 mg | ORAL_TABLET | Freq: Two times a day (BID) | ORAL | 0 refills | Status: DC | PRN

## 2020-07-31 NOTE — ED Provider Notes (Signed)
[facility] Provider Note   CSN: [id number] Arrival date & time: 07/31/20  0401     History Chief Complaint  Patient presents with  . Shortness of Breath  . Chest Pain    Craig Flynn is a 60 y.o. male.  The history is provided by the patient.  Back Pain Pain location: left upper back. Quality: sharp. Radiates to:  Does not radiate Pain severity:  Moderate Onset quality:  Sudden Timing:  Intermittent Progression:  Improving Chronicity:  New Relieved by:  OTC medications Worsened by:  Deep breathing (Position) Associated symptoms: no abdominal pain, no bladder incontinence, no fever, no tingling and no weakness   Patient with history of chronic kidney disease not on dialysis, cardiomyopathy presents with back pain.  Patient reports he was resting and lying down, when he sat up he had immediate pain in his left upper back and felt a "pop " Since that time the pain is worse with movement and breathing.  He reports he feels some shortness of breath because it hurts to breathe no significant anterior chest pain is reported He reports he is unable to lay on that side. No recent falls or trauma No new leg weakness.  No incontinence.    Past Medical History:  Diagnosis Date  . Anemia 06/2015  . Arthritis    knee  . Cancer [facility])    thyroid cancer  . Cardiomyopathy ([place])    a. 10/2013: EF reduced to 35-40% b. 09/2014: EF improved to 55-60%, Grade 1 DD noted.  . Chest pain 06/2015  . CHF (congestive heart failure) ([place])   . CKD (chronic kidney disease), stage IV [facility])    sees  Dr Lorrene Reid  . Dyspnea    with exerion   . GERD (gastroesophageal reflux disease)   . Gout   . Hypertension   . Obesity   . Sleep apnea    not wearing c-pap now, needs new slep study per pt. (01/22/2017)  . Tuberculosis 1981   while the pt was in the service.  . Tubular adenoma of colon 10/2015  . Wears glasses     Patient Active Problem List   Diagnosis  Date Noted  . B12 deficiency 06/08/2020  . Gynecomastia, male 06/07/2020  . Erectile dysfunction 06/07/2020  . Hand cramp 06/07/2020  . Aortic atherosclerosis ([place]) 06/07/2020  . Anemia due to acquired thiamine deficiency 03/25/2020  . Umbilical hernia without obstruction and without gangrene 03/18/2020  . 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 Deficiency anemia 03/18/2020  . Hyperlipidemia LDL goal <130 03/18/2020  . Upper back pain on left side 02/06/2020  . Sinusitis 10/16/2019  . Allergic rhinitis 10/16/2019  . CAD (coronary artery disease) 09/07/2019  . Nephrosclerosis 09/07/2019  . Obesity (BMI 30-39.9) 09/07/2019  . Secondary hyperparathyroidism ([place]) 09/07/2019  . CKD (chronic kidney disease), stage V ([place]) 06/22/2019  . Obesity, diabetes, and hypertension syndrome ([place]) 06/22/2019  . Ulnar neuritis, right 04/25/2019  . Muscle cramps 02/26/2019  . Abnormal TSH 02/26/2019  . Vitamin D deficiency 02/22/2019  . Iron deficiency 02/22/2019  . Chest pain 11/01/2018  . Laryngopharyngeal reflux (LPR) 10/04/2018  . Post-nasal drainage 10/04/2018  . Whiplash injuries, initial encounter 09/21/2018  . Degenerative arthritis of right knee 09/21/2018  . Chronic diastolic HF (heart failure) ([place]) 07/04/2018  . Intractable post-traumatic headache 06/16/2018  . Low back pain 06/15/2018  . Neck pain 06/15/2018  . 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 Thyroid nodule 01/22/2017  .  Ptosis 01/09/2016  . Chronic systolic heart failure ([place]) 08/01/2015  . GERD (gastroesophageal reflux disease) 07/29/2015  . Osteoarthritis 05/03/2015  . Hyperlipidemia 02/26/2015  . Diastolic dysfunction 01/17/2535  . Tobacco abuse 02/26/2015  . OSA (obstructive sleep apnea) 12/27/2014  . Encounter for well adult exam with abnormal findings 11/12/2014  . Anemia in chronic kidney disease 11/12/2014  . Left knee pain 09/22/2014  . Paresthesia of left leg 09/21/2014  . Acute systolic CHF  (congestive heart failure) ([place]) 11/17/2013  . Morbid obesity ([place]) 11/17/2013  . Diabetes mellitus ([place]) 12/12/2011  . Acute on chronic renal failure ([place]) 12/11/2011  . HTN (hypertension) 12/11/2011  . SOB (shortness of breath) 12/11/2011  . Gout 12/31/2006  . HERNIATED LUMBAR DISK WITH RADICULOPATHY 12/30/2006    Past Surgical History:  Procedure Laterality Date  . AV FISTULA PLACEMENT Left 03/25/2016   Procedure: Creation of Left Arm ARTERIOVENOUS (AV) FISTULA;  Surgeon: Elam Dutch, MD;  Location: [facility] OR;  Service: Vascular;  Laterality: Left;  . COLONOSCOPY W/ BIOPSIES AND POLYPECTOMY     "no problem"  . HERNIA REPAIR    . LAPAROSCOPIC CHOLECYSTECTOMY    . LIGATION OF COMPETING BRANCHES OF ARTERIOVENOUS FISTULA Left 01/25/2018   Procedure: LIGATION OF COMPETING BRANCHES And Revision of ARTERIOVENOUS FISTULA LEFT ARM.;  Surgeon: Elam Dutch, MD;  Location: [facility];  Service: Vascular;  Laterality: Left;  . THYROIDECTOMY  01/22/2017  . THYROIDECTOMY Left 01/22/2017   Procedure: THYROIDECTOMY;  Surgeon: Melida Quitter, MD;  Location: [facility];  Service: ENT;  Laterality: Left;  . THYROIDECTOMY Right 03/26/2017   Procedure: COMPLETION OF THYROIDECTOMY;  Surgeon: Melida Quitter, MD;  Location: [facility];  Service: ENT;  Laterality: Right;  . UMBILICAL HERNIA REPAIR    . WISDOM TOOTH EXTRACTION         Family History  Problem Relation Age of Onset  . Stroke Mother   . Pneumonia Father        died of Pneumonia x3  . Kidney disease Maternal Grandmother   . Hypertension Maternal Grandmother   . Healthy Maternal Grandfather   . Healthy Paternal Grandmother   . Healthy Paternal Grandfather   . CAD Neg Hx   . Colon cancer Neg Hx   . Esophageal cancer Neg Hx   . Pancreatic cancer Neg Hx   . Prostate cancer Neg Hx   . Stomach cancer Neg Hx   . Rectal cancer Neg Hx     Social History   Tobacco Use  . Smoking status: Current Every Day Smoker    Packs/day: 0.50    Years: 40.00     Pack years: 20.00    Types: Cigarettes  . Smokeless tobacco: Never Used  Vaping Use  . Vaping Use: Never used  Substance Use Topics  . Alcohol use: Not Currently    Alcohol/week: 0.0 standard drinks  . Drug use: No    Home Medications Prior to Admission medications   Medication Sig Start Date End Date Taking? Authorizing Provider  acetaminophen (TYLENOL) 500 MG tablet Take 2 tablets (1,000 mg total) by mouth every 8 (eight) hours as needed for mild pain or headache. 05/05/18   Lance Sell, NP  albuterol (VENTOLIN HFA) 108 (90 Base) MCG/ACT inhaler Inhale 2 puffs into the lungs every 6 (six) hours as needed for wheezing or shortness of breath. 05/24/19   Biagio Borg, MD  allopurinol (ZYLOPRIM) 100 MG tablet Take 150 mg by mouth daily. Taking 1 & 1/2 tab=  150 mg 10/27/18   [provider]  aspirin EC 81 MG tablet Take 81 mg by mouth daily.    [provider]  calcitRIOL (ROCALTROL) 0.5 MCG capsule Take 0.5 mcg by mouth every Monday, Wednesday, and Friday. 12/10/18   [provider]  calcium elemental as carbonate (BARIATRIC TUMS ULTRA) 400 MG chewable tablet Chew 1,000 mg by mouth as needed for heartburn.     [provider]  carvedilol (COREG) 25 MG tablet TAKE 1 TABLET(25 MG) BY MOUTH TWICE DAILY 03/20/20   Biagio Borg, MD  cyclobenzaprine (FLEXERIL) 5 MG tablet Take 1 tablet (5 mg total) by mouth 3 (three) times daily as needed for muscle spasms. 02/06/20   Biagio Borg, MD  fenofibrate 54 MG tablet TAKE 1 TABLET BY MOUTH EVERY DAY Patient taking differently: Take 54 mg by mouth daily. 08/16/19   Minus Breeding, MD  furosemide (LASIX) 80 MG tablet Take 1.5 tablets (120 mg total) by mouth See admin instructions. Take 120 mg by mouth in the morning and 120 mg in the afternoon 03/26/20   Isla Pence, MD  hydrALAZINE (APRESOLINE) 25 MG tablet Take 25 mg by mouth in the morning and at bedtime. 07/31/19   [provider]   HYDROcodone-acetaminophen (NORCO/VICODIN) 5-325 MG tablet Take 1 tablet by mouth every 4 (four) hours as needed. 03/26/20   Isla Pence, MD  iron polysaccharides (NIFEREX) 150 MG capsule Take 150 mg by mouth daily. 12/18/16   [provider]  isosorbide mononitrate (IMDUR) 30 MG 24 hr tablet TAKE 2 TABLETS(60 MG) BY MOUTH DAILY Patient taking differently: Take 60 mg by mouth daily. TAKE 2 TABLETS(60 MG) BY MOUTH DAILY 01/15/20   Minus Breeding, MD  levothyroxine (SYNTHROID) 100 MCG tablet Take 1 tablet (100 mcg total) by mouth daily. 03/24/20   Janith Lima, MD  nitroGLYCERIN (NITROSTAT) 0.4 MG SL tablet DISSOLVE 1 TABLET UNDER THE TONGUE EVERY 5 MINUTES AS NEEDED FOR CHEST PAIN Patient taking differently: Place 0.4 mg under the tongue every 5 (five) minutes as needed for chest pain. 03/29/19   Tami Lin, MD  pantoprazole (PROTONIX) 40 MG tablet Take 1 tablet (40 mg total) by mouth 2 (two) times daily before a meal. 11/29/19   Biagio Borg, MD  Potassium Chloride ER 20 MEQ TBCR Take 20 mEq by mouth daily. 08/16/19   [provider]  sodium bicarbonate 650 MG tablet Take 1 tablet (650 mg total) by mouth 3 (three) times daily. 05/26/19   Biagio Borg, MD  thiamine (VITAMIN B-1) 50 MG tablet Take 1 tablet (50 mg total) by mouth daily. 03/25/20   Janith Lima, MD    Allergies    Patient has no known allergies.  Review of Systems   Review of Systems  Constitutional: Negative for fever.  Respiratory: Positive for shortness of breath.   Gastrointestinal: Negative for abdominal pain.  Genitourinary: Negative for bladder incontinence.  Musculoskeletal: Positive for back pain.  Neurological: Negative for tingling and weakness.  All other systems reviewed and are negative.   Physical Exam Updated Vital Signs BP 131/70 (BP Location: Right Arm)   Pulse 66   Temp 98.3 F (36.8 C) (Oral)   Resp 16   SpO2 100%   Physical Exam CONSTITUTIONAL: Well developed/well  nourished, no acute distress HEAD: Normocephalic/atraumatic EYES: EOMI/PERRL ENMT: Mucous membranes moist NECK: supple no meningeal signs SPINE/BACK:entire spine nontender , tenderness over the left scapular region.  No redness or rash noted.  No bruising or crepitus. CV: S1/S2 noted, no murmurs/rubs/gallops noted LUNGS: Lungs are clear to auscultation bilaterally, no apparent distress ABDOMEN: soft, nontender, no rebound or guarding GU:no cva tenderness NEURO: Awake/alert, equal motor 5/5 strength noted with the following: hip flexion/knee flexion/extension, foot dorsi/plantar flexion, great toe extension intact bilaterally, no sensory deficit in any dermatome.   EXTREMITIES: pulses normal, full ROM SKIN: warm, color normal PSYCH: no abnormalities of mood noted, alert and oriented to situation   ED Results / Procedures / Treatments   Labs (all labs ordered are listed, but only abnormal results are displayed) Labs Reviewed  BASIC METABOLIC PANEL - Abnormal; Notable for the following components:      Result Value   Sodium 127 (*)    Chloride 94 (*)    CO2 21 (*)    BUN 108 (*)    Creatinine, Ser 11.07 (*)    Calcium 11.4 (*)    GFR, Estimated 5 (*)    All other components within normal limits  CBC - Abnormal; Notable for the following components:   RBC 3.53 (*)    Hemoglobin 9.7 (*)    HCT 30.0 (*)    All other components within normal limits  TROPONIN I (HIGH SENSITIVITY) - Abnormal; Notable for the following components:   Troponin I (High Sensitivity) 32 (*)    All other components within normal limits    EKG EKG Interpretation  Date/Time:  Wednesday Jul 31 2020 04:09:02 EDT Ventricular Rate:  69 PR Interval:  206 QRS Duration: 102 QT Interval:  406 QTC Calculation: 435 R Axis:   44 Text Interpretation: Sinus rhythm with occasional Premature ventricular complexes Otherwise normal ECG Confirmed by Ripley Fraise [phone]) on 07/31/2020 4:27:48 AM   Radiology DG  Chest 2 View  Result Date: 07/31/2020 CLINICAL DATA:  Chest pain EXAM: CHEST - 2 VIEW COMPARISON:  06/06/2020 FINDINGS: Normal heart size and mediastinal contours. No acute infiltrate or edema. No effusion or pneumothorax. No acute osseous findings. IMPRESSION: No active cardiopulmonary disease. Electronically Signed   By: Monte Fantasia M.D.   On: 07/31/2020 04:42    Procedures Procedures   Medications Ordered in ED Medications - No data to display  ED Course  I have reviewed the triage vital signs and the nursing notes.  Pertinent labs & imaging results that were available during my care of the patient were reviewed by me and considered in my medical decision making (see chart for details).    MDM Rules/Calculators/A&P                         5:02 AM Reports he is already feeling improved.  Pain is mostly isolated to the left scapular region.  He reports that improved at home with Tylenol, he declines pain meds at this time 6:00 AM Patient resting comfortably.  No acute distress.  X-ray was reviewed and is negative.  Labs overall near baseline though he does have some hyponatremia.  Troponin mildly elevated, likely due to chronic renal failure Patient had no anterior chest pain in the ED, reports pain is only in the left scapula and worse with movement and lying on that side. No hypoxia to suggest acute PE. At this point I feel patient is appropriate for discharge home. Plan to start muscle relaxants as well as advised heat therapy. Due to hyponatremia, have asked patient to scale back his Lasix dosing for 2 days.  Patient takes 160 mg twice daily under the  direction of his nephrologist.  Advised him to take 80 mg twice daily, he has follow-up next week with his transplant team as he is hoping to receive a kidney transplant  Final Clinical Impression(s) / ED Diagnoses Final diagnoses:  Muscle spasm    Rx / DC Orders ED Discharge Orders         Ordered    cyclobenzaprine  (FLEXERIL) 10 MG tablet  2 times daily PRN        07/31/20 0540           Ripley Fraise, MD 07/31/20 0602

## 2020-07-31 NOTE — ED Notes (Signed)
Patient transported to X-ray 

## 2020-07-31 NOTE — Telephone Encounter (Signed)
Please refill as per office routine med refill policy (all routine meds refilled for 3 mo or monthly per pt preference up to one year from last visit, then month to month grace period for 3 mo, then further med refills will have to be denied)  

## 2020-07-31 NOTE — ED Triage Notes (Signed)
Pt reports around 10pm he started having mid lumbar pain that radiated into his lung area. Pt reports he heard something "pop". Pt reports he was laying down at the time he heard the "pop". Pt reports he is also having 3/10 cp w/sob.

## 2020-07-31 NOTE — Discharge Instructions (Addendum)
As we discussed, please take Lasix 80 mg twice a day for 2 days, then you can go back to your  normal dosing.  Please be sure to have your sodium level and potassium and kidney levels checked next week   SEEK IMMEDIATE MEDICAL ATTENTION IF: You have severe chest pain, especially if the pain is crushing or pressure-like and spreads to the arms, back, neck, or jaw, or if you have sweating, nausea (feeling sick to your stomach), or shortness of breath. THIS IS AN EMERGENCY. Don't wait to see if the pain will go away. Get medical help at once. Call 911 or [phone] (operator). DO NOT drive yourself to the hospital.  Your chest pain gets worse and does not go away with rest.  You have an attack of chest pain lasting longer than usual, despite rest and treatment with the medications your caregiver has prescribed.  You wake from sleep with chest pain or shortness of breath.  You feel dizzy or faint.  You have chest pain not typical of your usual pain for which you originally saw your caregiver.

## 2020-08-07 ENCOUNTER — Other Ambulatory Visit: Payer: Self-pay | Admitting: Cardiology

## 2020-08-20 DIAGNOSIS — D631 Anemia in chronic kidney disease: Secondary | ICD-10-CM | POA: Diagnosis not present

## 2020-08-20 DIAGNOSIS — Z720 Tobacco use: Secondary | ICD-10-CM | POA: Diagnosis not present

## 2020-08-20 DIAGNOSIS — N185 Chronic kidney disease, stage 5: Secondary | ICD-10-CM | POA: Diagnosis not present

## 2020-08-20 DIAGNOSIS — I509 Heart failure, unspecified: Secondary | ICD-10-CM | POA: Diagnosis not present

## 2020-08-20 DIAGNOSIS — N2581 Secondary hyperparathyroidism of renal origin: Secondary | ICD-10-CM | POA: Diagnosis not present

## 2020-08-20 DIAGNOSIS — I120 Hypertensive chronic kidney disease with stage 5 chronic kidney disease or end stage renal disease: Secondary | ICD-10-CM | POA: Diagnosis not present

## 2020-08-20 DIAGNOSIS — N189 Chronic kidney disease, unspecified: Secondary | ICD-10-CM | POA: Diagnosis not present

## 2020-08-21 ENCOUNTER — Emergency Department (HOSPITAL_COMMUNITY): Payer: Medicare HMO

## 2020-08-21 ENCOUNTER — Other Ambulatory Visit: Payer: Self-pay

## 2020-08-21 ENCOUNTER — Inpatient Hospital Stay (HOSPITAL_COMMUNITY)
Admission: EM | Admit: 2020-08-21 | Discharge: 2020-08-28 | DRG: 981 | Disposition: A | Discharge status: Home / Self Care | Payer: Medicare HMO | Attending: Family Medicine | Admitting: Family Medicine

## 2020-08-21 DIAGNOSIS — Z4901 Encounter for fitting and adjustment of extracorporeal dialysis catheter: Secondary | ICD-10-CM | POA: Diagnosis not present

## 2020-08-21 DIAGNOSIS — Z8249 Family history of ischemic heart disease and other diseases of the circulatory system: Secondary | ICD-10-CM

## 2020-08-21 DIAGNOSIS — I509 Heart failure, unspecified: Secondary | ICD-10-CM | POA: Diagnosis not present

## 2020-08-21 DIAGNOSIS — M109 Gout, unspecified: Secondary | ICD-10-CM | POA: Diagnosis present

## 2020-08-21 DIAGNOSIS — Z6835 Body mass index (BMI) 35.0-35.9, adult: Secondary | ICD-10-CM | POA: Diagnosis not present

## 2020-08-21 DIAGNOSIS — E162 Hypoglycemia, unspecified: Secondary | ICD-10-CM | POA: Diagnosis not present

## 2020-08-21 DIAGNOSIS — E1122 Type 2 diabetes mellitus with diabetic chronic kidney disease: Secondary | ICD-10-CM | POA: Diagnosis not present

## 2020-08-21 DIAGNOSIS — N179 Acute kidney failure, unspecified: Secondary | ICD-10-CM | POA: Diagnosis not present

## 2020-08-21 DIAGNOSIS — I700 Atherosclerosis of aorta: Secondary | ICD-10-CM | POA: Diagnosis present

## 2020-08-21 DIAGNOSIS — D649 Anemia, unspecified: Secondary | ICD-10-CM | POA: Diagnosis not present

## 2020-08-21 DIAGNOSIS — K859 Acute pancreatitis without necrosis or infection, unspecified: Secondary | ICD-10-CM | POA: Diagnosis not present

## 2020-08-21 DIAGNOSIS — K429 Umbilical hernia without obstruction or gangrene: Secondary | ICD-10-CM | POA: Diagnosis present

## 2020-08-21 DIAGNOSIS — R935 Abnormal findings on diagnostic imaging of other abdominal regions, including retroperitoneum: Secondary | ICD-10-CM | POA: Diagnosis not present

## 2020-08-21 DIAGNOSIS — I5022 Chronic systolic (congestive) heart failure: Secondary | ICD-10-CM | POA: Diagnosis not present

## 2020-08-21 DIAGNOSIS — Z01810 Encounter for preprocedural cardiovascular examination: Secondary | ICD-10-CM | POA: Diagnosis not present

## 2020-08-21 DIAGNOSIS — N186 End stage renal disease: Secondary | ICD-10-CM | POA: Diagnosis present

## 2020-08-21 DIAGNOSIS — K219 Gastro-esophageal reflux disease without esophagitis: Secondary | ICD-10-CM | POA: Diagnosis present

## 2020-08-21 DIAGNOSIS — I429 Cardiomyopathy, unspecified: Secondary | ICD-10-CM | POA: Diagnosis present

## 2020-08-21 DIAGNOSIS — Z85850 Personal history of malignant neoplasm of thyroid: Secondary | ICD-10-CM

## 2020-08-21 DIAGNOSIS — G4733 Obstructive sleep apnea (adult) (pediatric): Secondary | ICD-10-CM | POA: Diagnosis present

## 2020-08-21 DIAGNOSIS — K8500 Idiopathic acute pancreatitis without necrosis or infection: Secondary | ICD-10-CM | POA: Diagnosis not present

## 2020-08-21 DIAGNOSIS — Z79890 Hormone replacement therapy (postmenopausal): Secondary | ICD-10-CM

## 2020-08-21 DIAGNOSIS — N2581 Secondary hyperparathyroidism of renal origin: Secondary | ICD-10-CM | POA: Diagnosis present

## 2020-08-21 DIAGNOSIS — I2510 Atherosclerotic heart disease of native coronary artery without angina pectoris: Secondary | ICD-10-CM | POA: Diagnosis present

## 2020-08-21 DIAGNOSIS — Z992 Dependence on renal dialysis: Secondary | ICD-10-CM | POA: Diagnosis not present

## 2020-08-21 DIAGNOSIS — Z823 Family history of stroke: Secondary | ICD-10-CM

## 2020-08-21 DIAGNOSIS — E669 Obesity, unspecified: Secondary | ICD-10-CM | POA: Diagnosis present

## 2020-08-21 DIAGNOSIS — F17210 Nicotine dependence, cigarettes, uncomplicated: Secondary | ICD-10-CM | POA: Diagnosis present

## 2020-08-21 DIAGNOSIS — D631 Anemia in chronic kidney disease: Secondary | ICD-10-CM | POA: Diagnosis present

## 2020-08-21 DIAGNOSIS — E872 Acidosis: Secondary | ICD-10-CM | POA: Diagnosis present

## 2020-08-21 DIAGNOSIS — E871 Hypo-osmolality and hyponatremia: Secondary | ICD-10-CM | POA: Diagnosis not present

## 2020-08-21 DIAGNOSIS — I132 Hypertensive heart and chronic kidney disease with heart failure and with stage 5 chronic kidney disease, or end stage renal disease: Secondary | ICD-10-CM | POA: Diagnosis present

## 2020-08-21 DIAGNOSIS — N250 Renal osteodystrophy: Secondary | ICD-10-CM | POA: Diagnosis not present

## 2020-08-21 DIAGNOSIS — I5043 Acute on chronic combined systolic (congestive) and diastolic (congestive) heart failure: Secondary | ICD-10-CM | POA: Diagnosis not present

## 2020-08-21 DIAGNOSIS — E876 Hypokalemia: Secondary | ICD-10-CM | POA: Diagnosis not present

## 2020-08-21 DIAGNOSIS — Z20822 Contact with and (suspected) exposure to covid-19: Secondary | ICD-10-CM | POA: Diagnosis not present

## 2020-08-21 DIAGNOSIS — M898X9 Other specified disorders of bone, unspecified site: Secondary | ICD-10-CM | POA: Diagnosis present

## 2020-08-21 DIAGNOSIS — R1013 Epigastric pain: Secondary | ICD-10-CM | POA: Diagnosis present

## 2020-08-21 DIAGNOSIS — N185 Chronic kidney disease, stage 5: Secondary | ICD-10-CM | POA: Diagnosis not present

## 2020-08-21 DIAGNOSIS — Z8611 Personal history of tuberculosis: Secondary | ICD-10-CM | POA: Diagnosis not present

## 2020-08-21 DIAGNOSIS — E890 Postprocedural hypothyroidism: Secondary | ICD-10-CM | POA: Diagnosis present

## 2020-08-21 DIAGNOSIS — K5909 Other constipation: Secondary | ICD-10-CM | POA: Diagnosis present

## 2020-08-21 DIAGNOSIS — Z7982 Long term (current) use of aspirin: Secondary | ICD-10-CM

## 2020-08-21 DIAGNOSIS — Z79899 Other long term (current) drug therapy: Secondary | ICD-10-CM

## 2020-08-21 DIAGNOSIS — Z9049 Acquired absence of other specified parts of digestive tract: Secondary | ICD-10-CM

## 2020-08-21 DIAGNOSIS — N261 Atrophy of kidney (terminal): Secondary | ICD-10-CM | POA: Diagnosis not present

## 2020-08-21 LAB — COMPREHENSIVE METABOLIC PANEL
ALT: 19 U/L (ref 0–44)
AST: 19 U/L (ref 15–41)
Albumin: 4.4 g/dL (ref 3.5–5.0)
Alkaline Phosphatase: 33 U/L — ABNORMAL LOW (ref 38–126)
Anion gap: 25 — ABNORMAL HIGH (ref 5–15)
BUN: 153 mg/dL — ABNORMAL HIGH (ref 6–20)
CO2: 19 mmol/L — ABNORMAL LOW (ref 22–32)
Calcium: 12.6 mg/dL — ABNORMAL HIGH (ref 8.9–10.3)
Chloride: 84 mmol/L — ABNORMAL LOW (ref 98–111)
Creatinine, Ser: 15.56 mg/dL — ABNORMAL HIGH (ref 0.61–1.24)
GFR, Estimated: 3 mL/min — ABNORMAL LOW (ref >60)
Glucose, Bld: 130 mg/dL — ABNORMAL HIGH (ref 70–99)
Potassium: 3.5 mmol/L (ref 3.5–5.1)
Sodium: 128 mmol/L — ABNORMAL LOW (ref 135–145)
Total Bilirubin: 0.8 mg/dL (ref 0.3–1.2)
Total Protein: 7.2 g/dL (ref 6.5–8.1)

## 2020-08-21 LAB — CBC WITH DIFFERENTIAL/PLATELET
Abs Immature Granulocytes: 0.07 10*3/uL (ref 0.00–0.07)
Basophils Absolute: 0 10*3/uL (ref 0.0–0.1)
Basophils Relative: 0 %
Eosinophils Absolute: 0.1 10*3/uL (ref 0.0–0.5)
Eosinophils Relative: 1 %
HCT: 32.2 % — ABNORMAL LOW (ref 39.0–52.0)
Hemoglobin: 10.8 g/dL — ABNORMAL LOW (ref 13.0–17.0)
Immature Granulocytes: 1 %
Lymphocytes Relative: 4 %
Lymphs Abs: 0.3 10*3/uL — ABNORMAL LOW (ref 0.7–4.0)
MCH: 27.5 pg (ref 26.0–34.0)
MCHC: 33.5 g/dL (ref 30.0–36.0)
MCV: 81.9 fL (ref 80.0–100.0)
Monocytes Absolute: 0.4 10*3/uL (ref 0.1–1.0)
Monocytes Relative: 4 %
Neutro Abs: 8 10*3/uL — ABNORMAL HIGH (ref 1.7–7.7)
Neutrophils Relative %: 90 %
Platelets: 256 10*3/uL (ref 150–400)
RBC: 3.93 MIL/uL — ABNORMAL LOW (ref 4.22–5.81)
RDW: 12 % (ref 11.5–15.5)
WBC: 8.9 10*3/uL (ref 4.0–10.5)
nRBC: 0 % (ref 0.0–0.2)

## 2020-08-21 LAB — URINALYSIS, ROUTINE W REFLEX MICROSCOPIC
Bilirubin Urine: NEGATIVE
Glucose, UA: NEGATIVE mg/dL
Hgb urine dipstick: NEGATIVE
Ketones, ur: NEGATIVE mg/dL
Leukocytes,Ua: NEGATIVE
Nitrite: NEGATIVE
Protein, ur: 30 mg/dL — AB
Specific Gravity, Urine: 1.013 (ref 1.005–1.030)
pH: 5 (ref 5.0–8.0)

## 2020-08-21 MED ORDER — ONDANSETRON HCL 4 MG/2ML IJ SOLN: 4.0000 mg | Freq: Once | INTRAMUSCULAR | Status: DC

## 2020-08-21 MED ORDER — HYDROMORPHONE HCL 1 MG/ML IJ SOLN
1.0000 mg | Freq: Once | INTRAMUSCULAR | Status: AC
  Administered 2020-08-22: 1 mg via INTRAVENOUS
  Filled 2020-08-21: qty 1

## 2020-08-21 NOTE — ED Triage Notes (Signed)
Abdominal pain, nausea and vomiting x 2 days. Tender to palpation.     Dx with hernia last month.

## 2020-08-21 NOTE — ED Provider Notes (Addendum)
Emergency Medicine Provider Triage Evaluation Note  Craig Flynn , a 60 y.o. male  was evaluated in triage.  Pt complains of abdominal pain.  States the pain started yesterday.  Reports associated n/v and constipation.  Worsened with palpation.  States he feels like his hernia ruptured..  Review of Systems  Positive: Nausea, vomiting, abdominal pain Negative: diarrhea  Physical Exam  Ht 6\' 2"  (1.88 m)   Wt 126 kg   BMI 35.66 kg/m  Gen:   Awake, no distress   Resp:  Normal effort  MSK:   Moves extremities without difficulty  Other:  Abdomen tender and slightly distended, small umbilical hernia  Medical Decision Making  Medically screening exam initiated at 10:41 PM.  Appropriate orders placed.  [name] was informed that the remainder of the evaluation will be completed by another provider, this initial triage assessment does not replace that evaluation, and the importance of remaining in the ED until their evaluation is complete.     Montine Circle, PA-C 08/21/20 2243    Montine Circle, PA-C 08/21/20 2243    Horton, Alvin Critchley, DO 08/21/20 2331

## 2020-08-22 ENCOUNTER — Encounter (HOSPITAL_COMMUNITY): Payer: Self-pay | Admitting: Internal Medicine

## 2020-08-22 ENCOUNTER — Inpatient Hospital Stay (HOSPITAL_COMMUNITY): Payer: Medicare HMO

## 2020-08-22 DIAGNOSIS — N179 Acute kidney failure, unspecified: Secondary | ICD-10-CM | POA: Diagnosis not present

## 2020-08-22 DIAGNOSIS — N2581 Secondary hyperparathyroidism of renal origin: Secondary | ICD-10-CM | POA: Diagnosis present

## 2020-08-22 DIAGNOSIS — R935 Abnormal findings on diagnostic imaging of other abdominal regions, including retroperitoneum: Secondary | ICD-10-CM | POA: Diagnosis not present

## 2020-08-22 DIAGNOSIS — Z992 Dependence on renal dialysis: Secondary | ICD-10-CM | POA: Diagnosis not present

## 2020-08-22 DIAGNOSIS — E871 Hypo-osmolality and hyponatremia: Secondary | ICD-10-CM | POA: Diagnosis not present

## 2020-08-22 DIAGNOSIS — Z01810 Encounter for preprocedural cardiovascular examination: Secondary | ICD-10-CM | POA: Diagnosis not present

## 2020-08-22 DIAGNOSIS — G4733 Obstructive sleep apnea (adult) (pediatric): Secondary | ICD-10-CM | POA: Diagnosis present

## 2020-08-22 DIAGNOSIS — Z6835 Body mass index (BMI) 35.0-35.9, adult: Secondary | ICD-10-CM | POA: Diagnosis not present

## 2020-08-22 DIAGNOSIS — I132 Hypertensive heart and chronic kidney disease with heart failure and with stage 5 chronic kidney disease, or end stage renal disease: Secondary | ICD-10-CM | POA: Diagnosis present

## 2020-08-22 DIAGNOSIS — K219 Gastro-esophageal reflux disease without esophagitis: Secondary | ICD-10-CM | POA: Diagnosis present

## 2020-08-22 DIAGNOSIS — E890 Postprocedural hypothyroidism: Secondary | ICD-10-CM | POA: Diagnosis present

## 2020-08-22 DIAGNOSIS — N186 End stage renal disease: Secondary | ICD-10-CM | POA: Diagnosis not present

## 2020-08-22 DIAGNOSIS — M109 Gout, unspecified: Secondary | ICD-10-CM | POA: Diagnosis present

## 2020-08-22 DIAGNOSIS — K859 Acute pancreatitis without necrosis or infection, unspecified: Secondary | ICD-10-CM | POA: Diagnosis not present

## 2020-08-22 DIAGNOSIS — E872 Acidosis: Secondary | ICD-10-CM | POA: Diagnosis present

## 2020-08-22 DIAGNOSIS — Z8611 Personal history of tuberculosis: Secondary | ICD-10-CM | POA: Diagnosis not present

## 2020-08-22 DIAGNOSIS — E669 Obesity, unspecified: Secondary | ICD-10-CM | POA: Diagnosis present

## 2020-08-22 DIAGNOSIS — R1013 Epigastric pain: Secondary | ICD-10-CM | POA: Diagnosis present

## 2020-08-22 DIAGNOSIS — I700 Atherosclerosis of aorta: Secondary | ICD-10-CM | POA: Diagnosis present

## 2020-08-22 DIAGNOSIS — Z85850 Personal history of malignant neoplasm of thyroid: Secondary | ICD-10-CM | POA: Diagnosis not present

## 2020-08-22 DIAGNOSIS — I2510 Atherosclerotic heart disease of native coronary artery without angina pectoris: Secondary | ICD-10-CM | POA: Diagnosis present

## 2020-08-22 DIAGNOSIS — I429 Cardiomyopathy, unspecified: Secondary | ICD-10-CM | POA: Diagnosis present

## 2020-08-22 DIAGNOSIS — Z20822 Contact with and (suspected) exposure to covid-19: Secondary | ICD-10-CM | POA: Diagnosis present

## 2020-08-22 DIAGNOSIS — N185 Chronic kidney disease, stage 5: Secondary | ICD-10-CM | POA: Diagnosis not present

## 2020-08-22 DIAGNOSIS — Z4901 Encounter for fitting and adjustment of extracorporeal dialysis catheter: Secondary | ICD-10-CM | POA: Diagnosis not present

## 2020-08-22 DIAGNOSIS — K8500 Idiopathic acute pancreatitis without necrosis or infection: Secondary | ICD-10-CM | POA: Diagnosis not present

## 2020-08-22 DIAGNOSIS — I5022 Chronic systolic (congestive) heart failure: Secondary | ICD-10-CM | POA: Diagnosis present

## 2020-08-22 DIAGNOSIS — K429 Umbilical hernia without obstruction or gangrene: Secondary | ICD-10-CM | POA: Diagnosis present

## 2020-08-22 DIAGNOSIS — D631 Anemia in chronic kidney disease: Secondary | ICD-10-CM | POA: Diagnosis present

## 2020-08-22 HISTORY — PX: IR US GUIDE VASC ACCESS RIGHT: IMG2390

## 2020-08-22 HISTORY — DX: Acute pancreatitis without necrosis or infection, unspecified: K85.90

## 2020-08-22 HISTORY — PX: IR FLUORO GUIDE CV LINE RIGHT: IMG2283

## 2020-08-22 LAB — CBC
HCT: 30.8 % — ABNORMAL LOW (ref 39.0–52.0)
Hemoglobin: 9.9 g/dL — ABNORMAL LOW (ref 13.0–17.0)
MCH: 27 pg (ref 26.0–34.0)
MCHC: 32.1 g/dL (ref 30.0–36.0)
MCV: 84.2 fL (ref 80.0–100.0)
Platelets: 191 10*3/uL (ref 150–400)
RBC: 3.66 MIL/uL — ABNORMAL LOW (ref 4.22–5.81)
RDW: 12.3 % (ref 11.5–15.5)
WBC: 9.1 10*3/uL (ref 4.0–10.5)
nRBC: 0 % (ref 0.0–0.2)

## 2020-08-22 LAB — I-STAT VENOUS BLOOD GAS, ED
Acid-base deficit: 5 mmol/L — ABNORMAL HIGH (ref 0.0–2.0)
Bicarbonate: 20.6 mmol/L (ref 20.0–28.0)
Calcium, Ion: 1.43 mmol/L — ABNORMAL HIGH (ref 1.15–1.40)
HCT: 31 % — ABNORMAL LOW (ref 39.0–52.0)
Hemoglobin: 10.5 g/dL — ABNORMAL LOW (ref 13.0–17.0)
O2 Saturation: 54 %
Potassium: 3.8 mmol/L (ref 3.5–5.1)
Sodium: 131 mmol/L — ABNORMAL LOW (ref 135–145)
TCO2: 22 mmol/L (ref 22–32)
pCO2, Ven: 39.6 mmHg — ABNORMAL LOW (ref 44.0–60.0)
pH, Ven: 7.325 (ref 7.250–7.430)
pO2, Ven: 31 mmHg — CL (ref 32.0–45.0)

## 2020-08-22 LAB — COMPREHENSIVE METABOLIC PANEL
ALT: 15 U/L (ref 0–44)
AST: 18 U/L (ref 15–41)
Albumin: 3.9 g/dL (ref 3.5–5.0)
Alkaline Phosphatase: 28 U/L — ABNORMAL LOW (ref 38–126)
Anion gap: 15 (ref 5–15)
BUN: 154 mg/dL — ABNORMAL HIGH (ref 6–20)
CO2: 20 mmol/L — ABNORMAL LOW (ref 22–32)
Calcium: 11.7 mg/dL — ABNORMAL HIGH (ref 8.9–10.3)
Chloride: 94 mmol/L — ABNORMAL LOW (ref 98–111)
Creatinine, Ser: 15.48 mg/dL — ABNORMAL HIGH (ref 0.61–1.24)
GFR, Estimated: 3 mL/min — ABNORMAL LOW (ref >60)
Glucose, Bld: 109 mg/dL — ABNORMAL HIGH (ref 70–99)
Potassium: 3.8 mmol/L (ref 3.5–5.1)
Sodium: 129 mmol/L — ABNORMAL LOW (ref 135–145)
Total Bilirubin: 0.6 mg/dL (ref 0.3–1.2)
Total Protein: 6.5 g/dL (ref 6.5–8.1)

## 2020-08-22 LAB — RESP PANEL BY RT-PCR (FLU A&B, COVID) ARPGX2
Influenza A by PCR: NEGATIVE
Influenza B by PCR: NEGATIVE
SARS Coronavirus 2 by RT PCR: NEGATIVE

## 2020-08-22 LAB — HIV ANTIBODY (ROUTINE TESTING W REFLEX): HIV Screen 4th Generation wRfx: NONREACTIVE

## 2020-08-22 LAB — LIPASE, BLOOD
Lipase: 1206 U/L — ABNORMAL HIGH (ref 11–51)
Lipase: 1378 U/L — ABNORMAL HIGH (ref 11–51)

## 2020-08-22 LAB — PHOSPHORUS: Phosphorus: 7.1 mg/dL — ABNORMAL HIGH (ref 2.5–4.6)

## 2020-08-22 LAB — TRIGLYCERIDES: Triglycerides: 162 mg/dL — ABNORMAL HIGH (ref <150)

## 2020-08-22 LAB — MAGNESIUM: Magnesium: 2 mg/dL (ref 1.7–2.4)

## 2020-08-22 MED ORDER — HYDROMORPHONE HCL 1 MG/ML IJ SOLN
1.0000 mg | INTRAMUSCULAR | Status: DC | PRN
  Administered 2020-08-22 – 2020-08-26 (×13): 1 mg via INTRAVENOUS
  Filled 2020-08-22 (×14): qty 1

## 2020-08-22 MED ORDER — FENTANYL CITRATE (PF) 100 MCG/2ML IJ SOLN
INTRAMUSCULAR | Status: AC
  Filled 2020-08-22: qty 2

## 2020-08-22 MED ORDER — MIDAZOLAM HCL 2 MG/2ML IJ SOLN
INTRAMUSCULAR | Status: AC | PRN
  Administered 2020-08-22: 1 mg via INTRAVENOUS

## 2020-08-22 MED ORDER — ACETAMINOPHEN 325 MG PO TABS
650.0000 mg | ORAL_TABLET | Freq: Four times a day (QID) | ORAL | Status: DC | PRN
  Administered 2020-08-22 – 2020-08-28 (×5): 650 mg via ORAL
  Filled 2020-08-22 (×5): qty 2

## 2020-08-22 MED ORDER — LIDOCAINE HCL (PF) 1 % IJ SOLN
INTRAMUSCULAR | Status: AC
  Filled 2020-08-22: qty 30

## 2020-08-22 MED ORDER — SODIUM BICARBONATE 650 MG PO TABS
650.0000 mg | ORAL_TABLET | Freq: Three times a day (TID) | ORAL | Status: DC
  Administered 2020-08-22: 650 mg via ORAL
  Filled 2020-08-22: qty 1

## 2020-08-22 MED ORDER — LIDOCAINE HCL (PF) 1 % IJ SOLN
INTRAMUSCULAR | Status: AC | PRN
  Administered 2020-08-22: 5 mL

## 2020-08-22 MED ORDER — SODIUM CHLORIDE 0.45 % IV SOLN
INTRAVENOUS | Status: DC
  Filled 2020-08-22 (×2): qty 75

## 2020-08-22 MED ORDER — FENTANYL CITRATE (PF) 100 MCG/2ML IJ SOLN
INTRAMUSCULAR | Status: AC | PRN
  Administered 2020-08-22: 25 ug via INTRAVENOUS

## 2020-08-22 MED ORDER — MELATONIN 3 MG PO TABS
3.0000 mg | ORAL_TABLET | Freq: Every evening | ORAL | Status: DC | PRN
  Administered 2020-08-26 – 2020-08-27 (×2): 3 mg via ORAL
  Filled 2020-08-22 (×3): qty 1

## 2020-08-22 MED ORDER — MIDAZOLAM HCL 2 MG/2ML IJ SOLN
INTRAMUSCULAR | Status: AC
  Filled 2020-08-22: qty 2

## 2020-08-22 MED ORDER — HEPARIN SODIUM (PORCINE) 1000 UNIT/ML IJ SOLN
INTRAMUSCULAR | Status: AC
  Filled 2020-08-22: qty 1

## 2020-08-22 MED ORDER — ONDANSETRON HCL 4 MG/2ML IJ SOLN
4.0000 mg | Freq: Four times a day (QID) | INTRAMUSCULAR | Status: DC | PRN
  Administered 2020-08-23: 4 mg via INTRAVENOUS
  Filled 2020-08-22: qty 2

## 2020-08-22 MED ORDER — LACTATED RINGERS IV SOLN: INTRAVENOUS | Status: DC

## 2020-08-22 MED ORDER — HEPARIN SODIUM (PORCINE) 5000 UNIT/ML IJ SOLN
5000.0000 [IU] | Freq: Three times a day (TID) | INTRAMUSCULAR | Status: DC
  Administered 2020-08-22 – 2020-08-28 (×18): 5000 [IU] via SUBCUTANEOUS
  Filled 2020-08-22 (×18): qty 1

## 2020-08-22 MED ORDER — LEVOTHYROXINE SODIUM 100 MCG PO TABS: 100.0000 ug | ORAL_TABLET | Freq: Every day | ORAL | Status: DC

## 2020-08-22 MED ORDER — SENNOSIDES-DOCUSATE SODIUM 8.6-50 MG PO TABS
1.0000 | ORAL_TABLET | Freq: Every day | ORAL | Status: DC
  Administered 2020-08-22: 1 via ORAL
  Filled 2020-08-22: qty 1

## 2020-08-22 MED ORDER — LEVOTHYROXINE SODIUM 25 MCG PO TABS
137.0000 ug | ORAL_TABLET | Freq: Every day | ORAL | Status: DC
  Administered 2020-08-22 – 2020-08-28 (×7): 137 ug via ORAL
  Filled 2020-08-22 (×8): qty 1

## 2020-08-22 MED ORDER — ALLOPURINOL 100 MG PO TABS
100.0000 mg | ORAL_TABLET | Freq: Every day | ORAL | Status: DC
  Administered 2020-08-22 – 2020-08-28 (×6): 100 mg via ORAL
  Filled 2020-08-22 (×7): qty 1

## 2020-08-22 MED ORDER — CHLORHEXIDINE GLUCONATE CLOTH 2 % EX PADS
6.0000 | MEDICATED_PAD | Freq: Every day | CUTANEOUS | Status: DC
  Administered 2020-08-23 – 2020-08-28 (×5): 6 via TOPICAL

## 2020-08-22 MED ORDER — CEFAZOLIN SODIUM-DEXTROSE 2-4 GM/100ML-% IV SOLN
2.0000 g | Freq: Once | INTRAVENOUS | Status: AC
  Administered 2020-08-22: 2 g via INTRAVENOUS
  Filled 2020-08-22: qty 100

## 2020-08-22 MED ORDER — FENTANYL CITRATE (PF) 100 MCG/2ML IJ SOLN: 100.0000 ug | Freq: Once | INTRAMUSCULAR | Status: DC

## 2020-08-22 MED ORDER — HEPARIN SODIUM (PORCINE) 5000 UNIT/ML IJ SOLN: 5000.0000 [IU] | Freq: Three times a day (TID) | INTRAMUSCULAR | Status: DC

## 2020-08-22 MED ORDER — OXYCODONE HCL 5 MG PO TABS
5.0000 mg | ORAL_TABLET | Freq: Four times a day (QID) | ORAL | Status: DC | PRN
  Administered 2020-08-22 – 2020-08-26 (×9): 5 mg via ORAL
  Filled 2020-08-22 (×9): qty 1

## 2020-08-22 MED ORDER — SODIUM CHLORIDE 0.9 % IV BOLUS
1000.0000 mL | Freq: Once | INTRAVENOUS | Status: AC
  Administered 2020-08-22: 1000 mL via INTRAVENOUS

## 2020-08-22 NOTE — ED Provider Notes (Signed)
[facility] EMERGENCY DEPARTMENT Provider Note   CSN: [id number] Arrival date & time: 08/21/20  2140     History Chief Complaint  Patient presents with  . Abdominal Pain  . Emesis  . Nausea  . Hernia    Craig Flynn is a 60 y.o. male.  Patient presents to the emergency department with a chief complaint of epigastric abdominal pain.  He states the pain started yesterday.  He reports associated nausea and vomiting.  He also reports constipation.  States that he has a left hernia, and he feels like it has ruptured.  He rates the pain as moderate to severe.  He has not taken anything for his symptoms.  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  He denies drinking alcohol.  Denies fevers or chills.  Symptoms are worsened with palpation.  The history is provided by the patient. No language interpreter was used.       Past Medical History:  Diagnosis Date  . Anemia 06/2015  . Arthritis    knee  . Cancer [facility])    thyroid cancer  . Cardiomyopathy ([place])    a. 10/2013: EF reduced to 35-40% b. 09/2014: EF improved to 55-60%, Grade 1 DD noted.  . Chest pain 06/2015  . CHF (congestive heart failure) ([place])   . CKD (chronic kidney disease), stage IV [facility])    sees  Dr Lorrene Reid  . Dyspnea    with exerion   . GERD (gastroesophageal reflux disease)   . Gout   . Hypertension   . Obesity   . Sleep apnea    not wearing c-pap now, needs new slep study per pt. (01/22/2017)  . Tuberculosis 1981   while the pt was in the service.  . Tubular adenoma of colon 10/2015  . Wears glasses     Patient Active Problem List   Diagnosis Date Noted  . B12 deficiency 06/08/2020  . Gynecomastia, male 06/07/2020  . Erectile dysfunction 06/07/2020  . Hand cramp 06/07/2020  . Aortic atherosclerosis ([place]) 06/07/2020  . Anemia due to acquired thiamine deficiency 03/25/2020  . Umbilical hernia without  obstruction and without gangrene 03/18/2020  . 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 Deficiency anemia 03/18/2020  . Hyperlipidemia LDL goal <130 03/18/2020  . Upper back pain on left side 02/06/2020  . Sinusitis 10/16/2019  . Allergic rhinitis 10/16/2019  . CAD (coronary artery disease) 09/07/2019  . Nephrosclerosis 09/07/2019  . Obesity (BMI 30-39.9) 09/07/2019  . Secondary hyperparathyroidism ([place]) 09/07/2019  . CKD (chronic kidney disease), stage V ([place]) 06/22/2019  . Obesity, diabetes, and hypertension syndrome ([place]) 06/22/2019  . Ulnar neuritis, right 04/25/2019  . Muscle cramps 02/26/2019  . Abnormal TSH 02/26/2019  . Vitamin D deficiency 02/22/2019  . Iron deficiency 02/22/2019  . Chest pain 11/01/2018  . Laryngopharyngeal reflux (LPR) 10/04/2018  . Post-nasal drainage 10/04/2018  . Whiplash injuries, initial encounter 09/21/2018  . Degenerative arthritis of right knee 09/21/2018  . Chronic diastolic HF (heart failure) ([place]) 07/04/2018  . Intractable post-traumatic headache 06/16/2018  . Low back pain 06/15/2018  . Neck pain 06/15/2018  . 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 Thyroid nodule 01/22/2017  . Ptosis 01/09/2016  . Chronic systolic heart failure ([place]) 08/01/2015  . GERD (gastroesophageal reflux disease) 07/29/2015  . Osteoarthritis 05/03/2015  . Hyperlipidemia 02/26/2015  . Diastolic dysfunction 71/69/6789  . Tobacco abuse 02/26/2015  .  OSA (obstructive sleep apnea) 12/27/2014  . Encounter for well adult exam with abnormal findings 11/12/2014  . Anemia in chronic kidney disease 11/12/2014  . Left knee pain 09/22/2014  . Paresthesia of left leg 09/21/2014  . Acute systolic CHF (congestive heart failure) ([place]) 11/17/2013  . Morbid obesity ([place]) 11/17/2013  . Diabetes mellitus ([place]) 12/12/2011  . Acute on chronic renal failure ([place]) 12/11/2011  . HTN (hypertension) 12/11/2011  . SOB (shortness of breath) 12/11/2011  . Gout 12/31/2006  .  HERNIATED LUMBAR DISK WITH RADICULOPATHY 12/30/2006    Past Surgical History:  Procedure Laterality Date  . AV FISTULA PLACEMENT Left 03/25/2016   Procedure: Creation of Left Arm ARTERIOVENOUS (AV) FISTULA;  Surgeon: Elam Dutch, MD;  Location: [facility] OR;  Service: Vascular;  Laterality: Left;  . COLONOSCOPY W/ BIOPSIES AND POLYPECTOMY     "no problem"  . HERNIA REPAIR    . LAPAROSCOPIC CHOLECYSTECTOMY    . LIGATION OF COMPETING BRANCHES OF ARTERIOVENOUS FISTULA Left 01/25/2018   Procedure: LIGATION OF COMPETING BRANCHES And Revision of ARTERIOVENOUS FISTULA LEFT ARM.;  Surgeon: Elam Dutch, MD;  Location: [place] [facility];  Service: Vascular;  Laterality: Left;  . THYROIDECTOMY  01/22/2017  . THYROIDECTOMY Left 01/22/2017   Procedure: THYROIDECTOMY;  Surgeon: Melida Quitter, MD;  Location: [facility];  Service: ENT;  Laterality: Left;  . THYROIDECTOMY Right 03/26/2017   Procedure: COMPLETION OF THYROIDECTOMY;  Surgeon: Melida Quitter, MD;  Location: [facility];  Service: ENT;  Laterality: Right;  . UMBILICAL HERNIA REPAIR    . WISDOM TOOTH EXTRACTION         Family History  Problem Relation Age of Onset  . Stroke Mother   . Pneumonia Father        died of Pneumonia x3  . Kidney disease Maternal Grandmother   . Hypertension Maternal Grandmother   . Healthy Maternal Grandfather   . Healthy Paternal Grandmother   . Healthy Paternal Grandfather   . CAD Neg Hx   . Colon cancer Neg Hx   . Esophageal cancer Neg Hx   . Pancreatic cancer Neg Hx   . Prostate cancer Neg Hx   . Stomach cancer Neg Hx   . Rectal cancer Neg Hx     Social History   Tobacco Use  . Smoking status: Current Every Day Smoker    Packs/day: 0.50    Years: 40.00    Pack years: 20.00    Types: Cigarettes  . Smokeless tobacco: Never Used  Vaping Use  . Vaping Use: Never used  Substance Use Topics  . Alcohol use: Not Currently    Alcohol/week: 0.0 standard drinks  . Drug use: No    Home Medications Prior to Admission  medications   Medication Sig Start Date End Date Taking? Authorizing Provider  acetaminophen (TYLENOL) 500 MG tablet Take 2 tablets (1,000 mg total) by mouth every 8 (eight) hours as needed for mild pain or headache. 05/05/18   Lance Sell, NP  albuterol (VENTOLIN HFA) 108 (90 Base) MCG/ACT inhaler INHALE 2 PUFFS INTO THE LUNGS EVERY 6 HOURS AS NEEDED FOR WHEEZING OR SHORTNESS OF BREATH 08/01/20   Biagio Borg, MD  allopurinol (ZYLOPRIM) 100 MG tablet Take 150 mg by mouth daily. Taking 1 & 1/2 tab= 150 mg 10/27/18   [provider]  aspirin EC 81 MG tablet Take 81 mg by mouth daily.    [provider]  calcitRIOL (ROCALTROL) 0.5 MCG capsule Take 0.5 mcg by mouth every  Monday, Wednesday, and Friday. 12/10/18   [provider]  calcium elemental as carbonate (BARIATRIC TUMS ULTRA) 400 MG chewable tablet Chew 1,000 mg by mouth as needed for heartburn.     [provider]  carvedilol (COREG) 25 MG tablet TAKE 1 TABLET(25 MG) BY MOUTH TWICE DAILY 03/20/20   Biagio Borg, MD  cyclobenzaprine (FLEXERIL) 10 MG tablet Take 1 tablet (10 mg total) by mouth 2 (two) times daily as needed for muscle spasms. 07/31/20   Ripley Fraise, MD  fenofibrate 54 MG tablet TAKE 1 TABLET BY MOUTH EVERY DAY 08/07/20   Minus Breeding, MD  furosemide (LASIX) 80 MG tablet Take 1.5 tablets (120 mg total) by mouth See admin instructions. Take 120 mg by mouth in the morning and 120 mg in the afternoon 03/26/20   Isla Pence, MD  hydrALAZINE (APRESOLINE) 25 MG tablet Take 25 mg by mouth in the morning and at bedtime. 07/31/19   [provider]  iron polysaccharides (NIFEREX) 150 MG capsule Take 150 mg by mouth daily. 12/18/16   [provider]  isosorbide mononitrate (IMDUR) 30 MG 24 hr tablet TAKE 2 TABLETS(60 MG) BY MOUTH DAILY Patient taking differently: Take 60 mg by mouth daily. TAKE 2 TABLETS(60 MG) BY MOUTH DAILY 01/15/20   Minus Breeding, MD  levothyroxine  (SYNTHROID) 100 MCG tablet Take 1 tablet (100 mcg total) by mouth daily. 03/24/20   Janith Lima, MD  nitroGLYCERIN (NITROSTAT) 0.4 MG SL tablet DISSOLVE 1 TABLET UNDER THE TONGUE EVERY 5 MINUTES AS NEEDED FOR CHEST PAIN Patient taking differently: Place 0.4 mg under the tongue every 5 (five) minutes as needed for chest pain. 03/29/19   Tami Lin, MD  pantoprazole (PROTONIX) 40 MG tablet Take 1 tablet (40 mg total) by mouth 2 (two) times daily before a meal. 11/29/19   Biagio Borg, MD  Potassium Chloride ER 20 MEQ TBCR Take 20 mEq by mouth daily. 08/16/19   [provider]  sodium bicarbonate 650 MG tablet Take 1 tablet (650 mg total) by mouth 3 (three) times daily. 05/26/19   Biagio Borg, MD  thiamine (VITAMIN B-1) 50 MG tablet Take 1 tablet (50 mg total) by mouth daily. 03/25/20   Janith Lima, MD    Allergies    Patient has no known allergies.  Review of Systems   Review of Systems  All other systems reviewed and are negative.   Physical Exam Updated Vital Signs BP (!) 96/48 (BP Location: Right Arm)   Pulse 69   Temp 97.7 F (36.5 C) (Oral)   Resp 17   Ht 6\' 2"  (1.88 m)   Wt 126 kg   SpO2 100%   BMI 35.66 kg/m   Physical Exam Vitals and nursing note reviewed.  Constitutional:      Appearance: He is well-developed.  HENT:     Head: Normocephalic and atraumatic.  Eyes:     Conjunctiva/sclera: Conjunctivae normal.  Cardiovascular:     Rate and Rhythm: Normal rate and regular rhythm.     Heart sounds: No murmur heard.   Pulmonary:     Effort: Pulmonary effort is normal. No respiratory distress.     Breath sounds: Normal breath sounds.  Abdominal:     Palpations: Abdomen is soft.     Tenderness: There is abdominal tenderness.  Musculoskeletal:        General: Normal range of motion.     Cervical back: Neck supple.  Skin:    General:  Skin is warm and dry.  Neurological:     Mental Status: He is alert and oriented to person, place, and time.   Psychiatric:        Mood and Affect: Mood normal.        Behavior: Behavior normal.     ED Results / Procedures / Treatments   Labs (all labs ordered are listed, but only abnormal results are displayed) Labs Reviewed  COMPREHENSIVE METABOLIC PANEL - Abnormal; Notable for the following components:      Result Value   Sodium 128 (*)    Chloride 84 (*)    CO2 19 (*)    Glucose, Bld 130 (*)    BUN 153 (*)    Creatinine, Ser 15.56 (*)    Calcium 12.6 (*)    Alkaline Phosphatase 33 (*)    GFR, Estimated 3 (*)    Anion gap 25 (*)    All other components within normal limits  LIPASE, BLOOD - Abnormal; Notable for the following components:   Lipase 1,206 (*)    All other components within normal limits  CBC WITH DIFFERENTIAL/PLATELET - Abnormal; Notable for the following components:   RBC 3.93 (*)    Hemoglobin 10.8 (*)    HCT 32.2 (*)    Neutro Abs 8.0 (*)    Lymphs Abs 0.3 (*)    All other components within normal limits  URINALYSIS, ROUTINE W REFLEX MICROSCOPIC - Abnormal; Notable for the following components:   Protein, ur 30 (*)    Bacteria, UA RARE (*)    All other components within normal limits  RESP PANEL BY RT-PCR (FLU A&B, COVID) [id number]    EKG None  Radiology CT ABDOMEN PELVIS WO CONTRAST  Result Date: 08/21/2020 CLINICAL DATA:  Abdominal pain, nausea and vomiting for 2 days, tenderness to palpation EXAM: CT ABDOMEN AND PELVIS WITHOUT CONTRAST TECHNIQUE: Multidetector CT imaging of the abdomen and pelvis was performed following the standard protocol without IV contrast. COMPARISON:  03/26/2020 FINDINGS: Lower chest: No acute pleural or parenchymal lung disease. Hepatobiliary: Gallbladder is surgically absent. Unenhanced imaging of the liver demonstrates no focal abnormality. No biliary duct dilation. Pancreas: Significant inflammatory changes surround the head and body of the pancreas, consistent with acute pancreatitis. No evidence of fluid collection, pseudocyst, or  abscess. Spleen: Normal in size without focal abnormality. Adrenals/Urinary Tract: The adrenals are stable. Bilateral renal cortical atrophy unchanged. No urinary tract calculi or obstructive uropathy. Bladder is decompressed which limits its evaluation. Stomach/Bowel: No bowel obstruction or ileus. Normal appendix right lower quadrant. No bowel wall thickening or inflammatory change. Vascular/Lymphatic: Aortic atherosclerosis. No enlarged abdominal or pelvic lymph nodes. Reproductive: Prostate is unremarkable. Other: Trace mesenteric free fluid within the upper abdomen related to pancreatitis. No free gas. Stable small umbilical hernia. Musculoskeletal: No acute or destructive bony lesions. Reconstructed images demonstrate no additional findings. IMPRESSION: 1. Acute uncomplicated pancreatitis. No fluid collection, pseudocyst, or abscess. 2. Stable small umbilical hernia. 3.  Aortic Atherosclerosis (ICD10-I70.0). Electronically Signed   By: Randa Ngo M.D.   On: 08/21/2020 23:22    Procedures Procedures   Medications Ordered in ED Medications  HYDROmorphone (DILAUDID) injection 1 mg (has no administration in time range)  ondansetron (ZOFRAN) injection 4 mg (has no administration in time range)  sodium chloride 0.9 % bolus 1,000 mL (1,000 mLs Intravenous New Bag/Given 08/22/20 0255)    ED Course  I have reviewed the triage vital signs and the nursing notes.  Pertinent labs & imaging results  that were available during my care of the patient were reviewed by me and considered in my medical decision making (see chart for details).    MDM Rules/Calculators/A&P                          This patient complains of abdominal pain, this involves an extensive number of treatment options, and is a complaint that carries with it a high risk of complications and morbidity.    Differential Dx SBO, constipation, pancreatitis, cholecystitis  Pertinent Labs I ordered, reviewed, and interpreted labs,  which included lipase 1200 creatinine 15.5, BUN 153, anion gap high at 25, NA 128, CL 84, K is 3.5.  Will start fluids.  Imaging Interpretation I ordered imaging studies which included CT abd/pel, which showed pancreatitis.   Medications I ordered medication dilaudid and fluids for pain and treatment.  Reassessments After the interventions stated above, I reevaluated the patient and found feeling somewhat improved.  Consultants Appreciate Dr. Nevada Crane for admitting.  Plan Admit.    Final Clinical Impression(s) / ED Diagnoses Final diagnoses:  Acute pancreatitis, unspecified complication status, unspecified pancreatitis type    Rx / DC Orders ED Discharge Orders    None       Montine Circle, PA-C 08/22/20 0456    Veryl Speak, MD 08/22/20 [phone]

## 2020-08-22 NOTE — Progress Notes (Signed)
Hemodialysis rescheduled for tomorrow per Dr. Joylene Grapes due to needing HD catheter placement.

## 2020-08-22 NOTE — Procedures (Signed)
Interventional Radiology Procedure Note  Procedure: Tunneled HD catheter placement  Complications: None  Estimated Blood Loss: < 10 mL  Findings: Right IJ tunneled Palindrome HD catheter placement; 23 cm tip to cuff. Tip in RA. OK to use.  Venetia Night. Kathlene Cote, M.D Pager:  [phone]

## 2020-08-22 NOTE — Progress Notes (Signed)
Chief Complaint: Patient was seen in consultation today for HD catheter placement  Referring Physician(s): Dr. Elmarie Shiley  Supervising Physician: Aletta Edouard  Patient Status: [facility] - In-pt  History of Present Illness: Craig Flynn is a 60 y.o. 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 He is in need of dialysis. He previously had an AVF created but it never worked. IR is asked to place tunneled HD cath. PMHx, meds, labs, imaging, allergies reviewed. Has been NPO today as directed.    Past Medical History:  Diagnosis Date  . Anemia 06/2015  . Arthritis    knee  . Cancer [facility])    thyroid cancer  . Cardiomyopathy ([place])    a. 10/2013: EF reduced to 35-40% b. 09/2014: EF improved to 55-60%, Grade 1 DD noted.  . Chest pain 06/2015  . CHF (congestive heart failure) ([place])   . CKD (chronic kidney disease), stage IV [facility])    sees  Dr Lorrene Reid  . Dyspnea    with exerion   . GERD (gastroesophageal reflux disease)   . Gout   . Hypertension   . Obesity   . Sleep apnea    not wearing c-pap now, needs new slep study per pt. (01/22/2017)  . Tuberculosis 1981   while the pt was in the service.  . Tubular adenoma of colon 10/2015  . Wears glasses     Past Surgical History:  Procedure Laterality Date  . AV FISTULA PLACEMENT Left 03/25/2016   Procedure: Creation of Left Arm ARTERIOVENOUS (AV) FISTULA;  Surgeon: Elam Dutch, MD;  Location: [facility] [place] OR;  Service: Vascular;  Laterality: Left;  . COLONOSCOPY W/ BIOPSIES AND POLYPECTOMY     "no problem"  . HERNIA REPAIR    . LAPAROSCOPIC CHOLECYSTECTOMY    . LIGATION OF COMPETING BRANCHES OF ARTERIOVENOUS FISTULA Left 01/25/2018   Procedure: LIGATION OF COMPETING BRANCHES And Revision of ARTERIOVENOUS FISTULA LEFT ARM.;  Surgeon: Elam Dutch, MD;  Location: [place];  Service: Vascular;  Laterality: Left;  . THYROIDECTOMY  01/22/2017  . THYROIDECTOMY Left 01/22/2017   Procedure: THYROIDECTOMY;  Surgeon: Melida Quitter, MD;  Location:  [place];  Service: ENT;  Laterality: Left;  . THYROIDECTOMY Right 03/26/2017   Procedure: COMPLETION OF THYROIDECTOMY;  Surgeon: Melida Quitter, MD;  Location: [place];  Service: ENT;  Laterality: Right;  . UMBILICAL HERNIA REPAIR    . WISDOM TOOTH EXTRACTION      Allergies: Patient has no known allergies.  Medications: Prior to Admission medications   Medication Sig Start Date End Date Taking? Authorizing Provider  acetaminophen (TYLENOL) 500 MG tablet Take 2 tablets (1,000 mg total) by mouth every 8 (eight) hours as needed for mild pain or headache. 05/05/18  Yes Shambley, Delphia Grates, NP  albuterol (VENTOLIN HFA) 108 (90 Base) MCG/ACT inhaler INHALE 2 PUFFS INTO THE LUNGS EVERY 6 HOURS AS NEEDED FOR WHEEZING OR SHORTNESS OF BREATH Patient taking differently: Inhale 2 puffs into the lungs every 6 (six) hours as needed for wheezing or shortness of breath. 08/01/20  Yes Biagio Borg, MD  allopurinol (ZYLOPRIM) 100 MG tablet Take 150 mg by mouth daily. Taking 1 & 1/2 tab= 150 mg 10/27/18  Yes [provider]  aspirin EC 81 MG tablet Take 81 mg by mouth daily.   Yes [provider]  calcium elemental as carbonate (BARIATRIC TUMS ULTRA) 400 MG chewable tablet Chew 1,000 mg by mouth as needed for heartburn.    Yes [provider]  carvedilol (COREG) 25 MG tablet TAKE 1 TABLET(25 MG) BY MOUTH TWICE DAILY Patient taking differently: Take 25 mg by mouth 2 (two) times daily with a meal. 03/20/20  Yes Biagio Borg, MD  cyclobenzaprine (FLEXERIL) 10 MG tablet Take 1 tablet (10 mg total) by mouth 2 (two) times daily as needed for muscle spasms. 07/31/20  Yes Ripley Fraise, MD  fenofibrate 54 MG tablet TAKE 1 TABLET BY MOUTH EVERY DAY Patient taking differently: Take 54 mg by mouth daily. 08/07/20  Yes Minus Breeding, MD  furosemide (LASIX) 80 MG tablet Take 1.5 tablets (120 mg total) by mouth See admin instructions. Take 120 mg by mouth in the morning and 120 mg in the afternoon  03/26/20  Yes Isla Pence, MD  hydrALAZINE (APRESOLINE) 25 MG tablet Take 25 mg by mouth in the morning and at bedtime. 07/31/19  Yes [provider]  iron polysaccharides (NIFEREX) 150 MG capsule Take 150 mg by mouth daily. 12/18/16  Yes [provider]  isosorbide mononitrate (IMDUR) 30 MG 24 hr tablet TAKE 2 TABLETS(60 MG) BY MOUTH DAILY Patient taking differently: Take 60 mg by mouth daily. TAKE 2 TABLETS(60 MG) BY MOUTH DAILY 01/15/20  Yes Minus Breeding, MD  levothyroxine (SYNTHROID) 137 MCG tablet Take 137 mcg by mouth every morning. 07/09/20  Yes [provider]  nitroGLYCERIN (NITROSTAT) 0.4 MG SL tablet DISSOLVE 1 TABLET UNDER THE TONGUE EVERY 5 MINUTES AS NEEDED FOR CHEST PAIN Patient taking differently: Place 0.4 mg under the tongue every 5 (five) minutes as needed for chest pain. 03/29/19  Yes Tami Lin, MD  pantoprazole (PROTONIX) 40 MG tablet Take 1 tablet (40 mg total) by mouth 2 (two) times daily before a meal. 11/29/19  Yes Biagio Borg, MD  Potassium Chloride ER 20 MEQ TBCR Take 20 mEq by mouth daily. 08/16/19  Yes [provider]  thiamine (VITAMIN B-1) 50 MG tablet Take 1 tablet (50 mg total) by mouth daily. 03/25/20  Yes Janith Lima, MD  calcitRIOL (ROCALTROL) 0.5 MCG capsule Take 0.5 mcg by mouth every Monday, Wednesday, and Friday. 12/10/18   [provider]  levothyroxine (SYNTHROID) 100 MCG tablet Take 1 tablet (100 mcg total) by mouth daily. Patient not taking: No sig reported 03/24/20   Janith Lima, MD  sodium bicarbonate 650 MG tablet Take 1 tablet (650 mg total) by mouth 3 (three) times daily. Patient not taking: Reported on 08/22/2020 05/26/19   Biagio Borg, MD     Family History  Problem Relation Age of Onset  . Stroke Mother   . Pneumonia Father        died of Pneumonia x3  . Kidney disease Maternal Grandmother   . Hypertension Maternal Grandmother   . Healthy Maternal Grandfather   . Healthy Paternal  Grandmother   . Healthy Paternal Grandfather   . CAD Neg Hx   . Colon cancer Neg Hx   . Esophageal cancer Neg Hx   . Pancreatic cancer Neg Hx   . Prostate cancer Neg Hx   . Stomach cancer Neg Hx   . Rectal cancer Neg Hx     Social History   Socioeconomic History  . Marital status: Divorced    Spouse name: Not on file  . Number of children: 2  . Years of education: 72  . Highest education level: Not on file  Occupational History  . Occupation: Disability    Comment: Knees   Tobacco Use  . Smoking status: Current  Every Day Smoker    Packs/day: 0.50    Years: 40.00    Pack years: 20.00    Types: Cigarettes  . Smokeless tobacco: Never Used  Vaping Use  . Vaping Use: Never used  Substance and Sexual Activity  . Alcohol use: Not Currently    Alcohol/week: 0.0 standard drinks  . Drug use: No  . Sexual activity: Yes  Other Topics Concern  . Not on file  Social History Narrative   Fun: Golf, basketball    Denies religious beliefs effecting health care.    Social Determinants of Health   Financial Resource Strain: Low Risk   . Difficulty of Paying Living Expenses: Not hard at all  Food Insecurity: No Food Insecurity  . Worried About Charity fundraiser in the Last Year: Never true  . Ran Out of Food in the Last Year: Never true  Transportation Needs: No Transportation Needs  . Lack of Transportation (Medical): No  . Lack of Transportation (Non-Medical): No  Physical Activity: Sufficiently Active  . Days of Exercise per Week: 5 days  . Minutes of Exercise per Session: 30 min  Stress: No Stress Concern Present  . Feeling of Stress : Not at all  Social Connections: Moderately Integrated  . Frequency of Communication with Friends and Family: More than three times a week  . Frequency of Social Gatherings with Friends and Family: More than three times a week  . Attends Religious Services: More than 4 times per year  . Active Member of Clubs or Organizations: Yes  .  Attends Archivist Meetings: More than 4 times per year  . Marital Status: Never married    Review of Systems: A 12 point ROS discussed and pertinent positives are indicated in the HPI above.  All other systems are negative.  Review of Systems  Vital Signs: BP (!) 165/93 (BP Location: Right Arm)   Pulse 65   Temp 97.7 F (36.5 C) (Oral)   Resp 16   Ht 6\' 2"  (1.88 m)   Wt 126 kg   SpO2 99%   BMI 35.66 kg/m   Physical Exam Constitutional:      Appearance: He is well-developed. He is not ill-appearing.  HENT:     Mouth/Throat:     Mouth: Mucous membranes are moist.     Pharynx: Oropharynx is clear.  Cardiovascular:     Rate and Rhythm: Normal rate and regular rhythm.     Heart sounds: Normal heart sounds.  Pulmonary:     Effort: Pulmonary effort is normal. No respiratory distress.     Breath sounds: Normal breath sounds.  Abdominal:     Palpations: Abdomen is soft.  Skin:    General: Skin is warm and dry.  Neurological:     General: No focal deficit present.     Mental Status: He is alert.  Psychiatric:        Mood and Affect: Mood normal.        Thought Content: Thought content normal.        Judgment: Judgment normal.      Imaging: CT ABDOMEN PELVIS WO CONTRAST  Result Date: 08/21/2020 CLINICAL DATA:  Abdominal pain, nausea and vomiting for 2 days, tenderness to palpation EXAM: CT ABDOMEN AND PELVIS WITHOUT CONTRAST TECHNIQUE: Multidetector CT imaging of the abdomen and pelvis was performed following the standard protocol without IV contrast. COMPARISON:  03/26/2020 FINDINGS: Lower chest: No acute pleural or parenchymal lung disease. Hepatobiliary: Gallbladder is surgically absent.  Unenhanced imaging of the liver demonstrates no focal abnormality. No biliary duct dilation. Pancreas: Significant inflammatory changes surround the head and body of the pancreas, consistent with acute pancreatitis. No evidence of fluid collection, pseudocyst, or abscess. Spleen:  Normal in size without focal abnormality. Adrenals/Urinary Tract: The adrenals are stable. Bilateral renal cortical atrophy unchanged. No urinary tract calculi or obstructive uropathy. Bladder is decompressed which limits its evaluation. Stomach/Bowel: No bowel obstruction or ileus. Normal appendix right lower quadrant. No bowel wall thickening or inflammatory change. Vascular/Lymphatic: Aortic atherosclerosis. No enlarged abdominal or pelvic lymph nodes. Reproductive: Prostate is unremarkable. Other: Trace mesenteric free fluid within the upper abdomen related to pancreatitis. No free gas. Stable small umbilical hernia. Musculoskeletal: No acute or destructive bony lesions. Reconstructed images demonstrate no additional findings. IMPRESSION: 1. Acute uncomplicated pancreatitis. No fluid collection, pseudocyst, or abscess. 2. Stable small umbilical hernia. 3.  Aortic Atherosclerosis (ICD10-I70.0). Electronically Signed   By: Randa Ngo M.D.   On: 08/21/2020 23:22   DG Chest 2 View  Result Date: 07/31/2020 CLINICAL DATA:  Chest pain EXAM: CHEST - 2 VIEW COMPARISON:  06/06/2020 FINDINGS: Normal heart size and mediastinal contours. No acute infiltrate or edema. No effusion or pneumothorax. No acute osseous findings. IMPRESSION: No active cardiopulmonary disease. Electronically Signed   By: Monte Fantasia M.D.   On: 07/31/2020 04:42    Labs:  CBC: Recent Labs    06/06/20 1341 07/31/20 0415 08/21/20 2241 08/22/20 0506 08/22/20 0621  WBC 6.1 6.8 8.9 9.1  --   HGB 9.8* 9.7* 10.8* 9.9* 10.5*  HCT 30.4* 30.0* 32.2* 30.8* 31.0*  PLT 195 200 256 191  --     COAGS: No results for input(s): INR, APTT in the last 8760 hours.  BMP: Recent Labs    11/06/19 0014 02/03/20 1637 06/06/20 1341 07/31/20 0415 08/21/20 2241 08/22/20 0506 08/22/20 0621  NA 140   < > 136 127* 128* 129* 131*  K 4.2   < > 4.2 3.5 3.5 3.8 3.8  CL 102   < > 96* 94* 84* 94*  --   CO2 25   < > 29 21* 19* 20*  --    GLUCOSE 103*   < > 97 99 130* 109*  --   BUN 71*   < > 72* 108* 153* 154*  --   CALCIUM 9.7   < > 10.8* 11.4* 12.6* 11.7*  --   CREATININE 6.10*   < > 10.16* 11.07* 15.56* 15.48*  --   GFRNONAA 9*   < > 5* 5* 3* 3*  --   GFRAA 11*  --   --   --   --   --   --    < > = values in this interval not displayed.    LIVER FUNCTION TESTS: Recent Labs    03/17/20 0041 03/26/20 0602 08/21/20 2241 08/22/20 0506  BILITOT 0.7 0.6 0.8 0.6  AST 26 27 19 18   ALT 35 39 19 15  ALKPHOS 25* 21* 33* 28*  PROT 6.6 6.1* 7.2 6.5  ALBUMIN 4.2 3.9 4.4 3.9    TUMOR MARKERS: No results for input(s): AFPTM, CEA, CA199, CHROMGRNA in the last 8760 hours.  Assessment and Plan: Progressive CKD in need of dialysis. Plan for placement of tunneled HD catheter Labs reviewed. Risks and benefits of image guided tunneled hemodialysis catheter placement was discussed with the patient including, but not limited to bleeding, infection, pneumothorax, or fibrin sheath development and need for additional procedures.  All of the patient's questions were answered, patient is agreeable to proceed. Consent signed and in chart.    Thank you for this interesting consult.  I greatly enjoyed meeting TIERRA DIVELBISS and look forward to participating in their care.  A copy of this report was sent to the requesting provider on this date.  Electronically Signed: Ascencion Dike, PA-C 08/22/2020, 12:02 PM   I spent a total of 20 minutes in face to face in clinical consultation, greater than 50% of which was counseling/coordinating care for tunneled HD catheter.

## 2020-08-22 NOTE — ED Notes (Signed)
Patient transported to IR 

## 2020-08-22 NOTE — Consult Note (Signed)
Reason for Consult: Uremic symptoms in patient with new end-stage renal disease Referring Physician: Vernell Leep M.D. [facility])  HPI:  60 year old African-American man with past medical history significant for chronic kidney disease stage V secondary to underlying hypertensive nephrosclerosis, hypertension, coronary artery disease, diastolic dysfunction, morbid obesity, obstructive sleep apnea, dyslipidemia, thyroidectomy for follicular cancer and gout.  He presented to the emergency room yesterday with a 1 day history of progressively worsening nausea, vomiting and periumbilical pain after being seen on 08/20/2020 at [facility] for concerns of progressively worsening weakness, dizziness and headache with associated anorexia and weight loss.  The decision was made at that time to initiate dialysis with respect to his worsening GFR/azotemia with plans to set him up for outpatient hemodialysis catheter placement/dialysis.  He was found to have acute pancreatitis by CT scan and lab criteria (lipase 1378) and is being admitted for additional management.  He complains of feeling poorly and is willing to undertake hemodialysis.  Denies any preceding fever or chills or cough/sputum production.  Denies any dysuria, urgency, frequency, flank pain or hematuria.  He previously had an attempted left brachiocephalic fistula creation 4 years ago with poor maturation that did not improve with branch ligation/elevation-this suffered primary failure.  Past Medical History:  Diagnosis Date  . Anemia 06/2015  . Arthritis    knee  . Cancer [facility])    thyroid cancer  . Cardiomyopathy ([place])    a. 10/2013: EF reduced to 35-40% b. 09/2014: EF improved to 55-60%, Grade 1 DD noted.  . Chest pain 06/2015  . CHF (congestive heart failure) ([place])   . CKD (chronic kidney disease), stage IV [facility])    sees  Dr Lorrene Reid  . Dyspnea    with exerion   . GERD (gastroesophageal reflux disease)   . Gout   . Hypertension    . Obesity   . Sleep apnea    not wearing c-pap now, needs new slep study per pt. (01/22/2017)  . Tuberculosis 1981   while the pt was in the service.  . Tubular adenoma of colon 10/2015  . Wears glasses     Past Surgical History:  Procedure Laterality Date  . AV FISTULA PLACEMENT Left 03/25/2016   Procedure: Creation of Left Arm ARTERIOVENOUS (AV) FISTULA;  Surgeon: Elam Dutch, MD;  Location: [facility] OR;  Service: Vascular;  Laterality: Left;  . COLONOSCOPY W/ BIOPSIES AND POLYPECTOMY     "no problem"  . HERNIA REPAIR    . LAPAROSCOPIC CHOLECYSTECTOMY    . LIGATION OF COMPETING BRANCHES OF ARTERIOVENOUS FISTULA Left 01/25/2018   Procedure: LIGATION OF COMPETING BRANCHES And Revision of ARTERIOVENOUS FISTULA LEFT ARM.;  Surgeon: Elam Dutch, MD;  Location: [place];  Service: Vascular;  Laterality: Left;  . THYROIDECTOMY  01/22/2017  . THYROIDECTOMY Left 01/22/2017   Procedure: THYROIDECTOMY;  Surgeon: Melida Quitter, MD;  Location: [place];  Service: ENT;  Laterality: Left;  . THYROIDECTOMY Right 03/26/2017   Procedure: COMPLETION OF THYROIDECTOMY;  Surgeon: Melida Quitter, MD;  Location: [place];  Service: ENT;  Laterality: Right;  . UMBILICAL HERNIA REPAIR    . WISDOM TOOTH EXTRACTION      Family History  Problem Relation Age of Onset  . Stroke Mother   . Pneumonia Father        died of Pneumonia x3  . Kidney disease Maternal Grandmother   . Hypertension Maternal Grandmother   . Healthy Maternal Grandfather   . Healthy Paternal Grandmother   . Healthy  Paternal Grandfather   . CAD Neg Hx   . Colon cancer Neg Hx   . Esophageal cancer Neg Hx   . Pancreatic cancer Neg Hx   . Prostate cancer Neg Hx   . Stomach cancer Neg Hx   . Rectal cancer Neg Hx     Social History:  reports that he has been smoking cigarettes. He has a 20.00 pack-year smoking history. He has never used smokeless tobacco. He reports previous alcohol use. He reports that he does not use drugs.  Allergies: No  Known Allergies  Medications:  Scheduled: . allopurinol  100 mg Oral Daily  . Chlorhexidine Gluconate Cloth  6 each Topical Q0600  . heparin  5,000 Units Subcutaneous Q8H  . levothyroxine  137 mcg Oral Q0600  . ondansetron (ZOFRAN) IV  4 mg Intravenous Once  . senna-docusate  1 tablet Oral QHS  . sodium bicarbonate  650 mg Oral TID    BMP Latest Ref Rng & Units 08/22/2020 08/22/2020 08/21/2020  Glucose 70 - 99 mg/dL - 109(H) 130(H)  BUN 6 - 20 mg/dL - 154(H) 153(H)  Creatinine 0.61 - 1.24 mg/dL - 15.48(H) 15.56(H)  Sodium 135 - 145 mmol/L 131(L) 129(L) 128(L)  Potassium 3.5 - 5.1 mmol/L 3.8 3.8 3.5  Chloride 98 - 111 mmol/L - 94(L) 84(L)  CO2 22 - 32 mmol/L - 20(L) 19(L)  Calcium 8.9 - 10.3 mg/dL - 11.7(H) 12.6(H)   CBC Latest Ref Rng & Units 08/22/2020 08/22/2020 08/21/2020  WBC 4.0 - 10.5 K/uL - 9.1 8.9  Hemoglobin 13.0 - 17.0 g/dL 10.5(L) 9.9(L) 10.8(L)  Hematocrit 39.0 - 52.0 % 31.0(L) 30.8(L) 32.2(L)  Platelets 150 - 400 K/uL - 191 256    CT ABDOMEN PELVIS WO CONTRAST  Result Date: 08/21/2020 CLINICAL DATA:  Abdominal pain, nausea and vomiting for 2 days, tenderness to palpation EXAM: CT ABDOMEN AND PELVIS WITHOUT CONTRAST TECHNIQUE: Multidetector CT imaging of the abdomen and pelvis was performed following the standard protocol without IV contrast. COMPARISON:  03/26/2020 FINDINGS: Lower chest: No acute pleural or parenchymal lung disease. Hepatobiliary: Gallbladder is surgically absent. Unenhanced imaging of the liver demonstrates no focal abnormality. No biliary duct dilation. Pancreas: Significant inflammatory changes surround the head and body of the pancreas, consistent with acute pancreatitis. No evidence of fluid collection, pseudocyst, or abscess. Spleen: Normal in size without focal abnormality. Adrenals/Urinary Tract: The adrenals are stable. Bilateral renal cortical atrophy unchanged. No urinary tract calculi or obstructive uropathy. Bladder is decompressed which limits its  evaluation. Stomach/Bowel: No bowel obstruction or ileus. Normal appendix right lower quadrant. No bowel wall thickening or inflammatory change. Vascular/Lymphatic: Aortic atherosclerosis. No enlarged abdominal or pelvic lymph nodes. Reproductive: Prostate is unremarkable. Other: Trace mesenteric free fluid within the upper abdomen related to pancreatitis. No free gas. Stable small umbilical hernia. Musculoskeletal: No acute or destructive bony lesions. Reconstructed images demonstrate no additional findings. IMPRESSION: 1. Acute uncomplicated pancreatitis. No fluid collection, pseudocyst, or abscess. 2. Stable small umbilical hernia. 3.  Aortic Atherosclerosis (ICD10-I70.0). Electronically Signed   By: Randa Ngo M.D.   On: 08/21/2020 23:22    Review of Systems  Constitutional: Positive for activity change, appetite change and fatigue.  HENT: Negative for hearing loss, nosebleeds, sinus pressure and sore throat.   Eyes: Negative for photophobia, redness and visual disturbance.  Respiratory: Negative for cough, chest tightness and shortness of breath.   Cardiovascular: Negative for chest pain and leg swelling.  Gastrointestinal: Positive for abdominal pain, nausea and vomiting. Negative for blood  in stool and diarrhea.  Endocrine: Negative for polydipsia and polyuria.  Genitourinary: Negative for dysuria, flank pain, hematuria and urgency.  Musculoskeletal: Positive for back pain. Negative for gait problem and myalgias.  Skin: Negative for rash and wound.  Neurological: Positive for weakness and light-headedness. Negative for dizziness.   Blood pressure (!) 166/76, pulse 64, temperature 97.7 F (36.5 C), temperature source Oral, resp. rate 16, height 6\' 2"  (1.88 m), weight 126 kg, SpO2 100 %. Physical Exam Vitals and nursing note reviewed.  Constitutional:      General: He is not in acute distress.    Appearance: He is well-developed. He is obese.  HENT:     Head: Normocephalic and  atraumatic.     Mouth/Throat:     Mouth: Mucous membranes are moist.     Pharynx: Oropharynx is clear.  Eyes:     Extraocular Movements: Extraocular movements intact.  Cardiovascular:     Rate and Rhythm: Normal rate and regular rhythm.     Heart sounds: Normal heart sounds. No murmur heard.   Pulmonary:     Effort: Pulmonary effort is normal.     Breath sounds: Normal breath sounds. No wheezing or rales.  Abdominal:     General: Abdomen is protuberant. Bowel sounds are normal.     Palpations: Abdomen is soft.     Tenderness: There is abdominal tenderness in the epigastric area. There is guarding.  Musculoskeletal:     Right lower leg: No edema.     Left lower leg: No edema.  Skin:    General: Skin is warm and dry.  Neurological:     Mental Status: He is alert and oriented to person, place, and time.  Psychiatric:        Mood and Affect: Mood normal.        Behavior: Behavior normal.     Assessment/Plan: 1.  Acute pancreatitis: Continue ongoing management with n.p.o. status, intravenous fluids and pain management. 2.  End-stage renal disease: With underlying progressive chronic kidney disease and now development of assorted uremic symptoms prompting initiation of dialysis.  I will request for interventional radiology to assess him and place a tunneled hemodialysis catheter and thereafter consult vascular surgery for for dialysis access planning when this episode appears to be under better control/resolved. 3.  Anemia of chronic disease: We will continue to follow hemoglobin/hematocrit trend closely to decide on need for intravenous iron/ESA. 4.  Chronic kidney disease/metabolic bone disease: Noted to be hypercalcemic which indeed may be a risk factor for acute pancreatitis.  Discontinue calcitriol and will start him on noncalcium containing binders when able to tolerate oral intake.  Will use low calcium dialysate. 5.  Hypertension: Monitor with ongoing pain management and  hemodialysis/ultrafiltration.  Layken Doenges K. 08/22/2020, 10:25 AM

## 2020-08-22 NOTE — ED Notes (Signed)
RN attempted report x2 

## 2020-08-22 NOTE — ED Notes (Signed)
Lab to add on triglycerides  

## 2020-08-22 NOTE — ED Notes (Signed)
RN attempted report x1.  

## 2020-08-22 NOTE — Progress Notes (Signed)
New ESRD patient per Dr. Patel/Nephrologist. Navigator has initiated outpatient HD referral with [facility]missions, which will be completed once patient has TDC placed and HepB labs drawn/resulted.  Navigator will followup with patient once he has been transferred from the ED to a room and is feeling better/more information about clinic available to accommodate a new patient.   Alphonzo Cruise, [place] Renal Navigator [phone]

## 2020-08-22 NOTE — H&P (Addendum)
History and Physical  Craig Flynn ZYS:[id number] DOB: 25-Dec-1960 DOA: 08/21/2020  Referring physician: Marlon Pel, [place] PCP: Biagio Borg, MD  Outpatient Specialists: Nephrology. Patient coming from: Home.  Chief Complaint: Periumbilical pain, nausea, vomiting x1 day  HPI: Craig Flynn is a 60 y.o. male with medical history significant for prior cholecystectomy, CKD V, hypothyroidism, essential hypertension, GERD, gout, who presented to [facility] ED due to complaints of severe periumbilical pain of 1 day duration.  Associated with nausea vomiting and constipation.  He took milk of magnesia at home and was able to have a bowel movement while in the ED.  States he vomited all day yesterday.  Work-up in the ED revealed acute pancreatitis as seen with lipase level greater than 1200 and CT scan evidence of acute pancreatitis.  Also electrolyte derangement or moderate hypercalcemia, anion gap metabolic acidosis and worsening renal function.  Started on IV fluids and pain control in the ED.  EDP requested admission.  TRH, hospitalist team, asked to admit.  ED Course:  Afebrile.  BP 96/48, pulse 69, respiratory 17, O2 saturation 100% on room air.  Lab studies remarkable for serum sodium 128, serum bicarb 19, BUN 153, creatinine 15.56, GFR 3.  Calcium 12.6, lipase 1200.  Triglyceride 162.  WBC 8.9, hemoglobin 10.8.  Review of Systems: Review of systems as noted in the HPI. All other systems reviewed and are negative.   Past Medical History:  Diagnosis Date  . Anemia 06/2015  . Arthritis    knee  . Cancer [facility])    thyroid cancer  . Cardiomyopathy ([place])    a. 10/2013: EF reduced to 35-40% b. 09/2014: EF improved to 55-60%, Grade 1 DD noted.  . Chest pain 06/2015  . CHF (congestive heart failure) ([place])   . CKD (chronic kidney disease), stage IV [facility])    sees  Dr Lorrene Reid  . Dyspnea    with exerion   . GERD (gastroesophageal reflux disease)   . Gout   . Hypertension   . Obesity   . Sleep apnea     not wearing c-pap now, needs new slep study per pt. (01/22/2017)  . Tuberculosis 1981   while the pt was in the service.  . Tubular adenoma of colon 10/2015  . Wears glasses    Past Surgical History:  Procedure Laterality Date  . AV FISTULA PLACEMENT Left 03/25/2016   Procedure: Creation of Left Arm ARTERIOVENOUS (AV) FISTULA;  Surgeon: Elam Dutch, MD;  Location: [facility] OR;  Service: Vascular;  Laterality: Left;  . COLONOSCOPY W/ BIOPSIES AND POLYPECTOMY     "no problem"  . HERNIA REPAIR    . LAPAROSCOPIC CHOLECYSTECTOMY    . LIGATION OF COMPETING BRANCHES OF ARTERIOVENOUS FISTULA Left 01/25/2018   Procedure: LIGATION OF COMPETING BRANCHES And Revision of ARTERIOVENOUS FISTULA LEFT ARM.;  Surgeon: Elam Dutch, MD;  Location: [place];  Service: Vascular;  Laterality: Left;  . THYROIDECTOMY  01/22/2017  . THYROIDECTOMY Left 01/22/2017   Procedure: THYROIDECTOMY;  Surgeon: Melida Quitter, MD;  Location: [place];  Service: ENT;  Laterality: Left;  . THYROIDECTOMY Right 03/26/2017   Procedure: COMPLETION OF THYROIDECTOMY;  Surgeon: Melida Quitter, MD;  Location: [place];  Service: ENT;  Laterality: Right;  . UMBILICAL HERNIA REPAIR    . WISDOM TOOTH EXTRACTION      Social History:  reports that he has been smoking cigarettes. He has a 20.00 pack-year smoking history. He has never used smokeless tobacco. He reports previous alcohol  use. He reports that he does not use drugs.   No Known Allergies  Family History  Problem Relation Age of Onset  . Stroke Mother   . Pneumonia Father        died of Pneumonia x3  . Kidney disease Maternal Grandmother   . Hypertension Maternal Grandmother   . Healthy Maternal Grandfather   . Healthy Paternal Grandmother   . Healthy Paternal Grandfather   . CAD Neg Hx   . Colon cancer Neg Hx   . Esophageal cancer Neg Hx   . Pancreatic cancer Neg Hx   . Prostate cancer Neg Hx   . Stomach cancer Neg Hx   . Rectal cancer Neg Hx       Prior to Admission  medications   Medication Sig Start Date End Date Taking? Authorizing Provider  acetaminophen (TYLENOL) 500 MG tablet Take 2 tablets (1,000 mg total) by mouth every 8 (eight) hours as needed for mild pain or headache. 05/05/18   Lance Sell, NP  albuterol (VENTOLIN HFA) 108 (90 Base) MCG/ACT inhaler INHALE 2 PUFFS INTO THE LUNGS EVERY 6 HOURS AS NEEDED FOR WHEEZING OR SHORTNESS OF BREATH 08/01/20   Biagio Borg, MD  allopurinol (ZYLOPRIM) 100 MG tablet Take 150 mg by mouth daily. Taking 1 & 1/2 tab= 150 mg 10/27/18   [provider]  aspirin EC 81 MG tablet Take 81 mg by mouth daily.    [provider]  calcitRIOL (ROCALTROL) 0.5 MCG capsule Take 0.5 mcg by mouth every Monday, Wednesday, and Friday. 12/10/18   [provider]  calcium elemental as carbonate (BARIATRIC TUMS ULTRA) 400 MG chewable tablet Chew 1,000 mg by mouth as needed for heartburn.     [provider]  carvedilol (COREG) 25 MG tablet TAKE 1 TABLET(25 MG) BY MOUTH TWICE DAILY 03/20/20   Biagio Borg, MD  cyclobenzaprine (FLEXERIL) 10 MG tablet Take 1 tablet (10 mg total) by mouth 2 (two) times daily as needed for muscle spasms. 07/31/20   Ripley Fraise, MD  fenofibrate 54 MG tablet TAKE 1 TABLET BY MOUTH EVERY DAY 08/07/20   Minus Breeding, MD  furosemide (LASIX) 80 MG tablet Take 1.5 tablets (120 mg total) by mouth See admin instructions. Take 120 mg by mouth in the morning and 120 mg in the afternoon 03/26/20   Isla Pence, MD  hydrALAZINE (APRESOLINE) 25 MG tablet Take 25 mg by mouth in the morning and at bedtime. 07/31/19   [provider]  iron polysaccharides (NIFEREX) 150 MG capsule Take 150 mg by mouth daily. 12/18/16   [provider]  isosorbide mononitrate (IMDUR) 30 MG 24 hr tablet TAKE 2 TABLETS(60 MG) BY MOUTH DAILY Patient taking differently: Take 60 mg by mouth daily. TAKE 2 TABLETS(60 MG) BY MOUTH DAILY 01/15/20   Minus Breeding, MD  levothyroxine  (SYNTHROID) 100 MCG tablet Take 1 tablet (100 mcg total) by mouth daily. 03/24/20   Janith Lima, MD  nitroGLYCERIN (NITROSTAT) 0.4 MG SL tablet DISSOLVE 1 TABLET UNDER THE TONGUE EVERY 5 MINUTES AS NEEDED FOR CHEST PAIN Patient taking differently: Place 0.4 mg under the tongue every 5 (five) minutes as needed for chest pain. 03/29/19   Tami Lin, MD  pantoprazole (PROTONIX) 40 MG tablet Take 1 tablet (40 mg total) by mouth 2 (two) times daily before a meal. 11/29/19   Biagio Borg, MD  Potassium Chloride ER 20 MEQ TBCR Take 20 mEq by mouth daily. 08/16/19   [provider]  sodium bicarbonate 650 MG tablet Take 1 tablet (650 mg total) by mouth 3 (three) times daily. 05/26/19   Biagio Borg, MD  thiamine (VITAMIN B-1) 50 MG tablet Take 1 tablet (50 mg total) by mouth daily. 03/25/20   Janith Lima, MD    Physical Exam: BP (!) 96/48 (BP Location: Right Arm)   Pulse 69   Temp 97.7 F (36.5 C) (Oral)   Resp 17   Ht 6\' 2"  (1.88 m)   Wt 126 kg   SpO2 100%   BMI 35.66 kg/m   . General: 60 y.o. year-old male well developed well nourished in no acute distress.  Alert and oriented x3. . Cardiovascular: Regular rate and rhythm with no rubs or gallops.  No thyromegaly or JVD noted.  No lower extremity edema. 2/4 pulses in all 4 extremities. Marland Kitchen Respiratory: Clear to auscultation with no wheezes or rales. Good inspiratory effort. . Abdomen: Soft obese, periumbilicus tenderness with palpation.  Nondistended with normal bowel sounds x4 quadrants. . Muskuloskeletal: No cyanosis, clubbing or edema noted bilaterally . Neuro: CN II-XII intact, strength, sensation, reflexes . Skin: No ulcerative lesions noted or rashes . Psychiatry: Judgement and insight appear normal. Mood is appropriate for condition and setting          Labs on Admission:  Basic Metabolic Panel: Recent Labs  Lab 08/21/20 2241  NA 128*  K 3.5  CL 84*  CO2 19*  GLUCOSE 130*  BUN 153*  CREATININE 15.56*   CALCIUM 12.6*   Liver Function Tests: Recent Labs  Lab 08/21/20 2241  AST 19  ALT 19  ALKPHOS 33*  BILITOT 0.8  PROT 7.2  ALBUMIN 4.4   Recent Labs  Lab 08/21/20 2241  LIPASE 1,206*   No results for input(s): AMMONIA in the last 168 hours. CBC: Recent Labs  Lab 08/21/20 2241  WBC 8.9  NEUTROABS 8.0*  HGB 10.8*  HCT 32.2*  MCV 81.9  PLT 256   Cardiac Enzymes: No results for input(s): CKTOTAL, CKMB, CKMBINDEX, TROPONINI in the last 168 hours.  BNP (last 3 results) No results for input(s): BNP in the last 8760 hours.  ProBNP (last 3 results) No results for input(s): PROBNP in the last 8760 hours.  CBG: No results for input(s): GLUCAP in the last 168 hours.  Radiological Exams on Admission: CT ABDOMEN PELVIS WO CONTRAST  Result Date: 08/21/2020 CLINICAL DATA:  Abdominal pain, nausea and vomiting for 2 days, tenderness to palpation EXAM: CT ABDOMEN AND PELVIS WITHOUT CONTRAST TECHNIQUE: Multidetector CT imaging of the abdomen and pelvis was performed following the standard protocol without IV contrast. COMPARISON:  03/26/2020 FINDINGS: Lower chest: No acute pleural or parenchymal lung disease. Hepatobiliary: Gallbladder is surgically absent. Unenhanced imaging of the liver demonstrates no focal abnormality. No biliary duct dilation. Pancreas: Significant inflammatory changes surround the head and body of the pancreas, consistent with acute pancreatitis. No evidence of fluid collection, pseudocyst, or abscess. Spleen: Normal in size without focal abnormality. Adrenals/Urinary Tract: The adrenals are stable. Bilateral renal cortical atrophy unchanged. No urinary tract calculi or obstructive uropathy. Bladder is decompressed which limits its evaluation. Stomach/Bowel: No bowel obstruction or ileus. Normal appendix right lower quadrant. No bowel wall thickening or inflammatory change. Vascular/Lymphatic: Aortic atherosclerosis. No enlarged abdominal or pelvic lymph nodes.  Reproductive: Prostate is unremarkable. Other: Trace mesenteric free fluid within the upper abdomen related to pancreatitis. No free gas. Stable small umbilical hernia. Musculoskeletal: No acute or destructive bony lesions. Reconstructed images  demonstrate no additional findings. IMPRESSION: 1. Acute uncomplicated pancreatitis. No fluid collection, pseudocyst, or abscess. 2. Stable small umbilical hernia. 3.  Aortic Atherosclerosis (ICD10-I70.0). Electronically Signed   By: Randa Ngo M.D.   On: 08/21/2020 23:22    EKG: I independently viewed the EKG done and my findings are as followed: None available at time of this visit.  Assessment/Plan Present on Admission: . Acute pancreatitis  Active Problems:   Acute pancreatitis  Acute pancreatitis seen on CT scan without contrast, unclear cause, postcholecystectomy. Presented with abdominal pain, nausea, vomiting, elevated lipase level 1200, evidence of acute pancreatitis on CT scan. No evidence of abscess or pseudocyst History of prior cholecystectomy. No evidence of biliary duct dilatation Triglyceride level 162 but not in the thousand Continue IV fluid hydration Continue pain control and bowel regimen Repeat lipase level in the morning.  AKI on CKD V Last creatinine 11.07 with GFR of 5 Presented with creatinine of 15.56 with GFR of 3. Follows with nephrology outpatient Routine nephrology consult, will see in the morning. Patient still makes urine Monitor urine output Avoid nephrotoxic agents, dehydration and hypotension. Repeat renal panel in the morning.  High anion gap metabolic acidosis in the setting of worsening renal failure. Serum bicarb 19, anion gap 25 Isotonic bicarb. Nephrology consult in the morning  Moderate hypercalcemia, likely contributed to constipation and nausea. Serum calcium 12.6, albumin 4.4 Continue IV fluid, isotonic bicarb, IV fluid administered judiciously due to renal failure IV Lasix if BP  permits. Defer IV bisphosphonates, calcitonin or pamidronate to nephrology Patient reports taking lots of Tums for acid reflux Nephro consulted, Dr. [name].  Chronic constipation, suspect contributed by hypercalcemia. Treat underlying condition Bowel regimen in place.  Hypotension history of hypertension Hold off home oral antihypertensives for now Last BP had a MAP of 59. Maintain MAP greater than 65. Closely monitor vital signs.  Anemia of chronic disease in the setting of advanced CKD Hemoglobin stable at 10.8 from 9.7 previously. No overt bleeding Continue to monitor H&H.  Hypothyroidism Resume home levothyroxine.  GERD Resume home PPI.  Gout Resume home allopurinol.     DVT prophylaxis: Subcu heparin 3 times daily.  Code Status: Full code.  Family Communication: None at bedside.  Disposition Plan: Admit to telemetry medical.  Consults called: Nephrology.  Admission status: Inpatient status.  Patient will require at least 2 midnights for further evaluation and treatment of present condition.   Status is: Inpatient    Dispo:  Patient From: Home  Planned Disposition: Home, possibly on 08/24/2020.  Medically stable for discharge: No         Kayleen Memos MD [facility] Pager [phone]  If 7PM-7AM, please contact night-coverage [email or web address] Password [facility]  08/22/2020, 4:44 AM

## 2020-08-22 NOTE — Progress Notes (Signed)
PROGRESS NOTE   Craig Flynn  JKD:[id number]    DOB: 11-Jan-1961    DOA: 08/21/2020  PCP: Biagio Borg, MD   I have briefly reviewed patients previous medical [facility] in [facility].  Chief Complaint  Patient presents with  . Abdominal Pain  . Emesis  . Nausea  . Hernia    Brief Narrative:  60 year old male with medical history significant for and not limited to prior cholecystectomy ("10-15 years ago"), CKD 5 (with plan for HD access and starting HD soon), thyroid cancer/post thyroidectomy hypothyroidism, essential hypertension, GERD, gout, anemia, chronic diastolic CHF who presented to ED with 1 day history of severe periumbilical pain with associated nausea, vomiting and constipation.  Admitted for acute pancreatitis without complicating features and progression to ESRD with early uremic symptoms.  Nephrology consulted.   Assessment & Plan:  Active Problems:   Acute pancreatitis   Acute pancreatitis: Lipase 1378 on admission.  LFTs unremarkable.  CT abdomen and pelvis showed acute uncomplicated pancreatitis without fluid collection, pseudocyst or abscess.  S/p remote cholecystectomy.  TG: 162.  No history of alcohol use, recent medication changes or new OTC medication use for insect bites.  Denies prior episodes of pancreatitis.  Etiology unclear.?  Idiopathic.  Treat supportively with bowel rest/Will initiate clear liquid diet and advance diet as tolerated, IV fluids and monitor for improvement.  Consider outpatient GI consultation (has seen Dr. Layne Benton [facility]) for EUS.  New ESRD with early uremia/high anion gap metabolic acidosis: Presented with creatinine >15.  Reports following with Dr. [name], nephrology with plans for HD port placement sometime this week and initiation of HD next week.  Nephrology consultation appreciated: Have consulted IR for placement of tunneled HD catheter and eventually vascular surgery consultation for dialysis access.  Likely to start HD soon.   Currently on bicarb drip.  Hypercalcemia: Unclear etiology.  Presented with serum calcium of 12.6 which has come down to 11.7 post hydration.  Could be related to dehydration versus other etiologies.  Continue IV fluids and follow BMP in a.m.  Avoid Tums which patient reported taking lots of for acid reflux.  Chronic constipation: Aggressive bowel regimen.  Essential hypertension: Had soft blood pressures earlier on in admission, now blood pressures trending up.  Monitor closely and consider resuming antihypertensives.  Anemia in chronic kidney disease: Hemoglobin stable in the 9-10 range.  Post thyroidectomy hypothyroidism: Continue levothyroxine.  GERD: PPI  Gout: No acute flare.  Continue allopurinol.  Body mass index is 35.66 kg/m.    DVT prophylaxis: heparin injection 5,000 Units Start: 08/22/20 2200 SCDs Start: 08/22/20 0444     Code Status: Full Code Family Communication: None at bedside. Disposition:  Status is: Inpatient  Remains inpatient appropriate because:Inpatient level of care appropriate due to severity of illness   Dispo:  Patient From: Home  Planned Disposition: Home  Medically stable for discharge: No          Consultants:   Nephrology Interventional radiology  Procedures:   None  Antimicrobials:    Anti-infectives (From admission, onward)   Start     Dose/Rate Route Frequency Ordered Stop   08/22/20 1300  ceFAZolin (ANCEF) IVPB 2g/100 mL premix        2 g 200 mL/hr over 30 Minutes Intravenous  Once 08/22/20 1251          Subjective:  Seen this morning while still in ED hallway bed.  Reported abdominal pain had improved after pain medications earlier in the morning.  Denies prior episodes of pancreatitis.  No other complaints reported  Objective:   Vitals:   08/21/20 2240 08/22/20 0057 08/22/20 0820 08/22/20 1144  BP: 111/63 (!) 96/48 (!) 166/76 (!) 165/93  Pulse: 68 69 64 65  Resp: 16 17 16 16   Temp: 97.6 F (36.4 C) 97.7 F  (36.5 C)    TempSrc:  Oral    SpO2: 99% 100% 100% 99%  Weight:      Height:        General exam: Young male, moderately built and nourished lying comfortably supine in bed. Respiratory system: Clear to auscultation. Respiratory effort normal. Cardiovascular system: S1 & S2 heard, RRR. No JVD, murmurs, rubs, gallops or clicks. No pedal edema. Gastrointestinal system: Abdomen is nondistended, soft.  Mild periumbilical/epigastric area tenderness without rigidity, guarding or rebound. No organomegaly or masses felt. Normal bowel sounds heard. Central nervous system: Alert and oriented. No focal neurological deficits. Extremities: Symmetric 5 x 5 power. Skin: No rashes, lesions or ulcers Psychiatry: Judgement and insight appear normal. Mood & affect appropriate.     Data Reviewed:   I have personally reviewed following labs and imaging studies   CBC: Recent Labs  Lab 08/21/20 2241 08/22/20 0506 08/22/20 0621  WBC 8.9 9.1  --   NEUTROABS 8.0*  --   --   HGB 10.8* 9.9* 10.5*  HCT 32.2* 30.8* 31.0*  MCV 81.9 84.2  --   PLT 256 191  --     Basic Metabolic Panel: Recent Labs  Lab 08/21/20 2241 08/22/20 0506 08/22/20 0621  NA 128* 129* 131*  K 3.5 3.8 3.8  CL 84* 94*  --   CO2 19* 20*  --   GLUCOSE 130* 109*  --   BUN 153* 154*  --   CREATININE 15.56* 15.48*  --   CALCIUM 12.6* 11.7*  --   MG  --  2.0  --   PHOS  --  7.1*  --     Liver Function Tests: Recent Labs  Lab 08/21/20 2241 08/22/20 0506  AST 19 18  ALT 19 15  ALKPHOS 33* 28*  BILITOT 0.8 0.6  PROT 7.2 6.5  ALBUMIN 4.4 3.9    CBG: No results for input(s): GLUCAP in the last 168 hours.  Microbiology Studies:   Recent Results (from the past 240 hour(s))  Resp Panel by RT-PCR (Flu A&B, Covid) Nasopharyngeal Swab     Status: None   Collection Time: 08/22/20  2:55 AM   Specimen: Nasopharyngeal Swab; Nasopharyngeal(NP) swabs in vial transport medium  Result Value Ref Range Status   SARS Coronavirus  2 by RT PCR NEGATIVE NEGATIVE Final    Comment: (NOTE) SARS-CoV-2 target nucleic acids are NOT DETECTED.  The SARS-CoV-2 RNA is generally detectable in upper respiratory specimens during the acute phase of infection. The lowest concentration of SARS-CoV-2 viral copies this assay can detect is 138 copies/mL. A negative result does not preclude SARS-Cov-2 infection and should not be used as the sole basis for treatment or other patient management decisions. A negative result may occur with  improper specimen collection/handling, submission of specimen other than nasopharyngeal swab, presence of viral mutation(s) within the areas targeted by this assay, and inadequate number of viral copies(<138 copies/mL). A negative result must be combined with clinical observations, patient history, and epidemiological information. The expected result is Negative.  Fact Sheet for Patients:  EntrepreneurPulse.com.au  Fact Sheet for Healthcare Providers:  IncredibleEmployment.be  This test is no t yet approved or cleared  by the [place] and  has been authorized for detection and/or diagnosis of SARS-CoV-2 by FDA under an Emergency Use Authorization (EUA). This EUA will remain  in effect (meaning this test can be used) for the duration of the COVID-19 declaration under Section 564(b)(1) of the Act, 21 U.S.C.section 360bbb-3(b)(1), unless the authorization is terminated  or revoked sooner.       Influenza A by PCR NEGATIVE NEGATIVE Final   Influenza B by PCR NEGATIVE NEGATIVE Final    Comment: (NOTE) The Xpert Xpress SARS-CoV-2/FLU/RSV plus assay is intended as an aid in the diagnosis of influenza from Nasopharyngeal swab specimens and should not be used as a sole basis for treatment. Nasal washings and aspirates are unacceptable for Xpert Xpress SARS-CoV-2/FLU/RSV testing.  Fact Sheet for Patients: EntrepreneurPulse.com.au  Fact  Sheet for Healthcare Providers: IncredibleEmployment.be  This test is not yet approved or cleared by the [place] FDA and has been authorized for detection and/or diagnosis of SARS-CoV-2 by FDA under an Emergency Use Authorization (EUA). This EUA will remain in effect (meaning this test can be used) for the duration of the COVID-19 declaration under Section 564(b)(1) of the Act, 21 U.S.C. section 360bbb-3(b)(1), unless the authorization is terminated or revoked.  Performed at [facility], [place] [address] [place], [place] [address]      Radiology Studies:  CT ABDOMEN PELVIS WO CONTRAST  Result Date: 08/21/2020 CLINICAL DATA:  Abdominal pain, nausea and vomiting for 2 days, tenderness to palpation EXAM: CT ABDOMEN AND PELVIS WITHOUT CONTRAST TECHNIQUE: Multidetector CT imaging of the abdomen and pelvis was performed following the standard protocol without IV contrast. COMPARISON:  03/26/2020 FINDINGS: Lower chest: No acute pleural or parenchymal lung disease. Hepatobiliary: Gallbladder is surgically absent. Unenhanced imaging of the liver demonstrates no focal abnormality. No biliary duct dilation. Pancreas: Significant inflammatory changes surround the head and body of the pancreas, consistent with acute pancreatitis. No evidence of fluid collection, pseudocyst, or abscess. Spleen: Normal in size without focal abnormality. Adrenals/Urinary Tract: The adrenals are stable. Bilateral renal cortical atrophy unchanged. No urinary tract calculi or obstructive uropathy. Bladder is decompressed which limits its evaluation. Stomach/Bowel: No bowel obstruction or ileus. Normal appendix right lower quadrant. No bowel wall thickening or inflammatory change. Vascular/Lymphatic: Aortic atherosclerosis. No enlarged abdominal or pelvic lymph nodes. Reproductive: Prostate is unremarkable. Other: Trace mesenteric free fluid within the upper abdomen related to pancreatitis. No free gas.  Stable small umbilical hernia. Musculoskeletal: No acute or destructive bony lesions. Reconstructed images demonstrate no additional findings. IMPRESSION: 1. Acute uncomplicated pancreatitis. No fluid collection, pseudocyst, or abscess. 2. Stable small umbilical hernia. 3.  Aortic Atherosclerosis (ICD10-I70.0). Electronically Signed   By: Randa Ngo M.D.   On: 08/21/2020 23:22     Scheduled Meds:   . allopurinol  100 mg Oral Daily  . Chlorhexidine Gluconate Cloth  6 each Topical Q0600  . heparin  5,000 Units Subcutaneous Q8H  . levothyroxine  137 mcg Oral Q0600  . ondansetron (ZOFRAN) IV  4 mg Intravenous Once  . senna-docusate  1 tablet Oral QHS    Continuous Infusions:   .  ceFAZolin (ANCEF) IV 2 g (08/22/20 1340)  . sodium bicarbonate in 0.45 NS mL infusion 75 mL/hr at 08/22/20 0740     LOS: 0 days     Vernell Leep, MD, [place], [facility]. [facility]    To contact the attending provider between 7A-7P or the covering provider during after hours 7P-7A, please log into the web  site [email or web address] and access using universal  password for that web site. If you do not have the password, please call the hospital operator.  08/22/2020, 2:06 PM

## 2020-08-23 ENCOUNTER — Encounter (HOSPITAL_COMMUNITY): Payer: Medicare HMO

## 2020-08-23 DIAGNOSIS — N179 Acute kidney failure, unspecified: Secondary | ICD-10-CM

## 2020-08-23 DIAGNOSIS — N186 End stage renal disease: Secondary | ICD-10-CM

## 2020-08-23 DIAGNOSIS — R935 Abnormal findings on diagnostic imaging of other abdominal regions, including retroperitoneum: Secondary | ICD-10-CM

## 2020-08-23 DIAGNOSIS — K859 Acute pancreatitis without necrosis or infection, unspecified: Secondary | ICD-10-CM

## 2020-08-23 DIAGNOSIS — R1013 Epigastric pain: Secondary | ICD-10-CM

## 2020-08-23 DIAGNOSIS — K8500 Idiopathic acute pancreatitis without necrosis or infection: Secondary | ICD-10-CM

## 2020-08-23 LAB — CBC
HCT: 28.4 % — ABNORMAL LOW (ref 39.0–52.0)
HCT: 30.6 % — ABNORMAL LOW (ref 39.0–52.0)
Hemoglobin: 9.3 g/dL — ABNORMAL LOW (ref 13.0–17.0)
Hemoglobin: 10.3 g/dL — ABNORMAL LOW (ref 13.0–17.0)
MCH: 26.7 pg (ref 26.0–34.0)
MCH: 27.3 pg (ref 26.0–34.0)
MCHC: 32.7 g/dL (ref 30.0–36.0)
MCHC: 33.7 g/dL (ref 30.0–36.0)
MCV: 81.6 fL (ref 80.0–100.0)
MCV: 81.2 fL (ref 80.0–100.0)
Platelets: 165 10*3/uL (ref 150–400)
Platelets: 175 10*3/uL (ref 150–400)
RBC: 3.48 MIL/uL — ABNORMAL LOW (ref 4.22–5.81)
RBC: 3.77 MIL/uL — ABNORMAL LOW (ref 4.22–5.81)
RDW: 12.4 % (ref 11.5–15.5)
RDW: 12.4 % (ref 11.5–15.5)
WBC: 8.8 10*3/uL (ref 4.0–10.5)
WBC: 8.8 10*3/uL (ref 4.0–10.5)
nRBC: 0 % (ref 0.0–0.2)
nRBC: 0 % (ref 0.0–0.2)

## 2020-08-23 LAB — HEPATITIS B SURFACE ANTIBODY,QUALITATIVE: Hep B S Ab: REACTIVE — AB

## 2020-08-23 LAB — COMPREHENSIVE METABOLIC PANEL
ALT: 15 U/L (ref 0–44)
AST: 16 U/L (ref 15–41)
Albumin: 3.5 g/dL (ref 3.5–5.0)
Alkaline Phosphatase: 28 U/L — ABNORMAL LOW (ref 38–126)
Anion gap: 16 — ABNORMAL HIGH (ref 5–15)
BUN: 156 mg/dL — ABNORMAL HIGH (ref 6–20)
CO2: 22 mmol/L (ref 22–32)
Calcium: 10.6 mg/dL — ABNORMAL HIGH (ref 8.9–10.3)
Chloride: 93 mmol/L — ABNORMAL LOW (ref 98–111)
Creatinine, Ser: 15.53 mg/dL — ABNORMAL HIGH (ref 0.61–1.24)
GFR, Estimated: 3 mL/min — ABNORMAL LOW (ref >60)
Glucose, Bld: 83 mg/dL (ref 70–99)
Potassium: 3.7 mmol/L (ref 3.5–5.1)
Sodium: 131 mmol/L — ABNORMAL LOW (ref 135–145)
Total Bilirubin: 0.6 mg/dL (ref 0.3–1.2)
Total Protein: 6.1 g/dL — ABNORMAL LOW (ref 6.5–8.1)

## 2020-08-23 LAB — LIPASE, BLOOD: Lipase: 1294 U/L — ABNORMAL HIGH (ref 11–51)

## 2020-08-23 LAB — HEPATITIS B CORE ANTIBODY, TOTAL: Hep B Core Total Ab: NONREACTIVE

## 2020-08-23 LAB — HEPATITIS B SURFACE ANTIGEN: Hepatitis B Surface Ag: NONREACTIVE

## 2020-08-23 MED ORDER — HEPARIN SODIUM (PORCINE) 1000 UNIT/ML DIALYSIS
40.0000 [IU]/kg | INTRAMUSCULAR | Status: DC | PRN
  Filled 2020-08-23: qty 5

## 2020-08-23 MED ORDER — BISACODYL 10 MG RE SUPP
10.0000 mg | Freq: Once | RECTAL | Status: AC
  Administered 2020-08-23: 10 mg via RECTAL
  Filled 2020-08-23: qty 1

## 2020-08-23 MED ORDER — SODIUM CHLORIDE 0.9 % IV SOLN: INTRAVENOUS | Status: DC

## 2020-08-23 MED ORDER — BISACODYL 5 MG PO TBEC
5.0000 mg | DELAYED_RELEASE_TABLET | Freq: Every day | ORAL | Status: DC | PRN
  Administered 2020-08-24 – 2020-08-25 (×3): 5 mg via ORAL
  Filled 2020-08-23 (×5): qty 1

## 2020-08-23 MED ORDER — PENTAFLUOROPROP-TETRAFLUOROETH EX AERO: 1.0000 "application " | INHALATION_SPRAY | CUTANEOUS | Status: DC | PRN

## 2020-08-23 MED ORDER — SODIUM CHLORIDE 0.9 % IV SOLN: 100.0000 mL | INTRAVENOUS | Status: DC | PRN

## 2020-08-23 MED ORDER — SENNOSIDES-DOCUSATE SODIUM 8.6-50 MG PO TABS
2.0000 | ORAL_TABLET | Freq: Every day | ORAL | Status: DC
  Administered 2020-08-23 – 2020-08-24 (×2): 2 via ORAL
  Filled 2020-08-23 (×2): qty 2

## 2020-08-23 MED ORDER — POLYETHYLENE GLYCOL 3350 17 G PO PACK
17.0000 g | PACK | Freq: Every day | ORAL | Status: DC
  Administered 2020-08-23 – 2020-08-24 (×2): 17 g via ORAL
  Filled 2020-08-23 (×2): qty 1

## 2020-08-23 MED ORDER — HEPARIN SODIUM (PORCINE) 1000 UNIT/ML IJ SOLN
INTRAMUSCULAR | Status: AC
  Administered 2020-08-23: 1000 [IU]
  Filled 2020-08-23: qty 4

## 2020-08-23 MED ORDER — LIDOCAINE-PRILOCAINE 2.5-2.5 % EX CREA: 1.0000 "application " | TOPICAL_CREAM | CUTANEOUS | Status: DC | PRN

## 2020-08-23 MED ORDER — LIDOCAINE HCL (PF) 1 % IJ SOLN: 5.0000 mL | INTRAMUSCULAR | Status: DC | PRN

## 2020-08-23 MED ORDER — HEPARIN SODIUM (PORCINE) 1000 UNIT/ML DIALYSIS: 1000.0000 [IU] | INTRAMUSCULAR | Status: DC | PRN

## 2020-08-23 NOTE — Consult Note (Addendum)
Hospital Consult    Reason for Consult: Permanent dialysis access Requesting Physician: Dr. Posey Pronto MRN #:  [id number]  History of Present Illness: This is a 60 y.o. male who is currently admitted to the hospital due to pancreatitis and acute renal failure.  He is being seen in consultation for evaluation for permanent dialysis access placement.  Surgical history significant for left brachiocephalic fistula and 0086 which subsequently required sidebranch ligation in 2019.  Ultimately the fistula failed.  Patient had tunneled dialysis catheter placed by interventional radiology yesterday and is scheduled for his first HD treatment today.  Patient is right arm dominant and would like to avoid access placement in his right arm at all costs at this time.  Vein mapping is pending however mapping from 1 year ago demonstrated adequate left basilic fistula for conduit use.  He does not have a permanent pacemaker.  He is not on any blood thinners.  Past Medical History:  Diagnosis Date  . Anemia 06/2015  . Arthritis    knee  . Cancer [facility])    thyroid cancer  . Cardiomyopathy ([place])    a. 10/2013: EF reduced to 35-40% b. 09/2014: EF improved to 55-60%, Grade 1 DD noted.  . Chest pain 06/2015  . CHF (congestive heart failure) ([place])   . CKD (chronic kidney disease), stage IV [facility])    sees  Dr Lorrene Reid  . Dyspnea    with exerion   . GERD (gastroesophageal reflux disease)   . Gout   . Hypertension   . Obesity   . Sleep apnea    not wearing c-pap now, needs new slep study per pt. (01/22/2017)  . Tuberculosis 1981   while the pt was in the service.  . Tubular adenoma of colon 10/2015  . Wears glasses     Past Surgical History:  Procedure Laterality Date  . AV FISTULA PLACEMENT Left 03/25/2016   Procedure: Creation of Left Arm ARTERIOVENOUS (AV) FISTULA;  Surgeon: Elam Dutch, MD;  Location: [facility] OR;  Service: Vascular;  Laterality: Left;  . COLONOSCOPY W/ BIOPSIES AND POLYPECTOMY     "no problem"   . HERNIA REPAIR    . IR FLUORO GUIDE CV LINE RIGHT  08/22/2020  . IR US GUIDE VASC ACCESS RIGHT  08/22/2020  . LAPAROSCOPIC CHOLECYSTECTOMY    . LIGATION OF COMPETING BRANCHES OF ARTERIOVENOUS FISTULA Left 01/25/2018   Procedure: LIGATION OF COMPETING BRANCHES And Revision of ARTERIOVENOUS FISTULA LEFT ARM.;  Surgeon: Elam Dutch, MD;  Location: [facility];  Service: Vascular;  Laterality: Left;  . THYROIDECTOMY  01/22/2017  . THYROIDECTOMY Left 01/22/2017   Procedure: THYROIDECTOMY;  Surgeon: Melida Quitter, MD;  Location: [facility];  Service: ENT;  Laterality: Left;  . THYROIDECTOMY Right 03/26/2017   Procedure: COMPLETION OF THYROIDECTOMY;  Surgeon: Melida Quitter, MD;  Location: [facility];  Service: ENT;  Laterality: Right;  . UMBILICAL HERNIA REPAIR    . WISDOM TOOTH EXTRACTION      No Known Allergies  Prior to Admission medications   Medication Sig Start Date End Date Taking? Authorizing Provider  acetaminophen (TYLENOL) 500 MG tablet Take 2 tablets (1,000 mg total) by mouth every 8 (eight) hours as needed for mild pain or headache. 05/05/18  Yes Shambley, Delphia Grates, NP  albuterol (VENTOLIN HFA) 108 (90 Base) MCG/ACT inhaler INHALE 2 PUFFS INTO THE LUNGS EVERY 6 HOURS AS NEEDED FOR WHEEZING OR SHORTNESS OF BREATH Patient taking differently: Inhale 2 puffs into the lungs every 6 (six) hours  as needed for wheezing or shortness of breath. 08/01/20  Yes Biagio Borg, MD  allopurinol (ZYLOPRIM) 100 MG tablet Take 150 mg by mouth daily. Taking 1 & 1/2 tab= 150 mg 10/27/18  Yes [provider]  aspirin EC 81 MG tablet Take 81 mg by mouth daily.   Yes [provider]  calcium elemental as carbonate (BARIATRIC TUMS ULTRA) 400 MG chewable tablet Chew 1,000 mg by mouth as needed for heartburn.    Yes [provider]  carvedilol (COREG) 25 MG tablet TAKE 1 TABLET(25 MG) BY MOUTH TWICE DAILY Patient taking differently: Take 25 mg by mouth 2 (two) times daily with a meal. 03/20/20  Yes  Biagio Borg, MD  cyclobenzaprine (FLEXERIL) 10 MG tablet Take 1 tablet (10 mg total) by mouth 2 (two) times daily as needed for muscle spasms. 07/31/20  Yes Ripley Fraise, MD  fenofibrate 54 MG tablet TAKE 1 TABLET BY MOUTH EVERY DAY Patient taking differently: Take 54 mg by mouth daily. 08/07/20  Yes Minus Breeding, MD  furosemide (LASIX) 80 MG tablet Take 1.5 tablets (120 mg total) by mouth See admin instructions. Take 120 mg by mouth in the morning and 120 mg in the afternoon 03/26/20  Yes Isla Pence, MD  hydrALAZINE (APRESOLINE) 25 MG tablet Take 25 mg by mouth in the morning and at bedtime. 07/31/19  Yes [provider]  iron polysaccharides (NIFEREX) 150 MG capsule Take 150 mg by mouth daily. 12/18/16  Yes [provider]  isosorbide mononitrate (IMDUR) 30 MG 24 hr tablet TAKE 2 TABLETS(60 MG) BY MOUTH DAILY Patient taking differently: Take 60 mg by mouth daily. TAKE 2 TABLETS(60 MG) BY MOUTH DAILY 01/15/20  Yes Minus Breeding, MD  levothyroxine (SYNTHROID) 137 MCG tablet Take 137 mcg by mouth every morning. 07/09/20  Yes [provider]  nitroGLYCERIN (NITROSTAT) 0.4 MG SL tablet DISSOLVE 1 TABLET UNDER THE TONGUE EVERY 5 MINUTES AS NEEDED FOR CHEST PAIN Patient taking differently: Place 0.4 mg under the tongue every 5 (five) minutes as needed for chest pain. 03/29/19  Yes Tami Lin, MD  pantoprazole (PROTONIX) 40 MG tablet Take 1 tablet (40 mg total) by mouth 2 (two) times daily before a meal. 11/29/19  Yes Biagio Borg, MD  Potassium Chloride ER 20 MEQ TBCR Take 20 mEq by mouth daily. 08/16/19  Yes [provider]  thiamine (VITAMIN B-1) 50 MG tablet Take 1 tablet (50 mg total) by mouth daily. 03/25/20  Yes Janith Lima, MD  calcitRIOL (ROCALTROL) 0.5 MCG capsule Take 0.5 mcg by mouth every Monday, Wednesday, and Friday. 12/10/18   [provider]  levothyroxine (SYNTHROID) 100 MCG tablet Take 1 tablet (100 mcg total) by mouth  daily. Patient not taking: No sig reported 03/24/20   Janith Lima, MD  sodium bicarbonate 650 MG tablet Take 1 tablet (650 mg total) by mouth 3 (three) times daily. Patient not taking: Reported on 08/22/2020 05/26/19   Biagio Borg, MD    Social History   Socioeconomic History  . Marital status: Divorced    Spouse name: Not on file  . Number of children: 2  . Years of education: 52  . Highest education level: Not on file  Occupational History  . Occupation: Disability    Comment: Knees   Tobacco Use  . Smoking status: Current Every Day Smoker    Packs/day: 0.50    Years: 40.00    Pack years: 20.00    Types: Cigarettes  .  Smokeless tobacco: Never Used  Vaping Use  . Vaping Use: Never used  Substance and Sexual Activity  . Alcohol use: Not Currently    Alcohol/week: 0.0 standard drinks  . Drug use: No  . Sexual activity: Yes  Other Topics Concern  . Not on file  Social History Narrative   Fun: Golf, basketball    Denies religious beliefs effecting health care.    Social Determinants of Health   Financial Resource Strain: Low Risk   . Difficulty of Paying Living Expenses: Not hard at all  Food Insecurity: No Food Insecurity  . Worried About Charity fundraiser in the Last Year: Never true  . Ran Out of Food in the Last Year: Never true  Transportation Needs: No Transportation Needs  . Lack of Transportation (Medical): No  . Lack of Transportation (Non-Medical): No  Physical Activity: Sufficiently Active  . Days of Exercise per Week: 5 days  . Minutes of Exercise per Session: 30 min  Stress: No Stress Concern Present  . Feeling of Stress : Not at all  Social Connections: Moderately Integrated  . Frequency of Communication with Friends and Family: More than three times a week  . Frequency of Social Gatherings with Friends and Family: More than three times a week  . Attends Religious Services: More than 4 times per year  . Active Member of Clubs or Organizations: Yes   . Attends Archivist Meetings: More than 4 times per year  . Marital Status: Never married  Intimate Partner Violence: Not on file     Family History  Problem Relation Age of Onset  . Stroke Mother   . Pneumonia Father        died of Pneumonia x3  . Kidney disease Maternal Grandmother   . Hypertension Maternal Grandmother   . Healthy Maternal Grandfather   . Healthy Paternal Grandmother   . Healthy Paternal Grandfather   . CAD Neg Hx   . Colon cancer Neg Hx   . Esophageal cancer Neg Hx   . Pancreatic cancer Neg Hx   . Prostate cancer Neg Hx   . Stomach cancer Neg Hx   . Rectal cancer Neg Hx     ROS: Otherwise negative unless mentioned in HPI  Physical Examination  Vitals:   08/23/20 0252 08/23/20 0540  BP: 136/75 137/74  Pulse: 69 73  Resp: 19 18  Temp: 98.2 F (36.8 C) 98 F (36.7 C)  SpO2: 100% 100%   Body mass index is 35.66 kg/m.  General:  WDWN in NAD Gait: Not observed HENT: WNL, normocephalic Pulmonary: normal non-labored breathing Cardiac: regular Abdomen:  soft, NT/ND, no masses Skin: without rashes Vascular Exam/Pulses: Symmetrical radial pulses Extremities: without ischemic changes, without Gangrene , without cellulitis; without open wounds;  Musculoskeletal: no muscle wasting or atrophy  Neurologic: A&O X 3;  No focal weakness or paresthesias are detected; speech is fluent/normal Psychiatric:  The pt has Normal affect. Lymph:  Unremarkable  CBC    Component Value Date/Time   WBC 8.8 08/23/2020 0054   RBC 3.48 (L) 08/23/2020 0054   HGB 9.3 (L) 08/23/2020 0054   HCT 28.4 (L) 08/23/2020 0054   PLT 165 08/23/2020 0054   MCV 81.6 08/23/2020 0054   MCH 26.7 08/23/2020 0054   MCHC 32.7 08/23/2020 0054   RDW 12.4 08/23/2020 0054   LYMPHSABS 0.3 (L) 08/21/2020 2241   MONOABS 0.4 08/21/2020 2241   EOSABS 0.1 08/21/2020 2241   BASOSABS 0.0 08/21/2020 2241  BMET    Component Value Date/Time   NA 131 (L) 08/23/2020 0054   K  3.7 08/23/2020 0054   CL 93 (L) 08/23/2020 0054   CO2 22 08/23/2020 0054   GLUCOSE 83 08/23/2020 0054   GLUCOSE 130 (H) 03/03/2006 0910   BUN 156 (H) 08/23/2020 0054   CREATININE 15.53 (H) 08/23/2020 0054   CREATININE 4.09 (H) 09/12/2015 1529   CALCIUM 10.6 (H) 08/23/2020 0054   GFRNONAA 3 (L) 08/23/2020 0054   GFRAA 11 (L) 11/06/2019 0014    COAGS: No results found for: INR, PROTIME   Non-Invasive Vascular Imaging:   Vein map pending    ASSESSMENT/PLAN: This is a 60 y.o. male now with end-stage renal disease requiring hemodialysis  -Prior dialysis access procedures include left brachiocephalic fistula creation with subsequent sidebranch ligation however this fistula ultimately failed -Patient is adamant about avoiding access placement and right arm and would prefer a graft in his left arm if there was not a suitable venous conduit; restrict left upper extremity -Vein mapping is pending -Patient is currently being treated for acute pancreatitis.  We will reevaluate the patient next week for possible left arm AV fistula creation versus graft placement -On-call vascular surgeon Dr. Oneida Alar will evaluate the patient later today and provide further treatment plans   Dagoberto Ligas PA-C [facility] [phone]  History and exam details as above.  Pt currently has right side HD tunneled catheter and is to start HD today.  Prior vein map 6/21 shows adequate left basilic and right cephalic basilic vein for fistula.  Will repeat vein map since it has been nearly a year since this.    2+ brachial radial pulse bilaterally.  Will follow up vein map results this weekend.  Tentatively left basilic vein fistula vs graft next Tuesday 08/27/20  Will follow  Ruta Hinds, MD [facility] [place] Office: [phone]

## 2020-08-23 NOTE — Progress Notes (Signed)
Renal Navigator met with patient at bedside to introduce self and discuss outpatient HD referral. Patient was sitting up, pleasant and welcoming of visit. He says he is currently feeling ok and awaiting his first dialysis treatment today.  Navigator informed patient that outpatient referral has been started, with records already sent to [facility], but wanted to talk with him about options for clinic placement. Navigator recommends starting at the Transitional Care Unit (TCU) located at our [facility] on [address]. Navigator explained this as the ideal place for a new patient because he will get the most education and support. It is 4 days per week, with slightly shorter treatments, which ends up being gentler on the body starting out. Navigator explained that the education he will receive will give him the most information about modality options for dialysis and asked him if he has ever thought about doing dialysis at home, either HHD or PD. He reports that he has not, but that he lives alone and is not considering home therapy. Navigator explained that even if he does not wish to pursue home therapy, that the TCU is still the ideal place for a new HD patient to start. Patient thanked Navigator for the information, though it sounds like he is already familiar with the clinics in the area and asked for either "[facility]" or "[facility]." Navigator notes that "[facility]" also known as [employer]	[place] and [facility] on [address] are closest to his address. He comments that he would like to "start out where I am going to end up." Navigator agrees that this is a fine idea and will request [facility] or [facility] if a seat is available. Patient appreciative. Patient reports that he has a car and will drive himself. Navigator states recommendation to ask someone else to drive him to his appointments until he knows how he feels after HD treatments. He reports he will ask his son to take him. [facility] note  sent to [facility] Admissions. Navigator awaits HepB lab after it is drawn on treatment today.  Alphonzo Cruise, [place] Renal Navigator [phone]

## 2020-08-23 NOTE — Progress Notes (Signed)
PROGRESS NOTE   Craig Flynn  HYI:[id number]    DOB: 1961-03-19    DOA: 08/21/2020  PCP: Biagio Borg, MD   I have briefly reviewed patients previous medical records in [facility]  Chief Complaint  Patient presents with  . Abdominal Pain  . Emesis  . Nausea  . Hernia    Brief Narrative:  60 year old male with medical history significant for and not limited to prior cholecystectomy ("10-15 years ago"), CKD 5 (with plan for HD access and starting HD soon), thyroid cancer/post thyroidectomy hypothyroidism, essential hypertension, GERD, gout, anemia, chronic diastolic CHF who presented to ED with 1 day history of severe periumbilical pain with associated nausea, vomiting and constipation.  Admitted for acute pancreatitis without complicating features and progression to ESRD with early uremic symptoms.  Nephrology consulted, [facility] placed by IR, plans to start HD 6/3.  VVS consulted and plan permanent vascular access next week.  Due to persistent pain and markedly elevated lipase, [facility] consult.   Assessment & Plan:  Active Problems:   Acute pancreatitis   Acute pancreatitis: Lipase 1378 on admission.  LFTs unremarkable.  CT abdomen and pelvis showed acute uncomplicated pancreatitis without fluid collection, pseudocyst or abscess.  S/p remote cholecystectomy.  TG: 162.  No history of alcohol use, recent medication changes or new OTC medication use for insect bites.  Denies prior episodes of pancreatitis.  Etiology unclear.?  Idiopathic.  Treating supportively with bowel rest/Will advance diet as tolerated to soft, nonfatty diet, IV fluids for additional 24 hours (although new ESRD, not clinically volume overloaded and still has good urine output) and monitor for improvement.  GI input appreciated, continue supportive management, needs advanced imaging of pancreas in 6 to 8 weeks to rule out underlying lesion.  New ESRD with early uremia/high anion gap metabolic acidosis: Presented  with creatinine >15.  Follows with Dr. [name], nephrology with plans for HD port placement sometime this week and initiation of HD next week as OP.  Nephrology consulted, [facility] placed by IR, plans to start HD 6/3.  VVS consulted and plan permanent vascular access next week.  Hypercalcemia: Unclear etiology.  Presented with serum calcium of 12.6 which has come down to 11.7 post hydration.  Could be related to dehydration versus other etiologies.  Almost resolved with IV fluids.  Avoid Tums which patient reported taking lots of for acid reflux.  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  Trial of OTC laxatives and MOM at home without much effect.  Aggressive bowel regimen initiated.  Essential hypertension: Had soft blood pressures earlier on in admission, now blood pressures trending up.  Monitor closely and consider resuming antihypertensives.  Anemia in chronic kidney disease: Hemoglobin stable in the 9-10 range.  Post thyroidectomy hypothyroidism: Continue levothyroxine.  GERD: PPI  Gout: No acute flare.  Continue allopurinol.  Body mass index is 35.66 kg/m.    DVT prophylaxis: heparin injection 5,000 Units Start: 08/22/20 2200 SCDs Start: 08/22/20 0444     Code Status: Full Code Family Communication: None at bedside. Disposition:  Status is: Inpatient  Remains inpatient appropriate because:Inpatient level of care appropriate due to severity of illness   Dispo:  Patient From: Home  Planned Disposition: Home  Medically stable for discharge: No  Consultants:   Nephrology Interventional radiology  Procedures:   None  Antimicrobials:    Anti-infectives (From admission, onward)   Start     Dose/Rate Route Frequency  Ordered Stop   08/22/20 1300  ceFAZolin (ANCEF) IVPB 2g/100 mL premix        2 g 200 mL/hr over 30 Minutes Intravenous  Once 08/22/20 1251 08/22/20 1458        Subjective:  Still with intermittent periumbilical/mid abdomen pain, 7/10 in severity.  Tolerating diet without nausea or vomiting.  Feels constipated and asking for meds.  Passing flatus.  Last BM 2 days ago.  No dyspnea.  Reports making good amount of urine.  Objective:   Vitals:   08/22/20 1655 08/22/20 2052 08/23/20 0252 08/23/20 0540  BP: (!) 182/87 (!) 160/70 136/75 137/74  Pulse: 70 72 69 73  Resp: 20 18 19 18   Temp:  97.9 F (36.6 C) 98.2 F (36.8 C) 98 F (36.7 C)  TempSrc:  Oral Oral Oral  SpO2: 97% 96% 100% 100%  Weight:      Height:        General exam: Young male, moderately built and nourished, sitting up comfortably in bed. Respiratory system: Clear to auscultation.  No increased work of breathing. Cardiovascular system: S1 and S2 heard, RRR.  No JVD, murmurs or pedal edema.  Telemetry personally reviewed: Sinus rhythm with occasional PVCs. Gastrointestinal system: Abdomen is nondistended but obese, soft and nontender.  No organomegaly or masses appreciated.  Normal bowel sounds heard. Central nervous system: Alert and oriented. No focal neurological deficits. Extremities: Symmetric 5 x 5 power. Skin: Small area of subcutaneous nodularity on either side of right lower ankle, likely calciphylaxis. Psychiatry: Judgement and insight appear normal. Mood & affect appropriate.     Data Reviewed:   I have personally reviewed following labs and imaging studies   CBC: Recent Labs  Lab 08/21/20 2241 08/22/20 0506 08/22/20 0621 08/23/20 0054  WBC 8.9 9.1  --  8.8  NEUTROABS 8.0*  --   --   --   HGB 10.8* 9.9* 10.5* 9.3*  HCT 32.2* 30.8* 31.0* 28.4*  MCV 81.9 84.2  --  81.6  PLT 256 191  --  299    Basic Metabolic Panel: Recent Labs  Lab 08/21/20 2241 08/22/20 0506 08/22/20 0621 08/23/20 0054   NA 128* 129* 131* 131*  K 3.5 3.8 3.8 3.7  CL 84* 94*  --  93*  CO2 19* 20*  --  22  GLUCOSE 130* 109*  --  83  BUN 153* 154*  --  156*  CREATININE 15.56* 15.48*  --  15.53*  CALCIUM 12.6* 11.7*  --  10.6*  MG  --  2.0  --   --   PHOS  --  7.1*  --   --     Liver Function Tests: Recent Labs  Lab 08/21/20 2241 08/22/20 0506 08/23/20 0054  AST 19 18 16   ALT 19 15 15   ALKPHOS 33* 28* 28*  BILITOT 0.8 0.6 0.6  PROT 7.2 6.5 6.1*  ALBUMIN 4.4 3.9 3.5    CBG: No results for input(s): GLUCAP in the last 168 hours.  Microbiology Studies:   Recent Results (from the past 240 hour(s))  Resp Panel by RT-PCR (Flu A&B, Covid) Nasopharyngeal Swab     Status: None   Collection Time: 08/22/20  2:55 AM   Specimen: Nasopharyngeal Swab; Nasopharyngeal(NP) swabs in vial transport medium  Result Value Ref Range Status  SARS Coronavirus 2 by RT PCR NEGATIVE NEGATIVE Final    Comment: (NOTE) SARS-CoV-2 target nucleic acids are NOT DETECTED.  The SARS-CoV-2 RNA is generally detectable in upper respiratory specimens during the acute phase of infection. The lowest concentration of SARS-CoV-2 viral copies this assay can detect is 138 copies/mL. A negative result does not preclude SARS-Cov-2 infection and should not be used as the sole basis for treatment or other patient management decisions. A negative result may occur with  improper specimen collection/handling, submission of specimen other than nasopharyngeal swab, presence of viral mutation(s) within the areas targeted by this assay, and inadequate number of viral copies(<138 copies/mL). A negative result must be combined with clinical observations, patient history, and epidemiological information. The expected result is Negative.  Fact Sheet for Patients:  EntrepreneurPulse.com.au  Fact Sheet for Healthcare Providers:  IncredibleEmployment.be  This test is no t yet approved or cleared by the  [place] FDA and  has been authorized for detection and/or diagnosis of SARS-CoV-2 by FDA under an Emergency Use Authorization (EUA). This EUA will remain  in effect (meaning this test can be used) for the duration of the COVID-19 declaration under Section 564(b)(1) of the Act, 21 U.S.C.section 360bbb-3(b)(1), unless the authorization is terminated  or revoked sooner.       Influenza A by PCR NEGATIVE NEGATIVE Final   Influenza B by PCR NEGATIVE NEGATIVE Final    Comment: (NOTE) The Xpert Xpress SARS-CoV-2/FLU/RSV plus assay is intended as an aid in the diagnosis of influenza from Nasopharyngeal swab specimens and should not be used as a sole basis for treatment. Nasal washings and aspirates are unacceptable for Xpert Xpress SARS-CoV-2/FLU/RSV testing.  Fact Sheet for Patients: EntrepreneurPulse.com.au  Fact Sheet for Healthcare Providers: IncredibleEmployment.be  This test is not yet approved or cleared by the [place] FDA and has been authorized for detection and/or diagnosis of SARS-CoV-2 by FDA under an Emergency Use Authorization (EUA). This EUA will remain in effect (meaning this test can be used) for the duration of the COVID-19 declaration under Section 564(b)(1) of the Act, 21 U.S.C. section 360bbb-3(b)(1), unless the authorization is terminated or revoked.  Performed at [facility], [place] [address], [place], [place] [address]      Radiology Studies:  CT ABDOMEN PELVIS WO CONTRAST  Result Date: 08/21/2020 CLINICAL DATA:  Abdominal pain, nausea and vomiting for 2 days, tenderness to palpation EXAM: CT ABDOMEN AND PELVIS WITHOUT CONTRAST TECHNIQUE: Multidetector CT imaging of the abdomen and pelvis was performed following the standard protocol without IV contrast. COMPARISON:  03/26/2020 FINDINGS: Lower chest: No acute pleural or parenchymal lung disease. Hepatobiliary: Gallbladder is surgically absent. Unenhanced  imaging of the liver demonstrates no focal abnormality. No biliary duct dilation. Pancreas: Significant inflammatory changes surround the head and body of the pancreas, consistent with acute pancreatitis. No evidence of fluid collection, pseudocyst, or abscess. Spleen: Normal in size without focal abnormality. Adrenals/Urinary Tract: The adrenals are stable. Bilateral renal cortical atrophy unchanged. No urinary tract calculi or obstructive uropathy. Bladder is decompressed which limits its evaluation. Stomach/Bowel: No bowel obstruction or ileus. Normal appendix right lower quadrant. No bowel wall thickening or inflammatory change. Vascular/Lymphatic: Aortic atherosclerosis. No enlarged abdominal or pelvic lymph nodes. Reproductive: Prostate is unremarkable. Other: Trace mesenteric free fluid within the upper abdomen related to pancreatitis. No free gas. Stable small umbilical hernia. Musculoskeletal: No acute or destructive bony lesions. Reconstructed images demonstrate no additional findings. IMPRESSION: 1. Acute uncomplicated pancreatitis. No fluid collection, pseudocyst, or  abscess. 2. Stable small umbilical hernia. 3.  Aortic Atherosclerosis (ICD10-I70.0). Electronically Signed   By: Randa Ngo M.D.   On: 08/21/2020 23:22   IR Fluoro Guide CV Line Right  Result Date: 08/22/2020 CLINICAL DATA:  Chronic kidney disease, stage V, and need for tunneled hemodialysis catheter to begin immediate dialysis. EXAM: TUNNELED CENTRAL VENOUS HEMODIALYSIS CATHETER PLACEMENT WITH ULTRASOUND AND FLUOROSCOPIC GUIDANCE ANESTHESIA/SEDATION: 1.0 mg IV Versed; 25 mcg IV Fentanyl. Total Moderate Sedation Time:   21 minutes. The patient's level of consciousness and physiologic status were continuously monitored during the procedure by Radiology nursing. MEDICATIONS: The patient received 2 g of IV Ancef prior to the procedure. FLUOROSCOPY TIME:  6 seconds.  1.0 mGy. PROCEDURE: The procedure, risks, benefits, and alternatives  were explained to the patient. Questions regarding the procedure were encouraged and answered. The patient understands and consents to the procedure. A timeout was performed prior to initiating the procedure. The right neck and chest were prepped with chlorhexidine in a sterile fashion, and a sterile drape was applied covering the operative field. Maximum barrier sterile technique with sterile gowns and gloves were used for the procedure. Local anesthesia was provided with 1% lidocaine. Ultrasound was used to confirm patency of the right internal jugular vein. After creating a small venotomy incision, a 21 gauge needle was advanced into the right internal jugular vein under direct, real-time ultrasound guidance. Ultrasound image documentation was performed. After securing guidewire access, an 8 Fr dilator was placed. A J-wire was kinked to measure appropriate catheter length. A Palindrome tunneled hemodialysis catheter measuring 23 cm from tip to cuff was chosen for placement. This was tunneled in a retrograde fashion from the chest wall to the venotomy incision. At the venotomy, serial dilatation was performed and a 15 Fr peel-away sheath was placed over a guidewire. The catheter was then placed through the sheath and the sheath removed. Final catheter positioning was confirmed and documented with a fluoroscopic spot image. The catheter was aspirated, flushed with saline, and injected with appropriate volume heparin dwells. The venotomy incision was closed with subcuticular 4-0 Vicryl. Dermabond was applied to the incision. The catheter exit site was secured with 0-Prolene retention sutures. COMPLICATIONS: None.  No pneumothorax. FINDINGS: After catheter placement, the tip lies in the right atrium. The catheter aspirates normally and is ready for immediate use. IMPRESSION: Placement of tunneled hemodialysis catheter via the right internal jugular vein. The catheter tip lies in the right atrium. The catheter is  ready for immediate use. Electronically Signed   By: Aletta Edouard M.D.   On: 08/22/2020 17:10   IR US Guide Vasc Access Right  Result Date: 08/22/2020 CLINICAL DATA:  Chronic kidney disease, stage V, and need for tunneled hemodialysis catheter to begin immediate dialysis. EXAM: TUNNELED CENTRAL VENOUS HEMODIALYSIS CATHETER PLACEMENT WITH ULTRASOUND AND FLUOROSCOPIC GUIDANCE ANESTHESIA/SEDATION: 1.0 mg IV Versed; 25 mcg IV Fentanyl. Total Moderate Sedation Time:   21 minutes. The patient's level of consciousness and physiologic status were continuously monitored during the procedure by Radiology nursing. MEDICATIONS: The patient received 2 g of IV Ancef prior to the procedure. FLUOROSCOPY TIME:  6 seconds.  1.0 mGy. PROCEDURE: The procedure, risks, benefits, and alternatives were explained to the patient. Questions regarding the procedure were encouraged and answered. The patient understands and consents to the procedure. A timeout was performed prior to initiating the procedure. The right neck and chest were prepped with chlorhexidine in a sterile fashion, and a sterile drape was applied covering the operative field.  Maximum barrier sterile technique with sterile gowns and gloves were used for the procedure. Local anesthesia was provided with 1% lidocaine. Ultrasound was used to confirm patency of the right internal jugular vein. After creating a small venotomy incision, a 21 gauge needle was advanced into the right internal jugular vein under direct, real-time ultrasound guidance. Ultrasound image documentation was performed. After securing guidewire access, an 8 Fr dilator was placed. A J-wire was kinked to measure appropriate catheter length. A Palindrome tunneled hemodialysis catheter measuring 23 cm from tip to cuff was chosen for placement. This was tunneled in a retrograde fashion from the chest wall to the venotomy incision. At the venotomy, serial dilatation was performed and a 15 Fr peel-away sheath  was placed over a guidewire. The catheter was then placed through the sheath and the sheath removed. Final catheter positioning was confirmed and documented with a fluoroscopic spot image. The catheter was aspirated, flushed with saline, and injected with appropriate volume heparin dwells. The venotomy incision was closed with subcuticular 4-0 Vicryl. Dermabond was applied to the incision. The catheter exit site was secured with 0-Prolene retention sutures. COMPLICATIONS: None.  No pneumothorax. FINDINGS: After catheter placement, the tip lies in the right atrium. The catheter aspirates normally and is ready for immediate use. IMPRESSION: Placement of tunneled hemodialysis catheter via the right internal jugular vein. The catheter tip lies in the right atrium. The catheter is ready for immediate use. Electronically Signed   By: Aletta Edouard M.D.   On: 08/22/2020 17:10     Scheduled Meds:   . allopurinol  100 mg Oral Daily  . bisacodyl  10 mg Rectal Once  . Chlorhexidine Gluconate Cloth  6 each Topical Q0600  . heparin  5,000 Units Subcutaneous Q8H  . levothyroxine  137 mcg Oral Q0600  . polyethylene glycol  17 g Oral Daily  . senna-docusate  2 tablet Oral QHS    Continuous Infusions:   . sodium chloride 75 mL/hr at 08/23/20 1039     LOS: 1 day     Vernell Leep, MD, [place], [facility]. [facility]    To contact the attending provider between 7A-7P or the covering provider during after hours 7P-7A, please log into the web site [email or web address] and access using universal [place] password for that web site. If you do not have the password, please call the hospital operator.  08/23/2020, 2:46 PM

## 2020-08-23 NOTE — Progress Notes (Signed)
Patient ID: Craig Flynn, male   DOB: 1960/12/14, 60 y.o.   MRN: [id number] [facility] Progress Note   Assessment/ Plan:   1.  Acute pancreatitis: Continue ongoing management with n.p.o. status, intravenous fluids and pain management. 2.  End-stage renal disease: With underlying progressive chronic kidney disease and now development of assorted uremic symptoms prompting initiation of dialysis.    I appreciate assistance from radiology with placement of a tunneled hemodialysis catheter and he will undertake his first dialysis today.  I will consult vascular surgery and obtain vein mapping for permanent access planning.  Process initiated for outpatient dialysis unit placement. 3.  Anemia of chronic disease: We will continue to follow hemoglobin/hematocrit trend closely to decide on need for intravenous iron/ESA. 4.  Chronic kidney disease/metabolic bone disease: He was hypercalcemic as an outpatient and calcitriol has been discontinued, he appears to have early calciphylaxis of the right leg and it is imperative to avoid calcium containing binders and VDRA along with vigilant monitoring for wounds. 5.  Hypertension: Monitor with ongoing pain management and hemodialysis/ultrafiltration.  Subjective:   Complains of intermittent abdominal pain and an area of skin change/soreness of his right leg/pretibial area.   Objective:   BP 137/74 (BP Location: Left Arm)   Pulse 73   Temp 98 F (36.7 C) (Oral)   Resp 18   Ht 6\' 2"  (1.88 m)   Wt 126 kg   SpO2 100%   BMI 35.66 kg/m   Physical Exam: Gen: Comfortably resting in bed, speaking on cell phone CVS: Pulse regular rhythm, normal rate, S1 and S2 with ejection systolic murmur Resp: Diminished breath sounds over bases from poor inspiratory effort, no rales/rhonchi.  Right IJ TDC Abd: Guarding without rigidity, tenderness noted over upper quadrants Ext: 1+ lower extremity edema with focal area of nodularity/hyperpigmentation over  right leg that appears to be consistent with early calciphylaxis  Labs: BMET Recent Labs  Lab 08/21/20 2241 08/22/20 0506 08/22/20 0621 08/23/20 0054  NA 128* 129* 131* 131*  K 3.5 3.8 3.8 3.7  CL 84* 94*  --  93*  CO2 19* 20*  --  22  GLUCOSE 130* 109*  --  83  BUN 153* 154*  --  156*  CREATININE 15.56* 15.48*  --  15.53*  CALCIUM 12.6* 11.7*  --  10.6*  PHOS  --  7.1*  --   --    CBC Recent Labs  Lab 08/21/20 2241 08/22/20 0506 08/22/20 0621 08/23/20 0054  WBC 8.9 9.1  --  8.8  NEUTROABS 8.0*  --   --   --   HGB 10.8* 9.9* 10.5* 9.3*  HCT 32.2* 30.8* 31.0* 28.4*  MCV 81.9 84.2  --  81.6  PLT 256 191  --  165     Medications:    . allopurinol  100 mg Oral Daily  . Chlorhexidine Gluconate Cloth  6 each Topical Q0600  . heparin  5,000 Units Subcutaneous Q8H  . levothyroxine  137 mcg Oral Q0600  . polyethylene glycol  17 g Oral Daily  . senna-docusate  2 tablet Oral QHS   Elmarie Shiley, MD 08/23/2020, 10:10 AM

## 2020-08-23 NOTE — Consult Note (Addendum)
[facility] Consult: 9:46 AM 08/23/2020  LOS: 1 day    Referring Provider: Dr. Algis Liming Primary Care Physician:  Biagio Borg, MD Primary Gastroenterologist:  Dr. [name].   Reason for Consultation: Pancreatitis.   HPI: Craig Flynn is a 60 y.o. male.  PMH advanced CKD 5, initiated HD during this admission.  Remote cholecystectomy.  Thyroid cancer, thyroidectomy, hypothyroidism GERD.  Diastolic CHF.  Iron deficiency and B12 anemia.  6 mm tubular adenoma, no HGD on colonoscopy 10/2015.  Variable Z-line, small hiatal hernia on EGD 04/2015.  Biopsies of Z-line showed chronic inflammation, no dysplasia etc. 11/2018 EGD.  For IDA.  Small hiatal hernia, no Cameron's lesions.  Patchy mild, nonbleeding erythema in gastric cardia, biopsies.  Otherwise normal stomach and duodenum to second portion, biopsies obtained for celiac disease. 11/2018 colonoscopy.  For adenomatous polyp surveillance and IDA.  Normal study to the terminal ileum.  Admitted yesterday with severe periumbilical pain, nonbloody nausea vomiting and constipation.  Pain began the day before but it was mild.  Later he developed the nausea and vomiting and the pain accelerated.  It is located in the region of the umbilicus and epigastrium and radiates into his mid back.  He thought it might be kidney stones.  He has been urinating well.  Took Dulcolax and had a small brown stool at home but none since.  Still feeling constipated.  For the past few to several weeks he has had episodes of lightheaded/dizziness when he is out and about.  Has not had syncope.  Has not taken blood pressure during the spells.  Normally his blood pressure at rest is 130s over 80s.  However at arrival to the ED blood pressure was 96/48 but has since normalized.  No tachycardia, no hypoxia.   Recorded urine output of 225 mL yesterday, 200 mL so far today.  Lipase 1206 >> 1378 >> 1294 today., unremarkable LFTs.  Triglycerides 162.  No alcohol.  No new medications or adjustment of medication dose.  No insect bites.  No previous history of pancreatitis.  CTAP with uncomplicated pancreatitis at the head and body, no fluid collection/pseudocyst/necrosis.  No biliary ductal abnormalities.  Pain is controlled for a few hours after Dilaudid and oxycodone.  Tolerating full liquids no further emesis.  No bowel movement since the small brown stool at home day before yesterday despite a dose of Senokot last night.  Received sodium bicarb infusions, normal saline running at 75 an hour but did receive a fluid bolus of 1 L early yesterday. Plan is to initiate on hemodialysis today.  No family history of gallbladder, pancreatic, intestinal or stomach problems.  His father-in-law died with pancreatic cancer.  Patient does not drink alcohol or use illicit drugs.     Past Medical History:  Diagnosis Date  . Anemia 06/2015  . Arthritis    knee  . Cancer [facility])    thyroid cancer  . Cardiomyopathy ([place])    a. 10/2013: EF reduced to 35-40% b. 09/2014: EF improved to 55-60%, Grade 1 DD noted.  . Chest  pain 06/2015  . CHF (congestive heart failure) ([place])   . CKD (chronic kidney disease), stage IV [facility])    sees  Dr Lorrene Reid  . Dyspnea    with exerion   . GERD (gastroesophageal reflux disease)   . Gout   . Hypertension   . Obesity   . Sleep apnea    not wearing c-pap now, needs new slep study per pt. (01/22/2017)  . Tuberculosis 1981   while the pt was in the service.  . Tubular adenoma of colon 10/2015  . Wears glasses     Past Surgical History:  Procedure Laterality Date  . AV FISTULA PLACEMENT Left 03/25/2016   Procedure: Creation of Left Arm ARTERIOVENOUS (AV) FISTULA;  Surgeon: Elam Dutch, MD;  Location: [facility] OR;  Service: Vascular;  Laterality: Left;  . COLONOSCOPY W/ BIOPSIES AND  POLYPECTOMY     "no problem"  . HERNIA REPAIR    . IR FLUORO GUIDE CV LINE RIGHT  08/22/2020  . IR US GUIDE VASC ACCESS RIGHT  08/22/2020  . LAPAROSCOPIC CHOLECYSTECTOMY    . LIGATION OF COMPETING BRANCHES OF ARTERIOVENOUS FISTULA Left 01/25/2018   Procedure: LIGATION OF COMPETING BRANCHES And Revision of ARTERIOVENOUS FISTULA LEFT ARM.;  Surgeon: Elam Dutch, MD;  Location: [place];  Service: Vascular;  Laterality: Left;  . THYROIDECTOMY  01/22/2017  . THYROIDECTOMY Left 01/22/2017   Procedure: THYROIDECTOMY;  Surgeon: Melida Quitter, MD;  Location: [place];  Service: ENT;  Laterality: Left;  . THYROIDECTOMY Right 03/26/2017   Procedure: COMPLETION OF THYROIDECTOMY;  Surgeon: Melida Quitter, MD;  Location: [place];  Service: ENT;  Laterality: Right;  . UMBILICAL HERNIA REPAIR    . WISDOM TOOTH EXTRACTION      Prior to Admission medications   Medication Sig Start Date End Date Taking? Authorizing Provider  acetaminophen (TYLENOL) 500 MG tablet Take 2 tablets (1,000 mg total) by mouth every 8 (eight) hours as needed for mild pain or headache. 05/05/18  Yes Shambley, Delphia Grates, NP  albuterol (VENTOLIN HFA) 108 (90 Base) MCG/ACT inhaler INHALE 2 PUFFS INTO THE LUNGS EVERY 6 HOURS AS NEEDED FOR WHEEZING OR SHORTNESS OF BREATH Patient taking differently: Inhale 2 puffs into the lungs every 6 (six) hours as needed for wheezing or shortness of breath. 08/01/20  Yes Biagio Borg, MD  allopurinol (ZYLOPRIM) 100 MG tablet Take 150 mg by mouth daily. Taking 1 & 1/2 tab= 150 mg 10/27/18  Yes [provider]  aspirin EC 81 MG tablet Take 81 mg by mouth daily.   Yes [provider]  calcium elemental as carbonate (BARIATRIC TUMS ULTRA) 400 MG chewable tablet Chew 1,000 mg by mouth as needed for heartburn.    Yes [provider]  carvedilol (COREG) 25 MG tablet TAKE 1 TABLET(25 MG) BY MOUTH TWICE DAILY Patient taking differently: Take 25 mg by mouth 2 (two) times daily with a meal. 03/20/20   Yes Biagio Borg, MD  cyclobenzaprine (FLEXERIL) 10 MG tablet Take 1 tablet (10 mg total) by mouth 2 (two) times daily as needed for muscle spasms. 07/31/20  Yes Ripley Fraise, MD  fenofibrate 54 MG tablet TAKE 1 TABLET BY MOUTH EVERY DAY Patient taking differently: Take 54 mg by mouth daily. 08/07/20  Yes Minus Breeding, MD  furosemide (LASIX) 80 MG tablet Take 1.5 tablets (120 mg total) by mouth See admin instructions. Take 120 mg by mouth in the morning and 120 mg in the afternoon 03/26/20  Yes Isla Pence,  MD  hydrALAZINE (APRESOLINE) 25 MG tablet Take 25 mg by mouth in the morning and at bedtime. 07/31/19  Yes [provider]  iron polysaccharides (NIFEREX) 150 MG capsule Take 150 mg by mouth daily. 12/18/16  Yes [provider]  isosorbide mononitrate (IMDUR) 30 MG 24 hr tablet TAKE 2 TABLETS(60 MG) BY MOUTH DAILY Patient taking differently: Take 60 mg by mouth daily. TAKE 2 TABLETS(60 MG) BY MOUTH DAILY 01/15/20  Yes Minus Breeding, MD  levothyroxine (SYNTHROID) 137 MCG tablet Take 137 mcg by mouth every morning. 07/09/20  Yes [provider]  nitroGLYCERIN (NITROSTAT) 0.4 MG SL tablet DISSOLVE 1 TABLET UNDER THE TONGUE EVERY 5 MINUTES AS NEEDED FOR CHEST PAIN Patient taking differently: Place 0.4 mg under the tongue every 5 (five) minutes as needed for chest pain. 03/29/19  Yes Tami Lin, MD  pantoprazole (PROTONIX) 40 MG tablet Take 1 tablet (40 mg total) by mouth 2 (two) times daily before a meal. 11/29/19  Yes Biagio Borg, MD  Potassium Chloride ER 20 MEQ TBCR Take 20 mEq by mouth daily. 08/16/19  Yes [provider]  thiamine (VITAMIN B-1) 50 MG tablet Take 1 tablet (50 mg total) by mouth daily. 03/25/20  Yes Janith Lima, MD  calcitRIOL (ROCALTROL) 0.5 MCG capsule Take 0.5 mcg by mouth every Monday, Wednesday, and Friday. 12/10/18   [provider]  levothyroxine (SYNTHROID) 100 MCG tablet Take 1 tablet (100 mcg total) by mouth  daily. Patient not taking: No sig reported 03/24/20   Janith Lima, MD  sodium bicarbonate 650 MG tablet Take 1 tablet (650 mg total) by mouth 3 (three) times daily. Patient not taking: Reported on 08/22/2020 05/26/19   Biagio Borg, MD    Scheduled Meds: . allopurinol  100 mg Oral Daily  . Chlorhexidine Gluconate Cloth  6 each Topical Q0600  . heparin  5,000 Units Subcutaneous Q8H  . levothyroxine  137 mcg Oral Q0600  . polyethylene glycol  17 g Oral Daily  . senna-docusate  2 tablet Oral QHS   Infusions: . sodium chloride     PRN Meds: acetaminophen, bisacodyl, HYDROmorphone (DILAUDID) injection, melatonin, ondansetron (ZOFRAN) IV, oxyCODONE   Allergies as of 08/21/2020  . (No Known Allergies)    Family History  Problem Relation Age of Onset  . Stroke Mother   . Pneumonia Father        died of Pneumonia x3  . Kidney disease Maternal Grandmother   . Hypertension Maternal Grandmother   . Healthy Maternal Grandfather   . Healthy Paternal Grandmother   . Healthy Paternal Grandfather   . CAD Neg Hx   . Colon cancer Neg Hx   . Esophageal cancer Neg Hx   . Pancreatic cancer Neg Hx   . Prostate cancer Neg Hx   . Stomach cancer Neg Hx   . Rectal cancer Neg Hx     Social History   Socioeconomic History  . Marital status: Divorced    Spouse name: Not on file  . Number of children: 2  . Years of education: 69  . Highest education level: Not on file  Occupational History  . Occupation: Disability    Comment: Knees   Tobacco Use  . Smoking status: Current Every Day Smoker    Packs/day: 0.50    Years: 40.00    Pack years: 20.00    Types: Cigarettes  . Smokeless tobacco: Never Used  Vaping Use  . Vaping Use: Never used  Substance  and Sexual Activity  . Alcohol use: Not Currently    Alcohol/week: 0.0 standard drinks  . Drug use: No  . Sexual activity: Yes  Other Topics Concern  . Not on file  Social History Narrative   Fun: Golf, basketball    Denies religious  beliefs effecting health care.    Social Determinants of Health   Financial Resource Strain: Low Risk   . Difficulty of Paying Living Expenses: Not hard at all  Food Insecurity: No Food Insecurity  . Worried About Charity fundraiser in the Last Year: Never true  . Ran Out of Food in the Last Year: Never true  Transportation Needs: No Transportation Needs  . Lack of Transportation (Medical): No  . Lack of Transportation (Non-Medical): No  Physical Activity: Sufficiently Active  . Days of Exercise per Week: 5 days  . Minutes of Exercise per Session: 30 min  Stress: No Stress Concern Present  . Feeling of Stress : Not at all  Social Connections: Moderately Integrated  . Frequency of Communication with Friends and Family: More than three times a week  . Frequency of Social Gatherings with Friends and Family: More than three times a week  . Attends Religious Services: More than 4 times per year  . Active Member of Clubs or Organizations: Yes  . Attends Archivist Meetings: More than 4 times per year  . Marital Status: Never married  Human resources officer Violence: Not on file    REVIEW OF SYSTEMS: Constitutional: No profound fatigue or weakness. ENT:  No nose bleeds Pulm: No shortness of breath or cough. CV:  No palpitations, no LE edema.  No angina. GU: Still makes good amount of urine which is yellow and not dark or bloody.  No hematuria, no frequency GI: See HPI. Heme: Denies excessive or unusual bleeding or bruising. Transfusions: None. Neuro:  No headaches, no peripheral tingling or numbness.  No syncope, no seizures.  Dizziness/lightheadedness as per HPI. Derm:  No itching, no rash or sores.  Endocrine:  No sweats or chills.  No polyuria or dysuria Immunization: Not queried.  Reviewed and he is received Pfizer COVID-19 vaccine x3. Travel:  None beyond local counties in last few months.    PHYSICAL EXAM: Vital signs in last 24 hours: Vitals:   08/23/20 0252  08/23/20 0540  BP: 136/75 137/74  Pulse: 69 73  Resp: 19 18  Temp: 98.2 F (36.8 C) 98 F (36.7 C)  SpO2: 100% 100%   Wt Readings from Last 3 Encounters:  08/21/20 126 kg  06/07/20 125.7 kg  03/26/20 126.1 kg    General: Pleasant, well-appearing, comfortable, obese.  Appears stated age.  No distress. Head: No facial asymmetry or swelling.  No signs of head trauma. Eyes: No conjunctival pallor.  No scleral icterus. Ears: No hearing deficit Nose: No congestion or discharge. Mouth: Tongue midline.  Mucosa pink, moist, clear.  Good dentition. Neck: No JVD, no masses, no thyromegaly Lungs: Clear bilaterally without labored breathing or cough Heart: RRR.  No MRG.  S1, S2 present. Abdomen: Obese, soft, normal active bowel sounds.  Tenderness in the epigastrium down into the periumbilical area without guarding or rebound.  Not tender in the right or left upper quadrants or elsewhere.  No HSM, masses, bruits.  Ventral hernia at the midline superior to his umbilicus..   Rectal: Deferred Musc/Skeltl: No joint redness, swelling or gross deformity. Extremities: No CCE. Neurologic: Alert.  Oriented x3.  Moves all 4 limbs with full  strength.  No tremors.  No asterixis. Skin: No suspicious sores, rashes or lesions. Nodes: No cervical adenopathy. Psych: Cooperative, calm, pleasant.  Fluid speech.  Intake/Output from previous day: 06/02 0701 - 06/03 0700 In: 2130.7 [P.O.:300; I.V.:1730.7; IV Piggyback:100] Out: 225 [Urine:225] Intake/Output this shift: Total I/O In: -  Out: 200 [Urine:200]  LAB RESULTS: Recent Labs    08/21/20 2241 08/22/20 0506 08/22/20 0621 08/23/20 0054  WBC 8.9 9.1  --  8.8  HGB 10.8* 9.9* 10.5* 9.3*  HCT 32.2* 30.8* 31.0* 28.4*  PLT 256 191  --  165   BMET Lab Results  Component Value Date   NA 131 (L) 08/23/2020   NA 131 (L) 08/22/2020   NA 129 (L) 08/22/2020   K 3.7 08/23/2020   K 3.8 08/22/2020   K 3.8 08/22/2020   CL 93 (L) 08/23/2020   CL 94  (L) 08/22/2020   CL 84 (L) 08/21/2020   CO2 22 08/23/2020   CO2 20 (L) 08/22/2020   CO2 19 (L) 08/21/2020   GLUCOSE 83 08/23/2020   GLUCOSE 109 (H) 08/22/2020   GLUCOSE 130 (H) 08/21/2020   BUN 156 (H) 08/23/2020   BUN 154 (H) 08/22/2020   BUN 153 (H) 08/21/2020   CREATININE 15.53 (H) 08/23/2020   CREATININE 15.48 (H) 08/22/2020   CREATININE 15.56 (H) 08/21/2020   CALCIUM 10.6 (H) 08/23/2020   CALCIUM 11.7 (H) 08/22/2020   CALCIUM 12.6 (H) 08/21/2020   LFT Recent Labs    08/21/20 2241 08/22/20 0506 08/23/20 0054  PROT 7.2 6.5 6.1*  ALBUMIN 4.4 3.9 3.5  AST 19 18 16   ALT 19 15 15   ALKPHOS 33* 28* 28*  BILITOT 0.8 0.6 0.6   PT/INR No results found for: INR, PROTIME Hepatitis Panel No results for input(s): HEPBSAG, HCVAB, HEPAIGM, HEPBIGM in the last 72 hours. C-Diff No components found for: CDIFF Lipase     Component Value Date/Time   LIPASE 1,294 (H) 08/23/2020 0054    Drugs of Abuse     Component Value Date/Time   LABOPIA NONE DETECTED 06/22/2019 1958   COCAINSCRNUR NONE DETECTED 06/22/2019 1958   LABBENZ NONE DETECTED 06/22/2019 1958   AMPHETMU NONE DETECTED 06/22/2019 1958   THCU NONE DETECTED 06/22/2019 1958   LABBARB NONE DETECTED 06/22/2019 1958     RADIOLOGY STUDIES: CT ABDOMEN PELVIS WO CONTRAST  Result Date: 08/21/2020 CLINICAL DATA:  Abdominal pain, nausea and vomiting for 2 days, tenderness to palpation EXAM: CT ABDOMEN AND PELVIS WITHOUT CONTRAST TECHNIQUE: Multidetector CT imaging of the abdomen and pelvis was performed following the standard protocol without IV contrast. COMPARISON:  03/26/2020 FINDINGS: Lower chest: No acute pleural or parenchymal lung disease. Hepatobiliary: Gallbladder is surgically absent. Unenhanced imaging of the liver demonstrates no focal abnormality. No biliary duct dilation. Pancreas: Significant inflammatory changes surround the head and body of the pancreas, consistent with acute pancreatitis. No evidence of fluid  collection, pseudocyst, or abscess. Spleen: Normal in size without focal abnormality. Adrenals/Urinary Tract: The adrenals are stable. Bilateral renal cortical atrophy unchanged. No urinary tract calculi or obstructive uropathy. Bladder is decompressed which limits its evaluation. Stomach/Bowel: No bowel obstruction or ileus. Normal appendix right lower quadrant. No bowel wall thickening or inflammatory change. Vascular/Lymphatic: Aortic atherosclerosis. No enlarged abdominal or pelvic lymph nodes. Reproductive: Prostate is unremarkable. Other: Trace mesenteric free fluid within the upper abdomen related to pancreatitis. No free gas. Stable small umbilical hernia. Musculoskeletal: No acute or destructive bony lesions. Reconstructed images demonstrate no additional findings. IMPRESSION: 1.  Acute uncomplicated pancreatitis. No fluid collection, pseudocyst, or abscess. 2. Stable small umbilical hernia. 3.  Aortic Atherosclerosis (ICD10-I70.0). Electronically Signed   By: Randa Ngo M.D.   On: 08/21/2020 23:22   IR Fluoro Guide CV Line Right  Result Date: 08/22/2020 CLINICAL DATA:  Chronic kidney disease, stage V, and need for tunneled hemodialysis catheter to begin immediate dialysis. EXAM: TUNNELED CENTRAL VENOUS HEMODIALYSIS CATHETER PLACEMENT WITH ULTRASOUND AND FLUOROSCOPIC GUIDANCE ANESTHESIA/SEDATION: 1.0 mg IV Versed; 25 mcg IV Fentanyl. Total Moderate Sedation Time:   21 minutes. The patient's level of consciousness and physiologic status were continuously monitored during the procedure by Radiology nursing. MEDICATIONS: The patient received 2 g of IV Ancef prior to the procedure. FLUOROSCOPY TIME:  6 seconds.  1.0 mGy. PROCEDURE: The procedure, risks, benefits, and alternatives were explained to the patient. Questions regarding the procedure were encouraged and answered. The patient understands and consents to the procedure. A timeout was performed prior to initiating the procedure. The right neck and  chest were prepped with chlorhexidine in a sterile fashion, and a sterile drape was applied covering the operative field. Maximum barrier sterile technique with sterile gowns and gloves were used for the procedure. Local anesthesia was provided with 1% lidocaine. Ultrasound was used to confirm patency of the right internal jugular vein. After creating a small venotomy incision, a 21 gauge needle was advanced into the right internal jugular vein under direct, real-time ultrasound guidance. Ultrasound image documentation was performed. After securing guidewire access, an 8 Fr dilator was placed. A J-wire was kinked to measure appropriate catheter length. A Palindrome tunneled hemodialysis catheter measuring 23 cm from tip to cuff was chosen for placement. This was tunneled in a retrograde fashion from the chest wall to the venotomy incision. At the venotomy, serial dilatation was performed and a 15 Fr peel-away sheath was placed over a guidewire. The catheter was then placed through the sheath and the sheath removed. Final catheter positioning was confirmed and documented with a fluoroscopic spot image. The catheter was aspirated, flushed with saline, and injected with appropriate volume heparin dwells. The venotomy incision was closed with subcuticular 4-0 Vicryl. Dermabond was applied to the incision. The catheter exit site was secured with 0-Prolene retention sutures. COMPLICATIONS: None.  No pneumothorax. FINDINGS: After catheter placement, the tip lies in the right atrium. The catheter aspirates normally and is ready for immediate use. IMPRESSION: Placement of tunneled hemodialysis catheter via the right internal jugular vein. The catheter tip lies in the right atrium. The catheter is ready for immediate use. Electronically Signed   By: Aletta Edouard M.D.   On: 08/22/2020 17:10       IMPRESSION:   *    acute pancreatitis of undetermined cause.  Remote cholecystectomy with normal biliary tree on current  CT.  ? Ischemic as he has had periodic spells of lightheadedness dizziness suggesting hypotension blood pressure was low at arrival to the ED.  No new meds no dose adjustments.  No ETOH.  Trigs mildly elevated at 162 but this is not enough to cause pancreatitis.  *    CKD stage 5.  Was anticipating HD soon PTA and this is now initiated today.  *     Hyponatremia.  *    chronic normocytic anemia.  Hb 9.3, previously anywhere from 9.7-10.6 earlier this year.    PLAN:     *    IgG 4, to r/o autoimmune pancreatitis.  ? MRI/MRCP?   Azucena Freed  08/23/2020, 9:46 AM  Phone [phone]  GI ATTENDING  History, laboratories, x-rays reviewed.  Patient personally seen and examined.  Agree with comprehensive consultation note as outlined above.  60 year old gentleman with multiple significant medical problems including end-stage renal disease for which she is starting hemodialysis today.  Presents to the hospital with acute pancreatitis.  Etiology uncertain at this point.  However, not alcohol or biliary.  Imaging of his pancreas was noncontrast.  He is nontoxic.  Tolerating liquids.  Tender on physical exam.  Would expect lipase levels to come down quite slowly in the face of advanced renal disease.  In any event, recommend supportive care.  Advance diet as tolerated.  Avoid fatty foods.  Pain control as needed.  After discharge patient will need GI follow-up as an outpatient.  He will need advanced imaging of his pancreas in about 6 to 8 weeks after all inflammation has resolved.  This to rule out underlying lesion which may explain pancreatitis.  I have discussed this with the patient.  We will follow.  Docia Chuck. Geri Seminole., M.D. [facility] Division of Gastroenterology

## 2020-08-24 ENCOUNTER — Encounter (HOSPITAL_COMMUNITY): Payer: Medicare HMO

## 2020-08-24 ENCOUNTER — Inpatient Hospital Stay (HOSPITAL_COMMUNITY): Payer: Medicare HMO

## 2020-08-24 DIAGNOSIS — N186 End stage renal disease: Secondary | ICD-10-CM

## 2020-08-24 DIAGNOSIS — Z01810 Encounter for preprocedural cardiovascular examination: Secondary | ICD-10-CM

## 2020-08-24 LAB — COMPREHENSIVE METABOLIC PANEL
ALT: 11 U/L (ref 0–44)
AST: 20 U/L (ref 15–41)
Albumin: 3.3 g/dL — ABNORMAL LOW (ref 3.5–5.0)
Alkaline Phosphatase: 33 U/L — ABNORMAL LOW (ref 38–126)
Anion gap: 13 (ref 5–15)
BUN: 107 mg/dL — ABNORMAL HIGH (ref 6–20)
CO2: 25 mmol/L (ref 22–32)
Calcium: 9.8 mg/dL (ref 8.9–10.3)
Chloride: 95 mmol/L — ABNORMAL LOW (ref 98–111)
Creatinine, Ser: 11.09 mg/dL — ABNORMAL HIGH (ref 0.61–1.24)
GFR, Estimated: 5 mL/min — ABNORMAL LOW (ref >60)
Glucose, Bld: 67 mg/dL — ABNORMAL LOW (ref 70–99)
Potassium: 3.2 mmol/L — ABNORMAL LOW (ref 3.5–5.1)
Sodium: 133 mmol/L — ABNORMAL LOW (ref 135–145)
Total Bilirubin: 0.6 mg/dL (ref 0.3–1.2)
Total Protein: 6.2 g/dL — ABNORMAL LOW (ref 6.5–8.1)

## 2020-08-24 LAB — CBC
HCT: 28.7 % — ABNORMAL LOW (ref 39.0–52.0)
Hemoglobin: 9.4 g/dL — ABNORMAL LOW (ref 13.0–17.0)
MCH: 26.9 pg (ref 26.0–34.0)
MCHC: 32.8 g/dL (ref 30.0–36.0)
MCV: 82 fL (ref 80.0–100.0)
Platelets: 167 10*3/uL (ref 150–400)
RBC: 3.5 MIL/uL — ABNORMAL LOW (ref 4.22–5.81)
RDW: 12.5 % (ref 11.5–15.5)
WBC: 7.4 10*3/uL (ref 4.0–10.5)
nRBC: 0 % (ref 0.0–0.2)

## 2020-08-24 LAB — GLUCOSE, CAPILLARY
Glucose-Capillary: 68 mg/dL — ABNORMAL LOW (ref 70–99)
Glucose-Capillary: 68 mg/dL — ABNORMAL LOW (ref 70–99)
Glucose-Capillary: 70 mg/dL (ref 70–99)
Glucose-Capillary: 90 mg/dL (ref 70–99)

## 2020-08-24 LAB — LIPASE, BLOOD: Lipase: 332 U/L — ABNORMAL HIGH (ref 11–51)

## 2020-08-24 MED ORDER — BISACODYL 10 MG RE SUPP
10.0000 mg | Freq: Once | RECTAL | Status: AC
  Administered 2020-08-24: 10 mg via RECTAL
  Filled 2020-08-24: qty 1

## 2020-08-24 MED ORDER — POLYETHYLENE GLYCOL 3350 17 G PO PACK
17.0000 g | PACK | Freq: Two times a day (BID) | ORAL | Status: DC
  Administered 2020-08-24 – 2020-08-28 (×5): 17 g via ORAL
  Filled 2020-08-24 (×5): qty 1

## 2020-08-24 NOTE — Plan of Care (Signed)
  Problem: Education: Goal: Knowledge of General Education information will improve Description Including pain rating scale, medication(s)/side effects and non-pharmacologic comfort measures Outcome: Progressing   

## 2020-08-24 NOTE — Progress Notes (Signed)
PROGRESS NOTE   Craig Flynn  WJX:[id number]    DOB: 04-Nov-1960    DOA: 08/21/2020  PCP: Biagio Borg, MD   I have briefly reviewed patients previous medical records in [facility].  Chief Complaint  Patient presents with  . Abdominal Pain  . Emesis  . Nausea  . Hernia    Brief Narrative:  60 year old male with medical history significant for and not limited to prior cholecystectomy ("10-15 years ago"), CKD 5 (with plan for HD access and starting HD soon), thyroid cancer/post thyroidectomy hypothyroidism, essential hypertension, GERD, gout, anemia, chronic diastolic CHF who presented to ED with 1 day history of severe periumbilical pain with associated nausea, vomiting and constipation.  Admitted for acute pancreatitis without complicating features and progression to ESRD with early uremic symptoms.  Nephrology consulted, [facility] placed by IR, plans to start HD 6/3.  VVS consulted and plan permanent vascular access next week.  Due to persistent pain and markedly elevated lipase, [facility] consult.  Improving.   Assessment & Plan:  Active Problems:   Acute pancreatitis   Acute pancreatitis: Lipase 1378 on admission.  LFTs unremarkable.  CT abdomen and pelvis showed acute uncomplicated pancreatitis without fluid collection, pseudocyst or abscess.  S/p remote cholecystectomy.  TG: 162.  No history of alcohol use, recent medication changes or new OTC medication use for insect bites.  Denies prior episodes of pancreatitis.  Etiology unclear.?  Idiopathic.  Treated supportively with bowel rest, slowly advance diet to nonfatty diet, brief IV fluids for about 48 hours and pain management.  Due to persistent pain and persistent elevated lipase, [facility] was consulted.  Persistently elevated lipase likely due to ESRD and poor renal clearance.  Lipase improved after HD.  Symptomatically better but with still intermittent abdominal pain.  GI signed off and recommend outpatient follow-up for  advanced pancreatic imaging in 6 to 8 weeks and will arrange follow-up with Dr. [name].  New ESRD with early uremia/high anion gap metabolic acidosis: Presented with creatinine >15.  Follows with Dr. [name], nephrology with plans for HD port placement sometime this week and initiation of HD next week as OP.  Nephrology consulted, [facility] placed by IR, started HD 6/3 and again getting it today.  VVS consulted and plan permanent vascular access next week.  Hypercalcemia: Unclear etiology.  Presented with serum calcium of 12.6 which has come down to 11.7 post hydration.  Could be related to dehydration versus other etiologies.  Almost resolved with IV fluids.  Avoid Tums which patient reported taking lots of for acid reflux.  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  Trial of OTC laxatives and MOM at home without much effect.  Aggressive bowel regimen initiated.  Had 2 small BMs yesterday.  Essential hypertension: Had soft blood pressures earlier on in admission, now blood pressures trending up.  Monitor closely and consider resuming antihypertensives.  Anemia in chronic kidney disease: Hemoglobin stable in the 9-10 range.  Post thyroidectomy hypothyroidism: Continue levothyroxine.  GERD: PPI  Gout: No acute flare.  Continue allopurinol.  Hypoglycemia: CBG 67 this morning.  Not diabetic.  On no hypoglycemic meds.  Possibly due to ESRD.  Monitor CBGs closely and treat per hypoglycemia protocol.  Body mass index is 32.55 kg/m.    DVT  prophylaxis: heparin injection 5,000 Units Start: 08/22/20 2200 SCDs Start: 08/22/20 0444     Code Status: Full Code Family Communication: None at bedside. Disposition:  Status is: Inpatient  Remains inpatient appropriate  because:Inpatient level of care appropriate due to severity of illness   Dispo:  Patient From: Home  Planned Disposition: Home  Medically stable for discharge: No          Consultants:   Nephrology Interventional radiology  Procedures:   None  Antimicrobials:    Anti-infectives (From admission, onward)   Start     Dose/Rate Route Frequency Ordered Stop   08/22/20 1300  ceFAZolin (ANCEF) IVPB 2g/100 mL premix        2 g 200 mL/hr over 30 Minutes Intravenous  Once 08/22/20 1251 08/22/20 1458        Subjective:  Tolerating diet without nausea or vomiting.  Intermittent abdominal pain at times significant.  2 small BMs after Dulcolax suppository.  Trial of suppository again today.  Objective:   Vitals:   08/24/20 1445 08/24/20 1502 08/24/20 1517 08/24/20 1532  BP: (!) 150/66 137/71 (!) 141/69 (!) 154/72  Pulse: 71     Resp: (!) 21 15 16 18   Temp:      TempSrc:      SpO2:      Weight:      Height:        General exam: Young male, moderately built and nourished, sitting up comfortably in bed. Respiratory system: Clear to auscultation.  No increased work of breathing. Cardiovascular system: S1 and S2 heard, RRR.  No JVD, murmurs or pedal edema.  Telemetry personally reviewed: Sinus rhythm with some PVCs Gastrointestinal system: Abdomen is nondistended, soft and nontender.  Normal bowel sounds heard Central nervous system: Alert and oriented. No focal neurological deficits. Extremities: Symmetric 5 x 5 power. Skin: Small area of subcutaneous nodularity on either side of right lower ankle, likely calciphylaxis. Psychiatry: Judgement and insight appear normal. Mood & affect appropriate.     Data Reviewed:   I have personally reviewed following labs and imaging studies   CBC: Recent Labs  Lab 08/21/20 2241 08/22/20 0506 08/23/20 0054 08/23/20 1906 08/24/20 0054  WBC 8.9   < > 8.8 8.8 7.4  NEUTROABS 8.0*  --   --   --   --   HGB 10.8*   < > 9.3* 10.3* 9.4*   HCT 32.2*   < > 28.4* 30.6* 28.7*  MCV 81.9   < > 81.6 81.2 82.0  PLT 256   < > 165 175 167   < > = values in this interval not displayed.    Basic Metabolic Panel: Recent Labs  Lab 08/22/20 0506 08/22/20 0621 08/23/20 0054 08/24/20 0054  NA 129* 131* 131* 133*  K 3.8 3.8 3.7 3.2*  CL 94*  --  93* 95*  CO2 20*  --  22 25  GLUCOSE 109*  --  83 67*  BUN 154*  --  156* 107*  CREATININE 15.48*  --  15.53* 11.09*  CALCIUM 11.7*  --  10.6* 9.8  MG 2.0  --   --   --   PHOS 7.1*  --   --   --     Liver Function Tests: Recent Labs  Lab 08/22/20 0506 08/23/20 0054 08/24/20 0054  AST 18 16 20   ALT 15 15 11   ALKPHOS 28* 28* 33*  BILITOT 0.6 0.6 0.6  PROT 6.5 6.1* 6.2*  ALBUMIN 3.9 3.5 3.3*  CBG: Recent Labs  Lab 08/24/20 0651 08/24/20 0655 08/24/20 0837  GLUCAP 68* 70 90    Microbiology Studies:   Recent Results (from the past 240 hour(s))  Resp Panel by RT-PCR (Flu A&B, Covid) Nasopharyngeal Swab     Status: None   Collection Time: 08/22/20  2:55 AM   Specimen: Nasopharyngeal Swab; Nasopharyngeal(NP) swabs in vial transport medium  Result Value Ref Range Status   SARS Coronavirus 2 by RT PCR NEGATIVE NEGATIVE Final    Comment: (NOTE) SARS-CoV-2 target nucleic acids are NOT DETECTED.  The SARS-CoV-2 RNA is generally detectable in upper respiratory specimens during the acute phase of infection. The lowest concentration of SARS-CoV-2 viral copies this assay can detect is 138 copies/mL. A negative result does not preclude SARS-Cov-2 infection and should not be used as the sole basis for treatment or other patient management decisions. A negative result may occur with  improper specimen collection/handling, submission of specimen other than nasopharyngeal swab, presence of viral mutation(s) within the areas targeted by this assay, and inadequate number of viral copies(<138 copies/mL). A negative result must be combined with clinical observations, patient  history, and epidemiological information. The expected result is Negative.  Fact Sheet for Patients:  EntrepreneurPulse.com.au  Fact Sheet for Healthcare Providers:  IncredibleEmployment.be  This test is no t yet approved or cleared by the [place] FDA and  has been authorized for detection and/or diagnosis of SARS-CoV-2 by FDA under an Emergency Use Authorization (EUA). This EUA will remain  in effect (meaning this test can be used) for the duration of the COVID-19 declaration under Section 564(b)(1) of the Act, 21 U.S.C.section 360bbb-3(b)(1), unless the authorization is terminated  or revoked sooner.       Influenza A by PCR NEGATIVE NEGATIVE Final   Influenza B by PCR NEGATIVE NEGATIVE Final    Comment: (NOTE) The Xpert Xpress SARS-CoV-2/FLU/RSV plus assay is intended as an aid in the diagnosis of influenza from Nasopharyngeal swab specimens and should not be used as a sole basis for treatment. Nasal washings and aspirates are unacceptable for Xpert Xpress SARS-CoV-2/FLU/RSV testing.  Fact Sheet for Patients: EntrepreneurPulse.com.au  Fact Sheet for Healthcare Providers: IncredibleEmployment.be  This test is not yet approved or cleared by the [place] FDA and has been authorized for detection and/or diagnosis of SARS-CoV-2 by FDA under an Emergency Use Authorization (EUA). This EUA will remain in effect (meaning this test can be used) for the duration of the COVID-19 declaration under Section 564(b)(1) of the Act, 21 U.S.C. section 360bbb-3(b)(1), unless the authorization is terminated or revoked.  Performed at [facility], [place] [address] [place], [place] [address]      Radiology Studies:  IR Fluoro Guide CV Line Right  Result Date: 08/22/2020 CLINICAL DATA:  Chronic kidney disease, stage V, and need for tunneled hemodialysis catheter to begin immediate dialysis. EXAM:  TUNNELED CENTRAL VENOUS HEMODIALYSIS CATHETER PLACEMENT WITH ULTRASOUND AND FLUOROSCOPIC GUIDANCE ANESTHESIA/SEDATION: 1.0 mg IV Versed; 25 mcg IV Fentanyl. Total Moderate Sedation Time:   21 minutes. The patient's level of consciousness and physiologic status were continuously monitored during the procedure by Radiology nursing. MEDICATIONS: The patient received 2 g of IV Ancef prior to the procedure. FLUOROSCOPY TIME:  6 seconds.  1.0 mGy. PROCEDURE: The procedure, risks, benefits, and alternatives were explained to the patient. Questions regarding the procedure were encouraged and answered. The patient understands and consents to the procedure. A timeout was performed prior to initiating the procedure. The right neck and  chest were prepped with chlorhexidine in a sterile fashion, and a sterile drape was applied covering the operative field. Maximum barrier sterile technique with sterile gowns and gloves were used for the procedure. Local anesthesia was provided with 1% lidocaine. Ultrasound was used to confirm patency of the right internal jugular vein. After creating a small venotomy incision, a 21 gauge needle was advanced into the right internal jugular vein under direct, real-time ultrasound guidance. Ultrasound image documentation was performed. After securing guidewire access, an 8 Fr dilator was placed. A J-wire was kinked to measure appropriate catheter length. A Palindrome tunneled hemodialysis catheter measuring 23 cm from tip to cuff was chosen for placement. This was tunneled in a retrograde fashion from the chest wall to the venotomy incision. At the venotomy, serial dilatation was performed and a 15 Fr peel-away sheath was placed over a guidewire. The catheter was then placed through the sheath and the sheath removed. Final catheter positioning was confirmed and documented with a fluoroscopic spot image. The catheter was aspirated, flushed with saline, and injected with appropriate volume heparin  dwells. The venotomy incision was closed with subcuticular 4-0 Vicryl. Dermabond was applied to the incision. The catheter exit site was secured with 0-Prolene retention sutures. COMPLICATIONS: None.  No pneumothorax. FINDINGS: After catheter placement, the tip lies in the right atrium. The catheter aspirates normally and is ready for immediate use. IMPRESSION: Placement of tunneled hemodialysis catheter via the right internal jugular vein. The catheter tip lies in the right atrium. The catheter is ready for immediate use. Electronically Signed   By: Aletta Edouard M.D.   On: 08/22/2020 17:10   IR US Guide Vasc Access Right  Result Date: 08/22/2020 CLINICAL DATA:  Chronic kidney disease, stage V, and need for tunneled hemodialysis catheter to begin immediate dialysis. EXAM: TUNNELED CENTRAL VENOUS HEMODIALYSIS CATHETER PLACEMENT WITH ULTRASOUND AND FLUOROSCOPIC GUIDANCE ANESTHESIA/SEDATION: 1.0 mg IV Versed; 25 mcg IV Fentanyl. Total Moderate Sedation Time:   21 minutes. The patient's level of consciousness and physiologic status were continuously monitored during the procedure by Radiology nursing. MEDICATIONS: The patient received 2 g of IV Ancef prior to the procedure. FLUOROSCOPY TIME:  6 seconds.  1.0 mGy. PROCEDURE: The procedure, risks, benefits, and alternatives were explained to the patient. Questions regarding the procedure were encouraged and answered. The patient understands and consents to the procedure. A timeout was performed prior to initiating the procedure. The right neck and chest were prepped with chlorhexidine in a sterile fashion, and a sterile drape was applied covering the operative field. Maximum barrier sterile technique with sterile gowns and gloves were used for the procedure. Local anesthesia was provided with 1% lidocaine. Ultrasound was used to confirm patency of the right internal jugular vein. After creating a small venotomy incision, a 21 gauge needle was advanced into the right  internal jugular vein under direct, real-time ultrasound guidance. Ultrasound image documentation was performed. After securing guidewire access, an 8 Fr dilator was placed. A J-wire was kinked to measure appropriate catheter length. A Palindrome tunneled hemodialysis catheter measuring 23 cm from tip to cuff was chosen for placement. This was tunneled in a retrograde fashion from the chest wall to the venotomy incision. At the venotomy, serial dilatation was performed and a 15 Fr peel-away sheath was placed over a guidewire. The catheter was then placed through the sheath and the sheath removed. Final catheter positioning was confirmed and documented with a fluoroscopic spot image. The catheter was aspirated, flushed with saline, and injected with  appropriate volume heparin dwells. The venotomy incision was closed with subcuticular 4-0 Vicryl. Dermabond was applied to the incision. The catheter exit site was secured with 0-Prolene retention sutures. COMPLICATIONS: None.  No pneumothorax. FINDINGS: After catheter placement, the tip lies in the right atrium. The catheter aspirates normally and is ready for immediate use. IMPRESSION: Placement of tunneled hemodialysis catheter via the right internal jugular vein. The catheter tip lies in the right atrium. The catheter is ready for immediate use. Electronically Signed   By: Aletta Edouard M.D.   On: 08/22/2020 17:10   VAS [place] UPPER EXT VEIN MAPPING (PRE-OP AVF)  Result Date: 08/24/2020 UPPER EXTREMITY VEIN MAPPING Patient Name:  Craig Flynn  Date of Exam:   08/24/2020 Medical Rec #: [id number]      Accession #:    [id number] Date of Birth: 1960-06-02     Patient Gender: M Patient Age:   15Y Exam Location:  [facility] Procedure:      VAS [place] UPPER EXT VEIN MAPPING (PRE-OP AVF) Referring Phys: [id number] JAY PATEL --------------------------------------------------------------------------------  History: Left failed AVF upper arm.  Comparison Study: 09/14/2019  Performing Technologist: Oda Cogan RDMS, RVT  Examination Guidelines: A complete evaluation includes B-mode imaging, spectral Doppler, color Doppler, and power Doppler as needed of all accessible portions of each vessel. Bilateral testing is considered an integral part of a complete examination. Limited examinations for reoccurring indications may be performed as noted. +-----------------+-------------+----------+--------+ Right Cephalic   Diameter (cm)Depth (cm)Findings +-----------------+-------------+----------+--------+ Prox upper arm       0.45        1.00            +-----------------+-------------+----------+--------+ Mid upper arm        0.27        0.55            +-----------------+-------------+----------+--------+ Dist upper arm       0.42        0.65            +-----------------+-------------+----------+--------+ Antecubital fossa    0.45        0.38            +-----------------+-------------+----------+--------+ Prox forearm         0.26        0.49            +-----------------+-------------+----------+--------+ Mid forearm          0.14                        +-----------------+-------------+----------+--------+ +-----------------+-------------+----------+---------+ Right Basilic    Diameter (cm)Depth (cm)Findings  +-----------------+-------------+----------+---------+ Prox upper arm       0.56                         +-----------------+-------------+----------+---------+ Mid upper arm        0.41                         +-----------------+-------------+----------+---------+ Dist upper arm       0.34               branching +-----------------+-------------+----------+---------+ Antecubital fossa    0.29                         +-----------------+-------------+----------+---------+ Prox forearm         0.22                         +-----------------+-------------+----------+---------+  Mid forearm          0.19                branching +-----------------+-------------+----------+---------+ +-----------------+-------------+----------+---------+ Left Cephalic    Diameter (cm)Depth (cm)Findings  +-----------------+-------------+----------+---------+ Antecubital fossa    0.29               branching +-----------------+-------------+----------+---------+ Prox forearm         0.17                         +-----------------+-------------+----------+---------+ +-----------------+-------------+----------+---------+ Left Basilic     Diameter (cm)Depth (cm)Findings  +-----------------+-------------+----------+---------+ Shoulder             0.93                         +-----------------+-------------+----------+---------+ Prox upper arm       0.50                         +-----------------+-------------+----------+---------+ Mid upper arm        0.29               branching +-----------------+-------------+----------+---------+ Dist upper arm       0.36                         +-----------------+-------------+----------+---------+ Antecubital fossa    0.31               branching +-----------------+-------------+----------+---------+ Prox forearm         0.13                         +-----------------+-------------+----------+---------+ Mid forearm          0.16                         +-----------------+-------------+----------+---------+ *See table(s) above for measurements and observations.  Diagnosing physician:    Preliminary      Scheduled Meds:   . allopurinol  100 mg Oral Daily  . Chlorhexidine Gluconate Cloth  6 each Topical Q0600  . heparin  5,000 Units Subcutaneous Q8H  . levothyroxine  137 mcg Oral Q0600  . polyethylene glycol  17 g Oral Daily  . senna-docusate  2 tablet Oral QHS    Continuous Infusions:      LOS: 2 days     Vernell Leep, MD, [place], [facility]. [facility]    To contact the attending provider between 7A-7P or the covering provider  during after hours 7P-7A, please log into the web site [email or web address] and access using universal [place] password for that web site. If you do not have the password, please call the hospital operator.  08/24/2020, 3:54 PM

## 2020-08-24 NOTE — Progress Notes (Signed)
Patient ID: Craig Flynn, male   DOB: 1960-12-12, 60 y.o.   MRN: [id number] [facility] Progress Note   Assessment/ Plan:   1.  Acute pancreatitis:  Appears to be improving symptomatically with bowel rest and now started on soft nonfatty diet.  Agree with discontinuation of intravenous fluids at this time as oral intake appears to have picked up with regards to fluid intake. 2.  End-stage renal disease: With underlying progressive chronic kidney disease and now development of assorted uremic symptoms prompting initiation of dialysis-first treatment yesterday and the second will be today.  He underwent placement of TDC by IR on 6/2 and was seen yesterday by vascular surgery for permanent dialysis access planning.  Process initiated for outpatient dialysis unit placement. 3.  Anemia of chronic disease: We will continue to follow hemoglobin/hematocrit trend closely to decide on need for intravenous iron/ESA. 4.  Chronic kidney disease/metabolic bone disease: He was hypercalcemic as an outpatient and calcitriol has been discontinued, he appears to have early calciphylaxis of the right leg and it is imperative to avoid calcium containing binders and VDRA along with vigilant monitoring for wounds. 5.  Hypertension: Monitor with ongoing pain management and hemodialysis/ultrafiltration.  Subjective:   Hypoglycemia this morning with prompt improvement.  He laments food choices/abdominal pain.   Objective:   BP (!) 148/69 (BP Location: Left Arm)   Pulse 67   Temp 98.4 F (36.9 C) (Oral)   Resp 19   Ht 6\' 2"  (1.88 m)   Wt 115 kg   SpO2 100%   BMI 32.55 kg/m   Physical Exam: Gen: Resting comfortably in bed CVS: Pulse regular rhythm, normal rate, S1 and S2 with ejection systolic murmur Resp: Diminished breath sounds over bases from poor inspiratory effort, no rales/rhonchi.  Right IJ TDC Abd: Guarding without rigidity, tenderness noted over upper quadrants Ext: 1+ lower extremity  edema with focal area of nodularity/hyperpigmentation over right leg that appears to be consistent with early calciphylaxis  Labs: BMET Recent Labs  Lab 08/21/20 2241 08/22/20 0506 08/22/20 0621 08/23/20 0054 08/24/20 0054  NA 128* 129* 131* 131* 133*  K 3.5 3.8 3.8 3.7 3.2*  CL 84* 94*  --  93* 95*  CO2 19* 20*  --  22 25  GLUCOSE 130* 109*  --  83 67*  BUN 153* 154*  --  156* 107*  CREATININE 15.56* 15.48*  --  15.53* 11.09*  CALCIUM 12.6* 11.7*  --  10.6* 9.8  PHOS  --  7.1*  --   --   --    CBC Recent Labs  Lab 08/21/20 2241 08/22/20 0506 08/22/20 0621 08/23/20 0054 08/23/20 1906 08/24/20 0054  WBC 8.9 9.1  --  8.8 8.8 7.4  NEUTROABS 8.0*  --   --   --   --   --   HGB 10.8* 9.9* 10.5* 9.3* 10.3* 9.4*  HCT 32.2* 30.8* 31.0* 28.4* 30.6* 28.7*  MCV 81.9 84.2  --  81.6 81.2 82.0  PLT 256 191  --  165 175 167     Medications:    . allopurinol  100 mg Oral Daily  . bisacodyl  10 mg Rectal Once  . Chlorhexidine Gluconate Cloth  6 each Topical Q0600  . heparin  5,000 Units Subcutaneous Q8H  . levothyroxine  137 mcg Oral Q0600  . polyethylene glycol  17 g Oral Daily  . senna-docusate  2 tablet Oral QHS   Elmarie Shiley, MD 08/24/2020, 10:06 AM

## 2020-08-24 NOTE — Progress Notes (Signed)
HISTORY OF PRESENT ILLNESS:  Craig Flynn is a 60 y.o. male multiple medical problems including end-stage renal disease for which she was started on dialysis yesterday.  Presented to the hospital with acute pancreatitis of undetermined etiology.  Mild changes on imaging.  Doing better today.  Still with abdominal discomfort but is tolerating his diet without worsening pain, nausea, or vomiting.  His dialysis session went well.  Overall he states that he is feeling better.  No new complaints.  Lipase has improved.  REVIEW OF SYSTEMS:  All non-GI ROS negative.  Past Medical History:  Diagnosis Date  . Anemia 06/2015  . Arthritis    knee  . Cancer [facility])    thyroid cancer  . Cardiomyopathy ([place])    a. 10/2013: EF reduced to 35-40% b. 09/2014: EF improved to 55-60%, Grade 1 DD noted.  . Chest pain 06/2015  . CHF (congestive heart failure) ([place])   . CKD (chronic kidney disease), stage IV [facility])    sees  Dr Lorrene Reid  . Dyspnea    with exerion   . GERD (gastroesophageal reflux disease)   . Gout   . Hypertension   . Obesity   . Sleep apnea    not wearing c-pap now, needs new slep study per pt. (01/22/2017)  . Tuberculosis 1981   while the pt was in the service.  . Tubular adenoma of colon 10/2015  . Wears glasses     Past Surgical History:  Procedure Laterality Date  . AV FISTULA PLACEMENT Left 03/25/2016   Procedure: Creation of Left Arm ARTERIOVENOUS (AV) FISTULA;  Surgeon: Elam Dutch, MD;  Location: [facility] OR;  Service: Vascular;  Laterality: Left;  . COLONOSCOPY W/ BIOPSIES AND POLYPECTOMY     "no problem"  . HERNIA REPAIR    . IR FLUORO GUIDE CV LINE RIGHT  08/22/2020  . IR US GUIDE VASC ACCESS RIGHT  08/22/2020  . LAPAROSCOPIC CHOLECYSTECTOMY    . LIGATION OF COMPETING BRANCHES OF ARTERIOVENOUS FISTULA Left 01/25/2018   Procedure: LIGATION OF COMPETING BRANCHES And Revision of ARTERIOVENOUS FISTULA LEFT ARM.;  Surgeon: Elam Dutch, MD;  Location: [place];  Service: Vascular;   Laterality: Left;  . THYROIDECTOMY  01/22/2017  . THYROIDECTOMY Left 01/22/2017   Procedure: THYROIDECTOMY;  Surgeon: Melida Quitter, MD;  Location: [place];  Service: ENT;  Laterality: Left;  . THYROIDECTOMY Right 03/26/2017   Procedure: COMPLETION OF THYROIDECTOMY;  Surgeon: Melida Quitter, MD;  Location: [place];  Service: ENT;  Laterality: Right;  . UMBILICAL HERNIA REPAIR    . WISDOM TOOTH EXTRACTION      Social History BRITTANY AMIRAULT  reports that he has been smoking cigarettes. He has a 20.00 pack-year smoking history. He has never used smokeless tobacco. He reports previous alcohol use. He reports that he does not use drugs.  family history includes Healthy in his maternal grandfather, paternal grandfather, and paternal grandmother; Hypertension in his maternal grandmother; Kidney disease in his maternal grandmother; Pneumonia in his father; Stroke in his mother.  No Known Allergies     PHYSICAL EXAMINATION:  Vital signs: BP (!) 148/69 (BP Location: Left Arm)   Pulse 67   Temp 98.4 F (36.9 C) (Oral)   Resp 19   Ht 6\' 2"  (1.88 m)   Wt 115 kg   SpO2 100%   BMI 32.55 kg/m   Constitutional: generally well-appearing, no acute distress Psychiatric: alert and oriented x3, cooperative Eyes: extraocular movements intact, anicteric, conjunctiva pink Mouth: oral  pharynx moist, no lesions Neck: supple no lymphadenopathy Cardiovascular: heart regular rate and rhythm, no murmur Lungs: clear to auscultation bilaterally Abdomen: soft, obese with mild epigastric tenderness to palpation, nondistended, no obvious ascites, no peritoneal signs, normal bowel sounds, no organomegaly Rectal: Omitted Extremities: no clubbing or cyanosis.  1+ lower extremity edema bilaterally Skin: no lesions on visible extremities Neuro: No focal deficits.  Cranial nerves intact  ASSESSMENT:  1.  Acute pancreatitis.  Mild and improving.  Etiology undetermined.  Not biliary.  Not alcohol.  Not triglycerides. 2.   End-stage renal disease.  Recently started dialysis as inpatient. 3.  Multiple medical problems  PLAN:  1.  Low-fat diet 2.  Continue with inpatient dialysis as planned 3.  Okay to discharge home from GI perspective if tolerating low-fat diet and not requiring significant pain medication. 4.  We will get him outpatient GI follow-up regarding further work-up of his pancreatitis.  6 to 8 weeks after recovering from this bout of pancreatitis he would benefit from advanced imaging of the pancreas to rule out underlying occult lesion.  His primary GI doctor is Dr. [name].  I discussed this with the patient. We will sign off.  Docia Chuck. Geri Seminole., M.D. [facility] Division of Gastroenterology

## 2020-08-24 NOTE — Progress Notes (Signed)
Patient's glucose from morning's labwork was 67. Recheck CBG with glucometer per order showed 68, gave 120 ml ginger ale per hypoglycemic protocol, recheck CBG at 0651 and resulted to 68, recheck CBG again at 0655 and resulted to 70.  Attending MD was made aware thru text page. Patient is asymptomatic.  Will endorse to day shift RN accordingly.

## 2020-08-25 LAB — GLUCOSE, CAPILLARY
Glucose-Capillary: 57 mg/dL — ABNORMAL LOW (ref 70–99)
Glucose-Capillary: 81 mg/dL (ref 70–99)
Glucose-Capillary: 89 mg/dL (ref 70–99)
Glucose-Capillary: 95 mg/dL (ref 70–99)
Glucose-Capillary: 98 mg/dL (ref 70–99)

## 2020-08-25 MED ORDER — POTASSIUM CHLORIDE CRYS ER 20 MEQ PO TBCR
30.0000 meq | EXTENDED_RELEASE_TABLET | Freq: Once | ORAL | Status: AC
  Administered 2020-08-25: 30 meq via ORAL
  Filled 2020-08-25: qty 1

## 2020-08-25 MED ORDER — DARBEPOETIN ALFA 60 MCG/0.3ML IJ SOSY
60.0000 ug | PREFILLED_SYRINGE | INTRAMUSCULAR | Status: DC
  Administered 2020-08-26: 60 ug via INTRAVENOUS
  Filled 2020-08-25: qty 0.3

## 2020-08-25 MED ORDER — SENNOSIDES-DOCUSATE SODIUM 8.6-50 MG PO TABS
2.0000 | ORAL_TABLET | Freq: Two times a day (BID) | ORAL | Status: DC
  Administered 2020-08-25 – 2020-08-28 (×4): 2 via ORAL
  Filled 2020-08-25 (×4): qty 2

## 2020-08-25 NOTE — Progress Notes (Signed)
PROGRESS NOTE   Craig Flynn  WHQ:[id number]    DOB: Mar 23, 1961    DOA: 08/21/2020  PCP: Biagio Borg, MD   I have briefly reviewed patients previous medical records in [facility].  Chief Complaint  Patient presents with  . Abdominal Pain  . Emesis  . Nausea  . Hernia    Brief Narrative:  60 year old male with medical history significant for and not limited to prior cholecystectomy ("10-15 years ago"), CKD 5 (with plan for HD access and starting HD soon), thyroid cancer/post thyroidectomy hypothyroidism, essential hypertension, GERD, gout, anemia, chronic diastolic CHF who presented to ED with 1 day history of severe periumbilical pain with associated nausea, vomiting and constipation.  Admitted for acute pancreatitis without complicating features and progression to ESRD with early uremic symptoms.  Nephrology consulted, [facility] placed by IR, plans to start HD 6/3.  VVS consulted and plan permanent vascular access next week.  Due to persistent pain and markedly elevated lipase, [place] [facility] consult.  Improving.   Assessment & Plan:  Active Problems:   Acute pancreatitis   Acute pancreatitis: Lipase 1378 on admission.  LFTs unremarkable.  CT abdomen and pelvis showed acute uncomplicated pancreatitis without fluid collection, pseudocyst or abscess.  S/p remote cholecystectomy.  TG: 162.  No history of alcohol use, recent medication changes or new OTC medication use for insect bites.  Denies prior episodes of pancreatitis.  Etiology unclear.?  Idiopathic.  Treated supportively with bowel rest, slowly advance diet to nonfatty diet, brief IV fluids for about 48 hours and pain management.  Due to persistent pain and persistent elevated lipase, [facility] was consulted.  Persistently elevated lipase likely due to ESRD and poor renal clearance.  Lipase improved after HD.  Symptomatically better but with still intermittent abdominal pain-encouraged trial of oral opioids more than IVs.  Constipation  may be adding to his abdominal discomfort.  Increase bowel regimen.  GI signed off and recommend outpatient follow-up for advanced pancreatic imaging in 6 to 8 weeks and will arrange follow-up with Dr. [name].  New ESRD with early uremia/high anion gap metabolic acidosis: Presented with creatinine >15.  Follows with Dr. [name], nephrology with plans for HD port placement sometime this week and initiation of HD next week as OP.  Nephrology consulted, [place] placed by IR, started HD 6/3.  Ongoing HD per nephrology.  VVS consulted and plan for staged left basilic fistula on 6/7  Hypercalcemia: Unclear etiology.  Presented with serum calcium of 12.6 which has come down to 11.7 post hydration.  Could be related to dehydration versus other etiologies.  Almost resolved with IV fluids.  Avoid Tums which patient reported taking lots of for acid reflux.  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  Resolved.  Chronic constipation: Last BM approximately 2 days ago.  Trial of OTC laxatives and MOM at home without much effect.  Aggressive bowel regimen initiated.  Had couple of small BMs yesterday.  Increase bowel regimen  Essential hypertension: Had soft blood pressures earlier on in admission, now blood pressures trending up.  Monitor closely and consider resuming antihypertensives.  Anemia in chronic kidney disease: Hemoglobin stable in the 9-10 range.  Nephrology planning Aranesp with HD 6/6.  Post thyroidectomy hypothyroidism: Continue levothyroxine.  GERD: PPI  Gout: No acute flare.  Continue allopurinol.  Hypoglycemia: CBG  67 on 6/4 AM.  Not diabetic.  On no hypoglycemic meds.  Possibly due to ESRD.  No further episodes.  DC CBG checks.  Can do as needed.  Body mass index is 32.55 kg/m.    DVT prophylaxis: heparin  injection 5,000 Units Start: 08/22/20 2200 SCDs Start: 08/22/20 0444     Code Status: Full Code Family Communication: None at bedside. Disposition:  Status is: Inpatient  Remains inpatient appropriate because:Inpatient level of care appropriate due to severity of illness   Dispo:  Patient From: Home  Planned Disposition: Home  Medically stable for discharge: No          Consultants:   Nephrology Interventional radiology  Procedures:   TDC placed by IR Hemodialysis  Antimicrobials:    Anti-infectives (From admission, onward)   Start     Dose/Rate Route Frequency Ordered Stop   08/22/20 1300  ceFAZolin (ANCEF) IVPB 2g/100 mL premix        2 g 200 mL/hr over 30 Minutes Intravenous  Once 08/22/20 1251 08/22/20 1458        Subjective:  Couple of small BMs yesterday.  Ongoing abdominal pain, still rates 7/10.  Used multiple doses of IV Dilaudid yesterday.  No nausea or vomiting.  Objective:   Vitals:   08/24/20 2017 08/25/20 0546 08/25/20 1108 08/25/20 1501  BP: (!) 144/74 136/72 130/70 109/66  Pulse: 76 72 66 79  Resp:   19 18  Temp: 98.4 F (36.9 C) 98.5 F (36.9 C) 98.1 F (36.7 C) 98.5 F (36.9 C)  TempSrc: Oral Oral Oral Oral  SpO2: 100% 100% 100% 100%  Weight:      Height:        General exam: Young male, moderately built and nourished, lying comfortably in bed Respiratory system: Clear to auscultation.  No increased work of breathing. Cardiovascular system: S1 and S2 heard, RRR.  No JVD, murmurs or pedal edema. Gastrointestinal system: Abdomen is nondistended, soft and nontender.  No organomegaly or masses.  Normal bowel sounds heard. Central nervous system: Alert and oriented. No focal neurological deficits. Extremities: Symmetric 5 x 5 power. Skin: Small area of subcutaneous nodularity on either side of right lower ankle, likely calciphylaxis. Psychiatry: Judgement and insight appear normal. Mood & affect appropriate.     Data Reviewed:   I  have personally reviewed following labs and imaging studies   CBC: Recent Labs  Lab 08/21/20 2241 08/22/20 0506 08/23/20 0054 08/23/20 1906 08/24/20 0054  WBC 8.9   < > 8.8 8.8 7.4  NEUTROABS 8.0*  --   --   --   --   HGB 10.8*   < > 9.3* 10.3* 9.4*  HCT 32.2*   < > 28.4* 30.6* 28.7*  MCV 81.9   < > 81.6 81.2 82.0  PLT 256   < > 165 175 167   < > = values in this interval not displayed.    Basic Metabolic Panel: Recent Labs  Lab 08/22/20 0506 08/22/20 0621 08/23/20 0054 08/24/20 0054  NA 129* 131* 131* 133*  K 3.8 3.8 3.7 3.2*  CL 94*  --  93* 95*  CO2 20*  --  22 25  GLUCOSE 109*  --  83 67*  BUN 154*  --  156* 107*  CREATININE 15.48*  --  15.53* 11.09*  CALCIUM 11.7*  --  10.6* 9.8  MG 2.0  --   --   --   PHOS 7.1*  --   --   --  Liver Function Tests: Recent Labs  Lab 08/22/20 0506 08/23/20 0054 08/24/20 0054  AST 18 16 20   ALT 15 15 11   ALKPHOS 28* 28* 33*  BILITOT 0.6 0.6 0.6  PROT 6.5 6.1* 6.2*  ALBUMIN 3.9 3.5 3.3*    CBG: Recent Labs  Lab 08/25/20 0037 08/25/20 0546 08/25/20 1131  GLUCAP 95 98 89    Microbiology Studies:   Recent Results (from the past 240 hour(s))  Resp Panel by RT-PCR (Flu A&B, Covid) Nasopharyngeal Swab     Status: None   Collection Time: 08/22/20  2:55 AM   Specimen: Nasopharyngeal Swab; Nasopharyngeal(NP) swabs in vial transport medium  Result Value Ref Range Status   SARS Coronavirus 2 by RT PCR NEGATIVE NEGATIVE Final    Comment: (NOTE) SARS-CoV-2 target nucleic acids are NOT DETECTED.  The SARS-CoV-2 RNA is generally detectable in upper respiratory specimens during the acute phase of infection. The lowest concentration of SARS-CoV-2 viral copies this assay can detect is 138 copies/mL. A negative result does not preclude SARS-Cov-2 infection and should not be used as the sole basis for treatment or other patient management decisions. A negative result may occur with  improper specimen collection/handling,  submission of specimen other than nasopharyngeal swab, presence of viral mutation(s) within the areas targeted by this assay, and inadequate number of viral copies(<138 copies/mL). A negative result must be combined with clinical observations, patient history, and epidemiological information. The expected result is Negative.  Fact Sheet for Patients:  EntrepreneurPulse.com.au  Fact Sheet for Healthcare Providers:  IncredibleEmployment.be  This test is no t yet approved or cleared by the [place] FDA and  has been authorized for detection and/or diagnosis of SARS-CoV-2 by FDA under an Emergency Use Authorization (EUA). This EUA will remain  in effect (meaning this test can be used) for the duration of the COVID-19 declaration under Section 564(b)(1) of the Act, 21 U.S.C.section 360bbb-3(b)(1), unless the authorization is terminated  or revoked sooner.       Influenza A by PCR NEGATIVE NEGATIVE Final   Influenza B by PCR NEGATIVE NEGATIVE Final    Comment: (NOTE) The Xpert Xpress SARS-CoV-2/FLU/RSV plus assay is intended as an aid in the diagnosis of influenza from Nasopharyngeal swab specimens and should not be used as a sole basis for treatment. Nasal washings and aspirates are unacceptable for Xpert Xpress SARS-CoV-2/FLU/RSV testing.  Fact Sheet for Patients: EntrepreneurPulse.com.au  Fact Sheet for Healthcare Providers: IncredibleEmployment.be  This test is not yet approved or cleared by the [place] FDA and has been authorized for detection and/or diagnosis of SARS-CoV-2 by FDA under an Emergency Use Authorization (EUA). This EUA will remain in effect (meaning this test can be used) for the duration of the COVID-19 declaration under Section 564(b)(1) of the Act, 21 U.S.C. section 360bbb-3(b)(1), unless the authorization is terminated or revoked.  Performed at [facility], [place] [address], [place], [place] [address]      Radiology Studies:  VAS [place] UPPER EXT VEIN MAPPING (PRE-OP AVF)  Result Date: 08/24/2020 UPPER EXTREMITY VEIN MAPPING Patient Name:  Craig Flynn  Date of Exam:   08/24/2020 Medical Rec #: [id number]      Accession #:    [id number] Date of Birth: Oct 22, 1960     Patient Gender: M Patient Age:   44Y Exam Location:  [facility] Procedure:      VAS [place] UPPER EXT VEIN MAPPING (PRE-OP AVF) Referring Phys: [id number] JAY PATEL --------------------------------------------------------------------------------  History: Left failed  AVF upper arm.  Comparison Study: 09/14/2019 Performing Technologist: Oda Cogan RDMS, RVT  Examination Guidelines: A complete evaluation includes B-mode imaging, spectral Doppler, color Doppler, and power Doppler as needed of all accessible portions of each vessel. Bilateral testing is considered an integral part of a complete examination. Limited examinations for reoccurring indications may be performed as noted. +-----------------+-------------+----------+--------+ Right Cephalic   Diameter (cm)Depth (cm)Findings +-----------------+-------------+----------+--------+ Prox upper arm       0.45        1.00            +-----------------+-------------+----------+--------+ Mid upper arm        0.27        0.55            +-----------------+-------------+----------+--------+ Dist upper arm       0.42        0.65            +-----------------+-------------+----------+--------+ Antecubital fossa    0.45        0.38            +-----------------+-------------+----------+--------+ Prox forearm         0.26        0.49            +-----------------+-------------+----------+--------+ Mid forearm          0.14                        +-----------------+-------------+----------+--------+ +-----------------+-------------+----------+---------+ Right Basilic    Diameter (cm)Depth (cm)Findings   +-----------------+-------------+----------+---------+ Prox upper arm       0.56                         +-----------------+-------------+----------+---------+ Mid upper arm        0.41                         +-----------------+-------------+----------+---------+ Dist upper arm       0.34               branching +-----------------+-------------+----------+---------+ Antecubital fossa    0.29                         +-----------------+-------------+----------+---------+ Prox forearm         0.22                         +-----------------+-------------+----------+---------+ Mid forearm          0.19               branching +-----------------+-------------+----------+---------+ +-----------------+-------------+----------+---------+ Left Cephalic    Diameter (cm)Depth (cm)Findings  +-----------------+-------------+----------+---------+ Antecubital fossa    0.29               branching +-----------------+-------------+----------+---------+ Prox forearm         0.17                         +-----------------+-------------+----------+---------+ +-----------------+-------------+----------+---------+ Left Basilic     Diameter (cm)Depth (cm)Findings  +-----------------+-------------+----------+---------+ Shoulder             0.93                         +-----------------+-------------+----------+---------+ Prox upper arm       0.50                         +-----------------+-------------+----------+---------+  Mid upper arm        0.29               branching +-----------------+-------------+----------+---------+ Dist upper arm       0.36                         +-----------------+-------------+----------+---------+ Antecubital fossa    0.31               branching +-----------------+-------------+----------+---------+ Prox forearm         0.13                         +-----------------+-------------+----------+---------+ Mid forearm           0.16                         +-----------------+-------------+----------+---------+ *See table(s) above for measurements and observations.  Diagnosing physician:    Preliminary      Scheduled Meds:   . allopurinol  100 mg Oral Daily  . Chlorhexidine Gluconate Cloth  6 each Topical Q0600  . [START ON 08/26/2020] darbepoetin (ARANESP) injection - DIALYSIS  60 mcg Intravenous Q Mon-HD  . heparin  5,000 Units Subcutaneous Q8H  . levothyroxine  137 mcg Oral Q0600  . polyethylene glycol  17 g Oral BID  . senna-docusate  2 tablet Oral QHS    Continuous Infusions:      LOS: 3 days     Vernell Leep, MD, [place], [facility]. [facility]    To contact the attending provider between 7A-7P or the covering provider during after hours 7P-7A, please log into the web site [email or web address] and access using universal [place] password for that web site. If you do not have the password, please call the hospital operator.  08/25/2020, 4:09 PM

## 2020-08-25 NOTE — Progress Notes (Addendum)
Patient ID: Craig Flynn, male   DOB: 06-Dec-1960, 60 y.o.   MRN: [id number] [facility] Progress Note   Assessment/ Plan:   1.  Acute pancreatitis:  Appears to be improving symptomatically with bowel rest and now started on soft nonfatty diet.  Appears to have clinical improvement along with improvement of lipase levels. 2.  End-stage renal disease: With underlying progressive chronic kidney disease and now development of assorted uremic symptoms prompting initiation of dialysis-had  2nd dialysis treatment yesterday and will get his third tomorrow.  He underwent  andplacement of TDC by IR on 6/2 and plans noted by vascular surgery for first stage left brachiobasilic fistula tomorrow. 3.  Anemia of chronic disease: Hemoglobin and hematocrit trending down, will give Aranesp with hemodialysis tomorrow. 4.  Chronic kidney disease/metabolic bone disease: He was hypercalcemic as an outpatient and calcium level now trending down.  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 5.  Hypertension: Monitor with ongoing pain management and hemodialysis/ultrafiltration. 6.  Hypokalemia: Ordered for a single dose of potassium this morning.  Subjective:   Reports that he had a better night with improvement of abdominal pain, complains of constipation and received MiraLAX this morning.   Objective:   BP 136/72 (BP Location: Left Arm)   Pulse 72   Temp 98.5 F (36.9 C) (Oral)   Resp (!) 21   Ht 6\' 2"  (1.88 m)   Wt 115 kg   SpO2 100%   BMI 32.55 kg/m   Physical Exam: Gen: Resting comfortably in bed CVS: Pulse regular rhythm, normal rate, S1 and S2 with ejection systolic murmur Resp: Clear to auscultation bilaterally without rales or rhonchi.  Right IJ TDC Abd: Firm, distended, no significant tenderness, bowel sounds normal Ext: 1+ lower  extremity edema with focal area of nodularity/hyperpigmentation over right leg that appears to be consistent with early calciphylaxis  Labs: BMET Recent Labs  Lab 08/21/20 2241 08/22/20 0506 08/22/20 0621 08/23/20 0054 08/24/20 0054  NA 128* 129* 131* 131* 133*  K 3.5 3.8 3.8 3.7 3.2*  CL 84* 94*  --  93* 95*  CO2 19* 20*  --  22 25  GLUCOSE 130* 109*  --  83 67*  BUN 153* 154*  --  156* 107*  CREATININE 15.56* 15.48*  --  15.53* 11.09*  CALCIUM 12.6* 11.7*  --  10.6* 9.8  PHOS  --  7.1*  --   --   --    CBC Recent Labs  Lab 08/21/20 2241 08/22/20 0506 08/22/20 0621 08/23/20 0054 08/23/20 1906 08/24/20 0054  WBC 8.9 9.1  --  8.8 8.8 7.4  NEUTROABS 8.0*  --   --   --   --   --   HGB 10.8* 9.9* 10.5* 9.3* 10.3* 9.4*  HCT 32.2* 30.8* 31.0* 28.4* 30.6* 28.7*  MCV 81.9 84.2  --  81.6 81.2 82.0  PLT 256 191  --  165 175 167     Medications:    . allopurinol  100 mg Oral Daily  . Chlorhexidine Gluconate Cloth  6 each Topical Q0600  . heparin  5,000 Units Subcutaneous Q8H  . levothyroxine  137 mcg Oral Q0600  . polyethylene glycol  17 g Oral BID  . senna-docusate  2 tablet Oral QHS   Elmarie Shiley, MD 08/25/2020, 8:48 AM

## 2020-08-25 NOTE — Progress Notes (Signed)
HYPOGLYCEMIC EVENT  -- Nondiabetic patient had CBG of 57 at 1726. Based on patients report he is nonsymptomatic.He states he did not eat lunch. I instructed nurse tech Lauren to give patient juice and recheck in 15 minutes. MD Hongalgi notified. Will continue to check CBG as ordered and encourage patient to eat graham crackers in between meals per Dr. Algis Liming.

## 2020-08-25 NOTE — Progress Notes (Signed)
[facility]  Subjective  - feels ok   Objective 136/72 72 98.5 F (36.9 C) (Oral) (!) 21 100%  Intake/Output Summary (Last 24 hours) at 08/25/2020 0841 Last data filed at 08/24/2020 1815 Gross per 24 hour  Intake 1966 ml  Output 500 ml  Net 1466 ml    Assessment/Planning: Vein map reviewed first stage left basilic fistula on Tuesday Procedure details risk benefit discussed with pt including but not limited to bleeding infection 1% non maturation of fistula 10-15% steal 1-2 percent  Ruta Hinds 08/25/2020 8:41 AM --  Laboratory Lab Results: Recent Labs    08/23/20 1906 08/24/20 0054  WBC 8.8 7.4  HGB 10.3* 9.4*  HCT 30.6* 28.7*  PLT 175 167   BMET Recent Labs    08/23/20 0054 08/24/20 0054  NA 131* 133*  K 3.7 3.2*  CL 93* 95*  CO2 22 25  GLUCOSE 83 67*  BUN 156* 107*  CREATININE 15.53* 11.09*  CALCIUM 10.6* 9.8    COAG No results found for: INR, PROTIME No results found for: PTT

## 2020-08-25 NOTE — Plan of Care (Signed)
  Problem: Education: Goal: Knowledge of General Education information will improve Description Including pain rating scale, medication(s)/side effects and non-pharmacologic comfort measures Outcome: Progressing   

## 2020-08-26 LAB — CBC
HCT: 25.7 % — ABNORMAL LOW (ref 39.0–52.0)
Hemoglobin: 8.2 g/dL — ABNORMAL LOW (ref 13.0–17.0)
MCH: 27.2 pg (ref 26.0–34.0)
MCHC: 31.9 g/dL (ref 30.0–36.0)
MCV: 85.4 fL (ref 80.0–100.0)
Platelets: 167 10*3/uL (ref 150–400)
RBC: 3.01 MIL/uL — ABNORMAL LOW (ref 4.22–5.81)
RDW: 12.8 % (ref 11.5–15.5)
WBC: 6.1 10*3/uL (ref 4.0–10.5)
nRBC: 0 % (ref 0.0–0.2)

## 2020-08-26 LAB — COMPREHENSIVE METABOLIC PANEL
ALT: 10 U/L (ref 0–44)
AST: 17 U/L (ref 15–41)
Albumin: 3 g/dL — ABNORMAL LOW (ref 3.5–5.0)
Alkaline Phosphatase: 29 U/L — ABNORMAL LOW (ref 38–126)
Anion gap: 9 (ref 5–15)
BUN: 73 mg/dL — ABNORMAL HIGH (ref 6–20)
CO2: 27 mmol/L (ref 22–32)
Calcium: 9.2 mg/dL (ref 8.9–10.3)
Chloride: 97 mmol/L — ABNORMAL LOW (ref 98–111)
Creatinine, Ser: 8.79 mg/dL — ABNORMAL HIGH (ref 0.61–1.24)
GFR, Estimated: 6 mL/min — ABNORMAL LOW (ref >60)
Glucose, Bld: 99 mg/dL (ref 70–99)
Potassium: 3.8 mmol/L (ref 3.5–5.1)
Sodium: 133 mmol/L — ABNORMAL LOW (ref 135–145)
Total Bilirubin: 0.5 mg/dL (ref 0.3–1.2)
Total Protein: 5.8 g/dL — ABNORMAL LOW (ref 6.5–8.1)

## 2020-08-26 LAB — GLUCOSE, CAPILLARY
Glucose-Capillary: 115 mg/dL — ABNORMAL HIGH (ref 70–99)
Glucose-Capillary: 48 mg/dL — ABNORMAL LOW (ref 70–99)
Glucose-Capillary: 51 mg/dL — ABNORMAL LOW (ref 70–99)
Glucose-Capillary: 52 mg/dL — ABNORMAL LOW (ref 70–99)
Glucose-Capillary: 57 mg/dL — ABNORMAL LOW (ref 70–99)
Glucose-Capillary: 67 mg/dL — ABNORMAL LOW (ref 70–99)
Glucose-Capillary: 75 mg/dL (ref 70–99)
Glucose-Capillary: 84 mg/dL (ref 70–99)
Glucose-Capillary: 95 mg/dL (ref 70–99)

## 2020-08-26 MED ORDER — HEPARIN SODIUM (PORCINE) 1000 UNIT/ML IJ SOLN
INTRAMUSCULAR | Status: AC
  Administered 2020-08-26: 1000 [IU]
  Filled 2020-08-26: qty 4

## 2020-08-26 MED ORDER — DARBEPOETIN ALFA 60 MCG/0.3ML IJ SOSY
PREFILLED_SYRINGE | INTRAMUSCULAR | Status: AC
  Filled 2020-08-26: qty 0.3

## 2020-08-26 NOTE — H&P (View-Only) (Signed)
  Progress Note    08/26/2020 7:27 AM * No surgery date entered *  Subjective:  No complaints.  All questions answered regarding dialysis access surgery   Vitals:   08/25/20 2015 08/26/20 0459  BP: 123/65 (!) 113/59  Pulse: 77 79  Resp: 18 18  Temp: 98.3 F (36.8 C) 98.2 F (36.8 C)  SpO2: 95% 96%   Physical Exam: Lungs:  Non labored Extremities:  Symmetrical radial pulses Neurologic: A&O  CBC    Component Value Date/Time   WBC 7.4 08/24/2020 0054   RBC 3.50 (L) 08/24/2020 0054   HGB 9.4 (L) 08/24/2020 0054   HCT 28.7 (L) 08/24/2020 0054   PLT 167 08/24/2020 0054   MCV 82.0 08/24/2020 0054   MCH 26.9 08/24/2020 0054   MCHC 32.8 08/24/2020 0054   RDW 12.5 08/24/2020 0054   LYMPHSABS 0.3 (L) 08/21/2020 2241   MONOABS 0.4 08/21/2020 2241   EOSABS 0.1 08/21/2020 2241   BASOSABS 0.0 08/21/2020 2241    BMET    Component Value Date/Time   NA 133 (L) 08/24/2020 0054   K 3.2 (L) 08/24/2020 0054   CL 95 (L) 08/24/2020 0054   CO2 25 08/24/2020 0054   GLUCOSE 67 (L) 08/24/2020 0054   GLUCOSE 130 (H) 03/03/2006 0910   BUN 107 (H) 08/24/2020 0054   CREATININE 11.09 (H) 08/24/2020 0054   CREATININE 4.09 (H) 09/12/2015 1529   CALCIUM 9.8 08/24/2020 0054   GFRNONAA 5 (L) 08/24/2020 0054   GFRAA 11 (L) 11/06/2019 0014    INR No results found for: INR   Intake/Output Summary (Last 24 hours) at 08/26/2020 0727 Last data filed at 08/25/2020 1330 Gross per 24 hour  Intake 300 ml  Output --  Net 300 ml     Assessment/Plan:  60 y.o. male with ESRD on HD  Plan is for L arm fistula creation versus graft placement tomorrow 08/27/20 with Dr. Oneida Alar Risks of surgery discussed and all questions answered; patient is willing to proceed NPO past midnight Consent   Craig Ligas, PA-C [facility] [phone] 08/26/2020 7:27 AM

## 2020-08-26 NOTE — Progress Notes (Signed)
PROGRESS NOTE   Craig Flynn  STM:[id number]    DOB: Dec 18, 1960    DOA: 08/21/2020  PCP: Craig Borg, MD   I have briefly reviewed patients previous medical records in [facility].  Chief Complaint  Patient presents with  . Abdominal Pain  . Emesis  . Nausea  . Hernia    Brief Narrative:  60 year old male with medical history significant for and not limited to prior cholecystectomy ("10-15 years ago"), CKD 5 (with Flynn for HD access and starting HD soon), thyroid cancer/post thyroidectomy hypothyroidism, essential hypertension, GERD, gout, anemia, chronic diastolic CHF who presented to ED with 1 day history of severe periumbilical pain with associated nausea, vomiting and constipation.  Admitted for acute pancreatitis without complicating features and progression to ESRD with early uremic symptoms.  Nephrology consulted, [facility] placed by IR, plans to start HD 6/3.  VVS consulted and Flynn permanent vascular access next week.  Due to persistent pain and markedly elevated lipase, [facility] consult.  Improving.   Assessment & Flynn:  Active Problems:   Acute pancreatitis   Acute pancreatitis: Lipase 1378 on admission.  LFTs unremarkable.  CT abdomen and pelvis showed acute uncomplicated pancreatitis without fluid collection, pseudocyst or abscess.  S/p remote cholecystectomy.  TG: 162.  No history of alcohol use, recent medication changes or new OTC medication use for insect bites.  Denies prior episodes of pancreatitis.  Etiology unclear.?  Idiopathic.  Treated supportively with bowel rest, slowly advanced diet to nonfatty diet, brief IV fluids for about 48 hours and pain management.  Due to persistent pain and persistent elevated lipase, [name] was consulted.  Persistently elevated lipase likely due to ESRD and poor renal clearance.  Lipase improved after HD.  GI signed off and recommend outpatient follow-up for advanced pancreatic imaging in 6 to 8 weeks and will arrange follow-up  with Craig Flynn.  Use multiple doses of IV iron oral opioids yesterday.  Abdominal pain improved after large BM last night.  Has not used any opioids thus far today.  Improved.  New ESRD with early uremia/high anion gap metabolic acidosis: Presented with creatinine >15.  Follows with Dr. Johnney Flynn, nephrology with plans for HD port placement sometime this week and initiation of HD next week as OP.  Nephrology consulted, [facility] placed by IR, started HD 6/3.  Ongoing HD per nephrology-today will be his third HD.  VVS consulted and Flynn for staged left basilic fistula on 6/7  Hypercalcemia: Unclear etiology.  Presented with serum calcium of 12.6 which has come down to 11.7 post hydration.  Could be related to dehydration versus other etiologies.  Almost resolved with IV fluids.  Avoid Tums which patient reported taking lots of for acid reflux.  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  Resolved.  Chronic constipation: Aggressive bowel regimen initiated.  Had a large BM 6/5.  Essential hypertension: Had soft blood pressures earlier on in admission, now blood pressures trending up.  Monitor closely and consider resuming antihypertensives.  Anemia in chronic kidney disease: Hemoglobin stable in the 9-10 range.  Nephrology planning Aranesp with HD 6/6.  Hemoglobin gradually drifting down, 8.2 today in the absence of overt bleeding.  Follow CBC in a.m.  Transfuse if hemoglobin 7 g or less.  Post thyroidectomy hypothyroidism: Continue levothyroxine.  GERD: PPI  Gout: No  acute flare.  Continue allopurinol.  Hypoglycemia: Not a diabetic.  Not on oral hypoglycemics.  Having intermittent episodes of hypoglycemia.  Per report, does not eat his meals regularly i.e. skips meals.  Counseled regarding importance of eating all meals and  can snack in between to avoid hypoglycemic episodes.  Likely related to ESRD.  Continue to monitor CBGs.  Body mass index is 32.92 kg/m.    DVT prophylaxis: heparin injection 5,000 Units Start: 08/22/20 2200 SCDs Start: 08/22/20 0444     Code Status: Full Code Family Communication: None at bedside. Disposition:  Status is: Inpatient  Remains inpatient appropriate because:Inpatient level of care appropriate due to severity of illness   Dispo:  Patient From: Home  Planned Disposition: Home  Medically stable for discharge: No          Consultants:   Nephrology Interventional radiology  Procedures:   TDC placed by IR Hemodialysis  Antimicrobials:    Anti-infectives (From admission, onward)   Start     Dose/Rate Route Frequency Ordered Stop   08/22/20 1300  ceFAZolin (ANCEF) IVPB 2g/100 mL premix        2 g 200 mL/hr over 30 Minutes Intravenous  Once 08/22/20 1251 08/22/20 1458        Subjective:  Had a large BM last night and abdominal pain significantly improved.  RN notified hypoglycemic episodes this morning and again this afternoon.  (51, 48, 57)  Objective:   Vitals:   08/26/20 1324 08/26/20 1400 08/26/20 1409 08/26/20 1448  BP: 105/75 103/69 118/66 139/72  Pulse: 84 86 85 71  Resp:    16  Temp:   (!) 97.5 F (36.4 C) 97.8 F (36.6 C)  TempSrc:   Oral Oral  SpO2:    (!) 88%  Weight:      Height:        General exam: Young male, moderately built and nourished, lying comfortably in bed Respiratory system: Clear to auscultation.  No increased work of breathing. Cardiovascular system: S1 and S2 heard, RRR.  No JVD, murmurs or pedal edema. Gastrointestinal system: Abdomen is nondistended, soft and nontender.  Normal bowel sounds heard Central nervous system: Alert and oriented. No focal neurological deficits. Extremities: Symmetric 5 x 5 power. Skin: Small area of subcutaneous nodularity on either side of right lower ankle, likely  calciphylaxis. Psychiatry: Judgement and insight appear normal. Mood & affect appropriate.     Data Reviewed:   I have personally reviewed following labs and imaging studies   CBC: Recent Labs  Lab 08/21/20 2241 08/22/20 0506 08/23/20 1906 08/24/20 0054 08/26/20 1030  WBC 8.9   < > 8.8 7.4 6.1  NEUTROABS 8.0*  --   --   --   --   HGB 10.8*   < > 10.3* 9.4* 8.2*  HCT 32.2*   < > 30.6* 28.7* 25.7*  MCV 81.9   < > 81.2 82.0 85.4  PLT 256   < > 175 167 167   < > = values in this interval not displayed.    Basic Metabolic Panel: Recent Labs  Lab 08/22/20 0506 08/22/20 0621 08/23/20 0054 08/24/20 0054 08/26/20 1030  NA 129*   < > 131* 133* 133*  K 3.8   < > 3.7 3.2* 3.8  CL 94*  --  93* 95* 97*  CO2 20*  --  22 25 27   GLUCOSE 109*  --  83 67* 99  BUN 154*  --  156* 107* 73*  CREATININE 15.48*  --  15.53* 11.09* 8.79*  CALCIUM 11.7*  --  10.6* 9.8 9.2  MG 2.0  --   --   --   --   PHOS 7.1*  --   --   --   --    < > = values in this interval not displayed.    Liver Function Tests: Recent Labs  Lab 08/23/20 0054 08/24/20 0054 08/26/20 1030  AST 16 20 17   ALT 15 11 10   ALKPHOS 28* 33* 29*  BILITOT 0.6 0.6 0.5  PROT 6.1* 6.2* 5.8*  ALBUMIN 3.5 3.3* 3.0*    CBG: Recent Labs  Lab 08/26/20 0758 08/26/20 0829 08/26/20 1501  GLUCAP 48* 115* 57*    Microbiology Studies:   Recent Results (from the past 240 hour(s))  Resp Panel by RT-PCR (Flu A&B, Covid) Nasopharyngeal Swab     Status: None   Collection Time: 08/22/20  2:55 AM   Specimen: Nasopharyngeal Swab; Nasopharyngeal(NP) swabs in vial transport medium  Result Value Ref Range Status   SARS Coronavirus 2 by RT PCR NEGATIVE NEGATIVE Final    Comment: (NOTE) SARS-CoV-2 target nucleic acids are NOT DETECTED.  The SARS-CoV-2 RNA is generally detectable in upper respiratory specimens during the acute phase of infection. The lowest concentration of SARS-CoV-2 viral copies this assay can detect is 138  copies/mL. A negative result does not preclude SARS-Cov-2 infection and should not be used as the sole basis for treatment or other patient management decisions. A negative result may occur with  improper specimen collection/handling, submission of specimen other than nasopharyngeal swab, presence of viral mutation(s) within the areas targeted by this assay, and inadequate number of viral copies(<138 copies/mL). A negative result must be combined with clinical observations, patient history, and epidemiological information. The expected result is Negative.  Fact Sheet for Patients:  EntrepreneurPulse.com.au  Fact Sheet for Healthcare Providers:  IncredibleEmployment.be  This test is no t yet approved or cleared by the [place] FDA and  has been authorized for detection and/or diagnosis of SARS-CoV-2 by FDA under an Emergency Use Authorization (EUA). This EUA will remain  in effect (meaning this test can be used) for the duration of the COVID-19 declaration under Section 564(b)(1) of the Act, 21 U.S.C.section 360bbb-3(b)(1), unless the authorization is terminated  or revoked sooner.       Influenza A by PCR NEGATIVE NEGATIVE Final   Influenza B by PCR NEGATIVE NEGATIVE Final    Comment: (NOTE) The Xpert Xpress SARS-CoV-2/FLU/RSV plus assay is intended as an aid in the diagnosis of influenza from Nasopharyngeal swab specimens and should not be used as a sole basis for treatment. Nasal washings and aspirates are unacceptable for Xpert Xpress SARS-CoV-2/FLU/RSV testing.  Fact Sheet for Patients: EntrepreneurPulse.com.au  Fact Sheet for Healthcare Providers: IncredibleEmployment.be  This test is not yet approved or cleared by the [place] FDA and has been authorized for detection and/or diagnosis of SARS-CoV-2 by FDA under an Emergency Use Authorization (EUA). This EUA will remain in effect (meaning  this test can be used) for the duration of the COVID-19 declaration under Section 564(b)(1) of the Act, 21 U.S.C. section 360bbb-3(b)(1), unless the authorization is terminated or revoked.  Performed at [facility], [place] [address] [place], [place] [address]      Radiology Studies:  No results found.   Scheduled Meds:   . Darbepoetin Alfa      . allopurinol  100 mg Oral Daily  . Chlorhexidine Gluconate Cloth  6 each Topical Q0600  .  darbepoetin (ARANESP) injection - DIALYSIS  60 mcg Intravenous Q Mon-HD  . heparin  5,000 Units Subcutaneous Q8H  . levothyroxine  137 mcg Oral Q0600  . polyethylene glycol  17 g Oral BID  . senna-docusate  2 tablet Oral BID    Continuous Infusions:      LOS: 4 days     Vernell Leep, MD, [place], [facility]. [facility]    To contact the attending provider between 7A-7P or the covering provider during after hours 7P-7A, please log into the web site [email or web address] and access using universal [place] password for that web site. If you do not have the password, please call the hospital operator.  08/26/2020, 3:04 PM

## 2020-08-26 NOTE — Care Management Important Message (Signed)
Important Message  Patient Details  Name: Craig Flynn MRN: [id number] Date of Birth: 07-28-1960   Medicare Important Message Given:  Yes     [name] [address] 08/26/2020, 4:42 PM

## 2020-08-26 NOTE — Procedures (Signed)
I was present at this dialysis session. I have reviewed the session itself and made appropriate changes.   Vital signs in last 24 hours:  Temp:  [98 F (36.7 C)-98.5 F (36.9 C)] 98.3 F (36.8 C) (06/06 1051) Pulse Rate:  [51-80] 79 (06/06 1200) Resp:  [18] 18 (06/06 1051) BP: (109-156)/(59-80) 116/69 (06/06 1200) SpO2:  [95 %-100 %] 97 % (06/06 1051) Weight:  [116.3 kg] 116.3 kg (06/06 1051) Weight change:  Filed Weights   08/23/20 1540 08/23/20 1821 08/26/20 1051  Weight: 115.8 kg 115 kg 116.3 kg    Recent Labs  Lab 08/22/20 0506 08/22/20 0621 08/26/20 1030  NA 129*   < > 133*  K 3.8   < > 3.8  CL 94*   < > 97*  CO2 20*   < > 27  GLUCOSE 109*   < > 99  BUN 154*   < > 73*  CREATININE 15.48*   < > 8.79*  CALCIUM 11.7*   < > 9.2  PHOS 7.1*  --   --    < > = values in this interval not displayed.    Recent Labs  Lab 08/21/20 2241 08/22/20 0506 08/23/20 1906 08/24/20 0054 08/26/20 1030  WBC 8.9   < > 8.8 7.4 6.1  NEUTROABS 8.0*  --   --   --   --   HGB 10.8*   < > 10.3* 9.4* 8.2*  HCT 32.2*   < > 30.6* 28.7* 25.7*  MCV 81.9   < > 81.2 82.0 85.4  PLT 256   < > 175 167 167   < > = values in this interval not displayed.    Scheduled Meds: . allopurinol  100 mg Oral Daily  . Chlorhexidine Gluconate Cloth  6 each Topical Q0600  . darbepoetin (ARANESP) injection - DIALYSIS  60 mcg Intravenous Q Mon-HD  . heparin  5,000 Units Subcutaneous Q8H  . heparin sodium (porcine)      . levothyroxine  137 mcg Oral Q0600  . polyethylene glycol  17 g Oral BID  . senna-docusate  2 tablet Oral BID   Continuous Infusions: PRN Meds:.acetaminophen, bisacodyl, HYDROmorphone (DILAUDID) injection, melatonin, ondansetron (ZOFRAN) IV, oxyCODONE    Assessment/ Plan:   1. Acute pancreatitis: improving symptomatically with bowel rest and now started on soft nonfatty diet.  Appears to have clinical improvement along with improvement of lipase levels. 2. End-stage renal disease: With  underlying progressive chronic kidney disease and now development of assorted uremic symptoms prompting initiation of dialysis-had  2nd dialysis treatment 08/23/20 and currently on his 44rd today.  He underwent  andplacement of TDC by IR on 6/2 and plans noted by vascular surgery for first stage left brachiobasilic fistula/AVG on 11/25/15. 3. Anemia of chronic disease: Hemoglobin and hematocrit trending down, will give Aranesp with hemodialysis tomorrow. 4. Chronic kidney disease/metabolic bone disease: He was hypercalcemic as an outpatient and calcium level now trending down.  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 5. Hypertension: Monitor with ongoing pain management and hemodialysis/ultrafiltration. 6.  Hypokalemia: Ordered for a single dose of potassium this morning.   Donetta Potts,  MD 08/26/2020, 1:14 PM

## 2020-08-26 NOTE — Progress Notes (Signed)
  Progress Note    08/26/2020 7:27 AM * No surgery date entered *  Subjective:  No complaints.  All questions answered regarding dialysis access surgery   Vitals:   08/25/20 2015 08/26/20 0459  BP: 123/65 (!) 113/59  Pulse: 77 79  Resp: 18 18  Temp: 98.3 F (36.8 C) 98.2 F (36.8 C)  SpO2: 95% 96%   Physical Exam: Lungs:  Non labored Extremities:  Symmetrical radial pulses Neurologic: A&O  CBC    Component Value Date/Time   WBC 7.4 08/24/2020 0054   RBC 3.50 (L) 08/24/2020 0054   HGB 9.4 (L) 08/24/2020 0054   HCT 28.7 (L) 08/24/2020 0054   PLT 167 08/24/2020 0054   MCV 82.0 08/24/2020 0054   MCH 26.9 08/24/2020 0054   MCHC 32.8 08/24/2020 0054   RDW 12.5 08/24/2020 0054   LYMPHSABS 0.3 (L) 08/21/2020 2241   MONOABS 0.4 08/21/2020 2241   EOSABS 0.1 08/21/2020 2241   BASOSABS 0.0 08/21/2020 2241    BMET    Component Value Date/Time   NA 133 (L) 08/24/2020 0054   K 3.2 (L) 08/24/2020 0054   CL 95 (L) 08/24/2020 0054   CO2 25 08/24/2020 0054   GLUCOSE 67 (L) 08/24/2020 0054   GLUCOSE 130 (H) 03/03/2006 0910   BUN 107 (H) 08/24/2020 0054   CREATININE 11.09 (H) 08/24/2020 0054   CREATININE 4.09 (H) 09/12/2015 1529   CALCIUM 9.8 08/24/2020 0054   GFRNONAA 5 (L) 08/24/2020 0054   GFRAA 11 (L) 11/06/2019 0014    INR No results found for: INR   Intake/Output Summary (Last 24 hours) at 08/26/2020 0727 Last data filed at 08/25/2020 1330 Gross per 24 hour  Intake 300 ml  Output --  Net 300 ml     Assessment/Plan:  60 y.o. male with ESRD on HD  Plan is for L arm fistula creation versus graft placement tomorrow 08/27/20 with Dr. Oneida Alar Risks of surgery discussed and all questions answered; patient is willing to proceed NPO past midnight Consent   Dagoberto Ligas, PA-C [facility] [phone] 08/26/2020 7:27 AM

## 2020-08-27 ENCOUNTER — Inpatient Hospital Stay (HOSPITAL_COMMUNITY): Payer: Medicare HMO

## 2020-08-27 ENCOUNTER — Encounter (HOSPITAL_COMMUNITY)
Admission: EM | Disposition: A | Discharge status: Home / Self Care | Payer: Self-pay | Source: Home / Self Care | Attending: Internal Medicine

## 2020-08-27 ENCOUNTER — Encounter (HOSPITAL_COMMUNITY): Payer: Self-pay | Admitting: Internal Medicine

## 2020-08-27 DIAGNOSIS — N186 End stage renal disease: Secondary | ICD-10-CM

## 2020-08-27 HISTORY — PX: AV FISTULA PLACEMENT: SHX1204

## 2020-08-27 LAB — GLUCOSE, CAPILLARY
Glucose-Capillary: 76 mg/dL (ref 70–99)
Glucose-Capillary: 79 mg/dL (ref 70–99)
Glucose-Capillary: 80 mg/dL (ref 70–99)
Glucose-Capillary: 54 mg/dL — ABNORMAL LOW (ref 70–99)
Glucose-Capillary: 83 mg/dL (ref 70–99)
Glucose-Capillary: 75 mg/dL (ref 70–99)
Glucose-Capillary: 97 mg/dL (ref 70–99)

## 2020-08-27 LAB — CBC
HCT: 25.7 % — ABNORMAL LOW (ref 39.0–52.0)
Hemoglobin: 8.2 g/dL — ABNORMAL LOW (ref 13.0–17.0)
MCH: 27.5 pg (ref 26.0–34.0)
MCHC: 31.9 g/dL (ref 30.0–36.0)
MCV: 86.2 fL (ref 80.0–100.0)
Platelets: 168 10*3/uL (ref 150–400)
RBC: 2.98 MIL/uL — ABNORMAL LOW (ref 4.22–5.81)
RDW: 12.8 % (ref 11.5–15.5)
WBC: 6.2 10*3/uL (ref 4.0–10.5)
nRBC: 0 % (ref 0.0–0.2)

## 2020-08-27 LAB — IRON AND TIBC
Iron: 41 ug/dL — ABNORMAL LOW (ref 45–182)
Saturation Ratios: 13 % — ABNORMAL LOW (ref 17.9–39.5)
TIBC: 316 ug/dL (ref 250–450)
UIBC: 275 ug/dL

## 2020-08-27 LAB — FERRITIN: Ferritin: 550 ng/mL — ABNORMAL HIGH (ref 24–336)

## 2020-08-27 SURGERY — ARTERIOVENOUS (AV) FISTULA CREATION
Anesthesia: Choice | Site: Arm Upper | Laterality: Left

## 2020-08-27 MED ORDER — FENTANYL CITRATE (PF) 250 MCG/5ML IJ SOLN
INTRAMUSCULAR | Status: DC | PRN
  Administered 2020-08-27 (×2): 50 ug via INTRAVENOUS

## 2020-08-27 MED ORDER — MIDAZOLAM HCL 2 MG/2ML IJ SOLN
INTRAMUSCULAR | Status: AC
  Filled 2020-08-27: qty 2

## 2020-08-27 MED ORDER — KIDNEY FAILURE BOOK: Freq: Once | Status: AC

## 2020-08-27 MED ORDER — FENTANYL CITRATE (PF) 250 MCG/5ML IJ SOLN
INTRAMUSCULAR | Status: AC
  Filled 2020-08-27: qty 5

## 2020-08-27 MED ORDER — FENTANYL CITRATE (PF) 100 MCG/2ML IJ SOLN
INTRAMUSCULAR | Status: AC
  Filled 2020-08-27: qty 2

## 2020-08-27 MED ORDER — LIDOCAINE 2% (20 MG/ML) 5 ML SYRINGE
INTRAMUSCULAR | Status: DC | PRN
  Administered 2020-08-27: 60 mg via INTRAVENOUS
  Administered 2020-08-27: 40 mg via INTRAVENOUS

## 2020-08-27 MED ORDER — PROPOFOL 10 MG/ML IV BOLUS
INTRAVENOUS | Status: DC | PRN
  Administered 2020-08-27 (×2): 25 mg via INTRAVENOUS

## 2020-08-27 MED ORDER — SODIUM CHLORIDE 0.9 % IV SOLN
INTRAVENOUS | Status: AC
  Filled 2020-08-27: qty 1.2

## 2020-08-27 MED ORDER — FENTANYL CITRATE (PF) 100 MCG/2ML IJ SOLN
25.0000 ug | INTRAMUSCULAR | Status: DC | PRN
  Administered 2020-08-27: 50 ug via INTRAVENOUS

## 2020-08-27 MED ORDER — LIDOCAINE-EPINEPHRINE (PF) 1 %-1:200000 IJ SOLN
INTRAMUSCULAR | Status: DC | PRN
  Administered 2020-08-27: 7 mL

## 2020-08-27 MED ORDER — OXYCODONE HCL 5 MG PO TABS
5.0000 mg | ORAL_TABLET | Freq: Once | ORAL | Status: AC | PRN
  Administered 2020-08-27: 5 mg via ORAL

## 2020-08-27 MED ORDER — PROPOFOL 500 MG/50ML IV EMUL
INTRAVENOUS | Status: DC | PRN
  Administered 2020-08-27: 75 ug/kg/min via INTRAVENOUS

## 2020-08-27 MED ORDER — CHLORHEXIDINE GLUCONATE 0.12 % MT SOLN: 15.0000 mL | Freq: Once | OROMUCOSAL | Status: AC

## 2020-08-27 MED ORDER — PHENYLEPHRINE 40 MCG/ML (10ML) SYRINGE FOR IV PUSH (FOR BLOOD PRESSURE SUPPORT)
PREFILLED_SYRINGE | INTRAVENOUS | Status: AC
  Filled 2020-08-27: qty 10

## 2020-08-27 MED ORDER — CEFAZOLIN SODIUM-DEXTROSE 2-3 GM-%(50ML) IV SOLR
INTRAVENOUS | Status: DC | PRN
  Administered 2020-08-27: 2 g via INTRAVENOUS

## 2020-08-27 MED ORDER — SODIUM CHLORIDE 0.9 % IV SOLN: INTRAVENOUS | Status: DC

## 2020-08-27 MED ORDER — OXYCODONE HCL 5 MG/5ML PO SOLN
5.0000 mg | Freq: Once | ORAL | Status: AC | PRN
Start: 2020-08-27 — End: 2020-08-27

## 2020-08-27 MED ORDER — GLUCOSE 4 G PO CHEW: 1.0000 | CHEWABLE_TABLET | ORAL | Status: DC | PRN

## 2020-08-27 MED ORDER — PHENYLEPHRINE HCL-NACL 10-0.9 MG/250ML-% IV SOLN
INTRAVENOUS | Status: DC | PRN
  Administered 2020-08-27: 40 ug/min via INTRAVENOUS

## 2020-08-27 MED ORDER — CHLORHEXIDINE GLUCONATE 0.12 % MT SOLN
OROMUCOSAL | Status: AC
  Administered 2020-08-27: 15 mL via OROMUCOSAL
  Filled 2020-08-27: qty 15

## 2020-08-27 MED ORDER — MIDAZOLAM HCL 5 MG/5ML IJ SOLN
INTRAMUSCULAR | Status: DC | PRN
  Administered 2020-08-27: 2 mg via INTRAVENOUS

## 2020-08-27 MED ORDER — CEFAZOLIN SODIUM-DEXTROSE 2-4 GM/100ML-% IV SOLN
INTRAVENOUS | Status: AC
  Filled 2020-08-27: qty 100

## 2020-08-27 MED ORDER — 0.9 % SODIUM CHLORIDE (POUR BTL) OPTIME
TOPICAL | Status: DC | PRN
  Administered 2020-08-27: 1000 mL

## 2020-08-27 MED ORDER — ORAL CARE MOUTH RINSE: 15.0000 mL | Freq: Once | OROMUCOSAL | Status: AC

## 2020-08-27 MED ORDER — LIDOCAINE HCL (PF) 0.5 % IJ SOLN
INTRAMUSCULAR | Status: AC
  Filled 2020-08-27: qty 50

## 2020-08-27 MED ORDER — OXYCODONE HCL 5 MG PO TABS
ORAL_TABLET | ORAL | Status: AC
  Filled 2020-08-27: qty 1

## 2020-08-27 MED ORDER — OXYCODONE HCL 5 MG PO TABS: 5.0000 mg | ORAL_TABLET | ORAL | Status: DC | PRN

## 2020-08-27 MED ORDER — LIDOCAINE-EPINEPHRINE (PF) 1 %-1:200000 IJ SOLN
INTRAMUSCULAR | Status: AC
  Filled 2020-08-27: qty 30

## 2020-08-27 MED ORDER — PHENYLEPHRINE 40 MCG/ML (10ML) SYRINGE FOR IV PUSH (FOR BLOOD PRESSURE SUPPORT)
PREFILLED_SYRINGE | INTRAVENOUS | Status: DC | PRN
  Administered 2020-08-27: 120 ug via INTRAVENOUS

## 2020-08-27 MED ORDER — ONDANSETRON HCL 4 MG/2ML IJ SOLN: 4.0000 mg | Freq: Once | INTRAMUSCULAR | Status: DC | PRN

## 2020-08-27 MED ORDER — PROPOFOL 10 MG/ML IV BOLUS
INTRAVENOUS | Status: AC
  Filled 2020-08-27: qty 20

## 2020-08-27 MED ORDER — SODIUM CHLORIDE 0.9 % IV SOLN
INTRAVENOUS | Status: DC | PRN
  Administered 2020-08-27: 500 mL

## 2020-08-27 SURGICAL SUPPLY — 30 items
ADH SKN CLS APL DERMABOND .7 (GAUZE/BANDAGES/DRESSINGS) ×1
ARMBAND PINK RESTRICT EXTREMIT (MISCELLANEOUS) ×6 IMPLANT
CANISTER SUCT 3000ML PPV (MISCELLANEOUS) ×3 IMPLANT
CANNULA VESSEL 3MM 2 BLNT TIP (CANNULA) ×3 IMPLANT
CLIP LIGATING EXTRA MED SLVR (CLIP) ×3 IMPLANT
CLIP LIGATING EXTRA SM BLUE (MISCELLANEOUS) ×3 IMPLANT
COVER PROBE W GEL 5X96 (DRAPES) ×3 IMPLANT
COVER WAND RF STERILE (DRAPES) ×1 IMPLANT
DECANTER SPIKE VIAL GLASS SM (MISCELLANEOUS) ×3 IMPLANT
DERMABOND ADVANCED (GAUZE/BANDAGES/DRESSINGS) ×2
DERMABOND ADVANCED .7 DNX12 (GAUZE/BANDAGES/DRESSINGS) ×1 IMPLANT
ELECT REM PT RETURN 9FT ADLT (ELECTROSURGICAL) ×3
ELECTRODE REM PT RTRN 9FT ADLT (ELECTROSURGICAL) ×1 IMPLANT
GLOVE SS BIOGEL STRL SZ 7.5 (GLOVE) ×1 IMPLANT
GLOVE SUPERSENSE BIOGEL SZ 7.5 (GLOVE) ×2
GOWN STRL REUS W/ TWL LRG LVL3 (GOWN DISPOSABLE) ×3 IMPLANT
GOWN STRL REUS W/TWL LRG LVL3 (GOWN DISPOSABLE) ×9
KIT BASIN OR (CUSTOM PROCEDURE TRAY) ×3 IMPLANT
KIT TURNOVER KIT B (KITS) ×3 IMPLANT
NS IRRIG 1000ML POUR BTL (IV SOLUTION) ×3 IMPLANT
PACK CV ACCESS (CUSTOM PROCEDURE TRAY) ×3 IMPLANT
PAD ARMBOARD 7.5X6 YLW CONV (MISCELLANEOUS) ×6 IMPLANT
SLEEVE SURGEON STRL (DRAPES) ×2 IMPLANT
SUT PROLENE 6 0 CC (SUTURE) ×3 IMPLANT
SUT VIC AB 3-0 SH 27 (SUTURE) ×3
SUT VIC AB 3-0 SH 27X BRD (SUTURE) ×1 IMPLANT
SUT VICRYL 4-0 PS2 18IN ABS (SUTURE) ×2 IMPLANT
TOWEL GREEN STERILE (TOWEL DISPOSABLE) ×3 IMPLANT
UNDERPAD 30X36 HEAVY ABSORB (UNDERPADS AND DIAPERS) ×3 IMPLANT
WATER STERILE IRR 1000ML POUR (IV SOLUTION) ×3 IMPLANT

## 2020-08-27 NOTE — Progress Notes (Signed)
Rounded on patient today in correlation to transition to outpatient HD. Patient found to be lying in the bed and granted permission for the conversation. Patient reports being well versed in HD treatments as he has had multiple family members on HD. He also reports that he is on the transplant list and plan to follow with Dr. Elmarie Shiley outpatient. Ordered Kidney Failure book. Patient educated at the bedside regarding care of tunneled dialysis catheter, AV fistula/graft site care (still pending which type will be placed), assessment of thrill daily and proper medication administration on HD days.  Patient also educated on the importance of adhering to scheduled dialysis treatments, the effects of fluid overload, hyperkalemia and hyperphosphatemia. Patient capable of re-verbalizing via teach back method. Patient with no further questions at this time. Handouts and contact information provided to patient for any further assistance. Will follow as appropriate.   Dorthey Sawyer, RN  Dialysis Nurse Coordinator Phone: [phone]

## 2020-08-27 NOTE — Discharge Instructions (Signed)
° °  [facility] [place] ° °Discharge Instructions ° °AV Fistula or Graft Surgery for Dialysis Access ° °Please refer to the following instructions for your post-procedure care. Your surgeon or physician assistant will discuss any changes with you. ° °Activity ° °You may drive the day following your surgery, if you are comfortable and no longer taking prescription pain medication. Resume full activity as the soreness in your incision resolves. ° °Bathing/Showering ° °You may shower after you go home. Keep your incision dry for 48 hours. Do not soak in a bathtub, hot tub, or swim until the incision heals completely. You may not shower if you have a hemodialysis catheter. ° °Incision Care ° °Clean your incision with mild soap and water after 48 hours. Pat the area dry with a clean towel. You do not need a bandage unless otherwise instructed. Do not apply any ointments or creams to your incision. You may have skin glue on your incision. Do not peel it off. It will come off on its own in about one week. Your arm may swell a bit after surgery. To reduce swelling use pillows to elevate your arm so it is above your heart. Your doctor will tell you if you need to lightly wrap your arm with an ACE bandage. ° °Diet ° °Resume your normal diet. There are not special food restrictions following this procedure. In order to heal from your surgery, it is CRITICAL to get adequate nutrition. Your body requires vitamins, minerals, and protein. Vegetables are the best source of vitamins and minerals. Vegetables also provide the perfect balance of protein. Processed food has little nutritional value, so try to avoid this. ° °Medications ° °Resume taking all of your medications. If your incision is causing pain, you may take over-the counter pain relievers such as acetaminophen (Tylenol). If you were prescribed a stronger pain medication, please be aware these medications can cause nausea and constipation. Prevent  nausea by taking the medication with a snack or meal. Avoid constipation by drinking plenty of fluids and eating foods with high amount of fiber, such as fruits, vegetables, and grains. Do not take Tylenol if you are taking prescription pain medications. ° ° ° ° °Follow up °Your surgeon may want to see you in the office following your access surgery. If so, this will be arranged at the time of your surgery. ° °Please call us immediately for any of the following conditions: ° °Increased pain, redness, drainage (pus) from your incision site °Fever of 101 degrees or higher °Severe or worsening pain at your incision site °Hand pain or numbness. ° °Reduce your risk of vascular disease: ° °Stop smoking. If you would like help, call [facility] at 1-800-QUIT-NOW (1-800-784-8669) or [facility] at 336-586-4000 ° °Manage your cholesterol °Maintain a desired weight °Control your diabetes °Keep your blood pressure down ° °Dialysis ° °It will take several weeks to several months for your new dialysis access to be ready for use. Your surgeon will determine when it is OK to use it. Your nephrologist will continue to direct your dialysis. You can continue to use your Permcath until your new access is ready for use. ° °If you have any questions, please call the office at [phone]. ° °

## 2020-08-27 NOTE — Op Note (Signed)
    OPERATIVE REPORT  DATE OF SURGERY: 08/27/2020  PATIENT: Craig Flynn, 60 y.o. male MRN: [id number]  DOB: 1960-09-19  PRE-OPERATIVE DIAGNOSIS: End-stage renal disease  POST-OPERATIVE DIAGNOSIS:  Same  PROCEDURE: Left first stage brachiobasilic fistula  SURGEON:  Curt Jews, M.D.  PHYSICIAN ASSISTANT: Arlee Muslim, PA-C  The assistant was needed for exposure and to expedite the case  ANESTHESIA: Local with sedation  EBL: per anesthesia record  Total I/O In: 73 [IV Piggyback:50] Out: -   BLOOD ADMINISTERED: none  DRAINS: none  SPECIMEN: none  COUNTS CORRECT:  YES  PATIENT DISPOSITION:  PACU - hemodynamically stable  PROCEDURE DETAILS: Patient has had a prior left brachiocephalic fistula which did not mature.  This thrombosed.  Patient now is in end-stage renal disease catheter placed yesterday with interventional radiology.  Dissected open this time for left arm Siliq vein fistula.  The fashion.  SonoSite ultrasound was used to visualize the vein.  Patient very nice basilic vein above the elbow.  I did branch just above the elbow with 2 large branches.  The vein joining the deep system and abductor origin.  Incision was made through the level of the brachial artery and the basilic vein at the level of antecubital space.  The vein was very nice above the branch point.  Both branches were mobilized further distally to the level of the antecubital space.  Both of these were ligated distally and divided.  Both were gently dilated and the better of the 2 was in the more medial branch.  The other branch was ligated.  The brachial artery was exposed to the same incision.  The artery was encircled proximal distal to the old cephalic vein anastomosis.  The artery was occluded proximally distally and the cephalic vein anastomosis was taken down.  The basilic vein was cut to the appropriate length and spatulated and sewn end-to-side to the artery with a running 6-0 Prolene suture.   Clamps removed and excellent thrill was noted.  Wounds irrigated with saline.  Hemostasis electrocautery.  Wounds were closed with 3-0 Vicryl in the subcutaneous subcuticular tissue.  The patient maintained a left radial pulse.  He was transferred to the recovery room in stable condition   Craig Flynn, M.D., [facility] 08/27/2020 11:13 AM  Note: Portions of this report may have been transcribed using voice recognition software.  Every effort has been made to ensure accuracy; however, inadvertent computerized transcription errors may still be present.

## 2020-08-27 NOTE — Interval H&P Note (Signed)
History and Physical Interval Note:  08/27/2020 9:21 AM  Craig Flynn  has presented today for surgery, with the diagnosis of CKD V.  The various methods of treatment have been discussed with the patient and family. After consideration of risks, benefits and other options for treatment, the patient has consented to  Procedure(s): LEFT BASILIC VEIN ARTERIOVENOUS (AV) FISTULA CREATION VERSUS GRAFT PLACEMENT (Left) as a surgical intervention.  The patient's history has been reviewed, patient examined, no change in status, stable for surgery.  I have reviewed the patient's chart and labs.  Questions were answered to the patient's satisfaction.     Curt Jews

## 2020-08-27 NOTE — Progress Notes (Signed)
Patient ID: Craig Flynn, male   DOB: July 07, 1960, 60 y.o.   MRN: [id number] S: No complaints and feels well. O:BP (!) 140/51   Pulse 69   Temp (!) 97.5 F (36.4 C) (Oral)   Resp 14   Ht 6\' 2"  (1.88 m)   Wt 114.8 kg   SpO2 100%   BMI 32.49 kg/m   Intake/Output Summary (Last 24 hours) at 08/27/2020 1536 Last data filed at 08/27/2020 1335 Gross per 24 hour  Intake 820 ml  Output 210 ml  Net 610 ml   Intake/Output: I/O last 3 completed shifts: In: 550 [P.O.:550] Out: 1510 [Other:1510]  Intake/Output this shift:  Total I/O In: 490 [P.O.:240; I.V.:200; IV Piggyback:50] Out: 210 [Urine:200; Blood:10] Weight change:  Gen: NAD CVS: RRR Resp: CTA Abd: +BS, soft, NT/Nd Ext: no edema, LUE AVF +T/B  Recent Labs  Lab 08/21/20 2241 08/22/20 0506 08/22/20 0621 08/23/20 0054 08/24/20 0054 08/26/20 1030  NA 128* 129* 131* 131* 133* 133*  K 3.5 3.8 3.8 3.7 3.2* 3.8  CL 84* 94*  --  93* 95* 97*  CO2 19* 20*  --  22 25 27   GLUCOSE 130* 109*  --  83 67* 99  BUN 153* 154*  --  156* 107* 73*  CREATININE 15.56* 15.48*  --  15.53* 11.09* 8.79*  ALBUMIN 4.4 3.9  --  3.5 3.3* 3.0*  CALCIUM 12.6* 11.7*  --  10.6* 9.8 9.2  PHOS  --  7.1*  --   --   --   --   AST 19 18  --  16 20 17   ALT 19 15  --  15 11 10    Liver Function Tests: Recent Labs  Lab 08/23/20 0054 08/24/20 0054 08/26/20 1030  AST 16 20 17   ALT 15 11 10   ALKPHOS 28* 33* 29*  BILITOT 0.6 0.6 0.5  PROT 6.1* 6.2* 5.8*  ALBUMIN 3.5 3.3* 3.0*   Recent Labs  Lab 08/22/20 0506 08/23/20 0054 08/24/20 0054  LIPASE 1,378* 1,294* 332*   No results for input(s): AMMONIA in the last 168 hours. CBC: Recent Labs  Lab 08/21/20 2241 08/22/20 0506 08/23/20 0054 08/23/20 1906 08/24/20 0054 08/26/20 1030 08/27/20 0043  WBC 8.9   < > 8.8 8.8 7.4 6.1 6.2  NEUTROABS 8.0*  --   --   --   --   --   --   HGB 10.8*   < > 9.3* 10.3* 9.4* 8.2* 8.2*  HCT 32.2*   < > 28.4* 30.6* 28.7* 25.7* 25.7*  MCV 81.9   < > 81.6 81.2 82.0  85.4 86.2  PLT 256   < > 165 175 167 167 168   < > = values in this interval not displayed.   Cardiac Enzymes: No results for input(s): CKTOTAL, CKMB, CKMBINDEX, TROPONINI in the last 168 hours. CBG: Recent Labs  Lab 08/27/20 0612 08/27/20 0748 08/27/20 0840 08/27/20 1257 08/27/20 1442  GLUCAP 76 79 80 54* 83    Iron Studies:  Recent Labs    08/27/20 0043  IRON 41*  TIBC 316  FERRITIN 550*   Studies/Results: No results found. Marland Kitchen allopurinol  100 mg Oral Daily  . Chlorhexidine Gluconate Cloth  6 each Topical Q0600  . darbepoetin (ARANESP) injection - DIALYSIS  60 mcg Intravenous Q Mon-HD  . fentaNYL      . heparin  5,000 Units Subcutaneous Q8H  . levothyroxine  137 mcg Oral Q0600  . oxyCODONE      .  polyethylene glycol  17 g Oral BID  . senna-docusate  2 tablet Oral BID    BMET    Component Value Date/Time   NA 133 (L) 08/26/2020 1030   K 3.8 08/26/2020 1030   CL 97 (L) 08/26/2020 1030   CO2 27 08/26/2020 1030   GLUCOSE 99 08/26/2020 1030   GLUCOSE 130 (H) 03/03/2006 0910   BUN 73 (H) 08/26/2020 1030   CREATININE 8.79 (H) 08/26/2020 1030   CREATININE 4.09 (H) 09/12/2015 1529   CALCIUM 9.2 08/26/2020 1030   GFRNONAA 6 (L) 08/26/2020 1030   GFRAA 11 (L) 11/06/2019 0014   CBC    Component Value Date/Time   WBC 6.2 08/27/2020 0043   RBC 2.98 (L) 08/27/2020 0043   HGB 8.2 (L) 08/27/2020 0043   HCT 25.7 (L) 08/27/2020 0043   PLT 168 08/27/2020 0043   MCV 86.2 08/27/2020 0043   MCH 27.5 08/27/2020 0043   MCHC 31.9 08/27/2020 0043   RDW 12.8 08/27/2020 0043   LYMPHSABS 0.3 (L) 08/21/2020 2241   MONOABS 0.4 08/21/2020 2241   EOSABS 0.1 08/21/2020 2241   BASOSABS 0.0 08/21/2020 2241     Assessment/Plan:  1. Acute pancreatitis:improving symptomatically with bowel rest and now started on soft nonfatty diet. Appears to have clinical improvement along with improvement of lipase levels.  He is having some episodes of mild hypoglycemia.  Will need further  evaluation as an outpatient.  In the meantime will encourage frequent meals. 2. End-stage renal disease: With underlying progressive chronic kidney disease and now development of assorted uremic symptoms prompting initiation of dialysis-had2nddialysis treatment 08/23/20 and currently on his 29rd today. He underwentand placement of TDC by IR on 6/2 and first stage,left brachiobasilic AVF creation by Dr. Donnetta Hutching on 08/27/20. 3. Anemia of chronic disease:Hemoglobin and hematocrit trending down, will give Aranesp with hemodialysis tomorrow. 4. Chronic kidney disease/metabolic bone disease: He was hypercalcemic as an outpatient andcalcium level now trending down. 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 5. Hypertension: Monitor with ongoing pain management and hemodialysis/ultrafiltration. 6. Hypokalemia: Ordered for a single dose of potassium this morning. 7. Disposition: pt is ready for discharge once he has outpatient dialysis arrangements in place.   Donetta Potts, MD [employer] [phone]

## 2020-08-27 NOTE — Progress Notes (Addendum)
PROGRESS NOTE   Craig Flynn  EXB:[id number]    DOB: 1961/02/01    DOA: 08/21/2020  PCP: Biagio Borg, MD   I have briefly reviewed patients previous medical records in [facility].  Chief Complaint  Patient presents with  . Abdominal Pain  . Emesis  . Nausea  . Hernia    Brief Narrative:  60 year old male with medical history significant for and not limited to prior cholecystectomy ("10-15 years ago"), CKD 5 (with plan for HD access and starting HD soon), thyroid cancer/post thyroidectomy hypothyroidism, essential hypertension, GERD, gout, anemia, chronic diastolic CHF who presented to ED with 1 day history of severe periumbilical pain with associated nausea, vomiting and constipation.  Admitted for acute pancreatitis without complicating features and progression to ESRD with early uremic symptoms.  Nephrology consulted, Nelson placed by IR, and started HD 6/3.  VVS consulted and s/p left first stage brachiobasilic fistula 6/7.  Due to persistent pain and markedly elevated lipase, [facility] consulted and signed off.  Improved from pancreatitis perspective.  Renal navigator to advise regarding outpatient HD slot.   Assessment & Plan:  Active Problems:   Acute pancreatitis   Acute pancreatitis: Lipase 1378 on admission.  LFTs unremarkable.  CT abdomen and pelvis showed acute uncomplicated pancreatitis without fluid collection, pseudocyst or abscess.  S/p remote cholecystectomy.  TG: 162.  No history of alcohol use, recent medication changes or new OTC medication use for insect bites.  Denies prior episodes of pancreatitis.  Etiology unclear.?  Idiopathic.  Treated supportively with bowel rest, slowly advanced diet to nonfatty diet, brief IV fluids for about 48 hours and pain management.  Due to persistent pain and persistent elevated lipase,  GI was consulted.  Persistently elevated lipase likely due to ESRD and poor renal clearance.  Lipase improved after HD.  GI signed off and  recommend outpatient follow-up for advanced pancreatic imaging in 6 to 8 weeks and will arrange follow-up with Dr. [name].  Clinically improved.  Tolerating diet.  Only used a dose of IV and oral opioids yesterday.  New ESRD with early uremia/high anion gap metabolic acidosis: Presented with creatinine >15.  Follows with Dr. [name], Nephrology.  Nephrology consulted, [place] placed by IR, started HD 6/3.  Ongoing HD per nephrology-thus far has completed 3 cycles of HD.  VVS consulted and s/p left first stage brachiobasilic fistula 6/7.  Nephrology/renal navigator to advise regarding outpatient HD slot and potential timing of discharge.  Hypercalcemia: Unclear etiology.  Presented with serum calcium of 12.6 which has come down to 11.7 post hydration.  Could be related to dehydration versus other etiologies.  Almost resolved with IV fluids.  Avoid Tums which patient reported taking lots of for acid reflux.  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  Resolved.  Chronic constipation: Aggressive bowel regimen initiated and continue.  Had a large BM 6/5 and no BM since.  Essential hypertension: Had soft blood pressures earlier on in admission, now blood pressures trending up.  Monitor closely and consider resuming antihypertensives.  Anemia in chronic kidney disease: Hemoglobin stable in the 9-10 range.  Nephrology planning Aranesp with HD 6/6.  Hemoglobin gradually drifting down, 8.2 today in the absence of overt bleeding.  Transfuse if hemoglobin 7 g or less.  Follow repeat CBC and renal panel across next dialysis.  Post thyroidectomy hypothyroidism: Continue levothyroxine.  GERD: PPI  Gout: No acute flare.  Continue allopurinol.  Hypoglycemia: Not a diabetic.  Not on oral hypoglycemics.  Having intermittent episodes of  hypoglycemia.  Per report, does not eat his meals regularly i.e. skips meals.  Counseled regarding importance of eating all meals and can snack in between to avoid hypoglycemic episodes.  Likely related to ESRD and poor insulin clearance and could also be d/t pancreatitis. Also doesn't like hospital food.  Had another hypoglycemic episode with CBG of 54 which could be related to being n.p.o. for AV fistula.  06/07/2020: A1c was 5.5 and TSH 0.50.  I discussed with nephrology.  Encouraged frequent meals and continue management as above.  If continues to have these episodes may need further evaluation including checking insulin levels etc.. Added glucose tablets 4 g as needed for hypoglycemia.  Body mass index is 32.49 kg/m.    DVT prophylaxis: heparin injection 5,000 Units Start: 08/22/20 2200 SCDs Start: 08/22/20 0444     Code Status: Full Code Family Communication: None at bedside. Disposition:  Status is: Inpatient  Remains inpatient appropriate because:Inpatient level of care appropriate due to severity of illness   Dispo:  Patient From: Home  Planned Disposition: Home  Medically stable for discharge: No          Consultants:   Nephrology Interventional radiology  Procedures:   TDC placed by IR Hemodialysis Left first stage brachial basilic fistula creation by vascular surgery 6/7.  Antimicrobials:    Anti-infectives (From admission, onward)   Start     Dose/Rate Route Frequency Ordered Stop   08/27/20 0927  ceFAZolin (ANCEF) 2-4 GM/100ML-% IVPB       Note to Pharmacy: Nikki Dom   : cabinet override      08/27/20 0927 08/27/20 2144   08/22/20 1300  ceFAZolin (ANCEF) IVPB 2g/100 mL premix        2 g 200 mL/hr over 30 Minutes Intravenous  Once 08/22/20 1251 08/22/20 1458        Subjective:  Patient seen this morning while on his way for procedure.  Denied complaints.  No abdominal pain.  No BM since 6/5.  Objective:   Vitals:   08/27/20 1135 08/27/20  1140 08/27/20 1150 08/27/20 1214  BP: (!) 139/58  (!) 130/49 (!) 140/51  Pulse: 73 70 72 69  Resp: 19  18 14   Temp:   97.6 F (36.4 C) (!) 97.5 F (36.4 C)  TempSrc:    Oral  SpO2: 100%  100% 100%  Weight:      Height:        General exam: Young male, moderately built and nourished lying comfortably supine in bed. Respiratory system: Clear to auscultation.  No increased work of breathing. Cardiovascular system: S1 and S2 heard, RRR.  No JVD, murmurs or pedal edema. Gastrointestinal system: Abdomen is nondistended, soft and nontender.  Normal bowel sounds heard. Central nervous system: Alert and oriented. No focal neurological deficits. Extremities: Symmetric 5 x 5 power. Skin: Small area of subcutaneous nodularity on either side of right lower ankle, likely calciphylaxis. Psychiatry: Judgement and insight appear normal. Mood & affect appropriate.     Data Reviewed:   I have personally reviewed following labs and imaging studies   CBC: Recent Labs  Lab 08/21/20 2241 08/22/20 0506 08/24/20 0054 08/26/20 1030 08/27/20 0043  WBC 8.9   < > 7.4 6.1 6.2  NEUTROABS 8.0*  --   --   --   --  HGB 10.8*   < > 9.4* 8.2* 8.2*  HCT 32.2*   < > 28.7* 25.7* 25.7*  MCV 81.9   < > 82.0 85.4 86.2  PLT 256   < > 167 167 168   < > = values in this interval not displayed.    Basic Metabolic Panel: Recent Labs  Lab 08/22/20 0506 08/22/20 0621 08/23/20 0054 08/24/20 0054 08/26/20 1030  NA 129*   < > 131* 133* 133*  K 3.8   < > 3.7 3.2* 3.8  CL 94*  --  93* 95* 97*  CO2 20*  --  22 25 27   GLUCOSE 109*  --  83 67* 99  BUN 154*  --  156* 107* 73*  CREATININE 15.48*  --  15.53* 11.09* 8.79*  CALCIUM 11.7*  --  10.6* 9.8 9.2  MG 2.0  --   --   --   --   PHOS 7.1*  --   --   --   --    < > = values in this interval not displayed.    Liver Function Tests: Recent Labs  Lab 08/23/20 0054 08/24/20 0054 08/26/20 1030  AST 16 20 17   ALT 15 11 10   ALKPHOS 28* 33* 29*  BILITOT  0.6 0.6 0.5  PROT 6.1* 6.2* 5.8*  ALBUMIN 3.5 3.3* 3.0*    CBG: Recent Labs  Lab 08/27/20 0840 08/27/20 1257 08/27/20 1442  GLUCAP 80 54* 83    Microbiology Studies:   Recent Results (from the past 240 hour(s))  Resp Panel by RT-PCR (Flu A&B, Covid) Nasopharyngeal Swab     Status: None   Collection Time: 08/22/20  2:55 AM   Specimen: Nasopharyngeal Swab; Nasopharyngeal(NP) swabs in vial transport medium  Result Value Ref Range Status   SARS Coronavirus 2 by RT PCR NEGATIVE NEGATIVE Final    Comment: (NOTE) SARS-CoV-2 target nucleic acids are NOT DETECTED.  The SARS-CoV-2 RNA is generally detectable in upper respiratory specimens during the acute phase of infection. The lowest concentration of SARS-CoV-2 viral copies this assay can detect is 138 copies/mL. A negative result does not preclude SARS-Cov-2 infection and should not be used as the sole basis for treatment or other patient management decisions. A negative result may occur with  improper specimen collection/handling, submission of specimen other than nasopharyngeal swab, presence of viral mutation(s) within the areas targeted by this assay, and inadequate number of viral copies(<138 copies/mL). A negative result must be combined with clinical observations, patient history, and epidemiological information. The expected result is Negative.  Fact Sheet for Patients:  EntrepreneurPulse.com.au  Fact Sheet for Healthcare Providers:  IncredibleEmployment.be  This test is no t yet approved or cleared by the [place] FDA and  has been authorized for detection and/or diagnosis of SARS-CoV-2 by FDA under an Emergency Use Authorization (EUA). This EUA will remain  in effect (meaning this test can be used) for the duration of the COVID-19 declaration under Section 564(b)(1) of the Act, 21 U.S.C.section 360bbb-3(b)(1), unless the authorization is terminated  or revoked sooner.        Influenza A by PCR NEGATIVE NEGATIVE Final   Influenza B by PCR NEGATIVE NEGATIVE Final    Comment: (NOTE) The Xpert Xpress SARS-CoV-2/FLU/RSV plus assay is intended as an aid in the diagnosis of influenza from Nasopharyngeal swab specimens and should not be used as a sole basis for treatment. Nasal washings and aspirates are unacceptable for Xpert Xpress SARS-CoV-2/FLU/RSV testing.  Fact Sheet for Patients:  EntrepreneurPulse.com.au  Fact Sheet for Healthcare Providers: IncredibleEmployment.be  This test is not yet approved or cleared by the [place] FDA and has been authorized for detection and/or diagnosis of SARS-CoV-2 by FDA under an Emergency Use Authorization (EUA). This EUA will remain in effect (meaning this test can be used) for the duration of the COVID-19 declaration under Section 564(b)(1) of the Act, 21 U.S.C. section 360bbb-3(b)(1), unless the authorization is terminated or revoked.  Performed at [facility], [place] [address], [place], [place] [address]      Radiology Studies:  No results found.   Scheduled Meds:   . allopurinol  100 mg Oral Daily  . Chlorhexidine Gluconate Cloth  6 each Topical Q0600  . darbepoetin (ARANESP) injection - DIALYSIS  60 mcg Intravenous Q Mon-HD  . fentaNYL      . heparin  5,000 Units Subcutaneous Q8H  . levothyroxine  137 mcg Oral Q0600  . oxyCODONE      . polyethylene glycol  17 g Oral BID  . senna-docusate  2 tablet Oral BID    Continuous Infusions:   . ceFAZolin       LOS: 5 days     Vernell Leep, MD, [place], [facility]. [facility]    To contact the attending provider between 7A-7P or the covering provider during after hours 7P-7A, please log into the web site [email or web address] and access using universal [place] password for that web site. If you do not have the password, please call the hospital operator.  08/27/2020, 2:49 PM

## 2020-08-27 NOTE — Transfer of Care (Signed)
Immediate Anesthesia Transfer of Care Note  Patient: Craig Flynn  Procedure(s) Performed: LEFT BASILIC VEIN ARTERIOVENOUS (AV) FISTULA CREATION (Left Arm Upper)  Patient Location: PACU  Anesthesia Type:MAC  Level of Consciousness: awake, alert , oriented and patient cooperative  Airway & Oxygen Therapy: Patient Spontanous Breathing and Patient connected to face mask oxygen  Post-op Assessment: Report given to RN and Post -op Vital signs reviewed and stable  Post vital signs: Reviewed and stable  Last Vitals:  Vitals Value Taken Time  BP 128/53 08/27/20 1119  Temp    Pulse 73 08/27/20 1120  Resp 22 08/27/20 1120  SpO2 100 % 08/27/20 1120  Vitals shown include unvalidated device data.  Last Pain:  Vitals:   08/27/20 0849  TempSrc:   PainSc: 0-No pain      Patients Stated Pain Goal: 6 (18/40/37 5436)  Complications: No complications documented.

## 2020-08-27 NOTE — Progress Notes (Signed)
Out Patient HD:  Pt has been accepted @ [facility] [phone]). He will be on their MWF shift w/ a time of 11:40 am. Pt will not be able to start until Friday.   Please call w/ any questions  [facility] [phone]

## 2020-08-27 NOTE — Progress Notes (Signed)
Results for TEMPLE, SPORER (MRN [id number]) as of 08/27/2020 19:32  Ref. Range 08/27/2020 12:57 08/27/2020 14:42 08/27/2020 16:12  Glucose-Capillary Latest Ref Range: 70 - 99 mg/dL 54 (L) 83 75    Pt after  after dialysis was hypoglycemic. He completley refused to have it rechecked after his snack. He stated I want it done after " I eat my lunch that my son is bringing  from [employer] meal" .  HE also refused nurse to administrate IV dextrose. MD notified and ordered glucose chewables  At end of shift pt was eating his hospital dinner and last CBG was 75

## 2020-08-27 NOTE — Anesthesia Procedure Notes (Signed)
Procedure Name: MAC Date/Time: 08/27/2020 9:49 AM Performed by: Colin Benton, CRNA Pre-anesthesia Checklist: Patient identified, Emergency Drugs available, Suction available and Patient being monitored Patient Re-evaluated:Patient Re-evaluated prior to induction Oxygen Delivery Method: Simple face mask Induction Type: IV induction Placement Confirmation: positive ETCO2 Dental Injury: Teeth and Oropharynx as per pre-operative assessment

## 2020-08-27 NOTE — Plan of Care (Signed)
  Problem: Education: Goal: Knowledge of disease and its progression will improve 08/27/2020 1914 by Greggory Keen, RN Outcome: Progressing 08/27/2020 1913 by Greggory Keen, RN Outcome: Progressing   Diabetes Education:  A&Ox, Reports no pain. VSS. Tendency of being hypoglycemic. Encouraged pt to snack as much as possible. Encouraged pt to accept hypoglycemic treatment/ management per our protocol. Pt understands however states he has been managing his blood sugar by eating  the food he likes.  Encouraged pt to request family to bring him his preferred food.

## 2020-08-28 ENCOUNTER — Telehealth: Payer: Self-pay

## 2020-08-28 ENCOUNTER — Encounter (HOSPITAL_COMMUNITY): Payer: Self-pay | Admitting: Vascular Surgery

## 2020-08-28 DIAGNOSIS — T829XXA Unspecified complication of cardiac and vascular prosthetic device, implant and graft, initial encounter: Secondary | ICD-10-CM | POA: Insufficient documentation

## 2020-08-28 DIAGNOSIS — D688 Other specified coagulation defects: Secondary | ICD-10-CM | POA: Insufficient documentation

## 2020-08-28 DIAGNOSIS — T7840XA Allergy, unspecified, initial encounter: Secondary | ICD-10-CM | POA: Insufficient documentation

## 2020-08-28 DIAGNOSIS — I429 Cardiomyopathy, unspecified: Secondary | ICD-10-CM | POA: Insufficient documentation

## 2020-08-28 DIAGNOSIS — N186 End stage renal disease: Secondary | ICD-10-CM | POA: Insufficient documentation

## 2020-08-28 DIAGNOSIS — D010 Carcinoma in situ of colon: Secondary | ICD-10-CM | POA: Insufficient documentation

## 2020-08-28 LAB — RENAL FUNCTION PANEL
Albumin: 2.9 g/dL — ABNORMAL LOW (ref 3.5–5.0)
Anion gap: 11 (ref 5–15)
BUN: 46 mg/dL — ABNORMAL HIGH (ref 6–20)
CO2: 25 mmol/L (ref 22–32)
Calcium: 8.7 mg/dL — ABNORMAL LOW (ref 8.9–10.3)
Chloride: 96 mmol/L — ABNORMAL LOW (ref 98–111)
Creatinine, Ser: 6.94 mg/dL — ABNORMAL HIGH (ref 0.61–1.24)
GFR, Estimated: 8 mL/min — ABNORMAL LOW (ref >60)
Glucose, Bld: 86 mg/dL (ref 70–99)
Phosphorus: 2.7 mg/dL (ref 2.5–4.6)
Potassium: 3.8 mmol/L (ref 3.5–5.1)
Sodium: 132 mmol/L — ABNORMAL LOW (ref 135–145)

## 2020-08-28 LAB — CBC
HCT: 24.4 % — ABNORMAL LOW (ref 39.0–52.0)
Hemoglobin: 7.6 g/dL — ABNORMAL LOW (ref 13.0–17.0)
MCH: 27.2 pg (ref 26.0–34.0)
MCHC: 31.1 g/dL (ref 30.0–36.0)
MCV: 87.5 fL (ref 80.0–100.0)
Platelets: 169 10*3/uL (ref 150–400)
RBC: 2.79 MIL/uL — ABNORMAL LOW (ref 4.22–5.81)
RDW: 12.8 % (ref 11.5–15.5)
WBC: 5.8 10*3/uL (ref 4.0–10.5)
nRBC: 0 % (ref 0.0–0.2)

## 2020-08-28 LAB — GLUCOSE, CAPILLARY
Glucose-Capillary: 59 mg/dL — ABNORMAL LOW (ref 70–99)
Glucose-Capillary: 73 mg/dL (ref 70–99)
Glucose-Capillary: 79 mg/dL (ref 70–99)
Glucose-Capillary: 86 mg/dL (ref 70–99)

## 2020-08-28 MED ORDER — SENNOSIDES-DOCUSATE SODIUM 8.6-50 MG PO TABS: 2.0000 | ORAL_TABLET | Freq: Two times a day (BID) | ORAL | 0 refills | Status: DC

## 2020-08-28 MED ORDER — POLYETHYLENE GLYCOL 3350 17 G PO PACK: 17.0000 g | PACK | Freq: Two times a day (BID) | ORAL | 0 refills | Status: DC

## 2020-08-28 MED ORDER — POLYSACCHARIDE IRON COMPLEX 150 MG PO CAPS: 150.0000 mg | ORAL_CAPSULE | Freq: Every day | ORAL | 0 refills | Status: DC

## 2020-08-28 MED ORDER — HEPARIN SODIUM (PORCINE) 1000 UNIT/ML DIALYSIS
20.0000 [IU]/kg | INTRAMUSCULAR | Status: DC | PRN
  Filled 2020-08-28: qty 3

## 2020-08-28 NOTE — Procedures (Signed)
I was present at this dialysis session. I have reviewed the session itself and made appropriate changes.   Vital signs in last 24 hours:  Temp:  [97.5 F (36.4 C)-100.1 F (37.8 C)] 98.7 F (37.1 C) (06/08 0659) Pulse Rate:  [68-81] 81 (06/08 0900) Resp:  [14-22] 17 (06/08 0659) BP: (128-144)/(47-70) 131/69 (06/08 0900) SpO2:  [99 %-100 %] 100 % (06/08 0659) Weight change:  Filed Weights   08/23/20 1821 08/26/20 1051 08/26/20 1409  Weight: 115 kg 116.3 kg 114.8 kg    Recent Labs  Lab 08/28/20 0710  NA 132*  K 3.8  CL 96*  CO2 25  GLUCOSE 86  BUN 46*  CREATININE 6.94*  CALCIUM 8.7*  PHOS 2.7    Recent Labs  Lab 08/21/20 2241 08/22/20 0506 08/26/20 1030 08/27/20 0043 08/28/20 0710  WBC 8.9   < > 6.1 6.2 5.8  NEUTROABS 8.0*  --   --   --   --   HGB 10.8*   < > 8.2* 8.2* 7.6*  HCT 32.2*   < > 25.7* 25.7* 24.4*  MCV 81.9   < > 85.4 86.2 87.5  PLT 256   < > 167 168 169   < > = values in this interval not displayed.    Scheduled Meds: . allopurinol  100 mg Oral Daily  . Chlorhexidine Gluconate Cloth  6 each Topical Q0600  . darbepoetin (ARANESP) injection - DIALYSIS  60 mcg Intravenous Q Mon-HD  . heparin  5,000 Units Subcutaneous Q8H  . levothyroxine  137 mcg Oral Q0600  . polyethylene glycol  17 g Oral BID  . senna-docusate  2 tablet Oral BID   Continuous Infusions: PRN Meds:.acetaminophen, bisacodyl, glucose, heparin, HYDROmorphone (DILAUDID) injection, melatonin, ondansetron (ZOFRAN) IV    Assessment/Plan:  1. Acute pancreatitis:improving symptomatically with bowel rest and now started on soft nonfatty diet. Appears to have clinical improvement along with improvement of lipase levels.  He is having some episodes of mild hypoglycemia.  Will need further evaluation as an outpatient.  In the meantime will encourage frequent meals. 2. End-stage renal disease: With underlying progressive chronic kidney disease and now development of assorted uremic symptoms  prompting initiation of dialysis-had2nddialysis treatment6/3/22and currently on his 34rd today. He underwentand placement of TDC by IR on 6/2 and first stage,left brachiobasilic AVF creation by Dr. Donnetta Hutching on 08/27/20. 3. Anemia of chronic disease:Hemoglobin and hematocrit trending down, will give Aranesp with hemodialysis tomorrow. 4. Chronic kidney disease/metabolic bone disease: He was hypercalcemic as an outpatient andcalcium level now trending down. 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 5. Hypertension: Monitor with ongoing pain management and hemodialysis/ultrafiltration.  Told that he no longer needs diuretics. 6. Hypokalemia: Using 4K bath with HD today. 7. Disposition: He is set for outpatient HD at [facility] on MWF schedule and will need to be there before 11:40 am on Friday 08/30/20.   Donetta Potts,  MD 08/28/2020, 9:58 AM

## 2020-08-28 NOTE — Progress Notes (Signed)
Blood pressure (!) 151/62, pulse 71, temperature 97.7 F (36.5 C), temperature source Oral, resp. rate 18, height 6\' 2"  (1.88 m), weight 121 kg, SpO2 100 %.  Results for Craig Flynn, Craig Flynn (MRN [id number]) as of 08/28/2020 14:48  Ref. Range 08/28/2020 12:36 08/28/2020 13:05  Glucose-Capillary Latest Ref Range: 70 - 99 mg/dL 59 (L) 73  Alert and oriented pain 0/10 at discharge. Discharge education/instruction done. All questions welcomed and answered accordingly. Pt awaiting his ride in the room

## 2020-08-28 NOTE — Discharge Summary (Signed)
Physician Discharge Summary  TREYVONE CHELF DJS:[id number] DOB: 1960/05/13 DOA: 08/21/2020  PCP: Biagio Borg, MD  Admit date: 08/21/2020 Discharge date: 08/28/2020  Admitted From: Home Disposition: Home   Recommendations for Outpatient Follow-up:  Follow up with PCP in 1-2 weeks Follow up with GI in 6-8 weeks to arrange advanced imaging of pancreas Follow up with vascular surgery in 4-6 weeks with fistula duplex Continue routine HD through dialysis catheter MWF (first is 6/10)  Home Health: None Equipment/Devices: None, continue HD catheter (TDC placed 6/2) Discharge Condition: Stable CODE STATUS: Full Diet recommendation: Renal, low fat  Brief/Interim Summary: Craig Flynn is a 60 y.o. male with a history including CKD 5 (with plan for HD access and starting HD soon), thyroid cancer with postoperative hypothyroidism, HTN, chronic HFpEF who presented 6/1 with abdominal pain, N/V found to have acute pancreatitis as well as progression to ESRD with early uremic symptoms. Nephrology consulted, Nar[place]th placed by IR, and started HD 6/3. Vascular surgery placed a left first stage brachiobasilic fistula on 6/7. Abdominal pain has improved, and the patient has continued routine HD awaiting outpatient dialysis spot, which has been arranged to initiate 6/10.    Discharge Diagnoses:  Active Problems:   Acute pancreatitis  Acute pancreatitis without fluid collection: Initial episode, presumably idiopathic. Lipase 1378 on admission, stubborn initially due to ESRD, improved level after HD. LFTs unremarkable. S/p remote cholecystectomy.  TG: 162 (continue fenofibrate).  No history of alcohol use, recent medication changes. - GI recommend outpatient follow-up for advanced pancreatic imaging in 6 to 8 weeks and will arrange follow-up with Dr. [name].  - Clinically improved with supportive measures. Low fat diet, avoidance of alcohol, etc. recommended.   New ESRD with early uremia/high anion gap metabolic  acidosis:  - Continue HD thru [facility] placed by IR, started HD 6/3. - Follow up with [facility] in 4-6 weeks s/p left first stage brachiobasilic fistula 6/7 by Dr. Donnetta Hutching, they are to arrange duplex. - Pt has been accepted @ [facility] [phone]). He will be on their MWF shift w/ a time of 11:40 am. Pt will not be able to start until Friday.     Hypercalcemia: This has resolved.  - Per nephrology, he was hypercalcemic as an outpatient.  - DC calcitriol and calcium carbonate    Chronic constipation:  - Continue regular bowel regimen. Abd benign   Essential hypertension: Rising BP.  - Restart home coreg, ASA and imdur due to history of CAD. Will hold hydralazine for now to make room for dialysis. No longer needs diuretics.    Anemia in chronic kidney disease:  - Aranesp per nephrology   Post-thyroidectomy hypothyroidism:  - Continue levothyroxine.   GERD:  - Continue PPI   Gout: No acute flare.   - Continue allopurinol.   Hypoglycemia: Suspect infrequent po intake and perhaps impaired glucagon secretion w/pancreatitis. - Encouraged frequent meals and continue management as above.    Obesity: Body mass index is 34.53 kg/m.   Discharge Instructions Discharge Instructions     Call MD for:  difficulty breathing, headache or visual disturbances   Complete by: As directed    Call MD for:  persistant dizziness or light-headedness   Complete by: As directed    Call MD for:  persistant nausea and vomiting   Complete by: As directed    Call MD for:  redness, tenderness, or signs of infection (pain, swelling, redness, odor or green/yellow discharge around incision site)   Complete by:  As directed    Call MD for:  severe uncontrolled pain   Complete by: As directed    Call MD for:  temperature >100.4   Complete by: As directed    Diet general   Complete by: As directed    Renal with fluid restriction and low fat, eat frequent meals   Discharge instructions   Complete  by: As directed    You were evaluated for pancreatitis which has improved. You will need to follow up with GI (Dr. Lynne Leader office) in 6-8 weeks for this and continue on a low fat diet.  You were started on dialysis and will continue this Monday, Wednesday, Friday as discussed, be there at or before 11:40am on Friday, 6/10.   Stop taking lasix and hydralazine because your blood pressure may go too low now that you're on dialysis.   Follow up with vascular surgery in about 5 weeks to continue managing the fistula.   No dressing needed   Complete by: As directed       Allergies as of 08/28/2020   No Known Allergies      Medication List     STOP taking these medications    calcitRIOL 0.5 MCG capsule Commonly known as: ROCALTROL   calcium elemental as carbonate 400 MG chewable tablet Commonly known as: BARIATRIC TUMS ULTRA   furosemide 80 MG tablet Commonly known as: LASIX   hydrALAZINE 25 MG tablet Commonly known as: APRESOLINE   Potassium Chloride ER 20 MEQ Tbcr       TAKE these medications    acetaminophen 500 MG tablet Commonly known as: TYLENOL Take 2 tablets (1,000 mg total) by mouth every 8 (eight) hours as needed for mild pain or headache.   albuterol 108 (90 Base) MCG/ACT inhaler Commonly known as: VENTOLIN HFA INHALE 2 PUFFS INTO THE LUNGS EVERY 6 HOURS AS NEEDED FOR WHEEZING OR SHORTNESS OF BREATH   allopurinol 100 MG tablet Commonly known as: ZYLOPRIM Take 150 mg by mouth daily. Taking 1 & 1/2 tab= 150 mg   aspirin EC 81 MG tablet Take 81 mg by mouth daily.   carvedilol 25 MG tablet Commonly known as: COREG TAKE 1 TABLET(25 MG) BY MOUTH TWICE DAILY What changed: See the new instructions.   cyclobenzaprine 10 MG tablet Commonly known as: FLEXERIL Take 1 tablet (10 mg total) by mouth 2 (two) times daily as needed for muscle spasms.   fenofibrate 54 MG tablet TAKE 1 TABLET BY MOUTH EVERY DAY   iron polysaccharides 150 MG capsule Commonly known  as: NIFEREX Take 150 mg by mouth daily.   isosorbide mononitrate 30 MG 24 hr tablet Commonly known as: IMDUR TAKE 2 TABLETS(60 MG) BY MOUTH DAILY What changed: See the new instructions.   levothyroxine 137 MCG tablet Commonly known as: SYNTHROID Take 137 mcg by mouth every morning.   nitroGLYCERIN 0.4 MG SL tablet Commonly known as: NITROSTAT DISSOLVE 1 TABLET UNDER THE TONGUE EVERY 5 MINUTES AS NEEDED FOR CHEST PAIN What changed: See the new instructions.   pantoprazole 40 MG tablet Commonly known as: PROTONIX Take 1 tablet (40 mg total) by mouth 2 (two) times daily before a meal.   polyethylene glycol 17 g packet Commonly known as: MIRALAX / GLYCOLAX Take 17 g by mouth 2 (two) times daily.   senna-docusate 8.6-50 MG tablet Commonly known as: Senokot-S Take 2 tablets by mouth 2 (two) times daily.   thiamine 50 MG tablet Commonly known as: VITAMIN B-1 Take 1 tablet (50  mg total) by mouth daily.               Discharge Care Instructions  (From admission, onward)           Start     Ordered   08/28/20 0000  No dressing needed        08/28/20 1135            Follow-up Information     [facility] Follow up in 5 week(s).   Specialty: Vascular Surgery Contact information: [address] [place] [phone]        Biagio Borg, MD Follow up.   Specialties: Internal Medicine, Radiology Contact information: [place] [address] [phone]         Justin Mend, MD Follow up.   Specialty: Internal Medicine Contact information: [place] [address] [phone]         Ladene Artist, MD. Schedule an appointment as soon as possible for a visit in 8 week(s).   Specialty: Gastroenterology Contact information: [address] [place] [address] [phone]         [facility] Go on 08/30/2020.   Why: before  11:40am Contact information: [place] [address] [phone]                No Known Allergies  Consultations: Nephrology GI  Procedures/Studies: CT ABDOMEN PELVIS WO CONTRAST  Result Date: 08/21/2020 CLINICAL DATA:  Abdominal pain, nausea and vomiting for 2 days, tenderness to palpation EXAM: CT ABDOMEN AND PELVIS WITHOUT CONTRAST TECHNIQUE: Multidetector CT imaging of the abdomen and pelvis was performed following the standard protocol without IV contrast. COMPARISON:  03/26/2020 FINDINGS: Lower chest: No acute pleural or parenchymal lung disease. Hepatobiliary: Gallbladder is surgically absent. Unenhanced imaging of the liver demonstrates no focal abnormality. No biliary duct dilation. Pancreas: Significant inflammatory changes surround the head and body of the pancreas, consistent with acute pancreatitis. No evidence of fluid collection, pseudocyst, or abscess. Spleen: Normal in size without focal abnormality. Adrenals/Urinary Tract: The adrenals are stable. Bilateral renal cortical atrophy unchanged. No urinary tract calculi or obstructive uropathy. Bladder is decompressed which limits its evaluation. Stomach/Bowel: No bowel obstruction or ileus. Normal appendix right lower quadrant. No bowel wall thickening or inflammatory change. Vascular/Lymphatic: Aortic atherosclerosis. No enlarged abdominal or pelvic lymph nodes. Reproductive: Prostate is unremarkable. Other: Trace mesenteric free fluid within the upper abdomen related to pancreatitis. No free gas. Stable small umbilical hernia. Musculoskeletal: No acute or destructive bony lesions. Reconstructed images demonstrate no additional findings. IMPRESSION: 1. Acute uncomplicated pancreatitis. No fluid collection, pseudocyst, or abscess. 2. Stable small umbilical hernia. 3.  Aortic Atherosclerosis (ICD10-I70.0). Electronically Signed   By: Randa Ngo M.D.   On: 08/21/2020 23:22   DG Chest 2 View  Result Date:  07/31/2020 CLINICAL DATA:  Chest pain EXAM: CHEST - 2 VIEW COMPARISON:  06/06/2020 FINDINGS: Normal heart size and mediastinal contours. No acute infiltrate or edema. No effusion or pneumothorax. No acute osseous findings. IMPRESSION: No active cardiopulmonary disease. Electronically Signed   By: Monte Fantasia M.D.   On: 07/31/2020 04:42   IR Fluoro Guide CV Line Right  Result Date: 08/22/2020 CLINICAL DATA:  Chronic kidney disease, stage V, and need for tunneled hemodialysis catheter to begin immediate dialysis. EXAM: TUNNELED CENTRAL VENOUS HEMODIALYSIS CATHETER PLACEMENT WITH ULTRASOUND AND FLUOROSCOPIC GUIDANCE ANESTHESIA/SEDATION: 1.0 mg IV Versed; 25 mcg IV Fentanyl. Total Moderate  Sedation Time:   21 minutes. The patient's level of consciousness and physiologic status were continuously monitored during the procedure by Radiology nursing. MEDICATIONS: The patient received 2 g of IV Ancef prior to the procedure. FLUOROSCOPY TIME:  6 seconds.  1.0 mGy. PROCEDURE: The procedure, risks, benefits, and alternatives were explained to the patient. Questions regarding the procedure were encouraged and answered. The patient understands and consents to the procedure. A timeout was performed prior to initiating the procedure. The right neck and chest were prepped with chlorhexidine in a sterile fashion, and a sterile drape was applied covering the operative field. Maximum barrier sterile technique with sterile gowns and gloves were used for the procedure. Local anesthesia was provided with 1% lidocaine. Ultrasound was used to confirm patency of the right internal jugular vein. After creating a small venotomy incision, a 21 gauge needle was advanced into the right internal jugular vein under direct, real-time ultrasound guidance. Ultrasound image documentation was performed. After securing guidewire access, an 8 Fr dilator was placed. A J-wire was kinked to measure appropriate catheter length. A Palindrome tunneled  hemodialysis catheter measuring 23 cm from tip to cuff was chosen for placement. This was tunneled in a retrograde fashion from the chest wall to the venotomy incision. At the venotomy, serial dilatation was performed and a 15 Fr peel-away sheath was placed over a guidewire. The catheter was then placed through the sheath and the sheath removed. Final catheter positioning was confirmed and documented with a fluoroscopic spot image. The catheter was aspirated, flushed with saline, and injected with appropriate volume heparin dwells. The venotomy incision was closed with subcuticular 4-0 Vicryl. Dermabond was applied to the incision. The catheter exit site was secured with 0-Prolene retention sutures. COMPLICATIONS: None.  No pneumothorax. FINDINGS: After catheter placement, the tip lies in the right atrium. The catheter aspirates normally and is ready for immediate use. IMPRESSION: Placement of tunneled hemodialysis catheter via the right internal jugular vein. The catheter tip lies in the right atrium. The catheter is ready for immediate use. Electronically Signed   By: Aletta Edouard M.D.   On: 08/22/2020 17:10   IR US Guide Vasc Access Right  Result Date: 08/22/2020 CLINICAL DATA:  Chronic kidney disease, stage V, and need for tunneled hemodialysis catheter to begin immediate dialysis. EXAM: TUNNELED CENTRAL VENOUS HEMODIALYSIS CATHETER PLACEMENT WITH ULTRASOUND AND FLUOROSCOPIC GUIDANCE ANESTHESIA/SEDATION: 1.0 mg IV Versed; 25 mcg IV Fentanyl. Total Moderate Sedation Time:   21 minutes. The patient's level of consciousness and physiologic status were continuously monitored during the procedure by Radiology nursing. MEDICATIONS: The patient received 2 g of IV Ancef prior to the procedure. FLUOROSCOPY TIME:  6 seconds.  1.0 mGy. PROCEDURE: The procedure, risks, benefits, and alternatives were explained to the patient. Questions regarding the procedure were encouraged and answered. The patient understands and  consents to the procedure. A timeout was performed prior to initiating the procedure. The right neck and chest were prepped with chlorhexidine in a sterile fashion, and a sterile drape was applied covering the operative field. Maximum barrier sterile technique with sterile gowns and gloves were used for the procedure. Local anesthesia was provided with 1% lidocaine. Ultrasound was used to confirm patency of the right internal jugular vein. After creating a small venotomy incision, a 21 gauge needle was advanced into the right internal jugular vein under direct, real-time ultrasound guidance. Ultrasound image documentation was performed. After securing guidewire access, an 8 Fr dilator was placed. A J-wire was kinked to measure appropriate  catheter length. A Palindrome tunneled hemodialysis catheter measuring 23 cm from tip to cuff was chosen for placement. This was tunneled in a retrograde fashion from the chest wall to the venotomy incision. At the venotomy, serial dilatation was performed and a 15 Fr peel-away sheath was placed over a guidewire. The catheter was then placed through the sheath and the sheath removed. Final catheter positioning was confirmed and documented with a fluoroscopic spot image. The catheter was aspirated, flushed with saline, and injected with appropriate volume heparin dwells. The venotomy incision was closed with subcuticular 4-0 Vicryl. Dermabond was applied to the incision. The catheter exit site was secured with 0-Prolene retention sutures. COMPLICATIONS: None.  No pneumothorax. FINDINGS: After catheter placement, the tip lies in the right atrium. The catheter aspirates normally and is ready for immediate use. IMPRESSION: Placement of tunneled hemodialysis catheter via the right internal jugular vein. The catheter tip lies in the right atrium. The catheter is ready for immediate use. Electronically Signed   By: Aletta Edouard M.D.   On: 08/22/2020 17:10   VAS [place] UPPER EXT VEIN  MAPPING (PRE-OP AVF)  Result Date: 08/25/2020 [place] MAPPING Patient Name:  BOHDEN DUNG  Date of Exam:   08/24/2020 Medical Rec #: [id number]      Accession #:    [id number] Date of Birth: September 28, 1960     Patient Gender: M Patient Age:   25Y Exam Location:  [facility] Procedure:      VAS [place] UPPER EXT VEIN MAPPING (PRE-OP AVF) Referring Phys: [id number] JAY PATEL --------------------------------------------------------------------------------  History: Left failed AVF upper arm.  Comparison Study: 09/14/2019 Performing Technologist: Oda Cogan RDMS, RVT  Examination Guidelines: A complete evaluation includes B-mode imaging, spectral Doppler, color Doppler, and power Doppler as needed of all accessible portions of each vessel. Bilateral testing is considered an integral part of a complete examination. Limited examinations for reoccurring indications may be performed as noted. +-----------------+-------------+----------+--------+ Right Cephalic   Diameter (cm)Depth (cm)Findings +-----------------+-------------+----------+--------+ Prox upper arm       0.45        1.00            +-----------------+-------------+----------+--------+ Mid upper arm        0.27        0.55            +-----------------+-------------+----------+--------+ Dist upper arm       0.42        0.65            +-----------------+-------------+----------+--------+ Antecubital fossa    0.45        0.38            +-----------------+-------------+----------+--------+ Prox forearm         0.26        0.49            +-----------------+-------------+----------+--------+ Mid forearm          0.14                        +-----------------+-------------+----------+--------+ +-----------------+-------------+----------+---------+ Right Basilic    Diameter (cm)Depth (cm)Findings  +-----------------+-------------+----------+---------+ Prox upper arm       0.56                          +-----------------+-------------+----------+---------+ Mid upper arm        0.41                         +-----------------+-------------+----------+---------+  Dist upper arm       0.34               branching +-----------------+-------------+----------+---------+ Antecubital fossa    0.29                         +-----------------+-------------+----------+---------+ Prox forearm         0.22                         +-----------------+-------------+----------+---------+ Mid forearm          0.19               branching +-----------------+-------------+----------+---------+ +-----------------+-------------+----------+---------+ Left Cephalic    Diameter (cm)Depth (cm)Findings  +-----------------+-------------+----------+---------+ Antecubital fossa    0.29               branching +-----------------+-------------+----------+---------+ Prox forearm         0.17                         +-----------------+-------------+----------+---------+ +-----------------+-------------+----------+---------+ Left Basilic     Diameter (cm)Depth (cm)Findings  +-----------------+-------------+----------+---------+ Shoulder             0.93                         +-----------------+-------------+----------+---------+ Prox upper arm       0.50                         +-----------------+-------------+----------+---------+ Mid upper arm        0.29               branching +-----------------+-------------+----------+---------+ Dist upper arm       0.36                         +-----------------+-------------+----------+---------+ Antecubital fossa    0.31               branching +-----------------+-------------+----------+---------+ Prox forearm         0.13                         +-----------------+-------------+----------+---------+ Mid forearm          0.16                         +-----------------+-------------+----------+---------+ *See table(s) above for  measurements and observations.  Diagnosing physician: Ruta Hinds MD Electronically signed by Ruta Hinds MD on 08/25/2020 at 5:09:53 PM.    Final     TDC placed by IR Hemodialysis Left first stage brachial basilic fistula creation by vascular surgery 6/7.  Subjective: Tolerated dialysis, eating a good diet, no dyspnea or swelling. Pain controlled.   Discharge Exam: Vitals:   08/28/20 1100 08/28/20 1129  BP: (!) 141/75 (!) 155/71  Pulse: 73 76  Resp:    Temp:    SpO2:     General: Pt is alert, awake, not in acute distress Cardiovascular: RRR, S1/S2 +, no rubs, no gallops Respiratory: CTA bilaterally, no wheezing, no rhonchi Abdominal: Soft, NT, ND, bowel sounds + Extremities: No edema, no cyanosis. +thrill and bruit at Left AVF. TDC c/d/I without tenderness or erythema. No palpable or visible abnormality on left chest wall.  Labs: BNP (last 3 results) No results for input(s): BNP in  the last 8760 hours. Basic Metabolic Panel: Recent Labs  Lab 08/22/20 0506 08/22/20 0621 08/23/20 0054 08/24/20 0054 08/26/20 1030 08/28/20 0710  NA 129* 131* 131* 133* 133* 132*  K 3.8 3.8 3.7 3.2* 3.8 3.8  CL 94*  --  93* 95* 97* 96*  CO2 20*  --  22 25 27 25   GLUCOSE 109*  --  83 67* 99 86  BUN 154*  --  156* 107* 73* 46*  CREATININE 15.48*  --  15.53* 11.09* 8.79* 6.94*  CALCIUM 11.7*  --  10.6* 9.8 9.2 8.7*  MG 2.0  --   --   --   --   --   PHOS 7.1*  --   --   --   --  2.7   Liver Function Tests: Recent Labs  Lab 08/21/20 2241 08/22/20 0506 08/23/20 0054 08/24/20 0054 08/26/20 1030 08/28/20 0710  AST 19 18 16 20 17   --   ALT 19 15 15 11 10   --   ALKPHOS 33* 28* 28* 33* 29*  --   BILITOT 0.8 0.6 0.6 0.6 0.5  --   PROT 7.2 6.5 6.1* 6.2* 5.8*  --   ALBUMIN 4.4 3.9 3.5 3.3* 3.0* 2.9*   Recent Labs  Lab 08/21/20 2241 08/22/20 0506 08/23/20 0054 08/24/20 0054  LIPASE 1,206* 1,378* 1,294* 332*   No results for input(s): AMMONIA in the last 168 hours. CBC: Recent  Labs  Lab 08/21/20 2241 08/22/20 0506 08/23/20 1906 08/24/20 0054 08/26/20 1030 08/27/20 0043 08/28/20 0710  WBC 8.9   < > 8.8 7.4 6.1 6.2 5.8  NEUTROABS 8.0*  --   --   --   --   --   --   HGB 10.8*   < > 10.3* 9.4* 8.2* 8.2* 7.6*  HCT 32.2*   < > 30.6* 28.7* 25.7* 25.7* 24.4*  MCV 81.9   < > 81.2 82.0 85.4 86.2 87.5  PLT 256   < > 175 167 167 168 169   < > = values in this interval not displayed.   Cardiac Enzymes: No results for input(s): CKTOTAL, CKMB, CKMBINDEX, TROPONINI in the last 168 hours. BNP: Invalid input(s): POCBNP CBG: Recent Labs  Lab 08/27/20 1442 08/27/20 1612 08/27/20 2107 08/28/20 0013 08/28/20 0402  GLUCAP 83 75 97 79 86   D-Dimer No results for input(s): DDIMER in the last 72 hours. Hgb A1c No results for input(s): HGBA1C in the last 72 hours. Lipid Profile No results for input(s): CHOL, HDL, LDLCALC, TRIG, CHOLHDL, LDLDIRECT in the last 72 hours. Thyroid function studies No results for input(s): TSH, T4TOTAL, T3FREE, THYROIDAB in the last 72 hours.  Invalid input(s): FREET3 Anemia work up Recent Labs    08/27/20 0043  FERRITIN 550*  TIBC 316  IRON 41*   Urinalysis    Component Value Date/Time   COLORURINE YELLOW 08/21/2020 [place] 08/21/2020 2241   LABSPEC 1.013 08/21/2020 2241   PHURINE 5.0 08/21/2020 2241   GLUCOSEU NEGATIVE 08/21/2020 2241   HGBUR NEGATIVE 08/21/2020 2241   HGBUR negative 05/16/2008 0926   BILIRUBINUR NEGATIVE 08/21/2020 2241   KETONESUR NEGATIVE 08/21/2020 2241   PROTEINUR 30 (A) 08/21/2020 2241   UROBILINOGEN 0.2 11/17/2013 0748   NITRITE NEGATIVE 08/21/2020 2241   [place] 08/21/2020 2241    Microbiology Recent Results (from the past 240 hour(s))  Resp Panel by RT-PCR (Flu A&B, Covid) Nasopharyngeal Swab     Status: None   Collection Time: 08/22/20  2:55 AM   Specimen: Nasopharyngeal Swab; Nasopharyngeal(NP) swabs in vial transport medium  Result Value Ref Range Status    SARS Coronavirus 2 by RT PCR NEGATIVE NEGATIVE Final    Comment: (NOTE) SARS-CoV-2 target nucleic acids are NOT DETECTED.  The SARS-CoV-2 RNA is generally detectable in upper respiratory specimens during the acute phase of infection. The lowest concentration of SARS-CoV-2 viral copies this assay can detect is 138 copies/mL. A negative result does not preclude SARS-Cov-2 infection and should not be used as the sole basis for treatment or other patient management decisions. A negative result may occur with  improper specimen collection/handling, submission of specimen other than nasopharyngeal swab, presence of viral mutation(s) within the areas targeted by this assay, and inadequate number of viral copies(<138 copies/mL). A negative result must be combined with clinical observations, patient history, and epidemiological information. The expected result is Negative.  Fact Sheet for Patients:  EntrepreneurPulse.com.au  Fact Sheet for Healthcare Providers:  IncredibleEmployment.be  This test is no t yet approved or cleared by the [place] FDA and  has been authorized for detection and/or diagnosis of SARS-CoV-2 by FDA under an Emergency Use Authorization (EUA). This EUA will remain  in effect (meaning this test can be used) for the duration of the COVID-19 declaration under Section 564(b)(1) of the Act, 21 U.S.C.section 360bbb-3(b)(1), unless the authorization is terminated  or revoked sooner.       Influenza A by PCR NEGATIVE NEGATIVE Final   Influenza B by PCR NEGATIVE NEGATIVE Final    Comment: (NOTE) The Xpert Xpress SARS-CoV-2/FLU/RSV plus assay is intended as an aid in the diagnosis of influenza from Nasopharyngeal swab specimens and should not be used as a sole basis for treatment. Nasal washings and aspirates are unacceptable for Xpert Xpress SARS-CoV-2/FLU/RSV testing.  Fact Sheet for  Patients: EntrepreneurPulse.com.au  Fact Sheet for Healthcare Providers: IncredibleEmployment.be  This test is not yet approved or cleared by the [place] FDA and has been authorized for detection and/or diagnosis of SARS-CoV-2 by FDA under an Emergency Use Authorization (EUA). This EUA will remain in effect (meaning this test can be used) for the duration of the COVID-19 declaration under Section 564(b)(1) of the Act, 21 U.S.C. section 360bbb-3(b)(1), unless the authorization is terminated or revoked.  Performed at [facility], [place] [address], [place], [place] [address]     Time coordinating discharge: Approximately 40 minutes  Patrecia Pour, MD  [facility] 08/28/2020, 11:36 AM

## 2020-08-28 NOTE — Telephone Encounter (Signed)
Left message for patient to call back  

## 2020-08-28 NOTE — Anesthesia Preprocedure Evaluation (Signed)
Anesthesia Evaluation  Patient identified by MRN, date of birth, ID band Patient awake    Reviewed: Allergy & Precautions, NPO status , Patient's Chart, lab work & pertinent test results  Airway Mallampati: II  TM Distance: >3 FB     Dental   Pulmonary Current Smoker and Patient abstained from smoking.,    breath sounds clear to auscultation       Cardiovascular hypertension,  Rhythm:Regular Rate:Normal     Neuro/Psych    GI/Hepatic   Endo/Other    Renal/GU      Musculoskeletal   Abdominal   Peds  Hematology   Anesthesia Other Findings   Reproductive/Obstetrics                             Anesthesia Physical Anesthesia Plan  ASA: III  Anesthesia Plan: MAC   Post-op Pain Management:    Induction: Intravenous  PONV Risk Score and Plan: Ondansetron and Propofol infusion  Airway Management Planned: Natural Airway and Simple Face Mask  Additional Equipment:   Intra-op Plan:   Post-operative Plan:   Informed Consent: I have reviewed the patients History and Physical, chart, labs and discussed the procedure including the risks, benefits and alternatives for the proposed anesthesia with the patient or authorized representative who has indicated his/her understanding and acceptance.       Plan Discussed with: CRNA and Anesthesiologist  Anesthesia Plan Comments:         Anesthesia Quick Evaluation

## 2020-08-28 NOTE — Anesthesia Postprocedure Evaluation (Signed)
Anesthesia Post Note  Patient: Craig Flynn  Procedure(s) Performed: LEFT BASILIC VEIN ARTERIOVENOUS (AV) FISTULA CREATION (Left Arm Upper)     Patient location during evaluation: PACU Anesthesia Type: MAC Level of consciousness: awake and alert Pain management: pain level controlled Vital Signs Assessment: post-procedure vital signs reviewed and stable Respiratory status: spontaneous breathing, nonlabored ventilation, respiratory function stable and patient connected to nasal cannula oxygen Cardiovascular status: stable and blood pressure returned to baseline Postop Assessment: no apparent nausea or vomiting Anesthetic complications: no   No complications documented.  Last Vitals:  Vitals:   08/28/20 1100 08/28/20 1129  BP: (!) 141/75 (!) 155/71  Pulse: 73 76  Resp:    Temp:    SpO2:      Last Pain:  Vitals:   08/28/20 0826  TempSrc: Oral  PainSc:                  Magnum Lunde COKER

## 2020-08-28 NOTE — Progress Notes (Addendum)
  Progress Note    08/28/2020 7:45 AM 1 Day Post-Op  Subjective:  Some bleeding from incision overnight however this has resolved; Denies any symptoms of steal.   Vitals:   08/28/20 0221 08/28/20 0659  BP: (!) 131/47 138/70  Pulse: 75 79  Resp:  17  Temp: 98.5 F (36.9 C) 98.7 F (37.1 C)  SpO2: 99% 100%   Physical Exam: Lungs:  Non labored Incisions:  L arm incision without further bleeding; no firm hematoma Extremities:  1+ L radial pulse; palpable thrill in basilic vein of mid upper arm Neurologic: A&O  CBC    Component Value Date/Time   WBC 6.2 08/27/2020 0043   RBC 2.98 (L) 08/27/2020 0043   HGB 8.2 (L) 08/27/2020 0043   HCT 25.7 (L) 08/27/2020 0043   PLT 168 08/27/2020 0043   MCV 86.2 08/27/2020 0043   MCH 27.5 08/27/2020 0043   MCHC 31.9 08/27/2020 0043   RDW 12.8 08/27/2020 0043   LYMPHSABS 0.3 (L) 08/21/2020 2241   MONOABS 0.4 08/21/2020 2241   EOSABS 0.1 08/21/2020 2241   BASOSABS 0.0 08/21/2020 2241    BMET    Component Value Date/Time   NA 133 (L) 08/26/2020 1030   K 3.8 08/26/2020 1030   CL 97 (L) 08/26/2020 1030   CO2 27 08/26/2020 1030   GLUCOSE 99 08/26/2020 1030   GLUCOSE 130 (H) 03/03/2006 0910   BUN 73 (H) 08/26/2020 1030   CREATININE 8.79 (H) 08/26/2020 1030   CREATININE 4.09 (H) 09/12/2015 1529   CALCIUM 9.2 08/26/2020 1030   GFRNONAA 6 (L) 08/26/2020 1030   GFRAA 11 (L) 11/06/2019 0014    INR No results found for: INR   Intake/Output Summary (Last 24 hours) at 08/28/2020 0745 Last data filed at 08/28/2020 0603 Gross per 24 hour  Intake 910 ml  Output 210 ml  Net 700 ml     Assessment/Plan:  60 y.o. male is s/p L 1st stage basilic vein fistula 1 Day Post-Op   Patent L basilic vein fistula with palpable thrill and no signs or symptoms of steal Bleeding from incision has resolved; no firm hematoma Ok for discharge from vascular standpoint; office will arrange fistula duplex in 4-6 weeks   Dagoberto Ligas, PA-C [facility] [phone] 08/28/2020 7:45 AM  I have examined the patient, reviewed and agree with above.  Curt Jews, MD 08/28/2020 12:22 PM

## 2020-08-28 NOTE — Telephone Encounter (Signed)
-----   Message from Irene Shipper, MD sent at 08/24/2020 12:38 PM EDT ----- Regarding: [place] office follow-up Craig Flynn, Patient of Dr. [name].  Needs post hospital follow-up.  My last progress note pasted below. Thanks, JP  ASSESSMENT:  1.  Acute pancreatitis.  Mild and improving.  Etiology undetermined.  Not biliary.  Not alcohol.  Not triglycerides. 2.  End-stage renal disease.  Recently started dialysis as inpatient. 3.  Multiple medical problems  PLAN:  1.  Low-fat diet 2.  Continue with inpatient dialysis as planned 3.  Okay to discharge home from GI perspective if tolerating low-fat diet and not requiring significant pain medication. 4.  We will get him outpatient GI follow-up regarding further work-up of his pancreatitis.  In 6 to 8 weeks after recovering from this bout of pancreatitis he would benefit from advanced imaging of the pancreas to rule out underlying occult lesion.  His primary GI doctor is Dr. [name].  I discussed this with the patient. We will sign off.  Docia Chuck. Geri Seminole., M.D. [facility] Division of Gastroenterology

## 2020-08-29 ENCOUNTER — Telehealth: Payer: Self-pay | Admitting: Nurse Practitioner

## 2020-08-29 NOTE — Telephone Encounter (Signed)
Transition of care contact from inpatient facility  Date of Discharge: 08/28/2020 Date of Contact: 08/29/2020 Method of contact: Phone  Attempted to contact patient to discuss transition of care from inpatient admission. Patient did not answer the phone. Message was left on the patient's voicemail with call back number [phone].

## 2020-08-29 NOTE — Telephone Encounter (Signed)
Left message for patient to call back  

## 2020-08-30 ENCOUNTER — Telehealth: Payer: Self-pay

## 2020-08-30 ENCOUNTER — Other Ambulatory Visit: Payer: Self-pay | Admitting: *Deleted

## 2020-08-30 DIAGNOSIS — N2581 Secondary hyperparathyroidism of renal origin: Secondary | ICD-10-CM | POA: Diagnosis not present

## 2020-08-30 DIAGNOSIS — K859 Acute pancreatitis without necrosis or infection, unspecified: Secondary | ICD-10-CM | POA: Diagnosis not present

## 2020-08-30 DIAGNOSIS — N185 Chronic kidney disease, stage 5: Secondary | ICD-10-CM

## 2020-08-30 DIAGNOSIS — Z992 Dependence on renal dialysis: Secondary | ICD-10-CM | POA: Diagnosis not present

## 2020-08-30 DIAGNOSIS — N186 End stage renal disease: Secondary | ICD-10-CM | POA: Diagnosis not present

## 2020-08-30 DIAGNOSIS — I120 Hypertensive chronic kidney disease with stage 5 chronic kidney disease or end stage renal disease: Secondary | ICD-10-CM | POA: Diagnosis not present

## 2020-08-30 NOTE — Telephone Encounter (Signed)
Transition Care Management Follow-up Telephone Call Date of discharge and from where: 08/28/2020 from [facility] How have you been since you were released from the hospital? Doing good. Any questions or concerns? No  Items Reviewed: Did the pt receive and understand the discharge instructions provided? Yes  Medications obtained and verified? Yes  Other? No  Any new allergies since your discharge? No  Dietary orders reviewed? Yes, Renal with fluid restriction and low fat, eat frequent meals Do you have support at home? Yes   Home Care and Equipment/Supplies: Were home health services ordered? no If so, what is the name of the agency? N/a  Has the agency set up a time to come to the patient's home? not applicable Were any new equipment or medical supplies ordered?  No What is the name of the medical supply agency? N/a Were you able to get the supplies/equipment? not applicable Do you have any questions related to the use of the equipment or supplies? No  Functional Questionnaire: (I = Independent and D = Dependent) ADLs: I  Bathing/Dressing- I  Meal Prep- I  Eating- I  Maintaining continence- I  Transferring/Ambulation- I  Managing Meds- I  Follow up appointments reviewed:  PCP Hospital f/u appt confirmed? Yes  Scheduled to see Cathlean Cower, MD on 09/04/2020 @ 8:20 am. [facility] f/u appt confirmed? Yes  with [facility], [facility] and [facility] Are transportation arrangements needed? No  If their condition worsens, is the pt aware to call PCP or go to the Emergency Dept.? Yes Was the patient provided with contact information for the PCP's office or ED? Yes Was to pt encouraged to call back with questions or concerns? Yes

## 2020-09-02 DIAGNOSIS — N186 End stage renal disease: Secondary | ICD-10-CM | POA: Diagnosis not present

## 2020-09-02 DIAGNOSIS — Z992 Dependence on renal dialysis: Secondary | ICD-10-CM | POA: Diagnosis not present

## 2020-09-02 DIAGNOSIS — N2581 Secondary hyperparathyroidism of renal origin: Secondary | ICD-10-CM | POA: Diagnosis not present

## 2020-09-04 ENCOUNTER — Encounter: Payer: Self-pay | Admitting: Internal Medicine

## 2020-09-04 ENCOUNTER — Ambulatory Visit (INDEPENDENT_AMBULATORY_CARE_PROVIDER_SITE_OTHER): Payer: Medicare HMO | Admitting: Internal Medicine

## 2020-09-04 ENCOUNTER — Other Ambulatory Visit: Payer: Self-pay

## 2020-09-04 VITALS — BP 120/55 | HR 85 | Temp 98.2°F | Ht 74.0 in | Wt 261.8 lb

## 2020-09-04 DIAGNOSIS — E1122 Type 2 diabetes mellitus with diabetic chronic kidney disease: Secondary | ICD-10-CM | POA: Diagnosis not present

## 2020-09-04 DIAGNOSIS — K8500 Idiopathic acute pancreatitis without necrosis or infection: Secondary | ICD-10-CM

## 2020-09-04 DIAGNOSIS — I10 Essential (primary) hypertension: Secondary | ICD-10-CM | POA: Diagnosis not present

## 2020-09-04 DIAGNOSIS — N185 Chronic kidney disease, stage 5: Secondary | ICD-10-CM

## 2020-09-04 DIAGNOSIS — N186 End stage renal disease: Secondary | ICD-10-CM | POA: Diagnosis not present

## 2020-09-04 DIAGNOSIS — N2581 Secondary hyperparathyroidism of renal origin: Secondary | ICD-10-CM | POA: Diagnosis not present

## 2020-09-04 DIAGNOSIS — Z992 Dependence on renal dialysis: Secondary | ICD-10-CM | POA: Diagnosis not present

## 2020-09-04 NOTE — Progress Notes (Signed)
Patient ID: Craig Flynn, male   DOB: January 04, 1961, 60 y.o.   MRN: [id number]        Chief Complaint: post hospn TCM followup       HPI:  Craig Flynn is a 59 y.o. male here after hospn June 1 - August 28 2020 with probable idiopathic pancreatitis starting the day after a holiday dinner with family where he ate pigs feet, unusual for him for many years. Pt plans to call for appt with Dr [name] GI later today, to f/u plan for advanced pancreatic imaging..  Pt denies chest pain, increased sob or doe, wheezing, orthopnea, PND, increased LE swelling, palpitations, dizziness or syncope.  Denies worsening reflux, abd pain, dysphagia, n/v, bowel change or blood.  Also plans to f/u with vascular surgury as recommended, and to contine HD with temp dialysis catheter starting June 10 MWF.   Pt denies recent muscle cramping, and elevated calcium resolved with stop calcitriol and calcium carbonate.  Coreg restarted at hospn and tolerating well.  To start aranesp soon with renal at HD.  Denies hyper or hypo thyroid symptoms such as voice, skin or hair change.  Pt denies fever, wt loss, night sweats, loss of appetite, or other constitutional symptoms  No other new complaints       Wt Readings from Last 3 Encounters:  09/04/20 261 lb 12.8 oz (118.8 kg)  08/28/20 266 lb 12.1 oz (121 kg)  06/07/20 277 lb 3.2 oz (125.7 kg)   BP Readings from Last 3 Encounters:  09/04/20 (!) 120/55  08/28/20 (!) 151/62  07/31/20 (!) 152/83   Transitional Care Management elements noted today: 1)  Date of D/C: as above 2)  Medication reconciliation:  done today at end visit 3)  Review of D/C summary or other information:  done today 4)  Review of need for f/u on pending diagnostic tests and treatments:  done today 5)  Review of need for Interaction with other providers who will assume or resume care of pt specific problems: done today 6)  Education of patient/family/guardian or caregiver: not done as pt is here alone       Past  Medical History:  Diagnosis Date   Anemia 06/2015   Arthritis    knee   Cancer ([place])    thyroid cancer   Cardiomyopathy ([place])    a. 10/2013: EF reduced to 35-40% b. 09/2014: EF improved to 55-60%, Grade 1 DD noted.   Chest pain 06/2015   CHF (congestive heart failure) (HCC)    CKD (chronic kidney disease), stage IV [facility])    sees  Dr Lorrene Reid   Dyspnea    with exerion    GERD (gastroesophageal reflux disease)    Gout    Hypertension    Obesity    Sleep apnea    not wearing c-pap now, needs new slep study per pt. (01/22/2017)   Tuberculosis 1981   while the pt was in the service.   Tubular adenoma of colon 10/2015   Wears glasses    Past Surgical History:  Procedure Laterality Date   AV FISTULA PLACEMENT Left 03/25/2016   Procedure: Creation of Left Arm ARTERIOVENOUS (AV) FISTULA;  Surgeon: Elam Dutch, MD;  Location: [facility] OR;  Service: Vascular;  Laterality: Left;   AV FISTULA PLACEMENT Left 08/27/2020   Procedure: LEFT BASILIC VEIN ARTERIOVENOUS (AV) FISTULA CREATION;  Surgeon: Rosetta Posner, MD;  Location: [facility] OR;  Service: Vascular;  Laterality: Left;   COLONOSCOPY W/ BIOPSIES AND  POLYPECTOMY     "no problem"   HERNIA REPAIR     IR FLUORO GUIDE CV LINE RIGHT  08/22/2020   IR US GUIDE VASC ACCESS RIGHT  08/22/2020   LAPAROSCOPIC CHOLECYSTECTOMY     LIGATION OF COMPETING BRANCHES OF ARTERIOVENOUS FISTULA Left 01/25/2018   Procedure: LIGATION OF COMPETING BRANCHES And Revision of ARTERIOVENOUS FISTULA LEFT ARM.;  Surgeon: Elam Dutch, MD;  Location: [place];  Service: Vascular;  Laterality: Left;   THYROIDECTOMY  01/22/2017   THYROIDECTOMY Left 01/22/2017   Procedure: THYROIDECTOMY;  Surgeon: Melida Quitter, MD;  Location: [place];  Service: ENT;  Laterality: Left;   THYROIDECTOMY Right 03/26/2017   Procedure: COMPLETION OF THYROIDECTOMY;  Surgeon: Melida Quitter, MD;  Location: [place];  Service: ENT;  Laterality: Right;   UMBILICAL HERNIA REPAIR     WISDOM TOOTH EXTRACTION       reports that he has been smoking cigarettes. He has a 20.00 pack-year smoking history. He has never used smokeless tobacco. He reports previous alcohol use. He reports that he does not use drugs. family history includes Healthy in his maternal grandfather, paternal grandfather, and paternal grandmother; Hypertension in his maternal grandmother; Kidney disease in his maternal grandmother; Pneumonia in his father; Stroke in his mother. No Known Allergies Current Outpatient Medications on File Prior to Visit  Medication Sig Dispense Refill   acetaminophen (TYLENOL) 500 MG tablet Take 2 tablets (1,000 mg total) by mouth every 8 (eight) hours as needed for mild pain or headache. 60 tablet 0   albuterol (VENTOLIN HFA) 108 (90 Base) MCG/ACT inhaler INHALE 2 PUFFS INTO THE LUNGS EVERY 6 HOURS AS NEEDED FOR WHEEZING OR SHORTNESS OF BREATH (Patient taking differently: Inhale 2 puffs into the lungs every 6 (six) hours as needed for wheezing or shortness of breath.) 18 g 5   allopurinol (ZYLOPRIM) 100 MG tablet Take 150 mg by mouth daily. Taking 1 & 1/2 tab= 150 mg     aspirin EC 81 MG tablet Take 81 mg by mouth daily.     carvedilol (COREG) 25 MG tablet TAKE 1 TABLET(25 MG) BY MOUTH TWICE DAILY (Patient taking differently: Take 25 mg by mouth 2 (two) times daily with a meal.) 180 tablet 2   cyclobenzaprine (FLEXERIL) 10 MG tablet Take 1 tablet (10 mg total) by mouth 2 (two) times daily as needed for muscle spasms. 20 tablet 0   fenofibrate 54 MG tablet TAKE 1 TABLET BY MOUTH EVERY DAY (Patient taking differently: Take 54 mg by mouth daily.) 60 tablet 0   iron polysaccharides (NIFEREX) 150 MG capsule Take 1 capsule (150 mg total) by mouth daily. 30 capsule 0   isosorbide mononitrate (IMDUR) 30 MG 24 hr tablet TAKE 2 TABLETS(60 MG) BY MOUTH DAILY (Patient taking differently: Take 60 mg by mouth daily. TAKE 2 TABLETS(60 MG) BY MOUTH DAILY) 90 tablet 3   levothyroxine (SYNTHROID) 137 MCG tablet Take 137 mcg by mouth  every morning.     nitroGLYCERIN (NITROSTAT) 0.4 MG SL tablet DISSOLVE 1 TABLET UNDER THE TONGUE EVERY 5 MINUTES AS NEEDED FOR CHEST PAIN (Patient taking differently: Place 0.4 mg under the tongue every 5 (five) minutes as needed for chest pain.) 25 tablet 3   pantoprazole (PROTONIX) 40 MG tablet Take 1 tablet (40 mg total) by mouth 2 (two) times daily before a meal. 180 tablet 3   polyethylene glycol (MIRALAX / GLYCOLAX) 17 g packet Take 17 g by mouth 2 (two) times daily. 14 each 0  senna-docusate (SENOKOT-S) 8.6-50 MG tablet Take 2 tablets by mouth 2 (two) times daily. 60 tablet 0   thiamine (VITAMIN B-1) 50 MG tablet Take 1 tablet (50 mg total) by mouth daily. 90 tablet 1   No current facility-administered medications on file prior to visit.        ROS:  All others reviewed and negative.  Objective        PE:  BP (!) 120/55 (BP Location: Left Arm, Patient Position: Sitting, Cuff Size: Large)   Pulse 85   Temp 98.2 F (36.8 C) (Oral)   Ht 6\' 2"  (1.88 m)   Wt 261 lb 12.8 oz (118.8 kg)   SpO2 99%   BMI 33.61 kg/m                 Constitutional: Pt appears in NAD               HENT: Head: NCAT.                Right Ear: External ear normal.                 Left Ear: External ear normal.                Eyes: . Pupils are equal, round, and reactive to light. Conjunctivae and EOM are normal               Nose: without d/c or deformity               Neck: Neck supple. Gross normal ROM               Cardiovascular: Normal rate and regular rhythm.                 Pulmonary/Chest: Effort normal and breath sounds without rales or wheezing.                Abd:  Soft, NT, ND, + BS, no organomegaly               Neurological: Pt is alert. At baseline orientation, motor grossly intact               Skin: Skin is warm. No rashes, no other new lesions, LE edema - none               Psychiatric: Pt behavior is normal without agitation   Micro: none  Cardiac tracings I have personally  interpreted today:  none  Pertinent Radiological findings (summarize): none   Lab Results  Component Value Date   WBC 5.8 08/28/2020   HGB 7.6 (L) 08/28/2020   HCT 24.4 (L) 08/28/2020   PLT 169 08/28/2020   GLUCOSE 86 08/28/2020   CHOL 132 06/07/2020   TRIG 162 (H) 08/21/2020   HDL 30.40 (L) 06/07/2020   LDLCALC 72 06/07/2020   ALT 10 08/26/2020   AST 17 08/26/2020   NA 132 (L) 08/28/2020   K 3.8 08/28/2020   CL 96 (L) 08/28/2020   CREATININE 6.94 (H) 08/28/2020   BUN 46 (H) 08/28/2020   CO2 25 08/28/2020   TSH 0.50 06/07/2020   PSA 0.67 06/07/2020   HGBA1C 5.5 06/07/2020   Assessment/Plan:  Craig Flynn is a 60 y.o. Black or African American [2] male with  has a past medical history of Anemia (06/2015), Arthritis, Cancer ([place]), Cardiomyopathy ([place]), Chest pain (06/2015), CHF (congestive heart failure) ([place]), CKD (chronic kidney disease), stage IV ([place]), Dyspnea, GERD (  gastroesophageal reflux disease), Gout, Hypertension, Obesity, Sleep apnea, Tuberculosis (1981), Tubular adenoma of colon (10/2015), and Wears glasses.  Acute pancreatitis Symptomatically resolved, overall doing well, pt to call GI for appt,  to f/u any worsening symptoms or concerns  HTN (hypertension) BP Readings from Last 3 Encounters:  09/04/20 (!) 120/55  08/28/20 (!) 151/62  07/31/20 (!) 152/83   Stable, pt to continue medical treatment  - coreg   Diabetes mellitus ([place]) Lab Results  Component Value Date   HGBA1C 5.5 06/07/2020   Stable, pt to continue current medical treatment  - diet   CKD (chronic kidney disease), stage V ([place])  Now end stage, to f/u vascular and renal as planned, with HD to start 6/10  Followup: Return in about 6 months (around 03/06/2021).  Cathlean Cower, MD 09/08/2020 3:17 PM [facility]

## 2020-09-04 NOTE — Patient Instructions (Signed)
Please continue all other medications as before, and refills have been done if requested.  Please have the pharmacy call with any other refills you may need.  Please continue your efforts at being more active, low cholesterol diet, and weight control.  Please keep your appointments with your specialists as you may have planned  Please make an Appointment to return in 6 months, or sooner if needed 

## 2020-09-05 NOTE — Telephone Encounter (Signed)
Spoke with patient, appt made and patient verbalized understanding for 7/19 @8 :30am. Nothing further needed at this time.

## 2020-09-06 DIAGNOSIS — N2581 Secondary hyperparathyroidism of renal origin: Secondary | ICD-10-CM | POA: Diagnosis not present

## 2020-09-06 DIAGNOSIS — N186 End stage renal disease: Secondary | ICD-10-CM | POA: Diagnosis not present

## 2020-09-06 DIAGNOSIS — Z992 Dependence on renal dialysis: Secondary | ICD-10-CM | POA: Diagnosis not present

## 2020-09-08 ENCOUNTER — Encounter: Payer: Self-pay | Admitting: Internal Medicine

## 2020-09-08 NOTE — Assessment & Plan Note (Signed)
Lab Results  ?Component Value Date  ? HGBA1C 5.5 06/07/2020  ? ?Stable, pt to continue current medical treatment  - diet ? ?

## 2020-09-08 NOTE — Assessment & Plan Note (Signed)
Now end stage, to f/u vascular and renal as planned, with HD to start 6/10

## 2020-09-08 NOTE — Assessment & Plan Note (Signed)
BP Readings from Last 3 Encounters:  09/04/20 (!) 120/55  08/28/20 (!) 151/62  07/31/20 (!) 152/83   Stable, pt to continue medical treatment  - coreg

## 2020-09-08 NOTE — Assessment & Plan Note (Signed)
Symptomatically resolved, overall doing well, pt to call GI for appt,  to f/u any worsening symptoms or concerns

## 2020-09-09 DIAGNOSIS — Z992 Dependence on renal dialysis: Secondary | ICD-10-CM | POA: Diagnosis not present

## 2020-09-09 DIAGNOSIS — N186 End stage renal disease: Secondary | ICD-10-CM | POA: Diagnosis not present

## 2020-09-09 DIAGNOSIS — N2581 Secondary hyperparathyroidism of renal origin: Secondary | ICD-10-CM | POA: Diagnosis not present

## 2020-09-11 DIAGNOSIS — Z992 Dependence on renal dialysis: Secondary | ICD-10-CM | POA: Diagnosis not present

## 2020-09-11 DIAGNOSIS — N2581 Secondary hyperparathyroidism of renal origin: Secondary | ICD-10-CM | POA: Diagnosis not present

## 2020-09-11 DIAGNOSIS — N186 End stage renal disease: Secondary | ICD-10-CM | POA: Diagnosis not present

## 2020-09-13 DIAGNOSIS — N2581 Secondary hyperparathyroidism of renal origin: Secondary | ICD-10-CM | POA: Diagnosis not present

## 2020-09-13 DIAGNOSIS — Z992 Dependence on renal dialysis: Secondary | ICD-10-CM | POA: Diagnosis not present

## 2020-09-13 DIAGNOSIS — N186 End stage renal disease: Secondary | ICD-10-CM | POA: Diagnosis not present

## 2020-09-16 DIAGNOSIS — N186 End stage renal disease: Secondary | ICD-10-CM | POA: Diagnosis not present

## 2020-09-16 DIAGNOSIS — N2581 Secondary hyperparathyroidism of renal origin: Secondary | ICD-10-CM | POA: Diagnosis not present

## 2020-09-16 DIAGNOSIS — Z992 Dependence on renal dialysis: Secondary | ICD-10-CM | POA: Diagnosis not present

## 2020-09-18 DIAGNOSIS — N2581 Secondary hyperparathyroidism of renal origin: Secondary | ICD-10-CM | POA: Diagnosis not present

## 2020-09-18 DIAGNOSIS — N186 End stage renal disease: Secondary | ICD-10-CM | POA: Diagnosis not present

## 2020-09-18 DIAGNOSIS — Z992 Dependence on renal dialysis: Secondary | ICD-10-CM | POA: Diagnosis not present

## 2020-09-19 DIAGNOSIS — I129 Hypertensive chronic kidney disease with stage 1 through stage 4 chronic kidney disease, or unspecified chronic kidney disease: Secondary | ICD-10-CM | POA: Diagnosis not present

## 2020-09-19 DIAGNOSIS — N186 End stage renal disease: Secondary | ICD-10-CM | POA: Diagnosis not present

## 2020-09-19 DIAGNOSIS — Z992 Dependence on renal dialysis: Secondary | ICD-10-CM | POA: Diagnosis not present

## 2020-09-20 DIAGNOSIS — Z992 Dependence on renal dialysis: Secondary | ICD-10-CM | POA: Diagnosis not present

## 2020-09-20 DIAGNOSIS — N186 End stage renal disease: Secondary | ICD-10-CM | POA: Diagnosis not present

## 2020-09-20 DIAGNOSIS — N2581 Secondary hyperparathyroidism of renal origin: Secondary | ICD-10-CM | POA: Diagnosis not present

## 2020-09-23 DIAGNOSIS — N2581 Secondary hyperparathyroidism of renal origin: Secondary | ICD-10-CM | POA: Diagnosis not present

## 2020-09-23 DIAGNOSIS — N186 End stage renal disease: Secondary | ICD-10-CM | POA: Diagnosis not present

## 2020-09-23 DIAGNOSIS — Z992 Dependence on renal dialysis: Secondary | ICD-10-CM | POA: Diagnosis not present

## 2020-09-25 DIAGNOSIS — N186 End stage renal disease: Secondary | ICD-10-CM | POA: Diagnosis not present

## 2020-09-25 DIAGNOSIS — Z992 Dependence on renal dialysis: Secondary | ICD-10-CM | POA: Diagnosis not present

## 2020-09-25 DIAGNOSIS — N2581 Secondary hyperparathyroidism of renal origin: Secondary | ICD-10-CM | POA: Diagnosis not present

## 2020-09-27 DIAGNOSIS — N186 End stage renal disease: Secondary | ICD-10-CM | POA: Diagnosis not present

## 2020-09-27 DIAGNOSIS — Z992 Dependence on renal dialysis: Secondary | ICD-10-CM | POA: Diagnosis not present

## 2020-09-27 DIAGNOSIS — N2581 Secondary hyperparathyroidism of renal origin: Secondary | ICD-10-CM | POA: Diagnosis not present

## 2020-09-30 DIAGNOSIS — N186 End stage renal disease: Secondary | ICD-10-CM | POA: Diagnosis not present

## 2020-09-30 DIAGNOSIS — N2581 Secondary hyperparathyroidism of renal origin: Secondary | ICD-10-CM | POA: Diagnosis not present

## 2020-09-30 DIAGNOSIS — Z992 Dependence on renal dialysis: Secondary | ICD-10-CM | POA: Diagnosis not present

## 2020-10-02 DIAGNOSIS — N2581 Secondary hyperparathyroidism of renal origin: Secondary | ICD-10-CM | POA: Diagnosis not present

## 2020-10-02 DIAGNOSIS — Z992 Dependence on renal dialysis: Secondary | ICD-10-CM | POA: Diagnosis not present

## 2020-10-02 DIAGNOSIS — N186 End stage renal disease: Secondary | ICD-10-CM | POA: Diagnosis not present

## 2020-10-04 DIAGNOSIS — N186 End stage renal disease: Secondary | ICD-10-CM | POA: Diagnosis not present

## 2020-10-04 DIAGNOSIS — N2581 Secondary hyperparathyroidism of renal origin: Secondary | ICD-10-CM | POA: Diagnosis not present

## 2020-10-04 DIAGNOSIS — Z992 Dependence on renal dialysis: Secondary | ICD-10-CM | POA: Diagnosis not present

## 2020-10-07 DIAGNOSIS — N2581 Secondary hyperparathyroidism of renal origin: Secondary | ICD-10-CM | POA: Diagnosis not present

## 2020-10-07 DIAGNOSIS — N186 End stage renal disease: Secondary | ICD-10-CM | POA: Diagnosis not present

## 2020-10-07 DIAGNOSIS — E441 Mild protein-calorie malnutrition: Secondary | ICD-10-CM | POA: Insufficient documentation

## 2020-10-07 DIAGNOSIS — Z992 Dependence on renal dialysis: Secondary | ICD-10-CM | POA: Diagnosis not present

## 2020-10-08 ENCOUNTER — Ambulatory Visit: Payer: Medicare HMO | Admitting: Gastroenterology

## 2020-10-09 ENCOUNTER — Telehealth: Payer: Self-pay

## 2020-10-09 DIAGNOSIS — N186 End stage renal disease: Secondary | ICD-10-CM | POA: Diagnosis not present

## 2020-10-09 DIAGNOSIS — N2581 Secondary hyperparathyroidism of renal origin: Secondary | ICD-10-CM | POA: Diagnosis not present

## 2020-10-09 DIAGNOSIS — Z992 Dependence on renal dialysis: Secondary | ICD-10-CM | POA: Diagnosis not present

## 2020-10-09 NOTE — Telephone Encounter (Signed)
Patient calls today to report some right sided pain up into his earlobe where his catheter is. IR placed it in June. Patient has follow up with [facility] tomorrow. Advised him to show provider.

## 2020-10-10 ENCOUNTER — Other Ambulatory Visit: Payer: Self-pay

## 2020-10-10 ENCOUNTER — Ambulatory Visit (HOSPITAL_COMMUNITY)
Admission: RE | Admit: 2020-10-10 | Discharge: 2020-10-10 | Disposition: A | Discharge status: Home / Self Care | Payer: Medicare HMO | Source: Ambulatory Visit | Attending: Vascular Surgery | Admitting: Vascular Surgery

## 2020-10-10 ENCOUNTER — Ambulatory Visit (INDEPENDENT_AMBULATORY_CARE_PROVIDER_SITE_OTHER): Payer: Medicare HMO | Admitting: Physician Assistant

## 2020-10-10 VITALS — BP 123/68 | HR 75 | Temp 97.9°F | Resp 20 | Ht 74.0 in | Wt 254.8 lb

## 2020-10-10 DIAGNOSIS — N185 Chronic kidney disease, stage 5: Secondary | ICD-10-CM

## 2020-10-10 DIAGNOSIS — Z992 Dependence on renal dialysis: Secondary | ICD-10-CM

## 2020-10-10 DIAGNOSIS — N186 End stage renal disease: Secondary | ICD-10-CM

## 2020-10-10 NOTE — H&P (View-Only) (Signed)
Postoperative Access Visit   History of Present Illness   Craig Flynn is a 60 y.o. year old male who presents for postoperative follow-up for: left basilic vein fistula 10/28/48 by Dr. Donnetta Hutching. The patient's wounds are well healed.  The patient notes no steal symptoms.  The patient is  able to complete their activities of daily living.    He is currently dialyzing via a right IJ TDC on MWF at the [facility] location  Physical Examination   Vitals:   10/10/20 1120  BP: 123/68  Pulse: 75  Resp: 20  Temp: 97.9 F (36.6 C)  TempSrc: Temporal  SpO2: 97%  Weight: 254 lb 12.8 oz (115.6 kg)  Height: 6\' 2"  (1.88 m)   Body mass index is 32.71 kg/m.  left arm Incision is WELL healed, 1+ radial pulse, hand grip is 5/5, sensation in digits is intact, palpable thrill, bruit can  be auscultated. Fistula is deep in left upper arm    Non invasive vascular lab study: 10/10/20 Findings:  +--------------------+----------+-----------------+--------+  AVF                 PSV (cm/s)Flow Vol (mL/min)Comments  +--------------------+----------+-----------------+--------+  Native artery inflow   271          1587                 +--------------------+----------+-----------------+--------+  AVF Anastomosis        566                               +--------------------+----------+-----------------+--------+      +------------+----------+-------------+----------+----------------+  OUTFLOW VEINPSV (cm/s)Diameter (cm)Depth (cm)    Describe      +------------+----------+-------------+----------+----------------+  Shoulder        79        0.85        2.10                     +------------+----------+-------------+----------+----------------+  Prox UA        149        0.81        1.84                     +------------+----------+-------------+----------+----------------+  Mid UA         250        0.77        1.06   competing branch   +------------+----------+-------------+----------+----------------+  Dist UA        269        0.65        0.71                     +------------+----------+-------------+----------+----------------+  AC Fossa       586        0.45        0.36                     +------------+----------+-------------+----------+----------------+       Medical Decision Making   Craig Flynn is a 60 y.o. year old male who presents s/p left basilic vein fistula 04/29/72 by Dr. Donnetta Hutching. His incision healed nicely. He is not having any steal symptoms. His duplex today shows that the fistula has matured nicely it is however deep in the left upper arm. I have recommended a 2nd stage Basilic vein transposition. I will  schedule him for this in the near future with Dr. Oneida Alar. He dialyzes on MWF so will try to arrange this on a non dialysis day.  Karoline Caldwell, PA-C [facility] [place] Office: [phone]  Clinic MD: [place]/ [place]

## 2020-10-10 NOTE — Progress Notes (Signed)
Postoperative Access Visit   History of Present Illness   Craig Flynn is a 59 y.o. year old male who presents for postoperative follow-up for: left basilic vein fistula 04/28/69 by Craig Flynn. The patient's wounds are well healed.  The patient notes no steal symptoms.  The patient is  able to complete their activities of daily living.    He is currently dialyzing via a right IJ TDC on MWF at the [facility] location  Physical Examination   Vitals:   10/10/20 1120  BP: 123/68  Pulse: 75  Resp: 20  Temp: 97.9 F (36.6 C)  TempSrc: Temporal  SpO2: 97%  Weight: 254 lb 12.8 oz (115.6 kg)  Height: 6\' 2"  (1.88 m)   Body mass index is 32.71 kg/m.  left arm Incision is WELL healed, 1+ radial pulse, hand grip is 5/5, sensation in digits is intact, palpable thrill, bruit can  be auscultated. Fistula is deep in left upper arm    Non invasive vascular lab study: 10/10/20 Findings:  +--------------------+----------+-----------------+--------+  AVF                 PSV (cm/s)Flow Vol (mL/min)Comments  +--------------------+----------+-----------------+--------+  Native artery inflow   271          1587                 +--------------------+----------+-----------------+--------+  AVF Anastomosis        566                               +--------------------+----------+-----------------+--------+      +------------+----------+-------------+----------+----------------+  OUTFLOW VEINPSV (cm/s)Diameter (cm)Depth (cm)    Describe      +------------+----------+-------------+----------+----------------+  Shoulder        79        0.85        2.10                     +------------+----------+-------------+----------+----------------+  Prox UA        149        0.81        1.84                     +------------+----------+-------------+----------+----------------+  Mid UA         250        0.77        1.06   competing branch   +------------+----------+-------------+----------+----------------+  Dist UA        269        0.65        0.71                     +------------+----------+-------------+----------+----------------+  AC Fossa       586        0.45        0.36                     +------------+----------+-------------+----------+----------------+       Medical Decision Making   Craig Flynn is a 60 y.o. year old male who presents s/p left basilic vein fistula 04/26/56 by Craig Flynn. His incision healed nicely. He is not having any steal symptoms. His duplex today shows that the fistula has matured nicely it is however deep in the left upper arm. I have recommended a 2nd stage Basilic vein transposition. I will  schedule him for this in the near future with Dr. Oneida Alar. He dialyzes on MWF so will try to arrange this on a non dialysis day.  Karoline Caldwell, PA-C [facility] Office: [phone]  Clinic MD: D[place]/ Fields

## 2020-10-11 DIAGNOSIS — N2581 Secondary hyperparathyroidism of renal origin: Secondary | ICD-10-CM | POA: Diagnosis not present

## 2020-10-11 DIAGNOSIS — Z992 Dependence on renal dialysis: Secondary | ICD-10-CM | POA: Diagnosis not present

## 2020-10-11 DIAGNOSIS — N186 End stage renal disease: Secondary | ICD-10-CM | POA: Diagnosis not present

## 2020-10-14 DIAGNOSIS — N2581 Secondary hyperparathyroidism of renal origin: Secondary | ICD-10-CM | POA: Diagnosis not present

## 2020-10-14 DIAGNOSIS — Z992 Dependence on renal dialysis: Secondary | ICD-10-CM | POA: Diagnosis not present

## 2020-10-14 DIAGNOSIS — N186 End stage renal disease: Secondary | ICD-10-CM | POA: Diagnosis not present

## 2020-10-16 DIAGNOSIS — N2581 Secondary hyperparathyroidism of renal origin: Secondary | ICD-10-CM | POA: Diagnosis not present

## 2020-10-16 DIAGNOSIS — N186 End stage renal disease: Secondary | ICD-10-CM | POA: Diagnosis not present

## 2020-10-16 DIAGNOSIS — Z992 Dependence on renal dialysis: Secondary | ICD-10-CM | POA: Diagnosis not present

## 2020-10-18 ENCOUNTER — Encounter (HOSPITAL_COMMUNITY): Payer: Self-pay | Admitting: Vascular Surgery

## 2020-10-18 ENCOUNTER — Other Ambulatory Visit: Payer: Self-pay

## 2020-10-18 DIAGNOSIS — N2581 Secondary hyperparathyroidism of renal origin: Secondary | ICD-10-CM | POA: Diagnosis not present

## 2020-10-18 DIAGNOSIS — N186 End stage renal disease: Secondary | ICD-10-CM | POA: Diagnosis not present

## 2020-10-18 DIAGNOSIS — Z992 Dependence on renal dialysis: Secondary | ICD-10-CM | POA: Diagnosis not present

## 2020-10-18 NOTE — Progress Notes (Signed)
Spoke with pt for pre-op call. Pt denies cardiac history or diabetes. Pt is treated for HTN and CKD.  Pt's surgery is scheduled as ambulatory so no Covid test is required prior to surgery. Pt denies any Covid symptoms.

## 2020-10-20 DIAGNOSIS — I129 Hypertensive chronic kidney disease with stage 1 through stage 4 chronic kidney disease, or unspecified chronic kidney disease: Secondary | ICD-10-CM | POA: Diagnosis not present

## 2020-10-20 DIAGNOSIS — Z992 Dependence on renal dialysis: Secondary | ICD-10-CM | POA: Diagnosis not present

## 2020-10-20 DIAGNOSIS — N186 End stage renal disease: Secondary | ICD-10-CM | POA: Diagnosis not present

## 2020-10-21 DIAGNOSIS — N186 End stage renal disease: Secondary | ICD-10-CM | POA: Diagnosis not present

## 2020-10-21 DIAGNOSIS — N2581 Secondary hyperparathyroidism of renal origin: Secondary | ICD-10-CM | POA: Diagnosis not present

## 2020-10-21 DIAGNOSIS — Z992 Dependence on renal dialysis: Secondary | ICD-10-CM | POA: Diagnosis not present

## 2020-10-22 ENCOUNTER — Ambulatory Visit (HOSPITAL_COMMUNITY): Payer: Medicare HMO | Admitting: Physician Assistant

## 2020-10-22 ENCOUNTER — Encounter (HOSPITAL_COMMUNITY)
Admission: RE | Disposition: A | Discharge status: Home / Self Care | Payer: Self-pay | Source: Home / Self Care | Attending: Vascular Surgery

## 2020-10-22 ENCOUNTER — Encounter (HOSPITAL_COMMUNITY): Payer: Self-pay | Admitting: Vascular Surgery

## 2020-10-22 ENCOUNTER — Ambulatory Visit (HOSPITAL_COMMUNITY)
Admission: RE | Admit: 2020-10-22 | Discharge: 2020-10-22 | Disposition: A | Discharge status: Home / Self Care | Payer: Medicare HMO | Attending: Vascular Surgery | Admitting: Vascular Surgery

## 2020-10-22 ENCOUNTER — Other Ambulatory Visit: Payer: Self-pay

## 2020-10-22 DIAGNOSIS — I509 Heart failure, unspecified: Secondary | ICD-10-CM | POA: Insufficient documentation

## 2020-10-22 DIAGNOSIS — E1122 Type 2 diabetes mellitus with diabetic chronic kidney disease: Secondary | ICD-10-CM | POA: Diagnosis not present

## 2020-10-22 DIAGNOSIS — Z992 Dependence on renal dialysis: Secondary | ICD-10-CM | POA: Diagnosis not present

## 2020-10-22 DIAGNOSIS — N179 Acute kidney failure, unspecified: Secondary | ICD-10-CM | POA: Diagnosis not present

## 2020-10-22 DIAGNOSIS — I132 Hypertensive heart and chronic kidney disease with heart failure and with stage 5 chronic kidney disease, or end stage renal disease: Secondary | ICD-10-CM | POA: Diagnosis not present

## 2020-10-22 DIAGNOSIS — F17200 Nicotine dependence, unspecified, uncomplicated: Secondary | ICD-10-CM | POA: Diagnosis not present

## 2020-10-22 DIAGNOSIS — N186 End stage renal disease: Secondary | ICD-10-CM | POA: Insufficient documentation

## 2020-10-22 DIAGNOSIS — N185 Chronic kidney disease, stage 5: Secondary | ICD-10-CM

## 2020-10-22 DIAGNOSIS — I5043 Acute on chronic combined systolic (congestive) and diastolic (congestive) heart failure: Secondary | ICD-10-CM | POA: Diagnosis not present

## 2020-10-22 HISTORY — PX: BASCILIC VEIN TRANSPOSITION: SHX5742

## 2020-10-22 LAB — POCT I-STAT, CHEM 8
BUN: 19 mg/dL (ref 6–20)
Calcium, Ion: 1.21 mmol/L (ref 1.15–1.40)
Chloride: 95 mmol/L — ABNORMAL LOW (ref 98–111)
Creatinine, Ser: 4.8 mg/dL — ABNORMAL HIGH (ref 0.61–1.24)
Glucose, Bld: 90 mg/dL (ref 70–99)
HCT: 29 % — ABNORMAL LOW (ref 39.0–52.0)
Hemoglobin: 9.9 g/dL — ABNORMAL LOW (ref 13.0–17.0)
Potassium: 3 mmol/L — ABNORMAL LOW (ref 3.5–5.1)
Sodium: 140 mmol/L (ref 135–145)
TCO2: 35 mmol/L — ABNORMAL HIGH (ref 22–32)

## 2020-10-22 SURGERY — TRANSPOSITION, VEIN, BASILIC
Anesthesia: Monitor Anesthesia Care | Site: Arm Upper | Laterality: Left

## 2020-10-22 MED ORDER — FENTANYL CITRATE (PF) 100 MCG/2ML IJ SOLN
25.0000 ug | INTRAMUSCULAR | Status: DC | PRN
  Administered 2020-10-22: 50 ug via INTRAVENOUS

## 2020-10-22 MED ORDER — HYDROCODONE-ACETAMINOPHEN 5-325 MG PO TABS: 1.0000 | ORAL_TABLET | Freq: Four times a day (QID) | ORAL | 0 refills | Status: DC | PRN

## 2020-10-22 MED ORDER — CEFAZOLIN SODIUM-DEXTROSE 2-4 GM/100ML-% IV SOLN
2.0000 g | INTRAVENOUS | Status: AC
  Administered 2020-10-22: 2 g via INTRAVENOUS
  Filled 2020-10-22: qty 100

## 2020-10-22 MED ORDER — OXYCODONE HCL 5 MG/5ML PO SOLN: 5.0000 mg | Freq: Once | ORAL | Status: DC | PRN

## 2020-10-22 MED ORDER — LIDOCAINE 2% (20 MG/ML) 5 ML SYRINGE
INTRAMUSCULAR | Status: AC
  Filled 2020-10-22: qty 5

## 2020-10-22 MED ORDER — CHLORHEXIDINE GLUCONATE 4 % EX LIQD: 60.0000 mL | Freq: Once | CUTANEOUS | Status: DC

## 2020-10-22 MED ORDER — PROPOFOL 1000 MG/100ML IV EMUL
INTRAVENOUS | Status: AC
  Filled 2020-10-22: qty 100

## 2020-10-22 MED ORDER — SODIUM CHLORIDE 0.9 % IV SOLN: INTRAVENOUS | Status: DC

## 2020-10-22 MED ORDER — FENTANYL CITRATE (PF) 250 MCG/5ML IJ SOLN
INTRAMUSCULAR | Status: AC
  Filled 2020-10-22: qty 5

## 2020-10-22 MED ORDER — LIDOCAINE-EPINEPHRINE (PF) 1 %-1:200000 IJ SOLN
INTRAMUSCULAR | Status: AC
  Filled 2020-10-22: qty 30

## 2020-10-22 MED ORDER — ORAL CARE MOUTH RINSE: 15.0000 mL | Freq: Once | OROMUCOSAL | Status: AC

## 2020-10-22 MED ORDER — ONDANSETRON HCL 4 MG/2ML IJ SOLN
INTRAMUSCULAR | Status: DC | PRN
  Administered 2020-10-22: 4 mg via INTRAVENOUS

## 2020-10-22 MED ORDER — LIDOCAINE 2% (20 MG/ML) 5 ML SYRINGE
INTRAMUSCULAR | Status: DC | PRN
  Administered 2020-10-22: 40 mg via INTRAVENOUS

## 2020-10-22 MED ORDER — DEXAMETHASONE SODIUM PHOSPHATE 10 MG/ML IJ SOLN
INTRAMUSCULAR | Status: AC
  Filled 2020-10-22: qty 1

## 2020-10-22 MED ORDER — 0.9 % SODIUM CHLORIDE (POUR BTL) OPTIME
TOPICAL | Status: DC | PRN
  Administered 2020-10-22: 1000 mL

## 2020-10-22 MED ORDER — HEPARIN 6000 UNIT IRRIGATION SOLUTION
Status: AC
  Filled 2020-10-22: qty 500

## 2020-10-22 MED ORDER — HEPARIN SODIUM (PORCINE) 1000 UNIT/ML IJ SOLN
INTRAMUSCULAR | Status: DC | PRN
  Administered 2020-10-22: 10000 [IU] via INTRAVENOUS

## 2020-10-22 MED ORDER — HEPARIN 6000 UNIT IRRIGATION SOLUTION
Status: DC | PRN
  Administered 2020-10-22: 1

## 2020-10-22 MED ORDER — PROPOFOL 500 MG/50ML IV EMUL
INTRAVENOUS | Status: DC | PRN
  Administered 2020-10-22: 100 ug/kg/min via INTRAVENOUS
  Administered 2020-10-22: 75 ug/kg/min via INTRAVENOUS

## 2020-10-22 MED ORDER — PHENYLEPHRINE 40 MCG/ML (10ML) SYRINGE FOR IV PUSH (FOR BLOOD PRESSURE SUPPORT)
PREFILLED_SYRINGE | INTRAVENOUS | Status: DC | PRN
  Administered 2020-10-22: 40 ug via INTRAVENOUS
  Administered 2020-10-22: 80 ug via INTRAVENOUS
  Administered 2020-10-22: 40 ug via INTRAVENOUS
  Administered 2020-10-22: 80 ug via INTRAVENOUS

## 2020-10-22 MED ORDER — ONDANSETRON HCL 4 MG/2ML IJ SOLN
4.0000 mg | Freq: Four times a day (QID) | INTRAMUSCULAR | Status: DC | PRN
Start: 2020-10-22 — End: 2020-10-22

## 2020-10-22 MED ORDER — LIDOCAINE HCL (PF) 1 % IJ SOLN
INTRAMUSCULAR | Status: AC
  Filled 2020-10-22: qty 30

## 2020-10-22 MED ORDER — CHLORHEXIDINE GLUCONATE 0.12 % MT SOLN
15.0000 mL | Freq: Once | OROMUCOSAL | Status: AC
  Administered 2020-10-22: 15 mL via OROMUCOSAL
  Filled 2020-10-22: qty 15

## 2020-10-22 MED ORDER — FENTANYL CITRATE (PF) 100 MCG/2ML IJ SOLN
INTRAMUSCULAR | Status: AC
  Filled 2020-10-22: qty 2

## 2020-10-22 MED ORDER — ONDANSETRON HCL 4 MG/2ML IJ SOLN
INTRAMUSCULAR | Status: AC
  Filled 2020-10-22: qty 2

## 2020-10-22 MED ORDER — PHENYLEPHRINE HCL-NACL 10-0.9 MG/250ML-% IV SOLN
INTRAVENOUS | Status: DC | PRN
  Administered 2020-10-22: 40 ug/min via INTRAVENOUS

## 2020-10-22 MED ORDER — MIDAZOLAM HCL 5 MG/5ML IJ SOLN
INTRAMUSCULAR | Status: DC | PRN
  Administered 2020-10-22 (×2): 1 mg via INTRAVENOUS

## 2020-10-22 MED ORDER — PROPOFOL 10 MG/ML IV BOLUS: INTRAVENOUS | Status: DC | PRN

## 2020-10-22 MED ORDER — MIDAZOLAM HCL 2 MG/2ML IJ SOLN
INTRAMUSCULAR | Status: AC
  Filled 2020-10-22: qty 2

## 2020-10-22 MED ORDER — PROTAMINE SULFATE 10 MG/ML IV SOLN
INTRAVENOUS | Status: DC | PRN
  Administered 2020-10-22: 100 mg via INTRAVENOUS

## 2020-10-22 MED ORDER — FENTANYL CITRATE (PF) 100 MCG/2ML IJ SOLN
INTRAMUSCULAR | Status: DC | PRN
  Administered 2020-10-22: 25 ug via INTRAVENOUS
  Administered 2020-10-22: 50 ug via INTRAVENOUS

## 2020-10-22 MED ORDER — LIDOCAINE-EPINEPHRINE (PF) 1.5 %-1:200000 IJ SOLN
INTRAMUSCULAR | Status: DC | PRN
  Administered 2020-10-22: 25 mL via PERINEURAL

## 2020-10-22 MED ORDER — PROPOFOL 10 MG/ML IV BOLUS
INTRAVENOUS | Status: AC
  Filled 2020-10-22: qty 20

## 2020-10-22 MED ORDER — OXYCODONE HCL 5 MG PO TABS: 5.0000 mg | ORAL_TABLET | Freq: Once | ORAL | Status: DC | PRN

## 2020-10-22 SURGICAL SUPPLY — 43 items
ADH SKN CLS APL DERMABOND .7 (GAUZE/BANDAGES/DRESSINGS) ×1
AGENT HMST SPONGE THK3/8 (HEMOSTASIS)
ARMBAND PINK RESTRICT EXTREMIT (MISCELLANEOUS) ×2 IMPLANT
BAG COUNTER SPONGE SURGICOUNT (BAG) ×2 IMPLANT
BAG SPNG CNTER NS LX DISP (BAG) ×1
CANISTER SUCT 3000ML PPV (MISCELLANEOUS) ×2 IMPLANT
CANNULA VESSEL 3MM 2 BLNT TIP (CANNULA) ×2 IMPLANT
CLIP VESOCCLUDE MED 24/CT (CLIP) ×2 IMPLANT
CLIP VESOCCLUDE SM WIDE 24/CT (CLIP) ×2 IMPLANT
COVER PROBE W GEL 5X96 (DRAPES) ×1 IMPLANT
DERMABOND ADVANCED (GAUZE/BANDAGES/DRESSINGS) ×1
DERMABOND ADVANCED .7 DNX12 (GAUZE/BANDAGES/DRESSINGS) ×1 IMPLANT
DRAIN PENROSE 1/4X12 LTX STRL (WOUND CARE) ×1 IMPLANT
ELECT REM PT RETURN 9FT ADLT (ELECTROSURGICAL) ×2
ELECTRODE REM PT RTRN 9FT ADLT (ELECTROSURGICAL) ×1 IMPLANT
GAUZE 4X4 16PLY ~~LOC~~+RFID DBL (SPONGE) ×1 IMPLANT
GLOVE SURG ENC MOIS LTX SZ7.5 (GLOVE) ×2 IMPLANT
GLOVE SURG NEOP MICRO LF SZ6.5 (GLOVE) ×1 IMPLANT
GOWN STRL REUS W/ TWL LRG LVL3 (GOWN DISPOSABLE) ×3 IMPLANT
GOWN STRL REUS W/ TWL XL LVL3 (GOWN DISPOSABLE) IMPLANT
GOWN STRL REUS W/TWL LRG LVL3 (GOWN DISPOSABLE) ×4
GOWN STRL REUS W/TWL XL LVL3 (GOWN DISPOSABLE) ×6
HEMOSTAT SPONGE AVITENE ULTRA (HEMOSTASIS) IMPLANT
KIT BASIN OR (CUSTOM PROCEDURE TRAY) ×2 IMPLANT
KIT TURNOVER KIT B (KITS) ×2 IMPLANT
LOOP VESSEL MINI RED (MISCELLANEOUS) IMPLANT
NS IRRIG 1000ML POUR BTL (IV SOLUTION) ×2 IMPLANT
PACK CV ACCESS (CUSTOM PROCEDURE TRAY) ×2 IMPLANT
PAD ARMBOARD 7.5X6 YLW CONV (MISCELLANEOUS) ×4 IMPLANT
SPONGE T-LAP 18X18 ~~LOC~~+RFID (SPONGE) ×1 IMPLANT
SUT PROLENE 7 0 BV 1 (SUTURE) ×7 IMPLANT
SUT SILK 2 0 (SUTURE) ×2
SUT SILK 2 0 SH (SUTURE) ×2 IMPLANT
SUT SILK 2-0 18XBRD TIE 12 (SUTURE) IMPLANT
SUT SILK 3 0 (SUTURE) ×2
SUT SILK 3-0 18XBRD TIE 12 (SUTURE) IMPLANT
SUT VIC AB 3-0 SH 27 (SUTURE) ×8
SUT VIC AB 3-0 SH 27X BRD (SUTURE) ×1 IMPLANT
SUT VICRYL 4-0 PS2 18IN ABS (SUTURE) ×5 IMPLANT
TAPE UMBILICAL 1/8X30 (MISCELLANEOUS) ×1 IMPLANT
TOWEL GREEN STERILE (TOWEL DISPOSABLE) ×2 IMPLANT
UNDERPAD 30X36 HEAVY ABSORB (UNDERPADS AND DIAPERS) ×2 IMPLANT
WATER STERILE IRR 1000ML POUR (IV SOLUTION) ×2 IMPLANT

## 2020-10-22 NOTE — Discharge Instructions (Addendum)
° °  [facility] ° °Discharge Instructions ° °AV Fistula or Graft Surgery for Dialysis Access ° °Please refer to the following instructions for your post-procedure care. Your surgeon or physician assistant will discuss any changes with you. ° °Activity ° °You may drive the day following your surgery, if you are comfortable and no longer taking prescription pain medication. Resume full activity as the soreness in your incision resolves. ° °Bathing/Showering ° °You may shower after you go home. Keep your incision dry for 48 hours. Do not soak in a bathtub, hot tub, or swim until the incision heals completely. You may not shower if you have a hemodialysis catheter. ° °Incision Care ° °Clean your incision with mild soap and water after 48 hours. Pat the area dry with a clean towel. You do not need a bandage unless otherwise instructed. Do not apply any ointments or creams to your incision. You may have skin glue on your incision. Do not peel it off. It will come off on its own in about one week. Your arm may swell a bit after surgery. To reduce swelling use pillows to elevate your arm so it is above your heart. Your doctor will tell you if you need to lightly wrap your arm with an ACE bandage. ° °Diet ° °Resume your normal diet. There are not special food restrictions following this procedure. In order to heal from your surgery, it is CRITICAL to get adequate nutrition. Your body requires vitamins, minerals, and protein. Vegetables are the best source of vitamins and minerals. Vegetables also provide the perfect balance of protein. Processed food has little nutritional value, so try to avoid this. ° °Medications ° °Resume taking all of your medications. If your incision is causing pain, you may take over-the counter pain relievers such as acetaminophen (Tylenol). If you were prescribed a stronger pain medication, please be aware these medications can cause nausea and constipation. Prevent  nausea by taking the medication with a snack or meal. Avoid constipation by drinking plenty of fluids and eating foods with high amount of fiber, such as fruits, vegetables, and grains. Do not take Tylenol if you are taking prescription pain medications. ° ° ° ° °Follow up °Your surgeon may want to see you in the office following your access surgery. If so, this will be arranged at the time of your surgery. ° °Please call us immediately for any of the following conditions: ° °Increased pain, redness, drainage (pus) from your incision site °Fever of 101 degrees or higher °Severe or worsening pain at your incision site °Hand pain or numbness. ° °Reduce your risk of vascular disease: ° °Stop smoking. If you would like help, call [facility] at 1-800-QUIT-NOW (1-800-784-8669) or [place] at 336-586-4000 ° °Manage your cholesterol °Maintain a desired weight °Control your diabetes °Keep your blood pressure down ° °Dialysis ° °It will take several weeks to several months for your new dialysis access to be ready for use. Your surgeon will determine when it is OK to use it. Your nephrologist will continue to direct your dialysis. You can continue to use your Permcath until your new access is ready for use. ° °If you have any questions, please call the office at [phone]. ° °

## 2020-10-22 NOTE — Anesthesia Preprocedure Evaluation (Signed)
Anesthesia Evaluation  Patient identified by MRN, date of birth, ID band Patient awake    Reviewed: Allergy & Precautions, H&P , NPO status , Patient's Chart, lab work & pertinent test results  Airway Mallampati: II   Neck ROM: full    Dental   Pulmonary shortness of breath, sleep apnea , Current Smoker and Patient abstained from smoking.,    breath sounds clear to auscultation       Cardiovascular hypertension, +CHF   Rhythm:regular Rate:Normal     Neuro/Psych  Headaches,  Neuromuscular disease    GI/Hepatic GERD  ,  Endo/Other  diabetesHypothyroidism   Renal/GU ESRF and DialysisRenal disease     Musculoskeletal  (+) Arthritis ,   Abdominal   Peds  Hematology   Anesthesia Other Findings   Reproductive/Obstetrics                             Anesthesia Physical Anesthesia Plan  ASA: 3  Anesthesia Plan: MAC and Regional   Post-op Pain Management:    Induction: Intravenous  PONV Risk Score and Plan: 0 and Propofol infusion and Treatment may vary due to age or medical condition  Airway Management Planned: Simple Face Mask  Additional Equipment:   Intra-op Plan:   Post-operative Plan: Extubation in OR  Informed Consent: I have reviewed the patients History and Physical, chart, labs and discussed the procedure including the risks, benefits and alternatives for the proposed anesthesia with the patient or authorized representative who has indicated his/her understanding and acceptance.     Dental advisory given  Plan Discussed with: Anesthesiologist, Surgeon and CRNA  Anesthesia Plan Comments:         Anesthesia Quick Evaluation

## 2020-10-22 NOTE — Anesthesia Procedure Notes (Signed)
Anesthesia Regional Block: Supraclavicular block   Pre-Anesthetic Checklist: , timeout performed,  Correct Patient, Correct Site, Correct Laterality,  Correct Procedure, Correct Position, site marked,  Risks and benefits discussed,  Surgical consent,  Pre-op evaluation,  At surgeon's request and post-op pain management  Laterality: Left  Prep: chloraprep       Needles:  Injection technique: Single-shot  Needle Type: Echogenic Stimulator Needle     Needle Length: 5cm  Needle Gauge: 22     Additional Needles:   Procedures:, nerve stimulator,,,,,     Nerve Stimulator or Paresthesia:  Response: biceps flexion, 0.45 mA  Additional Responses:   Narrative:  Start time: 10/22/2020 7:10 AM End time: 10/22/2020 7:22 AM Injection made incrementally with aspirations every 5 mL.  Performed by: Personally  Anesthesiologist: Albertha Ghee, MD  Additional Notes: Functioning IV was confirmed and monitors were applied.  A 4mm 22ga Arrow echogenic stimulator needle was used. Sterile prep and drape,hand hygiene and sterile gloves were used.  Negative aspiration and negative test dose prior to incremental administration of local anesthetic. The patient tolerated the procedure well.  Ultrasound guidance: relevent anatomy identified, needle position confirmed, local anesthetic spread visualized around nerve(s), vascular puncture avoided.  Image printed for medical record.

## 2020-10-22 NOTE — Anesthesia Procedure Notes (Signed)
Procedure Name: MAC Date/Time: 10/22/2020 7:32 AM Performed by: Clearnce Sorrel, CRNA Pre-anesthesia Checklist: Patient identified, Emergency Drugs available, Suction available, Patient being monitored and Timeout performed Patient Re-evaluated:Patient Re-evaluated prior to induction Oxygen Delivery Method: Simple face mask Preoxygenation: Pre-oxygenation with 100% oxygen Induction Type: IV induction

## 2020-10-22 NOTE — Interval H&P Note (Signed)
History and Physical Interval Note:  10/22/2020 7:27 AM  Craig Flynn  has presented today for surgery, with the diagnosis of End Stage Renal Disease.  The various methods of treatment have been discussed with the patient and family. After consideration of risks, benefits and other options for treatment, the patient has consented to  Procedure(s): LEFT [place] (Left) as a surgical intervention.  The patient's history has been reviewed, patient examined, no change in status, stable for surgery.  I have reviewed the patient's chart and labs.  Questions were answered to the patient's satisfaction.     Ruta Hinds

## 2020-10-22 NOTE — Transfer of Care (Signed)
Immediate Anesthesia Transfer of Care Note  Patient: Craig Flynn  Procedure(s) Performed: LEFT SECOND STAGE BASILIC VEIN TRANSPOSITION (Left: Arm Upper)  Patient Location: PACU  Anesthesia Type:MAC  Level of Consciousness: awake and alert   Airway & Oxygen Therapy: Patient Spontanous Breathing  Post-op Assessment: Report given to RN and Post -op Vital signs reviewed and stable  Post vital signs: Reviewed and stable  Last Vitals:  Vitals Value Taken Time  BP 132/64 10/22/20 1049  Temp    Pulse 62 10/22/20 1050  Resp 14 10/22/20 1050  SpO2 98 % 10/22/20 1050  Vitals shown include unvalidated device data.  Last Pain:  Vitals:   10/22/20 0647  TempSrc:   PainSc: 0-No pain         Complications: No notable events documented.

## 2020-10-22 NOTE — Op Note (Signed)
Procedure: Left second stage basilic vein transposition fistula  Preoperative diagnosis: End-stage renal disease  Postoperative diagnosis: Same  Anesthesia: Regional block  Assistant: Arlee Muslim, PA-C for assistance with exposure and expediting procedure  Operative findings: 6 mm basilic vein  Operative details: After obtaining form consent, the patient taken the operating.  The patient was placed in supine position operating table.  A regional block of been placed in the upper extremity by the anesthesia team.  Next ultrasound was used to identify the course of the basilic vein fistula.  A longitudinal incision was made through pre-existing scar near the antecubital crease.  Incision was carried down through subcutaneous tissues down the level but basilic vein.  This was traced all the way back up to the level of the arterial anastomosis.  It was dissected free circumferentially.  A good thrill within it.  I then proceeded to dissect out the basilic vein through 3 additional skip incisions all the way up to the axilla.  There were several broad-based posterior branches which required repair with 6-0 Prolene sutures.  Other small side branches were ligated divided tween silk ties.  Next a subcutaneous tunnel was created connecting the antecubital incision to the axillary incision.  The patient was given 10,000 units of intravenous heparin.  The fistula was controlled proximally with a fine bulldog clamp.  It was then transected after marking for orientation.  It was gently distended with heparinized saline.  It was of good quality about 6 mm in diameter throughout its course.  The vein was then brought through the subcutaneous tunnel down to the level of the antecubital crease.  A small thickened valve was debrided away and the vein was then sewn end-to-end using a running 6-0 Prolene suture.  Just prior to completion of anastomosis it was for blood backbled and thoroughly flushed.  Anastomosis was  secured clamps released there is a palpable thrill in fistula immediately.  Patient was given 100 mg of protamine.  Patient had a palpable radial pulse.  Hemostasis was obtained.  All 4 incisions were then closed with multiple layers of running 3-0 Vicryl in the subcutaneous layers 4-0 Vicryl subcuticular stitch in the skin.  The patient tolerated procedure well and there were no complications.  The instrument sponge and needle count was correct end of the case.  Patient was taken to recovery in stable condition.  Ruta Hinds, MD [facility] Office: [phone]

## 2020-10-23 ENCOUNTER — Encounter (HOSPITAL_COMMUNITY): Payer: Self-pay | Admitting: Vascular Surgery

## 2020-10-23 DIAGNOSIS — Z992 Dependence on renal dialysis: Secondary | ICD-10-CM | POA: Diagnosis not present

## 2020-10-23 DIAGNOSIS — N186 End stage renal disease: Secondary | ICD-10-CM | POA: Diagnosis not present

## 2020-10-23 DIAGNOSIS — N2581 Secondary hyperparathyroidism of renal origin: Secondary | ICD-10-CM | POA: Diagnosis not present

## 2020-10-23 NOTE — Anesthesia Postprocedure Evaluation (Signed)
Anesthesia Post Note  Patient: Craig Flynn  Procedure(s) Performed: LEFT SECOND STAGE BASILIC VEIN TRANSPOSITION (Left: Arm Upper)     Patient location during evaluation: PACU Anesthesia Type: Regional and MAC Level of consciousness: awake and alert Pain management: pain level controlled Vital Signs Assessment: post-procedure vital signs reviewed and stable Respiratory status: spontaneous breathing, nonlabored ventilation, respiratory function stable and patient connected to nasal cannula oxygen Cardiovascular status: stable and blood pressure returned to baseline Postop Assessment: no apparent nausea or vomiting Anesthetic complications: no   No notable events documented.  Last Vitals:  Vitals:   10/22/20 1104 10/22/20 1115  BP: (!) 145/82 (!) 145/82  Pulse: 60 63  Resp: 12 16  Temp:  36.9 C  SpO2: 100% 96%    Last Pain:  Vitals:   10/22/20 1115  TempSrc:   PainSc: [place]

## 2020-10-25 DIAGNOSIS — N186 End stage renal disease: Secondary | ICD-10-CM | POA: Diagnosis not present

## 2020-10-25 DIAGNOSIS — N2581 Secondary hyperparathyroidism of renal origin: Secondary | ICD-10-CM | POA: Diagnosis not present

## 2020-10-25 DIAGNOSIS — Z992 Dependence on renal dialysis: Secondary | ICD-10-CM | POA: Diagnosis not present

## 2020-10-28 DIAGNOSIS — Z992 Dependence on renal dialysis: Secondary | ICD-10-CM | POA: Diagnosis not present

## 2020-10-28 DIAGNOSIS — N186 End stage renal disease: Secondary | ICD-10-CM | POA: Diagnosis not present

## 2020-10-28 DIAGNOSIS — N2581 Secondary hyperparathyroidism of renal origin: Secondary | ICD-10-CM | POA: Diagnosis not present

## 2020-10-30 DIAGNOSIS — Z992 Dependence on renal dialysis: Secondary | ICD-10-CM | POA: Diagnosis not present

## 2020-10-30 DIAGNOSIS — N2581 Secondary hyperparathyroidism of renal origin: Secondary | ICD-10-CM | POA: Diagnosis not present

## 2020-10-30 DIAGNOSIS — N186 End stage renal disease: Secondary | ICD-10-CM | POA: Diagnosis not present

## 2020-11-01 DIAGNOSIS — N186 End stage renal disease: Secondary | ICD-10-CM | POA: Diagnosis not present

## 2020-11-01 DIAGNOSIS — N2581 Secondary hyperparathyroidism of renal origin: Secondary | ICD-10-CM | POA: Diagnosis not present

## 2020-11-01 DIAGNOSIS — Z992 Dependence on renal dialysis: Secondary | ICD-10-CM | POA: Diagnosis not present

## 2020-11-04 DIAGNOSIS — N2581 Secondary hyperparathyroidism of renal origin: Secondary | ICD-10-CM | POA: Diagnosis not present

## 2020-11-04 DIAGNOSIS — N186 End stage renal disease: Secondary | ICD-10-CM | POA: Diagnosis not present

## 2020-11-04 DIAGNOSIS — Z992 Dependence on renal dialysis: Secondary | ICD-10-CM | POA: Diagnosis not present

## 2020-11-06 DIAGNOSIS — N186 End stage renal disease: Secondary | ICD-10-CM | POA: Diagnosis not present

## 2020-11-06 DIAGNOSIS — N2581 Secondary hyperparathyroidism of renal origin: Secondary | ICD-10-CM | POA: Diagnosis not present

## 2020-11-06 DIAGNOSIS — Z992 Dependence on renal dialysis: Secondary | ICD-10-CM | POA: Diagnosis not present

## 2020-11-08 DIAGNOSIS — Z992 Dependence on renal dialysis: Secondary | ICD-10-CM | POA: Diagnosis not present

## 2020-11-08 DIAGNOSIS — N186 End stage renal disease: Secondary | ICD-10-CM | POA: Diagnosis not present

## 2020-11-08 DIAGNOSIS — N2581 Secondary hyperparathyroidism of renal origin: Secondary | ICD-10-CM | POA: Diagnosis not present

## 2020-11-11 DIAGNOSIS — Z992 Dependence on renal dialysis: Secondary | ICD-10-CM | POA: Diagnosis not present

## 2020-11-11 DIAGNOSIS — N2581 Secondary hyperparathyroidism of renal origin: Secondary | ICD-10-CM | POA: Diagnosis not present

## 2020-11-11 DIAGNOSIS — N186 End stage renal disease: Secondary | ICD-10-CM | POA: Diagnosis not present

## 2020-11-13 ENCOUNTER — Other Ambulatory Visit: Payer: Self-pay

## 2020-11-13 ENCOUNTER — Ambulatory Visit (INDEPENDENT_AMBULATORY_CARE_PROVIDER_SITE_OTHER): Payer: Medicare HMO | Admitting: Physician Assistant

## 2020-11-13 VITALS — BP 148/66 | HR 73 | Temp 98.0°F | Ht 74.0 in | Wt 260.4 lb

## 2020-11-13 DIAGNOSIS — N186 End stage renal disease: Secondary | ICD-10-CM

## 2020-11-13 DIAGNOSIS — N2581 Secondary hyperparathyroidism of renal origin: Secondary | ICD-10-CM | POA: Diagnosis not present

## 2020-11-13 DIAGNOSIS — Z992 Dependence on renal dialysis: Secondary | ICD-10-CM

## 2020-11-13 NOTE — Progress Notes (Signed)
    Postoperative Access Visit   History of Present Illness   Craig Flynn is a 60 y.o. year old male who presents for postoperative follow-up for: left second stage basilic vein transposition by Dr. Oneida Alar (Date: 10/22/20).  The patient's wounds are healed.  The patient denies steal symptoms.  The patient is  able to complete their activities of daily living.  He is dialyzing via R IJ TDC on a MWF schedule at the [facility] location.   Physical Examination   Vitals:   11/13/20 0843  BP: (!) 148/66  Pulse: 73  Temp: 98 F (36.7 C)  TempSrc: Skin  SpO2: 100%  Weight: 260 lb 6.4 oz (118.1 kg)  Height: 6\' 2"  (1.88 m)   Body mass index is 33.43 kg/m.  left arm Incisions are healed, palpable radial pulse, hand grip is 5/5, sensation in digits is intact, palpable thrill, bruit can be auscultated     Medical Decision Making   Craig Flynn is a 60 y.o. year old male who presents s/p left second stage basilic vein transposition  Patent basilic fistula without signs or symptoms of steal syndrome The patient's access will be ready for use 11/18/20 The patient's tunneled dialysis catheter can be removed when Nephrology is comfortable with the performance of the fistula The patient may follow up on a prn basis   Dagoberto Ligas PA-C [facility] Office: [phone]  Clinic MD: Scot Dock

## 2020-11-14 ENCOUNTER — Ambulatory Visit: Payer: Medicare HMO | Admitting: Cardiology

## 2020-11-15 DIAGNOSIS — Z992 Dependence on renal dialysis: Secondary | ICD-10-CM | POA: Diagnosis not present

## 2020-11-15 DIAGNOSIS — N2581 Secondary hyperparathyroidism of renal origin: Secondary | ICD-10-CM | POA: Diagnosis not present

## 2020-11-15 DIAGNOSIS — N186 End stage renal disease: Secondary | ICD-10-CM | POA: Diagnosis not present

## 2020-11-18 DIAGNOSIS — Z992 Dependence on renal dialysis: Secondary | ICD-10-CM | POA: Diagnosis not present

## 2020-11-18 DIAGNOSIS — N186 End stage renal disease: Secondary | ICD-10-CM | POA: Diagnosis not present

## 2020-11-18 DIAGNOSIS — N2581 Secondary hyperparathyroidism of renal origin: Secondary | ICD-10-CM | POA: Diagnosis not present

## 2020-11-20 DIAGNOSIS — I129 Hypertensive chronic kidney disease with stage 1 through stage 4 chronic kidney disease, or unspecified chronic kidney disease: Secondary | ICD-10-CM | POA: Diagnosis not present

## 2020-11-20 DIAGNOSIS — Z992 Dependence on renal dialysis: Secondary | ICD-10-CM | POA: Diagnosis not present

## 2020-11-20 DIAGNOSIS — N2581 Secondary hyperparathyroidism of renal origin: Secondary | ICD-10-CM | POA: Diagnosis not present

## 2020-11-20 DIAGNOSIS — N186 End stage renal disease: Secondary | ICD-10-CM | POA: Diagnosis not present

## 2020-11-22 DIAGNOSIS — N186 End stage renal disease: Secondary | ICD-10-CM | POA: Diagnosis not present

## 2020-11-22 DIAGNOSIS — N2581 Secondary hyperparathyroidism of renal origin: Secondary | ICD-10-CM | POA: Diagnosis not present

## 2020-11-22 DIAGNOSIS — Z992 Dependence on renal dialysis: Secondary | ICD-10-CM | POA: Diagnosis not present

## 2020-11-25 DIAGNOSIS — Z992 Dependence on renal dialysis: Secondary | ICD-10-CM | POA: Diagnosis not present

## 2020-11-25 DIAGNOSIS — N186 End stage renal disease: Secondary | ICD-10-CM | POA: Diagnosis not present

## 2020-11-25 DIAGNOSIS — N2581 Secondary hyperparathyroidism of renal origin: Secondary | ICD-10-CM | POA: Diagnosis not present

## 2020-11-27 DIAGNOSIS — N2581 Secondary hyperparathyroidism of renal origin: Secondary | ICD-10-CM | POA: Diagnosis not present

## 2020-11-27 DIAGNOSIS — N186 End stage renal disease: Secondary | ICD-10-CM | POA: Diagnosis not present

## 2020-11-27 DIAGNOSIS — Z992 Dependence on renal dialysis: Secondary | ICD-10-CM | POA: Diagnosis not present

## 2020-11-28 ENCOUNTER — Telehealth: Payer: Self-pay | Admitting: *Deleted

## 2020-11-28 NOTE — Chronic Care Management (AMB) (Signed)
  Chronic Care Management   Note  11/28/2020 Name: Craig Flynn MRN: [id number] DOB: Mar 03, 1961  Craig Flynn is a 60 y.o. year old male who is a primary care patient of Ark, Agrusa, MD. I reached out to [name] by phone today in response to a referral sent by [name] PCP, Dr. Jenny Reichmann      [name] was given information about Chronic Care Management services today including:  CCM service includes personalized support from designated clinical staff supervised by his physician, including individualized plan of care and coordination with other care providers 24/7 contact phone numbers for assistance for urgent and routine care needs. Service will only be billed when office clinical staff spend 20 minutes or more in a month to coordinate care. Only one practitioner may furnish and bill the service in a calendar month. The patient may stop CCM services at any time (effective at the end of the month) by phone call to the office staff. The patient will be responsible for cost sharing (co-pay) of up to 20% of the service fee (after annual deductible is met).  Patient agreed to services and verbal consent obtained.   Follow up plan: Telephone appointment with care management team member scheduled for:12/19/20  Stacey Snead  Care Guide, Embedded Care Coordination [facility]  Care Management  Direct Dial: [phone]

## 2020-11-29 ENCOUNTER — Telehealth: Payer: Self-pay | Admitting: Cardiology

## 2020-11-29 ENCOUNTER — Other Ambulatory Visit: Payer: Self-pay | Admitting: Cardiology

## 2020-11-29 DIAGNOSIS — Z992 Dependence on renal dialysis: Secondary | ICD-10-CM | POA: Diagnosis not present

## 2020-11-29 DIAGNOSIS — N2581 Secondary hyperparathyroidism of renal origin: Secondary | ICD-10-CM | POA: Diagnosis not present

## 2020-11-29 DIAGNOSIS — N186 End stage renal disease: Secondary | ICD-10-CM | POA: Diagnosis not present

## 2020-11-29 NOTE — Telephone Encounter (Signed)
*  STAT* If patient is at the pharmacy, call can be transferred to refill team.   1. Which medications need to be refilled? (please list name of each medication and dose if known) isosorbide mononitrate (IMDUR) 30 MG 24 hr tablet  2. Which pharmacy/location (including street and city if local pharmacy) is medication to be sent to? [facility] [phone] - [place], [address]  3. Do they need a 30 day or 90 day supply? 90   Patient is out of medication

## 2020-12-01 ENCOUNTER — Emergency Department (HOSPITAL_COMMUNITY)
Admission: EM | Admit: 2020-12-01 | Discharge: 2020-12-01 | Disposition: A | Discharge status: Home / Self Care | Payer: Medicare HMO | Attending: Emergency Medicine | Admitting: Emergency Medicine

## 2020-12-01 ENCOUNTER — Emergency Department (HOSPITAL_COMMUNITY): Payer: Medicare HMO

## 2020-12-01 ENCOUNTER — Other Ambulatory Visit: Payer: Self-pay

## 2020-12-01 ENCOUNTER — Encounter (HOSPITAL_COMMUNITY): Payer: Self-pay | Admitting: Emergency Medicine

## 2020-12-01 DIAGNOSIS — Z86008 Personal history of in-situ neoplasm of other site: Secondary | ICD-10-CM | POA: Insufficient documentation

## 2020-12-01 DIAGNOSIS — I132 Hypertensive heart and chronic kidney disease with heart failure and with stage 5 chronic kidney disease, or end stage renal disease: Secondary | ICD-10-CM | POA: Insufficient documentation

## 2020-12-01 DIAGNOSIS — N186 End stage renal disease: Secondary | ICD-10-CM | POA: Insufficient documentation

## 2020-12-01 DIAGNOSIS — R079 Chest pain, unspecified: Secondary | ICD-10-CM | POA: Diagnosis not present

## 2020-12-01 DIAGNOSIS — I5042 Chronic combined systolic (congestive) and diastolic (congestive) heart failure: Secondary | ICD-10-CM | POA: Insufficient documentation

## 2020-12-01 DIAGNOSIS — E876 Hypokalemia: Secondary | ICD-10-CM | POA: Insufficient documentation

## 2020-12-01 DIAGNOSIS — Z992 Dependence on renal dialysis: Secondary | ICD-10-CM | POA: Diagnosis not present

## 2020-12-01 DIAGNOSIS — E1122 Type 2 diabetes mellitus with diabetic chronic kidney disease: Secondary | ICD-10-CM | POA: Insufficient documentation

## 2020-12-01 DIAGNOSIS — D631 Anemia in chronic kidney disease: Secondary | ICD-10-CM | POA: Insufficient documentation

## 2020-12-01 DIAGNOSIS — F17210 Nicotine dependence, cigarettes, uncomplicated: Secondary | ICD-10-CM | POA: Diagnosis not present

## 2020-12-01 DIAGNOSIS — Z85850 Personal history of malignant neoplasm of thyroid: Secondary | ICD-10-CM | POA: Insufficient documentation

## 2020-12-01 DIAGNOSIS — I2510 Atherosclerotic heart disease of native coronary artery without angina pectoris: Secondary | ICD-10-CM | POA: Diagnosis not present

## 2020-12-01 DIAGNOSIS — R072 Precordial pain: Secondary | ICD-10-CM | POA: Insufficient documentation

## 2020-12-01 DIAGNOSIS — E039 Hypothyroidism, unspecified: Secondary | ICD-10-CM | POA: Diagnosis not present

## 2020-12-01 LAB — BASIC METABOLIC PANEL
Anion gap: 8 (ref 5–15)
BUN: 32 mg/dL — ABNORMAL HIGH (ref 6–20)
CO2: 32 mmol/L (ref 22–32)
Calcium: 9.5 mg/dL (ref 8.9–10.3)
Chloride: 98 mmol/L (ref 98–111)
Creatinine, Ser: 5.7 mg/dL — ABNORMAL HIGH (ref 0.61–1.24)
GFR, Estimated: 11 mL/min — ABNORMAL LOW (ref >60)
Glucose, Bld: 85 mg/dL (ref 70–99)
Potassium: 2.8 mmol/L — ABNORMAL LOW (ref 3.5–5.1)
Sodium: 138 mmol/L (ref 135–145)

## 2020-12-01 LAB — CBC
HCT: 39.3 % (ref 39.0–52.0)
Hemoglobin: 11.9 g/dL — ABNORMAL LOW (ref 13.0–17.0)
MCH: 27.1 pg (ref 26.0–34.0)
MCHC: 30.3 g/dL (ref 30.0–36.0)
MCV: 89.5 fL (ref 80.0–100.0)
Platelets: 147 10*3/uL — ABNORMAL LOW (ref 150–400)
RBC: 4.39 MIL/uL (ref 4.22–5.81)
RDW: 14.7 % (ref 11.5–15.5)
WBC: 3.6 10*3/uL — ABNORMAL LOW (ref 4.0–10.5)
nRBC: 0 % (ref 0.0–0.2)

## 2020-12-01 LAB — TROPONIN I (HIGH SENSITIVITY)
Troponin I (High Sensitivity): 23 ng/L — ABNORMAL HIGH (ref <18)
Troponin I (High Sensitivity): 23 ng/L — ABNORMAL HIGH (ref <18)

## 2020-12-01 MED ORDER — DICLOFENAC SODIUM 1 % EX GEL: 2.0000 g | Freq: Four times a day (QID) | CUTANEOUS | 0 refills | Status: DC

## 2020-12-01 MED ORDER — PREDNISONE 10 MG (21) PO TBPK: ORAL_TABLET | Freq: Every day | ORAL | 0 refills | Status: DC

## 2020-12-01 NOTE — ED Provider Notes (Signed)
Emergency Department Provider Note   I have reviewed the triage vital signs and the nursing notes.   HISTORY  Chief Complaint Chest Pain   HPI Craig Flynn is a 60 y.o. male with past medical history reviewed below including recently starting dialysis in June of this year presents to the emergency department with left, anterior chest pain.  He feels a palpable lump in the area just left of his breastbone.  The tenderness is worse with moving or touching the area.  He denies any injury.  Is not feeling particularly short of breath.  He tried taking his Imdur and other medicines at home with no relief in symptoms.  He continues to go to dialysis and has not having complications with this.  They have begun using his dialysis graft but still has the HD cath in the right chest.  He is not having pain or other discomfort near the catheter. No fever/chills. No radiation of symptoms or modifying factors.    Past Medical History:  Diagnosis Date   Anemia 06/2015   Arthritis    knee   Cancer ([place])    thyroid cancer   Cardiomyopathy ([place])    a. 10/2013: EF reduced to 35-40% b. 09/2014: EF improved to 55-60%, Grade 1 DD noted.   Chest pain 06/2015   CHF (congestive heart failure) (HCC)    CKD (chronic kidney disease), stage IV [facility])    sees  Dr Lorrene Reid   Dyspnea    with exerion    GERD (gastroesophageal reflux disease)    Gout    Hypertension    Obesity    Pancreatitis    Sleep apnea    not wearing c-pap now, needs new slep study per pt. (01/22/2017)   Tuberculosis 1981   while the pt was in the service.   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[place]) 08/28/2020   Acute pancreatitis 08/22/2020   B12 deficiency 06/08/2020   Gynecomastia, male 06/07/2020   Erectile dysfunction 06/07/2020   Hand cramp 06/07/2020   Aortic atherosclerosis ([place]) 06/07/2020   Anemia due to acquired thiamine deficiency 84/16/6063   Umbilical hernia without obstruction and without gangrene 03/18/2020   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) 06/22/2019   Obesity, diabetes, and hypertension syndrome ([place]) 06/22/2019   Ulnar neuritis, right 04/25/2019   Muscle cramps 02/26/2019   Abnormal TSH 02/26/2019   Vitamin D deficiency 02/22/2019   Iron deficiency 02/22/2019   Chest pain 11/01/2018   Laryngopharyngeal reflux (LPR) 10/04/2018   Post-nasal drainage 10/04/2018   Whiplash injuries, initial encounter 09/21/2018   Degenerative arthritis of right knee 09/21/2018   Chronic diastolic HF (heart failure) ([place]) 07/04/2018   Intractable post-traumatic headache 06/16/2018   Low  back pain 06/15/2018   Neck pain 76/19/5093   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   Thyroid nodule 01/22/2017   Ptosis 26/71/2458   Chronic systolic heart failure ([place]) 08/01/2015   GERD (gastroesophageal reflux disease) 07/29/2015   Osteoarthritis 05/03/2015   Hyperlipidemia 09/98/3382   Diastolic dysfunction 50/53/9767   Tobacco abuse 02/26/2015   OSA (obstructive sleep apnea) 12/27/2014   Encounter for well adult exam with abnormal findings 11/12/2014   Anemia in chronic kidney disease  11/12/2014   Left knee pain 09/22/2014   Paresthesia of left leg 34/19/3790   Acute systolic CHF (congestive heart failure) ([place]) 11/17/2013   Morbid obesity ([place]) 11/17/2013   Diabetes mellitus ([place]) 12/12/2011   Acute on chronic renal failure ([place]) 12/11/2011   HTN (hypertension) 12/11/2011   SOB (shortness of breath) 12/11/2011   Gout 12/31/2006   HERNIATED LUMBAR DISK WITH RADICULOPATHY 12/30/2006    Past Surgical History:  Procedure Laterality Date   AV FISTULA PLACEMENT Left 03/25/2016   Procedure: Creation of Left Arm ARTERIOVENOUS (AV) FISTULA;  Surgeon: Elam Dutch, MD;  Location: [place];  Service: Vascular;  Laterality: Left;   AV FISTULA PLACEMENT Left 08/27/2020   Procedure: LEFT BASILIC VEIN ARTERIOVENOUS (AV) FISTULA CREATION;  Surgeon: Rosetta Posner, MD;  Location: [place];  Service: Vascular;  Laterality: Left;   [place] Left 10/22/2020   Procedure: LEFT SECOND STAGE BASILIC VEIN TRANSPOSITION;  Surgeon: Elam Dutch, MD;  Location: [place];  Service: Vascular;  Laterality: Left;   COLONOSCOPY W/ BIOPSIES AND POLYPECTOMY     "no problem"   HERNIA REPAIR     IR FLUORO GUIDE CV LINE RIGHT  08/22/2020   IR US GUIDE VASC ACCESS RIGHT  08/22/2020   LAPAROSCOPIC CHOLECYSTECTOMY     LIGATION OF COMPETING BRANCHES OF ARTERIOVENOUS FISTULA Left 01/25/2018   Procedure: LIGATION OF COMPETING BRANCHES And Revision of ARTERIOVENOUS FISTULA LEFT ARM.;  Surgeon: Elam Dutch, MD;  Location: [place];  Service: Vascular;  Laterality: Left;   THYROIDECTOMY  01/22/2017   THYROIDECTOMY Left 01/22/2017   Procedure: THYROIDECTOMY;  Surgeon: Melida Quitter, MD;  Location: [place];  Service: ENT;  Laterality: Left;   THYROIDECTOMY Right 03/26/2017   Procedure: COMPLETION OF THYROIDECTOMY;  Surgeon: Melida Quitter, MD;  Location: [place];  Service: ENT;  Laterality: Right;   UMBILICAL HERNIA REPAIR     WISDOM TOOTH EXTRACTION      Allergies Patient has no known  allergies.  Family History  Problem Relation Age of Onset   Stroke Mother    Pneumonia Father        died of Pneumonia x3   Kidney disease Maternal Grandmother    Hypertension Maternal Grandmother    Healthy Maternal Grandfather    Healthy Paternal Grandmother    Healthy Paternal Grandfather    CAD Neg Hx    Colon cancer Neg Hx    Esophageal cancer Neg Hx    Pancreatic cancer Neg Hx    Prostate cancer Neg Hx    Stomach cancer Neg Hx    Rectal cancer Neg Hx     Social History Social History   Tobacco Use   Smoking status: Every Day    Packs/day: 0.25    Years: 40.00    Pack years: 10.00    Types: Cigarettes   Smokeless tobacco: Never   Tobacco comments:    3 cigarettes per day  Vaping Use   Vaping Use: Never used  Substance Use Topics  Alcohol use: Not Currently    Alcohol/week: 0.0 standard drinks   Drug use: No    Review of Systems  Constitutional: No fever/chills Eyes: No visual changes. ENT: No sore throat. Cardiovascular: Positive chest pain. Respiratory: Denies shortness of breath. Gastrointestinal: No abdominal pain.  No nausea, no vomiting.  No diarrhea.  No constipation. Genitourinary: Negative for dysuria. Musculoskeletal: Negative for back pain. Skin: Negative for rash. Neurological: Negative for headaches, focal weakness or numbness.  10-point ROS otherwise negative.  ____________________________________________   PHYSICAL EXAM:  VITAL SIGNS: ED Triage Vitals  Enc Vitals Group     BP 12/01/20 1205 (!) 144/78     Pulse Rate 12/01/20 1205 71     Resp 12/01/20 1205 16     Temp 12/01/20 1205 98.7 F (37.1 C)     Temp Source 12/01/20 1205 Oral     SpO2 12/01/20 1205 100 %   Constitutional: Alert and oriented. Well appearing and in no acute distress. Eyes: Conjunctivae are normal.  Head: Atraumatic. Nose: No congestion/rhinnorhea. Mouth/Throat: Mucous membranes are moist.  Neck: No stridor.   Cardiovascular: Normal rate, regular  rhythm. Good peripheral circulation. Grossly normal heart sounds.   Respiratory: Normal respiratory effort.  No retractions. Lungs CTAB. Gastrointestinal: Soft and nontender. No distention.  Musculoskeletal: No gross deformities of extremities.  Point tenderness to palpation over the left parasternal margin.  There is a small amount of localized inflammation and swelling without overlying cellulitis, erythema, warmth, abscess. Patient winces with palpation and notes this is his area of discomfort.  Neurologic:  Normal speech and language.  Skin:  Skin is warm, dry and intact. No rash noted.   ____________________________________________   LABS (all labs ordered are listed, but only abnormal results are displayed)  Labs Reviewed  BASIC METABOLIC PANEL - Abnormal; Notable for the following components:      Result Value   Potassium 2.8 (*)    BUN 32 (*)    Creatinine, Ser 5.70 (*)    GFR, Estimated 11 (*)    All other components within normal limits  CBC - Abnormal; Notable for the following components:   WBC 3.6 (*)    Hemoglobin 11.9 (*)    Platelets 147 (*)    All other components within normal limits  TROPONIN I (HIGH SENSITIVITY) - Abnormal; Notable for the following components:   Troponin I (High Sensitivity) 23 (*)    All other components within normal limits  TROPONIN I (HIGH SENSITIVITY) - Abnormal; Notable for the following components:   Troponin I (High Sensitivity) 23 (*)    All other components within normal limits   ____________________________________________  EKG   EKG Interpretation  Date/Time:  Sunday December 01 2020 12:02:58 EDT Ventricular Rate:  60 PR Interval:  192 QRS Duration: 94 QT Interval:  448 QTC Calculation: 448 R Axis:   14 Text Interpretation: Normal sinus rhythm Normal ECG Confirmed by Nanda Quinton [phone]) on 12/01/2020 1:39:45 PM        ____________________________________________  RADIOLOGY  DG Chest 2 View  Result Date:  12/01/2020 CLINICAL DATA:  Chest pain for 1 day.  Ex-smoker. EXAM: CHEST - 2 VIEW COMPARISON:  07/31/2020 FINDINGS: Normal sized heart. Interval right jugular catheter with its tip in the inferior aspect of the superior vena cava. Mild-to-moderate peribronchial thickening with mild progression. No airspace consolidation or pleural fluid. Normal vascularity. Unremarkable bones. Cholecystectomy clips. IMPRESSION: Mild to moderate bronchitic changes with mild progression. Electronically Signed   By: Percell Locus.D.  On: 12/01/2020 13:58    ____________________________________________   PROCEDURES  Procedure(s) performed:   Procedures  None  ____________________________________________   INITIAL IMPRESSION / ASSESSMENT AND PLAN / ED COURSE  Pertinent labs & imaging results that were available during my care of the patient were reviewed by me and considered in my medical decision making (see chart for details).   Patient presents to the emergency department for evaluation of left chest pain.  Seems very musculoskeletal, possibly costochondritis.  He has a focal area of tenderness that I am able to palpate and reproduce his pain symptoms.  Suspicion for CAD or PE is very low in this case.  EKG seems consistent with prior tracings as outlined above.  I personally reviewed the chest x-ray imaging.  Do plan for serial troponins.  His first troponin is mildly elevated which I would expect in the setting of ESRD.   Second troponin is flat at 23.  Mild hypokalemia but no EKG changes.  As patient is on dialysis we will not address specifically here.  Patient will continue to follow with his dialysis sessions and nephrology.  Plan for prednisone with taper along with topical NSAIDs.  Exam seems most consistent with costochondritis. Discussed PCP follow up along with strict ED return precautions.  ____________________________________________  FINAL CLINICAL IMPRESSION(S) / ED DIAGNOSES  Final  diagnoses:  Precordial chest pain    NEW OUTPATIENT MEDICATIONS STARTED DURING THIS VISIT:  New Prescriptions   DICLOFENAC SODIUM (VOLTAREN) 1 % GEL    Apply 2 g topically 4 (four) times daily.   PREDNISONE (STERAPRED UNI-PAK 21 TAB) 10 MG (21) TBPK TABLET    Take by mouth daily. Take 6 tabs by mouth daily  for 2 days, then 5 tabs for 2 days, then 4 tabs for 2 days, then 3 tabs for 2 days, 2 tabs for 2 days, then 1 tab by mouth daily for 2 days    Note:  This document was prepared using Dragon voice recognition software and may include unintentional dictation errors.  Nanda Quinton, MD, [facility]    Shaquia Berkley, [facility], MD 12/01/20 [phone]

## 2020-12-01 NOTE — ED Triage Notes (Signed)
Pt reports L sided chest pain and a "knot" on chest with blurred vision since yesterday.  Last dialysis on Friday.  No increased SOB. Denies nausea and vomiting.

## 2020-12-01 NOTE — Discharge Instructions (Signed)
You were seen in the emergency department today with chest discomfort.  I suspect your pain is coming from muscles and bones in your chest wall.  Your labs did not show signs of a heart attack.  Please continue your dialysis and continue to coordinate with your primary care doctor.  Return to the emergency department any new or suddenly worsening symptoms.

## 2020-12-02 DIAGNOSIS — Z992 Dependence on renal dialysis: Secondary | ICD-10-CM | POA: Diagnosis not present

## 2020-12-02 DIAGNOSIS — N186 End stage renal disease: Secondary | ICD-10-CM | POA: Diagnosis not present

## 2020-12-02 DIAGNOSIS — N2581 Secondary hyperparathyroidism of renal origin: Secondary | ICD-10-CM | POA: Diagnosis not present

## 2020-12-04 DIAGNOSIS — Z992 Dependence on renal dialysis: Secondary | ICD-10-CM | POA: Diagnosis not present

## 2020-12-04 DIAGNOSIS — N186 End stage renal disease: Secondary | ICD-10-CM | POA: Diagnosis not present

## 2020-12-04 DIAGNOSIS — N2581 Secondary hyperparathyroidism of renal origin: Secondary | ICD-10-CM | POA: Diagnosis not present

## 2020-12-05 ENCOUNTER — Other Ambulatory Visit: Payer: Self-pay | Admitting: Internal Medicine

## 2020-12-05 NOTE — Telephone Encounter (Signed)
Please refill as per office routine med refill policy (all routine meds to be refilled for 3 mo or monthly (per pt preference) up to one year from last visit, then month to month grace period for 3 mo, then further med refills will have to be denied) ? ?

## 2020-12-06 DIAGNOSIS — Z992 Dependence on renal dialysis: Secondary | ICD-10-CM | POA: Diagnosis not present

## 2020-12-06 DIAGNOSIS — N2581 Secondary hyperparathyroidism of renal origin: Secondary | ICD-10-CM | POA: Diagnosis not present

## 2020-12-06 DIAGNOSIS — N186 End stage renal disease: Secondary | ICD-10-CM | POA: Diagnosis not present

## 2020-12-09 DIAGNOSIS — N2581 Secondary hyperparathyroidism of renal origin: Secondary | ICD-10-CM | POA: Diagnosis not present

## 2020-12-09 DIAGNOSIS — Z992 Dependence on renal dialysis: Secondary | ICD-10-CM | POA: Diagnosis not present

## 2020-12-09 DIAGNOSIS — N186 End stage renal disease: Secondary | ICD-10-CM | POA: Diagnosis not present

## 2020-12-10 ENCOUNTER — Ambulatory Visit: Payer: Medicare HMO | Admitting: Internal Medicine

## 2020-12-11 DIAGNOSIS — N186 End stage renal disease: Secondary | ICD-10-CM | POA: Diagnosis not present

## 2020-12-11 DIAGNOSIS — N2581 Secondary hyperparathyroidism of renal origin: Secondary | ICD-10-CM | POA: Diagnosis not present

## 2020-12-11 DIAGNOSIS — Z992 Dependence on renal dialysis: Secondary | ICD-10-CM | POA: Diagnosis not present

## 2020-12-12 ENCOUNTER — Other Ambulatory Visit: Payer: Self-pay

## 2020-12-12 ENCOUNTER — Ambulatory Visit (INDEPENDENT_AMBULATORY_CARE_PROVIDER_SITE_OTHER): Payer: Medicare HMO | Admitting: Internal Medicine

## 2020-12-12 ENCOUNTER — Encounter: Payer: Self-pay | Admitting: Internal Medicine

## 2020-12-12 VITALS — BP 130/62 | HR 68 | Temp 98.0°F | Ht 74.0 in | Wt 261.0 lb

## 2020-12-12 DIAGNOSIS — Z452 Encounter for adjustment and management of vascular access device: Secondary | ICD-10-CM | POA: Diagnosis not present

## 2020-12-12 DIAGNOSIS — R739 Hyperglycemia, unspecified: Secondary | ICD-10-CM | POA: Diagnosis not present

## 2020-12-12 DIAGNOSIS — Z720 Tobacco use: Secondary | ICD-10-CM | POA: Diagnosis not present

## 2020-12-12 DIAGNOSIS — N186 End stage renal disease: Secondary | ICD-10-CM | POA: Diagnosis not present

## 2020-12-12 DIAGNOSIS — E559 Vitamin D deficiency, unspecified: Secondary | ICD-10-CM | POA: Diagnosis not present

## 2020-12-12 DIAGNOSIS — I10 Essential (primary) hypertension: Secondary | ICD-10-CM | POA: Diagnosis not present

## 2020-12-12 DIAGNOSIS — Z992 Dependence on renal dialysis: Secondary | ICD-10-CM | POA: Diagnosis not present

## 2020-12-12 NOTE — Progress Notes (Signed)
Patient ID: Craig Flynn, male   DOB: Jun 18, 1960, 60 y.o.   MRN: [id number]        Chief Complaint: follow up HTN, and hyperglycemia, tobacco, low vit d       HPI:  Craig Flynn is a 60 y.o. male here overall doing ok.  Pt denies chest pain, increased sob or doe, wheezing, orthopnea, PND, increased LE swelling, palpitations, dizziness or syncope.   Pt denies polydipsia, polyuria, or new focal neuro s/s.   Pt denies fever, wt loss, night sweats, loss of appetite, or other constitutional symptoms     Right chest HD catheter to be out later today. Seeing Dr Posey Pronto.  Also seeing 2 mo for testing for f/u tyroid cancer.  Being consdiered for renal transplant.  Also with eye MD appt soon.  Pt hoping to have right knee TKR with Dr Onnie Graham before end of oct 2022.  No other new complaints.  Not ready to quit smoking.  Not taking Vit D  Wt Readings from Last 3 Encounters:  12/12/20 261 lb (118.4 kg)  11/13/20 260 lb 6.4 oz (118.1 kg)  10/22/20 250 lb (113.4 kg)   BP Readings from Last 3 Encounters:  12/12/20 130/62  12/01/20 (!) 152/91  11/13/20 (!) 148/66         Past Medical History:  Diagnosis Date   Anemia 06/2015   Arthritis    knee   Cancer ([facility])    thyroid cancer   Cardiomyopathy ([place])    a. 10/2013: EF reduced to 35-40% b. 09/2014: EF improved to 55-60%, Grade 1 DD noted.   Chest pain 06/2015   CHF (congestive heart failure) (HCC)    CKD (chronic kidney disease), stage IV [facility])    sees  Dr Lorrene Reid   Dyspnea    with exerion    GERD (gastroesophageal reflux disease)    Gout    Hypertension    Obesity    Pancreatitis    Sleep apnea    not wearing c-pap now, needs new slep study per pt. (01/22/2017)   Tuberculosis 1981   while the pt was in the service.   Tubular adenoma of colon 10/2015   Wears glasses    Past Surgical History:  Procedure Laterality Date   AV FISTULA PLACEMENT Left 03/25/2016   Procedure: Creation of Left Arm ARTERIOVENOUS (AV) FISTULA;  Surgeon: Elam Dutch, MD;  Location: [facility] OR;  Service: Vascular;  Laterality: Left;   AV FISTULA PLACEMENT Left 08/27/2020   Procedure: LEFT BASILIC VEIN ARTERIOVENOUS (AV) FISTULA CREATION;  Surgeon: Rosetta Posner, MD;  Location: [place];  Service: Vascular;  Laterality: Left;   [place] Left 10/22/2020   Procedure: LEFT SECOND STAGE BASILIC VEIN TRANSPOSITION;  Surgeon: Elam Dutch, MD;  Location: [place];  Service: Vascular;  Laterality: Left;   COLONOSCOPY W/ BIOPSIES AND POLYPECTOMY     "no problem"   HERNIA REPAIR     IR FLUORO GUIDE CV LINE RIGHT  08/22/2020   IR US GUIDE VASC ACCESS RIGHT  08/22/2020   LAPAROSCOPIC CHOLECYSTECTOMY     LIGATION OF COMPETING BRANCHES OF ARTERIOVENOUS FISTULA Left 01/25/2018   Procedure: LIGATION OF COMPETING BRANCHES And Revision of ARTERIOVENOUS FISTULA LEFT ARM.;  Surgeon: Elam Dutch, MD;  Location: [place];  Service: Vascular;  Laterality: Left;   THYROIDECTOMY  01/22/2017   THYROIDECTOMY Left 01/22/2017   Procedure: THYROIDECTOMY;  Surgeon: Melida Quitter, MD;  Location: [place];  Service: ENT;  Laterality: Left;   THYROIDECTOMY Right 03/26/2017   Procedure: COMPLETION OF THYROIDECTOMY;  Surgeon: Melida Quitter, MD;  Location: [place];  Service: ENT;  Laterality: Right;   UMBILICAL HERNIA REPAIR     WISDOM TOOTH EXTRACTION      reports that he has been smoking cigarettes. He has a 10.00 pack-year smoking history. He has never used smokeless tobacco. He reports that he does not currently use alcohol. He reports that he does not use drugs. family history includes Healthy in his maternal grandfather, paternal grandfather, and paternal grandmother; Hypertension in his maternal grandmother; Kidney disease in his maternal grandmother; Pneumonia in his father; Stroke in his mother. No Known Allergies Current Outpatient Medications on File Prior to Visit  Medication Sig Dispense Refill   acetaminophen (TYLENOL) 500 MG tablet Take 2 tablets (1,000 mg total) by  mouth every 8 (eight) hours as needed for mild pain or headache. 60 tablet 0   acetaminophen (TYLENOL) 500 MG tablet Take 1,000 mg by mouth every 6 (six) hours as needed for moderate pain.     albuterol (VENTOLIN HFA) 108 (90 Base) MCG/ACT inhaler INHALE 2 PUFFS INTO THE LUNGS EVERY 6 HOURS AS NEEDED FOR WHEEZING OR SHORTNESS OF BREATH (Patient taking differently: Inhale 2 puffs into the lungs every 6 (six) hours as needed for wheezing or shortness of breath.) 18 g 5   aspirin EC 81 MG tablet Take 81 mg by mouth daily.     carvedilol (COREG) 25 MG tablet TAKE 1 TABLET(25 MG) BY MOUTH TWICE DAILY 180 tablet 2   cyclobenzaprine (FLEXERIL) 10 MG tablet Take 1 tablet (10 mg total) by mouth 2 (two) times daily as needed for muscle spasms. 20 tablet 0   diclofenac Sodium (VOLTAREN) 1 % GEL Apply 2 g topically 4 (four) times daily. 100 g 0   fenofibrate 54 MG tablet TAKE 1 TABLET BY MOUTH EVERY DAY 60 tablet 0   heparin 1000 unit/mL SOLN injection Heparin Sodium (Porcine) 1,000 Units/mL Catheter Lock Arterial     HYDROcodone-acetaminophen (NORCO) 5-325 MG tablet Take 1 tablet by mouth every 6 (six) hours as needed for moderate pain. 20 tablet 0   iron polysaccharides (NIFEREX) 150 MG capsule Take 1 capsule (150 mg total) by mouth daily. 30 capsule 0   iron sucrose in sodium chloride 0.9 % 100 mL Iron Sucrose (Venofer)     isosorbide mononitrate (IMDUR) 30 MG 24 hr tablet TAKE 2 TABLETS(60 MG) BY MOUTH DAILY (Patient taking differently: Take 30 mg by mouth in the morning and at bedtime.) 60 tablet 1   levothyroxine (SYNTHROID) 137 MCG tablet Take 137 mcg by mouth every morning.     lidocaine-prilocaine (EMLA) cream Apply 1 application topically 3 (three) times a week. MWF     nitroGLYCERIN (NITROSTAT) 0.4 MG SL tablet DISSOLVE 1 TABLET UNDER THE TONGUE EVERY 5 MINUTES AS NEEDED FOR CHEST PAIN (Patient taking differently: Place 0.4 mg under the tongue every 5 (five) minutes as needed for chest pain.) 25 tablet  3   pantoprazole (PROTONIX) 40 MG tablet Take 1 tablet (40 mg total) by mouth 2 (two) times daily before a meal. 180 tablet 3   polyethylene glycol (MIRALAX / GLYCOLAX) 17 g packet Take 17 g by mouth 2 (two) times daily. (Patient taking differently: Take 17 g by mouth daily as needed for moderate constipation.) 14 each 0   Potassium Chloride ER 20 MEQ TBCR Take 1 tablet by mouth daily.     predniSONE (STERAPRED UNI-PAK 21  TAB) 10 MG (21) TBPK tablet Take by mouth daily. Take 6 tabs by mouth daily  for 2 days, then 5 tabs for 2 days, then 4 tabs for 2 days, then 3 tabs for 2 days, 2 tabs for 2 days, then 1 tab by mouth daily for 2 days 42 tablet 0   senna-docusate (SENOKOT-S) 8.6-50 MG tablet Take 2 tablets by mouth 2 (two) times daily. 60 tablet 0   thiamine (VITAMIN B-1) 50 MG tablet Take 1 tablet (50 mg total) by mouth daily. 90 tablet 1   vitamin B-12 (CYANOCOBALAMIN) 1000 MCG tablet Take 1,000 mcg by mouth daily.     No current facility-administered medications on file prior to visit.        ROS:  All others reviewed and negative.  Objective        PE:  BP 130/62 (BP Location: Right Arm, Patient Position: Sitting, Cuff Size: Large)   Pulse 68   Temp 98 F (36.7 C) (Oral)   Ht 6\' 2"  (1.88 m)   Wt 261 lb (118.4 kg)   SpO2 99%   BMI 33.51 kg/m                 Constitutional: Pt appears in NAD               HENT: Head: NCAT.                Right Ear: External ear normal.                 Left Ear: External ear normal.                Eyes: . Pupils are equal, round, and reactive to light. Conjunctivae and EOM are normal               Nose: without d/c or deformity               Neck: Neck supple. Gross normal ROM               Cardiovascular: Normal rate and regular rhythm.                 Pulmonary/Chest: Effort normal and breath sounds without rales or wheezing.                Abd:  Soft, NT, ND, + BS, no organomegaly               Neurological: Pt is alert. At baseline  orientation, motor grossly intact               Skin: Skin is warm. No rashes, no other new lesions, LE edema - none               Psychiatric: Pt behavior is normal without agitation   Micro: none  Cardiac tracings I have personally interpreted today:  none  Pertinent Radiological findings (summarize): none   Lab Results  Component Value Date   WBC 3.6 (L) 12/01/2020   HGB 11.9 (L) 12/01/2020   HCT 39.3 12/01/2020   PLT 147 (L) 12/01/2020   GLUCOSE 85 12/01/2020   CHOL 132 06/07/2020   TRIG 162 (H) 08/21/2020   HDL 30.40 (L) 06/07/2020   LDLCALC 72 06/07/2020   ALT 10 08/26/2020   AST 17 08/26/2020   NA 138 12/01/2020   K 2.8 (L) 12/01/2020   CL 98 12/01/2020   CREATININE 5.70 (H) 12/01/2020   BUN 32 (H)  12/01/2020   CO2 32 12/01/2020   TSH 0.50 06/07/2020   PSA 0.67 06/07/2020   HGBA1C 5.5 06/07/2020   Assessment/Plan:  Craig Flynn is a 60 y.o. Black or African American [2] male with  has a past medical history of Anemia (06/2015), Arthritis, Cancer ([place]), Cardiomyopathy ([place]), Chest pain (06/2015), CHF (congestive heart failure) ([place]), CKD (chronic kidney disease), stage IV ([place]), Dyspnea, GERD (gastroesophageal reflux disease), Gout, Hypertension, Obesity, Pancreatitis, Sleep apnea, Tuberculosis (1981), Tubular adenoma of colon (10/2015), and Wears glasses.  Vitamin D deficiency Last vitamin D Lab Results  Component Value Date   VD25OH 35.91 06/07/2020   Low, to start oral replacement   Tobacco abuse Pt counseled to quit, pt not ready  Hyperglycemia Lab Results  Component Value Date   HGBA1C 5.5 06/07/2020   Stable, pt to continue current medical treatment  - diet   HTN (hypertension) BP Readings from Last 3 Encounters:  12/12/20 130/62  12/01/20 (!) 152/91  11/13/20 (!) 148/66   Stable, pt to continue medical treatment coreg  Followup: Return in about 6 months (around 06/11/2021).  Cathlean Cower, MD 12/15/2020 7:09 PM [facility]

## 2020-12-12 NOTE — Patient Instructions (Signed)
Please continue all other medications as before, and refills have been done if requested.  Please have the pharmacy call with any other refills you may need.  Please continue your efforts at being more active, low cholesterol diet, and weight control.  Please keep your appointments with your specialists as you may have planned  Please make an Appointment to return in 6 months, or sooner if needed 

## 2020-12-13 DIAGNOSIS — Z992 Dependence on renal dialysis: Secondary | ICD-10-CM | POA: Diagnosis not present

## 2020-12-13 DIAGNOSIS — N186 End stage renal disease: Secondary | ICD-10-CM | POA: Diagnosis not present

## 2020-12-13 DIAGNOSIS — N2581 Secondary hyperparathyroidism of renal origin: Secondary | ICD-10-CM | POA: Diagnosis not present

## 2020-12-15 ENCOUNTER — Encounter: Payer: Self-pay | Admitting: Internal Medicine

## 2020-12-15 NOTE — Assessment & Plan Note (Signed)
Lab Results  ?Component Value Date  ? HGBA1C 5.5 06/07/2020  ? ?Stable, pt to continue current medical treatment  - diet ? ?

## 2020-12-15 NOTE — Assessment & Plan Note (Signed)
Pt counseled to quit, pt not ready 

## 2020-12-15 NOTE — Assessment & Plan Note (Signed)
BP Readings from Last 3 Encounters:  12/12/20 130/62  12/01/20 (!) 152/91  11/13/20 (!) 148/66   Stable, pt to continue medical treatment coreg

## 2020-12-15 NOTE — Assessment & Plan Note (Signed)
Last vitamin D Lab Results  Component Value Date   VD25OH 35.91 06/07/2020   Low, to start oral replacement  

## 2020-12-16 DIAGNOSIS — N2581 Secondary hyperparathyroidism of renal origin: Secondary | ICD-10-CM | POA: Diagnosis not present

## 2020-12-16 DIAGNOSIS — Z992 Dependence on renal dialysis: Secondary | ICD-10-CM | POA: Diagnosis not present

## 2020-12-16 DIAGNOSIS — N186 End stage renal disease: Secondary | ICD-10-CM | POA: Diagnosis not present

## 2020-12-18 DIAGNOSIS — Z992 Dependence on renal dialysis: Secondary | ICD-10-CM | POA: Diagnosis not present

## 2020-12-18 DIAGNOSIS — N186 End stage renal disease: Secondary | ICD-10-CM | POA: Diagnosis not present

## 2020-12-18 DIAGNOSIS — N2581 Secondary hyperparathyroidism of renal origin: Secondary | ICD-10-CM | POA: Diagnosis not present

## 2020-12-19 ENCOUNTER — Ambulatory Visit (INDEPENDENT_AMBULATORY_CARE_PROVIDER_SITE_OTHER): Payer: Medicare HMO | Admitting: *Deleted

## 2020-12-19 DIAGNOSIS — I5032 Chronic diastolic (congestive) heart failure: Secondary | ICD-10-CM

## 2020-12-19 DIAGNOSIS — N185 Chronic kidney disease, stage 5: Secondary | ICD-10-CM

## 2020-12-19 DIAGNOSIS — I5022 Chronic systolic (congestive) heart failure: Secondary | ICD-10-CM

## 2020-12-19 NOTE — Patient Instructions (Addendum)
Visit Information   Craig Flynn, it was nice talking with you today.    I look forward to talking to you again for an update on Thursday, March 20, 2021 at 9:00 am- please be listening out for my call that day.  I will call as close to 9:00 am as possible.   If you need to cancel or re-schedule our telephone visit, please call [phone] and one of our care guides will be happy to assist you.   I look forward to hearing about your progress.   Please don't hesitate to contact me if I can be of assistance to you before our next scheduled telephone appointment.   Oneta Rack, RN, BSN, [facility] RN Care Coordination- [place] [phone]: direct office [phone]: mobile   PATIENT GOALS:   Goals Addressed             This Visit's Progress    Patient Self- Care Activities   On track    Timeframe:  Long-Range Goal Priority:  Medium Start Date:       12/19/20                      Expected End Date:      12/19/21                 Patient will self administer medications as prescribed as evidenced by self report/primary caregiver report  Patient will attend all scheduled provider appointments as evidenced by clinician review of documented attendance to scheduled appointments and patient/caregiver report Patient will call pharmacy for medication refills as evidenced by patient report and review of pharmacy fill history as appropriate Patient will call provider office for new concerns or questions as evidenced by review of documented incoming telephone call notes and patient report Patient will continue to follow heart healthy, low salt, renal diet Patient will continue to attend hemodialysis sessions M-W-F as scheduled Patient will continue to stay active and walk for exercise daily       Food Basics for Chronic Kidney Disease Chronic kidney disease (CKD) occurs when the kidneys are permanently damaged over a long period of time. When your kidneys  are not working well, they cannot remove waste, fluids, and other substances from your blood as well as they did before. The substances can build up, which can worsen kidney damage and affect how your body functions. Certain foods lead to a buildup of these substances. By changing your diet, you can help prevent more kidney damage and delay or prevent the need for dialysis. What are tips for following this plan? Reading food labels Check the amount of salt (sodium) in foods. Choose foods that have less than 300 milligrams (mg) per serving. Check the ingredient list for phosphorus or potassium-based additives or preservatives. Check the amount of saturated fat and trans fat. Limit or avoid these fats as told by your dietitian. Shopping Avoid buying foods that are: Processed or prepackaged. Calcium-enriched or that have calcium added to them (are fortified). Do not buy foods that have salt or sodium listed among the first five ingredients. Buy canned vegetables and beans that say "no salt added" or "low sodium" and rinse them before eating. Cooking Soak vegetables, such as potatoes, before cooking to reduce potassium. To do this: Peel and cut the vegetables into small pieces. Soak the vegetables in warm water for at least 2 hours. For every 1 cup of vegetables, use 10 cups of water.  Drain and rinse the vegetables with warm water. Boil the vegetables for at least 5 minutes. Meal planning Limit the amount of protein you eat from plant and animal sources each day. Do not add salt to food when cooking or before eating. Eat meals and snacks at around the same time each day. General information Talk with your health care provider about whether you should take a vitamin and mineral supplement. Use standard measuring cups and spoons to measure servings of foods. Use a kitchen scale to measure portions of protein foods. If told by your health care provider, avoid drinking too much fluid. Measure and  count all liquids, including water, ice, soups, flavored gelatin, and frozen desserts such as ice pops or ice cream. If you have diabetes: If you have diabetes (diabetes mellitus) and CKD, it is important to keep your blood sugar (glucose) in the target range recommended by your health care provider. Follow your diabetes management plan. This may include: Checking your blood glucose regularly. Taking medicines by mouth, taking insulin, or taking both. Exercising for at least 30 minutes on 5 or more days each week, or as told by your health care provider. Tracking how many servings of carbohydrates you eat at each meal. You may be given specific guidelines on how much of certain foods and nutrients you may eat, depending on your stage of kidney disease and whether you have high blood pressure (hypertension). Follow your meal plan as told by your dietitian. What nutrients should I limit? Work with your health care provider and dietitian to develop a meal plan that is right for you. Foods you can eat and foods you should limit or avoid will depend on the stage of your kidney disease and any other health conditions you have. The items listed below are not a complete list. Talk with your dietitian about what dietary choices are best for you. Potassium Potassium affects how steadily your heart beats. If too much potassium builds up in your blood, the potassium can cause an irregular heartbeat or even a heart attack. You may need to limit or avoid foods that are high in potassium, such as: Milk and soy milk. Fruits, such as bananas, apricots, nectarines, melon, prunes, raisins, kiwi, and oranges. Vegetables, such as potatoes, sweet potatoes, yams, tomatoes, leafy greens, beets, avocado, pumpkin, and winter squash. White and lima beans. Whole-wheat breads and pastas. Beans and nuts. Phosphorus Phosphorus is a mineral found in your bones. A balance between calcium and phosphorus is needed to build and  maintain healthy bones. Too much phosphorus pulls calcium from your bones. This can make your bones weak and more likely to break. Too much phosphorus can also make your skin itch. You may need to limit or avoid foods that are high in phosphorus, such as: Milk and dairy products. Dried beans and peas. Tofu, soy milk, and other soy-based meat replacements. Dark-colored sodas. Nuts and peanut butter. Meat, poultry, and fish. Bran cereals and oatmeal. Protein Protein helps you make and keep muscle. It also helps to repair your body's cells and tissues. One of the natural breakdown products of protein is a waste product called urea. When your kidneys are not working properly, they cannot remove wastes, such as urea. Reducing how much protein you eat can help prevent a buildup of urea in your blood. Depending on your stage of kidney disease, you may need to limit foods that are high in protein. Sources of animal protein include: Meat (all types). Fish and seafood. Poultry.  Eggs. Dairy. Other protein foods include: Beans and legumes. Nuts and nut butter. Soy and tofu.  Sodium Sodium helps to maintain a healthy balance of fluids in your body. Too much sodium can increase your blood pressure and have a negative effect on your heart and lungs. Too much sodium can also cause your body to retain too much fluid, making your kidneys work harder. Most people should have less than 2,300 mg of sodium each day. If you have hypertension, you may need to limit your sodium to 1,500 mg each day. You may need to limit or avoid foods that are high in sodium, such as: Salt seasonings. Soy sauce. Cured and processed meats. Salted crackers and snack foods. Fast food. Canned soups and most canned foods. Pickled foods. Vegetable juice. Boxed mixes or ready-to-eat boxed meals and side dishes. Bottled dressings, sauces, and marinades. Talk with your dietitian about how much potassium, phosphorus, protein, and  sodium you may have each day. Summary Chronic kidney disease (CKD) can lead to a buildup of waste and extra substances in the body. Certain foods lead to a buildup of these substances. By changing your diet as told, you can help prevent more kidney damage and delay or prevent the need for dialysis. Food intake changes are different for each person with CKD. Work with a dietitian to set up nutrient goals and a meal plan that is right for you. If you have diabetes and CKD, it is important to keep your blood sugar in the target range recommended by your health care provider. This information is not intended to replace advice given to you by your health care provider. Make sure you discuss any questions you have with your health care provider. Document Revised: 07/03/2019 Document Reviewed: 07/03/2019 Elsevier Patient Education  2022 [employer].  Consent to CCM Services: Craig Flynn was given information about Chronic Care Management services including:  CCM service includes personalized support from designated clinical staff supervised by his physician, including individualized plan of care and coordination with other care providers 24/7 contact phone numbers for assistance for urgent and routine care needs. Service will only be billed when office clinical staff spend 20 minutes or more in a month to coordinate care. Only one practitioner may furnish and bill the service in a calendar month. The patient may stop CCM services at any time (effective at the end of the month) by phone call to the office staff. The patient will be responsible for cost sharing (co-pay) of up to 20% of the service fee (after annual deductible is met).  Patient agreed to services and verbal consent obtained.   The patient verbalized understanding of instructions, educational materials, and care plan provided today and agreed to receive a mailed copy of patient instructions, educational materials, and care plan Telephone  follow up appointment with care management team member scheduled for:  Thursday, March 20, 2021 at 9:00 am The patient has been provided with contact information for the care management team and has been advised to call with any health related questions or concerns   Oneta Rack, RN, BSN, [place] [phone]: direct office [phone]: mobile   CLINICAL CARE PLAN: Patient Care Plan: RN Care Manager Plan of Care     Problem Identified: Chronic Disease Management Needs   Priority: Medium     Long-Range Goal: Development of plan of care for long term chronic disease management   Start Date: 12/19/2020  Expected  End Date: 12/19/2021  Priority: Medium  Note:   Current Barriers:  Chronic Disease Management support and education needs related to CHF and ESRD New to hemodialysis; actively pursuing kidney transplant through [facility]):  Patient will demonstrate ongoing health management independence ESRD- on hemodialysis/ CHF  through collaboration with RN Care manager, provider, and care team.   Interventions: 1:1 collaboration with primary care provider regarding development and update of comprehensive plan of care as evidenced by provider attestation and co-signature Inter-disciplinary care team collaboration (see longitudinal plan of care) Evaluation of current treatment plan related to  self management and patient's adherence to plan as established by provider  Heart Failure Interventions:  (Status: New goal.) Basic overview and discussion of pathophysiology of Heart Failure reviewed; Provided education on low sodium diet; Discussed the importance of keeping all appointments with provider; Provided patient with education about the role of exercise in the management of heart failure; Screening for signs and symptoms of depression related to chronic disease state;  Assessed  social determinant of health barriers;  Medication review completed and medication list in EHR updated according to patient report: patient reports good general understanding of the purpose, dosing, and scheduling of prescribed medications: no concerns or discrepancies identified as a result of medication review today Discussed with patient and provided verbal education around safe use of NTG at home, action plan for unresolved chest pain/ pressure Confirmed patient follows heart healthy, low salt, renal diet Confirmed patient exercises/ is active- reports walks daily: positive reinforcement provided Discussed daily weight management at home: patient weighs self regularly at home, and also weighs weekly tid at hemodialysis sessions; reports general weight ranges at home currently between 251-255 lbs Positive reinforcement provided for reported weight loss: reports has lost > 100 lbs over 18 months to qualify for kidney transplant Confirmed patient has good baseline understanding of signs/ symptoms yellow CHF zone, along with corresponding action plan Reviewed upcoming scheduled provider appointments and confirmed patient has plans to attend as scheduled: 01/24/21-- cardiology provider [facility]) Discussed need to obtain flu vaccine for 2022-23 flu season: he reports he does not get flu vaccines due to adverse effect from same "years ago;" states was in hospital for 7 days after receiving flu vaccine in the past  Chronic Kidney Disease (Status: New goal.)  Last practice recorded BP readings:  BP Readings from Last 3 Encounters:  12/12/20 130/62  12/01/20 (!) 152/91  11/13/20 (!) 148/66  Most recent eGFR/CrCl: No results found for: EGFR  No components found for: CRCL  Evaluation of current treatment plan related to chronic kidney disease self management and patient's adherence to plan as established by provider      Reviewed prescribed diet renal/ heart healthy, low salt: confirmed he has has  formal diet/ nutrition education for renal diet provided at hemodialysis center Reviewed medications with patient and discussed importance of compliance    Counseled on the importance of exercise goals with target of 150 minutes per week     Discussed plans with patient for ongoing care management follow up and provided patient with direct contact information for care management team    Discussed the impact of chronic kidney disease on daily life and mental health and acknowledged and normalized feelings of disempowerment, fear, and frustration    Provided education on kidney disease progression    Engage patient in early, proactive and ongoing discussion about goals of care and what matters most to them    Discussed  general plan for upcoming renal/ kidney transplant: reports has appointment "next week" with [place] transplant team; hopes to be placed on transplant list "soon" Discussed patient's toleration of newly started hemodialysis: reports has tolerated well thus far, denies problems, concerns Discussed patient's daily fluid restriction guidelines: 3200 cc/d per patient report- reports he is following fluid restrictions according to instructions Confirmed patient has been adherent to hemodialysis schedule: has not missed any HD sessions, attending M-W-F in "mid-afternoon" hours  Patient Goals/Self-Care Activities: Patient will self administer medications as prescribed as evidenced by self report/primary caregiver report  Patient will attend all scheduled provider appointments as evidenced by clinician review of documented attendance to scheduled appointments and patient/caregiver report Patient will call pharmacy for medication refills as evidenced by patient report and review of pharmacy fill history as appropriate Patient will call provider office for new concerns or questions as evidenced by review of documented incoming telephone call notes and patient report Patient will  continue to follow heart healthy, low salt, renal diet Patient will continue to attend hemodialysis sessions M-W-F as scheduled Patient will continue to stay active and walk for exercise daily

## 2020-12-19 NOTE — Chronic Care Management (AMB) (Addendum)
Chronic Care Management   CCM RN Visit Note  12/19/2020 Name: Craig Flynn MRN: [id number] DOB: [birth year]  Subjective: Craig Flynn is a 60 y.o. year old male who is a primary care patient of Gen, Clagg, MD. The care management team was consulted for assistance with disease management and care coordination needs.    Engaged with patient by telephone for initial visit in response to provider referral for case management and/or care coordination services.   Consent to Services:  The patient was given information about Chronic Care Management services, agreed to services, and gave verbal consent 11/28/20 prior to initiation of services.  Please see initial visit note for detailed documentation.  Patient agreed to services and verbal consent obtained.   Assessment: Review of patient past medical history, allergies, medications, health status, including review of consultants reports, laboratory and other test data, was performed as part of comprehensive evaluation and provision of chronic care management services.   SDOH (Social Determinants of Health) assessments and interventions performed:  SDOH Interventions    Flowsheet Row Most Recent Value  SDOH Interventions   Food Insecurity Interventions Intervention Not Indicated  Housing Interventions Intervention Not Indicated  Transportation Interventions Intervention Not Indicated  [Patient drives self]       CCM Care Plan No Known Allergies  Outpatient Encounter Medications as of 12/19/2020  Medication Sig Note   acetaminophen (TYLENOL) 500 MG tablet Take 2 tablets (1,000 mg total) by mouth every 8 (eight) hours as needed for mild pain or headache.    acetaminophen (TYLENOL) 500 MG tablet Take 1,000 mg by mouth every 6 (six) hours as needed for moderate pain.    albuterol (VENTOLIN HFA) 108 (90 Base) MCG/ACT inhaler INHALE 2 PUFFS INTO THE LUNGS EVERY 6 HOURS AS NEEDED FOR WHEEZING OR SHORTNESS OF BREATH (Patient taking differently:  Inhale 2 puffs into the lungs every 6 (six) hours as needed for wheezing or shortness of breath.) 12/19/2020: 12/19/20- reports has not needed recently   aspirin EC 81 MG tablet Take 81 mg by mouth daily.    carvedilol (COREG) 25 MG tablet TAKE 1 TABLET(25 MG) BY MOUTH TWICE DAILY 12/19/2020: 12/19/20-- reports taking around Hemodialysis: does not take in am on mornings he gets HD (M-W-F)   fenofibrate 54 MG tablet TAKE 1 TABLET BY MOUTH EVERY DAY    isosorbide mononitrate (IMDUR) 30 MG 24 hr tablet TAKE 2 TABLETS(60 MG) BY MOUTH DAILY (Patient taking differently: Take 30 mg by mouth in the morning and at bedtime.)    levothyroxine (SYNTHROID) 137 MCG tablet Take 137 mcg by mouth every morning.    nitroGLYCERIN (NITROSTAT) 0.4 MG SL tablet DISSOLVE 1 TABLET UNDER THE TONGUE EVERY 5 MINUTES AS NEEDED FOR CHEST PAIN (Patient taking differently: Place 0.4 mg under the tongue every 5 (five) minutes as needed for chest pain.) 12/19/2020: 12/19/20: Reports has never needed to use   pantoprazole (PROTONIX) 40 MG tablet Take 1 tablet (40 mg total) by mouth 2 (two) times daily before a meal.    polyethylene glycol (MIRALAX / GLYCOLAX) 17 g packet Take 17 g by mouth 2 (two) times daily. (Patient taking differently: Take 17 g by mouth daily as needed for moderate constipation.) 12/19/2020: 12/19/20- patient reports has not needed recently   Potassium Chloride ER 20 MEQ TBCR Take 1 tablet by mouth daily.    vitamin B-12 (CYANOCOBALAMIN) 1000 MCG tablet Take 1,000 mcg by mouth daily.    cyclobenzaprine (FLEXERIL) 10 MG tablet Take 1 tablet (  10 mg total) by mouth 2 (two) times daily as needed for muscle spasms. (Patient not taking: Reported on 12/19/2020)    diclofenac Sodium (VOLTAREN) 1 % GEL Apply 2 g topically 4 (four) times daily. (Patient not taking: Reported on 12/19/2020)    heparin 1000 unit/mL SOLN injection Heparin Sodium (Porcine) 1,000 Units/mL Catheter Lock Arterial    HYDROcodone-acetaminophen (NORCO) 5-325 MG  tablet Take 1 tablet by mouth every 6 (six) hours as needed for moderate pain. (Patient not taking: Reported on 12/19/2020)    iron polysaccharides (NIFEREX) 150 MG capsule Take 1 capsule (150 mg total) by mouth daily. (Patient not taking: Reported on 12/19/2020)    iron sucrose in sodium chloride 0.9 % 100 mL Iron Sucrose (Venofer)    lidocaine-prilocaine (EMLA) cream Apply 1 application topically 3 (three) times a week. MWF    predniSONE (STERAPRED UNI-PAK 21 TAB) 10 MG (21) TBPK tablet Take by mouth daily. Take 6 tabs by mouth daily  for 2 days, then 5 tabs for 2 days, then 4 tabs for 2 days, then 3 tabs for 2 days, 2 tabs for 2 days, then 1 tab by mouth daily for 2 days (Patient not taking: Reported on 12/19/2020)    senna-docusate (SENOKOT-S) 8.6-50 MG tablet Take 2 tablets by mouth 2 (two) times daily. (Patient not taking: Reported on 12/19/2020)    thiamine (VITAMIN B-1) 50 MG tablet Take 1 tablet (50 mg total) by mouth daily. (Patient not taking: Reported on 12/19/2020)    No facility-administered encounter medications on file as of 12/19/2020.   Patient Active Problem List   Diagnosis Date Noted   Mild protein-calorie malnutrition ([place]) 10/07/2020   Allergy, unspecified, initial encounter 08/28/2020   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/28/2020   Cardiomyopathy, unspecified ([place]) 45/05/8880   Complication of vascular dialysis catheter 08/28/2020   Other specified coagulation defects ([place]) 08/28/2020   End stage renal disease ([place]) 08/28/2020   Acute pancreatitis 08/22/2020   B12 deficiency 06/08/2020   Gynecomastia, male 06/07/2020   Erectile dysfunction 06/07/2020   Hand cramp 06/07/2020   Aortic atherosclerosis ([place]) 06/07/2020   Anemia due to acquired thiamine deficiency 80/05/4915   Umbilical hernia without obstruction and without gangrene 03/18/2020   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   Deficiency anemia 03/18/2020   Hyperlipidemia LDL goal <130 03/18/2020   Upper back pain on  left side 02/06/2020   History of TB (tuberculosis) 10/30/2019   Sinusitis 10/16/2019   Allergic rhinitis 10/16/2019   CAD (coronary artery disease) 09/07/2019   Nephrosclerosis 09/07/2019   Obesity (BMI 30-39.9) 09/07/2019   Secondary hyperparathyroidism ([place]) 09/07/2019   CKD (chronic kidney disease), stage V ([place]) 06/22/2019   Obesity, diabetes, and hypertension syndrome ([place]) 06/22/2019   Ulnar neuritis, right 04/25/2019   Muscle cramps 02/26/2019   Abnormal TSH 02/26/2019   Vitamin D deficiency 02/22/2019   Iron deficiency 02/22/2019   Chest pain 11/01/2018   Laryngopharyngeal reflux (LPR) 10/04/2018   Post-nasal drainage 10/04/2018   Whiplash injuries, initial encounter 09/21/2018   Degenerative arthritis of right knee 09/21/2018   Chronic diastolic HF (heart failure) ([place]) 07/04/2018   Intractable post-traumatic headache 06/16/2018   Low back pain 06/15/2018   Neck pain 91/50/5697   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   Thyroid nodule 01/22/2017   Ptosis 94/80/1655   Chronic systolic heart failure ([place]) 08/01/2015   GERD (gastroesophageal reflux disease) 07/29/2015   Osteoarthritis 05/03/2015   Hyperlipidemia 37/48/2707   Diastolic dysfunction 86/75/4492   Tobacco abuse 02/26/2015  OSA (obstructive sleep apnea) 12/27/2014   Encounter for well adult exam with abnormal findings 11/12/2014   Anemia in chronic kidney disease 11/12/2014   Left knee pain 09/22/2014   Paresthesia of left leg 27/09/8673   Acute systolic CHF (congestive heart failure) ([place]) 11/17/2013   Morbid obesity ([place]) 11/17/2013   Hyperglycemia 12/12/2011   Acute on chronic renal failure ([place]) 12/11/2011   HTN (hypertension) 12/11/2011   SOB (shortness of breath) 12/11/2011   Gout 12/31/2006   HERNIATED LUMBAR DISK WITH RADICULOPATHY 12/30/2006   Conditions to be addressed/monitored:  CHF and ESRD  Care Plan : RN Care Manager Plan of Care  Updates made by Knox Royalty, RN since  12/19/2020 12:00 AM     Problem: Chronic Disease Management Needs   Priority: Medium     Long-Range Goal: Development of plan of care for long term chronic disease management   Start Date: 12/19/2020  Expected End Date: 12/19/2021  Priority: Medium  Note:   Current Barriers:  Chronic Disease Management support and education needs related to CHF and ESRD New to hemodialysis; actively pursuing kidney transplant through [facility]):  Patient will demonstrate ongoing health management independence ESRD- on hemodialysis/ CHF  through collaboration with RN Care manager, provider, and care team.   Interventions: 1:1 collaboration with primary care provider regarding development and update of comprehensive plan of care as evidenced by provider attestation and co-signature Inter-disciplinary care team collaboration (see longitudinal plan of care) Evaluation of current treatment plan related to  self management and patient's adherence to plan as established by provider  Heart Failure Interventions:  (Status: New goal.) Basic overview and discussion of pathophysiology of Heart Failure reviewed; Provided education on low sodium diet; Discussed the importance of keeping all appointments with provider; Provided patient with education about the role of exercise in the management of heart failure; Screening for signs and symptoms of depression related to chronic disease state;  Assessed social determinant of health barriers;  Medication review completed and medication list in EHR updated according to patient report: patient reports good general understanding of the purpose, dosing, and scheduling of prescribed medications: no concerns or discrepancies identified as a result of medication review today Discussed with patient and provided verbal education around safe use of NTG at home, action plan for unresolved chest pain/ pressure Confirmed patient follows heart  healthy, low salt, renal diet Confirmed patient exercises/ is active- reports walks daily: positive reinforcement provided Discussed daily weight management at home: patient weighs self regularly at home, and also weighs weekly tid at hemodialysis sessions; reports general weight ranges at home currently between 251-255 lbs Positive reinforcement provided for reported weight loss: reports has lost > 100 lbs over 18 months to qualify for kidney transplant Confirmed patient has good baseline understanding of signs/ symptoms yellow CHF zone, along with corresponding action plan Reviewed upcoming scheduled provider appointments and confirmed patient has plans to attend as scheduled: 01/24/21-- cardiology provider [facility]) Discussed need to obtain flu vaccine for 2022-23 flu season: he reports he does not get flu vaccines due to adverse effect from same "years ago;" states was in hospital for 7 days after receiving flu vaccine in the past  Chronic Kidney Disease (Status: New goal.)  Last practice recorded BP readings:  BP Readings from Last 3 Encounters:  12/12/20 130/62  12/01/20 (!) 152/91  11/13/20 (!) 148/66  Most recent eGFR/CrCl: No results found for: EGFR  No components found for: CRCL  Evaluation  of current treatment plan related to chronic kidney disease self management and patient's adherence to plan as established by provider      Reviewed prescribed diet renal/ heart healthy, low salt: confirmed he has has formal diet/ nutrition education for renal diet provided at hemodialysis center Reviewed medications with patient and discussed importance of compliance    Counseled on the importance of exercise goals with target of 150 minutes per week     Discussed plans with patient for ongoing care management follow up and provided patient with direct contact information for care management team    Discussed the impact of chronic kidney disease on daily life and mental health and acknowledged and  normalized feelings of disempowerment, fear, and frustration    Provided education on kidney disease progression    Engage patient in early, proactive and ongoing discussion about goals of care and what matters most to them    Discussed general plan for upcoming renal/ kidney transplant: reports has appointment "next week" with [facility] transplant team; hopes to be placed on transplant list "soon" Discussed patient's toleration of newly started hemodialysis: reports has tolerated well thus far, denies problems, concerns Discussed patient's daily fluid restriction guidelines: 3200 cc/d per patient report- reports he is following fluid restrictions according to instructions Confirmed patient has been adherent to hemodialysis schedule: has not missed any HD sessions, attending M-W-F in "mid-afternoon" hours  Patient Goals/Self-Care Activities: Patient will self administer medications as prescribed as evidenced by self report/primary caregiver report  Patient will attend all scheduled provider appointments as evidenced by clinician review of documented attendance to scheduled appointments and patient/caregiver report Patient will call pharmacy for medication refills as evidenced by patient report and review of pharmacy fill history as appropriate Patient will call provider office for new concerns or questions as evidenced by review of documented incoming telephone call notes and patient report Patient will continue to follow heart healthy, low salt, renal diet Patient will continue to attend hemodialysis sessions M-W-F as scheduled Patient will continue to stay active and walk for exercise daily       Plan: Telephone follow up appointment with care management team member scheduled for:  Thursday, March 20, 2021 at 9:00 am The patient has been provided with contact information for the care management team and has been advised to call with any health related questions or  concerns  Oneta Rack, RN, BSN, [place] [phone]: direct office [phone]: mobile   Medical screening examination/treatment/procedure(s) were performed by non-physician practitioner and as supervising physician I was immediately available for consultation/collaboration.  I agree with above. Cathlean Cower, MD

## 2020-12-20 DIAGNOSIS — I5022 Chronic systolic (congestive) heart failure: Secondary | ICD-10-CM | POA: Diagnosis not present

## 2020-12-20 DIAGNOSIS — Z992 Dependence on renal dialysis: Secondary | ICD-10-CM | POA: Diagnosis not present

## 2020-12-20 DIAGNOSIS — I5032 Chronic diastolic (congestive) heart failure: Secondary | ICD-10-CM

## 2020-12-20 DIAGNOSIS — N186 End stage renal disease: Secondary | ICD-10-CM | POA: Diagnosis not present

## 2020-12-20 DIAGNOSIS — N185 Chronic kidney disease, stage 5: Secondary | ICD-10-CM | POA: Diagnosis not present

## 2020-12-20 DIAGNOSIS — N2581 Secondary hyperparathyroidism of renal origin: Secondary | ICD-10-CM | POA: Diagnosis not present

## 2020-12-20 DIAGNOSIS — Z452 Encounter for adjustment and management of vascular access device: Secondary | ICD-10-CM | POA: Diagnosis not present

## 2020-12-23 DIAGNOSIS — N186 End stage renal disease: Secondary | ICD-10-CM | POA: Diagnosis not present

## 2020-12-23 DIAGNOSIS — N2581 Secondary hyperparathyroidism of renal origin: Secondary | ICD-10-CM | POA: Diagnosis not present

## 2020-12-23 DIAGNOSIS — Z992 Dependence on renal dialysis: Secondary | ICD-10-CM | POA: Diagnosis not present

## 2020-12-25 DIAGNOSIS — N186 End stage renal disease: Secondary | ICD-10-CM | POA: Diagnosis not present

## 2020-12-25 DIAGNOSIS — Z992 Dependence on renal dialysis: Secondary | ICD-10-CM | POA: Diagnosis not present

## 2020-12-25 DIAGNOSIS — N2581 Secondary hyperparathyroidism of renal origin: Secondary | ICD-10-CM | POA: Diagnosis not present

## 2020-12-27 DIAGNOSIS — N186 End stage renal disease: Secondary | ICD-10-CM | POA: Diagnosis not present

## 2020-12-27 DIAGNOSIS — N2581 Secondary hyperparathyroidism of renal origin: Secondary | ICD-10-CM | POA: Diagnosis not present

## 2020-12-27 DIAGNOSIS — Z992 Dependence on renal dialysis: Secondary | ICD-10-CM | POA: Diagnosis not present

## 2020-12-30 DIAGNOSIS — Z992 Dependence on renal dialysis: Secondary | ICD-10-CM | POA: Diagnosis not present

## 2020-12-30 DIAGNOSIS — N2581 Secondary hyperparathyroidism of renal origin: Secondary | ICD-10-CM | POA: Diagnosis not present

## 2020-12-30 DIAGNOSIS — N186 End stage renal disease: Secondary | ICD-10-CM | POA: Diagnosis not present

## 2021-01-01 DIAGNOSIS — N2581 Secondary hyperparathyroidism of renal origin: Secondary | ICD-10-CM | POA: Diagnosis not present

## 2021-01-01 DIAGNOSIS — N186 End stage renal disease: Secondary | ICD-10-CM | POA: Diagnosis not present

## 2021-01-01 DIAGNOSIS — Z992 Dependence on renal dialysis: Secondary | ICD-10-CM | POA: Diagnosis not present

## 2021-01-03 DIAGNOSIS — Z992 Dependence on renal dialysis: Secondary | ICD-10-CM | POA: Diagnosis not present

## 2021-01-03 DIAGNOSIS — N2581 Secondary hyperparathyroidism of renal origin: Secondary | ICD-10-CM | POA: Diagnosis not present

## 2021-01-03 DIAGNOSIS — N186 End stage renal disease: Secondary | ICD-10-CM | POA: Diagnosis not present

## 2021-01-06 DIAGNOSIS — Z992 Dependence on renal dialysis: Secondary | ICD-10-CM | POA: Diagnosis not present

## 2021-01-06 DIAGNOSIS — N186 End stage renal disease: Secondary | ICD-10-CM | POA: Diagnosis not present

## 2021-01-06 DIAGNOSIS — N2581 Secondary hyperparathyroidism of renal origin: Secondary | ICD-10-CM | POA: Diagnosis not present

## 2021-01-08 DIAGNOSIS — N2581 Secondary hyperparathyroidism of renal origin: Secondary | ICD-10-CM | POA: Diagnosis not present

## 2021-01-08 DIAGNOSIS — N186 End stage renal disease: Secondary | ICD-10-CM | POA: Diagnosis not present

## 2021-01-08 DIAGNOSIS — Z992 Dependence on renal dialysis: Secondary | ICD-10-CM | POA: Diagnosis not present

## 2021-01-10 ENCOUNTER — Telehealth: Payer: Self-pay | Admitting: Internal Medicine

## 2021-01-10 DIAGNOSIS — N2581 Secondary hyperparathyroidism of renal origin: Secondary | ICD-10-CM | POA: Diagnosis not present

## 2021-01-10 DIAGNOSIS — N186 End stage renal disease: Secondary | ICD-10-CM | POA: Diagnosis not present

## 2021-01-10 DIAGNOSIS — Z992 Dependence on renal dialysis: Secondary | ICD-10-CM | POA: Diagnosis not present

## 2021-01-10 DIAGNOSIS — E1122 Type 2 diabetes mellitus with diabetic chronic kidney disease: Secondary | ICD-10-CM

## 2021-01-10 DIAGNOSIS — M79673 Pain in unspecified foot: Secondary | ICD-10-CM

## 2021-01-10 NOTE — Telephone Encounter (Signed)
Patient notified

## 2021-01-10 NOTE — Telephone Encounter (Signed)
Ok referral done 

## 2021-01-10 NOTE — Telephone Encounter (Signed)
Pt. Has called and is requesting a referral to be seen at [facility]. States that he would like the referral ASAP. Informed pt. That he may have to schedule appt. With provider before getting the referral. Pt. States he shouldn't have to schedule an appt.   Please advise.   Callback #- [phone]

## 2021-01-13 DIAGNOSIS — Z992 Dependence on renal dialysis: Secondary | ICD-10-CM | POA: Diagnosis not present

## 2021-01-13 DIAGNOSIS — N2581 Secondary hyperparathyroidism of renal origin: Secondary | ICD-10-CM | POA: Diagnosis not present

## 2021-01-13 DIAGNOSIS — R52 Pain, unspecified: Secondary | ICD-10-CM | POA: Insufficient documentation

## 2021-01-13 DIAGNOSIS — N186 End stage renal disease: Secondary | ICD-10-CM | POA: Diagnosis not present

## 2021-01-15 DIAGNOSIS — N186 End stage renal disease: Secondary | ICD-10-CM | POA: Diagnosis not present

## 2021-01-15 DIAGNOSIS — N2581 Secondary hyperparathyroidism of renal origin: Secondary | ICD-10-CM | POA: Diagnosis not present

## 2021-01-15 DIAGNOSIS — Z992 Dependence on renal dialysis: Secondary | ICD-10-CM | POA: Diagnosis not present

## 2021-01-17 ENCOUNTER — Other Ambulatory Visit: Payer: Self-pay

## 2021-01-17 DIAGNOSIS — Z992 Dependence on renal dialysis: Secondary | ICD-10-CM | POA: Diagnosis not present

## 2021-01-17 DIAGNOSIS — N186 End stage renal disease: Secondary | ICD-10-CM | POA: Diagnosis not present

## 2021-01-17 DIAGNOSIS — N2581 Secondary hyperparathyroidism of renal origin: Secondary | ICD-10-CM | POA: Diagnosis not present

## 2021-01-17 MED ORDER — PANTOPRAZOLE SODIUM 40 MG PO TBEC: 40.0000 mg | DELAYED_RELEASE_TABLET | Freq: Two times a day (BID) | ORAL | 3 refills | Status: DC

## 2021-01-17 NOTE — Progress Notes (Signed)
Refilled medication

## 2021-01-20 DIAGNOSIS — N186 End stage renal disease: Secondary | ICD-10-CM | POA: Diagnosis not present

## 2021-01-20 DIAGNOSIS — I129 Hypertensive chronic kidney disease with stage 1 through stage 4 chronic kidney disease, or unspecified chronic kidney disease: Secondary | ICD-10-CM | POA: Diagnosis not present

## 2021-01-20 DIAGNOSIS — N2581 Secondary hyperparathyroidism of renal origin: Secondary | ICD-10-CM | POA: Diagnosis not present

## 2021-01-20 DIAGNOSIS — Z992 Dependence on renal dialysis: Secondary | ICD-10-CM | POA: Diagnosis not present

## 2021-01-22 DIAGNOSIS — Z992 Dependence on renal dialysis: Secondary | ICD-10-CM | POA: Diagnosis not present

## 2021-01-22 DIAGNOSIS — N2581 Secondary hyperparathyroidism of renal origin: Secondary | ICD-10-CM | POA: Diagnosis not present

## 2021-01-22 DIAGNOSIS — N186 End stage renal disease: Secondary | ICD-10-CM | POA: Diagnosis not present

## 2021-01-22 NOTE — Progress Notes (Signed)
Cardiology Office Note   Date:  01/24/2021   ID:  Craig Flynn, DOB [birth year], MRN [id number]  PCP:  Biagio Borg, MD  Cardiologist:   Minus Breeding, MD   Chief Complaint  Patient presents with   Chest Pain      History of Present Illness: Craig Flynn is a 60 y.o. male who presents for evaluation of cardiomyopathy and hypertension. The patient has long-standing hypertension. He was seen a couple of years ago with mildly reduced ejection fraction of about 45%. In July 2016 his EF was said to be about 55%.    Perfusion  study in 2017 demonstrated EF of 47% with a fixed inferior wall defect.   I sent him for a POET (Plain Old Exercise Treadmill) which was negative but he had a suboptimal heart rate response.   This was May of last year.  Echo in April of 2021 demonstrated an EF of 60 to 65%.  He was in the emergency room in September for chest pain.  I reviewed these records.  He had a flat troponin that was minimally elevated.  It was thought to have costochondritis and was treated with prednisone taper along with topical nonsteroidals.  Since I last saw him he has continued to have the sharp shooting chest discomfort sporadically in his mid chest.  He feels like there is are not.  He did not really improve with steroid taper or nonsteroidal cream.  However, its not reproducible with exertion.  He is not describing neck discomfort or arm discomfort but has had no new shortness of breath, PND or orthopnea.  He walks a mile a day with his son and is not a started golfing soon.  Past Medical History:  Diagnosis Date   Anemia 06/2015   Arthritis    knee   Cancer ([place])    thyroid cancer   Cardiomyopathy ([place])    a. 10/2013: EF reduced to 35-40% b. 09/2014: EF improved to 55-60%, Grade 1 DD noted.   Chest pain 06/2015   CHF (congestive heart failure) (HCC)    CKD (chronic kidney disease), stage IV [facility])    sees  Dr Lorrene Reid   Dyspnea    with exerion    GERD (gastroesophageal  reflux disease)    Gout    Hypertension    Obesity    Pancreatitis    Sleep apnea    not wearing c-pap now, needs new slep study per pt. (01/22/2017)   Tuberculosis 1981   while the pt was in the service.   Tubular adenoma of colon 10/2015    Past Surgical History:  Procedure Laterality Date   AV FISTULA PLACEMENT Left 03/25/2016   Procedure: Creation of Left Arm ARTERIOVENOUS (AV) FISTULA;  Surgeon: Elam Dutch, MD;  Location: [facility];  Service: Vascular;  Laterality: Left;   AV FISTULA PLACEMENT Left 08/27/2020   Procedure: LEFT BASILIC VEIN ARTERIOVENOUS (AV) FISTULA CREATION;  Surgeon: Rosetta Posner, MD;  Location: [place];  Service: Vascular;  Laterality: Left;   [place] Left 10/22/2020   Procedure: LEFT SECOND STAGE BASILIC VEIN TRANSPOSITION;  Surgeon: Elam Dutch, MD;  Location: [facility];  Service: Vascular;  Laterality: Left;   COLONOSCOPY W/ BIOPSIES AND POLYPECTOMY     "no problem"   HERNIA REPAIR     IR FLUORO GUIDE CV LINE RIGHT  08/22/2020   IR US GUIDE VASC ACCESS RIGHT  08/22/2020   LAPAROSCOPIC CHOLECYSTECTOMY  LIGATION OF COMPETING BRANCHES OF ARTERIOVENOUS FISTULA Left 01/25/2018   Procedure: LIGATION OF COMPETING BRANCHES And Revision of ARTERIOVENOUS FISTULA LEFT ARM.;  Surgeon: Elam Dutch, MD;  Location: [facility];  Service: Vascular;  Laterality: Left;   THYROIDECTOMY  01/22/2017   THYROIDECTOMY Left 01/22/2017   Procedure: THYROIDECTOMY;  Surgeon: Melida Quitter, MD;  Location: [place];  Service: ENT;  Laterality: Left;   THYROIDECTOMY Right 03/26/2017   Procedure: COMPLETION OF THYROIDECTOMY;  Surgeon: Melida Quitter, MD;  Location: [facility];  Service: ENT;  Laterality: Right;   UMBILICAL HERNIA REPAIR     WISDOM TOOTH EXTRACTION       Current Outpatient Medications  Medication Sig Dispense Refill   acetaminophen (TYLENOL) 500 MG tablet Take 2 tablets (1,000 mg total) by mouth every 8 (eight) hours as needed for mild pain or headache. 60 tablet  0   acetaminophen (TYLENOL) 500 MG tablet Take 1,000 mg by mouth every 6 (six) hours as needed for moderate pain.     albuterol (VENTOLIN HFA) 108 (90 Base) MCG/ACT inhaler INHALE 2 PUFFS INTO THE LUNGS EVERY 6 HOURS AS NEEDED FOR WHEEZING OR SHORTNESS OF BREATH (Patient taking differently: Inhale 2 puffs into the lungs every 6 (six) hours as needed for wheezing or shortness of breath.) 18 g 5   aspirin EC 81 MG tablet Take 81 mg by mouth daily.     cyclobenzaprine (FLEXERIL) 10 MG tablet Take 1 tablet (10 mg total) by mouth 2 (two) times daily as needed for muscle spasms. 20 tablet 0   diclofenac Sodium (VOLTAREN) 1 % GEL Apply 2 g topically 4 (four) times daily. 100 g 0   fenofibrate 54 MG tablet TAKE 1 TABLET BY MOUTH EVERY DAY 60 tablet 0   HYDROcodone-acetaminophen (NORCO) 5-325 MG tablet Take 1 tablet by mouth every 6 (six) hours as needed for moderate pain. 20 tablet 0   iron polysaccharides (NIFEREX) 150 MG capsule Take 1 capsule (150 mg total) by mouth daily. 30 capsule 0   iron sucrose in sodium chloride 0.9 % 100 mL Iron Sucrose (Venofer)     isosorbide mononitrate (IMDUR) 30 MG 24 hr tablet TAKE 2 TABLETS(60 MG) BY MOUTH DAILY (Patient taking differently: Take 30 mg by mouth in the morning and at bedtime.) 60 tablet 1   levothyroxine (SYNTHROID) 137 MCG tablet Take 137 mcg by mouth every morning.     lidocaine-prilocaine (EMLA) cream Apply 1 application topically 3 (three) times a week. MWF     nitroGLYCERIN (NITROSTAT) 0.4 MG SL tablet DISSOLVE 1 TABLET UNDER THE TONGUE EVERY 5 MINUTES AS NEEDED FOR CHEST PAIN (Patient taking differently: Place 0.4 mg under the tongue every 5 (five) minutes as needed for chest pain.) 25 tablet 3   pantoprazole (PROTONIX) 40 MG tablet Take 1 tablet (40 mg total) by mouth 2 (two) times daily before a meal. 180 tablet 3   polyethylene glycol (MIRALAX / GLYCOLAX) 17 g packet Take 17 g by mouth 2 (two) times daily. (Patient taking differently: Take 17 g by  mouth daily as needed for moderate constipation.) 14 each 0   Potassium Chloride ER 20 MEQ TBCR Take 1 tablet by mouth daily.     predniSONE (STERAPRED UNI-PAK 21 TAB) 10 MG (21) TBPK tablet Take by mouth daily. Take 6 tabs by mouth daily  for 2 days, then 5 tabs for 2 days, then 4 tabs for 2 days, then 3 tabs for 2 days, 2 tabs for 2 days, then 1  tab by mouth daily for 2 days (Patient taking differently: Take by mouth daily. Take 6 tabs by mouth daily  for 2 days, then 5 tabs for 2 days, then 4 tabs for 2 days, then 3 tabs for 2 days, 2 tabs for 2 days, then 1 tab by mouth daily for 2 days) 42 tablet 0   senna-docusate (SENOKOT-S) 8.6-50 MG tablet Take 2 tablets by mouth 2 (two) times daily. 60 tablet 0   thiamine (VITAMIN B-1) 50 MG tablet Take 1 tablet (50 mg total) by mouth daily. 90 tablet 1   vitamin B-12 (CYANOCOBALAMIN) 1000 MCG tablet Take 1,000 mcg by mouth daily.     carvedilol (COREG) 25 MG tablet TAKE 1 TABLET(25 MG) BY MOUTH TWICE DAILY (Patient not taking: Reported on 01/24/2021) 180 tablet 2   No current facility-administered medications for this visit.    Allergies:   Patient has no known allergies.   ROS:  Please see the history of present illness.   Otherwise, review of systems are positive for none.   All other systems are reviewed and negative.    PHYSICAL EXAM: VS:  BP 140/70   Pulse 74   Ht 6\' 2"  (1.88 m)   Wt 260 lb 3.2 oz (118 kg)   SpO2 96%   BMI 33.41 kg/m  , BMI Body mass index is 33.41 kg/m.  GENERAL:  Well appearing NECK:  No jugular venous distention, waveform within normal limits, carotid upstroke brisk and symmetric, no bruits, no thyromegaly LUNGS:  Clear to auscultation bilaterally CHEST:  Unremarkable HEART:  PMI not displaced or sustained,S1 and S2 within normal limits, no S3, no S4, no clicks, no rubs, no murmurs ABD:  Flat, positive bowel sounds normal in frequency in pitch, no bruits, no rebound, no guarding, no midline pulsatile mass, no  hepatomegaly, no splenomegaly EXT:  2 plus pulses throughout, no edema, no cyanosis no clubbing, left arm fistula and bruit  EKG:  EKG is  ordered today. Sinus rhythm, rate 74, axis within normal limits, intervals within normal limits, no acute ST-T wave changes.  Recent Labs: 06/07/2020: TSH 0.50 08/22/2020: Magnesium 2.0 08/26/2020: ALT 10 12/01/2020: BUN 32; Creatinine, Ser 5.70; Hemoglobin 11.9; Platelets 147; Potassium 2.8; Sodium 138    Lipid Panel    Component Value Date/Time   CHOL 132 06/07/2020 1203   TRIG 162 (H) 08/21/2020 2241   TRIG 98 03/03/2006 0910   HDL 30.40 (L) 06/07/2020 1203   CHOLHDL 4 06/07/2020 1203   VLDL 29.4 06/07/2020 1203   LDLCALC 72 06/07/2020 1203      Wt Readings from Last 3 Encounters:  01/24/21 260 lb 3.2 oz (118 kg)  12/12/20 261 lb (118.4 kg)  11/13/20 260 lb 6.4 oz (118.1 kg)      Other studies Reviewed: Additional studies/ records that were reviewed today include: Labs Review of the above records demonstrates: See elsewhere   ASSESSMENT AND PLAN:   CARDIOMYOPATHY:   EF was normal in April 2021.  No change in therapy or further imaging is indicated.  HTN:   Blood pressure is at target.  No change in therapy.   OBESITY: He has maintained his weight loss and I encouraged more of the same.   CKD:    He tolerates dialysis.   CHEST PAIN:    This is nonanginal.  No further cardiac work-up.    Current medicines are reviewed at length with the patient today.  The patient does not have concerns regarding medicines.  The following changes have Labs/ tests ordered today include:  None  Orders Placed This Encounter  Procedures   EKG 12-Lead      Disposition:   FU with me in 1 year   Signed, Minus Breeding, MD  01/24/2021 9:17 AM    [place]

## 2021-01-24 ENCOUNTER — Encounter: Payer: Self-pay | Admitting: Cardiology

## 2021-01-24 ENCOUNTER — Ambulatory Visit (INDEPENDENT_AMBULATORY_CARE_PROVIDER_SITE_OTHER): Payer: Medicare HMO | Admitting: Cardiology

## 2021-01-24 ENCOUNTER — Other Ambulatory Visit: Payer: Self-pay

## 2021-01-24 VITALS — BP 140/70 | HR 74 | Ht 74.0 in | Wt 260.2 lb

## 2021-01-24 DIAGNOSIS — Z992 Dependence on renal dialysis: Secondary | ICD-10-CM | POA: Diagnosis not present

## 2021-01-24 DIAGNOSIS — N185 Chronic kidney disease, stage 5: Secondary | ICD-10-CM | POA: Diagnosis not present

## 2021-01-24 DIAGNOSIS — I5032 Chronic diastolic (congestive) heart failure: Secondary | ICD-10-CM

## 2021-01-24 DIAGNOSIS — N2581 Secondary hyperparathyroidism of renal origin: Secondary | ICD-10-CM | POA: Diagnosis not present

## 2021-01-24 DIAGNOSIS — I10 Essential (primary) hypertension: Secondary | ICD-10-CM

## 2021-01-24 DIAGNOSIS — N186 End stage renal disease: Secondary | ICD-10-CM | POA: Diagnosis not present

## 2021-01-24 DIAGNOSIS — E890 Postprocedural hypothyroidism: Secondary | ICD-10-CM | POA: Diagnosis not present

## 2021-01-24 NOTE — Patient Instructions (Signed)
Medication Instructions:  °Your physician recommends that you continue on your current medications as directed. Please refer to the Current Medication list given to you today. ° °*If you need a refill on your cardiac medications before your next appointment, please call your pharmacy* ° °Lab Work: °NONE ordered at this time of appointment  ° °If you have labs (blood work) drawn today and your tests are completely normal, you will receive your results only by: °MyChart Message (if you have MyChart) OR °A paper copy in the mail °If you have any lab test that is abnormal or we need to change your treatment, we will call you to review the results. ° °Testing/Procedures: °NONE ordered at this time of appointment  ° °Follow-Up: °At [facility], you and your health needs are our priority.  As part of our continuing mission to provide you with exceptional heart care, we have created designated Provider Care Teams.  These Care Teams include your primary Cardiologist (physician) and Advanced Practice Providers (APPs -  Physician Assistants and Nurse Practitioners) who all work together to provide you with the care you need, when you need it. ° °We recommend signing up for the patient portal called "MyChart".  Sign up information is provided on this After Visit Summary.  MyChart is used to connect with patients for Virtual Visits (Telemedicine).  Patients are able to view lab/test results, encounter notes, upcoming appointments, etc.  Non-urgent messages can be sent to your provider as well.   °To learn more about what you can do with MyChart, go to [email or web address].   ° °Your next appointment:   °1 year(s) ° °The format for your next appointment:   °In Person ° °Provider:   °Mounce Hochrein, MD   ° °Other Instructions ° °

## 2021-01-27 DIAGNOSIS — N186 End stage renal disease: Secondary | ICD-10-CM | POA: Diagnosis not present

## 2021-01-27 DIAGNOSIS — Z992 Dependence on renal dialysis: Secondary | ICD-10-CM | POA: Diagnosis not present

## 2021-01-27 DIAGNOSIS — N2581 Secondary hyperparathyroidism of renal origin: Secondary | ICD-10-CM | POA: Diagnosis not present

## 2021-01-28 ENCOUNTER — Encounter: Payer: Self-pay | Admitting: Family Medicine

## 2021-01-28 ENCOUNTER — Other Ambulatory Visit: Payer: Self-pay | Admitting: Endocrinology

## 2021-01-28 ENCOUNTER — Ambulatory Visit: Payer: BLUE CROSS/BLUE SHIELD | Admitting: Podiatry

## 2021-01-28 ENCOUNTER — Telehealth (INDEPENDENT_AMBULATORY_CARE_PROVIDER_SITE_OTHER): Payer: Medicare HMO | Admitting: Family Medicine

## 2021-01-28 VITALS — BP 122/76 | Temp 97.1°F | Wt 258.0 lb

## 2021-01-28 DIAGNOSIS — E890 Postprocedural hypothyroidism: Secondary | ICD-10-CM | POA: Diagnosis not present

## 2021-01-28 DIAGNOSIS — C73 Malignant neoplasm of thyroid gland: Secondary | ICD-10-CM | POA: Diagnosis not present

## 2021-01-28 DIAGNOSIS — N186 End stage renal disease: Secondary | ICD-10-CM | POA: Diagnosis not present

## 2021-01-28 DIAGNOSIS — R059 Cough, unspecified: Secondary | ICD-10-CM | POA: Diagnosis not present

## 2021-01-28 DIAGNOSIS — I10 Essential (primary) hypertension: Secondary | ICD-10-CM | POA: Diagnosis not present

## 2021-01-28 DIAGNOSIS — Z992 Dependence on renal dialysis: Secondary | ICD-10-CM | POA: Diagnosis not present

## 2021-01-28 DIAGNOSIS — N189 Chronic kidney disease, unspecified: Secondary | ICD-10-CM | POA: Diagnosis not present

## 2021-01-28 MED ORDER — BENZONATATE 100 MG PO CAPS: ORAL_CAPSULE | ORAL | 0 refills | Status: DC

## 2021-01-28 NOTE — Patient Instructions (Addendum)
-  I sent the medication(s) we discussed to your pharmacy: Meds ordered this encounter  Medications   benzonatate (TESSALON PERLES) 100 MG capsule    Sig: 1-2 capsule twice daily as needed for cough    Dispense:  40 capsule    Refill:  0   Humidifier at night - make sure to clean properly  Nasal congestion twice daily can help if nasal congestion  I hope you are feeling better soon!  Seek follow up or in person care promptly if your symptoms worsen, new concerns arise or you are not improving with treatment.  It was nice to meet you today. I help [facility] out with telemedicine visits on Tuesdays and Thursdays and am available for visits on those days. If you have any concerns or questions following this visit please schedule a follow up visit with your Primary Care doctor or seek care at a local urgent care clinic to avoid delays in care.

## 2021-01-28 NOTE — Progress Notes (Signed)
Virtual Visit via Video Note  I connected with Craig Flynn  on 01/28/21 at  5:20 PM EST by a video enabled telemedicine application and verified that I am speaking with the correct person using two identifiers.  Location patient: home, [place] Location provider:work or home office Persons participating in the virtual visit: patient, provider  I discussed the limitations of evaluation and management by telemedicine and the availability of in person appointments. The patient expressed understanding and agreed to proceed.   HPI:  Acute telemedicine visit for a cough: -Onset: about 1 week ago -Symptoms include: nasal congestion, cough - cough has lingered some -Denies: fevers, CP, SOB, body aches, NVD, discolored congestion/drainage (reports is clear), sinus discomfort or pain, HA -Has tried: Tylenol sinus -Pertinent past medical history: see below -Pertinent medication allergies: No Known Allergies -COVID-19 vaccine status: Immunization History  Administered Date(s) Administered   Influenza Whole 01/22/2008   PFIZER Comirnaty(Gray Top)Covid-19 Tri-Sucrose Vaccine 06/24/2019, 07/18/2019, 05/02/2020   PFIZER(Purple Top)SARS-COV-2 Vaccination 06/24/2019, 07/18/2019, 05/02/2020   Pneumococcal Polysaccharide-23 01/22/2008     ROS: See pertinent positives and negatives per HPI.  Past Medical History:  Diagnosis Date   Anemia 06/2015   Arthritis    knee   Cancer ([place])    thyroid cancer   Cardiomyopathy ([place])    a. 10/2013: EF reduced to 35-40% b. 09/2014: EF improved to 55-60%, Grade 1 DD noted.   Chest pain 06/2015   CHF (congestive heart failure) (HCC)    CKD (chronic kidney disease), stage IV [facility])    sees  Dr Lorrene Reid   Dyspnea    with exerion    GERD (gastroesophageal reflux disease)    Gout    Hypertension    Obesity    Pancreatitis    Sleep apnea    not wearing c-pap now, needs new slep study per pt. (01/22/2017)   Tuberculosis 1981   while the pt was in the service.   Tubular  adenoma of colon 10/2015    Past Surgical History:  Procedure Laterality Date   AV FISTULA PLACEMENT Left 03/25/2016   Procedure: Creation of Left Arm ARTERIOVENOUS (AV) FISTULA;  Surgeon: Elam Dutch, MD;  Location: [place];  Service: Vascular;  Laterality: Left;   AV FISTULA PLACEMENT Left 08/27/2020   Procedure: LEFT BASILIC VEIN ARTERIOVENOUS (AV) FISTULA CREATION;  Surgeon: Rosetta Posner, MD;  Location: [place];  Service: Vascular;  Laterality: Left;   [place] Left 10/22/2020   Procedure: LEFT SECOND STAGE BASILIC VEIN TRANSPOSITION;  Surgeon: Elam Dutch, MD;  Location: [place];  Service: Vascular;  Laterality: Left;   COLONOSCOPY W/ BIOPSIES AND POLYPECTOMY     "no problem"   HERNIA REPAIR     IR FLUORO GUIDE CV LINE RIGHT  08/22/2020   IR US GUIDE VASC ACCESS RIGHT  08/22/2020   LAPAROSCOPIC CHOLECYSTECTOMY     LIGATION OF COMPETING BRANCHES OF ARTERIOVENOUS FISTULA Left 01/25/2018   Procedure: LIGATION OF COMPETING BRANCHES And Revision of ARTERIOVENOUS FISTULA LEFT ARM.;  Surgeon: Elam Dutch, MD;  Location: [place];  Service: Vascular;  Laterality: Left;   THYROIDECTOMY  01/22/2017   THYROIDECTOMY Left 01/22/2017   Procedure: THYROIDECTOMY;  Surgeon: Melida Quitter, MD;  Location: [place];  Service: ENT;  Laterality: Left;   THYROIDECTOMY Right 03/26/2017   Procedure: COMPLETION OF THYROIDECTOMY;  Surgeon: Melida Quitter, MD;  Location: [place];  Service: ENT;  Laterality: Right;   UMBILICAL HERNIA REPAIR     WISDOM TOOTH EXTRACTION  Current Outpatient Medications:    acetaminophen (TYLENOL) 500 MG tablet, Take 1,000 mg by mouth every 6 (six) hours as needed for moderate pain., Disp: , Rfl:    albuterol (VENTOLIN HFA) 108 (90 Base) MCG/ACT inhaler, INHALE 2 PUFFS INTO THE LUNGS EVERY 6 HOURS AS NEEDED FOR WHEEZING OR SHORTNESS OF BREATH (Patient taking differently: Inhale 2 puffs into the lungs every 6 (six) hours as needed for wheezing or shortness of  breath.), Disp: 18 g, Rfl: 5   aspirin EC 81 MG tablet, Take 81 mg by mouth daily., Disp: , Rfl:    benzonatate (TESSALON PERLES) 100 MG capsule, 1-2 capsule twice daily as needed for cough, Disp: 40 capsule, Rfl: 0   carvedilol (COREG) 25 MG tablet, TAKE 1 TABLET(25 MG) BY MOUTH TWICE DAILY, Disp: 180 tablet, Rfl: 2   fenofibrate 54 MG tablet, TAKE 1 TABLET BY MOUTH EVERY DAY, Disp: 60 tablet, Rfl: 0   iron polysaccharides (NIFEREX) 150 MG capsule, Take 1 capsule (150 mg total) by mouth daily., Disp: 30 capsule, Rfl: 0   iron sucrose in sodium chloride 0.9 % 100 mL, Iron Sucrose (Venofer), Disp: , Rfl:    isosorbide mononitrate (IMDUR) 30 MG 24 hr tablet, TAKE 2 TABLETS(60 MG) BY MOUTH DAILY (Patient taking differently: Take 30 mg by mouth in the morning and at bedtime.), Disp: 60 tablet, Rfl: 1   lidocaine-prilocaine (EMLA) cream, Apply 1 application topically 3 (three) times a week. MWF, Disp: , Rfl:    nitroGLYCERIN (NITROSTAT) 0.4 MG SL tablet, DISSOLVE 1 TABLET UNDER THE TONGUE EVERY 5 MINUTES AS NEEDED FOR CHEST PAIN (Patient taking differently: Place 0.4 mg under the tongue every 5 (five) minutes as needed for chest pain.), Disp: 25 tablet, Rfl: 3   pantoprazole (PROTONIX) 40 MG tablet, Take 1 tablet (40 mg total) by mouth 2 (two) times daily before a meal., Disp: 180 tablet, Rfl: 3   Potassium Chloride ER 20 MEQ TBCR, Take 1 tablet by mouth daily., Disp: , Rfl:    predniSONE (STERAPRED UNI-PAK 21 TAB) 10 MG (21) TBPK tablet, Take by mouth daily. Take 6 tabs by mouth daily  for 2 days, then 5 tabs for 2 days, then 4 tabs for 2 days, then 3 tabs for 2 days, 2 tabs for 2 days, then 1 tab by mouth daily for 2 days (Patient taking differently: Take by mouth daily. Take 6 tabs by mouth daily  for 2 days, then 5 tabs for 2 days, then 4 tabs for 2 days, then 3 tabs for 2 days, 2 tabs for 2 days, then 1 tab by mouth daily for 2 days), Disp: 42 tablet, Rfl: 0   thiamine (VITAMIN B-1) 50 MG tablet, Take 1  tablet (50 mg total) by mouth daily., Disp: 90 tablet, Rfl: 1   vitamin B-12 (CYANOCOBALAMIN) 1000 MCG tablet, Take 1,000 mcg by mouth daily., Disp: , Rfl:   EXAM:  VITALS per patient if applicable:  GENERAL: alert, oriented, appears well and in no acute distress  HEENT: atraumatic, conjunttiva clear, no obvious abnormalities on inspection of external nose and ears  NECK: normal movements of the head and neck  LUNGS: on inspection no signs of respiratory distress, breathing rate appears normal, no obvious gross SOB, gasping or wheezing  CV: no obvious cyanosis  MS: moves all visible extremities without noticeable abnormality  PSYCH/NEURO: pleasant and cooperative, no obvious depression or anxiety, speech and thought processing grossly intact  ASSESSMENT AND PLAN:  Discussed the following assessment and  plan:  Cough, unspecified type  -we discussed possible serious and likely etiologies, options for evaluation and workup, limitations of telemedicine visit vs in person visit, treatment, treatment risks and precautions. Pt is agreeable to treatment via telemedicine at this moment. Suspect VURI vs other. Opted for symptomatic care with Tessalon Rx for cough, humidifier, nasal saline. Discussed signs and symptoms of developing or worsening condition and precautions.  Advised to seek prompt fpollow up through PCP office/[facility] or in person care if worsening, new symptoms arise, or if is not improving with treatment.    I discussed the assessment and treatment plan with the patient. The patient was provided an opportunity to ask questions and all were answered. The patient agreed with the plan and demonstrated an understanding of the instructions.     Lucretia Kern, DO

## 2021-01-29 DIAGNOSIS — Z992 Dependence on renal dialysis: Secondary | ICD-10-CM | POA: Diagnosis not present

## 2021-01-29 DIAGNOSIS — N2581 Secondary hyperparathyroidism of renal origin: Secondary | ICD-10-CM | POA: Diagnosis not present

## 2021-01-29 DIAGNOSIS — N186 End stage renal disease: Secondary | ICD-10-CM | POA: Diagnosis not present

## 2021-01-31 DIAGNOSIS — Z992 Dependence on renal dialysis: Secondary | ICD-10-CM | POA: Diagnosis not present

## 2021-01-31 DIAGNOSIS — N2581 Secondary hyperparathyroidism of renal origin: Secondary | ICD-10-CM | POA: Diagnosis not present

## 2021-01-31 DIAGNOSIS — N186 End stage renal disease: Secondary | ICD-10-CM | POA: Diagnosis not present

## 2021-02-03 ENCOUNTER — Other Ambulatory Visit: Payer: Self-pay | Admitting: Cardiology

## 2021-02-03 DIAGNOSIS — N2581 Secondary hyperparathyroidism of renal origin: Secondary | ICD-10-CM | POA: Diagnosis not present

## 2021-02-03 DIAGNOSIS — N186 End stage renal disease: Secondary | ICD-10-CM | POA: Diagnosis not present

## 2021-02-03 DIAGNOSIS — Z992 Dependence on renal dialysis: Secondary | ICD-10-CM | POA: Diagnosis not present

## 2021-02-04 ENCOUNTER — Other Ambulatory Visit: Payer: Self-pay | Admitting: Cardiology

## 2021-02-05 DIAGNOSIS — N186 End stage renal disease: Secondary | ICD-10-CM | POA: Diagnosis not present

## 2021-02-05 DIAGNOSIS — Z992 Dependence on renal dialysis: Secondary | ICD-10-CM | POA: Diagnosis not present

## 2021-02-05 DIAGNOSIS — N2581 Secondary hyperparathyroidism of renal origin: Secondary | ICD-10-CM | POA: Diagnosis not present

## 2021-02-06 ENCOUNTER — Other Ambulatory Visit: Payer: Medicare HMO

## 2021-02-07 DIAGNOSIS — N186 End stage renal disease: Secondary | ICD-10-CM | POA: Diagnosis not present

## 2021-02-07 DIAGNOSIS — Z992 Dependence on renal dialysis: Secondary | ICD-10-CM | POA: Diagnosis not present

## 2021-02-07 DIAGNOSIS — N2581 Secondary hyperparathyroidism of renal origin: Secondary | ICD-10-CM | POA: Diagnosis not present

## 2021-02-10 DIAGNOSIS — N2581 Secondary hyperparathyroidism of renal origin: Secondary | ICD-10-CM | POA: Diagnosis not present

## 2021-02-10 DIAGNOSIS — Z992 Dependence on renal dialysis: Secondary | ICD-10-CM | POA: Diagnosis not present

## 2021-02-10 DIAGNOSIS — N186 End stage renal disease: Secondary | ICD-10-CM | POA: Diagnosis not present

## 2021-02-11 ENCOUNTER — Ambulatory Visit: Payer: Medicare HMO | Admitting: Podiatry

## 2021-02-11 ENCOUNTER — Encounter: Payer: Self-pay | Admitting: Podiatry

## 2021-02-11 ENCOUNTER — Other Ambulatory Visit: Payer: Self-pay

## 2021-02-11 DIAGNOSIS — N185 Chronic kidney disease, stage 5: Secondary | ICD-10-CM | POA: Diagnosis not present

## 2021-02-11 DIAGNOSIS — B353 Tinea pedis: Secondary | ICD-10-CM | POA: Diagnosis not present

## 2021-02-11 MED ORDER — KETOCONAZOLE 2 % EX CREA: 1.0000 "application " | TOPICAL_CREAM | Freq: Every day | CUTANEOUS | 2 refills | Status: DC

## 2021-02-11 NOTE — Progress Notes (Signed)
  Subjective:  Patient ID: Craig Flynn, male    DOB: 07/22/1960,   MRN: [id number]  Chief Complaint  Patient presents with   foot exam    Yearly diabetic foot exam. Pt states he has no current symptoms or concerns. A1C 5.5    60 y.o. male presents for a foot exam. Was sent here by his nephrologist to have his feet checked. He is on dialysis and has stage V CKD.  Denies any pain in his feet. Denies burning or tingling. Relates he does have dryness in his feet that lotion is not helping. States he is not a diabetic Relates his last A1c was  5.5 06/07/20 . Denies any other pedal complaints. Denies n/v/f/c.   PCP: Cathlean Cower MD   Past Medical History:  Diagnosis Date   Anemia 06/2015   Arthritis    knee   Cancer ([place])    thyroid cancer   Cardiomyopathy ([place])    a. 10/2013: EF reduced to 35-40% b. 09/2014: EF improved to 55-60%, Grade 1 DD noted.   Chest pain 06/2015   CHF (congestive heart failure) (HCC)    CKD (chronic kidney disease), stage IV [facility])    sees  Dr Lorrene Reid   Dyspnea    with exerion    GERD (gastroesophageal reflux disease)    Gout    Hypertension    Obesity    Pancreatitis    Sleep apnea    not wearing c-pap now, needs new slep study per pt. (01/22/2017)   Tuberculosis 1981   while the pt was in the service.   Tubular adenoma of colon 10/2015    Objective:  Physical Exam: Vascular: DP/PT pulses 2/4 bilateral. CFT <3 seconds. Normal hair growth on digits. No edema.  Skin. No lacerations or abrasions bilateral feet. Scaling and peeling noted circumferentially around the outside and inside of his feet bilateral.  Musculoskeletal: MMT 5/5 bilateral lower extremities in DF, PF, Inversion and Eversion. Deceased ROM in DF of ankle joint.  Neurological: Sensation intact to light touch.   Assessment:   1. Tinea pedis of both feet   2. CKD (chronic kidney disease), stage V ([place])      Plan:  Patient was evaluated and treated and all questions  answered. -Discussed and educated patient on  foot care, especially with regards to the vascular, neurological and musculoskeletal systems.  -Discussed supportive shoes at all times and checking feet regularly.  -Prescription for ketoconazole provided for tinea pedis.  -Answered all patient questions -Patient to return  in 1 year for foot exam.  -Patient advised to call the office if any problems or questions arise in the meantime.   Lorenda Peck, DPM

## 2021-02-12 DIAGNOSIS — N2581 Secondary hyperparathyroidism of renal origin: Secondary | ICD-10-CM | POA: Diagnosis not present

## 2021-02-12 DIAGNOSIS — N186 End stage renal disease: Secondary | ICD-10-CM | POA: Diagnosis not present

## 2021-02-12 DIAGNOSIS — Z992 Dependence on renal dialysis: Secondary | ICD-10-CM | POA: Diagnosis not present

## 2021-02-15 DIAGNOSIS — N186 End stage renal disease: Secondary | ICD-10-CM | POA: Diagnosis not present

## 2021-02-15 DIAGNOSIS — N2581 Secondary hyperparathyroidism of renal origin: Secondary | ICD-10-CM | POA: Diagnosis not present

## 2021-02-15 DIAGNOSIS — Z992 Dependence on renal dialysis: Secondary | ICD-10-CM | POA: Diagnosis not present

## 2021-02-17 DIAGNOSIS — N186 End stage renal disease: Secondary | ICD-10-CM | POA: Diagnosis not present

## 2021-02-17 DIAGNOSIS — Z992 Dependence on renal dialysis: Secondary | ICD-10-CM | POA: Diagnosis not present

## 2021-02-17 DIAGNOSIS — N2581 Secondary hyperparathyroidism of renal origin: Secondary | ICD-10-CM | POA: Diagnosis not present

## 2021-02-19 ENCOUNTER — Ambulatory Visit
Admission: RE | Admit: 2021-02-19 | Discharge: 2021-02-19 | Disposition: A | Discharge status: Home / Self Care | Payer: Medicare HMO | Source: Ambulatory Visit | Attending: Endocrinology | Admitting: Endocrinology

## 2021-02-19 DIAGNOSIS — E890 Postprocedural hypothyroidism: Secondary | ICD-10-CM | POA: Diagnosis not present

## 2021-02-19 DIAGNOSIS — N2581 Secondary hyperparathyroidism of renal origin: Secondary | ICD-10-CM | POA: Diagnosis not present

## 2021-02-19 DIAGNOSIS — N186 End stage renal disease: Secondary | ICD-10-CM | POA: Diagnosis not present

## 2021-02-19 DIAGNOSIS — Z85850 Personal history of malignant neoplasm of thyroid: Secondary | ICD-10-CM | POA: Diagnosis not present

## 2021-02-19 DIAGNOSIS — Z992 Dependence on renal dialysis: Secondary | ICD-10-CM | POA: Diagnosis not present

## 2021-02-19 DIAGNOSIS — C73 Malignant neoplasm of thyroid gland: Secondary | ICD-10-CM

## 2021-02-19 DIAGNOSIS — I129 Hypertensive chronic kidney disease with stage 1 through stage 4 chronic kidney disease, or unspecified chronic kidney disease: Secondary | ICD-10-CM | POA: Diagnosis not present

## 2021-02-19 DIAGNOSIS — E041 Nontoxic single thyroid nodule: Secondary | ICD-10-CM | POA: Diagnosis not present

## 2021-02-21 DIAGNOSIS — Z992 Dependence on renal dialysis: Secondary | ICD-10-CM | POA: Diagnosis not present

## 2021-02-21 DIAGNOSIS — N186 End stage renal disease: Secondary | ICD-10-CM | POA: Diagnosis not present

## 2021-02-21 DIAGNOSIS — N2581 Secondary hyperparathyroidism of renal origin: Secondary | ICD-10-CM | POA: Diagnosis not present

## 2021-02-24 DIAGNOSIS — Z992 Dependence on renal dialysis: Secondary | ICD-10-CM | POA: Diagnosis not present

## 2021-02-24 DIAGNOSIS — N186 End stage renal disease: Secondary | ICD-10-CM | POA: Diagnosis not present

## 2021-02-24 DIAGNOSIS — N2581 Secondary hyperparathyroidism of renal origin: Secondary | ICD-10-CM | POA: Diagnosis not present

## 2021-02-26 DIAGNOSIS — N2581 Secondary hyperparathyroidism of renal origin: Secondary | ICD-10-CM | POA: Diagnosis not present

## 2021-02-26 DIAGNOSIS — Z992 Dependence on renal dialysis: Secondary | ICD-10-CM | POA: Diagnosis not present

## 2021-02-26 DIAGNOSIS — N186 End stage renal disease: Secondary | ICD-10-CM | POA: Diagnosis not present

## 2021-02-28 DIAGNOSIS — N186 End stage renal disease: Secondary | ICD-10-CM | POA: Diagnosis not present

## 2021-02-28 DIAGNOSIS — N2581 Secondary hyperparathyroidism of renal origin: Secondary | ICD-10-CM | POA: Diagnosis not present

## 2021-02-28 DIAGNOSIS — Z992 Dependence on renal dialysis: Secondary | ICD-10-CM | POA: Diagnosis not present

## 2021-03-03 DIAGNOSIS — N2581 Secondary hyperparathyroidism of renal origin: Secondary | ICD-10-CM | POA: Diagnosis not present

## 2021-03-03 DIAGNOSIS — N186 End stage renal disease: Secondary | ICD-10-CM | POA: Diagnosis not present

## 2021-03-03 DIAGNOSIS — Z992 Dependence on renal dialysis: Secondary | ICD-10-CM | POA: Diagnosis not present

## 2021-03-05 DIAGNOSIS — N2581 Secondary hyperparathyroidism of renal origin: Secondary | ICD-10-CM | POA: Diagnosis not present

## 2021-03-05 DIAGNOSIS — Z992 Dependence on renal dialysis: Secondary | ICD-10-CM | POA: Diagnosis not present

## 2021-03-05 DIAGNOSIS — N186 End stage renal disease: Secondary | ICD-10-CM | POA: Diagnosis not present

## 2021-03-07 DIAGNOSIS — Z992 Dependence on renal dialysis: Secondary | ICD-10-CM | POA: Diagnosis not present

## 2021-03-07 DIAGNOSIS — N186 End stage renal disease: Secondary | ICD-10-CM | POA: Diagnosis not present

## 2021-03-07 DIAGNOSIS — N2581 Secondary hyperparathyroidism of renal origin: Secondary | ICD-10-CM | POA: Diagnosis not present

## 2021-03-10 DIAGNOSIS — N2581 Secondary hyperparathyroidism of renal origin: Secondary | ICD-10-CM | POA: Diagnosis not present

## 2021-03-10 DIAGNOSIS — N186 End stage renal disease: Secondary | ICD-10-CM | POA: Diagnosis not present

## 2021-03-10 DIAGNOSIS — Z992 Dependence on renal dialysis: Secondary | ICD-10-CM | POA: Diagnosis not present

## 2021-03-12 ENCOUNTER — Other Ambulatory Visit: Payer: Self-pay

## 2021-03-12 ENCOUNTER — Telehealth (INDEPENDENT_AMBULATORY_CARE_PROVIDER_SITE_OTHER): Payer: Medicare HMO | Admitting: Registered Nurse

## 2021-03-12 ENCOUNTER — Encounter: Payer: Self-pay | Admitting: Registered Nurse

## 2021-03-12 VITALS — BP 127/110

## 2021-03-12 DIAGNOSIS — Z992 Dependence on renal dialysis: Secondary | ICD-10-CM | POA: Diagnosis not present

## 2021-03-12 DIAGNOSIS — R519 Headache, unspecified: Secondary | ICD-10-CM

## 2021-03-12 DIAGNOSIS — N186 End stage renal disease: Secondary | ICD-10-CM | POA: Diagnosis not present

## 2021-03-12 DIAGNOSIS — N2581 Secondary hyperparathyroidism of renal origin: Secondary | ICD-10-CM | POA: Diagnosis not present

## 2021-03-12 MED ORDER — TIZANIDINE HCL 2 MG PO TABS: 2.0000 mg | ORAL_TABLET | Freq: Every day | ORAL | 0 refills | Status: DC

## 2021-03-12 NOTE — Patient Instructions (Signed)
° ° ° °  If you have lab work done today you will be contacted with your lab results within the next 2 weeks.  If you have not heard from us then please contact us. The fastest way to get your results is to register for My Chart. ° ° °IF you received an x-ray today, you will receive an invoice from [facility]. Please contact [facility] at [phone] with questions or concerns regarding your invoice.  ° °IF you received labwork today, you will receive an invoice from [facility]. Please contact [facility] at [phone] with questions or concerns regarding your invoice.  ° °Our billing staff will not be able to assist you with questions regarding bills from these companies. ° °You will be contacted with the lab results as soon as they are available. The fastest way to get your results is to activate your My Chart account. Instructions are located on the last page of this paperwork. If you have not heard from us regarding the results in 2 weeks, please contact this office. °  ° ° ° °

## 2021-03-12 NOTE — Progress Notes (Signed)
Telemedicine Encounter- SOAP NOTE Established Patient  This telephone encounter was conducted with the patient's (or proxy's) verbal consent via audio telecommunications: yes/no: Yes Patient was instructed to have this encounter in a suitably private space; and to only have persons present to whom they give permission to participate. In addition, patient identity was confirmed by use of name plus two identifiers (DOB and address).  I discussed the limitations, risks, security and privacy concerns of performing an evaluation and management service by telephone and the availability of in person appointments. I also discussed with the patient that there may be a patient responsible charge related to this service. The patient expressed understanding and agreed to proceed.  I spent a total of 14 minutes talking with the patient or their proxy.  Patient at home Provider in office  Participants: Kathrin Ruddy, NP and [name]  Chief Complaint  Patient presents with   Headache    Patient states he has been experiencing some headaches for the last 3-4  Pt has been taking some tylenol but not staying gone long    Subjective   Craig Flynn is a 60 y.o. established patient. Telephone visit today for headache  HPI Onset 2-3 Steady, not worsening.  Taking tylenol, limited relief   5-6/10 in intensity  Denies nvd, chest pain, visual changes, paresthesias.  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[place]) 08/28/2020   Acute pancreatitis 08/22/2020   B12 deficiency 06/08/2020    Gynecomastia, male 06/07/2020   Erectile dysfunction 06/07/2020   Hand cramp 06/07/2020   Aortic atherosclerosis ([place]) 06/07/2020   Anemia due to acquired thiamine deficiency 10/62/6948   Umbilical hernia without obstruction and without gangrene 03/18/2020   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   Deficiency anemia 03/18/2020   Hyperlipidemia LDL goal <130 03/18/2020   Upper back pain on left side 02/06/2020   History of TB (tuberculosis) 10/30/2019   Sinusitis 10/16/2019   Allergic rhinitis 10/16/2019   CAD (coronary artery disease) 09/07/2019   Nephrosclerosis 09/07/2019   Obesity (BMI 30-39.9) 09/07/2019   Secondary hyperparathyroidism ([place]) 09/07/2019   CKD (chronic kidney disease), stage V ([place]) 06/22/2019   Obesity, diabetes, and hypertension syndrome ([place]) 06/22/2019   Ulnar neuritis, right 04/25/2019   Muscle cramps 02/26/2019   Abnormal TSH 02/26/2019   Vitamin D deficiency 02/22/2019   Iron deficiency 02/22/2019   Chest pain 11/01/2018   Laryngopharyngeal reflux (LPR) 10/04/2018   Post-nasal drainage 10/04/2018   Whiplash injuries, initial encounter 09/21/2018   Degenerative arthritis of right knee 09/21/2018   Chronic diastolic HF (heart failure) ([place]) 07/04/2018   Intractable post-traumatic headache 06/16/2018   Low back pain 06/15/2018   Neck pain 54/62/7035   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   Thyroid nodule 01/22/2017   Ptosis 00/93/8182   Chronic systolic heart failure ([place]) 08/01/2015   GERD (gastroesophageal reflux disease) 07/29/2015   Osteoarthritis 05/03/2015   Hyperlipidemia 99/37/1696   Diastolic dysfunction 78/93/8101   Tobacco abuse 02/26/2015   OSA (obstructive sleep apnea) 12/27/2014   Encounter for well adult exam with abnormal findings 11/12/2014   Anemia in chronic kidney disease  11/12/2014   Left knee pain 09/22/2014   Paresthesia of left leg 07/37/1062   Acute systolic CHF (congestive heart failure) ([place]) 11/17/2013    Morbid obesity ([place]) 11/17/2013   Hyperglycemia 12/12/2011   Acute on chronic renal failure ([place]) 12/11/2011   HTN (hypertension) 12/11/2011   SOB (shortness of breath) 12/11/2011   Gout 12/31/2006   HERNIATED LUMBAR DISK WITH RADICULOPATHY 12/30/2006    Past Medical History:  Diagnosis Date   Anemia 06/2015   Arthritis    knee   Cancer ([place])    thyroid cancer   Cardiomyopathy ([place])    a. 10/2013: EF reduced to 35-40% b. 09/2014: EF improved to 55-60%, Grade 1 DD noted.   Chest pain 06/2015   CHF (congestive heart failure) (HCC)    CKD (chronic kidney disease), stage IV [facility])    sees  Dr Lorrene Reid   Dyspnea    with exerion    GERD (gastroesophageal reflux disease)    Gout    Hypertension    Obesity    Pancreatitis    Sleep apnea    not wearing c-pap now, needs new slep study per pt. (01/22/2017)   Tuberculosis 1981   while the pt was in the service.   Tubular adenoma of colon 10/2015    Current Outpatient Medications  Medication Sig Dispense Refill   tiZANidine (ZANAFLEX) 2 MG tablet Take 1 tablet (2 mg total) by mouth daily. 30 tablet 0   acetaminophen (TYLENOL) 500 MG tablet Take 1,000 mg by mouth every 6 (six) hours as needed for moderate pain.     albuterol (VENTOLIN HFA) 108 (90 Base) MCG/ACT inhaler INHALE 2 PUFFS INTO THE LUNGS EVERY 6 HOURS AS NEEDED FOR WHEEZING OR SHORTNESS OF BREATH (Patient taking differently: Inhale 2 puffs into the lungs every 6 (six) hours as needed for wheezing or shortness of breath.) 18 g 5   aspirin EC 81 MG tablet Take 81 mg by mouth daily.     carvedilol (COREG) 25 MG tablet TAKE 1 TABLET(25 MG) BY MOUTH TWICE DAILY 180 tablet 2   fenofibrate 54 MG tablet TAKE 1 TABLET BY MOUTH EVERY DAY 60 tablet 0   iron polysaccharides (NIFEREX) 150 MG capsule Take 1 capsule (150 mg total) by mouth daily. 30 capsule 0   iron sucrose in sodium chloride 0.9 % 100 mL Iron Sucrose (Venofer)     isosorbide mononitrate (IMDUR) 30 MG 24 hr tablet TAKE 2  TABLETS BY MOUTH DAILY. SCHEDULE OFFICE VISIT FOR FUTURE REFILLS 180 tablet 3   ketoconazole (NIZORAL) 2 % cream Apply 1 application topically daily. 60 g 2   lidocaine-prilocaine (EMLA) cream Apply 1 application topically 3 (three) times a week. MWF     nitroGLYCERIN (NITROSTAT) 0.4 MG SL tablet DISSOLVE 1 TABLET UNDER THE TONGUE EVERY 5 MINUTES AS NEEDED FOR CHEST PAIN (Patient taking differently: Place 0.4 mg under the tongue every 5 (five) minutes as needed for chest pain.) 25 tablet 3   pantoprazole (PROTONIX) 40 MG tablet Take 1 tablet (40 mg total) by mouth 2 (two) times daily before a meal. 180 tablet 3   Potassium Chloride ER 20 MEQ TBCR Take 1 tablet by mouth daily.     thiamine (VITAMIN B-1) 50 MG tablet Take 1 tablet (50 mg total) by mouth daily. 90 tablet 1   vitamin B-12 (CYANOCOBALAMIN) 1000 MCG tablet Take 1,000 mcg by mouth daily.     No current facility-administered medications for this visit.    No Known  Allergies  Social History   Socioeconomic History   Marital status: Divorced    Spouse name: Not on file   Number of children: 2   Years of education: 12   Highest education level: Not on file  Occupational History   Occupation: Disability    Comment: Knees   Tobacco Use   Smoking status: Every Day    Packs/day: 0.25    Years: 40.00    Pack years: 10.00    Types: Cigarettes   Smokeless tobacco: Never   Tobacco comments:    3 cigarettes per day  Vaping Use   Vaping Use: Never used  Substance and Sexual Activity   Alcohol use: Not Currently    Alcohol/week: 0.0 standard drinks   Drug use: No   Sexual activity: Yes  Other Topics Concern   Not on file  Social History Narrative   Fun: Golf, basketball    Denies religious beliefs effecting health care.    Social Determinants of Health   Financial Resource Strain: Not on file  Food Insecurity: No Food Insecurity   Worried About Charity fundraiser in the Last Year: Never true   Ran Out of Food in the  Last Year: Never true  Transportation Needs: No Transportation Needs   Lack of Transportation (Medical): No   Lack of Transportation (Non-Medical): No  Physical Activity: Not on file  Stress: Not on file  Social Connections: Not on file  Intimate Partner Violence: Not on file    ROS Per hpi   Objective   Vitals as reported by the patient: Today's Vitals   03/12/21 1215  BP: (!) 127/110    [place] was seen today for headache.  Diagnoses and all orders for this visit:  Daily headache -     tiZANidine (ZANAFLEX) 2 MG tablet; Take 1 tablet (2 mg total) by mouth daily.    PLAN BP  on the high side but he has regular monitoring with dialysis and cardiology. Continue current antihypertensive regimen. Follow up with PCP, cards as scheduled Will give low dose of tizanidine to take daily as needed with tylenol for headache. Discussed risks, benefits, AE, alternatives. Pt voices understanding. If persistent, may warrant head CT - would need in office visit with myself or PCP  Patient encouraged to call clinic with any questions, comments, or concerns.  I discussed the assessment and treatment plan with the patient. The patient was provided an opportunity to ask questions and all were answered. The patient agreed with the plan and demonstrated an understanding of the instructions.   The patient was advised to call back or seek an in-person evaluation if the symptoms worsen or if the condition fails to improve as anticipated.  I provided 14 minutes of non-face-to-face time during this encounter.  Maximiano Coss, NP

## 2021-03-13 ENCOUNTER — Ambulatory Visit: Payer: Medicare HMO

## 2021-03-14 DIAGNOSIS — N2581 Secondary hyperparathyroidism of renal origin: Secondary | ICD-10-CM | POA: Diagnosis not present

## 2021-03-14 DIAGNOSIS — N186 End stage renal disease: Secondary | ICD-10-CM | POA: Diagnosis not present

## 2021-03-14 DIAGNOSIS — Z992 Dependence on renal dialysis: Secondary | ICD-10-CM | POA: Diagnosis not present

## 2021-03-17 DIAGNOSIS — N186 End stage renal disease: Secondary | ICD-10-CM | POA: Diagnosis not present

## 2021-03-17 DIAGNOSIS — N2581 Secondary hyperparathyroidism of renal origin: Secondary | ICD-10-CM | POA: Diagnosis not present

## 2021-03-17 DIAGNOSIS — Z992 Dependence on renal dialysis: Secondary | ICD-10-CM | POA: Diagnosis not present

## 2021-03-19 DIAGNOSIS — Z992 Dependence on renal dialysis: Secondary | ICD-10-CM | POA: Diagnosis not present

## 2021-03-19 DIAGNOSIS — N186 End stage renal disease: Secondary | ICD-10-CM | POA: Diagnosis not present

## 2021-03-19 DIAGNOSIS — N2581 Secondary hyperparathyroidism of renal origin: Secondary | ICD-10-CM | POA: Diagnosis not present

## 2021-03-20 ENCOUNTER — Ambulatory Visit (INDEPENDENT_AMBULATORY_CARE_PROVIDER_SITE_OTHER): Payer: Medicare HMO | Admitting: *Deleted

## 2021-03-20 DIAGNOSIS — I5032 Chronic diastolic (congestive) heart failure: Secondary | ICD-10-CM

## 2021-03-20 DIAGNOSIS — N185 Chronic kidney disease, stage 5: Secondary | ICD-10-CM

## 2021-03-20 DIAGNOSIS — I5022 Chronic systolic (congestive) heart failure: Secondary | ICD-10-CM

## 2021-03-20 NOTE — Patient Instructions (Signed)
Visit Information  Griffin, Thank you for taking time to talk with me today. Please don't hesitate to contact me if I can be of assistance to you before our next scheduled telephone appointment.  Below are the goals we discussed today:  Patient Self-Care Activities: Patient Craig Flynn will: Take medications as prescribed   Attend all scheduled provider appointments  Call pharmacy for medication refills  Call provider office for new concerns or questions  Continue to follow heart healthy, low salt, renal diet Continue to attend hemodialysis sessions M-W-F as scheduled Continue to stay active and walk for exercise daily  Our next scheduled telephone follow up visit/ appointment with care management team member is scheduled on:  Tuesday, June 17, 2021 at 9:00 am  If you need to cancel or re-schedule our visit, please call [phone] and our care guide team will be happy to assist you.   I look forward to hearing about your progress.   Oneta Rack, RN, BSN, [place] [phone]: direct office  If you are experiencing a Mental Health or [place] or need someone to talk to, please  call the Suicide and Crisis Lifeline: 988 call the [place] National Suicide Prevention Lifeline: (867)282-3609 or TTY: (513)605-2985 TTY 989-766-8032) to talk to a trained counselor call 1-800-273-TALK (toll free, 24 hour hotline) go to [facility] [address], [place] [phone]) call 911   The patient verbalized understanding of instructions, educational materials, and care plan provided today and agreed to receive a mailed copy of patient instructions, educational materials, and care plan  End-Stage Kidney Disease End-stage kidney disease occurs when the kidneys are so damaged that they cannot function and cannot get better. This condition may also be referred to as end-stage renal  disease or ESRD. The kidneys are two organs that do many important jobs in the body, including: Removing waste and extra fluid from the blood to make urine. Making hormones that maintain the amount of fluid in tissues and blood vessels. Maintaining the right amount of fluids and chemicals in the body. Without functioning kidneys, toxins build up in the blood, and life-threatening complications can occur. What are the causes? This condition usually occurs when a long-term (chronic) kidney disease gets worse and results in permanent damage to the kidneys. It may also be caused by sudden damage to the kidneys (acute kidney injury). What increases the risk? The following factors may make you more likely to develop this condition: Having a family history of chronic kidney disease (CKD). Chronic medical conditions that affect the kidneys, such as: Cardiovascular disease, including high blood pressure. Diabetes. Certain diseases that affect the body's disease-fighting system (immunesystem). Smoking or a history of smoking. Overuse of over-the-counter pain medicines. Being around or being in contact with poisonous (toxic) substances. What are the signs or symptoms? Symptoms of this condition include: Swelling of the face, legs, ankles, or feet. Numbness, tingling, or loss of feeling in the hands or feet. Tiredness (lethargy). Nausea or vomiting. Confusion, trouble concentrating, or loss of consciousness. Chest pain. Shortness of breath. Passing little or no urine. Muscle twitches and cramps, especially in the legs. Skin changes, such as: Dry, itchy skin. Pale skin due to anemia, including the skin and tissue around the eye (conjunctiva). Abnormally dark or light skin. Loss of appetite. Headaches. Decrease in muscle size (muscle wasting). Easy bruising. Stopping of menstrual periods, in women. Jerky movements (seizures). How is this diagnosed? This  condition may be diagnosed based  on: A physical exam, including blood pressure measurements. Urine tests. Blood tests. Imaging tests. A test in which a sample of tissue is removed from the kidneys to be examined under a microscope (kidney biopsy). How is this treated? This condition may be treated with: Dialysis, which is a procedure that removes toxic wastes from the body. There are two types of dialysis: Dialysis that is done through your abdomen. This is called peritoneal dialysis and may be done several times a day. Dialysis that is done by a machine. This is called hemodialysis and may be done several times a week. Kidney transplant. This is surgery to receive a new kidney. In addition to having dialysis or a kidney transplant, you may need to take medicines: To control high blood pressure (hypertension). To control high cholesterol. To treat diabetes. To maintain healthy levels of minerals in the blood (electrolytes). You may also be given a specific meal plan to follow that includes requirements or limits for: Salt (sodium). Protein. Phosphorus. Potassium. Calcium. Follow these instructions at home: Medicines Take over-the-counter and prescription medicines only as told by your health care provider. Do not take any new medicines, vitamins, or mineral supplements unless approved by your health care provider. Many medicines and supplements can worsen kidney damage. Follow instructions from your health care provider about adjusting the doses of any medicines you take. Lifestyle Do not use any products that contain nicotine or tobacco, such as cigarettes, e-cigarettes, and chewing tobacco. If you need help quitting, ask your health care provider. Achieve and maintain a healthy weight. If you need help with this, ask your health care provider. Start or continue an exercise plan. Exercise at least 30 minutes a day, 5 days a week. Follow your prescribed meal plan. General instructions Stay current with your shots  (immunizations) as told by your health care provider. Keep track of your blood pressure. Report changes in your blood pressure as told by your health care provider. If you are being treated for diabetes, monitor and track your blood sugar (blood glucose) levels as told by your health care provider. Keep all follow-up visits. This is important. Where to find more information American Association of Kidney Patients: BombTimer.gl National Kidney Foundation: www.kidney.Jud: https://mathis.com/ [facility]: www.lifeoptions.org Kidney School: www.kidneyschool.org Contact a health care provider if: Your symptoms get worse. You develop new symptoms. Get help right away if: You have weakness in an arm or leg on one side of your body. You have difficulty speaking or you are slurring your speech. You have a sudden change in your vision. You have a sudden, severe headache. You have a sudden weight increase. You have difficulty breathing. Your symptoms suddenly get worse. You have chest pain. Summary End-stage kidney disease occurs when the kidneys are so damaged that they cannot function and cannot get better. Without functioning kidneys, toxins build up in the blood and life-threatening complications can occur. Treatment may include dialysis or a kidney transplant along with medicines and lifestyle changes. This information is not intended to replace advice given to you by your health care provider. Make sure you discuss any questions you have with your health care provider. Document Revised: 07/03/2019 Document Reviewed: 06/09/2019 Elsevier Patient Education  [place].

## 2021-03-20 NOTE — Chronic Care Management (AMB) (Signed)
Chronic Care Management   CCM RN Visit Note  03/20/2021 Name: Craig Flynn MRN: [id number] DOB: 1961-02-20  Subjective: Craig Flynn is a 60 y.o. year old male who is a primary care patient of Birdie, Beveridge, MD. The care management team was consulted for assistance with disease management and care coordination needs.    Engaged with patient by telephone for follow up visit in response to provider referral for case management and/or care coordination services.   Consent to Services:  The patient was given information about Chronic Care Management services, agreed to services, and gave verbal consent prior to initiation of services.  Please see initial visit note for detailed documentation.  Patient agreed to services and verbal consent obtained.   Assessment: Review of patient past medical history, allergies, medications, health status, including review of consultants reports, laboratory and other test data, was performed as part of comprehensive evaluation and provision of chronic care management services.   SDOH (Social Determinants of Health) assessments and interventions performed:  SDOH Interventions    Flowsheet Row Most Recent Value  SDOH Interventions   Food Insecurity Interventions Intervention Not Indicated  Transportation Interventions Intervention Not Indicated  [Continues to drive self]       CCM Care Plan No Known Allergies  Outpatient Encounter Medications as of 03/20/2021  Medication Sig Note   acetaminophen (TYLENOL) 500 MG tablet Take 1,000 mg by mouth every 6 (six) hours as needed for moderate pain.    albuterol (VENTOLIN HFA) 108 (90 Base) MCG/ACT inhaler INHALE 2 PUFFS INTO THE LUNGS EVERY 6 HOURS AS NEEDED FOR WHEEZING OR SHORTNESS OF BREATH (Patient taking differently: Inhale 2 puffs into the lungs every 6 (six) hours as needed for wheezing or shortness of breath.) 12/19/2020: 12/19/20- reports has not needed recently   aspirin EC 81 MG tablet Take 81 mg by  mouth daily.    carvedilol (COREG) 25 MG tablet TAKE 1 TABLET(25 MG) BY MOUTH TWICE DAILY 12/19/2020: 12/19/20-- reports taking around Hemodialysis: does not take in am on mornings he gets HD (M-W-F)   fenofibrate 54 MG tablet TAKE 1 TABLET BY MOUTH EVERY DAY    iron polysaccharides (NIFEREX) 150 MG capsule Take 1 capsule (150 mg total) by mouth daily.    iron sucrose in sodium chloride 0.9 % 100 mL Iron Sucrose (Venofer)    isosorbide mononitrate (IMDUR) 30 MG 24 hr tablet TAKE 2 TABLETS BY MOUTH DAILY. SCHEDULE OFFICE VISIT FOR FUTURE REFILLS    ketoconazole (NIZORAL) 2 % cream Apply 1 application topically daily.    lidocaine-prilocaine (EMLA) cream Apply 1 application topically 3 (three) times a week. MWF    nitroGLYCERIN (NITROSTAT) 0.4 MG SL tablet DISSOLVE 1 TABLET UNDER THE TONGUE EVERY 5 MINUTES AS NEEDED FOR CHEST PAIN (Patient taking differently: Place 0.4 mg under the tongue every 5 (five) minutes as needed for chest pain.) 12/19/2020: 12/19/20: Reports has never needed to use   pantoprazole (PROTONIX) 40 MG tablet Take 1 tablet (40 mg total) by mouth 2 (two) times daily before a meal.    Potassium Chloride ER 20 MEQ TBCR Take 1 tablet by mouth daily.    thiamine (VITAMIN B-1) 50 MG tablet Take 1 tablet (50 mg total) by mouth daily.    tiZANidine (ZANAFLEX) 2 MG tablet Take 1 tablet (2 mg total) by mouth daily.    vitamin B-12 (CYANOCOBALAMIN) 1000 MCG tablet Take 1,000 mcg by mouth daily.    No facility-administered encounter medications on file as of  03/20/2021.   Patient Active Problem List   Diagnosis Date Noted   Mild protein-calorie malnutrition ([place]) 10/07/2020   Allergy, unspecified, initial encounter 08/28/2020   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/28/2020   Cardiomyopathy, unspecified ([place]) 26/20/3559   Complication of vascular dialysis catheter 08/28/2020   Other specified coagulation defects ([place]) 08/28/2020   End stage renal disease ([place]) 08/28/2020   Acute pancreatitis  08/22/2020   B12 deficiency 06/08/2020   Gynecomastia, male 06/07/2020   Erectile dysfunction 06/07/2020   Hand cramp 06/07/2020   Aortic atherosclerosis ([place]) 06/07/2020   Anemia due to acquired thiamine deficiency 74/16/3845   Umbilical hernia without obstruction and without gangrene 03/18/2020   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   Deficiency anemia 03/18/2020   Hyperlipidemia LDL goal <130 03/18/2020   Upper back pain on left side 02/06/2020   History of TB (tuberculosis) 10/30/2019   Sinusitis 10/16/2019   Allergic rhinitis 10/16/2019   CAD (coronary artery disease) 09/07/2019   Nephrosclerosis 09/07/2019   Obesity (BMI 30-39.9) 09/07/2019   Secondary hyperparathyroidism ([place]) 09/07/2019   CKD (chronic kidney disease), stage V ([place]) 06/22/2019   Obesity, diabetes, 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   Ulnar neuritis, right 04/25/2019   Muscle cramps 02/26/2019   Abnormal TSH 02/26/2019   Vitamin D deficiency 02/22/2019   Iron deficiency 02/22/2019   Chest pain 11/01/2018   Laryngopharyngeal reflux (LPR) 10/04/2018   Post-nasal drainage 10/04/2018   Whiplash injuries, initial encounter 09/21/2018   Degenerative arthritis of right knee 09/21/2018   Chronic diastolic HF (heart failure) ([place]) 07/04/2018   Intractable post-traumatic headache 06/16/2018   Low back pain 06/15/2018   Neck pain 36/46/8032   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   Thyroid nodule 01/22/2017   Ptosis 03/15/8249   Chronic systolic heart failure ([place]) 08/01/2015   GERD (gastroesophageal reflux disease) 07/29/2015   Osteoarthritis 05/03/2015   Hyperlipidemia 03/70/4888   Diastolic dysfunction 91/69/4503   Tobacco abuse 02/26/2015   OSA (obstructive sleep apnea) 12/27/2014   Encounter for well adult exam with abnormal findings 11/12/2014   Anemia in chronic kidney disease 11/12/2014   Left knee pain 09/22/2014   Paresthesia of left leg 88/82/8003   Acute systolic CHF  (congestive heart failure) ([place]) 11/17/2013   Morbid obesity ([place]) 11/17/2013   Hyperglycemia 12/12/2011   Acute on chronic renal failure ([place]) 12/11/2011   HTN (hypertension) 12/11/2011   SOB (shortness of breath) 12/11/2011   Gout 12/31/2006   HERNIATED LUMBAR DISK WITH RADICULOPATHY 12/30/2006   Conditions to be addressed/monitored:  CHF and ESRD  Care Plan : RN Care Manager Plan of Care  Updates made by Knox Royalty, RN since 03/20/2021 12:00 AM     Problem: Chronic Disease Management Needs   Priority: Medium     Long-Range Goal: Development of plan of care for long term chronic disease management   Start Date: 12/19/2020  Expected End Date: 12/19/2021  Priority: Medium  Note:   Current Barriers:  Chronic Disease Management support and education needs related to CHF and ESRD New to hemodialysis; actively pursuing kidney transplant through [facility]):  Patient will demonstrate ongoing health management independence ESRD- on hemodialysis/ CHF  through collaboration with RN Care manager, provider, and care team.   Interventions: 1:1 collaboration with primary care provider regarding development and update of comprehensive plan of care as evidenced by provider attestation and co-signature Inter-disciplinary care team collaboration (see longitudinal plan of care) Evaluation of current treatment plan related to  self management and patient's adherence to plan as established by provider Confirmed no new/ recent falls since last outreach 12/19/20; does not use assistive devices; positive reinforcement provided with encouragement to continue efforts  Heart Failure Interventions:  (Status: Goal on Track (progressing): YES.) Basic overview and discussion of pathophysiology of Heart Failure reviewed Provided education on low sodium diet Discussed the importance of keeping all appointments with provider Provided patient with education about the  role of exercise in the management of heart failure Screening for signs and symptoms of depression related to chronic disease state  Confirmed no medication concerns- continues to independently self- manage Confirmed patient follows heart healthy, low salt, renal diet Confirmed patient exercises/ is active- reports walks daily; has been walking inside at mall during periods of cold weather: positive reinforcement provided Discussed daily weight management at home: patient weighs self regularly at home, and also weighs weekly tid at hemodialysis sessions; reports general weight ranges at home currently between 250-255 lbs Confirmed patient has good baseline understanding of signs/ symptoms yellow CHF zone, along with corresponding action plan Reviewed upcoming scheduled provider appointments and confirmed patient has plans to attend as scheduled: 03/25/21- AWE/ PCP office; 06/12/21- PCP office visit Reviewed recent 01/24/21-- cardiology provider ([place]) office visit: confirms no changes to medications, overall plan of care Reviewed signs/ symptoms along with corresponding action plan for MI/ CVA  Chronic Kidney Disease (Status: Goal on Track (progressing): YES.)  Last practice recorded BP readings:  BP Readings from Last 3 Encounters:  03/12/21 (!) 127/110  01/28/21 122/76  01/24/21 140/70  Most recent eGFR/CrCl: No results found for: EGFR  No components found for: CRCL  Evaluation of current treatment plan related to chronic kidney disease self management and patient's adherence to plan as established by provider      Reviewed prescribed diet renal/ heart healthy, low salt: confirmed he has has formal diet/ nutrition education for renal diet provided at hemodialysis center Counseled on the importance of exercise goals with target of 150 minutes per week     Discussed plans with patient for ongoing care management follow up and provided patient with direct contact information for care  management team    Discussed the impact of chronic kidney disease on daily life and mental health and acknowledged and normalized feelings of disempowerment, fear, and frustration    Provided education on kidney disease progression    Engage patient in early, proactive and ongoing discussion about goals of care and what matters most to them    Discussed general plan for upcoming renal/ kidney transplant: reports "still waiting" to hear from [facility] transplant team; communicates with team "regularly" and hopes to be placed on transplant list "soon" Discussed patient's ongoing toleration of recently started hemodialysis: reports has tolerated well thus far, denies problems, concerns: he tells of me of one session where too much fluid was pulled off-- this was promptly resolved and has not occurred since; denies concerns around hemodialysis Discussed patient's daily fluid restriction guidelines: 3200 cc/d per patient report- reports he is following fluid restrictions according to instructions Confirmed patient has been adherent to hemodialysis schedule: has not missed any HD sessions, attending M-W-F in "mid-afternoon" hours  Patient Goals/Self-Care Activities: As evidenced by review of EHR, collaboration with care team, and patient reporting during CCM RN CM outreach, Patient  Zekiel will: Take medications as prescribed   Attend all scheduled provider appointments  Call pharmacy for medication refills  Call provider office for new concerns or questions  Continue to follow heart healthy, low salt, renal diet Continue to attend hemodialysis sessions M-W-F as scheduled Continue to stay active and walk for exercise daily    Plan: Telephone follow up appointment with care management team member scheduled for:  Tuesday, June 17, 2021 at 9:00 am The patient has been provided with contact information for the care management team and has been advised to call with any health related  questions or concerns   Oneta Rack, RN, BSN, [place] [phone]: direct office

## 2021-03-21 DIAGNOSIS — N2581 Secondary hyperparathyroidism of renal origin: Secondary | ICD-10-CM | POA: Diagnosis not present

## 2021-03-21 DIAGNOSIS — Z992 Dependence on renal dialysis: Secondary | ICD-10-CM | POA: Diagnosis not present

## 2021-03-21 DIAGNOSIS — N186 End stage renal disease: Secondary | ICD-10-CM | POA: Diagnosis not present

## 2021-03-22 DIAGNOSIS — N185 Chronic kidney disease, stage 5: Secondary | ICD-10-CM

## 2021-03-22 DIAGNOSIS — I5032 Chronic diastolic (congestive) heart failure: Secondary | ICD-10-CM | POA: Diagnosis not present

## 2021-03-22 DIAGNOSIS — I5022 Chronic systolic (congestive) heart failure: Secondary | ICD-10-CM

## 2021-03-22 DIAGNOSIS — I129 Hypertensive chronic kidney disease with stage 1 through stage 4 chronic kidney disease, or unspecified chronic kidney disease: Secondary | ICD-10-CM | POA: Diagnosis not present

## 2021-03-22 DIAGNOSIS — N186 End stage renal disease: Secondary | ICD-10-CM | POA: Diagnosis not present

## 2021-03-22 DIAGNOSIS — Z992 Dependence on renal dialysis: Secondary | ICD-10-CM | POA: Diagnosis not present

## 2021-03-24 DIAGNOSIS — Z992 Dependence on renal dialysis: Secondary | ICD-10-CM | POA: Diagnosis not present

## 2021-03-24 DIAGNOSIS — N2581 Secondary hyperparathyroidism of renal origin: Secondary | ICD-10-CM | POA: Diagnosis not present

## 2021-03-24 DIAGNOSIS — N186 End stage renal disease: Secondary | ICD-10-CM | POA: Diagnosis not present

## 2021-03-25 ENCOUNTER — Other Ambulatory Visit: Payer: Self-pay

## 2021-03-25 ENCOUNTER — Ambulatory Visit (INDEPENDENT_AMBULATORY_CARE_PROVIDER_SITE_OTHER): Payer: Medicare HMO

## 2021-03-25 DIAGNOSIS — Z0000 Encounter for general adult medical examination without abnormal findings: Secondary | ICD-10-CM | POA: Diagnosis not present

## 2021-03-25 NOTE — Progress Notes (Signed)
I connected with Craig Flynn today by telephone and verified that I am speaking with the correct person using two identifiers. Location patient: home Location provider: work Persons participating in the virtual visit: patient, provider.   I discussed the limitations, risks, security and privacy concerns of performing an evaluation and management service by telephone and the availability of in person appointments. I also discussed with the patient that there may be a patient responsible charge related to this service. The patient expressed understanding and verbally consented to this telephonic visit.    Interactive audio and video telecommunications were attempted between this provider and patient, however failed, due to patient having technical difficulties OR patient did not have access to video capability.  We continued and completed visit with audio only.  Some vital signs may be absent or patient reported.   Time Spent with patient on telephone encounter: 40 minutes  Subjective:   Craig Flynn is a 61 y.o. male who presents for Medicare Annual/Subsequent preventive examination.  Review of Systems     Cardiac Risk Factors include: advanced age (>25men, >20 women);diabetes mellitus;dyslipidemia;family history of premature cardiovascular disease;hypertension;male gender;smoking/ tobacco exposure     Objective:    Today's Vitals   03/25/21 0922  PainSc: 0-No pain   There is no height or weight on file to calculate BMI.  Advanced Directives 03/25/2021 08/27/2020 08/22/2020 08/22/2020 07/31/2020 03/26/2020 02/01/2020  Does Patient Have a Medical Advance Directive? Yes - - No No No Yes  Type of Advance Directive - - - - - - -  Does patient want to make changes to medical advance directive? No - Patient declined - - - - - No - Patient declined  Would patient like information on creating a medical advance directive? - No - Patient declined No - Patient declined - No - Patient declined No -  Patient declined -    Current Medications (verified) Outpatient Encounter Medications as of 03/25/2021  Medication Sig   acetaminophen (TYLENOL) 500 MG tablet Take 1,000 mg by mouth every 6 (six) hours as needed for moderate pain.   albuterol (VENTOLIN HFA) 108 (90 Base) MCG/ACT inhaler INHALE 2 PUFFS INTO THE LUNGS EVERY 6 HOURS AS NEEDED FOR WHEEZING OR SHORTNESS OF BREATH (Patient taking differently: Inhale 2 puffs into the lungs every 6 (six) hours as needed for wheezing or shortness of breath.)   aspirin EC 81 MG tablet Take 81 mg by mouth daily.   carvedilol (COREG) 25 MG tablet TAKE 1 TABLET(25 MG) BY MOUTH TWICE DAILY   fenofibrate 54 MG tablet TAKE 1 TABLET BY MOUTH EVERY DAY   iron polysaccharides (NIFEREX) 150 MG capsule Take 1 capsule (150 mg total) by mouth daily.   iron sucrose in sodium chloride 0.9 % 100 mL Iron Sucrose (Venofer)   isosorbide mononitrate (IMDUR) 30 MG 24 hr tablet TAKE 2 TABLETS BY MOUTH DAILY. SCHEDULE OFFICE VISIT FOR FUTURE REFILLS   ketoconazole (NIZORAL) 2 % cream Apply 1 application topically daily.   lidocaine-prilocaine (EMLA) cream Apply 1 application topically 3 (three) times a week. MWF   nitroGLYCERIN (NITROSTAT) 0.4 MG SL tablet DISSOLVE 1 TABLET UNDER THE TONGUE EVERY 5 MINUTES AS NEEDED FOR CHEST PAIN (Patient taking differently: Place 0.4 mg under the tongue every 5 (five) minutes as needed for chest pain.)   pantoprazole (PROTONIX) 40 MG tablet Take 1 tablet (40 mg total) by mouth 2 (two) times daily before a meal.   Potassium Chloride ER 20 MEQ TBCR Take 1 tablet by  mouth daily.   thiamine (VITAMIN B-1) 50 MG tablet Take 1 tablet (50 mg total) by mouth daily.   tiZANidine (ZANAFLEX) 2 MG tablet Take 1 tablet (2 mg total) by mouth daily.   vitamin B-12 (CYANOCOBALAMIN) 1000 MCG tablet Take 1,000 mcg by mouth daily.   No facility-administered encounter medications on file as of 03/25/2021.    Allergies (verified) Patient has no known allergies.    History: Past Medical History:  Diagnosis Date   Anemia 06/2015   Arthritis    knee   Cancer ([place])    thyroid cancer   Cardiomyopathy ([place])    a. 10/2013: EF reduced to 35-40% b. 09/2014: EF improved to 55-60%, Grade 1 DD noted.   Chest pain 06/2015   CHF (congestive heart failure) (HCC)    CKD (chronic kidney disease), stage IV [facility])    sees  Dr Lorrene Reid   Dyspnea    with exerion    GERD (gastroesophageal reflux disease)    Gout    Hypertension    Obesity    Pancreatitis    Sleep apnea    not wearing c-pap now, needs new slep study per pt. (01/22/2017)   Tuberculosis 1981   while the pt was in the service.   Tubular adenoma of colon 10/2015   Past Surgical History:  Procedure Laterality Date   AV FISTULA PLACEMENT Left 03/25/2016   Procedure: Creation of Left Arm ARTERIOVENOUS (AV) FISTULA;  Surgeon: Elam Dutch, MD;  Location[facility];  Service: Vascular;  Laterality: Left;   AV FISTULA PLACEMENT Left 08/27/2020   Procedure: LEFT BASILIC VEIN ARTERIOVENOUS (AV) FISTULA CREATION;  Surgeon: Rosetta Posner, MD;  Location: [place];  Service: Vascular;  Laterality: Left;   [place] Left 10/22/2020   Procedure: LEFT SECOND STAGE BASILIC VEIN TRANSPOSITION;  Surgeon: Elam Dutch, MD;  Location: [place];  Service: Vascular;  Laterality: Left;   COLONOSCOPY W/ BIOPSIES AND POLYPECTOMY     "no problem"   HERNIA REPAIR     IR FLUORO GUIDE CV LINE RIGHT  08/22/2020   IR US GUIDE VASC ACCESS RIGHT  08/22/2020   LAPAROSCOPIC CHOLECYSTECTOMY     LIGATION OF COMPETING BRANCHES OF ARTERIOVENOUS FISTULA Left 01/25/2018   Procedure: LIGATION OF COMPETING BRANCHES And Revision of ARTERIOVENOUS FISTULA LEFT ARM.;  Surgeon: Elam Dutch, MD;  Location: [place];  Service: Vascular;  Laterality: Left;   THYROIDECTOMY  01/22/2017   THYROIDECTOMY Left 01/22/2017   Procedure: THYROIDECTOMY;  Surgeon: Melida Quitter, MD;  Location: [place];  Service: ENT;  Laterality: Left;    THYROIDECTOMY Right 03/26/2017   Procedure: COMPLETION OF THYROIDECTOMY;  Surgeon: Melida Quitter, MD;  Location: [facility] OR;  Service: ENT;  Laterality: Right;   UMBILICAL HERNIA REPAIR     WISDOM TOOTH EXTRACTION     Family History  Problem Relation Age of Onset   Stroke Mother    Pneumonia Father        died of Pneumonia x3   Kidney disease Maternal Grandmother    Hypertension Maternal Grandmother    Healthy Maternal Grandfather    Healthy Paternal Grandmother    Healthy Paternal Grandfather    CAD Neg Hx    Colon cancer Neg Hx    Esophageal cancer Neg Hx    Pancreatic cancer Neg Hx    Prostate cancer Neg Hx    Stomach cancer Neg Hx    Rectal cancer Neg Hx    Social History  Socioeconomic History   Marital status: Divorced    Spouse name: Not on file   Number of children: 2   Years of education: 40   Highest education level: Not on file  Occupational History   Occupation: Disability    Comment: Knees   Tobacco Use   Smoking status: Every Day    Packs/day: 0.25    Years: 40.00    Pack years: 10.00    Types: Cigarettes   Smokeless tobacco: Never   Tobacco comments:    3 cigarettes per day  Vaping Use   Vaping Use: Never used  Substance and Sexual Activity   Alcohol use: Not Currently    Alcohol/week: 0.0 standard drinks   Drug use: No   Sexual activity: Yes  Other Topics Concern   Not on file  Social History Narrative   Fun: Golf, basketball    Denies religious beliefs effecting health care.    Social Determinants of Health   Financial Resource Strain: Low Risk    Difficulty of Paying Living Expenses: Not hard at all  Food Insecurity: No Food Insecurity   Worried About Charity fundraiser in the Last Year: Never true   [place] in the Last Year: Never true  Transportation Needs: No Transportation Needs   Lack of Transportation (Medical): No   Lack of Transportation (Non-Medical): No  Physical Activity: Sufficiently Active   Days of Exercise per  Week: 7 days   Minutes of Exercise per Session: 30 min  Stress: No Stress Concern Present   Feeling of Stress : Not at all  Social Connections: Moderately Integrated   Frequency of Communication with Friends and Family: More than three times a week   Frequency of Social Gatherings with Friends and Family: More than three times a week   Attends Religious Services: More than 4 times per year   Active Member of [employer] or Organizations: Yes   Attends Music therapist: More than 4 times per year   Marital Status: Never married    Tobacco Counseling Ready to quit: Not Answered Counseling given: Not Answered Tobacco comments: 3 cigarettes per day   Clinical Intake:  Pre-visit preparation completed: Yes  Pain : No/denies pain Pain Score: 0-No pain     Nutritional Risks: Other (Comment) Diabetes: Yes CBG done?: No Did pt. bring in CBG monitor from home?: No  How often do you need to have someone help you when you read instructions, pamphlets, or other written materials from your doctor or pharmacy?: 1 - Never What is the last grade level you completed in school?: High School Graduate  Diabetic? yes  Interpreter Needed?: No  Information entered by :: Lisette Abu, LPN   Activities of Daily Living In your present state of health, do you have any difficulty performing the following activities: 03/25/2021 10/22/2020  Hearing? N -  Vision? N -  Difficulty concentrating or making decisions? N -  Walking or climbing stairs? N -  Dressing or bathing? N -  Doing errands, shopping? N N  Preparing Food and eating ? N -  Using the Toilet? N -  In the past six months, have you accidently leaked urine? N -  Do you have problems with loss of bowel control? N -  Managing your Medications? N -  Managing your Finances? N -  Housekeeping or managing your Housekeeping? N -  Some recent data might be hidden    Patient Care Team: Biagio Borg, MD  as PCP - General (Internal  Medicine) Minus Breeding, MD as PCP - Cardiology (Cardiology) Jamal Maes, MD as Consulting Physician (Nephrology) [place], [place], [place] as Consulting Physician [facility], [facility] [place] Royalty, RN as Case Manager  Indicate any recent Medical Services you may have received from other than [facility] providers in the past year (date may be approximate).     Assessment:   This is a routine wellness examination for Craig Flynn.  Hearing/Vision screen Hearing Screening - Comments:: Patient denied any hearing difficulty.   No hearing aids.  Vision Screening - Comments:: Patient wears corrective glasses/contacts.  Eye exam done annually by: [facility]  Dietary issues and exercise activities discussed: Current Exercise Habits: Home exercise routine, Type of exercise: walking, Time (Minutes): 30, Frequency (Times/Week): 7, Weekly Exercise (Minutes/Week): 210, Intensity: Moderate, Exercise limited by: cardiac condition(s);orthopedic condition(s)   Goals Addressed               This Visit's Progress     Patient Stated (pt-stated)        My goal for this year is to get a kidney transplant.      Depression Screen PHQ 2/9 Scores 03/25/2021 03/12/2021 12/19/2020 12/12/2020 06/07/2020 02/01/2020 09/07/2019  PHQ - 2 Score 0 0 0 0 0 0 0  PHQ- 9 Score - 0 - - - - -    Fall Risk Fall Risk  03/25/2021 03/20/2021 03/12/2021 12/19/2020 12/12/2020  Falls in the past year? 0 0 0 0 0  Comment - denies new/ recent falls since last outreach 12/19/20 - - -  Number falls in past yr: 0 0 0 0 0  Injury with Fall? 0 0 0 0 0  Comment - N/A- no falls reported - N/A- no falls reported -  Risk for fall due to : No Fall Risks No Fall Risks No Fall Risks No Fall Risks -  Follow up Falls prevention discussed Falls prevention discussed Falls evaluation completed Falls prevention discussed -  Comment - does not use assistive devices- steady on feet with ambulation - - -    FALL RISK PREVENTION  PERTAINING TO THE HOME:  Any stairs in or around the home? No  If so, are there any without handrails? No  Home free of loose throw rugs in walkways, pet beds, electrical cords, etc? Yes  Adequate lighting in your home to reduce risk of falls? Yes   ASSISTIVE DEVICES UTILIZED TO PREVENT FALLS:  Life alert? No  Use of a cane, walker or w/c? No  Grab bars in the bathroom? No  Shower chair or bench in shower? No  Elevated toilet seat or a handicapped toilet? No   TIMED UP AND GO:  Was the test performed? No .  Length of time to ambulate 10 feet: n/a sec.   Gait steady and fast without use of assistive device  Cognitive Function: Normal cognitive status assessed by direct observation by this Nurse Health Advisor. No abnormalities found.          Immunizations Immunization History  Administered Date(s) Administered   Influenza Whole 01/22/2008   PFIZER Comirnaty(Gray Top)Covid-19 Tri-Sucrose Vaccine 05/02/2020   PFIZER(Purple Top)SARS-COV-2 Vaccination 06/24/2019, 07/18/2019, 05/02/2020   Pneumococcal Polysaccharide-23 01/22/2008    TDAP status: Up to date  Flu Vaccine status: Declined, Education has been provided regarding the importance of this vaccine but patient still declined. Advised may receive this vaccine at local pharmacy or Health Dept. Aware to provide a copy of the vaccination record if obtained  from local pharmacy or Health Dept. Verbalized acceptance and understanding.  Pneumococcal vaccine status: Declined,  Education has been provided regarding the importance of this vaccine but patient still declined. Advised may receive this vaccine at local pharmacy or Health Dept. Aware to provide a copy of the vaccination record if obtained from local pharmacy or Health Dept. Verbalized acceptance and understanding.   Covid-19 vaccine status: Completed vaccines  Qualifies for Shingles Vaccine? Yes   Zostavax completed No   Shingrix Completed?: No.    Education has been  provided regarding the importance of this vaccine. Patient has been advised to call insurance company to determine out of pocket expense if they have not yet received this vaccine. Advised may also receive vaccine at local pharmacy or Health Dept. Verbalized acceptance and understanding.  Screening Tests Health Maintenance  Topic Date Due   URINE MICROALBUMIN  Never done   COVID-19 Vaccine (5 - Booster for Pfizer series) 06/27/2020   HEMOGLOBIN A1C  12/08/2020   OPHTHALMOLOGY EXAM  04/30/2021 (Originally 11/26/2020)   TETANUS/TDAP  12/12/2021 (Originally 09/21/2019)   FOOT EXAM  02/11/2022   COLONOSCOPY (Pts 45-35yrs Insurance coverage will need to be confirmed)  12/20/2025   Hepatitis C Screening  Completed   HIV Screening  Completed   HPV VACCINES  Aged Out   Pneumococcal Vaccine 68-86 Years old  Discontinued   INFLUENZA VACCINE  Discontinued   Zoster Vaccines- [place] Maintenance Due  Topic Date Due   URINE MICROALBUMIN  Never done   COVID-19 Vaccine (5 - Booster for [place] series) 06/27/2020   HEMOGLOBIN A1C  12/08/2020    Colorectal cancer screening: Type of screening: Colonoscopy. Completed 12/21/2018. Repeat every 7 years  Lung Cancer Screening: (Low Dose CT Chest recommended if Age 33-80 years, 30 pack-year currently smoking OR have quit w/in 15years.) does qualify.   Lung Cancer Screening Referral: no  Additional Screening:  Hepatitis C Screening: does qualify; Completed yes  Vision Screening: Recommended annual ophthalmology exams for early detection of glaucoma and other disorders of the eye. Is the patient up to date with their annual eye exam?  Yes  Who is the provider or what is the name of the office in which the patient attends annual eye exams? [facility] If pt is not established with a provider, would they like to be referred to a provider to establish care? No .   Dental Screening: Recommended annual dental  exams for proper oral hygiene  Community Resource Referral / Chronic Care Management: CRR required this visit?  No   CCM required this visit?  No      Plan:     I have personally reviewed and noted the following in the patients chart:   Medical and social history Use of alcohol, tobacco or illicit drugs  Current medications and supplements including opioid prescriptions. Patient is not currently taking opioid prescriptions. Functional ability and status Nutritional status Physical activity Advanced directives List of other physicians Hospitalizations, surgeries, and ER visits in previous 12 months Vitals Screenings to include cognitive, depression, and falls Referrals and appointments  In addition, I have reviewed and discussed with patient certain preventive protocols, quality metrics, and best practice recommendations. A written personalized care plan for preventive services as well as general preventive health recommendations were provided to patient.     Sheral Flow, LPN   06/27/8293   Nurse Notes:  Patient is cogitatively intact. There were no vitals filed  for this visit. There is no height or weight on file to calculate BMI.

## 2021-03-26 DIAGNOSIS — Z992 Dependence on renal dialysis: Secondary | ICD-10-CM | POA: Diagnosis not present

## 2021-03-26 DIAGNOSIS — N186 End stage renal disease: Secondary | ICD-10-CM | POA: Diagnosis not present

## 2021-03-26 DIAGNOSIS — N2581 Secondary hyperparathyroidism of renal origin: Secondary | ICD-10-CM | POA: Diagnosis not present

## 2021-03-28 DIAGNOSIS — Z992 Dependence on renal dialysis: Secondary | ICD-10-CM | POA: Diagnosis not present

## 2021-03-28 DIAGNOSIS — N186 End stage renal disease: Secondary | ICD-10-CM | POA: Diagnosis not present

## 2021-03-28 DIAGNOSIS — N2581 Secondary hyperparathyroidism of renal origin: Secondary | ICD-10-CM | POA: Diagnosis not present

## 2021-03-31 DIAGNOSIS — N2581 Secondary hyperparathyroidism of renal origin: Secondary | ICD-10-CM | POA: Diagnosis not present

## 2021-03-31 DIAGNOSIS — N186 End stage renal disease: Secondary | ICD-10-CM | POA: Diagnosis not present

## 2021-03-31 DIAGNOSIS — Z992 Dependence on renal dialysis: Secondary | ICD-10-CM | POA: Diagnosis not present

## 2021-04-01 ENCOUNTER — Encounter: Payer: Self-pay | Admitting: Internal Medicine

## 2021-04-01 NOTE — Progress Notes (Deleted)
Subjective:    Patient ID: Craig Flynn, male    DOB: 19-Aug-1960, 61 y.o.   MRN: [id number]  This visit occurred during the SARS-CoV-2 public health emergency.  Safety protocols were in place, including screening questions prior to the visit, additional usage of staff PPE, and extensive cleaning of exam room while observing appropriate contact time as indicated for disinfecting solutions.    HPI He is here for an acute visit for cold symptoms.  His symptoms started  He is experiencing   He has tried taking      Medications and allergies reviewed with patient and updated if appropriate.  Patient Active Problem List   Diagnosis Date Noted   Mild protein-calorie malnutrition ([place]) 10/07/2020   Allergy, unspecified, initial encounter 08/28/2020   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/28/2020   Cardiomyopathy, unspecified ([place]) 89/37/3428   Complication of vascular dialysis catheter 08/28/2020   Other specified coagulation defects ([place]) 08/28/2020   End stage renal disease ([place]) 08/28/2020   Acute pancreatitis 08/22/2020   B12 deficiency 06/08/2020   Gynecomastia, male 06/07/2020   Erectile dysfunction 06/07/2020   Hand cramp 06/07/2020   Aortic atherosclerosis ([place]) 06/07/2020   Anemia due to acquired thiamine deficiency 76/81/1572   Umbilical hernia without obstruction and without gangrene 03/18/2020   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   Deficiency anemia 03/18/2020   Hyperlipidemia LDL goal <130 03/18/2020   Upper back pain on left side 02/06/2020   History of TB (tuberculosis) 10/30/2019   Sinusitis 10/16/2019   Allergic rhinitis 10/16/2019   CAD (coronary artery disease) 09/07/2019   Nephrosclerosis 09/07/2019   Obesity (BMI 30-39.9) 09/07/2019   Secondary hyperparathyroidism ([place]) 09/07/2019   CKD (chronic kidney disease), stage V ([place]) 06/22/2019   Obesity, diabetes, and hypertension syndrome ([place]) 06/22/2019   Ulnar neuritis, right 04/25/2019   Muscle  cramps 02/26/2019   Abnormal TSH 02/26/2019   Vitamin D deficiency 02/22/2019   Iron deficiency 02/22/2019   Chest pain 11/01/2018   Laryngopharyngeal reflux (LPR) 10/04/2018   Post-nasal drainage 10/04/2018   Whiplash injuries, initial encounter 09/21/2018   Degenerative arthritis of right knee 09/21/2018   Chronic diastolic HF (heart failure) ([place]) 07/04/2018   Intractable post-traumatic headache 06/16/2018   Low back pain 06/15/2018   Neck pain 62/05/5595   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   Thyroid nodule 01/22/2017   Ptosis 41/63/8453   Chronic systolic heart failure ([place]) 08/01/2015   GERD (gastroesophageal reflux disease) 07/29/2015   Osteoarthritis 05/03/2015   Hyperlipidemia 64/68/0321   Diastolic dysfunction 22/48/2500   Tobacco abuse 02/26/2015   OSA (obstructive sleep apnea) 12/27/2014   Encounter for well adult exam with abnormal findings 11/12/2014   Anemia in chronic kidney disease 11/12/2014   Left knee pain 09/22/2014   Paresthesia of left leg 37/06/8887   Acute systolic CHF (congestive heart failure) ([place]) 11/17/2013   Morbid obesity ([place]) 11/17/2013   Hyperglycemia 12/12/2011   Acute on chronic renal failure ([place]) 12/11/2011   HTN (hypertension) 12/11/2011   SOB (shortness of breath) 12/11/2011   Gout 12/31/2006   HERNIATED LUMBAR DISK WITH RADICULOPATHY 12/30/2006    Current Outpatient Medications on File Prior to Visit  Medication Sig Dispense Refill   acetaminophen (TYLENOL) 500 MG tablet Take 1,000 mg by mouth every 6 (six) hours as needed for moderate pain.     albuterol (VENTOLIN HFA) 108 (90 Base) MCG/ACT inhaler INHALE 2 PUFFS INTO THE LUNGS EVERY 6 HOURS AS NEEDED FOR WHEEZING OR SHORTNESS OF BREATH (Patient taking  differently: Inhale 2 puffs into the lungs every 6 (six) hours as needed for wheezing or shortness of breath.) 18 g 5   aspirin EC 81 MG tablet Take 81 mg by mouth daily.     carvedilol (COREG) 25 MG tablet TAKE 1 TABLET(25  MG) BY MOUTH TWICE DAILY 180 tablet 2   fenofibrate 54 MG tablet TAKE 1 TABLET BY MOUTH EVERY DAY 60 tablet 0   iron polysaccharides (NIFEREX) 150 MG capsule Take 1 capsule (150 mg total) by mouth daily. 30 capsule 0   iron sucrose in sodium chloride 0.9 % 100 mL Iron Sucrose (Venofer)     isosorbide mononitrate (IMDUR) 30 MG 24 hr tablet TAKE 2 TABLETS BY MOUTH DAILY. SCHEDULE OFFICE VISIT FOR FUTURE REFILLS 180 tablet 3   ketoconazole (NIZORAL) 2 % cream Apply 1 application topically daily. 60 g 2   lidocaine-prilocaine (EMLA) cream Apply 1 application topically 3 (three) times a week. MWF     nitroGLYCERIN (NITROSTAT) 0.4 MG SL tablet DISSOLVE 1 TABLET UNDER THE TONGUE EVERY 5 MINUTES AS NEEDED FOR CHEST PAIN (Patient taking differently: Place 0.4 mg under the tongue every 5 (five) minutes as needed for chest pain.) 25 tablet 3   pantoprazole (PROTONIX) 40 MG tablet Take 1 tablet (40 mg total) by mouth 2 (two) times daily before a meal. 180 tablet 3   Potassium Chloride ER 20 MEQ TBCR Take 1 tablet by mouth daily.     thiamine (VITAMIN B-1) 50 MG tablet Take 1 tablet (50 mg total) by mouth daily. 90 tablet 1   tiZANidine (ZANAFLEX) 2 MG tablet Take 1 tablet (2 mg total) by mouth daily. 30 tablet 0   vitamin B-12 (CYANOCOBALAMIN) 1000 MCG tablet Take 1,000 mcg by mouth daily.     No current facility-administered medications on file prior to visit.    Past Medical History:  Diagnosis Date   Anemia 06/2015   Arthritis    knee   Cancer ([place])    thyroid cancer   Cardiomyopathy ([place])    a. 10/2013: EF reduced to 35-40% b. 09/2014: EF improved to 55-60%, Grade 1 DD noted.   Chest pain 06/2015   CHF (congestive heart failure) (HCC)    CKD (chronic kidney disease), stage IV [facility])    sees  Dr Lorrene Reid   Dyspnea    with exerion    GERD (gastroesophageal reflux disease)    Gout    Hypertension    Obesity    Pancreatitis    Sleep apnea    not wearing c-pap now, needs new slep study per pt.  (01/22/2017)   Tuberculosis 1981   while the pt was in the service.   Tubular adenoma of colon 10/2015    Past Surgical History:  Procedure Laterality Date   AV FISTULA PLACEMENT Left 03/25/2016   Procedure: Creation of Left Arm ARTERIOVENOUS (AV) FISTULA;  Surgeon: Elam Dutch, MD;  Location: [facility] OR;  Service: Vascular;  Laterality: Left;   AV FISTULA PLACEMENT Left 08/27/2020   Procedure: LEFT BASILIC VEIN ARTERIOVENOUS (AV) FISTULA CREATION;  Surgeon: Rosetta Posner, MD;  Location: [place];  Service: Vascular;  Laterality: Left;   [place] Left 10/22/2020   Procedure: LEFT SECOND STAGE BASILIC VEIN TRANSPOSITION;  Surgeon: Elam Dutch, MD;  Location: [place];  Service: Vascular;  Laterality: Left;   COLONOSCOPY W/ BIOPSIES AND POLYPECTOMY     "no problem"   HERNIA REPAIR     IR FLUORO GUIDE CV  LINE RIGHT  08/22/2020   IR US GUIDE VASC ACCESS RIGHT  08/22/2020   LAPAROSCOPIC CHOLECYSTECTOMY     LIGATION OF COMPETING BRANCHES OF ARTERIOVENOUS FISTULA Left 01/25/2018   Procedure: LIGATION OF COMPETING BRANCHES And Revision of ARTERIOVENOUS FISTULA LEFT ARM.;  Surgeon: Elam Dutch, MD;  Location: [place];  Service: Vascular;  Laterality: Left;   THYROIDECTOMY  01/22/2017   THYROIDECTOMY Left 01/22/2017   Procedure: THYROIDECTOMY;  Surgeon: Melida Quitter, MD;  Location: [place];  Service: ENT;  Laterality: Left;   THYROIDECTOMY Right 03/26/2017   Procedure: COMPLETION OF THYROIDECTOMY;  Surgeon: Melida Quitter, MD;  Location: [facility];  Service: ENT;  Laterality: Right;   UMBILICAL HERNIA REPAIR     WISDOM TOOTH EXTRACTION      Social History   Socioeconomic History   Marital status: Divorced    Spouse name: Not on file   Number of children: 2   Years of education: 12   Highest education level: Not on file  Occupational History   Occupation: Disability    Comment: Knees   Tobacco Use   Smoking status: Every Day    Packs/day: 0.50    Years: 40.00    Pack years: 20.00     Types: Cigarettes   Smokeless tobacco: Never   Tobacco comments:    1/2 pack per day (03/25/2021)  Vaping Use   Vaping Use: Never used  Substance and Sexual Activity   Alcohol use: Not Currently    Alcohol/week: 0.0 standard drinks   Drug use: No   Sexual activity: Yes  Other Topics Concern   Not on file  Social History Narrative   Fun: Golf, basketball    Denies religious beliefs effecting health care.    Social Determinants of Health   Financial Resource Strain: Low Risk    Difficulty of Paying Living Expenses: Not hard at all  Food Insecurity: No Food Insecurity   Worried About Charity fundraiser in the Last Year: Never true   [place] in the Last Year: Never true  Transportation Needs: No Transportation Needs   Lack of Transportation (Medical): No   Lack of Transportation (Non-Medical): No  Physical Activity: Sufficiently Active   Days of Exercise per Week: 7 days   Minutes of Exercise per Session: 30 min  Stress: No Stress Concern Present   Feeling of Stress : Not at all  Social Connections: Moderately Integrated   Frequency of Communication with Friends and Family: More than three times a week   Frequency of Social Gatherings with Friends and Family: More than three times a week   Attends Religious Services: More than 4 times per year   Active Member of [employer] or Organizations: Yes   Attends Music therapist: More than 4 times per year   Marital Status: Never married    Family History  Problem Relation Age of Onset   Stroke Mother    Pneumonia Father        died of Pneumonia x3   Kidney disease Maternal Grandmother    Hypertension Maternal Grandmother    Healthy Maternal Grandfather    Healthy Paternal Grandmother    Healthy Paternal Grandfather    CAD Neg Hx    Colon cancer Neg Hx    Esophageal cancer Neg Hx    Pancreatic cancer Neg Hx    Prostate cancer Neg Hx    Stomach cancer Neg Hx    Rectal cancer Neg Hx  Review of  Systems     Objective:  There were no vitals filed for this visit. BP Readings from Last 3 Encounters:  03/12/21 (!) 127/110  01/28/21 122/76  01/24/21 140/70   Wt Readings from Last 3 Encounters:  01/28/21 258 lb (117 kg)  01/24/21 260 lb 3.2 oz (118 kg)  12/12/20 261 lb (118.4 kg)   There is no height or weight on file to calculate BMI.   Physical Exam    GENERAL APPEARANCE: Appears stated age, well appearing, NAD EYES: conjunctiva clear, no icterus HENT: bilateral tympanic membranes and ear canals normal, oropharynx with no erythema or exudates, trachea midline, no cervical or supraclavicular lymphadenopathy LUNGS: Unlabored breathing, good air entry bilaterally, clear to auscultation without wheeze or crackles CARDIOVASCULAR: Normal S1,S2 , no edema SKIN: Warm, dry      Assessment & Plan:    See Problem List for Assessment and Plan of chronic medical problems.

## 2021-04-02 ENCOUNTER — Ambulatory Visit: Payer: Medicare HMO | Admitting: Internal Medicine

## 2021-04-02 DIAGNOSIS — N186 End stage renal disease: Secondary | ICD-10-CM | POA: Diagnosis not present

## 2021-04-02 DIAGNOSIS — Z992 Dependence on renal dialysis: Secondary | ICD-10-CM | POA: Diagnosis not present

## 2021-04-02 DIAGNOSIS — N2581 Secondary hyperparathyroidism of renal origin: Secondary | ICD-10-CM | POA: Diagnosis not present

## 2021-04-04 DIAGNOSIS — N186 End stage renal disease: Secondary | ICD-10-CM | POA: Diagnosis not present

## 2021-04-04 DIAGNOSIS — Z992 Dependence on renal dialysis: Secondary | ICD-10-CM | POA: Diagnosis not present

## 2021-04-04 DIAGNOSIS — N2581 Secondary hyperparathyroidism of renal origin: Secondary | ICD-10-CM | POA: Diagnosis not present

## 2021-04-07 DIAGNOSIS — Z992 Dependence on renal dialysis: Secondary | ICD-10-CM | POA: Diagnosis not present

## 2021-04-07 DIAGNOSIS — N2581 Secondary hyperparathyroidism of renal origin: Secondary | ICD-10-CM | POA: Diagnosis not present

## 2021-04-07 DIAGNOSIS — N186 End stage renal disease: Secondary | ICD-10-CM | POA: Diagnosis not present

## 2021-04-09 DIAGNOSIS — N186 End stage renal disease: Secondary | ICD-10-CM | POA: Diagnosis not present

## 2021-04-09 DIAGNOSIS — Z992 Dependence on renal dialysis: Secondary | ICD-10-CM | POA: Diagnosis not present

## 2021-04-09 DIAGNOSIS — N2581 Secondary hyperparathyroidism of renal origin: Secondary | ICD-10-CM | POA: Diagnosis not present

## 2021-04-10 ENCOUNTER — Other Ambulatory Visit: Payer: Self-pay | Admitting: Registered Nurse

## 2021-04-10 DIAGNOSIS — R519 Headache, unspecified: Secondary | ICD-10-CM

## 2021-04-11 DIAGNOSIS — N186 End stage renal disease: Secondary | ICD-10-CM | POA: Diagnosis not present

## 2021-04-11 DIAGNOSIS — Z992 Dependence on renal dialysis: Secondary | ICD-10-CM | POA: Diagnosis not present

## 2021-04-11 DIAGNOSIS — N2581 Secondary hyperparathyroidism of renal origin: Secondary | ICD-10-CM | POA: Diagnosis not present

## 2021-04-14 DIAGNOSIS — N2581 Secondary hyperparathyroidism of renal origin: Secondary | ICD-10-CM | POA: Diagnosis not present

## 2021-04-14 DIAGNOSIS — N186 End stage renal disease: Secondary | ICD-10-CM | POA: Diagnosis not present

## 2021-04-14 DIAGNOSIS — Z992 Dependence on renal dialysis: Secondary | ICD-10-CM | POA: Diagnosis not present

## 2021-04-16 DIAGNOSIS — N186 End stage renal disease: Secondary | ICD-10-CM | POA: Diagnosis not present

## 2021-04-16 DIAGNOSIS — Z992 Dependence on renal dialysis: Secondary | ICD-10-CM | POA: Diagnosis not present

## 2021-04-16 DIAGNOSIS — N2581 Secondary hyperparathyroidism of renal origin: Secondary | ICD-10-CM | POA: Diagnosis not present

## 2021-04-18 DIAGNOSIS — Z992 Dependence on renal dialysis: Secondary | ICD-10-CM | POA: Diagnosis not present

## 2021-04-18 DIAGNOSIS — N186 End stage renal disease: Secondary | ICD-10-CM | POA: Diagnosis not present

## 2021-04-18 DIAGNOSIS — N2581 Secondary hyperparathyroidism of renal origin: Secondary | ICD-10-CM | POA: Diagnosis not present

## 2021-04-21 DIAGNOSIS — Z992 Dependence on renal dialysis: Secondary | ICD-10-CM | POA: Diagnosis not present

## 2021-04-21 DIAGNOSIS — N186 End stage renal disease: Secondary | ICD-10-CM | POA: Diagnosis not present

## 2021-04-21 DIAGNOSIS — N2581 Secondary hyperparathyroidism of renal origin: Secondary | ICD-10-CM | POA: Diagnosis not present

## 2021-04-22 DIAGNOSIS — Z992 Dependence on renal dialysis: Secondary | ICD-10-CM | POA: Diagnosis not present

## 2021-04-22 DIAGNOSIS — N186 End stage renal disease: Secondary | ICD-10-CM | POA: Diagnosis not present

## 2021-04-22 DIAGNOSIS — I129 Hypertensive chronic kidney disease with stage 1 through stage 4 chronic kidney disease, or unspecified chronic kidney disease: Secondary | ICD-10-CM | POA: Diagnosis not present

## 2021-04-23 DIAGNOSIS — N2581 Secondary hyperparathyroidism of renal origin: Secondary | ICD-10-CM | POA: Diagnosis not present

## 2021-04-23 DIAGNOSIS — N186 End stage renal disease: Secondary | ICD-10-CM | POA: Diagnosis not present

## 2021-04-23 DIAGNOSIS — Z992 Dependence on renal dialysis: Secondary | ICD-10-CM | POA: Diagnosis not present

## 2021-04-25 DIAGNOSIS — N186 End stage renal disease: Secondary | ICD-10-CM | POA: Diagnosis not present

## 2021-04-25 DIAGNOSIS — Z992 Dependence on renal dialysis: Secondary | ICD-10-CM | POA: Diagnosis not present

## 2021-04-25 DIAGNOSIS — N2581 Secondary hyperparathyroidism of renal origin: Secondary | ICD-10-CM | POA: Diagnosis not present

## 2021-04-28 DIAGNOSIS — N2581 Secondary hyperparathyroidism of renal origin: Secondary | ICD-10-CM | POA: Diagnosis not present

## 2021-04-28 DIAGNOSIS — N186 End stage renal disease: Secondary | ICD-10-CM | POA: Diagnosis not present

## 2021-04-28 DIAGNOSIS — Z992 Dependence on renal dialysis: Secondary | ICD-10-CM | POA: Diagnosis not present

## 2021-04-30 DIAGNOSIS — N186 End stage renal disease: Secondary | ICD-10-CM | POA: Diagnosis not present

## 2021-04-30 DIAGNOSIS — Z992 Dependence on renal dialysis: Secondary | ICD-10-CM | POA: Diagnosis not present

## 2021-04-30 DIAGNOSIS — N2581 Secondary hyperparathyroidism of renal origin: Secondary | ICD-10-CM | POA: Diagnosis not present

## 2021-05-02 DIAGNOSIS — N186 End stage renal disease: Secondary | ICD-10-CM | POA: Diagnosis not present

## 2021-05-02 DIAGNOSIS — Z992 Dependence on renal dialysis: Secondary | ICD-10-CM | POA: Diagnosis not present

## 2021-05-02 DIAGNOSIS — N2581 Secondary hyperparathyroidism of renal origin: Secondary | ICD-10-CM | POA: Diagnosis not present

## 2021-05-05 DIAGNOSIS — Z992 Dependence on renal dialysis: Secondary | ICD-10-CM | POA: Diagnosis not present

## 2021-05-05 DIAGNOSIS — N2581 Secondary hyperparathyroidism of renal origin: Secondary | ICD-10-CM | POA: Diagnosis not present

## 2021-05-05 DIAGNOSIS — N186 End stage renal disease: Secondary | ICD-10-CM | POA: Diagnosis not present

## 2021-05-06 DIAGNOSIS — H401122 Primary open-angle glaucoma, left eye, moderate stage: Secondary | ICD-10-CM | POA: Diagnosis not present

## 2021-05-07 ENCOUNTER — Ambulatory Visit (INDEPENDENT_AMBULATORY_CARE_PROVIDER_SITE_OTHER): Payer: Medicare HMO | Admitting: Ophthalmology

## 2021-05-07 ENCOUNTER — Telehealth: Payer: Self-pay | Admitting: Internal Medicine

## 2021-05-07 ENCOUNTER — Encounter (INDEPENDENT_AMBULATORY_CARE_PROVIDER_SITE_OTHER): Payer: Medicare HMO | Admitting: Ophthalmology

## 2021-05-07 ENCOUNTER — Other Ambulatory Visit: Payer: Self-pay

## 2021-05-07 ENCOUNTER — Encounter (INDEPENDENT_AMBULATORY_CARE_PROVIDER_SITE_OTHER): Payer: Self-pay | Admitting: Ophthalmology

## 2021-05-07 DIAGNOSIS — N2581 Secondary hyperparathyroidism of renal origin: Secondary | ICD-10-CM | POA: Diagnosis not present

## 2021-05-07 DIAGNOSIS — H401122 Primary open-angle glaucoma, left eye, moderate stage: Secondary | ICD-10-CM | POA: Diagnosis not present

## 2021-05-07 DIAGNOSIS — I10 Essential (primary) hypertension: Secondary | ICD-10-CM

## 2021-05-07 DIAGNOSIS — Z992 Dependence on renal dialysis: Secondary | ICD-10-CM | POA: Diagnosis not present

## 2021-05-07 DIAGNOSIS — H25813 Combined forms of age-related cataract, bilateral: Secondary | ICD-10-CM

## 2021-05-07 DIAGNOSIS — H34231 Retinal artery branch occlusion, right eye: Secondary | ICD-10-CM | POA: Diagnosis not present

## 2021-05-07 DIAGNOSIS — N186 End stage renal disease: Secondary | ICD-10-CM | POA: Diagnosis not present

## 2021-05-07 DIAGNOSIS — H35033 Hypertensive retinopathy, bilateral: Secondary | ICD-10-CM

## 2021-05-07 NOTE — Telephone Encounter (Signed)
Ok to assist pt with appt next available

## 2021-05-07 NOTE — Progress Notes (Signed)
[facility] Note  05/07/2021     CHIEF COMPLAINT Patient presents for Retina Evaluation   HISTORY OF PRESENT ILLNESS: Craig Flynn is a 61 y.o. male who presents to the clinic today for:   HPI     Retina Evaluation   In right eye.  This started 6 days ago.  Duration of 6 days.  I, the attending physician,  performed the HPI with the patient and updated documentation appropriately.        Comments   New pt here for BRVO OD referral from Dr. [place]. Pt states about 6 days ago he noticed a spot in OD where vision is out. Varies in color, cannot see through it. Pt is on dialysis (MWF) and reports around the same time he noticed the spot his blood sugar dropped to around 58. Pt is not diabetic. Pt reports hx of hypertension, 10-20 years. Pt does have [place] OS, was prescribed gtts yesterday but has not picked them up. Will be taking Latanaprost QHS OS.       Last edited by Bernarda Caffey, MD on 05/07/2021 12:15 PM.    Pt is here on the referral of Dr. Sherral Hammers for concern of BRAO, pt states 6 days ago, he was on dialysis and asleep, when he woke up, he felt kind of funny and noticed a spot in his vision, he states at the time his blood sugar had dropped to 58, but his blood pressure was normal, pt states he ate pigs feet for Thanksgiving last year and got pancreatitis, he started dialysis right after that, he denies being diabetic, he has CHF, no hx of heart attack, blood clots and stents, he takes a baby aspirin daily  Referring physician: [place] [facility] [address] [place],  [place] [id number]  HISTORICAL INFORMATION:   Selected notes from the MEDICAL RECORD NUMBER Referred by Dr. Darcey Nora for concern of BRAO LEE:  Ocular Hx- PMH-    CURRENT MEDICATIONS: No current outpatient medications on file. (Ophthalmic Drugs)   No current facility-administered medications for this visit. (Ophthalmic Drugs)   Current Outpatient  Medications (Other)  Medication Sig   acetaminophen (TYLENOL) 500 MG tablet Take 1,000 mg by mouth every 6 (six) hours as needed for moderate pain.   albuterol (VENTOLIN HFA) 108 (90 Base) MCG/ACT inhaler INHALE 2 PUFFS INTO THE LUNGS EVERY 6 HOURS AS NEEDED FOR WHEEZING OR SHORTNESS OF BREATH (Patient taking differently: Inhale 2 puffs into the lungs every 6 (six) hours as needed for wheezing or shortness of breath.)   aspirin EC 81 MG tablet Take 81 mg by mouth daily.   carvedilol (COREG) 25 MG tablet TAKE 1 TABLET(25 MG) BY MOUTH TWICE DAILY   fenofibrate 54 MG tablet TAKE 1 TABLET BY MOUTH EVERY DAY   furosemide (LASIX) 20 MG tablet Take 20 mg by mouth 2 (two) times daily.   iron sucrose in sodium chloride 0.9 % 100 mL Iron Sucrose (Venofer)   isosorbide mononitrate (IMDUR) 30 MG 24 hr tablet TAKE 2 TABLETS BY MOUTH DAILY. SCHEDULE OFFICE VISIT FOR FUTURE REFILLS   ketoconazole (NIZORAL) 2 % cream Apply 1 application topically daily.   lidocaine-prilocaine (EMLA) cream Apply 1 application topically 3 (three) times a week. MWF   nitroGLYCERIN (NITROSTAT) 0.4 MG SL tablet DISSOLVE 1 TABLET UNDER THE TONGUE EVERY 5 MINUTES AS NEEDED FOR CHEST PAIN (Patient taking differently: Place 0.4 mg under the tongue every 5 (five)  minutes as needed for chest pain.)   Potassium Chloride ER 20 MEQ TBCR Take 1 tablet by mouth daily.   vitamin B-12 (CYANOCOBALAMIN) 1000 MCG tablet Take 1,000 mcg by mouth daily.   iron polysaccharides (NIFEREX) 150 MG capsule Take 1 capsule (150 mg total) by mouth daily. (Patient not taking: Reported on 05/07/2021)   pantoprazole (PROTONIX) 40 MG tablet Take 1 tablet (40 mg total) by mouth 2 (two) times daily before a meal. (Patient not taking: Reported on 05/07/2021)   thiamine (VITAMIN B-1) 50 MG tablet Take 1 tablet (50 mg total) by mouth daily. (Patient not taking: Reported on 05/07/2021)   tiZANidine (ZANAFLEX) 2 MG tablet TAKE 1 TABLET(2 MG) BY MOUTH DAILY (Patient not taking:  Reported on 05/07/2021)   No current facility-administered medications for this visit. (Other)      REVIEW OF SYSTEMS: ROS   Positive for: Genitourinary, Endocrine, Cardiovascular, Eyes Negative for: Constitutional, Gastrointestinal, Neurological, Skin, Musculoskeletal, HENT, Respiratory, Psychiatric, Allergic/Imm, Heme/Lymph Last edited by Kingsley Spittle, COT on 05/07/2021  8:12 AM.       ALLERGIES No Known Allergies  PAST MEDICAL HISTORY Past Medical History:  Diagnosis Date   Anemia 06/2015   Arthritis    knee   Cancer ([place])    thyroid cancer   Cardiomyopathy ([place])    a. 10/2013: EF reduced to 35-40% b. 09/2014: EF improved to 55-60%, Grade 1 DD noted.   Chest pain 06/2015   CHF (congestive heart failure) (HCC)    CKD (chronic kidney disease), stage IV [facility])    sees  Dr Lorrene Reid   Dyspnea    with exerion    GERD (gastroesophageal reflux disease)    Glaucoma    Gout    Hypertension    Obesity    Pancreatitis    Sleep apnea    not wearing c-pap now, needs new slep study per pt. (01/22/2017)   Tuberculosis 1981   while the pt was in the service.   Tubular adenoma of colon 10/2015   Past Surgical History:  Procedure Laterality Date   AV FISTULA PLACEMENT Left 03/25/2016   Procedure: Creation of Left Arm ARTERIOVENOUS (AV) FISTULA;  Surgeon: Elam Dutch, MD;  Location: [place];  Service: Vascular;  Laterality: Left;   AV FISTULA PLACEMENT Left 08/27/2020   Procedure: LEFT BASILIC VEIN ARTERIOVENOUS (AV) FISTULA CREATION;  Surgeon: Rosetta Posner, MD;  Location: [place];  Service: Vascular;  Laterality: Left;   [place] Left 10/22/2020   Procedure: LEFT SECOND STAGE BASILIC VEIN TRANSPOSITION;  Surgeon: Elam Dutch, MD;  Location: [place];  Service: Vascular;  Laterality: Left;   COLONOSCOPY W/ BIOPSIES AND POLYPECTOMY     "no problem"   HERNIA REPAIR     IR FLUORO GUIDE CV LINE RIGHT  08/22/2020   IR US GUIDE VASC ACCESS RIGHT  08/22/2020    LAPAROSCOPIC CHOLECYSTECTOMY     LIGATION OF COMPETING BRANCHES OF ARTERIOVENOUS FISTULA Left 01/25/2018   Procedure: LIGATION OF COMPETING BRANCHES And Revision of ARTERIOVENOUS FISTULA LEFT ARM.;  Surgeon: Elam Dutch, MD;  Location: [place];  Service: Vascular;  Laterality: Left;   THYROIDECTOMY  01/22/2017   THYROIDECTOMY Left 01/22/2017   Procedure: THYROIDECTOMY;  Surgeon: Melida Quitter, MD;  Location: [place];  Service: ENT;  Laterality: Left;   THYROIDECTOMY Right 03/26/2017   Procedure: COMPLETION OF THYROIDECTOMY;  Surgeon: Melida Quitter, MD;  Location: [place];  Service: ENT;  Laterality: Right;   UMBILICAL HERNIA REPAIR  WISDOM TOOTH EXTRACTION      FAMILY HISTORY Family History  Problem Relation Age of Onset   Diabetes Mother    Stroke Mother    Pneumonia Father        died of Pneumonia x3   Kidney disease Maternal Grandmother    Hypertension Maternal Grandmother    Healthy Maternal Grandfather    Healthy Paternal Grandmother    Healthy Paternal Grandfather    CAD Neg Hx    Colon cancer Neg Hx    Esophageal cancer Neg Hx    Pancreatic cancer Neg Hx    Prostate cancer Neg Hx    Stomach cancer Neg Hx    Rectal cancer Neg Hx     SOCIAL HISTORY Social History   Tobacco Use   Smoking status: Former    Years: 40.00    Types: Cigarettes    Quit date: 05/06/2021   Smokeless tobacco: Never   Tobacco comments:    1/2 pack per day (03/25/2021)  Vaping Use   Vaping Use: Never used  Substance Use Topics   Alcohol use: Not Currently    Alcohol/week: 0.0 standard drinks   Drug use: No         OPHTHALMIC EXAM:  Base Eye Exam     Visual Acuity (Snellen - Linear)       Right Left   Dist cc 20/70 -2 20/40   Dist ph cc 20/50 -2 NI    Correction: Glasses         Tonometry (Tonopen, 8:32 AM)       Right Left   Pressure 18 18         Pupils       Dark Light Shape React APD   Right 5 4 Round Brisk None   Left 5 4 Round Brisk None         Visual  Fields (Counting fingers)       Left Right    Full    Restrictions  Partial inner superior temporal deficiency         Extraocular Movement       Right Left    Full, Ortho Full, Ortho         Neuro/Psych     Oriented x3: Yes   Mood/Affect: Normal         Dilation     Both eyes: 1.0% Mydriacyl, 2.5% Phenylephrine @ 8:34 AM           Slit Lamp and Fundus Exam     Slit Lamp Exam       Right Left   Lids/Lashes Dermatochalasis - upper lid Dermatochalasis - upper lid, Ptosis   Conjunctiva/Sclera Melanosis Melanosis   Cornea arcus, trace PEE arcus, trace PEE   Anterior Chamber Deep and quiet Deep and quiet   Iris Round and dilated, No NVI Round and dilated, No NVI   Lens 2+ Nuclear sclerosis, 2+ Cortical cataract 2+ Nuclear sclerosis, 2+ Cortical cataract   Anterior Vitreous Vitreous syneresis Vitreous syneresis         Fundus Exam       Right Left   Disc Pink and Sharp Pink and Sharp, +cupping   C/D Ratio 0.55 0.7   Macula Good foveal reflex, focal retinal whitening nasal to fovea -- BRAO, cilioretinal distribution, no heme Flat, Good foveal reflex, RPE mottling, No heme or edema   Vessels attenuated, Tortuous, no visible plaques attenuated, Tortuous   Periphery Attached, no heme Attached, mild mylenated NFL  nasal to disc           Refraction     Wearing Rx       Sphere Cylinder Axis Add   Right -3.75 +2.25 176 +2.00   Left -6.25 +1.25 011 +2.00         Manifest Refraction       Sphere Cylinder Axis Dist VA   Right -3.75 +2.25 180 20/60-2   Left --6.25 +1.25 045 20/40            IMAGING AND PROCEDURES  Imaging and Procedures for 05/07/2021  OCT, Retina - OU - Both Eyes       Right Eye Quality was good. Central Foveal Thickness: 273. Progression has no prior data. Findings include normal foveal contour, intraretinal hyper-reflective material, no IRF, no SRF (Focal IRHM and edema nasal macula -- hyper reflective loss of lamination,  inner retinal layers -- BRAO).   Left Eye Quality was good. Central Foveal Thickness: 265. Progression has no prior data. Findings include normal foveal contour, no IRF, no SRF, vitreomacular adhesion .   Notes *Images captured and stored on drive  Diagnosis / Impression:  OD: Focal IRHM and edema nasal macula -- hyper reflective loss of lamination, inner retinal layers -- BRAO OS: NFP, no IRF/SRF  Clinical management:  See below  Abbreviations: NFP - Normal foveal profile. CME - cystoid macular edema. PED - pigment epithelial detachment. IRF - intraretinal fluid. SRF - subretinal fluid. EZ - ellipsoid zone. ERM - epiretinal membrane. ORA - outer retinal atrophy. ORT - outer retinal tubulation. SRHM - subretinal hyper-reflective material. IRHM - intraretinal hyper-reflective material              ASSESSMENT/PLAN:    ICD-10-CM   1. Retinal artery branch occlusion of right eye  H34.231 OCT, Retina - OU - Both Eyes    2. Essential hypertension  I10     3. Hypertensive retinopathy of both eyes  H35.033     4. Primary open angle glaucoma (POAG) of left eye, moderate stage  H40.1122     5. Combined forms of age-related cataract of both eyes  H25.813       Branch Retinal Artery Occlusion, OD - pt reports acute onset of focal vision loss, temporal paracentral visual field while getting dialysis last week ~2.8 or 2.10.23 -- pt reports very low blood sugar, but normal BP at time of onset - exam showsfocal retinal whitening and edema nasal macula -- cilioretinal BRAO - BCVA 20/50 - OCT shows hyper reflective loss of lamination in the inner retinal layers, nasal macula consistent with BRAO - discussed findings, prognosis, and concern for potential similar occlusive lesions to the brain - pt denies headache, numbness, tingling, weakness or any other neurologic symptoms concerning for stroke / TIA - recommend carotic dopplers and repeat echocardiogram to rule out any embolic source  of BRAO; MRI brain to check for subclinical CVA / TIA - will discuss case and recommendations w/ pts PCP, Dr. Cathlean Cower - F/U 4 weeks -- DFE/OCT, FA transit OD  2,3. Hypertensive retinopathy OU - discussed importance of tight BP control - monitor  4. POAG OS  - IOP 18 OU  - pt starting latanoprost QHS per Dr. Sherral Hammers  5. Mixed Cataract OU - The symptoms of cataract, surgical options, and treatments and risks were discussed with patient. - discussed diagnosis and progression - monitor  Ophthalmic Meds Ordered this visit:  No orders of the defined types were placed in  this encounter.    Return in about 4 weeks (around 06/04/2021) for f/u BRAO OD, DFE, OCT, Fluorescein Angiogram.  There are no Patient Instructions on file for this visit.   Explained the diagnoses, plan, and follow up with the patient and they expressed understanding.  Patient expressed understanding of the importance of proper follow up care.   This document serves as a record of services personally performed by Gardiner Sleeper, MD, PhD. It was created on their behalf by San Jetty. Owens Shark, OA an ophthalmic technician. The creation of this record is the provider's dictation and/or activities during the visit.    Electronically signed by: San Jetty. Marguerita Merles 02.15.2023 12:27 PM  Gardiner Sleeper, M.D., Ph.D. Diseases & Surgery of the Retina and Vitreous [facility]  I have reviewed the above documentation for accuracy and completeness, and I agree with the above. Gardiner Sleeper, M.D., Ph.D. 05/07/21 12:27 PM   Abbreviations: M myopia (nearsighted); A astigmatism; H hyperopia (farsighted); P presbyopia; Mrx spectacle prescription;  CTL contact lenses; OD right eye; OS left eye; OU both eyes  XT exotropia; ET esotropia; PEK punctate epithelial keratitis; PEE punctate epithelial erosions; DES dry eye syndrome; MGD meibomian gland dysfunction; ATs artificial tears; PFAT's preservative free artificial  tears; [place] nuclear sclerotic cataract; PSC posterior subcapsular cataract; ERM epi-retinal membrane; PVD posterior vitreous detachment; RD retinal detachment; DM diabetes mellitus; DR diabetic retinopathy; NPDR non-proliferative diabetic retinopathy; PDR proliferative diabetic retinopathy; CSME clinically significant macular edema; DME diabetic macular edema; dbh dot blot hemorrhages; CWS cotton wool spot; POAG primary open angle glaucoma; C/D cup-to-disc ratio; HVF humphrey visual field; GVF goldmann visual field; OCT optical coherence tomography; IOP intraocular pressure; BRVO Branch retinal vein occlusion; CRVO central retinal vein occlusion; CRAO central retinal artery occlusion; BRAO branch retinal artery occlusion; RT retinal tear; SB scleral buckle; PPV pars plana vitrectomy; VH Vitreous hemorrhage; PRP panretinal laser photocoagulation; IVK intravitreal kenalog; VMT vitreomacular traction; MH Macular hole;  NVD neovascularization of the disc; NVE neovascularization elsewhere; AREDS age related eye disease study; ARMD age related macular degeneration; POAG primary open angle glaucoma; EBMD epithelial/anterior basement membrane dystrophy; ACIOL anterior chamber intraocular lens; IOL intraocular lens; PCIOL posterior chamber intraocular lens; Phaco/IOL phacoemulsification with intraocular lens placement; [place] photorefractive keratectomy; LASIK laser assisted in situ keratomileusis; HTN hypertension; DM diabetes mellitus; COPD chronic obstructive pulmonary disease

## 2021-05-07 NOTE — Telephone Encounter (Signed)
Pt came in w/ a visual field loss in R eye. Diagnosed w/ BRAO. Non urgent stroke work- up, possible.    Caller was connected to CMA to further discuss.

## 2021-05-07 NOTE — Telephone Encounter (Signed)
Patient scheduled for 05/13/21

## 2021-05-09 ENCOUNTER — Emergency Department (HOSPITAL_COMMUNITY)
Admission: EM | Admit: 2021-05-09 | Discharge: 2021-05-09 | Disposition: A | Discharge status: Home / Self Care | Payer: Medicare HMO | Attending: Emergency Medicine | Admitting: Emergency Medicine

## 2021-05-09 ENCOUNTER — Emergency Department (HOSPITAL_COMMUNITY): Payer: Medicare HMO

## 2021-05-09 ENCOUNTER — Encounter (HOSPITAL_COMMUNITY): Payer: Self-pay | Admitting: Emergency Medicine

## 2021-05-09 DIAGNOSIS — N186 End stage renal disease: Secondary | ICD-10-CM | POA: Insufficient documentation

## 2021-05-09 DIAGNOSIS — Z992 Dependence on renal dialysis: Secondary | ICD-10-CM | POA: Insufficient documentation

## 2021-05-09 DIAGNOSIS — Z7982 Long term (current) use of aspirin: Secondary | ICD-10-CM | POA: Insufficient documentation

## 2021-05-09 DIAGNOSIS — I132 Hypertensive heart and chronic kidney disease with heart failure and with stage 5 chronic kidney disease, or end stage renal disease: Secondary | ICD-10-CM | POA: Diagnosis not present

## 2021-05-09 DIAGNOSIS — Z79899 Other long term (current) drug therapy: Secondary | ICD-10-CM | POA: Diagnosis not present

## 2021-05-09 DIAGNOSIS — R519 Headache, unspecified: Secondary | ICD-10-CM | POA: Diagnosis not present

## 2021-05-09 DIAGNOSIS — I10 Essential (primary) hypertension: Secondary | ICD-10-CM | POA: Diagnosis not present

## 2021-05-09 DIAGNOSIS — I509 Heart failure, unspecified: Secondary | ICD-10-CM | POA: Insufficient documentation

## 2021-05-09 LAB — DIFFERENTIAL
Abs Immature Granulocytes: 0.01 10*3/uL (ref 0.00–0.07)
Basophils Absolute: 0 10*3/uL (ref 0.0–0.1)
Basophils Relative: 0 %
Eosinophils Absolute: 0.5 10*3/uL (ref 0.0–0.5)
Eosinophils Relative: 10 %
Immature Granulocytes: 0 %
Lymphocytes Relative: 27 %
Lymphs Abs: 1.4 10*3/uL (ref 0.7–4.0)
Monocytes Absolute: 0.5 10*3/uL (ref 0.1–1.0)
Monocytes Relative: 10 %
Neutro Abs: 2.8 10*3/uL (ref 1.7–7.7)
Neutrophils Relative %: 53 %

## 2021-05-09 LAB — COMPREHENSIVE METABOLIC PANEL
ALT: 9 U/L (ref 0–44)
AST: 10 U/L — ABNORMAL LOW (ref 15–41)
Albumin: 3.6 g/dL (ref 3.5–5.0)
Alkaline Phosphatase: 50 U/L (ref 38–126)
Anion gap: 14 (ref 5–15)
BUN: 29 mg/dL — ABNORMAL HIGH (ref 6–20)
CO2: 23 mmol/L (ref 22–32)
Calcium: 9.3 mg/dL (ref 8.9–10.3)
Chloride: 99 mmol/L (ref 98–111)
Creatinine, Ser: 11.41 mg/dL — ABNORMAL HIGH (ref 0.61–1.24)
GFR, Estimated: 5 mL/min — ABNORMAL LOW (ref >60)
Glucose, Bld: 92 mg/dL (ref 70–99)
Potassium: 3.8 mmol/L (ref 3.5–5.1)
Sodium: 136 mmol/L (ref 135–145)
Total Bilirubin: 0.4 mg/dL (ref 0.3–1.2)
Total Protein: 6.1 g/dL — ABNORMAL LOW (ref 6.5–8.1)

## 2021-05-09 LAB — CBC
HCT: 35.8 % — ABNORMAL LOW (ref 39.0–52.0)
Hemoglobin: 11.2 g/dL — ABNORMAL LOW (ref 13.0–17.0)
MCH: 26.6 pg (ref 26.0–34.0)
MCHC: 31.3 g/dL (ref 30.0–36.0)
MCV: 85 fL (ref 80.0–100.0)
Platelets: 125 10*3/uL — ABNORMAL LOW (ref 150–400)
RBC: 4.21 MIL/uL — ABNORMAL LOW (ref 4.22–5.81)
RDW: 16.6 % — ABNORMAL HIGH (ref 11.5–15.5)
WBC: 5.1 10*3/uL (ref 4.0–10.5)
nRBC: 0 % (ref 0.0–0.2)

## 2021-05-09 LAB — APTT: aPTT: 29 seconds (ref 24–36)

## 2021-05-09 LAB — PROTIME-INR
INR: 1.1 (ref 0.8–1.2)
Prothrombin Time: 13.7 seconds (ref 11.4–15.2)

## 2021-05-09 MED ORDER — OXYCODONE-ACETAMINOPHEN 5-325 MG PO TABS
1.0000 | ORAL_TABLET | Freq: Once | ORAL | Status: AC
  Administered 2021-05-09: 1 via ORAL
  Filled 2021-05-09: qty 1

## 2021-05-09 MED ORDER — OXYCODONE-ACETAMINOPHEN 5-325 MG PO TABS: 1.0000 | ORAL_TABLET | Freq: Four times a day (QID) | ORAL | 0 refills | Status: DC | PRN

## 2021-05-09 MED ORDER — TETRACAINE HCL 0.5 % OP SOLN
2.0000 [drp] | Freq: Once | OPHTHALMIC | Status: AC
  Administered 2021-05-09: 2 [drp] via OPHTHALMIC
  Filled 2021-05-09: qty 4

## 2021-05-09 MED ORDER — MORPHINE SULFATE (PF) 4 MG/ML IV SOLN
4.0000 mg | Freq: Once | INTRAVENOUS | Status: DC
Start: 2021-05-09 — End: 2021-05-09

## 2021-05-09 NOTE — ED Provider Triage Note (Cosign Needed)
Emergency Medicine Provider Triage Evaluation Note  Craig Flynn , a 61 y.o. male  was evaluated in triage.  Pt complains of headache and right sided vision changes. States that same began suddenly 1 week ago. Went to ophthalmology who told him that his eye appeared normal from their standpoint but sent him here with concern that he had a stroke.  Headache is on the right side with associated right side vision changes. States that he sees a red spot in the center of his vision. No hx of same or hx of strokes.  Review of Systems  Positive: Headache, vision changes Negative: Fever, chills, nausea, vomiting  Physical Exam  BP 129/60 (BP Location: Right Arm)    Pulse 82    Temp 99.3 F (37.4 C) (Oral)    Resp 16    SpO2 100%  Gen:   Awake, no distress   Resp:  Normal effort  MSK:   Moves extremities without difficulty  Other:  Alert and oriented and neurologically intact  Medical Decision Making  Medically screening exam initiated at 12:14 PM.  Appropriate orders placed.  Craig Flynn was informed that the remainder of the evaluation will be completed by another provider, this initial triage assessment does not replace that evaluation, and the importance of remaining in the ED until their evaluation is complete.     Craig Face, PA-C 05/09/21 1217

## 2021-05-09 NOTE — ED Triage Notes (Signed)
Pt here from home with c/o headache and some loss of vision in the right eye , started last Saturday,  went to [facility] doctor and was told he may need a ct of the head

## 2021-05-09 NOTE — ED Provider Notes (Signed)
[facility] Provider Note   CSN: [id number] Arrival date & time: 05/09/21  1040     History No chief complaint on file.   Craig Flynn is a 61 y.o. male.  Medical history includes hypertension, ESRD on dialysis, history of TB, history of thyroid cancer, glaucoma, CHF, obesity.  Patient presents the emergency department with complaint of right eye pain.  He states that about 2 weeks ago he started noticing pain behind his right eye that extended into his occipital region.  This has gradually worsened since then.  He states last Friday during dialysis he had sudden onset of vision loss in his right eye.  He describes it as central vision loss.  He does still have his peripheral vision intact and has no vision loss in his left eye.  He was seen by the ophthalmologist on Monday noted of doing exam recommending he go see a retinal specialist.  He saw the retinal specialist yesterday, and was diagnosed with a retinal occlusion.  Since patient was not having any neurological findings, he was recommended to have an outpatient stroke work-up with echo, carotid Dopplers, and MRI.  He was also put on an eyedrop to help with the pressure in his eyes.  He needed to follow-up with his PCP with these results.  He ended up presenting to the emergency department because the right eye pain was not getting any better with eyedrops that he was prescribed. Patient continues to deny any motor weakness or numbness, speech changes, worsening vision changes, or swallowing difficulties.  HPI     Home Medications Prior to Admission medications   Medication Sig Start Date End Date Taking? Authorizing Provider  oxyCODONE-acetaminophen (PERCOCET/ROXICET) 5-325 MG tablet Take 1 tablet by mouth every 6 (six) hours as needed for severe pain. 05/09/21  Yes Shahara Hartsfield, Adora Fridge, PA-C  acetaminophen (TYLENOL) 500 MG tablet Take 1,000 mg by mouth every 6 (six) hours as needed for moderate pain.     [provider]  albuterol (VENTOLIN HFA) 108 (90 Base) MCG/ACT inhaler INHALE 2 PUFFS INTO THE LUNGS EVERY 6 HOURS AS NEEDED FOR WHEEZING OR SHORTNESS OF BREATH Patient taking differently: Inhale 2 puffs into the lungs every 6 (six) hours as needed for wheezing or shortness of breath. 08/01/20   Biagio Borg, MD  aspirin EC 81 MG tablet Take 81 mg by mouth daily.    [provider]  carvedilol (COREG) 25 MG tablet TAKE 1 TABLET(25 MG) BY MOUTH TWICE DAILY 12/05/20   Biagio Borg, MD  fenofibrate 54 MG tablet TAKE 1 TABLET BY MOUTH EVERY DAY 08/07/20   Minus Breeding, MD  furosemide (LASIX) 20 MG tablet Take 20 mg by mouth 2 (two) times daily.    [provider]  iron polysaccharides (NIFEREX) 150 MG capsule Take 1 capsule (150 mg total) by mouth daily. Patient not taking: Reported on 05/07/2021 08/28/20   Patrecia Pour, MD  iron sucrose in sodium chloride 0.9 % 100 mL Iron Sucrose (Venofer) 09/25/20 09/17/21  [provider]  isosorbide mononitrate (IMDUR) 30 MG 24 hr tablet TAKE 2 TABLETS BY MOUTH DAILY. SCHEDULE OFFICE VISIT FOR FUTURE REFILLS 02/05/21   Minus Breeding, MD  ketoconazole (NIZORAL) 2 % cream Apply 1 application topically daily. 02/11/21   Lorenda Peck, MD  lidocaine-prilocaine (EMLA) cream Apply 1 application topically 3 (three) times a week. MWF 11/14/20   [provider]  nitroGLYCERIN (NITROSTAT) 0.4 MG SL tablet DISSOLVE 1 TABLET  UNDER THE TONGUE EVERY 5 MINUTES AS NEEDED FOR CHEST PAIN Patient taking differently: Place 0.4 mg under the tongue every 5 (five) minutes as needed for chest pain. 03/29/19   Tami Lin, MD  pantoprazole (PROTONIX) 40 MG tablet Take 1 tablet (40 mg total) by mouth 2 (two) times daily before a meal. Patient not taking: Reported on 05/07/2021 01/17/21   Biagio Borg, MD  Potassium Chloride ER 20 MEQ TBCR Take 1 tablet by mouth daily. 11/26/20   [provider]  thiamine (VITAMIN B-1) 50 MG  tablet Take 1 tablet (50 mg total) by mouth daily. Patient not taking: Reported on 05/07/2021 03/25/20   Janith Lima, MD  tiZANidine (ZANAFLEX) 2 MG tablet TAKE 1 TABLET(2 MG) BY MOUTH DAILY Patient not taking: Reported on 05/07/2021 04/10/21   Maximiano Coss, NP  vitamin B-12 (CYANOCOBALAMIN) 1000 MCG tablet Take 1,000 mcg by mouth daily.    [provider]      Allergies    Patient has no known allergies.    Review of Systems   Review of Systems  Eyes:  Positive for pain and visual disturbance.  Neurological:  Positive for headaches. Negative for seizures, syncope, facial asymmetry, speech difficulty, weakness and numbness.  All other systems reviewed and are negative.  Physical Exam Updated Vital Signs BP 129/60 (BP Location: Right Arm)    Pulse 82    Temp 99.3 F (37.4 C) (Oral)    Resp 16    SpO2 100%  Physical Exam Vitals and nursing note reviewed.  Constitutional:      General: He is not in acute distress.    Appearance: Normal appearance. He is obese. He is not ill-appearing, toxic-appearing or diaphoretic.  HENT:     Head: Normocephalic and atraumatic.     Nose: Nose normal. No nasal deformity, congestion or rhinorrhea.     Mouth/Throat:     Lips: Pink. No lesions.     Mouth: Mucous membranes are moist. No injury, lacerations, oral lesions or angioedema.     Pharynx: Oropharynx is clear. Uvula midline. No pharyngeal swelling, oropharyngeal exudate, posterior oropharyngeal erythema or uvula swelling.  Eyes:     General: Gaze aligned appropriately. No scleral icterus.       Right eye: No discharge.        Left eye: No discharge.     Extraocular Movements: Extraocular movements intact.     Right eye: Normal extraocular motion and no nystagmus.     Left eye: Normal extraocular motion and no nystagmus.     Conjunctiva/sclera: Conjunctivae normal.     Right eye: Right conjunctiva is not injected. No chemosis, exudate or hemorrhage.    Left eye: Left conjunctiva is  not injected. No chemosis, exudate or hemorrhage.    Pupils: Pupils are equal, round, and reactive to light. Pupils are equal.  Cardiovascular:     Rate and Rhythm: Normal rate and regular rhythm.     Pulses: Normal pulses.          Radial pulses are 2+ on the right side and 2+ on the left side.       Dorsalis pedis pulses are 2+ on the right side and 2+ on the left side.     Heart sounds: Normal heart sounds, S1 normal and S2 normal. Heart sounds not distant. No murmur heard.   No friction rub. No gallop. No S3 or S4 sounds.  Pulmonary:     Effort: Pulmonary effort is normal. No  accessory muscle usage or respiratory distress.     Breath sounds: Normal breath sounds. No stridor. No wheezing, rhonchi or rales.  Chest:     Chest wall: No tenderness.  Abdominal:     General: Abdomen is flat. Bowel sounds are normal. There is no distension.     Palpations: Abdomen is soft. There is no mass or pulsatile mass.     Tenderness: There is no abdominal tenderness. There is no guarding or rebound.  Musculoskeletal:     Right lower leg: No edema.     Left lower leg: No edema.  Skin:    General: Skin is warm and dry.     Coloration: Skin is not jaundiced or pale.     Findings: No bruising, erythema, lesion or rash.  Neurological:     General: No focal deficit present.     Mental Status: He is alert and oriented to person, place, and time.     GCS: GCS eye subscore is 4. GCS verbal subscore is 5. GCS motor subscore is 6.     Comments: Alert and Oriented x 3 Speech clear with no aphasia Cranial Nerve testing - PERRLA. EOM intact. No Nystagmus - Facial Sensation grossly intact - No facial asymmetry - Uvula and Tongue Midline - Accessory Muscles intact Motor: - 5/5 motor strength in all four extremities.  Sensation: - Grossly intact in all four extremities.  Coordination:  - Finger to nose and heel to shin intact bilaterally   Psychiatric:        Mood and Affect: Mood normal.         Behavior: Behavior normal. Behavior is cooperative.    ED Results / Procedures / Treatments   Labs (all labs ordered are listed, but only abnormal results are displayed) Labs Reviewed  CBC - Abnormal; Notable for the following components:      Result Value   RBC 4.21 (*)    Hemoglobin 11.2 (*)    HCT 35.8 (*)    RDW 16.6 (*)    Platelets 125 (*)    All other components within normal limits  COMPREHENSIVE METABOLIC PANEL - Abnormal; Notable for the following components:   BUN 29 (*)    Creatinine, Ser 11.41 (*)    Total Protein 6.1 (*)    AST 10 (*)    GFR, Estimated 5 (*)    All other components within normal limits  PROTIME-INR  APTT  DIFFERENTIAL    EKG EKG Interpretation  Date/Time:  Friday May 09 2021 11:19:11 EST Ventricular Rate:  86 PR Interval:  178 QRS Duration: 88 QT Interval:  366 QTC Calculation: 437 R Axis:   36 Text Interpretation: Normal sinus rhythm Normal ECG When compared with ECG of 01-Dec-2020 12:02, PREVIOUS ECG IS PRESENT No significant change since last tracing Confirmed by Blanchie Dessert [phone]) on 05/09/2021 12:49:18 PM  Radiology CT Head Wo Contrast  Result Date: 05/09/2021 CLINICAL DATA:  Headache, sudden, severe headache with right sided blurred vision x 1 week EXAM: CT HEAD WITHOUT CONTRAST TECHNIQUE: Contiguous axial images were obtained from the base of the skull through the vertex without intravenous contrast. RADIATION DOSE REDUCTION: This exam was performed according to the departmental dose-optimization program which includes automated exposure control, adjustment of the mA and/or kV according to patient size and/or use of iterative reconstruction technique. COMPARISON:  MRI 03/28/2005.  CT head 03/27/2005. FINDINGS: Brain: No evidence of acute large vascular territory infarction, hemorrhage, hydrocephalus, extra-axial collection or mass lesion/mass effect. Moderate  patchy white matter hypoattenuation Vascular: No hyperdense vessel  identified. Calcific intracranial atherosclerosis. Skull: No evidence of acute fracture. Sinuses/Orbits: Remote left medial orbital wall fracture. Ethmoid air cell and frontal sinus mucosal thickening. Unremarkable orbits. Other: No mastoid effusions. IMPRESSION: 1. No evidence of acute intracranial abnormality by CT. 2. Moderate patchy white matter hypoattenuation, nonspecific but most likely related to related to accelerated chronic microvascular ischemic disease given the patient's risk factors. MRI could better evaluate if clinically indicated. Electronically Signed   By: Margaretha Sheffield M.D.   On: 05/09/2021 13:24    Procedures Procedures   Medications Ordered in ED Medications  tetracaine (PONTOCAINE) 0.5 % ophthalmic solution 2 drop (2 drops Right Eye Given by Other 05/09/21 1418)  oxyCODONE-acetaminophen (PERCOCET/ROXICET) 5-325 MG per tablet 1 tablet (1 tablet Oral Given 05/09/21 1417)    ED Course/ Medical Decision Making/ A&P Clinical Course as of 05/09/21 1944  Fri May 09, 2021  1445 This is a 61 year old male presents to the ED with right eye pain.  His pain has been ongoing for 2 weeks and his vision loss started 1 week ago.  He was just diagnosed with a retinal occlusion yesterday and was recommended to have outpatient neurological work-up done.  He does not have any worsening symptoms but only presents due to the pain.  I do not feel like he needs a in-depth neurological work-up today.  Initial CT is negative for any bleed.  Plan to check pressures in his eyes as well as give pain medication for symptoms. [GL]  1512 Right pressure 20 [GL]  1521 On reassessment following pain medication, headache has improved.  Patient has a follow-up appointment with ophthalmology on Monday and his PCP on Tuesday.  I will provide him with a short course of pain medication to get him through the weekend.  He also has planning to attend dialysis tomorrow since he missed it today.  No emergent needs for  dialysis at this time. [GL]    Clinical Course User Index [GL] Sherre Poot Adora Fridge, PA-C                           Medical Decision Making Amount and/or Complexity of Data Reviewed Labs: ordered.  Risk Prescription drug management.   This patient presents to the ED for concern of right eye pain, this involves an extensive number of treatment options, and is a complaint that carries with it a high risk of complications and morbidity.  The differential diagnosis includes Acute angle glaucoma, Retinal occlusion, SAH, ICH, etc.   This is a 60 year old male presents to the ED with right eye pain.  His pain has been ongoing for 2 weeks and his vision loss started 1 week ago.  He was just diagnosed with a retinal occlusion yesterday and was recommended to have outpatient neurological work-up done.  He does not have any worsening symptoms but only presents due to the pain.  I do not feel like he needs a in-depth neurological work-up today.  Initial CT is negative for any bleed.  Plan to check pressures in his eyes as well as give pain medication for symptoms  Eye pressure and right eyes 20 it is comparable to pressure in the left eye.  This is around what the pressure was at his last ophthalmology appointment.  On reassessment following pain medication, headache has improved.  Patient has a follow-up appointment with ophthalmology on Monday and his PCP on Tuesday.  I  will provide him with a short course of pain medication to get him through the weekend.  He also has planning to attend dialysis tomorrow since he missed it today.  No emergent needs for dialysis at this time. [GL]      Co morbidities that complicate the patient evaluation: hypertension, ESRD on dialysis, history of TB, history of thyroid cancer, glaucoma, CHF, obesity   Additional History:  External records from outside source obtained and reviewed including recent opthalmology results   Labs:  I Ordered, and personally interpreted  labs.  The pertinent results include:  Creat 11.41, but ESRD dialysis patient, stable anemia likely from chronic disease, stable thrombocytopenia   Imaging:  I ordered imaging studies including CT head I independently visualized and interpreted imaging which showed  1. No evidence of acute intracranial abnormality by CT.  2. Moderate patchy white matter hypoattenuation, nonspecific but  most likely related to related to accelerated chronic microvascular  ischemic disease given the patient's risk factors. MRI could better  evaluate if clinically indicated.   I agree with the radiologist interpretation   Cardiac Monitoring: The patient was maintained on a cardiac monitor.  I personally viewed and interpreted the cardiac monitored which showed an underlying rhythm of: NSR   Medications:   I ordered medication including Percocet  for headache Reevaluation of the patient after these medicines showed that the patient improved I have reviewed the patients home medicines and have made adjustments as needed    Reevaluation:  After the interventions noted above, I reevaluated the patient and found that they have :improved   Social Determinants of Health: n/a   Disposition:  After consideration of the diagnostic results and the patients response to treatment, I feel that the patent would benefit from f/u with opthalmologic, pain management..   Final Clinical Impression(s) / ED Diagnoses Final diagnoses:  Chronic intractable headache, unspecified headache type    Rx / DC Orders ED Discharge Orders          Ordered    oxyCODONE-acetaminophen (PERCOCET/ROXICET) 5-325 MG tablet  Every 6 hours PRN        05/09/21 1517              Adolphus Birchwood, PA-C 05/09/21 1944    Blanchie Dessert, MD 05/12/21 1920

## 2021-05-09 NOTE — Discharge Instructions (Signed)
You been seen in the emergency department for right eye pain.  Your CT head was negative for any bleeding.  The pressures in your eyes were stable.  You should be able to get the MRI, echo, and carotid Dopplers outpatient per your ophthalmologist request.  Make sure that you attend dialysis in the morning.  Please follow-up with your ophthalmologist on Monday and your PCP on Tuesday.

## 2021-05-10 DIAGNOSIS — N186 End stage renal disease: Secondary | ICD-10-CM | POA: Diagnosis not present

## 2021-05-10 DIAGNOSIS — N2581 Secondary hyperparathyroidism of renal origin: Secondary | ICD-10-CM | POA: Diagnosis not present

## 2021-05-10 DIAGNOSIS — Z992 Dependence on renal dialysis: Secondary | ICD-10-CM | POA: Diagnosis not present

## 2021-05-12 DIAGNOSIS — N186 End stage renal disease: Secondary | ICD-10-CM | POA: Diagnosis not present

## 2021-05-12 DIAGNOSIS — N2581 Secondary hyperparathyroidism of renal origin: Secondary | ICD-10-CM | POA: Diagnosis not present

## 2021-05-12 DIAGNOSIS — Z992 Dependence on renal dialysis: Secondary | ICD-10-CM | POA: Diagnosis not present

## 2021-05-13 ENCOUNTER — Ambulatory Visit (INDEPENDENT_AMBULATORY_CARE_PROVIDER_SITE_OTHER): Payer: Medicare HMO | Admitting: Internal Medicine

## 2021-05-13 ENCOUNTER — Encounter: Payer: Self-pay | Admitting: Internal Medicine

## 2021-05-13 ENCOUNTER — Other Ambulatory Visit: Payer: Self-pay

## 2021-05-13 VITALS — BP 128/60 | HR 67 | Temp 98.7°F | Ht 74.0 in | Wt 252.0 lb

## 2021-05-13 DIAGNOSIS — H349 Unspecified retinal vascular occlusion: Secondary | ICD-10-CM

## 2021-05-13 DIAGNOSIS — Z0001 Encounter for general adult medical examination with abnormal findings: Secondary | ICD-10-CM

## 2021-05-13 DIAGNOSIS — E559 Vitamin D deficiency, unspecified: Secondary | ICD-10-CM | POA: Diagnosis not present

## 2021-05-13 DIAGNOSIS — I10 Essential (primary) hypertension: Secondary | ICD-10-CM | POA: Diagnosis not present

## 2021-05-13 DIAGNOSIS — R739 Hyperglycemia, unspecified: Secondary | ICD-10-CM | POA: Diagnosis not present

## 2021-05-13 NOTE — Assessment & Plan Note (Signed)
Age and sex appropriate education and counseling updated with regular exercise and diet Referrals for preventative services - none needed Immunizations addressed - declines covid boster, tdap Smoking counseling  - none needed Evidence for depression or other mood disorder - none significant Most recent labs reviewed. I have personally reviewed and have noted: 1) the patient's medical and social history 2) The patient's current medications and supplements 3) The patient's height, weight, and BMI have been recorded in the chart

## 2021-05-13 NOTE — Assessment & Plan Note (Signed)
Last vitamin D Lab Results  Component Value Date   VD25OH 35.91 06/07/2020   Low, to start oral replacement  

## 2021-05-13 NOTE — Patient Instructions (Addendum)
Please continue all other medications as before, and refills have been done if requested.  Please have the pharmacy call with any other refills you may need.  Please continue your efforts at being more active, low cholesterol diet, and weight control.  You are otherwise up to date with prevention measures today.  Please keep your appointments with your specialists as you may have planned  You will be contacted regarding the referral for: MRI brain, Carotid artery testing, and Echocardiogram  Please make an Appointment to return Jun 12, 2021 as planned, or sooner if needed

## 2021-05-13 NOTE — Progress Notes (Signed)
Patient ID: Craig Flynn, male   DOB: September 20, 1960, 60 y.o.   MRN: [id number]         Chief Complaint:: wellness exam and Office Visit (Discuss retina eye results)  With HA       HPI:  Craig Flynn is a 61 y.o. male here for wellness exam; declines covid booster, tdap, a1c o/w up to date                        Also had very recent right retinal artery thrombosis per pt, here after seen in ED 2/17 with HA and spot in the vision now improved, but Head CT neg for acute.  Pt felt stable for further outpatient eval.  Pt denies chest pain, increased sob or doe, wheezing, orthopnea, PND, increased LE swelling, palpitations, dizziness or syncope.   Pt denies polydipsia, polyuria, or other new focal neuro s/s.   Pt denies fever, wt loss, night sweats, loss of appetite, or other constitutional symptoms  No other new complaints   Wt Readings from Last 3 Encounters:  05/13/21 252 lb (114.3 kg)  01/28/21 258 lb (117 kg)  01/24/21 260 lb 3.2 oz (118 kg)   BP Readings from Last 3 Encounters:  05/13/21 128/60  05/09/21 129/60  03/12/21 (!) 127/110   Immunization History  Administered Date(s) Administered   Influenza Whole 01/22/2008   PFIZER Comirnaty(Gray Top)Covid-19 Tri-Sucrose Vaccine 05/02/2020   PFIZER(Purple Top)SARS-COV-2 Vaccination 06/24/2019, 07/18/2019, 05/02/2020   Pneumococcal Polysaccharide-23 01/22/2008   There are no preventive care reminders to display for this patient.     Past Medical History:  Diagnosis Date   Anemia 06/2015   Arthritis    knee   Cancer ([place])    thyroid cancer   Cardiomyopathy ([place])    a. 10/2013: EF reduced to 35-40% b. 09/2014: EF improved to 55-60%, Grade 1 DD noted.   Chest pain 06/2015   CHF (congestive heart failure) (HCC)    CKD (chronic kidney disease), stage IV [facility])    sees  Dr Lorrene Reid   Dyspnea    with exerion    GERD (gastroesophageal reflux disease)    Glaucoma    Gout    Hypertension    Obesity    Pancreatitis    Sleep apnea     not wearing c-pap now, needs new slep study per pt. (01/22/2017)   Tuberculosis 1981   while the pt was in the service.   Tubular adenoma of colon 10/2015   Past Surgical History:  Procedure Laterality Date   AV FISTULA PLACEMENT Left 03/25/2016   Procedure: Creation of Left Arm ARTERIOVENOUS (AV) FISTULA;  Surgeon: Elam Dutch, MD;  Location: [facility] OR;  Service: Vascular;  Laterality: Left;   AV FISTULA PLACEMENT Left 08/27/2020   Procedure: LEFT BASILIC VEIN ARTERIOVENOUS (AV) FISTULA CREATION;  Surgeon: Rosetta Posner, MD;  Location: [place];  Service: Vascular;  Laterality: Left;   [place] Left 10/22/2020   Procedure: LEFT SECOND STAGE BASILIC VEIN TRANSPOSITION;  Surgeon: Elam Dutch, MD;  Location: [place];  Service: Vascular;  Laterality: Left;   COLONOSCOPY W/ BIOPSIES AND POLYPECTOMY     "no problem"   HERNIA REPAIR     IR FLUORO GUIDE CV LINE RIGHT  08/22/2020   IR US GUIDE VASC ACCESS RIGHT  08/22/2020   LAPAROSCOPIC CHOLECYSTECTOMY     LIGATION OF COMPETING BRANCHES OF ARTERIOVENOUS FISTULA Left 01/25/2018   Procedure: LIGATION OF COMPETING  BRANCHES And Revision of ARTERIOVENOUS FISTULA LEFT ARM.;  Surgeon: Elam Dutch, MD;  Location: [place];  Service: Vascular;  Laterality: Left;   THYROIDECTOMY  01/22/2017   THYROIDECTOMY Left 01/22/2017   Procedure: THYROIDECTOMY;  Surgeon: Melida Quitter, MD;  Location: [place];  Service: ENT;  Laterality: Left;   THYROIDECTOMY Right 03/26/2017   Procedure: COMPLETION OF THYROIDECTOMY;  Surgeon: Melida Quitter, MD;  Location: [place];  Service: ENT;  Laterality: Right;   UMBILICAL HERNIA REPAIR     WISDOM TOOTH EXTRACTION      reports that he quit smoking 7 days ago. His smoking use included cigarettes. He has never used smokeless tobacco. He reports that he does not currently use alcohol. He reports that he does not use drugs. family history includes Diabetes in his mother; Healthy in his maternal grandfather, paternal  grandfather, and paternal grandmother; Hypertension in his maternal grandmother; Kidney disease in his maternal grandmother; Pneumonia in his father; Stroke in his mother. No Known Allergies Current Outpatient Medications on File Prior to Visit  Medication Sig Dispense Refill   acetaminophen (TYLENOL) 500 MG tablet Take 1,000 mg by mouth every 6 (six) hours as needed for moderate pain.     albuterol (VENTOLIN HFA) 108 (90 Base) MCG/ACT inhaler INHALE 2 PUFFS INTO THE LUNGS EVERY 6 HOURS AS NEEDED FOR WHEEZING OR SHORTNESS OF BREATH (Patient taking differently: Inhale 2 puffs into the lungs every 6 (six) hours as needed for wheezing or shortness of breath.) 18 g 5   aspirin EC 81 MG tablet Take 81 mg by mouth daily.     carvedilol (COREG) 25 MG tablet TAKE 1 TABLET(25 MG) BY MOUTH TWICE DAILY 180 tablet 2   fenofibrate 54 MG tablet TAKE 1 TABLET BY MOUTH EVERY DAY 60 tablet 0   furosemide (LASIX) 20 MG tablet Take 20 mg by mouth 2 (two) times daily.     iron sucrose in sodium chloride 0.9 % 100 mL Iron Sucrose (Venofer)     isosorbide mononitrate (IMDUR) 30 MG 24 hr tablet TAKE 2 TABLETS BY MOUTH DAILY. SCHEDULE OFFICE VISIT FOR FUTURE REFILLS 180 tablet 3   ketoconazole (NIZORAL) 2 % cream Apply 1 application topically daily. 60 g 2   latanoprost (XALATAN) 0.005 % ophthalmic solution Place 1 drop into the left eye at bedtime.     lidocaine-prilocaine (EMLA) cream Apply 1 application topically 3 (three) times a week. MWF     nitroGLYCERIN (NITROSTAT) 0.4 MG SL tablet DISSOLVE 1 TABLET UNDER THE TONGUE EVERY 5 MINUTES AS NEEDED FOR CHEST PAIN (Patient taking differently: Place 0.4 mg under the tongue every 5 (five) minutes as needed for chest pain.) 25 tablet 3   oxyCODONE-acetaminophen (PERCOCET/ROXICET) 5-325 MG tablet Take 1 tablet by mouth every 6 (six) hours as needed for severe pain. 15 tablet 0   Potassium Chloride ER 20 MEQ TBCR Take 1 tablet by mouth daily.     vitamin B-12 (CYANOCOBALAMIN)  1000 MCG tablet Take 1,000 mcg by mouth daily.     iron polysaccharides (NIFEREX) 150 MG capsule Take 1 capsule (150 mg total) by mouth daily. (Patient not taking: Reported on 05/07/2021) 30 capsule 0   pantoprazole (PROTONIX) 40 MG tablet Take 1 tablet (40 mg total) by mouth 2 (two) times daily before a meal. (Patient not taking: Reported on 05/13/2021) 180 tablet 3   thiamine (VITAMIN B-1) 50 MG tablet Take 1 tablet (50 mg total) by mouth daily. (Patient not taking: Reported on 05/07/2021) 90 tablet  1   tiZANidine (ZANAFLEX) 2 MG tablet TAKE 1 TABLET(2 MG) BY MOUTH DAILY (Patient not taking: Reported on 05/07/2021) 30 tablet 0   No current facility-administered medications on file prior to visit.        ROS:  All others reviewed and negative.  Objective        PE:  BP 128/60 (BP Location: Right Arm, Patient Position: Sitting, Cuff Size: Large)    Pulse 67    Temp 98.7 F (37.1 C) (Oral)    Ht 6\' 2"  (1.88 m)    Wt 252 lb (114.3 kg)    SpO2 100%    BMI 32.35 kg/m                 Constitutional: Pt appears in NAD               HENT: Head: NCAT.                Right Ear: External ear normal.                 Left Ear: External ear normal.                Eyes: . Pupils are equal, round, and reactive to light. Conjunctivae and EOM are normal               Nose: without d/c or deformity               Neck: Neck supple. Gross normal ROM               Cardiovascular: Normal rate and regular rhythm.                 Pulmonary/Chest: Effort normal and breath sounds without rales or wheezing.                Abd:  Soft, NT, ND, + BS, no organomegaly               Neurological: Pt is alert. At baseline orientation, motor grossly intact               Skin: Skin is warm. No rashes, no other new lesions, LE edema - none               Psychiatric: Pt behavior is normal without agitation   Micro: none  Cardiac tracings I have personally interpreted today:  none  Pertinent Radiological findings  (summarize): none   Lab Results  Component Value Date   WBC 5.1 05/09/2021   HGB 11.2 (L) 05/09/2021   HCT 35.8 (L) 05/09/2021   PLT 125 (L) 05/09/2021   GLUCOSE 92 05/09/2021   CHOL 132 06/07/2020   TRIG 162 (H) 08/21/2020   HDL 30.40 (L) 06/07/2020   LDLCALC 72 06/07/2020   ALT 9 05/09/2021   AST 10 (L) 05/09/2021   NA 136 05/09/2021   K 3.8 05/09/2021   CL 99 05/09/2021   CREATININE 11.41 (H) 05/09/2021   BUN 29 (H) 05/09/2021   CO2 23 05/09/2021   TSH 0.50 06/07/2020   PSA 0.67 06/07/2020   INR 1.1 05/09/2021   HGBA1C 5.5 06/07/2020   Assessment/Plan:  ILEY BREEDEN is a 61 y.o. Black or African American [2] male with  has a past medical history of Anemia (06/2015), Arthritis, Cancer ([place]), Cardiomyopathy ([place]), Chest pain (06/2015), CHF (congestive heart failure) ([place]), CKD (chronic kidney disease), stage IV ([place]), Dyspnea, GERD (gastroesophageal reflux disease), Glaucoma, Gout,  Hypertension, Obesity, Pancreatitis, Sleep apnea, Tuberculosis (1981), and Tubular adenoma of colon (10/2015).  Encounter for well adult exam with abnormal findings Age and sex appropriate education and counseling updated with regular exercise and diet Referrals for preventative services - none needed Immunizations addressed - declines covid boster, tdap Smoking counseling  - none needed Evidence for depression or other mood disorder - none significant Most recent labs reviewed. I have personally reviewed and have noted: 1) the patient's medical and social history 2) The patient's current medications and supplements 3) The patient's height, weight, and BMI have been recorded in the chart   Retinal artery thrombosis, right Pt per hx, HA pain improved but vision impairment persists; now for f/u optho but also brain MRI, carotids and echo; cont asa 81 qd  HTN (hypertension) BP Readings from Last 3 Encounters:  05/13/21 128/60  05/09/21 129/60  03/12/21 (!) 127/110   Stable, pt to continue  medical treatment coreg  Hyperglycemia Lab Results  Component Value Date   HGBA1C 5.5 06/07/2020   Stable, pt to continue current medical treatment  - diet   Vitamin D deficiency Last vitamin D Lab Results  Component Value Date   VD25OH 35.91 06/07/2020   Low, to start oral replacement  Followup: Return in about 30 days (around 06/12/2021).  Cathlean Cower, MD 05/13/2021 9:29 PM [facility]

## 2021-05-13 NOTE — Assessment & Plan Note (Signed)
BP Readings from Last 3 Encounters:  05/13/21 128/60  05/09/21 129/60  03/12/21 (!) 127/110   Stable, pt to continue medical treatment coreg

## 2021-05-13 NOTE — Assessment & Plan Note (Signed)
Pt per hx, HA pain improved but vision impairment persists; now for f/u optho but also brain MRI, carotids and echo; cont asa 81 qd

## 2021-05-13 NOTE — Assessment & Plan Note (Signed)
Lab Results  Component Value Date   HGBA1C 5.5 06/07/2020   Stable, pt to continue current medical treatment  - diet

## 2021-05-14 DIAGNOSIS — N186 End stage renal disease: Secondary | ICD-10-CM | POA: Diagnosis not present

## 2021-05-14 DIAGNOSIS — N2581 Secondary hyperparathyroidism of renal origin: Secondary | ICD-10-CM | POA: Diagnosis not present

## 2021-05-14 DIAGNOSIS — Z992 Dependence on renal dialysis: Secondary | ICD-10-CM | POA: Diagnosis not present

## 2021-05-15 DIAGNOSIS — H401122 Primary open-angle glaucoma, left eye, moderate stage: Secondary | ICD-10-CM | POA: Diagnosis not present

## 2021-05-16 DIAGNOSIS — Z992 Dependence on renal dialysis: Secondary | ICD-10-CM | POA: Diagnosis not present

## 2021-05-16 DIAGNOSIS — N2581 Secondary hyperparathyroidism of renal origin: Secondary | ICD-10-CM | POA: Diagnosis not present

## 2021-05-16 DIAGNOSIS — N186 End stage renal disease: Secondary | ICD-10-CM | POA: Diagnosis not present

## 2021-05-19 DIAGNOSIS — N2581 Secondary hyperparathyroidism of renal origin: Secondary | ICD-10-CM | POA: Diagnosis not present

## 2021-05-19 DIAGNOSIS — N186 End stage renal disease: Secondary | ICD-10-CM | POA: Diagnosis not present

## 2021-05-19 DIAGNOSIS — Z992 Dependence on renal dialysis: Secondary | ICD-10-CM | POA: Diagnosis not present

## 2021-05-20 DIAGNOSIS — Z992 Dependence on renal dialysis: Secondary | ICD-10-CM | POA: Diagnosis not present

## 2021-05-20 DIAGNOSIS — N186 End stage renal disease: Secondary | ICD-10-CM | POA: Diagnosis not present

## 2021-05-20 DIAGNOSIS — I129 Hypertensive chronic kidney disease with stage 1 through stage 4 chronic kidney disease, or unspecified chronic kidney disease: Secondary | ICD-10-CM | POA: Diagnosis not present

## 2021-05-21 DIAGNOSIS — Z992 Dependence on renal dialysis: Secondary | ICD-10-CM | POA: Diagnosis not present

## 2021-05-21 DIAGNOSIS — N186 End stage renal disease: Secondary | ICD-10-CM | POA: Diagnosis not present

## 2021-05-21 DIAGNOSIS — N2581 Secondary hyperparathyroidism of renal origin: Secondary | ICD-10-CM | POA: Diagnosis not present

## 2021-05-23 ENCOUNTER — Other Ambulatory Visit: Payer: Self-pay

## 2021-05-23 ENCOUNTER — Ambulatory Visit (INDEPENDENT_AMBULATORY_CARE_PROVIDER_SITE_OTHER): Payer: Medicare HMO | Admitting: Internal Medicine

## 2021-05-23 ENCOUNTER — Encounter: Payer: Self-pay | Admitting: Internal Medicine

## 2021-05-23 VITALS — BP 110/80 | HR 69 | Temp 98.2°F | Ht 74.0 in | Wt 252.0 lb

## 2021-05-23 DIAGNOSIS — H349 Unspecified retinal vascular occlusion: Secondary | ICD-10-CM

## 2021-05-23 DIAGNOSIS — H93A1 Pulsatile tinnitus, right ear: Secondary | ICD-10-CM | POA: Diagnosis not present

## 2021-05-23 DIAGNOSIS — I10 Essential (primary) hypertension: Secondary | ICD-10-CM | POA: Diagnosis not present

## 2021-05-23 DIAGNOSIS — N186 End stage renal disease: Secondary | ICD-10-CM | POA: Diagnosis not present

## 2021-05-23 DIAGNOSIS — Z992 Dependence on renal dialysis: Secondary | ICD-10-CM | POA: Diagnosis not present

## 2021-05-23 DIAGNOSIS — N2581 Secondary hyperparathyroidism of renal origin: Secondary | ICD-10-CM | POA: Diagnosis not present

## 2021-05-23 DIAGNOSIS — R739 Hyperglycemia, unspecified: Secondary | ICD-10-CM | POA: Diagnosis not present

## 2021-05-23 MED ORDER — DIAZEPAM 5 MG PO TABS: ORAL_TABLET | ORAL | 0 refills | Status: DC

## 2021-05-23 NOTE — Progress Notes (Signed)
Patient ID: Craig Flynn, male   DOB: 1960-12-29, 60 y.o.   MRN: [id number]        Chief Complaint: follow up right eye symptoms, and 1 mo right ear pulsatile tinnitus       HPI:  Craig Flynn is a 61 y.o. male here to f/u after feb 23 appt had to be rescheduled to today, and MRI bran deferred to mar 9 due to schedule conflicts, with no further right eye symptoms of pain, visual symptoms, HA, dizziness, and Pt denies chest pain, increased sob or doe, wheezing, orthopnea, PND, increased LE swelling, palpitations, dizziness or syncope.   Pt denies polydipsia, polyuria, or new focal neuro s/s.   Does have however right ear fullness with pulsatile tinnitus without worsening HA, dizziness, hearing loss, drainage or vertigo.  Asks for valium 5 mg to get through the MRI claustrophobia.   Pt denies fever, wt loss, night sweats, loss of appetite, or other constitutional symptoms          Wt Readings from Last 3 Encounters:  05/23/21 252 lb (114.3 kg)  05/13/21 252 lb (114.3 kg)  01/28/21 258 lb (117 kg)   BP Readings from Last 3 Encounters:  05/23/21 110/80  05/13/21 128/60  05/09/21 129/60         Past Medical History:  Diagnosis Date   Anemia 06/2015   Arthritis    knee   Cancer ([place])    thyroid cancer   Cardiomyopathy ([place])    a. 10/2013: EF reduced to 35-40% b. 09/2014: EF improved to 55-60%, Grade 1 DD noted.   Chest pain 06/2015   CHF (congestive heart failure) (HCC)    CKD (chronic kidney disease), stage IV [facility])    sees  Dr Lorrene Reid   Dyspnea    with exerion    GERD (gastroesophageal reflux disease)    Glaucoma    Gout    Hypertension    Obesity    Pancreatitis    Sleep apnea    not wearing c-pap now, needs new slep study per pt. (01/22/2017)   Tuberculosis 1981   while the pt was in the service.   Tubular adenoma of colon 10/2015   Past Surgical History:  Procedure Laterality Date   AV FISTULA PLACEMENT Left 03/25/2016   Procedure: Creation of Left Arm ARTERIOVENOUS (AV)  FISTULA;  Surgeon: Elam Dutch, MD;  Location: [facility] OR;  Service: Vascular;  Laterality: Left;   AV FISTULA PLACEMENT Left 08/27/2020   Procedure: LEFT BASILIC VEIN ARTERIOVENOUS (AV) FISTULA CREATION;  Surgeon: Rosetta Posner, MD;  Location: [place];  Service: Vascular;  Laterality: Left;   [place] Left 10/22/2020   Procedure: LEFT SECOND STAGE BASILIC VEIN TRANSPOSITION;  Surgeon: Elam Dutch, MD;  Location: [place];  Service: Vascular;  Laterality: Left;   COLONOSCOPY W/ BIOPSIES AND POLYPECTOMY     "no problem"   HERNIA REPAIR     IR FLUORO GUIDE CV LINE RIGHT  08/22/2020   IR US GUIDE VASC ACCESS RIGHT  08/22/2020   LAPAROSCOPIC CHOLECYSTECTOMY     LIGATION OF COMPETING BRANCHES OF ARTERIOVENOUS FISTULA Left 01/25/2018   Procedure: LIGATION OF COMPETING BRANCHES And Revision of ARTERIOVENOUS FISTULA LEFT ARM.;  Surgeon: Elam Dutch, MD;  Location: [place];  Service: Vascular;  Laterality: Left;   THYROIDECTOMY  01/22/2017   THYROIDECTOMY Left 01/22/2017   Procedure: THYROIDECTOMY;  Surgeon: Melida Quitter, MD;  Location: [place];  Service: ENT;  Laterality: Left;  THYROIDECTOMY Right 03/26/2017   Procedure: COMPLETION OF THYROIDECTOMY;  Surgeon: Melida Quitter, MD;  Location: [place];  Service: ENT;  Laterality: Right;   UMBILICAL HERNIA REPAIR     WISDOM TOOTH EXTRACTION      reports that he quit smoking about 2 weeks ago. His smoking use included cigarettes. He has never used smokeless tobacco. He reports that he does not currently use alcohol. He reports that he does not use drugs. family history includes Diabetes in his mother; Healthy in his maternal grandfather, paternal grandfather, and paternal grandmother; Hypertension in his maternal grandmother; Kidney disease in his maternal grandmother; Pneumonia in his father; Stroke in his mother. No Known Allergies Current Outpatient Medications on File Prior to Visit  Medication Sig Dispense Refill   acetaminophen (TYLENOL)  500 MG tablet Take 1,000 mg by mouth every 6 (six) hours as needed for moderate pain.     albuterol (VENTOLIN HFA) 108 (90 Base) MCG/ACT inhaler INHALE 2 PUFFS INTO THE LUNGS EVERY 6 HOURS AS NEEDED FOR WHEEZING OR SHORTNESS OF BREATH (Patient taking differently: Inhale 2 puffs into the lungs every 6 (six) hours as needed for wheezing or shortness of breath.) 18 g 5   aspirin EC 81 MG tablet Take 81 mg by mouth daily.     carvedilol (COREG) 25 MG tablet TAKE 1 TABLET(25 MG) BY MOUTH TWICE DAILY 180 tablet 2   fenofibrate 54 MG tablet TAKE 1 TABLET BY MOUTH EVERY DAY 60 tablet 0   furosemide (LASIX) 20 MG tablet Take 20 mg by mouth 2 (two) times daily.     iron sucrose in sodium chloride 0.9 % 100 mL Iron Sucrose (Venofer)     isosorbide mononitrate (IMDUR) 30 MG 24 hr tablet TAKE 2 TABLETS BY MOUTH DAILY. SCHEDULE OFFICE VISIT FOR FUTURE REFILLS 180 tablet 3   ketoconazole (NIZORAL) 2 % cream Apply 1 application topically daily. 60 g 2   latanoprost (XALATAN) 0.005 % ophthalmic solution Place 1 drop into the left eye at bedtime.     lidocaine-prilocaine (EMLA) cream Apply 1 application topically 3 (three) times a week. MWF     nitroGLYCERIN (NITROSTAT) 0.4 MG SL tablet DISSOLVE 1 TABLET UNDER THE TONGUE EVERY 5 MINUTES AS NEEDED FOR CHEST PAIN (Patient taking differently: Place 0.4 mg under the tongue every 5 (five) minutes as needed for chest pain.) 25 tablet 3   oxyCODONE-acetaminophen (PERCOCET/ROXICET) 5-325 MG tablet Take 1 tablet by mouth every 6 (six) hours as needed for severe pain. 15 tablet 0   Potassium Chloride ER 20 MEQ TBCR Take 1 tablet by mouth daily.     vitamin B-12 (CYANOCOBALAMIN) 1000 MCG tablet Take 1,000 mcg by mouth daily.     iron polysaccharides (NIFEREX) 150 MG capsule Take 1 capsule (150 mg total) by mouth daily. (Patient not taking: Reported on 05/07/2021) 30 capsule 0   pantoprazole (PROTONIX) 40 MG tablet Take 1 tablet (40 mg total) by mouth 2 (two) times daily before a  meal. (Patient not taking: Reported on 05/13/2021) 180 tablet 3   thiamine (VITAMIN B-1) 50 MG tablet Take 1 tablet (50 mg total) by mouth daily. (Patient not taking: Reported on 05/07/2021) 90 tablet 1   tiZANidine (ZANAFLEX) 2 MG tablet TAKE 1 TABLET(2 MG) BY MOUTH DAILY (Patient not taking: Reported on 05/07/2021) 30 tablet 0   No current facility-administered medications on file prior to visit.        ROS:  All others reviewed and negative.  Objective  PE:  BP 110/80 (BP Location: Left Arm, Patient Position: Sitting, Cuff Size: Large)    Pulse 69    Temp 98.2 F (36.8 C) (Oral)    Ht 6\' 2"  (1.88 m)    Wt 252 lb (114.3 kg)    SpO2 99%    BMI 32.35 kg/m                 Constitutional: Pt appears in NAD               HENT: Head: NCAT.                Right Ear: External ear normal.  Right ear canal normal appearinace and TM normal color without effusion                Left Ear: External ear normal.                Eyes: . Pupils are equal, round, and reactive to light. Conjunctivae and EOM are normal               Nose: without d/c or deformity               Neck: Neck supple. Gross normal ROM               Cardiovascular: Normal rate and regular rhythm.                 Pulmonary/Chest: Effort normal and breath sounds without rales or wheezing.                Abd:  Soft, NT, ND, + BS, no organomegaly               Neurological: Pt is alert. At baseline orientation, motor grossly intact               Skin: Skin is warm. No rashes, no other new lesions, LE edema - none               Psychiatric: Pt behavior is normal without agitation   Micro: none  Cardiac tracings I have personally interpreted today:  none  Pertinent Radiological findings (summarize): none   Lab Results  Component Value Date   WBC 5.1 05/09/2021   HGB 11.2 (L) 05/09/2021   HCT 35.8 (L) 05/09/2021   PLT 125 (L) 05/09/2021   GLUCOSE 92 05/09/2021   CHOL 132 06/07/2020   TRIG 162 (H) 08/21/2020   HDL 30.40  (L) 06/07/2020   LDLCALC 72 06/07/2020   ALT 9 05/09/2021   AST 10 (L) 05/09/2021   NA 136 05/09/2021   K 3.8 05/09/2021   CL 99 05/09/2021   CREATININE 11.41 (H) 05/09/2021   BUN 29 (H) 05/09/2021   CO2 23 05/09/2021   TSH 0.50 06/07/2020   PSA 0.67 06/07/2020   INR 1.1 05/09/2021   HGBA1C 5.5 06/07/2020   Assessment/Plan:  Craig Flynn is a 61 y.o. Black or African American [2] male with  has a past medical history of Anemia (06/2015), Arthritis, Cancer ([place]), Cardiomyopathy ([employer]), Chest pain (06/2015), CHF (congestive heart failure) ([place]), CKD (chronic kidney disease), stage IV ([place]), Dyspnea, GERD (gastroesophageal reflux disease), Glaucoma, Gout, Hypertension, Obesity, Pancreatitis, Sleep apnea, Tuberculosis (1981), and Tubular adenoma of colon (10/2015).  Retinal artery thrombosis, right Brain MRI rescheduled to mar 9, symptoms resolved per pt, cont current med tx  Pulsatile tinnitus of right ear New onset post visual symptoms uncontrolled, etiology unclear, exam  benign and non toxic, no new other new neuro findings; fortunately has the MRI scheduled soon, will continue to follow, consider ENT referral  Hyperglycemia Lab Results  Component Value Date   HGBA1C 5.5 06/07/2020   Stable, pt to continue current medical treatment  - diet   HTN (hypertension) BP Readings from Last 3 Encounters:  05/23/21 110/80  05/13/21 128/60  05/09/21 129/60   Stable, pt to continue medical treatment  coreg  Followup: Return if symptoms worsen or fail to improve.  Cathlean Cower, MD 05/25/2021 7:59 AM [facility]

## 2021-05-23 NOTE — Patient Instructions (Signed)
Please take all new medication as prescribed - the valium 5 mg about 45 min prior to the MRI ? ?Please continue all other medications as before, and refills have been done if requested. ? ?Please have the pharmacy call with any other refills you may need. ? ?Please keep your appointments with your specialists as you may have planned ? ? ? ?

## 2021-05-25 ENCOUNTER — Encounter: Payer: Self-pay | Admitting: Internal Medicine

## 2021-05-25 DIAGNOSIS — H93A1 Pulsatile tinnitus, right ear: Secondary | ICD-10-CM | POA: Insufficient documentation

## 2021-05-25 NOTE — Assessment & Plan Note (Signed)
New onset post visual symptoms uncontrolled, etiology unclear, exam benign and non toxic, no new other new neuro findings; fortunately has the MRI scheduled soon, will continue to follow, consider ENT referral ?

## 2021-05-25 NOTE — Assessment & Plan Note (Signed)
Brain MRI rescheduled to mar 9, symptoms resolved per pt, cont current med tx ?

## 2021-05-25 NOTE — Assessment & Plan Note (Signed)
Lab Results  ?Component Value Date  ? HGBA1C 5.5 06/07/2020  ? ?Stable, pt to continue current medical treatment  - diet ? ?

## 2021-05-25 NOTE — Assessment & Plan Note (Signed)
BP Readings from Last 3 Encounters:  ?05/23/21 110/80  ?05/13/21 128/60  ?05/09/21 129/60  ? ?Stable, pt to continue medical treatment  coreg ? ?

## 2021-05-26 DIAGNOSIS — N2581 Secondary hyperparathyroidism of renal origin: Secondary | ICD-10-CM | POA: Diagnosis not present

## 2021-05-26 DIAGNOSIS — Z992 Dependence on renal dialysis: Secondary | ICD-10-CM | POA: Diagnosis not present

## 2021-05-26 DIAGNOSIS — N186 End stage renal disease: Secondary | ICD-10-CM | POA: Diagnosis not present

## 2021-05-28 DIAGNOSIS — N186 End stage renal disease: Secondary | ICD-10-CM | POA: Diagnosis not present

## 2021-05-28 DIAGNOSIS — Z992 Dependence on renal dialysis: Secondary | ICD-10-CM | POA: Diagnosis not present

## 2021-05-28 DIAGNOSIS — N2581 Secondary hyperparathyroidism of renal origin: Secondary | ICD-10-CM | POA: Diagnosis not present

## 2021-05-29 ENCOUNTER — Other Ambulatory Visit: Payer: Medicare HMO

## 2021-05-30 DIAGNOSIS — N2581 Secondary hyperparathyroidism of renal origin: Secondary | ICD-10-CM | POA: Diagnosis not present

## 2021-05-30 DIAGNOSIS — Z992 Dependence on renal dialysis: Secondary | ICD-10-CM | POA: Diagnosis not present

## 2021-05-30 DIAGNOSIS — N186 End stage renal disease: Secondary | ICD-10-CM | POA: Diagnosis not present

## 2021-06-02 DIAGNOSIS — N2581 Secondary hyperparathyroidism of renal origin: Secondary | ICD-10-CM | POA: Diagnosis not present

## 2021-06-02 DIAGNOSIS — Z992 Dependence on renal dialysis: Secondary | ICD-10-CM | POA: Diagnosis not present

## 2021-06-02 DIAGNOSIS — N186 End stage renal disease: Secondary | ICD-10-CM | POA: Diagnosis not present

## 2021-06-04 DIAGNOSIS — N186 End stage renal disease: Secondary | ICD-10-CM | POA: Diagnosis not present

## 2021-06-04 DIAGNOSIS — Z992 Dependence on renal dialysis: Secondary | ICD-10-CM | POA: Diagnosis not present

## 2021-06-04 DIAGNOSIS — N2581 Secondary hyperparathyroidism of renal origin: Secondary | ICD-10-CM | POA: Diagnosis not present

## 2021-06-05 ENCOUNTER — Encounter (INDEPENDENT_AMBULATORY_CARE_PROVIDER_SITE_OTHER): Payer: Medicare HMO | Admitting: Ophthalmology

## 2021-06-05 ENCOUNTER — Encounter (INDEPENDENT_AMBULATORY_CARE_PROVIDER_SITE_OTHER): Payer: Self-pay

## 2021-06-06 DIAGNOSIS — Z992 Dependence on renal dialysis: Secondary | ICD-10-CM | POA: Diagnosis not present

## 2021-06-06 DIAGNOSIS — N2581 Secondary hyperparathyroidism of renal origin: Secondary | ICD-10-CM | POA: Diagnosis not present

## 2021-06-06 DIAGNOSIS — N186 End stage renal disease: Secondary | ICD-10-CM | POA: Diagnosis not present

## 2021-06-09 DIAGNOSIS — N2581 Secondary hyperparathyroidism of renal origin: Secondary | ICD-10-CM | POA: Diagnosis not present

## 2021-06-09 DIAGNOSIS — N186 End stage renal disease: Secondary | ICD-10-CM | POA: Diagnosis not present

## 2021-06-09 DIAGNOSIS — Z992 Dependence on renal dialysis: Secondary | ICD-10-CM | POA: Diagnosis not present

## 2021-06-12 ENCOUNTER — Ambulatory Visit (HOSPITAL_COMMUNITY): Payer: Medicare HMO

## 2021-06-12 ENCOUNTER — Ambulatory Visit: Payer: Medicare HMO | Admitting: Internal Medicine

## 2021-06-13 DIAGNOSIS — N186 End stage renal disease: Secondary | ICD-10-CM | POA: Diagnosis not present

## 2021-06-13 DIAGNOSIS — N2581 Secondary hyperparathyroidism of renal origin: Secondary | ICD-10-CM | POA: Diagnosis not present

## 2021-06-13 DIAGNOSIS — Z992 Dependence on renal dialysis: Secondary | ICD-10-CM | POA: Diagnosis not present

## 2021-06-14 ENCOUNTER — Emergency Department (HOSPITAL_COMMUNITY): Payer: Medicare HMO

## 2021-06-14 ENCOUNTER — Emergency Department (HOSPITAL_COMMUNITY)
Admission: EM | Admit: 2021-06-14 | Discharge: 2021-06-14 | Disposition: A | Discharge status: Home / Self Care | Payer: Medicare HMO | Attending: Emergency Medicine | Admitting: Emergency Medicine

## 2021-06-14 ENCOUNTER — Encounter (HOSPITAL_COMMUNITY): Payer: Self-pay | Admitting: Emergency Medicine

## 2021-06-14 ENCOUNTER — Other Ambulatory Visit: Payer: Self-pay

## 2021-06-14 DIAGNOSIS — R55 Syncope and collapse: Secondary | ICD-10-CM | POA: Insufficient documentation

## 2021-06-14 DIAGNOSIS — N186 End stage renal disease: Secondary | ICD-10-CM | POA: Insufficient documentation

## 2021-06-14 DIAGNOSIS — R519 Headache, unspecified: Secondary | ICD-10-CM | POA: Diagnosis not present

## 2021-06-14 DIAGNOSIS — R079 Chest pain, unspecified: Secondary | ICD-10-CM | POA: Diagnosis not present

## 2021-06-14 DIAGNOSIS — Z79899 Other long term (current) drug therapy: Secondary | ICD-10-CM | POA: Diagnosis not present

## 2021-06-14 DIAGNOSIS — R0602 Shortness of breath: Secondary | ICD-10-CM | POA: Insufficient documentation

## 2021-06-14 DIAGNOSIS — H538 Other visual disturbances: Secondary | ICD-10-CM | POA: Diagnosis not present

## 2021-06-14 DIAGNOSIS — R42 Dizziness and giddiness: Secondary | ICD-10-CM | POA: Diagnosis not present

## 2021-06-14 DIAGNOSIS — Z7982 Long term (current) use of aspirin: Secondary | ICD-10-CM | POA: Insufficient documentation

## 2021-06-14 LAB — CBC WITH DIFFERENTIAL/PLATELET
Abs Immature Granulocytes: 0.01 10*3/uL (ref 0.00–0.07)
Basophils Absolute: 0 10*3/uL (ref 0.0–0.1)
Basophils Relative: 1 %
Eosinophils Absolute: 0.2 10*3/uL (ref 0.0–0.5)
Eosinophils Relative: 4 %
HCT: 33.6 % — ABNORMAL LOW (ref 39.0–52.0)
Hemoglobin: 10.5 g/dL — ABNORMAL LOW (ref 13.0–17.0)
Immature Granulocytes: 0 %
Lymphocytes Relative: 38 %
Lymphs Abs: 1.6 10*3/uL (ref 0.7–4.0)
MCH: 28.5 pg (ref 26.0–34.0)
MCHC: 31.3 g/dL (ref 30.0–36.0)
MCV: 91.1 fL (ref 80.0–100.0)
Monocytes Absolute: 0.4 10*3/uL (ref 0.1–1.0)
Monocytes Relative: 10 %
Neutro Abs: 2 10*3/uL (ref 1.7–7.7)
Neutrophils Relative %: 47 %
Platelets: 147 10*3/uL — ABNORMAL LOW (ref 150–400)
RBC: 3.69 MIL/uL — ABNORMAL LOW (ref 4.22–5.81)
RDW: 15.5 % (ref 11.5–15.5)
WBC: 4.1 10*3/uL (ref 4.0–10.5)
nRBC: 0 % (ref 0.0–0.2)

## 2021-06-14 LAB — BASIC METABOLIC PANEL
Anion gap: 11 (ref 5–15)
BUN: 27 mg/dL — ABNORMAL HIGH (ref 6–20)
CO2: 28 mmol/L (ref 22–32)
Calcium: 8.9 mg/dL (ref 8.9–10.3)
Chloride: 100 mmol/L (ref 98–111)
Creatinine, Ser: 10.72 mg/dL — ABNORMAL HIGH (ref 0.61–1.24)
GFR, Estimated: 5 mL/min — ABNORMAL LOW (ref >60)
Glucose, Bld: 72 mg/dL (ref 70–99)
Potassium: 4.4 mmol/L (ref 3.5–5.1)
Sodium: 139 mmol/L (ref 135–145)

## 2021-06-14 LAB — TROPONIN I (HIGH SENSITIVITY)
Troponin I (High Sensitivity): 14 ng/L (ref <18)
Troponin I (High Sensitivity): 13 ng/L (ref <18)

## 2021-06-14 MED ORDER — METOCLOPRAMIDE HCL 5 MG/ML IJ SOLN
10.0000 mg | Freq: Once | INTRAMUSCULAR | Status: AC
  Administered 2021-06-14: 10 mg via INTRAVENOUS
  Filled 2021-06-14: qty 2

## 2021-06-14 MED ORDER — DIPHENHYDRAMINE HCL 50 MG/ML IJ SOLN
25.0000 mg | Freq: Once | INTRAMUSCULAR | Status: AC
Start: 2021-06-14 — End: 2021-06-14
  Administered 2021-06-14: 25 mg via INTRAVENOUS
  Filled 2021-06-14: qty 1

## 2021-06-14 MED ORDER — SODIUM CHLORIDE 0.9 % IV BOLUS
500.0000 mL | Freq: Once | INTRAVENOUS | Status: AC
  Administered 2021-06-14: 500 mL via INTRAVENOUS

## 2021-06-14 MED ORDER — SODIUM CHLORIDE 0.9 % IV BOLUS: 1000.0000 mL | Freq: Once | INTRAVENOUS | Status: DC

## 2021-06-14 NOTE — Discharge Instructions (Signed)
If you develop continued, recurrent, or worsening headache, fever, neck stiffness, vomiting, blurry or double vision, weakness or numbness in your arms or legs, trouble speaking, or any other new/concerning symptoms then return to the ER for evaluation.  

## 2021-06-14 NOTE — ED Triage Notes (Signed)
Pt c/o headache and vision changes x 1.77mo, and dizziness that started this morning. Denies n/v. Hx HTN. A&Ox4, no other neuro deficits noted at this time. ?

## 2021-06-14 NOTE — ED Provider Notes (Signed)
?[place] ?Provider Note ? ? ?CSN: [id number] ?Arrival date & time: 06/14/21  1802 ? ?  ? ?History ? ?Chief Complaint  ?Patient presents with  ? Dizziness  ? ? ?Craig Flynn is a 61 y.o. male. ? ?HPI ?61 year old male presents with headache and dizziness/near syncope.  He has been having headaches for over a month.  Every day, worst at night.  It is right-sided and occipital parietal.  Headache is at times severe 10 out of 10.  Usually he takes Tylenol that will bring it down a little bit.  He has been having blurry vision out of both eyes the whole time he had the headache.  He was here about a month ago and states he had a benign head CT and was told he needed an MRI but he could not afford it.  No focal weakness or numbness.  Continues to have headache that is getting worse and dizzy episodes.  Today he had a dizzy episode and felt like he was going to fall over.  When asked about chest pain he states he has had on and off chest pain for a long time as well as some shortness of breath.  This included today very briefly.  Right now headache is about an 8 out of 10.  This dizzy episode occurred around 4 PM. ? ?Home Medications ?Prior to Admission medications   ?Medication Sig Start Date End Date Taking? Authorizing Provider  ?acetaminophen (TYLENOL) 500 MG tablet Take 1,000 mg by mouth every 6 (six) hours as needed for moderate pain.    [provider]  ?albuterol (VENTOLIN HFA) 108 (90 Base) MCG/ACT inhaler INHALE 2 PUFFS INTO THE LUNGS EVERY 6 HOURS AS NEEDED FOR WHEEZING OR SHORTNESS OF BREATH ?Patient taking differently: Inhale 2 puffs into the lungs every 6 (six) hours as needed for wheezing or shortness of breath. 08/01/20   Biagio Borg, MD  ?aspirin EC 81 MG tablet Take 81 mg by mouth daily.    [provider]  ?carvedilol (COREG) 25 MG tablet TAKE 1 TABLET(25 MG) BY MOUTH TWICE DAILY 12/05/20   Biagio Borg, MD  ?diazepam (VALIUM) 5 MG tablet 1 tab  by mouth 45 min prior to MRI 05/23/21   Biagio Borg, MD  ?fenofibrate 54 MG tablet TAKE 1 TABLET BY MOUTH EVERY DAY 08/07/20   Minus Breeding, MD  ?furosemide (LASIX) 20 MG tablet Take 20 mg by mouth 2 (two) times daily.    [provider]  ?iron polysaccharides (NIFEREX) 150 MG capsule Take 1 capsule (150 mg total) by mouth daily. ?Patient not taking: Reported on 05/07/2021 08/28/20   Patrecia Pour, MD  ?iron sucrose in sodium chloride 0.9 % 100 mL Iron Sucrose (Venofer) 09/25/20 09/17/21  [provider]  ?isosorbide mononitrate (IMDUR) 30 MG 24 hr tablet TAKE 2 TABLETS BY MOUTH DAILY. SCHEDULE OFFICE VISIT FOR FUTURE REFILLS 02/05/21   Minus Breeding, MD  ?ketoconazole (NIZORAL) 2 % cream Apply 1 application topically daily. 02/11/21   Lorenda Peck, MD  ?latanoprost (XALATAN) 0.005 % ophthalmic solution Place 1 drop into the left eye at bedtime. 05/08/21   [provider]  ?lidocaine-prilocaine (EMLA) cream Apply 1 application topically 3 (three) times a week. MWF 11/14/20   [provider]  ?nitroGLYCERIN (NITROSTAT) 0.4 MG SL tablet DISSOLVE 1 TABLET UNDER THE TONGUE EVERY 5 MINUTES AS NEEDED FOR CHEST PAIN ?Patient taking differently: Place 0.4 mg under the tongue every 5 (five)  minutes as needed for chest pain. 03/29/19   Tami Lin, MD  ?oxyCODONE-acetaminophen (PERCOCET/ROXICET) 5-325 MG tablet Take 1 tablet by mouth every 6 (six) hours as needed for severe pain. 05/09/21   Loeffler, Adora Fridge, PA-C  ?pantoprazole (PROTONIX) 40 MG tablet Take 1 tablet (40 mg total) by mouth 2 (two) times daily before a meal. ?Patient not taking: Reported on 05/13/2021 01/17/21   Biagio Borg, MD  ?Potassium Chloride ER 20 MEQ TBCR Take 1 tablet by mouth daily. 11/26/20   [provider]  ?thiamine (VITAMIN B-1) 50 MG tablet Take 1 tablet (50 mg total) by mouth daily. ?Patient not taking: Reported on 05/07/2021 03/25/20   Janith Lima, MD  ?tiZANidine (ZANAFLEX) 2 MG tablet TAKE  1 TABLET(2 MG) BY MOUTH DAILY ?Patient not taking: Reported on 05/07/2021 04/10/21   Maximiano Coss, NP  ?vitamin B-12 (CYANOCOBALAMIN) 1000 MCG tablet Take 1,000 mcg by mouth daily.    [provider]  ?   ? ?Allergies    ?Patient has no known allergies.   ? ?Review of Systems   ?Review of Systems  ?Constitutional:  Negative for fever.  ?Eyes:  Positive for visual disturbance.  ?Respiratory:  Positive for shortness of breath.   ?Cardiovascular:  Positive for chest pain.  ?Gastrointestinal:  Negative for vomiting.  ?Neurological:  Positive for dizziness and headaches. Negative for weakness.  ? ?Physical Exam ?Updated Vital Signs ?BP 121/63 (BP Location: Right Arm)   Pulse 67   Temp 98.8 ?F (37.1 ?C) (Oral)   Resp 20   SpO2 100%  ?Physical Exam ?Vitals and nursing note reviewed.  ?Constitutional:   ?   Appearance: He is well-developed.  ?HENT:  ?   Head: Normocephalic and atraumatic.  ?Eyes:  ?   Extraocular Movements: Extraocular movements intact.  ?Cardiovascular:  ?   Rate and Rhythm: Normal rate and regular rhythm.  ?   Heart sounds: Normal heart sounds.  ?Pulmonary:  ?   Effort: Pulmonary effort is normal.  ?   Breath sounds: Normal breath sounds.  ?Abdominal:  ?   Palpations: Abdomen is soft.  ?   Tenderness: There is no abdominal tenderness.  ?Skin: ?   General: Skin is warm and dry.  ?Neurological:  ?   Mental Status: He is alert.  ?   Comments: CN 3-12 grossly intact. 5/5 strength in all 4 extremities. Grossly normal sensation. Normal finger to nose.   ? ? ?ED Results / Procedures / Treatments   ?Labs ?(all labs ordered are listed, but only abnormal results are displayed) ?Labs Reviewed  ?BASIC METABOLIC PANEL - Abnormal; Notable for the following components:  ?    Result Value  ? BUN 27 (*)   ? Creatinine, Ser 10.72 (*)   ? GFR, Estimated 5 (*)   ? All other components within normal limits  ?CBC WITH DIFFERENTIAL/PLATELET - Abnormal; Notable for the following components:  ? RBC 3.69 (*)   ?  Hemoglobin 10.5 (*)   ? HCT 33.6 (*)   ? Platelets 147 (*)   ? All other components within normal limits  ?TROPONIN I (HIGH SENSITIVITY)  ?TROPONIN I (HIGH SENSITIVITY)  ? ? ?EKG ?EKG Interpretation ? ?Date/Time:  Saturday June 14 2021 19:20:21 EDT ?Ventricular Rate:  69 ?PR Interval:  202 ?QRS Duration: 84 ?QT Interval:  410 ?QTC Calculation: 439 ?R Axis:   55 ?Text Interpretation: Normal sinus rhythm  similar to earlier in the day Confirmed by Sherwood Gambler [phone]) on  06/14/2021 8:30:10 PM ? ?Radiology ?DG Chest 2 View ? ?Result Date: 06/14/2021 ?CLINICAL DATA:  Chest pain and dizziness EXAM: CHEST - 2 VIEW COMPARISON:  Chest radiograph dated 12/01/2020. FINDINGS: The heart size is normal. Vascular calcifications are seen in the aortic arch. Mild peribronchial thickening appears similar to prior exam. No pleural effusion or pneumothorax. Degenerative changes are seen in the spine. IMPRESSION: Mild peribronchial thickening may represent pulmonary edema or bronchitis. Aortic Atherosclerosis (ICD10-I70.0). Electronically Signed   By: Zerita Boers M.D.   On: 06/14/2021 19:36  ? ?CT Head Wo Contrast ? ?Result Date: 06/14/2021 ?CLINICAL DATA:  Headache and visual changes for 1.5 months, dizziness EXAM: CT HEAD WITHOUT CONTRAST TECHNIQUE: Contiguous axial images were obtained from the base of the skull through the vertex without intravenous contrast. RADIATION DOSE REDUCTION: This exam was performed according to the departmental dose-optimization program which includes automated exposure control, adjustment of the mA and/or kV according to patient size and/or use of iterative reconstruction technique. COMPARISON:  05/09/2021 FINDINGS: Brain: No acute infarct or hemorrhage. Scattered hypodensities throughout the periventricular and subcortical white matter are unchanged, consistent with chronic small vessel ischemic changes. Lateral ventricles and remaining midline structures are unremarkable. No acute extra-axial fluid  collections. No mass effect. Vascular: Stable atherosclerosis.  No hyperdense vessel. Skull: Normal. Negative for fracture or focal lesion. Sinuses/Orbits: No acute finding. Other: None. IMPRESSION: 1. Stabl

## 2021-06-16 DIAGNOSIS — Z992 Dependence on renal dialysis: Secondary | ICD-10-CM | POA: Diagnosis not present

## 2021-06-16 DIAGNOSIS — N186 End stage renal disease: Secondary | ICD-10-CM | POA: Diagnosis not present

## 2021-06-16 DIAGNOSIS — N2581 Secondary hyperparathyroidism of renal origin: Secondary | ICD-10-CM | POA: Diagnosis not present

## 2021-06-17 ENCOUNTER — Telehealth: Payer: Self-pay | Admitting: *Deleted

## 2021-06-17 ENCOUNTER — Telehealth: Payer: Medicare HMO

## 2021-06-17 ENCOUNTER — Ambulatory Visit (HOSPITAL_COMMUNITY): Admission: RE | Admit: 2021-06-17 | Payer: Medicare HMO | Source: Ambulatory Visit

## 2021-06-17 ENCOUNTER — Encounter: Payer: Self-pay | Admitting: *Deleted

## 2021-06-17 NOTE — Telephone Encounter (Signed)
?  Chronic Care Management  ? ?Follow Up Note ? ? ?06/17/2021 ?Name: Craig Flynn MRN: [id number] DOB: 1961/01/11 ? ?Referred by: Biagio Borg, MD ?Reason for referral : Chronic Care Management (CCM RN CM Telephone Visit; Unsuccessful Outreach attempt) ? ?An unsuccessful telephone outreach was attempted today. The patient was referred to the case management team for assistance with care management and care coordination.  ? ?Follow Up Plan:  ?A HIPPA compliant phone message was left for the patient providing contact information and requesting a return call ?Will place request with scheduling care guide to contact patient to re-schedule today's missed CCM RN follow up telephone appointment if I do not hear back from patient by end of day ? ?Oneta Rack, RN, BSN, CCRN Alumnus ?Craig Flynn ?([phone]: direct office ? ? ?

## 2021-06-18 DIAGNOSIS — Z992 Dependence on renal dialysis: Secondary | ICD-10-CM | POA: Diagnosis not present

## 2021-06-18 DIAGNOSIS — N2581 Secondary hyperparathyroidism of renal origin: Secondary | ICD-10-CM | POA: Diagnosis not present

## 2021-06-18 DIAGNOSIS — N186 End stage renal disease: Secondary | ICD-10-CM | POA: Diagnosis not present

## 2021-06-19 ENCOUNTER — Encounter: Payer: Self-pay | Admitting: Internal Medicine

## 2021-06-19 ENCOUNTER — Ambulatory Visit (INDEPENDENT_AMBULATORY_CARE_PROVIDER_SITE_OTHER): Payer: Medicare HMO | Admitting: Internal Medicine

## 2021-06-19 VITALS — BP 110/70 | HR 64 | Temp 98.9°F | Ht 74.0 in | Wt 257.0 lb

## 2021-06-19 DIAGNOSIS — R739 Hyperglycemia, unspecified: Secondary | ICD-10-CM

## 2021-06-19 DIAGNOSIS — R42 Dizziness and giddiness: Secondary | ICD-10-CM

## 2021-06-19 DIAGNOSIS — I10 Essential (primary) hypertension: Secondary | ICD-10-CM | POA: Diagnosis not present

## 2021-06-19 NOTE — Progress Notes (Signed)
Patient ID: Craig Flynn, male   DOB: 18-Sep-1960, 61 y.o.   MRN: [id number] ? ? ? ?    Chief Complaint: follow up dizzy lightheaded, htn, hyperglycemia ? ?     HPI:  Craig Flynn is a 61 y.o. male here with c/o persistent dizzy lightheaded mostly mild persistent constant , worse to stand, better to sit, Pt denies chest pain, increased sob or doe, wheezing, orthopnea, PND, increased LE swelling, palpitations, or syncope, and appears to not be related or worse with dialysis days.  Was seen at ED 3/25 with neg cT head and labs.  No falls.   Pt denies polydipsia, polyuria, or new focal neuro s/s.    Pt denies fever, wt loss, night sweats, loss of appetite, or other constitutional symptoms   ?      ?Wt Readings from Last 3 Encounters:  ?06/19/21 257 lb (116.6 kg)  ?05/23/21 252 lb (114.3 kg)  ?05/13/21 252 lb (114.3 kg)  ? ?BP Readings from Last 3 Encounters:  ?06/19/21 110/70  ?06/14/21 121/63  ?05/23/21 110/80  ? ?      ?Past Medical History:  ?Diagnosis Date  ? Anemia 06/2015  ? Arthritis   ? knee  ? Cancer [facility])   ? thyroid cancer  ? Cardiomyopathy ([place])   ? a. 10/2013: EF reduced to 35-40% b. 09/2014: EF improved to 55-60%, Grade 1 DD noted.  ? Chest pain 06/2015  ? CHF (congestive heart failure) ([place])   ? CKD (chronic kidney disease), stage IV ([place])   ? sees  Dr Lorrene Reid  ? Dyspnea   ? with exerion   ? GERD (gastroesophageal reflux disease)   ? Glaucoma   ? Gout   ? Hypertension   ? Obesity   ? Pancreatitis   ? Sleep apnea   ? not wearing c-pap now, needs new slep study per pt. (01/22/2017)  ? Tuberculosis 1981  ? while the pt was in the service.  ? Tubular adenoma of colon 10/2015  ? ?Past Surgical History:  ?Procedure Laterality Date  ? AV FISTULA PLACEMENT Left 03/25/2016  ? Procedure: Creation of Left Arm ARTERIOVENOUS (AV) FISTULA;  Surgeon: Elam Dutch, MD;  Location: [place];  Service: Vascular;  Laterality: Left;  ? AV FISTULA PLACEMENT Left 08/27/2020  ? Procedure: LEFT BASILIC VEIN ARTERIOVENOUS (AV) FISTULA  CREATION;  Surgeon: Rosetta Posner, MD;  Location: [place];  Service: Vascular;  Laterality: Left;  ? BASCILIC VEIN TRANSPOSITION Left 10/22/2020  ? Procedure: LEFT SECOND STAGE BASILIC VEIN TRANSPOSITION;  Surgeon: Elam Dutch, MD;  Location: [facility];  Service: Vascular;  Laterality: Left;  ? COLONOSCOPY W/ BIOPSIES AND POLYPECTOMY    ? "no problem"  ? HERNIA REPAIR    ? IR FLUORO GUIDE CV LINE RIGHT  08/22/2020  ? IR US GUIDE VASC ACCESS RIGHT  08/22/2020  ? LAPAROSCOPIC CHOLECYSTECTOMY    ? LIGATION OF COMPETING BRANCHES OF ARTERIOVENOUS FISTULA Left 01/25/2018  ? Procedure: LIGATION OF COMPETING BRANCHES And Revision of ARTERIOVENOUS FISTULA LEFT ARM.;  Surgeon: Elam Dutch, MD;  Location: [place];  Service: Vascular;  Laterality: Left;  ? THYROIDECTOMY  01/22/2017  ? THYROIDECTOMY Left 01/22/2017  ? Procedure: THYROIDECTOMY;  Surgeon: Melida Quitter, MD;  Location: [place];  Service: ENT;  Laterality: Left;  ? THYROIDECTOMY Right 03/26/2017  ? Procedure: COMPLETION OF THYROIDECTOMY;  Surgeon: Melida Quitter, MD;  Location: [place];  Service: ENT;  Laterality: Right;  ? UMBILICAL HERNIA REPAIR    ?  WISDOM TOOTH EXTRACTION    ? ? reports that he quit smoking about 6 weeks ago. His smoking use included cigarettes. He has never used smokeless tobacco. He reports that he does not currently use alcohol. He reports that he does not use drugs. ?family history includes Diabetes in his mother; Healthy in his maternal grandfather, paternal grandfather, and paternal grandmother; Hypertension in his maternal grandmother; Kidney disease in his maternal grandmother; Pneumonia in his father; Stroke in his mother. ?No Known Allergies ?Current Outpatient Medications on File Prior to Visit  ?Medication Sig Dispense Refill  ? acetaminophen (TYLENOL) 500 MG tablet Take 1,000 mg by mouth every 6 (six) hours as needed for moderate pain.    ? albuterol (VENTOLIN HFA) 108 (90 Base) MCG/ACT inhaler INHALE 2 PUFFS INTO THE LUNGS EVERY 6 HOURS AS  NEEDED FOR WHEEZING OR SHORTNESS OF BREATH (Patient taking differently: Inhale 2 puffs into the lungs every 6 (six) hours as needed for wheezing or shortness of breath.) 18 g 5  ? aspirin EC 81 MG tablet Take 81 mg by mouth daily.    ? carvedilol (COREG) 25 MG tablet TAKE 1 TABLET(25 MG) BY MOUTH TWICE DAILY 180 tablet 2  ? diazepam (VALIUM) 5 MG tablet 1 tab by mouth 45 min prior to MRI 1 tablet 0  ? fenofibrate 54 MG tablet TAKE 1 TABLET BY MOUTH EVERY DAY 60 tablet 0  ? iron sucrose in sodium chloride 0.9 % 100 mL Iron Sucrose (Venofer)    ? isosorbide mononitrate (IMDUR) 30 MG 24 hr tablet TAKE 2 TABLETS BY MOUTH DAILY. SCHEDULE OFFICE VISIT FOR FUTURE REFILLS 180 tablet 3  ? ketoconazole (NIZORAL) 2 % cream Apply 1 application topically daily. 60 g 2  ? latanoprost (XALATAN) 0.005 % ophthalmic solution Place 1 drop into the left eye at bedtime.    ? lidocaine-prilocaine (EMLA) cream Apply 1 application topically 3 (three) times a week. MWF    ? nitroGLYCERIN (NITROSTAT) 0.4 MG SL tablet DISSOLVE 1 TABLET UNDER THE TONGUE EVERY 5 MINUTES AS NEEDED FOR CHEST PAIN (Patient taking differently: Place 0.4 mg under the tongue every 5 (five) minutes as needed for chest pain.) 25 tablet 3  ? oxyCODONE-acetaminophen (PERCOCET/ROXICET) 5-325 MG tablet Take 1 tablet by mouth every 6 (six) hours as needed for severe pain. 15 tablet 0  ? Potassium Chloride ER 20 MEQ TBCR Take 1 tablet by mouth daily.    ? vitamin B-12 (CYANOCOBALAMIN) 1000 MCG tablet Take 1,000 mcg by mouth daily.    ? iron polysaccharides (NIFEREX) 150 MG capsule Take 1 capsule (150 mg total) by mouth daily. (Patient not taking: Reported on 05/07/2021) 30 capsule 0  ? pantoprazole (PROTONIX) 40 MG tablet Take 1 tablet (40 mg total) by mouth 2 (two) times daily before a meal. (Patient not taking: Reported on 05/13/2021) 180 tablet 3  ? thiamine (VITAMIN B-1) 50 MG tablet Take 1 tablet (50 mg total) by mouth daily. (Patient not taking: Reported on 05/07/2021) 90  tablet 1  ? tiZANidine (ZANAFLEX) 2 MG tablet TAKE 1 TABLET(2 MG) BY MOUTH DAILY (Patient not taking: Reported on 05/07/2021) 30 tablet 0  ? ?No current facility-administered medications on file prior to visit.  ? ?     ROS:  All others reviewed and negative. ? ?Objective  ? ?     PE:  BP 110/70 (BP Location: Right Arm, Patient Position: Sitting, Cuff Size: Large)   Pulse 64   Temp 98.9 ?F (37.2 ?C) (Oral)  Ht '6\' 2"'$  (1.88 m)   Wt 257 lb (116.6 kg)   SpO2 100%   BMI 33.00 kg/m?  ? ?              Constitutional: Pt appears in NAD ?              HENT: Head: NCAT.  ?              Right Ear: External ear normal.   ?              Left Ear: External ear normal.  ?              Eyes: . Pupils are equal, round, and reactive to light. Conjunctivae and EOM are normal ?              Nose: without d/c or deformity ?              Neck: Neck supple. Gross normal ROM ?              Cardiovascular: Normal rate and regular rhythm.   ?              Pulmonary/Chest: Effort normal and breath sounds without rales or wheezing.  ?              Abd:  Soft, NT, ND, + BS, no organomegaly ?              Neurological: Pt is alert. At baseline orientation, motor grossly intact ?              Skin: Skin is warm. No rashes, no other new lesions, LE edema - none ?              Psychiatric: Pt behavior is normal without agitation  ? ?Micro: none ? ?Cardiac tracings I have personally interpreted today:  none ? ?Pertinent Radiological findings (summarize): none  ? ?Lab Results  ?Component Value Date  ? WBC 4.1 06/14/2021  ? HGB 10.5 (L) 06/14/2021  ? HCT 33.6 (L) 06/14/2021  ? PLT 147 (L) 06/14/2021  ? GLUCOSE 72 06/14/2021  ? CHOL 132 06/07/2020  ? TRIG 162 (H) 08/21/2020  ? HDL 30.40 (L) 06/07/2020  ? [place] 72 06/07/2020  ? ALT 9 05/09/2021  ? AST 10 (L) 05/09/2021  ? NA 139 06/14/2021  ? K 4.4 06/14/2021  ? CL 100 06/14/2021  ? CREATININE 10.72 (H) 06/14/2021  ? BUN 27 (H) 06/14/2021  ? CO2 28 06/14/2021  ? TSH 0.50 06/07/2020  ? PSA 0.67  06/07/2020  ? INR 1.1 05/09/2021  ? HGBA1C 5.5 06/07/2020  ? ?Assessment/Plan:  ?Craig Flynn is a 61 y.o. Black or African American [2] male with  has a past medical history of Anemia (06/2015), Arthritis,

## 2021-06-19 NOTE — Patient Instructions (Signed)
You will be contacted regarding the referral for: carotid artery testing ? ?You will be contacted regarding the referral for: Neurology ? ?Please continue all other medications as before, and refills have been done if requested. ? ?Please have the pharmacy call with any other refills you may need. ? ?Please keep your appointments with your specialists as you may have planned - Cardiology tomorrow ? ? ? ? ? ?

## 2021-06-19 NOTE — Assessment & Plan Note (Signed)
Lab Results  ?Component Value Date  ? HGBA1C 5.5 06/07/2020  ? ?Stable, pt to continue current medical treatment  - diet ? ?

## 2021-06-19 NOTE — Assessment & Plan Note (Signed)
Etiology unclear, pt declines MRI due to $196 cost, ok for carotids, neurology referral, also has appt tomorrow with cardiology ?

## 2021-06-19 NOTE — Assessment & Plan Note (Signed)
BP Readings from Last 3 Encounters:  ?06/19/21 110/70  ?06/14/21 121/63  ?05/23/21 110/80  ? ?Stable, pt to continue medical treatment coreg ? ?

## 2021-06-20 ENCOUNTER — Encounter: Payer: Self-pay | Admitting: Neurology

## 2021-06-20 ENCOUNTER — Ambulatory Visit (HOSPITAL_COMMUNITY)
Admission: RE | Admit: 2021-06-20 | Discharge: 2021-06-20 | Disposition: A | Discharge status: Home / Self Care | Payer: Medicare HMO | Source: Ambulatory Visit | Attending: Cardiovascular Disease | Admitting: Cardiovascular Disease

## 2021-06-20 DIAGNOSIS — I129 Hypertensive chronic kidney disease with stage 1 through stage 4 chronic kidney disease, or unspecified chronic kidney disease: Secondary | ICD-10-CM | POA: Diagnosis not present

## 2021-06-20 DIAGNOSIS — R42 Dizziness and giddiness: Secondary | ICD-10-CM | POA: Diagnosis not present

## 2021-06-20 DIAGNOSIS — N2581 Secondary hyperparathyroidism of renal origin: Secondary | ICD-10-CM | POA: Diagnosis not present

## 2021-06-20 DIAGNOSIS — Z992 Dependence on renal dialysis: Secondary | ICD-10-CM | POA: Diagnosis not present

## 2021-06-20 DIAGNOSIS — N186 End stage renal disease: Secondary | ICD-10-CM | POA: Diagnosis not present

## 2021-06-22 ENCOUNTER — Encounter: Payer: Self-pay | Admitting: Internal Medicine

## 2021-06-23 DIAGNOSIS — N2581 Secondary hyperparathyroidism of renal origin: Secondary | ICD-10-CM | POA: Diagnosis not present

## 2021-06-23 DIAGNOSIS — N186 End stage renal disease: Secondary | ICD-10-CM | POA: Diagnosis not present

## 2021-06-23 DIAGNOSIS — Z992 Dependence on renal dialysis: Secondary | ICD-10-CM | POA: Diagnosis not present

## 2021-06-25 DIAGNOSIS — N186 End stage renal disease: Secondary | ICD-10-CM | POA: Diagnosis not present

## 2021-06-25 DIAGNOSIS — Z992 Dependence on renal dialysis: Secondary | ICD-10-CM | POA: Diagnosis not present

## 2021-06-25 DIAGNOSIS — N2581 Secondary hyperparathyroidism of renal origin: Secondary | ICD-10-CM | POA: Diagnosis not present

## 2021-06-27 DIAGNOSIS — N186 End stage renal disease: Secondary | ICD-10-CM | POA: Diagnosis not present

## 2021-06-27 DIAGNOSIS — N2581 Secondary hyperparathyroidism of renal origin: Secondary | ICD-10-CM | POA: Diagnosis not present

## 2021-06-27 DIAGNOSIS — Z992 Dependence on renal dialysis: Secondary | ICD-10-CM | POA: Diagnosis not present

## 2021-06-30 ENCOUNTER — Encounter (HOSPITAL_COMMUNITY): Payer: Self-pay | Admitting: Emergency Medicine

## 2021-06-30 ENCOUNTER — Emergency Department (HOSPITAL_COMMUNITY)
Admission: EM | Admit: 2021-06-30 | Discharge: 2021-06-30 | Disposition: A | Discharge status: Home / Self Care | Payer: Medicare HMO | Attending: Emergency Medicine | Admitting: Emergency Medicine

## 2021-06-30 ENCOUNTER — Other Ambulatory Visit: Payer: Self-pay

## 2021-06-30 ENCOUNTER — Emergency Department (HOSPITAL_COMMUNITY): Payer: Medicare HMO

## 2021-06-30 DIAGNOSIS — Z992 Dependence on renal dialysis: Secondary | ICD-10-CM | POA: Insufficient documentation

## 2021-06-30 DIAGNOSIS — N186 End stage renal disease: Secondary | ICD-10-CM | POA: Diagnosis not present

## 2021-06-30 DIAGNOSIS — I509 Heart failure, unspecified: Secondary | ICD-10-CM | POA: Diagnosis not present

## 2021-06-30 DIAGNOSIS — Z85850 Personal history of malignant neoplasm of thyroid: Secondary | ICD-10-CM | POA: Diagnosis not present

## 2021-06-30 DIAGNOSIS — Z7982 Long term (current) use of aspirin: Secondary | ICD-10-CM | POA: Diagnosis not present

## 2021-06-30 DIAGNOSIS — R531 Weakness: Secondary | ICD-10-CM | POA: Diagnosis not present

## 2021-06-30 DIAGNOSIS — I132 Hypertensive heart and chronic kidney disease with heart failure and with stage 5 chronic kidney disease, or end stage renal disease: Secondary | ICD-10-CM | POA: Insufficient documentation

## 2021-06-30 DIAGNOSIS — R519 Headache, unspecified: Secondary | ICD-10-CM | POA: Insufficient documentation

## 2021-06-30 DIAGNOSIS — R200 Anesthesia of skin: Secondary | ICD-10-CM | POA: Insufficient documentation

## 2021-06-30 DIAGNOSIS — Z79899 Other long term (current) drug therapy: Secondary | ICD-10-CM | POA: Insufficient documentation

## 2021-06-30 DIAGNOSIS — M5412 Radiculopathy, cervical region: Secondary | ICD-10-CM | POA: Insufficient documentation

## 2021-06-30 DIAGNOSIS — R42 Dizziness and giddiness: Secondary | ICD-10-CM | POA: Diagnosis not present

## 2021-06-30 LAB — CBC WITH DIFFERENTIAL/PLATELET
Abs Immature Granulocytes: 0.01 10*3/uL (ref 0.00–0.07)
Basophils Absolute: 0 10*3/uL (ref 0.0–0.1)
Basophils Relative: 0 %
Eosinophils Absolute: 0.1 10*3/uL (ref 0.0–0.5)
Eosinophils Relative: 3 %
HCT: 32.3 % — ABNORMAL LOW (ref 39.0–52.0)
Hemoglobin: 10.3 g/dL — ABNORMAL LOW (ref 13.0–17.0)
Immature Granulocytes: 0 %
Lymphocytes Relative: 41 %
Lymphs Abs: 1.6 10*3/uL (ref 0.7–4.0)
MCH: 29.1 pg (ref 26.0–34.0)
MCHC: 31.9 g/dL (ref 30.0–36.0)
MCV: 91.2 fL (ref 80.0–100.0)
Monocytes Absolute: 0.3 10*3/uL (ref 0.1–1.0)
Monocytes Relative: 8 %
Neutro Abs: 1.9 10*3/uL (ref 1.7–7.7)
Neutrophils Relative %: 48 %
Platelets: 139 10*3/uL — ABNORMAL LOW (ref 150–400)
RBC: 3.54 MIL/uL — ABNORMAL LOW (ref 4.22–5.81)
RDW: 14 % (ref 11.5–15.5)
WBC: 3.9 10*3/uL — ABNORMAL LOW (ref 4.0–10.5)
nRBC: 0 % (ref 0.0–0.2)

## 2021-06-30 LAB — BASIC METABOLIC PANEL
Anion gap: 11 (ref 5–15)
BUN: 45 mg/dL — ABNORMAL HIGH (ref 6–20)
CO2: 27 mmol/L (ref 22–32)
Calcium: 9.2 mg/dL (ref 8.9–10.3)
Chloride: 95 mmol/L — ABNORMAL LOW (ref 98–111)
Creatinine, Ser: 11.86 mg/dL — ABNORMAL HIGH (ref 0.61–1.24)
GFR, Estimated: 4 mL/min — ABNORMAL LOW (ref >60)
Glucose, Bld: 92 mg/dL (ref 70–99)
Potassium: 4.5 mmol/L (ref 3.5–5.1)
Sodium: 133 mmol/L — ABNORMAL LOW (ref 135–145)

## 2021-06-30 LAB — MAGNESIUM: Magnesium: 1.7 mg/dL (ref 1.7–2.4)

## 2021-06-30 MED ORDER — PROCHLORPERAZINE EDISYLATE 10 MG/2ML IJ SOLN
10.0000 mg | Freq: Once | INTRAMUSCULAR | Status: AC
  Administered 2021-06-30: 10 mg via INTRAVENOUS
  Filled 2021-06-30: qty 2

## 2021-06-30 MED ORDER — DIPHENHYDRAMINE HCL 50 MG/ML IJ SOLN
12.5000 mg | Freq: Once | INTRAMUSCULAR | Status: AC
  Administered 2021-06-30: 12.5 mg via INTRAVENOUS
  Filled 2021-06-30: qty 1

## 2021-06-30 NOTE — Discharge Instructions (Signed)
Your work-up and exam in the emergency room today was reassuring.  Your CT scan of the head did not show anything concerning.  CT scan of your neck did not show degenerative changes which could explain the numbness you are having in both of your arms.  I have given you a referral to neurosurgery.  Please call and schedule a follow-up appointment.  Otherwise follow-up with your primary care provider to continue your work-up for your ongoing headaches. ?

## 2021-06-30 NOTE — ED Triage Notes (Signed)
Patient coming from home, compliant of right arm weakness/numbness that started 2100 last night, also endorses headache that began 1 month ago.  ?

## 2021-06-30 NOTE — ED Notes (Signed)
Pt alert, NAD, calm, interactive, resps e/u, speaking in clear complete sentences, VSS. EDPA in to see pt, at [facility].  ?

## 2021-06-30 NOTE — ED Provider Notes (Signed)
?[facility] ?Provider Note ? ? ?CSN: [id number] ?Arrival date & time: 06/30/21  0865 ? ?  ? ?History ? ?Chief Complaint  ?Patient presents with  ? Headache  ? Extremity Weakness  ? ? ?Craig Flynn is a 61 y.o. male. ? ?61 year old male with past medical history of CHF, ESRD on dialysis, hypertension presents today for evaluation of persistent headache which has been ongoing for at least 2 months which was associated with numbness in bilateral upper extremities last night.  He states he has had numbness in his upper extremities before last night however it was more severe last night.  He states this episode of numbness lasted about 5 to 6 minutes.  He states it is worse after laying on the particular arm.  If he changes sides this resolves.  He denies any weakness, facial droop, difficulty speaking, difficulty walking.  This occurred around 9 PM last night.  Patient has undergone work-up for this in the emergency room as well as outpatient.  Patient recently had carotid Dopplers performed.  Patient states he had leftover pain medication that he took last night which improved his headache to the point he was able to sleep.  Currently endorses a 7/10 headache.  Denies acute vision change. ? ?The history is provided by the patient. No language interpreter was used.  ?Extremity Weakness ?Associated symptoms include headaches. Pertinent negatives include no chest pain.  ? ?  ? ?Home Medications ?Prior to Admission medications   ?Medication Sig Start Date End Date Taking? Authorizing Provider  ?acetaminophen (TYLENOL) 500 MG tablet Take 1,000 mg by mouth every 6 (six) hours as needed for moderate pain.    [provider]  ?albuterol (VENTOLIN HFA) 108 (90 Base) MCG/ACT inhaler INHALE 2 PUFFS INTO THE LUNGS EVERY 6 HOURS AS NEEDED FOR WHEEZING OR SHORTNESS OF BREATH ?Patient taking differently: Inhale 2 puffs into the lungs every 6 (six) hours as needed for wheezing or  shortness of breath. 08/01/20   Biagio Borg, MD  ?aspirin EC 81 MG tablet Take 81 mg by mouth daily.    [provider]  ?carvedilol (COREG) 25 MG tablet TAKE 1 TABLET(25 MG) BY MOUTH TWICE DAILY 12/05/20   Biagio Borg, MD  ?diazepam (VALIUM) 5 MG tablet 1 tab by mouth 45 min prior to MRI 05/23/21   Biagio Borg, MD  ?fenofibrate 54 MG tablet TAKE 1 TABLET BY MOUTH EVERY DAY 08/07/20   Minus Breeding, MD  ?iron polysaccharides (NIFEREX) 150 MG capsule Take 1 capsule (150 mg total) by mouth daily. ?Patient not taking: Reported on 05/07/2021 08/28/20   Patrecia Pour, MD  ?iron sucrose in sodium chloride 0.9 % 100 mL Iron Sucrose (Venofer) 09/25/20 09/17/21  [provider]  ?isosorbide mononitrate (IMDUR) 30 MG 24 hr tablet TAKE 2 TABLETS BY MOUTH DAILY. SCHEDULE OFFICE VISIT FOR FUTURE REFILLS 02/05/21   Minus Breeding, MD  ?ketoconazole (NIZORAL) 2 % cream Apply 1 application topically daily. 02/11/21   Lorenda Peck, MD  ?latanoprost (XALATAN) 0.005 % ophthalmic solution Place 1 drop into the left eye at bedtime. 05/08/21   [provider]  ?lidocaine-prilocaine (EMLA) cream Apply 1 application topically 3 (three) times a week. MWF 11/14/20   [provider]  ?nitroGLYCERIN (NITROSTAT) 0.4 MG SL tablet DISSOLVE 1 TABLET UNDER THE TONGUE EVERY 5 MINUTES AS NEEDED FOR CHEST PAIN ?Patient taking differently: Place 0.4 mg under the tongue every 5 (five) minutes as needed for chest pain.  03/29/19   Tami Lin, MD  ?oxyCODONE-acetaminophen (PERCOCET/ROXICET) 5-325 MG tablet Take 1 tablet by mouth every 6 (six) hours as needed for severe pain. 05/09/21   Loeffler, Adora Fridge, PA-C  ?pantoprazole (PROTONIX) 40 MG tablet Take 1 tablet (40 mg total) by mouth 2 (two) times daily before a meal. ?Patient not taking: Reported on 05/13/2021 01/17/21   Biagio Borg, MD  ?Potassium Chloride ER 20 MEQ TBCR Take 1 tablet by mouth daily. 11/26/20   [provider]  ?thiamine (VITAMIN B-1)  50 MG tablet Take 1 tablet (50 mg total) by mouth daily. ?Patient not taking: Reported on 05/07/2021 03/25/20   Janith Lima, MD  ?tiZANidine (ZANAFLEX) 2 MG tablet TAKE 1 TABLET(2 MG) BY MOUTH DAILY ?Patient not taking: Reported on 05/07/2021 04/10/21   Maximiano Coss, NP  ?vitamin B-12 (CYANOCOBALAMIN) 1000 MCG tablet Take 1,000 mcg by mouth daily.    [provider]  ?   ? ?Allergies    ?Patient has no known allergies.   ? ?Review of Systems   ?Review of Systems  ?Constitutional:  Negative for chills and fever.  ?Cardiovascular:  Negative for chest pain.  ?Gastrointestinal:  Negative for nausea and vomiting.  ?Neurological:  Positive for headaches. Negative for syncope, weakness and light-headedness.  ?All other systems reviewed and are negative. ? ?Physical Exam ?Updated Vital Signs ?BP 116/61   Pulse 71   Temp 97.6 ?F (36.4 ?C) (Oral)   Resp 20   SpO2 100%  ?Physical Exam ?Vitals and nursing note reviewed.  ?Constitutional:   ?   General: He is not in acute distress. ?   Appearance: Normal appearance. He is not ill-appearing.  ?HENT:  ?   Head: Normocephalic and atraumatic.  ?   Nose: Nose normal.  ?Eyes:  ?   General: No scleral icterus. ?   Extraocular Movements: Extraocular movements intact.  ?   Conjunctiva/sclera: Conjunctivae normal.  ?Cardiovascular:  ?   Rate and Rhythm: Normal rate and regular rhythm.  ?   Pulses: Normal pulses.  ?   Heart sounds: Normal heart sounds.  ?Pulmonary:  ?   Effort: Pulmonary effort is normal. No respiratory distress.  ?   Breath sounds: Normal breath sounds. No wheezing or rales.  ?Abdominal:  ?   General: There is no distension.  ?   Tenderness: There is no abdominal tenderness.  ?Musculoskeletal:     ?   General: Normal range of motion.  ?   Cervical back: Normal range of motion.  ?Skin: ?   General: Skin is warm and dry.  ?Neurological:  ?   General: No focal deficit present.  ?   Mental Status: He is alert. Mental status is at baseline.  ?   Comments:  Cranial nerves III through XII intact.  Without dysarthria.  Without facial droop.  Bilateral upper extremities with full range of motion, 5/5 strength in flexor and extensor muscle groups, intact sensation.  Bilateral lower extremities with full range of motion, 5/5 strength in flexor and extensor muscle groups, sensation intact.  Finger-to-nose intact.  Heel-to-shin intact.  Without pronator drift.  ? ? ?ED Results / Procedures / Treatments   ?Labs ?(all labs ordered are listed, but only abnormal results are displayed) ?Labs Reviewed - No data to display ? ?EKG ?None ? ?Radiology ?No results found. ? ?Procedures ?Procedures  ? ? ?Medications Ordered in ED ?Medications - No data to display ? ?ED Course/ Medical Decision Making/ A&P ?  ?                        ?  Medical Decision Making ?Amount and/or Complexity of Data Reviewed ?Labs: ordered. ?Radiology: ordered. ? ?Risk ?Prescription drug management. ? ? ?Medical Decision Making / ED Course ? ? ?This patient presents to the ED for concern of headache, weakness, this involves an extensive number of treatment options, and is a complaint that carries with it a high risk of complications and morbidity.  The differential diagnosis includes migraine, CVA, cervical radiculopathy ? ?MDM: ?61 year old male presents today for evaluation of headache, numbness to his bilateral upper extremities.  Episode occurred last night around 9 PM.  Numbness in his upper extremities have resolved since then.  Episodes have been ongoing for weeks prior to last night.  Headaches have been ongoing for couple months at this point.  He is undergoing outpatient work-up.  Patient's neurological exam is nonfocal.  He is at his baseline.  He is alert and oriented.  Will provide migraine cocktail, evaluate with CBC, BMP, CT head, CT cervical spine. ?CBC with mild leukopenia at 3.9 however without neutropenia, hemoglobin of 10.3 however this is around his baseline.  BMP with creatinine of 11.86  which is around his baseline with his end-stage renal disease otherwise without acute findings.  Mag is within normal limits.  CT head without acute intracranial finding.  CT cervical spine with evidence of mild ossificati

## 2021-06-30 NOTE — ED Notes (Signed)
Alert, NAD, calm, interactive, speaking in clear complete sentences.  Mentions always sob. Endorses light headed/ dizziness. Denies HA. Mentions "mild intermittent CP, but not now". Denies fever, NVD, current pain, change in vision, change in breathing, or recent illness.  ?

## 2021-07-01 DIAGNOSIS — Z992 Dependence on renal dialysis: Secondary | ICD-10-CM | POA: Diagnosis not present

## 2021-07-01 DIAGNOSIS — N186 End stage renal disease: Secondary | ICD-10-CM | POA: Diagnosis not present

## 2021-07-01 DIAGNOSIS — N2581 Secondary hyperparathyroidism of renal origin: Secondary | ICD-10-CM | POA: Diagnosis not present

## 2021-07-02 DIAGNOSIS — N186 End stage renal disease: Secondary | ICD-10-CM | POA: Diagnosis not present

## 2021-07-02 DIAGNOSIS — Z992 Dependence on renal dialysis: Secondary | ICD-10-CM | POA: Diagnosis not present

## 2021-07-02 DIAGNOSIS — N2581 Secondary hyperparathyroidism of renal origin: Secondary | ICD-10-CM | POA: Diagnosis not present

## 2021-07-03 ENCOUNTER — Ambulatory Visit (HOSPITAL_COMMUNITY): Payer: Medicare HMO | Attending: Cardiology

## 2021-07-03 ENCOUNTER — Encounter: Payer: Self-pay | Admitting: Internal Medicine

## 2021-07-03 DIAGNOSIS — I5030 Unspecified diastolic (congestive) heart failure: Secondary | ICD-10-CM

## 2021-07-03 DIAGNOSIS — H349 Unspecified retinal vascular occlusion: Secondary | ICD-10-CM | POA: Diagnosis not present

## 2021-07-03 LAB — ECHOCARDIOGRAM COMPLETE
Area-P 1/2: 3.85 cm2
S' Lateral: 3 cm

## 2021-07-04 DIAGNOSIS — N186 End stage renal disease: Secondary | ICD-10-CM | POA: Diagnosis not present

## 2021-07-04 DIAGNOSIS — N2581 Secondary hyperparathyroidism of renal origin: Secondary | ICD-10-CM | POA: Diagnosis not present

## 2021-07-04 DIAGNOSIS — Z992 Dependence on renal dialysis: Secondary | ICD-10-CM | POA: Diagnosis not present

## 2021-07-07 DIAGNOSIS — Z992 Dependence on renal dialysis: Secondary | ICD-10-CM | POA: Diagnosis not present

## 2021-07-07 DIAGNOSIS — N2581 Secondary hyperparathyroidism of renal origin: Secondary | ICD-10-CM | POA: Diagnosis not present

## 2021-07-07 DIAGNOSIS — N186 End stage renal disease: Secondary | ICD-10-CM | POA: Diagnosis not present

## 2021-07-09 DIAGNOSIS — Z992 Dependence on renal dialysis: Secondary | ICD-10-CM | POA: Diagnosis not present

## 2021-07-09 DIAGNOSIS — N186 End stage renal disease: Secondary | ICD-10-CM | POA: Diagnosis not present

## 2021-07-09 DIAGNOSIS — N2581 Secondary hyperparathyroidism of renal origin: Secondary | ICD-10-CM | POA: Diagnosis not present

## 2021-07-11 DIAGNOSIS — Z992 Dependence on renal dialysis: Secondary | ICD-10-CM | POA: Diagnosis not present

## 2021-07-11 DIAGNOSIS — N186 End stage renal disease: Secondary | ICD-10-CM | POA: Diagnosis not present

## 2021-07-11 DIAGNOSIS — N2581 Secondary hyperparathyroidism of renal origin: Secondary | ICD-10-CM | POA: Diagnosis not present

## 2021-07-14 DIAGNOSIS — Z992 Dependence on renal dialysis: Secondary | ICD-10-CM | POA: Diagnosis not present

## 2021-07-14 DIAGNOSIS — N2581 Secondary hyperparathyroidism of renal origin: Secondary | ICD-10-CM | POA: Diagnosis not present

## 2021-07-14 DIAGNOSIS — N186 End stage renal disease: Secondary | ICD-10-CM | POA: Diagnosis not present

## 2021-07-15 ENCOUNTER — Ambulatory Visit (INDEPENDENT_AMBULATORY_CARE_PROVIDER_SITE_OTHER): Payer: Medicare HMO | Admitting: Neurology

## 2021-07-15 ENCOUNTER — Encounter: Payer: Self-pay | Admitting: Neurology

## 2021-07-15 ENCOUNTER — Other Ambulatory Visit (INDEPENDENT_AMBULATORY_CARE_PROVIDER_SITE_OTHER): Payer: Medicare HMO

## 2021-07-15 VITALS — BP 125/78 | HR 78 | Ht 74.0 in | Wt 262.0 lb

## 2021-07-15 DIAGNOSIS — R519 Headache, unspecified: Secondary | ICD-10-CM

## 2021-07-15 DIAGNOSIS — R42 Dizziness and giddiness: Secondary | ICD-10-CM

## 2021-07-15 LAB — SEDIMENTATION RATE: Sed Rate: 17 mm/hr (ref 0–20)

## 2021-07-15 LAB — C-REACTIVE PROTEIN: CRP: <1 mg/dL (ref 0.5–20.0)

## 2021-07-15 NOTE — Progress Notes (Signed)
? ?NEUROLOGY CONSULTATION NOTE ? ?[name] ?MRN: [id number] ?DOB: 1960-04-05 ? ?Referring provider: Dr. Cathlean Cower ?Primary care provider: Dr. Cathlean Cower ? ?Reason for consult:  postural lightheadedness ? ?Dear Dr Jeneen Rinks: ? ?Thank you for your kind referral of KAVEH KISSINGER for consultation of the above symptoms. Although his history is well known to you, please allow me to reiterate it for the purpose of our medical record. He is alone in the office today.  Records and images were personally reviewed where available. ? ? ?HISTORY OF PRESENT ILLNESS: ?This is a pleasant 61 year old right-handed man with a history of hypertension, hypertensive ESRD on HD, thyroid cancer s/p thyroidectomy, presenting for evaluation of postural lightheadedness. He reports symptoms started when he had the throbbing head pain in February 2023. Throbbing is localized over the right parietal region, it throbbed so bad it made it head hurt. There was also associated lightheadedness, no spinning sensation. The pulsing comes and goes, every now and then it comes "real bad" and when he moves his head he hears cracking in his neck, no neck pain. The lightheadedness occurs in any position, however he notes that when he lays down, it eases off but the pulsing continues. He monitors his BP at home and reports it is mostly pretty low, usually 120/80. BP today went from 100/52 supine to 86/52 standing. He states sometimes BP at home goes as low as the 80s. No loss of consciousness. He has seen his cardiologist Dr. Percival Spanish who suggested holding the Coreg but he has not done it yet. He has been to the ER for the headaches when they are 10/10 in intensity. He has had repeated head CT scans in 04/2021, 05/2021, and 06/2021 with no acute changes. He usually takes Tylenol or Ibuprofen and lays down, with pain easing in 1-1.5 hours. Pain is worse at night, he has been taking Tylenol/Ibuprofen daily for the past 1.5 weeks. There is no associated  nausea/vomiting, photo/phonophobia, visual obscurations. When symptoms first started, he was in dialysis and BP dropped. He had dots in his right eye and saw an eye doctor who told him he had a "clot in one of the veins in the eyeball." He denies any diplopia,dysarthria/dysphagia. He has occasional numbness in his arms that he attributes to the fistula. Face and legs unaffected. Sleep is pretty good, 7-8 hours at night.  ? ?Carotid dopplers done 05/2021 showed 1-39% stenosis in bilateral ICA, antegrade vertebral flow.  ? ?Head CT in Feb, March, April 2023 unremarkable. CT cervical spine showed cervical and upper thoracic spondylosis,mild ?ossification of posterior longitudinal ligament at C3-C4 and C4-C5, multilevel spinal canal narrowing, greatest at C3-C4 and C4-C5 (moderate at these levels), mild-to-moderate bony neural foraminal narrowing on the left at T1-T2. No more than mild bony neural foraminal narrowing at the remaining levels ? ? ? ?PAST MEDICAL HISTORY: ?Past Medical History:  ?Diagnosis Date  ? Anemia 06/2015  ? Arthritis   ? knee  ? Cancer [facility])   ? thyroid cancer  ? Cardiomyopathy ([place])   ? a. 10/2013: EF reduced to 35-40% b. 09/2014: EF improved to 55-60%, Grade 1 DD noted.  ? Chest pain 06/2015  ? CHF (congestive heart failure) ([place])   ? CKD (chronic kidney disease), stage IV ([place])   ? sees  Dr Lorrene Reid  ? Dyspnea   ? with exerion   ? GERD (gastroesophageal reflux disease)   ? Glaucoma   ? Gout   ? Hypertension   ?  Obesity   ? Pancreatitis   ? Sleep apnea   ? not wearing c-pap now, needs new slep study per pt. (01/22/2017)  ? Tuberculosis 1981  ? while the pt was in the service.  ? Tubular adenoma of colon 10/2015  ? ? ?PAST SURGICAL HISTORY: ?Past Surgical History:  ?Procedure Laterality Date  ? AV FISTULA PLACEMENT Left 03/25/2016  ? Procedure: Creation of Left Arm ARTERIOVENOUS (AV) FISTULA;  Surgeon: Elam Dutch, MD;  Location: [place];  Service: Vascular;  Laterality: Left;  ? AV FISTULA PLACEMENT  Left 08/27/2020  ? Procedure: LEFT BASILIC VEIN ARTERIOVENOUS (AV) FISTULA CREATION;  Surgeon: Rosetta Posner, MD;  Location: [place];  Service: Vascular;  Laterality: Left;  ? BASCILIC VEIN TRANSPOSITION Left 10/22/2020  ? Procedure: LEFT SECOND STAGE BASILIC VEIN TRANSPOSITION;  Surgeon: Elam Dutch, MD;  Location[facility] OR;  Service: Vascular;  Laterality: Left;  ? COLONOSCOPY W/ BIOPSIES AND POLYPECTOMY    ? "no problem"  ? HERNIA REPAIR    ? IR FLUORO GUIDE CV LINE RIGHT  08/22/2020  ? IR US GUIDE VASC ACCESS RIGHT  08/22/2020  ? LAPAROSCOPIC CHOLECYSTECTOMY    ? LIGATION OF COMPETING BRANCHES OF ARTERIOVENOUS FISTULA Left 01/25/2018  ? Procedure: LIGATION OF COMPETING BRANCHES And Revision of ARTERIOVENOUS FISTULA LEFT ARM.;  Surgeon: Elam Dutch, MD;  Location: [place];  Service: Vascular;  Laterality: Left;  ? THYROIDECTOMY  01/22/2017  ? THYROIDECTOMY Left 01/22/2017  ? Procedure: THYROIDECTOMY;  Surgeon: Melida Quitter, MD;  Location: [facility];  Service: ENT;  Laterality: Left;  ? THYROIDECTOMY Right 03/26/2017  ? Procedure: COMPLETION OF THYROIDECTOMY;  Surgeon: Melida Quitter, MD;  Location: [facility];  Service: ENT;  Laterality: Right;  ? UMBILICAL HERNIA REPAIR    ? WISDOM TOOTH EXTRACTION    ? ? ?MEDICATIONS: ?Current Outpatient Medications on File Prior to Visit  ?Medication Sig Dispense Refill  ? acetaminophen (TYLENOL) 500 MG tablet Take 1,000 mg by mouth every 6 (six) hours as needed for moderate pain.    ? albuterol (VENTOLIN HFA) 108 (90 Base) MCG/ACT inhaler INHALE 2 PUFFS INTO THE LUNGS EVERY 6 HOURS AS NEEDED FOR WHEEZING OR SHORTNESS OF BREATH (Patient taking differently: Inhale 2 puffs into the lungs every 6 (six) hours as needed for wheezing or shortness of breath.) 18 g 5  ? aspirin EC 81 MG tablet Take 81 mg by mouth daily.    ? carvedilol (COREG) 25 MG tablet TAKE 1 TABLET(25 MG) BY MOUTH TWICE DAILY 180 tablet 2  ? diazepam (VALIUM) 5 MG tablet 1 tab by mouth 45 min prior to MRI 1 tablet 0  ?  fenofibrate 54 MG tablet TAKE 1 TABLET BY MOUTH EVERY DAY 60 tablet 0  ? iron sucrose in sodium chloride 0.9 % 100 mL Iron Sucrose (Venofer)    ? isosorbide mononitrate (IMDUR) 30 MG 24 hr tablet TAKE 2 TABLETS BY MOUTH DAILY. SCHEDULE OFFICE VISIT FOR FUTURE REFILLS 180 tablet 3  ? ketoconazole (NIZORAL) 2 % cream Apply 1 application topically daily. 60 g 2  ? latanoprost (XALATAN) 0.005 % ophthalmic solution Place 1 drop into the left eye at bedtime.    ? lidocaine-prilocaine (EMLA) cream Apply 1 application topically 3 (three) times a week. MWF    ? nitroGLYCERIN (NITROSTAT) 0.4 MG SL tablet DISSOLVE 1 TABLET UNDER THE TONGUE EVERY 5 MINUTES AS NEEDED FOR CHEST PAIN (Patient taking differently: Place 0.4 mg under the tongue every 5 (five) minutes as needed  for chest pain.) 25 tablet 3  ? vitamin B-12 (CYANOCOBALAMIN) 1000 MCG tablet Take 1,000 mcg by mouth daily.    ? oxyCODONE-acetaminophen (PERCOCET/ROXICET) 5-325 MG tablet Take 1 tablet by mouth every 6 (six) hours as needed for severe pain. (Patient not taking: Reported on 07/15/2021) 15 tablet 0  ? pantoprazole (PROTONIX) 40 MG tablet Take 1 tablet (40 mg total) by mouth 2 (two) times daily before a meal. (Patient not taking: Reported on 05/13/2021) 180 tablet 3  ? Potassium Chloride ER 20 MEQ TBCR Take 1 tablet by mouth daily. (Patient not taking: Reported on 07/15/2021)    ? thiamine (VITAMIN B-1) 50 MG tablet Take 1 tablet (50 mg total) by mouth daily. (Patient not taking: Reported on 05/07/2021) 90 tablet 1  ? tiZANidine (ZANAFLEX) 2 MG tablet TAKE 1 TABLET(2 MG) BY MOUTH DAILY (Patient not taking: Reported on 05/07/2021) 30 tablet 0  ? ?No current facility-administered medications on file prior to visit.  ? ? ?ALLERGIES: ?No Known Allergies ? ?FAMILY HISTORY: ?Family History  ?Problem Relation Age of Onset  ? Diabetes Mother   ? Stroke Mother   ? Pneumonia Father   ?     died of Pneumonia x3  ? Kidney disease Maternal Grandmother   ? Hypertension Maternal  Grandmother   ? Healthy Maternal Grandfather   ? Healthy Paternal Grandmother   ? Healthy Paternal Grandfather   ? CAD Neg Hx   ? Colon cancer Neg Hx   ? Esophageal cancer Neg Hx   ? Pancreatic cancer Neg Hx   ? Prostat

## 2021-07-15 NOTE — Patient Instructions (Addendum)
Good to meet you. ? ?Bloodwork for  ESR, CRP ? ?2. Proceed with scheduling MRI brain ? ?3. Hold Coreg as instructed by Dr. Percival Spanish and see if this helps with the lightheadedness ? ?4. If tests are normal, we will plan to start Gabapentin every night as a headache preventative medication. Minimize Tylenol and Ibuprofen to 2-3 times a week to avoid rebound headaches ? ?5. Follow-up in 4 months, call for any changes ?

## 2021-07-16 DIAGNOSIS — Z992 Dependence on renal dialysis: Secondary | ICD-10-CM | POA: Diagnosis not present

## 2021-07-16 DIAGNOSIS — N2581 Secondary hyperparathyroidism of renal origin: Secondary | ICD-10-CM | POA: Diagnosis not present

## 2021-07-16 DIAGNOSIS — N186 End stage renal disease: Secondary | ICD-10-CM | POA: Diagnosis not present

## 2021-07-17 ENCOUNTER — Telehealth: Payer: Self-pay | Admitting: Internal Medicine

## 2021-07-17 NOTE — Telephone Encounter (Signed)
Spoke with patient and notified that there are no MRI results in Epic and CT scan was ordered by another physician, so he would need to contact that provider.  ?

## 2021-07-17 NOTE — Telephone Encounter (Signed)
Pt requesting cb from assistant in regards to MRI & Craig Flynn that were ordered to discuss results.  ?

## 2021-07-18 DIAGNOSIS — N2581 Secondary hyperparathyroidism of renal origin: Secondary | ICD-10-CM | POA: Diagnosis not present

## 2021-07-18 DIAGNOSIS — N186 End stage renal disease: Secondary | ICD-10-CM | POA: Diagnosis not present

## 2021-07-18 DIAGNOSIS — Z992 Dependence on renal dialysis: Secondary | ICD-10-CM | POA: Diagnosis not present

## 2021-07-20 DIAGNOSIS — Z992 Dependence on renal dialysis: Secondary | ICD-10-CM | POA: Diagnosis not present

## 2021-07-20 DIAGNOSIS — N186 End stage renal disease: Secondary | ICD-10-CM | POA: Diagnosis not present

## 2021-07-20 DIAGNOSIS — I129 Hypertensive chronic kidney disease with stage 1 through stage 4 chronic kidney disease, or unspecified chronic kidney disease: Secondary | ICD-10-CM | POA: Diagnosis not present

## 2021-07-21 DIAGNOSIS — N2581 Secondary hyperparathyroidism of renal origin: Secondary | ICD-10-CM | POA: Diagnosis not present

## 2021-07-21 DIAGNOSIS — Z992 Dependence on renal dialysis: Secondary | ICD-10-CM | POA: Diagnosis not present

## 2021-07-21 DIAGNOSIS — N186 End stage renal disease: Secondary | ICD-10-CM | POA: Diagnosis not present

## 2021-07-22 ENCOUNTER — Telehealth: Payer: Self-pay

## 2021-07-22 NOTE — Telephone Encounter (Signed)
-----   Message from Cameron Sprang, MD sent at 07/16/2021  9:07 AM EDT ----- ?Pls let him know  bloodwork normal, no inflammation seen, proceed with scheduling brain MRI, thanks ?

## 2021-07-22 NOTE — Telephone Encounter (Signed)
Pt called per DPR left a voice mail with results  bloodwork normal, no inflammation seen, proceed with scheduling brain MRI ?

## 2021-07-23 DIAGNOSIS — Z992 Dependence on renal dialysis: Secondary | ICD-10-CM | POA: Diagnosis not present

## 2021-07-23 DIAGNOSIS — N2581 Secondary hyperparathyroidism of renal origin: Secondary | ICD-10-CM | POA: Diagnosis not present

## 2021-07-23 DIAGNOSIS — N186 End stage renal disease: Secondary | ICD-10-CM | POA: Diagnosis not present

## 2021-07-25 DIAGNOSIS — Z992 Dependence on renal dialysis: Secondary | ICD-10-CM | POA: Diagnosis not present

## 2021-07-25 DIAGNOSIS — N2581 Secondary hyperparathyroidism of renal origin: Secondary | ICD-10-CM | POA: Diagnosis not present

## 2021-07-25 DIAGNOSIS — N186 End stage renal disease: Secondary | ICD-10-CM | POA: Diagnosis not present

## 2021-07-28 DIAGNOSIS — N186 End stage renal disease: Secondary | ICD-10-CM | POA: Diagnosis not present

## 2021-07-28 DIAGNOSIS — Z992 Dependence on renal dialysis: Secondary | ICD-10-CM | POA: Diagnosis not present

## 2021-07-28 DIAGNOSIS — N2581 Secondary hyperparathyroidism of renal origin: Secondary | ICD-10-CM | POA: Diagnosis not present

## 2021-07-29 ENCOUNTER — Ambulatory Visit (INDEPENDENT_AMBULATORY_CARE_PROVIDER_SITE_OTHER): Payer: Medicare HMO | Admitting: *Deleted

## 2021-07-29 DIAGNOSIS — I5032 Chronic diastolic (congestive) heart failure: Secondary | ICD-10-CM

## 2021-07-29 DIAGNOSIS — N185 Chronic kidney disease, stage 5: Secondary | ICD-10-CM

## 2021-07-29 NOTE — Patient Instructions (Signed)
Visit Information ? ?Craig Flynn, thank you for taking time to talk with me today. Please don't hesitate to contact me if I can be of assistance to you before our next scheduled telephone appointment ? ?Below are the goals we discussed today:  ?Patient Self-Care Activities: ?Take medications as prescribed   ?Attend all scheduled provider appointments  ?Call pharmacy for medication refills  ?Call provider office for new concerns or questions  ?Continue to follow heart healthy, low salt, renal diet ?Continue to attend hemodialysis sessions M-W-F as scheduled ?Continue to stay active and walk for exercise daily ?Continue to maintain communication with your neurologist about the headaches you are experiencing ? ?Our next scheduled telephone follow up visit/ appointment is scheduled on: Tuesday, October 14, 2021 at 9:00 am- This is a PHONE [place] appointment ? ?If you need to cancel or re-schedule our visit, please call [phone] and our care guide team will be happy to assist you. ?  ?I look forward to hearing about your progress. ?  ?Oneta Rack, RN, BSN, CCRN Alumnus ?[place] ?([phone]: direct office ? ?If you are experiencing a Mental Health or [place] or need someone to talk to, please  ?call the Suicide and Crisis Lifeline: 988 ?call the [place] National Suicide Prevention Lifeline: 312 833 3445 or TTY: (510) 413-0323 TTY 410-334-5121) to talk to a trained counselor ?call 1-800-273-TALK (toll free, 24 hour hotline) ?go to [facility] [address], [place] [phone]) ?call 911  ? ?The patient verbalized understanding of instructions, educational materials, and care plan provided today and agreed to receive a mailed copy of patient instructions, educational materials, and care plan ? ?General Headache Without Cause ?A headache is pain or discomfort you feel around the head or neck area. There are many causes  and types of headaches. In some cases, the cause may not be found. ?Follow these instructions at home: ?Watch your condition for any changes. Let your doctor know about them. Take these steps to help with your condition: ?Managing pain ? ?  ? ?Take over-the-counter and prescription medicines only as told by your doctor. This includes medicines for pain that are taken by mouth or put on the skin. ?Lie down in a dark, quiet room when you have a headache. ?If told, put ice on your head and neck area: ?Put ice in a plastic bag. ?Place a towel between your skin and the bag. ?Leave the ice on for 20 minutes, 2-3 times per day. ?Take off the ice if your skin turns bright red. This is very important. If you cannot feel pain, heat, or cold, you have a greater risk of damage to the area. ?If told, put heat on the affected area. Use the heat source that your doctor recommends, such as a moist heat pack or a heating pad. ?Place a towel between your skin and the heat source. ?Leave the heat on for 20-30 minutes. ?Take off the heat if your skin turns bright red. This is very important. If you cannot feel pain, heat, or cold, you have a greater risk of getting burned. ?Keep lights dim if bright lights bother you or make your headaches worse. ?Eating and drinking ?Eat meals on a regular schedule. ?If you drink alcohol: ?Limit how much you have to: ?0-1 drink a day for women who are not pregnant. ?0-2 drinks a day for men. ?Know how much alcohol is in a drink. In the [place] one drink equals one  12 oz bottle of beer (355 mL), one 5 oz glass of wine (148 mL), or one 1? oz glass of hard liquor (44 mL). ?Stop drinking caffeine, or drink less caffeine. ?General instructions ? ?Keep a journal to find out if certain things bring on headaches. For example, write down: ?What you eat and drink. ?How much sleep you get. ?Any change to your diet or medicines. ?Get a massage or try other ways to relax. ?Limit stress. ?Sit up straight. Do not  tighten (tense) your muscles. ?Do not smoke or use any products that contain nicotine or tobacco. If you need help quitting, ask your doctor. ?Exercise regularly as told by your doctor. ?Get enough sleep. This often means 7-9 hours of sleep each night. ?Keep all follow-up visits. This is important. ?Contact a doctor if: ?Medicine does not help your symptoms. ?You have a headache that feels different than the other headaches. ?You feel like you may vomit (nauseous) or you vomit. ?You have a fever. ?Get help right away if: ?Your headache: ?Gets very bad quickly. ?Gets worse after a lot of physical activity. ?You have any of these symptoms: ?You continue to vomit. ?A stiff neck. ?Trouble seeing. ?Your eye or ear hurts. ?Trouble speaking. ?Weak muscles or you lose muscle control. ?You lose your balance or have trouble walking. ?You feel like you will pass out (faint) or you pass out. ?You are mixed up (confused). ?You have a seizure. ?These symptoms may be an emergency. Get help right away. Call your local emergency services (911 in the [place] ?Do not wait to see if the symptoms will go away. ?Do not drive yourself to the hospital. ?Summary ?A headache is pain or discomfort that is felt around the head or neck area. ?There are many causes and types of headaches. In some cases, the cause may not be found. ?Keep a journal to help find out what causes your headaches. Watch your condition for any changes. Let your doctor know about them. ?Contact a doctor if you have a headache that is different from usual, or if medicine does not help your headache. ?Get help right away if your headache gets very bad, you throw up, you have trouble seeing, you lose your balance, or you have a seizure. ?This information is not intended to replace advice given to you by your health care provider. Make sure you discuss any questions you have with your health care provider. ?Document Revised: 08/07/2020 Document Reviewed: 08/07/2020 ?Elsevier  Patient Education ? [place]. ? ? ?

## 2021-07-29 NOTE — Chronic Care Management (AMB) (Signed)
?Chronic Care Management  ? ?CCM RN Visit Note ? ?07/29/2021 ?Name: DEQUARIUS JEFFRIES MRN: [id number] DOB: [birth year] ? ?Subjective: ?YERIK ZERINGUE is a 61 y.o. year old male who is a primary care patient of Vandy, Tsuchiya, MD. The care management team was consulted for assistance with disease management and care coordination needs.   ? ?Engaged with patient by telephone for follow up visit in response to provider referral for case management and/or care coordination services.  ? ?Consent to Services:  ?The patient was given information about Chronic Care Management services, agreed to services, and gave verbal consent prior to initiation of services.  Please see initial visit note for detailed documentation.  ?Patient agreed to services and verbal consent obtained.  ? ?Assessment: Review of patient past medical history, allergies, medications, health status, including review of consultants reports, laboratory and other test data, was performed as part of comprehensive evaluation and provision of chronic care management services.  ? ?SDOH (Social Determinants of Health) assessments and interventions performed:  ?SDOH Interventions   ? ?Flowsheet Row Most Recent Value  ?SDOH Interventions   ?Food Insecurity Interventions Intervention Not Indicated  [Continues to deny food insecurity]  ?Housing Interventions Intervention Not Indicated  [Continues to reside alone in apartment,  denies housing concerns]  ?Transportation Interventions Intervention Not Indicated  [continues to drive self,  family assists as/ if indicated]  ? ?  ?CCM Care Plan ? ?No Known Allergies ? ?Outpatient Encounter Medications as of 07/29/2021  ?Medication Sig Note  ? acetaminophen (TYLENOL) 500 MG tablet Take 1,000 mg by mouth every 6 (six) hours as needed for moderate pain.   ? albuterol (VENTOLIN HFA) 108 (90 Base) MCG/ACT inhaler INHALE 2 PUFFS INTO THE LUNGS EVERY 6 HOURS AS NEEDED FOR WHEEZING OR SHORTNESS OF BREATH (Patient taking differently: Inhale  2 puffs into the lungs every 6 (six) hours as needed for wheezing or shortness of breath.) 12/19/2020: 12/19/20- reports has not needed recently  ? aspirin EC 81 MG tablet Take 81 mg by mouth daily.   ? carvedilol (COREG) 25 MG tablet TAKE 1 TABLET(25 MG) BY MOUTH TWICE DAILY 12/19/2020: 12/19/20-- reports taking around Hemodialysis: does not take in am on mornings he gets HD (M-W-F)  ? diazepam (VALIUM) 5 MG tablet 1 tab by mouth 45 min prior to MRI   ? fenofibrate 54 MG tablet TAKE 1 TABLET BY MOUTH EVERY DAY   ? iron sucrose in sodium chloride 0.9 % 100 mL Iron Sucrose (Venofer)   ? isosorbide mononitrate (IMDUR) 30 MG 24 hr tablet TAKE 2 TABLETS BY MOUTH DAILY. SCHEDULE OFFICE VISIT FOR FUTURE REFILLS   ? ketoconazole (NIZORAL) 2 % cream Apply 1 application topically daily.   ? latanoprost (XALATAN) 0.005 % ophthalmic solution Place 1 drop into the left eye at bedtime.   ? lidocaine-prilocaine (EMLA) cream Apply 1 application topically 3 (three) times a week. MWF   ? nitroGLYCERIN (NITROSTAT) 0.4 MG SL tablet DISSOLVE 1 TABLET UNDER THE TONGUE EVERY 5 MINUTES AS NEEDED FOR CHEST PAIN (Patient taking differently: Place 0.4 mg under the tongue every 5 (five) minutes as needed for chest pain.) 12/19/2020: 12/19/20: Reports has never needed to use  ? oxyCODONE-acetaminophen (PERCOCET/ROXICET) 5-325 MG tablet Take 1 tablet by mouth every 6 (six) hours as needed for severe pain. (Patient not taking: Reported on 07/15/2021)   ? pantoprazole (PROTONIX) 40 MG tablet Take 1 tablet (40 mg total) by mouth 2 (two) times daily before a meal. (Patient not taking:  Reported on 05/13/2021)   ? Potassium Chloride ER 20 MEQ TBCR Take 1 tablet by mouth daily. (Patient not taking: Reported on 07/15/2021)   ? thiamine (VITAMIN B-1) 50 MG tablet Take 1 tablet (50 mg total) by mouth daily. (Patient not taking: Reported on 05/07/2021)   ? tiZANidine (ZANAFLEX) 2 MG tablet TAKE 1 TABLET(2 MG) BY MOUTH DAILY (Patient not taking: Reported on  05/07/2021)   ? vitamin B-12 (CYANOCOBALAMIN) 1000 MCG tablet Take 1,000 mcg by mouth daily.   ? ?No facility-administered encounter medications on file as of 07/29/2021.  ? ?Patient Active Problem List  ? Diagnosis Date Noted  ? Postural lightheadedness 06/19/2021  ? Pulsatile tinnitus of right ear 05/25/2021  ? 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 Pain, unspecified 01/13/2021  ? Mild protein-calorie malnutrition ([place]) 10/07/2020  ? Allergy, unspecified, initial encounter 08/28/2020  ? 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/28/2020  ? Cardiomyopathy, unspecified ([place]) 08/28/2020  ? Complication of vascular dialysis catheter 08/28/2020  ? Other specified coagulation defects ([place]) 08/28/2020  ? End stage renal disease ([place]) 08/28/2020  ? Acute pancreatitis 08/22/2020  ? B12 deficiency 06/08/2020  ? Gynecomastia, male 06/07/2020  ? Erectile dysfunction 06/07/2020  ? Hand cramp 06/07/2020  ? Aortic atherosclerosis ([place]) 06/07/2020  ? Anemia due to acquired thiamine deficiency 03/25/2020  ? Umbilical hernia without obstruction and without gangrene 03/18/2020  ? 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 Deficiency anemia 03/18/2020  ? Hyperlipidemia LDL goal <130 03/18/2020  ? Upper back pain on left side 02/06/2020  ? History of TB (tuberculosis) 10/30/2019  ? Sinusitis 10/16/2019  ? Allergic rhinitis 10/16/2019  ? CAD (coronary artery disease) 09/07/2019  ? Nephrosclerosis 09/07/2019  ? Obesity (BMI 30-39.9) 09/07/2019  ? Secondary hyperparathyroidism ([place]) 09/07/2019  ? CKD (chronic kidney disease), stage V ([place]) 06/22/2019  ? 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 Ulnar neuritis, right 04/25/2019  ? Muscle cramps 02/26/2019  ? Abnormal TSH 02/26/2019  ? Vitamin D deficiency 02/22/2019  ? Iron deficiency 02/22/2019  ? Chest pain 11/01/2018  ? Laryngopharyngeal reflux (LPR) 10/04/2018  ? Post-nasal drainage 10/04/2018  ? Whiplash injuries, initial encounter 09/21/2018  ? Degenerative arthritis  of right knee 09/21/2018  ? Chronic diastolic HF (heart failure) ([place]) 07/04/2018  ? Intractable post-traumatic headache 06/16/2018  ? Low back pain 06/15/2018  ? Neck pain 06/15/2018  ? 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 Thyroid nodule 01/22/2017  ? Ptosis 01/09/2016  ? Chronic systolic heart failure ([place]) 08/01/2015  ? GERD (gastroesophageal reflux disease) 07/29/2015  ? Osteoarthritis 05/03/2015  ? Hyperlipidemia 02/26/2015  ? Diastolic dysfunction 67/67/2094  ? Tobacco abuse 02/26/2015  ? OSA (obstructive sleep apnea) 12/27/2014  ? Encounter for well adult exam with abnormal findings 11/12/2014  ? Anemia in chronic kidney disease 11/12/2014  ? Left knee pain 09/22/2014  ? Paresthesia of left leg 09/21/2014  ? Acute systolic CHF (congestive heart failure) ([place]) 11/17/2013  ? Morbid obesity ([place]) 11/17/2013  ? Hyperglycemia 12/12/2011  ? Acute on chronic renal failure ([place]) 12/11/2011  ? HTN (hypertension) 12/11/2011  ? SOB (shortness of breath) 12/11/2011  ? Gout 12/31/2006  ? HERNIATED LUMBAR DISK WITH RADICULOPATHY 12/30/2006  ? ?Conditions to be addressed/monitored:  CHF and ESRD ? ?Care Plan : RN Care Manager Plan of Care  ?Updates made by Knox Royalty, RN since 07/29/2021 12:00 AM  ?  ? ?Problem: Chronic Disease Management Needs   ?Priority: Medium  ?  ? ?Long-Range Goal: Development of plan of  care for long term chronic disease management   ?Start Date: 12/19/2020  ?Expected End Date: 12/19/2021  ?Priority: Medium  ?Note:   ?Current Barriers:  ?Chronic Disease Management support and education needs related to CHF and ESRD ?New to hemodialysis; actively pursuing kidney transplant through [facility] ?07/29/21: multiple recent ED visits for headache of unknown cause: work up in progress ? ?RNCM Clinical Goal(s):  ?Patient will demonstrate ongoing health management independence ESRD- on hemodialysis/ CHF  through collaboration with RN Care manager, provider, and care team.   ? ?Interventions: ?1:1 collaboration with primary care provider regarding development and update of comprehensive plan of care as evidenced by provider attestation and co-signature ?Inter-disciplinary care team collabo

## 2021-07-30 DIAGNOSIS — N186 End stage renal disease: Secondary | ICD-10-CM | POA: Diagnosis not present

## 2021-07-30 DIAGNOSIS — Z992 Dependence on renal dialysis: Secondary | ICD-10-CM | POA: Diagnosis not present

## 2021-07-30 DIAGNOSIS — N2581 Secondary hyperparathyroidism of renal origin: Secondary | ICD-10-CM | POA: Diagnosis not present

## 2021-08-01 DIAGNOSIS — N2581 Secondary hyperparathyroidism of renal origin: Secondary | ICD-10-CM | POA: Diagnosis not present

## 2021-08-01 DIAGNOSIS — Z992 Dependence on renal dialysis: Secondary | ICD-10-CM | POA: Diagnosis not present

## 2021-08-01 DIAGNOSIS — N186 End stage renal disease: Secondary | ICD-10-CM | POA: Diagnosis not present

## 2021-08-04 DIAGNOSIS — N2581 Secondary hyperparathyroidism of renal origin: Secondary | ICD-10-CM | POA: Diagnosis not present

## 2021-08-04 DIAGNOSIS — N186 End stage renal disease: Secondary | ICD-10-CM | POA: Diagnosis not present

## 2021-08-04 DIAGNOSIS — Z992 Dependence on renal dialysis: Secondary | ICD-10-CM | POA: Diagnosis not present

## 2021-08-06 DIAGNOSIS — N2581 Secondary hyperparathyroidism of renal origin: Secondary | ICD-10-CM | POA: Diagnosis not present

## 2021-08-06 DIAGNOSIS — N186 End stage renal disease: Secondary | ICD-10-CM | POA: Diagnosis not present

## 2021-08-06 DIAGNOSIS — Z992 Dependence on renal dialysis: Secondary | ICD-10-CM | POA: Diagnosis not present

## 2021-08-08 DIAGNOSIS — Z992 Dependence on renal dialysis: Secondary | ICD-10-CM | POA: Diagnosis not present

## 2021-08-08 DIAGNOSIS — N186 End stage renal disease: Secondary | ICD-10-CM | POA: Diagnosis not present

## 2021-08-08 DIAGNOSIS — N2581 Secondary hyperparathyroidism of renal origin: Secondary | ICD-10-CM | POA: Diagnosis not present

## 2021-08-11 DIAGNOSIS — N186 End stage renal disease: Secondary | ICD-10-CM | POA: Diagnosis not present

## 2021-08-11 DIAGNOSIS — N2581 Secondary hyperparathyroidism of renal origin: Secondary | ICD-10-CM | POA: Diagnosis not present

## 2021-08-11 DIAGNOSIS — Z992 Dependence on renal dialysis: Secondary | ICD-10-CM | POA: Diagnosis not present

## 2021-08-13 DIAGNOSIS — N186 End stage renal disease: Secondary | ICD-10-CM | POA: Diagnosis not present

## 2021-08-13 DIAGNOSIS — N2581 Secondary hyperparathyroidism of renal origin: Secondary | ICD-10-CM | POA: Diagnosis not present

## 2021-08-13 DIAGNOSIS — Z992 Dependence on renal dialysis: Secondary | ICD-10-CM | POA: Diagnosis not present

## 2021-08-15 DIAGNOSIS — N2581 Secondary hyperparathyroidism of renal origin: Secondary | ICD-10-CM | POA: Diagnosis not present

## 2021-08-15 DIAGNOSIS — Z992 Dependence on renal dialysis: Secondary | ICD-10-CM | POA: Diagnosis not present

## 2021-08-15 DIAGNOSIS — N186 End stage renal disease: Secondary | ICD-10-CM | POA: Diagnosis not present

## 2021-08-18 DIAGNOSIS — Z992 Dependence on renal dialysis: Secondary | ICD-10-CM | POA: Diagnosis not present

## 2021-08-18 DIAGNOSIS — N2581 Secondary hyperparathyroidism of renal origin: Secondary | ICD-10-CM | POA: Diagnosis not present

## 2021-08-18 DIAGNOSIS — N186 End stage renal disease: Secondary | ICD-10-CM | POA: Diagnosis not present

## 2021-08-20 DIAGNOSIS — N2581 Secondary hyperparathyroidism of renal origin: Secondary | ICD-10-CM | POA: Diagnosis not present

## 2021-08-20 DIAGNOSIS — N185 Chronic kidney disease, stage 5: Secondary | ICD-10-CM | POA: Diagnosis not present

## 2021-08-20 DIAGNOSIS — I501 Left ventricular failure: Secondary | ICD-10-CM

## 2021-08-20 DIAGNOSIS — I129 Hypertensive chronic kidney disease with stage 1 through stage 4 chronic kidney disease, or unspecified chronic kidney disease: Secondary | ICD-10-CM | POA: Diagnosis not present

## 2021-08-20 DIAGNOSIS — F17210 Nicotine dependence, cigarettes, uncomplicated: Secondary | ICD-10-CM

## 2021-08-20 DIAGNOSIS — Z992 Dependence on renal dialysis: Secondary | ICD-10-CM | POA: Diagnosis not present

## 2021-08-20 DIAGNOSIS — I509 Heart failure, unspecified: Secondary | ICD-10-CM

## 2021-08-20 DIAGNOSIS — N186 End stage renal disease: Secondary | ICD-10-CM | POA: Diagnosis not present

## 2021-08-22 DIAGNOSIS — Z992 Dependence on renal dialysis: Secondary | ICD-10-CM | POA: Diagnosis not present

## 2021-08-22 DIAGNOSIS — N2581 Secondary hyperparathyroidism of renal origin: Secondary | ICD-10-CM | POA: Diagnosis not present

## 2021-08-22 DIAGNOSIS — N186 End stage renal disease: Secondary | ICD-10-CM | POA: Diagnosis not present

## 2021-08-24 ENCOUNTER — Emergency Department (HOSPITAL_COMMUNITY)
Admission: EM | Admit: 2021-08-24 | Discharge: 2021-08-24 | Disposition: A | Discharge status: Home / Self Care | Payer: Medicare HMO | Attending: Emergency Medicine | Admitting: Emergency Medicine

## 2021-08-24 ENCOUNTER — Encounter (HOSPITAL_COMMUNITY): Payer: Self-pay

## 2021-08-24 ENCOUNTER — Other Ambulatory Visit: Payer: Self-pay

## 2021-08-24 ENCOUNTER — Emergency Department (HOSPITAL_COMMUNITY): Payer: Medicare HMO

## 2021-08-24 DIAGNOSIS — I119 Hypertensive heart disease without heart failure: Secondary | ICD-10-CM | POA: Diagnosis not present

## 2021-08-24 DIAGNOSIS — G43009 Migraine without aura, not intractable, without status migrainosus: Secondary | ICD-10-CM | POA: Diagnosis not present

## 2021-08-24 DIAGNOSIS — E1122 Type 2 diabetes mellitus with diabetic chronic kidney disease: Secondary | ICD-10-CM | POA: Diagnosis not present

## 2021-08-24 DIAGNOSIS — Z7982 Long term (current) use of aspirin: Secondary | ICD-10-CM | POA: Insufficient documentation

## 2021-08-24 DIAGNOSIS — I509 Heart failure, unspecified: Secondary | ICD-10-CM | POA: Insufficient documentation

## 2021-08-24 DIAGNOSIS — I10 Essential (primary) hypertension: Secondary | ICD-10-CM | POA: Diagnosis not present

## 2021-08-24 DIAGNOSIS — N186 End stage renal disease: Secondary | ICD-10-CM | POA: Diagnosis not present

## 2021-08-24 DIAGNOSIS — I672 Cerebral atherosclerosis: Secondary | ICD-10-CM | POA: Diagnosis not present

## 2021-08-24 DIAGNOSIS — Z992 Dependence on renal dialysis: Secondary | ICD-10-CM | POA: Diagnosis not present

## 2021-08-24 DIAGNOSIS — R519 Headache, unspecified: Secondary | ICD-10-CM | POA: Diagnosis present

## 2021-08-24 DIAGNOSIS — M8448XA Pathological fracture, other site, initial encounter for fracture: Secondary | ICD-10-CM | POA: Diagnosis not present

## 2021-08-24 DIAGNOSIS — G238 Other specified degenerative diseases of basal ganglia: Secondary | ICD-10-CM | POA: Diagnosis not present

## 2021-08-24 DIAGNOSIS — R9082 White matter disease, unspecified: Secondary | ICD-10-CM | POA: Diagnosis not present

## 2021-08-24 LAB — CBC WITH DIFFERENTIAL/PLATELET
Abs Immature Granulocytes: 0.01 10*3/uL (ref 0.00–0.07)
Basophils Absolute: 0 10*3/uL (ref 0.0–0.1)
Basophils Relative: 0 %
Eosinophils Absolute: 0.2 10*3/uL (ref 0.0–0.5)
Eosinophils Relative: 4 %
HCT: 36.8 % — ABNORMAL LOW (ref 39.0–52.0)
Hemoglobin: 12 g/dL — ABNORMAL LOW (ref 13.0–17.0)
Immature Granulocytes: 0 %
Lymphocytes Relative: 40 %
Lymphs Abs: 2.1 10*3/uL (ref 0.7–4.0)
MCH: 29.3 pg (ref 26.0–34.0)
MCHC: 32.6 g/dL (ref 30.0–36.0)
MCV: 89.8 fL (ref 80.0–100.0)
Monocytes Absolute: 0.4 10*3/uL (ref 0.1–1.0)
Monocytes Relative: 8 %
Neutro Abs: 2.6 10*3/uL (ref 1.7–7.7)
Neutrophils Relative %: 48 %
Platelets: 150 10*3/uL (ref 150–400)
RBC: 4.1 MIL/uL — ABNORMAL LOW (ref 4.22–5.81)
RDW: 14.6 % (ref 11.5–15.5)
WBC: 5.3 10*3/uL (ref 4.0–10.5)
nRBC: 0 % (ref 0.0–0.2)

## 2021-08-24 LAB — BASIC METABOLIC PANEL
Anion gap: 14 (ref 5–15)
BUN: 33 mg/dL — ABNORMAL HIGH (ref 6–20)
CO2: 27 mmol/L (ref 22–32)
Calcium: 9.6 mg/dL (ref 8.9–10.3)
Chloride: 88 mmol/L — ABNORMAL LOW (ref 98–111)
Creatinine, Ser: 10.01 mg/dL — ABNORMAL HIGH (ref 0.61–1.24)
GFR, Estimated: 5 mL/min — ABNORMAL LOW (ref >60)
Glucose, Bld: 90 mg/dL (ref 70–99)
Potassium: 4.2 mmol/L (ref 3.5–5.1)
Sodium: 129 mmol/L — ABNORMAL LOW (ref 135–145)

## 2021-08-24 MED ORDER — DIPHENHYDRAMINE HCL 50 MG/ML IJ SOLN
12.5000 mg | Freq: Once | INTRAMUSCULAR | Status: AC
  Administered 2021-08-24: 12.5 mg via INTRAVENOUS
  Filled 2021-08-24: qty 1

## 2021-08-24 MED ORDER — PROCHLORPERAZINE EDISYLATE 10 MG/2ML IJ SOLN
10.0000 mg | Freq: Once | INTRAMUSCULAR | Status: AC
  Administered 2021-08-24: 10 mg via INTRAVENOUS
  Filled 2021-08-24: qty 2

## 2021-08-24 NOTE — Discharge Instructions (Signed)
Follow up with your neurologist.

## 2021-08-24 NOTE — ED Provider Notes (Signed)
[facility] DEPT Provider Note   CSN: [id number] Arrival date & time: 08/24/21  3704     History  Chief Complaint  Patient presents with   Migraine    Craig Flynn is a 61 y.o. male.  Is here with headache.  History of end-stage renal disease on hemodialysis.  Last dialysis Friday.  Here with ongoing headache.  Worsening headaches the last several months.  Has seen neurology for this.  Took Tylenol this morning but did not seem to help much.  History of hypertension, heart failure, CKD, pancreatitis.  Denies any visual problems.  No eye pain.  No current dizziness, chest pain, shortness of breath, abdominal pain.  Nothing makes it worse or better.  No nausea or vomiting.  The history is provided by the patient.      Home Medications Prior to Admission medications   Medication Sig Start Date End Date Taking? Authorizing Provider  acetaminophen (TYLENOL) 500 MG tablet Take 1,000 mg by mouth every 6 (six) hours as needed for moderate pain.    [provider]  albuterol (VENTOLIN HFA) 108 (90 Base) MCG/ACT inhaler INHALE 2 PUFFS INTO THE LUNGS EVERY 6 HOURS AS NEEDED FOR WHEEZING OR SHORTNESS OF BREATH Patient taking differently: Inhale 2 puffs into the lungs every 6 (six) hours as needed for wheezing or shortness of breath. 08/01/20   Biagio Borg, MD  aspirin EC 81 MG tablet Take 81 mg by mouth daily.    [provider]  carvedilol (COREG) 25 MG tablet TAKE 1 TABLET(25 MG) BY MOUTH TWICE DAILY 12/05/20   Biagio Borg, MD  diazepam (VALIUM) 5 MG tablet 1 tab by mouth 45 min prior to MRI 05/23/21   Biagio Borg, MD  fenofibrate 54 MG tablet TAKE 1 TABLET BY MOUTH EVERY DAY 08/07/20   Minus Breeding, MD  iron sucrose in sodium chloride 0.9 % 100 mL Iron Sucrose (Venofer) 09/25/20 09/17/21  [provider]  isosorbide mononitrate (IMDUR) 30 MG 24 hr tablet TAKE 2 TABLETS BY MOUTH DAILY. SCHEDULE OFFICE VISIT FOR FUTURE REFILLS 02/05/21    Minus Breeding, MD  ketoconazole (NIZORAL) 2 % cream Apply 1 application topically daily. 02/11/21   Lorenda Peck, DPM  latanoprost (XALATAN) 0.005 % ophthalmic solution Place 1 drop into the left eye at bedtime. 05/08/21   [provider]  lidocaine-prilocaine (EMLA) cream Apply 1 application topically 3 (three) times a week. MWF 11/14/20   [provider]  nitroGLYCERIN (NITROSTAT) 0.4 MG SL tablet DISSOLVE 1 TABLET UNDER THE TONGUE EVERY 5 MINUTES AS NEEDED FOR CHEST PAIN Patient taking differently: Place 0.4 mg under the tongue every 5 (five) minutes as needed for chest pain. 03/29/19   Tami Lin, MD  oxyCODONE-acetaminophen (PERCOCET/ROXICET) 5-325 MG tablet Take 1 tablet by mouth every 6 (six) hours as needed for severe pain. Patient not taking: Reported on 07/15/2021 05/09/21   Loeffler, Adora Fridge, PA-C  pantoprazole (PROTONIX) 40 MG tablet Take 1 tablet (40 mg total) by mouth 2 (two) times daily before a meal. Patient not taking: Reported on 05/13/2021 01/17/21   Biagio Borg, MD  Potassium Chloride ER 20 MEQ TBCR Take 1 tablet by mouth daily. Patient not taking: Reported on 07/15/2021 11/26/20   [provider]  thiamine (VITAMIN B-1) 50 MG tablet Take 1 tablet (50 mg total) by mouth daily. Patient not taking: Reported on 05/07/2021 03/25/20   Janith Lima, MD  tiZANidine (ZANAFLEX) 2 MG tablet TAKE  1 TABLET(2 MG) BY MOUTH DAILY Patient not taking: Reported on 05/07/2021 04/10/21   Maximiano Coss, NP  vitamin B-12 (CYANOCOBALAMIN) 1000 MCG tablet Take 1,000 mcg by mouth daily.    [provider]      Allergies    Patient has no known allergies.    Review of Systems   Review of Systems  Physical Exam Updated Vital Signs BP (!) 148/65 Comment: Simultaneous filing. User may not have seen previous data.  Pulse 64 Comment: Simultaneous filing. User may not have seen previous data.  Temp 98.1 F (36.7 C) (Oral)   Resp 16 Comment:  Simultaneous filing. User may not have seen previous data.  Ht '6\' 2"'$  (1.88 m)   Wt 118.8 kg   SpO2 99% Comment: Simultaneous filing. User may not have seen previous data.  BMI 33.64 kg/m  Physical Exam Vitals and nursing note reviewed.  Constitutional:      General: He is not in acute distress.    Appearance: He is well-developed.  HENT:     Head: Normocephalic and atraumatic.  Eyes:     Extraocular Movements: Extraocular movements intact.     Conjunctiva/sclera: Conjunctivae normal.     Pupils: Pupils are equal, round, and reactive to light.  Cardiovascular:     Rate and Rhythm: Normal rate and regular rhythm.     Heart sounds: No murmur heard. Pulmonary:     Effort: Pulmonary effort is normal. No respiratory distress.     Breath sounds: Normal breath sounds.  Abdominal:     Palpations: Abdomen is soft.     Tenderness: There is no abdominal tenderness.  Musculoskeletal:        General: No swelling.     Cervical back: Neck supple.  Skin:    General: Skin is warm and dry.     Capillary Refill: Capillary refill takes less than 2 seconds.  Neurological:     General: No focal deficit present.     Mental Status: He is alert and oriented to person, place, and time.     Cranial Nerves: No cranial nerve deficit.     Sensory: No sensory deficit.     Motor: No weakness.     Coordination: Coordination normal.     Comments: 5+ out of 5 strength throughout, normal sensation, no drift, normal finger-nose-finger, normal speech  Psychiatric:        Mood and Affect: Mood normal.    ED Results / Procedures / Treatments   Labs (all labs ordered are listed, but only abnormal results are displayed) Labs Reviewed  CBC WITH DIFFERENTIAL/PLATELET - Abnormal; Notable for the following components:      Result Value   RBC 4.10 (*)    Hemoglobin 12.0 (*)    HCT 36.8 (*)    All other components within normal limits  BASIC METABOLIC PANEL - Abnormal; Notable for the following components:    Sodium 129 (*)    Chloride 88 (*)    BUN 33 (*)    Creatinine, Ser 10.01 (*)    GFR, Estimated 5 (*)    All other components within normal limits    EKG None  Radiology CT HEAD WO CONTRAST (5MM)  Result Date: 08/24/2021 CLINICAL DATA:  61 year old male with new or increasing headache. EXAM: CT HEAD WITHOUT CONTRAST TECHNIQUE: Contiguous axial images were obtained from the base of the skull through the vertex without intravenous contrast. RADIATION DOSE REDUCTION: This exam was performed according to the departmental dose-optimization program which  includes automated exposure control, adjustment of the mA and/or kV according to patient size and/or use of iterative reconstruction technique. COMPARISON:  Head CT 06/30/2021. Brain MRI 03/28/2005. FINDINGS: Brain: No midline shift, ventriculomegaly, mass effect, evidence of mass lesion, intracranial hemorrhage or evidence of cortically based acute infarction. Moderate patchy bilateral cerebral white matter hypodensity appears stable since April. Otherwise preserved gray-white matter differentiation. Vascular: Extensive Calcified atherosclerosis at the skull base. Some calcified left MCA M1 atherosclerosis. Asymmetric right basal ganglia vascular calcifications are stable. No suspicious intracranial vascular hyperdensity. Skull: No acute osseous abnormality identified. Subtle chronic left lamina papyracea fracture. Sinuses/Orbits: Mild chronic left lamina papyracea fracture. Visualized paranasal sinuses and mastoids are stable and well aerated. Other: Visualized orbits and scalp soft tissues are within normal limits. IMPRESSION: 1. No acute intracranial abnormality. 2. Extensive intracranial atherosclerosis. And moderate for age cerebral white matter changes, most commonly due to chronic small vessel disease. Electronically Signed   By: Genevie Ann M.D.   On: 08/24/2021 08:02    Procedures Procedures    Medications Ordered in ED Medications   prochlorperazine (COMPAZINE) injection 10 mg (10 mg Intravenous Given 08/24/21 0758)  diphenhydrAMINE (BENADRYL) injection 12.5 mg (12.5 mg Intravenous Given 08/24/21 0759)    ED Course/ Medical Decision Making/ A&P                           Medical Decision Making Amount and/or Complexity of Data Reviewed Labs: ordered. Radiology: ordered.  Risk Prescription drug management.   [name] is here with headache.  Unremarkable vitals.  Comorbidities include chronic kidney disease on hemodialysis, heart failure, hypertension.  Here with migraine type headache.  Differential diagnosis includes migraine versus mass effect/head bleed versus electrolyte abnormality.  I have no concern for stroke or glaucoma or other acute process at this time.  He has been having headaches now for the last several months.  Has followed with neurology for this.  He has had negative imaging in the past.  Neurologically appears to be intact.  He does not seem very uncomfortable on exam.  Took Tylenol earlier this morning.  We will get labs to evaluate for anemia, electrolyte abnormality, head CT to further evaluate for any change.  Will give headache cocktail with IV Compazine and Benadryl and reevaluate.  Per my review and interpretation of labs no significant anemia, electrolyte abnormality.  Creatinine at baseline.  CT scan of the head is unremarkable.  Feeling better after headache cocktail.  Recommend follow-up with neurologist.  Overall suspect migraine.  Discharged in good condition.  This chart was dictated using voice recognition software.  Despite best efforts to proofread,  errors can occur which can change the documentation meaning.         Final Clinical Impression(s) / ED Diagnoses Final diagnoses:  Migraine without aura and without status migrainosus, not intractable    Rx / DC Orders ED Discharge Orders     None         Lennice Sites, DO 08/24/21 [phone]

## 2021-08-24 NOTE — ED Triage Notes (Signed)
Patient said he has had a migraine for 2 months now. Started tonight on the right side and has moved all across the head. No change in balance but said his vision has gotten a little blurrier than normal. Is a dialysis patient, goes [facility] and said he is compliant.

## 2021-08-25 DIAGNOSIS — N186 End stage renal disease: Secondary | ICD-10-CM | POA: Diagnosis not present

## 2021-08-25 DIAGNOSIS — N2581 Secondary hyperparathyroidism of renal origin: Secondary | ICD-10-CM | POA: Diagnosis not present

## 2021-08-25 DIAGNOSIS — Z992 Dependence on renal dialysis: Secondary | ICD-10-CM | POA: Diagnosis not present

## 2021-08-27 DIAGNOSIS — N186 End stage renal disease: Secondary | ICD-10-CM | POA: Diagnosis not present

## 2021-08-27 DIAGNOSIS — N2581 Secondary hyperparathyroidism of renal origin: Secondary | ICD-10-CM | POA: Diagnosis not present

## 2021-08-27 DIAGNOSIS — Z992 Dependence on renal dialysis: Secondary | ICD-10-CM | POA: Diagnosis not present

## 2021-08-29 DIAGNOSIS — N2581 Secondary hyperparathyroidism of renal origin: Secondary | ICD-10-CM | POA: Diagnosis not present

## 2021-08-29 DIAGNOSIS — N186 End stage renal disease: Secondary | ICD-10-CM | POA: Diagnosis not present

## 2021-08-29 DIAGNOSIS — Z992 Dependence on renal dialysis: Secondary | ICD-10-CM | POA: Diagnosis not present

## 2021-09-01 DIAGNOSIS — N2581 Secondary hyperparathyroidism of renal origin: Secondary | ICD-10-CM | POA: Diagnosis not present

## 2021-09-01 DIAGNOSIS — Z992 Dependence on renal dialysis: Secondary | ICD-10-CM | POA: Diagnosis not present

## 2021-09-01 DIAGNOSIS — N186 End stage renal disease: Secondary | ICD-10-CM | POA: Diagnosis not present

## 2021-09-02 ENCOUNTER — Ambulatory Visit: Payer: Medicare HMO | Admitting: Internal Medicine

## 2021-09-03 DIAGNOSIS — Z992 Dependence on renal dialysis: Secondary | ICD-10-CM | POA: Diagnosis not present

## 2021-09-03 DIAGNOSIS — N186 End stage renal disease: Secondary | ICD-10-CM | POA: Diagnosis not present

## 2021-09-03 DIAGNOSIS — N2581 Secondary hyperparathyroidism of renal origin: Secondary | ICD-10-CM | POA: Diagnosis not present

## 2021-09-05 DIAGNOSIS — N2581 Secondary hyperparathyroidism of renal origin: Secondary | ICD-10-CM | POA: Diagnosis not present

## 2021-09-05 DIAGNOSIS — N186 End stage renal disease: Secondary | ICD-10-CM | POA: Diagnosis not present

## 2021-09-05 DIAGNOSIS — Z992 Dependence on renal dialysis: Secondary | ICD-10-CM | POA: Diagnosis not present

## 2021-09-08 DIAGNOSIS — N186 End stage renal disease: Secondary | ICD-10-CM | POA: Diagnosis not present

## 2021-09-08 DIAGNOSIS — N2581 Secondary hyperparathyroidism of renal origin: Secondary | ICD-10-CM | POA: Diagnosis not present

## 2021-09-08 DIAGNOSIS — Z992 Dependence on renal dialysis: Secondary | ICD-10-CM | POA: Diagnosis not present

## 2021-09-10 DIAGNOSIS — Z992 Dependence on renal dialysis: Secondary | ICD-10-CM | POA: Diagnosis not present

## 2021-09-10 DIAGNOSIS — N2581 Secondary hyperparathyroidism of renal origin: Secondary | ICD-10-CM | POA: Diagnosis not present

## 2021-09-10 DIAGNOSIS — N186 End stage renal disease: Secondary | ICD-10-CM | POA: Diagnosis not present

## 2021-09-12 DIAGNOSIS — N186 End stage renal disease: Secondary | ICD-10-CM | POA: Diagnosis not present

## 2021-09-12 DIAGNOSIS — Z992 Dependence on renal dialysis: Secondary | ICD-10-CM | POA: Diagnosis not present

## 2021-09-12 DIAGNOSIS — N2581 Secondary hyperparathyroidism of renal origin: Secondary | ICD-10-CM | POA: Diagnosis not present

## 2021-09-15 DIAGNOSIS — Z992 Dependence on renal dialysis: Secondary | ICD-10-CM | POA: Diagnosis not present

## 2021-09-15 DIAGNOSIS — N2581 Secondary hyperparathyroidism of renal origin: Secondary | ICD-10-CM | POA: Diagnosis not present

## 2021-09-15 DIAGNOSIS — N186 End stage renal disease: Secondary | ICD-10-CM | POA: Diagnosis not present

## 2021-09-17 ENCOUNTER — Ambulatory Visit (INDEPENDENT_AMBULATORY_CARE_PROVIDER_SITE_OTHER): Payer: Medicare HMO | Admitting: Internal Medicine

## 2021-09-17 ENCOUNTER — Encounter: Payer: Self-pay | Admitting: Internal Medicine

## 2021-09-17 VITALS — BP 136/70 | HR 47 | Temp 98.3°F | Ht 74.0 in | Wt 259.0 lb

## 2021-09-17 DIAGNOSIS — N2581 Secondary hyperparathyroidism of renal origin: Secondary | ICD-10-CM | POA: Diagnosis not present

## 2021-09-17 DIAGNOSIS — R42 Dizziness and giddiness: Secondary | ICD-10-CM

## 2021-09-17 DIAGNOSIS — E559 Vitamin D deficiency, unspecified: Secondary | ICD-10-CM

## 2021-09-17 DIAGNOSIS — G629 Polyneuropathy, unspecified: Secondary | ICD-10-CM

## 2021-09-17 DIAGNOSIS — H9311 Tinnitus, right ear: Secondary | ICD-10-CM | POA: Diagnosis not present

## 2021-09-17 DIAGNOSIS — N186 End stage renal disease: Secondary | ICD-10-CM | POA: Diagnosis not present

## 2021-09-17 DIAGNOSIS — Z720 Tobacco use: Secondary | ICD-10-CM

## 2021-09-17 DIAGNOSIS — Z992 Dependence on renal dialysis: Secondary | ICD-10-CM | POA: Diagnosis not present

## 2021-09-17 MED ORDER — GABAPENTIN 300 MG PO CAPS: 300.0000 mg | ORAL_CAPSULE | Freq: Every day | ORAL | 1 refills | Status: DC

## 2021-09-17 NOTE — Patient Instructions (Signed)
Please take all new medication as prescribed - the gabapentin to help with Headache as well as the toe numbness at bedtime  Please continue all other medications as before, and refills have been done if requested.  Please have the pharmacy call with any other refills you may need.  Please keep your appointments with your specialists as you may have planned  You will be contacted regarding the referral for: ENT  Please make an Appointment to return in 6 months, or sooner if needed

## 2021-09-17 NOTE — Progress Notes (Signed)
Patient ID: Craig Flynn, male   DOB: 07-06-1960, 61 y.o.   MRN: [id number]        Chief Complaint: follow up dizizness, tinnittus, and neuropathy       HPI:  Craig Flynn is a 61 y.o. male here with 1 wk worsening tinnitus more to the right ear with HA without fever, chills, ST or cough.  Pt denies chest pain, increased sob or doe, wheezing, orthopnea, PND, increased LE swelling, palpitations, dizziness or syncope.  Dizziness improved with increased dry wt recently per renal at HD after seeing neurology.  Also has worsening neuritic pain to the legs worse at night, hard to get to sleep.         Wt Readings from Last 3 Encounters:  09/17/21 259 lb (117.5 kg)  08/24/21 262 lb (118.8 kg)  07/15/21 262 lb (118.8 kg)   BP Readings from Last 3 Encounters:  09/17/21 136/70  08/24/21 (!) 134/96  07/15/21 125/78         Past Medical History:  Diagnosis Date   Anemia 06/2015   Arthritis    knee   Cancer ([place])    thyroid cancer   Cardiomyopathy ([place])    a. 10/2013: EF reduced to 35-40% b. 09/2014: EF improved to 55-60%, Grade 1 DD noted.   Chest pain 06/2015   CHF (congestive heart failure) (HCC)    CKD (chronic kidney disease), stage IV [facility])    sees  Dr Lorrene Reid   Dyspnea    with exerion    GERD (gastroesophageal reflux disease)    Glaucoma    Gout    Hypertension    Obesity    Pancreatitis    Sleep apnea    not wearing c-pap now, needs new slep study per pt. (01/22/2017)   Tuberculosis 1981   while the pt was in the service.   Tubular adenoma of colon 10/2015   Past Surgical History:  Procedure Laterality Date   AV FISTULA PLACEMENT Left 03/25/2016   Procedure: Creation of Left Arm ARTERIOVENOUS (AV) FISTULA;  Surgeon: Elam Dutch, MD;  Location: [place];  Service: Vascular;  Laterality: Left;   AV FISTULA PLACEMENT Left 08/27/2020   Procedure: LEFT BASILIC VEIN ARTERIOVENOUS (AV) FISTULA CREATION;  Surgeon: Rosetta Posner, MD;  Location: [place];  Service: Vascular;   Laterality: Left;   [place] Left 10/22/2020   Procedure: LEFT SECOND STAGE BASILIC VEIN TRANSPOSITION;  Surgeon: Elam Dutch, MD;  Location: [place];  Service: Vascular;  Laterality: Left;   COLONOSCOPY W/ BIOPSIES AND POLYPECTOMY     "no problem"   HERNIA REPAIR     IR FLUORO GUIDE CV LINE RIGHT  08/22/2020   IR US GUIDE VASC ACCESS RIGHT  08/22/2020   LAPAROSCOPIC CHOLECYSTECTOMY     LIGATION OF COMPETING BRANCHES OF ARTERIOVENOUS FISTULA Left 01/25/2018   Procedure: LIGATION OF COMPETING BRANCHES And Revision of ARTERIOVENOUS FISTULA LEFT ARM.;  Surgeon: Elam Dutch, MD;  Location: [place];  Service: Vascular;  Laterality: Left;   THYROIDECTOMY  01/22/2017   THYROIDECTOMY Left 01/22/2017   Procedure: THYROIDECTOMY;  Surgeon: Melida Quitter, MD;  Location: [place];  Service: ENT;  Laterality: Left;   THYROIDECTOMY Right 03/26/2017   Procedure: COMPLETION OF THYROIDECTOMY;  Surgeon: Melida Quitter, MD;  Location: [place];  Service: ENT;  Laterality: Right;   UMBILICAL HERNIA REPAIR     WISDOM TOOTH EXTRACTION      reports that he has been smoking cigarettes.  He has never used smokeless tobacco. He reports that he does not currently use alcohol. He reports that he does not use drugs. family history includes Diabetes in his mother; Healthy in his maternal grandfather, paternal grandfather, and paternal grandmother; Hypertension in his maternal grandmother; Kidney disease in his maternal grandmother; Pneumonia in his father; Stroke in his mother. No Known Allergies Current Outpatient Medications on File Prior to Visit  Medication Sig Dispense Refill   acetaminophen (TYLENOL) 500 MG tablet Take 1,000 mg by mouth every 6 (six) hours as needed for moderate pain.     albuterol (VENTOLIN HFA) 108 (90 Base) MCG/ACT inhaler INHALE 2 PUFFS INTO THE LUNGS EVERY 6 HOURS AS NEEDED FOR WHEEZING OR SHORTNESS OF BREATH (Patient taking differently: Inhale 2 puffs into the lungs every 6 (six)  hours as needed for wheezing or shortness of breath.) 18 g 5   aspirin EC 81 MG tablet Take 81 mg by mouth daily.     carvedilol (COREG) 25 MG tablet TAKE 1 TABLET(25 MG) BY MOUTH TWICE DAILY 180 tablet 2   diazepam (VALIUM) 5 MG tablet 1 tab by mouth 45 min prior to MRI 1 tablet 0   fenofibrate 54 MG tablet TAKE 1 TABLET BY MOUTH EVERY DAY 60 tablet 0   isosorbide mononitrate (IMDUR) 30 MG 24 hr tablet TAKE 2 TABLETS BY MOUTH DAILY. SCHEDULE OFFICE VISIT FOR FUTURE REFILLS 180 tablet 3   ketoconazole (NIZORAL) 2 % cream Apply 1 application topically daily. 60 g 2   latanoprost (XALATAN) 0.005 % ophthalmic solution Place 1 drop into the left eye at bedtime.     lidocaine-prilocaine (EMLA) cream Apply 1 application topically 3 (three) times a week. MWF     nitroGLYCERIN (NITROSTAT) 0.4 MG SL tablet DISSOLVE 1 TABLET UNDER THE TONGUE EVERY 5 MINUTES AS NEEDED FOR CHEST PAIN (Patient taking differently: Place 0.4 mg under the tongue every 5 (five) minutes as needed for chest pain.) 25 tablet 3   vitamin B-12 (CYANOCOBALAMIN) 1000 MCG tablet Take 1,000 mcg by mouth daily.     oxyCODONE-acetaminophen (PERCOCET/ROXICET) 5-325 MG tablet Take 1 tablet by mouth every 6 (six) hours as needed for severe pain. (Patient not taking: Reported on 07/15/2021) 15 tablet 0   pantoprazole (PROTONIX) 40 MG tablet Take 1 tablet (40 mg total) by mouth 2 (two) times daily before a meal. (Patient not taking: Reported on 05/13/2021) 180 tablet 3   Potassium Chloride ER 20 MEQ TBCR Take 1 tablet by mouth daily. (Patient not taking: Reported on 07/15/2021)     thiamine (VITAMIN B-1) 50 MG tablet Take 1 tablet (50 mg total) by mouth daily. (Patient not taking: Reported on 05/07/2021) 90 tablet 1   tiZANidine (ZANAFLEX) 2 MG tablet TAKE 1 TABLET(2 MG) BY MOUTH DAILY (Patient not taking: Reported on 05/07/2021) 30 tablet 0   No current facility-administered medications on file prior to visit.        ROS:  All others reviewed and  negative.  Objective        PE:  BP 136/70 (BP Location: Right Arm, Patient Position: Sitting, Cuff Size: Large)   Pulse (!) 47   Temp 98.3 F (36.8 C) (Oral)   Ht '6\' 2"'$  (1.88 m)   Wt 259 lb (117.5 kg)   SpO2 100%   BMI 33.25 kg/m                 Constitutional: Pt appears in NAD  HENT: Head: NCAT.                Right Ear: External ear normal.                 Left Ear: External ear normal.                Eyes: . Pupils are equal, round, and reactive to light. Conjunctivae and EOM are normal               Nose: without d/c or deformity               Neck: Neck supple. Gross normal ROM               Cardiovascular: Normal rate and regular rhythm.                 Pulmonary/Chest: Effort normal and breath sounds without rales or wheezing.                Abd:  Soft, NT, ND, + BS, no organomegaly               Neurological: Pt is alert. At baseline orientation, motor grossly intact               Skin: Skin is warm. No rashes, no other new lesions, LE edema - none               Psychiatric: Pt behavior is normal without agitation   Micro: none  Cardiac tracings I have personally interpreted today:  none  Pertinent Radiological findings (summarize): none   Lab Results  Component Value Date   WBC 5.3 08/24/2021   HGB 12.0 (L) 08/24/2021   HCT 36.8 (L) 08/24/2021   PLT 150 08/24/2021   GLUCOSE 90 08/24/2021   CHOL 132 06/07/2020   TRIG 162 (H) 08/21/2020   HDL 30.40 (L) 06/07/2020   LDLCALC 72 06/07/2020   ALT 9 05/09/2021   AST 10 (L) 05/09/2021   NA 129 (L) 08/24/2021   K 4.2 08/24/2021   CL 88 (L) 08/24/2021   CREATININE 10.01 (H) 08/24/2021   BUN 33 (H) 08/24/2021   CO2 27 08/24/2021   TSH 0.50 06/07/2020   PSA 0.67 06/07/2020   INR 1.1 05/09/2021   HGBA1C 5.5 06/07/2020   Assessment/Plan:  EMRY BARBATO is a 61 y.o. Black or African American [2] male with  has a past medical history of Anemia (06/2015), Arthritis, Cancer ([place]), Cardiomyopathy ([place]),  Chest pain (06/2015), CHF (congestive heart failure) ([place]), CKD (chronic kidney disease), stage IV ([place]), Dyspnea, GERD (gastroesophageal reflux disease), Glaucoma, Gout, Hypertension, Obesity, Pancreatitis, Sleep apnea, Tuberculosis (1981), and Tubular adenoma of colon (10/2015).  Dizziness Improved with increased dry wt at HD,  to f/u any worsening symptoms or concerns  Tinnitus of right ear Recent worsening, etiology unclear, for ENT referral  Neuropathy Recent worsening, uncontrolled, for gabapentin 300 mg qhs   Tobacco abuse Pt counseled to quit smoking, pt not ready  Vitamin D deficiency Last vitamin D Lab Results  Component Value Date   VD25OH 35.91 06/07/2020   Low, reminded to start oral replacement  Followup: Return in about 6 months (around 03/19/2022).  Cathlean Cower, MD 09/20/2021 3:51 PM [facility]

## 2021-09-19 DIAGNOSIS — Z992 Dependence on renal dialysis: Secondary | ICD-10-CM | POA: Diagnosis not present

## 2021-09-19 DIAGNOSIS — I129 Hypertensive chronic kidney disease with stage 1 through stage 4 chronic kidney disease, or unspecified chronic kidney disease: Secondary | ICD-10-CM | POA: Diagnosis not present

## 2021-09-19 DIAGNOSIS — N186 End stage renal disease: Secondary | ICD-10-CM | POA: Diagnosis not present

## 2021-09-19 DIAGNOSIS — N2581 Secondary hyperparathyroidism of renal origin: Secondary | ICD-10-CM | POA: Diagnosis not present

## 2021-09-20 ENCOUNTER — Encounter: Payer: Self-pay | Admitting: Internal Medicine

## 2021-09-20 DIAGNOSIS — R42 Dizziness and giddiness: Secondary | ICD-10-CM | POA: Insufficient documentation

## 2021-09-20 DIAGNOSIS — H9311 Tinnitus, right ear: Secondary | ICD-10-CM | POA: Insufficient documentation

## 2021-09-20 DIAGNOSIS — G629 Polyneuropathy, unspecified: Secondary | ICD-10-CM | POA: Insufficient documentation

## 2021-09-20 NOTE — Assessment & Plan Note (Signed)
Improved with increased dry wt at HD,  to f/u any worsening symptoms or concerns

## 2021-09-20 NOTE — Assessment & Plan Note (Signed)
Recent worsening, etiology unclear, for ENT referral

## 2021-09-20 NOTE — Assessment & Plan Note (Signed)
Last vitamin D Lab Results  Component Value Date   VD25OH 35.91 06/07/2020   Low, reminded to start oral replacement  

## 2021-09-20 NOTE — Assessment & Plan Note (Signed)
Pt counseled to quit smoking, pt not ready

## 2021-09-20 NOTE — Assessment & Plan Note (Signed)
Recent worsening, uncontrolled, for gabapentin 300 mg qhs

## 2021-09-22 DIAGNOSIS — N2581 Secondary hyperparathyroidism of renal origin: Secondary | ICD-10-CM | POA: Diagnosis not present

## 2021-09-22 DIAGNOSIS — Z992 Dependence on renal dialysis: Secondary | ICD-10-CM | POA: Diagnosis not present

## 2021-09-22 DIAGNOSIS — N186 End stage renal disease: Secondary | ICD-10-CM | POA: Diagnosis not present

## 2021-09-24 DIAGNOSIS — Z992 Dependence on renal dialysis: Secondary | ICD-10-CM | POA: Diagnosis not present

## 2021-09-24 DIAGNOSIS — N2581 Secondary hyperparathyroidism of renal origin: Secondary | ICD-10-CM | POA: Diagnosis not present

## 2021-09-24 DIAGNOSIS — N186 End stage renal disease: Secondary | ICD-10-CM | POA: Diagnosis not present

## 2021-09-25 ENCOUNTER — Telehealth: Payer: Self-pay | Admitting: Internal Medicine

## 2021-09-26 DIAGNOSIS — N186 End stage renal disease: Secondary | ICD-10-CM | POA: Diagnosis not present

## 2021-09-26 DIAGNOSIS — Z992 Dependence on renal dialysis: Secondary | ICD-10-CM | POA: Diagnosis not present

## 2021-09-26 DIAGNOSIS — N2581 Secondary hyperparathyroidism of renal origin: Secondary | ICD-10-CM | POA: Diagnosis not present

## 2021-09-29 DIAGNOSIS — N186 End stage renal disease: Secondary | ICD-10-CM | POA: Diagnosis not present

## 2021-09-29 DIAGNOSIS — Z992 Dependence on renal dialysis: Secondary | ICD-10-CM | POA: Diagnosis not present

## 2021-09-29 DIAGNOSIS — N2581 Secondary hyperparathyroidism of renal origin: Secondary | ICD-10-CM | POA: Diagnosis not present

## 2021-10-01 DIAGNOSIS — N2581 Secondary hyperparathyroidism of renal origin: Secondary | ICD-10-CM | POA: Diagnosis not present

## 2021-10-01 DIAGNOSIS — N186 End stage renal disease: Secondary | ICD-10-CM | POA: Diagnosis not present

## 2021-10-01 DIAGNOSIS — Z992 Dependence on renal dialysis: Secondary | ICD-10-CM | POA: Diagnosis not present

## 2021-10-03 DIAGNOSIS — Z992 Dependence on renal dialysis: Secondary | ICD-10-CM | POA: Diagnosis not present

## 2021-10-03 DIAGNOSIS — N186 End stage renal disease: Secondary | ICD-10-CM | POA: Diagnosis not present

## 2021-10-03 DIAGNOSIS — N2581 Secondary hyperparathyroidism of renal origin: Secondary | ICD-10-CM | POA: Diagnosis not present

## 2021-10-06 DIAGNOSIS — N2581 Secondary hyperparathyroidism of renal origin: Secondary | ICD-10-CM | POA: Diagnosis not present

## 2021-10-06 DIAGNOSIS — Z992 Dependence on renal dialysis: Secondary | ICD-10-CM | POA: Diagnosis not present

## 2021-10-06 DIAGNOSIS — N186 End stage renal disease: Secondary | ICD-10-CM | POA: Diagnosis not present

## 2021-10-08 DIAGNOSIS — N2581 Secondary hyperparathyroidism of renal origin: Secondary | ICD-10-CM | POA: Diagnosis not present

## 2021-10-08 DIAGNOSIS — N186 End stage renal disease: Secondary | ICD-10-CM | POA: Diagnosis not present

## 2021-10-08 DIAGNOSIS — Z992 Dependence on renal dialysis: Secondary | ICD-10-CM | POA: Diagnosis not present

## 2021-10-10 DIAGNOSIS — N2581 Secondary hyperparathyroidism of renal origin: Secondary | ICD-10-CM | POA: Diagnosis not present

## 2021-10-10 DIAGNOSIS — N186 End stage renal disease: Secondary | ICD-10-CM | POA: Diagnosis not present

## 2021-10-10 DIAGNOSIS — Z992 Dependence on renal dialysis: Secondary | ICD-10-CM | POA: Diagnosis not present

## 2021-10-13 DIAGNOSIS — N2581 Secondary hyperparathyroidism of renal origin: Secondary | ICD-10-CM | POA: Diagnosis not present

## 2021-10-13 DIAGNOSIS — N186 End stage renal disease: Secondary | ICD-10-CM | POA: Diagnosis not present

## 2021-10-13 DIAGNOSIS — Z992 Dependence on renal dialysis: Secondary | ICD-10-CM | POA: Diagnosis not present

## 2021-10-14 ENCOUNTER — Ambulatory Visit: Payer: Medicare HMO | Admitting: *Deleted

## 2021-10-14 DIAGNOSIS — I5032 Chronic diastolic (congestive) heart failure: Secondary | ICD-10-CM

## 2021-10-14 DIAGNOSIS — N185 Chronic kidney disease, stage 5: Secondary | ICD-10-CM

## 2021-10-14 NOTE — Chronic Care Management (AMB) (Signed)
Care Management    RN Visit Note  10/14/2021 Name: Craig Flynn MRN: [id number] DOB: 1961/02/16  Subjective: Craig Flynn is a 61 y.o. year old male who is a primary care patient of Windell, Musson, MD. The care management team was consulted for assistance with disease management and care coordination needs.    Engaged with patient by telephone for follow up visit/ RN CM case closure in response to provider referral for case management and/or care coordination services.   Consent to Services:   [name] was given information about Care Management services 11/28/20 including:  Care Management services includes personalized support from designated clinical staff supervised by his physician, including individualized plan of care and coordination with other care providers 24/7 contact phone numbers for assistance for urgent and routine care needs. The patient may stop case management services at any time by phone call to the office staff.  Patient agreed to services and consent obtained.   Assessment: Review of patient past medical history, allergies, medications, health status, including review of consultants reports, laboratory and other test data, was performed as part of comprehensive evaluation and provision of chronic care management services.   SDOH (Social Determinants of Health) assessments and interventions performed:  SDOH Interventions    Flowsheet Row Most Recent Value  SDOH Interventions   Food Insecurity Interventions Intervention Not Indicated  [continues to deny food insecurity]  Transportation Interventions Intervention Not Indicated  [continues to drive self]     Care Plan  No Known Allergies  Outpatient Encounter Medications as of 10/14/2021  Medication Sig Note   acetaminophen (TYLENOL) 500 MG tablet Take 1,000 mg by mouth every 6 (six) hours as needed for moderate pain.    albuterol (VENTOLIN HFA) 108 (90 Base) MCG/ACT inhaler INHALE 2 PUFFS INTO THE LUNGS  EVERY 6 HOURS AS NEEDED FOR WHEEZING OR SHORTNESS OF BREATH (Patient taking differently: Inhale 2 puffs into the lungs every 6 (six) hours as needed for wheezing or shortness of breath.) 12/19/2020: 12/19/20- reports has not needed recently   aspirin EC 81 MG tablet Take 81 mg by mouth daily.    carvedilol (COREG) 25 MG tablet TAKE 1 TABLET(25 MG) BY MOUTH TWICE DAILY 12/19/2020: 12/19/20-- reports taking around Hemodialysis: does not take in am on mornings he gets HD (M-W-F)   diazepam (VALIUM) 5 MG tablet 1 tab by mouth 45 min prior to MRI    fenofibrate 54 MG tablet TAKE 1 TABLET BY MOUTH EVERY DAY    gabapentin (NEURONTIN) 300 MG capsule Take 1 capsule (300 mg total) by mouth at bedtime.    isosorbide mononitrate (IMDUR) 30 MG 24 hr tablet TAKE 2 TABLETS BY MOUTH DAILY. SCHEDULE OFFICE VISIT FOR FUTURE REFILLS    ketoconazole (NIZORAL) 2 % cream Apply 1 application topically daily.    latanoprost (XALATAN) 0.005 % ophthalmic solution Place 1 drop into the left eye at bedtime.    lidocaine-prilocaine (EMLA) cream Apply 1 application topically 3 (three) times a week. MWF    nitroGLYCERIN (NITROSTAT) 0.4 MG SL tablet DISSOLVE 1 TABLET UNDER THE TONGUE EVERY 5 MINUTES AS NEEDED FOR CHEST PAIN (Patient taking differently: Place 0.4 mg under the tongue every 5 (five) minutes as needed for chest pain.) 12/19/2020: 12/19/20: Reports has never needed to use   oxyCODONE-acetaminophen (PERCOCET/ROXICET) 5-325 MG tablet Take 1 tablet by mouth every 6 (six) hours as needed for severe pain. (Patient not taking: Reported on 07/15/2021)    pantoprazole (PROTONIX) 40 MG tablet  Take 1 tablet (40 mg total) by mouth 2 (two) times daily before a meal. (Patient not taking: Reported on 05/13/2021)    Potassium Chloride ER 20 MEQ TBCR Take 1 tablet by mouth daily. (Patient not taking: Reported on 07/15/2021)    thiamine (VITAMIN B-1) 50 MG tablet Take 1 tablet (50 mg total) by mouth daily. (Patient not taking: Reported on  05/07/2021)    tiZANidine (ZANAFLEX) 2 MG tablet TAKE 1 TABLET(2 MG) BY MOUTH DAILY (Patient not taking: Reported on 05/07/2021)    vitamin B-12 (CYANOCOBALAMIN) 1000 MCG tablet Take 1,000 mcg by mouth daily.    No facility-administered encounter medications on file as of 10/14/2021.   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   Deficiency anemia 03/18/2020   Hyperlipidemia LDL goal <130 03/18/2020   Upper back pain on left side 02/06/2020   History of TB (tuberculosis) 10/30/2019   Sinusitis 10/16/2019   Allergic rhinitis 10/16/2019   CAD (coronary artery disease) 09/07/2019   Nephrosclerosis 09/07/2019   Obesity (BMI 30-39.9) 09/07/2019   Secondary hyperparathyroidism ([place]) 09/07/2019   CKD (chronic kidney disease), stage V ([place]) 06/22/2019   Obesity, diabetes, and hypertension syndrome ([place]) 06/22/2019   Ulnar neuritis, right 04/25/2019   Muscle cramps 02/26/2019   Abnormal TSH 02/26/2019   Vitamin D deficiency 02/22/2019   Iron  deficiency 02/22/2019   Chest pain 11/01/2018   Laryngopharyngeal reflux (LPR) 10/04/2018   Post-nasal drainage 10/04/2018   Whiplash injuries, initial encounter 09/21/2018   Degenerative arthritis of right knee 09/21/2018   Chronic diastolic HF (heart failure) ([place]) 07/04/2018   Intractable post-traumatic headache 06/16/2018   Low back pain 06/15/2018   Neck pain 09/73/5329   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   Thyroid nodule 01/22/2017   Ptosis 92/42/6834   Chronic systolic heart failure ([place]) 08/01/2015   GERD (gastroesophageal reflux disease) 07/29/2015   Osteoarthritis 05/03/2015   Hyperlipidemia 19/62/2297   Diastolic dysfunction 98/92/1194   Tobacco abuse 02/26/2015   OSA (obstructive sleep apnea) 12/27/2014   Encounter for well adult exam with abnormal findings 11/12/2014   Anemia in chronic kidney disease 11/12/2014   Left knee pain 09/22/2014   Paresthesia of left leg 17/40/8144   Acute systolic CHF (congestive heart failure) ([place]) 11/17/2013   Morbid obesity ([place]) 11/17/2013   Hyperglycemia 12/12/2011   Acute on chronic renal failure ([place]) 12/11/2011   HTN (hypertension) 12/11/2011   SOB (shortness of breath) 12/11/2011   Gout 12/31/2006   HERNIATED LUMBAR DISK WITH RADICULOPATHY 12/30/2006   Conditions to be addressed/monitored: CHF and ESRD  Care Plan : RN Care Manager Plan of Care  Updates made  by Knox Royalty, RN since 10/14/2021 12:00 AM     Problem: Chronic Disease Management Needs   Priority: Medium     Long-Range Goal: Development of plan of care for long term chronic disease management   Start Date: 12/19/2020  Expected End Date: 12/19/2021  Priority: Medium  Note:   Current Barriers:  Chronic Disease Management support and education needs related to CHF and ESRD New to hemodialysis; actively pursuing kidney transplant through [facility] 07/29/21: multiple recent ED visits for headache of unknown cause: work up in  progress  [place]):  Patient will demonstrate ongoing health management independence ESRD- on hemodialysis/ CHF  through collaboration with RN Care manager, provider, and care team.   Interventions: 1:1 collaboration with primary care provider regarding development and update of comprehensive plan of care as evidenced by provider attestation and co-signature Inter-disciplinary care team collaboration (see longitudinal plan of care) Evaluation of current treatment plan related to  self management and patient's adherence to plan as established by provider Review of patient status, including review of consultants reports, relevant laboratory and other test results, and medications completed SDOH updated: no new/ unmet concerns identified Pain assessment updated: reports improvement in chronic daily headaches: currently taking gabapentin, states MRI was "clear;" and continues to follow up with neurology provider; denies pain today Falls assessment updated: continues to deny new/ recent falls x 12 months- currently not using assistive devices;  positive reinforcement provided with encouragement to continue efforts at fall prevention; previously provided education around fall risks/ prevention reinforced Reviewed recent PCP office visit 09/17/21: patient verbalizes a very good understanding of post-office visit instructions; denies questions; confirmed he is taking newly prescribed gabapentin as instructed Medications discussed: reports continues to independently self-manage and denies current concerns/ issues/ questions around medications; endorses adherence to taking all medications as prescribed Reviewed upcoming scheduled provider appointments:  12/23/21- neurology provider; confirms he continues being worked up at [facility] for [facility] transplant- confirms he remains on waiting list for same; patient confirms is aware of all upcoming provider office visits and has plans to attend as  scheduled Discussed plans with patient for ongoing care management follow up- patient denies current care coordination/ care management needs and is agreeable to CCM RN CM case closure today; verbalizes understanding to contact PCP or other care providers for any needs that arise in the future, and confirms he has contact information for all care providers     Heart Failure Interventions:  (Status: 10/14/21: Goal Met.) Basic overview and discussion of pathophysiology of Heart Failure reviewed Discussed importance of daily weight and advised patient to weigh and record daily Reviewed role of diuretics in prevention of fluid overload and management of heart failure Discussed the importance of keeping all appointments with provider Confirmed patient continues to follow heart healthy, low salt, renal diet Confirmed patient exercises/ is active- as allowed, around weather conditions; reports he continues to walk for exercise regularly; positive reinforcement provided with encouragement to continue efforts Discussed daily weight management at home: patient weighs self regularly at home, and also weighs weekly tid at hemodialysis sessions; reports general weight ranges at home currently between 250-256 lbs- he denies concerns with weight gain > 3 lbs overnight/ 5 lbs one week; confirms he has good understanding of signs/ symptoms fluid overload along with corresponding action plan-- he denies signs/ symptoms CHF yellow zone today  Chronic Kidney Disease (Status: 10/14/21: Goal Met.)  Last practice recorded BP readings:  BP Readings from Last  3 Encounters:  09/17/21 136/70  08/24/21 (!) 134/96  07/15/21 125/78  Most recent eGFR/CrCl: No results found for: EGFR  No components found for: CRCL  Evaluation of current treatment plan related to chronic kidney disease self management and patient's adherence to plan as established by provider      Reviewed prescribed diet renal/ heart healthy, low salt: confirmed  he has has formal diet/ nutrition education for renal diet provided at hemodialysis center Counseled on the importance of exercise goals with target of 150 minutes per week     Discussed the impact of chronic kidney disease on daily life and mental health and acknowledged and normalized feelings of disempowerment, fear, and frustration    Discussed general plan for upcoming renal/ kidney transplant: reports "still on waiting list" with [facility] transplant team; confirms he has maintained communication with team "regularly" and hopes to be placed on "priority" transplant list soon"  Discussed patient's ongoing toleration of hemodialysis: reports has continued to tolerate well- and has remained adherent to attending as scheduled- has had no missed HD sessions since our last outreach Confirmed patient continues to deny issues with signs/ symptoms low blood pressure outside of hemodialysis sessions Confirmed patient continues adherent to daily fluid restriction guidelines: 3200 cc/d per patient report Confirmed patient has been adherent to hemodialysis schedule: has not missed any HD sessions, attending M-W-F in "mid-afternoon" hours     Plan:  No further follow up required: patient denies current care coordination/ care management needs and is agreeable to RN CM case closure today; case closure accordingly     Oneta Rack, RN, BSN, [place] [phone]: direct office

## 2021-10-15 DIAGNOSIS — N186 End stage renal disease: Secondary | ICD-10-CM | POA: Diagnosis not present

## 2021-10-15 DIAGNOSIS — N2581 Secondary hyperparathyroidism of renal origin: Secondary | ICD-10-CM | POA: Diagnosis not present

## 2021-10-15 DIAGNOSIS — Z992 Dependence on renal dialysis: Secondary | ICD-10-CM | POA: Diagnosis not present

## 2021-10-17 DIAGNOSIS — N2581 Secondary hyperparathyroidism of renal origin: Secondary | ICD-10-CM | POA: Diagnosis not present

## 2021-10-17 DIAGNOSIS — Z992 Dependence on renal dialysis: Secondary | ICD-10-CM | POA: Diagnosis not present

## 2021-10-17 DIAGNOSIS — N186 End stage renal disease: Secondary | ICD-10-CM | POA: Diagnosis not present

## 2021-10-19 ENCOUNTER — Emergency Department (HOSPITAL_COMMUNITY)
Admission: EM | Admit: 2021-10-19 | Discharge: 2021-10-19 | Disposition: A | Discharge status: Home / Self Care | Payer: Medicare HMO | Attending: Emergency Medicine | Admitting: Emergency Medicine

## 2021-10-19 ENCOUNTER — Other Ambulatory Visit: Payer: Self-pay

## 2021-10-19 ENCOUNTER — Emergency Department (HOSPITAL_COMMUNITY): Payer: Medicare HMO

## 2021-10-19 DIAGNOSIS — R69 Illness, unspecified: Secondary | ICD-10-CM | POA: Insufficient documentation

## 2021-10-19 DIAGNOSIS — I6621 Occlusion and stenosis of right posterior cerebral artery: Secondary | ICD-10-CM | POA: Diagnosis not present

## 2021-10-19 DIAGNOSIS — Z7982 Long term (current) use of aspirin: Secondary | ICD-10-CM | POA: Insufficient documentation

## 2021-10-19 DIAGNOSIS — R42 Dizziness and giddiness: Secondary | ICD-10-CM | POA: Diagnosis not present

## 2021-10-19 DIAGNOSIS — I951 Orthostatic hypotension: Secondary | ICD-10-CM | POA: Insufficient documentation

## 2021-10-19 DIAGNOSIS — Z992 Dependence on renal dialysis: Secondary | ICD-10-CM | POA: Insufficient documentation

## 2021-10-19 DIAGNOSIS — I6782 Cerebral ischemia: Secondary | ICD-10-CM | POA: Insufficient documentation

## 2021-10-19 DIAGNOSIS — Z79899 Other long term (current) drug therapy: Secondary | ICD-10-CM | POA: Diagnosis not present

## 2021-10-19 DIAGNOSIS — R519 Headache, unspecified: Secondary | ICD-10-CM | POA: Insufficient documentation

## 2021-10-19 DIAGNOSIS — I120 Hypertensive chronic kidney disease with stage 5 chronic kidney disease or end stage renal disease: Secondary | ICD-10-CM | POA: Insufficient documentation

## 2021-10-19 DIAGNOSIS — Z85850 Personal history of malignant neoplasm of thyroid: Secondary | ICD-10-CM | POA: Diagnosis not present

## 2021-10-19 DIAGNOSIS — N186 End stage renal disease: Secondary | ICD-10-CM | POA: Insufficient documentation

## 2021-10-19 DIAGNOSIS — H5500 Unspecified nystagmus: Secondary | ICD-10-CM | POA: Insufficient documentation

## 2021-10-19 LAB — CBC
HCT: 34.8 % — ABNORMAL LOW (ref 39.0–52.0)
Hemoglobin: 11 g/dL — ABNORMAL LOW (ref 13.0–17.0)
MCH: 28.9 pg (ref 26.0–34.0)
MCHC: 31.6 g/dL (ref 30.0–36.0)
MCV: 91.3 fL (ref 80.0–100.0)
Platelets: 178 10*3/uL (ref 150–400)
RBC: 3.81 MIL/uL — ABNORMAL LOW (ref 4.22–5.81)
RDW: 15.5 % (ref 11.5–15.5)
WBC: 5.2 10*3/uL (ref 4.0–10.5)
nRBC: 0 % (ref 0.0–0.2)

## 2021-10-19 LAB — COMPREHENSIVE METABOLIC PANEL
ALT: 10 U/L (ref 0–44)
AST: 8 U/L — ABNORMAL LOW (ref 15–41)
Albumin: 3.7 g/dL (ref 3.5–5.0)
Alkaline Phosphatase: 69 U/L (ref 38–126)
Anion gap: 10 (ref 5–15)
BUN: 45 mg/dL — ABNORMAL HIGH (ref 6–20)
CO2: 25 mmol/L (ref 22–32)
Calcium: 9.1 mg/dL (ref 8.9–10.3)
Chloride: 99 mmol/L (ref 98–111)
Creatinine, Ser: 10.9 mg/dL — ABNORMAL HIGH (ref 0.61–1.24)
GFR, Estimated: 5 mL/min — ABNORMAL LOW (ref >60)
Glucose, Bld: 103 mg/dL — ABNORMAL HIGH (ref 70–99)
Potassium: 3.8 mmol/L (ref 3.5–5.1)
Sodium: 134 mmol/L — ABNORMAL LOW (ref 135–145)
Total Bilirubin: 0.8 mg/dL (ref 0.3–1.2)
Total Protein: 6.1 g/dL — ABNORMAL LOW (ref 6.5–8.1)

## 2021-10-19 LAB — DIFFERENTIAL
Abs Immature Granulocytes: 0.02 10*3/uL (ref 0.00–0.07)
Basophils Absolute: 0 10*3/uL (ref 0.0–0.1)
Basophils Relative: 1 %
Eosinophils Absolute: 0.2 10*3/uL (ref 0.0–0.5)
Eosinophils Relative: 3 %
Immature Granulocytes: 0 %
Lymphocytes Relative: 33 %
Lymphs Abs: 1.7 10*3/uL (ref 0.7–4.0)
Monocytes Absolute: 0.5 10*3/uL (ref 0.1–1.0)
Monocytes Relative: 9 %
Neutro Abs: 2.8 10*3/uL (ref 1.7–7.7)
Neutrophils Relative %: 54 %

## 2021-10-19 LAB — ETHANOL: Alcohol, Ethyl (B): <10 mg/dL (ref <10)

## 2021-10-19 MED ORDER — MECLIZINE HCL 25 MG PO TABS: 25.0000 mg | ORAL_TABLET | Freq: Three times a day (TID) | ORAL | 0 refills | Status: DC | PRN

## 2021-10-19 MED ORDER — LORAZEPAM 2 MG/ML IJ SOLN
0.5000 mg | Freq: Once | INTRAMUSCULAR | Status: AC | PRN
  Administered 2021-10-19: 0.5 mg via INTRAVENOUS
  Filled 2021-10-19: qty 1

## 2021-10-19 MED ORDER — SODIUM CHLORIDE 0.9% FLUSH
3.0000 mL | Freq: Once | INTRAVENOUS | Status: AC
  Administered 2021-10-19: 3 mL via INTRAVENOUS

## 2021-10-19 NOTE — ED Notes (Signed)
Patient transported to MRI 

## 2021-10-19 NOTE — ED Triage Notes (Signed)
Patient complains of ongoing dizziness x 4 months. Has been seen for same with neurology involved. Reports last night dizziness worse and the spinning worse. Alert and oriented with mild headache

## 2021-10-19 NOTE — ED Provider Notes (Addendum)
[facility] EMERGENCY DEPARTMENT Provider Note   CSN: [id number] Arrival date & time: 10/19/21  1751     History  No chief complaint on file.   Craig Flynn is a 61 y.o. male.  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  Patient reports about 4 months of dizziness symptoms.  He has been seen by neurology, Dr. Delice Lesch, [phone].  Symptoms are similar to described in her note.  He has a combination of throbbing at the top of the head and in the right ear that qualifies a headache that waxes and wanes in severity.  Patient also reports lightheadedness that he describes more as a sensation of feeling like he might pass out or fall.  However he also describes vertiginous symptoms.  Patient reports that he rolled over in bed this morning and was so dizzy with spinning that he could not get up.  However, he more often experiences a sensation of lightheadedness rather than spinning quality dizziness.  He denies any loss of vision or double vision.  No focal weakness numbness or tingling.  He does not note any difference before or after dialysis.  He has not had any falls or traumatic injury.  He was prescribed Neurontin by Dr. Delice Lesch but does not find any improvement and does not take it.  He was supposed to get an MRI ordered by Dr. Delice Lesch however because he was going to have to pay $400, he did not get the MRI done.  He has been seen several times in the emergency department for similar symptoms.    Home Medications Prior to Admission medications   Medication Sig Start Date End Date Taking? Authorizing Provider  acetaminophen (TYLENOL) 500 MG tablet Take 1,000 mg by mouth every 6 (six) hours as needed for moderate pain.    [provider]  albuterol (VENTOLIN HFA) 108 (90 Base) MCG/ACT inhaler INHALE 2 PUFFS INTO THE LUNGS EVERY 6 HOURS AS NEEDED FOR WHEEZING OR SHORTNESS OF BREATH Patient taking differently: Inhale 2 puffs  into the lungs every 6 (six) hours as needed for wheezing or shortness of breath. 08/01/20   Biagio Borg, MD  aspirin EC 81 MG tablet Take 81 mg by mouth daily.    [provider]  carvedilol (COREG) 25 MG tablet TAKE 1 TABLET(25 MG) BY MOUTH TWICE DAILY 12/05/20   Biagio Borg, MD  diazepam (VALIUM) 5 MG tablet 1 tab by mouth 45 min prior to MRI 05/23/21   Biagio Borg, MD  fenofibrate 54 MG tablet TAKE 1 TABLET BY MOUTH EVERY DAY 08/07/20   Minus Breeding, MD  gabapentin (NEURONTIN) 300 MG capsule Take 1 capsule (300 mg total) by mouth at bedtime. 09/17/21   Biagio Borg, MD  isosorbide mononitrate (IMDUR) 30 MG 24 hr tablet TAKE 2 TABLETS BY MOUTH DAILY. SCHEDULE OFFICE VISIT FOR FUTURE REFILLS 02/05/21   Minus Breeding, MD  ketoconazole (NIZORAL) 2 % cream Apply 1 application topically daily. 02/11/21   Lorenda Peck, DPM  latanoprost (XALATAN) 0.005 % ophthalmic solution Place 1 drop into the left eye at bedtime. 05/08/21   [provider]  lidocaine-prilocaine (EMLA) cream Apply 1 application topically 3 (three) times a week. MWF 11/14/20   [provider]  nitroGLYCERIN (NITROSTAT) 0.4 MG SL tablet DISSOLVE 1 TABLET UNDER THE TONGUE EVERY 5 MINUTES AS NEEDED FOR CHEST PAIN Patient taking differently: Place 0.4 mg under the tongue every 5 (five) minutes as needed  for chest pain. 03/29/19   Tami Lin, MD  oxyCODONE-acetaminophen (PERCOCET/ROXICET) 5-325 MG tablet Take 1 tablet by mouth every 6 (six) hours as needed for severe pain. Patient not taking: Reported on 07/15/2021 05/09/21   Loeffler, Adora Fridge, PA-C  pantoprazole (PROTONIX) 40 MG tablet Take 1 tablet (40 mg total) by mouth 2 (two) times daily before a meal. Patient not taking: Reported on 05/13/2021 01/17/21   Biagio Borg, MD  Potassium Chloride ER 20 MEQ TBCR Take 1 tablet by mouth daily. Patient not taking: Reported on 07/15/2021 11/26/20   [provider]  thiamine (VITAMIN B-1) 50 MG  tablet Take 1 tablet (50 mg total) by mouth daily. Patient not taking: Reported on 05/07/2021 03/25/20   Janith Lima, MD  tiZANidine (ZANAFLEX) 2 MG tablet TAKE 1 TABLET(2 MG) BY MOUTH DAILY Patient not taking: Reported on 05/07/2021 04/10/21   Maximiano Coss, NP  vitamin B-12 (CYANOCOBALAMIN) 1000 MCG tablet Take 1,000 mcg by mouth daily.    [provider]      Allergies    Patient has no known allergies.    Review of Systems   Review of Systems 10 systems reviewed negative except as per HPI Physical Exam Updated Vital Signs BP 115/69 (BP Location: Right Arm)   Pulse 63   Temp 98 F (36.7 C) (Oral)   Resp 17   SpO2 100%  Physical Exam Constitutional:      Comments: Alert nontoxic well in appearance  HENT:     Head: Normocephalic and atraumatic.     Right Ear: Tympanic membrane normal.     Left Ear: Tympanic membrane normal.     Nose: Nose normal.     Mouth/Throat:     Mouth: Mucous membranes are moist.     Pharynx: Oropharynx is clear.  Eyes:     Extraocular Movements: Extraocular movements intact.     Pupils: Pupils are equal, round, and reactive to light.     Comments: Subtle nystagmus with rightward gaze.  Ocular motions are conjugate  Cardiovascular:     Rate and Rhythm: Normal rate and regular rhythm.  Pulmonary:     Breath sounds: Normal breath sounds.  Abdominal:     General: There is no distension.     Palpations: Abdomen is soft.     Tenderness: There is no abdominal tenderness. There is no guarding.  Musculoskeletal:        General: No swelling or tenderness. Normal range of motion.     Cervical back: Neck supple.     Right lower leg: No edema.     Left lower leg: No edema.  Skin:    General: Skin is warm and dry.  Neurological:     General: No focal deficit present.     Mental Status: He is oriented to person, place, and time.     Cranial Nerves: No cranial nerve deficit.     Sensory: No sensory deficit.     Motor: No weakness.      Coordination: Coordination normal.     Comments: Finger-nose exam is intact bilaterally.  Patient has very slight tremor that is symmetric.  Psychiatric:        Mood and Affect: Mood normal.     ED Results / Procedures / Treatments   Labs (all labs ordered are listed, but only abnormal results are displayed) Labs Reviewed  CBC - Abnormal; Notable for the following components:      Result Value   RBC  3.81 (*)    Hemoglobin 11.0 (*)    HCT 34.8 (*)    All other components within normal limits  COMPREHENSIVE METABOLIC PANEL - Abnormal; Notable for the following components:   Sodium 134 (*)    Glucose, Bld 103 (*)    BUN 45 (*)    Creatinine, Ser 10.90 (*)    Total Protein 6.1 (*)    AST 8 (*)    GFR, Estimated 5 (*)    All other components within normal limits  DIFFERENTIAL  ETHANOL  PROTIME-INR  APTT    EKG EKG Interpretation  Date/Time:  Sunday October 19 2021 09:41:39 EDT Ventricular Rate:  64 PR Interval:  234 QRS Duration: 94 QT Interval:  438 QTC Calculation: 451 R Axis:   32 Text Interpretation: Sinus rhythm with 1st degree A-V block Otherwise normal ECG When compared with ECG of 24-Aug-2021 07:36, PREVIOUS ECG IS PRESENT isolated t wave inversion III, otherwise similar to older tracings Confirmed by Charlesetta Shanks [phone]) on 10/19/2021 11:44:18 AM  Radiology CT HEAD WO CONTRAST  Result Date: 10/19/2021 CLINICAL DATA:  A 61 year old male presents for evaluation none of dizziness for 4 months. EXAM: CT HEAD WITHOUT CONTRAST TECHNIQUE: Contiguous axial images were obtained from the base of the skull through the vertex without intravenous contrast. RADIATION DOSE REDUCTION: This exam was performed according to the departmental dose-optimization program which includes automated exposure control, adjustment of the mA and/or kV according to patient size and/or use of iterative reconstruction technique. COMPARISON:  August 24, 2021. FINDINGS: Brain: No evidence of acute  infarction, hemorrhage, hydrocephalus, extra-axial collection or mass lesion/mass effect. Signs of atrophy and patchy areas of hypoattenuation in periventricular white matter. Vascular: No hyperdense vessel or unexpected calcification. Skull: Normal. Negative for fracture or focal lesion. Sinuses/Orbits: Visualized paranasal sinuses and orbits without acute process. Mild mucosal thickening of LEFT maxillary sinus. Signs of prior injury to the LEFT lamina papyracea/medial orbital wall without change. Other: None IMPRESSION: 1. No acute intracranial abnormality. 2. Signs of atrophy and white matter disease, most commonly due to chronic small vessel ischemic disease without change. Electronically Signed   By: Zetta Bills M.D.   On: 10/19/2021 10:30    Procedures Procedures    Medications Ordered in ED Medications  sodium chloride flush (NS) 0.9 % injection 3 mL (has no administration in time range)    ED Course/ Medical Decision Making/ A&P                           Medical Decision Making Amount and/or Complexity of Data Reviewed Labs: ordered. Radiology: ordered.  Risk Prescription drug management.   Patient presents with persistent problems with headache and dizziness that encompasses both quality of lightheadedness as well as vertigo.  Patient had persistent problems with pulsatile tinnitus and or throbbing headache.  At this time given duration of symptoms lower suspicion for acute subarachnoid hemorrhage.  Patient typically is more hypotensive thus hypertensive encephalopathy unlikely.  Patient does have complex medical history including dialysis and thyroid cancer.  Patient was supposed to get an MRI but did not on outpatient basis.  At this time other considerations in differential diagnosis include aneurysm\dural venous thrombosis\migraine\peripheral vertigo.  We will proceed with MRI\MRI\MRV to rule out above diagnoses.  Patient's basic lab work is within normal limits for his  baseline.  No acute anemia, leukocytosis, electrolyte derangement.  At baseline patient is ESRD on dialysis but potassium is at 3.8.  At this time I do not suspect patient symptoms are secondary to hypovolemia or electrolyte derangement.  Patient has had orthostatic hypotension secondary to blood pressure medications.  Will check orthostatic blood pressures.  Clinically, patient is euvolemic in appearance.   Orthostatic vital signs are stable.  No drop in blood pressure or significant elevation in heart rate.  At this time no indication that medication adjustments indicated.  Patient has normal blood pressures, well controlled on current regimen by evaluation today.  MRI results will be pending for final disposition.  Anticipate if MRI results are unremarkable, patient will be appropriate for discharge and continued follow-up with neurology and PCP.  Oncoming team to follow-up on MRI results for final disposition. Final Clinical Impression(s) / ED Diagnoses Final diagnoses:  Bad headache  Vertigo  Light headedness  Severe comorbid illness    Rx / DC Orders ED Discharge Orders     None         Charlesetta Shanks, MD 10/19/21 1226    Charlesetta Shanks, MD 10/19/21 1450

## 2021-10-19 NOTE — ED Provider Notes (Signed)
  Physical Exam  BP (!) 133/99 (BP Location: Right Arm)   Pulse 63   Temp 97.8 F (36.6 C)   Resp 14   SpO2 100%   Physical Exam  Procedures  Procedures  ED Course / MDM    Medical Decision Making Amount and/or Complexity of Data Reviewed Labs: ordered. Radiology: ordered.  Risk Prescription drug management.   Received care of patient from Dr. Vallery Ridge.  Please see her note for prior history, physical and care.  Briefly this is a 61 year old male with a history of hypertension, ESRD on HD, thyroid cancer status post thyroidectomy, who presents with concern for dizziness and headache.  MRI brain, MRV and MRA are ordered and pending at time of transfer of care.  MRI brain shows no acute intracranial abnormality, chronic microvascular ischemic changes.  The MRA was negative for large vessel acute lesion or other acute finding, has short segment stenosis involving the right P1.  Recommend follow up with Neurology, PCP, ENT. Given rx for meclizine.       Craig Morgan, MD 10/20/21 1114

## 2021-10-19 NOTE — ED Notes (Signed)
Patient verbalizes understanding of discharge instructions. Opportunity for questioning and answers were provided. Armband removed by staff, pt discharged from ED.  

## 2021-10-20 ENCOUNTER — Telehealth: Payer: Self-pay | Admitting: Internal Medicine

## 2021-10-20 DIAGNOSIS — N186 End stage renal disease: Secondary | ICD-10-CM | POA: Diagnosis not present

## 2021-10-20 DIAGNOSIS — N2581 Secondary hyperparathyroidism of renal origin: Secondary | ICD-10-CM | POA: Diagnosis not present

## 2021-10-20 DIAGNOSIS — Z992 Dependence on renal dialysis: Secondary | ICD-10-CM | POA: Diagnosis not present

## 2021-10-20 DIAGNOSIS — I129 Hypertensive chronic kidney disease with stage 1 through stage 4 chronic kidney disease, or unspecified chronic kidney disease: Secondary | ICD-10-CM | POA: Diagnosis not present

## 2021-10-20 NOTE — Telephone Encounter (Signed)
Pt called and requested a new RX, he would like something for heartburn.   Please send to [facility] [phone] [place]   Phone:  [phone]  Fax:  [phone]

## 2021-10-21 MED ORDER — PANTOPRAZOLE SODIUM 40 MG PO TBEC: 40.0000 mg | DELAYED_RELEASE_TABLET | Freq: Every day | ORAL | 3 refills | Status: AC

## 2021-10-21 NOTE — Telephone Encounter (Signed)
Ok to restart protonix at 40 qd - done erx

## 2021-10-22 DIAGNOSIS — N186 End stage renal disease: Secondary | ICD-10-CM | POA: Diagnosis not present

## 2021-10-22 DIAGNOSIS — N2581 Secondary hyperparathyroidism of renal origin: Secondary | ICD-10-CM | POA: Diagnosis not present

## 2021-10-22 DIAGNOSIS — Z992 Dependence on renal dialysis: Secondary | ICD-10-CM | POA: Diagnosis not present

## 2021-10-22 NOTE — Telephone Encounter (Signed)
Patient notified

## 2021-10-24 DIAGNOSIS — Z992 Dependence on renal dialysis: Secondary | ICD-10-CM | POA: Diagnosis not present

## 2021-10-24 DIAGNOSIS — N186 End stage renal disease: Secondary | ICD-10-CM | POA: Diagnosis not present

## 2021-10-24 DIAGNOSIS — N2581 Secondary hyperparathyroidism of renal origin: Secondary | ICD-10-CM | POA: Diagnosis not present

## 2021-10-27 DIAGNOSIS — Z992 Dependence on renal dialysis: Secondary | ICD-10-CM | POA: Diagnosis not present

## 2021-10-27 DIAGNOSIS — N2581 Secondary hyperparathyroidism of renal origin: Secondary | ICD-10-CM | POA: Diagnosis not present

## 2021-10-27 DIAGNOSIS — N186 End stage renal disease: Secondary | ICD-10-CM | POA: Diagnosis not present

## 2021-10-28 ENCOUNTER — Telehealth: Payer: Self-pay | Admitting: *Deleted

## 2021-10-28 NOTE — Telephone Encounter (Signed)
        Patient  visited [facility] ed on 10/20/2021  for headache   Telephone encounter attempt :  1st  A HIPAA compliant voice message was left requesting a return call.  Instructed patient to call back at [phone].  [place] [phone] [address] , [place] [address] Email : [email or web address]

## 2021-10-29 DIAGNOSIS — N186 End stage renal disease: Secondary | ICD-10-CM | POA: Diagnosis not present

## 2021-10-29 DIAGNOSIS — Z992 Dependence on renal dialysis: Secondary | ICD-10-CM | POA: Diagnosis not present

## 2021-10-29 DIAGNOSIS — N2581 Secondary hyperparathyroidism of renal origin: Secondary | ICD-10-CM | POA: Diagnosis not present

## 2021-10-31 DIAGNOSIS — N2581 Secondary hyperparathyroidism of renal origin: Secondary | ICD-10-CM | POA: Diagnosis not present

## 2021-10-31 DIAGNOSIS — N186 End stage renal disease: Secondary | ICD-10-CM | POA: Diagnosis not present

## 2021-10-31 DIAGNOSIS — Z992 Dependence on renal dialysis: Secondary | ICD-10-CM | POA: Diagnosis not present

## 2021-11-03 DIAGNOSIS — N186 End stage renal disease: Secondary | ICD-10-CM | POA: Diagnosis not present

## 2021-11-03 DIAGNOSIS — N2581 Secondary hyperparathyroidism of renal origin: Secondary | ICD-10-CM | POA: Diagnosis not present

## 2021-11-03 DIAGNOSIS — Z992 Dependence on renal dialysis: Secondary | ICD-10-CM | POA: Diagnosis not present

## 2021-11-05 DIAGNOSIS — N2581 Secondary hyperparathyroidism of renal origin: Secondary | ICD-10-CM | POA: Diagnosis not present

## 2021-11-05 DIAGNOSIS — Z992 Dependence on renal dialysis: Secondary | ICD-10-CM | POA: Diagnosis not present

## 2021-11-05 DIAGNOSIS — N186 End stage renal disease: Secondary | ICD-10-CM | POA: Diagnosis not present

## 2021-11-07 ENCOUNTER — Ambulatory Visit (INDEPENDENT_AMBULATORY_CARE_PROVIDER_SITE_OTHER): Payer: Medicare HMO | Admitting: Internal Medicine

## 2021-11-07 VITALS — BP 130/74 | HR 74 | Temp 98.2°F | Ht 74.0 in | Wt 262.0 lb

## 2021-11-07 DIAGNOSIS — E559 Vitamin D deficiency, unspecified: Secondary | ICD-10-CM | POA: Diagnosis not present

## 2021-11-07 DIAGNOSIS — I10 Essential (primary) hypertension: Secondary | ICD-10-CM

## 2021-11-07 DIAGNOSIS — R739 Hyperglycemia, unspecified: Secondary | ICD-10-CM

## 2021-11-07 DIAGNOSIS — Z720 Tobacco use: Secondary | ICD-10-CM

## 2021-11-07 DIAGNOSIS — N186 End stage renal disease: Secondary | ICD-10-CM | POA: Diagnosis not present

## 2021-11-07 DIAGNOSIS — R42 Dizziness and giddiness: Secondary | ICD-10-CM | POA: Diagnosis not present

## 2021-11-07 DIAGNOSIS — Z992 Dependence on renal dialysis: Secondary | ICD-10-CM | POA: Diagnosis not present

## 2021-11-07 DIAGNOSIS — N2581 Secondary hyperparathyroidism of renal origin: Secondary | ICD-10-CM | POA: Diagnosis not present

## 2021-11-07 MED ORDER — MECLIZINE HCL 25 MG PO TABS: 25.0000 mg | ORAL_TABLET | Freq: Three times a day (TID) | ORAL | 2 refills | Status: DC | PRN

## 2021-11-07 MED ORDER — MIDODRINE HCL 2.5 MG PO TABS
2.5000 mg | ORAL_TABLET | Freq: Three times a day (TID) | ORAL | 2 refills | Status: DC
Start: 2021-11-07 — End: 2023-09-01

## 2021-11-07 NOTE — Progress Notes (Unsigned)
Patient ID: Craig Flynn, male   DOB: 1960-10-28, 61 y.o.   MRN: [id number]        Chief Complaint: follow up low blood pressures, vertigo, smoking, low vit d       HPI:  Craig Flynn is a 61 y.o. male here with c/o persistent low BP especially after HD in the 90s or less despite change increase in dry wt.  Pt denies chest pain, increased sob or doe, wheezing, orthopnea, PND, increased LE swelling, palpitations, or syncope.   Pt denies polydipsia, polyuria, or new focal neuro s/s.    Pt denies fever, wt loss, night sweats, loss of appetite, or other constitutional symptoms  Still smoking, not ready to quit.  Not taking Vit d       Wt Readings from Last 3 Encounters:  11/07/21 262 lb (118.8 kg)  10/19/21 259 lb (117.5 kg)  09/17/21 259 lb (117.5 kg)   BP Readings from Last 3 Encounters:  11/07/21 130/74  10/19/21 (!) 153/88  09/17/21 136/70         Past Medical History:  Diagnosis Date   Anemia 06/2015   Arthritis    knee   Cancer ([place])    thyroid cancer   Cardiomyopathy ([place])    a. 10/2013: EF reduced to 35-40% b. 09/2014: EF improved to 55-60%, Grade 1 DD noted.   Chest pain 06/2015   CHF (congestive heart failure) (HCC)    CKD (chronic kidney disease), stage IV [facility])    sees  Dr Lorrene Reid   Dyspnea    with exerion    GERD (gastroesophageal reflux disease)    Glaucoma    Gout    Hypertension    Obesity    Pancreatitis    Sleep apnea    not wearing c-pap now, needs new slep study per pt. (01/22/2017)   Tuberculosis 1981   while the pt was in the service.   Tubular adenoma of colon 10/2015   Past Surgical History:  Procedure Laterality Date   AV FISTULA PLACEMENT Left 03/25/2016   Procedure: Creation of Left Arm ARTERIOVENOUS (AV) FISTULA;  Surgeon: Elam Dutch, MD;  Location: [facility];  Service: Vascular;  Laterality: Left;   AV FISTULA PLACEMENT Left 08/27/2020   Procedure: LEFT BASILIC VEIN ARTERIOVENOUS (AV) FISTULA CREATION;  Surgeon: Rosetta Posner, MD;  Location: [facility];  Service: Vascular;  Laterality: Left;   [place] Left 10/22/2020   Procedure: LEFT SECOND STAGE BASILIC VEIN TRANSPOSITION;  Surgeon: Elam Dutch, MD;  Location: [facility];  Service: Vascular;  Laterality: Left;   COLONOSCOPY W/ BIOPSIES AND POLYPECTOMY     "no problem"   HERNIA REPAIR     IR FLUORO GUIDE CV LINE RIGHT  08/22/2020   IR US GUIDE VASC ACCESS RIGHT  08/22/2020   LAPAROSCOPIC CHOLECYSTECTOMY     LIGATION OF COMPETING BRANCHES OF ARTERIOVENOUS FISTULA Left 01/25/2018   Procedure: LIGATION OF COMPETING BRANCHES And Revision of ARTERIOVENOUS FISTULA LEFT ARM.;  Surgeon: Elam Dutch, MD;  Location: [facility];  Service: Vascular;  Laterality: Left;   THYROIDECTOMY  01/22/2017   THYROIDECTOMY Left 01/22/2017   Procedure: THYROIDECTOMY;  Surgeon: Melida Quitter, MD;  Location: [facility];  Service: ENT;  Laterality: Left;   THYROIDECTOMY Right 03/26/2017   Procedure: COMPLETION OF THYROIDECTOMY;  Surgeon: Melida Quitter, MD;  Location: [facility];  Service: ENT;  Laterality: Right;   UMBILICAL HERNIA REPAIR     WISDOM TOOTH EXTRACTION  reports that he has been smoking cigarettes. He has never used smokeless tobacco. He reports that he does not currently use alcohol. He reports that he does not use drugs. family history includes Diabetes in his mother; Healthy in his maternal grandfather, paternal grandfather, and paternal grandmother; Hypertension in his maternal grandmother; Kidney disease in his maternal grandmother; Pneumonia in his father; Stroke in his mother. No Known Allergies Current Outpatient Medications on File Prior to Visit  Medication Sig Dispense Refill   acetaminophen (TYLENOL) 500 MG tablet Take 1,000 mg by mouth every 6 (six) hours as needed for moderate pain.     albuterol (VENTOLIN HFA) 108 (90 Base) MCG/ACT inhaler INHALE 2 PUFFS INTO THE LUNGS EVERY 6 HOURS AS NEEDED FOR WHEEZING OR SHORTNESS OF BREATH (Patient taking differently: Inhale 2 puffs into the  lungs every 6 (six) hours as needed for wheezing or shortness of breath.) 18 g 5   aspirin EC 81 MG tablet Take 81 mg by mouth daily.     carvedilol (COREG) 25 MG tablet TAKE 1 TABLET(25 MG) BY MOUTH TWICE DAILY 180 tablet 2   diazepam (VALIUM) 5 MG tablet 1 tab by mouth 45 min prior to MRI 1 tablet 0   fenofibrate 54 MG tablet TAKE 1 TABLET BY MOUTH EVERY DAY 60 tablet 0   gabapentin (NEURONTIN) 300 MG capsule Take 1 capsule (300 mg total) by mouth at bedtime. 90 capsule 1   isosorbide mononitrate (IMDUR) 30 MG 24 hr tablet TAKE 2 TABLETS BY MOUTH DAILY. SCHEDULE OFFICE VISIT FOR FUTURE REFILLS 180 tablet 3   ketoconazole (NIZORAL) 2 % cream Apply 1 application topically daily. 60 g 2   latanoprost (XALATAN) 0.005 % ophthalmic solution Place 1 drop into the left eye at bedtime.     lidocaine-prilocaine (EMLA) cream Apply 1 application topically 3 (three) times a week. MWF     nitroGLYCERIN (NITROSTAT) 0.4 MG SL tablet DISSOLVE 1 TABLET UNDER THE TONGUE EVERY 5 MINUTES AS NEEDED FOR CHEST PAIN (Patient taking differently: Place 0.4 mg under the tongue every 5 (five) minutes as needed for chest pain.) 25 tablet 3   oxyCODONE-acetaminophen (PERCOCET/ROXICET) 5-325 MG tablet Take 1 tablet by mouth every 6 (six) hours as needed for severe pain. 15 tablet 0   pantoprazole (PROTONIX) 40 MG tablet Take 1 tablet (40 mg total) by mouth daily. 90 tablet 3   Potassium Chloride ER 20 MEQ TBCR Take 1 tablet by mouth daily.     thiamine (VITAMIN B-1) 50 MG tablet Take 1 tablet (50 mg total) by mouth daily. 90 tablet 1   tiZANidine (ZANAFLEX) 2 MG tablet TAKE 1 TABLET(2 MG) BY MOUTH DAILY 30 tablet 0   vitamin B-12 (CYANOCOBALAMIN) 1000 MCG tablet Take 1,000 mcg by mouth daily.     No current facility-administered medications on file prior to visit.        ROS:  All others reviewed and negative.  Objective        PE:  BP 130/74 (BP Location: Right Arm, Patient Position: Sitting, Cuff Size: Large)   Pulse  74   Temp 98.2 F (36.8 C) (Oral)   Ht '6\' 2"'$  (1.88 m)   Wt 262 lb (118.8 kg)   SpO2 92%   BMI 33.64 kg/m                 Constitutional: Pt appears in NAD               HENT: Head: NCAT.  Right Ear: External ear normal.                 Left Ear: External ear normal.                Eyes: . Pupils are equal, round, and reactive to light. Conjunctivae and EOM are normal               Nose: without d/c or deformity               Neck: Neck supple. Gross normal ROM               Cardiovascular: Normal rate and regular rhythm.                 Pulmonary/Chest: Effort normal and breath sounds without rales or wheezing.                Abd:  Soft, NT, ND, + BS, no organomegaly               Neurological: Pt is alert. At baseline orientation, motor grossly intact               Skin: Skin is warm. No rashes, no other new lesions, LE edema - none               Psychiatric: Pt behavior is normal without agitation   Micro: none  Cardiac tracings I have personally interpreted today:  none  Pertinent Radiological findings (summarize): none   Lab Results  Component Value Date   WBC 5.2 10/19/2021   HGB 11.0 (L) 10/19/2021   HCT 34.8 (L) 10/19/2021   PLT 178 10/19/2021   GLUCOSE 103 (H) 10/19/2021   CHOL 132 06/07/2020   TRIG 162 (H) 08/21/2020   HDL 30.40 (L) 06/07/2020   LDLCALC 72 06/07/2020   ALT 10 10/19/2021   AST 8 (L) 10/19/2021   NA 134 (L) 10/19/2021   K 3.8 10/19/2021   CL 99 10/19/2021   CREATININE 10.90 (H) 10/19/2021   BUN 45 (H) 10/19/2021   CO2 25 10/19/2021   TSH 0.50 06/07/2020   PSA 0.67 06/07/2020   INR 1.1 05/09/2021   HGBA1C 5.5 06/07/2020   Assessment/Plan:  Craig Flynn is a 62 y.o. Black or African American [2] male with  has a past medical history of Anemia (06/2015), Arthritis, Cancer ([place]), Cardiomyopathy ([place]), Chest pain (06/2015), CHF (congestive heart failure) ([place]), CKD (chronic kidney disease), stage IV ([facility]), Dyspnea, GERD  (gastroesophageal reflux disease), Glaucoma, Gout, Hypertension, Obesity, Pancreatitis, Sleep apnea, Tuberculosis (1981), and Tubular adenoma of colon (10/2015).  HTN (hypertension) With persistent lows post HD- for trial midodrin 2.5 tid prn,  to f/u any worsening symptoms or concerns  Hyperglycemia Lab Results  Component Value Date   HGBA1C 5.5 06/07/2020   Stable, pt to continue current medical treatment  - diet, wt control   Tobacco abuse Pt counsled to quit, pt not ready  Vertigo Also with positional diziness, for meclizine prn  Vitamin D deficiency Last vitamin D Lab Results  Component Value Date   VD25OH 35.91 06/07/2020   Low, to start oral replacement  Followup: Return in about 3 months (around 02/07/2022).  Cathlean Cower, MD 11/08/2021 8:21 PM [facility]

## 2021-11-07 NOTE — Patient Instructions (Signed)
Please take all new medication as prescribed - the midodrine 2.5 mg three times per day as needed  Please continue all other medications as before, and refills have been done if requested - the meclizine for vertigo  Please have the pharmacy call with any other refills you may need.  Please keep your appointments with your specialists as you may have planned  Please make an Appointment to return in 3 months, or sooner if needed

## 2021-11-08 ENCOUNTER — Encounter: Payer: Self-pay | Admitting: Internal Medicine

## 2021-11-08 DIAGNOSIS — R42 Dizziness and giddiness: Secondary | ICD-10-CM | POA: Insufficient documentation

## 2021-11-08 NOTE — Assessment & Plan Note (Signed)
With persistent lows post HD- for trial midodrin 2.5 tid prn,  to f/u any worsening symptoms or concerns

## 2021-11-08 NOTE — Assessment & Plan Note (Signed)
Lab Results  Component Value Date   HGBA1C 5.5 06/07/2020   Stable, pt to continue current medical treatment  - diet, wt control  

## 2021-11-08 NOTE — Assessment & Plan Note (Signed)
Also with positional diziness, for meclizine prn

## 2021-11-08 NOTE — Assessment & Plan Note (Signed)
Pt counsled to quit, pt not ready °

## 2021-11-08 NOTE — Assessment & Plan Note (Signed)
Last vitamin D Lab Results  Component Value Date   VD25OH 35.91 06/07/2020   Low, to start oral replacement

## 2021-11-10 DIAGNOSIS — Z992 Dependence on renal dialysis: Secondary | ICD-10-CM | POA: Diagnosis not present

## 2021-11-10 DIAGNOSIS — N2581 Secondary hyperparathyroidism of renal origin: Secondary | ICD-10-CM | POA: Diagnosis not present

## 2021-11-10 DIAGNOSIS — N186 End stage renal disease: Secondary | ICD-10-CM | POA: Diagnosis not present

## 2021-11-12 DIAGNOSIS — Z992 Dependence on renal dialysis: Secondary | ICD-10-CM | POA: Diagnosis not present

## 2021-11-12 DIAGNOSIS — N2581 Secondary hyperparathyroidism of renal origin: Secondary | ICD-10-CM | POA: Diagnosis not present

## 2021-11-12 DIAGNOSIS — N186 End stage renal disease: Secondary | ICD-10-CM | POA: Diagnosis not present

## 2021-11-14 DIAGNOSIS — N2581 Secondary hyperparathyroidism of renal origin: Secondary | ICD-10-CM | POA: Diagnosis not present

## 2021-11-14 DIAGNOSIS — Z992 Dependence on renal dialysis: Secondary | ICD-10-CM | POA: Diagnosis not present

## 2021-11-14 DIAGNOSIS — N186 End stage renal disease: Secondary | ICD-10-CM | POA: Diagnosis not present

## 2021-11-17 DIAGNOSIS — Z992 Dependence on renal dialysis: Secondary | ICD-10-CM | POA: Diagnosis not present

## 2021-11-17 DIAGNOSIS — N2581 Secondary hyperparathyroidism of renal origin: Secondary | ICD-10-CM | POA: Diagnosis not present

## 2021-11-17 DIAGNOSIS — N186 End stage renal disease: Secondary | ICD-10-CM | POA: Diagnosis not present

## 2021-11-19 DIAGNOSIS — N186 End stage renal disease: Secondary | ICD-10-CM | POA: Diagnosis not present

## 2021-11-19 DIAGNOSIS — Z992 Dependence on renal dialysis: Secondary | ICD-10-CM | POA: Diagnosis not present

## 2021-11-19 DIAGNOSIS — N2581 Secondary hyperparathyroidism of renal origin: Secondary | ICD-10-CM | POA: Diagnosis not present

## 2021-11-20 DIAGNOSIS — Z992 Dependence on renal dialysis: Secondary | ICD-10-CM | POA: Diagnosis not present

## 2021-11-20 DIAGNOSIS — I129 Hypertensive chronic kidney disease with stage 1 through stage 4 chronic kidney disease, or unspecified chronic kidney disease: Secondary | ICD-10-CM | POA: Diagnosis not present

## 2021-11-20 DIAGNOSIS — N186 End stage renal disease: Secondary | ICD-10-CM | POA: Diagnosis not present

## 2021-11-21 DIAGNOSIS — N186 End stage renal disease: Secondary | ICD-10-CM | POA: Diagnosis not present

## 2021-11-21 DIAGNOSIS — N2581 Secondary hyperparathyroidism of renal origin: Secondary | ICD-10-CM | POA: Diagnosis not present

## 2021-11-21 DIAGNOSIS — Z992 Dependence on renal dialysis: Secondary | ICD-10-CM | POA: Diagnosis not present

## 2021-11-24 DIAGNOSIS — N2581 Secondary hyperparathyroidism of renal origin: Secondary | ICD-10-CM | POA: Diagnosis not present

## 2021-11-24 DIAGNOSIS — N186 End stage renal disease: Secondary | ICD-10-CM | POA: Diagnosis not present

## 2021-11-24 DIAGNOSIS — Z992 Dependence on renal dialysis: Secondary | ICD-10-CM | POA: Diagnosis not present

## 2021-11-26 DIAGNOSIS — N186 End stage renal disease: Secondary | ICD-10-CM | POA: Diagnosis not present

## 2021-11-26 DIAGNOSIS — Z992 Dependence on renal dialysis: Secondary | ICD-10-CM | POA: Diagnosis not present

## 2021-11-26 DIAGNOSIS — N2581 Secondary hyperparathyroidism of renal origin: Secondary | ICD-10-CM | POA: Diagnosis not present

## 2021-11-28 DIAGNOSIS — Z992 Dependence on renal dialysis: Secondary | ICD-10-CM | POA: Diagnosis not present

## 2021-11-28 DIAGNOSIS — N186 End stage renal disease: Secondary | ICD-10-CM | POA: Diagnosis not present

## 2021-11-28 DIAGNOSIS — N2581 Secondary hyperparathyroidism of renal origin: Secondary | ICD-10-CM | POA: Diagnosis not present

## 2021-12-01 DIAGNOSIS — N2581 Secondary hyperparathyroidism of renal origin: Secondary | ICD-10-CM | POA: Diagnosis not present

## 2021-12-01 DIAGNOSIS — Z992 Dependence on renal dialysis: Secondary | ICD-10-CM | POA: Diagnosis not present

## 2021-12-01 DIAGNOSIS — N186 End stage renal disease: Secondary | ICD-10-CM | POA: Diagnosis not present

## 2021-12-03 DIAGNOSIS — N186 End stage renal disease: Secondary | ICD-10-CM | POA: Diagnosis not present

## 2021-12-03 DIAGNOSIS — Z992 Dependence on renal dialysis: Secondary | ICD-10-CM | POA: Diagnosis not present

## 2021-12-03 DIAGNOSIS — N2581 Secondary hyperparathyroidism of renal origin: Secondary | ICD-10-CM | POA: Diagnosis not present

## 2021-12-05 DIAGNOSIS — N2581 Secondary hyperparathyroidism of renal origin: Secondary | ICD-10-CM | POA: Diagnosis not present

## 2021-12-05 DIAGNOSIS — Z992 Dependence on renal dialysis: Secondary | ICD-10-CM | POA: Diagnosis not present

## 2021-12-05 DIAGNOSIS — N186 End stage renal disease: Secondary | ICD-10-CM | POA: Diagnosis not present

## 2021-12-08 DIAGNOSIS — N2581 Secondary hyperparathyroidism of renal origin: Secondary | ICD-10-CM | POA: Diagnosis not present

## 2021-12-08 DIAGNOSIS — N186 End stage renal disease: Secondary | ICD-10-CM | POA: Diagnosis not present

## 2021-12-08 DIAGNOSIS — Z992 Dependence on renal dialysis: Secondary | ICD-10-CM | POA: Diagnosis not present

## 2021-12-10 DIAGNOSIS — N186 End stage renal disease: Secondary | ICD-10-CM | POA: Diagnosis not present

## 2021-12-10 DIAGNOSIS — N2581 Secondary hyperparathyroidism of renal origin: Secondary | ICD-10-CM | POA: Diagnosis not present

## 2021-12-10 DIAGNOSIS — Z992 Dependence on renal dialysis: Secondary | ICD-10-CM | POA: Diagnosis not present

## 2021-12-12 ENCOUNTER — Other Ambulatory Visit: Payer: Self-pay

## 2021-12-12 DIAGNOSIS — N186 End stage renal disease: Secondary | ICD-10-CM | POA: Diagnosis not present

## 2021-12-12 DIAGNOSIS — N2581 Secondary hyperparathyroidism of renal origin: Secondary | ICD-10-CM | POA: Diagnosis not present

## 2021-12-12 DIAGNOSIS — Z992 Dependence on renal dialysis: Secondary | ICD-10-CM | POA: Diagnosis not present

## 2021-12-15 DIAGNOSIS — Z992 Dependence on renal dialysis: Secondary | ICD-10-CM | POA: Diagnosis not present

## 2021-12-15 DIAGNOSIS — N2581 Secondary hyperparathyroidism of renal origin: Secondary | ICD-10-CM | POA: Diagnosis not present

## 2021-12-15 DIAGNOSIS — N186 End stage renal disease: Secondary | ICD-10-CM | POA: Diagnosis not present

## 2021-12-17 DIAGNOSIS — Z992 Dependence on renal dialysis: Secondary | ICD-10-CM | POA: Diagnosis not present

## 2021-12-17 DIAGNOSIS — N186 End stage renal disease: Secondary | ICD-10-CM | POA: Diagnosis not present

## 2021-12-17 DIAGNOSIS — N2581 Secondary hyperparathyroidism of renal origin: Secondary | ICD-10-CM | POA: Diagnosis not present

## 2021-12-19 DIAGNOSIS — N186 End stage renal disease: Secondary | ICD-10-CM | POA: Diagnosis not present

## 2021-12-19 DIAGNOSIS — Z992 Dependence on renal dialysis: Secondary | ICD-10-CM | POA: Diagnosis not present

## 2021-12-19 DIAGNOSIS — N2581 Secondary hyperparathyroidism of renal origin: Secondary | ICD-10-CM | POA: Diagnosis not present

## 2021-12-20 ENCOUNTER — Other Ambulatory Visit: Payer: Self-pay | Admitting: Internal Medicine

## 2021-12-20 DIAGNOSIS — N186 End stage renal disease: Secondary | ICD-10-CM | POA: Diagnosis not present

## 2021-12-20 DIAGNOSIS — I129 Hypertensive chronic kidney disease with stage 1 through stage 4 chronic kidney disease, or unspecified chronic kidney disease: Secondary | ICD-10-CM | POA: Diagnosis not present

## 2021-12-20 DIAGNOSIS — Z992 Dependence on renal dialysis: Secondary | ICD-10-CM | POA: Diagnosis not present

## 2021-12-20 NOTE — Telephone Encounter (Signed)
Please refill as per office routine med refill policy (all routine meds to be refilled for 3 mo or monthly (per pt preference) up to one year from last visit, then month to month grace period for 3 mo, then further med refills will have to be denied) ? ?

## 2021-12-22 DIAGNOSIS — Z992 Dependence on renal dialysis: Secondary | ICD-10-CM | POA: Diagnosis not present

## 2021-12-22 DIAGNOSIS — N2581 Secondary hyperparathyroidism of renal origin: Secondary | ICD-10-CM | POA: Diagnosis not present

## 2021-12-22 DIAGNOSIS — N186 End stage renal disease: Secondary | ICD-10-CM | POA: Diagnosis not present

## 2021-12-23 ENCOUNTER — Ambulatory Visit: Payer: Medicare HMO | Admitting: Neurology

## 2021-12-23 ENCOUNTER — Encounter: Payer: Self-pay | Admitting: Neurology

## 2021-12-23 DIAGNOSIS — Z029 Encounter for administrative examinations, unspecified: Secondary | ICD-10-CM

## 2021-12-24 DIAGNOSIS — Z992 Dependence on renal dialysis: Secondary | ICD-10-CM | POA: Diagnosis not present

## 2021-12-24 DIAGNOSIS — N2581 Secondary hyperparathyroidism of renal origin: Secondary | ICD-10-CM | POA: Diagnosis not present

## 2021-12-24 DIAGNOSIS — N186 End stage renal disease: Secondary | ICD-10-CM | POA: Diagnosis not present

## 2021-12-26 DIAGNOSIS — N2581 Secondary hyperparathyroidism of renal origin: Secondary | ICD-10-CM | POA: Diagnosis not present

## 2021-12-26 DIAGNOSIS — Z992 Dependence on renal dialysis: Secondary | ICD-10-CM | POA: Diagnosis not present

## 2021-12-26 DIAGNOSIS — N186 End stage renal disease: Secondary | ICD-10-CM | POA: Diagnosis not present

## 2021-12-29 DIAGNOSIS — N186 End stage renal disease: Secondary | ICD-10-CM | POA: Diagnosis not present

## 2021-12-29 DIAGNOSIS — N2581 Secondary hyperparathyroidism of renal origin: Secondary | ICD-10-CM | POA: Diagnosis not present

## 2021-12-29 DIAGNOSIS — Z992 Dependence on renal dialysis: Secondary | ICD-10-CM | POA: Diagnosis not present

## 2021-12-31 DIAGNOSIS — Z992 Dependence on renal dialysis: Secondary | ICD-10-CM | POA: Diagnosis not present

## 2021-12-31 DIAGNOSIS — N186 End stage renal disease: Secondary | ICD-10-CM | POA: Diagnosis not present

## 2021-12-31 DIAGNOSIS — N2581 Secondary hyperparathyroidism of renal origin: Secondary | ICD-10-CM | POA: Diagnosis not present

## 2022-01-02 DIAGNOSIS — Z992 Dependence on renal dialysis: Secondary | ICD-10-CM | POA: Diagnosis not present

## 2022-01-02 DIAGNOSIS — N186 End stage renal disease: Secondary | ICD-10-CM | POA: Diagnosis not present

## 2022-01-02 DIAGNOSIS — N2581 Secondary hyperparathyroidism of renal origin: Secondary | ICD-10-CM | POA: Diagnosis not present

## 2022-01-05 DIAGNOSIS — N186 End stage renal disease: Secondary | ICD-10-CM | POA: Diagnosis not present

## 2022-01-05 DIAGNOSIS — N2581 Secondary hyperparathyroidism of renal origin: Secondary | ICD-10-CM | POA: Diagnosis not present

## 2022-01-05 DIAGNOSIS — Z992 Dependence on renal dialysis: Secondary | ICD-10-CM | POA: Diagnosis not present

## 2022-01-07 DIAGNOSIS — N186 End stage renal disease: Secondary | ICD-10-CM | POA: Diagnosis not present

## 2022-01-07 DIAGNOSIS — Z992 Dependence on renal dialysis: Secondary | ICD-10-CM | POA: Diagnosis not present

## 2022-01-07 DIAGNOSIS — N2581 Secondary hyperparathyroidism of renal origin: Secondary | ICD-10-CM | POA: Diagnosis not present

## 2022-01-09 DIAGNOSIS — Z992 Dependence on renal dialysis: Secondary | ICD-10-CM | POA: Diagnosis not present

## 2022-01-09 DIAGNOSIS — N186 End stage renal disease: Secondary | ICD-10-CM | POA: Diagnosis not present

## 2022-01-09 DIAGNOSIS — N2581 Secondary hyperparathyroidism of renal origin: Secondary | ICD-10-CM | POA: Diagnosis not present

## 2022-01-12 DIAGNOSIS — N186 End stage renal disease: Secondary | ICD-10-CM | POA: Diagnosis not present

## 2022-01-12 DIAGNOSIS — Z992 Dependence on renal dialysis: Secondary | ICD-10-CM | POA: Diagnosis not present

## 2022-01-12 DIAGNOSIS — N2581 Secondary hyperparathyroidism of renal origin: Secondary | ICD-10-CM | POA: Diagnosis not present

## 2022-01-14 DIAGNOSIS — N186 End stage renal disease: Secondary | ICD-10-CM | POA: Diagnosis not present

## 2022-01-14 DIAGNOSIS — N2581 Secondary hyperparathyroidism of renal origin: Secondary | ICD-10-CM | POA: Diagnosis not present

## 2022-01-14 DIAGNOSIS — Z992 Dependence on renal dialysis: Secondary | ICD-10-CM | POA: Diagnosis not present

## 2022-01-16 DIAGNOSIS — Z992 Dependence on renal dialysis: Secondary | ICD-10-CM | POA: Diagnosis not present

## 2022-01-16 DIAGNOSIS — N2581 Secondary hyperparathyroidism of renal origin: Secondary | ICD-10-CM | POA: Diagnosis not present

## 2022-01-16 DIAGNOSIS — N186 End stage renal disease: Secondary | ICD-10-CM | POA: Diagnosis not present

## 2022-01-19 DIAGNOSIS — N2581 Secondary hyperparathyroidism of renal origin: Secondary | ICD-10-CM | POA: Diagnosis not present

## 2022-01-19 DIAGNOSIS — Z992 Dependence on renal dialysis: Secondary | ICD-10-CM | POA: Diagnosis not present

## 2022-01-19 DIAGNOSIS — N186 End stage renal disease: Secondary | ICD-10-CM | POA: Diagnosis not present

## 2022-01-20 DIAGNOSIS — N186 End stage renal disease: Secondary | ICD-10-CM | POA: Diagnosis not present

## 2022-01-20 DIAGNOSIS — Z992 Dependence on renal dialysis: Secondary | ICD-10-CM | POA: Diagnosis not present

## 2022-01-20 DIAGNOSIS — I129 Hypertensive chronic kidney disease with stage 1 through stage 4 chronic kidney disease, or unspecified chronic kidney disease: Secondary | ICD-10-CM | POA: Diagnosis not present

## 2022-01-21 DIAGNOSIS — Z992 Dependence on renal dialysis: Secondary | ICD-10-CM | POA: Diagnosis not present

## 2022-01-21 DIAGNOSIS — N186 End stage renal disease: Secondary | ICD-10-CM | POA: Diagnosis not present

## 2022-01-21 DIAGNOSIS — N2581 Secondary hyperparathyroidism of renal origin: Secondary | ICD-10-CM | POA: Diagnosis not present

## 2022-01-23 DIAGNOSIS — Z992 Dependence on renal dialysis: Secondary | ICD-10-CM | POA: Diagnosis not present

## 2022-01-23 DIAGNOSIS — N2581 Secondary hyperparathyroidism of renal origin: Secondary | ICD-10-CM | POA: Diagnosis not present

## 2022-01-23 DIAGNOSIS — N186 End stage renal disease: Secondary | ICD-10-CM | POA: Diagnosis not present

## 2022-01-26 DIAGNOSIS — N186 End stage renal disease: Secondary | ICD-10-CM | POA: Diagnosis not present

## 2022-01-26 DIAGNOSIS — N2581 Secondary hyperparathyroidism of renal origin: Secondary | ICD-10-CM | POA: Diagnosis not present

## 2022-01-26 DIAGNOSIS — Z992 Dependence on renal dialysis: Secondary | ICD-10-CM | POA: Diagnosis not present

## 2022-01-28 DIAGNOSIS — N186 End stage renal disease: Secondary | ICD-10-CM | POA: Diagnosis not present

## 2022-01-28 DIAGNOSIS — Z992 Dependence on renal dialysis: Secondary | ICD-10-CM | POA: Diagnosis not present

## 2022-01-28 DIAGNOSIS — N2581 Secondary hyperparathyroidism of renal origin: Secondary | ICD-10-CM | POA: Diagnosis not present

## 2022-01-30 DIAGNOSIS — N2581 Secondary hyperparathyroidism of renal origin: Secondary | ICD-10-CM | POA: Diagnosis not present

## 2022-01-30 DIAGNOSIS — N186 End stage renal disease: Secondary | ICD-10-CM | POA: Diagnosis not present

## 2022-01-30 DIAGNOSIS — Z992 Dependence on renal dialysis: Secondary | ICD-10-CM | POA: Diagnosis not present

## 2022-02-02 DIAGNOSIS — N2581 Secondary hyperparathyroidism of renal origin: Secondary | ICD-10-CM | POA: Diagnosis not present

## 2022-02-02 DIAGNOSIS — Z992 Dependence on renal dialysis: Secondary | ICD-10-CM | POA: Diagnosis not present

## 2022-02-02 DIAGNOSIS — N186 End stage renal disease: Secondary | ICD-10-CM | POA: Diagnosis not present

## 2022-02-03 ENCOUNTER — Emergency Department (HOSPITAL_COMMUNITY): Payer: Medicare HMO

## 2022-02-03 ENCOUNTER — Other Ambulatory Visit: Payer: Self-pay

## 2022-02-03 ENCOUNTER — Emergency Department (HOSPITAL_COMMUNITY)
Admission: EM | Admit: 2022-02-03 | Discharge: 2022-02-04 | Discharge status: Against Medical Advice | Payer: Medicare HMO | Attending: Emergency Medicine | Admitting: Emergency Medicine

## 2022-02-03 ENCOUNTER — Encounter (HOSPITAL_COMMUNITY): Payer: Self-pay | Admitting: Emergency Medicine

## 2022-02-03 DIAGNOSIS — R079 Chest pain, unspecified: Secondary | ICD-10-CM | POA: Insufficient documentation

## 2022-02-03 DIAGNOSIS — Z5321 Procedure and treatment not carried out due to patient leaving prior to being seen by health care provider: Secondary | ICD-10-CM | POA: Insufficient documentation

## 2022-02-03 DIAGNOSIS — R0789 Other chest pain: Secondary | ICD-10-CM | POA: Diagnosis not present

## 2022-02-03 DIAGNOSIS — J9811 Atelectasis: Secondary | ICD-10-CM | POA: Diagnosis not present

## 2022-02-03 LAB — CBC WITH DIFFERENTIAL/PLATELET
Abs Immature Granulocytes: 0.01 10*3/uL (ref 0.00–0.07)
Basophils Absolute: 0 10*3/uL (ref 0.0–0.1)
Basophils Relative: 0 %
Eosinophils Absolute: 0.2 10*3/uL (ref 0.0–0.5)
Eosinophils Relative: 4 %
HCT: 35.6 % — ABNORMAL LOW (ref 39.0–52.0)
Hemoglobin: 11.3 g/dL — ABNORMAL LOW (ref 13.0–17.0)
Immature Granulocytes: 0 %
Lymphocytes Relative: 43 %
Lymphs Abs: 2.4 10*3/uL (ref 0.7–4.0)
MCH: 28.2 pg (ref 26.0–34.0)
MCHC: 31.7 g/dL (ref 30.0–36.0)
MCV: 88.8 fL (ref 80.0–100.0)
Monocytes Absolute: 0.4 10*3/uL (ref 0.1–1.0)
Monocytes Relative: 8 %
Neutro Abs: 2.6 10*3/uL (ref 1.7–7.7)
Neutrophils Relative %: 45 %
Platelets: 160 10*3/uL (ref 150–400)
RBC: 4.01 MIL/uL — ABNORMAL LOW (ref 4.22–5.81)
RDW: 14.6 % (ref 11.5–15.5)
WBC: 5.6 10*3/uL (ref 4.0–10.5)
nRBC: 0 % (ref 0.0–0.2)

## 2022-02-03 LAB — TROPONIN I (HIGH SENSITIVITY): Troponin I (High Sensitivity): 21 ng/L — ABNORMAL HIGH (ref <18)

## 2022-02-03 LAB — BASIC METABOLIC PANEL
Anion gap: 10 (ref 5–15)
BUN: 25 mg/dL — ABNORMAL HIGH (ref 8–23)
CO2: 30 mmol/L (ref 22–32)
Calcium: 9.1 mg/dL (ref 8.9–10.3)
Chloride: 91 mmol/L — ABNORMAL LOW (ref 98–111)
Creatinine, Ser: 8.9 mg/dL — ABNORMAL HIGH (ref 0.61–1.24)
GFR, Estimated: 6 mL/min — ABNORMAL LOW (ref >60)
Glucose, Bld: 122 mg/dL — ABNORMAL HIGH (ref 70–99)
Potassium: 3 mmol/L — ABNORMAL LOW (ref 3.5–5.1)
Sodium: 131 mmol/L — ABNORMAL LOW (ref 135–145)

## 2022-02-03 NOTE — ED Provider Triage Note (Signed)
Emergency Medicine Provider Triage Evaluation Note  Craig Flynn , a 61 y.o. male  was evaluated in triage.  Pt complains of chest pain x1 day.  Patient states chest pain radiates to abdomen and up to left side of neck.  Review of Systems  Positive: CP Negative: fever  Physical Exam  BP (!) 152/69 (BP Location: Right Arm)   Pulse 79   Temp 98.6 F (37 C) (Oral)   Resp 18   SpO2 100%  Gen:   Awake, no distress   Resp:  Normal effort  MSK:   Moves extremities without difficulty  Other:    Medical Decision Making  Medically screening exam initiated at 10:00 PM.  Appropriate orders placed.  Craig Flynn was informed that the remainder of the evaluation will be completed by another provider, this initial triage assessment does not replace that evaluation, and the importance of remaining in the ED until their evaluation is complete.  Cardiac labs   Craig Flynn 02/03/22 2201

## 2022-02-03 NOTE — ED Triage Notes (Signed)
Pt c/o intermittent central chest pain since yesterday. Pt states that tylenol has helped his chest pain, states that he has nitro but hasn't taken it.

## 2022-02-04 DIAGNOSIS — N186 End stage renal disease: Secondary | ICD-10-CM | POA: Diagnosis not present

## 2022-02-04 DIAGNOSIS — Z992 Dependence on renal dialysis: Secondary | ICD-10-CM | POA: Diagnosis not present

## 2022-02-04 DIAGNOSIS — N1831 Chronic kidney disease, stage 3a: Secondary | ICD-10-CM | POA: Insufficient documentation

## 2022-02-04 DIAGNOSIS — N2581 Secondary hyperparathyroidism of renal origin: Secondary | ICD-10-CM | POA: Diagnosis not present

## 2022-02-04 NOTE — Progress Notes (Deleted)
Cardiology Office Note   Date:  02/04/2022   ID:  Craig Flynn, DOB 01-Oct-1960, MRN [id number]  PCP:  Biagio Borg, MD  Cardiologist:   Minus Breeding, MD   No chief complaint on file.     History of Present Illness: Craig Flynn is a 61 y.o. male who presents for evaluation of cardiomyopathy and hypertension. The patient has long-standing hypertension. He was seen a couple of years ago with mildly reduced ejection fraction of about 45%. In July 2016 his EF was said to be about 55%.    Perfusion  study in 2017 demonstrated EF of 47% with a fixed inferior wall defect.   I sent him for a POET (Plain Old Exercise Treadmill) which was negative but he had a suboptimal heart rate response.   This was May of last year.  Echo in April of 2021 demonstrated an EF of 60 to 65%.  He was in the emergency room in September for chest pain.  He had a flat troponin that was minimally elevated.  It was thought to have costochondritis and was treated with prednisone taper along with topical nonsteroidals.  Since I last saw him  ***  ***  he has continued to have the sharp shooting chest discomfort sporadically in his mid chest.  He feels like there is are not.  He did not really improve with steroid taper or nonsteroidal cream.  However, its not reproducible with exertion.  He is not describing neck discomfort or arm discomfort but has had no new shortness of breath, PND or orthopnea.  He walks a mile a day with his son and is not a started golfing soon.  Past Medical History:  Diagnosis Date   Anemia 06/2015   Arthritis    knee   Cancer ([place])    thyroid cancer   Cardiomyopathy ([place])    a. 10/2013: EF reduced to 35-40% b. 09/2014: EF improved to 55-60%, Grade 1 DD noted.   Chest pain 06/2015   CHF (congestive heart failure) (HCC)    CKD (chronic kidney disease), stage IV [facility])    sees  Dr Lorrene Reid   Dyspnea    with exerion    GERD (gastroesophageal reflux disease)    Glaucoma    Gout     Hypertension    Obesity    Pancreatitis    Sleep apnea    not wearing c-pap now, needs new slep study per pt. (01/22/2017)   Tuberculosis 1981   while the pt was in the service.   Tubular adenoma of colon 10/2015    Past Surgical History:  Procedure Laterality Date   AV FISTULA PLACEMENT Left 03/25/2016   Procedure: Creation of Left Arm ARTERIOVENOUS (AV) FISTULA;  Surgeon: Elam Dutch, MD;  Location: [facility] OR;  Service: Vascular;  Laterality: Left;   AV FISTULA PLACEMENT Left 08/27/2020   Procedure: LEFT BASILIC VEIN ARTERIOVENOUS (AV) FISTULA CREATION;  Surgeon: Rosetta Posner, MD;  Location: [place];  Service: Vascular;  Laterality: Left;   [place] Left 10/22/2020   Procedure: LEFT SECOND STAGE BASILIC VEIN TRANSPOSITION;  Surgeon: Elam Dutch, MD;  Location: [place];  Service: Vascular;  Laterality: Left;   COLONOSCOPY W/ BIOPSIES AND POLYPECTOMY     "no problem"   HERNIA REPAIR     IR FLUORO GUIDE CV LINE RIGHT  08/22/2020   IR US GUIDE VASC ACCESS RIGHT  08/22/2020   LAPAROSCOPIC CHOLECYSTECTOMY  LIGATION OF COMPETING BRANCHES OF ARTERIOVENOUS FISTULA Left 01/25/2018   Procedure: LIGATION OF COMPETING BRANCHES And Revision of ARTERIOVENOUS FISTULA LEFT ARM.;  Surgeon: Elam Dutch, MD;  Location: [place];  Service: Vascular;  Laterality: Left;   THYROIDECTOMY  01/22/2017   THYROIDECTOMY Left 01/22/2017   Procedure: THYROIDECTOMY;  Surgeon: Melida Quitter, MD;  Location: [place];  Service: ENT;  Laterality: Left;   THYROIDECTOMY Right 03/26/2017   Procedure: COMPLETION OF THYROIDECTOMY;  Surgeon: Melida Quitter, MD;  Location: [place];  Service: ENT;  Laterality: Right;   UMBILICAL HERNIA REPAIR     WISDOM TOOTH EXTRACTION       Current Outpatient Medications  Medication Sig Dispense Refill   acetaminophen (TYLENOL) 500 MG tablet Take 1,000 mg by mouth every 6 (six) hours as needed for moderate pain.     albuterol (VENTOLIN HFA) 108 (90 Base) MCG/ACT inhaler INHALE  2 PUFFS INTO THE LUNGS EVERY 6 HOURS AS NEEDED FOR WHEEZING OR SHORTNESS OF BREATH (Patient taking differently: Inhale 2 puffs into the lungs every 6 (six) hours as needed for wheezing or shortness of breath.) 18 g 5   aspirin EC 81 MG tablet Take 81 mg by mouth daily.     carvedilol (COREG) 25 MG tablet TAKE 1 TABLET(25 MG) BY MOUTH TWICE DAILY 180 tablet 2   diazepam (VALIUM) 5 MG tablet 1 tab by mouth 45 min prior to MRI 1 tablet 0   fenofibrate 54 MG tablet TAKE 1 TABLET BY MOUTH EVERY DAY 60 tablet 0   gabapentin (NEURONTIN) 300 MG capsule Take 1 capsule (300 mg total) by mouth at bedtime. 90 capsule 1   isosorbide mononitrate (IMDUR) 30 MG 24 hr tablet TAKE 2 TABLETS BY MOUTH DAILY. SCHEDULE OFFICE VISIT FOR FUTURE REFILLS 180 tablet 3   ketoconazole (NIZORAL) 2 % cream Apply 1 application topically daily. 60 g 2   latanoprost (XALATAN) 0.005 % ophthalmic solution Place 1 drop into the left eye at bedtime.     lidocaine-prilocaine (EMLA) cream Apply 1 application topically 3 (three) times a week. MWF     meclizine (ANTIVERT) 25 MG tablet Take 1 tablet (25 mg total) by mouth 3 (three) times daily as needed for dizziness. 40 tablet 2   midodrine (PROAMATINE) 2.5 MG tablet Take 1 tablet (2.5 mg total) by mouth 3 (three) times daily with meals. 90 tablet 2   nitroGLYCERIN (NITROSTAT) 0.4 MG SL tablet DISSOLVE 1 TABLET UNDER THE TONGUE EVERY 5 MINUTES AS NEEDED FOR CHEST PAIN (Patient taking differently: Place 0.4 mg under the tongue every 5 (five) minutes as needed for chest pain.) 25 tablet 3   oxyCODONE-acetaminophen (PERCOCET/ROXICET) 5-325 MG tablet Take 1 tablet by mouth every 6 (six) hours as needed for severe pain. 15 tablet 0   pantoprazole (PROTONIX) 40 MG tablet Take 1 tablet (40 mg total) by mouth daily. 90 tablet 3   Potassium Chloride ER 20 MEQ TBCR Take 1 tablet by mouth daily.     thiamine (VITAMIN B-1) 50 MG tablet Take 1 tablet (50 mg total) by mouth daily. 90 tablet 1    tiZANidine (ZANAFLEX) 2 MG tablet TAKE 1 TABLET(2 MG) BY MOUTH DAILY 30 tablet 0   vitamin B-12 (CYANOCOBALAMIN) 1000 MCG tablet Take 1,000 mcg by mouth daily.     No current facility-administered medications for this visit.    Allergies:   Patient has no known allergies.   ROS:  Please see the history of present illness.   Otherwise, review  of systems are positive for ***.   All other systems are reviewed and negative.    PHYSICAL EXAM: VS:  There were no vitals taken for this visit. , BMI There is no height or weight on file to calculate BMI.  GENERAL:  Well appearing NECK:  No jugular venous distention, waveform within normal limits, carotid upstroke brisk and symmetric, no bruits, no thyromegaly LUNGS:  Clear to auscultation bilaterally CHEST:  Unremarkable HEART:  PMI not displaced or sustained,S1 and S2 within normal limits, no S3, no S4, no clicks, no rubs, *** murmurs ABD:  Flat, positive bowel sounds normal in frequency in pitch, no bruits, no rebound, no guarding, no midline pulsatile mass, no hepatomegaly, no splenomegaly EXT:  2 plus pulses throughout, no edema, no cyanosis no clubbing    ***GENERAL:  Well appearing NECK:  No jugular venous distention, waveform within normal limits, carotid upstroke brisk and symmetric, no bruits, no thyromegaly LUNGS:  Clear to auscultation bilaterally CHEST:  Unremarkable HEART:  PMI not displaced or sustained,S1 and S2 within normal limits, no S3, no S4, no clicks, no rubs, no murmurs ABD:  Flat, positive bowel sounds normal in frequency in pitch, no bruits, no rebound, no guarding, no midline pulsatile mass, no hepatomegaly, no splenomegaly EXT:  2 plus pulses throughout, no edema, no cyanosis no clubbing, left arm fistula and bruit  EKG:  EKG is ***ordered today. Sinus rhythm, rate ***, axis within normal limits, intervals within normal limits, no acute ST-T wave changes.  Recent Labs: 06/30/2021: Magnesium 1.7 10/19/2021: ALT  10 02/03/2022: BUN 25; Creatinine, Ser 8.90; Hemoglobin 11.3; Platelets 160; Potassium 3.0; Sodium 131    Lipid Panel    Component Value Date/Time   CHOL 132 06/07/2020 1203   TRIG 162 (H) 08/21/2020 2241   TRIG 98 03/03/2006 0910   HDL 30.40 (L) 06/07/2020 1203   CHOLHDL 4 06/07/2020 1203   VLDL 29.4 06/07/2020 1203   LDLCALC 72 06/07/2020 1203      Wt Readings from Last 3 Encounters:  11/07/21 262 lb (118.8 kg)  10/19/21 259 lb (117.5 kg)  09/17/21 259 lb (117.5 kg)      Other studies Reviewed: Additional studies/ records that were reviewed today include: *** Review of the above records demonstrates: See elsewhere   ASSESSMENT AND PLAN:   CARDIOMYOPATHY:   EF was normal in April 2021.  ***  No change in therapy or further imaging is indicated.  HTN:   The blood pressure is *** at target.  No change in therapy.   OBESITY:  ***  He has maintained his weight loss and I encouraged more of the same.   CKD:   ***   He tolerates dialysis.   CHEST PAIN:    ***  This is nonanginal.  No further cardiac work-up.    Current medicines are reviewed at length with the patient today.  The patient does not have concerns regarding medicines.  The following changes have Labs/ tests ordered today include:  None  No orders of the defined types were placed in this encounter.     Disposition:   FU with me in 1 year   Signed, Minus Breeding, MD  02/04/2022 7:56 PM    [facility]

## 2022-02-05 ENCOUNTER — Ambulatory Visit: Payer: Medicare HMO | Attending: Cardiology | Admitting: Cardiology

## 2022-02-05 DIAGNOSIS — I420 Dilated cardiomyopathy: Secondary | ICD-10-CM

## 2022-02-05 DIAGNOSIS — N1831 Chronic kidney disease, stage 3a: Secondary | ICD-10-CM

## 2022-02-05 DIAGNOSIS — I10 Essential (primary) hypertension: Secondary | ICD-10-CM

## 2022-02-06 DIAGNOSIS — N2581 Secondary hyperparathyroidism of renal origin: Secondary | ICD-10-CM | POA: Diagnosis not present

## 2022-02-06 DIAGNOSIS — N186 End stage renal disease: Secondary | ICD-10-CM | POA: Diagnosis not present

## 2022-02-06 DIAGNOSIS — Z992 Dependence on renal dialysis: Secondary | ICD-10-CM | POA: Diagnosis not present

## 2022-02-08 DIAGNOSIS — N186 End stage renal disease: Secondary | ICD-10-CM | POA: Diagnosis not present

## 2022-02-08 DIAGNOSIS — N2581 Secondary hyperparathyroidism of renal origin: Secondary | ICD-10-CM | POA: Diagnosis not present

## 2022-02-08 DIAGNOSIS — Z992 Dependence on renal dialysis: Secondary | ICD-10-CM | POA: Diagnosis not present

## 2022-02-10 DIAGNOSIS — Z992 Dependence on renal dialysis: Secondary | ICD-10-CM | POA: Diagnosis not present

## 2022-02-10 DIAGNOSIS — N2581 Secondary hyperparathyroidism of renal origin: Secondary | ICD-10-CM | POA: Diagnosis not present

## 2022-02-10 DIAGNOSIS — N186 End stage renal disease: Secondary | ICD-10-CM | POA: Diagnosis not present

## 2022-02-13 DIAGNOSIS — N186 End stage renal disease: Secondary | ICD-10-CM | POA: Diagnosis not present

## 2022-02-13 DIAGNOSIS — Z992 Dependence on renal dialysis: Secondary | ICD-10-CM | POA: Diagnosis not present

## 2022-02-13 DIAGNOSIS — N2581 Secondary hyperparathyroidism of renal origin: Secondary | ICD-10-CM | POA: Diagnosis not present

## 2022-02-15 NOTE — Progress Notes (Deleted)
Cardiology Office Note   Date:  02/15/2022   ID:  Craig Flynn, DOB 1960/11/12, MRN [id number]  PCP:  Biagio Borg, MD  Cardiologist:   Minus Breeding, MD   No chief complaint on file.     History of Present Illness: Craig Flynn is a 61 y.o. male who presents for evaluation of cardiomyopathy and hypertension. The patient has long-standing hypertension. He was seen a couple of years ago with mildly reduced ejection fraction of about 45%. In July 2016 his EF was said to be about 55%.    Perfusion  study in 2017 demonstrated EF of 47% with a fixed inferior wall defect.   I sent him for a POET (Plain Old Exercise Treadmill) which was negative but he had a suboptimal heart rate response.   This was May of last year.  Echo in April of 2021 demonstrated an EF of 60 to 65%.  He was in the emergency room in September for chest pain.  He had a flat troponin that was minimally elevated.  It was thought to have costochondritis and was treated with prednisone taper along with topical nonsteroidals.  Since I last saw him  ***  ***  he has continued to have the sharp shooting chest discomfort sporadically in his mid chest.  He feels like there is are not.  He did not really improve with steroid taper or nonsteroidal cream.  However, its not reproducible with exertion.  He is not describing neck discomfort or arm discomfort but has had no new shortness of breath, PND or orthopnea.  He walks a mile a day with his son and is not a started golfing soon.  Past Medical History:  Diagnosis Date   Anemia 06/2015   Arthritis    knee   Cancer ([place])    thyroid cancer   Cardiomyopathy ([place])    a. 10/2013: EF reduced to 35-40% b. 09/2014: EF improved to 55-60%, Grade 1 DD noted.   Chest pain 06/2015   CHF (congestive heart failure) (HCC)    CKD (chronic kidney disease), stage IV [facility])    sees  Dr Lorrene Reid   Dyspnea    with exerion    GERD (gastroesophageal reflux disease)    Glaucoma    Gout     Hypertension    Obesity    Pancreatitis    Sleep apnea    not wearing c-pap now, needs new slep study per pt. (01/22/2017)   Tuberculosis 1981   while the pt was in the service.   Tubular adenoma of colon 10/2015    Past Surgical History:  Procedure Laterality Date   AV FISTULA PLACEMENT Left 03/25/2016   Procedure: Creation of Left Arm ARTERIOVENOUS (AV) FISTULA;  Surgeon: Elam Dutch, MD;  Location: [facility] OR;  Service: Vascular;  Laterality: Left;   AV FISTULA PLACEMENT Left 08/27/2020   Procedure: LEFT BASILIC VEIN ARTERIOVENOUS (AV) FISTULA CREATION;  Surgeon: Rosetta Posner, MD;  Location: [place];  Service: Vascular;  Laterality: Left;   [place] Left 10/22/2020   Procedure: LEFT SECOND STAGE BASILIC VEIN TRANSPOSITION;  Surgeon: Elam Dutch, MD;  Location: [place];  Service: Vascular;  Laterality: Left;   COLONOSCOPY W/ BIOPSIES AND POLYPECTOMY     "no problem"   HERNIA REPAIR     IR FLUORO GUIDE CV LINE RIGHT  08/22/2020   IR US GUIDE VASC ACCESS RIGHT  08/22/2020   LAPAROSCOPIC CHOLECYSTECTOMY  LIGATION OF COMPETING BRANCHES OF ARTERIOVENOUS FISTULA Left 01/25/2018   Procedure: LIGATION OF COMPETING BRANCHES And Revision of ARTERIOVENOUS FISTULA LEFT ARM.;  Surgeon: Elam Dutch, MD;  Location: [place];  Service: Vascular;  Laterality: Left;   THYROIDECTOMY  01/22/2017   THYROIDECTOMY Left 01/22/2017   Procedure: THYROIDECTOMY;  Surgeon: Melida Quitter, MD;  Location: [place];  Service: ENT;  Laterality: Left;   THYROIDECTOMY Right 03/26/2017   Procedure: COMPLETION OF THYROIDECTOMY;  Surgeon: Melida Quitter, MD;  Location: [place];  Service: ENT;  Laterality: Right;   UMBILICAL HERNIA REPAIR     WISDOM TOOTH EXTRACTION       Current Outpatient Medications  Medication Sig Dispense Refill   acetaminophen (TYLENOL) 500 MG tablet Take 1,000 mg by mouth every 6 (six) hours as needed for moderate pain.     albuterol (VENTOLIN HFA) 108 (90 Base) MCG/ACT inhaler INHALE  2 PUFFS INTO THE LUNGS EVERY 6 HOURS AS NEEDED FOR WHEEZING OR SHORTNESS OF BREATH (Patient taking differently: Inhale 2 puffs into the lungs every 6 (six) hours as needed for wheezing or shortness of breath.) 18 g 5   aspirin EC 81 MG tablet Take 81 mg by mouth daily.     carvedilol (COREG) 25 MG tablet TAKE 1 TABLET(25 MG) BY MOUTH TWICE DAILY 180 tablet 2   diazepam (VALIUM) 5 MG tablet 1 tab by mouth 45 min prior to MRI 1 tablet 0   fenofibrate 54 MG tablet TAKE 1 TABLET BY MOUTH EVERY DAY 60 tablet 0   gabapentin (NEURONTIN) 300 MG capsule Take 1 capsule (300 mg total) by mouth at bedtime. 90 capsule 1   isosorbide mononitrate (IMDUR) 30 MG 24 hr tablet TAKE 2 TABLETS BY MOUTH DAILY. SCHEDULE OFFICE VISIT FOR FUTURE REFILLS 180 tablet 3   ketoconazole (NIZORAL) 2 % cream Apply 1 application topically daily. 60 g 2   latanoprost (XALATAN) 0.005 % ophthalmic solution Place 1 drop into the left eye at bedtime.     lidocaine-prilocaine (EMLA) cream Apply 1 application topically 3 (three) times a week. MWF     meclizine (ANTIVERT) 25 MG tablet Take 1 tablet (25 mg total) by mouth 3 (three) times daily as needed for dizziness. 40 tablet 2   midodrine (PROAMATINE) 2.5 MG tablet Take 1 tablet (2.5 mg total) by mouth 3 (three) times daily with meals. 90 tablet 2   nitroGLYCERIN (NITROSTAT) 0.4 MG SL tablet DISSOLVE 1 TABLET UNDER THE TONGUE EVERY 5 MINUTES AS NEEDED FOR CHEST PAIN (Patient taking differently: Place 0.4 mg under the tongue every 5 (five) minutes as needed for chest pain.) 25 tablet 3   oxyCODONE-acetaminophen (PERCOCET/ROXICET) 5-325 MG tablet Take 1 tablet by mouth every 6 (six) hours as needed for severe pain. 15 tablet 0   pantoprazole (PROTONIX) 40 MG tablet Take 1 tablet (40 mg total) by mouth daily. 90 tablet 3   Potassium Chloride ER 20 MEQ TBCR Take 1 tablet by mouth daily.     thiamine (VITAMIN B-1) 50 MG tablet Take 1 tablet (50 mg total) by mouth daily. 90 tablet 1    tiZANidine (ZANAFLEX) 2 MG tablet TAKE 1 TABLET(2 MG) BY MOUTH DAILY 30 tablet 0   vitamin B-12 (CYANOCOBALAMIN) 1000 MCG tablet Take 1,000 mcg by mouth daily.     No current facility-administered medications for this visit.    Allergies:   Patient has no known allergies.   ROS:  Please see the history of present illness.   Otherwise, review  of systems are positive for ***.   All other systems are reviewed and negative.    PHYSICAL EXAM: VS:  There were no vitals taken for this visit. , BMI There is no height or weight on file to calculate BMI.  GENERAL:  Well appearing NECK:  No jugular venous distention, waveform within normal limits, carotid upstroke brisk and symmetric, no bruits, no thyromegaly LUNGS:  Clear to auscultation bilaterally CHEST:  Unremarkable HEART:  PMI not displaced or sustained,S1 and S2 within normal limits, no S3, no S4, no clicks, no rubs, *** murmurs ABD:  Flat, positive bowel sounds normal in frequency in pitch, no bruits, no rebound, no guarding, no midline pulsatile mass, no hepatomegaly, no splenomegaly EXT:  2 plus pulses throughout, no edema, no cyanosis no clubbing    ***GENERAL:  Well appearing NECK:  No jugular venous distention, waveform within normal limits, carotid upstroke brisk and symmetric, no bruits, no thyromegaly LUNGS:  Clear to auscultation bilaterally CHEST:  Unremarkable HEART:  PMI not displaced or sustained,S1 and S2 within normal limits, no S3, no S4, no clicks, no rubs, no murmurs ABD:  Flat, positive bowel sounds normal in frequency in pitch, no bruits, no rebound, no guarding, no midline pulsatile mass, no hepatomegaly, no splenomegaly EXT:  2 plus pulses throughout, no edema, no cyanosis no clubbing, left arm fistula and bruit  EKG:  EKG is ***ordered today. Sinus rhythm, rate ***, axis within normal limits, intervals within normal limits, no acute ST-T wave changes.  Recent Labs: 06/30/2021: Magnesium 1.7 10/19/2021: ALT  10 02/03/2022: BUN 25; Creatinine, Ser 8.90; Hemoglobin 11.3; Platelets 160; Potassium 3.0; Sodium 131    Lipid Panel    Component Value Date/Time   CHOL 132 06/07/2020 1203   TRIG 162 (H) 08/21/2020 2241   TRIG 98 03/03/2006 0910   HDL 30.40 (L) 06/07/2020 1203   CHOLHDL 4 06/07/2020 1203   VLDL 29.4 06/07/2020 1203   LDLCALC 72 06/07/2020 1203      Wt Readings from Last 3 Encounters:  11/07/21 262 lb (118.8 kg)  10/19/21 259 lb (117.5 kg)  09/17/21 259 lb (117.5 kg)      Other studies Reviewed: Additional studies/ records that were reviewed today include: *** Review of the above records demonstrates: See elsewhere   ASSESSMENT AND PLAN:   CARDIOMYOPATHY:   EF was normal in April 2021.  ***  No change in therapy or further imaging is indicated.  HTN:   The blood pressure is *** at target.  No change in therapy.   OBESITY:  ***  He has maintained his weight loss and I encouraged more of the same.   CKD:   ***   He tolerates dialysis.   CHEST PAIN:    ***  This is nonanginal.  No further cardiac work-up.    Current medicines are reviewed at length with the patient today.  The patient does not have concerns regarding medicines.  The following changes have Labs/ tests ordered today include:  None  No orders of the defined types were placed in this encounter.     Disposition:   FU with me in 1 year   Signed, Minus Breeding, MD  02/15/2022 12:06 PM    [place]

## 2022-02-16 DIAGNOSIS — Z992 Dependence on renal dialysis: Secondary | ICD-10-CM | POA: Diagnosis not present

## 2022-02-16 DIAGNOSIS — N2581 Secondary hyperparathyroidism of renal origin: Secondary | ICD-10-CM | POA: Diagnosis not present

## 2022-02-16 DIAGNOSIS — N186 End stage renal disease: Secondary | ICD-10-CM | POA: Diagnosis not present

## 2022-02-17 ENCOUNTER — Ambulatory Visit: Payer: Medicare HMO | Attending: Cardiology | Admitting: Cardiology

## 2022-02-17 DIAGNOSIS — R072 Precordial pain: Secondary | ICD-10-CM

## 2022-02-17 DIAGNOSIS — I5032 Chronic diastolic (congestive) heart failure: Secondary | ICD-10-CM

## 2022-02-17 DIAGNOSIS — I10 Essential (primary) hypertension: Secondary | ICD-10-CM

## 2022-02-17 DIAGNOSIS — N185 Chronic kidney disease, stage 5: Secondary | ICD-10-CM

## 2022-02-18 DIAGNOSIS — N2581 Secondary hyperparathyroidism of renal origin: Secondary | ICD-10-CM | POA: Diagnosis not present

## 2022-02-18 DIAGNOSIS — Z992 Dependence on renal dialysis: Secondary | ICD-10-CM | POA: Diagnosis not present

## 2022-02-18 DIAGNOSIS — N186 End stage renal disease: Secondary | ICD-10-CM | POA: Diagnosis not present

## 2022-02-19 DIAGNOSIS — Z992 Dependence on renal dialysis: Secondary | ICD-10-CM | POA: Diagnosis not present

## 2022-02-19 DIAGNOSIS — N186 End stage renal disease: Secondary | ICD-10-CM | POA: Diagnosis not present

## 2022-02-19 DIAGNOSIS — I129 Hypertensive chronic kidney disease with stage 1 through stage 4 chronic kidney disease, or unspecified chronic kidney disease: Secondary | ICD-10-CM | POA: Diagnosis not present

## 2022-02-20 ENCOUNTER — Encounter (HOSPITAL_COMMUNITY): Payer: Self-pay

## 2022-02-20 ENCOUNTER — Ambulatory Visit (INDEPENDENT_AMBULATORY_CARE_PROVIDER_SITE_OTHER): Payer: Medicare HMO

## 2022-02-20 ENCOUNTER — Ambulatory Visit (HOSPITAL_COMMUNITY)
Admission: EM | Admit: 2022-02-20 | Discharge: 2022-02-20 | Disposition: A | Discharge status: Home / Self Care | Payer: Medicare HMO | Attending: Physician Assistant | Admitting: Physician Assistant

## 2022-02-20 DIAGNOSIS — Z85850 Personal history of malignant neoplasm of thyroid: Secondary | ICD-10-CM | POA: Diagnosis not present

## 2022-02-20 DIAGNOSIS — N2581 Secondary hyperparathyroidism of renal origin: Secondary | ICD-10-CM | POA: Diagnosis not present

## 2022-02-20 DIAGNOSIS — M542 Cervicalgia: Secondary | ICD-10-CM | POA: Diagnosis not present

## 2022-02-20 DIAGNOSIS — M5030 Other cervical disc degeneration, unspecified cervical region: Secondary | ICD-10-CM | POA: Diagnosis not present

## 2022-02-20 DIAGNOSIS — N186 End stage renal disease: Secondary | ICD-10-CM | POA: Diagnosis not present

## 2022-02-20 DIAGNOSIS — Z992 Dependence on renal dialysis: Secondary | ICD-10-CM | POA: Diagnosis not present

## 2022-02-20 DIAGNOSIS — M47812 Spondylosis without myelopathy or radiculopathy, cervical region: Secondary | ICD-10-CM | POA: Diagnosis not present

## 2022-02-20 MED ORDER — TIZANIDINE HCL 2 MG PO TABS: 2.0000 mg | ORAL_TABLET | Freq: Every day | ORAL | 0 refills | Status: DC

## 2022-02-20 MED ORDER — BACLOFEN 10 MG PO TABS: 10.0000 mg | ORAL_TABLET | Freq: Two times a day (BID) | ORAL | 0 refills | Status: DC

## 2022-02-20 NOTE — ED Triage Notes (Signed)
Pt c/o of right side neck pain x 1 week. Pt is taking Motrin for pain.

## 2022-02-20 NOTE — ED Provider Notes (Signed)
[facility]    CSN: [id number] Arrival date & time: 02/20/22  1032      History   Chief Complaint Chief Complaint  Patient presents with   Neck Pain    HPI Craig Flynn is a 61 y.o. male.   Patient presents today with a 1 week history of right-sided neck pain.  Reports that this begins in his anterior neck and then spreads along to his posterior neck and up towards his occiput.  He denies any known injury or increase in activity prior to symptom onset.  Denies previous history of injury or surgery involving his neck.  He does report occasional numbness and paresthesias of his hands but this is chronic and unchanged from baseline.  He is right-handed.  He does have a history of thyroid cancer which was treated with thyroidectomy many years ago.  He has tried Tylenol without improvement of symptoms.  Reports that pain is worse with certain movements.  It is currently rated 6/7 on a 0-10 pain scale, described as aching, no alleviating factors identified.    Past Medical History:  Diagnosis Date   Anemia 06/2015   Arthritis    knee   Cancer ([place])    thyroid cancer   Cardiomyopathy ([place])    a. 10/2013: EF reduced to 35-40% b. 09/2014: EF improved to 55-60%, Grade 1 DD noted.   Chest pain 06/2015   CHF (congestive heart failure) (HCC)    CKD (chronic kidney disease), stage IV [facility])    sees  Dr Lorrene Reid   Dyspnea    with exerion    GERD (gastroesophageal reflux disease)    Glaucoma    Gout    Hypertension    Obesity    Pancreatitis    Sleep apnea    not wearing c-pap now, needs new slep study per pt. (01/22/2017)   Tuberculosis 1981   while the pt was in the service.   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   Pain, unspecified 01/13/2021   Mild protein-calorie malnutrition ([place]) 10/07/2020   Allergy, unspecified, initial encounter 08/28/2020   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/28/2020   Cardiomyopathy, unspecified ([place]) 89/16/9450   Complication of vascular dialysis catheter 08/28/2020   Other specified coagulation defects ([place]) 08/28/2020   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   Acute pancreatitis 08/22/2020   B12 deficiency 06/08/2020   Gynecomastia, male 06/07/2020   Erectile dysfunction 06/07/2020   Hand cramp 06/07/2020   Aortic atherosclerosis ([place]) 06/07/2020   Anemia due to acquired thiamine deficiency 38/88/2800   Umbilical hernia without obstruction and without gangrene 03/18/2020   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   Deficiency anemia 03/18/2020   Hyperlipidemia LDL goal <130 03/18/2020   Upper back pain on left side 02/06/2020   History of TB (tuberculosis) 10/30/2019   Sinusitis 10/16/2019   Allergic rhinitis 10/16/2019   CAD (coronary artery disease) 09/07/2019   Nephrosclerosis 09/07/2019   Obesity (BMI 30-39.9) 09/07/2019   Secondary hyperparathyroidism ([place]) 09/07/2019   CKD (chronic kidney disease), stage V ([place]) 06/22/2019   Obesity, diabetes, 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   Ulnar neuritis, right 04/25/2019   Muscle  cramps 02/26/2019   Abnormal TSH 02/26/2019   Vitamin D deficiency 02/22/2019   Iron deficiency 02/22/2019   Chest pain 11/01/2018   Laryngopharyngeal reflux (LPR) 10/04/2018   Post-nasal drainage 10/04/2018   Whiplash injuries, initial encounter 09/21/2018   Degenerative arthritis of right knee 09/21/2018   Chronic diastolic HF (heart failure) ([place]) 07/04/2018   Intractable post-traumatic headache 06/16/2018   Low back pain 06/15/2018   Neck pain 76/54/6503   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   Thyroid nodule 01/22/2017   Ptosis  54/65/6812   Chronic systolic heart failure ([place]) 08/01/2015   GERD (gastroesophageal reflux disease) 07/29/2015   Osteoarthritis 75/17/0017   Diastolic dysfunction 49/44/9675   Tobacco abuse 02/26/2015   OSA (obstructive sleep apnea) 12/27/2014   Encounter for well adult exam with abnormal findings 11/12/2014   Anemia in chronic kidney disease 11/12/2014   Left knee pain 09/22/2014   Paresthesia of left leg 91/63/8466   Acute systolic CHF (congestive heart failure) ([place]) 11/17/2013   Morbid obesity ([place]) 11/17/2013   Hyperglycemia 12/12/2011   Acute on chronic renal failure ([place]) 12/11/2011   HTN (hypertension) 12/11/2011   SOB (shortness of breath) 12/11/2011   Gout 12/31/2006   HERNIATED LUMBAR DISK WITH RADICULOPATHY 12/30/2006    Past Surgical History:  Procedure Laterality Date   AV FISTULA PLACEMENT Left 03/25/2016   Procedure: Creation of Left Arm ARTERIOVENOUS (AV) FISTULA;  Surgeon: Elam Dutch, MD;  Location: [place];  Service: Vascular;  Laterality: Left;   AV FISTULA PLACEMENT Left 08/27/2020   Procedure: LEFT BASILIC VEIN ARTERIOVENOUS (AV) FISTULA CREATION;  Surgeon: Rosetta Posner, MD;  Location: [place];  Service: Vascular;  Laterality: Left;   [place] Left 10/22/2020   Procedure: LEFT SECOND STAGE BASILIC VEIN TRANSPOSITION;  Surgeon: Elam Dutch, MD;  Location: [place];  Service: Vascular;  Laterality: Left;   COLONOSCOPY W/ BIOPSIES AND POLYPECTOMY     "no problem"   HERNIA REPAIR     IR FLUORO GUIDE CV LINE RIGHT  08/22/2020   IR US GUIDE VASC ACCESS RIGHT  08/22/2020   LAPAROSCOPIC CHOLECYSTECTOMY     LIGATION OF COMPETING BRANCHES OF ARTERIOVENOUS FISTULA Left 01/25/2018   Procedure: LIGATION OF COMPETING BRANCHES And Revision of ARTERIOVENOUS FISTULA LEFT ARM.;  Surgeon: Elam Dutch, MD;  Location: [place];  Service: Vascular;  Laterality: Left;   THYROIDECTOMY  01/22/2017   THYROIDECTOMY Left 01/22/2017   Procedure: THYROIDECTOMY;   Surgeon: Melida Quitter, MD;  Location: [facility];  Service: ENT;  Laterality: Left;   THYROIDECTOMY Right 03/26/2017   Procedure: COMPLETION OF THYROIDECTOMY;  Surgeon: Melida Quitter, MD;  Location: [place];  Service: ENT;  Laterality: Right;   UMBILICAL HERNIA REPAIR     WISDOM TOOTH EXTRACTION         Home Medications    Prior to Admission medications   Medication Sig Start Date End Date Taking? Authorizing Provider  tiZANidine (ZANAFLEX) 2 MG tablet Take 1 tablet (2 mg total) by mouth daily. 02/20/22  Yes Addalynn Kumari, Derry Skill, PA-C  acetaminophen (TYLENOL) 500 MG tablet Take 1,000 mg by mouth every 6 (six) hours as needed for moderate pain.    [provider]  albuterol (VENTOLIN HFA) 108 (90 Base) MCG/ACT inhaler INHALE 2 PUFFS INTO THE LUNGS EVERY 6 HOURS AS NEEDED FOR WHEEZING OR SHORTNESS OF BREATH Patient taking differently: Inhale 2 puffs into the lungs every 6 (six) hours as needed for wheezing or shortness of breath. 08/01/20  Biagio Borg, MD  aspirin EC 81 MG tablet Take 81 mg by mouth daily.    [provider]  carvedilol (COREG) 25 MG tablet TAKE 1 TABLET(25 MG) BY MOUTH TWICE DAILY 12/23/21   Biagio Borg, MD  diazepam (VALIUM) 5 MG tablet 1 tab by mouth 45 min prior to MRI 05/23/21   Biagio Borg, MD  fenofibrate 54 MG tablet TAKE 1 TABLET BY MOUTH EVERY DAY 08/07/20   Minus Breeding, MD  gabapentin (NEURONTIN) 300 MG capsule Take 1 capsule (300 mg total) by mouth at bedtime. 09/17/21   Biagio Borg, MD  isosorbide mononitrate (IMDUR) 30 MG 24 hr tablet TAKE 2 TABLETS BY MOUTH DAILY. SCHEDULE OFFICE VISIT FOR FUTURE REFILLS 02/05/21   Minus Breeding, MD  ketoconazole (NIZORAL) 2 % cream Apply 1 application topically daily. 02/11/21   Lorenda Peck, DPM  latanoprost (XALATAN) 0.005 % ophthalmic solution Place 1 drop into the left eye at bedtime. 05/08/21   [provider]  lidocaine-prilocaine (EMLA) cream Apply 1 application topically 3 (three) times a week.  MWF 11/14/20   [provider]  meclizine (ANTIVERT) 25 MG tablet Take 1 tablet (25 mg total) by mouth 3 (three) times daily as needed for dizziness. 11/07/21   Biagio Borg, MD  midodrine (PROAMATINE) 2.5 MG tablet Take 1 tablet (2.5 mg total) by mouth 3 (three) times daily with meals. 11/07/21   Biagio Borg, MD  nitroGLYCERIN (NITROSTAT) 0.4 MG SL tablet DISSOLVE 1 TABLET UNDER THE TONGUE EVERY 5 MINUTES AS NEEDED FOR CHEST PAIN Patient taking differently: Place 0.4 mg under the tongue every 5 (five) minutes as needed for chest pain. 03/29/19   Tami Lin, MD  pantoprazole (PROTONIX) 40 MG tablet Take 1 tablet (40 mg total) by mouth daily. 10/21/21   Biagio Borg, MD  Potassium Chloride ER 20 MEQ TBCR Take 1 tablet by mouth daily. 11/26/20   [provider]  thiamine (VITAMIN B-1) 50 MG tablet Take 1 tablet (50 mg total) by mouth daily. 03/25/20   Janith Lima, MD  vitamin B-12 (CYANOCOBALAMIN) 1000 MCG tablet Take 1,000 mcg by mouth daily.    [provider]    Family History Family History  Problem Relation Age of Onset   Diabetes Mother    Stroke Mother    Pneumonia Father        died of Pneumonia x3   Kidney disease Maternal Grandmother    Hypertension Maternal Grandmother    Healthy Maternal Grandfather    Healthy Paternal Grandmother    Healthy Paternal Grandfather    CAD Neg Hx    Colon cancer Neg Hx    Esophageal cancer Neg Hx    Pancreatic cancer Neg Hx    Prostate cancer Neg Hx    Stomach cancer Neg Hx    Rectal cancer Neg Hx     Social History Social History   Tobacco Use   Smoking status: Every Day    Years: 40.00    Types: Cigarettes    Last attempt to quit: 05/06/2021    Years since quitting: 0.7   Smokeless tobacco: Never   Tobacco comments:    1/2 pack per day (03/25/2021)  Vaping Use   Vaping Use: Never used  Substance Use Topics   Alcohol use: Not Currently    Alcohol/week: 0.0 standard drinks of alcohol   Drug use:  No     Allergies   Patient has no known allergies.  Review of Systems Review of Systems  Constitutional:  Positive for activity change. Negative for appetite change, fatigue and fever.  HENT:  Negative for sore throat, trouble swallowing and voice change.   Gastrointestinal:  Negative for abdominal pain, diarrhea, nausea and vomiting.  Musculoskeletal:  Positive for neck pain. Negative for arthralgias and myalgias.  Neurological:  Negative for weakness and numbness.     Physical Exam Triage Vital Signs ED Triage Vitals [02/20/22 1202]  Enc Vitals Group     BP (!) 157/91     Pulse Rate 90     Resp 18     Temp 98.6 F (37 C)     Temp Source Oral     SpO2 100 %     Weight      Height      Head Circumference      Peak Flow      Pain Score      Pain Loc      Pain Edu?      Excl. in [place]?    No data found.  Updated Vital Signs BP (!) 157/91 (BP Location: Left Arm)   Pulse 90   Temp 98.6 F (37 C) (Oral)   Resp 18   SpO2 100%   Visual Acuity Right Eye Distance:   Left Eye Distance:   Bilateral Distance:    Right Eye Near:   Left Eye Near:    Bilateral Near:     Physical Exam Vitals reviewed.  Constitutional:      General: He is awake.     Appearance: Normal appearance. He is well-developed. He is not ill-appearing.     Comments: Very pleasant male appears stated age in no acute distress sitting comfortably in exam room  HENT:     Head: Normocephalic and atraumatic.     Right Ear: External ear normal.     Left Ear: External ear normal.     Nose: Nose normal.  Cardiovascular:     Rate and Rhythm: Normal rate and regular rhythm.     Heart sounds: Normal heart sounds, S1 normal and S2 normal. No murmur heard. Pulmonary:     Effort: Pulmonary effort is normal. No accessory muscle usage or respiratory distress.     Breath sounds: Normal breath sounds. No stridor. No wheezing, rhonchi or rales.     Comments: Clear to auscultation  bilaterally Musculoskeletal:     Cervical back: Neck supple. Spasms and tenderness present. No bony tenderness. Muscular tenderness present. No spinous process tenderness. Decreased range of motion.     Thoracic back: No tenderness or bony tenderness.     Lumbar back: No tenderness or bony tenderness.       Back:     Comments: Tenderness palpation of cervical paraspinal muscles.  No pain with percussion of vertebrae.  Spasm noted right trapezius.  Decreased range of motion with lateral flexion and rotation secondary to discomfort.  Lymphadenopathy:     Head:     Right side of head: No submental, submandibular or tonsillar adenopathy.     Left side of head: No submental, submandibular or tonsillar adenopathy.  Neurological:     Mental Status: He is alert.  Psychiatric:        Behavior: Behavior is cooperative.      UC Treatments / Results  Labs (all labs ordered are listed, but only abnormal results are displayed) Labs Reviewed - No data to display  EKG   Radiology DG Cervical Spine Complete  Result Date: 02/20/2022 CLINICAL DATA:  Right neck pain EXAM: CERVICAL SPINE - COMPLETE 4+ VIEW COMPARISON:  05/05/2018 FINDINGS: No recent fracture is seen. There is linear smooth marginated calcification at the tip of spinous process of C7 vertebra has not changed suggesting old avulsion or ligament calcification. Small smooth marginated calcifications adjacent to the anteroinferior aspects of bodies of C2 and C3 vertebrae have not changed. There is reversal of lordosis which was also seen in the previous study. Alignment of posterior margins of vertebral bodies is within normal limits. There is mild encroachment of right neural foramen at C4-C5 level. Left neural foramina are not optimally visualized due to less than optimal positioning. There is possible encroachment of neural foramina from C4-C7 levels on the left side. Prevertebral soft tissues are unremarkable. There is no significant  narrowing of the airways. IMPRESSION: No recent fracture is seen. Cervical spondylosis with encroachment of neural foramina as described in the body of the report. Electronically Signed   By: Elmer Picker M.D.   On: 02/20/2022 13:16    Procedures Procedures (including critical care time)  Medications Ordered in UC Medications - No data to display  Initial Impression / Assessment and Plan / UC Course  I have reviewed the triage vital signs and the nursing notes.  Pertinent labs & imaging results that were available during my care of the patient were reviewed by me and considered in my medical decision making (see chart for details).     Patient is well-appearing, afebrile, nontoxic, nontachycardic.  X-ray was obtained given history of thyroid cancer which showed degenerative changes without acute abnormalities.  Discussed x-ray findings of foraminal encroachment and recommend he follow-up with orthopedics.  He was given contact information for local provider with instruction to call to schedule an appointment.  He can continue Tylenol for pain.  He is unable to take NSAIDs due to chronic kidney disease.  Recommended he use over-the-counter medications such as IcyHot and topical lidocaine for symptom relief.  He was started on Zanaflex 2 milligrams daily given history of chronic kidney disease.  Discussed that it is sedating and he should not drive or drink alcohol with taking it.  He is to rest and drink plenty of fluid.  Recommended heat for additional symptom relief.  If he has any changing or worsening symptoms he needs to be seen immediately including increased pain, weakness, numbness, paresthesias.  Strict return precautions given.  Final Clinical Impressions(s) / UC Diagnoses   Final diagnoses:  DDD (degenerative disc disease), cervical  Neck pain  History of thyroid cancer     Discharge Instructions      Your x-ray showed arthritis which is putting pressure on some of your  nerves.  I believe this is contributing to your pain.  If you develop any severe pain, numbness, tingling, weakness in your arms need to go to the emergency room.  I recommend you follow-up with an orthopedic doctor.  Call them to schedule an appointment.  Continue Tylenol for pain.  Use Zanaflex once daily.  This make you sleepy so do not drive or drink alcohol while taking it.  You can use lidocaine patches and over-the-counter medications for additional symptom relief.     ED Prescriptions     Medication Sig Dispense Auth. Provider   baclofen (LIORESAL) 10 MG tablet  (Status: Discontinued) Take 1 tablet (10 mg total) by mouth 2 (two) times daily. 20 each Hill Mackie K, PA-C   tiZANidine (ZANAFLEX) 2 MG tablet Take  1 tablet (2 mg total) by mouth daily. 10 tablet Lyndi Holbein K, PA-C      I have reviewed the PDMP during this encounter.   Terrilee Croak, PA-C 02/20/22 1344

## 2022-02-20 NOTE — Discharge Instructions (Addendum)
Your x-ray showed arthritis which is putting pressure on some of your nerves.  I believe this is contributing to your pain.  If you develop any severe pain, numbness, tingling, weakness in your arms need to go to the emergency room.  I recommend you follow-up with an orthopedic doctor.  Call them to schedule an appointment.  Continue Tylenol for pain.  Use Zanaflex once daily.  This make you sleepy so do not drive or drink alcohol while taking it.  You can use lidocaine patches and over-the-counter medications for additional symptom relief.

## 2022-02-23 DIAGNOSIS — N186 End stage renal disease: Secondary | ICD-10-CM | POA: Diagnosis not present

## 2022-02-23 DIAGNOSIS — N2581 Secondary hyperparathyroidism of renal origin: Secondary | ICD-10-CM | POA: Diagnosis not present

## 2022-02-23 DIAGNOSIS — Z992 Dependence on renal dialysis: Secondary | ICD-10-CM | POA: Diagnosis not present

## 2022-02-25 ENCOUNTER — Ambulatory Visit (INDEPENDENT_AMBULATORY_CARE_PROVIDER_SITE_OTHER): Payer: Medicare HMO | Admitting: Internal Medicine

## 2022-02-25 ENCOUNTER — Encounter: Payer: Self-pay | Admitting: Internal Medicine

## 2022-02-25 VITALS — BP 138/76 | HR 76 | Temp 98.1°F | Ht 74.0 in | Wt 269.0 lb

## 2022-02-25 DIAGNOSIS — M79675 Pain in left toe(s): Secondary | ICD-10-CM | POA: Diagnosis not present

## 2022-02-25 DIAGNOSIS — E559 Vitamin D deficiency, unspecified: Secondary | ICD-10-CM | POA: Diagnosis not present

## 2022-02-25 DIAGNOSIS — I10 Essential (primary) hypertension: Secondary | ICD-10-CM | POA: Diagnosis not present

## 2022-02-25 DIAGNOSIS — M5412 Radiculopathy, cervical region: Secondary | ICD-10-CM | POA: Diagnosis not present

## 2022-02-25 DIAGNOSIS — M79674 Pain in right toe(s): Secondary | ICD-10-CM | POA: Diagnosis not present

## 2022-02-25 DIAGNOSIS — G8929 Other chronic pain: Secondary | ICD-10-CM | POA: Diagnosis not present

## 2022-02-25 DIAGNOSIS — N2581 Secondary hyperparathyroidism of renal origin: Secondary | ICD-10-CM | POA: Diagnosis not present

## 2022-02-25 DIAGNOSIS — N186 End stage renal disease: Secondary | ICD-10-CM | POA: Diagnosis not present

## 2022-02-25 DIAGNOSIS — Z992 Dependence on renal dialysis: Secondary | ICD-10-CM | POA: Diagnosis not present

## 2022-02-25 MED ORDER — IBUPROFEN 800 MG PO TABS
800.0000 mg | ORAL_TABLET | Freq: Three times a day (TID) | ORAL | 1 refills | Status: DC | PRN
Start: 2022-02-25 — End: 2023-09-15

## 2022-02-25 NOTE — Assessment & Plan Note (Signed)
Likely due to cockup toes, needs shoes with wider to box, for otc volt gel prn topical

## 2022-02-25 NOTE — Assessment & Plan Note (Addendum)
Recent prolonged worsening despite conservative tx - for MRI c spine, may need referral to NS or ortho or pain management

## 2022-02-25 NOTE — Progress Notes (Signed)
Patient ID: Craig Flynn, male   DOB: 1960-05-11, 61 y.o.   MRN: [id number]        Chief Complaint: follow up right neck pain, bilateral cockup toe pain, htn, low vit d       HPI:  Craig Flynn is a 61 y.o. male here with c/o 2 mo onset mostly constant right posterolateral neck with radiating pain to the right upper back to the shoulder, and also much of the right hemicranium, burning like, without trauma, fever, rash or swelling.  Worse to move neck to the right.   No other clear UE symptoms such as pain, numbness or weakness.  Better with ibuprofen '800mg'$  but now out of med.  Has had previous plain films with cervical spondylosis and bilateral potential neuroforaminal encroachment.  Also c/o pain to the bilateral 2nd and third cockup toes despite change of good shoes.  No trauma, fever, redness, swelling or ulcer.  Pt denies chest pain, increased sob or doe, wheezing, orthopnea, PND, increased LE swelling, palpitations, dizziness or syncope.   Pt denies polydipsia, polyuria, or new focal neuro s/s.          Wt Readings from Last 3 Encounters:  02/25/22 269 lb (122 kg)  11/07/21 262 lb (118.8 kg)  10/19/21 259 lb (117.5 kg)   BP Readings from Last 3 Encounters:  02/25/22 138/76  02/20/22 (!) 157/91  02/03/22 (!) 152/69         Past Medical History:  Diagnosis Date   Anemia 06/2015   Arthritis    knee   Cancer ([place])    thyroid cancer   Cardiomyopathy ([place])    a. 10/2013: EF reduced to 35-40% b. 09/2014: EF improved to 55-60%, Grade 1 DD noted.   Chest pain 06/2015   CHF (congestive heart failure) (HCC)    CKD (chronic kidney disease), stage IV [facility])    sees  Dr Lorrene Reid   Dyspnea    with exerion    GERD (gastroesophageal reflux disease)    Glaucoma    Gout    Hypertension    Obesity    Pancreatitis    Sleep apnea    not wearing c-pap now, needs new slep study per pt. (01/22/2017)   Tuberculosis 1981   while the pt was in the service.   Tubular adenoma of colon 10/2015    Past Surgical History:  Procedure Laterality Date   AV FISTULA PLACEMENT Left 03/25/2016   Procedure: Creation of Left Arm ARTERIOVENOUS (AV) FISTULA;  Surgeon: Elam Dutch, MD;  Location: [facility] OR;  Service: Vascular;  Laterality: Left;   AV FISTULA PLACEMENT Left 08/27/2020   Procedure: LEFT BASILIC VEIN ARTERIOVENOUS (AV) FISTULA CREATION;  Surgeon: Rosetta Posner, MD;  Location: [place];  Service: Vascular;  Laterality: Left;   [place] Left 10/22/2020   Procedure: LEFT SECOND STAGE BASILIC VEIN TRANSPOSITION;  Surgeon: Elam Dutch, MD;  Location: [place];  Service: Vascular;  Laterality: Left;   COLONOSCOPY W/ BIOPSIES AND POLYPECTOMY     "no problem"   HERNIA REPAIR     IR FLUORO GUIDE CV LINE RIGHT  08/22/2020   IR US GUIDE VASC ACCESS RIGHT  08/22/2020   LAPAROSCOPIC CHOLECYSTECTOMY     LIGATION OF COMPETING BRANCHES OF ARTERIOVENOUS FISTULA Left 01/25/2018   Procedure: LIGATION OF COMPETING BRANCHES And Revision of ARTERIOVENOUS FISTULA LEFT ARM.;  Surgeon: Elam Dutch, MD;  Location: [place];  Service: Vascular;  Laterality: Left;   THYROIDECTOMY  01/22/2017   THYROIDECTOMY Left 01/22/2017   Procedure: THYROIDECTOMY;  Surgeon: Melida Quitter, MD;  Location: [place];  Service: ENT;  Laterality: Left;   THYROIDECTOMY Right 03/26/2017   Procedure: COMPLETION OF THYROIDECTOMY;  Surgeon: Melida Quitter, MD;  Location: [place];  Service: ENT;  Laterality: Right;   UMBILICAL HERNIA REPAIR     WISDOM TOOTH EXTRACTION      reports that he has been smoking cigarettes. He has never used smokeless tobacco. He reports that he does not currently use alcohol. He reports that he does not use drugs. family history includes Diabetes in his mother; Healthy in his maternal grandfather, paternal grandfather, and paternal grandmother; Hypertension in his maternal grandmother; Kidney disease in his maternal grandmother; Pneumonia in his father; Stroke in his mother. No Known Allergies Current  Outpatient Medications on File Prior to Visit  Medication Sig Dispense Refill   acetaminophen (TYLENOL) 500 MG tablet Take 1,000 mg by mouth every 6 (six) hours as needed for moderate pain.     albuterol (VENTOLIN HFA) 108 (90 Base) MCG/ACT inhaler INHALE 2 PUFFS INTO THE LUNGS EVERY 6 HOURS AS NEEDED FOR WHEEZING OR SHORTNESS OF BREATH (Patient taking differently: Inhale 2 puffs into the lungs every 6 (six) hours as needed for wheezing or shortness of breath.) 18 g 5   aspirin EC 81 MG tablet Take 81 mg by mouth daily.     carvedilol (COREG) 25 MG tablet TAKE 1 TABLET(25 MG) BY MOUTH TWICE DAILY 180 tablet 2   diazepam (VALIUM) 5 MG tablet 1 tab by mouth 45 min prior to MRI 1 tablet 0   fenofibrate 54 MG tablet TAKE 1 TABLET BY MOUTH EVERY DAY 60 tablet 0   furosemide (LASIX) 80 MG tablet Take by mouth.     gabapentin (NEURONTIN) 300 MG capsule Take 1 capsule (300 mg total) by mouth at bedtime. 90 capsule 1   isosorbide mononitrate (IMDUR) 30 MG 24 hr tablet TAKE 2 TABLETS BY MOUTH DAILY. SCHEDULE OFFICE VISIT FOR FUTURE REFILLS 180 tablet 3   ketoconazole (NIZORAL) 2 % cream Apply 1 application topically daily. 60 g 2   latanoprost (XALATAN) 0.005 % ophthalmic solution Place 1 drop into the left eye at bedtime.     levothyroxine (SYNTHROID) 125 MCG tablet Take 125 mcg by mouth every morning.     lidocaine-prilocaine (EMLA) cream Apply 1 application topically 3 (three) times a week. MWF     meclizine (ANTIVERT) 25 MG tablet Take 1 tablet (25 mg total) by mouth 3 (three) times daily as needed for dizziness. 40 tablet 2   midodrine (PROAMATINE) 2.5 MG tablet Take 1 tablet (2.5 mg total) by mouth 3 (three) times daily with meals. 90 tablet 2   nitroGLYCERIN (NITROSTAT) 0.4 MG SL tablet DISSOLVE 1 TABLET UNDER THE TONGUE EVERY 5 MINUTES AS NEEDED FOR CHEST PAIN (Patient taking differently: Place 0.4 mg under the tongue every 5 (five) minutes as needed for chest pain.) 25 tablet 3   pantoprazole  (PROTONIX) 40 MG tablet Take 1 tablet (40 mg total) by mouth daily. 90 tablet 3   Potassium Chloride ER 20 MEQ TBCR Take 1 tablet by mouth daily.     sevelamer carbonate (RENVELA) 800 MG tablet Take by mouth.     thiamine (VITAMIN B-1) 50 MG tablet Take 1 tablet (50 mg total) by mouth daily. 90 tablet 1   tiZANidine (ZANAFLEX) 2 MG tablet Take 1 tablet (2 mg total) by mouth daily. 10 tablet 0  vitamin B-12 (CYANOCOBALAMIN) 1000 MCG tablet Take 1,000 mcg by mouth daily.     No current facility-administered medications on file prior to visit.        ROS:  All others reviewed and negative.  Objective        PE:  BP 138/76 (BP Location: Right Arm, Patient Position: Sitting, Cuff Size: Large)   Pulse 76   Temp 98.1 F (36.7 C) (Oral)   Ht '6\' 2"'$  (1.88 m)   Wt 269 lb (122 kg)   SpO2 97%   BMI 34.54 kg/m                 Constitutional: Pt appears in NAD               HENT: Head: NCAT.                Right Ear: External ear normal.                 Left Ear: External ear normal.                Eyes: . Pupils are equal, round, and reactive to light. Conjunctivae and EOM are normal               Nose: without d/c or deformity               Neck: Neck supple. Gross normal ROM               Cardiovascular: Normal rate and regular rhythm.                 Pulmonary/Chest: Effort normal and breath sounds without rales or wheezing.                Abd:  Soft, NT, ND, + BS, no organomegaly               Neurological: Pt is alert. At baseline orientation, motor grossly intact               Skin: Skin is warm. No rashes, no other new lesions, LE edema -  none               Bilateral 2nd third toes               Psychiatric: Pt behavior is normal without agitation   Micro: none  Cardiac tracings I have personally interpreted today:  none  Pertinent Radiological findings (summarize): none   Lab Results  Component Value Date   WBC 5.6 02/03/2022   HGB 11.3 (L) 02/03/2022   HCT 35.6 (L)  02/03/2022   PLT 160 02/03/2022   GLUCOSE 122 (H) 02/03/2022   CHOL 132 06/07/2020   TRIG 162 (H) 08/21/2020   HDL 30.40 (L) 06/07/2020   LDLCALC 72 06/07/2020   ALT 10 10/19/2021   AST 8 (L) 10/19/2021   NA 131 (L) 02/03/2022   K 3.0 (L) 02/03/2022   CL 91 (L) 02/03/2022   CREATININE 8.90 (H) 02/03/2022   BUN 25 (H) 02/03/2022   CO2 30 02/03/2022   TSH 0.50 06/07/2020   PSA 0.67 06/07/2020   INR 1.1 05/09/2021   HGBA1C 5.5 06/07/2020   Assessment/Plan:  Craig Flynn is a 61 y.o. Black or African American [2] male with  has a past medical history of Anemia (06/2015), Arthritis, Cancer ([place]), Cardiomyopathy ([place]), Chest pain (06/2015), CHF (congestive heart failure) ([place]), CKD (chronic kidney disease), stage IV ([place]), Dyspnea,  GERD (gastroesophageal reflux disease), Glaucoma, Gout, Hypertension, Obesity, Pancreatitis, Sleep apnea, Tuberculosis (1981), and Tubular adenoma of colon (10/2015).  Vitamin D deficiency Last vitamin D Lab Results  Component Value Date   VD25OH 35.91 06/07/2020   Low, reminded to start oral replacement   HTN (hypertension) BP Readings from Last 3 Encounters:  02/25/22 138/76  02/20/22 (!) 157/91  02/03/22 (!) 152/69   Stable, pt to continue medical treatment coreg 25 bid   Cervical radiculitis Recent prolonged worsening despite conservative tx - for MRI c spine, may need referral to NS or ortho or pain management  Chronic toe pain, bilateral Likely due to cockup toes, needs shoes with wider to box, for otc volt gel prn topical   Followup: Return in about 6 months (around 08/27/2022).  Cathlean Cower, MD 02/25/2022 7:42 PM [facility]

## 2022-02-25 NOTE — Assessment & Plan Note (Signed)
BP Readings from Last 3 Encounters:  02/25/22 138/76  02/20/22 (!) 157/91  02/03/22 (!) 152/69   Stable, pt to continue medical treatment coreg 25 bid

## 2022-02-25 NOTE — Patient Instructions (Addendum)
Please take all new medication as prescribed  - the ibuprofen 800 mg as needed  You will be contacted regarding the referral for: MRI for the neck  Ok to try the Voltaren gel for the toes as needed  Please continue all other medications as before, and refills have been done if requested.  Please have the pharmacy call with any other refills you may need.  Please keep your appointments with your specialists as you may have planned  Please make an Appointment to return in 6 months, or sooner if needed

## 2022-02-25 NOTE — Assessment & Plan Note (Signed)
Last vitamin D Lab Results  Component Value Date   VD25OH 35.91 06/07/2020   Low, reminded to start oral replacement

## 2022-02-26 ENCOUNTER — Encounter: Payer: Self-pay | Admitting: Cardiology

## 2022-02-27 DIAGNOSIS — N2581 Secondary hyperparathyroidism of renal origin: Secondary | ICD-10-CM | POA: Diagnosis not present

## 2022-02-27 DIAGNOSIS — Z992 Dependence on renal dialysis: Secondary | ICD-10-CM | POA: Diagnosis not present

## 2022-02-27 DIAGNOSIS — N186 End stage renal disease: Secondary | ICD-10-CM | POA: Diagnosis not present

## 2022-03-02 DIAGNOSIS — N186 End stage renal disease: Secondary | ICD-10-CM | POA: Diagnosis not present

## 2022-03-02 DIAGNOSIS — Z992 Dependence on renal dialysis: Secondary | ICD-10-CM | POA: Diagnosis not present

## 2022-03-02 DIAGNOSIS — N2581 Secondary hyperparathyroidism of renal origin: Secondary | ICD-10-CM | POA: Diagnosis not present

## 2022-03-04 DIAGNOSIS — N2581 Secondary hyperparathyroidism of renal origin: Secondary | ICD-10-CM | POA: Diagnosis not present

## 2022-03-04 DIAGNOSIS — Z992 Dependence on renal dialysis: Secondary | ICD-10-CM | POA: Diagnosis not present

## 2022-03-04 DIAGNOSIS — N186 End stage renal disease: Secondary | ICD-10-CM | POA: Diagnosis not present

## 2022-03-06 DIAGNOSIS — N2581 Secondary hyperparathyroidism of renal origin: Secondary | ICD-10-CM | POA: Diagnosis not present

## 2022-03-06 DIAGNOSIS — Z992 Dependence on renal dialysis: Secondary | ICD-10-CM | POA: Diagnosis not present

## 2022-03-06 DIAGNOSIS — N186 End stage renal disease: Secondary | ICD-10-CM | POA: Diagnosis not present

## 2022-03-09 DIAGNOSIS — N2581 Secondary hyperparathyroidism of renal origin: Secondary | ICD-10-CM | POA: Diagnosis not present

## 2022-03-09 DIAGNOSIS — N186 End stage renal disease: Secondary | ICD-10-CM | POA: Diagnosis not present

## 2022-03-09 DIAGNOSIS — Z992 Dependence on renal dialysis: Secondary | ICD-10-CM | POA: Diagnosis not present

## 2022-03-11 DIAGNOSIS — Z992 Dependence on renal dialysis: Secondary | ICD-10-CM | POA: Diagnosis not present

## 2022-03-11 DIAGNOSIS — N2581 Secondary hyperparathyroidism of renal origin: Secondary | ICD-10-CM | POA: Diagnosis not present

## 2022-03-11 DIAGNOSIS — N186 End stage renal disease: Secondary | ICD-10-CM | POA: Diagnosis not present

## 2022-03-13 DIAGNOSIS — N186 End stage renal disease: Secondary | ICD-10-CM | POA: Diagnosis not present

## 2022-03-13 DIAGNOSIS — Z992 Dependence on renal dialysis: Secondary | ICD-10-CM | POA: Diagnosis not present

## 2022-03-13 DIAGNOSIS — N2581 Secondary hyperparathyroidism of renal origin: Secondary | ICD-10-CM | POA: Diagnosis not present

## 2022-03-15 DIAGNOSIS — N2581 Secondary hyperparathyroidism of renal origin: Secondary | ICD-10-CM | POA: Diagnosis not present

## 2022-03-15 DIAGNOSIS — Z992 Dependence on renal dialysis: Secondary | ICD-10-CM | POA: Diagnosis not present

## 2022-03-15 DIAGNOSIS — N186 End stage renal disease: Secondary | ICD-10-CM | POA: Diagnosis not present

## 2022-03-18 DIAGNOSIS — N186 End stage renal disease: Secondary | ICD-10-CM | POA: Diagnosis not present

## 2022-03-18 DIAGNOSIS — Z992 Dependence on renal dialysis: Secondary | ICD-10-CM | POA: Diagnosis not present

## 2022-03-18 DIAGNOSIS — N2581 Secondary hyperparathyroidism of renal origin: Secondary | ICD-10-CM | POA: Diagnosis not present

## 2022-03-20 DIAGNOSIS — N186 End stage renal disease: Secondary | ICD-10-CM | POA: Diagnosis not present

## 2022-03-20 DIAGNOSIS — Z992 Dependence on renal dialysis: Secondary | ICD-10-CM | POA: Diagnosis not present

## 2022-03-20 DIAGNOSIS — N2581 Secondary hyperparathyroidism of renal origin: Secondary | ICD-10-CM | POA: Diagnosis not present

## 2022-03-22 DIAGNOSIS — N2581 Secondary hyperparathyroidism of renal origin: Secondary | ICD-10-CM | POA: Diagnosis not present

## 2022-03-22 DIAGNOSIS — N186 End stage renal disease: Secondary | ICD-10-CM | POA: Diagnosis not present

## 2022-03-22 DIAGNOSIS — Z992 Dependence on renal dialysis: Secondary | ICD-10-CM | POA: Diagnosis not present

## 2022-03-22 DIAGNOSIS — I129 Hypertensive chronic kidney disease with stage 1 through stage 4 chronic kidney disease, or unspecified chronic kidney disease: Secondary | ICD-10-CM | POA: Diagnosis not present

## 2022-03-25 DIAGNOSIS — N2581 Secondary hyperparathyroidism of renal origin: Secondary | ICD-10-CM | POA: Diagnosis not present

## 2022-03-25 DIAGNOSIS — Z992 Dependence on renal dialysis: Secondary | ICD-10-CM | POA: Diagnosis not present

## 2022-03-25 DIAGNOSIS — N186 End stage renal disease: Secondary | ICD-10-CM | POA: Diagnosis not present

## 2022-03-26 ENCOUNTER — Inpatient Hospital Stay: Admission: RE | Admit: 2022-03-26 | Payer: Medicare HMO | Source: Ambulatory Visit

## 2022-03-27 ENCOUNTER — Ambulatory Visit (INDEPENDENT_AMBULATORY_CARE_PROVIDER_SITE_OTHER): Payer: Medicare HMO

## 2022-03-27 VITALS — Ht 74.0 in | Wt 256.0 lb

## 2022-03-27 DIAGNOSIS — Z0000 Encounter for general adult medical examination without abnormal findings: Secondary | ICD-10-CM | POA: Diagnosis not present

## 2022-03-27 DIAGNOSIS — N186 End stage renal disease: Secondary | ICD-10-CM | POA: Diagnosis not present

## 2022-03-27 DIAGNOSIS — Z992 Dependence on renal dialysis: Secondary | ICD-10-CM | POA: Diagnosis not present

## 2022-03-27 DIAGNOSIS — N2581 Secondary hyperparathyroidism of renal origin: Secondary | ICD-10-CM | POA: Diagnosis not present

## 2022-03-27 NOTE — Patient Instructions (Signed)
Mr. Craig Flynn , Thank you for taking time to come for your Medicare Wellness Visit. I appreciate your ongoing commitment to your health goals. Please review the following plan we discussed and let me know if I can assist you in the future.   These are the goals we discussed:  Goals      Client understands the importance of follow-up with providers by attending scheduled visits        This is a list of the screening recommended for you and due dates:  Health Maintenance  Topic Date Due   DTaP/Tdap/Td vaccine (1 - Tdap) Never done   Hemoglobin A1C  12/08/2020   COVID-19 Vaccine (7 - 2023-24 season) 11/21/2021   Complete foot exam   02/11/2022   Eye exam for diabetics  05/05/2022   Medicare Annual Wellness Visit  03/28/2023   Colon Cancer Screening  12/20/2025   Hepatitis C Screening: USPSTF Recommendation to screen - Ages 18-79 yo.  Completed   HIV Screening  Completed   HPV Vaccine  Aged Out   Flu Shot  Discontinued   Zoster (Shingles) Vaccine  Discontinued    Advanced directives: No  Conditions/risks identified: Yes  Next appointment: Follow up in one year for your annual wellness visit.  Preventive Care 40-64 Years, Male Preventive care refers to lifestyle choices and visits with your health care provider that can promote health and wellness. What does preventive care include? A yearly physical exam. This is also called an annual well check. Dental exams once or twice a year. Routine eye exams. Ask your health care provider how often you should have your eyes checked. Personal lifestyle choices, including: Daily care of your teeth and gums. Regular physical activity. Eating a healthy diet. Avoiding tobacco and drug use. Limiting alcohol use. Practicing safe sex. Taking low-dose aspirin every day starting at age 62. What happens during an annual well check? The services and screenings done by your health care provider during your annual well check will depend on your age,  overall health, lifestyle risk factors, and family history of disease. Counseling  Your health care provider may ask you questions about your: Alcohol use. Tobacco use. Drug use. Emotional well-being. Home and relationship well-being. Sexual activity. Eating habits. Work and work Statistician. Screening  You may have the following tests or measurements: Height, weight, and BMI. Blood pressure. Lipid and cholesterol levels. These may be checked every 5 years, or more frequently if you are over 67 years old. Skin check. Lung cancer screening. You may have this screening every year starting at age 4 if you have a 30-pack-year history of smoking and currently smoke or have quit within the past 15 years. Fecal occult blood test (FOBT) of the stool. You may have this test every year starting at age 6. Flexible sigmoidoscopy or colonoscopy. You may have a sigmoidoscopy every 5 years or a colonoscopy every 10 years starting at age 74. Prostate cancer screening. Recommendations will vary depending on your family history and other risks. Hepatitis C blood test. Hepatitis B blood test. Sexually transmitted disease (STD) testing. Diabetes screening. This is done by checking your blood sugar (glucose) after you have not eaten for a while (fasting). You may have this done every 1-3 years. Discuss your test results, treatment options, and if necessary, the need for more tests with your health care provider. Vaccines  Your health care provider may recommend certain vaccines, such as: Influenza vaccine. This is recommended every year. Tetanus, diphtheria, and acellular pertussis (  Tdap, Td) vaccine. You may need a Td booster every 10 years. Zoster vaccine. You may need this after age 5. Pneumococcal 13-valent conjugate (PCV13) vaccine. You may need this if you have certain conditions and have not been vaccinated. Pneumococcal polysaccharide (PPSV23) vaccine. You may need one or two doses if you smoke  cigarettes or if you have certain conditions. Talk to your health care provider about which screenings and vaccines you need and how often you need them. This information is not intended to replace advice given to you by your health care provider. Make sure you discuss any questions you have with your health care provider. Document Released: 04/05/2015 Document Revised: 11/27/2015 Document Reviewed: 01/08/2015 Elsevier Interactive Patient Education  2017 [place] Prevention in the Home Falls can cause injuries. They can happen to people of all ages. There are many things you can do to make your home safe and to help prevent falls. What can I do on the outside of my home? Regularly fix the edges of walkways and driveways and fix any cracks. Remove anything that might make you trip as you walk through a door, such as a raised step or threshold. Trim any bushes or trees on the path to your home. Use bright outdoor lighting. Clear any walking paths of anything that might make someone trip, such as rocks or tools. Regularly check to see if handrails are loose or broken. Make sure that both sides of any steps have handrails. Any raised decks and porches should have guardrails on the edges. Have any leaves, snow, or ice cleared regularly. Use sand or salt on walking paths during winter. Clean up any spills in your garage right away. This includes oil or grease spills. What can I do in the bathroom? Use night lights. Install grab bars by the toilet and in the tub and shower. Do not use towel bars as grab bars. Use non-skid mats or decals in the tub or shower. If you need to sit down in the shower, use a plastic, non-slip stool. Keep the floor dry. Clean up any water that spills on the floor as soon as it happens. Remove soap buildup in the tub or shower regularly. Attach bath mats securely with double-sided non-slip rug tape. Do not have throw rugs and other things on the floor that  can make you trip. What can I do in the bedroom? Use night lights. Make sure that you have a light by your bed that is easy to reach. Do not use any sheets or blankets that are too big for your bed. They should not hang down onto the floor. Have a firm chair that has side arms. You can use this for support while you get dressed. Do not have throw rugs and other things on the floor that can make you trip. What can I do in the kitchen? Clean up any spills right away. Avoid walking on wet floors. Keep items that you use a lot in easy-to-reach places. If you need to reach something above you, use a strong step stool that has a grab bar. Keep electrical cords out of the way. Do not use floor polish or wax that makes floors slippery. If you must use wax, use non-skid floor wax. Do not have throw rugs and other things on the floor that can make you trip. What can I do with my stairs? Do not leave any items on the stairs. Make sure that there are handrails on both sides of  the stairs and use them. Fix handrails that are broken or loose. Make sure that handrails are as long as the stairways. Check any carpeting to make sure that it is firmly attached to the stairs. Fix any carpet that is loose or worn. Avoid having throw rugs at the top or bottom of the stairs. If you do have throw rugs, attach them to the floor with carpet tape. Make sure that you have a light switch at the top of the stairs and the bottom of the stairs. If you do not have them, ask someone to add them for you. What else can I do to help prevent falls? Wear shoes that: Do not have high heels. Have rubber bottoms. Are comfortable and fit you well. Are closed at the toe. Do not wear sandals. If you use a stepladder: Make sure that it is fully opened. Do not climb a closed stepladder. Make sure that both sides of the stepladder are locked into place. Ask someone to hold it for you, if possible. Clearly mark and make sure that you  can see: Any grab bars or handrails. First and last steps. Where the edge of each step is. Use tools that help you move around (mobility aids) if they are needed. These include: Canes. Walkers. Scooters. Crutches. Turn on the lights when you go into a dark area. Replace any light bulbs as soon as they burn out. Set up your furniture so you have a clear path. Avoid moving your furniture around. If any of your floors are uneven, fix them. If there are any pets around you, be aware of where they are. Review your medicines with your doctor. Some medicines can make you feel dizzy. This can increase your chance of falling. Ask your doctor what other things that you can do to help prevent falls. This information is not intended to replace advice given to you by your health care provider. Make sure you discuss any questions you have with your health care provider. Document Released: 01/03/2009 Document Revised: 08/15/2015 Document Reviewed: 04/13/2014 Elsevier Interactive Patient Education  2017 [employer].

## 2022-03-27 NOTE — Progress Notes (Signed)
Virtual Visit via Telephone Note  I connected with  Craig Flynn on 03/27/22 at  4:00 PM EST by telephone and verified that I am speaking with the correct person using two identifiers.  Location: Patient: Home Provider: [place] Persons participating in the virtual visit: [place]   I discussed the limitations, risks, security and privacy concerns of performing an evaluation and management service by telephone and the availability of in person appointments. The patient expressed understanding and agreed to proceed.  Interactive audio and video telecommunications were attempted between this nurse and patient, however failed, due to patient having technical difficulties OR patient did not have access to video capability.  We continued and completed visit with audio only.  Some vital signs may be absent or patient reported.   Sheral Flow, LPN  Subjective:   Craig Flynn is a 62 y.o. male who presents for Medicare Annual/Subsequent preventive examination.  Review of Systems     Cardiac Risk Factors include: advanced age (>35mn, >>22women);diabetes mellitus;dyslipidemia;family history of premature cardiovascular disease;hypertension;male gender     Objective:    Today's Vitals   03/27/22 1602  Weight: 256 lb (116.1 kg)  Height: '6\' 2"'$  (1.88 m)  PainSc: 0-No pain   Body mass index is 32.87 kg/m.     03/27/2022    4:04 PM 02/03/2022    9:53 PM 08/24/2021    5:36 AM 07/15/2021   10:38 AM 06/30/2021    8:41 AM 03/25/2021    9:35 AM 08/27/2020    8:33 AM  Advanced Directives  Does Patient Have a Medical Advance Directive? No No No Yes No Yes   Type of AScientist, research (medical)Living will;Out of facility DNR (pink MOST or yellow form)     Does patient want to make changes to medical advance directive?      No - Patient declined   Would patient like information on creating a medical advance directive? No - Patient declined   No - Patient declined  No - Patient declined  No - Patient declined    Current Medications (verified) Outpatient Encounter Medications as of 03/27/2022  Medication Sig   acetaminophen (TYLENOL) 500 MG tablet Take 1,000 mg by mouth every 6 (six) hours as needed for moderate pain.   albuterol (VENTOLIN HFA) 108 (90 Base) MCG/ACT inhaler INHALE 2 PUFFS INTO THE LUNGS EVERY 6 HOURS AS NEEDED FOR WHEEZING OR SHORTNESS OF BREATH (Patient taking differently: Inhale 2 puffs into the lungs every 6 (six) hours as needed for wheezing or shortness of breath.)   aspirin EC 81 MG tablet Take 81 mg by mouth daily.   carvedilol (COREG) 25 MG tablet TAKE 1 TABLET(25 MG) BY MOUTH TWICE DAILY   diazepam (VALIUM) 5 MG tablet 1 tab by mouth 45 min prior to MRI   fenofibrate 54 MG tablet TAKE 1 TABLET BY MOUTH EVERY DAY   furosemide (LASIX) 80 MG tablet Take by mouth.   gabapentin (NEURONTIN) 300 MG capsule Take 1 capsule (300 mg total) by mouth at bedtime.   ibuprofen (ADVIL) 800 MG tablet Take 1 tablet (800 mg total) by mouth every 8 (eight) hours as needed.   isosorbide mononitrate (IMDUR) 30 MG 24 hr tablet TAKE 2 TABLETS BY MOUTH DAILY. SCHEDULE OFFICE VISIT FOR FUTURE REFILLS   ketoconazole (NIZORAL) 2 % cream Apply 1 application topically daily.   latanoprost (XALATAN) 0.005 % ophthalmic solution Place 1 drop into the left eye at  bedtime.   levothyroxine (SYNTHROID) 125 MCG tablet Take 125 mcg by mouth every morning.   lidocaine-prilocaine (EMLA) cream Apply 1 application topically 3 (three) times a week. MWF   meclizine (ANTIVERT) 25 MG tablet Take 1 tablet (25 mg total) by mouth 3 (three) times daily as needed for dizziness.   midodrine (PROAMATINE) 2.5 MG tablet Take 1 tablet (2.5 mg total) by mouth 3 (three) times daily with meals.   nitroGLYCERIN (NITROSTAT) 0.4 MG SL tablet DISSOLVE 1 TABLET UNDER THE TONGUE EVERY 5 MINUTES AS NEEDED FOR CHEST PAIN (Patient taking differently: Place 0.4 mg under the  tongue every 5 (five) minutes as needed for chest pain.)   pantoprazole (PROTONIX) 40 MG tablet Take 1 tablet (40 mg total) by mouth daily.   Potassium Chloride ER 20 MEQ TBCR Take 1 tablet by mouth daily.   sevelamer carbonate (RENVELA) 800 MG tablet Take by mouth.   thiamine (VITAMIN B-1) 50 MG tablet Take 1 tablet (50 mg total) by mouth daily.   tiZANidine (ZANAFLEX) 2 MG tablet Take 1 tablet (2 mg total) by mouth daily.   vitamin B-12 (CYANOCOBALAMIN) 1000 MCG tablet Take 1,000 mcg by mouth daily.   No facility-administered encounter medications on file as of 03/27/2022.    Allergies (verified) Patient has no known allergies.   History: Past Medical History:  Diagnosis Date   Anemia 06/2015   Arthritis    knee   Cancer ([place])    thyroid cancer   Cardiomyopathy ([place])    a. 10/2013: EF reduced to 35-40% b. 09/2014: EF improved to 55-60%, Grade 1 DD noted.   Chest pain 06/2015   CHF (congestive heart failure) (HCC)    CKD (chronic kidney disease), stage IV [facility])    sees  Dr Lorrene Reid   Dyspnea    with exerion    GERD (gastroesophageal reflux disease)    Glaucoma    Gout    Hypertension    Obesity    Pancreatitis    Sleep apnea    not wearing c-pap now, needs new slep study per pt. (01/22/2017)   Tuberculosis 1981   while the pt was in the service.   Tubular adenoma of colon 10/2015   Past Surgical History:  Procedure Laterality Date   AV FISTULA PLACEMENT Left 03/25/2016   Procedure: Creation of Left Arm ARTERIOVENOUS (AV) FISTULA;  Surgeon: Elam Dutch, MD;  Location: [facility] OR;  Service: Vascular;  Laterality: Left;   AV FISTULA PLACEMENT Left 08/27/2020   Procedure: LEFT BASILIC VEIN ARTERIOVENOUS (AV) FISTULA CREATION;  Surgeon: Rosetta Posner, MD;  Location: [place];  Service: Vascular;  Laterality: Left;   Day Left 10/22/2020   Procedure: LEFT SECOND STAGE BASILIC VEIN TRANSPOSITION;  Surgeon: Elam Dutch, MD;  Location: [place];  Service:  Vascular;  Laterality: Left;   COLONOSCOPY W/ BIOPSIES AND POLYPECTOMY     "no problem"   HERNIA REPAIR     IR FLUORO GUIDE CV LINE RIGHT  08/22/2020   IR US GUIDE VASC ACCESS RIGHT  08/22/2020   LAPAROSCOPIC CHOLECYSTECTOMY     LIGATION OF COMPETING BRANCHES OF ARTERIOVENOUS FISTULA Left 01/25/2018   Procedure: LIGATION OF COMPETING BRANCHES And Revision of ARTERIOVENOUS FISTULA LEFT ARM.;  Surgeon: Elam Dutch, MD;  Location: [place];  Service: Vascular;  Laterality: Left;   THYROIDECTOMY  01/22/2017   THYROIDECTOMY Left 01/22/2017   Procedure: THYROIDECTOMY;  Surgeon: Melida Quitter, MD;  Location: [place];  Service: ENT;  Laterality: Left;   THYROIDECTOMY Right 03/26/2017   Procedure: COMPLETION OF THYROIDECTOMY;  Surgeon: Melida Quitter, MD;  Location[facility] OR;  Service: ENT;  Laterality: Right;   UMBILICAL HERNIA REPAIR     WISDOM TOOTH EXTRACTION     Family History  Problem Relation Age of Onset   Diabetes Mother    Stroke Mother    Pneumonia Father        died of Pneumonia x3   Kidney disease Maternal Grandmother    Hypertension Maternal Grandmother    Healthy Maternal Grandfather    Healthy Paternal Grandmother    Healthy Paternal Grandfather    CAD Neg Hx    Colon cancer Neg Hx    Esophageal cancer Neg Hx    Pancreatic cancer Neg Hx    Prostate cancer Neg Hx    Stomach cancer Neg Hx    Rectal cancer Neg Hx    Social History   Socioeconomic History   Marital status: Divorced    Spouse name: Not on file   Number of children: 2   Years of education: 12   Highest education level: Not on file  Occupational History   Occupation: Disability    Comment: Knees   Tobacco Use   Smoking status: Former    Years: 40.00    Types: Cigarettes    Quit date: 03/16/2022    Years since quitting: 0.0   Smokeless tobacco: Never   Tobacco comments:    1/2 pack per day (03/25/2021)    Patient stated he quit smoking on 03/16/2022  Vaping Use   Vaping Use: Never used  Substance and  Sexual Activity   Alcohol use: Not Currently    Alcohol/week: 0.0 standard drinks of alcohol   Drug use: No   Sexual activity: Yes  Other Topics Concern   Not on file  Social History Narrative   Fun: Golf, basketball    Denies religious beliefs effecting health care.       Right Handed    Drinks no caffeine    Lives in a one story home    Social Determinants of Health   Financial Resource Strain: Low Risk  (03/27/2022)   Overall Financial Resource Strain (CARDIA)    Difficulty of Paying Living Expenses: Not hard at all  Food Insecurity: No Food Insecurity (03/27/2022)   Hunger Vital Sign    Worried About Running Out of Food in the Last Year: Never true    Ran Out of Food in the Last Year: Never true  Transportation Needs: No Transportation Needs (03/27/2022)   PRAPARE - Hydrologist (Medical): No    Lack of Transportation (Non-Medical): No  Physical Activity: Sufficiently Active (03/27/2022)   Exercise Vital Sign    Days of Exercise per Week: 7 days    Minutes of Exercise per Session: 30 min  Stress: No Stress Concern Present (03/27/2022)   [place]    Feeling of Stress : Not at all  Social Connections: Moderately Integrated (03/27/2022)   Social Connection and Isolation Panel [NHANES]    Frequency of Communication with Friends and Family: More than three times a week    Frequency of Social Gatherings with Friends and Family: More than three times a week    Attends Religious Services: More than 4 times per year    Active Member of [employer] or Organizations: Yes    Attends Archivist Meetings: More than 4  times per year    Marital Status: Divorced    Tobacco Counseling Counseling given: Not Answered Tobacco comments: 1/2 pack per day (03/25/2021) Patient stated he quit smoking on 03/16/2022   Clinical Intake:  Pre-visit preparation completed: Yes  Pain : No/denies pain Pain  Score: 0-No pain     BMI - recorded: 32.87 Nutritional Status: BMI > 30  Obese Nutritional Risks: None Diabetes: No  How often do you need to have someone help you when you read instructions, pamphlets, or other written materials from your doctor or pharmacy?: 1 - Never What is the last grade level you completed in school?: HSG  Diabetic? no  Interpreter Needed?: No  Information entered by :: Lisette Abu, LPN.   Activities of Daily Living    03/27/2022    4:13 PM  In your present state of health, do you have any difficulty performing the following activities:  Hearing? 0  Vision? 0  Difficulty concentrating or making decisions? 0  Walking or climbing stairs? 0  Dressing or bathing? 0  Doing errands, shopping? 0  Preparing Food and eating ? N  Using the Toilet? N  In the past six months, have you accidently leaked urine? N  Do you have problems with loss of bowel control? N  Managing your Medications? N  Managing your Finances? N  Housekeeping or managing your Housekeeping? N    Patient Care Team: Biagio Borg, MD as PCP - General (Internal Medicine) Minus Breeding, MD as PCP - Cardiology (Cardiology) Jamal Maes, MD as Consulting Physician (Nephrology) [place], Heather, [place] as Consulting Physician [facility]) [facility], [facility] Delice Lesch, Lezlie Octave, MD as Consulting Physician (Neurology) [facility] as Consulting Physician (Podiatry)  Indicate any recent Medical Services you may have received from other than [facility] providers in the past year (date may be approximate).     Assessment:   This is a routine wellness examination for [employer].  Hearing/Vision screen Hearing Screening - Comments:: Denies hearing difficulties.  Vision Screening - Comments:: Wears rx glasses - up to date with routine eye exams with Heather [place], OD.   Dietary issues and exercise activities discussed: Current Exercise Habits: Home exercise routine,  Type of exercise: walking, Time (Minutes): 30, Frequency (Times/Week): 7, Weekly Exercise (Minutes/Week): 210, Intensity: Moderate   Goals Addressed             This Visit's Progress    Client understands the importance of follow-up with providers by attending scheduled visits        Depression Screen    03/27/2022    4:06 PM 11/07/2021    4:10 PM 07/29/2021    9:00 AM 06/19/2021    3:36 PM 05/13/2021    2:28 PM 05/13/2021    2:12 PM 03/25/2021    9:27 AM  PHQ 2/9 Scores  PHQ - 2 Score 0  0 0 0 0 0  Exception Documentation  Patient refusal         Fall Risk    03/27/2022    4:05 PM 11/07/2021    4:10 PM 10/14/2021   10:20 AM 07/29/2021    9:00 AM 07/15/2021   10:38 AM  [place] in the past year? 0 0 0 0 0  Comment   continues to deny new/ recent falls x 12 months- does not use assistive devices Denies falls x 12 months; does not use assistive devices   Number falls in past yr: 0  0  0 0  Injury with Fall? 0  0 0 0  Comment   N/A- no falls reported N/A- no falls reported   Risk for fall due to : No Fall Risks No Fall Risks No Fall Risks Medication side effect;Other (Comment)   Risk for fall due to: Comment    recent postural hypotension   Follow up Falls prevention discussed Falls evaluation completed Falls prevention discussed Falls prevention discussed     FALL RISK PREVENTION PERTAINING TO THE HOME:  Any stairs in or around the home? No  If so, are there any without handrails? No  Home free of loose throw rugs in walkways, pet beds, electrical cords, etc? Yes  Adequate lighting in your home to reduce risk of falls? Yes   ASSISTIVE DEVICES UTILIZED TO PREVENT FALLS:  Life alert? No  Use of a cane, walker or w/c? No  Grab bars in the bathroom? No  Shower chair or bench in shower? No  Elevated toilet seat or a handicapped toilet? Yes   TIMED UP AND GO:  Was the test performed? No . Phone Visit  Cognitive Function:        03/27/2022    4:05 PM  6CIT Screen   What Year? 0 points  What month? 0 points  What time? 0 points  Count back from 20 0 points  Months in reverse 0 points  Repeat phrase 0 points  Total Score 0 points    Immunizations Immunization History  Administered Date(s) Administered   Influenza Whole 01/22/2008   PFIZER Comirnaty(Gray Top)Covid-19 Tri-Sucrose Vaccine 06/24/2019, 07/18/2019, 05/02/2020   PFIZER(Purple Top)SARS-COV-2 Vaccination 06/24/2019, 07/18/2019, 05/02/2020   Pneumococcal Polysaccharide-23 01/22/2008    TDAP status: Declined, Education has been provided regarding the importance of this vaccine but patient still declined. Advised may receive this vaccine at local pharmacy or Health Dept. Aware to provide a copy of the vaccination record if obtained from local pharmacy or Health Dept. Verbalized acceptance and understanding.  Flu Vaccine status: Declined, Education has been provided regarding the importance of this vaccine but patient still declined. Advised may receive this vaccine at local pharmacy or Health Dept. Aware to provide a copy of the vaccination record if obtained from local pharmacy or Health Dept. Verbalized acceptance and understanding.  Pneumococcal vaccine status: Declined,  Education has been provided regarding the importance of this vaccine but patient still declined. Advised may receive this vaccine at local pharmacy or Health Dept. Aware to provide a copy of the vaccination record if obtained from local pharmacy or Health Dept. Verbalized acceptance and understanding.   Covid-19 vaccine status: Completed vaccines  Qualifies for Shingles Vaccine? Yes   Zostavax completed No   Shingrix Completed?: No.    Education has been provided regarding the importance of this vaccine. Patient has been advised to call insurance company to determine out of pocket expense if they have not yet received this vaccine. Advised may also receive vaccine at local pharmacy or Health Dept. Verbalized acceptance and  understanding.  Screening Tests Health Maintenance  Topic Date Due   DTaP/Tdap/Td (1 - Tdap) Never done   HEMOGLOBIN A1C  12/08/2020   COVID-19 Vaccine (7 - 2023-24 season) 11/21/2021   FOOT EXAM  02/11/2022   OPHTHALMOLOGY EXAM  05/05/2022   Medicare Annual Wellness (AWV)  03/28/2023   COLONOSCOPY (Pts 45-30yr Insurance coverage will need to be confirmed)  12/20/2025   Hepatitis C Screening  Completed   HIV Screening  Completed   HPV VACCINES  Aged  Out   INFLUENZA VACCINE  Discontinued   Zoster Vaccines- Shingrix  Discontinued    Health Maintenance  Health Maintenance Due  Topic Date Due   DTaP/Tdap/Td (1 - Tdap) Never done   HEMOGLOBIN A1C  12/08/2020   COVID-19 Vaccine (7 - 2023-24 season) 11/21/2021   FOOT EXAM  02/11/2022    Colorectal cancer screening: Type of screening: Colonoscopy. Completed 12/21/2018. Repeat every 7 years  Lung Cancer Screening: (Low Dose CT Chest recommended if Age 62-80 years, 30 pack-year currently smoking OR have quit w/in 15years.) does not qualify.   Lung Cancer Screening Referral: no  Additional Screening:  Hepatitis C Screening: does qualify; Completed 08/11/2017  Vision Screening: Recommended annual ophthalmology exams for early detection of glaucoma and other disorders of the eye. Is the patient up to date with their annual eye exam?  Yes  Who is the provider or what is the name of the office in which the patient attends annual eye exams? Heather [place], OD. If pt is not established with a provider, would they like to be referred to a provider to establish care? No .   Dental Screening: Recommended annual dental exams for proper oral hygiene  Community Resource Referral / Chronic Care Management: CRR required this visit?  No   CCM required this visit?  No      Plan:     I have personally reviewed and noted the following in the patient's chart:   Medical and social history Use of alcohol, tobacco or illicit drugs  Current  medications and supplements including opioid prescriptions. Patient is not currently taking opioid prescriptions. Functional ability and status Nutritional status Physical activity Advanced directives List of other physicians Hospitalizations, surgeries, and ER visits in previous 12 months Vitals Screenings to include cognitive, depression, and falls Referrals and appointments  In addition, I have reviewed and discussed with patient certain preventive protocols, quality metrics, and best practice recommendations. A written personalized care plan for preventive services as well as general preventive health recommendations were provided to patient.     Sheral Flow, LPN   3/0/0923   Nurse Notes:  Patient is cogitatively intact. There were no vitals filed for this visit. Patient stated that he has no issues with gait or balance; does not use any assistive devices. Medications reviewed with patient; no opioid use noted.

## 2022-03-30 DIAGNOSIS — N2581 Secondary hyperparathyroidism of renal origin: Secondary | ICD-10-CM | POA: Diagnosis not present

## 2022-03-30 DIAGNOSIS — Z992 Dependence on renal dialysis: Secondary | ICD-10-CM | POA: Diagnosis not present

## 2022-03-30 DIAGNOSIS — N186 End stage renal disease: Secondary | ICD-10-CM | POA: Diagnosis not present

## 2022-04-01 DIAGNOSIS — N186 End stage renal disease: Secondary | ICD-10-CM | POA: Diagnosis not present

## 2022-04-01 DIAGNOSIS — Z992 Dependence on renal dialysis: Secondary | ICD-10-CM | POA: Diagnosis not present

## 2022-04-01 DIAGNOSIS — N2581 Secondary hyperparathyroidism of renal origin: Secondary | ICD-10-CM | POA: Diagnosis not present

## 2022-04-03 DIAGNOSIS — N186 End stage renal disease: Secondary | ICD-10-CM | POA: Diagnosis not present

## 2022-04-03 DIAGNOSIS — Z992 Dependence on renal dialysis: Secondary | ICD-10-CM | POA: Diagnosis not present

## 2022-04-03 DIAGNOSIS — N2581 Secondary hyperparathyroidism of renal origin: Secondary | ICD-10-CM | POA: Diagnosis not present

## 2022-04-06 DIAGNOSIS — N2581 Secondary hyperparathyroidism of renal origin: Secondary | ICD-10-CM | POA: Diagnosis not present

## 2022-04-06 DIAGNOSIS — N186 End stage renal disease: Secondary | ICD-10-CM | POA: Diagnosis not present

## 2022-04-06 DIAGNOSIS — Z992 Dependence on renal dialysis: Secondary | ICD-10-CM | POA: Diagnosis not present

## 2022-04-08 DIAGNOSIS — N186 End stage renal disease: Secondary | ICD-10-CM | POA: Diagnosis not present

## 2022-04-08 DIAGNOSIS — N2581 Secondary hyperparathyroidism of renal origin: Secondary | ICD-10-CM | POA: Diagnosis not present

## 2022-04-08 DIAGNOSIS — Z992 Dependence on renal dialysis: Secondary | ICD-10-CM | POA: Diagnosis not present

## 2022-04-10 DIAGNOSIS — T82858A Stenosis of vascular prosthetic devices, implants and grafts, initial encounter: Secondary | ICD-10-CM | POA: Diagnosis not present

## 2022-04-10 DIAGNOSIS — N186 End stage renal disease: Secondary | ICD-10-CM | POA: Diagnosis not present

## 2022-04-10 DIAGNOSIS — I871 Compression of vein: Secondary | ICD-10-CM | POA: Diagnosis not present

## 2022-04-10 DIAGNOSIS — Z992 Dependence on renal dialysis: Secondary | ICD-10-CM | POA: Diagnosis not present

## 2022-04-10 DIAGNOSIS — N2581 Secondary hyperparathyroidism of renal origin: Secondary | ICD-10-CM | POA: Diagnosis not present

## 2022-04-13 DIAGNOSIS — Z992 Dependence on renal dialysis: Secondary | ICD-10-CM | POA: Diagnosis not present

## 2022-04-13 DIAGNOSIS — N2581 Secondary hyperparathyroidism of renal origin: Secondary | ICD-10-CM | POA: Diagnosis not present

## 2022-04-13 DIAGNOSIS — N186 End stage renal disease: Secondary | ICD-10-CM | POA: Diagnosis not present

## 2022-04-15 DIAGNOSIS — Z992 Dependence on renal dialysis: Secondary | ICD-10-CM | POA: Diagnosis not present

## 2022-04-15 DIAGNOSIS — N2581 Secondary hyperparathyroidism of renal origin: Secondary | ICD-10-CM | POA: Diagnosis not present

## 2022-04-15 DIAGNOSIS — N186 End stage renal disease: Secondary | ICD-10-CM | POA: Diagnosis not present

## 2022-04-16 DIAGNOSIS — Z01818 Encounter for other preprocedural examination: Secondary | ICD-10-CM | POA: Diagnosis not present

## 2022-04-17 DIAGNOSIS — Z992 Dependence on renal dialysis: Secondary | ICD-10-CM | POA: Diagnosis not present

## 2022-04-17 DIAGNOSIS — N2581 Secondary hyperparathyroidism of renal origin: Secondary | ICD-10-CM | POA: Diagnosis not present

## 2022-04-17 DIAGNOSIS — N186 End stage renal disease: Secondary | ICD-10-CM | POA: Diagnosis not present

## 2022-04-20 DIAGNOSIS — Z992 Dependence on renal dialysis: Secondary | ICD-10-CM | POA: Diagnosis not present

## 2022-04-20 DIAGNOSIS — N2581 Secondary hyperparathyroidism of renal origin: Secondary | ICD-10-CM | POA: Diagnosis not present

## 2022-04-20 DIAGNOSIS — N186 End stage renal disease: Secondary | ICD-10-CM | POA: Diagnosis not present

## 2022-04-22 DIAGNOSIS — Z992 Dependence on renal dialysis: Secondary | ICD-10-CM | POA: Diagnosis not present

## 2022-04-22 DIAGNOSIS — I129 Hypertensive chronic kidney disease with stage 1 through stage 4 chronic kidney disease, or unspecified chronic kidney disease: Secondary | ICD-10-CM | POA: Diagnosis not present

## 2022-04-22 DIAGNOSIS — N2581 Secondary hyperparathyroidism of renal origin: Secondary | ICD-10-CM | POA: Diagnosis not present

## 2022-04-22 DIAGNOSIS — N186 End stage renal disease: Secondary | ICD-10-CM | POA: Diagnosis not present

## 2022-04-24 DIAGNOSIS — N2581 Secondary hyperparathyroidism of renal origin: Secondary | ICD-10-CM | POA: Diagnosis not present

## 2022-04-24 DIAGNOSIS — Z992 Dependence on renal dialysis: Secondary | ICD-10-CM | POA: Diagnosis not present

## 2022-04-24 DIAGNOSIS — N186 End stage renal disease: Secondary | ICD-10-CM | POA: Diagnosis not present

## 2022-04-27 DIAGNOSIS — Z992 Dependence on renal dialysis: Secondary | ICD-10-CM | POA: Diagnosis not present

## 2022-04-27 DIAGNOSIS — N186 End stage renal disease: Secondary | ICD-10-CM | POA: Diagnosis not present

## 2022-04-27 DIAGNOSIS — N2581 Secondary hyperparathyroidism of renal origin: Secondary | ICD-10-CM | POA: Diagnosis not present

## 2022-04-29 DIAGNOSIS — N2581 Secondary hyperparathyroidism of renal origin: Secondary | ICD-10-CM | POA: Diagnosis not present

## 2022-04-29 DIAGNOSIS — Z992 Dependence on renal dialysis: Secondary | ICD-10-CM | POA: Diagnosis not present

## 2022-04-29 DIAGNOSIS — N186 End stage renal disease: Secondary | ICD-10-CM | POA: Diagnosis not present

## 2022-05-01 DIAGNOSIS — N2581 Secondary hyperparathyroidism of renal origin: Secondary | ICD-10-CM | POA: Diagnosis not present

## 2022-05-01 DIAGNOSIS — N186 End stage renal disease: Secondary | ICD-10-CM | POA: Diagnosis not present

## 2022-05-01 DIAGNOSIS — Z992 Dependence on renal dialysis: Secondary | ICD-10-CM | POA: Diagnosis not present

## 2022-05-04 DIAGNOSIS — N186 End stage renal disease: Secondary | ICD-10-CM | POA: Diagnosis not present

## 2022-05-04 DIAGNOSIS — N2581 Secondary hyperparathyroidism of renal origin: Secondary | ICD-10-CM | POA: Diagnosis not present

## 2022-05-04 DIAGNOSIS — Z992 Dependence on renal dialysis: Secondary | ICD-10-CM | POA: Diagnosis not present

## 2022-05-06 ENCOUNTER — Encounter: Payer: Self-pay | Admitting: Internal Medicine

## 2022-05-06 ENCOUNTER — Ambulatory Visit (INDEPENDENT_AMBULATORY_CARE_PROVIDER_SITE_OTHER): Payer: Medicare HMO | Admitting: Internal Medicine

## 2022-05-06 VITALS — BP 132/80 | HR 75 | Temp 98.7°F | Ht 74.0 in | Wt 266.0 lb

## 2022-05-06 DIAGNOSIS — I10 Essential (primary) hypertension: Secondary | ICD-10-CM | POA: Diagnosis not present

## 2022-05-06 DIAGNOSIS — R739 Hyperglycemia, unspecified: Secondary | ICD-10-CM | POA: Diagnosis not present

## 2022-05-06 DIAGNOSIS — R202 Paresthesia of skin: Secondary | ICD-10-CM

## 2022-05-06 DIAGNOSIS — N2581 Secondary hyperparathyroidism of renal origin: Secondary | ICD-10-CM | POA: Diagnosis not present

## 2022-05-06 DIAGNOSIS — N186 End stage renal disease: Secondary | ICD-10-CM | POA: Diagnosis not present

## 2022-05-06 DIAGNOSIS — Z992 Dependence on renal dialysis: Secondary | ICD-10-CM | POA: Diagnosis not present

## 2022-05-06 DIAGNOSIS — E538 Deficiency of other specified B group vitamins: Secondary | ICD-10-CM | POA: Diagnosis not present

## 2022-05-06 LAB — VITAMIN B12: Vitamin B-12: 239 pg/mL (ref 211–911)

## 2022-05-06 NOTE — Progress Notes (Unsigned)
Patient ID: Craig Flynn, male   DOB: 01-20-1961, 62 y.o.   MRN: [id number]        Chief Complaint: follow up paresthesias to feet, low b12, htn, hyperglycemia       HPI:  Craig Flynn is a 62 y.o. male here with c/o several months worsening numbness to both feet with mild discomfort but no weakness, redness, swelling.  Pt denies chest pain, increased sob or doe, wheezing, orthopnea, PND, increased LE swelling, palpitations, dizziness or syncope.   Pt denies polydipsia, polyuria, or new focal neuro s/s.    Pt denies fever, wt loss, night sweats, loss of appetite, or other constitutional symptoms  Has hx of low B12, not taking recently.         Wt Readings from Last 3 Encounters:  05/06/22 266 lb (120.7 kg)  03/27/22 256 lb (116.1 kg)  02/25/22 269 lb (122 kg)   BP Readings from Last 3 Encounters:  05/06/22 132/80  02/25/22 138/76  02/20/22 (!) 157/91         Past Medical History:  Diagnosis Date   Anemia 06/2015   Arthritis    knee   Cancer ([place])    thyroid cancer   Cardiomyopathy ([place])    a. 10/2013: EF reduced to 35-40% b. 09/2014: EF improved to 55-60%, Grade 1 DD noted.   Chest pain 06/2015   CHF (congestive heart failure) (HCC)    CKD (chronic kidney disease), stage IV [facility])    sees  Dr Lorrene Reid   Dyspnea    with exerion    GERD (gastroesophageal reflux disease)    Glaucoma    Gout    Hypertension    Obesity    Pancreatitis    Sleep apnea    not wearing c-pap now, needs new slep study per pt. (01/22/2017)   Tuberculosis 1981   while the pt was in the service.   Tubular adenoma of colon 10/2015   Past Surgical History:  Procedure Laterality Date   AV FISTULA PLACEMENT Left 03/25/2016   Procedure: Creation of Left Arm ARTERIOVENOUS (AV) FISTULA;  Surgeon: Elam Dutch, MD;  Location: [place];  Service: Vascular;  Laterality: Left;   AV FISTULA PLACEMENT Left 08/27/2020   Procedure: LEFT BASILIC VEIN ARTERIOVENOUS (AV) FISTULA CREATION;  Surgeon: Rosetta Posner, MD;   Location: [place];  Service: Vascular;  Laterality: Left;   [place] Left 10/22/2020   Procedure: LEFT SECOND STAGE BASILIC VEIN TRANSPOSITION;  Surgeon: Elam Dutch, MD;  Location: [place];  Service: Vascular;  Laterality: Left;   COLONOSCOPY W/ BIOPSIES AND POLYPECTOMY     "no problem"   HERNIA REPAIR     IR FLUORO GUIDE CV LINE RIGHT  08/22/2020   IR US GUIDE VASC ACCESS RIGHT  08/22/2020   LAPAROSCOPIC CHOLECYSTECTOMY     LIGATION OF COMPETING BRANCHES OF ARTERIOVENOUS FISTULA Left 01/25/2018   Procedure: LIGATION OF COMPETING BRANCHES And Revision of ARTERIOVENOUS FISTULA LEFT ARM.;  Surgeon: Elam Dutch, MD;  Location: [place];  Service: Vascular;  Laterality: Left;   THYROIDECTOMY  01/22/2017   THYROIDECTOMY Left 01/22/2017   Procedure: THYROIDECTOMY;  Surgeon: Melida Quitter, MD;  Location: [place];  Service: ENT;  Laterality: Left;   THYROIDECTOMY Right 03/26/2017   Procedure: COMPLETION OF THYROIDECTOMY;  Surgeon: Melida Quitter, MD;  Location: [place];  Service: ENT;  Laterality: Right;   UMBILICAL HERNIA REPAIR     WISDOM TOOTH EXTRACTION  reports that he quit smoking about 7 weeks ago. His smoking use included cigarettes. He has never used smokeless tobacco. He reports that he does not currently use alcohol. He reports that he does not use drugs. family history includes Diabetes in his mother; Healthy in his maternal grandfather, paternal grandfather, and paternal grandmother; Hypertension in his maternal grandmother; Kidney disease in his maternal grandmother; Pneumonia in his father; Stroke in his mother. No Known Allergies Current Outpatient Medications on File Prior to Visit  Medication Sig Dispense Refill   acetaminophen (TYLENOL) 500 MG tablet Take 1,000 mg by mouth every 6 (six) hours as needed for moderate pain.     albuterol (VENTOLIN HFA) 108 (90 Base) MCG/ACT inhaler INHALE 2 PUFFS INTO THE LUNGS EVERY 6 HOURS AS NEEDED FOR WHEEZING OR SHORTNESS OF BREATH  (Patient taking differently: Inhale 2 puffs into the lungs every 6 (six) hours as needed for wheezing or shortness of breath.) 18 g 5   allopurinol (ZYLOPRIM) 300 MG tablet Take 1 tablet by mouth daily.     aspirin EC 81 MG tablet Take 81 mg by mouth daily.     carvedilol (COREG) 25 MG tablet TAKE 1 TABLET(25 MG) BY MOUTH TWICE DAILY 180 tablet 2   diazepam (VALIUM) 5 MG tablet 1 tab by mouth 45 min prior to MRI 1 tablet 0   fenofibrate 54 MG tablet TAKE 1 TABLET BY MOUTH EVERY DAY 60 tablet 0   furosemide (LASIX) 80 MG tablet Take by mouth.     gabapentin (NEURONTIN) 300 MG capsule Take 1 capsule (300 mg total) by mouth at bedtime. 90 capsule 1   ibuprofen (ADVIL) 800 MG tablet Take 1 tablet (800 mg total) by mouth every 8 (eight) hours as needed. 60 tablet 1   isosorbide mononitrate (IMDUR) 30 MG 24 hr tablet TAKE 2 TABLETS BY MOUTH DAILY. SCHEDULE OFFICE VISIT FOR FUTURE REFILLS 180 tablet 3   ketoconazole (NIZORAL) 2 % cream Apply 1 application topically daily. 60 g 2   latanoprost (XALATAN) 0.005 % ophthalmic solution Place 1 drop into the left eye at bedtime.     lidocaine-prilocaine (EMLA) cream Apply 1 application topically 3 (three) times a week. MWF     meclizine (ANTIVERT) 25 MG tablet Take 1 tablet (25 mg total) by mouth 3 (three) times daily as needed for dizziness. 40 tablet 2   midodrine (PROAMATINE) 2.5 MG tablet Take 1 tablet (2.5 mg total) by mouth 3 (three) times daily with meals. 90 tablet 2   nitroGLYCERIN (NITROSTAT) 0.4 MG SL tablet DISSOLVE 1 TABLET UNDER THE TONGUE EVERY 5 MINUTES AS NEEDED FOR CHEST PAIN (Patient taking differently: Place 0.4 mg under the tongue every 5 (five) minutes as needed for chest pain.) 25 tablet 3   omeprazole (PRILOSEC) 40 MG capsule Take 1 capsule by mouth daily.     pantoprazole (PROTONIX) 40 MG tablet Take 1 tablet (40 mg total) by mouth daily. 90 tablet 3   Potassium Chloride ER 20 MEQ TBCR Take 1 tablet by mouth daily.     sevelamer  carbonate (RENVELA) 800 MG tablet Take by mouth.     thiamine (VITAMIN B-1) 50 MG tablet Take 1 tablet (50 mg total) by mouth daily. 90 tablet 1   tiZANidine (ZANAFLEX) 2 MG tablet Take 1 tablet (2 mg total) by mouth daily. 10 tablet 0   vitamin B-12 (CYANOCOBALAMIN) 1000 MCG tablet Take 1,000 mcg by mouth daily.     No current facility-administered medications on file prior  to visit.        ROS:  All others reviewed and negative.  Objective        PE:  BP 132/80 (BP Location: Right Arm, Patient Position: Sitting, Cuff Size: Large)   Pulse 75   Temp 98.7 F (37.1 C) (Oral)   Ht 6' 2"$  (1.88 m)   Wt 266 lb (120.7 kg)   SpO2 99%   BMI 34.15 kg/m                 Constitutional: Pt appears in NAD               HENT: Head: NCAT.                Right Ear: External ear normal.                 Left Ear: External ear normal.                Eyes: . Pupils are equal, round, and reactive to light. Conjunctivae and EOM are normal               Nose: without d/c or deformity               Neck: Neck supple. Gross normal ROM               Cardiovascular: Normal rate and regular rhythm.                 Pulmonary/Chest: Effort normal and breath sounds without rales or wheezing.                Abd:  Soft, NT, ND, + BS, no organomegaly               Neurological: Pt is alert. At baseline orientation, motor grossly intact, has decreased sens to Lt to toes               Skin: Skin is warm. No rashes, no other new lesions, LE edema - none               Psychiatric: Pt behavior is normal without agitation   Micro: none  Cardiac tracings I have personally interpreted today:  none  Pertinent Radiological findings (summarize): none   Lab Results  Component Value Date   WBC 5.6 02/03/2022   HGB 11.3 (L) 02/03/2022   HCT 35.6 (L) 02/03/2022   PLT 160 02/03/2022   GLUCOSE 122 (H) 02/03/2022   CHOL 132 06/07/2020   TRIG 162 (H) 08/21/2020   HDL 30.40 (L) 06/07/2020   LDLCALC 72 06/07/2020   ALT  10 10/19/2021   AST 8 (L) 10/19/2021   NA 131 (L) 02/03/2022   K 3.0 (L) 02/03/2022   CL 91 (L) 02/03/2022   CREATININE 8.90 (H) 02/03/2022   BUN 25 (H) 02/03/2022   CO2 30 02/03/2022   TSH 0.50 06/07/2020   PSA 0.67 06/07/2020   INR 1.1 05/09/2021   HGBA1C 5.5 06/07/2020   Assessment/Plan:  CLARA VIEYRA is a 62 y.o. Black or African American [2] male with  has a past medical history of Anemia (06/2015), Arthritis, Cancer ([place]), Cardiomyopathy ([place]), Chest pain (06/2015), CHF (congestive heart failure) ([place]), CKD (chronic kidney disease), stage IV ([place]), Dyspnea, GERD (gastroesophageal reflux disease), Glaucoma, Gout, Hypertension, Obesity, Pancreatitis, Sleep apnea, Tuberculosis (1981), and Tubular adenoma of colon (10/2015).  B12 deficiency Lab Results  Component Value Date   VITAMINB12 239 05/06/2022  Low, to start oral replacement - b12 1000 mcg qd   HTN (hypertension) BP Readings from Last 3 Encounters:  05/06/22 132/80  02/25/22 138/76  02/20/22 (!) 157/91   Stable, pt to continue medical treatment coreg 25 mg bid   Hyperglycemia Lab Results  Component Value Date   HGBA1C 5.5 06/07/2020   Stable, pt to continue current medical treatment  - diet, wt control   Paresthesia of foot, bilateral Etiology unclear, for b12 replacement, also refer neurology per pt request  Followup: Return in about 6 months (around 11/04/2022).  Cathlean Cower, MD 05/07/2022 9:30 PM [facility]

## 2022-05-06 NOTE — Patient Instructions (Addendum)
Please continue all other medications as before, and refills have been done if requested.  Please have the pharmacy call with any other refills you may need.  Please continue your efforts at being more active, low cholesterol diet, and weight control.  You are otherwise up to date with prevention measures today.  Please keep your appointments with your specialists as you may have planned  You will be contacted regarding the referral for: Neurology  Please go to the LAB at the blood drawing area for the tests to be done - only the B12 level today  You will be contacted by phone if any changes need to be made immediately.  Otherwise, you will receive a letter about your results with an explanation, but please check with MyChart first.  Please make an Appointment to return in 6 months, or sooner if needed

## 2022-05-07 ENCOUNTER — Encounter: Payer: Self-pay | Admitting: Internal Medicine

## 2022-05-07 ENCOUNTER — Telehealth: Payer: Self-pay | Admitting: Internal Medicine

## 2022-05-07 MED ORDER — LEVOTHYROXINE SODIUM 125 MCG PO TABS: 125.0000 ug | ORAL_TABLET | Freq: Every morning | ORAL | 3 refills | Status: DC

## 2022-05-07 NOTE — Assessment & Plan Note (Signed)
Etiology unclear, for b12 replacement, also refer neurology per pt request

## 2022-05-07 NOTE — Assessment & Plan Note (Signed)
Lab Results  Component Value Date   HGBA1C 5.5 06/07/2020   Stable, pt to continue current medical treatment  - diet, wt control

## 2022-05-07 NOTE — Telephone Encounter (Signed)
Patient called requesting a refill on his medication levothyroxine (SYNTHROID) 125 MCG tablet. Patient is requesting this refill to his pharmacy on file [place], [place] [facility].  Best callback number for patient is [phone].

## 2022-05-07 NOTE — Assessment & Plan Note (Signed)
Lab Results  Component Value Date   VITAMINB12 239 05/06/2022   Low, to start oral replacement - b12 1000 mcg qd

## 2022-05-07 NOTE — Assessment & Plan Note (Signed)
BP Readings from Last 3 Encounters:  05/06/22 132/80  02/25/22 138/76  02/20/22 (!) 157/91   Stable, pt to continue medical treatment coreg 25 mg bid

## 2022-05-07 NOTE — Telephone Encounter (Signed)
Refill has  seen sent to [facility].Marland KitchenJohny Flynn

## 2022-05-08 DIAGNOSIS — Z992 Dependence on renal dialysis: Secondary | ICD-10-CM | POA: Diagnosis not present

## 2022-05-08 DIAGNOSIS — N2581 Secondary hyperparathyroidism of renal origin: Secondary | ICD-10-CM | POA: Diagnosis not present

## 2022-05-08 DIAGNOSIS — N186 End stage renal disease: Secondary | ICD-10-CM | POA: Diagnosis not present

## 2022-05-11 DIAGNOSIS — N186 End stage renal disease: Secondary | ICD-10-CM | POA: Diagnosis not present

## 2022-05-11 DIAGNOSIS — N2581 Secondary hyperparathyroidism of renal origin: Secondary | ICD-10-CM | POA: Diagnosis not present

## 2022-05-11 DIAGNOSIS — Z992 Dependence on renal dialysis: Secondary | ICD-10-CM | POA: Diagnosis not present

## 2022-05-13 DIAGNOSIS — N186 End stage renal disease: Secondary | ICD-10-CM | POA: Diagnosis not present

## 2022-05-13 DIAGNOSIS — N2581 Secondary hyperparathyroidism of renal origin: Secondary | ICD-10-CM | POA: Diagnosis not present

## 2022-05-13 DIAGNOSIS — Z992 Dependence on renal dialysis: Secondary | ICD-10-CM | POA: Diagnosis not present

## 2022-05-15 DIAGNOSIS — N2581 Secondary hyperparathyroidism of renal origin: Secondary | ICD-10-CM | POA: Diagnosis not present

## 2022-05-15 DIAGNOSIS — Z992 Dependence on renal dialysis: Secondary | ICD-10-CM | POA: Diagnosis not present

## 2022-05-15 DIAGNOSIS — N186 End stage renal disease: Secondary | ICD-10-CM | POA: Diagnosis not present

## 2022-05-16 ENCOUNTER — Emergency Department (HOSPITAL_COMMUNITY): Payer: Medicare HMO

## 2022-05-16 ENCOUNTER — Encounter (HOSPITAL_COMMUNITY): Payer: Self-pay | Admitting: Emergency Medicine

## 2022-05-16 ENCOUNTER — Other Ambulatory Visit: Payer: Self-pay

## 2022-05-16 ENCOUNTER — Emergency Department (HOSPITAL_COMMUNITY)
Admission: EM | Admit: 2022-05-16 | Discharge: 2022-05-17 | Disposition: A | Discharge status: Home / Self Care | Payer: Medicare HMO | Attending: Emergency Medicine | Admitting: Emergency Medicine

## 2022-05-16 DIAGNOSIS — Z79899 Other long term (current) drug therapy: Secondary | ICD-10-CM | POA: Insufficient documentation

## 2022-05-16 DIAGNOSIS — I509 Heart failure, unspecified: Secondary | ICD-10-CM | POA: Insufficient documentation

## 2022-05-16 DIAGNOSIS — I2510 Atherosclerotic heart disease of native coronary artery without angina pectoris: Secondary | ICD-10-CM | POA: Insufficient documentation

## 2022-05-16 DIAGNOSIS — Z7982 Long term (current) use of aspirin: Secondary | ICD-10-CM | POA: Diagnosis not present

## 2022-05-16 DIAGNOSIS — Z992 Dependence on renal dialysis: Secondary | ICD-10-CM | POA: Diagnosis not present

## 2022-05-16 DIAGNOSIS — R079 Chest pain, unspecified: Secondary | ICD-10-CM | POA: Diagnosis not present

## 2022-05-16 DIAGNOSIS — Z85850 Personal history of malignant neoplasm of thyroid: Secondary | ICD-10-CM | POA: Diagnosis not present

## 2022-05-16 DIAGNOSIS — R0789 Other chest pain: Secondary | ICD-10-CM | POA: Diagnosis not present

## 2022-05-16 DIAGNOSIS — R202 Paresthesia of skin: Secondary | ICD-10-CM | POA: Insufficient documentation

## 2022-05-16 DIAGNOSIS — I132 Hypertensive heart and chronic kidney disease with heart failure and with stage 5 chronic kidney disease, or end stage renal disease: Secondary | ICD-10-CM | POA: Diagnosis not present

## 2022-05-16 DIAGNOSIS — N186 End stage renal disease: Secondary | ICD-10-CM | POA: Insufficient documentation

## 2022-05-16 LAB — CBC
HCT: 34 % — ABNORMAL LOW (ref 39.0–52.0)
Hemoglobin: 11.1 g/dL — ABNORMAL LOW (ref 13.0–17.0)
MCH: 28.5 pg (ref 26.0–34.0)
MCHC: 32.6 g/dL (ref 30.0–36.0)
MCV: 87.4 fL (ref 80.0–100.0)
Platelets: 144 10*3/uL — ABNORMAL LOW (ref 150–400)
RBC: 3.89 MIL/uL — ABNORMAL LOW (ref 4.22–5.81)
RDW: 14.1 % (ref 11.5–15.5)
WBC: 6.1 10*3/uL (ref 4.0–10.5)
nRBC: 0 % (ref 0.0–0.2)

## 2022-05-16 LAB — CBG MONITORING, ED: Glucose-Capillary: 86 mg/dL (ref 70–99)

## 2022-05-16 NOTE — ED Triage Notes (Signed)
Pt c/o non radiating centralized chest pain described as sharp that started at approx 2200. Endorses lightheadedness. Pt states he took tylenol which relieved the pain but when he laid down both his arms and legs went numb for approx 5-10 minutes. Pt states his chest pain and leg numbness has since resolved but is still experiencing lightheadedness some numbness in both arms. Endorses chronic shob.

## 2022-05-17 LAB — TROPONIN I (HIGH SENSITIVITY)
Troponin I (High Sensitivity): 19 ng/L — ABNORMAL HIGH (ref <18)
Troponin I (High Sensitivity): 21 ng/L — ABNORMAL HIGH (ref <18)

## 2022-05-17 LAB — BASIC METABOLIC PANEL
Anion gap: 12 (ref 5–15)
BUN: 25 mg/dL — ABNORMAL HIGH (ref 8–23)
CO2: 24 mmol/L (ref 22–32)
Calcium: 9.5 mg/dL (ref 8.9–10.3)
Chloride: 94 mmol/L — ABNORMAL LOW (ref 98–111)
Creatinine, Ser: 7.51 mg/dL — ABNORMAL HIGH (ref 0.61–1.24)
GFR, Estimated: 8 mL/min — ABNORMAL LOW (ref >60)
Glucose, Bld: 88 mg/dL (ref 70–99)
Potassium: 4.1 mmol/L (ref 3.5–5.1)
Sodium: 130 mmol/L — ABNORMAL LOW (ref 135–145)

## 2022-05-17 MED ORDER — LORAZEPAM 1 MG PO TABS: 2.0000 mg | ORAL_TABLET | Freq: Once | ORAL | Status: DC

## 2022-05-17 MED ORDER — DICYCLOMINE HCL 10 MG/5ML PO SOLN
10.0000 mg | Freq: Once | ORAL | Status: DC
  Filled 2022-05-17: qty 5

## 2022-05-17 MED ORDER — ALUM & MAG HYDROXIDE-SIMETH 200-200-20 MG/5ML PO SUSP
30.0000 mL | Freq: Once | ORAL | Status: AC
  Administered 2022-05-17: 30 mL via ORAL
  Filled 2022-05-17: qty 30

## 2022-05-17 NOTE — ED Provider Notes (Signed)
[facility] Provider Note   CSN: [id number] Arrival date & time: 05/16/22  2254     History Chief Complaint  Patient presents with   Chest Pain    Craig Flynn is a 62 y.o. male dialysis patient M/W/F with last session on Friday 05/15/22 who presents with concern for sudden onset central chest pain described as pressure that started at 10 pm. States he took 2 tylenol which improved pain but subsequently had 10 minutes of bilateral upper extremity tingling. Has some neuropathy at baseline. No SOB. Pain nonradiating, waxing and waning, nonexertional. 5/10 on intake, significant improved now.  I have reviewed his medical records. He has hx of HTN, CHF, ESRD on HD, CAD, hyperlipidemia, hx of thyroid cancer s/p thyroidectomy. Left AV fistula. No anticoagulation.  History of reflux on Protonix.  HPI     Home Medications Prior to Admission medications   Medication Sig Start Date End Date Taking? Authorizing Provider  acetaminophen (TYLENOL) 500 MG tablet Take 1,000 mg by mouth every 6 (six) hours as needed for moderate pain.   Yes [provider]  albuterol (VENTOLIN HFA) 108 (90 Base) MCG/ACT inhaler INHALE 2 PUFFS INTO THE LUNGS EVERY 6 HOURS AS NEEDED FOR WHEEZING OR SHORTNESS OF BREATH Patient taking differently: Inhale 2 puffs into the lungs every 6 (six) hours as needed for wheezing or shortness of breath. 08/01/20  Yes Biagio Borg, MD  aspirin EC 81 MG tablet Take 81 mg by mouth daily.   Yes [provider]  carvedilol (COREG) 25 MG tablet TAKE 1 TABLET(25 MG) BY MOUTH TWICE DAILY 12/23/21  Yes Biagio Borg, MD  furosemide (LASIX) 80 MG tablet Take 80 mg by mouth daily. 02/02/22  Yes [provider]  isosorbide mononitrate (IMDUR) 30 MG 24 hr tablet TAKE 2 TABLETS BY MOUTH DAILY. SCHEDULE OFFICE VISIT FOR FUTURE REFILLS 02/05/21  Yes Minus Breeding, MD  levothyroxine (SYNTHROID) 125 MCG tablet Take 1 tablet (125  mcg total) by mouth every morning. 05/07/22  Yes Biagio Borg, MD  nitroGLYCERIN (NITROSTAT) 0.4 MG SL tablet DISSOLVE 1 TABLET UNDER THE TONGUE EVERY 5 MINUTES AS NEEDED FOR CHEST PAIN Patient taking differently: Place 0.4 mg under the tongue every 5 (five) minutes as needed for chest pain. 03/29/19  Yes Tami Lin, MD  pantoprazole (PROTONIX) 40 MG tablet Take 1 tablet (40 mg total) by mouth daily. Patient taking differently: Take 80 mg by mouth daily. 10/21/21  Yes Biagio Borg, MD  diazepam (VALIUM) 5 MG tablet 1 tab by mouth 45 min prior to MRI Patient not taking: Reported on 05/17/2022 05/23/21   Biagio Borg, MD  fenofibrate 54 MG tablet TAKE 1 TABLET BY MOUTH EVERY DAY Patient not taking: Reported on 05/17/2022 08/07/20   Minus Breeding, MD  gabapentin (NEURONTIN) 300 MG capsule Take 1 capsule (300 mg total) by mouth at bedtime. Patient not taking: Reported on 05/17/2022 09/17/21   Biagio Borg, MD  ibuprofen (ADVIL) 800 MG tablet Take 1 tablet (800 mg total) by mouth every 8 (eight) hours as needed. Patient not taking: Reported on 05/17/2022 02/25/22   Biagio Borg, MD  ketoconazole (NIZORAL) 2 % cream Apply 1 application topically daily. Patient not taking: Reported on 05/17/2022 02/11/21   Lorenda Peck, DPM  meclizine (ANTIVERT) 25 MG tablet Take 1 tablet (25 mg total) by mouth 3 (three) times daily as needed for dizziness. Patient not taking: Reported on 05/17/2022 11/07/21  Biagio Borg, MD  midodrine (PROAMATINE) 2.5 MG tablet Take 1 tablet (2.5 mg total) by mouth 3 (three) times daily with meals. Patient not taking: Reported on 05/17/2022 11/07/21   Biagio Borg, MD  thiamine (VITAMIN B-1) 50 MG tablet Take 1 tablet (50 mg total) by mouth daily. Patient not taking: Reported on 05/17/2022 03/25/20   Janith Lima, MD  tiZANidine (ZANAFLEX) 2 MG tablet Take 1 tablet (2 mg total) by mouth daily. Patient not taking: Reported on 05/17/2022 02/20/22   Raspet, Derry Skill, PA-C       Allergies    Patient has no known allergies.    Review of Systems   Review of Systems  Constitutional: Negative.   HENT: Negative.    Eyes: Negative.   Respiratory: Negative.    Cardiovascular:  Positive for chest pain. Negative for palpitations and leg swelling.  Gastrointestinal: Negative.   Genitourinary: Negative.   Musculoskeletal: Negative.   Neurological:  Positive for light-headedness. Negative for dizziness, tremors, seizures, syncope, facial asymmetry, speech difficulty, weakness, numbness and headaches.    Physical Exam Updated Vital Signs BP (!) 146/69 (BP Location: Right Arm)   Pulse 73   Temp (!) 97.5 F (36.4 C) (Oral)   Resp 15   Ht '6\' 2"'$  (1.88 m)   Wt 118.8 kg   SpO2 100%   BMI 33.64 kg/m  Physical Exam Vitals and nursing note reviewed.  Constitutional:      Appearance: He is obese. He is not ill-appearing or toxic-appearing.  HENT:     Head: Normocephalic and atraumatic.     Mouth/Throat:     Mouth: Mucous membranes are moist.     Pharynx: No oropharyngeal exudate or posterior oropharyngeal erythema.  Eyes:     General:        Right eye: No discharge.        Left eye: No discharge.     Conjunctiva/sclera: Conjunctivae normal.  Cardiovascular:     Rate and Rhythm: Normal rate and regular rhythm.     Pulses: Normal pulses.     Heart sounds: Normal heart sounds. No murmur heard. Pulmonary:     Effort: Pulmonary effort is normal. No tachypnea, bradypnea, accessory muscle usage, prolonged expiration or respiratory distress.     Breath sounds: Normal breath sounds. No wheezing or rales.  Chest:     Chest wall: No mass, lacerations, deformity, swelling, tenderness or edema.  Abdominal:     General: Bowel sounds are normal. There is no distension.     Palpations: Abdomen is soft.     Tenderness: There is no abdominal tenderness. There is no right CVA tenderness, left CVA tenderness, guarding or rebound.  Musculoskeletal:        General: No  deformity.     Cervical back: Neck supple.     Right lower leg: No edema.     Left lower leg: No edema.  Skin:    General: Skin is warm and dry.     Capillary Refill: Capillary refill takes less than 2 seconds.  Neurological:     General: No focal deficit present.     Mental Status: He is alert and oriented to person, place, and time. Mental status is at baseline.     GCS: GCS eye subscore is 4. GCS verbal subscore is 5. GCS motor subscore is 6.  Psychiatric:        Mood and Affect: Mood normal.     ED Results / Procedures / Treatments  Labs (all labs ordered are listed, but only abnormal results are displayed) Labs Reviewed  BASIC METABOLIC PANEL - Abnormal; Notable for the following components:      Result Value   Sodium 130 (*)    Chloride 94 (*)    BUN 25 (*)    Creatinine, Ser 7.51 (*)    GFR, Estimated 8 (*)    All other components within normal limits  CBC - Abnormal; Notable for the following components:   RBC 3.89 (*)    Hemoglobin 11.1 (*)    HCT 34.0 (*)    Platelets 144 (*)    All other components within normal limits  TROPONIN I (HIGH SENSITIVITY) - Abnormal; Notable for the following components:   Troponin I (High Sensitivity) 19 (*)    All other components within normal limits  TROPONIN I (HIGH SENSITIVITY) - Abnormal; Notable for the following components:   Troponin I (High Sensitivity) 21 (*)    All other components within normal limits  CBG MONITORING, ED    EKG EKG Interpretation  Date/Time:  Saturday May 16 2022 22:55:57 EST Ventricular Rate:  72 PR Interval:  198 QRS Duration: 90 QT Interval:  404 QTC Calculation: 442 R Axis:   14 Text Interpretation: Normal sinus rhythm Normal ECG When compared with ECG of 03-Feb-2022 21:44, PREVIOUS ECG IS PRESENT Confirmed by Merrily Pew [phone]) on 05/17/2022 3:03:18 AM  Radiology DG Chest 2 View  Result Date: 05/16/2022 CLINICAL DATA:  Chest pain EXAM: CHEST - 2 VIEW COMPARISON:  02/03/2022  FINDINGS: Stable cardiomediastinal silhouette. Aortic atherosclerotic calcification. No focal consolidation, pleural effusion, or pneumothorax. No acute osseous abnormality. IMPRESSION: No active cardiopulmonary disease. Electronically Signed   By: Placido Sou M.D.   On: 05/16/2022 23:43    Procedures Procedures    Medications Ordered in ED Medications  alum & mag hydroxide-simeth (MAALOX/MYLANTA) 200-200-20 MG/5ML suspension 30 mL (30 mLs Oral Given 05/17/22 0532)    ED Course/ Medical Decision Making/ A&P Clinical Course as of 05/17/22 [id number]  Nancy Fetter May 17, 2022  0140 CIWA of 8 at this time by this provider per discussion with patient.  [RS]    Clinical Course User Index [RS] Frankee Gritz, Gypsy Balsam, PA-C                             Medical Decision Making 62 year old male who present with concern for central chest pressure and transient tingling in all extremities.  Hypertensive on intake, vitals otherwise normal.  Cardiopulmonary and abdominal exams are benign.  Patient neurovascular intact in all extremities.  Dialysis fistula with palpable thrill.  DDx includes limited to ACS, PE, pleural effusion, pneumonia, pneumothorax, dysrhythmia, GERD.  Amount and/or Complexity of Data Reviewed Labs: ordered.    Details: CBC without leukocytosis, mild anemia with hemoglobin 11 at patient's baseline.  BMP with mild hyponatremia 130, creatinine at baseline of 7.5.  Normal potassium.  Troponin initially 19, delta troponin 21 near patient's baseline. Radiology: ordered.    Details:   Chest x-ray negative for acute cardiopulmonary disease.   ECG/medicine tests:     Details: EKG with sinus rhythm, no interval changes, no STEMI.   Risk OTC drugs.    Patient reevaluated the resolution of his chest pain, does endorse eating new foods this evening and feeling as though his reflux symptoms or exacerbated, belching.  Maalox administered with resolution of his symptomatology.  Endorses baseline  neuropathy in his feet, otherwise unremarkable.  Neurologic exam remains nonfocal at this time.  Clinical concern for emergent underlying etiology for this patient symptoms that would warrant further ED workup or inpatient management is exceedingly low.  No further workup warranted in the ER at this time.  Zenia Resides voiced understanding of his medical evaluation and treatment plan. Each of their questions answered to their expressed satisfaction.  Return precautions were given.  Patient is well-appearing, stable, and was discharged in good condition.  This chart was dictated using voice recognition software, Dragon. Despite the best efforts of this provider to proofread and correct errors, errors may still occur which can change documentation meaning.   Final Clinical Impression(s) / ED Diagnoses Final diagnoses:  Chest pain, unspecified type    Rx / DC Orders ED Discharge Orders     None         Aura Dials 05/17/22 [id number]    Merrily Pew, MD 05/17/22 [phone]

## 2022-05-17 NOTE — Discharge Instructions (Signed)
You were seen in the ER today for your central chest pressure.  Your physical exam was reassuring as was your blood work.  There does not appear to be any emergent problems your heart or lungs at this time.  Please continue take your medications for reflux.  Follow-up with your primary care doctor and your cardiologist and return to the ER with any new severe symptoms.

## 2022-05-18 DIAGNOSIS — N186 End stage renal disease: Secondary | ICD-10-CM | POA: Diagnosis not present

## 2022-05-18 DIAGNOSIS — Z992 Dependence on renal dialysis: Secondary | ICD-10-CM | POA: Diagnosis not present

## 2022-05-18 DIAGNOSIS — N2581 Secondary hyperparathyroidism of renal origin: Secondary | ICD-10-CM | POA: Diagnosis not present

## 2022-05-20 DIAGNOSIS — N186 End stage renal disease: Secondary | ICD-10-CM | POA: Diagnosis not present

## 2022-05-20 DIAGNOSIS — N2581 Secondary hyperparathyroidism of renal origin: Secondary | ICD-10-CM | POA: Diagnosis not present

## 2022-05-20 DIAGNOSIS — Z992 Dependence on renal dialysis: Secondary | ICD-10-CM | POA: Diagnosis not present

## 2022-05-21 DIAGNOSIS — N186 End stage renal disease: Secondary | ICD-10-CM | POA: Diagnosis not present

## 2022-05-21 DIAGNOSIS — I129 Hypertensive chronic kidney disease with stage 1 through stage 4 chronic kidney disease, or unspecified chronic kidney disease: Secondary | ICD-10-CM | POA: Diagnosis not present

## 2022-05-21 DIAGNOSIS — Z992 Dependence on renal dialysis: Secondary | ICD-10-CM | POA: Diagnosis not present

## 2022-05-22 DIAGNOSIS — N2581 Secondary hyperparathyroidism of renal origin: Secondary | ICD-10-CM | POA: Diagnosis not present

## 2022-05-22 DIAGNOSIS — N186 End stage renal disease: Secondary | ICD-10-CM | POA: Diagnosis not present

## 2022-05-22 DIAGNOSIS — Z992 Dependence on renal dialysis: Secondary | ICD-10-CM | POA: Diagnosis not present

## 2022-05-25 ENCOUNTER — Telehealth: Payer: Self-pay | Admitting: *Deleted

## 2022-05-25 DIAGNOSIS — N186 End stage renal disease: Secondary | ICD-10-CM | POA: Diagnosis not present

## 2022-05-25 DIAGNOSIS — Z992 Dependence on renal dialysis: Secondary | ICD-10-CM | POA: Diagnosis not present

## 2022-05-25 DIAGNOSIS — N2581 Secondary hyperparathyroidism of renal origin: Secondary | ICD-10-CM | POA: Diagnosis not present

## 2022-05-25 NOTE — Telephone Encounter (Signed)
        Patient  visited [place] on 05/17/2022  for transportation   Telephone encounter attempt :  2nd  A HIPAA compliant voice message was left requesting a return call.  Instructed patient to call back at [phone].  [place] [phone] [address] , [place] [address] Email : [email or web address]

## 2022-05-27 DIAGNOSIS — N186 End stage renal disease: Secondary | ICD-10-CM | POA: Diagnosis not present

## 2022-05-27 DIAGNOSIS — N2581 Secondary hyperparathyroidism of renal origin: Secondary | ICD-10-CM | POA: Diagnosis not present

## 2022-05-27 DIAGNOSIS — Z992 Dependence on renal dialysis: Secondary | ICD-10-CM | POA: Diagnosis not present

## 2022-05-29 DIAGNOSIS — N2581 Secondary hyperparathyroidism of renal origin: Secondary | ICD-10-CM | POA: Diagnosis not present

## 2022-05-29 DIAGNOSIS — Z992 Dependence on renal dialysis: Secondary | ICD-10-CM | POA: Diagnosis not present

## 2022-05-29 DIAGNOSIS — N186 End stage renal disease: Secondary | ICD-10-CM | POA: Diagnosis not present

## 2022-06-01 DIAGNOSIS — N2581 Secondary hyperparathyroidism of renal origin: Secondary | ICD-10-CM | POA: Diagnosis not present

## 2022-06-01 DIAGNOSIS — Z992 Dependence on renal dialysis: Secondary | ICD-10-CM | POA: Diagnosis not present

## 2022-06-01 DIAGNOSIS — N186 End stage renal disease: Secondary | ICD-10-CM | POA: Diagnosis not present

## 2022-06-02 ENCOUNTER — Encounter: Payer: Self-pay | Admitting: Neurology

## 2022-06-02 ENCOUNTER — Ambulatory Visit: Payer: Medicare HMO | Admitting: Neurology

## 2022-06-03 DIAGNOSIS — N2581 Secondary hyperparathyroidism of renal origin: Secondary | ICD-10-CM | POA: Diagnosis not present

## 2022-06-03 DIAGNOSIS — N186 End stage renal disease: Secondary | ICD-10-CM | POA: Diagnosis not present

## 2022-06-03 DIAGNOSIS — Z992 Dependence on renal dialysis: Secondary | ICD-10-CM | POA: Diagnosis not present

## 2022-06-05 DIAGNOSIS — N186 End stage renal disease: Secondary | ICD-10-CM | POA: Diagnosis not present

## 2022-06-05 DIAGNOSIS — Z992 Dependence on renal dialysis: Secondary | ICD-10-CM | POA: Diagnosis not present

## 2022-06-05 DIAGNOSIS — N2581 Secondary hyperparathyroidism of renal origin: Secondary | ICD-10-CM | POA: Diagnosis not present

## 2022-06-08 DIAGNOSIS — Z992 Dependence on renal dialysis: Secondary | ICD-10-CM | POA: Diagnosis not present

## 2022-06-08 DIAGNOSIS — N2581 Secondary hyperparathyroidism of renal origin: Secondary | ICD-10-CM | POA: Diagnosis not present

## 2022-06-08 DIAGNOSIS — N186 End stage renal disease: Secondary | ICD-10-CM | POA: Diagnosis not present

## 2022-06-10 DIAGNOSIS — N2581 Secondary hyperparathyroidism of renal origin: Secondary | ICD-10-CM | POA: Diagnosis not present

## 2022-06-10 DIAGNOSIS — N186 End stage renal disease: Secondary | ICD-10-CM | POA: Diagnosis not present

## 2022-06-10 DIAGNOSIS — Z992 Dependence on renal dialysis: Secondary | ICD-10-CM | POA: Diagnosis not present

## 2022-06-12 DIAGNOSIS — Z992 Dependence on renal dialysis: Secondary | ICD-10-CM | POA: Diagnosis not present

## 2022-06-12 DIAGNOSIS — N2581 Secondary hyperparathyroidism of renal origin: Secondary | ICD-10-CM | POA: Diagnosis not present

## 2022-06-12 DIAGNOSIS — N186 End stage renal disease: Secondary | ICD-10-CM | POA: Diagnosis not present

## 2022-06-15 DIAGNOSIS — N2581 Secondary hyperparathyroidism of renal origin: Secondary | ICD-10-CM | POA: Diagnosis not present

## 2022-06-15 DIAGNOSIS — Z992 Dependence on renal dialysis: Secondary | ICD-10-CM | POA: Diagnosis not present

## 2022-06-15 DIAGNOSIS — N186 End stage renal disease: Secondary | ICD-10-CM | POA: Diagnosis not present

## 2022-06-17 DIAGNOSIS — N186 End stage renal disease: Secondary | ICD-10-CM | POA: Diagnosis not present

## 2022-06-17 DIAGNOSIS — Z992 Dependence on renal dialysis: Secondary | ICD-10-CM | POA: Diagnosis not present

## 2022-06-17 DIAGNOSIS — N2581 Secondary hyperparathyroidism of renal origin: Secondary | ICD-10-CM | POA: Diagnosis not present

## 2022-06-19 DIAGNOSIS — Z992 Dependence on renal dialysis: Secondary | ICD-10-CM | POA: Diagnosis not present

## 2022-06-19 DIAGNOSIS — N186 End stage renal disease: Secondary | ICD-10-CM | POA: Diagnosis not present

## 2022-06-19 DIAGNOSIS — N2581 Secondary hyperparathyroidism of renal origin: Secondary | ICD-10-CM | POA: Diagnosis not present

## 2022-06-21 DIAGNOSIS — N186 End stage renal disease: Secondary | ICD-10-CM | POA: Diagnosis not present

## 2022-06-21 DIAGNOSIS — I129 Hypertensive chronic kidney disease with stage 1 through stage 4 chronic kidney disease, or unspecified chronic kidney disease: Secondary | ICD-10-CM | POA: Diagnosis not present

## 2022-06-21 DIAGNOSIS — Z992 Dependence on renal dialysis: Secondary | ICD-10-CM | POA: Diagnosis not present

## 2022-06-22 DIAGNOSIS — N2581 Secondary hyperparathyroidism of renal origin: Secondary | ICD-10-CM | POA: Diagnosis not present

## 2022-06-22 DIAGNOSIS — Z992 Dependence on renal dialysis: Secondary | ICD-10-CM | POA: Diagnosis not present

## 2022-06-22 DIAGNOSIS — N186 End stage renal disease: Secondary | ICD-10-CM | POA: Diagnosis not present

## 2022-06-23 ENCOUNTER — Ambulatory Visit: Payer: Medicare HMO | Admitting: Podiatry

## 2022-06-24 DIAGNOSIS — Z992 Dependence on renal dialysis: Secondary | ICD-10-CM | POA: Diagnosis not present

## 2022-06-24 DIAGNOSIS — N186 End stage renal disease: Secondary | ICD-10-CM | POA: Diagnosis not present

## 2022-06-24 DIAGNOSIS — N2581 Secondary hyperparathyroidism of renal origin: Secondary | ICD-10-CM | POA: Diagnosis not present

## 2022-06-26 DIAGNOSIS — N186 End stage renal disease: Secondary | ICD-10-CM | POA: Diagnosis not present

## 2022-06-26 DIAGNOSIS — N2581 Secondary hyperparathyroidism of renal origin: Secondary | ICD-10-CM | POA: Diagnosis not present

## 2022-06-26 DIAGNOSIS — Z992 Dependence on renal dialysis: Secondary | ICD-10-CM | POA: Diagnosis not present

## 2022-06-29 DIAGNOSIS — N186 End stage renal disease: Secondary | ICD-10-CM | POA: Diagnosis not present

## 2022-06-29 DIAGNOSIS — Z992 Dependence on renal dialysis: Secondary | ICD-10-CM | POA: Diagnosis not present

## 2022-06-29 DIAGNOSIS — N2581 Secondary hyperparathyroidism of renal origin: Secondary | ICD-10-CM | POA: Diagnosis not present

## 2022-07-01 DIAGNOSIS — N186 End stage renal disease: Secondary | ICD-10-CM | POA: Diagnosis not present

## 2022-07-01 DIAGNOSIS — Z992 Dependence on renal dialysis: Secondary | ICD-10-CM | POA: Diagnosis not present

## 2022-07-01 DIAGNOSIS — N2581 Secondary hyperparathyroidism of renal origin: Secondary | ICD-10-CM | POA: Diagnosis not present

## 2022-07-02 NOTE — Progress Notes (Deleted)
  Cardiology Office Note:   Date:  07/02/2022  ID:  Craig Flynn, DOB 21-Jun-1960, MRN [id number]  History of Present Illness:   Craig Flynn is a 62 y.o. male who presents for evaluation of cardiomyopathy and hypertension.  I have not seen him since 2022.  The patient has long-standing hypertension. He was seen a couple of years ago with mildly reduced ejection fraction of about 45%. In July 2016 his EF was said to be about 55%.    Perfusion  study in 2017 demonstrated EF of 47% with a fixed inferior wall defect.   I sent him for a POET (Plain Old Exercise Treadmill) which was negative but he had a suboptimal heart rate response in May 2021.    Echo in April of 2021 demonstrated an EF of 60 to 65%.  He was in the ED in Feb 2024 for chest pain and I reviewed these records for this visit.   He was treated for a possible GI source.  ***    Since I last saw him he has continued to have the sharp shooting chest discomfort sporadically in his mid chest.  He feels like there is are not.  He did not really improve with steroid taper or nonsteroidal cream.  However, its not reproducible with exertion.  He is not describing neck discomfort or arm discomfort but has had no new shortness of breath, PND or orthopnea.  He walks a mile a day with his son and is not a started golfing soon.  ROS: ***  Studies Reviewed:    EKG:  ***  ***  Risk Assessment/Calculations:   {Does this patient have ATRIAL FIBRILLATION?[phone]} No BP recorded.  {Refresh Note OR Click here to enter BP  :1}***        Physical Exam:   VS:  There were no vitals taken for this visit.   Wt Readings from Last 3 Encounters:  05/16/22 262 lb (118.8 kg)  05/06/22 266 lb (120.7 kg)  03/27/22 256 lb (116.1 kg)     GEN: Well nourished, well developed in no acute distress NECK: No JVD; No carotid bruits CARDIAC: ***RRR, no murmurs, rubs, gallops RESPIRATORY:  Clear to auscultation without rales, wheezing or rhonchi  ABDOMEN: Soft,  non-tender, non-distended EXTREMITIES:  No edema; No deformity   ASSESSMENT AND PLAN:   CARDIOMYOPATHY:   EF was normal in April 2021.  *** No change in therapy or further imaging is indicated.   HTN:   Blood pressure is ***  at target.  No change in therapy.    OBESITY: ***  He has maintained his weight loss and I encouraged more of the same.    CKD:    *** He tolerates dialysis.    CHEST PAIN:    *** This is nonanginal.  No further cardiac work-up.       {Are you ordering a CV Procedure (e.g. stress test, cath, DCCV, TEE, etc)?   Press F2        :[id number]}   Signed, Rollene Rotunda, MD

## 2022-07-03 ENCOUNTER — Ambulatory Visit: Payer: Medicare HMO | Attending: Cardiology | Admitting: Cardiology

## 2022-07-03 DIAGNOSIS — N2581 Secondary hyperparathyroidism of renal origin: Secondary | ICD-10-CM | POA: Diagnosis not present

## 2022-07-03 DIAGNOSIS — N186 End stage renal disease: Secondary | ICD-10-CM | POA: Diagnosis not present

## 2022-07-03 DIAGNOSIS — R072 Precordial pain: Secondary | ICD-10-CM

## 2022-07-03 DIAGNOSIS — Z992 Dependence on renal dialysis: Secondary | ICD-10-CM | POA: Diagnosis not present

## 2022-07-03 DIAGNOSIS — I10 Essential (primary) hypertension: Secondary | ICD-10-CM

## 2022-07-03 DIAGNOSIS — I5032 Chronic diastolic (congestive) heart failure: Secondary | ICD-10-CM

## 2022-07-06 ENCOUNTER — Ambulatory Visit: Payer: Medicare HMO | Admitting: Podiatry

## 2022-07-06 ENCOUNTER — Encounter: Payer: Self-pay | Admitting: Podiatry

## 2022-07-06 DIAGNOSIS — G629 Polyneuropathy, unspecified: Secondary | ICD-10-CM | POA: Diagnosis not present

## 2022-07-06 DIAGNOSIS — N185 Chronic kidney disease, stage 5: Secondary | ICD-10-CM | POA: Diagnosis not present

## 2022-07-06 DIAGNOSIS — N2581 Secondary hyperparathyroidism of renal origin: Secondary | ICD-10-CM | POA: Diagnosis not present

## 2022-07-06 DIAGNOSIS — E1122 Type 2 diabetes mellitus with diabetic chronic kidney disease: Secondary | ICD-10-CM | POA: Diagnosis not present

## 2022-07-06 DIAGNOSIS — Z992 Dependence on renal dialysis: Secondary | ICD-10-CM | POA: Diagnosis not present

## 2022-07-06 DIAGNOSIS — N186 End stage renal disease: Secondary | ICD-10-CM | POA: Diagnosis not present

## 2022-07-06 DIAGNOSIS — E1140 Type 2 diabetes mellitus with diabetic neuropathy, unspecified: Secondary | ICD-10-CM | POA: Diagnosis not present

## 2022-07-06 NOTE — Progress Notes (Signed)
  Subjective:  Patient ID: Craig Flynn, male    DOB: [birth year],   MRN: [id number]  Chief Complaint  Patient presents with   Numbness    62 y.o. male presents for a foot exam.  He is on dialysis and has stage V CKD.  Denies any pain in his feet. Relates more numbness in his feet realtes burning or tingling. He is currently on 300 mg gabapentin. States he is not a diabetic Relates his last A1c was  5.5 06/07/20 . Denies any other pedal complaints. Denies n/v/f/c.   PCP: Oliver Barre MD   Past Medical History:  Diagnosis Date   Anemia 06/2015   Arthritis    knee   Cancer (HCC)    thyroid cancer   Cardiomyopathy (HCC)    a. 10/2013: EF reduced to 35-40% b. 09/2014: EF improved to 55-60%, Grade 1 DD noted.   Chest pain 06/2015   CHF (congestive heart failure) (HCC)    CKD (chronic kidney disease), stage IV [facility])    sees  Dr Eliott Nine   Dyspnea    with exerion    GERD (gastroesophageal reflux disease)    Glaucoma    Gout    Hypertension    Obesity    Pancreatitis    Sleep apnea    not wearing c-pap now, needs new slep study per pt. (01/22/2017)   Tuberculosis 1981   while the pt was in the service.   Tubular adenoma of colon 10/2015    Objective:  Physical Exam: Vascular: DP/PT pulses 2/4 bilateral. CFT <3 seconds. Normal hair growth on digits. No edema.  Skin. No lacerations or abrasions bilateral feet. Scaling and peeling noted circumferentially around the outside and inside of his feet bilateral improved  Musculoskeletal: MMT 5/5 bilateral lower extremities in DF, PF, Inversion and Eversion. Deceased ROM in DF of ankle joint.  Neurological: Sensation intact to light touch.   Assessment:   1. Neuropathy   2. CKD (chronic kidney disease), stage V       Plan:  Patient was evaluated and treated and all questions answered. -Discussed and educated patient on  foot care, especially with regards to the vascular, neurological and musculoskeletal systems.  -Discussed  supportive shoes at all times and checking feet regularly.  -Discussed neruopathy and discussed talking with PCP to increase gabapentin dose to see if this helps.  -Discussed capsaicin cream.  -Answered all patient questions -Patient to return  in 1 year for foot exam.  -Patient advised to call the office if any problems or questions arise in the meantime.   Louann Sjogren, DPM

## 2022-07-08 DIAGNOSIS — Z992 Dependence on renal dialysis: Secondary | ICD-10-CM | POA: Diagnosis not present

## 2022-07-08 DIAGNOSIS — N2581 Secondary hyperparathyroidism of renal origin: Secondary | ICD-10-CM | POA: Diagnosis not present

## 2022-07-08 DIAGNOSIS — N186 End stage renal disease: Secondary | ICD-10-CM | POA: Diagnosis not present

## 2022-07-10 DIAGNOSIS — Z992 Dependence on renal dialysis: Secondary | ICD-10-CM | POA: Diagnosis not present

## 2022-07-10 DIAGNOSIS — N186 End stage renal disease: Secondary | ICD-10-CM | POA: Diagnosis not present

## 2022-07-10 DIAGNOSIS — N2581 Secondary hyperparathyroidism of renal origin: Secondary | ICD-10-CM | POA: Diagnosis not present

## 2022-07-13 DIAGNOSIS — N2581 Secondary hyperparathyroidism of renal origin: Secondary | ICD-10-CM | POA: Diagnosis not present

## 2022-07-13 DIAGNOSIS — N186 End stage renal disease: Secondary | ICD-10-CM | POA: Diagnosis not present

## 2022-07-13 DIAGNOSIS — Z992 Dependence on renal dialysis: Secondary | ICD-10-CM | POA: Diagnosis not present

## 2022-07-15 DIAGNOSIS — N2581 Secondary hyperparathyroidism of renal origin: Secondary | ICD-10-CM | POA: Diagnosis not present

## 2022-07-15 DIAGNOSIS — N186 End stage renal disease: Secondary | ICD-10-CM | POA: Diagnosis not present

## 2022-07-15 DIAGNOSIS — Z992 Dependence on renal dialysis: Secondary | ICD-10-CM | POA: Diagnosis not present

## 2022-07-17 DIAGNOSIS — N2581 Secondary hyperparathyroidism of renal origin: Secondary | ICD-10-CM | POA: Diagnosis not present

## 2022-07-17 DIAGNOSIS — Z992 Dependence on renal dialysis: Secondary | ICD-10-CM | POA: Diagnosis not present

## 2022-07-17 DIAGNOSIS — N186 End stage renal disease: Secondary | ICD-10-CM | POA: Diagnosis not present

## 2022-07-20 DIAGNOSIS — Z992 Dependence on renal dialysis: Secondary | ICD-10-CM | POA: Diagnosis not present

## 2022-07-20 DIAGNOSIS — N2581 Secondary hyperparathyroidism of renal origin: Secondary | ICD-10-CM | POA: Diagnosis not present

## 2022-07-20 DIAGNOSIS — N186 End stage renal disease: Secondary | ICD-10-CM | POA: Diagnosis not present

## 2022-07-21 DIAGNOSIS — I129 Hypertensive chronic kidney disease with stage 1 through stage 4 chronic kidney disease, or unspecified chronic kidney disease: Secondary | ICD-10-CM | POA: Diagnosis not present

## 2022-07-21 DIAGNOSIS — Z992 Dependence on renal dialysis: Secondary | ICD-10-CM | POA: Diagnosis not present

## 2022-07-21 DIAGNOSIS — N186 End stage renal disease: Secondary | ICD-10-CM | POA: Diagnosis not present

## 2022-07-22 DIAGNOSIS — Z992 Dependence on renal dialysis: Secondary | ICD-10-CM | POA: Diagnosis not present

## 2022-07-22 DIAGNOSIS — N186 End stage renal disease: Secondary | ICD-10-CM | POA: Diagnosis not present

## 2022-07-22 DIAGNOSIS — N2581 Secondary hyperparathyroidism of renal origin: Secondary | ICD-10-CM | POA: Diagnosis not present

## 2022-07-24 DIAGNOSIS — N186 End stage renal disease: Secondary | ICD-10-CM | POA: Diagnosis not present

## 2022-07-24 DIAGNOSIS — N2581 Secondary hyperparathyroidism of renal origin: Secondary | ICD-10-CM | POA: Diagnosis not present

## 2022-07-24 DIAGNOSIS — Z992 Dependence on renal dialysis: Secondary | ICD-10-CM | POA: Diagnosis not present

## 2022-07-27 DIAGNOSIS — N2581 Secondary hyperparathyroidism of renal origin: Secondary | ICD-10-CM | POA: Diagnosis not present

## 2022-07-27 DIAGNOSIS — Z992 Dependence on renal dialysis: Secondary | ICD-10-CM | POA: Diagnosis not present

## 2022-07-27 DIAGNOSIS — N186 End stage renal disease: Secondary | ICD-10-CM | POA: Diagnosis not present

## 2022-07-29 DIAGNOSIS — N2581 Secondary hyperparathyroidism of renal origin: Secondary | ICD-10-CM | POA: Diagnosis not present

## 2022-07-29 DIAGNOSIS — Z992 Dependence on renal dialysis: Secondary | ICD-10-CM | POA: Diagnosis not present

## 2022-07-29 DIAGNOSIS — N186 End stage renal disease: Secondary | ICD-10-CM | POA: Diagnosis not present

## 2022-07-31 DIAGNOSIS — N186 End stage renal disease: Secondary | ICD-10-CM | POA: Diagnosis not present

## 2022-07-31 DIAGNOSIS — Z992 Dependence on renal dialysis: Secondary | ICD-10-CM | POA: Diagnosis not present

## 2022-07-31 DIAGNOSIS — N2581 Secondary hyperparathyroidism of renal origin: Secondary | ICD-10-CM | POA: Diagnosis not present

## 2022-08-03 DIAGNOSIS — N2581 Secondary hyperparathyroidism of renal origin: Secondary | ICD-10-CM | POA: Diagnosis not present

## 2022-08-03 DIAGNOSIS — N186 End stage renal disease: Secondary | ICD-10-CM | POA: Diagnosis not present

## 2022-08-03 DIAGNOSIS — Z992 Dependence on renal dialysis: Secondary | ICD-10-CM | POA: Diagnosis not present

## 2022-08-05 DIAGNOSIS — Z992 Dependence on renal dialysis: Secondary | ICD-10-CM | POA: Diagnosis not present

## 2022-08-05 DIAGNOSIS — N186 End stage renal disease: Secondary | ICD-10-CM | POA: Diagnosis not present

## 2022-08-05 DIAGNOSIS — N2581 Secondary hyperparathyroidism of renal origin: Secondary | ICD-10-CM | POA: Diagnosis not present

## 2022-08-07 DIAGNOSIS — N2581 Secondary hyperparathyroidism of renal origin: Secondary | ICD-10-CM | POA: Diagnosis not present

## 2022-08-07 DIAGNOSIS — Z992 Dependence on renal dialysis: Secondary | ICD-10-CM | POA: Diagnosis not present

## 2022-08-07 DIAGNOSIS — N186 End stage renal disease: Secondary | ICD-10-CM | POA: Diagnosis not present

## 2022-08-10 DIAGNOSIS — N2581 Secondary hyperparathyroidism of renal origin: Secondary | ICD-10-CM | POA: Diagnosis not present

## 2022-08-10 DIAGNOSIS — Z992 Dependence on renal dialysis: Secondary | ICD-10-CM | POA: Diagnosis not present

## 2022-08-10 DIAGNOSIS — N186 End stage renal disease: Secondary | ICD-10-CM | POA: Diagnosis not present

## 2022-08-12 DIAGNOSIS — N186 End stage renal disease: Secondary | ICD-10-CM | POA: Diagnosis not present

## 2022-08-12 DIAGNOSIS — Z992 Dependence on renal dialysis: Secondary | ICD-10-CM | POA: Diagnosis not present

## 2022-08-12 DIAGNOSIS — N2581 Secondary hyperparathyroidism of renal origin: Secondary | ICD-10-CM | POA: Diagnosis not present

## 2022-08-13 DIAGNOSIS — R072 Precordial pain: Secondary | ICD-10-CM | POA: Insufficient documentation

## 2022-08-13 NOTE — Progress Notes (Deleted)
  Cardiology Office Note:   Date:  08/13/2022  ID:  Craig Flynn, DOB 07-07-1960, MRN [id number]  History of Present Illness:   Craig Flynn is a 62 y.o. male  who presents for evaluation of cardiomyopathy and hypertension. The patient has long-standing hypertension. He was seen a couple of years ago with mildly reduced ejection fraction of about 45%. In July 2016 his EF was said to be about 55%.    Perfusion  study in 2017 demonstrated EF of 47% with a fixed inferior wall defect.   I sent him for a POET (Plain Old Exercise Treadmill) which was negative but he had a suboptimal heart rate response.   This was May of last year.  Echo in April of 2021 demonstrated an EF of 60 to 65%.  He was in the emergency room in September for chest pain.  I reviewed these records.  He had a flat troponin that was minimally elevated.  It was thought to have costochondritis and was treated with prednisone taper along with topical nonsteroidals.   Since I last saw him ***   *** he has continued to have the sharp shooting chest discomfort sporadically in his mid chest.  He feels like there is are not.  He did not really improve with steroid taper or nonsteroidal cream.  However, its not reproducible with exertion.  He is not describing neck discomfort or arm discomfort but has had no new shortness of breath, PND or orthopnea.  He walks a mile a day with his son and is not a started golfing soon.  ROS: ***  Studies Reviewed:    EKG:  ***  ***  Risk Assessment/Calculations:   {Does this patient have ATRIAL FIBRILLATION?:[id number]} No BP recorded.  {Refresh Note OR Click here to enter BP  :1}***        Physical Exam:   VS:  There were no vitals taken for this visit.   Wt Readings from Last 3 Encounters:  05/16/22 262 lb (118.8 kg)  05/06/22 266 lb (120.7 kg)  03/27/22 256 lb (116.1 kg)     GEN: Well nourished, well developed in no acute distress NECK: No JVD; No carotid bruits CARDIAC: ***RRR, no  murmurs, rubs, gallops RESPIRATORY:  Clear to auscultation without rales, wheezing or rhonchi  ABDOMEN: Soft, non-tender, non-distended EXTREMITIES:  No edema; No deformity   ASSESSMENT AND PLAN:   CARDIOMYOPATHY:   EF was normal in April 2021. ***  No change in therapy or further imaging is indicated.   HTN:   Blood pressure is ***at target.  No change in therapy.    OBESITY: ***  He has maintained his weight loss and I encouraged more of the same.    CKD:    ***  He tolerates dialysis.    CHEST PAIN:    This is nonanginal.  ***  No further cardiac work-up.     {Are you ordering a CV Procedure (e.g. stress test, cath, DCCV, TEE, etc)?   Press F2        :[id number]}   Signed, Rollene Rotunda, MD

## 2022-08-14 ENCOUNTER — Ambulatory Visit: Payer: Medicare HMO | Admitting: Cardiology

## 2022-08-14 DIAGNOSIS — I10 Essential (primary) hypertension: Secondary | ICD-10-CM

## 2022-08-14 DIAGNOSIS — Z992 Dependence on renal dialysis: Secondary | ICD-10-CM | POA: Diagnosis not present

## 2022-08-14 DIAGNOSIS — N185 Chronic kidney disease, stage 5: Secondary | ICD-10-CM

## 2022-08-14 DIAGNOSIS — N186 End stage renal disease: Secondary | ICD-10-CM | POA: Diagnosis not present

## 2022-08-14 DIAGNOSIS — R072 Precordial pain: Secondary | ICD-10-CM

## 2022-08-14 DIAGNOSIS — N2581 Secondary hyperparathyroidism of renal origin: Secondary | ICD-10-CM | POA: Diagnosis not present

## 2022-08-14 DIAGNOSIS — I5032 Chronic diastolic (congestive) heart failure: Secondary | ICD-10-CM

## 2022-08-17 DIAGNOSIS — Z992 Dependence on renal dialysis: Secondary | ICD-10-CM | POA: Diagnosis not present

## 2022-08-17 DIAGNOSIS — N2581 Secondary hyperparathyroidism of renal origin: Secondary | ICD-10-CM | POA: Diagnosis not present

## 2022-08-17 DIAGNOSIS — N186 End stage renal disease: Secondary | ICD-10-CM | POA: Diagnosis not present

## 2022-08-19 DIAGNOSIS — N2581 Secondary hyperparathyroidism of renal origin: Secondary | ICD-10-CM | POA: Diagnosis not present

## 2022-08-19 DIAGNOSIS — N186 End stage renal disease: Secondary | ICD-10-CM | POA: Diagnosis not present

## 2022-08-19 DIAGNOSIS — Z992 Dependence on renal dialysis: Secondary | ICD-10-CM | POA: Diagnosis not present

## 2022-08-21 ENCOUNTER — Other Ambulatory Visit: Payer: Self-pay

## 2022-08-21 ENCOUNTER — Telehealth: Payer: Self-pay | Admitting: Internal Medicine

## 2022-08-21 DIAGNOSIS — Z992 Dependence on renal dialysis: Secondary | ICD-10-CM | POA: Diagnosis not present

## 2022-08-21 DIAGNOSIS — N186 End stage renal disease: Secondary | ICD-10-CM | POA: Diagnosis not present

## 2022-08-21 DIAGNOSIS — I129 Hypertensive chronic kidney disease with stage 1 through stage 4 chronic kidney disease, or unspecified chronic kidney disease: Secondary | ICD-10-CM | POA: Diagnosis not present

## 2022-08-21 DIAGNOSIS — N2581 Secondary hyperparathyroidism of renal origin: Secondary | ICD-10-CM | POA: Diagnosis not present

## 2022-08-21 MED ORDER — ISOSORBIDE MONONITRATE ER 30 MG PO TB24: ORAL_TABLET | ORAL | 3 refills | Status: DC

## 2022-08-21 NOTE — Telephone Encounter (Signed)
Prescription Request  08/21/2022  LOV: 05/06/2022  What is the name of the medication or equipment? isosorbide mononitrate (IMDUR) 30 MG 24 hr tablet  Have you contacted your pharmacy to request a refill? Yes   Which pharmacy would you like this sent to?   [id number] [place], [place] - [address] AT [facility] OF [facility] [address] [place] [address] Phone: [phone] Fax: [phone]   Patient notified that their request is being sent to the clinical staff for review and that they should receive a response within 2 business days.   Please advise at Mobile [phone] (mobile)

## 2022-08-21 NOTE — Telephone Encounter (Signed)
Refill Sent. 

## 2022-08-24 DIAGNOSIS — N2581 Secondary hyperparathyroidism of renal origin: Secondary | ICD-10-CM | POA: Diagnosis not present

## 2022-08-24 DIAGNOSIS — Z992 Dependence on renal dialysis: Secondary | ICD-10-CM | POA: Diagnosis not present

## 2022-08-24 DIAGNOSIS — N186 End stage renal disease: Secondary | ICD-10-CM | POA: Diagnosis not present

## 2022-08-26 DIAGNOSIS — Z992 Dependence on renal dialysis: Secondary | ICD-10-CM | POA: Diagnosis not present

## 2022-08-26 DIAGNOSIS — N2581 Secondary hyperparathyroidism of renal origin: Secondary | ICD-10-CM | POA: Diagnosis not present

## 2022-08-26 DIAGNOSIS — N186 End stage renal disease: Secondary | ICD-10-CM | POA: Diagnosis not present

## 2022-08-28 DIAGNOSIS — Z992 Dependence on renal dialysis: Secondary | ICD-10-CM | POA: Diagnosis not present

## 2022-08-28 DIAGNOSIS — N186 End stage renal disease: Secondary | ICD-10-CM | POA: Diagnosis not present

## 2022-08-28 DIAGNOSIS — N2581 Secondary hyperparathyroidism of renal origin: Secondary | ICD-10-CM | POA: Diagnosis not present

## 2022-08-31 DIAGNOSIS — Z992 Dependence on renal dialysis: Secondary | ICD-10-CM | POA: Diagnosis not present

## 2022-08-31 DIAGNOSIS — N186 End stage renal disease: Secondary | ICD-10-CM | POA: Diagnosis not present

## 2022-08-31 DIAGNOSIS — N2581 Secondary hyperparathyroidism of renal origin: Secondary | ICD-10-CM | POA: Diagnosis not present

## 2022-09-02 DIAGNOSIS — N2581 Secondary hyperparathyroidism of renal origin: Secondary | ICD-10-CM | POA: Diagnosis not present

## 2022-09-02 DIAGNOSIS — Z992 Dependence on renal dialysis: Secondary | ICD-10-CM | POA: Diagnosis not present

## 2022-09-02 DIAGNOSIS — N186 End stage renal disease: Secondary | ICD-10-CM | POA: Diagnosis not present

## 2022-09-04 DIAGNOSIS — Z992 Dependence on renal dialysis: Secondary | ICD-10-CM | POA: Diagnosis not present

## 2022-09-04 DIAGNOSIS — N2581 Secondary hyperparathyroidism of renal origin: Secondary | ICD-10-CM | POA: Diagnosis not present

## 2022-09-04 DIAGNOSIS — N186 End stage renal disease: Secondary | ICD-10-CM | POA: Diagnosis not present

## 2022-09-07 DIAGNOSIS — N2581 Secondary hyperparathyroidism of renal origin: Secondary | ICD-10-CM | POA: Diagnosis not present

## 2022-09-07 DIAGNOSIS — Z992 Dependence on renal dialysis: Secondary | ICD-10-CM | POA: Diagnosis not present

## 2022-09-07 DIAGNOSIS — N186 End stage renal disease: Secondary | ICD-10-CM | POA: Diagnosis not present

## 2022-09-09 DIAGNOSIS — N186 End stage renal disease: Secondary | ICD-10-CM | POA: Diagnosis not present

## 2022-09-09 DIAGNOSIS — N2581 Secondary hyperparathyroidism of renal origin: Secondary | ICD-10-CM | POA: Diagnosis not present

## 2022-09-09 DIAGNOSIS — Z992 Dependence on renal dialysis: Secondary | ICD-10-CM | POA: Diagnosis not present

## 2022-09-11 DIAGNOSIS — N186 End stage renal disease: Secondary | ICD-10-CM | POA: Diagnosis not present

## 2022-09-11 DIAGNOSIS — N2581 Secondary hyperparathyroidism of renal origin: Secondary | ICD-10-CM | POA: Diagnosis not present

## 2022-09-11 DIAGNOSIS — Z992 Dependence on renal dialysis: Secondary | ICD-10-CM | POA: Diagnosis not present

## 2022-09-14 DIAGNOSIS — Z992 Dependence on renal dialysis: Secondary | ICD-10-CM | POA: Diagnosis not present

## 2022-09-14 DIAGNOSIS — N186 End stage renal disease: Secondary | ICD-10-CM | POA: Diagnosis not present

## 2022-09-14 DIAGNOSIS — N2581 Secondary hyperparathyroidism of renal origin: Secondary | ICD-10-CM | POA: Diagnosis not present

## 2022-09-16 DIAGNOSIS — Z992 Dependence on renal dialysis: Secondary | ICD-10-CM | POA: Diagnosis not present

## 2022-09-16 DIAGNOSIS — N2581 Secondary hyperparathyroidism of renal origin: Secondary | ICD-10-CM | POA: Diagnosis not present

## 2022-09-16 DIAGNOSIS — N186 End stage renal disease: Secondary | ICD-10-CM | POA: Diagnosis not present

## 2022-09-17 NOTE — Progress Notes (Deleted)
Cardiology Office Note:   Date:  09/17/2022  ID:  Craig Flynn, DOB [birth year], MRN [id number] PCP: Corwin Levins, MD  [facility] Providers Cardiologist:  Rollene Rotunda, MD {  History of Present Illness:   Craig Flynn is a 62 y.o. male who presents for evaluation of cardiomyopathy and hypertension.  We have not seen him since November 2022.  He saw him previously and he had a reduced ejection fraction of 45%.  This was several years ago.  But 2016 his EF improved to 55%.  In 2017 he had a perfusion study with an EF of 47% and a fixed inferior defect.  In May 2021 he had a negative POET (Plain Old Exercise Treadmill).  April 2021 his EF was 60 to 65%.  Most recently he was seen in the emergency room with chest discomfort in February of this year.  I reviewed these records for this visit.  Enzymes are minimally elevated and there was no trend upward.  EKG was nonacute.  ***  February 2024.  The patient has long-standing hypertension. He was seen a couple of years ago with mildly reduced ejection fraction of about 45%. In July 2016 his EF was said to be about 55%.    Perfusion  study in 2017 demonstrated EF of 47% with a fixed inferior wall defect.   I sent him for a POET (Plain Old Exercise Treadmill) which was negative but he had a suboptimal heart rate response.   This was May of last year.  Echo in April of 2021 demonstrated an EF of 60 to 65%.  He was in the emergency room in September for chest pain.  I reviewed these records.  He had a flat troponin that was minimally elevated.  It was thought to have costochondritis and was treated with prednisone taper along with topical nonsteroidals.  Since I last saw him he has continued to have the sharp shooting chest discomfort sporadically in his mid chest.  He feels like there is are not.  He did not really improve with steroid taper or nonsteroidal cream.  However, its not reproducible with exertion.  He is not describing neck discomfort or  arm discomfort but has had no new shortness of breath, PND or orthopnea.  He walks a mile a day with his son and is not a started golfing soon.  ROS: ***  Studies Reviewed:    EKG:       ***  Risk Assessment/Calculations:   {Does this patient have ATRIAL FIBRILLATION?:[id number]} No BP recorded.  {Refresh Note OR Click here to enter BP  :1}***        Physical Exam:   VS:  There were no vitals taken for this visit.   Wt Readings from Last 3 Encounters:  05/16/22 262 lb (118.8 kg)  05/06/22 266 lb (120.7 kg)  03/27/22 256 lb (116.1 kg)     GEN: Well nourished, well developed in no acute distress NECK: No JVD; No carotid bruits CARDIAC: ***RRR, no murmurs, rubs, gallops RESPIRATORY:  Clear to auscultation without rales, wheezing or rhonchi  ABDOMEN: Soft, non-tender, non-distended EXTREMITIES:  No edema; No deformity   ASSESSMENT AND PLAN:   CARDIOMYOPATHY:   EF was normal in April 2021.  *** No change in therapy or further imaging is indicated.   HTN:   Blood pressure is ***  at target.  No change in therapy.    OBESITY:   ***  He has maintained his weight loss and  I encouraged more of the same.    CKD:    ***  He tolerates dialysis.    CHEST PAIN:     ***  This is nonanginal.  No further cardiac work-up.     {Are you ordering a CV Procedure (e.g. stress test, cath, DCCV, TEE, etc)?   Press F2        :[id number]}  Follow up ***  Signed, Rollene Rotunda, MD

## 2022-09-18 ENCOUNTER — Ambulatory Visit: Payer: Medicare HMO | Attending: Cardiology | Admitting: Cardiology

## 2022-09-18 DIAGNOSIS — N186 End stage renal disease: Secondary | ICD-10-CM | POA: Diagnosis not present

## 2022-09-18 DIAGNOSIS — N2581 Secondary hyperparathyroidism of renal origin: Secondary | ICD-10-CM | POA: Diagnosis not present

## 2022-09-18 DIAGNOSIS — I420 Dilated cardiomyopathy: Secondary | ICD-10-CM

## 2022-09-18 DIAGNOSIS — N185 Chronic kidney disease, stage 5: Secondary | ICD-10-CM

## 2022-09-18 DIAGNOSIS — I10 Essential (primary) hypertension: Secondary | ICD-10-CM

## 2022-09-18 DIAGNOSIS — Z992 Dependence on renal dialysis: Secondary | ICD-10-CM | POA: Diagnosis not present

## 2022-09-18 DIAGNOSIS — R072 Precordial pain: Secondary | ICD-10-CM

## 2022-09-20 DIAGNOSIS — I129 Hypertensive chronic kidney disease with stage 1 through stage 4 chronic kidney disease, or unspecified chronic kidney disease: Secondary | ICD-10-CM | POA: Diagnosis not present

## 2022-09-20 DIAGNOSIS — N186 End stage renal disease: Secondary | ICD-10-CM | POA: Diagnosis not present

## 2022-09-20 DIAGNOSIS — Z992 Dependence on renal dialysis: Secondary | ICD-10-CM | POA: Diagnosis not present

## 2022-09-21 DIAGNOSIS — Z992 Dependence on renal dialysis: Secondary | ICD-10-CM | POA: Diagnosis not present

## 2022-09-21 DIAGNOSIS — N186 End stage renal disease: Secondary | ICD-10-CM | POA: Diagnosis not present

## 2022-09-21 DIAGNOSIS — N2581 Secondary hyperparathyroidism of renal origin: Secondary | ICD-10-CM | POA: Diagnosis not present

## 2022-09-23 DIAGNOSIS — N186 End stage renal disease: Secondary | ICD-10-CM | POA: Diagnosis not present

## 2022-09-23 DIAGNOSIS — N2581 Secondary hyperparathyroidism of renal origin: Secondary | ICD-10-CM | POA: Diagnosis not present

## 2022-09-23 DIAGNOSIS — Z992 Dependence on renal dialysis: Secondary | ICD-10-CM | POA: Diagnosis not present

## 2022-09-25 ENCOUNTER — Telehealth: Payer: Self-pay | Admitting: Internal Medicine

## 2022-09-25 DIAGNOSIS — Z992 Dependence on renal dialysis: Secondary | ICD-10-CM | POA: Diagnosis not present

## 2022-09-25 DIAGNOSIS — N2581 Secondary hyperparathyroidism of renal origin: Secondary | ICD-10-CM | POA: Diagnosis not present

## 2022-09-25 DIAGNOSIS — N186 End stage renal disease: Secondary | ICD-10-CM | POA: Diagnosis not present

## 2022-09-25 MED ORDER — GABAPENTIN 300 MG PO CAPS: 300.0000 mg | ORAL_CAPSULE | Freq: Every day | ORAL | 1 refills | Status: DC

## 2022-09-25 NOTE — Telephone Encounter (Signed)
Caller & Relationship to patient: Self  Call back number: [phone]   Date of last office visit: 2.14.24  Date of next office visit: N/A  Medication(s) to be refilled:  gabapentin (NEURONTIN) 100 MG capsule   Preferred Pharmacy:  [facility] #[id number]   Phone: [phone]  Fax: [phone]

## 2022-09-28 DIAGNOSIS — N186 End stage renal disease: Secondary | ICD-10-CM | POA: Diagnosis not present

## 2022-09-28 DIAGNOSIS — Z992 Dependence on renal dialysis: Secondary | ICD-10-CM | POA: Diagnosis not present

## 2022-09-28 DIAGNOSIS — N2581 Secondary hyperparathyroidism of renal origin: Secondary | ICD-10-CM | POA: Diagnosis not present

## 2022-10-01 ENCOUNTER — Ambulatory Visit: Payer: Medicare HMO | Admitting: Family Medicine

## 2022-10-02 DIAGNOSIS — N186 End stage renal disease: Secondary | ICD-10-CM | POA: Diagnosis not present

## 2022-10-02 DIAGNOSIS — N2581 Secondary hyperparathyroidism of renal origin: Secondary | ICD-10-CM | POA: Diagnosis not present

## 2022-10-02 DIAGNOSIS — Z992 Dependence on renal dialysis: Secondary | ICD-10-CM | POA: Diagnosis not present

## 2022-10-05 DIAGNOSIS — N186 End stage renal disease: Secondary | ICD-10-CM | POA: Diagnosis not present

## 2022-10-05 DIAGNOSIS — Z992 Dependence on renal dialysis: Secondary | ICD-10-CM | POA: Diagnosis not present

## 2022-10-05 DIAGNOSIS — N2581 Secondary hyperparathyroidism of renal origin: Secondary | ICD-10-CM | POA: Diagnosis not present

## 2022-10-07 DIAGNOSIS — N186 End stage renal disease: Secondary | ICD-10-CM | POA: Diagnosis not present

## 2022-10-07 DIAGNOSIS — N2581 Secondary hyperparathyroidism of renal origin: Secondary | ICD-10-CM | POA: Diagnosis not present

## 2022-10-07 DIAGNOSIS — Z992 Dependence on renal dialysis: Secondary | ICD-10-CM | POA: Diagnosis not present

## 2022-10-09 DIAGNOSIS — N2581 Secondary hyperparathyroidism of renal origin: Secondary | ICD-10-CM | POA: Diagnosis not present

## 2022-10-09 DIAGNOSIS — N186 End stage renal disease: Secondary | ICD-10-CM | POA: Diagnosis not present

## 2022-10-09 DIAGNOSIS — Z992 Dependence on renal dialysis: Secondary | ICD-10-CM | POA: Diagnosis not present

## 2022-10-12 DIAGNOSIS — Z992 Dependence on renal dialysis: Secondary | ICD-10-CM | POA: Diagnosis not present

## 2022-10-12 DIAGNOSIS — N2581 Secondary hyperparathyroidism of renal origin: Secondary | ICD-10-CM | POA: Diagnosis not present

## 2022-10-12 DIAGNOSIS — N186 End stage renal disease: Secondary | ICD-10-CM | POA: Diagnosis not present

## 2022-10-14 DIAGNOSIS — N2581 Secondary hyperparathyroidism of renal origin: Secondary | ICD-10-CM | POA: Diagnosis not present

## 2022-10-14 DIAGNOSIS — Z992 Dependence on renal dialysis: Secondary | ICD-10-CM | POA: Diagnosis not present

## 2022-10-14 DIAGNOSIS — N186 End stage renal disease: Secondary | ICD-10-CM | POA: Diagnosis not present

## 2022-10-16 DIAGNOSIS — Z992 Dependence on renal dialysis: Secondary | ICD-10-CM | POA: Diagnosis not present

## 2022-10-16 DIAGNOSIS — N186 End stage renal disease: Secondary | ICD-10-CM | POA: Diagnosis not present

## 2022-10-16 DIAGNOSIS — N2581 Secondary hyperparathyroidism of renal origin: Secondary | ICD-10-CM | POA: Diagnosis not present

## 2022-10-19 DIAGNOSIS — N186 End stage renal disease: Secondary | ICD-10-CM | POA: Diagnosis not present

## 2022-10-19 DIAGNOSIS — N2581 Secondary hyperparathyroidism of renal origin: Secondary | ICD-10-CM | POA: Diagnosis not present

## 2022-10-19 DIAGNOSIS — Z992 Dependence on renal dialysis: Secondary | ICD-10-CM | POA: Diagnosis not present

## 2022-10-21 DIAGNOSIS — N186 End stage renal disease: Secondary | ICD-10-CM | POA: Diagnosis not present

## 2022-10-21 DIAGNOSIS — N2581 Secondary hyperparathyroidism of renal origin: Secondary | ICD-10-CM | POA: Diagnosis not present

## 2022-10-21 DIAGNOSIS — I129 Hypertensive chronic kidney disease with stage 1 through stage 4 chronic kidney disease, or unspecified chronic kidney disease: Secondary | ICD-10-CM | POA: Diagnosis not present

## 2022-10-21 DIAGNOSIS — Z992 Dependence on renal dialysis: Secondary | ICD-10-CM | POA: Diagnosis not present

## 2022-10-23 DIAGNOSIS — Z992 Dependence on renal dialysis: Secondary | ICD-10-CM | POA: Diagnosis not present

## 2022-10-23 DIAGNOSIS — N186 End stage renal disease: Secondary | ICD-10-CM | POA: Diagnosis not present

## 2022-10-23 DIAGNOSIS — N2581 Secondary hyperparathyroidism of renal origin: Secondary | ICD-10-CM | POA: Diagnosis not present

## 2022-10-26 DIAGNOSIS — Z992 Dependence on renal dialysis: Secondary | ICD-10-CM | POA: Diagnosis not present

## 2022-10-26 DIAGNOSIS — N186 End stage renal disease: Secondary | ICD-10-CM | POA: Diagnosis not present

## 2022-10-26 DIAGNOSIS — N2581 Secondary hyperparathyroidism of renal origin: Secondary | ICD-10-CM | POA: Diagnosis not present

## 2022-10-28 DIAGNOSIS — N186 End stage renal disease: Secondary | ICD-10-CM | POA: Diagnosis not present

## 2022-10-28 DIAGNOSIS — Z992 Dependence on renal dialysis: Secondary | ICD-10-CM | POA: Diagnosis not present

## 2022-10-28 DIAGNOSIS — N2581 Secondary hyperparathyroidism of renal origin: Secondary | ICD-10-CM | POA: Diagnosis not present

## 2022-10-30 DIAGNOSIS — N2581 Secondary hyperparathyroidism of renal origin: Secondary | ICD-10-CM | POA: Diagnosis not present

## 2022-10-30 DIAGNOSIS — N186 End stage renal disease: Secondary | ICD-10-CM | POA: Diagnosis not present

## 2022-10-30 DIAGNOSIS — Z992 Dependence on renal dialysis: Secondary | ICD-10-CM | POA: Diagnosis not present

## 2022-11-02 DIAGNOSIS — N186 End stage renal disease: Secondary | ICD-10-CM | POA: Diagnosis not present

## 2022-11-02 DIAGNOSIS — Z992 Dependence on renal dialysis: Secondary | ICD-10-CM | POA: Diagnosis not present

## 2022-11-02 DIAGNOSIS — N2581 Secondary hyperparathyroidism of renal origin: Secondary | ICD-10-CM | POA: Diagnosis not present

## 2022-11-04 DIAGNOSIS — N186 End stage renal disease: Secondary | ICD-10-CM | POA: Diagnosis not present

## 2022-11-04 DIAGNOSIS — N2581 Secondary hyperparathyroidism of renal origin: Secondary | ICD-10-CM | POA: Diagnosis not present

## 2022-11-04 DIAGNOSIS — Z992 Dependence on renal dialysis: Secondary | ICD-10-CM | POA: Diagnosis not present

## 2022-11-06 DIAGNOSIS — N186 End stage renal disease: Secondary | ICD-10-CM | POA: Diagnosis not present

## 2022-11-06 DIAGNOSIS — Z992 Dependence on renal dialysis: Secondary | ICD-10-CM | POA: Diagnosis not present

## 2022-11-06 DIAGNOSIS — N2581 Secondary hyperparathyroidism of renal origin: Secondary | ICD-10-CM | POA: Diagnosis not present

## 2022-11-09 DIAGNOSIS — Z992 Dependence on renal dialysis: Secondary | ICD-10-CM | POA: Diagnosis not present

## 2022-11-09 DIAGNOSIS — N186 End stage renal disease: Secondary | ICD-10-CM | POA: Diagnosis not present

## 2022-11-09 DIAGNOSIS — N2581 Secondary hyperparathyroidism of renal origin: Secondary | ICD-10-CM | POA: Diagnosis not present

## 2022-11-10 DIAGNOSIS — I2510 Atherosclerotic heart disease of native coronary artery without angina pectoris: Secondary | ICD-10-CM | POA: Diagnosis not present

## 2022-11-11 DIAGNOSIS — N2581 Secondary hyperparathyroidism of renal origin: Secondary | ICD-10-CM | POA: Diagnosis not present

## 2022-11-11 DIAGNOSIS — Z992 Dependence on renal dialysis: Secondary | ICD-10-CM | POA: Diagnosis not present

## 2022-11-11 DIAGNOSIS — N186 End stage renal disease: Secondary | ICD-10-CM | POA: Diagnosis not present

## 2022-11-12 DIAGNOSIS — F17210 Nicotine dependence, cigarettes, uncomplicated: Secondary | ICD-10-CM | POA: Diagnosis not present

## 2022-11-12 DIAGNOSIS — J984 Other disorders of lung: Secondary | ICD-10-CM | POA: Diagnosis not present

## 2022-11-13 DIAGNOSIS — N2581 Secondary hyperparathyroidism of renal origin: Secondary | ICD-10-CM | POA: Diagnosis not present

## 2022-11-13 DIAGNOSIS — Z992 Dependence on renal dialysis: Secondary | ICD-10-CM | POA: Diagnosis not present

## 2022-11-13 DIAGNOSIS — N186 End stage renal disease: Secondary | ICD-10-CM | POA: Diagnosis not present

## 2022-11-16 DIAGNOSIS — N2581 Secondary hyperparathyroidism of renal origin: Secondary | ICD-10-CM | POA: Diagnosis not present

## 2022-11-16 DIAGNOSIS — N186 End stage renal disease: Secondary | ICD-10-CM | POA: Diagnosis not present

## 2022-11-16 DIAGNOSIS — Z992 Dependence on renal dialysis: Secondary | ICD-10-CM | POA: Diagnosis not present

## 2022-11-18 DIAGNOSIS — N2581 Secondary hyperparathyroidism of renal origin: Secondary | ICD-10-CM | POA: Diagnosis not present

## 2022-11-18 DIAGNOSIS — N186 End stage renal disease: Secondary | ICD-10-CM | POA: Diagnosis not present

## 2022-11-18 DIAGNOSIS — Z992 Dependence on renal dialysis: Secondary | ICD-10-CM | POA: Diagnosis not present

## 2022-11-20 DIAGNOSIS — N2581 Secondary hyperparathyroidism of renal origin: Secondary | ICD-10-CM | POA: Diagnosis not present

## 2022-11-20 DIAGNOSIS — Z992 Dependence on renal dialysis: Secondary | ICD-10-CM | POA: Diagnosis not present

## 2022-11-20 DIAGNOSIS — N186 End stage renal disease: Secondary | ICD-10-CM | POA: Diagnosis not present

## 2022-11-21 DIAGNOSIS — N186 End stage renal disease: Secondary | ICD-10-CM | POA: Diagnosis not present

## 2022-11-21 DIAGNOSIS — Z992 Dependence on renal dialysis: Secondary | ICD-10-CM | POA: Diagnosis not present

## 2022-11-21 DIAGNOSIS — I129 Hypertensive chronic kidney disease with stage 1 through stage 4 chronic kidney disease, or unspecified chronic kidney disease: Secondary | ICD-10-CM | POA: Diagnosis not present

## 2022-11-23 DIAGNOSIS — N2581 Secondary hyperparathyroidism of renal origin: Secondary | ICD-10-CM | POA: Diagnosis not present

## 2022-11-23 DIAGNOSIS — Z992 Dependence on renal dialysis: Secondary | ICD-10-CM | POA: Diagnosis not present

## 2022-11-23 DIAGNOSIS — N186 End stage renal disease: Secondary | ICD-10-CM | POA: Diagnosis not present

## 2022-11-25 DIAGNOSIS — N2581 Secondary hyperparathyroidism of renal origin: Secondary | ICD-10-CM | POA: Diagnosis not present

## 2022-11-25 DIAGNOSIS — N186 End stage renal disease: Secondary | ICD-10-CM | POA: Diagnosis not present

## 2022-11-25 DIAGNOSIS — Z992 Dependence on renal dialysis: Secondary | ICD-10-CM | POA: Diagnosis not present

## 2022-11-27 DIAGNOSIS — Z992 Dependence on renal dialysis: Secondary | ICD-10-CM | POA: Diagnosis not present

## 2022-11-27 DIAGNOSIS — N2581 Secondary hyperparathyroidism of renal origin: Secondary | ICD-10-CM | POA: Diagnosis not present

## 2022-11-27 DIAGNOSIS — N186 End stage renal disease: Secondary | ICD-10-CM | POA: Diagnosis not present

## 2022-11-30 DIAGNOSIS — N2581 Secondary hyperparathyroidism of renal origin: Secondary | ICD-10-CM | POA: Diagnosis not present

## 2022-11-30 DIAGNOSIS — N186 End stage renal disease: Secondary | ICD-10-CM | POA: Diagnosis not present

## 2022-11-30 DIAGNOSIS — Z992 Dependence on renal dialysis: Secondary | ICD-10-CM | POA: Diagnosis not present

## 2022-12-02 DIAGNOSIS — N2581 Secondary hyperparathyroidism of renal origin: Secondary | ICD-10-CM | POA: Diagnosis not present

## 2022-12-02 DIAGNOSIS — N186 End stage renal disease: Secondary | ICD-10-CM | POA: Diagnosis not present

## 2022-12-02 DIAGNOSIS — Z992 Dependence on renal dialysis: Secondary | ICD-10-CM | POA: Diagnosis not present

## 2022-12-04 DIAGNOSIS — Z992 Dependence on renal dialysis: Secondary | ICD-10-CM | POA: Diagnosis not present

## 2022-12-04 DIAGNOSIS — N186 End stage renal disease: Secondary | ICD-10-CM | POA: Diagnosis not present

## 2022-12-04 DIAGNOSIS — N2581 Secondary hyperparathyroidism of renal origin: Secondary | ICD-10-CM | POA: Diagnosis not present

## 2022-12-07 DIAGNOSIS — Z992 Dependence on renal dialysis: Secondary | ICD-10-CM | POA: Diagnosis not present

## 2022-12-07 DIAGNOSIS — N2581 Secondary hyperparathyroidism of renal origin: Secondary | ICD-10-CM | POA: Diagnosis not present

## 2022-12-07 DIAGNOSIS — N186 End stage renal disease: Secondary | ICD-10-CM | POA: Diagnosis not present

## 2022-12-09 DIAGNOSIS — N186 End stage renal disease: Secondary | ICD-10-CM | POA: Diagnosis not present

## 2022-12-09 DIAGNOSIS — Z992 Dependence on renal dialysis: Secondary | ICD-10-CM | POA: Diagnosis not present

## 2022-12-09 DIAGNOSIS — N2581 Secondary hyperparathyroidism of renal origin: Secondary | ICD-10-CM | POA: Diagnosis not present

## 2022-12-11 DIAGNOSIS — N2581 Secondary hyperparathyroidism of renal origin: Secondary | ICD-10-CM | POA: Diagnosis not present

## 2022-12-11 DIAGNOSIS — N186 End stage renal disease: Secondary | ICD-10-CM | POA: Diagnosis not present

## 2022-12-11 DIAGNOSIS — Z992 Dependence on renal dialysis: Secondary | ICD-10-CM | POA: Diagnosis not present

## 2022-12-14 DIAGNOSIS — N2581 Secondary hyperparathyroidism of renal origin: Secondary | ICD-10-CM | POA: Diagnosis not present

## 2022-12-14 DIAGNOSIS — N186 End stage renal disease: Secondary | ICD-10-CM | POA: Diagnosis not present

## 2022-12-14 DIAGNOSIS — Z992 Dependence on renal dialysis: Secondary | ICD-10-CM | POA: Diagnosis not present

## 2022-12-15 ENCOUNTER — Emergency Department (HOSPITAL_COMMUNITY): Payer: Medicare HMO

## 2022-12-15 ENCOUNTER — Emergency Department (HOSPITAL_COMMUNITY)
Admission: EM | Admit: 2022-12-15 | Discharge: 2022-12-16 | Disposition: A | Discharge status: Home / Self Care | Payer: Medicare HMO | Attending: Emergency Medicine | Admitting: Emergency Medicine

## 2022-12-15 ENCOUNTER — Other Ambulatory Visit: Payer: Self-pay

## 2022-12-15 ENCOUNTER — Encounter (HOSPITAL_COMMUNITY): Payer: Self-pay

## 2022-12-15 DIAGNOSIS — R112 Nausea with vomiting, unspecified: Secondary | ICD-10-CM | POA: Insufficient documentation

## 2022-12-15 DIAGNOSIS — Z992 Dependence on renal dialysis: Secondary | ICD-10-CM | POA: Diagnosis not present

## 2022-12-15 DIAGNOSIS — I132 Hypertensive heart and chronic kidney disease with heart failure and with stage 5 chronic kidney disease, or end stage renal disease: Secondary | ICD-10-CM | POA: Diagnosis not present

## 2022-12-15 DIAGNOSIS — Z7982 Long term (current) use of aspirin: Secondary | ICD-10-CM | POA: Diagnosis not present

## 2022-12-15 DIAGNOSIS — I509 Heart failure, unspecified: Secondary | ICD-10-CM | POA: Diagnosis not present

## 2022-12-15 DIAGNOSIS — R7989 Other specified abnormal findings of blood chemistry: Secondary | ICD-10-CM | POA: Insufficient documentation

## 2022-12-15 DIAGNOSIS — R0602 Shortness of breath: Secondary | ICD-10-CM | POA: Diagnosis not present

## 2022-12-15 DIAGNOSIS — N186 End stage renal disease: Secondary | ICD-10-CM | POA: Diagnosis not present

## 2022-12-15 DIAGNOSIS — Z7951 Long term (current) use of inhaled steroids: Secondary | ICD-10-CM | POA: Diagnosis not present

## 2022-12-15 DIAGNOSIS — R209 Unspecified disturbances of skin sensation: Secondary | ICD-10-CM | POA: Diagnosis not present

## 2022-12-15 DIAGNOSIS — Z79899 Other long term (current) drug therapy: Secondary | ICD-10-CM | POA: Diagnosis not present

## 2022-12-15 DIAGNOSIS — R5383 Other fatigue: Secondary | ICD-10-CM | POA: Insufficient documentation

## 2022-12-15 DIAGNOSIS — R079 Chest pain, unspecified: Secondary | ICD-10-CM | POA: Diagnosis not present

## 2022-12-15 DIAGNOSIS — R0789 Other chest pain: Secondary | ICD-10-CM | POA: Insufficient documentation

## 2022-12-15 LAB — CBC
HCT: 34 % — ABNORMAL LOW (ref 39.0–52.0)
Hemoglobin: 10.9 g/dL — ABNORMAL LOW (ref 13.0–17.0)
MCH: 27 pg (ref 26.0–34.0)
MCHC: 32.1 g/dL (ref 30.0–36.0)
MCV: 84.4 fL (ref 80.0–100.0)
Platelets: 147 10*3/uL — ABNORMAL LOW (ref 150–400)
RBC: 4.03 MIL/uL — ABNORMAL LOW (ref 4.22–5.81)
RDW: 13.3 % (ref 11.5–15.5)
WBC: 5 10*3/uL (ref 4.0–10.5)
nRBC: 0 % (ref 0.0–0.2)

## 2022-12-15 LAB — BASIC METABOLIC PANEL
Anion gap: 9 (ref 5–15)
BUN: 18 mg/dL (ref 8–23)
CO2: 25 mmol/L (ref 22–32)
Calcium: 9.1 mg/dL (ref 8.9–10.3)
Chloride: 91 mmol/L — ABNORMAL LOW (ref 98–111)
Creatinine, Ser: 7.05 mg/dL — ABNORMAL HIGH (ref 0.61–1.24)
GFR, Estimated: 8 mL/min — ABNORMAL LOW (ref >60)
Glucose, Bld: 100 mg/dL — ABNORMAL HIGH (ref 70–99)
Potassium: 4 mmol/L (ref 3.5–5.1)
Sodium: 125 mmol/L — ABNORMAL LOW (ref 135–145)

## 2022-12-15 LAB — TROPONIN I (HIGH SENSITIVITY): Troponin I (High Sensitivity): 22 ng/L — ABNORMAL HIGH (ref <18)

## 2022-12-15 MED ORDER — ACETAMINOPHEN 325 MG PO TABS
650.0000 mg | ORAL_TABLET | ORAL | Status: AC
  Administered 2022-12-15: 650 mg via ORAL
  Filled 2022-12-15: qty 2

## 2022-12-15 NOTE — ED Triage Notes (Signed)
Pt coming in with chest pain that began around 1200 today. Pt endorses dizziness, SOB, diarrhea, and vomiting today. Pt states chest pain is in the center and radiates to left shoulder, he describes the pain as a pressure. He states he has weakness in his legs as well. Pt has hx of CHF and is a dialysis pt (MWF).

## 2022-12-16 LAB — HEPATIC FUNCTION PANEL
ALT: 10 U/L (ref 0–44)
AST: 11 U/L — ABNORMAL LOW (ref 15–41)
Albumin: 3.7 g/dL (ref 3.5–5.0)
Alkaline Phosphatase: 25 U/L — ABNORMAL LOW (ref 38–126)
Bilirubin, Direct: 0.1 mg/dL (ref 0.0–0.2)
Indirect Bilirubin: 0.6 mg/dL (ref 0.3–0.9)
Total Bilirubin: 0.7 mg/dL (ref 0.3–1.2)
Total Protein: 6.3 g/dL — ABNORMAL LOW (ref 6.5–8.1)

## 2022-12-16 LAB — BASIC METABOLIC PANEL
Anion gap: 14 (ref 5–15)
BUN: 20 mg/dL (ref 8–23)
CO2: 21 mmol/L — ABNORMAL LOW (ref 22–32)
Calcium: 9.4 mg/dL (ref 8.9–10.3)
Chloride: 90 mmol/L — ABNORMAL LOW (ref 98–111)
Creatinine, Ser: 7.42 mg/dL — ABNORMAL HIGH (ref 0.61–1.24)
GFR, Estimated: 8 mL/min — ABNORMAL LOW (ref >60)
Glucose, Bld: 76 mg/dL (ref 70–99)
Potassium: 4.1 mmol/L (ref 3.5–5.1)
Sodium: 125 mmol/L — ABNORMAL LOW (ref 135–145)

## 2022-12-16 LAB — TROPONIN I (HIGH SENSITIVITY)
Troponin I (High Sensitivity): 21 ng/L — ABNORMAL HIGH (ref <18)
Troponin I (High Sensitivity): 22 ng/L — ABNORMAL HIGH (ref <18)
Troponin I (High Sensitivity): 16 ng/L (ref <18)

## 2022-12-16 LAB — LIPASE, BLOOD: Lipase: 33 U/L (ref 11–51)

## 2022-12-16 MED ORDER — LIDOCAINE VISCOUS HCL 2 % MT SOLN
15.0000 mL | Freq: Once | OROMUCOSAL | Status: AC
  Administered 2022-12-16: 15 mL via OROMUCOSAL
  Filled 2022-12-16: qty 15

## 2022-12-16 MED ORDER — SODIUM CHLORIDE 0.9 % IV BOLUS
250.0000 mL | Freq: Once | INTRAVENOUS | Status: AC
  Administered 2022-12-16: 250 mL via INTRAVENOUS

## 2022-12-16 MED ORDER — ALUM & MAG HYDROXIDE-SIMETH 200-200-20 MG/5ML PO SUSP
30.0000 mL | Freq: Once | ORAL | Status: AC
  Administered 2022-12-16: 30 mL via ORAL
  Filled 2022-12-16: qty 30

## 2022-12-16 NOTE — Discharge Instructions (Signed)
No evidence of heart attack. No evidence of pneumonia.  Go to dialysis today as scheduled.  Sodium slightly low at 125 you should have this rechecked after dialysis.  Return to the ED with exertional chest pain, pain associate with shortness of breath, nausea, vomiting, sweating or other concerns.

## 2022-12-16 NOTE — ED Provider Notes (Signed)
[place] EMERGENCY DEPARTMENT AT [facility] Provider Note   CSN: [id number] Arrival date & time: 12/15/22  2015     History  Chief Complaint  Patient presents with   Chest Pain    Craig Flynn is a 62 y.o. male.  Patient with a history of ESRD on dialysis, hypertension, nonischemic cardiomyopathy, CHF, sleep apnea, pancreatitis presenting with ongoing chest "pressure" since 12 PM.  Describes pressure in the center of his chest which is constant and radiates to his left arm.  This has been constant all day, nothing makes it better nothing makes it worse.  Occasionally he gets sharp episodes in the center of his chest that last for 4 to 5 seconds at a time and occur randomly.  No radiation of the pain to his neck or back.  His shortness of breath is at baseline.  He is due for dialysis at 5 AM.  With a chest pain onset today he had several episodes of nausea vomiting and diarrhea.  Also has tingling to his bilateral hands and feet.  No travel or sick contacts.  He is being considered for a kidney transplant and recently had a stress test at [facility].  Denies any known cardiac history and no stents in the heart.  He has never had this kind of chest pain before.  Does have a history of reflux but this feels different.  The history is provided by the patient.  Chest Pain Associated symptoms: fatigue, nausea, numbness, shortness of breath and vomiting   Associated symptoms: no fever        Home Medications Prior to Admission medications   Medication Sig Start Date End Date Taking? Authorizing Provider  acetaminophen (TYLENOL) 500 MG tablet Take 1,000 mg by mouth every 6 (six) hours as needed for moderate pain.    [provider]  albuterol (VENTOLIN HFA) 108 (90 Base) MCG/ACT inhaler INHALE 2 PUFFS INTO THE LUNGS EVERY 6 HOURS AS NEEDED FOR WHEEZING OR SHORTNESS OF BREATH Patient taking differently: Inhale 2 puffs into the lungs every 6 (six) hours as needed for  wheezing or shortness of breath. 08/01/20   Corwin Levins, MD  aspirin EC 81 MG tablet Take 81 mg by mouth daily.    [provider]  carvedilol (COREG) 25 MG tablet TAKE 1 TABLET(25 MG) BY MOUTH TWICE DAILY 12/23/21   Corwin Levins, MD  diazepam (VALIUM) 5 MG tablet 1 tab by mouth 45 min prior to MRI Patient not taking: Reported on 05/17/2022 05/23/21   Corwin Levins, MD  fenofibrate 54 MG tablet TAKE 1 TABLET BY MOUTH EVERY DAY Patient not taking: Reported on 05/17/2022 08/07/20   Rollene Rotunda, MD  furosemide (LASIX) 80 MG tablet Take 80 mg by mouth daily. 02/02/22   [provider]  gabapentin (NEURONTIN) 300 MG capsule Take 1 capsule (300 mg total) by mouth at bedtime. 09/25/22   Corwin Levins, MD  ibuprofen (ADVIL) 800 MG tablet Take 1 tablet (800 mg total) by mouth every 8 (eight) hours as needed. Patient not taking: Reported on 05/17/2022 02/25/22   Corwin Levins, MD  isosorbide mononitrate (IMDUR) 30 MG 24 hr tablet TAKE 2 TABLETS BY MOUTH DAILY. SCHEDULE OFFICE VISIT FOR FUTURE REFILLS 08/21/22   Corwin Levins, MD  ketoconazole (NIZORAL) 2 % cream Apply 1 application topically daily. Patient not taking: Reported on 05/17/2022 02/11/21   Louann Sjogren, DPM  levothyroxine (SYNTHROID) 125 MCG tablet Take 1 tablet (125  mcg total) by mouth every morning. 05/07/22   Corwin Levins, MD  meclizine (ANTIVERT) 25 MG tablet Take 1 tablet (25 mg total) by mouth 3 (three) times daily as needed for dizziness. Patient not taking: Reported on 05/17/2022 11/07/21   Corwin Levins, MD  midodrine (PROAMATINE) 2.5 MG tablet Take 1 tablet (2.5 mg total) by mouth 3 (three) times daily with meals. Patient not taking: Reported on 05/17/2022 11/07/21   Corwin Levins, MD  nitroGLYCERIN (NITROSTAT) 0.4 MG SL tablet DISSOLVE 1 TABLET UNDER THE TONGUE EVERY 5 MINUTES AS NEEDED FOR CHEST PAIN Patient taking differently: Place 0.4 mg under the tongue every 5 (five) minutes as needed for chest pain. 03/29/19    Sarita Haver, MD  pantoprazole (PROTONIX) 40 MG tablet Take 1 tablet (40 mg total) by mouth daily. Patient taking differently: Take 80 mg by mouth daily. 10/21/21   Corwin Levins, MD  thiamine (VITAMIN B-1) 50 MG tablet Take 1 tablet (50 mg total) by mouth daily. Patient not taking: Reported on 05/17/2022 03/25/20   Etta Grandchild, MD  tiZANidine (ZANAFLEX) 2 MG tablet Take 1 tablet (2 mg total) by mouth daily. Patient not taking: Reported on 05/17/2022 02/20/22   Raspet, Noberto Retort, PA-C      Allergies    Patient has no known allergies.    Review of Systems   Review of Systems  Constitutional:  Positive for fatigue. Negative for activity change, appetite change and fever.  HENT:  Negative for congestion and rhinorrhea.   Respiratory:  Positive for chest tightness and shortness of breath.   Cardiovascular:  Positive for chest pain. Negative for leg swelling.  Gastrointestinal:  Positive for diarrhea, nausea and vomiting.  Genitourinary:  Negative for dysuria and hematuria.  Neurological:  Positive for numbness.   all other systems are negative except as noted in the HPI and PMH.    Physical Exam Updated Vital Signs BP (!) 158/65 (BP Location: Right Arm)   Pulse 63   Temp 98.3 F (36.8 C) (Oral)   Resp 20   Ht 6\' 2"  (1.88 m)   Wt 115.7 kg   SpO2 100%   BMI 32.74 kg/m  Physical Exam Vitals and nursing note reviewed.  Constitutional:      General: He is not in acute distress.    Appearance: He is well-developed. He is not ill-appearing.  HENT:     Head: Normocephalic and atraumatic.     Mouth/Throat:     Pharynx: No oropharyngeal exudate.  Eyes:     Conjunctiva/sclera: Conjunctivae normal.     Pupils: Pupils are equal, round, and reactive to light.  Neck:     Comments: No meningismus. Cardiovascular:     Rate and Rhythm: Normal rate and regular rhythm.     Heart sounds: Normal heart sounds. No murmur heard. Pulmonary:     Effort: Pulmonary effort is normal. No  respiratory distress.     Breath sounds: Normal breath sounds.  Chest:     Chest wall: No tenderness.  Abdominal:     Palpations: Abdomen is soft.     Tenderness: There is no abdominal tenderness. There is no guarding or rebound.  Musculoskeletal:        General: No tenderness. Normal range of motion.     Cervical back: Normal range of motion and neck supple.     Comments: Equal radial, DP and PT pulses bilaterally.  Left upper extremity fistula with intact thrill and bruit  Skin:    General: Skin is warm.  Neurological:     Mental Status: He is alert and oriented to person, place, and time.     Cranial Nerves: No cranial nerve deficit.     Motor: No abnormal muscle tone.     Coordination: Coordination normal.     Comments:  5/5 strength throughout. CN 2-12 intact.Equal grip strength.   Psychiatric:        Behavior: Behavior normal.     ED Results / Procedures / Treatments   Labs (all labs ordered are listed, but only abnormal results are displayed) Labs Reviewed  BASIC METABOLIC PANEL - Abnormal; Notable for the following components:      Result Value   Sodium 125 (*)    Chloride 91 (*)    Glucose, Bld 100 (*)    Creatinine, Ser 7.05 (*)    GFR, Estimated 8 (*)    All other components within normal limits  CBC - Abnormal; Notable for the following components:   RBC 4.03 (*)    Hemoglobin 10.9 (*)    HCT 34.0 (*)    Platelets 147 (*)    All other components within normal limits  HEPATIC FUNCTION PANEL - Abnormal; Notable for the following components:   Total Protein 6.3 (*)    AST 11 (*)    Alkaline Phosphatase 25 (*)    All other components within normal limits  BASIC METABOLIC PANEL - Abnormal; Notable for the following components:   Sodium 125 (*)    Chloride 90 (*)    CO2 21 (*)    Creatinine, Ser 7.42 (*)    GFR, Estimated 8 (*)    All other components within normal limits  TROPONIN I (HIGH SENSITIVITY) - Abnormal; Notable for the following components:    Troponin I (High Sensitivity) 22 (*)    All other components within normal limits  TROPONIN I (HIGH SENSITIVITY) - Abnormal; Notable for the following components:   Troponin I (High Sensitivity) 21 (*)    All other components within normal limits  TROPONIN I (HIGH SENSITIVITY) - Abnormal; Notable for the following components:   Troponin I (High Sensitivity) 22 (*)    All other components within normal limits  LIPASE, BLOOD  TROPONIN I (HIGH SENSITIVITY)    EKG EKG Interpretation Date/Time:  Tuesday December 15 2022 20:29:50 EDT Ventricular Rate:  72 PR Interval:  212 QRS Duration:  94 QT Interval:  416 QTC Calculation: 455 R Axis:   20  Text Interpretation: Sinus rhythm with 1st degree A-V block Otherwise normal ECG When compared with ECG of 16-May-2022 22:55, No significant change was found Confirmed by Dione Booze ([id number]) on 12/16/2022 12:14:34 AM  Radiology DG Chest 2 View  Result Date: 12/15/2022 CLINICAL DATA:  Chest pain EXAM: CHEST - 2 VIEW COMPARISON:  None Available. FINDINGS: The heart size and mediastinal contours are within normal limits. Both lungs are clear. The visualized skeletal structures are unremarkable. IMPRESSION: No active cardiopulmonary disease. Electronically Signed   By: Helyn Numbers M.D.   On: 12/15/2022 21:27    Procedures Procedures    Medications Ordered in ED Medications  alum & mag hydroxide-simeth (MAALOX/MYLANTA) 200-200-20 MG/5ML suspension 30 mL (has no administration in time range)  lidocaine (XYLOCAINE) 2 % viscous mouth solution 15 mL (has no administration in time range)  sodium chloride 0.9 % bolus 250 mL (has no administration in time range)  acetaminophen (TYLENOL) tablet 650 mg (650 mg Oral Given 12/15/22 2351)  ED Course/ Medical Decision Making/ A&P                                 Medical Decision Making Amount and/or Complexity of Data Reviewed Labs: ordered. Decision-making details documented in ED Course. Radiology:  ordered and independent interpretation performed. Decision-making details documented in ED Course. ECG/medicine tests: ordered and independent interpretation performed. Decision-making details documented in ED Course.  Risk OTC drugs. Prescription drug management.   Ongoing central chest pain and pressure since 12 PM.  Associated with shortness of breath, nausea, vomiting and diarrhea.  Vital stable, no distress, EKG without acute ST elevation.  Labs obtained from triage show minimal troponin elevation of 21.  No significant leukocytosis.  LFTs and lipase are normal.  Chest x-ray negative for infiltrate or edema.  Results reviewed  and interpreted by me.  Outside records reviewed.  Patient did have stress test at [facility] on August 20 Impression  1. No reversible ischemia or infarction.  2. Normal left ventricular wall motion.  3. Left ventricular ejection fraction 57%  4. Non invasive risk stratification*: Low  *2012 Appropriate Use Criteria for Coronary Revascularization Focused Update: J Am Coll Cardiol. 2012;59(9):857-881. http://content.dementiazones.com.aspx?articleid=1201161  Labs today are reassuring without significant troponin elevation.  Low suspicion for ACS.  Low suspicion for PE.  No hypoxia or tachycardia.  Hyponatremia 125 is slightly below his baseline of 130. Likely hypervolemic, due for dialysis this AM.  Chest pain has resolved after GI cocktail. No further vomiting or diarrhea.   ACS unlikely with flat troponin x3 and recent reassuring stress test.   Continue PPI. Avoid alcohol, caffeine, NSAID medications, spicy foods.  Appears stable to go to dialysis today. Return to the ED with exertion chest pain, pain associated with SOB, nausea, vomiting, diaphoresis or any other concerns.        Final Clinical Impression(s) / ED Diagnoses Final diagnoses:  Atypical chest pain    Rx / DC Orders ED Discharge Orders     None          Neila Teem, Jeannett Senior, MD 12/16/22 1606

## 2022-12-18 DIAGNOSIS — N186 End stage renal disease: Secondary | ICD-10-CM | POA: Diagnosis not present

## 2022-12-18 DIAGNOSIS — N2581 Secondary hyperparathyroidism of renal origin: Secondary | ICD-10-CM | POA: Diagnosis not present

## 2022-12-18 DIAGNOSIS — Z992 Dependence on renal dialysis: Secondary | ICD-10-CM | POA: Diagnosis not present

## 2022-12-21 DIAGNOSIS — N2581 Secondary hyperparathyroidism of renal origin: Secondary | ICD-10-CM | POA: Diagnosis not present

## 2022-12-21 DIAGNOSIS — I129 Hypertensive chronic kidney disease with stage 1 through stage 4 chronic kidney disease, or unspecified chronic kidney disease: Secondary | ICD-10-CM | POA: Diagnosis not present

## 2022-12-21 DIAGNOSIS — Z992 Dependence on renal dialysis: Secondary | ICD-10-CM | POA: Diagnosis not present

## 2022-12-21 DIAGNOSIS — N186 End stage renal disease: Secondary | ICD-10-CM | POA: Diagnosis not present

## 2022-12-23 DIAGNOSIS — N186 End stage renal disease: Secondary | ICD-10-CM | POA: Diagnosis not present

## 2022-12-23 DIAGNOSIS — N2581 Secondary hyperparathyroidism of renal origin: Secondary | ICD-10-CM | POA: Diagnosis not present

## 2022-12-23 DIAGNOSIS — Z992 Dependence on renal dialysis: Secondary | ICD-10-CM | POA: Diagnosis not present

## 2022-12-25 DIAGNOSIS — N186 End stage renal disease: Secondary | ICD-10-CM | POA: Diagnosis not present

## 2022-12-25 DIAGNOSIS — N2581 Secondary hyperparathyroidism of renal origin: Secondary | ICD-10-CM | POA: Diagnosis not present

## 2022-12-25 DIAGNOSIS — Z992 Dependence on renal dialysis: Secondary | ICD-10-CM | POA: Diagnosis not present

## 2022-12-28 DIAGNOSIS — N186 End stage renal disease: Secondary | ICD-10-CM | POA: Diagnosis not present

## 2022-12-28 DIAGNOSIS — Z992 Dependence on renal dialysis: Secondary | ICD-10-CM | POA: Diagnosis not present

## 2022-12-28 DIAGNOSIS — N2581 Secondary hyperparathyroidism of renal origin: Secondary | ICD-10-CM | POA: Diagnosis not present

## 2022-12-30 ENCOUNTER — Emergency Department (HOSPITAL_COMMUNITY)
Admission: EM | Admit: 2022-12-30 | Discharge: 2022-12-30 | Disposition: A | Discharge status: Home / Self Care | Payer: Medicare HMO | Attending: Emergency Medicine | Admitting: Emergency Medicine

## 2022-12-30 ENCOUNTER — Encounter (HOSPITAL_COMMUNITY): Payer: Self-pay

## 2022-12-30 ENCOUNTER — Emergency Department (HOSPITAL_COMMUNITY): Payer: Medicare HMO

## 2022-12-30 ENCOUNTER — Other Ambulatory Visit: Payer: Self-pay

## 2022-12-30 DIAGNOSIS — R079 Chest pain, unspecified: Secondary | ICD-10-CM | POA: Diagnosis not present

## 2022-12-30 DIAGNOSIS — Z992 Dependence on renal dialysis: Secondary | ICD-10-CM | POA: Insufficient documentation

## 2022-12-30 DIAGNOSIS — N2581 Secondary hyperparathyroidism of renal origin: Secondary | ICD-10-CM | POA: Diagnosis not present

## 2022-12-30 DIAGNOSIS — Z79899 Other long term (current) drug therapy: Secondary | ICD-10-CM | POA: Insufficient documentation

## 2022-12-30 DIAGNOSIS — I132 Hypertensive heart and chronic kidney disease with heart failure and with stage 5 chronic kidney disease, or end stage renal disease: Secondary | ICD-10-CM | POA: Diagnosis not present

## 2022-12-30 DIAGNOSIS — M940 Chondrocostal junction syndrome [Tietze]: Secondary | ICD-10-CM | POA: Diagnosis not present

## 2022-12-30 DIAGNOSIS — R0789 Other chest pain: Secondary | ICD-10-CM

## 2022-12-30 DIAGNOSIS — Z7982 Long term (current) use of aspirin: Secondary | ICD-10-CM | POA: Insufficient documentation

## 2022-12-30 DIAGNOSIS — I509 Heart failure, unspecified: Secondary | ICD-10-CM | POA: Diagnosis not present

## 2022-12-30 DIAGNOSIS — N186 End stage renal disease: Secondary | ICD-10-CM | POA: Diagnosis not present

## 2022-12-30 LAB — CBC
HCT: 34.7 % — ABNORMAL LOW (ref 39.0–52.0)
Hemoglobin: 11.1 g/dL — ABNORMAL LOW (ref 13.0–17.0)
MCH: 27.6 pg (ref 26.0–34.0)
MCHC: 32 g/dL (ref 30.0–36.0)
MCV: 86.3 fL (ref 80.0–100.0)
Platelets: 149 10*3/uL — ABNORMAL LOW (ref 150–400)
RBC: 4.02 MIL/uL — ABNORMAL LOW (ref 4.22–5.81)
RDW: 13.7 % (ref 11.5–15.5)
WBC: 3.5 10*3/uL — ABNORMAL LOW (ref 4.0–10.5)
nRBC: 0 % (ref 0.0–0.2)

## 2022-12-30 LAB — BASIC METABOLIC PANEL
Anion gap: 12 (ref 5–15)
BUN: 17 mg/dL (ref 8–23)
CO2: 27 mmol/L (ref 22–32)
Calcium: 9.4 mg/dL (ref 8.9–10.3)
Chloride: 92 mmol/L — ABNORMAL LOW (ref 98–111)
Creatinine, Ser: 4.69 mg/dL — ABNORMAL HIGH (ref 0.61–1.24)
GFR, Estimated: 13 mL/min — ABNORMAL LOW (ref >60)
Glucose, Bld: 90 mg/dL (ref 70–99)
Potassium: 3.7 mmol/L (ref 3.5–5.1)
Sodium: 131 mmol/L — ABNORMAL LOW (ref 135–145)

## 2022-12-30 LAB — TROPONIN I (HIGH SENSITIVITY)
Troponin I (High Sensitivity): 15 ng/L (ref <18)
Troponin I (High Sensitivity): 12 ng/L (ref <18)

## 2022-12-30 MED ORDER — ALUM & MAG HYDROXIDE-SIMETH 200-200-20 MG/5ML PO SUSP
30.0000 mL | Freq: Once | ORAL | Status: AC
  Administered 2022-12-30: 30 mL via ORAL
  Filled 2022-12-30: qty 30

## 2022-12-30 MED ORDER — KETOROLAC TROMETHAMINE 60 MG/2ML IM SOLN: 30.0000 mg | Freq: Once | INTRAMUSCULAR | Status: DC

## 2022-12-30 MED ORDER — LIDOCAINE VISCOUS HCL 2 % MT SOLN
15.0000 mL | Freq: Once | OROMUCOSAL | Status: AC
  Administered 2022-12-30: 15 mL via ORAL
  Filled 2022-12-30: qty 15

## 2022-12-30 MED ORDER — HYDROCODONE-ACETAMINOPHEN 5-325 MG PO TABS
1.0000 | ORAL_TABLET | Freq: Once | ORAL | Status: AC
  Administered 2022-12-30: 1 via ORAL
  Filled 2022-12-30: qty 1

## 2022-12-30 NOTE — Discharge Instructions (Addendum)
Your symptoms of chest wall pain are consistent with costochondritis. Recommend a lidocaine patch and Tylenol for pain control. NSAIDs can also help improve the pain of chest wall inflammation

## 2022-12-30 NOTE — ED Provider Notes (Signed)
[place] EMERGENCY DEPARTMENT AT [facility] Provider Note   CSN: [id number] Arrival date & time: 12/30/22  1119     History  Chief Complaint  Patient presents with   Chest Pain    Craig Flynn is a 62 y.o. male.   Chest Pain    62 year old male with medical history significant for hypertension, CHF, morbid obesity, ESRD on HD MWF who presents to the emergency department with chest pain during dialysis.  The patient endorses a sharp pain along his bilateral chest, reproducible to palpation that started during dialysis.  He denies any shortness of breath, cough, fevers or chills.  The pain is not exertional.  He denies any falls or injury to his chest.  Home Medications Prior to Admission medications   Medication Sig Start Date End Date Taking? Authorizing Provider  acetaminophen (TYLENOL) 500 MG tablet Take 1,000 mg by mouth every 6 (six) hours as needed for moderate pain.    [provider]  albuterol (VENTOLIN HFA) 108 (90 Base) MCG/ACT inhaler INHALE 2 PUFFS INTO THE LUNGS EVERY 6 HOURS AS NEEDED FOR WHEEZING OR SHORTNESS OF BREATH Patient taking differently: Inhale 2 puffs into the lungs every 6 (six) hours as needed for wheezing or shortness of breath. 08/01/20   Corwin Levins, MD  aspirin EC 81 MG tablet Take 81 mg by mouth daily.    [provider]  carvedilol (COREG) 25 MG tablet TAKE 1 TABLET(25 MG) BY MOUTH TWICE DAILY 12/23/21   Corwin Levins, MD  diazepam (VALIUM) 5 MG tablet 1 tab by mouth 45 min prior to MRI Patient not taking: Reported on 05/17/2022 05/23/21   Corwin Levins, MD  fenofibrate 54 MG tablet TAKE 1 TABLET BY MOUTH EVERY DAY Patient not taking: Reported on 05/17/2022 08/07/20   Rollene Rotunda, MD  furosemide (LASIX) 80 MG tablet Take 80 mg by mouth daily. 02/02/22   [provider]  gabapentin (NEURONTIN) 300 MG capsule Take 1 capsule (300 mg total) by mouth at bedtime. 09/25/22   Corwin Levins, MD  ibuprofen (ADVIL) 800  MG tablet Take 1 tablet (800 mg total) by mouth every 8 (eight) hours as needed. Patient not taking: Reported on 05/17/2022 02/25/22   Corwin Levins, MD  isosorbide mononitrate (IMDUR) 30 MG 24 hr tablet TAKE 2 TABLETS BY MOUTH DAILY. SCHEDULE OFFICE VISIT FOR FUTURE REFILLS 08/21/22   Corwin Levins, MD  ketoconazole (NIZORAL) 2 % cream Apply 1 application topically daily. Patient not taking: Reported on 05/17/2022 02/11/21   Louann Sjogren, DPM  levothyroxine (SYNTHROID) 125 MCG tablet Take 1 tablet (125 mcg total) by mouth every morning. 05/07/22   Corwin Levins, MD  meclizine (ANTIVERT) 25 MG tablet Take 1 tablet (25 mg total) by mouth 3 (three) times daily as needed for dizziness. Patient not taking: Reported on 05/17/2022 11/07/21   Corwin Levins, MD  midodrine (PROAMATINE) 2.5 MG tablet Take 1 tablet (2.5 mg total) by mouth 3 (three) times daily with meals. Patient not taking: Reported on 05/17/2022 11/07/21   Corwin Levins, MD  nitroGLYCERIN (NITROSTAT) 0.4 MG SL tablet DISSOLVE 1 TABLET UNDER THE TONGUE EVERY 5 MINUTES AS NEEDED FOR CHEST PAIN Patient taking differently: Place 0.4 mg under the tongue every 5 (five) minutes as needed for chest pain. 03/29/19   Sarita Haver, MD  pantoprazole (PROTONIX) 40 MG tablet Take 1 tablet (40 mg total) by mouth daily. Patient taking differently: Take 80 mg by  mouth daily. 10/21/21   Corwin Levins, MD  thiamine (VITAMIN B-1) 50 MG tablet Take 1 tablet (50 mg total) by mouth daily. Patient not taking: Reported on 05/17/2022 03/25/20   Etta Grandchild, MD  tiZANidine (ZANAFLEX) 2 MG tablet Take 1 tablet (2 mg total) by mouth daily. Patient not taking: Reported on 05/17/2022 02/20/22   Raspet, Noberto Retort, PA-C      Allergies    Patient has no known allergies.    Review of Systems   Review of Systems  Cardiovascular:  Positive for chest pain.  All other systems reviewed and are negative.   Physical Exam Updated Vital Signs BP (!) 143/65   Pulse 80    Temp 97.6 F (36.4 C) (Oral)   Resp 18   Ht 6\' 2"  (1.88 m)   Wt 120.8 kg   SpO2 100%   BMI 34.19 kg/m  Physical Exam Vitals and nursing note reviewed.  Constitutional:      General: He is not in acute distress.    Appearance: He is well-developed.     Comments: Pleasant, nontoxic, alert  HENT:     Head: Normocephalic and atraumatic.  Eyes:     Conjunctiva/sclera: Conjunctivae normal.  Cardiovascular:     Rate and Rhythm: Normal rate and regular rhythm.     Heart sounds: No murmur heard. Pulmonary:     Effort: Pulmonary effort is normal. No respiratory distress.     Breath sounds: Normal breath sounds.  Chest:     Comments: Reproducible bilateral parasternal chest wall tenderness to palpation Abdominal:     Palpations: Abdomen is soft.     Tenderness: There is no abdominal tenderness.  Musculoskeletal:        General: No swelling.     Cervical back: Neck supple.  Skin:    General: Skin is warm and dry.     Capillary Refill: Capillary refill takes less than 2 seconds.  Neurological:     Mental Status: He is alert.  Psychiatric:        Mood and Affect: Mood normal.     ED Results / Procedures / Treatments   Labs (all labs ordered are listed, but only abnormal results are displayed) Labs Reviewed  BASIC METABOLIC PANEL - Abnormal; Notable for the following components:      Result Value   Sodium 131 (*)    Chloride 92 (*)    Creatinine, Ser 4.69 (*)    GFR, Estimated 13 (*)    All other components within normal limits  CBC - Abnormal; Notable for the following components:   WBC 3.5 (*)    RBC 4.02 (*)    Hemoglobin 11.1 (*)    HCT 34.7 (*)    Platelets 149 (*)    All other components within normal limits  TROPONIN I (HIGH SENSITIVITY)  TROPONIN I (HIGH SENSITIVITY)    EKG None  Radiology DG Chest 2 View  Result Date: 12/30/2022 CLINICAL DATA:  Chest pain EXAM: CHEST - 2 VIEW COMPARISON:  X-ray 12/15/2022 and older FINDINGS: No consolidation,  pneumothorax or effusion. No edema. Eventration of the left hemidiaphragm. Normal cardiopericardial silhouette without edema. Surgical clips in the upper abdomen. IMPRESSION: No acute cardiopulmonary disease. Electronically Signed   By: Karen Kays M.D.   On: 12/30/2022 13:05    Procedures Procedures    Medications Ordered in ED Medications - No data to display  ED Course/ Medical Decision Making/ A&P  Medical Decision Making Amount and/or Complexity of Data Reviewed Labs: ordered. Radiology: ordered.  Risk OTC drugs. Prescription drug management.    62 year old male with medical history significant for hypertension, CHF, morbid obesity, ESRD on HD MWF who presents to the emergency department with chest pain during dialysis.  The patient endorses a sharp pain along his bilateral chest, reproducible to palpation that started during dialysis.  He denies any shortness of breath, cough, fevers or chills.  The pain is not exertional.  He denies any falls or injury to his chest.  Medical Decision Making: Craig Flynn is a 62 y.o. male who presented to the ED today with chest pain, detailed above.  Based on patient's comorbidities, patient has a heart score of ***.    {crccomplexity:27900} Complete initial physical exam performed, notably the patient was ***.   Reviewed and confirmed nursing documentation for past medical history, family history, social history.    Initial Assessment: With the patient's presentation of left-sided chest pain, most likely diagnosis is musculoskeletal chest pain versus GERD, although ACS remains on the differential. Other diagnoses were considered including (but not limited to) pulmonary embolism, community-acquired pneumonia, aortic dissection, pneumothorax, underlying bony abnormality, anemia. These are considered less likely due to history of present illness and physical exam findings.    ***In particular, concerning  pulmonary embolism: Patient is PERC *** and the they deny malignancy, recent surgery, history of DVT, or calf tenderness leading to a *** risk Wells score. ***Aortic Dissection also considered but seems less likely based on the ***location, quality, onset, and severity of symptoms in this case.   Initial Plan: Evaluate for ACS with delta***single troponin and EKG evaluated as below  Evaluate for dissection, bony abnormality, or pneumonia with chest x-ray and screening laboratory evaluation including CBC, BMP  Further evaluation for pulmonary embolism ***not indicated at this time based on patient's PERC and Wells score.  Further evaluation for Thoracic Aortic Dissection ***not indicated at this time based on patient's clinical history and PE findings.   Initial Study Results: ***EKG was reviewed independently. Rate, rhythm, axis, intervals all examined and without medically relevant abnormality. ST segments without concerns for elevations.    Laboratory  Delta ***Single troponin demonstrated***   CBC and BMP without obvious metabolic or inflammatory abnormalities requiring further evaluation   Radiology  DG Chest 2 View  Result Date: 12/30/2022 CLINICAL DATA:  Chest pain EXAM: CHEST - 2 VIEW COMPARISON:  X-ray 12/15/2022 and older FINDINGS: No consolidation, pneumothorax or effusion. No edema. Eventration of the left hemidiaphragm. Normal cardiopericardial silhouette without edema. Surgical clips in the upper abdomen. IMPRESSION: No acute cardiopulmonary disease. Electronically Signed   By: Karen Kays M.D.   On: 12/30/2022 13:05   DG Chest 2 View  Result Date: 12/15/2022 CLINICAL DATA:  Chest pain EXAM: CHEST - 2 VIEW COMPARISON:  None Available. FINDINGS: The heart size and mediastinal contours are within normal limits. Both lungs are clear. The visualized skeletal structures are unremarkable. IMPRESSION: No active cardiopulmonary disease. Electronically Signed   By: Helyn Numbers M.D.    On: 12/15/2022 21:27    Final Assessment and Plan: ***    {Document critical care time when appropriate:1} {Document review of labs and clinical decision tools ie heart score, Chads2Vasc2 etc:1}  {Document your independent review of radiology images, and any outside records:1} {Document your discussion with family members, caretakers, and with consultants:1} {Document social determinants of health affecting pt's care:1} {Document your decision making why or why not admission,  treatments were needed:1} Final Clinical Impression(s) / ED Diagnoses Final diagnoses:  None    Rx / DC Orders ED Discharge Orders     None

## 2022-12-30 NOTE — ED Triage Notes (Signed)
Chest pain in center of chest that started this AM, denies pain radiating. Pt states he had dialysis this AM and it was hurting prior. Took 1 nitroglycerin prior to arrival. C/o sob all the time, no more sob than usual.

## 2023-01-01 DIAGNOSIS — N2581 Secondary hyperparathyroidism of renal origin: Secondary | ICD-10-CM | POA: Diagnosis not present

## 2023-01-01 DIAGNOSIS — Z992 Dependence on renal dialysis: Secondary | ICD-10-CM | POA: Diagnosis not present

## 2023-01-01 DIAGNOSIS — N186 End stage renal disease: Secondary | ICD-10-CM | POA: Diagnosis not present

## 2023-01-04 DIAGNOSIS — N186 End stage renal disease: Secondary | ICD-10-CM | POA: Diagnosis not present

## 2023-01-04 DIAGNOSIS — N2581 Secondary hyperparathyroidism of renal origin: Secondary | ICD-10-CM | POA: Diagnosis not present

## 2023-01-04 DIAGNOSIS — Z992 Dependence on renal dialysis: Secondary | ICD-10-CM | POA: Diagnosis not present

## 2023-01-06 DIAGNOSIS — Z992 Dependence on renal dialysis: Secondary | ICD-10-CM | POA: Diagnosis not present

## 2023-01-06 DIAGNOSIS — N2581 Secondary hyperparathyroidism of renal origin: Secondary | ICD-10-CM | POA: Diagnosis not present

## 2023-01-06 DIAGNOSIS — N186 End stage renal disease: Secondary | ICD-10-CM | POA: Diagnosis not present

## 2023-01-08 DIAGNOSIS — N186 End stage renal disease: Secondary | ICD-10-CM | POA: Diagnosis not present

## 2023-01-08 DIAGNOSIS — Z992 Dependence on renal dialysis: Secondary | ICD-10-CM | POA: Diagnosis not present

## 2023-01-08 DIAGNOSIS — N2581 Secondary hyperparathyroidism of renal origin: Secondary | ICD-10-CM | POA: Diagnosis not present

## 2023-01-11 DIAGNOSIS — Z992 Dependence on renal dialysis: Secondary | ICD-10-CM | POA: Diagnosis not present

## 2023-01-11 DIAGNOSIS — N2581 Secondary hyperparathyroidism of renal origin: Secondary | ICD-10-CM | POA: Diagnosis not present

## 2023-01-11 DIAGNOSIS — N186 End stage renal disease: Secondary | ICD-10-CM | POA: Diagnosis not present

## 2023-01-11 NOTE — Progress Notes (Deleted)
Cardiology Office Note:   Date:  01/11/2023  ID:  [name], DOB [birth year], MRN [id number] PCP: Corwin Levins, MD  [facility] Providers Cardiologist:  Rollene Rotunda, MD {  History of Present Illness:   Craig Flynn is a 62 y.o. male  who presents for evaluation of cardiomyopathy and hypertension. The patient has long-standing hypertension. He was seen a couple of years ago with mildly reduced ejection fraction of about 45%. In July 2016 his EF was said to be about 55%.    Perfusion  study in 2017 demonstrated EF of 47% with a fixed inferior wall defect.   I sent him for a POET (Plain Old Exercise Treadmill) which was negative but he had a suboptimal heart rate response.   This was May of last year.  Echo in April of 2021 demonstrated an EF of 60 to 65%.  He was in the emergency room in September for chest pain.  I reviewed these records.  He had a flat troponin that was minimally elevated.  It was thought to have costochondritis and was treated with prednisone taper along with topical nonsteroidals.  He did have an echo follow up in April 2024 with normal EF and no other abnormalities.    I have not seen him since 2022.   He has been in the ED 3 times this year with chest pain.  It was not thought to be angina and there was no objective evidence of ischemia at these visits. ***  *** Since I last saw him he has continued to have the sharp shooting chest discomfort sporadically in his mid chest.  He feels like there is are not.  He did not really improve with steroid taper or nonsteroidal cream.  However, its not reproducible with exertion.  He is not describing neck discomfort or arm discomfort but has had no new shortness of breath, PND or orthopnea.  He walks a mile a day with his son and is not a started golfing soon.    ROS: ***  Studies Reviewed:    EKG:       ***  Risk Assessment/Calculations:   {Does this patient have ATRIAL FIBRILLATION?[phone]} No BP recorded.   {Refresh Note OR Click here to enter BP  :1}***        Physical Exam:   VS:  There were no vitals taken for this visit.   Wt Readings from Last 3 Encounters:  12/30/22 266 lb 5.1 oz (120.8 kg)  12/15/22 255 lb (115.7 kg)  05/16/22 262 lb (118.8 kg)     GEN: Well nourished, well developed in no acute distress NECK: No JVD; No carotid bruits CARDIAC: ***RRR, no murmurs, rubs, gallops RESPIRATORY:  Clear to auscultation without rales, wheezing or rhonchi  ABDOMEN: Soft, non-tender, non-distended EXTREMITIES:  No edema; No deformity   ASSESSMENT AND PLAN:   CARDIOMYOPATHY:   EF was normal in April 2023.  ***   in April 2021.  No change in therapy or further imaging is indicated.   HTN:   Blood pressure is ***  at target.  No change in therapy.    OBESITY:  ***  He has maintained his weight loss and I encouraged more of the same.    CKD:     ***   He tolerates dialysis.    CHEST PAIN:    ***  This is nonanginal.  No further cardiac work-up.     {Are you ordering a CV Procedure (e.g. stress  test, cath, DCCV, TEE, etc)?   Press F2        :[id number]}  Follow up ***  Signed, Rollene Rotunda, MD

## 2023-01-12 ENCOUNTER — Ambulatory Visit: Payer: Medicare HMO | Attending: Cardiology | Admitting: Cardiology

## 2023-01-12 DIAGNOSIS — N185 Chronic kidney disease, stage 5: Secondary | ICD-10-CM

## 2023-01-12 DIAGNOSIS — I10 Essential (primary) hypertension: Secondary | ICD-10-CM

## 2023-01-12 DIAGNOSIS — R072 Precordial pain: Secondary | ICD-10-CM

## 2023-01-13 DIAGNOSIS — N2581 Secondary hyperparathyroidism of renal origin: Secondary | ICD-10-CM | POA: Diagnosis not present

## 2023-01-13 DIAGNOSIS — Z992 Dependence on renal dialysis: Secondary | ICD-10-CM | POA: Diagnosis not present

## 2023-01-13 DIAGNOSIS — H401122 Primary open-angle glaucoma, left eye, moderate stage: Secondary | ICD-10-CM | POA: Diagnosis not present

## 2023-01-13 DIAGNOSIS — N186 End stage renal disease: Secondary | ICD-10-CM | POA: Diagnosis not present

## 2023-01-15 DIAGNOSIS — N186 End stage renal disease: Secondary | ICD-10-CM | POA: Diagnosis not present

## 2023-01-15 DIAGNOSIS — Z992 Dependence on renal dialysis: Secondary | ICD-10-CM | POA: Diagnosis not present

## 2023-01-15 DIAGNOSIS — N2581 Secondary hyperparathyroidism of renal origin: Secondary | ICD-10-CM | POA: Diagnosis not present

## 2023-01-18 DIAGNOSIS — N2581 Secondary hyperparathyroidism of renal origin: Secondary | ICD-10-CM | POA: Diagnosis not present

## 2023-01-18 DIAGNOSIS — Z992 Dependence on renal dialysis: Secondary | ICD-10-CM | POA: Diagnosis not present

## 2023-01-18 DIAGNOSIS — N186 End stage renal disease: Secondary | ICD-10-CM | POA: Diagnosis not present

## 2023-01-20 DIAGNOSIS — N2581 Secondary hyperparathyroidism of renal origin: Secondary | ICD-10-CM | POA: Diagnosis not present

## 2023-01-20 DIAGNOSIS — N186 End stage renal disease: Secondary | ICD-10-CM | POA: Diagnosis not present

## 2023-01-20 DIAGNOSIS — Z992 Dependence on renal dialysis: Secondary | ICD-10-CM | POA: Diagnosis not present

## 2023-01-21 DIAGNOSIS — I129 Hypertensive chronic kidney disease with stage 1 through stage 4 chronic kidney disease, or unspecified chronic kidney disease: Secondary | ICD-10-CM | POA: Diagnosis not present

## 2023-01-21 DIAGNOSIS — Z992 Dependence on renal dialysis: Secondary | ICD-10-CM | POA: Diagnosis not present

## 2023-01-21 DIAGNOSIS — N186 End stage renal disease: Secondary | ICD-10-CM | POA: Diagnosis not present

## 2023-01-22 DIAGNOSIS — N2581 Secondary hyperparathyroidism of renal origin: Secondary | ICD-10-CM | POA: Diagnosis not present

## 2023-01-22 DIAGNOSIS — Z992 Dependence on renal dialysis: Secondary | ICD-10-CM | POA: Diagnosis not present

## 2023-01-22 DIAGNOSIS — N186 End stage renal disease: Secondary | ICD-10-CM | POA: Diagnosis not present

## 2023-01-25 DIAGNOSIS — N186 End stage renal disease: Secondary | ICD-10-CM | POA: Diagnosis not present

## 2023-01-25 DIAGNOSIS — Z992 Dependence on renal dialysis: Secondary | ICD-10-CM | POA: Diagnosis not present

## 2023-01-25 DIAGNOSIS — N2581 Secondary hyperparathyroidism of renal origin: Secondary | ICD-10-CM | POA: Diagnosis not present

## 2023-01-27 DIAGNOSIS — N186 End stage renal disease: Secondary | ICD-10-CM | POA: Diagnosis not present

## 2023-01-27 DIAGNOSIS — N2581 Secondary hyperparathyroidism of renal origin: Secondary | ICD-10-CM | POA: Diagnosis not present

## 2023-01-27 DIAGNOSIS — Z992 Dependence on renal dialysis: Secondary | ICD-10-CM | POA: Diagnosis not present

## 2023-01-29 DIAGNOSIS — N2581 Secondary hyperparathyroidism of renal origin: Secondary | ICD-10-CM | POA: Diagnosis not present

## 2023-01-29 DIAGNOSIS — Z992 Dependence on renal dialysis: Secondary | ICD-10-CM | POA: Diagnosis not present

## 2023-01-29 DIAGNOSIS — N186 End stage renal disease: Secondary | ICD-10-CM | POA: Diagnosis not present

## 2023-02-01 ENCOUNTER — Telehealth: Payer: Self-pay

## 2023-02-01 DIAGNOSIS — Z992 Dependence on renal dialysis: Secondary | ICD-10-CM | POA: Diagnosis not present

## 2023-02-01 DIAGNOSIS — N186 End stage renal disease: Secondary | ICD-10-CM | POA: Diagnosis not present

## 2023-02-01 DIAGNOSIS — N2581 Secondary hyperparathyroidism of renal origin: Secondary | ICD-10-CM | POA: Diagnosis not present

## 2023-02-01 NOTE — Telephone Encounter (Signed)
Transition Care Management Unsuccessful Follow-up Telephone Call  Date of discharge and from where:  [facility] 10/9  Attempts:  1st Attempt  Reason for unsuccessful TCM follow-up call:  No answer/busy   Lenard Forth [place]  [facility], [facility] Guide, Phone: [phone] Website: [email or web address]

## 2023-02-02 ENCOUNTER — Telehealth: Payer: Self-pay

## 2023-02-02 NOTE — Telephone Encounter (Signed)
Transition Care Management Unsuccessful Follow-up Telephone Call  Date of discharge and from where:  [facility] 10/9  Attempts:  2nd Attempt  Reason for unsuccessful TCM follow-up call:  No answer/busy   Lenard Forth [place]  [facility], [facility] Guide, Phone: [phone] Website: [email or web address]

## 2023-02-03 DIAGNOSIS — N2581 Secondary hyperparathyroidism of renal origin: Secondary | ICD-10-CM | POA: Diagnosis not present

## 2023-02-03 DIAGNOSIS — N186 End stage renal disease: Secondary | ICD-10-CM | POA: Diagnosis not present

## 2023-02-03 DIAGNOSIS — Z992 Dependence on renal dialysis: Secondary | ICD-10-CM | POA: Diagnosis not present

## 2023-02-05 DIAGNOSIS — N186 End stage renal disease: Secondary | ICD-10-CM | POA: Diagnosis not present

## 2023-02-05 DIAGNOSIS — N2581 Secondary hyperparathyroidism of renal origin: Secondary | ICD-10-CM | POA: Diagnosis not present

## 2023-02-05 DIAGNOSIS — Z992 Dependence on renal dialysis: Secondary | ICD-10-CM | POA: Diagnosis not present

## 2023-02-08 DIAGNOSIS — N186 End stage renal disease: Secondary | ICD-10-CM | POA: Diagnosis not present

## 2023-02-08 DIAGNOSIS — Z992 Dependence on renal dialysis: Secondary | ICD-10-CM | POA: Diagnosis not present

## 2023-02-08 DIAGNOSIS — N2581 Secondary hyperparathyroidism of renal origin: Secondary | ICD-10-CM | POA: Diagnosis not present

## 2023-02-10 DIAGNOSIS — Z992 Dependence on renal dialysis: Secondary | ICD-10-CM | POA: Diagnosis not present

## 2023-02-10 DIAGNOSIS — N186 End stage renal disease: Secondary | ICD-10-CM | POA: Diagnosis not present

## 2023-02-10 DIAGNOSIS — N2581 Secondary hyperparathyroidism of renal origin: Secondary | ICD-10-CM | POA: Diagnosis not present

## 2023-02-12 DIAGNOSIS — N2581 Secondary hyperparathyroidism of renal origin: Secondary | ICD-10-CM | POA: Diagnosis not present

## 2023-02-12 DIAGNOSIS — Z992 Dependence on renal dialysis: Secondary | ICD-10-CM | POA: Diagnosis not present

## 2023-02-12 DIAGNOSIS — N186 End stage renal disease: Secondary | ICD-10-CM | POA: Diagnosis not present

## 2023-02-14 DIAGNOSIS — Z992 Dependence on renal dialysis: Secondary | ICD-10-CM | POA: Diagnosis not present

## 2023-02-14 DIAGNOSIS — N2581 Secondary hyperparathyroidism of renal origin: Secondary | ICD-10-CM | POA: Diagnosis not present

## 2023-02-14 DIAGNOSIS — N186 End stage renal disease: Secondary | ICD-10-CM | POA: Diagnosis not present

## 2023-02-16 DIAGNOSIS — N186 End stage renal disease: Secondary | ICD-10-CM | POA: Diagnosis not present

## 2023-02-16 DIAGNOSIS — Z992 Dependence on renal dialysis: Secondary | ICD-10-CM | POA: Diagnosis not present

## 2023-02-16 DIAGNOSIS — N2581 Secondary hyperparathyroidism of renal origin: Secondary | ICD-10-CM | POA: Diagnosis not present

## 2023-02-19 DIAGNOSIS — N2581 Secondary hyperparathyroidism of renal origin: Secondary | ICD-10-CM | POA: Diagnosis not present

## 2023-02-19 DIAGNOSIS — N186 End stage renal disease: Secondary | ICD-10-CM | POA: Diagnosis not present

## 2023-02-19 DIAGNOSIS — Z992 Dependence on renal dialysis: Secondary | ICD-10-CM | POA: Diagnosis not present

## 2023-02-20 DIAGNOSIS — N186 End stage renal disease: Secondary | ICD-10-CM | POA: Diagnosis not present

## 2023-02-20 DIAGNOSIS — Z992 Dependence on renal dialysis: Secondary | ICD-10-CM | POA: Diagnosis not present

## 2023-02-20 DIAGNOSIS — I129 Hypertensive chronic kidney disease with stage 1 through stage 4 chronic kidney disease, or unspecified chronic kidney disease: Secondary | ICD-10-CM | POA: Diagnosis not present

## 2023-02-22 DIAGNOSIS — N186 End stage renal disease: Secondary | ICD-10-CM | POA: Diagnosis not present

## 2023-02-22 DIAGNOSIS — Z992 Dependence on renal dialysis: Secondary | ICD-10-CM | POA: Diagnosis not present

## 2023-02-22 DIAGNOSIS — N2581 Secondary hyperparathyroidism of renal origin: Secondary | ICD-10-CM | POA: Diagnosis not present

## 2023-02-24 DIAGNOSIS — N2581 Secondary hyperparathyroidism of renal origin: Secondary | ICD-10-CM | POA: Diagnosis not present

## 2023-02-24 DIAGNOSIS — N186 End stage renal disease: Secondary | ICD-10-CM | POA: Diagnosis not present

## 2023-02-24 DIAGNOSIS — Z992 Dependence on renal dialysis: Secondary | ICD-10-CM | POA: Diagnosis not present

## 2023-02-26 DIAGNOSIS — Z992 Dependence on renal dialysis: Secondary | ICD-10-CM | POA: Diagnosis not present

## 2023-02-26 DIAGNOSIS — N2581 Secondary hyperparathyroidism of renal origin: Secondary | ICD-10-CM | POA: Diagnosis not present

## 2023-02-26 DIAGNOSIS — N186 End stage renal disease: Secondary | ICD-10-CM | POA: Diagnosis not present

## 2023-03-01 DIAGNOSIS — Z992 Dependence on renal dialysis: Secondary | ICD-10-CM | POA: Diagnosis not present

## 2023-03-01 DIAGNOSIS — N2581 Secondary hyperparathyroidism of renal origin: Secondary | ICD-10-CM | POA: Diagnosis not present

## 2023-03-01 DIAGNOSIS — N186 End stage renal disease: Secondary | ICD-10-CM | POA: Diagnosis not present

## 2023-03-03 DIAGNOSIS — Z992 Dependence on renal dialysis: Secondary | ICD-10-CM | POA: Diagnosis not present

## 2023-03-03 DIAGNOSIS — N186 End stage renal disease: Secondary | ICD-10-CM | POA: Diagnosis not present

## 2023-03-03 DIAGNOSIS — N2581 Secondary hyperparathyroidism of renal origin: Secondary | ICD-10-CM | POA: Diagnosis not present

## 2023-03-05 ENCOUNTER — Ambulatory Visit: Payer: Medicare HMO | Admitting: Internal Medicine

## 2023-03-05 DIAGNOSIS — Z992 Dependence on renal dialysis: Secondary | ICD-10-CM | POA: Diagnosis not present

## 2023-03-05 DIAGNOSIS — N186 End stage renal disease: Secondary | ICD-10-CM | POA: Diagnosis not present

## 2023-03-05 DIAGNOSIS — N2581 Secondary hyperparathyroidism of renal origin: Secondary | ICD-10-CM | POA: Diagnosis not present

## 2023-03-08 ENCOUNTER — Other Ambulatory Visit: Payer: Self-pay | Admitting: Internal Medicine

## 2023-03-09 ENCOUNTER — Other Ambulatory Visit: Payer: Self-pay

## 2023-03-09 DIAGNOSIS — N186 End stage renal disease: Secondary | ICD-10-CM | POA: Diagnosis not present

## 2023-03-09 DIAGNOSIS — N2581 Secondary hyperparathyroidism of renal origin: Secondary | ICD-10-CM | POA: Diagnosis not present

## 2023-03-09 DIAGNOSIS — Z992 Dependence on renal dialysis: Secondary | ICD-10-CM | POA: Diagnosis not present

## 2023-03-10 DIAGNOSIS — N186 End stage renal disease: Secondary | ICD-10-CM | POA: Diagnosis not present

## 2023-03-10 DIAGNOSIS — N2581 Secondary hyperparathyroidism of renal origin: Secondary | ICD-10-CM | POA: Diagnosis not present

## 2023-03-10 DIAGNOSIS — Z992 Dependence on renal dialysis: Secondary | ICD-10-CM | POA: Diagnosis not present

## 2023-03-12 DIAGNOSIS — Z992 Dependence on renal dialysis: Secondary | ICD-10-CM | POA: Diagnosis not present

## 2023-03-12 DIAGNOSIS — N2581 Secondary hyperparathyroidism of renal origin: Secondary | ICD-10-CM | POA: Diagnosis not present

## 2023-03-12 DIAGNOSIS — N186 End stage renal disease: Secondary | ICD-10-CM | POA: Diagnosis not present

## 2023-03-14 DIAGNOSIS — Z992 Dependence on renal dialysis: Secondary | ICD-10-CM | POA: Diagnosis not present

## 2023-03-14 DIAGNOSIS — N186 End stage renal disease: Secondary | ICD-10-CM | POA: Diagnosis not present

## 2023-03-14 DIAGNOSIS — N2581 Secondary hyperparathyroidism of renal origin: Secondary | ICD-10-CM | POA: Diagnosis not present

## 2023-03-16 DIAGNOSIS — N186 End stage renal disease: Secondary | ICD-10-CM | POA: Diagnosis not present

## 2023-03-16 DIAGNOSIS — Z992 Dependence on renal dialysis: Secondary | ICD-10-CM | POA: Diagnosis not present

## 2023-03-16 DIAGNOSIS — N2581 Secondary hyperparathyroidism of renal origin: Secondary | ICD-10-CM | POA: Diagnosis not present

## 2023-03-19 DIAGNOSIS — N2581 Secondary hyperparathyroidism of renal origin: Secondary | ICD-10-CM | POA: Diagnosis not present

## 2023-03-19 DIAGNOSIS — Z992 Dependence on renal dialysis: Secondary | ICD-10-CM | POA: Diagnosis not present

## 2023-03-19 DIAGNOSIS — N186 End stage renal disease: Secondary | ICD-10-CM | POA: Diagnosis not present

## 2023-03-21 DIAGNOSIS — N2581 Secondary hyperparathyroidism of renal origin: Secondary | ICD-10-CM | POA: Diagnosis not present

## 2023-03-21 DIAGNOSIS — N186 End stage renal disease: Secondary | ICD-10-CM | POA: Diagnosis not present

## 2023-03-21 DIAGNOSIS — Z992 Dependence on renal dialysis: Secondary | ICD-10-CM | POA: Diagnosis not present

## 2023-03-23 DIAGNOSIS — Z992 Dependence on renal dialysis: Secondary | ICD-10-CM | POA: Diagnosis not present

## 2023-03-23 DIAGNOSIS — I129 Hypertensive chronic kidney disease with stage 1 through stage 4 chronic kidney disease, or unspecified chronic kidney disease: Secondary | ICD-10-CM | POA: Diagnosis not present

## 2023-03-23 DIAGNOSIS — N2581 Secondary hyperparathyroidism of renal origin: Secondary | ICD-10-CM | POA: Diagnosis not present

## 2023-03-23 DIAGNOSIS — N186 End stage renal disease: Secondary | ICD-10-CM | POA: Diagnosis not present

## 2023-03-26 DIAGNOSIS — N2581 Secondary hyperparathyroidism of renal origin: Secondary | ICD-10-CM | POA: Diagnosis not present

## 2023-03-26 DIAGNOSIS — Z992 Dependence on renal dialysis: Secondary | ICD-10-CM | POA: Diagnosis not present

## 2023-03-26 DIAGNOSIS — N186 End stage renal disease: Secondary | ICD-10-CM | POA: Diagnosis not present

## 2023-03-29 DIAGNOSIS — N186 End stage renal disease: Secondary | ICD-10-CM | POA: Diagnosis not present

## 2023-03-29 DIAGNOSIS — Z992 Dependence on renal dialysis: Secondary | ICD-10-CM | POA: Diagnosis not present

## 2023-03-29 DIAGNOSIS — N2581 Secondary hyperparathyroidism of renal origin: Secondary | ICD-10-CM | POA: Diagnosis not present

## 2023-03-31 DIAGNOSIS — Z992 Dependence on renal dialysis: Secondary | ICD-10-CM | POA: Diagnosis not present

## 2023-03-31 DIAGNOSIS — N2581 Secondary hyperparathyroidism of renal origin: Secondary | ICD-10-CM | POA: Diagnosis not present

## 2023-03-31 DIAGNOSIS — N186 End stage renal disease: Secondary | ICD-10-CM | POA: Diagnosis not present

## 2023-04-02 DIAGNOSIS — N186 End stage renal disease: Secondary | ICD-10-CM | POA: Diagnosis not present

## 2023-04-02 DIAGNOSIS — N2581 Secondary hyperparathyroidism of renal origin: Secondary | ICD-10-CM | POA: Diagnosis not present

## 2023-04-02 DIAGNOSIS — Z992 Dependence on renal dialysis: Secondary | ICD-10-CM | POA: Diagnosis not present

## 2023-04-05 DIAGNOSIS — Z992 Dependence on renal dialysis: Secondary | ICD-10-CM | POA: Diagnosis not present

## 2023-04-05 DIAGNOSIS — N2581 Secondary hyperparathyroidism of renal origin: Secondary | ICD-10-CM | POA: Diagnosis not present

## 2023-04-05 DIAGNOSIS — N186 End stage renal disease: Secondary | ICD-10-CM | POA: Diagnosis not present

## 2023-04-07 DIAGNOSIS — Z992 Dependence on renal dialysis: Secondary | ICD-10-CM | POA: Diagnosis not present

## 2023-04-07 DIAGNOSIS — N186 End stage renal disease: Secondary | ICD-10-CM | POA: Diagnosis not present

## 2023-04-07 DIAGNOSIS — N2581 Secondary hyperparathyroidism of renal origin: Secondary | ICD-10-CM | POA: Diagnosis not present

## 2023-04-09 DIAGNOSIS — N186 End stage renal disease: Secondary | ICD-10-CM | POA: Diagnosis not present

## 2023-04-09 DIAGNOSIS — Z992 Dependence on renal dialysis: Secondary | ICD-10-CM | POA: Diagnosis not present

## 2023-04-09 DIAGNOSIS — N2581 Secondary hyperparathyroidism of renal origin: Secondary | ICD-10-CM | POA: Diagnosis not present

## 2023-04-12 DIAGNOSIS — N2581 Secondary hyperparathyroidism of renal origin: Secondary | ICD-10-CM | POA: Diagnosis not present

## 2023-04-12 DIAGNOSIS — Z992 Dependence on renal dialysis: Secondary | ICD-10-CM | POA: Diagnosis not present

## 2023-04-12 DIAGNOSIS — N186 End stage renal disease: Secondary | ICD-10-CM | POA: Diagnosis not present

## 2023-04-13 ENCOUNTER — Ambulatory Visit: Payer: Medicare HMO

## 2023-04-14 DIAGNOSIS — Z992 Dependence on renal dialysis: Secondary | ICD-10-CM | POA: Diagnosis not present

## 2023-04-14 DIAGNOSIS — N2581 Secondary hyperparathyroidism of renal origin: Secondary | ICD-10-CM | POA: Diagnosis not present

## 2023-04-14 DIAGNOSIS — N186 End stage renal disease: Secondary | ICD-10-CM | POA: Diagnosis not present

## 2023-04-16 DIAGNOSIS — Z992 Dependence on renal dialysis: Secondary | ICD-10-CM | POA: Diagnosis not present

## 2023-04-16 DIAGNOSIS — N2581 Secondary hyperparathyroidism of renal origin: Secondary | ICD-10-CM | POA: Diagnosis not present

## 2023-04-16 DIAGNOSIS — N186 End stage renal disease: Secondary | ICD-10-CM | POA: Diagnosis not present

## 2023-04-19 DIAGNOSIS — Z992 Dependence on renal dialysis: Secondary | ICD-10-CM | POA: Diagnosis not present

## 2023-04-19 DIAGNOSIS — N186 End stage renal disease: Secondary | ICD-10-CM | POA: Diagnosis not present

## 2023-04-19 DIAGNOSIS — N2581 Secondary hyperparathyroidism of renal origin: Secondary | ICD-10-CM | POA: Diagnosis not present

## 2023-04-21 DIAGNOSIS — N186 End stage renal disease: Secondary | ICD-10-CM | POA: Diagnosis not present

## 2023-04-21 DIAGNOSIS — N2581 Secondary hyperparathyroidism of renal origin: Secondary | ICD-10-CM | POA: Diagnosis not present

## 2023-04-21 DIAGNOSIS — Z992 Dependence on renal dialysis: Secondary | ICD-10-CM | POA: Diagnosis not present

## 2023-04-23 DIAGNOSIS — Z992 Dependence on renal dialysis: Secondary | ICD-10-CM | POA: Diagnosis not present

## 2023-04-23 DIAGNOSIS — N186 End stage renal disease: Secondary | ICD-10-CM | POA: Diagnosis not present

## 2023-04-23 DIAGNOSIS — I129 Hypertensive chronic kidney disease with stage 1 through stage 4 chronic kidney disease, or unspecified chronic kidney disease: Secondary | ICD-10-CM | POA: Diagnosis not present

## 2023-04-23 DIAGNOSIS — N2581 Secondary hyperparathyroidism of renal origin: Secondary | ICD-10-CM | POA: Diagnosis not present

## 2023-04-26 DIAGNOSIS — Z992 Dependence on renal dialysis: Secondary | ICD-10-CM | POA: Diagnosis not present

## 2023-04-26 DIAGNOSIS — N2581 Secondary hyperparathyroidism of renal origin: Secondary | ICD-10-CM | POA: Diagnosis not present

## 2023-04-26 DIAGNOSIS — N186 End stage renal disease: Secondary | ICD-10-CM | POA: Diagnosis not present

## 2023-04-28 DIAGNOSIS — N2581 Secondary hyperparathyroidism of renal origin: Secondary | ICD-10-CM | POA: Diagnosis not present

## 2023-04-28 DIAGNOSIS — N186 End stage renal disease: Secondary | ICD-10-CM | POA: Diagnosis not present

## 2023-04-28 DIAGNOSIS — Z992 Dependence on renal dialysis: Secondary | ICD-10-CM | POA: Diagnosis not present

## 2023-04-30 ENCOUNTER — Emergency Department (HOSPITAL_COMMUNITY)
Admission: EM | Admit: 2023-04-30 | Discharge: 2023-05-01 | Disposition: A | Discharge status: Home / Self Care | Payer: Medicare HMO | Attending: Emergency Medicine | Admitting: Emergency Medicine

## 2023-04-30 ENCOUNTER — Other Ambulatory Visit: Payer: Self-pay

## 2023-04-30 ENCOUNTER — Encounter (HOSPITAL_COMMUNITY): Payer: Self-pay

## 2023-04-30 ENCOUNTER — Emergency Department (HOSPITAL_COMMUNITY): Payer: Medicare HMO

## 2023-04-30 DIAGNOSIS — I509 Heart failure, unspecified: Secondary | ICD-10-CM | POA: Diagnosis not present

## 2023-04-30 DIAGNOSIS — Z79899 Other long term (current) drug therapy: Secondary | ICD-10-CM | POA: Diagnosis not present

## 2023-04-30 DIAGNOSIS — I132 Hypertensive heart and chronic kidney disease with heart failure and with stage 5 chronic kidney disease, or end stage renal disease: Secondary | ICD-10-CM | POA: Diagnosis not present

## 2023-04-30 DIAGNOSIS — R0789 Other chest pain: Secondary | ICD-10-CM | POA: Diagnosis not present

## 2023-04-30 DIAGNOSIS — N186 End stage renal disease: Secondary | ICD-10-CM | POA: Diagnosis not present

## 2023-04-30 DIAGNOSIS — J101 Influenza due to other identified influenza virus with other respiratory manifestations: Secondary | ICD-10-CM | POA: Insufficient documentation

## 2023-04-30 DIAGNOSIS — Z992 Dependence on renal dialysis: Secondary | ICD-10-CM | POA: Diagnosis not present

## 2023-04-30 DIAGNOSIS — N2581 Secondary hyperparathyroidism of renal origin: Secondary | ICD-10-CM | POA: Diagnosis not present

## 2023-04-30 DIAGNOSIS — J111 Influenza due to unidentified influenza virus with other respiratory manifestations: Secondary | ICD-10-CM | POA: Diagnosis not present

## 2023-04-30 DIAGNOSIS — Z7982 Long term (current) use of aspirin: Secondary | ICD-10-CM | POA: Insufficient documentation

## 2023-04-30 DIAGNOSIS — Z20822 Contact with and (suspected) exposure to covid-19: Secondary | ICD-10-CM | POA: Insufficient documentation

## 2023-04-30 DIAGNOSIS — R079 Chest pain, unspecified: Secondary | ICD-10-CM | POA: Diagnosis not present

## 2023-04-30 LAB — RESP PANEL BY RT-PCR (RSV, FLU A&B, COVID)  RVPGX2
Influenza A by PCR: POSITIVE — AB
Influenza B by PCR: NEGATIVE
Resp Syncytial Virus by PCR: NEGATIVE
SARS Coronavirus 2 by RT PCR: NEGATIVE

## 2023-04-30 LAB — CBC
HCT: 37 % — ABNORMAL LOW (ref 39.0–52.0)
Hemoglobin: 11.7 g/dL — ABNORMAL LOW (ref 13.0–17.0)
MCH: 27.5 pg (ref 26.0–34.0)
MCHC: 31.6 g/dL (ref 30.0–36.0)
MCV: 87.1 fL (ref 80.0–100.0)
Platelets: 152 10*3/uL (ref 150–400)
RBC: 4.25 MIL/uL (ref 4.22–5.81)
RDW: 15.6 % — ABNORMAL HIGH (ref 11.5–15.5)
WBC: 4.7 10*3/uL (ref 4.0–10.5)
nRBC: 0 % (ref 0.0–0.2)

## 2023-04-30 LAB — BASIC METABOLIC PANEL
Anion gap: 14 (ref 5–15)
BUN: 13 mg/dL (ref 8–23)
CO2: 24 mmol/L (ref 22–32)
Calcium: 9.8 mg/dL (ref 8.9–10.3)
Chloride: 93 mmol/L — ABNORMAL LOW (ref 98–111)
Creatinine, Ser: 6.56 mg/dL — ABNORMAL HIGH (ref 0.61–1.24)
GFR, Estimated: 9 mL/min — ABNORMAL LOW (ref >60)
Glucose, Bld: 88 mg/dL (ref 70–99)
Potassium: 4.3 mmol/L (ref 3.5–5.1)
Sodium: 131 mmol/L — ABNORMAL LOW (ref 135–145)

## 2023-04-30 LAB — TROPONIN I (HIGH SENSITIVITY)
Troponin I (High Sensitivity): 21 ng/L — ABNORMAL HIGH (ref <18)
Troponin I (High Sensitivity): 27 ng/L — ABNORMAL HIGH (ref <18)

## 2023-04-30 MED ORDER — ACETAMINOPHEN 500 MG PO TABS
1000.0000 mg | ORAL_TABLET | Freq: Once | ORAL | Status: AC
  Administered 2023-04-30: 1000 mg via ORAL
  Filled 2023-04-30: qty 2

## 2023-04-30 NOTE — Discharge Instructions (Addendum)
 Evaluation was overall reassuring. Workup did reveal that you have the flu.  I suspect this is contributing to your symptoms. Treatment is supportive at home which includes rest and hydration with water  and Gatorade.  You can also take Tylenol  for fever and symptomatic relief.  If you have uncontrolled fever, lethargy, inability to tolerate fluid intake, chest pain shortness of breath or any other concerning symptom please return to the emergency department further evaluation.

## 2023-04-30 NOTE — ED Provider Notes (Addendum)
 [place] EMERGENCY DEPARTMENT AT [facility] Provider Note   CSN: [id number] Arrival date & time: 04/30/23  1713     History  Chief Complaint  Patient presents with   Chest Pain   HPI Craig Flynn is a 63 y.o. male with ESRD, CHF, hypertension, anemia presenting for chest pain.  Started last night.  He states that there was some black mold that got into his house yesterday he believes it is in his lungs.  States he uses had a cough and right-sided chest pain.  The chest pain is nonradiating otherwise and feels sharp.  Hurts with coughing but not deep breathing.  States he is also having cold sweats but denies fever at home.   Chest Pain      Home Medications Prior to Admission medications   Medication Sig Start Date End Date Taking? Authorizing Provider  acetaminophen  (TYLENOL ) 500 MG tablet Take 1,000 mg by mouth every 6 (six) hours as needed for moderate pain.    [provider]  albuterol  (VENTOLIN  HFA) 108 (90 Base) MCG/ACT inhaler INHALE 2 PUFFS INTO THE LUNGS EVERY 6 HOURS AS NEEDED FOR WHEEZING OR SHORTNESS OF BREATH Patient taking differently: Inhale 2 puffs into the lungs every 6 (six) hours as needed for wheezing or shortness of breath. 08/01/20   Norleen Agent ORN, MD  aspirin  EC 81 MG tablet Take 81 mg by mouth daily.    [provider]  carvedilol  (COREG ) 25 MG tablet TAKE 1 TABLET(25 MG) BY MOUTH TWICE DAILY 12/23/21   Norleen Agent ORN, MD  diazepam  (VALIUM ) 5 MG tablet 1 tab by mouth 45 min prior to MRI Patient not taking: Reported on 05/17/2022 05/23/21   Norleen Agent ORN, MD  fenofibrate  54 MG tablet TAKE 1 TABLET BY MOUTH EVERY DAY Patient not taking: Reported on 05/17/2022 08/07/20   Lavona Agent, MD  furosemide  (LASIX ) 80 MG tablet Take 80 mg by mouth daily. 02/02/22   [provider]  gabapentin  (NEURONTIN ) 300 MG capsule TAKE 1 CAPSULE(300 MG) BY MOUTH AT BEDTIME 03/09/23   Norleen Agent ORN, MD  ibuprofen  (ADVIL ) 800 MG tablet Take 1  tablet (800 mg total) by mouth every 8 (eight) hours as needed. Patient not taking: Reported on 05/17/2022 02/25/22   Norleen Agent ORN, MD  isosorbide  mononitrate (IMDUR ) 30 MG 24 hr tablet TAKE 2 TABLETS BY MOUTH DAILY. SCHEDULE OFFICE VISIT FOR FUTURE REFILLS 08/21/22   Norleen Agent ORN, MD  ketoconazole  (NIZORAL ) 2 % cream Apply 1 application topically daily. Patient not taking: Reported on 05/17/2022 02/11/21   Sikora, Rebecca, DPM  levothyroxine  (SYNTHROID ) 125 MCG tablet Take 1 tablet (125 mcg total) by mouth every morning. 05/07/22   Norleen Agent ORN, MD  meclizine  (ANTIVERT ) 25 MG tablet Take 1 tablet (25 mg total) by mouth 3 (three) times daily as needed for dizziness. Patient not taking: Reported on 05/17/2022 11/07/21   Norleen Agent ORN, MD  midodrine  (PROAMATINE ) 2.5 MG tablet Take 1 tablet (2.5 mg total) by mouth 3 (three) times daily with meals. Patient not taking: Reported on 05/17/2022 11/07/21   Norleen Agent ORN, MD  nitroGLYCERIN  (NITROSTAT ) 0.4 MG SL tablet DISSOLVE 1 TABLET UNDER THE TONGUE EVERY 5 MINUTES AS NEEDED FOR CHEST PAIN Patient taking differently: Place 0.4 mg under the tongue every 5 (five) minutes as needed for chest pain. 03/29/19   Jesus Elspeth Sieving, MD  pantoprazole  (PROTONIX ) 40 MG tablet Take 1 tablet (40 mg total) by mouth daily. Patient taking  differently: Take 80 mg by mouth daily. 10/21/21   Norleen Lynwood ORN, MD  thiamine (VITAMIN B-1) 50 MG tablet Take 1 tablet (50 mg total) by mouth daily. Patient not taking: Reported on 05/17/2022 03/25/20   Joshua Debby CROME, MD  tiZANidine  (ZANAFLEX ) 2 MG tablet Take 1 tablet (2 mg total) by mouth daily. Patient not taking: Reported on 05/17/2022 02/20/22   Raspet, Erin K, PA-C      Allergies    Patient has no known allergies.    Review of Systems   Review of Systems  Cardiovascular:  Positive for chest pain.    Physical Exam Updated Vital Signs BP (!) 171/80   Pulse 84   Temp 99.7 F (37.6 C) (Oral)   Resp (!) 25   Ht 6' 2 (1.88 m)    Wt 115.7 kg   SpO2 100%   BMI 32.74 kg/m  Physical Exam Vitals and nursing note reviewed.  HENT:     Head: Normocephalic and atraumatic.     Mouth/Throat:     Mouth: Mucous membranes are moist.  Eyes:     General:        Right eye: No discharge.        Left eye: No discharge.     Conjunctiva/sclera: Conjunctivae normal.  Cardiovascular:     Rate and Rhythm: Normal rate and regular rhythm.     Pulses: Normal pulses.     Heart sounds: Normal heart sounds.  Pulmonary:     Effort: Pulmonary effort is normal.     Breath sounds: Normal breath sounds. No decreased breath sounds, wheezing, rhonchi or rales.  Abdominal:     General: Abdomen is flat.     Palpations: Abdomen is soft.  Skin:    General: Skin is warm and dry.  Neurological:     General: No focal deficit present.  Psychiatric:        Mood and Affect: Mood normal.     ED Results / Procedures / Treatments   Labs (all labs ordered are listed, but only abnormal results are displayed) Labs Reviewed  RESP PANEL BY RT-PCR (RSV, FLU A&B, COVID)  RVPGX2 - Abnormal; Notable for the following components:      Result Value   Influenza A by PCR POSITIVE (*)    All other components within normal limits  BASIC METABOLIC PANEL - Abnormal; Notable for the following components:   Sodium 131 (*)    Chloride 93 (*)    Creatinine, Ser 6.56 (*)    GFR, Estimated 9 (*)    All other components within normal limits  CBC - Abnormal; Notable for the following components:   Hemoglobin 11.7 (*)    HCT 37.0 (*)    RDW 15.6 (*)    All other components within normal limits  TROPONIN I (HIGH SENSITIVITY) - Abnormal; Notable for the following components:   Troponin I (High Sensitivity) 21 (*)    All other components within normal limits  TROPONIN I (HIGH SENSITIVITY) - Abnormal; Notable for the following components:   Troponin I (High Sensitivity) 27 (*)    All other components within normal limits    EKG EKG  Interpretation Date/Time:  Friday April 30 2023 17:24:14 EST Ventricular Rate:  98 PR Interval:  199 QRS Duration:  105 QT Interval:  375 QTC Calculation: 411 R Axis:   19  Text Interpretation: Sinus rhythm Ventricular bigeminy Confirmed by Cottie Cough [phone]) on 04/30/2023 7:23:56 PM  Radiology DG Chest 2 View  Result Date: 04/30/2023 CLINICAL DATA:  Chest pain EXAM: CHEST - 2 VIEW COMPARISON:  12/30/2022 FINDINGS: Frontal and lateral views of the chest demonstrate an unremarkable cardiac silhouette. No airspace disease, effusion, or pneumothorax. Chronic eventration of the left hemidiaphragm. No acute bony abnormalities. IMPRESSION: 1. Stable chest, no acute process. Electronically Signed   By: Ozell Daring M.D.   On: 04/30/2023 18:24    Procedures Procedures    Medications Ordered in ED Medications  acetaminophen  (TYLENOL ) tablet 1,000 mg (1,000 mg Oral Given 04/30/23 1950)    ED Course/ Medical Decision Making/ A&P             HEART Score: 4                    Medical Decision Making Amount and/or Complexity of Data Reviewed Labs: ordered. Radiology: ordered.  Risk OTC drugs.   Initial Impression and Ddx 63 year old well-appearing male presenting for chest pain and URI symptoms.  Exam was unremarkable.  DDx includes ACS, viral URI, pneumonia, CHF exacerbation, other. Patient PMH that increases complexity of ED encounter:  ESRD, CHF, hypertension, anemia  Interpretation of Diagnostics - I independent reviewed and interpreted the labs as followed: positive flu, elevated troponin (21 then 27 on 2nd check), hyponatremia (131)  - I independently visualized the following imaging with scope of interpretation limited to determining acute life threatening conditions related to emergency care: CXR, which revealed no acute process  - I personally reviewed EKG which revealed normal sinus rhythm with ventricular bigeminy  Patient Reassessment and Ultimate  Disposition/Management On reassessment, patient chest pain had improved somewhat.  Considered ACS but unlikely given atypical chest pain, reassuring EKG and relatively flat troponins.  Respiratory PCR was positive for flu.  Suspect this is etiology of his symptoms.  Patient looks well, no acute distress, nontoxic and will be stable.  Advised conservative treatment at home.  Discussed return precautions.  Advised follow-up with PCP.  Discharged in good condition.  Patient management required discussion with the following services or consulting groups:  None  Complexity of Problems Addressed Acute complicated illness or Injury  Additional Data Reviewed and Analyzed Further history obtained from: Past medical history and medications listed in the EMR and Prior ED visit notes  Patient Encounter Risk Assessment None     Final Clinical Impression(s) / ED Diagnoses Final diagnoses:  Flu    Rx / DC Orders ED Discharge Orders     None         Lang Norleen POUR, PA-C 04/30/23 2256    Lang Norleen POUR, PA-C 04/30/23 2301    Cottie Donnice PARAS, MD 04/30/23 [phone]

## 2023-04-30 NOTE — ED Notes (Signed)
 Patient transported to x-ray. ?

## 2023-04-30 NOTE — ED Triage Notes (Signed)
 Patient said black mold got into his house yesterday and he thinks it got into his lungs. He said he has a hard time breathing and chest pain on the right side. Causing him to have cold sweats.

## 2023-05-03 DIAGNOSIS — N186 End stage renal disease: Secondary | ICD-10-CM | POA: Diagnosis not present

## 2023-05-03 DIAGNOSIS — N2581 Secondary hyperparathyroidism of renal origin: Secondary | ICD-10-CM | POA: Diagnosis not present

## 2023-05-03 DIAGNOSIS — Z992 Dependence on renal dialysis: Secondary | ICD-10-CM | POA: Diagnosis not present

## 2023-05-04 ENCOUNTER — Ambulatory Visit: Payer: Medicare HMO | Admitting: Internal Medicine

## 2023-05-04 ENCOUNTER — Telehealth: Payer: Self-pay

## 2023-05-04 NOTE — Transitions of Care (Post Inpatient/ED Visit) (Signed)
05/04/2023  Name: Craig Flynn MRN: [id number] DOB: 1960-04-15  Today's TOC FU Call Status: Today's TOC FU Call Status:: Successful TOC FU Call Completed TOC FU Call Complete Date: 05/04/23 Patient's Name and Date of Birth confirmed.  Transition Care Management Follow-up Telephone Call Date of Discharge: 05/03/23 Discharge Facility: [facility]) Type of Discharge: Emergency Department Reason for ED Visit: Other: (influenza) How have you been since you were released from the hospital?: Better Any questions or concerns?: No  Items Reviewed: Did you receive and understand the discharge instructions provided?: Yes Medications obtained,verified, and reconciled?: Yes (Medications Reviewed) Any new allergies since your discharge?: No Dietary orders reviewed?: Yes Do you have support at home?: No  Medications Reviewed Today: Medications Reviewed Today     Reviewed by Karena Addison, LPN (Licensed Practical Nurse) on 05/04/23 at 1154  Med List Status: <None>   Medication Order Taking? Sig Documenting Provider Last Dose Status Informant  acetaminophen (TYLENOL) 500 MG tablet [id number] No Take 1,000 mg by mouth every 6 (six) hours as needed for moderate pain. [provider] 05/16/2022 Active Self  albuterol (VENTOLIN HFA) 108 (90 Base) MCG/ACT inhaler [id number] No INHALE 2 PUFFS INTO THE LUNGS EVERY 6 HOURS AS NEEDED FOR WHEEZING OR SHORTNESS OF BREATH  Patient taking differently: Inhale 2 puffs into the lungs every 6 (six) hours as needed for wheezing or shortness of breath.   Corwin Levins, MD unknown Active Self           Med Note Cyndie Chime, Merlyn Lot May 17, 2022  3:32 AM)    aspirin EC 81 MG tablet [id number] No Take 81 mg by mouth daily. [provider] 05/16/2022 Active Self  carvedilol (COREG) 25 MG tablet [id number] No TAKE 1 TABLET(25 MG) BY MOUTH TWICE DAILY Corwin Levins, MD 05/16/2022 0730 Active Self  diazepam (VALIUM) 5 MG tablet [id number] No  1 tab by mouth 45 min prior to MRI  Patient not taking: Reported on 05/17/2022   Corwin Levins, MD Completed Course Active Self  fenofibrate 54 MG tablet [id number] No TAKE 1 TABLET BY MOUTH EVERY DAY  Patient not taking: Reported on 05/17/2022   Rollene Rotunda, MD Completed Course Active Self  furosemide (LASIX) 80 MG tablet [id number] No Take 80 mg by mouth daily. [provider] Past Week Active Self  gabapentin (NEURONTIN) 300 MG capsule [id number]  TAKE 1 CAPSULE(300 MG) BY MOUTH AT BEDTIME Corwin Levins, MD  Active   ibuprofen (ADVIL) 800 MG tablet [id number] No Take 1 tablet (800 mg total) by mouth every 8 (eight) hours as needed.  Patient not taking: Reported on 05/17/2022   Corwin Levins, MD Completed Course Active Self  isosorbide mononitrate (IMDUR) 30 MG 24 hr tablet [id number]  TAKE 2 TABLETS BY MOUTH DAILY. SCHEDULE OFFICE VISIT FOR FUTURE REFILLS Corwin Levins, MD  Active   ketoconazole (NIZORAL) 2 % cream [id number] No Apply 1 application topically daily.  Patient not taking: Reported on 05/17/2022   Louann Sjogren, DPM Completed Course Active Self  levothyroxine (SYNTHROID) 125 MCG tablet [id number] No Take 1 tablet (125 mcg total) by mouth every morning. Corwin Levins, MD 05/16/2022 Active Self  meclizine (ANTIVERT) 25 MG tablet [id number] No Take 1 tablet (25 mg total) by mouth 3 (three) times daily as needed for dizziness.  Patient not taking: Reported on 05/17/2022   Corwin Levins, MD Completed Course Active Self  midodrine (PROAMATINE) 2.5 MG tablet [id number] No  Take 1 tablet (2.5 mg total) by mouth 3 (three) times daily with meals.  Patient not taking: Reported on 05/17/2022   Corwin Levins, MD Completed Course Active Self  nitroGLYCERIN (NITROSTAT) 0.4 MG SL tablet [id number] No DISSOLVE 1 TABLET UNDER THE TONGUE EVERY 5 MINUTES AS NEEDED FOR CHEST PAIN  Patient taking differently: Place 0.4 mg under the tongue every 5 (five) minutes as needed for chest pain.   Sarita Haver, MD unknown Active Self           Med Note Cyndie Chime, [facility]   Sun May 17, 2022  3:38 AM)    pantoprazole (PROTONIX) 40 MG tablet [id number] No Take 1 tablet (40 mg total) by mouth daily.  Patient taking differently: Take 80 mg by mouth daily.   Corwin Levins, MD 05/16/2022 Active Self  thiamine (VITAMIN B-1) 50 MG tablet [id number] No Take 1 tablet (50 mg total) by mouth daily.  Patient not taking: Reported on 05/17/2022   Etta Grandchild, MD Not Taking Active Self  tiZANidine (ZANAFLEX) 2 MG tablet [id number] No Take 1 tablet (2 mg total) by mouth daily.  Patient not taking: Reported on 05/17/2022   Jeani Hawking, PA-C Completed Course Active Self            Home Care and Equipment/Supplies: Were Home Health Services Ordered?: NA Any new equipment or medical supplies ordered?: NA  Functional Questionnaire: Do you need assistance with bathing/showering or dressing?: No Do you need assistance with meal preparation?: No Do you need assistance with eating?: No Do you have difficulty maintaining continence: No Do you need assistance with getting out of bed/getting out of a chair/moving?: No Do you have difficulty managing or taking your medications?: No  Follow up appointments reviewed: PCP Follow-up appointment confirmed?: Yes Date of PCP follow-up appointment?: 05/04/23 Follow-up Provider: [facility] Follow-up appointment confirmed?: NA Do you need transportation to your follow-up appointment?: No Do you understand care options if your condition(s) worsen?: Yes-patient verbalized understanding    SIGNATURE Karena Addison, LPN [facility] Nurse Health Advisor Direct Dial [phone]

## 2023-05-05 ENCOUNTER — Emergency Department (HOSPITAL_COMMUNITY)
Admission: EM | Admit: 2023-05-05 | Discharge: 2023-05-05 | Disposition: A | Discharge status: Home / Self Care | Payer: Medicare HMO | Attending: Emergency Medicine | Admitting: Emergency Medicine

## 2023-05-05 ENCOUNTER — Other Ambulatory Visit: Payer: Self-pay | Admitting: Internal Medicine

## 2023-05-05 ENCOUNTER — Encounter (HOSPITAL_COMMUNITY): Payer: Self-pay | Admitting: *Deleted

## 2023-05-05 ENCOUNTER — Emergency Department (HOSPITAL_COMMUNITY): Payer: Medicare HMO

## 2023-05-05 ENCOUNTER — Other Ambulatory Visit: Payer: Self-pay

## 2023-05-05 DIAGNOSIS — I959 Hypotension, unspecified: Secondary | ICD-10-CM | POA: Diagnosis not present

## 2023-05-05 DIAGNOSIS — N186 End stage renal disease: Secondary | ICD-10-CM | POA: Diagnosis not present

## 2023-05-05 DIAGNOSIS — R42 Dizziness and giddiness: Secondary | ICD-10-CM | POA: Diagnosis not present

## 2023-05-05 DIAGNOSIS — R079 Chest pain, unspecified: Secondary | ICD-10-CM | POA: Insufficient documentation

## 2023-05-05 DIAGNOSIS — Z7982 Long term (current) use of aspirin: Secondary | ICD-10-CM | POA: Diagnosis not present

## 2023-05-05 DIAGNOSIS — I700 Atherosclerosis of aorta: Secondary | ICD-10-CM | POA: Diagnosis not present

## 2023-05-05 DIAGNOSIS — Z992 Dependence on renal dialysis: Secondary | ICD-10-CM | POA: Diagnosis not present

## 2023-05-05 DIAGNOSIS — N2581 Secondary hyperparathyroidism of renal origin: Secondary | ICD-10-CM | POA: Diagnosis not present

## 2023-05-05 DIAGNOSIS — R0902 Hypoxemia: Secondary | ICD-10-CM | POA: Diagnosis not present

## 2023-05-05 DIAGNOSIS — R001 Bradycardia, unspecified: Secondary | ICD-10-CM | POA: Diagnosis not present

## 2023-05-05 DIAGNOSIS — R0789 Other chest pain: Secondary | ICD-10-CM | POA: Diagnosis not present

## 2023-05-05 LAB — BASIC METABOLIC PANEL
Anion gap: 17 — ABNORMAL HIGH (ref 5–15)
BUN: 39 mg/dL — ABNORMAL HIGH (ref 8–23)
CO2: 23 mmol/L (ref 22–32)
Calcium: 9.1 mg/dL (ref 8.9–10.3)
Chloride: 92 mmol/L — ABNORMAL LOW (ref 98–111)
Creatinine, Ser: 12.08 mg/dL — ABNORMAL HIGH (ref 0.61–1.24)
GFR, Estimated: 4 mL/min — ABNORMAL LOW (ref >60)
Glucose, Bld: 89 mg/dL (ref 70–99)
Potassium: 4.4 mmol/L (ref 3.5–5.1)
Sodium: 132 mmol/L — ABNORMAL LOW (ref 135–145)

## 2023-05-05 LAB — CBC
HCT: 37.5 % — ABNORMAL LOW (ref 39.0–52.0)
Hemoglobin: 11.7 g/dL — ABNORMAL LOW (ref 13.0–17.0)
MCH: 27.3 pg (ref 26.0–34.0)
MCHC: 31.2 g/dL (ref 30.0–36.0)
MCV: 87.6 fL (ref 80.0–100.0)
Platelets: 143 10*3/uL — ABNORMAL LOW (ref 150–400)
RBC: 4.28 MIL/uL (ref 4.22–5.81)
RDW: 15.7 % — ABNORMAL HIGH (ref 11.5–15.5)
WBC: 3.5 10*3/uL — ABNORMAL LOW (ref 4.0–10.5)
nRBC: 0 % (ref 0.0–0.2)

## 2023-05-05 LAB — TROPONIN I (HIGH SENSITIVITY)
Troponin I (High Sensitivity): 69 ng/L — ABNORMAL HIGH (ref <18)
Troponin I (High Sensitivity): 56 ng/L — ABNORMAL HIGH (ref <18)

## 2023-05-05 MED ORDER — DICLOFENAC SODIUM 1 % EX GEL: 4.0000 g | Freq: Four times a day (QID) | CUTANEOUS | 0 refills | Status: DC

## 2023-05-05 MED ORDER — HYDROMORPHONE HCL 1 MG/ML IJ SOLN
0.5000 mg | Freq: Once | INTRAMUSCULAR | Status: AC
  Administered 2023-05-05: 0.5 mg via INTRAVENOUS
  Filled 2023-05-05: qty 1

## 2023-05-05 NOTE — ED Notes (Signed)
Pt provided sandwich and something to drink. Denies any other needs at this time.

## 2023-05-05 NOTE — ED Notes (Signed)
Patient transported to X-ray

## 2023-05-05 NOTE — ED Provider Notes (Signed)
[place] EMERGENCY DEPARTMENT AT [facility] Provider Note   CSN: [id number] Arrival date & time: 05/05/23  7829     History  Chief Complaint  Patient presents with   Chest Pain    Craig Flynn is a 64 y.o. male.  63 yo M with a cc of chest pain.  Going on since last night.  Patient said worse this morning.  Has been coughing.  No trauma.     Chest Pain      Home Medications Prior to Admission medications   Medication Sig Start Date End Date Taking? Authorizing Provider  diclofenac Sodium (VOLTAREN) 1 % GEL Apply 4 g topically 4 (four) times daily. 05/05/23  Yes Melene Plan, DO  acetaminophen (TYLENOL) 500 MG tablet Take 1,000 mg by mouth every 6 (six) hours as needed for moderate pain.    [provider]  albuterol (VENTOLIN HFA) 108 (90 Base) MCG/ACT inhaler INHALE 2 PUFFS INTO THE LUNGS EVERY 6 HOURS AS NEEDED FOR WHEEZING OR SHORTNESS OF BREATH Patient taking differently: Inhale 2 puffs into the lungs every 6 (six) hours as needed for wheezing or shortness of breath. 08/01/20   Corwin Levins, MD  aspirin EC 81 MG tablet Take 81 mg by mouth daily.    [provider]  carvedilol (COREG) 25 MG tablet TAKE 1 TABLET(25 MG) BY MOUTH TWICE DAILY 12/23/21   Corwin Levins, MD  diazepam (VALIUM) 5 MG tablet 1 tab by mouth 45 min prior to MRI Patient not taking: Reported on 05/17/2022 05/23/21   Corwin Levins, MD  fenofibrate 54 MG tablet TAKE 1 TABLET BY MOUTH EVERY DAY Patient not taking: Reported on 05/17/2022 08/07/20   Rollene Rotunda, MD  furosemide (LASIX) 80 MG tablet Take 80 mg by mouth daily. 02/02/22   [provider]  gabapentin (NEURONTIN) 300 MG capsule TAKE 1 CAPSULE(300 MG) BY MOUTH AT BEDTIME 03/09/23   Corwin Levins, MD  ibuprofen (ADVIL) 800 MG tablet Take 1 tablet (800 mg total) by mouth every 8 (eight) hours as needed. Patient not taking: Reported on 05/17/2022 02/25/22   Corwin Levins, MD  isosorbide mononitrate (IMDUR) 30 MG 24 hr  tablet TAKE 2 TABLETS BY MOUTH DAILY. SCHEDULE OFFICE VISIT FOR FUTURE REFILLS 08/21/22   Corwin Levins, MD  ketoconazole (NIZORAL) 2 % cream Apply 1 application topically daily. Patient not taking: Reported on 05/17/2022 02/11/21   Louann Sjogren, DPM  levothyroxine (SYNTHROID) 125 MCG tablet Take 1 tablet (125 mcg total) by mouth every morning. 05/07/22   Corwin Levins, MD  meclizine (ANTIVERT) 25 MG tablet Take 1 tablet (25 mg total) by mouth 3 (three) times daily as needed for dizziness. Patient not taking: Reported on 05/17/2022 11/07/21   Corwin Levins, MD  midodrine (PROAMATINE) 2.5 MG tablet Take 1 tablet (2.5 mg total) by mouth 3 (three) times daily with meals. Patient not taking: Reported on 05/17/2022 11/07/21   Corwin Levins, MD  nitroGLYCERIN (NITROSTAT) 0.4 MG SL tablet DISSOLVE 1 TABLET UNDER THE TONGUE EVERY 5 MINUTES AS NEEDED FOR CHEST PAIN Patient taking differently: Place 0.4 mg under the tongue every 5 (five) minutes as needed for chest pain. 03/29/19   Sarita Haver, MD  pantoprazole (PROTONIX) 40 MG tablet Take 1 tablet (40 mg total) by mouth daily. Patient taking differently: Take 80 mg by mouth daily. 10/21/21   Corwin Levins, MD  thiamine (VITAMIN B-1) 50 MG tablet Take 1 tablet (50  mg total) by mouth daily. Patient not taking: Reported on 05/17/2022 03/25/20   Etta Grandchild, MD  tiZANidine (ZANAFLEX) 2 MG tablet Take 1 tablet (2 mg total) by mouth daily. Patient not taking: Reported on 05/17/2022 02/20/22   Raspet, Noberto Retort, PA-C      Allergies    Patient has no known allergies.    Review of Systems   Review of Systems  Cardiovascular:  Positive for chest pain.    Physical Exam Updated Vital Signs BP (!) 144/72   Pulse 70   Temp 98 F (36.7 C)   Resp 19   SpO2 95%  Physical Exam Vitals and nursing note reviewed.  Constitutional:      Appearance: He is well-developed.  HENT:     Head: Normocephalic and atraumatic.  Eyes:     Pupils: Pupils are equal,  round, and reactive to light.  Neck:     Vascular: No JVD.  Cardiovascular:     Rate and Rhythm: Normal rate and regular rhythm.     Heart sounds: No murmur heard.    No friction rub. No gallop.  Pulmonary:     Effort: No respiratory distress.     Breath sounds: No wheezing.  Abdominal:     General: There is no distension.     Tenderness: There is no abdominal tenderness. There is no guarding or rebound.  Musculoskeletal:        General: Normal range of motion.     Cervical back: Normal range of motion and neck supple.  Skin:    Coloration: Skin is not pale.     Findings: No rash.  Neurological:     Mental Status: He is alert and oriented to person, place, and time.  Psychiatric:        Behavior: Behavior normal.     ED Results / Procedures / Treatments   Labs (all labs ordered are listed, but only abnormal results are displayed) Labs Reviewed  BASIC METABOLIC PANEL - Abnormal; Notable for the following components:      Result Value   Sodium 132 (*)    Chloride 92 (*)    BUN 39 (*)    Creatinine, Ser 12.08 (*)    GFR, Estimated 4 (*)    Anion gap 17 (*)    All other components within normal limits  CBC - Abnormal; Notable for the following components:   WBC 3.5 (*)    Hemoglobin 11.7 (*)    HCT 37.5 (*)    RDW 15.7 (*)    Platelets 143 (*)    All other components within normal limits  TROPONIN I (HIGH SENSITIVITY) - Abnormal; Notable for the following components:   Troponin I (High Sensitivity) 69 (*)    All other components within normal limits  TROPONIN I (HIGH SENSITIVITY) - Abnormal; Notable for the following components:   Troponin I (High Sensitivity) 56 (*)    All other components within normal limits    EKG EKG Interpretation Date/Time:  Wednesday May 05 2023 06:44:11 EST Ventricular Rate:  76 PR Interval:  176 QRS Duration:  92 QT Interval:  420 QTC Calculation: 472 R Axis:   43  Text Interpretation: Sinus rhythm with occasional Premature  ventricular complexes and Premature atrial complexes Nonspecific ST abnormality Abnormal ECG No significant change since last tracing Confirmed by Melene Plan [phone]) on 05/05/2023 7:18:33 AM  Radiology DG Chest 2 View Result Date: 05/05/2023 CLINICAL DATA:  63 year old male with history of chest pain  and dizziness. EXAM: CHEST - 2 VIEW COMPARISON:  Chest x-ray 04/30/2023. FINDINGS: Lung volumes are normal. No consolidative airspace disease. No pleural effusions. No pneumothorax. No pulmonary nodule or mass noted. Pulmonary vasculature and the cardiomediastinal silhouette are within normal limits. Atherosclerosis in the thoracic aorta. IMPRESSION: 1.  No radiographic evidence of acute cardiopulmonary disease. 2. Aortic atherosclerosis. Electronically Signed   By: Trudie Reed M.D.   On: 05/05/2023 07:01    Procedures Procedures    Medications Ordered in ED Medications  HYDROmorphone (DILAUDID) injection 0.5 mg (0.5 mg Intravenous Given 05/05/23 0815)    ED Course/ Medical Decision Making/ A&P                                 Medical Decision Making Amount and/or Complexity of Data Reviewed Labs: ordered. Radiology: ordered.  Risk Prescription drug management.   63 yo M with a cc of chest pain.  Patient has been seen earlier in the week for chest pain and diagnosed with influenza.    Didn't go to dialysis today but tells me he hasn't missed any other sessions.    CXR independently interpreted by me without focal infiltrate or ptx.   Awaiting labs.  Patient's troponin is resulted at a bit higher than last check though his renal function is also worse.  Will obtain a delta.  Second troponin is actually lower than the first.  I think this is unlikely to be an MI.  The patient was seen by the dialysis coordinator.  She was able to arrange him to get dialysis today.  Patient is feeling a bit better.  Will discharge home.  11:06 AM:  I have discussed the diagnosis/risks/treatment  options with the patient.  Evaluation and diagnostic testing in the emergency department does not suggest an emergent condition requiring admission or immediate intervention beyond what has been performed at this time.  They will follow up with PCP. We also discussed returning to the ED immediately if new or worsening sx occur. We discussed the sx which are most concerning (e.g., sudden worsening pain, fever, inability to tolerate by mouth) that necessitate immediate return. Medications administered to the patient during their visit and any new prescriptions provided to the patient are listed below.  Medications given during this visit Medications  HYDROmorphone (DILAUDID) injection 0.5 mg (0.5 mg Intravenous Given 05/05/23 0815)     The patient appears reasonably screen and/or stabilized for discharge and I doubt any other medical condition or other [facility] requiring further screening, evaluation, or treatment in the ED at this time prior to discharge.          Final Clinical Impression(s) / ED Diagnoses Final diagnoses:  Nonspecific chest pain    Rx / DC Orders ED Discharge Orders          Ordered    diclofenac Sodium (VOLTAREN) 1 % GEL  4 times daily        05/05/23 1000              Melene Plan, DO 05/05/23 1106

## 2023-05-05 NOTE — ED Notes (Signed)
Dc instructions and scripts reviewed with pt. No questions or concerns at this time. Pt advised to go to dialysis appointment at this time.

## 2023-05-05 NOTE — Discharge Instructions (Addendum)
I think your chest pain is likely from you having the flu.  Please go to dialysis today as scheduled.  Please return for worsening symptoms.  Please follow-up with your family doctor in the office.  I prescribed you a gel that you can try and rub right where it hurts.

## 2023-05-05 NOTE — ED Triage Notes (Signed)
Pt arrives via [facility] from home. PT went to bed with some chest pain, woke up at 3 am with 7/10 pain, associated with SOB and Dizziness. Dialysis pt MWF. Missed dialysis this morning, he felt too dizzy. NSR with frequent PVC's on the monitor. En route, IV established in the right AC, HR 76, 130/70, 94% ra, placed on 3 liters 99% repeat SAT. Given 324 ASA.

## 2023-05-05 NOTE — ED Provider Triage Note (Signed)
Emergency Medicine Provider Triage Evaluation Note  Craig Flynn , a 63 y.o. male  was evaluated in triage.  Pt complains of chest pain, cough congestion.  Was supposed to go to dialysis today but came here instead.  Review of Systems  Positive: Cp, cough, congestion Negative: ap  Physical Exam  BP 133/60 (BP Location: Right Arm)   Pulse 77   Temp 97.9 F (36.6 C) (Oral)   Resp 20   SpO2 92%  Gen:   Awake, no distress   Resp:  Normal effort  MSK:   Moves extremities without difficulty  Other:    Medical Decision Making  Medically screening exam initiated at 7:22 AM.  Appropriate orders placed.  Craig Flynn was informed that the remainder of the evaluation will be completed by another provider, this initial triage assessment does not replace that evaluation, and the importance of remaining in the ED until their evaluation is complete.     Craig Plan, DO 05/05/23 [phone]

## 2023-05-06 ENCOUNTER — Other Ambulatory Visit: Payer: Self-pay

## 2023-05-07 DIAGNOSIS — N186 End stage renal disease: Secondary | ICD-10-CM | POA: Diagnosis not present

## 2023-05-07 DIAGNOSIS — N2581 Secondary hyperparathyroidism of renal origin: Secondary | ICD-10-CM | POA: Diagnosis not present

## 2023-05-07 DIAGNOSIS — Z992 Dependence on renal dialysis: Secondary | ICD-10-CM | POA: Diagnosis not present

## 2023-05-10 DIAGNOSIS — N186 End stage renal disease: Secondary | ICD-10-CM | POA: Diagnosis not present

## 2023-05-10 DIAGNOSIS — N2581 Secondary hyperparathyroidism of renal origin: Secondary | ICD-10-CM | POA: Diagnosis not present

## 2023-05-10 DIAGNOSIS — Z992 Dependence on renal dialysis: Secondary | ICD-10-CM | POA: Diagnosis not present

## 2023-05-12 DIAGNOSIS — N2581 Secondary hyperparathyroidism of renal origin: Secondary | ICD-10-CM | POA: Diagnosis not present

## 2023-05-12 DIAGNOSIS — N186 End stage renal disease: Secondary | ICD-10-CM | POA: Diagnosis not present

## 2023-05-12 DIAGNOSIS — Z992 Dependence on renal dialysis: Secondary | ICD-10-CM | POA: Diagnosis not present

## 2023-05-14 DIAGNOSIS — N186 End stage renal disease: Secondary | ICD-10-CM | POA: Diagnosis not present

## 2023-05-14 DIAGNOSIS — N2581 Secondary hyperparathyroidism of renal origin: Secondary | ICD-10-CM | POA: Diagnosis not present

## 2023-05-14 DIAGNOSIS — Z992 Dependence on renal dialysis: Secondary | ICD-10-CM | POA: Diagnosis not present

## 2023-05-17 DIAGNOSIS — N186 End stage renal disease: Secondary | ICD-10-CM | POA: Diagnosis not present

## 2023-05-17 DIAGNOSIS — Z992 Dependence on renal dialysis: Secondary | ICD-10-CM | POA: Diagnosis not present

## 2023-05-17 DIAGNOSIS — N2581 Secondary hyperparathyroidism of renal origin: Secondary | ICD-10-CM | POA: Diagnosis not present

## 2023-05-19 DIAGNOSIS — N2581 Secondary hyperparathyroidism of renal origin: Secondary | ICD-10-CM | POA: Diagnosis not present

## 2023-05-19 DIAGNOSIS — N186 End stage renal disease: Secondary | ICD-10-CM | POA: Diagnosis not present

## 2023-05-19 DIAGNOSIS — Z992 Dependence on renal dialysis: Secondary | ICD-10-CM | POA: Diagnosis not present

## 2023-05-21 DIAGNOSIS — Z992 Dependence on renal dialysis: Secondary | ICD-10-CM | POA: Diagnosis not present

## 2023-05-21 DIAGNOSIS — N186 End stage renal disease: Secondary | ICD-10-CM | POA: Diagnosis not present

## 2023-05-21 DIAGNOSIS — N2581 Secondary hyperparathyroidism of renal origin: Secondary | ICD-10-CM | POA: Diagnosis not present

## 2023-05-21 DIAGNOSIS — I129 Hypertensive chronic kidney disease with stage 1 through stage 4 chronic kidney disease, or unspecified chronic kidney disease: Secondary | ICD-10-CM | POA: Diagnosis not present

## 2023-05-24 DIAGNOSIS — Z992 Dependence on renal dialysis: Secondary | ICD-10-CM | POA: Diagnosis not present

## 2023-05-24 DIAGNOSIS — N2581 Secondary hyperparathyroidism of renal origin: Secondary | ICD-10-CM | POA: Diagnosis not present

## 2023-05-24 DIAGNOSIS — N186 End stage renal disease: Secondary | ICD-10-CM | POA: Diagnosis not present

## 2023-05-26 DIAGNOSIS — Z992 Dependence on renal dialysis: Secondary | ICD-10-CM | POA: Diagnosis not present

## 2023-05-26 DIAGNOSIS — N2581 Secondary hyperparathyroidism of renal origin: Secondary | ICD-10-CM | POA: Diagnosis not present

## 2023-05-26 DIAGNOSIS — N186 End stage renal disease: Secondary | ICD-10-CM | POA: Diagnosis not present

## 2023-05-28 DIAGNOSIS — N2581 Secondary hyperparathyroidism of renal origin: Secondary | ICD-10-CM | POA: Diagnosis not present

## 2023-05-28 DIAGNOSIS — Z992 Dependence on renal dialysis: Secondary | ICD-10-CM | POA: Diagnosis not present

## 2023-05-28 DIAGNOSIS — N186 End stage renal disease: Secondary | ICD-10-CM | POA: Diagnosis not present

## 2023-05-31 DIAGNOSIS — N186 End stage renal disease: Secondary | ICD-10-CM | POA: Diagnosis not present

## 2023-05-31 DIAGNOSIS — N2581 Secondary hyperparathyroidism of renal origin: Secondary | ICD-10-CM | POA: Diagnosis not present

## 2023-05-31 DIAGNOSIS — Z992 Dependence on renal dialysis: Secondary | ICD-10-CM | POA: Diagnosis not present

## 2023-06-02 DIAGNOSIS — Z992 Dependence on renal dialysis: Secondary | ICD-10-CM | POA: Diagnosis not present

## 2023-06-02 DIAGNOSIS — N2581 Secondary hyperparathyroidism of renal origin: Secondary | ICD-10-CM | POA: Diagnosis not present

## 2023-06-02 DIAGNOSIS — N186 End stage renal disease: Secondary | ICD-10-CM | POA: Diagnosis not present

## 2023-06-04 DIAGNOSIS — N186 End stage renal disease: Secondary | ICD-10-CM | POA: Diagnosis not present

## 2023-06-04 DIAGNOSIS — Z992 Dependence on renal dialysis: Secondary | ICD-10-CM | POA: Diagnosis not present

## 2023-06-04 DIAGNOSIS — N2581 Secondary hyperparathyroidism of renal origin: Secondary | ICD-10-CM | POA: Diagnosis not present

## 2023-06-07 DIAGNOSIS — N2581 Secondary hyperparathyroidism of renal origin: Secondary | ICD-10-CM | POA: Diagnosis not present

## 2023-06-07 DIAGNOSIS — Z992 Dependence on renal dialysis: Secondary | ICD-10-CM | POA: Diagnosis not present

## 2023-06-07 DIAGNOSIS — N186 End stage renal disease: Secondary | ICD-10-CM | POA: Diagnosis not present

## 2023-06-09 DIAGNOSIS — N2581 Secondary hyperparathyroidism of renal origin: Secondary | ICD-10-CM | POA: Diagnosis not present

## 2023-06-09 DIAGNOSIS — N186 End stage renal disease: Secondary | ICD-10-CM | POA: Diagnosis not present

## 2023-06-09 DIAGNOSIS — Z992 Dependence on renal dialysis: Secondary | ICD-10-CM | POA: Diagnosis not present

## 2023-06-11 DIAGNOSIS — N186 End stage renal disease: Secondary | ICD-10-CM | POA: Diagnosis not present

## 2023-06-11 DIAGNOSIS — Z992 Dependence on renal dialysis: Secondary | ICD-10-CM | POA: Diagnosis not present

## 2023-06-11 DIAGNOSIS — N2581 Secondary hyperparathyroidism of renal origin: Secondary | ICD-10-CM | POA: Diagnosis not present

## 2023-06-14 DIAGNOSIS — N2581 Secondary hyperparathyroidism of renal origin: Secondary | ICD-10-CM | POA: Diagnosis not present

## 2023-06-14 DIAGNOSIS — N186 End stage renal disease: Secondary | ICD-10-CM | POA: Diagnosis not present

## 2023-06-14 DIAGNOSIS — Z992 Dependence on renal dialysis: Secondary | ICD-10-CM | POA: Diagnosis not present

## 2023-06-16 DIAGNOSIS — N186 End stage renal disease: Secondary | ICD-10-CM | POA: Diagnosis not present

## 2023-06-16 DIAGNOSIS — Z992 Dependence on renal dialysis: Secondary | ICD-10-CM | POA: Diagnosis not present

## 2023-06-16 DIAGNOSIS — N2581 Secondary hyperparathyroidism of renal origin: Secondary | ICD-10-CM | POA: Diagnosis not present

## 2023-06-17 ENCOUNTER — Encounter (HOSPITAL_COMMUNITY): Payer: Self-pay

## 2023-06-17 ENCOUNTER — Emergency Department (HOSPITAL_COMMUNITY)

## 2023-06-17 ENCOUNTER — Emergency Department (HOSPITAL_COMMUNITY)
Admission: EM | Admit: 2023-06-17 | Discharge: 2023-06-17 | Disposition: A | Discharge status: Home / Self Care | Attending: Emergency Medicine | Admitting: Emergency Medicine

## 2023-06-17 ENCOUNTER — Other Ambulatory Visit: Payer: Self-pay

## 2023-06-17 DIAGNOSIS — Z992 Dependence on renal dialysis: Secondary | ICD-10-CM | POA: Diagnosis not present

## 2023-06-17 DIAGNOSIS — H81399 Other peripheral vertigo, unspecified ear: Secondary | ICD-10-CM | POA: Diagnosis not present

## 2023-06-17 DIAGNOSIS — E871 Hypo-osmolality and hyponatremia: Secondary | ICD-10-CM | POA: Diagnosis not present

## 2023-06-17 DIAGNOSIS — E039 Hypothyroidism, unspecified: Secondary | ICD-10-CM | POA: Insufficient documentation

## 2023-06-17 DIAGNOSIS — R079 Chest pain, unspecified: Secondary | ICD-10-CM

## 2023-06-17 DIAGNOSIS — R0789 Other chest pain: Secondary | ICD-10-CM | POA: Insufficient documentation

## 2023-06-17 DIAGNOSIS — I132 Hypertensive heart and chronic kidney disease with heart failure and with stage 5 chronic kidney disease, or end stage renal disease: Secondary | ICD-10-CM | POA: Insufficient documentation

## 2023-06-17 DIAGNOSIS — Z7982 Long term (current) use of aspirin: Secondary | ICD-10-CM | POA: Insufficient documentation

## 2023-06-17 DIAGNOSIS — Z720 Tobacco use: Secondary | ICD-10-CM | POA: Insufficient documentation

## 2023-06-17 DIAGNOSIS — Z79899 Other long term (current) drug therapy: Secondary | ICD-10-CM | POA: Insufficient documentation

## 2023-06-17 DIAGNOSIS — N186 End stage renal disease: Secondary | ICD-10-CM | POA: Diagnosis not present

## 2023-06-17 DIAGNOSIS — I5020 Unspecified systolic (congestive) heart failure: Secondary | ICD-10-CM | POA: Diagnosis not present

## 2023-06-17 LAB — CBC
HCT: 36.9 % — ABNORMAL LOW (ref 39.0–52.0)
Hemoglobin: 11.5 g/dL — ABNORMAL LOW (ref 13.0–17.0)
MCH: 27.6 pg (ref 26.0–34.0)
MCHC: 31.2 g/dL (ref 30.0–36.0)
MCV: 88.7 fL (ref 80.0–100.0)
Platelets: 144 10*3/uL — ABNORMAL LOW (ref 150–400)
RBC: 4.16 MIL/uL — ABNORMAL LOW (ref 4.22–5.81)
RDW: 15.1 % (ref 11.5–15.5)
WBC: 4.7 10*3/uL (ref 4.0–10.5)
nRBC: 0 % (ref 0.0–0.2)

## 2023-06-17 LAB — BASIC METABOLIC PANEL WITH GFR
Anion gap: 12 (ref 5–15)
BUN: 25 mg/dL — ABNORMAL HIGH (ref 8–23)
CO2: 24 mmol/L (ref 22–32)
Calcium: 9.4 mg/dL (ref 8.9–10.3)
Chloride: 88 mmol/L — ABNORMAL LOW (ref 98–111)
Creatinine, Ser: 7.84 mg/dL — ABNORMAL HIGH (ref 0.61–1.24)
GFR, Estimated: 7 mL/min — ABNORMAL LOW (ref >60)
Glucose, Bld: 99 mg/dL (ref 70–99)
Potassium: 3.6 mmol/L (ref 3.5–5.1)
Sodium: 124 mmol/L — ABNORMAL LOW (ref 135–145)

## 2023-06-17 LAB — TROPONIN I (HIGH SENSITIVITY)
Troponin I (High Sensitivity): 15 ng/L (ref <18)
Troponin I (High Sensitivity): 13 ng/L (ref <18)

## 2023-06-17 MED ORDER — MECLIZINE HCL 25 MG PO TABS
25.0000 mg | ORAL_TABLET | Freq: Once | ORAL | Status: AC
  Administered 2023-06-17: 25 mg via ORAL
  Filled 2023-06-17: qty 1

## 2023-06-17 MED ORDER — MECLIZINE HCL 25 MG PO TABS: 25.0000 mg | ORAL_TABLET | Freq: Three times a day (TID) | ORAL | 0 refills | Status: DC | PRN

## 2023-06-17 NOTE — Discharge Instructions (Addendum)
 Thank you for letting us evaluate you today.  You are to chest pain levels are negative so I do not feel that chest pain is cardiac in etiology.  I am reassured that it can be reproduced by palpation which makes me more think that it might be musculoskeletal in nature.  Your dizziness improved with meclizine.  I will provide you with a small prescription for this until you are able to follow-up with your PCP or neurology.  Please also follow-up with cardiology regarding chest pain and PVCs at your next appointment

## 2023-06-17 NOTE — ED Provider Notes (Addendum)
 [name] EMERGENCY DEPARTMENT AT [facility] Provider Note   CSN: [id number] Arrival date & time: 06/17/23  1747     History  Chief Complaint  Patient presents with   Chest Pain    Craig Flynn is a 63 y.o. male with past medical history of HTN, hypothyroidism, systolic CHF (EF 30-86% 2023), OSA, anemia, tobacco use, migraine, ESRD on HD (MWF) presents to ED for evaluation of CP, blurred vision that started this morning following waking up. He reports CP starts on right side of chest and radiates to left side. Describes it as 5/10 sharp pain. No associated SHOB. CP is nonexertional. Took tylenol PTA with some relief to CP. Has been seen for similar symptoms of reproducible CP on 05/05/23, 12/15/22, 05/16/22 with negative workups. Last stress test was reassuring in August of 2024. Next cardiology appointment 07/03/23.  He also complains of dizziness, blurred vision and "swimmy head" that started with CP. He states that his head feels like it is throbbing but without pain. Reports this occurs when sitting still but worsens with movement, ambulation. Was seen for similar symptoms on 10/19/2021 with negative CT, MRI. He reports that this feel similar. Denies head injury, thinners.  Of note, has dialysis on MWF and went yesterday. Hasn't missed any dialysis   Chest Pain Associated symptoms: no abdominal pain, no cough, no dizziness, no fatigue, no fever, no headache, no nausea, no numbness, no palpitations, no shortness of breath, no vomiting and no weakness       Home Medications Prior to Admission medications   Medication Sig Start Date End Date Taking? Authorizing Provider  meclizine (ANTIVERT) 25 MG tablet Take 1 tablet (25 mg total) by mouth 3 (three) times daily as needed for dizziness. 06/17/23  Yes Judithann Sheen, PA  acetaminophen (TYLENOL) 500 MG tablet Take 1,000 mg by mouth every 6 (six) hours as needed for moderate pain.    [provider]  albuterol  (VENTOLIN HFA) 108 (90 Base) MCG/ACT inhaler INHALE 2 PUFFS INTO THE LUNGS EVERY 6 HOURS AS NEEDED FOR WHEEZING OR SHORTNESS OF BREATH Patient taking differently: Inhale 2 puffs into the lungs every 6 (six) hours as needed for wheezing or shortness of breath. 08/01/20   Corwin Levins, MD  aspirin EC 81 MG tablet Take 81 mg by mouth daily.    [provider]  carvedilol (COREG) 25 MG tablet TAKE 1 TABLET(25 MG) BY MOUTH TWICE DAILY 12/23/21   Corwin Levins, MD  diazepam (VALIUM) 5 MG tablet 1 tab by mouth 45 min prior to MRI Patient not taking: Reported on 05/17/2022 05/23/21   Corwin Levins, MD  diclofenac Sodium (VOLTAREN) 1 % GEL Apply 4 g topically 4 (four) times daily. 05/05/23   Melene Plan, DO  fenofibrate 54 MG tablet TAKE 1 TABLET BY MOUTH EVERY DAY Patient not taking: Reported on 05/17/2022 08/07/20   Rollene Rotunda, MD  furosemide (LASIX) 80 MG tablet Take 80 mg by mouth daily. 02/02/22   [provider]  gabapentin (NEURONTIN) 300 MG capsule TAKE 1 CAPSULE(300 MG) BY MOUTH AT BEDTIME 03/09/23   Corwin Levins, MD  ibuprofen (ADVIL) 800 MG tablet Take 1 tablet (800 mg total) by mouth every 8 (eight) hours as needed. Patient not taking: Reported on 05/17/2022 02/25/22   Corwin Levins, MD  isosorbide mononitrate (IMDUR) 30 MG 24 hr tablet TAKE 2 TABLETS BY MOUTH DAILY. SCHEDULE OFFICE VISIT FOR FUTURE REFILLS 08/21/22   Corwin Levins,  MD  ketoconazole (NIZORAL) 2 % cream Apply 1 application topically daily. Patient not taking: Reported on 05/17/2022 02/11/21   Louann Sjogren, DPM  levothyroxine (SYNTHROID) 125 MCG tablet TAKE 1 TABLET(125 MCG) BY MOUTH EVERY MORNING 05/06/23   Corwin Levins, MD  meclizine (ANTIVERT) 25 MG tablet Take 1 tablet (25 mg total) by mouth 3 (three) times daily as needed for dizziness. Patient not taking: Reported on 05/17/2022 11/07/21   Corwin Levins, MD  midodrine (PROAMATINE) 2.5 MG tablet Take 1 tablet (2.5 mg total) by mouth 3 (three) times daily with  meals. Patient not taking: Reported on 05/17/2022 11/07/21   Corwin Levins, MD  nitroGLYCERIN (NITROSTAT) 0.4 MG SL tablet DISSOLVE 1 TABLET UNDER THE TONGUE EVERY 5 MINUTES AS NEEDED FOR CHEST PAIN Patient taking differently: Place 0.4 mg under the tongue every 5 (five) minutes as needed for chest pain. 03/29/19   Sarita Haver, MD  pantoprazole (PROTONIX) 40 MG tablet Take 1 tablet (40 mg total) by mouth daily. Patient taking differently: Take 80 mg by mouth daily. 10/21/21   Corwin Levins, MD  thiamine (VITAMIN B-1) 50 MG tablet Take 1 tablet (50 mg total) by mouth daily. Patient not taking: Reported on 05/17/2022 03/25/20   Etta Grandchild, MD  tiZANidine (ZANAFLEX) 2 MG tablet Take 1 tablet (2 mg total) by mouth daily. Patient not taking: Reported on 05/17/2022 02/20/22   Raspet, Noberto Retort, PA-C      Allergies    Patient has no known allergies.    Review of Systems   Review of Systems  Constitutional:  Negative for chills, fatigue and fever.  Respiratory:  Negative for cough, chest tightness, shortness of breath and wheezing.   Cardiovascular:  Positive for chest pain. Negative for palpitations.  Gastrointestinal:  Negative for abdominal pain, constipation, diarrhea, nausea and vomiting.  Neurological:  Negative for dizziness, seizures, weakness, light-headedness, numbness and headaches.    Physical Exam Updated Vital Signs BP (!) 150/69   Pulse 65   Temp 97.8 F (36.6 C) (Oral)   Resp 13   Ht 6\' 2"  (1.88 m)   Wt 111.1 kg   SpO2 100%   BMI 31.46 kg/m  Physical Exam Vitals and nursing note reviewed.  Constitutional:      General: He is not in acute distress.    Appearance: Normal appearance.  HENT:     Head: Normocephalic and atraumatic.  Eyes:     General: Lids are normal. No visual field deficit.    Extraocular Movements:     Right eye: Normal extraocular motion and no nystagmus.     Left eye: Normal extraocular motion and no nystagmus.     Conjunctiva/sclera:  Conjunctivae normal.  Cardiovascular:     Rate and Rhythm: Normal rate.  Pulmonary:     Effort: Pulmonary effort is normal. No respiratory distress.     Breath sounds: Normal breath sounds.  Chest:     Chest wall: Tenderness present.     Comments: Reproducible CP that worsens with palpation of right and left anterior chest Musculoskeletal:     Cervical back: Full passive range of motion without pain. No rigidity.     Right lower leg: No edema.     Left lower leg: No edema.     Comments: Fistula LUE with palpable thrill  Skin:    General: Skin is warm.     Capillary Refill: Capillary refill takes less than 2 seconds.     Coloration: Skin  is not jaundiced or pale.  Neurological:     Mental Status: He is alert and oriented to person, place, and time. Mental status is at baseline.     GCS: GCS eye subscore is 4. GCS verbal subscore is 5. GCS motor subscore is 6.     Cranial Nerves: No cranial nerve deficit or facial asymmetry.     Sensory: No sensory deficit.     Motor: No weakness, tremor, seizure activity or pronator drift.     Coordination: Coordination normal. Finger-Nose-Finger Test and Heel to [facility] Test normal. Rapid alternating movements normal.     Gait: Gait normal.     Deep Tendon Reflexes: Reflexes normal.     Comments: Follows commands appropriately     ED Results / Procedures / Treatments   Labs (all labs ordered are listed, but only abnormal results are displayed) Labs Reviewed  BASIC METABOLIC PANEL WITH GFR - Abnormal; Notable for the following components:      Result Value   Sodium 124 (*)    Chloride 88 (*)    BUN 25 (*)    Creatinine, Ser 7.84 (*)    GFR, Estimated 7 (*)    All other components within normal limits  CBC - Abnormal; Notable for the following components:   RBC 4.16 (*)    Hemoglobin 11.5 (*)    HCT 36.9 (*)    Platelets 144 (*)    All other components within normal limits  TROPONIN I (HIGH SENSITIVITY)  TROPONIN I (HIGH SENSITIVITY)     EKG EKG Interpretation Date/Time:  Thursday June 17 2023 17:54:35 EDT Ventricular Rate:  65 PR Interval:  236 QRS Duration:  110 QT Interval:  433 QTC Calculation: 451 R Axis:   34  Text Interpretation: Sinus rhythm Prolonged PR interval No significant change since last tracing Confirmed by Jacalyn Lefevre [phone]) on 06/17/2023 6:12:43 PM  Radiology DG Chest 2 View Result Date: 06/17/2023 CLINICAL DATA:  Chest pain. EXAM: CHEST - 2 VIEW COMPARISON:  May 05, 2023. FINDINGS: The heart size and mediastinal contours are within normal limits. Both lungs are clear. The visualized skeletal structures are unremarkable. IMPRESSION: No active cardiopulmonary disease. Electronically Signed   By: Lupita Raider M.D.   On: 06/17/2023 18:57    Procedures Procedures    Medications Ordered in ED Medications  meclizine (ANTIVERT) tablet 25 mg (25 mg Oral Given 06/17/23 1946)    ED Course/ Medical Decision Making/ A&P                                 Medical Decision Making Amount and/or Complexity of Data Reviewed Labs: ordered. Radiology: ordered.   Patient presents to the ED for concern of CP, dizziness, this involves an extensive number of treatment options, and is a complaint that carries with it a high risk of complications and morbidity.  The differential diagnosis includes ACS, PNA, fluid overload, MSK, vertigo, CVA   Co morbidities that complicate the patient evaluation  See HPI   Additional history obtained:  Additional history obtained from Nursing and Outside Medical Records   External records from outside source obtained and reviewed including triage RN note, ED note from 05/05/2023, 12/30/2022, 12/15/2022, 05/16/2022   Lab Tests:  I Ordered, and personally interpreted labs.  The pertinent results include:   Hyponatremia of 124 (chronically low, baseline 125-132 over past year) Hgb 11.5 Creatinine 7.84 (per baseline from ESRD on  HD) Platelets 144 (baseline  143-152 over past yr)   Imaging Studies ordered:  I ordered imaging studies including CXR  I independently visualized and interpreted imaging which showed no active dx I agree with the radiologist interpretation   Cardiac Monitoring:  The patient was maintained on a cardiac monitor.  I personally viewed and interpreted the cardiac monitored which showed an underlying rhythm of: NSR with some PVS   Medicines ordered and prescription drug management:  I ordered medication including meclizine  for dizziness  Reevaluation of the patient after these medicines showed that the patient improved I have reviewed the patients home medicines and have made adjustments as needed     Problem List / ED Course:  CP Troponin negative x 2.  EKG per baseline. CXR neg I have low suspicion for ACS as chest pain is reproducible and negative cardiac workup No exertional CP , SHOB, nausea, nor diaphoresis Will have patient follow-up with cardiologist for further management as he did have some PVCs here in ED Dizziness Peripheral in nature. Worsens with ambulation, movement Notes improvement following meclizine - will provide prescription Had similar experience last year with unremarkable CT and MRI findings.  He reports this feels the same.  Notes that he has neurology follow-up Low suspicion for CVA as patient is neurologically intact with no focal deficits.  Improvement following meclizine and dizziness tends to be chronic in nature as he has been seen before for this   Reevaluation:  After the interventions noted above, I reevaluated the patient and found that they have :improved    Dispostion:  After consideration of the diagnostic results and the patients response to treatment, I feel that the patent would benefit from outpatient management.   Discussed ED workup, disposition, return to ED precautions with patient who expresses understanding agrees with plan.  All questions answered to  their satisfaction.  They are agreeable to plan.  Discharge instructions provided on paperwork  Dr. Particia Nearing individually assessed patient, reviewed ED workup and agrees to plan  Final diagnoses:  Chest pain, unspecified type  Peripheral vertigo, unspecified laterality    Rx / DC Orders ED Discharge Orders          Ordered    meclizine (ANTIVERT) 25 MG tablet  3 times daily PRN        06/17/23 2034              Judithann Sheen, PA 06/18/23 0049    Judithann Sheen, PA 06/18/23 0050    Jacalyn Lefevre, MD 06/21/23 0900

## 2023-06-17 NOTE — ED Triage Notes (Signed)
 Right sided chest pain that intermittently radiates to the jaw and to left side. Started this AM. Denies new SOB, has chronic SOB. Denies nausea. C/o dizziness and blurry vision.

## 2023-06-18 DIAGNOSIS — N186 End stage renal disease: Secondary | ICD-10-CM | POA: Diagnosis not present

## 2023-06-18 DIAGNOSIS — N2581 Secondary hyperparathyroidism of renal origin: Secondary | ICD-10-CM | POA: Diagnosis not present

## 2023-06-18 DIAGNOSIS — Z992 Dependence on renal dialysis: Secondary | ICD-10-CM | POA: Diagnosis not present

## 2023-06-21 DIAGNOSIS — I129 Hypertensive chronic kidney disease with stage 1 through stage 4 chronic kidney disease, or unspecified chronic kidney disease: Secondary | ICD-10-CM | POA: Diagnosis not present

## 2023-06-21 DIAGNOSIS — N2581 Secondary hyperparathyroidism of renal origin: Secondary | ICD-10-CM | POA: Diagnosis not present

## 2023-06-21 DIAGNOSIS — Z992 Dependence on renal dialysis: Secondary | ICD-10-CM | POA: Diagnosis not present

## 2023-06-21 DIAGNOSIS — N186 End stage renal disease: Secondary | ICD-10-CM | POA: Diagnosis not present

## 2023-06-23 DIAGNOSIS — E8770 Fluid overload, unspecified: Secondary | ICD-10-CM | POA: Diagnosis not present

## 2023-06-23 DIAGNOSIS — N2581 Secondary hyperparathyroidism of renal origin: Secondary | ICD-10-CM | POA: Diagnosis not present

## 2023-06-23 DIAGNOSIS — Z992 Dependence on renal dialysis: Secondary | ICD-10-CM | POA: Diagnosis not present

## 2023-06-23 DIAGNOSIS — N186 End stage renal disease: Secondary | ICD-10-CM | POA: Diagnosis not present

## 2023-06-25 ENCOUNTER — Encounter: Payer: Self-pay | Admitting: Nurse Practitioner

## 2023-06-25 DIAGNOSIS — N186 End stage renal disease: Secondary | ICD-10-CM | POA: Diagnosis not present

## 2023-06-25 DIAGNOSIS — Z992 Dependence on renal dialysis: Secondary | ICD-10-CM | POA: Diagnosis not present

## 2023-06-25 DIAGNOSIS — E8770 Fluid overload, unspecified: Secondary | ICD-10-CM | POA: Diagnosis not present

## 2023-06-25 DIAGNOSIS — N2581 Secondary hyperparathyroidism of renal origin: Secondary | ICD-10-CM | POA: Diagnosis not present

## 2023-06-28 DIAGNOSIS — N186 End stage renal disease: Secondary | ICD-10-CM | POA: Diagnosis not present

## 2023-06-28 DIAGNOSIS — Z992 Dependence on renal dialysis: Secondary | ICD-10-CM | POA: Diagnosis not present

## 2023-06-28 DIAGNOSIS — N2581 Secondary hyperparathyroidism of renal origin: Secondary | ICD-10-CM | POA: Diagnosis not present

## 2023-06-28 DIAGNOSIS — E8770 Fluid overload, unspecified: Secondary | ICD-10-CM | POA: Diagnosis not present

## 2023-06-30 DIAGNOSIS — Z992 Dependence on renal dialysis: Secondary | ICD-10-CM | POA: Diagnosis not present

## 2023-06-30 DIAGNOSIS — N186 End stage renal disease: Secondary | ICD-10-CM | POA: Diagnosis not present

## 2023-06-30 DIAGNOSIS — E8770 Fluid overload, unspecified: Secondary | ICD-10-CM | POA: Diagnosis not present

## 2023-06-30 DIAGNOSIS — N2581 Secondary hyperparathyroidism of renal origin: Secondary | ICD-10-CM | POA: Diagnosis not present

## 2023-07-02 DIAGNOSIS — N186 End stage renal disease: Secondary | ICD-10-CM | POA: Diagnosis not present

## 2023-07-02 DIAGNOSIS — Z992 Dependence on renal dialysis: Secondary | ICD-10-CM | POA: Diagnosis not present

## 2023-07-02 DIAGNOSIS — E8770 Fluid overload, unspecified: Secondary | ICD-10-CM | POA: Diagnosis not present

## 2023-07-02 DIAGNOSIS — N2581 Secondary hyperparathyroidism of renal origin: Secondary | ICD-10-CM | POA: Diagnosis not present

## 2023-07-05 DIAGNOSIS — Z992 Dependence on renal dialysis: Secondary | ICD-10-CM | POA: Diagnosis not present

## 2023-07-05 DIAGNOSIS — E8770 Fluid overload, unspecified: Secondary | ICD-10-CM | POA: Insufficient documentation

## 2023-07-05 DIAGNOSIS — N186 End stage renal disease: Secondary | ICD-10-CM | POA: Diagnosis not present

## 2023-07-05 DIAGNOSIS — N2581 Secondary hyperparathyroidism of renal origin: Secondary | ICD-10-CM | POA: Diagnosis not present

## 2023-07-06 ENCOUNTER — Ambulatory Visit

## 2023-07-06 VITALS — Ht 74.0 in | Wt 245.0 lb

## 2023-07-06 DIAGNOSIS — Z0000 Encounter for general adult medical examination without abnormal findings: Secondary | ICD-10-CM

## 2023-07-06 NOTE — Progress Notes (Signed)
 Subjective:   Craig Flynn is a 63 y.o. who presents for a Medicare Wellness preventive visit.  Visit Complete: Virtual I connected with  Craig Flynn on 07/06/23 by a audio enabled telemedicine application and verified that I am speaking with the correct person using two identifiers.  Patient Location: Home  Provider Location: Home Office  I discussed the limitations of evaluation and management by telemedicine. The patient expressed understanding and agreed to proceed.  Vital Signs: Because this visit was a virtual/telehealth visit, some criteria may be missing or patient reported. Any vitals not documented were not able to be obtained and vitals that have been documented are patient reported.  VideoDeclined- This patient declined Craig Flynn, academic. Therefore the visit was completed with audio only.  Persons Participating in Visit: Patient.  AWV Questionnaire: No: Patient Medicare AWV questionnaire was not completed prior to this visit.  Cardiac Risk Factors include: advanced age (>79men, >68 women);hypertension;male gender;Other (see comment), Risk factor comments: CAD, OSA, CHF     Objective:    Today's Vitals   07/06/23 1129  Weight: 245 lb (111.1 kg)  Height: 6\' 2"  (1.88 m)   Body mass index is 31.46 kg/m.     07/06/2023   11:41 AM 04/30/2023    5:22 PM 12/15/2022    8:33 PM 05/16/2022   11:06 PM 03/27/2022    4:04 PM 02/03/2022    9:53 PM 08/24/2021    5:36 AM  Advanced Directives  Does Patient Have a Medical Advance Directive? Yes No No No No No No  Type of Estate agent of Justice;Living will        Does patient want to make changes to medical advance directive? No - Patient declined        Copy of Healthcare Power of Attorney in Chart? Yes - validated most recent copy scanned in chart (See row information)        Would patient like information on creating a medical advance directive?  No - Patient declined No -  Patient declined No - Patient declined No - Patient declined  No - Patient declined    Current Medications (verified) Outpatient Encounter Medications as of 07/06/2023  Medication Sig   acetaminophen (TYLENOL) 500 MG tablet Take 1,000 mg by mouth every 6 (six) hours as needed for moderate pain.   albuterol (VENTOLIN HFA) 108 (90 Base) MCG/ACT inhaler INHALE 2 PUFFS INTO THE LUNGS EVERY 6 HOURS AS NEEDED FOR WHEEZING OR SHORTNESS OF BREATH (Patient taking differently: Inhale 2 puffs into the lungs every 6 (six) hours as needed for wheezing or shortness of breath.)   aspirin EC 81 MG tablet Take 81 mg by mouth daily.   carvedilol (COREG) 25 MG tablet TAKE 1 TABLET(25 MG) BY MOUTH TWICE DAILY   diazepam (VALIUM) 5 MG tablet 1 tab by mouth 45 min prior to MRI   fenofibrate 54 MG tablet TAKE 1 TABLET BY MOUTH EVERY DAY   furosemide (LASIX) 80 MG tablet Take 80 mg by mouth daily.   gabapentin (NEURONTIN) 300 MG capsule TAKE 1 CAPSULE(300 MG) BY MOUTH AT BEDTIME   ibuprofen (ADVIL) 800 MG tablet Take 1 tablet (800 mg total) by mouth every 8 (eight) hours as needed.   isosorbide mononitrate (IMDUR) 30 MG 24 hr tablet TAKE 2 TABLETS BY MOUTH DAILY. SCHEDULE OFFICE VISIT FOR FUTURE REFILLS   ketoconazole (NIZORAL) 2 % cream Apply 1 application topically daily.   levothyroxine (SYNTHROID) 125 MCG tablet TAKE  1 TABLET(125 MCG) BY MOUTH EVERY MORNING   midodrine (PROAMATINE) 2.5 MG tablet Take 1 tablet (2.5 mg total) by mouth 3 (three) times daily with meals.   nitroGLYCERIN (NITROSTAT) 0.4 MG SL tablet DISSOLVE 1 TABLET UNDER THE TONGUE EVERY 5 MINUTES AS NEEDED FOR CHEST PAIN (Patient taking differently: Place 0.4 mg under the tongue every 5 (five) minutes as needed for chest pain.)   pantoprazole (PROTONIX) 40 MG tablet Take 1 tablet (40 mg total) by mouth daily. (Patient taking differently: Take 80 mg by mouth daily.)   diclofenac Sodium (VOLTAREN) 1 % GEL Apply 4 g topically 4 (four) times daily.  (Patient not taking: Reported on 07/06/2023)   meclizine (ANTIVERT) 25 MG tablet Take 1 tablet (25 mg total) by mouth 3 (three) times daily as needed for dizziness. (Patient not taking: Reported on 07/06/2023)   meclizine (ANTIVERT) 25 MG tablet Take 1 tablet (25 mg total) by mouth 3 (three) times daily as needed for dizziness. (Patient not taking: Reported on 07/06/2023)   thiamine (VITAMIN B-1) 50 MG tablet Take 1 tablet (50 mg total) by mouth daily. (Patient not taking: Reported on 07/06/2023)   tiZANidine (ZANAFLEX) 2 MG tablet Take 1 tablet (2 mg total) by mouth daily. (Patient not taking: Reported on 07/06/2023)   No facility-administered encounter medications on file as of 07/06/2023.    Allergies (verified) Patient has no known allergies.   History: Past Medical History:  Diagnosis Date   Anemia 06/2015   Arthritis    knee   Cancer (HCC)    thyroid cancer   Cardiomyopathy (HCC)    a. 10/2013: EF reduced to 35-40% b. 09/2014: EF improved to 55-60%, Grade 1 DD noted.   Chest pain 06/2015   CHF (congestive heart failure) (HCC)    CKD (chronic kidney disease), stage IV [facility])    sees  Dr Eliott Nine   Dyspnea    with exerion    GERD (gastroesophageal reflux disease)    Glaucoma    Gout    Hypertension    Obesity    Pancreatitis    Sleep apnea    not wearing c-pap now, needs new slep study per pt. (01/22/2017)   Tuberculosis 1981   while the pt was in the service.   Tubular adenoma of colon 10/2015   Past Surgical History:  Procedure Laterality Date   AV FISTULA PLACEMENT Left 03/25/2016   Procedure: Creation of Left Arm ARTERIOVENOUS (AV) FISTULA;  Surgeon: Sherren Kerns, MD;  Location: [facility] OR;  Service: Vascular;  Laterality: Left;   AV FISTULA PLACEMENT Left 08/27/2020   Procedure: LEFT BASILIC VEIN ARTERIOVENOUS (AV) FISTULA CREATION;  Surgeon: Larina Earthly, MD;  Location: [facility] OR;  Service: Vascular;  Laterality: Left;   BASCILIC VEIN TRANSPOSITION Left 10/22/2020   Procedure:  LEFT SECOND STAGE BASILIC VEIN TRANSPOSITION;  Surgeon: Sherren Kerns, MD;  Location: [facility] OR;  Service: Vascular;  Laterality: Left;   COLONOSCOPY W/ BIOPSIES AND POLYPECTOMY     "no problem"   HERNIA REPAIR     IR FLUORO GUIDE CV LINE RIGHT  08/22/2020   IR US GUIDE VASC ACCESS RIGHT  08/22/2020   LAPAROSCOPIC CHOLECYSTECTOMY     LIGATION OF COMPETING BRANCHES OF ARTERIOVENOUS FISTULA Left 01/25/2018   Procedure: LIGATION OF COMPETING BRANCHES And Revision of ARTERIOVENOUS FISTULA LEFT ARM.;  Surgeon: Sherren Kerns, MD;  Location: [facility] OR;  Service: Vascular;  Laterality: Left;   THYROIDECTOMY  01/22/2017   THYROIDECTOMY Left 01/22/2017  Procedure: THYROIDECTOMY;  Surgeon: Christia Reading, MD;  Location: [facility] OR;  Service: ENT;  Laterality: Left;   THYROIDECTOMY Right 03/26/2017   Procedure: COMPLETION OF THYROIDECTOMY;  Surgeon: Christia Reading, MD;  Location: [facility] OR;  Service: ENT;  Laterality: Right;   UMBILICAL HERNIA REPAIR     WISDOM TOOTH EXTRACTION     Family History  Problem Relation Age of Onset   Diabetes Mother    Stroke Mother    Pneumonia Father        died of Pneumonia x3   Kidney disease Maternal Grandmother    Hypertension Maternal Grandmother    Healthy Maternal Grandfather    Healthy Paternal Grandmother    Healthy Paternal Grandfather    CAD Neg Hx    Colon cancer Neg Hx    Esophageal cancer Neg Hx    Pancreatic cancer Neg Hx    Prostate cancer Neg Hx    Stomach cancer Neg Hx    Rectal cancer Neg Hx    Social History   Socioeconomic History   Marital status: Divorced    Spouse name: Not on file   Number of children: 2   Years of education: 12   Highest education level: Not on file  Occupational History   Occupation: Disability    Comment: Knees   Tobacco Use   Smoking status: Former    Current packs/day: 0.00    Types: Cigarettes    Start date: 03/16/1982    Quit date: 03/16/2022    Years since quitting: 1.3   Smokeless tobacco: Never   Tobacco  comments:    1/2 pack per day (03/25/2021)    Patient stated he quit smoking on 03/16/2022  Vaping Use   Vaping status: Never Used  Substance and Sexual Activity   Alcohol use: Not Currently    Alcohol/week: 0.0 standard drinks of alcohol   Drug use: No   Sexual activity: Yes  Other Topics Concern   Not on file  Social History Narrative   Fun: Golf, basketball    Denies religious beliefs effecting health care.       Right Handed    Drinks no caffeine    Lives in a one story home    Social Drivers of Health   Financial Resource Strain: Low Risk  (07/06/2023)   Overall Financial Resource Strain (CARDIA)    Difficulty of Paying Living Expenses: Not hard at all  Food Insecurity: No Food Insecurity (07/06/2023)   Hunger Vital Sign    Worried About Running Out of Food in the Last Year: Never true    Ran Out of Food in the Last Year: Never true  Transportation Needs: No Transportation Needs (07/06/2023)   PRAPARE - Administrator, Civil Service (Medical): No    Lack of Transportation (Non-Medical): No  Physical Activity: Sufficiently Active (07/06/2023)   Exercise Vital Sign    Days of Exercise per Week: 6 days    Minutes of Exercise per Session: 30 min  Stress: No Stress Concern Present (07/06/2023)   [employer] of Occupational Health - Occupational Stress Questionnaire    Feeling of Stress : Not at all  Social Connections: Moderately Isolated (07/06/2023)   Social Connection and Isolation Panel [NHANES]    Frequency of Communication with Friends and Family: More than three times a week    Frequency of Social Gatherings with Friends and Family: Twice a week    Attends Religious Services: More than 4 times per year  Active Member of Clubs or Organizations: No    Attends Banker Meetings: Never    Marital Status: Widowed    Tobacco Counseling Counseling given: Not Answered Tobacco comments: 1/2 pack per day (03/25/2021) Patient stated he quit  smoking on 03/16/2022    Clinical Intake:  Pre-visit preparation completed: Yes  Pain : No/denies pain     BMI - recorded: 31.46 Nutritional Risks: None  Lab Results  Component Value Date   HGBA1C 5.5 06/07/2020   HGBA1C 5.3 11/29/2019   HGBA1C 6.0 (H) 06/23/2019     How often do you need to have someone help you when you read instructions, pamphlets, or other written materials from your doctor or pharmacy?: 1 - Never  Interpreter Needed?: No  Information entered by :: Rocsi Hazelbaker, RMA   Activities of Daily Living     07/06/2023   11:33 AM  In your present state of health, do you have any difficulty performing the following activities:  Hearing? 0  Vision? 0  Difficulty concentrating or making decisions? 0  Walking or climbing stairs? 0  Dressing or bathing? 0  Doing errands, shopping? 0  Preparing Food and eating ? N  Using the Toilet? N  In the past six months, have you accidently leaked urine? N  Do you have problems with loss of bowel control? N  Managing your Medications? N  Managing your Finances? N  Housekeeping or managing your Housekeeping? N    Patient Care Team: Corwin Levins, MD as PCP - General (Internal Medicine) Rollene Rotunda, MD as PCP - Cardiology (Cardiology) Camille Bal, MD as Consulting Physician (Nephrology) [place], Heather, OD as Consulting Physician [facility]) Center, [facility] Jarvis, Lesle Chris, MD as Consulting Physician (Neurology) Associates, [facility] as Consulting Physician (Podiatry)  Indicate any recent Medical Services you may have received from other than [facility] providers in the past year (date may be approximate).     Assessment:   This is a routine wellness examination for [employer].  Hearing/Vision screen Hearing Screening - Comments:: Denies hearing difficulties   Vision Screening - Comments:: Wears eyeglasses   Goals Addressed             This Visit's Progress    Client  understands the importance of follow-up with providers by attending scheduled visits   On track      Depression Screen     07/06/2023   11:44 AM 03/27/2022    4:06 PM 11/07/2021    4:10 PM 07/29/2021    9:00 AM 06/19/2021    3:36 PM 05/13/2021    2:28 PM 05/13/2021    2:12 PM  PHQ 2/9 Scores  PHQ - 2 Score 0 0  0 0 0 0  PHQ- 9 Score 0        Exception Documentation   Patient refusal        Fall Risk     07/06/2023   11:41 AM 03/27/2022    4:05 PM 11/07/2021    4:10 PM 10/14/2021   10:20 AM 07/29/2021    9:00 AM  Fall Risk   Falls in the past year? 0 0 0 0 0  Comment    continues to deny new/ recent falls x 12 months- does not use assistive devices Denies falls x 12 months; does not use assistive devices  Number falls in past yr: 0 0  0 0  Injury with Fall? 0 0  0 0  Comment  N/A- no falls reported N/A- no falls reported  Risk for fall due to : No Fall Risks No Fall Risks No Fall Risks No Fall Risks Medication side effect;Other (Comment)  Risk for fall due to: Comment     recent postural hypotension  Follow up Falls prevention discussed;Falls evaluation completed Falls prevention discussed Falls evaluation completed Falls prevention discussed Falls prevention discussed    MEDICARE RISK AT HOME:  Medicare Risk at Home Any stairs in or around the home?: Yes If so, are there any without handrails?: Yes Home free of loose throw rugs in walkways, pet beds, electrical cords, etc?: Yes Adequate lighting in your home to reduce risk of falls?: Yes Life alert?: No Use of a cane, walker or w/c?: No Grab bars in the bathroom?: No Shower chair or bench in shower?: No Elevated toilet seat or a handicapped toilet?: No  TIMED UP AND GO:  Was the test performed?  No  Cognitive Function: Declined: Patient declined cognitive screening, but was able to answer questions in an accurate and timely manner. No cognitive impairments observed.        03/27/2022    4:05 PM  6CIT Screen  What Year? 0  points  What month? 0 points  What time? 0 points  Count back from 20 0 points  Months in reverse 0 points  Repeat phrase 0 points  Total Score 0 points    Immunizations Immunization History  Administered Date(s) Administered   Influenza Whole 01/22/2008   PFIZER Comirnaty(Gray Top)Covid-19 Tri-Sucrose Vaccine 06/24/2019, 07/18/2019, 05/02/2020   PFIZER(Purple Top)SARS-COV-2 Vaccination 06/24/2019, 07/18/2019, 05/02/2020   Pneumococcal Polysaccharide-23 01/22/2008    Screening Tests Health Maintenance  Topic Date Due   DTaP/Tdap/Td (1 - Tdap) Never done   HEMOGLOBIN A1C  12/08/2020   OPHTHALMOLOGY EXAM  05/05/2022   COVID-19 Vaccine (7 - 2024-25 season) 11/22/2022   Medicare Annual Wellness (AWV)  03/28/2023   FOOT EXAM  07/06/2023   Colonoscopy  12/20/2025   Hepatitis C Screening  Completed   HIV Screening  Completed   HPV VACCINES  Aged Out   Meningococcal B Vaccine  Aged Out   Pneumococcal Vaccine 76-48 Years old  Discontinued   INFLUENZA VACCINE  Discontinued   Zoster Vaccines- Shingrix  Discontinued    Health Maintenance  Health Maintenance Due  Topic Date Due   DTaP/Tdap/Td (1 - Tdap) Never done   HEMOGLOBIN A1C  12/08/2020   OPHTHALMOLOGY EXAM  05/05/2022   COVID-19 Vaccine (7 - 2024-25 season) 11/22/2022   Medicare Annual Wellness (AWV)  03/28/2023   FOOT EXAM  07/06/2023   Health Maintenance Items Addressed: See Nurse Notes  Additional Screening:  Vision Screening: Recommended annual ophthalmology exams for early detection of glaucoma and other disorders of the eye.  Dental Screening: Recommended annual dental exams for proper oral hygiene  Community Resource Referral / Chronic Care Management: CRR required this visit?  No   CCM required this visit?  No     Plan:     I have personally reviewed and noted the following in the patient's chart:   Medical and social history Use of alcohol, tobacco or illicit drugs  Current medications and  supplements including opioid prescriptions. Patient is not currently taking opioid prescriptions. Functional ability and status Nutritional status Physical activity Advanced directives List of other physicians Hospitalizations, surgeries, and ER visits in previous 12 months Vitals Screenings to include cognitive, depression, and falls Referrals and appointments  In addition, I have reviewed and discussed with  patient certain preventive protocols, quality metrics, and best practice recommendations. A written personalized care plan for preventive services as well as general preventive health recommendations were provided to patient.     Aries Townley L Gargi Berch, CMA   07/06/2023   After Visit Summary: (MyChart) Due to this being a telephonic visit, the after visit summary with patients personalized plan was offered to patient via MyChart   Notes: Please refer to Routing Comments.

## 2023-07-06 NOTE — Patient Instructions (Signed)
 Mr. Goodall , Thank you for taking time to come for your Medicare Wellness Visit. I appreciate your ongoing commitment to your health goals. Please review the following plan we discussed and let me know if I can assist you in the future.   Referrals/Orders/Follow-Ups/Clinician Recommendations: It was nice talking with you today.  You are due for a tetanus vaccine.  Keep up the good work  This is a list of the screening recommended for you and due dates:  Health Maintenance  Topic Date Due   DTaP/Tdap/Td vaccine (1 - Tdap) Never done   Hemoglobin A1C  12/08/2020   Eye exam for diabetics  05/05/2022   COVID-19 Vaccine (7 - 2024-25 season) 11/22/2022   Medicare Annual Wellness Visit  03/28/2023   Complete foot exam   07/06/2023   Colon Cancer Screening  12/20/2025   Hepatitis C Screening  Completed   HIV Screening  Completed   HPV Vaccine  Aged Out   Meningitis B Vaccine  Aged Out   Pneumococcal Vaccination  Discontinued   Flu Shot  Discontinued   Zoster (Shingles) Vaccine  Discontinued    Advanced directives: (In Chart) A copy of your advanced directives are scanned into your chart should your provider ever need it.  Next Medicare Annual Wellness Visit scheduled for next year: Yes

## 2023-07-07 DIAGNOSIS — E8770 Fluid overload, unspecified: Secondary | ICD-10-CM | POA: Diagnosis not present

## 2023-07-07 DIAGNOSIS — N2581 Secondary hyperparathyroidism of renal origin: Secondary | ICD-10-CM | POA: Diagnosis not present

## 2023-07-07 DIAGNOSIS — N186 End stage renal disease: Secondary | ICD-10-CM | POA: Diagnosis not present

## 2023-07-07 DIAGNOSIS — Z992 Dependence on renal dialysis: Secondary | ICD-10-CM | POA: Diagnosis not present

## 2023-07-08 DIAGNOSIS — E8770 Fluid overload, unspecified: Secondary | ICD-10-CM | POA: Diagnosis not present

## 2023-07-08 DIAGNOSIS — Z992 Dependence on renal dialysis: Secondary | ICD-10-CM | POA: Diagnosis not present

## 2023-07-08 DIAGNOSIS — N186 End stage renal disease: Secondary | ICD-10-CM | POA: Diagnosis not present

## 2023-07-08 DIAGNOSIS — N2581 Secondary hyperparathyroidism of renal origin: Secondary | ICD-10-CM | POA: Diagnosis not present

## 2023-07-09 DIAGNOSIS — E8770 Fluid overload, unspecified: Secondary | ICD-10-CM | POA: Diagnosis not present

## 2023-07-09 DIAGNOSIS — Z992 Dependence on renal dialysis: Secondary | ICD-10-CM | POA: Diagnosis not present

## 2023-07-09 DIAGNOSIS — N2581 Secondary hyperparathyroidism of renal origin: Secondary | ICD-10-CM | POA: Diagnosis not present

## 2023-07-09 DIAGNOSIS — N186 End stage renal disease: Secondary | ICD-10-CM | POA: Diagnosis not present

## 2023-07-12 DIAGNOSIS — N186 End stage renal disease: Secondary | ICD-10-CM | POA: Diagnosis not present

## 2023-07-12 DIAGNOSIS — Z992 Dependence on renal dialysis: Secondary | ICD-10-CM | POA: Diagnosis not present

## 2023-07-12 DIAGNOSIS — E8770 Fluid overload, unspecified: Secondary | ICD-10-CM | POA: Diagnosis not present

## 2023-07-12 DIAGNOSIS — N2581 Secondary hyperparathyroidism of renal origin: Secondary | ICD-10-CM | POA: Diagnosis not present

## 2023-07-14 DIAGNOSIS — N2581 Secondary hyperparathyroidism of renal origin: Secondary | ICD-10-CM | POA: Diagnosis not present

## 2023-07-14 DIAGNOSIS — E8770 Fluid overload, unspecified: Secondary | ICD-10-CM | POA: Diagnosis not present

## 2023-07-14 DIAGNOSIS — Z992 Dependence on renal dialysis: Secondary | ICD-10-CM | POA: Diagnosis not present

## 2023-07-14 DIAGNOSIS — N186 End stage renal disease: Secondary | ICD-10-CM | POA: Diagnosis not present

## 2023-07-15 ENCOUNTER — Ambulatory Visit (INDEPENDENT_AMBULATORY_CARE_PROVIDER_SITE_OTHER): Admitting: Internal Medicine

## 2023-07-15 ENCOUNTER — Telehealth: Payer: Self-pay

## 2023-07-15 ENCOUNTER — Encounter: Payer: Self-pay | Admitting: Internal Medicine

## 2023-07-15 ENCOUNTER — Other Ambulatory Visit: Payer: Self-pay | Admitting: Internal Medicine

## 2023-07-15 VITALS — BP 132/70 | HR 75 | Temp 98.5°F | Ht 74.0 in | Wt 285.0 lb

## 2023-07-15 DIAGNOSIS — R739 Hyperglycemia, unspecified: Secondary | ICD-10-CM | POA: Diagnosis not present

## 2023-07-15 DIAGNOSIS — E538 Deficiency of other specified B group vitamins: Secondary | ICD-10-CM | POA: Diagnosis not present

## 2023-07-15 DIAGNOSIS — Z0000 Encounter for general adult medical examination without abnormal findings: Secondary | ICD-10-CM

## 2023-07-15 DIAGNOSIS — Z125 Encounter for screening for malignant neoplasm of prostate: Secondary | ICD-10-CM

## 2023-07-15 DIAGNOSIS — E559 Vitamin D deficiency, unspecified: Secondary | ICD-10-CM | POA: Diagnosis not present

## 2023-07-15 DIAGNOSIS — Z0001 Encounter for general adult medical examination with abnormal findings: Secondary | ICD-10-CM

## 2023-07-15 DIAGNOSIS — N186 End stage renal disease: Secondary | ICD-10-CM | POA: Diagnosis not present

## 2023-07-15 DIAGNOSIS — I10 Essential (primary) hypertension: Secondary | ICD-10-CM

## 2023-07-15 DIAGNOSIS — I5022 Chronic systolic (congestive) heart failure: Secondary | ICD-10-CM

## 2023-07-15 LAB — CBC WITH DIFFERENTIAL/PLATELET
Basophils Absolute: 0 10*3/uL (ref 0.0–0.1)
Basophils Relative: 0.5 % (ref 0.0–3.0)
Eosinophils Absolute: 0.2 10*3/uL (ref 0.0–0.7)
Eosinophils Relative: 4.1 % (ref 0.0–5.0)
HCT: 34.7 % — ABNORMAL LOW (ref 39.0–52.0)
Hemoglobin: 11.4 g/dL — ABNORMAL LOW (ref 13.0–17.0)
Lymphocytes Relative: 34.4 % (ref 12.0–46.0)
Lymphs Abs: 1.8 10*3/uL (ref 0.7–4.0)
MCHC: 33 g/dL (ref 30.0–36.0)
MCV: 86.6 fl (ref 78.0–100.0)
Monocytes Absolute: 0.5 10*3/uL (ref 0.1–1.0)
Monocytes Relative: 9.4 % (ref 3.0–12.0)
Neutro Abs: 2.6 10*3/uL (ref 1.4–7.7)
Neutrophils Relative %: 51.6 % (ref 43.0–77.0)
Platelets: 159 10*3/uL (ref 150.0–400.0)
RBC: 4.01 Mil/uL — ABNORMAL LOW (ref 4.22–5.81)
RDW: 15.9 % — ABNORMAL HIGH (ref 11.5–15.5)
WBC: 5.1 10*3/uL (ref 4.0–10.5)

## 2023-07-15 LAB — LIPID PANEL
Cholesterol: 161 mg/dL (ref 0–200)
HDL: 36.3 mg/dL — ABNORMAL LOW (ref >39.00)
LDL Cholesterol: 106 mg/dL — ABNORMAL HIGH (ref 0–99)
NonHDL: 124.6
Total CHOL/HDL Ratio: 4
Triglycerides: 93 mg/dL (ref 0.0–149.0)
VLDL: 18.6 mg/dL (ref 0.0–40.0)

## 2023-07-15 LAB — HEPATIC FUNCTION PANEL
ALT: 7 U/L (ref 0–53)
AST: 10 U/L (ref 0–37)
Albumin: 4.2 g/dL (ref 3.5–5.2)
Alkaline Phosphatase: 63 U/L (ref 39–117)
Bilirubin, Direct: 0.1 mg/dL (ref 0.0–0.3)
Total Bilirubin: 0.5 mg/dL (ref 0.2–1.2)
Total Protein: 6.2 g/dL (ref 6.0–8.3)

## 2023-07-15 LAB — BASIC METABOLIC PANEL WITH GFR
BUN: 21 mg/dL (ref 6–23)
CO2: 29 meq/L (ref 19–32)
Calcium: 9.6 mg/dL (ref 8.4–10.5)
Chloride: 86 meq/L — ABNORMAL LOW (ref 96–112)
Creatinine, Ser: 7.22 mg/dL (ref 0.40–1.50)
GFR: 7.52 mL/min — CL (ref >60.00)
Glucose, Bld: 79 mg/dL (ref 70–99)
Potassium: 4.1 meq/L (ref 3.5–5.1)
Sodium: 126 meq/L — ABNORMAL LOW (ref 135–145)

## 2023-07-15 LAB — HEMOGLOBIN A1C: Hgb A1c MFr Bld: 5.5 % (ref 4.6–6.5)

## 2023-07-15 LAB — VITAMIN D 25 HYDROXY (VIT D DEFICIENCY, FRACTURES): VITD: 16.09 ng/mL — ABNORMAL LOW (ref 30.00–100.00)

## 2023-07-15 LAB — TSH: TSH: 1.64 u[IU]/mL (ref 0.35–5.50)

## 2023-07-15 LAB — PSA: PSA: 0.46 ng/mL (ref 0.10–4.00)

## 2023-07-15 LAB — VITAMIN B12: Vitamin B-12: 209 pg/mL — ABNORMAL LOW (ref 211–911)

## 2023-07-15 MED ORDER — ROSUVASTATIN CALCIUM 10 MG PO TABS: 10.0000 mg | ORAL_TABLET | Freq: Every day | ORAL | 3 refills | Status: DC

## 2023-07-15 NOTE — Patient Instructions (Signed)
 Please continue all other medications as before, and refills have been done if requested.  Please have the pharmacy call with any other refills you may need.  Please continue your efforts at being more active, low cholesterol diet, and weight control.  You are otherwise up to date with prevention measures today.  Please keep your appointments with your specialists as you may have planned  Please go to the LAB at the blood drawing area for the tests to be done  You will be contacted by phone if any changes need to be made immediately.  Otherwise, you will receive a letter about your results with an explanation, but please check with MyChart first.  Please make an Appointment to return for your 1 year visit, or sooner if needed
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
## 2023-07-16 DIAGNOSIS — Z992 Dependence on renal dialysis: Secondary | ICD-10-CM | POA: Diagnosis not present

## 2023-07-16 DIAGNOSIS — E8770 Fluid overload, unspecified: Secondary | ICD-10-CM | POA: Diagnosis not present

## 2023-07-16 DIAGNOSIS — N186 End stage renal disease: Secondary | ICD-10-CM | POA: Diagnosis not present

## 2023-07-16 DIAGNOSIS — N2581 Secondary hyperparathyroidism of renal origin: Secondary | ICD-10-CM | POA: Diagnosis not present

## 2023-07-17 ENCOUNTER — Encounter: Payer: Self-pay | Admitting: Internal Medicine

## 2023-07-17 NOTE — Assessment & Plan Note (Signed)
Stable volume, cont current med tx 

## 2023-07-17 NOTE — Assessment & Plan Note (Signed)
Age and sex appropriate education and counseling updated with regular exercise and diet Referrals for preventative services - none needed Immunizations addressed - declines tdap Smoking counseling  - none needed Evidence for depression or other mood disorder - none significant Most recent labs reviewed. I have personally reviewed and have noted: 1) the patient's medical and social history 2) The patient's current medications and supplements 3) The patient's height, weight, and BMI have been recorded in the chart  

## 2023-07-17 NOTE — Assessment & Plan Note (Signed)
 Pt to continue HD on m - w - f

## 2023-07-17 NOTE — Assessment & Plan Note (Signed)
 Last vitamin D  Lab Results  Component Value Date   VD25OH 16.09 (L) 07/15/2023   Low, consider start oral replacement

## 2023-07-17 NOTE — Assessment & Plan Note (Signed)
 BP Readings from Last 3 Encounters:  07/15/23 132/70  06/17/23 (!) 150/69  05/05/23 (!) 144/72   Stable, pt to continue medical treatment coreg  25 bid

## 2023-07-17 NOTE — Assessment & Plan Note (Signed)
 Lab Results  Component Value Date   HGBA1C 5.5 07/15/2023   Stable, pt to continue current medical treatment  - diet, wt control

## 2023-07-17 NOTE — Assessment & Plan Note (Signed)
 Lab Results  Component Value Date   VITAMINB12 209 (L) 07/15/2023   Low, to start oral replacement - b12 1000 mcg qd

## 2023-07-19 DIAGNOSIS — Z992 Dependence on renal dialysis: Secondary | ICD-10-CM | POA: Diagnosis not present

## 2023-07-19 DIAGNOSIS — E8770 Fluid overload, unspecified: Secondary | ICD-10-CM | POA: Diagnosis not present

## 2023-07-19 DIAGNOSIS — N186 End stage renal disease: Secondary | ICD-10-CM | POA: Diagnosis not present

## 2023-07-19 DIAGNOSIS — N2581 Secondary hyperparathyroidism of renal origin: Secondary | ICD-10-CM | POA: Diagnosis not present

## 2023-07-21 DIAGNOSIS — N186 End stage renal disease: Secondary | ICD-10-CM | POA: Diagnosis not present

## 2023-07-21 DIAGNOSIS — E8770 Fluid overload, unspecified: Secondary | ICD-10-CM | POA: Diagnosis not present

## 2023-07-21 DIAGNOSIS — N2581 Secondary hyperparathyroidism of renal origin: Secondary | ICD-10-CM | POA: Diagnosis not present

## 2023-07-21 DIAGNOSIS — I129 Hypertensive chronic kidney disease with stage 1 through stage 4 chronic kidney disease, or unspecified chronic kidney disease: Secondary | ICD-10-CM | POA: Diagnosis not present

## 2023-07-21 DIAGNOSIS — Z992 Dependence on renal dialysis: Secondary | ICD-10-CM | POA: Diagnosis not present

## 2023-07-23 DIAGNOSIS — N186 End stage renal disease: Secondary | ICD-10-CM | POA: Diagnosis not present

## 2023-07-23 DIAGNOSIS — Z992 Dependence on renal dialysis: Secondary | ICD-10-CM | POA: Diagnosis not present

## 2023-07-23 DIAGNOSIS — N2581 Secondary hyperparathyroidism of renal origin: Secondary | ICD-10-CM | POA: Diagnosis not present

## 2023-07-26 DIAGNOSIS — Z992 Dependence on renal dialysis: Secondary | ICD-10-CM | POA: Diagnosis not present

## 2023-07-26 DIAGNOSIS — N2581 Secondary hyperparathyroidism of renal origin: Secondary | ICD-10-CM | POA: Diagnosis not present

## 2023-07-26 DIAGNOSIS — N186 End stage renal disease: Secondary | ICD-10-CM | POA: Diagnosis not present

## 2023-07-27 DIAGNOSIS — R197 Diarrhea, unspecified: Secondary | ICD-10-CM | POA: Insufficient documentation

## 2023-07-28 DIAGNOSIS — Z992 Dependence on renal dialysis: Secondary | ICD-10-CM | POA: Diagnosis not present

## 2023-07-28 DIAGNOSIS — N2581 Secondary hyperparathyroidism of renal origin: Secondary | ICD-10-CM | POA: Diagnosis not present

## 2023-07-28 DIAGNOSIS — N186 End stage renal disease: Secondary | ICD-10-CM | POA: Diagnosis not present

## 2023-07-30 DIAGNOSIS — N2581 Secondary hyperparathyroidism of renal origin: Secondary | ICD-10-CM | POA: Diagnosis not present

## 2023-07-30 DIAGNOSIS — N186 End stage renal disease: Secondary | ICD-10-CM | POA: Diagnosis not present

## 2023-07-30 DIAGNOSIS — Z992 Dependence on renal dialysis: Secondary | ICD-10-CM | POA: Diagnosis not present

## 2023-08-02 DIAGNOSIS — N186 End stage renal disease: Secondary | ICD-10-CM | POA: Diagnosis not present

## 2023-08-02 DIAGNOSIS — N2581 Secondary hyperparathyroidism of renal origin: Secondary | ICD-10-CM | POA: Diagnosis not present

## 2023-08-02 DIAGNOSIS — Z992 Dependence on renal dialysis: Secondary | ICD-10-CM | POA: Diagnosis not present

## 2023-08-04 DIAGNOSIS — Z992 Dependence on renal dialysis: Secondary | ICD-10-CM | POA: Diagnosis not present

## 2023-08-04 DIAGNOSIS — N2581 Secondary hyperparathyroidism of renal origin: Secondary | ICD-10-CM | POA: Diagnosis not present

## 2023-08-04 DIAGNOSIS — N186 End stage renal disease: Secondary | ICD-10-CM | POA: Diagnosis not present

## 2023-08-06 DIAGNOSIS — N186 End stage renal disease: Secondary | ICD-10-CM | POA: Diagnosis not present

## 2023-08-06 DIAGNOSIS — N2581 Secondary hyperparathyroidism of renal origin: Secondary | ICD-10-CM | POA: Diagnosis not present

## 2023-08-06 DIAGNOSIS — Z992 Dependence on renal dialysis: Secondary | ICD-10-CM | POA: Diagnosis not present

## 2023-08-09 DIAGNOSIS — N2581 Secondary hyperparathyroidism of renal origin: Secondary | ICD-10-CM | POA: Diagnosis not present

## 2023-08-09 DIAGNOSIS — N186 End stage renal disease: Secondary | ICD-10-CM | POA: Diagnosis not present

## 2023-08-09 DIAGNOSIS — Z992 Dependence on renal dialysis: Secondary | ICD-10-CM | POA: Diagnosis not present

## 2023-08-11 DIAGNOSIS — Z992 Dependence on renal dialysis: Secondary | ICD-10-CM | POA: Diagnosis not present

## 2023-08-11 DIAGNOSIS — N186 End stage renal disease: Secondary | ICD-10-CM | POA: Diagnosis not present

## 2023-08-11 DIAGNOSIS — N2581 Secondary hyperparathyroidism of renal origin: Secondary | ICD-10-CM | POA: Diagnosis not present

## 2023-08-13 DIAGNOSIS — Z992 Dependence on renal dialysis: Secondary | ICD-10-CM | POA: Diagnosis not present

## 2023-08-13 DIAGNOSIS — N186 End stage renal disease: Secondary | ICD-10-CM | POA: Diagnosis not present

## 2023-08-13 DIAGNOSIS — N2581 Secondary hyperparathyroidism of renal origin: Secondary | ICD-10-CM | POA: Diagnosis not present

## 2023-08-16 DIAGNOSIS — N2581 Secondary hyperparathyroidism of renal origin: Secondary | ICD-10-CM | POA: Diagnosis not present

## 2023-08-16 DIAGNOSIS — Z992 Dependence on renal dialysis: Secondary | ICD-10-CM | POA: Diagnosis not present

## 2023-08-16 DIAGNOSIS — N186 End stage renal disease: Secondary | ICD-10-CM | POA: Diagnosis not present

## 2023-08-18 DIAGNOSIS — N2581 Secondary hyperparathyroidism of renal origin: Secondary | ICD-10-CM | POA: Diagnosis not present

## 2023-08-18 DIAGNOSIS — N186 End stage renal disease: Secondary | ICD-10-CM | POA: Diagnosis not present

## 2023-08-18 DIAGNOSIS — Z992 Dependence on renal dialysis: Secondary | ICD-10-CM | POA: Diagnosis not present

## 2023-08-20 DIAGNOSIS — Z992 Dependence on renal dialysis: Secondary | ICD-10-CM | POA: Diagnosis not present

## 2023-08-20 DIAGNOSIS — N186 End stage renal disease: Secondary | ICD-10-CM | POA: Diagnosis not present

## 2023-08-20 DIAGNOSIS — N2581 Secondary hyperparathyroidism of renal origin: Secondary | ICD-10-CM | POA: Diagnosis not present

## 2023-08-21 DIAGNOSIS — N186 End stage renal disease: Secondary | ICD-10-CM | POA: Diagnosis not present

## 2023-08-21 DIAGNOSIS — Z992 Dependence on renal dialysis: Secondary | ICD-10-CM | POA: Diagnosis not present

## 2023-08-21 DIAGNOSIS — I129 Hypertensive chronic kidney disease with stage 1 through stage 4 chronic kidney disease, or unspecified chronic kidney disease: Secondary | ICD-10-CM | POA: Diagnosis not present

## 2023-08-23 DIAGNOSIS — Z992 Dependence on renal dialysis: Secondary | ICD-10-CM | POA: Diagnosis not present

## 2023-08-23 DIAGNOSIS — N2581 Secondary hyperparathyroidism of renal origin: Secondary | ICD-10-CM | POA: Diagnosis not present

## 2023-08-23 DIAGNOSIS — N186 End stage renal disease: Secondary | ICD-10-CM | POA: Diagnosis not present

## 2023-08-23 DIAGNOSIS — E8770 Fluid overload, unspecified: Secondary | ICD-10-CM | POA: Diagnosis not present

## 2023-08-25 DIAGNOSIS — E8770 Fluid overload, unspecified: Secondary | ICD-10-CM | POA: Diagnosis not present

## 2023-08-25 DIAGNOSIS — N186 End stage renal disease: Secondary | ICD-10-CM | POA: Diagnosis not present

## 2023-08-25 DIAGNOSIS — N2581 Secondary hyperparathyroidism of renal origin: Secondary | ICD-10-CM | POA: Diagnosis not present

## 2023-08-25 DIAGNOSIS — Z992 Dependence on renal dialysis: Secondary | ICD-10-CM | POA: Diagnosis not present

## 2023-08-26 ENCOUNTER — Ambulatory Visit: Admitting: Nurse Practitioner

## 2023-08-26 DIAGNOSIS — Z992 Dependence on renal dialysis: Secondary | ICD-10-CM | POA: Diagnosis not present

## 2023-08-26 DIAGNOSIS — N186 End stage renal disease: Secondary | ICD-10-CM | POA: Diagnosis not present

## 2023-08-26 DIAGNOSIS — E8770 Fluid overload, unspecified: Secondary | ICD-10-CM | POA: Diagnosis not present

## 2023-08-26 DIAGNOSIS — N2581 Secondary hyperparathyroidism of renal origin: Secondary | ICD-10-CM | POA: Diagnosis not present

## 2023-08-27 DIAGNOSIS — N2581 Secondary hyperparathyroidism of renal origin: Secondary | ICD-10-CM | POA: Diagnosis not present

## 2023-08-27 DIAGNOSIS — N186 End stage renal disease: Secondary | ICD-10-CM | POA: Diagnosis not present

## 2023-08-27 DIAGNOSIS — Z992 Dependence on renal dialysis: Secondary | ICD-10-CM | POA: Diagnosis not present

## 2023-08-27 DIAGNOSIS — E8770 Fluid overload, unspecified: Secondary | ICD-10-CM | POA: Diagnosis not present

## 2023-08-30 DIAGNOSIS — N2581 Secondary hyperparathyroidism of renal origin: Secondary | ICD-10-CM | POA: Diagnosis not present

## 2023-08-30 DIAGNOSIS — N186 End stage renal disease: Secondary | ICD-10-CM | POA: Diagnosis not present

## 2023-08-30 DIAGNOSIS — E8770 Fluid overload, unspecified: Secondary | ICD-10-CM | POA: Diagnosis not present

## 2023-08-30 DIAGNOSIS — Z992 Dependence on renal dialysis: Secondary | ICD-10-CM | POA: Diagnosis not present

## 2023-08-31 NOTE — Progress Notes (Unsigned)
 Cardiology Office Note:   Date:  09/01/2023  ID:  Craig Flynn, DOB 1960-09-16, MRN [id number] PCP: Roslyn Coombe, MD   HeartCare Providers Cardiologist:  Eilleen Grates, MD {  History of Present Illness:   Craig Flynn is a 63 y.o. male  who presents for evaluation of cardiomyopathy and hypertension. The patient has long-standing hypertension. He has had a mildly reduced ejection fraction of about 45%. In July 2016 his EF was said to be about 55%.    Perfusion  study in 2017 demonstrated EF of 47% with a fixed inferior wall defect.   I sent him for a POET (Plain Old Exercise Treadmill) in 2021 which was negative but he had a suboptimal heart rate response.    Echo in April of 2021 demonstrated an EF of 60 to 65%.  He was in the emergency room in March 2025 with chest pain.  I reviewed these records for this visit.   for chest pain.  I reviewed these records.    He said that he has been having chest discomfort for about 4 to 5 months.  It happens sporadically.  He can do some walking with his son for exercise and not bring on this discomfort.  However, the discomfort he has happens at rest.  It is sharp.  It is 10 out of 10.  It might last for 30 to 40 seconds.  He gets lightheaded with it.  He does not have nausea vomiting or diaphoresis.  It is midsternal and does not radiate to his jaw or to his arms.  He has not had any associated shortness of breath, PND or orthopnea.  He has lost about 25 pounds after gaining weight to 285.  He is back down to 260.  He goes to dialysis and tolerates this without problems.  ROS: As stated in the HPI and negative for all other systems.  Studies Reviewed:    EKG:     Sinus rhythm, rate 65, axis within normal limits, intervals within normal limits, no acute ST-T wave changes.  06/17/2023.    Risk Assessment/Calculations:     Physical Exam:   VS:  BP (!) 190/75 (BP Location: Right Arm, Patient Position: Sitting, Cuff Size: Large)   Pulse 92    Ht 6' 2 (1.88 m)   Wt 260 lb (117.9 kg)   SpO2 98%   BMI 33.38 kg/m    Wt Readings from Last 3 Encounters:  09/01/23 260 lb (117.9 kg)  07/15/23 285 lb (129.3 kg)  07/06/23 245 lb (111.1 kg)     GEN: Well nourished, well developed in no acute distress NECK: No JVD; No carotid bruits CARDIAC: RRR, no murmurs, rubs, gallops RESPIRATORY:  Clear to auscultation without rales, wheezing or rhonchi  ABDOMEN: Soft, non-tender, non-distended EXTREMITIES:  No edema; No deformity, left upper arm dialysis graft with thrill  ASSESSMENT AND PLAN:   CARDIOMYOPATHY:    EF was normal in April 2021.  His ejection fraction had improved back to normal.  The last echo was 2023.  No change in therapy.     HTN:   Blood pressure is elevated.  He does not think he is taking the midodrine  anymore but I will discontinue this.  He is not having any low blood pressures that he knows of.  I am going to start amlodipine 2.5 mg daily and he will let me know if it continues to be elevated   OBESITY:  I encouraged more of the  weight loss.     CKD:   He tolerates dialysis.  No change in therapy.  CHEST PAIN:    This sounds nonanginal.  I tried to send him for a treadmill previously to evaluate this but he could not do it because of a bad knee.  Therefore, he will need perfusion imaging.  I will send him for PET.   HYPONATREMIA: I have asked him to follow this up with nephrology.  He had this when he was in the emergency room.     Follow up with me as needed  Signed, Eilleen Grates, MD

## 2023-09-01 ENCOUNTER — Encounter: Payer: Self-pay | Admitting: Cardiology

## 2023-09-01 ENCOUNTER — Ambulatory Visit: Attending: Cardiology | Admitting: Cardiology

## 2023-09-01 DIAGNOSIS — E8770 Fluid overload, unspecified: Secondary | ICD-10-CM | POA: Diagnosis not present

## 2023-09-01 DIAGNOSIS — N2581 Secondary hyperparathyroidism of renal origin: Secondary | ICD-10-CM | POA: Diagnosis not present

## 2023-09-01 DIAGNOSIS — I10 Essential (primary) hypertension: Secondary | ICD-10-CM | POA: Diagnosis not present

## 2023-09-01 DIAGNOSIS — R072 Precordial pain: Secondary | ICD-10-CM

## 2023-09-01 DIAGNOSIS — N186 End stage renal disease: Secondary | ICD-10-CM | POA: Diagnosis not present

## 2023-09-01 DIAGNOSIS — Z992 Dependence on renal dialysis: Secondary | ICD-10-CM | POA: Diagnosis not present

## 2023-09-01 MED ORDER — AMLODIPINE BESYLATE 2.5 MG PO TABS: 2.5000 mg | ORAL_TABLET | Freq: Every day | ORAL | 3 refills | Status: DC

## 2023-09-01 NOTE — Patient Instructions (Addendum)
 Medication Instructions:  Stop Midodrine  Start Amlodipine 2.5 mg once daily *If you need a refill on your cardiac medications before your next appointment, please call your pharmacy*  Lab Work: NONE If you have labs (blood work) drawn today and your tests are completely normal, you will receive your results only by: MyChart Message (if you have MyChart) OR A paper copy in the mail If you have any lab test that is abnormal or we need to change your treatment, we will call you to review the results.  Testing/Procedures: PET/CT Stress Test  Follow-Up: At [facility], you and your health needs are our priority.  As part of our continuing mission to provide you with exceptional heart care, our providers are all part of one team.  This team includes your primary Cardiologist (physician) and Advanced Practice Providers or APPs (Physician Assistants and Nurse Practitioners) who all work together to provide you with the care you need, when you need it.  Your next appointment:   As needed  Provider:   Lavonne Prairie, MD  We recommend signing up for the patient portal called MyChart.  Sign up information is provided on this After Visit Summary.  MyChart is used to connect with patients for Virtual Visits (Telemedicine).  Patients are able to view lab/test results, encounter notes, upcoming appointments, etc.  Non-urgent messages can be sent to your provider as well.   To learn more about what you can do with MyChart, go to [email or web address].   Other Instructions    Please report to Radiology at the [facility] Main Entrance 30 minutes early for your test.  [address] [place], [place] [address]    How to Prepare for Your Cardiac PET/CT Stress Test:  Nothing to eat or drink, except water , 3 hours prior to arrival time.  NO caffeine/decaffeinated products, or chocolate 12 hours prior to arrival. (Please note decaffeinated beverages (teas/coffees) still contain  caffeine).  If you have caffeine within 12 hours prior, the test will need to be rescheduled.  Medication instructions: Do not take erectile dysfunction medications for 72 hours prior to test (sildenafil, tadalafil) Do not take nitrates (isosorbide  mononitrate, Ranexa) the day before or day of test Do not take tamsulosin the day before or morning of test Hold theophylline containing medications for 12 hours. Hold Dipyridamole 48 hours prior to the test. **HOLD Nitroglycerin  and Imdur  for 2 days prior to your test  Diabetic Preparation: If able to eat breakfast prior to 3 hour fasting, you may take all medications, including your insulin . Do not worry if you miss your breakfast dose of insulin  - start at your next meal. If you do not eat prior to 3 hour fast-Hold all diabetes (oral and insulin ) medications. Patients who wear a continuous glucose monitor MUST remove the device prior to scanning.  You may take your remaining medications with water .  NO perfume, cologne or lotion on chest or abdomen area. FEMALES - Please avoid wearing dresses to this appointment.  Total time is 1 to 2 hours; you may want to bring reading material for the waiting time.  IF YOU THINK YOU MAY BE PREGNANT, OR ARE NURSING PLEASE INFORM THE TECHNOLOGIST.  In preparation for your appointment, medication and supplies will be purchased.  Appointment availability is limited, so if you need to cancel or reschedule, please call the Radiology Department Scheduler at [phone] 24 hours in advance to avoid a cancellation fee of $100.00  What to Expect When you Arrive:  Once  you arrive and check in for your appointment, you will be taken to a preparation room within the Radiology Department.  A technologist or Nurse will obtain your medical history, verify that you are correctly prepped for the exam, and explain the procedure.  Afterwards, an IV will be started in your arm and electrodes will be placed on your skin for  EKG monitoring during the stress portion of the exam. Then you will be escorted to the PET/CT scanner.  There, staff will get you positioned on the scanner and obtain a blood pressure and EKG.  During the exam, you will continue to be connected to the EKG and blood pressure machines.  A small, safe amount of a radioactive tracer will be injected in your IV to obtain a series of pictures of your heart along with an injection of a stress agent.    After your Exam:  It is recommended that you eat a meal and drink a caffeinated beverage to counter act any effects of the stress agent.  Drink plenty of fluids for the remainder of the day and urinate frequently for the first couple of hours after the exam.  Your doctor will inform you of your test results within 7-10 business days.  For more information and frequently asked questions, please visit our website: [email or web address]  For questions about your test or how to prepare for your test, please call: Cardiac Imaging Nurse Navigators Office: [phone]

## 2023-09-02 ENCOUNTER — Ambulatory Visit: Admitting: Family Medicine

## 2023-09-02 ENCOUNTER — Encounter: Payer: Self-pay | Admitting: Family Medicine

## 2023-09-02 VITALS — BP 154/82 | HR 86 | Temp 98.5°F | Ht 74.0 in | Wt 281.2 lb

## 2023-09-02 DIAGNOSIS — J329 Chronic sinusitis, unspecified: Secondary | ICD-10-CM | POA: Diagnosis not present

## 2023-09-02 DIAGNOSIS — I5032 Chronic diastolic (congestive) heart failure: Secondary | ICD-10-CM

## 2023-09-02 DIAGNOSIS — B9689 Other specified bacterial agents as the cause of diseases classified elsewhere: Secondary | ICD-10-CM

## 2023-09-02 DIAGNOSIS — N186 End stage renal disease: Secondary | ICD-10-CM | POA: Diagnosis not present

## 2023-09-02 DIAGNOSIS — R062 Wheezing: Secondary | ICD-10-CM

## 2023-09-02 DIAGNOSIS — Z6836 Body mass index (BMI) 36.0-36.9, adult: Secondary | ICD-10-CM

## 2023-09-02 DIAGNOSIS — I10 Essential (primary) hypertension: Secondary | ICD-10-CM | POA: Diagnosis not present

## 2023-09-02 DIAGNOSIS — R0981 Nasal congestion: Secondary | ICD-10-CM

## 2023-09-02 DIAGNOSIS — E66812 Obesity, class 2: Secondary | ICD-10-CM

## 2023-09-02 MED ORDER — AMOXICILLIN-POT CLAVULANATE 500-125 MG PO TABS: 1.0000 | ORAL_TABLET | Freq: Two times a day (BID) | ORAL | 0 refills | Status: AC

## 2023-09-02 MED ORDER — ALBUTEROL SULFATE HFA 108 (90 BASE) MCG/ACT IN AERS: 2.0000 | INHALATION_SPRAY | Freq: Four times a day (QID) | RESPIRATORY_TRACT | 1 refills | Status: AC | PRN

## 2023-09-02 NOTE — Assessment & Plan Note (Signed)
 May use saline nasal spray at home

## 2023-09-02 NOTE — Assessment & Plan Note (Signed)
 Discussed healthy diet and activity level

## 2023-09-02 NOTE — Patient Instructions (Signed)
 I have sent in Augmentin  for you to take twice a day for 10 days.  This medication can upset your stomach, so I tell everyone to take it with a meal.  May continue supportive care at home.   Please do not take decongestants, they can raise your blood pressure even more.  I have refilled your albuterol  for you to help with cough and wheezing.  Follow-up with me for new or worsening symptoms.

## 2023-09-02 NOTE — Progress Notes (Signed)
 Acute Office Visit  Subjective:     Patient ID: YOANDRI CONGROVE, male    DOB: [birth year], 63 y.o.   MRN: [id number]  Chief Complaint  Patient presents with   Acute Visit    Ongoing for about 2 weeks. Runny nose, coughing up a little mucus, head pressure, denies N/V. Has tried Robitussin, DayQuil/NyQuil, Sinus medication, nasal sprays    HPI Patient is in today for evaluation of possible sinus infection, for the last 2 weeks. Reports nasal congestion, rhinorrhea, fatigue, sinus pressure. Has tried over-the-counter decongestants, OTC cough syrup, OTC antihistamines. Denies known sick contacts. Denies abdominal pain, nausea, vomiting, diarrhea, rash, fever, chills, other symptoms.  Of note, he is a dialysis patient, scheduled Monday Wednesday Friday. Medical hx as outlined below.  ROS Per HPI      Objective:    BP (!) 154/82 (BP Location: Right Arm, Patient Position: Sitting, Cuff Size: Normal)   Pulse 86   Temp 98.5 F (36.9 C) (Temporal)   Ht 6' 2 (1.88 m)   Wt 281 lb 3.2 oz (127.6 kg)   SpO2 98%   BMI 36.10 kg/m    Physical Exam Vitals and nursing note reviewed.  Constitutional:      General: He is not in acute distress.    Comments: Appears fatigued  HENT:     Head: Normocephalic and atraumatic.     Right Ear: Tympanic membrane and ear canal normal.     Left Ear: Tympanic membrane and ear canal normal.     Nose: Congestion present.     Right Sinus: Frontal sinus tenderness present.     Left Sinus: Frontal sinus tenderness present.     Mouth/Throat:     Mouth: Mucous membranes are moist.     Pharynx: Oropharynx is clear. No oropharyngeal exudate or posterior oropharyngeal erythema.     Comments: Oropharyngeal cobblestoning    Eyes:     Extraocular Movements: Extraocular movements intact.    Cardiovascular:     Rate and Rhythm: Normal rate and regular rhythm.     Heart sounds: Normal heart sounds.  Pulmonary:     Effort: Pulmonary effort is normal.  No respiratory distress.     Breath sounds: No wheezing, rhonchi or rales.   Musculoskeletal:     Cervical back: Normal range of motion.  Lymphadenopathy:     Cervical: Cervical adenopathy present.   Neurological:     General: No focal deficit present.     Mental Status: He is alert and oriented to person, place, and time.    No results found for any visits on 09/02/23.      Assessment & Plan:   Bacterial sinusitis Assessment & Plan: Augmentin  to pharmacy  Orders: -     Amoxicillin -Pot Clavulanate; Take 1 tablet by mouth in the morning and at bedtime for 7 days.  Dispense: 14 tablet; Refill: 0  Nasal congestion Assessment & Plan: May use saline nasal spray at home   End stage renal disease [facility]) Assessment & Plan: Continue dialysis as scheduled   Chronic diastolic HF (heart failure) (HCC) Assessment & Plan: Stable, no increased swelling recently Continue current medication regimen   Primary hypertension Assessment & Plan: Discussed that the use of decongestants can cause increased blood pressure Discussed low-salt diet and medication adherence   Wheezing Assessment & Plan: Refilled albuterol   Orders: -     Albuterol  Sulfate HFA; Inhale 2 puffs into the lungs every 6 (six) hours as needed for wheezing or shortness  of breath.  Dispense: 18 g; Refill: 1  Class 2 severe obesity due to excess calories with serious comorbidity and body mass index (BMI) of 36.0 to 36.9 in adult [facility]) Assessment & Plan: Discussed healthy diet and activity level      Meds ordered this encounter  Medications   amoxicillin -clavulanate (AUGMENTIN ) 500-125 MG tablet    Sig: Take 1 tablet by mouth in the morning and at bedtime for 7 days.    Dispense:  14 tablet    Refill:  0   albuterol  (VENTOLIN  HFA) 108 (90 Base) MCG/ACT inhaler    Sig: Inhale 2 puffs into the lungs every 6 (six) hours as needed for wheezing or shortness of breath.    Dispense:  18 g    Refill:  1     Return if symptoms worsen or fail to improve, for As scheduled with PCP.  Wellington Half, FNP

## 2023-09-02 NOTE — Assessment & Plan Note (Signed)
 Refilled albuterol

## 2023-09-02 NOTE — Assessment & Plan Note (Signed)
Continue dialysis as scheduled.

## 2023-09-02 NOTE — Assessment & Plan Note (Signed)
 Discussed that the use of decongestants can cause increased blood pressure Discussed low-salt diet and medication adherence

## 2023-09-02 NOTE — Assessment & Plan Note (Signed)
Augmentin to pharmacy.

## 2023-09-02 NOTE — Assessment & Plan Note (Signed)
 Stable, no increased swelling recently Continue current medication regimen

## 2023-09-03 DIAGNOSIS — Z992 Dependence on renal dialysis: Secondary | ICD-10-CM | POA: Diagnosis not present

## 2023-09-03 DIAGNOSIS — E8770 Fluid overload, unspecified: Secondary | ICD-10-CM | POA: Diagnosis not present

## 2023-09-03 DIAGNOSIS — N186 End stage renal disease: Secondary | ICD-10-CM | POA: Diagnosis not present

## 2023-09-03 DIAGNOSIS — N2581 Secondary hyperparathyroidism of renal origin: Secondary | ICD-10-CM | POA: Diagnosis not present

## 2023-09-06 DIAGNOSIS — Z992 Dependence on renal dialysis: Secondary | ICD-10-CM | POA: Diagnosis not present

## 2023-09-06 DIAGNOSIS — N2581 Secondary hyperparathyroidism of renal origin: Secondary | ICD-10-CM | POA: Diagnosis not present

## 2023-09-06 DIAGNOSIS — N186 End stage renal disease: Secondary | ICD-10-CM | POA: Diagnosis not present

## 2023-09-06 DIAGNOSIS — E8770 Fluid overload, unspecified: Secondary | ICD-10-CM | POA: Diagnosis not present

## 2023-09-08 DIAGNOSIS — Z992 Dependence on renal dialysis: Secondary | ICD-10-CM | POA: Diagnosis not present

## 2023-09-08 DIAGNOSIS — E8770 Fluid overload, unspecified: Secondary | ICD-10-CM | POA: Diagnosis not present

## 2023-09-08 DIAGNOSIS — N186 End stage renal disease: Secondary | ICD-10-CM | POA: Diagnosis not present

## 2023-09-08 DIAGNOSIS — N2581 Secondary hyperparathyroidism of renal origin: Secondary | ICD-10-CM | POA: Diagnosis not present

## 2023-09-10 DIAGNOSIS — N186 End stage renal disease: Secondary | ICD-10-CM | POA: Diagnosis not present

## 2023-09-10 DIAGNOSIS — N2581 Secondary hyperparathyroidism of renal origin: Secondary | ICD-10-CM | POA: Diagnosis not present

## 2023-09-10 DIAGNOSIS — Z992 Dependence on renal dialysis: Secondary | ICD-10-CM | POA: Diagnosis not present

## 2023-09-10 DIAGNOSIS — E8770 Fluid overload, unspecified: Secondary | ICD-10-CM | POA: Diagnosis not present

## 2023-09-13 DIAGNOSIS — N186 End stage renal disease: Secondary | ICD-10-CM | POA: Diagnosis not present

## 2023-09-13 DIAGNOSIS — N2581 Secondary hyperparathyroidism of renal origin: Secondary | ICD-10-CM | POA: Diagnosis not present

## 2023-09-13 DIAGNOSIS — Z992 Dependence on renal dialysis: Secondary | ICD-10-CM | POA: Diagnosis not present

## 2023-09-13 DIAGNOSIS — E8770 Fluid overload, unspecified: Secondary | ICD-10-CM | POA: Diagnosis not present

## 2023-09-15 ENCOUNTER — Telehealth: Payer: Self-pay | Admitting: Internal Medicine

## 2023-09-15 DIAGNOSIS — N186 End stage renal disease: Secondary | ICD-10-CM | POA: Diagnosis not present

## 2023-09-15 DIAGNOSIS — N2581 Secondary hyperparathyroidism of renal origin: Secondary | ICD-10-CM | POA: Diagnosis not present

## 2023-09-15 DIAGNOSIS — Z992 Dependence on renal dialysis: Secondary | ICD-10-CM | POA: Diagnosis not present

## 2023-09-15 DIAGNOSIS — E8770 Fluid overload, unspecified: Secondary | ICD-10-CM | POA: Diagnosis not present

## 2023-09-15 MED ORDER — IBUPROFEN 800 MG PO TABS: 800.0000 mg | ORAL_TABLET | Freq: Three times a day (TID) | ORAL | 1 refills | Status: DC | PRN

## 2023-09-15 NOTE — Telephone Encounter (Signed)
 Ok this is done

## 2023-09-15 NOTE — Telephone Encounter (Signed)
 Patient called and is having pain in his knee. He would like for Dr. Norleen to call him in some Ibuprofen  800 mg.  To [facility] at [address] and [address] - Patient states that Dr. Norleen usually does this for him when his knee is bothering him.

## 2023-09-17 DIAGNOSIS — E8770 Fluid overload, unspecified: Secondary | ICD-10-CM | POA: Diagnosis not present

## 2023-09-17 DIAGNOSIS — Z992 Dependence on renal dialysis: Secondary | ICD-10-CM | POA: Diagnosis not present

## 2023-09-17 DIAGNOSIS — N2581 Secondary hyperparathyroidism of renal origin: Secondary | ICD-10-CM | POA: Diagnosis not present

## 2023-09-17 DIAGNOSIS — N186 End stage renal disease: Secondary | ICD-10-CM | POA: Diagnosis not present

## 2023-09-18 ENCOUNTER — Encounter (HOSPITAL_COMMUNITY): Payer: Self-pay

## 2023-09-20 ENCOUNTER — Telehealth (HOSPITAL_COMMUNITY): Payer: Self-pay | Admitting: *Deleted

## 2023-09-20 DIAGNOSIS — I129 Hypertensive chronic kidney disease with stage 1 through stage 4 chronic kidney disease, or unspecified chronic kidney disease: Secondary | ICD-10-CM | POA: Diagnosis not present

## 2023-09-20 DIAGNOSIS — Z992 Dependence on renal dialysis: Secondary | ICD-10-CM | POA: Diagnosis not present

## 2023-09-20 DIAGNOSIS — N2581 Secondary hyperparathyroidism of renal origin: Secondary | ICD-10-CM | POA: Diagnosis not present

## 2023-09-20 DIAGNOSIS — N186 End stage renal disease: Secondary | ICD-10-CM | POA: Diagnosis not present

## 2023-09-20 DIAGNOSIS — E8770 Fluid overload, unspecified: Secondary | ICD-10-CM | POA: Diagnosis not present

## 2023-09-20 NOTE — Telephone Encounter (Signed)
 Reaching out to patient to offer assistance regarding upcoming cardiac imaging study; pt verbalizes understanding of appt date/time, parking situation and where to check in, pre-test NPO status and verified current allergies; name and call back number provided for further questions should they arise  Chantal Requena RN Navigator Cardiac Imaging [facility] [phone] office [phone] cell  Patient aware to avoid caffeine 12 hours prior to his cardiac PET study.

## 2023-09-21 ENCOUNTER — Encounter (HOSPITAL_COMMUNITY)
Admission: RE | Admit: 2023-09-21 | Discharge: 2023-09-21 | Disposition: A | Discharge status: Home / Self Care | Source: Ambulatory Visit | Attending: Cardiology | Admitting: Cardiology

## 2023-09-21 DIAGNOSIS — R072 Precordial pain: Secondary | ICD-10-CM | POA: Diagnosis not present

## 2023-09-21 LAB — NM PET CT CARDIAC PERFUSION MULTI W/ABSOLUTE BLOODFLOW
LV dias vol: 161 mL (ref 62–150)
LV sys vol: 86 mL (ref 4.2–5.8)
MBFR: 1.34
Nuc Rest EF: 47 %
Nuc Stress EF: 51 %
Peak HR: 88 {beats}/min
Rest HR: 76 {beats}/min
Rest MBF: 0.96 ml/g/min
Rest Nuclear Isotope Dose: 28.1 mCi
Rest perfusion cavity size (mL): 161 mL
ST Depression (mm): 0 mm
Stress MBF: 1.29 ml/g/min
Stress Nuclear Isotope Dose: 28.5 mCi
Stress perfusion cavity size (mL): 185 mL
Stress/rest perfusion ratio: 1.15

## 2023-09-21 MED ORDER — RUBIDIUM RB82 GENERATOR (RUBYFILL)
28.4800 | PACK | Freq: Once | INTRAVENOUS | Status: AC
  Administered 2023-09-21: 28.48 via INTRAVENOUS

## 2023-09-21 MED ORDER — REGADENOSON 0.4 MG/5ML IV SOLN
0.4000 mg | Freq: Once | INTRAVENOUS | Status: AC
  Administered 2023-09-21: 0.4 mg via INTRAVENOUS

## 2023-09-21 MED ORDER — REGADENOSON 0.4 MG/5ML IV SOLN
INTRAVENOUS | Status: AC
  Filled 2023-09-21: qty 5

## 2023-09-21 MED ORDER — RUBIDIUM RB82 GENERATOR (RUBYFILL)
28.1300 | PACK | Freq: Once | INTRAVENOUS | Status: AC
  Administered 2023-09-21: 28.13 via INTRAVENOUS

## 2023-09-21 NOTE — Progress Notes (Signed)
 Pt. Tolerated lexi scan well.

## 2023-09-22 ENCOUNTER — Ambulatory Visit: Payer: Self-pay | Admitting: Cardiology

## 2023-09-22 DIAGNOSIS — N186 End stage renal disease: Secondary | ICD-10-CM | POA: Diagnosis not present

## 2023-09-22 DIAGNOSIS — N2581 Secondary hyperparathyroidism of renal origin: Secondary | ICD-10-CM | POA: Diagnosis not present

## 2023-09-22 DIAGNOSIS — Z992 Dependence on renal dialysis: Secondary | ICD-10-CM | POA: Diagnosis not present

## 2023-09-22 NOTE — Telephone Encounter (Signed)
-----   Message from Lynwood Schilling sent at 09/22/2023  1:03 PM EDT ----- Called with results.  High risk study.  He needs cardiac cath.  The patient understands that risks included but are not limited to stroke (1 in 1000), death (1 in 1000), kidney failure [usually  temporary] (1 in 500), bleeding (1 in 200), allergic reaction [possibly serious] (1 in 200).  The patient understands and agrees to proceed.    ----- Message ----- From: Interface, Rad Results In Sent: 09/21/2023   9:58 AM EDT To: Lynwood Schilling, MD

## 2023-09-22 NOTE — Telephone Encounter (Signed)
 Spoke with pt regarding a cath. Pt was scheduled July 9th for an appointment with Dr. Michele to get set up for cath. Pt requires EKG, labs and H&P is close to needing to be updated. Pt aware of appointment. Pt verbalized understanding. All questions if any were answered.

## 2023-09-24 DIAGNOSIS — N186 End stage renal disease: Secondary | ICD-10-CM | POA: Diagnosis not present

## 2023-09-24 DIAGNOSIS — Z992 Dependence on renal dialysis: Secondary | ICD-10-CM | POA: Diagnosis not present

## 2023-09-24 DIAGNOSIS — N2581 Secondary hyperparathyroidism of renal origin: Secondary | ICD-10-CM | POA: Diagnosis not present

## 2023-09-25 ENCOUNTER — Encounter (HOSPITAL_COMMUNITY): Payer: Self-pay

## 2023-09-25 ENCOUNTER — Other Ambulatory Visit: Payer: Self-pay

## 2023-09-25 ENCOUNTER — Emergency Department (HOSPITAL_COMMUNITY)

## 2023-09-25 ENCOUNTER — Inpatient Hospital Stay (HOSPITAL_COMMUNITY)
Admission: EM | Admit: 2023-09-25 | Discharge: 2023-09-28 | DRG: 280 | Disposition: A | Discharge status: Home / Self Care | Attending: Internal Medicine | Admitting: Internal Medicine

## 2023-09-25 DIAGNOSIS — G4733 Obstructive sleep apnea (adult) (pediatric): Secondary | ICD-10-CM | POA: Diagnosis not present

## 2023-09-25 DIAGNOSIS — E669 Obesity, unspecified: Secondary | ICD-10-CM | POA: Diagnosis present

## 2023-09-25 DIAGNOSIS — D631 Anemia in chronic kidney disease: Secondary | ICD-10-CM | POA: Diagnosis present

## 2023-09-25 DIAGNOSIS — R0789 Other chest pain: Principal | ICD-10-CM

## 2023-09-25 DIAGNOSIS — R072 Precordial pain: Secondary | ICD-10-CM | POA: Diagnosis not present

## 2023-09-25 DIAGNOSIS — I2582 Chronic total occlusion of coronary artery: Secondary | ICD-10-CM | POA: Diagnosis present

## 2023-09-25 DIAGNOSIS — K219 Gastro-esophageal reflux disease without esophagitis: Secondary | ICD-10-CM | POA: Diagnosis not present

## 2023-09-25 DIAGNOSIS — Z79899 Other long term (current) drug therapy: Secondary | ICD-10-CM | POA: Diagnosis not present

## 2023-09-25 DIAGNOSIS — N186 End stage renal disease: Secondary | ICD-10-CM | POA: Diagnosis present

## 2023-09-25 DIAGNOSIS — I132 Hypertensive heart and chronic kidney disease with heart failure and with stage 5 chronic kidney disease, or end stage renal disease: Secondary | ICD-10-CM | POA: Diagnosis present

## 2023-09-25 DIAGNOSIS — I428 Other cardiomyopathies: Secondary | ICD-10-CM | POA: Diagnosis not present

## 2023-09-25 DIAGNOSIS — I152 Hypertension secondary to endocrine disorders: Secondary | ICD-10-CM | POA: Diagnosis present

## 2023-09-25 DIAGNOSIS — E1140 Type 2 diabetes mellitus with diabetic neuropathy, unspecified: Secondary | ICD-10-CM | POA: Diagnosis present

## 2023-09-25 DIAGNOSIS — Z6835 Body mass index (BMI) 35.0-35.9, adult: Secondary | ICD-10-CM

## 2023-09-25 DIAGNOSIS — I2510 Atherosclerotic heart disease of native coronary artery without angina pectoris: Secondary | ICD-10-CM | POA: Diagnosis present

## 2023-09-25 DIAGNOSIS — Z7982 Long term (current) use of aspirin: Secondary | ICD-10-CM

## 2023-09-25 DIAGNOSIS — I10 Essential (primary) hypertension: Secondary | ICD-10-CM | POA: Diagnosis present

## 2023-09-25 DIAGNOSIS — N184 Chronic kidney disease, stage 4 (severe): Secondary | ICD-10-CM | POA: Diagnosis not present

## 2023-09-25 DIAGNOSIS — E871 Hypo-osmolality and hyponatremia: Secondary | ICD-10-CM | POA: Diagnosis not present

## 2023-09-25 DIAGNOSIS — N185 Chronic kidney disease, stage 5: Secondary | ICD-10-CM | POA: Diagnosis present

## 2023-09-25 DIAGNOSIS — Z79890 Hormone replacement therapy (postmenopausal): Secondary | ICD-10-CM | POA: Diagnosis not present

## 2023-09-25 DIAGNOSIS — D010 Carcinoma in situ of colon: Secondary | ICD-10-CM | POA: Diagnosis present

## 2023-09-25 DIAGNOSIS — Z992 Dependence on renal dialysis: Secondary | ICD-10-CM

## 2023-09-25 DIAGNOSIS — E1122 Type 2 diabetes mellitus with diabetic chronic kidney disease: Secondary | ICD-10-CM | POA: Diagnosis present

## 2023-09-25 DIAGNOSIS — E1169 Type 2 diabetes mellitus with other specified complication: Secondary | ICD-10-CM | POA: Diagnosis not present

## 2023-09-25 DIAGNOSIS — Z8611 Personal history of tuberculosis: Secondary | ICD-10-CM

## 2023-09-25 DIAGNOSIS — I25110 Atherosclerotic heart disease of native coronary artery with unstable angina pectoris: Secondary | ICD-10-CM | POA: Diagnosis not present

## 2023-09-25 DIAGNOSIS — R079 Chest pain, unspecified: Secondary | ICD-10-CM | POA: Diagnosis present

## 2023-09-25 DIAGNOSIS — R42 Dizziness and giddiness: Secondary | ICD-10-CM | POA: Diagnosis not present

## 2023-09-25 DIAGNOSIS — N2581 Secondary hyperparathyroidism of renal origin: Secondary | ICD-10-CM | POA: Diagnosis not present

## 2023-09-25 DIAGNOSIS — G629 Polyneuropathy, unspecified: Secondary | ICD-10-CM

## 2023-09-25 DIAGNOSIS — E785 Hyperlipidemia, unspecified: Secondary | ICD-10-CM | POA: Diagnosis present

## 2023-09-25 DIAGNOSIS — Z823 Family history of stroke: Secondary | ICD-10-CM

## 2023-09-25 DIAGNOSIS — M898X9 Other specified disorders of bone, unspecified site: Secondary | ICD-10-CM | POA: Diagnosis present

## 2023-09-25 DIAGNOSIS — Z86004 Personal history of in-situ neoplasm of other and unspecified digestive organs: Secondary | ICD-10-CM

## 2023-09-25 DIAGNOSIS — I214 Non-ST elevation (NSTEMI) myocardial infarction: Secondary | ICD-10-CM | POA: Diagnosis not present

## 2023-09-25 DIAGNOSIS — Z87891 Personal history of nicotine dependence: Secondary | ICD-10-CM | POA: Diagnosis not present

## 2023-09-25 DIAGNOSIS — E890 Postprocedural hypothyroidism: Secondary | ICD-10-CM | POA: Diagnosis present

## 2023-09-25 DIAGNOSIS — N250 Renal osteodystrophy: Secondary | ICD-10-CM | POA: Diagnosis not present

## 2023-09-25 DIAGNOSIS — I700 Atherosclerosis of aorta: Secondary | ICD-10-CM | POA: Diagnosis not present

## 2023-09-25 DIAGNOSIS — I493 Ventricular premature depolarization: Secondary | ICD-10-CM | POA: Diagnosis present

## 2023-09-25 DIAGNOSIS — Z8249 Family history of ischemic heart disease and other diseases of the circulatory system: Secondary | ICD-10-CM | POA: Diagnosis not present

## 2023-09-25 DIAGNOSIS — I429 Cardiomyopathy, unspecified: Secondary | ICD-10-CM | POA: Diagnosis present

## 2023-09-25 DIAGNOSIS — Z86718 Personal history of other venous thrombosis and embolism: Secondary | ICD-10-CM

## 2023-09-25 DIAGNOSIS — Z85850 Personal history of malignant neoplasm of thyroid: Secondary | ICD-10-CM

## 2023-09-25 DIAGNOSIS — N1831 Chronic kidney disease, stage 3a: Secondary | ICD-10-CM | POA: Diagnosis present

## 2023-09-25 DIAGNOSIS — I5032 Chronic diastolic (congestive) heart failure: Secondary | ICD-10-CM | POA: Diagnosis present

## 2023-09-25 DIAGNOSIS — M109 Gout, unspecified: Secondary | ICD-10-CM | POA: Diagnosis present

## 2023-09-25 DIAGNOSIS — I120 Hypertensive chronic kidney disease with stage 5 chronic kidney disease or end stage renal disease: Secondary | ICD-10-CM | POA: Diagnosis not present

## 2023-09-25 DIAGNOSIS — E1159 Type 2 diabetes mellitus with other circulatory complications: Secondary | ICD-10-CM

## 2023-09-25 LAB — MAGNESIUM: Magnesium: 1.7 mg/dL (ref 1.7–2.4)

## 2023-09-25 LAB — CBC
HCT: 32.6 % — ABNORMAL LOW (ref 39.0–52.0)
Hemoglobin: 10.6 g/dL — ABNORMAL LOW (ref 13.0–17.0)
MCH: 28.3 pg (ref 26.0–34.0)
MCHC: 32.5 g/dL (ref 30.0–36.0)
MCV: 86.9 fL (ref 80.0–100.0)
Platelets: 135 K/uL — ABNORMAL LOW (ref 150–400)
RBC: 3.75 MIL/uL — ABNORMAL LOW (ref 4.22–5.81)
RDW: 13.6 % (ref 11.5–15.5)
WBC: 5.8 K/uL (ref 4.0–10.5)
nRBC: 0 % (ref 0.0–0.2)

## 2023-09-25 LAB — HEPATIC FUNCTION PANEL
ALT: 17 U/L (ref 0–44)
AST: 16 U/L (ref 15–41)
Albumin: 3.8 g/dL (ref 3.5–5.0)
Alkaline Phosphatase: 54 U/L (ref 38–126)
Bilirubin, Direct: 0.1 mg/dL (ref 0.0–0.2)
Indirect Bilirubin: 0.6 mg/dL (ref 0.3–0.9)
Total Bilirubin: 0.7 mg/dL (ref 0.0–1.2)
Total Protein: 6.3 g/dL — ABNORMAL LOW (ref 6.5–8.1)

## 2023-09-25 LAB — BRAIN NATRIURETIC PEPTIDE: B Natriuretic Peptide: 276.6 pg/mL — ABNORMAL HIGH (ref 0.0–100.0)

## 2023-09-25 LAB — URINALYSIS, ROUTINE W REFLEX MICROSCOPIC
Bacteria, UA: NONE SEEN
Bilirubin Urine: NEGATIVE
Glucose, UA: NEGATIVE mg/dL
Hgb urine dipstick: NEGATIVE
Ketones, ur: NEGATIVE mg/dL
Leukocytes,Ua: NEGATIVE
Nitrite: NEGATIVE
Protein, ur: 100 mg/dL — AB
Specific Gravity, Urine: 1.004 — ABNORMAL LOW (ref 1.005–1.030)
pH: 8 (ref 5.0–8.0)

## 2023-09-25 LAB — LIPASE, BLOOD: Lipase: 38 U/L (ref 11–51)

## 2023-09-25 LAB — BASIC METABOLIC PANEL WITH GFR
Anion gap: 14 (ref 5–15)
BUN: 23 mg/dL (ref 8–23)
CO2: 29 mmol/L (ref 22–32)
Calcium: 10.1 mg/dL (ref 8.9–10.3)
Chloride: 86 mmol/L — ABNORMAL LOW (ref 98–111)
Creatinine, Ser: 7.4 mg/dL — ABNORMAL HIGH (ref 0.61–1.24)
GFR, Estimated: 8 mL/min — ABNORMAL LOW (ref >60)
Glucose, Bld: 98 mg/dL (ref 70–99)
Potassium: 3.8 mmol/L (ref 3.5–5.1)
Sodium: 129 mmol/L — ABNORMAL LOW (ref 135–145)

## 2023-09-25 LAB — TROPONIN I (HIGH SENSITIVITY)
Troponin I (High Sensitivity): 31 ng/L — ABNORMAL HIGH (ref <18)
Troponin I (High Sensitivity): 31 ng/L — ABNORMAL HIGH (ref <18)

## 2023-09-25 LAB — HIV ANTIBODY (ROUTINE TESTING W REFLEX): HIV Screen 4th Generation wRfx: NONREACTIVE

## 2023-09-25 MED ORDER — ACETAMINOPHEN 325 MG PO TABS
650.0000 mg | ORAL_TABLET | Freq: Four times a day (QID) | ORAL | Status: DC | PRN
  Administered 2023-09-26: 650 mg via ORAL
  Filled 2023-09-25: qty 2

## 2023-09-25 MED ORDER — ISOSORBIDE MONONITRATE ER 30 MG PO TB24
30.0000 mg | ORAL_TABLET | Freq: Every day | ORAL | Status: DC
  Administered 2023-09-26: 30 mg via ORAL
  Filled 2023-09-25 (×2): qty 1

## 2023-09-25 MED ORDER — PANTOPRAZOLE SODIUM 40 MG PO TBEC
40.0000 mg | DELAYED_RELEASE_TABLET | Freq: Every day | ORAL | Status: DC
  Administered 2023-09-26 – 2023-09-28 (×3): 40 mg via ORAL
  Filled 2023-09-25 (×3): qty 1

## 2023-09-25 MED ORDER — GABAPENTIN 300 MG PO CAPS
300.0000 mg | ORAL_CAPSULE | Freq: Every day | ORAL | Status: DC
  Administered 2023-09-25 – 2023-09-27 (×3): 300 mg via ORAL
  Filled 2023-09-25 (×3): qty 1

## 2023-09-25 MED ORDER — ASPIRIN 81 MG PO CHEW
243.0000 mg | CHEWABLE_TABLET | Freq: Once | ORAL | Status: AC
  Administered 2023-09-25: 243 mg via ORAL
  Filled 2023-09-25: qty 3

## 2023-09-25 MED ORDER — ROSUVASTATIN CALCIUM 5 MG PO TABS
10.0000 mg | ORAL_TABLET | Freq: Every day | ORAL | Status: DC
  Administered 2023-09-26 – 2023-09-28 (×3): 10 mg via ORAL
  Filled 2023-09-25 (×3): qty 2

## 2023-09-25 MED ORDER — ASPIRIN 81 MG PO TBEC
81.0000 mg | DELAYED_RELEASE_TABLET | Freq: Every day | ORAL | Status: DC
  Administered 2023-09-26: 81 mg via ORAL
  Filled 2023-09-25 (×2): qty 1

## 2023-09-25 MED ORDER — AMLODIPINE BESYLATE 2.5 MG PO TABS
2.5000 mg | ORAL_TABLET | Freq: Every day | ORAL | Status: DC
  Administered 2023-09-26: 2.5 mg via ORAL
  Filled 2023-09-25: qty 1

## 2023-09-25 MED ORDER — LACTATED RINGERS IV BOLUS
250.0000 mL | Freq: Once | INTRAVENOUS | Status: AC
  Administered 2023-09-25: 250 mL via INTRAVENOUS

## 2023-09-25 MED ORDER — ACETAMINOPHEN 650 MG RE SUPP: 650.0000 mg | Freq: Four times a day (QID) | RECTAL | Status: DC | PRN

## 2023-09-25 MED ORDER — MECLIZINE HCL 25 MG PO TABS
12.5000 mg | ORAL_TABLET | Freq: Once | ORAL | Status: AC
  Administered 2023-09-25: 12.5 mg via ORAL
  Filled 2023-09-25: qty 1

## 2023-09-25 MED ORDER — SODIUM CHLORIDE 0.9% FLUSH
3.0000 mL | Freq: Two times a day (BID) | INTRAVENOUS | Status: DC
  Administered 2023-09-25 – 2023-09-28 (×5): 3 mL via INTRAVENOUS

## 2023-09-25 MED ORDER — FUROSEMIDE 40 MG PO TABS
80.0000 mg | ORAL_TABLET | Freq: Every day | ORAL | Status: DC
  Administered 2023-09-26 – 2023-09-28 (×3): 80 mg via ORAL
  Filled 2023-09-25 (×3): qty 2

## 2023-09-25 MED ORDER — HEPARIN SODIUM (PORCINE) 5000 UNIT/ML IJ SOLN
5000.0000 [IU] | Freq: Three times a day (TID) | INTRAMUSCULAR | Status: DC
  Administered 2023-09-25 – 2023-09-28 (×8): 5000 [IU] via SUBCUTANEOUS
  Filled 2023-09-25 (×8): qty 1

## 2023-09-25 MED ORDER — POLYETHYLENE GLYCOL 3350 17 G PO PACK: 17.0000 g | PACK | Freq: Every day | ORAL | Status: DC | PRN

## 2023-09-25 MED ORDER — LEVOTHYROXINE SODIUM 75 MCG PO TABS
125.0000 ug | ORAL_TABLET | Freq: Every day | ORAL | Status: DC
  Administered 2023-09-26 – 2023-09-28 (×3): 125 ug via ORAL
  Filled 2023-09-25 (×3): qty 1

## 2023-09-25 NOTE — ED Notes (Signed)
 Called dietary for dinner tray.

## 2023-09-25 NOTE — ED Provider Notes (Signed)
 [place] EMERGENCY DEPARTMENT AT [facility] Provider Note   CSN: [id number] Arrival date & time: 09/25/23  1208     Patient presents with: Chest Pain and Dizziness   Craig Flynn is a 63 y.o. male.    Chest Pain Dizziness Associated symptoms: chest pain   Patient presents for chest pain.  Medical history includes HTN, GERD, ESRD, CHF, arthritis, gout, pancreatitis, prior episodes of tinnitus and postural lightheadedness.  He last underwent dialysis yesterday.  He takes daily 81 mg aspirin .  He is not on any other blood thinners.  Other medications include Imdur , Lasix , Coreg , amlodipine .  He underwent nuclear stress test 4 days ago.  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  His plan was for office visit this week on Wednesday for EKG and lab work.  This morning, he woke up in his normal state of health.  While eating breakfast, he had chest pressure on the right side of his chest in addition to some lightheadedness.  Chest discomfort lasted for 1.5 hours.  His chest discomfort has resolved.  He currently has some mild lightheadedness at rest.  This is worsened with standing.     Prior to Admission medications   Medication Sig Start Date End Date Taking? Authorizing Provider  acetaminophen  (TYLENOL ) 500 MG tablet Take 1,000 mg by mouth every 6 (six) hours as needed for moderate pain.    [provider]  albuterol  (VENTOLIN  HFA) 108 (90 Base) MCG/ACT inhaler Inhale 2 puffs into the lungs every 6 (six) hours as needed for wheezing or shortness of breath. 09/02/23   Alvia Corean CROME, FNP  amLODipine  (NORVASC ) 2.5 MG tablet Take 1 tablet (2.5 mg total) by mouth daily. 09/01/23   Lavona Agent, MD  aspirin  EC 81 MG tablet Take 81 mg by mouth daily.    [provider]  carvedilol  (COREG ) 25 MG tablet TAKE 1 TABLET(25 MG) BY MOUTH TWICE DAILY 12/23/21   Norleen Agent ORN, MD  diazepam  (VALIUM ) 5 MG tablet 1 tab by mouth 45 min prior to MRI  05/23/21   Norleen Agent ORN, MD  diclofenac  Sodium (VOLTAREN ) 1 % GEL Apply 4 g topically 4 (four) times daily. 05/05/23   Emil Share, DO  fenofibrate  54 MG tablet TAKE 1 TABLET BY MOUTH EVERY DAY 08/07/20   Lavona Agent, MD  furosemide  (LASIX ) 80 MG tablet Take 80 mg by mouth daily. 02/02/22   [provider]  gabapentin  (NEURONTIN ) 300 MG capsule TAKE 1 CAPSULE(300 MG) BY MOUTH AT BEDTIME 03/09/23   Norleen Agent ORN, MD  ibuprofen  (ADVIL ) 800 MG tablet Take 1 tablet (800 mg total) by mouth every 8 (eight) hours as needed. 09/15/23   Norleen Agent ORN, MD  isosorbide  mononitrate (IMDUR ) 30 MG 24 hr tablet TAKE 2 TABLETS BY MOUTH DAILY. SCHEDULE OFFICE VISIT FOR FUTURE REFILLS 08/21/22   Norleen Agent ORN, MD  ketoconazole  (NIZORAL ) 2 % cream Apply 1 application topically daily. 02/11/21   Sikora, Rebecca, DPM  levothyroxine  (SYNTHROID ) 125 MCG tablet TAKE 1 TABLET(125 MCG) BY MOUTH EVERY MORNING 05/06/23   Norleen Agent ORN, MD  meclizine  (ANTIVERT ) 25 MG tablet Take 1 tablet (25 mg total) by mouth 3 (three) times daily as needed for dizziness. 11/07/21   Norleen Agent ORN, MD  meclizine  (ANTIVERT ) 25 MG tablet Take 1 tablet (25 mg total) by mouth 3 (three) times daily as needed for dizziness. 06/17/23   Minnie Tinnie BRAVO, PA  nitroGLYCERIN  (NITROSTAT ) 0.4 MG SL tablet  DISSOLVE 1 TABLET UNDER THE TONGUE EVERY 5 MINUTES AS NEEDED FOR CHEST PAIN Patient taking differently: Place 0.4 mg under the tongue every 5 (five) minutes as needed for chest pain. 03/29/19   Jesus Elspeth Sieving, MD  pantoprazole  (PROTONIX ) 40 MG tablet Take 1 tablet (40 mg total) by mouth daily. Patient taking differently: Take 80 mg by mouth daily. 10/21/21   Norleen Lynwood ORN, MD  rosuvastatin  (CRESTOR ) 10 MG tablet Take 1 tablet (10 mg total) by mouth daily. 07/15/23   Norleen Lynwood ORN, MD  thiamine (VITAMIN B-1) 50 MG tablet Take 1 tablet (50 mg total) by mouth daily. 03/25/20   Joshua Debby CROME, MD  tiZANidine  (ZANAFLEX ) 2 MG tablet Take 1 tablet (2 mg  total) by mouth daily. 02/20/22   Raspet, Erin K, PA-C    Allergies: Patient has no known allergies.    Review of Systems  Cardiovascular:  Positive for chest pain.  Neurological:  Positive for light-headedness.  All other systems reviewed and are negative.   Updated Vital Signs BP (!) 187/89   Pulse 83   Temp 98 F (36.7 C)   Resp 15   Ht 6' 2 (1.88 m)   Wt 127 kg   SpO2 100%   BMI 35.95 kg/m   Physical Exam Vitals and nursing note reviewed.  Constitutional:      General: He is not in acute distress.    Appearance: He is well-developed. He is not ill-appearing, toxic-appearing or diaphoretic.  HENT:     Head: Normocephalic and atraumatic.  Eyes:     Conjunctiva/sclera: Conjunctivae normal.  Cardiovascular:     Rate and Rhythm: Normal rate and regular rhythm.     Heart sounds: No murmur heard. Pulmonary:     Effort: Pulmonary effort is normal. No tachypnea or respiratory distress.     Breath sounds: Normal breath sounds.  Chest:     Chest wall: No tenderness.  Abdominal:     Palpations: Abdomen is soft.     Tenderness: There is no abdominal tenderness.  Musculoskeletal:        General: No swelling. Normal range of motion.     Cervical back: Normal range of motion and neck supple.  Skin:    General: Skin is warm and dry.  Neurological:     General: No focal deficit present.     Mental Status: He is alert and oriented to person, place, and time.  Psychiatric:        Mood and Affect: Mood normal.        Behavior: Behavior normal.     (all labs ordered are listed, but only abnormal results are displayed) Labs Reviewed  BASIC METABOLIC PANEL WITH GFR - Abnormal; Notable for the following components:      Result Value   Sodium 129 (*)    Chloride 86 (*)    Creatinine, Ser 7.40 (*)    GFR, Estimated 8 (*)    All other components within normal limits  CBC - Abnormal; Notable for the following components:   RBC 3.75 (*)    Hemoglobin 10.6 (*)    HCT 32.6  (*)    Platelets 135 (*)    All other components within normal limits  HEPATIC FUNCTION PANEL - Abnormal; Notable for the following components:   Total Protein 6.3 (*)    All other components within normal limits  BRAIN NATRIURETIC PEPTIDE - Abnormal; Notable for the following components:   B Natriuretic Peptide 276.6 (*)  All other components within normal limits  URINALYSIS, ROUTINE W REFLEX MICROSCOPIC - Abnormal; Notable for the following components:   Color, Urine STRAW (*)    Specific Gravity, Urine 1.004 (*)    Protein, ur 100 (*)    All other components within normal limits  TROPONIN I (HIGH SENSITIVITY) - Abnormal; Notable for the following components:   Troponin I (High Sensitivity) 31 (*)    All other components within normal limits  TROPONIN I (HIGH SENSITIVITY) - Abnormal; Notable for the following components:   Troponin I (High Sensitivity) 31 (*)    All other components within normal limits  MAGNESIUM   LIPASE, BLOOD    EKG: EKG Interpretation Date/Time:  Saturday September 25 2023 12:14:36 EDT Ventricular Rate:  90 PR Interval:  206 QRS Duration:  94 QT Interval:  378 QTC Calculation: 462 R Axis:   22  Text Interpretation: Sinus rhythm Premature ventricular complexes Confirmed by Melvenia Motto ([id number]) on 09/25/2023 1:01:44 PM  Radiology: [facility] Chest 2 View Result Date: 09/25/2023 CLINICAL DATA:  Chest pain with dizziness for a while. EXAM: CHEST - 2 VIEW COMPARISON:  Radiographs 06/17/2023 and 05/05/2023. CT 02/27/2015. Cardiac PET-CT 09/21/2023. FINDINGS: The heart size and mediastinal contours are stable with aortic atherosclerosis. The lungs are clear. There is no pleural effusion or pneumothorax. No acute osseous findings. IMPRESSION: No evidence of acute cardiopulmonary process. Aortic atherosclerosis. Electronically Signed   By: Elsie Perone M.D.   On: 09/25/2023 13:46     Procedures   Medications Ordered in the ED  aspirin  chewable tablet 243 mg (243 mg Oral  Given 09/25/23 1418)  meclizine  (ANTIVERT ) tablet 12.5 mg (12.5 mg Oral Given 09/25/23 1421)  lactated ringers  bolus 250 mL (0 mLs Intravenous Stopped 09/25/23 1523)                                    Medical Decision Making Amount and/or Complexity of Data Reviewed Labs: ordered. Radiology: ordered.  Risk OTC drugs. Decision regarding hospitalization.   This patient presents to the ED for concern of chest discomfort, this involves an extensive number of treatment options, and is a complaint that carries with it a high risk of complications and morbidity.  The differential diagnosis includes ACS, GERD, pericarditis, anxiety, CHF   Co morbidities / Chronic conditions that complicate the patient evaluation  HTN, GERD, ESRD, CHF, arthritis, gout, pancreatitis, prior episodes of tinnitus and postural lightheadedness   Additional history obtained:  Additional history obtained from EMR External records from outside source obtained and reviewed including N/A   Lab Tests:  I Ordered, and personally interpreted labs.  The pertinent results include: Elevated creatinine consistent with ESRD, no leukocytosis, slight elevation in troponin but stable on repeat, mild elevation in BNP.   Imaging Studies ordered:  I ordered imaging studies including chest x-ray I independently visualized and interpreted imaging which showed no acute findings I agree with the radiologist interpretation   Cardiac Monitoring: / EKG:  The patient was maintained on a cardiac monitor.  I personally viewed and interpreted the cardiac monitored which showed an underlying rhythm of: Sinus rhythm   Problem List / ED Course / Critical interventions / Medication management  Patient presenting for transient chest discomfort this morning in addition to lightheadedness that is worsened with standing.  On arrival in the ED, vital signs notable for moderate hypertension.  Patient is overall well-appearing on exam.  He has  no focal neurologic deficits.  Current breathing is unlabored.  Workup was initiated.  Patient's initial troponin was 31.  Although this is expected with ESRD, prior troponins have been normal.  I spoke with cardiologist on-call, Dr. Lavona, who evaluated the patient at bedside.  Given his recent stress test findings, patient to be admitted for planned LHC.  Patient was admitted to medicine for further management. I ordered medication including ASA for possible ACS, IV fluids for hydration, meclizine  for postural dizziness Reevaluation of the patient after these medicines showed that the patient stayed the same I have reviewed the patients home medicines and have made adjustments as needed   Consultations Obtained:  I requested consultation with the cardiologist, Dr. Lavona,  and discussed lab and imaging findings as well as pertinent plan - they recommend: Admission to medicine   Social Determinants of Health:  Lives independently     Final diagnoses:  Chest pressure    ED Discharge Orders     None          Melvenia Motto, MD 09/25/23 1659

## 2023-09-25 NOTE — ED Triage Notes (Signed)
 Pt c.o chest pain and dizziness for a while, they think I have a blockage

## 2023-09-25 NOTE — H&P (Addendum)
 History and Physical   CHAUNCEY SCIULLI FMW:[id number] DOB: 1960/05/13 DOA: 09/25/2023  PCP: Norleen Agent ORN, MD   Patient coming from: Home  Chief Complaint: Chest pain  HPI: Craig AMESCUA is a 63 y.o. male with medical history significant of hypertension, hyperlipidemia, ESRD, CAD, hypothyroidism, neuropathy, anemia, GERD, chronic diastolic CHF, colon cancer, history of DVT, history of retinal artery thrombosis, OSA, OHS, obesity, pancreatitis, gout presenting with chest pain.  Patient states that he woke up feeling okay but during breakfast he began to develop right-sided chest pain with associated lightheadedness.  Chest pain lasted around an hour and a half and then resolved.  Patient has undergone recent cardiac evaluation with cardiac PET CT on 7/1.  This was a high risk study concerning for multiple vessel CAD with plan for cardiac cath to further evaluate.  Patient denies fevers, chills, shortness of breath, abdominal pain, constipation, diarrhea, nausea, vomiting.  ED Course: Vital signs in the ED notable for blood pressure in the 110s to 180s systolic.  Lab workup included BMP with sodium stable 129, chloride 96, creatinine stable at 7.4.  LFTs with protein 6.3.  Magnesium  normal.  CBC showed hemoglobin stable at 10.6, platelets 135.  Troponin flat at 31 x 2.  Lipase normal.  BNP mildly elevated at 276.  Urinalysis with protein.  Chest x-ray showed no acute abnormality.  Patient received aspirin , meclizine , 250 cc IV fluid in the ED.  Cardiology consulted and plan for catheterization while admitted, catheterization was already planned based on recent PET/CT.  Review of Systems: As per HPI otherwise all other systems reviewed and are negative.  Past Medical History:  Diagnosis Date   Acute pancreatitis 08/22/2020   Acute systolic CHF (congestive heart failure) (HCC) 11/17/2013   Anemia 06/2015   Anemia due to acquired thiamine deficiency 03/25/2020   Arthritis    knee    Bacterial sinusitis 10/16/2019   Cancer (HCC)    thyroid  cancer   Cardiomyopathy (HCC)    a. 10/2013: EF reduced to 35-40% b. 09/2014: EF improved to 55-60%, Grade 1 DD noted.   Chest pain 06/2015   CHF (congestive heart failure) (HCC)    CKD (chronic kidney disease), stage IV [facility])    sees  Dr Betsey   Dyspnea    with exerion    GERD (gastroesophageal reflux disease)    Glaucoma    Gout    Hypertension    Obesity    Pancreatitis    Sleep apnea    not wearing c-pap now, needs new slep study per pt. (01/22/2017)   Tuberculosis 1981   while the pt was in the service.   Tubular adenoma of colon 10/2015    Past Surgical History:  Procedure Laterality Date   AV FISTULA PLACEMENT Left 03/25/2016   Procedure: Creation of Left Arm ARTERIOVENOUS (AV) FISTULA;  Surgeon: Carlin FORBES Haddock, MD;  Location: [facility] OR;  Service: Vascular;  Laterality: Left;   AV FISTULA PLACEMENT Left 08/27/2020   Procedure: LEFT BASILIC VEIN ARTERIOVENOUS (AV) FISTULA CREATION;  Surgeon: Oris Krystal FALCON, MD;  Location: [facility] OR;  Service: Vascular;  Laterality: Left;   BASCILIC VEIN TRANSPOSITION Left 10/22/2020   Procedure: LEFT SECOND STAGE BASILIC VEIN TRANSPOSITION;  Surgeon: Haddock Carlin FORBES, MD;  Location: [facility] OR;  Service: Vascular;  Laterality: Left;   COLONOSCOPY W/ BIOPSIES AND POLYPECTOMY     no problem   HERNIA REPAIR     IR FLUORO GUIDE CV LINE RIGHT  08/22/2020  IR US  GUIDE VASC ACCESS RIGHT  08/22/2020   LAPAROSCOPIC CHOLECYSTECTOMY     LIGATION OF COMPETING BRANCHES OF ARTERIOVENOUS FISTULA Left 01/25/2018   Procedure: LIGATION OF COMPETING BRANCHES And Revision of ARTERIOVENOUS FISTULA LEFT ARM.;  Surgeon: Harvey Carlin BRAVO, MD;  Location: [facility] OR;  Service: Vascular;  Laterality: Left;   THYROIDECTOMY  01/22/2017   THYROIDECTOMY Left 01/22/2017   Procedure: THYROIDECTOMY;  Surgeon: Carlie Clark, MD;  Location: [facility] OR;  Service: ENT;  Laterality: Left;   THYROIDECTOMY Right 03/26/2017   Procedure: COMPLETION  OF THYROIDECTOMY;  Surgeon: Carlie Clark, MD;  Location: [facility] OR;  Service: ENT;  Laterality: Right;   UMBILICAL HERNIA REPAIR     WISDOM TOOTH EXTRACTION      Social History  reports that he quit smoking about 18 months ago. His smoking use included cigarettes. He started smoking about 41 years ago. He has never used smokeless tobacco. He reports that he does not currently use alcohol. He reports that he does not use drugs.  No Known Allergies  Family History  Problem Relation Age of Onset   Diabetes Mother    Stroke Mother    Pneumonia Father        died of Pneumonia x3   Kidney disease Maternal Grandmother    Hypertension Maternal Grandmother    Healthy Maternal Grandfather    Healthy Paternal Grandmother    Healthy Paternal Grandfather    CAD Neg Hx    Colon cancer Neg Hx    Esophageal cancer Neg Hx    Pancreatic cancer Neg Hx    Prostate cancer Neg Hx    Stomach cancer Neg Hx    Rectal cancer Neg Hx   Review  on admission Prior to Admission medications   Medication Sig Start Date End Date Taking? Authorizing Provider  acetaminophen  (TYLENOL ) 500 MG tablet Take 1,000 mg by mouth every 6 (six) hours as needed for moderate pain.    [provider]  albuterol  (VENTOLIN  HFA) 108 (90 Base) MCG/ACT inhaler Inhale 2 puffs into the lungs every 6 (six) hours as needed for wheezing or shortness of breath. 09/02/23   Alvia Corean CROME, FNP  amLODipine  (NORVASC ) 2.5 MG tablet Take 1 tablet (2.5 mg total) by mouth daily. 09/01/23   Lavona Agent, MD  aspirin  EC 81 MG tablet Take 81 mg by mouth daily.    [provider]  carvedilol  (COREG ) 25 MG tablet TAKE 1 TABLET(25 MG) BY MOUTH TWICE DAILY 12/23/21   Norleen Agent ORN, MD  diazepam  (VALIUM ) 5 MG tablet 1 tab by mouth 45 min prior to MRI 05/23/21   Norleen Agent ORN, MD  diclofenac  Sodium (VOLTAREN ) 1 % GEL Apply 4 g topically 4 (four) times daily. 05/05/23   Emil Share, DO  fenofibrate  54 MG tablet TAKE 1 TABLET BY MOUTH  EVERY DAY 08/07/20   Lavona Agent, MD  furosemide  (LASIX ) 80 MG tablet Take 80 mg by mouth daily. 02/02/22   [provider]  gabapentin  (NEURONTIN ) 300 MG capsule TAKE 1 CAPSULE(300 MG) BY MOUTH AT BEDTIME 03/09/23   Norleen Agent ORN, MD  ibuprofen  (ADVIL ) 800 MG tablet Take 1 tablet (800 mg total) by mouth every 8 (eight) hours as needed. 09/15/23   Norleen Agent ORN, MD  isosorbide  mononitrate (IMDUR ) 30 MG 24 hr tablet TAKE 2 TABLETS BY MOUTH DAILY. SCHEDULE OFFICE VISIT FOR FUTURE REFILLS 08/21/22   Norleen Agent ORN, MD  ketoconazole  (NIZORAL ) 2 % cream Apply 1 application topically daily. 02/11/21  Sikora, Rebecca, DPM  levothyroxine  (SYNTHROID ) 125 MCG tablet TAKE 1 TABLET(125 MCG) BY MOUTH EVERY MORNING 05/06/23   Norleen Lynwood ORN, MD  meclizine  (ANTIVERT ) 25 MG tablet Take 1 tablet (25 mg total) by mouth 3 (three) times daily as needed for dizziness. 11/07/21   Norleen Lynwood ORN, MD  meclizine  (ANTIVERT ) 25 MG tablet Take 1 tablet (25 mg total) by mouth 3 (three) times daily as needed for dizziness. 06/17/23   Minnie Tinnie BRAVO, PA  nitroGLYCERIN  (NITROSTAT ) 0.4 MG SL tablet DISSOLVE 1 TABLET UNDER THE TONGUE EVERY 5 MINUTES AS NEEDED FOR CHEST PAIN Patient taking differently: Place 0.4 mg under the tongue every 5 (five) minutes as needed for chest pain. 03/29/19   Jesus Elspeth Sieving, MD  pantoprazole  (PROTONIX ) 40 MG tablet Take 1 tablet (40 mg total) by mouth daily. Patient taking differently: Take 80 mg by mouth daily. 10/21/21   Norleen Lynwood ORN, MD  rosuvastatin  (CRESTOR ) 10 MG tablet Take 1 tablet (10 mg total) by mouth daily. 07/15/23   Norleen Lynwood ORN, MD  thiamine (VITAMIN B-1) 50 MG tablet Take 1 tablet (50 mg total) by mouth daily. 03/25/20   Joshua Debby CROME, MD  tiZANidine  (ZANAFLEX ) 2 MG tablet Take 1 tablet (2 mg total) by mouth daily. 02/20/22   Sherrell Rocky POUR, PA-C    Physical Exam: Vitals:   09/25/23 1218 09/25/23 1229 09/25/23 1630 09/25/23 1708  BP: (!) 157/65  (!) 187/89 (!) 119/100   Pulse: 89  83   Resp: (!) 24  15 19   Temp: 98 F (36.7 C)   98.5 F (36.9 C)  TempSrc:    Oral  SpO2: 99%  100% 98%  Weight:  127 kg    Height:  6' 2 (1.88 m)      Physical Exam Constitutional:      General: He is not in acute distress.    Appearance: Normal appearance.  HENT:     Head: Normocephalic and atraumatic.     Mouth/Throat:     Mouth: Mucous membranes are moist.     Pharynx: Oropharynx is clear.  Eyes:     Extraocular Movements: Extraocular movements intact.     Pupils: Pupils are equal, round, and reactive to light.  Cardiovascular:     Rate and Rhythm: Normal rate and regular rhythm.     Pulses: Normal pulses.     Heart sounds: Normal heart sounds.  Pulmonary:     Effort: Pulmonary effort is normal. No respiratory distress.     Breath sounds: Normal breath sounds.  Abdominal:     General: Bowel sounds are normal. There is no distension.     Palpations: Abdomen is soft.     Tenderness: There is no abdominal tenderness.  Musculoskeletal:        General: No swelling or deformity.  Skin:    General: Skin is warm and dry.  Neurological:     General: No focal deficit present.     Mental Status: Mental status is at baseline.     Labs on Admission: I have personally reviewed following labs and imaging studies  CBC: Recent Labs  Lab 09/25/23 1242  WBC 5.8  HGB 10.6*  HCT 32.6*  MCV 86.9  PLT 135*    Basic Metabolic Panel: Recent Labs  Lab 09/25/23 1242 09/25/23 1415  NA 129*  --   K 3.8  --   CL 86*  --   CO2 29  --   GLUCOSE 98  --  BUN 23  --   CREATININE 7.40*  --   CALCIUM  10.1  --   MG  --  1.7    GFR: Estimated Creatinine Clearance: 14.7 mL/min (A) (by C-G formula based on SCr of 7.4 mg/dL (H)).  Liver Function Tests: Recent Labs  Lab 09/25/23 1415  AST 16  ALT 17  ALKPHOS 54  BILITOT 0.7  PROT 6.3*  ALBUMIN  3.8    Urine analysis:    Component Value Date/Time   COLORURINE STRAW (A) 09/25/2023 1527   APPEARANCEUR  CLEAR 09/25/2023 1527   LABSPEC 1.004 (L) 09/25/2023 1527   PHURINE 8.0 09/25/2023 1527   GLUCOSEU NEGATIVE 09/25/2023 1527   HGBUR NEGATIVE 09/25/2023 1527   HGBUR negative 05/16/2008 0926   BILIRUBINUR NEGATIVE 09/25/2023 1527   KETONESUR NEGATIVE 09/25/2023 1527   PROTEINUR 100 (A) 09/25/2023 1527   UROBILINOGEN 0.2 11/17/2013 0748   NITRITE NEGATIVE 09/25/2023 1527   LEUKOCYTESUR NEGATIVE 09/25/2023 1527    Radiological Exams on Admission: DG Chest 2 View Result Date: 09/25/2023 CLINICAL DATA:  Chest pain with dizziness for a while. EXAM: CHEST - 2 VIEW COMPARISON:  Radiographs 06/17/2023 and 05/05/2023. CT 02/27/2015. Cardiac PET-CT 09/21/2023. FINDINGS: The heart size and mediastinal contours are stable with aortic atherosclerosis. The lungs are clear. There is no pleural effusion or pneumothorax. No acute osseous findings. IMPRESSION: No evidence of acute cardiopulmonary process. Aortic atherosclerosis. Electronically Signed   By: Elsie Perone M.D.   On: 09/25/2023 13:46    EKG: Independently reviewed.  Sinus rhythm at 90 bpm.  Nonspecific T wave changes.  PVCs noted.  Assessment/Plan Principal Problem:   Chest pain, rule out acute myocardial infarction Active Problems:   Gout   HTN (hypertension)   Anemia in chronic kidney disease   OSA (obstructive sleep apnea)   GERD (gastroesophageal reflux disease)   Chronic diastolic HF (heart failure) (HCC)   Obesity, diabetes, and hypertension syndrome (HCC)   CAD (coronary artery disease)   Obesity (BMI 30-39.9)   Postoperative hypothyroidism   Hyperlipidemia LDL goal <130   History of TB (tuberculosis)   Carcinoma in situ of colon   End stage renal disease (HCC)   Neuropathy   CAD Chest pain > Patient presenting with chest pressure which developed while eating breakfast that lasted an hour and a half. > Has had recent cardiac workup including PET/CT which was determined to be high risk study concerning for multivessel  CAD.  Plan was for Outpatient which will be moved up to inpatient with this presentation. > Already seen by cardiology who recommended inpatient catheterization. - Monitor on telemetry - Appreciate cardiology recommendations and assistance - Continue home ASA, rosuvastatin , Imdur   Chronic diastolic CHF ?Combined systolic and diastolic CHF > Last echo was in 2023 with EF 85-60%, G1 DD, normal LV function.  However recent PET CT scan showed evidence of EF 47%. > Volume managed with HD and daily Lasix .  Does continue to make urine. > Last HD session was yesterday.  BNP mildly elevated at 276 in the ED. - Continue home Lasix , Imdur  - Dialysis as indicated while admitted  Hyperlipidemia - Continue rosuvastatin   Hypothyroidism - Continue home Synthroid   Neuropathy - Continue gabapentin   Anemia - Stable at 10.6 - Trend CBC  GERD - Continue home PPI  OSA - Not using CPAP recently - Retrial of CPAP  Obesity OHS - Noted  History of colon cancer History of retinal arterial thrombosis History of TB - Noted  DVT prophylaxis: Heparin  Code  Status:   Full Family Communication:  None on admission  Disposition Plan:   Patient is from:  Home  Anticipated DC to:  Home  Anticipated DC date:  2 to 5 days  Anticipated DC barriers: None  Consults called:  Cardiology, nephrology Admission status:  Inpatient, telemetry  Severity of Illness: The appropriate patient status for this patient is INPATIENT. Inpatient status is judged to be reasonable and necessary in order to provide the required intensity of service to ensure the patient's safety. The patient's presenting symptoms, physical exam findings, and initial radiographic and laboratory data in the context of their chronic comorbidities is felt to place them at high risk for further clinical deterioration. Furthermore, it is not anticipated that the patient will be medically stable for discharge from the hospital within 2 midnights of  admission.   * I certify that at the point of admission it is my clinical judgment that the patient will require inpatient hospital care spanning beyond 2 midnights from the point of admission due to high intensity of service, high risk for further deterioration and high frequency of surveillance required.DEWAINE Marsa KATHEE Seena MD [facility]  How to contact the [facility] Attending or Consulting provider 7A - 7P or covering provider during after hours 7P -7A, for this patient?   Check the care team in [facility] and look for a) attending/consulting [facility] provider listed and b) the [facility] team listed Log into [email or web address] and use [place]'s universal password to access. If you do not have the password, please contact the hospital operator. Locate the [facility] provider you are looking for under [facility] and page to a number that you can be directly reached. If you still have difficulty reaching the provider, please page the [facility] (Director on Call) for the Hospitalists listed on amion for assistance.  09/25/2023, 6:41 PM

## 2023-09-25 NOTE — Consult Note (Signed)
 Cardiology Consultation:   Patient ID: Craig Flynn; [id number]; 01/04/1961   Admit date: 09/25/2023 Date of Consult: 09/25/2023  Primary Care Provider: Norleen Agent ORN, MD Primary Cardiologist: Agent Schilling, MD  Primary Electrophysiologist:  None   Patient Profile:   Craig Flynn is a 63 y.o. male with a hx of cardiomyopathy who is being seen today for the evaluation of chest pain at the request of Dr. Melvenia.  History of Present Illness:   Craig Flynn  who presents for evaluation of cardiomyopathy and hypertension. The patient has long-standing hypertension. He has had a mildly reduced ejection fraction of about 45%. In July 2016 his EF was said to be about 55%.    Perfusion  study in 2017 demonstrated EF of 47% with a fixed inferior wall defect.   I sent him for a POET (Plain Old Exercise Treadmill) in 2021 which was negative but he had a suboptimal heart rate response.    Echo in April of 2021 demonstrated an EF of 60 to 65%.  He was in the emergency room in March 2025 with chest pain.    I saw him earlier this month and he had chest discomfort.  He had a perfusion study with suggestion of multivessel coronary disease.  The EF said to be 47%.  He was being scheduled for elective cardiac catheterization.  He said that he was sitting today eating and he had some discomfort.  This was a sharp discomfort right of the sternum.  There was no radiation to his jaw or to his arms.  He did not have any nausea vomiting or diaphoresis.  He did not have any shortness of breath, PND or orthopnea.  He felt lightheaded.  He had his son drive him.  He says this is similar to what he had been having.  Troponin was very minimally elevated in the ER.  EKG demonstrated nonspecific changes with some premature ventricular contractions.     Past Medical History:  Diagnosis Date   Anemia 06/2015   Arthritis    knee   Cancer (HCC)    thyroid  cancer   Cardiomyopathy (HCC)    a. 10/2013: EF reduced to 35-40%  b. 09/2014: EF improved to 55-60%, Grade 1 DD noted.   Chest pain 06/2015   CHF (congestive heart failure) (HCC)    CKD (chronic kidney disease), stage IV [facility])    sees  Dr Betsey   Dyspnea    with exerion    GERD (gastroesophageal reflux disease)    Glaucoma    Gout    Hypertension    Obesity    Pancreatitis    Sleep apnea    not wearing c-pap now, needs new slep study per pt. (01/22/2017)   Tuberculosis 1981   while the pt was in the service.   Tubular adenoma of colon 10/2015    Past Surgical History:  Procedure Laterality Date   AV FISTULA PLACEMENT Left 03/25/2016   Procedure: Creation of Left Arm ARTERIOVENOUS (AV) FISTULA;  Surgeon: Carlin FORBES Haddock, MD;  Location: [facility] OR;  Service: Vascular;  Laterality: Left;   AV FISTULA PLACEMENT Left 08/27/2020   Procedure: LEFT BASILIC VEIN ARTERIOVENOUS (AV) FISTULA CREATION;  Surgeon: Oris Krystal FALCON, MD;  Location: [facility] OR;  Service: Vascular;  Laterality: Left;   BASCILIC VEIN TRANSPOSITION Left 10/22/2020   Procedure: LEFT SECOND STAGE BASILIC VEIN TRANSPOSITION;  Surgeon: Haddock Carlin FORBES, MD;  Location: [facility] OR;  Service: Vascular;  Laterality: Left;  COLONOSCOPY W/ BIOPSIES AND POLYPECTOMY     no problem   HERNIA REPAIR     IR FLUORO GUIDE CV LINE RIGHT  08/22/2020   IR US  GUIDE VASC ACCESS RIGHT  08/22/2020   LAPAROSCOPIC CHOLECYSTECTOMY     LIGATION OF COMPETING BRANCHES OF ARTERIOVENOUS FISTULA Left 01/25/2018   Procedure: LIGATION OF COMPETING BRANCHES And Revision of ARTERIOVENOUS FISTULA LEFT ARM.;  Surgeon: Harvey Carlin BRAVO, MD;  Location: [facility] OR;  Service: Vascular;  Laterality: Left;   THYROIDECTOMY  01/22/2017   THYROIDECTOMY Left 01/22/2017   Procedure: THYROIDECTOMY;  Surgeon: Carlie Clark, MD;  Location: [facility] OR;  Service: ENT;  Laterality: Left;   THYROIDECTOMY Right 03/26/2017   Procedure: COMPLETION OF THYROIDECTOMY;  Surgeon: Carlie Clark, MD;  Location: [facility] OR;  Service: ENT;  Laterality: Right;   UMBILICAL HERNIA REPAIR      WISDOM TOOTH EXTRACTION       Home Medications:  Prior to Admission medications   Medication Sig Start Date End Date Taking? Authorizing Provider  acetaminophen  (TYLENOL ) 500 MG tablet Take 1,000 mg by mouth every 6 (six) hours as needed for moderate pain.    [provider]  albuterol  (VENTOLIN  HFA) 108 (90 Base) MCG/ACT inhaler Inhale 2 puffs into the lungs every 6 (six) hours as needed for wheezing or shortness of breath. 09/02/23   Alvia Corean CROME, FNP  amLODipine  (NORVASC ) 2.5 MG tablet Take 1 tablet (2.5 mg total) by mouth daily. 09/01/23   Lavona Agent, MD  aspirin  EC 81 MG tablet Take 81 mg by mouth daily.    [provider]  carvedilol  (COREG ) 25 MG tablet TAKE 1 TABLET(25 MG) BY MOUTH TWICE DAILY 12/23/21   Norleen Agent ORN, MD  diazepam  (VALIUM ) 5 MG tablet 1 tab by mouth 45 min prior to MRI 05/23/21   Norleen Agent ORN, MD  diclofenac  Sodium (VOLTAREN ) 1 % GEL Apply 4 g topically 4 (four) times daily. 05/05/23   Emil Share, DO  fenofibrate  54 MG tablet TAKE 1 TABLET BY MOUTH EVERY DAY 08/07/20   Lavona Agent, MD  furosemide  (LASIX ) 80 MG tablet Take 80 mg by mouth daily. 02/02/22   [provider]  gabapentin  (NEURONTIN ) 300 MG capsule TAKE 1 CAPSULE(300 MG) BY MOUTH AT BEDTIME 03/09/23   Norleen Agent ORN, MD  ibuprofen  (ADVIL ) 800 MG tablet Take 1 tablet (800 mg total) by mouth every 8 (eight) hours as needed. 09/15/23   Norleen Agent ORN, MD  isosorbide  mononitrate (IMDUR ) 30 MG 24 hr tablet TAKE 2 TABLETS BY MOUTH DAILY. SCHEDULE OFFICE VISIT FOR FUTURE REFILLS 08/21/22   Norleen Agent ORN, MD  ketoconazole  (NIZORAL ) 2 % cream Apply 1 application topically daily. 02/11/21   Sikora, Rebecca, DPM  levothyroxine  (SYNTHROID ) 125 MCG tablet TAKE 1 TABLET(125 MCG) BY MOUTH EVERY MORNING 05/06/23   Norleen Agent ORN, MD  meclizine  (ANTIVERT ) 25 MG tablet Take 1 tablet (25 mg total) by mouth 3 (three) times daily as needed for dizziness. 11/07/21   Norleen Agent ORN, MD  meclizine   (ANTIVERT ) 25 MG tablet Take 1 tablet (25 mg total) by mouth 3 (three) times daily as needed for dizziness. 06/17/23   Minnie Tinnie BRAVO, PA  nitroGLYCERIN  (NITROSTAT ) 0.4 MG SL tablet DISSOLVE 1 TABLET UNDER THE TONGUE EVERY 5 MINUTES AS NEEDED FOR CHEST PAIN Patient taking differently: Place 0.4 mg under the tongue every 5 (five) minutes as needed for chest pain. 03/29/19   Jesus Elspeth Sieving, MD  pantoprazole  (PROTONIX ) 40 MG tablet  Take 1 tablet (40 mg total) by mouth daily. Patient taking differently: Take 80 mg by mouth daily. 10/21/21   Norleen Lynwood ORN, MD  rosuvastatin  (CRESTOR ) 10 MG tablet Take 1 tablet (10 mg total) by mouth daily. 07/15/23   Norleen Lynwood ORN, MD  thiamine (VITAMIN B-1) 50 MG tablet Take 1 tablet (50 mg total) by mouth daily. 03/25/20   Joshua Debby CROME, MD  tiZANidine  (ZANAFLEX ) 2 MG tablet Take 1 tablet (2 mg total) by mouth daily. 02/20/22   Raspet, Rocky POUR, PA-C    Inpatient Medications: Scheduled Meds:  Continuous Infusions:  PRN Meds:   Allergies:   No Known Allergies  Social History:   Social History   Socioeconomic History   Marital status: Divorced    Spouse name: Not on file   Number of children: 2   Years of education: 12   Highest education level: Not on file  Occupational History   Occupation: Disability    Comment: Knees   Tobacco Use   Smoking status: Former    Current packs/day: 0.00    Types: Cigarettes    Start date: 03/16/1982    Quit date: 03/16/2022    Years since quitting: 1.5   Smokeless tobacco: Never   Tobacco comments:    1/2 pack per day (03/25/2021)    Patient stated he quit smoking on 03/16/2022  Vaping Use   Vaping status: Never Used  Substance and Sexual Activity   Alcohol use: Not Currently    Alcohol/week: 0.0 standard drinks of alcohol   Drug use: No   Sexual activity: Yes  Other Topics Concern   Not on file  Social History Narrative   Fun: Golf, basketball    Denies religious beliefs effecting health care.        Right Handed    Drinks no caffeine    Lives in a one story home    Social Drivers of Health   Financial Resource Strain: Low Risk  (07/06/2023)   Overall Financial Resource Strain (CARDIA)    Difficulty of Paying Living Expenses: Not hard at all  Food Insecurity: No Food Insecurity (07/06/2023)   Hunger Vital Sign    Worried About Running Out of Food in the Last Year: Never true    Ran Out of Food in the Last Year: Never true  Transportation Needs: No Transportation Needs (07/06/2023)   PRAPARE - Administrator, Civil Service (Medical): No    Lack of Transportation (Non-Medical): No  Physical Activity: Sufficiently Active (07/06/2023)   Exercise Vital Sign    Days of Exercise per Week: 6 days    Minutes of Exercise per Session: 30 min  Stress: No Stress Concern Present (07/06/2023)   [employer] of Occupational Health - Occupational Stress Questionnaire    Feeling of Stress : Not at all  Social Connections: Moderately Isolated (07/06/2023)   Social Connection and Isolation Panel    Frequency of Communication with Friends and Family: More than three times a week    Frequency of Social Gatherings with Friends and Family: Twice a week    Attends Religious Services: More than 4 times per year    Active Member of [employer] or Organizations: No    Attends Banker Meetings: Never    Marital Status: Widowed  Intimate Partner Violence: Patient Unable To Answer (07/06/2023)   Humiliation, Afraid, Rape, and Kick questionnaire    Fear of Current or Ex-Partner: Patient unable to answer    Emotionally  Abused: Patient unable to answer    Physically Abused: Patient unable to answer    Sexually Abused: Patient unable to answer    Family History:    Family History  Problem Relation Age of Onset   Diabetes Mother    Stroke Mother    Pneumonia Father        died of Pneumonia x3   Kidney disease Maternal Grandmother    Hypertension Maternal Grandmother    Healthy  Maternal Grandfather    Healthy Paternal Grandmother    Healthy Paternal Grandfather    CAD Neg Hx    Colon cancer Neg Hx    Esophageal cancer Neg Hx    Pancreatic cancer Neg Hx    Prostate cancer Neg Hx    Stomach cancer Neg Hx    Rectal cancer Neg Hx      ROS:  Please see the history of present illness.  As stated in the HPI and negative for all other systems. All other ROS reviewed and negative.     Physical Exam/Data:   Vitals:   09/25/23 1218 09/25/23 1229  BP: (!) 157/65   Pulse: 89   Resp: (!) 24   Temp: 98 F (36.7 C)   SpO2: 99%   Weight:  127 kg  Height:  6' 2 (1.88 m)   No intake or output data in the 24 hours ending 09/25/23 1623 Filed Weights   09/25/23 1229  Weight: 127 kg   Body mass index is 35.95 kg/m.  GENERAL:  Well appearing HEENT:   Pupils equal round and reactive, fundi not visualized, oral mucosa unremarkable NECK:  No  jugular venous distention, waveform within normal limits, carotid upstroke brisk and symmetric, no bruits, no thyromegaly LYMPHATICS:  No cervical, inguinal adenopathy LUNGS:   Clear to auscultation bilaterally BACK:  No CVA tenderness CHEST:   Unremarkable HEART:  PMI not displaced or sustained,S1 and S2 within normal limits, no S3, no S4, no clicks, no rubs, 2 out of 6 apical systolic murmur radiating slightly at the aortic outflow tract, no diastolic murmurs ABD:  Flat, positive bowel sounds normal in frequency in pitch, no bruits, no rebound, no guarding, no midline pulsatile mass, no hepatomegaly, no splenomegaly EXT:  2 plus pulses throughout, no  edema, no cyanosis no clubbing, left upper arm thrill and bruit over AV fistula SKIN:  No rashes no nodules NEURO:   Cranial nerves II through XII grossly intact, motor grossly intact throughout PSYCH:    Cognitively intact, oriented to person place and time   EKG:  The EKG was personally reviewed and demonstrates: Sinus rhythm, ventricular ectopy with 3 beats of nonsustained  V. tach, poor anterior R wave progression, borderline interventricular conduction delay, no acute ST-T wave changes  Telemetry:  Telemetry was personally reviewed and demonstrates:  NA  Relevant CV Studies: None  Laboratory Data:  Chemistry Recent Labs  Lab 09/25/23 1242  NA 129*  K 3.8  CL 86*  CO2 29  GLUCOSE 98  BUN 23  CREATININE 7.40*  CALCIUM  10.1  GFRNONAA 8*  ANIONGAP 14    Recent Labs  Lab 09/25/23 1415  PROT 6.3*  ALBUMIN  3.8  AST 16  ALT 17  ALKPHOS 54  BILITOT 0.7   Hematology Recent Labs  Lab 09/25/23 1242  WBC 5.8  RBC 3.75*  HGB 10.6*  HCT 32.6*  MCV 86.9  MCH 28.3  MCHC 32.5  RDW 13.6  PLT 135*   Cardiac EnzymesNo results for input(s):  TROPONINI in the last 168 hours. No results for input(s): TROPIPOC in the last 168 hours.  BNP Recent Labs  Lab 09/25/23 1415  BNP 276.6*    DDimer No results for input(s): DDIMER in the last 168 hours.  Radiology/Studies:  DG Chest 2 View Result Date: 09/25/2023 CLINICAL DATA:  Chest pain with dizziness for a while. EXAM: CHEST - 2 VIEW COMPARISON:  Radiographs 06/17/2023 and 05/05/2023. CT 02/27/2015. Cardiac PET-CT 09/21/2023. FINDINGS: The heart size and mediastinal contours are stable with aortic atherosclerosis. The lungs are clear. There is no pleural effusion or pneumothorax. No acute osseous findings. IMPRESSION: No evidence of acute cardiopulmonary process. Aortic atherosclerosis. Electronically Signed   By: Elsie Perone M.D.   On: 09/25/2023 13:46    Assessment and Plan:   CARDIOMYOPATHY:    Volume is managed per dialysis.  He will have repeat echo this admission.  BNP is mildly elevated.  No acute findings on chest x-ray.  HTN: Interestingly he been taking midodrine  but his blood pressure was elevated.  Actually started amlodipine  at the last visit.  Continue amlodipine  on admission.  BP elevated in ED and will follow up with med titration.    CKD:   Plan per nephrology.  He has  previously been tolerating dialysis.    CHEST PAIN:    Given this and the abnormal PET cardiac catheterization is indicated.  I had discussed this previously with him and we have reviewed risk.  The patient understands that risks included but are not limited to stroke (1 in 1000), death (1 in 1000), kidney failure [usually temporary] (1 in 500), bleeding (1 in 200), allergic reaction [possibly serious] (1 in 200).  The patient understands and agrees to proceed.   Continue aspirin , continue low-dose beta-blocker.  Continue Imdur .  Also continue risk reduction with statin.   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  Free water  restrict.  Otherwise per nephrology.      For questions or updates, please contact [facility] Please consult [email or web address] for contact info under Cardiology/STEMI.   Signed, Lynwood Schilling, MD  09/25/2023 4:23 PM

## 2023-09-25 NOTE — ED Notes (Signed)
 CCMD contacted to admit the patient for cardiac monitoring.

## 2023-09-26 ENCOUNTER — Other Ambulatory Visit: Payer: Self-pay | Admitting: Internal Medicine

## 2023-09-26 DIAGNOSIS — R079 Chest pain, unspecified: Secondary | ICD-10-CM | POA: Diagnosis not present

## 2023-09-26 LAB — LIPID PANEL
Cholesterol: 105 mg/dL (ref 0–200)
HDL: 32 mg/dL — ABNORMAL LOW (ref >40)
LDL Cholesterol: 55 mg/dL (ref 0–99)
Total CHOL/HDL Ratio: 3.3 ratio
Triglycerides: 91 mg/dL (ref <150)
VLDL: 18 mg/dL (ref 0–40)

## 2023-09-26 LAB — COMPREHENSIVE METABOLIC PANEL WITH GFR
ALT: 15 U/L (ref 0–44)
AST: 13 U/L — ABNORMAL LOW (ref 15–41)
Albumin: 3.4 g/dL — ABNORMAL LOW (ref 3.5–5.0)
Alkaline Phosphatase: 41 U/L (ref 38–126)
Anion gap: 13 (ref 5–15)
BUN: 28 mg/dL — ABNORMAL HIGH (ref 8–23)
CO2: 25 mmol/L (ref 22–32)
Calcium: 9.5 mg/dL (ref 8.9–10.3)
Chloride: 90 mmol/L — ABNORMAL LOW (ref 98–111)
Creatinine, Ser: 8.05 mg/dL — ABNORMAL HIGH (ref 0.61–1.24)
GFR, Estimated: 7 mL/min — ABNORMAL LOW (ref >60)
Glucose, Bld: 84 mg/dL (ref 70–99)
Potassium: 4.1 mmol/L (ref 3.5–5.1)
Sodium: 128 mmol/L — ABNORMAL LOW (ref 135–145)
Total Bilirubin: 0.7 mg/dL (ref 0.0–1.2)
Total Protein: 5.6 g/dL — ABNORMAL LOW (ref 6.5–8.1)

## 2023-09-26 LAB — HEPATITIS B SURFACE ANTIGEN: Hepatitis B Surface Ag: NONREACTIVE

## 2023-09-26 LAB — HEMOGLOBIN A1C
Hgb A1c MFr Bld: 5.5 % (ref 4.8–5.6)
Mean Plasma Glucose: 111.15 mg/dL

## 2023-09-26 LAB — CBC
HCT: 31.3 % — ABNORMAL LOW (ref 39.0–52.0)
Hemoglobin: 10.5 g/dL — ABNORMAL LOW (ref 13.0–17.0)
MCH: 28.8 pg (ref 26.0–34.0)
MCHC: 33.5 g/dL (ref 30.0–36.0)
MCV: 86 fL (ref 80.0–100.0)
Platelets: 128 K/uL — ABNORMAL LOW (ref 150–400)
RBC: 3.64 MIL/uL — ABNORMAL LOW (ref 4.22–5.81)
RDW: 13.6 % (ref 11.5–15.5)
WBC: 5.1 K/uL (ref 4.0–10.5)
nRBC: 0 % (ref 0.0–0.2)

## 2023-09-26 MED ORDER — HYDRALAZINE HCL 20 MG/ML IJ SOLN
10.0000 mg | INTRAMUSCULAR | Status: DC | PRN
  Administered 2023-09-26: 10 mg via INTRAVENOUS
  Filled 2023-09-26: qty 1

## 2023-09-26 MED ORDER — SEVELAMER CARBONATE 800 MG PO TABS
800.0000 mg | ORAL_TABLET | Freq: Three times a day (TID) | ORAL | Status: DC
  Administered 2023-09-26 – 2023-09-28 (×3): 800 mg via ORAL
  Filled 2023-09-26 (×3): qty 1

## 2023-09-26 MED ORDER — METOPROLOL TARTRATE 5 MG/5ML IV SOLN: 5.0000 mg | INTRAVENOUS | Status: DC | PRN

## 2023-09-26 MED ORDER — CALCITRIOL 0.5 MCG PO CAPS
4.0000 ug | ORAL_CAPSULE | ORAL | Status: DC
  Administered 2023-09-27: 4 ug via ORAL
  Filled 2023-09-26: qty 16

## 2023-09-26 MED ORDER — IPRATROPIUM-ALBUTEROL 0.5-2.5 (3) MG/3ML IN SOLN: 3.0000 mL | RESPIRATORY_TRACT | Status: DC | PRN

## 2023-09-26 MED ORDER — AMLODIPINE BESYLATE 5 MG PO TABS
5.0000 mg | ORAL_TABLET | Freq: Every day | ORAL | Status: DC
  Administered 2023-09-27 – 2023-09-28 (×2): 5 mg via ORAL
  Filled 2023-09-26 (×3): qty 1

## 2023-09-26 MED ORDER — GLUCAGON HCL RDNA (DIAGNOSTIC) 1 MG IJ SOLR: 1.0000 mg | INTRAMUSCULAR | Status: DC | PRN

## 2023-09-26 MED ORDER — CINACALCET HCL 30 MG PO TABS
90.0000 mg | ORAL_TABLET | Freq: Every day | ORAL | Status: DC
  Administered 2023-09-26 – 2023-09-27 (×2): 90 mg via ORAL
  Filled 2023-09-26 (×3): qty 3

## 2023-09-26 NOTE — Consult Note (Addendum)
 [facility] Progress Note   [facility] Renal Consultation Note    Indication for Consultation:  Management of ESRD/hemodialysis; anemia, hypertension/volume and secondary hyperparathyroidism PCP: Dr. Lynwood Rush Nephrologist: Dr. Marlee  HPI: Craig Flynn is a 63 y.o. male with ESRD on hemodialysis MWF at [facility]. Last HD 09/24/2023 stayed 3 hour 3 minutes of 3:45 hr treatment, he left 0.9 kg above OP EDW.  PMH: HTN, CAD, hypothyroidism, HFpEF, DVT, colon cancer, pancreatitis, gout, obesity, SHPT, Anemia of ESRD.  He tells me that he developed chest pain while eating breakfast yesterday. He says gesticulates to R chest. He denied SOB, N, V, diaphoresis. He presented to ED for evaluation. Troponin low, flat trend. Na 129 K+ 3.8 SCr 7.4 BUN 23 Co2 29. CXR unremarkable. He has been seen by cardiology whom he had been following as OP and had been scheduled for OP cardiac cath after perfusion study suggestive of multivessel disease. He is currently pain free. He is scheduled for cardiac catheterization tomorrow.    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 10/2013: EF reduced to 35-40% b. 09/2014: EF improved to 55-60%, Grade 1 DD noted.   Chest pain 06/2015   CHF (congestive heart failure) (HCC)    CKD (chronic kidney disease), stage IV [facility])    sees  Dr Betsey   Dyspnea    with exerion    GERD (gastroesophageal reflux disease)    Glaucoma    Gout    Hypertension    Obesity    Pancreatitis    Sleep apnea    not wearing c-pap now, needs new slep study per pt. (01/22/2017)   Tuberculosis 1981   while the pt was in the service.   Tubular adenoma of colon  10/2015   Past Surgical History:  Procedure Laterality Date   AV FISTULA PLACEMENT Left 03/25/2016   Procedure: Creation of Left Arm ARTERIOVENOUS (AV) FISTULA;  Surgeon: Carlin FORBES Haddock, MD;  Location: [facility] OR;  Service: Vascular;  Laterality: Left;   AV FISTULA PLACEMENT Left 08/27/2020   Procedure: LEFT BASILIC VEIN ARTERIOVENOUS (AV) FISTULA CREATION;  Surgeon: Oris Krystal FALCON, MD;  Location: [facility] OR;  Service: Vascular;  Laterality: Left;   BASCILIC VEIN TRANSPOSITION Left 10/22/2020   Procedure: LEFT SECOND STAGE BASILIC VEIN TRANSPOSITION;  Surgeon: Haddock Carlin FORBES, MD;  Location: [facility] OR;  Service: Vascular;  Laterality: Left;   COLONOSCOPY W/ BIOPSIES AND POLYPECTOMY     no problem   HERNIA REPAIR     IR FLUORO GUIDE CV LINE RIGHT  08/22/2020   IR US  GUIDE VASC ACCESS RIGHT  08/22/2020   LAPAROSCOPIC CHOLECYSTECTOMY     LIGATION OF COMPETING BRANCHES OF ARTERIOVENOUS FISTULA Left 01/25/2018   Procedure: LIGATION OF COMPETING BRANCHES And Revision of ARTERIOVENOUS FISTULA LEFT ARM.;  Surgeon: Haddock Carlin FORBES, MD;  Location: [facility] OR;  Service: Vascular;  Laterality: Left;   THYROIDECTOMY  01/22/2017   THYROIDECTOMY Left 01/22/2017   Procedure: THYROIDECTOMY;  Surgeon: Carlie Clark, MD;  Location: [facility] OR;  Service: ENT;  Laterality: Left;   THYROIDECTOMY Right 03/26/2017   Procedure: COMPLETION OF THYROIDECTOMY;  Surgeon: Carlie Clark, MD;  Location: [facility] OR;  Service: ENT;  Laterality: Right;   UMBILICAL HERNIA REPAIR     WISDOM TOOTH EXTRACTION     Family History  Problem Relation Age of Onset   Diabetes Mother    Stroke Mother    Pneumonia Father        died of Pneumonia x3   Kidney disease Maternal Grandmother    Hypertension Maternal Grandmother    Healthy Maternal Grandfather    Healthy Paternal Grandmother    Healthy Paternal Grandfather    CAD Neg Hx    Colon cancer Neg Hx    Esophageal cancer Neg Hx    Pancreatic cancer Neg Hx    Prostate cancer Neg Hx    Stomach cancer Neg Hx     Rectal cancer Neg Hx    Social History:  reports that he quit smoking about 18 months ago. His smoking use included cigarettes. He started smoking about 41 years ago. He has never used smokeless tobacco. He reports that he does not currently use alcohol. He reports that he does not use drugs. No Known Allergies Prior to Admission medications   Medication Sig Start Date End Date Taking? Authorizing Provider  acetaminophen  (TYLENOL ) 500 MG tablet Take 1,000 mg by mouth every 6 (six) hours as needed for moderate pain.    [provider]  albuterol  (VENTOLIN  HFA) 108 (90 Base) MCG/ACT inhaler Inhale 2 puffs into the lungs every 6 (six) hours as needed for wheezing or shortness of breath. 09/02/23   Alvia Corean CROME, FNP  amLODipine  (NORVASC ) 2.5 MG tablet Take 1 tablet (2.5 mg total) by mouth daily. 09/01/23   Lavona Agent, MD  aspirin  EC 81 MG tablet Take 81 mg by mouth daily.    [provider]  carvedilol  (COREG ) 25 MG tablet TAKE 1 TABLET(25 MG) BY MOUTH TWICE DAILY 12/23/21   Norleen Agent ORN, MD  diazepam  (VALIUM ) 5 MG tablet 1 tab by mouth 45 min prior to MRI 05/23/21   Norleen Agent ORN, MD  diclofenac  Sodium (VOLTAREN ) 1 % GEL Apply 4 g topically 4 (four) times daily. 05/05/23   Floyd, Dan, DO  fenofibrate  54 MG tablet TAKE 1 TABLET BY MOUTH EVERY DAY 08/07/20   Lavona Agent, MD  furosemide  (LASIX ) 80 MG tablet Take 80 mg by mouth daily. 02/02/22   [provider]  gabapentin  (NEURONTIN ) 100 MG capsule Take 100 mg by mouth 3 (three) times daily. 09/17/23   [provider]  gabapentin  (NEURONTIN ) 300 MG capsule TAKE 1 CAPSULE(300 MG) BY MOUTH AT BEDTIME 03/09/23   Norleen Agent ORN, MD  ibuprofen  (ADVIL ) 800 MG tablet Take 1 tablet (800 mg total) by mouth every 8 (eight) hours as needed. 09/15/23   Norleen Agent ORN, MD  isosorbide  mononitrate (IMDUR ) 30 MG 24 hr tablet TAKE 2 TABLETS BY MOUTH DAILY. SCHEDULE OFFICE VISIT FOR FUTURE REFILLS 08/21/22   Norleen Agent ORN, MD   ketoconazole  (NIZORAL ) 2 % cream Apply 1 application topically daily. 02/11/21   Sikora, Rebecca, DPM  levothyroxine  (SYNTHROID ) 125 MCG tablet TAKE 1 TABLET(125 MCG) BY MOUTH EVERY MORNING 05/06/23   Norleen Agent ORN, MD  meclizine  (ANTIVERT ) 25 MG tablet Take 1 tablet (25 mg total) by mouth 3 (three) times daily as needed for dizziness. 11/07/21   Norleen Agent ORN, MD  meclizine  (ANTIVERT ) 25 MG tablet Take 1 tablet (25 mg total) by mouth 3 (three) times daily as needed for dizziness. 06/17/23   Minnie Tinnie BRAVO, PA  nitroGLYCERIN  (  NITROSTAT ) 0.4 MG SL tablet DISSOLVE 1 TABLET UNDER THE TONGUE EVERY 5 MINUTES AS NEEDED FOR CHEST PAIN Patient taking differently: Place 0.4 mg under the tongue every 5 (five) minutes as needed for chest pain. 03/29/19   Jesus Elspeth Sieving, MD  pantoprazole  (PROTONIX ) 40 MG tablet Take 1 tablet (40 mg total) by mouth daily. Patient taking differently: Take 80 mg by mouth daily. 10/21/21   Norleen Lynwood ORN, MD  rosuvastatin  (CRESTOR ) 10 MG tablet Take 1 tablet (10 mg total) by mouth daily. 07/15/23   Norleen Lynwood ORN, MD  SENSIPAR  90 MG tablet Take 90 mg by mouth daily. 09/21/23   [provider]  thiamine (VITAMIN B-1) 50 MG tablet Take 1 tablet (50 mg total) by mouth daily. 03/25/20   Joshua Debby CROME, MD  tiZANidine  (ZANAFLEX ) 2 MG tablet Take 1 tablet (2 mg total) by mouth daily. 02/20/22   Raspet, Erin K, PA-C   Current Facility-Administered Medications  Medication Dose Route Frequency Provider Last Rate Last Admin   acetaminophen  (TYLENOL ) tablet 650 mg  650 mg Oral Q6H PRN Seena Marsa NOVAK, MD       Or   acetaminophen  (TYLENOL ) suppository 650 mg  650 mg Rectal Q6H PRN Seena Marsa NOVAK, MD       [START ON 09/27/2023] amLODipine  (NORVASC ) tablet 5 mg  5 mg Oral Daily Lavona Lynwood, MD       aspirin  EC tablet 81 mg  81 mg Oral Daily Melvin, Alexander B, MD   81 mg at 09/26/23 9094   furosemide  (LASIX ) tablet 80 mg  80 mg Oral Daily Melvin, Alexander B, MD   80 mg at  09/26/23 9094   gabapentin  (NEURONTIN ) capsule 300 mg  300 mg Oral QHS Melvin, Alexander B, MD   300 mg at 09/25/23 2152   glucagon  (human recombinant) (GLUCAGEN) injection 1 mg  1 mg Intravenous PRN Amin, Ankit C, MD       heparin  injection 5,000 Units  5,000 Units Subcutaneous Q8H Melvin, Alexander B, MD   5,000 Units at 09/26/23 0542   hydrALAZINE  (APRESOLINE ) injection 10 mg  10 mg Intravenous Q4H PRN Amin, Ankit C, MD       ipratropium-albuterol  (DUONEB) 0.5-2.5 (3) MG/3ML nebulizer solution 3 mL  3 mL Nebulization Q4H PRN Amin, Ankit C, MD       isosorbide  mononitrate (IMDUR ) 24 hr tablet 30 mg  30 mg Oral Daily Melvin, Alexander B, MD   30 mg at 09/26/23 9094   levothyroxine  (SYNTHROID ) tablet 125 mcg  125 mcg Oral Q0600 Melvin, Alexander B, MD   125 mcg at 09/26/23 0541   metoprolol  tartrate (LOPRESSOR ) injection 5 mg  5 mg Intravenous Q4H PRN Amin, Ankit C, MD       pantoprazole  (PROTONIX ) EC tablet 40 mg  40 mg Oral Daily Melvin, Alexander B, MD   40 mg at 09/26/23 9094   polyethylene glycol (MIRALAX  / GLYCOLAX ) packet 17 g  17 g Oral Daily PRN Melvin, Alexander B, MD       rosuvastatin  (CRESTOR ) tablet 10 mg  10 mg Oral Daily Melvin, Alexander B, MD   10 mg at 09/26/23 9094   sodium chloride  flush (NS) 0.9 % injection 3 mL  3 mL Intravenous Q12H Seena Marsa NOVAK, MD   3 mL at 09/25/23 2152   Labs: Basic Metabolic Panel: Recent Labs  Lab 09/25/23 1242 09/26/23 0414  NA 129* 128*  K 3.8 4.1  CL 86* 90*  CO2 29 25  GLUCOSE 98 84  BUN 23 28*  CREATININE 7.40* 8.05*  CALCIUM  10.1 9.5   Liver Function Tests: Recent Labs  Lab 09/25/23 1415 09/26/23 0414  AST 16 13*  ALT 17 15  ALKPHOS 54 41  BILITOT 0.7 0.7  PROT 6.3* 5.6*  ALBUMIN  3.8 3.4*   Recent Labs  Lab 09/25/23 1415  LIPASE 38   No results for input(s): AMMONIA in the last 168 hours. CBC: Recent Labs  Lab 09/25/23 1242 09/26/23 0414  WBC 5.8 5.1  HGB 10.6* 10.5*  HCT 32.6* 31.3*  MCV 86.9 86.0   PLT 135* 128*   Cardiac Enzymes: No results for input(s): CKTOTAL, CKMB, CKMBINDEX, TROPONINI in the last 168 hours. CBG: No results for input(s): GLUCAP in the last 168 hours. Iron  Studies: No results for input(s): IRON , TIBC, TRANSFERRIN, FERRITIN in the last 72 hours. Studies/Results: DG Chest 2 View Result Date: 09/25/2023 CLINICAL DATA:  Chest pain with dizziness for a while. EXAM: CHEST - 2 VIEW COMPARISON:  Radiographs 06/17/2023 and 05/05/2023. CT 02/27/2015. Cardiac PET-CT 09/21/2023. FINDINGS: The heart size and mediastinal contours are stable with aortic atherosclerosis. The lungs are clear. There is no pleural effusion or pneumothorax. No acute osseous findings. IMPRESSION: No evidence of acute cardiopulmonary process. Aortic atherosclerosis. Electronically Signed   By: Elsie Perone M.D.   On: 09/25/2023 13:46    ROS: As per HPI otherwise negative.   Physical Exam: Vitals:   09/25/23 2009 09/25/23 2317 09/26/23 0418 09/26/23 0827  BP: (!) 162/90 (!) 182/91 (!) 176/90 (!) 191/79  Pulse:  79 71 71  Resp: 18 18 18 18   Temp: 98.1 F (36.7 C) 98.1 F (36.7 C) 98 F (36.7 C) 97.8 F (36.6 C)  TempSrc: Oral Oral Oral Oral  SpO2:  100% 100% 100%  Weight: 127.7 kg     Height: 6' 2 (1.88 m)        General: obese male in no acute distress. Head: Normocephalic, atraumatic, sclera non-icteric, mucus membranes are moist Neck: Supple. JVD not elevated. Lungs: CTAB A/P Heart: S1,S2 RRR 2/6 systolic M.  Abdomen: NABS, NT. M-S:  Strength and tone appear normal for age. Lower extremities:No LE edema  Neuro: Alert and oriented X 3. Moves all extremities spontaneously. Psych:  Responds to questions appropriately with a normal affect. Dialysis Access: L AVF + T/B  Dialysis Orders: Center: [facility] MWF 3:45 hrs 180NRe 450/500 121.4 kg 2.0 K/ 2.0 Ca AVF - Heparin  3600 units IV three times per week - Calicitriol 4.0 mcg PO three times per week - Mircera 30 mcg IV q  4 week (has not been started yet).   Assessment/Plan:  Chest pain-work up per cards/primary  ESRD -  MWF Next HD 09/27/2023  Hypertension/volume  - Very hypertensive. Amlodipine  added per cards. Continue home medications. No excess volume by exam. UF as tolerated.   Hyponatremia: Chronic issue. Continue Furosemide  as ordered. Fluid restrictions, optimize volume with UF.   Anemia  - HGB 10.5  ESA ordered but not started as OP. Doesn't need now. Follow HGB.   Metabolic bone disease -  Continue sevelamer  binders, senispar, VDRA  Nutrition - Albumin  low. Protein supplements.  Kardell Virgil H. Delores, NP-C 09/26/2023, 1:04 PM  [employer] [phone]

## 2023-09-26 NOTE — Hospital Course (Addendum)
 Brief Narrative:   63 year old with history of HTN, HLD, ESRD, CAD, hypothyroidism, anemia, GERD, diastolic CHF, colon cancer, DVT, retinal artery thrombosis, OSA, pancreatitis, gout presented with chest pain.  PET/CT on 7/1 as outpatient showed multivessel CAD.  Assessment & Plan:  Principal Problem:   Chest pain, rule out acute myocardial infarction Active Problems:   Gout   HTN (hypertension)   Anemia in chronic kidney disease   OSA (obstructive sleep apnea)   GERD (gastroesophageal reflux disease)   Chronic diastolic HF (heart failure) (HCC)   Obesity, diabetes, and hypertension syndrome (HCC)   CAD (coronary artery disease)   Obesity (BMI 30-39.9)   Postoperative hypothyroidism   Hyperlipidemia LDL goal <130   History of TB (tuberculosis)   Carcinoma in situ of colon   End stage renal disease (HCC)   Neuropathy   Chest pain with concerns of multivessel CAD - Abnormal PET scan outpatient showing multivessel CAD.  Patient will require left heart catheterization.  LDL 55, A1c 5.5 -Echocardiogram-pending - Seen by cardiology, for now continue aspirin , beta-blocker, Imdur , further adjust as necessary  ESRD - Nephrology following  Essential hypertension; uncontrolled -Norvasc , Imdur , daily Lasix  IV as needed  Chronic diastolic CHF Last echocardiogram 2023 showing preserved EF, repeat echo has been ordered.  Volume managed by dialysis   Hyperlipidemia - Continue rosuvastatin    Hypothyroidism - Continue home Synthroid    Neuropathy - Continue gabapentin    Anemia, chronic Hemoglobin around baseline of 10   GERD - Continue home PPI   OSA Bedtime CPAP   Obesity OHS - Noted   DVT prophylaxis: heparin  injection 5,000 Units Start: 09/25/23 2200    Code Status: Full Code Family Communication:   Status is: Inpatient Admitted for cardiac evaluation   Subjective:  No complaints at this time.   Examination:  General exam: Appears calm and comfortable   Respiratory system: Clear to auscultation. Respiratory effort normal. Cardiovascular system: S1 & S2 heard, RRR. No JVD, murmurs, rubs, gallops or clicks. No pedal edema. Gastrointestinal system: Abdomen is nondistended, soft and nontender. No organomegaly or masses felt. Normal bowel sounds heard. Central nervous system: Alert and oriented. No focal neurological deficits. Extremities: Symmetric 5 x 5 power. Skin: No rashes, lesions or ulcers Psychiatry: Judgement and insight appear normal. Mood & affect appropriate.

## 2023-09-26 NOTE — Progress Notes (Signed)
 PROGRESS NOTE    Craig Flynn  FMW:[id number] DOB: 1960-05-31 DOA: 09/25/2023 PCP: Norleen Agent ORN, MD    Brief Narrative:   63 year old with history of HTN, HLD, ESRD, CAD, hypothyroidism, anemia, GERD, diastolic CHF, colon cancer, DVT, retinal artery thrombosis, OSA, pancreatitis, gout presented with chest pain.  PET/CT on 7/1 as outpatient showed multivessel CAD.  Assessment & Plan:  Principal Problem:   Chest pain, rule out acute myocardial infarction Active Problems:   Gout   HTN (hypertension)   Anemia in chronic kidney disease   OSA (obstructive sleep apnea)   GERD (gastroesophageal reflux disease)   Chronic diastolic HF (heart failure) (HCC)   Obesity, diabetes, and hypertension syndrome (HCC)   CAD (coronary artery disease)   Obesity (BMI 30-39.9)   Postoperative hypothyroidism   Hyperlipidemia LDL goal <130   History of TB (tuberculosis)   Carcinoma in situ of colon   End stage renal disease (HCC)   Neuropathy   Chest pain with concerns of multivessel CAD - Abnormal PET scan outpatient showing multivessel CAD.  Patient will require left heart catheterization.  LDL 55, A1c 5.5 -Echocardiogram - Seen by cardiology, for now continue aspirin , beta-blocker, Imdur , further adjust as necessary  ESRD - I will consult nephrology  Essential hypertension; uncontrolled -Norvasc , adjust as needed - Imdur  -IV as needed  Chronic diastolic CHF Last echocardiogram 2023 showing preserved EF, repeat echo has been ordered.  Volume managed by dialysis   Hyperlipidemia - Continue rosuvastatin    Hypothyroidism - Continue home Synthroid    Neuropathy - Continue gabapentin    Anemia, chronic Hemoglobin around baseline of 10   GERD - Continue home PPI   OSA - Not using CPAP recently - Retrial of CPAP   Obesity OHS - Noted   DVT prophylaxis: heparin  injection 5,000 Units Start: 09/25/23 2200      Code Status: Full Code Family Communication:   Status is:  Inpatient Admitted for cardiac evaluation   Subjective:  Seen at bedside, denying any chest pain  Examination:  General exam: Appears calm and comfortable  Respiratory system: Clear to auscultation. Respiratory effort normal. Cardiovascular system: S1 & S2 heard, RRR. No JVD, murmurs, rubs, gallops or clicks. No pedal edema. Gastrointestinal system: Abdomen is nondistended, soft and nontender. No organomegaly or masses felt. Normal bowel sounds heard. Central nervous system: Alert and oriented. No focal neurological deficits. Extremities: Symmetric 5 x 5 power. Skin: No rashes, lesions or ulcers Psychiatry: Judgement and insight appear normal. Mood & affect appropriate.                 Diet Orders (From admission, onward)     Start     Ordered   09/25/23 1818  Diet renal with fluid restriction Fluid restriction: 1200 mL Fluid; Room service appropriate? Yes; Fluid consistency: Thin  Diet effective now       Question Answer Comment  Fluid restriction: 1200 mL Fluid   Room service appropriate? Yes   Fluid consistency: Thin      09/25/23 1817            Objective: Vitals:   09/25/23 2009 09/25/23 2317 09/26/23 0418 09/26/23 0827  BP: (!) 162/90 (!) 182/91 (!) 176/90 (!) 191/79  Pulse:  79 71 71  Resp: 18 18 18 18   Temp: 98.1 F (36.7 C) 98.1 F (36.7 C) 98 F (36.7 C) 97.8 F (36.6 C)  TempSrc: Oral Oral Oral Oral  SpO2:  100% 100% 100%  Weight: 127.7 kg  Height: 6' 2 (1.88 m)       Intake/Output Summary (Last 24 hours) at 09/26/2023 0954 Last data filed at 09/26/2023 0436 Gross per 24 hour  Intake 480 ml  Output --  Net 480 ml   Filed Weights   09/25/23 1229 09/25/23 2009  Weight: 127 kg 127.7 kg    Scheduled Meds:  amLODipine   2.5 mg Oral Daily   aspirin  EC  81 mg Oral Daily   furosemide   80 mg Oral Daily   gabapentin   300 mg Oral QHS   heparin   5,000 Units Subcutaneous Q8H   isosorbide  mononitrate  30 mg Oral Daily   levothyroxine    125 mcg Oral Q0600   pantoprazole   40 mg Oral Daily   rosuvastatin   10 mg Oral Daily   sodium chloride  flush  3 mL Intravenous Q12H   Continuous Infusions:  Nutritional status     Body mass index is 36.15 kg/m.  Data Reviewed:   CBC: Recent Labs  Lab 09/25/23 1242 09/26/23 0414  WBC 5.8 5.1  HGB 10.6* 10.5*  HCT 32.6* 31.3*  MCV 86.9 86.0  PLT 135* 128*   Basic Metabolic Panel: Recent Labs  Lab 09/25/23 1242 09/25/23 1415 09/26/23 0414  NA 129*  --  128*  K 3.8  --  4.1  CL 86*  --  90*  CO2 29  --  25  GLUCOSE 98  --  84  BUN 23  --  28*  CREATININE 7.40*  --  8.05*  CALCIUM  10.1  --  9.5  MG  --  1.7  --    GFR: Estimated Creatinine Clearance: 13.5 mL/min (A) (by C-G formula based on SCr of 8.05 mg/dL (H)). Liver Function Tests: Recent Labs  Lab 09/25/23 1415 09/26/23 0414  AST 16 13*  ALT 17 15  ALKPHOS 54 41  BILITOT 0.7 0.7  PROT 6.3* 5.6*  ALBUMIN  3.8 3.4*   Recent Labs  Lab 09/25/23 1415  LIPASE 38   No results for input(s): AMMONIA in the last 168 hours. Coagulation Profile: No results for input(s): INR, PROTIME in the last 168 hours. Cardiac Enzymes: No results for input(s): CKTOTAL, CKMB, CKMBINDEX, TROPONINI in the last 168 hours. BNP (last 3 results) No results for input(s): PROBNP in the last 8760 hours. HbA1C: Recent Labs    09/26/23 0833  HGBA1C 5.5   CBG: No results for input(s): GLUCAP in the last 168 hours. Lipid Profile: Recent Labs    09/26/23 0833  CHOL 105  HDL 32*  LDLCALC 55  TRIG 91  CHOLHDL 3.3   Thyroid  Function Tests: No results for input(s): TSH, T4TOTAL, FREET4, T3FREE, THYROIDAB in the last 72 hours. Anemia Panel: No results for input(s): VITAMINB12, FOLATE, FERRITIN, TIBC, IRON , RETICCTPCT in the last 72 hours. Sepsis Labs: No results for input(s): PROCALCITON, LATICACIDVEN in the last 168 hours.  No results found for this or any previous visit (from  the past 240 hours).       Radiology Studies: DG Chest 2 View Result Date: 09/25/2023 CLINICAL DATA:  Chest pain with dizziness for a while. EXAM: CHEST - 2 VIEW COMPARISON:  Radiographs 06/17/2023 and 05/05/2023. CT 02/27/2015. Cardiac PET-CT 09/21/2023. FINDINGS: The heart size and mediastinal contours are stable with aortic atherosclerosis. The lungs are clear. There is no pleural effusion or pneumothorax. No acute osseous findings. IMPRESSION: No evidence of acute cardiopulmonary process. Aortic atherosclerosis. Electronically Signed   By: Elsie Perone M.D.   On: 09/25/2023 13:46  LOS: 1 day   Time spent= 35 mins    Burgess JAYSON Dare, MD [facility]  If 7PM-7AM, please contact night-coverage  09/26/2023, 9:54 AM

## 2023-09-26 NOTE — Progress Notes (Signed)
 Progress Note  Patient Name: Craig Flynn Date of Encounter: 09/26/2023  Primary Cardiologist:   Agent Schilling, MD   Subjective   Denies chest pain or SOB.  Inpatient Medications    Scheduled Meds:  amLODipine   2.5 mg Oral Daily   aspirin  EC  81 mg Oral Daily   furosemide   80 mg Oral Daily   gabapentin   300 mg Oral QHS   heparin   5,000 Units Subcutaneous Q8H   isosorbide  mononitrate  30 mg Oral Daily   levothyroxine   125 mcg Oral Q0600   pantoprazole   40 mg Oral Daily   rosuvastatin   10 mg Oral Daily   sodium chloride  flush  3 mL Intravenous Q12H   Continuous Infusions:  PRN Meds: acetaminophen  **OR** acetaminophen , glucagon  (human recombinant), hydrALAZINE , ipratropium-albuterol , metoprolol  tartrate, polyethylene glycol   Vital Signs    Vitals:   09/25/23 2009 09/25/23 2317 09/26/23 0418 09/26/23 0827  BP: (!) 162/90 (!) 182/91 (!) 176/90 (!) 191/79  Pulse:  79 71 71  Resp: 18 18 18 18   Temp: 98.1 F (36.7 C) 98.1 F (36.7 C) 98 F (36.7 C) 97.8 F (36.6 C)  TempSrc: Oral Oral Oral Oral  SpO2:  100% 100% 100%  Weight: 127.7 kg     Height: 6' 2 (1.88 m)       Intake/Output Summary (Last 24 hours) at 09/26/2023 1024 Last data filed at 09/26/2023 1006 Gross per 24 hour  Intake 580 ml  Output --  Net 580 ml   Filed Weights   09/25/23 1229 09/25/23 2009  Weight: 127 kg 127.7 kg    Telemetry    NSR, PVCs - Personally Reviewed  ECG    NA - Personally Reviewed  Physical Exam   GEN: No acute distress.   Neck: No  JVD Cardiac: RRR, no murmurs, rubs, or gallops.  Respiratory: Clear  to auscultation bilaterally. GI: Soft, nontender, non-distended  MS: No  edema; No deformity. Neuro:  Nonfocal  Psych: Normal affect   Labs    Chemistry Recent Labs  Lab 09/25/23 1242 09/25/23 1415 09/26/23 0414  NA 129*  --  128*  K 3.8  --  4.1  CL 86*  --  90*  CO2 29  --  25  GLUCOSE 98  --  84  BUN 23  --  28*  CREATININE 7.40*  --  8.05*   CALCIUM  10.1  --  9.5  PROT  --  6.3* 5.6*  ALBUMIN   --  3.8 3.4*  AST  --  16 13*  ALT  --  17 15  ALKPHOS  --  54 41  BILITOT  --  0.7 0.7  GFRNONAA 8*  --  7*  ANIONGAP 14  --  13     Hematology Recent Labs  Lab 09/25/23 1242 09/26/23 0414  WBC 5.8 5.1  RBC 3.75* 3.64*  HGB 10.6* 10.5*  HCT 32.6* 31.3*  MCV 86.9 86.0  MCH 28.3 28.8  MCHC 32.5 33.5  RDW 13.6 13.6  PLT 135* 128*    Cardiac EnzymesNo results for input(s): TROPONINI in the last 168 hours. No results for input(s): TROPIPOC in the last 168 hours.   BNP Recent Labs  Lab 09/25/23 1415  BNP 276.6*     DDimer No results for input(s): DDIMER in the last 168 hours.   Radiology    DG Chest 2 View Result Date: 09/25/2023 CLINICAL DATA:  Chest pain with dizziness for a while. EXAM: CHEST -  2 VIEW COMPARISON:  Radiographs 06/17/2023 and 05/05/2023. CT 02/27/2015. Cardiac PET-CT 09/21/2023. FINDINGS: The heart size and mediastinal contours are stable with aortic atherosclerosis. The lungs are clear. There is no pleural effusion or pneumothorax. No acute osseous findings. IMPRESSION: No evidence of acute cardiopulmonary process. Aortic atherosclerosis. Electronically Signed   By: Elsie Perone M.D.   On: 09/25/2023 13:46    Cardiac Studies   Echo pending.   Patient Profile     63 y.o. male with a hx of cardiomyopathy who is being seen today for the evaluation of chest pain at the request of Dr. Melvenia.     Assessment & Plan    CARDIOMYOPATHY:    He has had a mildly reduced ejection fraction of about 45%. In July 2016 his EF was said to be about 55%. Perfusion study in 2017 demonstrated EF of 47% with a fixed inferior wall defect. Echo 2023 with EF 55 - 60%.  Follow up echo pending this admission. Volume per dialysis and he still makes urine and is on Lasix .  Euvolemic despite mildly elevated BNP.    HTN:   He had previously been on midodrine  with some low BPs with dialysis.  I had recently stopped  this and started Norvasc .  Now consistently very hypertensive.   Increase Norvasc .  Hye has PRN hydralazine .  I suspect that he has some labile BPs and I will observe on dialysis.  He might need hydralazine  at discharge with dosing to be adjusted on dialysis days.      CKD:   MWF dialysis.  Nephrology consulted.    CHEST PAIN/Abnormal PET:    Cath hopefully on Monday.  On add on board.     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  Free water  restrict.  Follow.      For questions or updates, please contact [facility] Please consult [email or web address] for contact info under Cardiology/STEMI.   Signed, Lynwood Schilling, MD  09/26/2023, 10:24 AM

## 2023-09-27 ENCOUNTER — Encounter (HOSPITAL_COMMUNITY)
Admission: EM | Disposition: A | Discharge status: Home / Self Care | Payer: Self-pay | Source: Home / Self Care | Attending: Internal Medicine

## 2023-09-27 ENCOUNTER — Inpatient Hospital Stay (HOSPITAL_COMMUNITY)

## 2023-09-27 DIAGNOSIS — I2510 Atherosclerotic heart disease of native coronary artery without angina pectoris: Secondary | ICD-10-CM | POA: Diagnosis not present

## 2023-09-27 DIAGNOSIS — I428 Other cardiomyopathies: Secondary | ICD-10-CM | POA: Diagnosis not present

## 2023-09-27 DIAGNOSIS — R079 Chest pain, unspecified: Secondary | ICD-10-CM | POA: Diagnosis not present

## 2023-09-27 HISTORY — PX: LEFT HEART CATH AND CORONARY ANGIOGRAPHY: CATH118249

## 2023-09-27 HISTORY — PX: CORONARY BALLOON ANGIOPLASTY: CATH118233

## 2023-09-27 LAB — CBC
HCT: 29 % — ABNORMAL LOW (ref 39.0–52.0)
HCT: 31.4 % — ABNORMAL LOW (ref 39.0–52.0)
Hemoglobin: 9.6 g/dL — ABNORMAL LOW (ref 13.0–17.0)
Hemoglobin: 10.3 g/dL — ABNORMAL LOW (ref 13.0–17.0)
MCH: 28.4 pg (ref 26.0–34.0)
MCH: 28.4 pg (ref 26.0–34.0)
MCHC: 33.1 g/dL (ref 30.0–36.0)
MCHC: 32.8 g/dL (ref 30.0–36.0)
MCV: 85.8 fL (ref 80.0–100.0)
MCV: 86.5 fL (ref 80.0–100.0)
Platelets: 126 K/uL — ABNORMAL LOW (ref 150–400)
Platelets: 118 K/uL — ABNORMAL LOW (ref 150–400)
RBC: 3.38 MIL/uL — ABNORMAL LOW (ref 4.22–5.81)
RBC: 3.63 MIL/uL — ABNORMAL LOW (ref 4.22–5.81)
RDW: 13.8 % (ref 11.5–15.5)
RDW: 13.7 % (ref 11.5–15.5)
WBC: 5.1 K/uL (ref 4.0–10.5)
WBC: 5.2 K/uL (ref 4.0–10.5)
nRBC: 0 % (ref 0.0–0.2)
nRBC: 0 % (ref 0.0–0.2)

## 2023-09-27 LAB — ECHOCARDIOGRAM COMPLETE
AR max vel: 2.69 cm2
AV Area VTI: 2.94 cm2
AV Area mean vel: 2.75 cm2
AV Mean grad: 3.5 mmHg
AV Peak grad: 6.9 mmHg
Ao pk vel: 1.32 m/s
Area-P 1/2: 4.68 cm2
Calc EF: 55.3 %
Height: 74 in
S' Lateral: 4.1 cm
Single Plane A2C EF: 57.2 %
Single Plane A4C EF: 52.5 %
Weight: 4504.44 [oz_av]

## 2023-09-27 LAB — RENAL FUNCTION PANEL
Albumin: 3.6 g/dL (ref 3.5–5.0)
Anion gap: 11 (ref 5–15)
BUN: 46 mg/dL — ABNORMAL HIGH (ref 8–23)
CO2: 27 mmol/L (ref 22–32)
Calcium: 9.1 mg/dL (ref 8.9–10.3)
Chloride: 90 mmol/L — ABNORMAL LOW (ref 98–111)
Creatinine, Ser: 10.34 mg/dL — ABNORMAL HIGH (ref 0.61–1.24)
GFR, Estimated: 5 mL/min — ABNORMAL LOW (ref >60)
Glucose, Bld: 82 mg/dL (ref 70–99)
Phosphorus: 5.5 mg/dL — ABNORMAL HIGH (ref 2.5–4.6)
Potassium: 4.7 mmol/L (ref 3.5–5.1)
Sodium: 128 mmol/L — ABNORMAL LOW (ref 135–145)

## 2023-09-27 LAB — BASIC METABOLIC PANEL WITH GFR
Anion gap: 13 (ref 5–15)
BUN: 40 mg/dL — ABNORMAL HIGH (ref 8–23)
CO2: 24 mmol/L (ref 22–32)
Calcium: 8.8 mg/dL — ABNORMAL LOW (ref 8.9–10.3)
Chloride: 90 mmol/L — ABNORMAL LOW (ref 98–111)
Creatinine, Ser: 9.44 mg/dL — ABNORMAL HIGH (ref 0.61–1.24)
GFR, Estimated: 6 mL/min — ABNORMAL LOW (ref >60)
Glucose, Bld: 82 mg/dL (ref 70–99)
Potassium: 3.9 mmol/L (ref 3.5–5.1)
Sodium: 127 mmol/L — ABNORMAL LOW (ref 135–145)

## 2023-09-27 LAB — POCT ACTIVATED CLOTTING TIME
Activated Clotting Time: 256 s
Activated Clotting Time: 291 s

## 2023-09-27 LAB — MAGNESIUM: Magnesium: 1.6 mg/dL — ABNORMAL LOW (ref 1.7–2.4)

## 2023-09-27 LAB — PHOSPHORUS: Phosphorus: 5.6 mg/dL — ABNORMAL HIGH (ref 2.5–4.6)

## 2023-09-27 LAB — MRSA NEXT GEN BY PCR, NASAL: MRSA by PCR Next Gen: NOT DETECTED

## 2023-09-27 SURGERY — LEFT HEART CATH AND CORONARY ANGIOGRAPHY
Anesthesia: LOCAL

## 2023-09-27 MED ORDER — IOHEXOL 350 MG/ML SOLN
INTRAVENOUS | Status: DC | PRN
Start: 2023-09-27 — End: 2023-09-27
  Administered 2023-09-27: 100 mL

## 2023-09-27 MED ORDER — HEPARIN SODIUM (PORCINE) 1000 UNIT/ML DIALYSIS
3600.0000 [IU] | Freq: Once | INTRAMUSCULAR | Status: DC
  Filled 2023-09-27: qty 4

## 2023-09-27 MED ORDER — HEPARIN SODIUM (PORCINE) 1000 UNIT/ML IJ SOLN
INTRAMUSCULAR | Status: AC
  Filled 2023-09-27: qty 10

## 2023-09-27 MED ORDER — SODIUM CHLORIDE 0.9% FLUSH: 3.0000 mL | INTRAVENOUS | Status: DC | PRN

## 2023-09-27 MED ORDER — MIDAZOLAM HCL 2 MG/2ML IJ SOLN
INTRAMUSCULAR | Status: DC | PRN
  Administered 2023-09-27: 2 mg via INTRAVENOUS

## 2023-09-27 MED ORDER — LIDOCAINE-EPINEPHRINE 1 %-1:100000 IJ SOLN
INTRAMUSCULAR | Status: DC | PRN
  Administered 2023-09-27: 4 mL via INTRADERMAL

## 2023-09-27 MED ORDER — LIDOCAINE-PRILOCAINE 2.5-2.5 % EX CREA
1.0000 | TOPICAL_CREAM | CUTANEOUS | Status: DC | PRN
  Filled 2023-09-27: qty 5

## 2023-09-27 MED ORDER — FENTANYL CITRATE (PF) 100 MCG/2ML IJ SOLN
INTRAMUSCULAR | Status: AC
  Filled 2023-09-27: qty 2

## 2023-09-27 MED ORDER — FENTANYL CITRATE (PF) 100 MCG/2ML IJ SOLN
INTRAMUSCULAR | Status: DC | PRN
  Administered 2023-09-27: 50 ug via INTRAVENOUS
  Administered 2023-09-27 (×2): 25 ug via INTRAVENOUS

## 2023-09-27 MED ORDER — PENTAFLUOROPROP-TETRAFLUOROETH EX AERO: 1.0000 | INHALATION_SPRAY | CUTANEOUS | Status: DC | PRN

## 2023-09-27 MED ORDER — HEPARIN SODIUM (PORCINE) 1000 UNIT/ML IJ SOLN
INTRAMUSCULAR | Status: DC | PRN
  Administered 2023-09-27: 3000 [IU] via INTRAVENOUS
  Administered 2023-09-27: 10000 [IU] via INTRAVENOUS

## 2023-09-27 MED ORDER — ISOSORBIDE MONONITRATE ER 60 MG PO TB24
60.0000 mg | ORAL_TABLET | Freq: Every day | ORAL | Status: DC
  Administered 2023-09-28: 60 mg via ORAL
  Filled 2023-09-27 (×2): qty 1

## 2023-09-27 MED ORDER — LIDOCAINE-EPINEPHRINE 1 %-1:100000 IJ SOLN
INTRAMUSCULAR | Status: AC
  Filled 2023-09-27: qty 1

## 2023-09-27 MED ORDER — LIDOCAINE HCL (PF) 1 % IJ SOLN
INTRAMUSCULAR | Status: AC
Start: 2023-09-27 — End: 2023-09-27
  Filled 2023-09-27: qty 30

## 2023-09-27 MED ORDER — ISOSORBIDE MONONITRATE ER 30 MG PO TB24
30.0000 mg | ORAL_TABLET | Freq: Every day | ORAL | Status: AC
  Administered 2023-09-27: 30 mg via ORAL
  Filled 2023-09-27 (×2): qty 1

## 2023-09-27 MED ORDER — LIDOCAINE HCL (PF) 1 % IJ SOLN
INTRAMUSCULAR | Status: DC | PRN
Start: 2023-09-27 — End: 2023-09-27
  Administered 2023-09-27: 5 mL via INTRADERMAL

## 2023-09-27 MED ORDER — MAGNESIUM OXIDE -MG SUPPLEMENT 400 (240 MG) MG PO TABS
800.0000 mg | ORAL_TABLET | Freq: Once | ORAL | Status: AC
  Administered 2023-09-27: 800 mg via ORAL
  Filled 2023-09-27: qty 2

## 2023-09-27 MED ORDER — MIDAZOLAM HCL 2 MG/2ML IJ SOLN
INTRAMUSCULAR | Status: AC
  Filled 2023-09-27: qty 2

## 2023-09-27 MED ORDER — ASPIRIN 81 MG PO CHEW
81.0000 mg | CHEWABLE_TABLET | ORAL | Status: AC
  Administered 2023-09-27: 81 mg via ORAL
  Filled 2023-09-27: qty 1

## 2023-09-27 MED ORDER — NEPRO/CARBSTEADY PO LIQD: 237.0000 mL | ORAL | Status: DC | PRN

## 2023-09-27 MED ORDER — CLOPIDOGREL BISULFATE 75 MG PO TABS
75.0000 mg | ORAL_TABLET | Freq: Every day | ORAL | Status: DC
  Administered 2023-09-27 – 2023-09-28 (×2): 75 mg via ORAL
  Filled 2023-09-27 (×2): qty 1

## 2023-09-27 MED ORDER — SODIUM CHLORIDE 0.9 % IV SOLN: INTRAVENOUS | Status: DC

## 2023-09-27 MED ORDER — LIDOCAINE HCL (PF) 1 % IJ SOLN
5.0000 mL | INTRAMUSCULAR | Status: DC | PRN
  Filled 2023-09-27: qty 5

## 2023-09-27 MED ORDER — HEPARIN (PORCINE) IN NACL 1000-0.9 UT/500ML-% IV SOLN
INTRAVENOUS | Status: DC | PRN
  Administered 2023-09-27 (×2): 500 mL

## 2023-09-27 MED ORDER — SODIUM CHLORIDE 0.9 % IV SOLN: 250.0000 mL | INTRAVENOUS | Status: DC | PRN

## 2023-09-27 SURGICAL SUPPLY — 17 items
BALLOON EMERGE MR 2.5X20 (BALLOONS) IMPLANT
BALLOON SAPPHIRE 1.0X8 (BALLOONS) IMPLANT
CATH INFINITI 5FR MULTPACK ANG (CATHETERS) IMPLANT
CATH VISTA GUIDE 6FR JR4 ECOPK (CATHETERS) IMPLANT
CLOSURE PERCLOSE PROSTYLE (VASCULAR PRODUCTS) IMPLANT
KIT ENCORE 26 ADVANTAGE (KITS) IMPLANT
KIT HEMO VALVE WATCHDOG (MISCELLANEOUS) IMPLANT
KIT MICROPUNCTURE NIT STIFF (SHEATH) IMPLANT
PACK CARDIAC CATHETERIZATION (CUSTOM PROCEDURE TRAY) ×2 IMPLANT
SET ATX-X65L (MISCELLANEOUS) IMPLANT
SHEATH PINNACLE 5F 10CM (SHEATH) IMPLANT
SHEATH PINNACLE 6F 10CM (SHEATH) IMPLANT
SHEATH PROBE COVER 6X72 (BAG) IMPLANT
WIRE ASAHI MIRACLEBROS-6 180CM (WIRE) IMPLANT
WIRE ASAHI PROWATER 180CM (WIRE) IMPLANT
WIRE EMERALD 3MM-J .035X150CM (WIRE) IMPLANT
WIRE HI TORQ WHISPER MS 190CM (WIRE) IMPLANT

## 2023-09-27 NOTE — H&P (View-Only) (Signed)
 Progress Note  Patient Name: Craig Flynn Date of Encounter: 09/27/2023  Primary Cardiologist:   Agent Schilling, MD   Subjective   No pain.  No SOB.   Inpatient Medications    Scheduled Meds:  amLODipine   5 mg Oral Daily   aspirin  EC  81 mg Oral Daily   calcitRIOL   4 mcg Oral Q M,W,F-HD   cinacalcet   90 mg Oral Q supper   furosemide   80 mg Oral Daily   gabapentin   300 mg Oral QHS   heparin   5,000 Units Subcutaneous Q8H   isosorbide  mononitrate  30 mg Oral Daily   levothyroxine   125 mcg Oral Q0600   magnesium  oxide  800 mg Oral Once   pantoprazole   40 mg Oral Daily   rosuvastatin   10 mg Oral Daily   sevelamer  carbonate  800 mg Oral TID WC   sodium chloride  flush  3 mL Intravenous Q12H   Continuous Infusions:  PRN Meds: acetaminophen  **OR** acetaminophen , glucagon  (human recombinant), hydrALAZINE , ipratropium-albuterol , metoprolol  tartrate, polyethylene glycol   Vital Signs    Vitals:   09/26/23 1606 09/26/23 2016 09/26/23 2334 09/27/23 0420  BP:  (!) 143/53 132/61 131/69  Pulse: 79 90  87  Resp: 18 18 18 18   Temp: 98.8 F (37.1 C) 98.9 F (37.2 C) 98.4 F (36.9 C) 98.5 F (36.9 C)  TempSrc: Oral Oral Oral Oral  SpO2:  100% 99% 99%  Weight:      Height:        Intake/Output Summary (Last 24 hours) at 09/27/2023 0802 Last data filed at 09/26/2023 1006 Gross per 24 hour  Intake 100 ml  Output --  Net 100 ml   Filed Weights   09/25/23 1229 09/25/23 2009  Weight: 127 kg 127.7 kg    Telemetry    NSR, PVCs with couplets - Personally Reviewed  ECG    NA - Personally Reviewed  Physical Exam   GEN: No  acute distress.   Neck: No  JVD Cardiac: RRR, no murmurs, rubs, or gallops.  Respiratory: Clear   to auscultation bilaterally. GI: Soft, nontender, non-distended, normal bowel sounds  MS:  No edema; No deformity. Neuro:   Nonfocal  Psych: Oriented and appropriate   Labs    Chemistry Recent Labs  Lab 09/25/23 1242 09/25/23 1415  09/26/23 0414 09/27/23 0417  NA 129*  --  128* 127*  K 3.8  --  4.1 3.9  CL 86*  --  90* 90*  CO2 29  --  25 24  GLUCOSE 98  --  84 82  BUN 23  --  28* 40*  CREATININE 7.40*  --  8.05* 9.44*  CALCIUM  10.1  --  9.5 8.8*  PROT  --  6.3* 5.6*  --   ALBUMIN   --  3.8 3.4*  --   AST  --  16 13*  --   ALT  --  17 15  --   ALKPHOS  --  54 41  --   BILITOT  --  0.7 0.7  --   GFRNONAA 8*  --  7* 6*  ANIONGAP 14  --  13 13     Hematology Recent Labs  Lab 09/25/23 1242 09/26/23 0414 09/27/23 0417  WBC 5.8 5.1 5.1  RBC 3.75* 3.64* 3.38*  HGB 10.6* 10.5* 9.6*  HCT 32.6* 31.3* 29.0*  MCV 86.9 86.0 85.8  MCH 28.3 28.8 28.4  MCHC 32.5 33.5 33.1  RDW 13.6 13.6 13.8  PLT 135* 128* 126*    Cardiac EnzymesNo results for input(s): TROPONINI in the last 168 hours. No results for input(s): TROPIPOC in the last 168 hours.   BNP Recent Labs  Lab 09/25/23 1415  BNP 276.6*     DDimer No results for input(s): DDIMER in the last 168 hours.   Radiology    DG Chest 2 View Result Date: 09/25/2023 CLINICAL DATA:  Chest pain with dizziness for a while. EXAM: CHEST - 2 VIEW COMPARISON:  Radiographs 06/17/2023 and 05/05/2023. CT 02/27/2015. Cardiac PET-CT 09/21/2023. FINDINGS: The heart size and mediastinal contours are stable with aortic atherosclerosis. The lungs are clear. There is no pleural effusion or pneumothorax. No acute osseous findings. IMPRESSION: No evidence of acute cardiopulmonary process. Aortic atherosclerosis. Electronically Signed   By: Elsie Perone M.D.   On: 09/25/2023 13:46    Cardiac Studies   Echo pending.   Patient Profile     62 y.o. male with a hx of cardiomyopathy who is being seen today for the evaluation of chest pain at the request of Dr. Melvenia.     Assessment & Plan    CARDIOMYOPATHY:    He has had a mildly reduced ejection fraction of about 45%. In July 2016 his EF was said to be about 55%. Perfusion study in 2017 demonstrated EF of 47% with a  fixed inferior wall defect. Echo 2023 with EF 55 - 60%.  Follow up echo still pending this admission.  Continue current therapy.      HTN:   He had previously been on midodrine  with some low BPs with dialysis.  Increased Norvasc  this admission.  He has had hypotension with dialysis and we will need to see how he does with BPs on Norvasc  this admission and beyond.    CKD:   MWF dialysis.  HD today.      CHEST PAIN/Abnormal PET:    Cath today. On add on board.     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  Free water  restrict.  Continues to drift down.  No fatigue or AMS.       For questions or updates, please contact [facility] Please consult [email or web address] for contact info under Cardiology/STEMI.   Signed, Lynwood Schilling, MD  09/27/2023, 8:02 AM

## 2023-09-27 NOTE — Interval H&P Note (Signed)
 History and Physical Interval Note:  09/27/2023 2:28 PM  Craig Flynn  has presented today for surgery, with the diagnosis of nstemi.  The various methods of treatment have been discussed with the patient and family. After consideration of risks, benefits and other options for treatment, the patient has consented to  Procedure(s): LEFT HEART CATH AND CORONARY ANGIOGRAPHY (N/A) for chest pain and HFmREF as a surgical intervention.  The patient's history has been reviewed, patient examined, no change in status, stable for surgery.  I have reviewed the patient's chart and labs.  Questions were answered to the patient's satisfaction.     Gordy Bergamo

## 2023-09-27 NOTE — Progress Notes (Signed)
 Progress Note  Patient Name: Craig Flynn Date of Encounter: 09/27/2023  Primary Cardiologist:   Agent Schilling, MD   Subjective   No pain.  No SOB.   Inpatient Medications    Scheduled Meds:  amLODipine   5 mg Oral Daily   aspirin  EC  81 mg Oral Daily   calcitRIOL   4 mcg Oral Q M,W,F-HD   cinacalcet   90 mg Oral Q supper   furosemide   80 mg Oral Daily   gabapentin   300 mg Oral QHS   heparin   5,000 Units Subcutaneous Q8H   isosorbide  mononitrate  30 mg Oral Daily   levothyroxine   125 mcg Oral Q0600   magnesium  oxide  800 mg Oral Once   pantoprazole   40 mg Oral Daily   rosuvastatin   10 mg Oral Daily   sevelamer  carbonate  800 mg Oral TID WC   sodium chloride  flush  3 mL Intravenous Q12H   Continuous Infusions:  PRN Meds: acetaminophen  **OR** acetaminophen , glucagon  (human recombinant), hydrALAZINE , ipratropium-albuterol , metoprolol  tartrate, polyethylene glycol   Vital Signs    Vitals:   09/26/23 1606 09/26/23 2016 09/26/23 2334 09/27/23 0420  BP:  (!) 143/53 132/61 131/69  Pulse: 79 90  87  Resp: 18 18 18 18   Temp: 98.8 F (37.1 C) 98.9 F (37.2 C) 98.4 F (36.9 C) 98.5 F (36.9 C)  TempSrc: Oral Oral Oral Oral  SpO2:  100% 99% 99%  Weight:      Height:        Intake/Output Summary (Last 24 hours) at 09/27/2023 0802 Last data filed at 09/26/2023 1006 Gross per 24 hour  Intake 100 ml  Output --  Net 100 ml   Filed Weights   09/25/23 1229 09/25/23 2009  Weight: 127 kg 127.7 kg    Telemetry    NSR, PVCs with couplets - Personally Reviewed  ECG    NA - Personally Reviewed  Physical Exam   GEN: No  acute distress.   Neck: No  JVD Cardiac: RRR, no murmurs, rubs, or gallops.  Respiratory: Clear   to auscultation bilaterally. GI: Soft, nontender, non-distended, normal bowel sounds  MS:  No edema; No deformity. Neuro:   Nonfocal  Psych: Oriented and appropriate   Labs    Chemistry Recent Labs  Lab 09/25/23 1242 09/25/23 1415  09/26/23 0414 09/27/23 0417  NA 129*  --  128* 127*  K 3.8  --  4.1 3.9  CL 86*  --  90* 90*  CO2 29  --  25 24  GLUCOSE 98  --  84 82  BUN 23  --  28* 40*  CREATININE 7.40*  --  8.05* 9.44*  CALCIUM  10.1  --  9.5 8.8*  PROT  --  6.3* 5.6*  --   ALBUMIN   --  3.8 3.4*  --   AST  --  16 13*  --   ALT  --  17 15  --   ALKPHOS  --  54 41  --   BILITOT  --  0.7 0.7  --   GFRNONAA 8*  --  7* 6*  ANIONGAP 14  --  13 13     Hematology Recent Labs  Lab 09/25/23 1242 09/26/23 0414 09/27/23 0417  WBC 5.8 5.1 5.1  RBC 3.75* 3.64* 3.38*  HGB 10.6* 10.5* 9.6*  HCT 32.6* 31.3* 29.0*  MCV 86.9 86.0 85.8  MCH 28.3 28.8 28.4  MCHC 32.5 33.5 33.1  RDW 13.6 13.6 13.8  PLT 135* 128* 126*    Cardiac EnzymesNo results for input(s): TROPONINI in the last 168 hours. No results for input(s): TROPIPOC in the last 168 hours.   BNP Recent Labs  Lab 09/25/23 1415  BNP 276.6*     DDimer No results for input(s): DDIMER in the last 168 hours.   Radiology    DG Chest 2 View Result Date: 09/25/2023 CLINICAL DATA:  Chest pain with dizziness for a while. EXAM: CHEST - 2 VIEW COMPARISON:  Radiographs 06/17/2023 and 05/05/2023. CT 02/27/2015. Cardiac PET-CT 09/21/2023. FINDINGS: The heart size and mediastinal contours are stable with aortic atherosclerosis. The lungs are clear. There is no pleural effusion or pneumothorax. No acute osseous findings. IMPRESSION: No evidence of acute cardiopulmonary process. Aortic atherosclerosis. Electronically Signed   By: Elsie Perone M.D.   On: 09/25/2023 13:46    Cardiac Studies   Echo pending.   Patient Profile     63 y.o. male with a hx of cardiomyopathy who is being seen today for the evaluation of chest pain at the request of Dr. Melvenia.     Assessment & Plan    CARDIOMYOPATHY:    He has had a mildly reduced ejection fraction of about 45%. In July 2016 his EF was said to be about 55%. Perfusion study in 2017 demonstrated EF of 47% with a  fixed inferior wall defect. Echo 2023 with EF 55 - 60%.  Follow up echo still pending this admission.  Continue current therapy.      HTN:   He had previously been on midodrine  with some low BPs with dialysis.  Increased Norvasc  this admission.  He has had hypotension with dialysis and we will need to see how he does with BPs on Norvasc  this admission and beyond.    CKD:   MWF dialysis.  HD today.      CHEST PAIN/Abnormal PET:    Cath today. On add on board.     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  Free water  restrict.  Continues to drift down.  No fatigue or AMS.       For questions or updates, please contact [facility] Please consult [email or web address] for contact info under Cardiology/STEMI.   Signed, Lynwood Schilling, MD  09/27/2023, 8:02 AM

## 2023-09-27 NOTE — Progress Notes (Signed)
 Pt receives out-pt HD at [facility] on [facility] on MWF 6:00 am chair time. Will assist as needed.   Randine Mungo Renal Navigator [phone]

## 2023-09-27 NOTE — TOC Initial Note (Addendum)
 Transition of Care [facility]) - Initial/Assessment Note    Patient Details  Name: Craig Flynn MRN: [id number] Date of Birth: 1960/11/02  Transition of Care [facility]) CM/SW Contact:    Sudie Erminio Deems, RN Phone Number: 09/27/2023, 3:17 PM  Clinical Narrative:  Risk for readmission assessment completed. Patient presented for chest pain-plan for LHC today. PTA patient reports that he is from home alone. Patient does not use any DME in the home. Patient states he drives to HD MWF and that he gets to PCP appointments without any issues. No home needs identified at the time of the visit. Case Manager will continue to follow for additional needs as he progresses.                  Expected Discharge Plan: Home/Self Care Barriers to Discharge: Continued Medical Work up   Patient Goals and CMS Choice Patient states their goals for this hospitalization and ongoing recovery are:: plan to return home once stable   Choice offered to / list presented to : NA      Expected Discharge Plan and Services   Discharge Planning Services: CM Consult Post Acute Care Choice: NA Living arrangements for the past 2 months: Apartment                   DME Agency: NA       HH Arranged: NA          Prior Living Arrangements/Services Living arrangements for the past 2 months: Apartment Lives with:: Self   Do you feel safe going back to the place where you live?: Yes      Need for Family Participation in Patient Care: No (Comment) Care giver support system in place?: No (comment)   Criminal Activity/Legal Involvement Pertinent to Current Situation/Hospitalization: No - Comment as needed  Activities of Daily Living   ADL Screening (condition at time of admission) Independently performs ADLs?: Yes (appropriate for developmental age) Is the patient deaf or have difficulty hearing?: No Does the patient have difficulty seeing, even when wearing glasses/contacts?: No Does the patient have  difficulty concentrating, remembering, or making decisions?: No  Permission Sought/Granted Permission sought to share information with : Case Manager, Family Supports                Emotional Assessment Appearance:: Appears stated age Attitude/Demeanor/Rapport: Engaged Affect (typically observed): Appropriate Orientation: : Oriented to Self, Oriented to Place, Oriented to  Time, Oriented to Situation Alcohol / Substance Use: Not Applicable Psych Involvement: No (comment)  Admission diagnosis:  Chest pressure [R07.89] Chest pain, rule out acute myocardial infarction [R07.9] Patient Active Problem List   Diagnosis Date Noted   Chest pain, rule out acute myocardial infarction 09/25/2023   Nasal congestion 09/02/2023   Wheezing 09/02/2023   Fluid overload, unspecified 07/05/2023   Other disorders of phosphorus metabolism 04/01/2023   Paresthesia of foot, bilateral 05/06/2022   Cervical radiculitis 02/25/2022   Chronic toe pain, bilateral 02/25/2022   Vertigo 11/08/2021   Dizziness 09/20/2021   Tinnitus of right ear 09/20/2021   Neuropathy 09/20/2021   Postural lightheadedness 06/19/2021   Pulsatile tinnitus of right ear 05/25/2021   Retinal artery thrombosis, 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   Mild protein-calorie malnutrition (HCC) 10/07/2020   Allergy, unspecified, initial encounter 08/28/2020   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/28/2020   Complication of vascular dialysis catheter 08/28/2020   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   End stage renal disease (HCC) 08/28/2020   B12 deficiency 06/08/2020  Gynecomastia, male 06/07/2020   Erectile dysfunction 06/07/2020   Hand cramp 06/07/2020   Aortic atherosclerosis (HCC) 06/07/2020   Umbilical hernia without obstruction and without gangrene 03/18/2020   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   Deficiency anemia 03/18/2020   Hyperlipidemia LDL goal <130 03/18/2020   Upper back pain on left side 02/06/2020   History of TB  (tuberculosis) 10/30/2019   Allergic rhinitis 10/16/2019   CAD (coronary artery disease) 09/07/2019   Nephrosclerosis 09/07/2019   Obesity (BMI 30-39.9) 09/07/2019   Secondary hyperparathyroidism (HCC) 09/07/2019   Obesity, diabetes, 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/22/2019   Ulnar neuritis, right 04/25/2019   Muscle cramps 02/26/2019   Abnormal TSH 02/26/2019   Vitamin D  deficiency 02/22/2019   Iron  deficiency 02/22/2019   Laryngopharyngeal reflux (LPR) 10/04/2018   Whiplash injuries, initial encounter 09/21/2018   Degenerative arthritis of right knee 09/21/2018   Chronic diastolic HF (heart failure) (HCC) 07/04/2018   Intractable post-traumatic headache 06/16/2018   Low back pain 06/15/2018   Neck pain 06/15/2018   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   Thyroid  nodule 01/22/2017   Ptosis 01/09/2016   GERD (gastroesophageal reflux disease) 07/29/2015   Osteoarthritis 05/03/2015   Tobacco abuse 02/26/2015   OSA (obstructive sleep apnea) 12/27/2014   Encounter for well adult exam with abnormal findings 11/12/2014   Anemia in chronic kidney disease 11/12/2014   Left knee pain 09/22/2014   Morbid obesity (HCC) 11/17/2013   HTN (hypertension) 12/11/2011   SOB (shortness of breath) 12/11/2011   Gout 12/31/2006   HERNIATED LUMBAR DISK WITH RADICULOPATHY 12/30/2006   PCP:  Norleen Lynwood ORN, MD Pharmacy:   [id number] [address], [place] - [address] AT [facility] OF [facility] [address] [place] [address] Phone: [phone] Fax: [phone]     Social Drivers of Health (SDOH) Social History: SDOH Screenings   Food Insecurity: No Food Insecurity (09/25/2023)  Housing: Low Risk  (09/25/2023)  Transportation Needs: No Transportation Needs (09/25/2023)  Utilities: Not At Risk (09/25/2023)  Alcohol Screen: Low Risk  (07/06/2023)  Depression (PHQ2-9): Low Risk  (07/15/2023)  Financial Resource Strain: Low Risk  (07/06/2023)  Physical  Activity: Sufficiently Active (07/06/2023)  Social Connections: Moderately Isolated (07/06/2023)  Stress: No Stress Concern Present (07/06/2023)  Tobacco Use: Medium Risk (09/25/2023)  Health Literacy: Adequate Health Literacy (07/06/2023)   SDOH Interventions:     Readmission Risk Interventions     No data to display

## 2023-09-27 NOTE — Plan of Care (Signed)

## 2023-09-27 NOTE — Progress Notes (Signed)
  [facility] Progress Note   Subjective:  Seen in room. Up in chair in street clothes. Feels well Denies chest pain, sob.. For cardiac cath today. HD sometime today.   Objective Vitals:   09/26/23 2334 09/27/23 0420 09/27/23 0812 09/27/23 1209  BP: 132/61 131/69 139/74 (!) 155/62  Pulse:  87 80 76  Resp: 18 18 18 20   Temp: 98.4 F (36.9 C) 98.5 F (36.9 C) 98.1 F (36.7 C) 98.4 F (36.9 C)  TempSrc: Oral Oral Oral Oral  SpO2: 99% 99% 100% 100%  Weight:      Height:         Additional Objective Labs: Basic Metabolic Panel: Recent Labs  Lab 09/25/23 1242 09/26/23 0414 09/27/23 0417  NA 129* 128* 127*  K 3.8 4.1 3.9  CL 86* 90* 90*  CO2 29 25 24   GLUCOSE 98 84 82  BUN 23 28* 40*  CREATININE 7.40* 8.05* 9.44*  CALCIUM  10.1 9.5 8.8*  PHOS  --   --  5.6*   CBC: Recent Labs  Lab 09/25/23 1242 09/26/23 0414 09/27/23 0417  WBC 5.8 5.1 5.1  HGB 10.6* 10.5* 9.6*  HCT 32.6* 31.3* 29.0*  MCV 86.9 86.0 85.8  PLT 135* 128* 126*   Blood Culture    Component Value Date/Time   SDES BLOOD LEFT HAND 11/17/2013 0840   SPECREQUEST BOTTLES DRAWN AEROBIC ONLY 4CC 11/17/2013 0840   CULT  11/17/2013 0840    NO GROWTH 5 DAYS Performed at [facility]   REPTSTATUS 11/23/2013 FINAL 11/17/2013 0840     Physical Exam General: Well appearing, nad Heart: RRR Lungs: Clear, Abdomen: non-tender Extremities: no LE edema  Dialysis Access: LUE AVF +bruit   Medications:  sodium chloride       amLODipine   5 mg Oral Daily   aspirin  EC  81 mg Oral Daily   calcitRIOL   4 mcg Oral Q M,W,F-HD   cinacalcet   90 mg Oral Q supper   furosemide   80 mg Oral Daily   gabapentin   300 mg Oral QHS   heparin   3,600 Units Dialysis Once in dialysis   heparin   5,000 Units Subcutaneous Q8H   isosorbide  mononitrate  30 mg Oral Daily   levothyroxine   125 mcg Oral Q0600   pantoprazole   40 mg Oral Daily   rosuvastatin   10 mg Oral Daily   sevelamer  carbonate  800 mg Oral TID  WC   sodium chloride  flush  3 mL Intravenous Q12H    Dialysis Orders:  [facility] MWF 3:45 hrs 180NRe 450/500 121.4 kg 2.0 K/ 2.0 Ca AVF - Heparin  3600 units IV three times per week - Calicitriol 4.0 mcg PO three times per week - Mircera 30 mcg IV q 4 week (has not been started yet).   Assessment/Plan: Chest pain-work up per cards/primary. For cardiac cath today.   ESRD -  MWF Next HD 7/7  Hypertension/volume  - Very hypertensive.on admit. BP better today.  Continue medications. No excess volume by exam. UF as tolerated.   Hyponatremia: Chronic issue. Continue Furosemide  as ordered. Fluid restrictions, optimize volume with UF.   Anemia  - Hgb 9-10. Follow HGB.   Metabolic bone disease -  Continue sevelamer  binders, senispar, VDRA  Nutrition - Albumin  low. Protein supplements.    Maisie Ronnald Acosta PA-C [facility] 09/27/2023,12:30 PM

## 2023-09-27 NOTE — Plan of Care (Signed)

## 2023-09-27 NOTE — Progress Notes (Signed)
 PROGRESS NOTE    Craig Flynn  FMW:[id number] DOB: 26-Oct-1960 DOA: 09/25/2023 PCP: Norleen Agent ORN, MD    Brief Narrative:   63 year old with history of HTN, HLD, ESRD, CAD, hypothyroidism, anemia, GERD, diastolic CHF, colon cancer, DVT, retinal artery thrombosis, OSA, pancreatitis, gout presented with chest pain.  PET/CT on 7/1 as outpatient showed multivessel CAD.  Assessment & Plan:  Principal Problem:   Chest pain, rule out acute myocardial infarction Active Problems:   Gout   HTN (hypertension)   Anemia in chronic kidney disease   OSA (obstructive sleep apnea)   GERD (gastroesophageal reflux disease)   Chronic diastolic HF (heart failure) (HCC)   Obesity, diabetes, and hypertension syndrome (HCC)   CAD (coronary artery disease)   Obesity (BMI 30-39.9)   Postoperative hypothyroidism   Hyperlipidemia LDL goal <130   History of TB (tuberculosis)   Carcinoma in situ of colon   End stage renal disease (HCC)   Neuropathy   Chest pain with concerns of multivessel CAD - Abnormal PET scan outpatient showing multivessel CAD.  Patient will require left heart catheterization.  LDL 55, A1c 5.5 -Echocardiogram-pending - Seen by cardiology, for now continue aspirin , beta-blocker, Imdur , further adjust as necessary  ESRD - Nephrology following  Essential hypertension; uncontrolled -Norvasc , Imdur , daily Lasix  IV as needed  Chronic diastolic CHF Last echocardiogram 2023 showing preserved EF, repeat echo has been ordered.  Volume managed by dialysis   Hyperlipidemia - Continue rosuvastatin    Hypothyroidism - Continue home Synthroid    Neuropathy - Continue gabapentin    Anemia, chronic Hemoglobin around baseline of 10   GERD - Continue home PPI   OSA Bedtime CPAP   Obesity OHS - Noted   DVT prophylaxis: heparin  injection 5,000 Units Start: 09/25/23 2200    Code Status: Full Code Family Communication:   Status is: Inpatient Admitted for cardiac  evaluation   Subjective:  No complaints at this time.   Examination:  General exam: Appears calm and comfortable  Respiratory system: Clear to auscultation. Respiratory effort normal. Cardiovascular system: S1 & S2 heard, RRR. No JVD, murmurs, rubs, gallops or clicks. No pedal edema. Gastrointestinal system: Abdomen is nondistended, soft and nontender. No organomegaly or masses felt. Normal bowel sounds heard. Central nervous system: Alert and oriented. No focal neurological deficits. Extremities: Symmetric 5 x 5 power. Skin: No rashes, lesions or ulcers Psychiatry: Judgement and insight appear normal. Mood & affect appropriate.                 Diet Orders (From admission, onward)     Start     Ordered   09/27/23 0927  Diet NPO time specified  Diet effective now        09/27/23 0926            Objective: Vitals:   09/26/23 2016 09/26/23 2334 09/27/23 0420 09/27/23 0812  BP: (!) 143/53 132/61 131/69 139/74  Pulse: 90  87 80  Resp: 18 18 18 18   Temp: 98.9 F (37.2 C) 98.4 F (36.9 C) 98.5 F (36.9 C) 98.1 F (36.7 C)  TempSrc: Oral Oral Oral Oral  SpO2: 100% 99% 99% 100%  Weight:      Height:       No intake or output data in the 24 hours ending 09/27/23 1048 Filed Weights   09/25/23 1229 09/25/23 2009  Weight: 127 kg 127.7 kg    Scheduled Meds:  amLODipine   5 mg Oral Daily   aspirin   81 mg Oral Pre-Cath  aspirin  EC  81 mg Oral Daily   calcitRIOL   4 mcg Oral Q M,W,F-HD   cinacalcet   90 mg Oral Q supper   furosemide   80 mg Oral Daily   gabapentin   300 mg Oral QHS   heparin   5,000 Units Subcutaneous Q8H   isosorbide  mononitrate  30 mg Oral Daily   levothyroxine   125 mcg Oral Q0600   magnesium  oxide  800 mg Oral Once   pantoprazole   40 mg Oral Daily   rosuvastatin   10 mg Oral Daily   sevelamer  carbonate  800 mg Oral TID WC   sodium chloride  flush  3 mL Intravenous Q12H   Continuous Infusions:  sodium chloride       Nutritional status      Body mass index is 36.15 kg/m.  Data Reviewed:   CBC: Recent Labs  Lab 09/25/23 1242 09/26/23 0414 09/27/23 0417  WBC 5.8 5.1 5.1  HGB 10.6* 10.5* 9.6*  HCT 32.6* 31.3* 29.0*  MCV 86.9 86.0 85.8  PLT 135* 128* 126*   Basic Metabolic Panel: Recent Labs  Lab 09/25/23 1242 09/25/23 1415 09/26/23 0414 09/27/23 0417  NA 129*  --  128* 127*  K 3.8  --  4.1 3.9  CL 86*  --  90* 90*  CO2 29  --  25 24  GLUCOSE 98  --  84 82  BUN 23  --  28* 40*  CREATININE 7.40*  --  8.05* 9.44*  CALCIUM  10.1  --  9.5 8.8*  MG  --  1.7  --  1.6*  PHOS  --   --   --  5.6*   GFR: Estimated Creatinine Clearance: 11.5 mL/min (A) (by C-G formula based on SCr of 9.44 mg/dL (H)). Liver Function Tests: Recent Labs  Lab 09/25/23 1415 09/26/23 0414  AST 16 13*  ALT 17 15  ALKPHOS 54 41  BILITOT 0.7 0.7  PROT 6.3* 5.6*  ALBUMIN  3.8 3.4*   Recent Labs  Lab 09/25/23 1415  LIPASE 38   No results for input(s): AMMONIA in the last 168 hours. Coagulation Profile: No results for input(s): INR, PROTIME in the last 168 hours. Cardiac Enzymes: No results for input(s): CKTOTAL, CKMB, CKMBINDEX, TROPONINI in the last 168 hours. BNP (last 3 results) No results for input(s): PROBNP in the last 8760 hours. HbA1C: Recent Labs    09/26/23 0833  HGBA1C 5.5   CBG: No results for input(s): GLUCAP in the last 168 hours. Lipid Profile: Recent Labs    09/26/23 0833  CHOL 105  HDL 32*  LDLCALC 55  TRIG 91  CHOLHDL 3.3   Thyroid  Function Tests: No results for input(s): TSH, T4TOTAL, FREET4, T3FREE, THYROIDAB in the last 72 hours. Anemia Panel: No results for input(s): VITAMINB12, FOLATE, FERRITIN, TIBC, IRON , RETICCTPCT in the last 72 hours. Sepsis Labs: No results for input(s): PROCALCITON, LATICACIDVEN in the last 168 hours.  No results found for this or any previous visit (from the past 240 hours).       Radiology Studies: DG Chest 2  View Result Date: 09/25/2023 CLINICAL DATA:  Chest pain with dizziness for a while. EXAM: CHEST - 2 VIEW COMPARISON:  Radiographs 06/17/2023 and 05/05/2023. CT 02/27/2015. Cardiac PET-CT 09/21/2023. FINDINGS: The heart size and mediastinal contours are stable with aortic atherosclerosis. The lungs are clear. There is no pleural effusion or pneumothorax. No acute osseous findings. IMPRESSION: No evidence of acute cardiopulmonary process. Aortic atherosclerosis. Electronically Signed   By: Elsie Gertrude HERO.D.  On: 09/25/2023 13:46           LOS: 2 days   Time spent= 35 mins    Burgess JAYSON Dare, MD [facility]  If 7PM-7AM, please contact night-coverage  09/27/2023, 10:48 AM

## 2023-09-27 NOTE — Progress Notes (Signed)
 Heart Failure Navigator Progress Note  Assessed for Heart & Vascular TOC clinic readiness.  Patient does not meet criteria due to ESRD on hemodialysis, No HF TOC. .   Navigator will sign off at this time.   Stephane Haddock, BSN, Scientist, clinical (histocompatibility and immunogenetics) Only

## 2023-09-27 NOTE — Progress Notes (Signed)
*  PRELIMINARY RESULTS* Echocardiogram 2D Echocardiogram has been performed.  Craig Flynn 09/27/2023, 11:24 AM

## 2023-09-28 ENCOUNTER — Other Ambulatory Visit (HOSPITAL_COMMUNITY): Payer: Self-pay

## 2023-09-28 ENCOUNTER — Encounter (HOSPITAL_COMMUNITY): Payer: Self-pay | Admitting: Cardiology

## 2023-09-28 DIAGNOSIS — R079 Chest pain, unspecified: Secondary | ICD-10-CM | POA: Diagnosis not present

## 2023-09-28 LAB — MAGNESIUM: Magnesium: 1.7 mg/dL (ref 1.7–2.4)

## 2023-09-28 LAB — BASIC METABOLIC PANEL WITH GFR
Anion gap: 12 (ref 5–15)
BUN: 46 mg/dL — ABNORMAL HIGH (ref 8–23)
CO2: 26 mmol/L (ref 22–32)
Calcium: 8.7 mg/dL — ABNORMAL LOW (ref 8.9–10.3)
Chloride: 89 mmol/L — ABNORMAL LOW (ref 98–111)
Creatinine, Ser: 10.89 mg/dL — ABNORMAL HIGH (ref 0.61–1.24)
GFR, Estimated: 5 mL/min — ABNORMAL LOW (ref >60)
Glucose, Bld: 82 mg/dL (ref 70–99)
Potassium: 4.5 mmol/L (ref 3.5–5.1)
Sodium: 127 mmol/L — ABNORMAL LOW (ref 135–145)

## 2023-09-28 LAB — HEPATITIS B SURFACE ANTIBODY, QUANTITATIVE: Hep B S AB Quant (Post): 454 m[IU]/mL

## 2023-09-28 LAB — CBC
HCT: 30.4 % — ABNORMAL LOW (ref 39.0–52.0)
Hemoglobin: 10 g/dL — ABNORMAL LOW (ref 13.0–17.0)
MCH: 28.9 pg (ref 26.0–34.0)
MCHC: 32.9 g/dL (ref 30.0–36.0)
MCV: 87.9 fL (ref 80.0–100.0)
Platelets: 147 K/uL — ABNORMAL LOW (ref 150–400)
RBC: 3.46 MIL/uL — ABNORMAL LOW (ref 4.22–5.81)
RDW: 13.9 % (ref 11.5–15.5)
WBC: 5.2 K/uL (ref 4.0–10.5)
nRBC: 0 % (ref 0.0–0.2)

## 2023-09-28 MED ORDER — CALCITRIOL 0.5 MCG PO CAPS
ORAL_CAPSULE | ORAL | Status: AC
  Filled 2023-09-28: qty 8

## 2023-09-28 MED ORDER — CINACALCET HCL 30 MG PO TABS: 90.0000 mg | ORAL_TABLET | Freq: Every day | ORAL | Status: AC

## 2023-09-28 MED ORDER — HEPARIN SODIUM (PORCINE) 1000 UNIT/ML IJ SOLN
INTRAMUSCULAR | Status: AC
  Filled 2023-09-28: qty 4

## 2023-09-28 MED ORDER — ISOSORBIDE MONONITRATE ER 60 MG PO TB24
60.0000 mg | ORAL_TABLET | Freq: Every day | ORAL | 0 refills | Status: DC
  Filled 2023-09-28: qty 30, 30d supply, fill #0

## 2023-09-28 MED ORDER — CLOPIDOGREL BISULFATE 75 MG PO TABS
75.0000 mg | ORAL_TABLET | Freq: Every day | ORAL | 3 refills | Status: DC
  Filled 2023-09-28: qty 90, 90d supply, fill #0

## 2023-09-28 MED ORDER — FUROSEMIDE 80 MG PO TABS
80.0000 mg | ORAL_TABLET | Freq: Every day | ORAL | 0 refills | Status: DC
  Filled 2023-09-28: qty 30, 30d supply, fill #0

## 2023-09-28 MED ORDER — AMLODIPINE BESYLATE 5 MG PO TABS
5.0000 mg | ORAL_TABLET | Freq: Every day | ORAL | 0 refills | Status: AC
  Filled 2023-09-28: qty 90, 90d supply, fill #0

## 2023-09-28 MED ORDER — CALCITRIOL 0.5 MCG PO CAPS: 4.0000 ug | ORAL_CAPSULE | ORAL | Status: AC

## 2023-09-28 NOTE — Discharge Planning (Signed)
 [facility] Patient Discharge Orders- [facility] CLINIC: [facility]  Patient's name: Craig Flynn Admit/DC Dates: 09/25/2023 - 09/28/2023  Discharge Diagnoses: Chest pain/CAD. S/p cardiac cath. Continue medical management    Recent Labs  Lab 09/27/23 1702 09/28/23 0547  HGB 10.3* 10.0*  K 4.7 4.5  CALCIUM  9.1 8.7*  PHOS 5.5*  --   ALBUMIN  3.6  --    Aranesp : Given: --   Date of last dose/amount: --   PRBC's Given: -- Date/# of units: -- ESA dose for discharge: Mircera 30 mcg IV q 2 weeks   Outpatient Dialysis Orders:  -Heparin : No change  -EDW No change   -[place]: No change   Access intervention/Change: --   Medication Orders: -IV Antibiotics:    OTHER/APPTS/LABS    Completed by: Maisie Ronnald Acosta PA-C   D/C Meds to be reconciled by nurse after every discharge.    Reviewed by: MD:______ RN_______

## 2023-09-28 NOTE — Progress Notes (Addendum)
 Received patient in bed to unit.  Alert and oriented.  Informed consent signed and in chart.   TX duration:3  Patient tolerated well.  Transported back to the room  Alert, without acute distress.  Hand-off given to patient's nurse.   Access used: avf Access issues: N/A  Total UF removed: 3.5 Medication(s) given: N/A Weight :126. 2KG   09/28/23 1219  Vitals  Temp 97.8 F (36.6 C)  BP (!) 168/69  MAP (mmHg) 98  Pulse Rate 78  ECG Heart Rate 85  Resp 13  Weight 126.2 kg  Type of Weight Post-Dialysis  Oxygen Therapy  SpO2 100 %  O2 Device Room Air  Patient Activity (if Appropriate) In bed  Pulse Oximetry Type Continuous  Oximetry Probe Site Changed No  During Treatment Monitoring  Blood Flow Rate (mL/min) 0 mL/min  Arterial Pressure (mmHg) 4.04 mmHg  Venous Pressure (mmHg) 23.84 mmHg  TMP (mmHg) 22.22 mmHg  Ultrafiltration Rate (mL/min) 1109 mL/min  Dialysate Flow Rate (mL/min) 299 ml/min  Duration of HD Treatment -hour(s) 3.75 hour(s)  Cumulative Fluid Removed (mL) per Treatment  3500.13  HD Safety Checks Performed Yes  Intra-Hemodialysis Comments Tx completed;Tolerated well  Post Treatment  Dialyzer Clearance Lightly streaked  Hemodialysis Intake (mL) 0 mL  Liters Processed 80.1  Fluid Removed (mL) 3500 mL  Tolerated HD Treatment Yes  AVG/AVF Arterial Site Held (minutes) 5 minutes  AVG/AVF Venous Site Held (minutes) 5 minutes  Fistula / Graft Left Upper arm Arteriovenous fistula  Placement Date/Time: 08/27/20 1056   Placed prior to admission: No  Orientation: Left  Access Location: Upper arm  Access Type: (c) Arteriovenous fistula  Site Condition No complications  Fistula / Graft Assessment Present;Bruit;Thrill      [facility]

## 2023-09-28 NOTE — Progress Notes (Signed)
 [facility] Progress Note   Subjective:  Seen in [facility]- roll over from yesterday due to high inpatient census. Feels well this am. Denies chest pain, sob.   Objective Vitals:   09/27/23 2024 09/27/23 2300 09/28/23 0416 09/28/23 0807  BP: (!) 163/72 (!) 143/67 (!) 149/64 (!) 158/60  Pulse: 82  82 72  Resp: 17 16 17 18   Temp: 97.9 F (36.6 C) 98 F (36.7 C) 98 F (36.7 C) 98.1 F (36.7 C)  TempSrc: Oral Oral Oral   SpO2: 98% 99% 99% 100%  Weight:    127.8 kg  Height:         Additional Objective Labs: Basic Metabolic Panel: Recent Labs  Lab 09/27/23 0417 09/27/23 1702 09/28/23 0547  NA 127* 128* 127*  K 3.9 4.7 4.5  CL 90* 90* 89*  CO2 24 27 26   GLUCOSE 82 82 82  BUN 40* 46* 46*  CREATININE 9.44* 10.34* 10.89*  CALCIUM  8.8* 9.1 8.7*  PHOS 5.6* 5.5*  --    CBC: Recent Labs  Lab 09/25/23 1242 09/26/23 0414 09/27/23 0417 09/27/23 1702 09/28/23 0547  WBC 5.8 5.1 5.1 5.2 5.2  HGB 10.6* 10.5* 9.6* 10.3* 10.0*  HCT 32.6* 31.3* 29.0* 31.4* 30.4*  MCV 86.9 86.0 85.8 86.5 87.9  PLT 135* 128* 126* 118* 147*   Blood Culture    Component Value Date/Time   SDES BLOOD LEFT HAND 11/17/2013 0840   SPECREQUEST BOTTLES DRAWN AEROBIC ONLY 4CC 11/17/2013 0840   CULT  11/17/2013 0840    NO GROWTH 5 DAYS Performed at [facility]   REPTSTATUS 11/23/2013 FINAL 11/17/2013 0840     Physical Exam General: Well appearing, nad Heart: RRR Lungs: Clear, Abdomen: non-tender Extremities: no LE edema  Dialysis Access: LUE AVF +bruit   Medications:  sodium chloride       amLODipine   5 mg Oral Daily   calcitRIOL   4 mcg Oral Q M,W,F-HD   cinacalcet   90 mg Oral Q supper   clopidogrel   75 mg Oral Daily   furosemide   80 mg Oral Daily   gabapentin   300 mg Oral QHS   heparin   3,600 Units Dialysis Once in dialysis   heparin   5,000 Units Subcutaneous Q8H   isosorbide  mononitrate  60 mg Oral Daily   levothyroxine   125 mcg Oral Q0600   pantoprazole   40 mg  Oral Daily   rosuvastatin   10 mg Oral Daily   sevelamer  carbonate  800 mg Oral TID WC   sodium chloride  flush  3 mL Intravenous Q12H    Dialysis Orders:  [facility] MWF 3:45 hrs 180NRe 450/500 121.4 kg 2.0 K/ 2.0 Ca AVF - Heparin  3600 units IV three times per week - Calicitriol 4.0 mcg PO three times per week - Mircera 30 mcg IV q 4 week (has not been started yet).   Assessment/Plan: Chest pain-work up per cards/primary. S/p cardiac cath 7/7--mild/mod CAD. No intervention. Continue medical therapy per cardiology.   ESRD -  Usual HD MWF. HD today, off schedule d/t high inpatent census. Back on schedule Wed.  Hypertension/volume  - Very hypertensive.on admit. BP much better now.   Continue medications. No excess volume by exam. UF as tolerated.  Hyponatremia: Chronic issue. Continue Furosemide  as ordered. Fluid restrictions, optimize volume with UF.  Anemia  - Hgb 9-10. Follow HGB.  Metabolic bone disease -  Continue sevelamer  binders, senispar, VDRA Nutrition - Albumin  low. Protein supplements. Dispo - ok for discharge from renal standpoint.  Maisie Ronnald Acosta PA-C [facility] 09/28/2023,8:49 AM

## 2023-09-28 NOTE — Progress Notes (Signed)
 Patient just got back from HD, D/C order in, vital signs checked and meds given. Patient asked to eat lunch first before leaving.

## 2023-09-28 NOTE — Discharge Summary (Signed)
 Physician Discharge Summary  Craig Flynn FMW:[id number] DOB: 02-07-1961 DOA: 09/25/2023  PCP: Norleen Agent ORN, MD  Admit date: 09/25/2023 Discharge date: 09/28/2023  Admitted From: Home Disposition: Home  Recommendations for Outpatient Follow-up:  Follow up with PCP in 1-2 weeks Please obtain BMP/CBC in one week your next doctors visit.  Discontinue aspirin , start Plavix .  Imdur  increased to 60 mg daily Norvasc  5 mg daily Discontinue NSAIDs and aspirin  Continue outpatient nephrology medications   Discharge Condition: Stable CODE STATUS: Full code Diet recommendation: Heart healthy  Brief/Interim Summary: Brief Narrative:   63 year old with history of HTN, HLD, ESRD, CAD, hypothyroidism, anemia, GERD, diastolic CHF, colon cancer, DVT, retinal artery thrombosis, OSA, pancreatitis, gout presented with chest pain.  PET/CT on 7/1 as outpatient showed multivessel CAD. Echocardiogram-EF 60%,  Left heart cath on 7/7 showed complete occlusion of RCA with good collateral/contra collateral.  Unsuccessful PCI.  At this time recommending switching aspirin  to Plavix , adjusting Imdur  to help with this chest pain.  Continue rest of the medical management per cardiology team Medically stable for discharge  Assessment & Plan:  Principal Problem:   Chest pain, rule out acute myocardial infarction Active Problems:   Gout   HTN (hypertension)   Anemia in chronic kidney disease   OSA (obstructive sleep apnea)   GERD (gastroesophageal reflux disease)   Chronic diastolic HF (heart failure) (HCC)   Obesity, diabetes, and hypertension syndrome (HCC)   CAD (coronary artery disease)   Obesity (BMI 30-39.9)   Postoperative hypothyroidism   Hyperlipidemia LDL goal <130   History of TB (tuberculosis)   Carcinoma in situ of colon   End stage renal disease (HCC)   Neuropathy   Chest pain with concerns of multivessel CAD - Abnormal PET scan outpatient showing multivessel CAD.   LDL 55, A1c  5.5 -Echocardiogram-EF 60% - Left heart cath on 7/7 showed complete occlusion of RCA with good collateral/contra collateral.  Unsuccessful PCI.  At this time recommending switching aspirin  to Plavix , adjusting Imdur  to help with this chest pain.  Continue rest of the medical management per cardiology team  ESRD - Nephrology following  Essential hypertension; uncontrolled -Norvasc , Imdur , daily Lasix    Chronic diastolic CHF Last echocardiogram 2023 showing preserved EF, repeat echo has been ordered.  Volume managed by dialysis   Hyperlipidemia - Continue rosuvastatin    Hypothyroidism - Continue home Synthroid    Neuropathy - Continue gabapentin    Anemia, chronic Hemoglobin around baseline of 10   GERD - Continue home PPI   OSA Bedtime CPAP   Obesity OHS - Noted   DVT prophylaxis: heparin  injection 5,000 Units Start: 09/25/23 2200    Code Status: Full Code Family Communication:   Status is: Inpatient Discharge today Subjective: Patient doing well no complaints.  Examination:  General exam: Appears calm and comfortable  Respiratory system: Clear to auscultation. Respiratory effort normal. Cardiovascular system: S1 & S2 heard, RRR. No JVD, murmurs, rubs, gallops or clicks. No pedal edema. Gastrointestinal system: Abdomen is nondistended, soft and nontender. No organomegaly or masses felt. Normal bowel sounds heard. Central nervous system: Alert and oriented. No focal neurological deficits. Extremities: Symmetric 5 x 5 power. Skin: No rashes, lesions or ulcers Psychiatry: Judgement and insight appear normal. Mood & affect appropriate.     Discharge Diagnoses:  Principal Problem:   Chest pain, rule out acute myocardial infarction Active Problems:   Gout   HTN (hypertension)   Anemia in chronic kidney disease   OSA (obstructive sleep apnea)   GERD (gastroesophageal  reflux disease)   Chronic diastolic HF (heart failure) (HCC)   Obesity, diabetes, and  hypertension syndrome (HCC)   CAD (coronary artery disease)   Obesity (BMI 30-39.9)   Postoperative hypothyroidism   Hyperlipidemia LDL goal <130   History of TB (tuberculosis)   Carcinoma in situ of colon   End stage renal disease (HCC)   Neuropathy      Discharge Exam: Vitals:   09/28/23 1219 09/28/23 1222  BP: (!) 168/69 (!) 157/66  Pulse: 78 81  Resp: 13 16  Temp: 97.8 F (36.6 C) 97.8 F (36.6 C)  SpO2: 100% 100%   Vitals:   09/28/23 1200 09/28/23 1206 09/28/23 1219 09/28/23 1222  BP: (!) 168/68 (!) 168/68 (!) 168/69 (!) 157/66  Pulse: 81 76 78 81  Resp: 17 16 13 16   Temp:   97.8 F (36.6 C) 97.8 F (36.6 C)  TempSrc:      SpO2: 99% 99% 100% 100%  Weight:   126.2 kg 126.2 kg  Height:          Discharge Instructions  Discharge Instructions     AMB referral to Phase II Cardiac Rehabilitation   Complete by: As directed    Diagnosis: Stable Angina   After initial evaluation and assessments completed: Virtual Based Care may be provided alone or in conjunction with Phase 2 Cardiac Rehab based on patient barriers.: Yes   Intensive Cardiac Rehabilitation (ICR) [facility] location only OR Traditional Cardiac Rehabilitation (TCR) *If criteria for ICR are not met will enroll in TCR ([facility] only): Yes      Allergies as of 09/28/2023   No Known Allergies      Medication List     STOP taking these medications    aspirin  EC 81 MG tablet   ibuprofen  800 MG tablet Commonly known as: ADVIL        TAKE these medications    acetaminophen  500 MG tablet Commonly known as: TYLENOL  Take 1,000 mg by mouth daily as needed for moderate pain (pain score 4-6) or mild pain (pain score 1-3).   albuterol  108 (90 Base) MCG/ACT inhaler Commonly known as: VENTOLIN  HFA Inhale 2 puffs into the lungs every 6 (six) hours as needed for wheezing or shortness of breath.   amLODipine  5 MG tablet Commonly known as: NORVASC  Take 1 tablet (5 mg total) by mouth daily. What changed:   medication strength how much to take   b complex-vitamin c-folic acid  0.8 MG Tabs tablet Take 1 tablet by mouth daily.   calcitRIOL  0.5 MCG capsule Commonly known as: ROCALTROL  Take 8 capsules (4 mcg total) by mouth every Monday, Wednesday, and Friday with hemodialysis. Start taking on: September 29, 2023   cinacalcet  30 MG tablet Commonly known as: SENSIPAR  Take 3 tablets (90 mg total) by mouth daily with supper.   clopidogrel  75 MG tablet Commonly known as: Plavix  Take 1 tablet (75 mg total) by mouth daily.   cyanocobalamin  1000 MCG tablet Commonly known as: VITAMIN B12 Take 1,000 mcg by mouth daily.   furosemide  80 MG tablet Commonly known as: LASIX  Take 1 tablet (80 mg total) by mouth daily. What changed: how much to take   gabapentin  100 MG capsule Commonly known as: NEURONTIN  Take 100 mg by mouth at bedtime.   isosorbide  mononitrate 60 MG 24 hr tablet Commonly known as: IMDUR  Take 1 tablet (60 mg total) by mouth daily. What changed:  medication strength how much to take how to take this when to take this additional  instructions   latanoprost 0.005 % ophthalmic solution Commonly known as: XALATAN Place 1 drop into the right eye at bedtime.   levothyroxine  125 MCG tablet Commonly known as: SYNTHROID  TAKE 1 TABLET(125 MCG) BY MOUTH EVERY MORNING   nitroGLYCERIN  0.4 MG SL tablet Commonly known as: NITROSTAT  DISSOLVE 1 TABLET UNDER THE TONGUE EVERY 5 MINUTES AS NEEDED FOR CHEST PAIN What changed: See the new instructions.   pantoprazole  40 MG tablet Commonly known as: PROTONIX  Take 1 tablet (40 mg total) by mouth daily. What changed: how much to take   sevelamer  carbonate 800 MG tablet Commonly known as: RENVELA  Take 800 mg by mouth in the morning and at bedtime.        No Known Allergies  You were cared for by a hospitalist during your hospital stay. If you have any questions about your discharge medications or the care you received while you were  in the hospital after you are discharged, you can call the unit and asked to speak with the hospitalist on call if the hospitalist that took care of you is not available. Once you are discharged, your primary care physician will handle any further medical issues. Please note that no refills for any discharge medications will be authorized once you are discharged, as it is imperative that you return to your primary care physician (or establish a relationship with a primary care physician if you do not have one) for your aftercare needs so that they can reassess your need for medications and monitor your lab values.  You were cared for by a hospitalist during your hospital stay. If you have any questions about your discharge medications or the care you received while you were in the hospital after you are discharged, you can call the unit and asked to speak with the hospitalist on call if the hospitalist that took care of you is not available. Once you are discharged, your primary care physician will handle any further medical issues. Please note that NO REFILLS for any discharge medications will be authorized once you are discharged, as it is imperative that you return to your primary care physician (or establish a relationship with a primary care physician if you do not have one) for your aftercare needs so that they can reassess your need for medications and monitor your lab values.  Please request your Prim.MD to go over all Hospital Tests and Procedure/Radiological results at the follow up, please get all Hospital records sent to your Prim MD by signing hospital release before you go home.  Get CBC, CMP, 2 view Chest X ray checked  by Primary MD during your next visit or SNF MD in 5-7 days ( we routinely change or add medications that can affect your baseline labs and fluid status, therefore we recommend that you get the mentioned basic workup next visit with your PCP, your PCP may decide not to get them or  add new tests based on their clinical decision)  On your next visit with your primary care physician please Get Medicines reviewed and adjusted.  If you experience worsening of your admission symptoms, develop shortness of breath, life threatening emergency, suicidal or homicidal thoughts you must seek medical attention immediately by calling 911 or calling your MD immediately  if symptoms less severe.  You Must read complete instructions/literature along with all the possible adverse reactions/side effects for all the Medicines you take and that have been prescribed to you. Take any new Medicines after you have completely understood  and accpet all the possible adverse reactions/side effects.   Do not drive, operate heavy machinery, perform activities at heights, swimming or participation in water  activities or provide baby sitting services if your were admitted for syncope or siezures until you have seen by Primary MD or a Neurologist and advised to do so again.  Do not drive when taking Pain medications.   Procedures/Studies: CARDIAC CATHETERIZATION Result Date: 09/27/2023 Images from the original result were not included.   Mid Cx lesion is 30% stenosed. Cardiac Catheterization 09/27/23: Hemodynamic data: LVEDP 15 mmHg.  There is no pressure gradient across the aortic valve. Angiographic data: LM: Calcified, large caliber vessel. LAD: Very large caliber vessel, there is mild tortuosity in the midsegment.  LAD has mild to moderate diffuse coronary calcification.  There is no significant luminal irregularity. RI: Large-caliber vessel.  Again proximal and mid segment shows significant coronary calcification.  No luminal irregularity. LCx: Very large caliber vessel, gives origin to a large very large OM1 and continues as a large OM 2.  After the origin of OM1, there is a 30% luminal stenosis.  Otherwise there is mild coronary calcification noted in the proximal and mid segment. RCA: Moderate caliber  vessel.  Again moderate coronary calcification is evident.  Proximal RCA is occluded, CTO with bridging collaterals and also has faint contralateral collaterals from the LAD. Intervention data: Unsuccessful attempt at crossing the CTO in spite of utilization of whisper guidewire, [employer] 6 Gram weighted wire with 1 mm balloon support. Unable to cross the distal cap. Impression and recommendations:  In view of presence of collaterals both bridging collaterals and contralateral collaterals, small area distribution,  we could continue medical therapy, we will review the angiograms with colleagues as well.  Discontinue aspirin  and switch him to Plavix  for long-term antiplatelet regimen.  Increase Imdur  from 30 mg to 60 mg daily.  Discharge home once stable from medical standpoint.   ECHOCARDIOGRAM COMPLETE Result Date: 09/27/2023    ECHOCARDIOGRAM REPORT   Patient Name:   Craig Flynn Date of Exam: 09/27/2023 Medical Rec #:  [id number]     Height:       74.0 in Accession #:    [id number]    Weight:       281.5 lb Date of Birth:  August 23, 1960    BSA:          2.513 m Patient Age:    62 years      BP:           139/74 mmHg Patient Gender: M             HR:           75 bpm. Exam Location:  Inpatient Procedure: 2D Echo, Cardiac Doppler and Color Doppler (Both Spectral and Color            Flow Doppler were utilized during procedure). Indications:    Cardiomyopathy-Unspecified I42.9  History:        Patient has prior history of Echocardiogram examinations, most                 recent 07/03/2021. Risk Factors:Hypertension.  Sonographer:    Benard Stallion Referring Phys: Jerolene Kupfer C Luis Sami IMPRESSIONS  1. Left ventricular ejection fraction, by estimation, is 55 to 60%. The left ventricle has normal function. The left ventricle has no regional wall motion abnormalities. There is mild left ventricular hypertrophy. Left ventricular diastolic parameters are consistent with Grade I diastolic dysfunction (impaired relaxation).  2. Right ventricular systolic function is normal. The right ventricular size is normal.  3. Left atrial size was moderately dilated.  4. The mitral valve is abnormal. Trivial mitral valve regurgitation. No evidence of mitral stenosis.  5. The aortic valve is tricuspid. There is mild calcification of the aortic valve. There is mild thickening of the aortic valve. Aortic valve regurgitation is not visualized. Aortic valve sclerosis is present, with no evidence of aortic valve stenosis.  6. The inferior vena cava is normal in size with greater than 50% respiratory variability, suggesting right atrial pressure of 3 mmHg. FINDINGS  Left Ventricle: Left ventricular ejection fraction, by estimation, is 55 to 60%. The left ventricle has normal function. The left ventricle has no regional wall motion abnormalities. Strain was performed and the global longitudinal strain is indeterminate. The left ventricular internal cavity size was normal in size. There is mild left ventricular hypertrophy. Left ventricular diastolic parameters are consistent with Grade I diastolic dysfunction (impaired relaxation). Right Ventricle: The right ventricular size is normal. No increase in right ventricular wall thickness. Right ventricular systolic function is normal. Left Atrium: Left atrial size was moderately dilated. Right Atrium: Right atrial size was normal in size. Pericardium: There is no evidence of pericardial effusion. Mitral Valve: The mitral valve is abnormal. There is mild thickening of the mitral valve leaflet(s). There is mild calcification of the mitral valve leaflet(s). Trivial mitral valve regurgitation. No evidence of mitral valve stenosis. Tricuspid Valve: The tricuspid valve is normal in structure. Tricuspid valve regurgitation is trivial. No evidence of tricuspid stenosis. Aortic Valve: The aortic valve is tricuspid. There is mild calcification of the aortic valve. There is mild thickening of the aortic valve. Aortic  valve regurgitation is not visualized. Aortic valve sclerosis is present, with no evidence of aortic valve stenosis. Aortic valve mean gradient measures 3.5 mmHg. Aortic valve peak gradient measures 6.9 mmHg. Aortic valve area, by VTI measures 2.94 cm. Pulmonic Valve: The pulmonic valve was normal in structure. Pulmonic valve regurgitation is not visualized. No evidence of pulmonic stenosis. Aorta: The aortic root is normal in size and structure. Venous: The inferior vena cava is normal in size with greater than 50% respiratory variability, suggesting right atrial pressure of 3 mmHg. IAS/Shunts: No atrial level shunt detected by color flow Doppler. Additional Comments: 3D was performed not requiring image post processing on an independent workstation and was indeterminate.  LEFT VENTRICLE PLAX 2D LVIDd:         5.10 cm LVIDs:         4.10 cm LV PW:         1.20 cm LV IVS:        1.20 cm LVOT diam:     2.30 cm LV SV:         83 LV SV Index:   33 LVOT Area:     4.15 cm  LV Volumes (MOD) LV vol d, MOD A2C: 137.0 ml LV vol d, MOD A4C: 126.0 ml LV vol s, MOD A2C: 58.6 ml LV vol s, MOD A4C: 59.8 ml LV SV MOD A2C:     78.4 ml LV SV MOD A4C:     126.0 ml LV SV MOD BP:      74.0 ml RIGHT VENTRICLE RV Basal diam:  2.80 cm RV Mid diam:    2.70 cm RV S prime:     16.10 cm/s TAPSE (M-mode): 2.4 cm LEFT ATRIUM  Index        RIGHT ATRIUM          Index LA diam:        4.70 cm 1.87 cm/m   RA Area:     8.80 cm LA Vol (A2C):   79.6 ml 31.67 ml/m  RA Volume:   15.30 ml 6.09 ml/m LA Vol (A4C):   66.4 ml 26.42 ml/m LA Biplane Vol: 72.9 ml 29.00 ml/m  AORTIC VALVE AV Area (Vmax):    2.69 cm AV Area (Vmean):   2.75 cm AV Area (VTI):     2.94 cm AV Vmax:           131.50 cm/s AV Vmean:          88.400 cm/s AV VTI:            0.283 m AV Peak Grad:      6.9 mmHg AV Mean Grad:      3.5 mmHg LVOT Vmax:         85.20 cm/s LVOT Vmean:        58.450 cm/s LVOT VTI:          0.200 m LVOT/AV VTI ratio: 0.71  AORTA Ao Root diam:  3.20 cm Ao Asc diam:  3.20 cm MITRAL VALVE MV Area (PHT): 4.68 cm    SHUNTS MV Decel Time: 162 msec    Systemic VTI:  0.20 m MV E velocity: 51.40 cm/s  Systemic Diam: 2.30 cm MV A velocity: 69.70 cm/s MV E/A ratio:  0.74 Maude Emmer MD Electronically signed by Maude Emmer MD Signature Date/Time: 09/27/2023/11:52:03 AM    Final    DG Chest 2 View Result Date: 09/25/2023 CLINICAL DATA:  Chest pain with dizziness for a while. EXAM: CHEST - 2 VIEW COMPARISON:  Radiographs 06/17/2023 and 05/05/2023. CT 02/27/2015. Cardiac PET-CT 09/21/2023. FINDINGS: The heart size and mediastinal contours are stable with aortic atherosclerosis. The lungs are clear. There is no pleural effusion or pneumothorax. No acute osseous findings. IMPRESSION: No evidence of acute cardiopulmonary process. Aortic atherosclerosis. Electronically Signed   By: Elsie Perone M.D.   On: 09/25/2023 13:46   NM PET CT CARDIAC PERFUSION MULTI W/ABSOLUTE BLOODFLOW Result Date: 09/21/2023   Findings are consistent with ischemia due to multivessel coronary disease, involving all coronary territories and more pronounced in the right coronary artery distribution. Less likely, the abnormalities could be due to microvascular dysfunction. The study is high risk.   Rest left ventricular function is abnormal. Rest global function is mildly reduced with mild global hypokinesis. There was a single regional abnormality that developed during stress, in the basal inferior wall. Rest EF: 47%. Stress left ventricular function is abnormal. Stress global function is mildly reduced. There was a single regional abnormality. Stress EF: 51%. End diastolic cavity size is normal. End systolic cavity size is normal.   Myocardial blood flow was computed to be 0.26ml/g/min at rest and 1.29ml/g/min at stress. Global myocardial blood flow reserve was 1.34 and was abnormal.   Coronary calcium  was present on the attenuation correction CT images. Severe coronary calcifications were  present. Coronary calcifications were present in the left anterior descending artery, left circumflex artery and right coronary artery distribution(s).   Electronically signed by Jerel Balding, MD CLINICAL DATA:  This over-read does not include interpretation of cardiac or coronary anatomy or pathology. No interpretation the PET data set. The cardiac PET-CT interpretation by the cardiologist is attached. COMPARISON:  None Available. FINDINGS: Limited view of the lung  parenchyma demonstrates no suspicious nodularity. Airways are normal. Limited view of the mediastinum demonstrates no adenopathy. Esophagus normal. Limited view of the upper abdomen unremarkable. Limited view of the skeleton and chest wall is unremarkable. IMPRESSION: No significant extracardiac findings. Electronically Signed   By: Jackquline Boxer M.D.   On: 09/21/2023 09:56    The results of significant diagnostics from this hospitalization (including imaging, microbiology, ancillary and laboratory) are listed below for reference.     Microbiology: Recent Results (from the past 240 hours)  MRSA Next Gen by PCR, Nasal     Status: None   Collection Time: 09/27/23  7:59 AM   Specimen: Nasal Mucosa; Nasal Swab  Result Value Ref Range Status   MRSA by PCR Next Gen NOT DETECTED NOT DETECTED Final    Comment: (NOTE) The GeneXpert MRSA Assay (FDA approved for NASAL specimens only), is one component of a comprehensive MRSA colonization surveillance program. It is not intended to diagnose MRSA infection nor to guide or monitor treatment for MRSA infections. Test performance is not FDA approved in patients less than 52 years old. Performed at [facility], [address], [place], [place] [address]      Labs: BNP (last 3 results) Recent Labs    09/25/23 1415  BNP 276.6*   Basic Metabolic Panel: Recent Labs  Lab 09/25/23 1242 09/25/23 1415 09/26/23 0414 09/27/23 0417 09/27/23 1702 09/28/23 0547  NA 129*  --  128*  127* 128* 127*  K 3.8  --  4.1 3.9 4.7 4.5  CL 86*  --  90* 90* 90* 89*  CO2 29  --  25 24 27 26   GLUCOSE 98  --  84 82 82 82  BUN 23  --  28* 40* 46* 46*  CREATININE 7.40*  --  8.05* 9.44* 10.34* 10.89*  CALCIUM  10.1  --  9.5 8.8* 9.1 8.7*  MG  --  1.7  --  1.6*  --  1.7  PHOS  --   --   --  5.6* 5.5*  --    Liver Function Tests: Recent Labs  Lab 09/25/23 1415 09/26/23 0414 09/27/23 1702  AST 16 13*  --   ALT 17 15  --   ALKPHOS 54 41  --   BILITOT 0.7 0.7  --   PROT 6.3* 5.6*  --   ALBUMIN  3.8 3.4* 3.6   Recent Labs  Lab 09/25/23 1415  LIPASE 38   No results for input(s): AMMONIA in the last 168 hours. CBC: Recent Labs  Lab 09/25/23 1242 09/26/23 0414 09/27/23 0417 09/27/23 1702 09/28/23 0547  WBC 5.8 5.1 5.1 5.2 5.2  HGB 10.6* 10.5* 9.6* 10.3* 10.0*  HCT 32.6* 31.3* 29.0* 31.4* 30.4*  MCV 86.9 86.0 85.8 86.5 87.9  PLT 135* 128* 126* 118* 147*   Cardiac Enzymes: No results for input(s): CKTOTAL, CKMB, CKMBINDEX, TROPONINI in the last 168 hours. BNP: Invalid input(s): POCBNP CBG: No results for input(s): GLUCAP in the last 168 hours. D-Dimer No results for input(s): DDIMER in the last 72 hours. Hgb A1c Recent Labs    09/26/23 0833  HGBA1C 5.5   Lipid Profile Recent Labs    09/26/23 0833  CHOL 105  HDL 32*  LDLCALC 55  TRIG 91  CHOLHDL 3.3   Thyroid  function studies No results for input(s): TSH, T4TOTAL, T3FREE, THYROIDAB in the last 72 hours.  Invalid input(s): FREET3 Anemia work up No results for input(s): VITAMINB12, FOLATE, FERRITIN, TIBC, IRON , RETICCTPCT in the  last 72 hours. Urinalysis    Component Value Date/Time   COLORURINE STRAW (A) 09/25/2023 1527   APPEARANCEUR CLEAR 09/25/2023 1527   LABSPEC 1.004 (L) 09/25/2023 1527   PHURINE 8.0 09/25/2023 1527   GLUCOSEU NEGATIVE 09/25/2023 1527   HGBUR NEGATIVE 09/25/2023 1527   HGBUR negative 05/16/2008 0926   BILIRUBINUR NEGATIVE 09/25/2023  1527   KETONESUR NEGATIVE 09/25/2023 1527   PROTEINUR 100 (A) 09/25/2023 1527   UROBILINOGEN 0.2 11/17/2013 0748   NITRITE NEGATIVE 09/25/2023 1527   LEUKOCYTESUR NEGATIVE 09/25/2023 1527   Sepsis Labs Recent Labs  Lab 09/26/23 0414 09/27/23 0417 09/27/23 1702 09/28/23 0547  WBC 5.1 5.1 5.2 5.2   Microbiology Recent Results (from the past 240 hours)  MRSA Next Gen by PCR, Nasal     Status: None   Collection Time: 09/27/23  7:59 AM   Specimen: Nasal Mucosa; Nasal Swab  Result Value Ref Range Status   MRSA by PCR Next Gen NOT DETECTED NOT DETECTED Final    Comment: (NOTE) The GeneXpert MRSA Assay (FDA approved for NASAL specimens only), is one component of a comprehensive MRSA colonization surveillance program. It is not intended to diagnose MRSA infection nor to guide or monitor treatment for MRSA infections. Test performance is not FDA approved in patients less than 77 years old. Performed at [facility], [address] [address], [place], [place] [address]      Time coordinating discharge:  I have spent 35 minutes face to face with the patient and on the ward discussing the patients care, assessment, plan and disposition with other care givers. >50% of the time was devoted counseling the patient about the risks and benefits of treatment/Discharge disposition and coordinating care.   SIGNED:   Burgess JAYSON Dare, MD  [facility] 09/28/2023, 12:57 PM   If 7PM-7AM, please contact night-coverage

## 2023-09-28 NOTE — Progress Notes (Signed)
 D/C order noted. Contacted [facility] on [employer] to be advised of pt's d/c today and that pt should resume care tomorrow.   Randine Mungo Renal Navigator [phone]

## 2023-09-28 NOTE — Progress Notes (Signed)
 Progress Note  Patient Name: Craig Flynn Date of Encounter: 09/28/2023  Primary Cardiologist:   Agent Schilling, MD   Subjective   Denies chest pain or SOB.      Inpatient Medications    Scheduled Meds:  amLODipine   5 mg Oral Daily   calcitRIOL   4 mcg Oral Q M,W,F-HD   cinacalcet   90 mg Oral Q supper   clopidogrel   75 mg Oral Daily   furosemide   80 mg Oral Daily   gabapentin   300 mg Oral QHS   heparin   3,600 Units Dialysis Once in dialysis   heparin   5,000 Units Subcutaneous Q8H   isosorbide  mononitrate  60 mg Oral Daily   levothyroxine   125 mcg Oral Q0600   pantoprazole   40 mg Oral Daily   rosuvastatin   10 mg Oral Daily   sevelamer  carbonate  800 mg Oral TID WC   sodium chloride  flush  3 mL Intravenous Q12H   Continuous Infusions:  sodium chloride      PRN Meds: sodium chloride , acetaminophen  **OR** acetaminophen , feeding supplement (NEPRO CARB STEADY), glucagon  (human recombinant), hydrALAZINE , ipratropium-albuterol , lidocaine  (PF), lidocaine -prilocaine , metoprolol  tartrate, pentafluoroprop-tetrafluoroeth, polyethylene glycol, sodium chloride  flush   Vital Signs    Vitals:   09/28/23 0416 09/28/23 0807 09/28/23 0817 09/28/23 0837  BP: (!) 149/64 (!) 158/60 (!) 126/52 (!) 162/64  Pulse: 82 72    Resp: 17 18 13 12   Temp: 98 F (36.7 C) 98.1 F (36.7 C)    TempSrc: Oral     SpO2: 99% 100% 100%   Weight:  127.8 kg    Height:        Intake/Output Summary (Last 24 hours) at 09/28/2023 0917 Last data filed at 09/28/2023 0300 Gross per 24 hour  Intake 3 ml  Output 300 ml  Net -297 ml   Filed Weights   09/25/23 1229 09/25/23 2009 09/28/23 0807  Weight: 127 kg 127.7 kg 127.8 kg    Telemetry    NSR  - Personally Reviewed  ECG    NA - Personally Reviewed  Physical Exam   GEN: No  acute distress.   Neck: No  JVD Cardiac: RRR, no murmurs, rubs, or gallops.  Respiratory: Clear   to auscultation bilaterally. GI: Soft, nontender, non-distended, normal  bowel sounds  MS:  No edema; No deformity.  Left femoral without bleeding or bruising  Neuro:   Nonfocal  Psych: Oriented and appropriate     Labs    Chemistry Recent Labs  Lab 09/25/23 1415 09/26/23 0414 09/27/23 0417 09/27/23 1702 09/28/23 0547  NA  --  128* 127* 128* 127*  K  --  4.1 3.9 4.7 4.5  CL  --  90* 90* 90* 89*  CO2  --  25 24 27 26   GLUCOSE  --  84 82 82 82  BUN  --  28* 40* 46* 46*  CREATININE  --  8.05* 9.44* 10.34* 10.89*  CALCIUM   --  9.5 8.8* 9.1 8.7*  PROT 6.3* 5.6*  --   --   --   ALBUMIN  3.8 3.4*  --  3.6  --   AST 16 13*  --   --   --   ALT 17 15  --   --   --   ALKPHOS 54 41  --   --   --   BILITOT 0.7 0.7  --   --   --   GFRNONAA  --  7* 6* 5* 5*  ANIONGAP  --  13 13 11 12      Hematology Recent Labs  Lab 09/27/23 0417 09/27/23 1702 09/28/23 0547  WBC 5.1 5.2 5.2  RBC 3.38* 3.63* 3.46*  HGB 9.6* 10.3* 10.0*  HCT 29.0* 31.4* 30.4*  MCV 85.8 86.5 87.9  MCH 28.4 28.4 28.9  MCHC 33.1 32.8 32.9  RDW 13.8 13.7 13.9  PLT 126* 118* 147*    Cardiac EnzymesNo results for input(s): TROPONINI in the last 168 hours. No results for input(s): TROPIPOC in the last 168 hours.   BNP Recent Labs  Lab 09/25/23 1415  BNP 276.6*     DDimer No results for input(s): DDIMER in the last 168 hours.   Radiology    CARDIAC CATHETERIZATION Result Date: 09/27/2023 Images from the original result were not included.   Mid Cx lesion is 30% stenosed. Cardiac Catheterization 09/27/23: Hemodynamic data: LVEDP 15 mmHg.  There is no pressure gradient across the aortic valve. Angiographic data: LM: Calcified, large caliber vessel. LAD: Very large caliber vessel, there is mild tortuosity in the midsegment.  LAD has mild to moderate diffuse coronary calcification.  There is no significant luminal irregularity. RI: Large-caliber vessel.  Again proximal and mid segment shows significant coronary calcification.  No luminal irregularity. LCx: Very large caliber vessel,  gives origin to a large very large OM1 and continues as a large OM 2.  After the origin of OM1, there is a 30% luminal stenosis.  Otherwise there is mild coronary calcification noted in the proximal and mid segment. RCA: Moderate caliber vessel.  Again moderate coronary calcification is evident.  Proximal RCA is occluded, CTO with bridging collaterals and also has faint contralateral collaterals from the LAD. Intervention data: Unsuccessful attempt at crossing the CTO in spite of utilization of whisper guidewire, [employer] 6 Gram weighted wire with 1 mm balloon support. Unable to cross the distal cap. Impression and recommendations:  In view of presence of collaterals both bridging collaterals and contralateral collaterals, small area distribution,  we could continue medical therapy, we will review the angiograms with colleagues as well.  Discontinue aspirin  and switch him to Plavix  for long-term antiplatelet regimen.  Increase Imdur  from 30 mg to 60 mg daily.  Discharge home once stable from medical standpoint.   ECHOCARDIOGRAM COMPLETE Result Date: 09/27/2023    ECHOCARDIOGRAM REPORT   Patient Name:   Craig Flynn Date of Exam: 09/27/2023 Medical Rec #:  [id number]     Height:       74.0 in Accession #:    [id number]    Weight:       281.5 lb Date of Birth:  1960-10-07    BSA:          2.513 m Patient Age:    62 years      BP:           139/74 mmHg Patient Gender: M             HR:           75 bpm. Exam Location:  Inpatient Procedure: 2D Echo, Cardiac Doppler and Color Doppler (Both Spectral and Color            Flow Doppler were utilized during procedure). Indications:    Cardiomyopathy-Unspecified I42.9  History:        Patient has prior history of Echocardiogram examinations, most                 recent 07/03/2021. Risk Factors:Hypertension.  Sonographer:    Georvis  Evern Referring Phys: ANKIT C AMIN IMPRESSIONS  1. Left ventricular ejection fraction, by estimation, is 55 to 60%. The left ventricle  has normal function. The left ventricle has no regional wall motion abnormalities. There is mild left ventricular hypertrophy. Left ventricular diastolic parameters are consistent with Grade I diastolic dysfunction (impaired relaxation).  2. Right ventricular systolic function is normal. The right ventricular size is normal.  3. Left atrial size was moderately dilated.  4. The mitral valve is abnormal. Trivial mitral valve regurgitation. No evidence of mitral stenosis.  5. The aortic valve is tricuspid. There is mild calcification of the aortic valve. There is mild thickening of the aortic valve. Aortic valve regurgitation is not visualized. Aortic valve sclerosis is present, with no evidence of aortic valve stenosis.  6. The inferior vena cava is normal in size with greater than 50% respiratory variability, suggesting right atrial pressure of 3 mmHg. FINDINGS  Left Ventricle: Left ventricular ejection fraction, by estimation, is 55 to 60%. The left ventricle has normal function. The left ventricle has no regional wall motion abnormalities. Strain was performed and the global longitudinal strain is indeterminate. The left ventricular internal cavity size was normal in size. There is mild left ventricular hypertrophy. Left ventricular diastolic parameters are consistent with Grade I diastolic dysfunction (impaired relaxation). Right Ventricle: The right ventricular size is normal. No increase in right ventricular wall thickness. Right ventricular systolic function is normal. Left Atrium: Left atrial size was moderately dilated. Right Atrium: Right atrial size was normal in size. Pericardium: There is no evidence of pericardial effusion. Mitral Valve: The mitral valve is abnormal. There is mild thickening of the mitral valve leaflet(s). There is mild calcification of the mitral valve leaflet(s). Trivial mitral valve regurgitation. No evidence of mitral valve stenosis. Tricuspid Valve: The tricuspid valve is normal in  structure. Tricuspid valve regurgitation is trivial. No evidence of tricuspid stenosis. Aortic Valve: The aortic valve is tricuspid. There is mild calcification of the aortic valve. There is mild thickening of the aortic valve. Aortic valve regurgitation is not visualized. Aortic valve sclerosis is present, with no evidence of aortic valve stenosis. Aortic valve mean gradient measures 3.5 mmHg. Aortic valve peak gradient measures 6.9 mmHg. Aortic valve area, by VTI measures 2.94 cm. Pulmonic Valve: The pulmonic valve was normal in structure. Pulmonic valve regurgitation is not visualized. No evidence of pulmonic stenosis. Aorta: The aortic root is normal in size and structure. Venous: The inferior vena cava is normal in size with greater than 50% respiratory variability, suggesting right atrial pressure of 3 mmHg. IAS/Shunts: No atrial level shunt detected by color flow Doppler. Additional Comments: 3D was performed not requiring image post processing on an independent workstation and was indeterminate.  LEFT VENTRICLE PLAX 2D LVIDd:         5.10 cm LVIDs:         4.10 cm LV PW:         1.20 cm LV IVS:        1.20 cm LVOT diam:     2.30 cm LV SV:         83 LV SV Index:   33 LVOT Area:     4.15 cm  LV Volumes (MOD) LV vol d, MOD A2C: 137.0 ml LV vol d, MOD A4C: 126.0 ml LV vol s, MOD A2C: 58.6 ml LV vol s, MOD A4C: 59.8 ml LV SV MOD A2C:     78.4 ml LV SV MOD A4C:  126.0 ml LV SV MOD BP:      74.0 ml RIGHT VENTRICLE RV Basal diam:  2.80 cm RV Mid diam:    2.70 cm RV S prime:     16.10 cm/s TAPSE (M-mode): 2.4 cm LEFT ATRIUM             Index        RIGHT ATRIUM          Index LA diam:        4.70 cm 1.87 cm/m   RA Area:     8.80 cm LA Vol (A2C):   79.6 ml 31.67 ml/m  RA Volume:   15.30 ml 6.09 ml/m LA Vol (A4C):   66.4 ml 26.42 ml/m LA Biplane Vol: 72.9 ml 29.00 ml/m  AORTIC VALVE AV Area (Vmax):    2.69 cm AV Area (Vmean):   2.75 cm AV Area (VTI):     2.94 cm AV Vmax:           131.50 cm/s AV Vmean:           88.400 cm/s AV VTI:            0.283 m AV Peak Grad:      6.9 mmHg AV Mean Grad:      3.5 mmHg LVOT Vmax:         85.20 cm/s LVOT Vmean:        58.450 cm/s LVOT VTI:          0.200 m LVOT/AV VTI ratio: 0.71  AORTA Ao Root diam: 3.20 cm Ao Asc diam:  3.20 cm MITRAL VALVE MV Area (PHT): 4.68 cm    SHUNTS MV Decel Time: 162 msec    Systemic VTI:  0.20 m MV E velocity: 51.40 cm/s  Systemic Diam: 2.30 cm MV A velocity: 69.70 cm/s MV E/A ratio:  0.74 Maude Emmer MD Electronically signed by Maude Emmer MD Signature Date/Time: 09/27/2023/11:52:03 AM    Final     Cardiac Studies   Echo as above  Cath:  as above.   Patient Profile     63 y.o. male with a hx of cardiomyopathy who is being seen today for the evaluation of chest pain at the request of Dr. Melvenia.     Assessment & Plan    CARDIOMYOPATHY:    He has had a mildly reduced ejection fraction of about 45%. In July 2016 his EF was said to be about 55%. Perfusion study in 2017 demonstrated EF of 47% with a fixed inferior wall defect. Echo 2023 with EF 55 - 60%.    CAD:  Occluded RCA as above. Attempted PCI but failed.   Medical management.  Changed to Plavix  from ASA.  Imdur  increased.  OK to discharge later today after dialysis.     HTN:   BP is labile.  Increased Norvasc  this admission.  Also increased Imdur .  Follow [facility] as an out patient.   CKD:    Currently in dialysis.    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  Free water  restrict.  Follow as an out patient.      For questions or updates, please contact [facility] Please consult [email or web address] for contact info under Cardiology/STEMI.   Signed, Lynwood Schilling, MD  09/28/2023, 9:17 AM

## 2023-09-28 NOTE — Plan of Care (Signed)

## 2023-09-29 ENCOUNTER — Ambulatory Visit: Admitting: Cardiology

## 2023-09-29 ENCOUNTER — Telehealth: Payer: Self-pay | Admitting: Nephrology

## 2023-09-29 ENCOUNTER — Telehealth: Payer: Self-pay

## 2023-09-29 ENCOUNTER — Ambulatory Visit: Payer: Self-pay | Admitting: Cardiology

## 2023-09-29 DIAGNOSIS — N186 End stage renal disease: Secondary | ICD-10-CM | POA: Diagnosis not present

## 2023-09-29 DIAGNOSIS — Z992 Dependence on renal dialysis: Secondary | ICD-10-CM | POA: Diagnosis not present

## 2023-09-29 DIAGNOSIS — N2581 Secondary hyperparathyroidism of renal origin: Secondary | ICD-10-CM | POA: Diagnosis not present

## 2023-09-29 LAB — LIPOPROTEIN A (LPA): Lipoprotein (a): 253.4 nmol/L — ABNORMAL HIGH (ref <75.0)

## 2023-09-29 NOTE — Transitions of Care (Post Inpatient/ED Visit) (Signed)
   09/29/2023  Name: Craig Flynn MRN: [id number] DOB: 01/27/1961  Today's TOC FU Call Status: Today's TOC FU Call Status:: Unsuccessful Call (1st Attempt) Unsuccessful Call (1st Attempt) Date: 09/29/23  Attempted to reach the patient regarding the most recent Inpatient/ED visit. Patient answered, HIPAA verified, unable to talk right now, was driving out and about.  Follow Up Plan: Additional outreach attempts will be made to reach the patient to complete the Transitions of Care (Post Inpatient/ED visit) call.   Richerd Fish, RN, BSN, CCM [facility], [facility] RN Care Manager Direct Dial : [phone]

## 2023-09-29 NOTE — Telephone Encounter (Signed)
 Transition of Care - Initial Contact after Hospitalization  Date of discharge:  09/28/23 Date of contact: 09/29/23  Method: Phone Spoke to: Patient  Patient contacted to discuss transition of care from recent inpatient hospitalization. Patient was admitted to [facility] from 7/5 -7/825 with discharge diagnosis of CAD.   The discharge medication list was reviewed. Patient understands the changes.   Patient will return to his/her outpatient HD unit on: He went to outpatient dialysis today. Next dialysis on Friday.   No other concerns at this time.

## 2023-09-30 ENCOUNTER — Telehealth: Payer: Self-pay | Admitting: *Deleted

## 2023-09-30 NOTE — Transitions of Care (Post Inpatient/ED Visit) (Signed)
 09/30/2023  Name: Craig Flynn MRN: [id number] DOB: 01/11/1961  Today's TOC FU Call Status: Today's TOC FU Call Status:: Successful TOC FU Call Completed TOC FU Call Complete Date: 09/30/23 Patient's Name and Date of Birth confirmed.  Transition Care Management Follow-up Telephone Call Date of Discharge: 09/28/23 Discharge Facility: [facility]) Type of Discharge: Inpatient Admission Primary Inpatient Discharge Diagnosis:: Chest pain: MI ruled out How have you been since you were released from the hospital?: Better (I am doing fine, back to my normal self; I am independent and manage everything on my own; Can't review medications right now, I don't have the papers with me and don't have time.  I'll go everything with my doctors next week) Any questions or concerns?: No  Items Reviewed: Did you receive and understand the discharge instructions provided?: Yes (Reviewed with patient who verbalizes good understanding of same - he is unable to review medications; states he promises he will do later today- this was highly encouraged) Medications obtained,verified, and reconciled?: Partial Review Completed (Per patient report: obtained/ is taking all newly Rx'd medications as instructed; self-manages medications and denies questions/ concerns around medications today) Reason for Partial Mediation Review: Patient not near medication list during TOC call: he is able to verbalize some of the changes/ new medications from his memory- but not all of them; he promises he will review the discharge medication updates in detail this evening when he is near his medications; he declines further outreaches due to having hemodialysis three times a week and I have managed my own medications for my whole life; I just can't remember everything off the top of my head right now; He states he is completely capable in self- managing his care at home and his medications and don't need anyone to call me to  check in on a regular basis Any new allergies since your discharge?: No Dietary orders reviewed?: Yes Type of Diet Ordered:: No special diet, but I eat healthy Do you have support at home?: Yes People in Home [RPT]: alone Name of Support/Comfort Primary Source: Reports independent in self-care activities; reports resides alone; has local supportive son who assists as/ if needed/ indicated  Medications Reviewed Today: Medications Reviewed Today     Reviewed by Alinda Egolf M, RN (Registered Nurse) on 09/30/23 at 1457  Med List Status: <None>   Medication Order Taking? Sig Documenting Provider Last Dose Status Informant  acetaminophen  (TYLENOL ) 500 MG tablet [id number]  Take 1,000 mg by mouth daily as needed for moderate pain (pain score 4-6) or mild pain (pain score 1-3). [provider]  Active Self, Pharmacy Records  albuterol  (VENTOLIN  HFA) [phone] Base) MCG/ACT inhaler [id number]  Inhale 2 puffs into the lungs every 6 (six) hours as needed for wheezing or shortness of breath. Alvia Corean CROME, FNP  Active Self, Pharmacy Records  amLODipine  (NORVASC ) 5 MG tablet [id number] Yes Take 1 tablet (5 mg total) by mouth daily. Amin, Ankit C, MD  Active   b complex-vitamin c-folic acid  (NEPHRO-VITE) 0.8 MG TABS tablet [id number]  Take 1 tablet by mouth daily. [provider]  Active Self, Pharmacy Records  calcitRIOL  (ROCALTROL ) 0.5 MCG capsule [id number]  Take 8 capsules (4 mcg total) by mouth every Monday, Wednesday, and Friday with hemodialysis. Caleen Burgess BROCKS, MD  Active   cinacalcet  (SENSIPAR ) 30 MG tablet [id number]  Take 3 tablets (90 mg total) by mouth daily with supper. Amin, Ankit C, MD  Active   clopidogrel  (PLAVIX ) 75 MG tablet  [id number] Yes Take 1 tablet (75 mg total) by mouth daily. Caleen Burgess BROCKS, MD  Active   cyanocobalamin  (VITAMIN B12) 1000 MCG tablet [id number]  Take 1,000 mcg by mouth daily. [provider]  Active Self, Pharmacy Records  furosemide   (LASIX ) 80 MG tablet [id number]  Take 1 tablet (80 mg total) by mouth daily. Caleen Burgess BROCKS, MD  Active   gabapentin  (NEURONTIN ) 100 MG capsule [id number]  Take 100 mg by mouth at bedtime. [provider]  Active Self, Pharmacy Records  isosorbide  mononitrate (IMDUR ) 60 MG 24 hr tablet [id number] Yes Take 1 tablet (60 mg total) by mouth daily. Amin, Ankit C, MD  Active   latanoprost (XALATAN) 0.005 % ophthalmic solution [id number]  Place 1 drop into the right eye at bedtime. [provider]  Active Self, Pharmacy Records  levothyroxine  (SYNTHROID ) 125 MCG tablet [id number]  TAKE 1 TABLET(125 MCG) BY MOUTH EVERY MORNING Norleen Lynwood ORN, MD  Active Self, Pharmacy Records  nitroGLYCERIN  (NITROSTAT ) 0.4 MG SL tablet [id number]  DISSOLVE 1 TABLET UNDER THE TONGUE EVERY 5 MINUTES AS NEEDED FOR CHEST PAIN  Patient taking differently: Place 0.4 mg under the tongue every 5 (five) minutes as needed for chest pain.   Jesus Elspeth Sieving, MD  Active Self, Pharmacy Records           Med Note [facility], [facility] I   Sun May 17, 2022  3:38 AM)    pantoprazole  (PROTONIX ) 40 MG tablet [id number]  Take 1 tablet (40 mg total) by mouth daily.  Patient taking differently: Take 80 mg by mouth daily.   Norleen Lynwood ORN, MD  Active Self, Pharmacy Records  sevelamer  carbonate (RENVELA ) 800 MG tablet [id number]  Take 800 mg by mouth in the morning and at bedtime. [provider]  Active Self, Pharmacy Records           Home Care and Equipment/Supplies: Were Home Health Services Ordered?: No Any new equipment or medical supplies ordered?: No  Functional Questionnaire: Do you need assistance with bathing/showering or dressing?: No Do you need assistance with meal preparation?: No Do you need assistance with eating?: No Do you have difficulty maintaining continence: No Do you need assistance with getting out of bed/getting out of a chair/moving?: No Do you have difficulty managing or taking your  medications?: No  Follow up appointments reviewed: PCP Follow-up appointment confirmed?: Yes (care coordination outreach in real-time with scheduling care guide to successfully schedule hospital follow up PCP appointment 10/05/23) Date of PCP follow-up appointment?: 10/05/23 Follow-up Provider: PCP Specialist Hospital Follow-up appointment confirmed?: Yes Date of Specialist follow-up appointment?: 10/07/23 (unable to confirm this appointment per review of EHR: patient states they called me from Dr. Denver office just awhile ago and set it all up) Follow-Up Specialty Provider:: cardiology provider Do you need transportation to your follow-up appointment?: No Do you understand care options if your condition(s) worsen?: Yes-patient verbalized understanding  SDOH Interventions Today    Flowsheet Row Most Recent Value  SDOH Interventions   Food Insecurity Interventions Intervention Not Indicated  Housing Interventions Intervention Not Indicated  Transportation Interventions Intervention Not Indicated  Utilities Interventions Intervention Not Indicated   Patient declines need for ongoing/ further care management/ coordination outreach; declines enrollment in 30-day TOC program- declines taking my direct phone number should needs/ concerns arise post-TOC call   See TOC assessment tabs for additional assessment/ TOC intervention information  Pls call/ message for questions,  Briselda Naval Mckinney Shavonn Convey, RN, BSN, Media planner  Transitions of Care  [facility]  [place] [phone]: direct office

## 2023-10-01 DIAGNOSIS — N186 End stage renal disease: Secondary | ICD-10-CM | POA: Diagnosis not present

## 2023-10-01 DIAGNOSIS — N2581 Secondary hyperparathyroidism of renal origin: Secondary | ICD-10-CM | POA: Diagnosis not present

## 2023-10-01 DIAGNOSIS — Z992 Dependence on renal dialysis: Secondary | ICD-10-CM | POA: Diagnosis not present

## 2023-10-04 DIAGNOSIS — N2581 Secondary hyperparathyroidism of renal origin: Secondary | ICD-10-CM | POA: Diagnosis not present

## 2023-10-04 DIAGNOSIS — Z992 Dependence on renal dialysis: Secondary | ICD-10-CM | POA: Diagnosis not present

## 2023-10-04 DIAGNOSIS — N186 End stage renal disease: Secondary | ICD-10-CM | POA: Diagnosis not present

## 2023-10-05 ENCOUNTER — Inpatient Hospital Stay: Admitting: Internal Medicine

## 2023-10-06 DIAGNOSIS — Z992 Dependence on renal dialysis: Secondary | ICD-10-CM | POA: Diagnosis not present

## 2023-10-06 DIAGNOSIS — R079 Chest pain, unspecified: Secondary | ICD-10-CM | POA: Diagnosis not present

## 2023-10-06 DIAGNOSIS — N186 End stage renal disease: Secondary | ICD-10-CM | POA: Diagnosis not present

## 2023-10-06 DIAGNOSIS — N2581 Secondary hyperparathyroidism of renal origin: Secondary | ICD-10-CM | POA: Diagnosis not present

## 2023-10-08 ENCOUNTER — Encounter: Payer: Self-pay | Admitting: Advanced Practice Midwife

## 2023-10-08 DIAGNOSIS — N2581 Secondary hyperparathyroidism of renal origin: Secondary | ICD-10-CM | POA: Diagnosis not present

## 2023-10-08 DIAGNOSIS — Z992 Dependence on renal dialysis: Secondary | ICD-10-CM | POA: Diagnosis not present

## 2023-10-08 DIAGNOSIS — N186 End stage renal disease: Secondary | ICD-10-CM | POA: Diagnosis not present

## 2023-10-11 DIAGNOSIS — N2581 Secondary hyperparathyroidism of renal origin: Secondary | ICD-10-CM | POA: Diagnosis not present

## 2023-10-11 DIAGNOSIS — Z992 Dependence on renal dialysis: Secondary | ICD-10-CM | POA: Diagnosis not present

## 2023-10-11 DIAGNOSIS — N186 End stage renal disease: Secondary | ICD-10-CM | POA: Diagnosis not present

## 2023-10-13 DIAGNOSIS — N2581 Secondary hyperparathyroidism of renal origin: Secondary | ICD-10-CM | POA: Diagnosis not present

## 2023-10-13 DIAGNOSIS — Z992 Dependence on renal dialysis: Secondary | ICD-10-CM | POA: Diagnosis not present

## 2023-10-13 DIAGNOSIS — N186 End stage renal disease: Secondary | ICD-10-CM | POA: Diagnosis not present

## 2023-10-15 DIAGNOSIS — Z992 Dependence on renal dialysis: Secondary | ICD-10-CM | POA: Diagnosis not present

## 2023-10-15 DIAGNOSIS — N2581 Secondary hyperparathyroidism of renal origin: Secondary | ICD-10-CM | POA: Diagnosis not present

## 2023-10-15 DIAGNOSIS — N186 End stage renal disease: Secondary | ICD-10-CM | POA: Diagnosis not present

## 2023-10-18 DIAGNOSIS — N2581 Secondary hyperparathyroidism of renal origin: Secondary | ICD-10-CM | POA: Diagnosis not present

## 2023-10-18 DIAGNOSIS — N186 End stage renal disease: Secondary | ICD-10-CM | POA: Diagnosis not present

## 2023-10-18 DIAGNOSIS — Z992 Dependence on renal dialysis: Secondary | ICD-10-CM | POA: Diagnosis not present

## 2023-10-20 DIAGNOSIS — N2581 Secondary hyperparathyroidism of renal origin: Secondary | ICD-10-CM | POA: Diagnosis not present

## 2023-10-20 DIAGNOSIS — N186 End stage renal disease: Secondary | ICD-10-CM | POA: Diagnosis not present

## 2023-10-20 DIAGNOSIS — Z992 Dependence on renal dialysis: Secondary | ICD-10-CM | POA: Diagnosis not present

## 2023-10-21 DIAGNOSIS — N186 End stage renal disease: Secondary | ICD-10-CM | POA: Diagnosis not present

## 2023-10-21 DIAGNOSIS — Z992 Dependence on renal dialysis: Secondary | ICD-10-CM | POA: Diagnosis not present

## 2023-10-21 DIAGNOSIS — I129 Hypertensive chronic kidney disease with stage 1 through stage 4 chronic kidney disease, or unspecified chronic kidney disease: Secondary | ICD-10-CM | POA: Diagnosis not present

## 2023-10-22 DIAGNOSIS — N186 End stage renal disease: Secondary | ICD-10-CM | POA: Diagnosis not present

## 2023-10-22 DIAGNOSIS — N2581 Secondary hyperparathyroidism of renal origin: Secondary | ICD-10-CM | POA: Diagnosis not present

## 2023-10-22 DIAGNOSIS — Z992 Dependence on renal dialysis: Secondary | ICD-10-CM | POA: Diagnosis not present

## 2023-10-25 DIAGNOSIS — N2581 Secondary hyperparathyroidism of renal origin: Secondary | ICD-10-CM | POA: Diagnosis not present

## 2023-10-25 DIAGNOSIS — Z992 Dependence on renal dialysis: Secondary | ICD-10-CM | POA: Diagnosis not present

## 2023-10-25 DIAGNOSIS — N186 End stage renal disease: Secondary | ICD-10-CM | POA: Diagnosis not present

## 2023-10-27 DIAGNOSIS — N186 End stage renal disease: Secondary | ICD-10-CM | POA: Diagnosis not present

## 2023-10-27 DIAGNOSIS — N2581 Secondary hyperparathyroidism of renal origin: Secondary | ICD-10-CM | POA: Diagnosis not present

## 2023-10-27 DIAGNOSIS — Z992 Dependence on renal dialysis: Secondary | ICD-10-CM | POA: Diagnosis not present

## 2023-10-29 DIAGNOSIS — N2581 Secondary hyperparathyroidism of renal origin: Secondary | ICD-10-CM | POA: Diagnosis not present

## 2023-10-29 DIAGNOSIS — Z992 Dependence on renal dialysis: Secondary | ICD-10-CM | POA: Diagnosis not present

## 2023-10-29 DIAGNOSIS — N186 End stage renal disease: Secondary | ICD-10-CM | POA: Diagnosis not present

## 2023-11-01 DIAGNOSIS — Z992 Dependence on renal dialysis: Secondary | ICD-10-CM | POA: Diagnosis not present

## 2023-11-01 DIAGNOSIS — N186 End stage renal disease: Secondary | ICD-10-CM | POA: Diagnosis not present

## 2023-11-01 DIAGNOSIS — N2581 Secondary hyperparathyroidism of renal origin: Secondary | ICD-10-CM | POA: Diagnosis not present

## 2023-11-03 DIAGNOSIS — Z992 Dependence on renal dialysis: Secondary | ICD-10-CM | POA: Diagnosis not present

## 2023-11-03 DIAGNOSIS — N186 End stage renal disease: Secondary | ICD-10-CM | POA: Diagnosis not present

## 2023-11-03 DIAGNOSIS — N2581 Secondary hyperparathyroidism of renal origin: Secondary | ICD-10-CM | POA: Diagnosis not present

## 2023-11-05 DIAGNOSIS — N2581 Secondary hyperparathyroidism of renal origin: Secondary | ICD-10-CM | POA: Diagnosis not present

## 2023-11-05 DIAGNOSIS — N186 End stage renal disease: Secondary | ICD-10-CM | POA: Diagnosis not present

## 2023-11-05 DIAGNOSIS — Z992 Dependence on renal dialysis: Secondary | ICD-10-CM | POA: Diagnosis not present

## 2023-11-07 ENCOUNTER — Ambulatory Visit (HOSPITAL_COMMUNITY)
Admission: EM | Admit: 2023-11-07 | Discharge: 2023-11-07 | Disposition: A | Discharge status: Home / Self Care | Attending: Family Medicine | Admitting: Family Medicine

## 2023-11-07 ENCOUNTER — Encounter (HOSPITAL_COMMUNITY): Payer: Self-pay | Admitting: *Deleted

## 2023-11-07 ENCOUNTER — Ambulatory Visit (INDEPENDENT_AMBULATORY_CARE_PROVIDER_SITE_OTHER)

## 2023-11-07 ENCOUNTER — Other Ambulatory Visit: Payer: Self-pay

## 2023-11-07 DIAGNOSIS — S92514A Nondisplaced fracture of proximal phalanx of right lesser toe(s), initial encounter for closed fracture: Secondary | ICD-10-CM

## 2023-11-07 NOTE — ED Provider Notes (Signed)
 [facility]    CSN: [id number] Arrival date & time: 11/07/23  1113      History   Chief Complaint Chief Complaint  Patient presents with   Toe Pain    HPI Craig Flynn is a 63 y.o. male.   Patient presents to clinic over concern of right sided fourth toe pain.  He was getting a cramp in one of his legs so he went to walk it out when he stepped his fourth toe on the bed frame 3 nights ago.  Has had swelling and pain to the toe.  Has taken Tylenol  and ibuprofen  and used ice last night.  The history is provided by medical records and the patient.  Toe Pain    Past Medical History:  Diagnosis Date   Acute pancreatitis 08/22/2020   Acute systolic CHF (congestive heart failure) (HCC) 11/17/2013   Anemia 06/2015   Anemia due to acquired thiamine deficiency 03/25/2020   Arthritis    knee   Bacterial sinusitis 10/16/2019   Cancer (HCC)    thyroid  cancer   Cardiomyopathy (HCC)    a. 10/2013: EF reduced to 35-40% b. 09/2014: EF improved to 55-60%, Grade 1 DD noted.   Chest pain 06/2015   CHF (congestive heart failure) (HCC)    CKD (chronic kidney disease), stage IV [facility])    sees  Dr Betsey   Dyspnea    with exerion    GERD (gastroesophageal reflux disease)    Glaucoma    Gout    Hypertension    Obesity    Pancreatitis    Sleep apnea    not wearing c-pap now, needs new slep study per pt. (01/22/2017)   Tuberculosis 1981   while the pt was in the service.   Tubular adenoma of colon 10/2015    Patient Active Problem List   Diagnosis Date Noted   Chest pain, rule out acute myocardial infarction 09/25/2023   Nasal congestion 09/02/2023   Wheezing 09/02/2023   Fluid overload, unspecified 07/05/2023   Other disorders of phosphorus metabolism 04/01/2023   Paresthesia of foot, bilateral 05/06/2022   Cervical radiculitis 02/25/2022   Chronic toe pain, bilateral 02/25/2022   Vertigo 11/08/2021   Dizziness 09/20/2021   Tinnitus of right ear 09/20/2021    Neuropathy 09/20/2021   Postural lightheadedness 06/19/2021   Pulsatile tinnitus of right ear 05/25/2021   Retinal artery thrombosis, 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   Mild protein-calorie malnutrition (HCC) 10/07/2020   Allergy, unspecified, initial encounter 08/28/2020   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/28/2020   Complication of vascular dialysis catheter 08/28/2020   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   End stage renal disease (HCC) 08/28/2020   B12 deficiency 06/08/2020   Gynecomastia, male 06/07/2020   Erectile dysfunction 06/07/2020   Hand cramp 06/07/2020   Aortic atherosclerosis (HCC) 06/07/2020   Umbilical hernia without obstruction and without gangrene 03/18/2020   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   Deficiency anemia 03/18/2020   Hyperlipidemia LDL goal <130 03/18/2020   Upper back pain on left side 02/06/2020   History of TB (tuberculosis) 10/30/2019   Allergic rhinitis 10/16/2019   CAD (coronary artery disease) 09/07/2019   Nephrosclerosis 09/07/2019   Obesity (BMI 30-39.9) 09/07/2019   Secondary hyperparathyroidism (HCC) 09/07/2019   Obesity, diabetes, 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/22/2019   Ulnar neuritis, right 04/25/2019   Muscle cramps 02/26/2019   Abnormal TSH 02/26/2019   Vitamin D  deficiency 02/22/2019   Iron  deficiency 02/22/2019   Laryngopharyngeal reflux (LPR) 10/04/2018  Whiplash injuries, initial encounter 09/21/2018   Degenerative arthritis of right knee 09/21/2018   Chronic diastolic HF (heart failure) (HCC) 07/04/2018   Intractable post-traumatic headache 06/16/2018   Low back pain 06/15/2018   Neck pain 06/15/2018   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   Thyroid  nodule 01/22/2017   Ptosis 01/09/2016   GERD (gastroesophageal reflux disease) 07/29/2015   Osteoarthritis 05/03/2015   Tobacco abuse 02/26/2015   OSA (obstructive sleep apnea) 12/27/2014   Encounter for well adult exam with abnormal findings 11/12/2014   Anemia  in chronic kidney disease 11/12/2014   Left knee pain 09/22/2014   Morbid obesity (HCC) 11/17/2013   HTN (hypertension) 12/11/2011   SOB (shortness of breath) 12/11/2011   Gout 12/31/2006   HERNIATED LUMBAR DISK WITH RADICULOPATHY 12/30/2006    Past Surgical History:  Procedure Laterality Date   AV FISTULA PLACEMENT Left 03/25/2016   Procedure: Creation of Left Arm ARTERIOVENOUS (AV) FISTULA;  Surgeon: Carlin FORBES Haddock, MD;  Location: [facility] OR;  Service: Vascular;  Laterality: Left;   AV FISTULA PLACEMENT Left 08/27/2020   Procedure: LEFT BASILIC VEIN ARTERIOVENOUS (AV) FISTULA CREATION;  Surgeon: Oris Krystal FALCON, MD;  Location: [facility] OR;  Service: Vascular;  Laterality: Left;   BASCILIC VEIN TRANSPOSITION Left 10/22/2020   Procedure: LEFT SECOND STAGE BASILIC VEIN TRANSPOSITION;  Surgeon: Haddock Carlin FORBES, MD;  Location: [facility] OR;  Service: Vascular;  Laterality: Left;   COLONOSCOPY W/ BIOPSIES AND POLYPECTOMY     no problem   CORONARY BALLOON ANGIOPLASTY N/A 09/27/2023   Procedure: CORONARY BALLOON ANGIOPLASTY;  Surgeon: Ladona Heinz, MD;  [facility]: [facility];  Service: Cardiovascular;  Laterality: N/A;   HERNIA REPAIR     IR FLUORO GUIDE CV LINE RIGHT  08/22/2020   IR US  GUIDE VASC ACCESS RIGHT  08/22/2020   LAPAROSCOPIC CHOLECYSTECTOMY     LEFT HEART CATH AND CORONARY ANGIOGRAPHY N/A 09/27/2023   Procedure: LEFT HEART CATH AND CORONARY ANGIOGRAPHY;  Surgeon: Ladona Heinz, MD;  [facility];  Service: Cardiovascular;  Laterality: N/A;   LIGATION OF COMPETING BRANCHES OF ARTERIOVENOUS FISTULA Left 01/25/2018   Procedure: LIGATION OF COMPETING BRANCHES And Revision of ARTERIOVENOUS FISTULA LEFT ARM.;  Surgeon: Haddock Carlin FORBES, MD;  Location: [facility] OR;  Service: Vascular;  Laterality: Left;   THYROIDECTOMY  01/22/2017   THYROIDECTOMY Left 01/22/2017   Procedure: THYROIDECTOMY;  Surgeon: Carlie Clark, MD;  Location: [facility] OR;  Service: ENT;  Laterality: Left;   THYROIDECTOMY Right 03/26/2017    Procedure: COMPLETION OF THYROIDECTOMY;  Surgeon: Carlie Clark, MD;  Location: [facility] OR;  Service: ENT;  Laterality: Right;   UMBILICAL HERNIA REPAIR     WISDOM TOOTH EXTRACTION         Home Medications    Prior to Admission medications   Medication Sig Start Date End Date Taking? Authorizing Provider  acetaminophen  (TYLENOL ) 500 MG tablet Take 1,000 mg by mouth daily as needed for moderate pain (pain score 4-6) or mild pain (pain score 1-3).   Yes [provider]  albuterol  (VENTOLIN  HFA) 108 (90 Base) MCG/ACT inhaler Inhale 2 puffs into the lungs every 6 (six) hours as needed for wheezing or shortness of breath. 09/02/23  Yes Alvia Corean CROME, FNP  amLODipine  (NORVASC ) 5 MG tablet Take 1 tablet (5 mg total) by mouth daily. 09/28/23  Yes Amin, Ankit C, MD  b complex-vitamin c-folic acid  (NEPHRO-VITE) 0.8 MG TABS tablet Take 1 tablet by mouth daily.   Yes [provider]  calcitRIOL  (ROCALTROL ) 0.5 MCG capsule Take 8 capsules (4 mcg total) by mouth every Monday, Wednesday, and Friday with hemodialysis. 09/29/23  Yes Amin, Ankit C, MD  cinacalcet  (SENSIPAR ) 30 MG tablet Take 3 tablets (90 mg total) by mouth daily with supper. 09/28/23  Yes Amin, Ankit C, MD  clopidogrel  (PLAVIX ) 75 MG tablet Take 1 tablet (75 mg total) by mouth daily. 09/28/23 09/27/24 Yes Amin, Ankit C, MD  cyanocobalamin  (VITAMIN B12) 1000 MCG tablet Take 1,000 mcg by mouth daily.   Yes [provider]  furosemide  (LASIX ) 80 MG tablet Take 1 tablet (80 mg total) by mouth daily. 09/28/23  Yes Amin, Ankit C, MD  gabapentin  (NEURONTIN ) 100 MG capsule Take 100 mg by mouth at bedtime. 09/17/23  Yes [provider]  isosorbide  mononitrate (IMDUR ) 60 MG 24 hr tablet Take 1 tablet (60 mg total) by mouth daily. 09/28/23  Yes Amin, Ankit C, MD  latanoprost (XALATAN) 0.005 % ophthalmic solution Place 1 drop into the right eye at bedtime.   Yes [provider]  levothyroxine  (SYNTHROID ) 125 MCG tablet  TAKE 1 TABLET(125 MCG) BY MOUTH EVERY MORNING 05/06/23  Yes Norleen Lynwood ORN, MD  nitroGLYCERIN  (NITROSTAT ) 0.4 MG SL tablet DISSOLVE 1 TABLET UNDER THE TONGUE EVERY 5 MINUTES AS NEEDED FOR CHEST PAIN Patient taking differently: Place 0.4 mg under the tongue every 5 (five) minutes as needed for chest pain. 03/29/19  Yes Hochman, Elspeth Sieving, MD  pantoprazole  (PROTONIX ) 40 MG tablet Take 1 tablet (40 mg total) by mouth daily. Patient taking differently: Take 80 mg by mouth daily. 10/21/21  Yes Norleen Lynwood ORN, MD  sevelamer  carbonate (RENVELA ) 800 MG tablet Take 800 mg by mouth in the morning and at bedtime.   Yes [provider]    Family History Family History  Problem Relation Age of Onset   Diabetes Mother    Stroke Mother    Pneumonia Father        died of Pneumonia x3   Kidney disease Maternal Grandmother    Hypertension Maternal Grandmother    Healthy Maternal Grandfather    Healthy Paternal Grandmother    Healthy Paternal Grandfather    CAD Neg Hx    Colon cancer Neg Hx    Esophageal cancer Neg Hx    Pancreatic cancer Neg Hx    Prostate cancer Neg Hx    Stomach cancer Neg Hx    Rectal cancer Neg Hx     Social History Social History   Tobacco Use   Smoking status: Former    Current packs/day: 0.00    Types: Cigarettes    Start date: 03/16/1982    Quit date: 03/16/2022    Years since quitting: 1.6   Smokeless tobacco: Never   Tobacco comments:    1/2 pack per day (03/25/2021)    Patient stated he quit smoking on 03/16/2022  Vaping Use   Vaping status: Never Used  Substance Use Topics   Alcohol use: Not Currently    Alcohol/week: 0.0 standard drinks of alcohol   Drug use: No     Allergies   Patient has no known allergies.   Review of Systems Review of Systems  Per HPI  Physical Exam Triage Vital Signs ED Triage Vitals  Encounter Vitals Group     BP 11/07/23 1221 (!) 187/74     Girls Systolic BP Percentile --      Girls Diastolic BP Percentile --       Boys Systolic BP Percentile --  Boys Diastolic BP Percentile --      Pulse Rate 11/07/23 1218 70     Resp 11/07/23 1218 20     Temp 11/07/23 1218 98 F (36.7 C)     Temp src --      SpO2 11/07/23 1218 100 %     Weight --      Height --      Head Circumference --      Peak Flow --      Pain Score 11/07/23 1221 7     Pain Loc --      Pain Education --      Exclude from Growth Chart --    No data found.  Updated Vital Signs BP (!) 187/74   Pulse 70   Temp 98 F (36.7 C)   Resp 20   SpO2 100%   Visual Acuity Right Eye Distance:   Left Eye Distance:   Bilateral Distance:    Right Eye Near:   Left Eye Near:    Bilateral Near:     Physical Exam Vitals and nursing note reviewed.  Constitutional:      Appearance: Normal appearance.  HENT:     Head: Normocephalic and atraumatic.     Right Ear: External ear normal.     Left Ear: External ear normal.     Nose: Nose normal.     Mouth/Throat:     Mouth: Mucous membranes are moist.  Eyes:     Conjunctiva/sclera: Conjunctivae normal.  Cardiovascular:     Rate and Rhythm: Normal rate.     Pulses: Normal pulses.  Pulmonary:     Effort: Pulmonary effort is normal. No respiratory distress.  Musculoskeletal:        General: Swelling, tenderness and signs of injury present. No deformity. Normal range of motion.       Feet:  Skin:    General: Skin is warm and dry.     Capillary Refill: Capillary refill takes less than 2 seconds.  Neurological:     General: No focal deficit present.     Mental Status: He is alert and oriented to person, place, and time.  Psychiatric:        Mood and Affect: Mood normal.        Behavior: Behavior normal. Behavior is cooperative.      UC Treatments / Results  Labs (all labs ordered are listed, but only abnormal results are displayed) Labs Reviewed - No data to display  EKG   Radiology DG Toe 4th Right Result Date: 11/07/2023 CLINICAL DATA:  Fourth toe pain for 3 days,  hit fourth digit on bed EXAM: RIGHT FOURTH TOE COMPARISON:  None Available. FINDINGS: Frontal, oblique, and lateral views of the right fourth digit are obtained. On the oblique projection, there is a nondisplaced intra-articular fracture through the lateral aspect of the base of the fourth proximal phalanx. No other acute bony abnormalities. Soft tissues are unremarkable. IMPRESSION: 1. Nondisplaced intra-articular fracture lateral aspect base of the fourth proximal phalanx. Electronically Signed   By: Ozell Daring M.D.   On: 11/07/2023 13:21    Procedures Procedures (including critical care time)  Medications Ordered in UC Medications - No data to display  Initial Impression / Assessment and Plan / UC Course  I have reviewed the triage vital signs and the nursing notes.  Pertinent labs & imaging results that were available during my care of the patient were reviewed by me and considered in my medical decision  making (see chart for details).  Vitals and triage reviewed, patient is hemodynamically stable. 4th right toe is swollen and tender, brisk capillary refill distally. Imaging by my interpretation shows proximal phalanx fracture at the base, post-op shoe and pain management discussed.   POC, f/u care and return precautions given, no questions at this time.      Final Clinical Impressions(s) / UC Diagnoses   Final diagnoses:  Closed nondisplaced fracture of proximal phalanx of lesser toe of right foot, initial encounter     Discharge Instructions      You x-ray showed a nondisplaced intra-articular fracture lateral aspect base of the fourth proximal phalanx. Please wear the post-op shoe to help protect your foot from further injury. Continue taking Tylenol  as needed for pain, can continue icing.   Follow-up with your orthopedic in the next week or two to assess for healing.     ED Prescriptions   None    PDMP not reviewed this encounter.   Dreama, Morelia Cassells  N,  FNP 11/07/23 1344

## 2023-11-07 NOTE — ED Notes (Addendum)
 PT reports RT 4th toe pain for 3 days. Pt hit the 4th toe on end of bed. PT took tylenol  and ibuprofen . Toe is swollen and painful.

## 2023-11-07 NOTE — Discharge Instructions (Addendum)
 You x-ray showed a nondisplaced intra-articular fracture lateral aspect base of the fourth proximal phalanx. Please wear the post-op shoe to help protect your foot from further injury. Continue taking Tylenol  as needed for pain, can continue icing.   Follow-up with your orthopedic in the next week or two to assess for healing.

## 2023-11-08 DIAGNOSIS — N186 End stage renal disease: Secondary | ICD-10-CM | POA: Diagnosis not present

## 2023-11-08 DIAGNOSIS — Z992 Dependence on renal dialysis: Secondary | ICD-10-CM | POA: Diagnosis not present

## 2023-11-08 DIAGNOSIS — N2581 Secondary hyperparathyroidism of renal origin: Secondary | ICD-10-CM | POA: Diagnosis not present

## 2023-11-10 DIAGNOSIS — N2581 Secondary hyperparathyroidism of renal origin: Secondary | ICD-10-CM | POA: Diagnosis not present

## 2023-11-10 DIAGNOSIS — Z992 Dependence on renal dialysis: Secondary | ICD-10-CM | POA: Diagnosis not present

## 2023-11-10 DIAGNOSIS — N186 End stage renal disease: Secondary | ICD-10-CM | POA: Diagnosis not present

## 2023-11-12 DIAGNOSIS — N2581 Secondary hyperparathyroidism of renal origin: Secondary | ICD-10-CM | POA: Diagnosis not present

## 2023-11-12 DIAGNOSIS — Z992 Dependence on renal dialysis: Secondary | ICD-10-CM | POA: Diagnosis not present

## 2023-11-12 DIAGNOSIS — N186 End stage renal disease: Secondary | ICD-10-CM | POA: Diagnosis not present

## 2023-11-15 DIAGNOSIS — N186 End stage renal disease: Secondary | ICD-10-CM | POA: Diagnosis not present

## 2023-11-15 DIAGNOSIS — Z992 Dependence on renal dialysis: Secondary | ICD-10-CM | POA: Diagnosis not present

## 2023-11-15 DIAGNOSIS — N2581 Secondary hyperparathyroidism of renal origin: Secondary | ICD-10-CM | POA: Diagnosis not present

## 2023-11-17 DIAGNOSIS — Z992 Dependence on renal dialysis: Secondary | ICD-10-CM | POA: Diagnosis not present

## 2023-11-17 DIAGNOSIS — N186 End stage renal disease: Secondary | ICD-10-CM | POA: Diagnosis not present

## 2023-11-17 DIAGNOSIS — N2581 Secondary hyperparathyroidism of renal origin: Secondary | ICD-10-CM | POA: Diagnosis not present

## 2023-11-19 DIAGNOSIS — N2581 Secondary hyperparathyroidism of renal origin: Secondary | ICD-10-CM | POA: Diagnosis not present

## 2023-11-19 DIAGNOSIS — N186 End stage renal disease: Secondary | ICD-10-CM | POA: Diagnosis not present

## 2023-11-19 DIAGNOSIS — Z992 Dependence on renal dialysis: Secondary | ICD-10-CM | POA: Diagnosis not present

## 2023-11-21 DIAGNOSIS — Z992 Dependence on renal dialysis: Secondary | ICD-10-CM | POA: Diagnosis not present

## 2023-11-21 DIAGNOSIS — I129 Hypertensive chronic kidney disease with stage 1 through stage 4 chronic kidney disease, or unspecified chronic kidney disease: Secondary | ICD-10-CM | POA: Diagnosis not present

## 2023-11-21 DIAGNOSIS — N186 End stage renal disease: Secondary | ICD-10-CM | POA: Diagnosis not present

## 2023-11-22 DIAGNOSIS — Z992 Dependence on renal dialysis: Secondary | ICD-10-CM | POA: Diagnosis not present

## 2023-11-22 DIAGNOSIS — N186 End stage renal disease: Secondary | ICD-10-CM | POA: Diagnosis not present

## 2023-11-22 DIAGNOSIS — N2581 Secondary hyperparathyroidism of renal origin: Secondary | ICD-10-CM | POA: Diagnosis not present

## 2023-11-24 DIAGNOSIS — Z992 Dependence on renal dialysis: Secondary | ICD-10-CM | POA: Diagnosis not present

## 2023-11-24 DIAGNOSIS — N2581 Secondary hyperparathyroidism of renal origin: Secondary | ICD-10-CM | POA: Diagnosis not present

## 2023-11-24 DIAGNOSIS — N186 End stage renal disease: Secondary | ICD-10-CM | POA: Diagnosis not present

## 2023-11-26 DIAGNOSIS — Z992 Dependence on renal dialysis: Secondary | ICD-10-CM | POA: Diagnosis not present

## 2023-11-26 DIAGNOSIS — N2581 Secondary hyperparathyroidism of renal origin: Secondary | ICD-10-CM | POA: Diagnosis not present

## 2023-11-26 DIAGNOSIS — N186 End stage renal disease: Secondary | ICD-10-CM | POA: Diagnosis not present

## 2023-11-29 DIAGNOSIS — N186 End stage renal disease: Secondary | ICD-10-CM | POA: Diagnosis not present

## 2023-11-29 DIAGNOSIS — Z992 Dependence on renal dialysis: Secondary | ICD-10-CM | POA: Diagnosis not present

## 2023-11-29 DIAGNOSIS — N2581 Secondary hyperparathyroidism of renal origin: Secondary | ICD-10-CM | POA: Diagnosis not present

## 2023-12-01 DIAGNOSIS — Z992 Dependence on renal dialysis: Secondary | ICD-10-CM | POA: Diagnosis not present

## 2023-12-01 DIAGNOSIS — N2581 Secondary hyperparathyroidism of renal origin: Secondary | ICD-10-CM | POA: Diagnosis not present

## 2023-12-01 DIAGNOSIS — N186 End stage renal disease: Secondary | ICD-10-CM | POA: Diagnosis not present

## 2023-12-03 DIAGNOSIS — Z992 Dependence on renal dialysis: Secondary | ICD-10-CM | POA: Diagnosis not present

## 2023-12-03 DIAGNOSIS — N2581 Secondary hyperparathyroidism of renal origin: Secondary | ICD-10-CM | POA: Diagnosis not present

## 2023-12-03 DIAGNOSIS — N186 End stage renal disease: Secondary | ICD-10-CM | POA: Diagnosis not present

## 2023-12-06 DIAGNOSIS — N2581 Secondary hyperparathyroidism of renal origin: Secondary | ICD-10-CM | POA: Diagnosis not present

## 2023-12-06 DIAGNOSIS — N186 End stage renal disease: Secondary | ICD-10-CM | POA: Diagnosis not present

## 2023-12-06 DIAGNOSIS — Z992 Dependence on renal dialysis: Secondary | ICD-10-CM | POA: Diagnosis not present

## 2023-12-08 DIAGNOSIS — N186 End stage renal disease: Secondary | ICD-10-CM | POA: Diagnosis not present

## 2023-12-08 DIAGNOSIS — N2581 Secondary hyperparathyroidism of renal origin: Secondary | ICD-10-CM | POA: Diagnosis not present

## 2023-12-08 DIAGNOSIS — Z992 Dependence on renal dialysis: Secondary | ICD-10-CM | POA: Diagnosis not present

## 2023-12-10 DIAGNOSIS — N186 End stage renal disease: Secondary | ICD-10-CM | POA: Diagnosis not present

## 2023-12-10 DIAGNOSIS — N2581 Secondary hyperparathyroidism of renal origin: Secondary | ICD-10-CM | POA: Diagnosis not present

## 2023-12-10 DIAGNOSIS — Z992 Dependence on renal dialysis: Secondary | ICD-10-CM | POA: Diagnosis not present

## 2023-12-13 DIAGNOSIS — Z992 Dependence on renal dialysis: Secondary | ICD-10-CM | POA: Diagnosis not present

## 2023-12-13 DIAGNOSIS — N2581 Secondary hyperparathyroidism of renal origin: Secondary | ICD-10-CM | POA: Diagnosis not present

## 2023-12-13 DIAGNOSIS — N186 End stage renal disease: Secondary | ICD-10-CM | POA: Diagnosis not present

## 2023-12-13 NOTE — Progress Notes (Deleted)
 Cardiology Office Note    Patient Name: Craig Flynn Date of Encounter: 12/13/2023  Primary Care Provider:  Norleen Agent ORN, MD Primary Cardiologist:  Agent Schilling, MD Primary Electrophysiologist: None   Past Medical History    Past Medical History:  Diagnosis Date   Acute pancreatitis 08/22/2020   Acute systolic CHF (congestive heart failure) (HCC) 11/17/2013   Anemia 06/2015   Anemia due to acquired thiamine deficiency 03/25/2020   Arthritis    knee   Bacterial sinusitis 10/16/2019   Cancer (HCC)    thyroid  cancer   Cardiomyopathy (HCC)    a. 10/2013: EF reduced to 35-40% b. 09/2014: EF improved to 55-60%, Grade 1 DD noted.   Chest pain 06/2015   CHF (congestive heart failure) (HCC)    CKD (chronic kidney disease), stage IV [facility])    sees  Dr Betsey   Dyspnea    with exerion    GERD (gastroesophageal reflux disease)    Glaucoma    Gout    Hypertension    Obesity    Pancreatitis    Sleep apnea    not wearing c-pap now, needs new slep study per pt. (01/22/2017)   Tuberculosis 1981   while the pt was in the service.   Tubular adenoma of colon 10/2015    History of Present Illness  Craig Flynn is a 63 y.o. male with a PMH of CAD s/p LHC 09/27/2023 with CTO of RCA with unsuccessful PCI and contour collateral blood flow with medical therapy recommended, NICM, HTN, ESRD (M,W,F), colon CA, retinal artery thrombosis, obesity, OSA,hypothyroidism s/p thyroidectomy, chronic diastolic HF who presents today for hospital follow-up.  Mr. Strohecker has been followed by Dr. Schilling for management of cardiomyopathy since 2016.  He was seen initially in 2015 with volume overload and 2D echo was completed showing mildly reduced EF of 45%.  Never followed up with Cardiology as an outpatient. He had a repeat echo in 09/2014 which showed an improved EF of 55-60%. He had a NM stress in 2017 that was likely normal on my review (inferior fixed defect likely diaphragm attenuation).  He completed an  ETT on 07/26/2019 that was negative with suboptimal heart rate response and low suspicion of CAD.  He was last seen in office on 09/01/2023 by Dr. Schilling and had complained of ongoing chest pain and underwent a PET stress test that revealed a high risk study with left heart cath completed for further evaluation.  Left heart cath on 09/27/2023 by Dr. Ladona that showed occluded RCA with failed PCI and collateral blood flow medical management recommended.  He was started on Plavix  and Imdur  was increased.   Patient denies chest pain, palpitations, dyspnea, PND, orthopnea, nausea, vomiting, dizziness, syncope, edema, weight gain, or early satiety.   Discussed the use of AI scribe software for clinical note transcription with the patient, who gave verbal consent to proceed.  History of Present Illness    ***Notes:   Review of Systems  Please see the history of present illness.    All other systems reviewed and are otherwise negative except as noted above.  Physical Exam    Wt Readings from Last 3 Encounters:  09/28/23 278 lb 3.5 oz (126.2 kg)  09/02/23 281 lb 3.2 oz (127.6 kg)  09/01/23 260 lb (117.9 kg)   CD:Uyzmz were no vitals filed for this visit.,There is no height or weight on file to calculate BMI. GEN: Well nourished, well developed in no acute distress Neck: No  JVD; No carotid bruits Pulmonary: Clear to auscultation without rales, wheezing or rhonchi  Cardiovascular: Normal rate. Regular rhythm. Normal S1. Normal S2.   Murmurs: There is no murmur.  ABDOMEN: Soft, non-tender, non-distended EXTREMITIES:  No edema; No deformity   EKG/LABS/ Recent Cardiac Studies   ECG personally reviewed by me today - ***  Risk Assessment/Calculations:   {Does this patient have ATRIAL FIBRILLATION?[phone]}      Lab Results  Component Value Date   WBC 5.2 09/28/2023   HGB 10.0 (L) 09/28/2023   HCT 30.4 (L) 09/28/2023   MCV 87.9 09/28/2023   PLT 147 (L) 09/28/2023   Lab Results   Component Value Date   CREATININE 10.89 (H) 09/28/2023   BUN 46 (H) 09/28/2023   NA 127 (L) 09/28/2023   K 4.5 09/28/2023   CL 89 (L) 09/28/2023   CO2 26 09/28/2023   Lab Results  Component Value Date   CHOL 105 09/26/2023   HDL 32 (L) 09/26/2023   LDLCALC 55 09/26/2023   TRIG 91 09/26/2023   CHOLHDL 3.3 09/26/2023    Lab Results  Component Value Date   HGBA1C 5.5 09/26/2023   Assessment & Plan    Assessment and Plan Assessment & Plan     1.  CAD  2.  Essential HTN  3.  HLD  4.  ESRD  5.  History NICM      Disposition: Follow-up with Lynwood Schilling, MD or APP in *** months {Are you ordering a CV Procedure (e.g. stress test, cath, DCCV, TEE, etc)?   Press F2        :[id number]}   Signed, Wyn Raddle, Jackee Shove, NP 12/13/2023, 10:36 AM [facility]

## 2023-12-14 ENCOUNTER — Ambulatory Visit: Attending: Nurse Practitioner | Admitting: Nurse Practitioner

## 2023-12-14 DIAGNOSIS — N186 End stage renal disease: Secondary | ICD-10-CM

## 2023-12-14 DIAGNOSIS — I2510 Atherosclerotic heart disease of native coronary artery without angina pectoris: Secondary | ICD-10-CM

## 2023-12-14 DIAGNOSIS — I428 Other cardiomyopathies: Secondary | ICD-10-CM

## 2023-12-14 DIAGNOSIS — E785 Hyperlipidemia, unspecified: Secondary | ICD-10-CM

## 2023-12-14 DIAGNOSIS — I10 Essential (primary) hypertension: Secondary | ICD-10-CM

## 2023-12-15 DIAGNOSIS — Z992 Dependence on renal dialysis: Secondary | ICD-10-CM | POA: Diagnosis not present

## 2023-12-15 DIAGNOSIS — N186 End stage renal disease: Secondary | ICD-10-CM | POA: Diagnosis not present

## 2023-12-15 DIAGNOSIS — N2581 Secondary hyperparathyroidism of renal origin: Secondary | ICD-10-CM | POA: Diagnosis not present

## 2023-12-17 DIAGNOSIS — Z992 Dependence on renal dialysis: Secondary | ICD-10-CM | POA: Diagnosis not present

## 2023-12-17 DIAGNOSIS — N2581 Secondary hyperparathyroidism of renal origin: Secondary | ICD-10-CM | POA: Diagnosis not present

## 2023-12-17 DIAGNOSIS — N186 End stage renal disease: Secondary | ICD-10-CM | POA: Diagnosis not present

## 2023-12-20 DIAGNOSIS — N186 End stage renal disease: Secondary | ICD-10-CM | POA: Diagnosis not present

## 2023-12-20 DIAGNOSIS — Z992 Dependence on renal dialysis: Secondary | ICD-10-CM | POA: Diagnosis not present

## 2023-12-20 DIAGNOSIS — N2581 Secondary hyperparathyroidism of renal origin: Secondary | ICD-10-CM | POA: Diagnosis not present

## 2023-12-21 DIAGNOSIS — I129 Hypertensive chronic kidney disease with stage 1 through stage 4 chronic kidney disease, or unspecified chronic kidney disease: Secondary | ICD-10-CM | POA: Diagnosis not present

## 2023-12-21 DIAGNOSIS — N186 End stage renal disease: Secondary | ICD-10-CM | POA: Diagnosis not present

## 2023-12-21 DIAGNOSIS — Z992 Dependence on renal dialysis: Secondary | ICD-10-CM | POA: Diagnosis not present

## 2023-12-22 DIAGNOSIS — Z992 Dependence on renal dialysis: Secondary | ICD-10-CM | POA: Diagnosis not present

## 2023-12-22 DIAGNOSIS — N2581 Secondary hyperparathyroidism of renal origin: Secondary | ICD-10-CM | POA: Diagnosis not present

## 2023-12-22 DIAGNOSIS — N186 End stage renal disease: Secondary | ICD-10-CM | POA: Diagnosis not present

## 2023-12-24 DIAGNOSIS — N186 End stage renal disease: Secondary | ICD-10-CM | POA: Diagnosis not present

## 2023-12-27 DIAGNOSIS — N186 End stage renal disease: Secondary | ICD-10-CM | POA: Diagnosis not present

## 2023-12-27 DIAGNOSIS — Z992 Dependence on renal dialysis: Secondary | ICD-10-CM | POA: Diagnosis not present

## 2023-12-28 DIAGNOSIS — R1310 Dysphagia, unspecified: Secondary | ICD-10-CM | POA: Diagnosis not present

## 2023-12-28 DIAGNOSIS — Z992 Dependence on renal dialysis: Secondary | ICD-10-CM | POA: Diagnosis not present

## 2023-12-28 DIAGNOSIS — Z79899 Other long term (current) drug therapy: Secondary | ICD-10-CM | POA: Diagnosis not present

## 2023-12-28 DIAGNOSIS — G4733 Obstructive sleep apnea (adult) (pediatric): Secondary | ICD-10-CM | POA: Diagnosis not present

## 2023-12-28 DIAGNOSIS — G4700 Insomnia, unspecified: Secondary | ICD-10-CM | POA: Diagnosis not present

## 2023-12-28 DIAGNOSIS — K219 Gastro-esophageal reflux disease without esophagitis: Secondary | ICD-10-CM | POA: Diagnosis not present

## 2023-12-28 DIAGNOSIS — R0683 Snoring: Secondary | ICD-10-CM | POA: Diagnosis not present

## 2023-12-29 DIAGNOSIS — N2581 Secondary hyperparathyroidism of renal origin: Secondary | ICD-10-CM | POA: Diagnosis not present

## 2023-12-29 DIAGNOSIS — Z992 Dependence on renal dialysis: Secondary | ICD-10-CM | POA: Diagnosis not present

## 2023-12-31 DIAGNOSIS — Z992 Dependence on renal dialysis: Secondary | ICD-10-CM | POA: Diagnosis not present

## 2023-12-31 DIAGNOSIS — N2581 Secondary hyperparathyroidism of renal origin: Secondary | ICD-10-CM | POA: Diagnosis not present

## 2023-12-31 DIAGNOSIS — N186 End stage renal disease: Secondary | ICD-10-CM | POA: Diagnosis not present

## 2024-01-03 DIAGNOSIS — Z992 Dependence on renal dialysis: Secondary | ICD-10-CM | POA: Diagnosis not present

## 2024-01-03 DIAGNOSIS — N186 End stage renal disease: Secondary | ICD-10-CM | POA: Diagnosis not present

## 2024-01-03 DIAGNOSIS — N2581 Secondary hyperparathyroidism of renal origin: Secondary | ICD-10-CM | POA: Diagnosis not present

## 2024-01-05 DIAGNOSIS — Z992 Dependence on renal dialysis: Secondary | ICD-10-CM | POA: Diagnosis not present

## 2024-01-05 DIAGNOSIS — N2581 Secondary hyperparathyroidism of renal origin: Secondary | ICD-10-CM | POA: Diagnosis not present

## 2024-01-05 DIAGNOSIS — N186 End stage renal disease: Secondary | ICD-10-CM | POA: Diagnosis not present

## 2024-01-12 DIAGNOSIS — N186 End stage renal disease: Secondary | ICD-10-CM | POA: Diagnosis not present

## 2024-01-19 DIAGNOSIS — N2581 Secondary hyperparathyroidism of renal origin: Secondary | ICD-10-CM | POA: Diagnosis not present

## 2024-01-19 DIAGNOSIS — Z992 Dependence on renal dialysis: Secondary | ICD-10-CM | POA: Diagnosis not present

## 2024-01-21 DIAGNOSIS — Z992 Dependence on renal dialysis: Secondary | ICD-10-CM | POA: Diagnosis not present

## 2024-01-21 DIAGNOSIS — N2581 Secondary hyperparathyroidism of renal origin: Secondary | ICD-10-CM | POA: Diagnosis not present

## 2024-01-21 DIAGNOSIS — I129 Hypertensive chronic kidney disease with stage 1 through stage 4 chronic kidney disease, or unspecified chronic kidney disease: Secondary | ICD-10-CM | POA: Diagnosis not present

## 2024-01-21 DIAGNOSIS — N186 End stage renal disease: Secondary | ICD-10-CM | POA: Diagnosis not present

## 2024-01-23 ENCOUNTER — Emergency Department (HOSPITAL_COMMUNITY)
Admission: EM | Admit: 2024-01-23 | Discharge: 2024-01-23 | Disposition: A | Discharge status: Home / Self Care | Attending: Emergency Medicine | Admitting: Emergency Medicine

## 2024-01-23 ENCOUNTER — Encounter (HOSPITAL_COMMUNITY): Payer: Self-pay | Admitting: *Deleted

## 2024-01-23 ENCOUNTER — Emergency Department (HOSPITAL_COMMUNITY)

## 2024-01-23 ENCOUNTER — Other Ambulatory Visit: Payer: Self-pay

## 2024-01-23 DIAGNOSIS — N186 End stage renal disease: Secondary | ICD-10-CM | POA: Diagnosis not present

## 2024-01-23 DIAGNOSIS — I132 Hypertensive heart and chronic kidney disease with heart failure and with stage 5 chronic kidney disease, or end stage renal disease: Secondary | ICD-10-CM | POA: Diagnosis not present

## 2024-01-23 DIAGNOSIS — R0602 Shortness of breath: Secondary | ICD-10-CM | POA: Diagnosis present

## 2024-01-23 DIAGNOSIS — Z992 Dependence on renal dialysis: Secondary | ICD-10-CM | POA: Diagnosis not present

## 2024-01-23 DIAGNOSIS — I509 Heart failure, unspecified: Secondary | ICD-10-CM | POA: Insufficient documentation

## 2024-01-23 DIAGNOSIS — R079 Chest pain, unspecified: Secondary | ICD-10-CM | POA: Diagnosis not present

## 2024-01-23 DIAGNOSIS — R918 Other nonspecific abnormal finding of lung field: Secondary | ICD-10-CM | POA: Diagnosis not present

## 2024-01-23 DIAGNOSIS — J811 Chronic pulmonary edema: Secondary | ICD-10-CM | POA: Diagnosis not present

## 2024-01-23 LAB — BASIC METABOLIC PANEL WITH GFR
Anion gap: 17 — ABNORMAL HIGH (ref 5–15)
BUN: 27 mg/dL — ABNORMAL HIGH (ref 8–23)
CO2: 22 mmol/L (ref 22–32)
Calcium: 9.2 mg/dL (ref 8.9–10.3)
Chloride: 85 mmol/L — ABNORMAL LOW (ref 98–111)
Creatinine, Ser: 8.77 mg/dL — ABNORMAL HIGH (ref 0.61–1.24)
GFR, Estimated: 6 mL/min — ABNORMAL LOW (ref >60)
Glucose, Bld: 88 mg/dL (ref 70–99)
Potassium: 4.1 mmol/L (ref 3.5–5.1)
Sodium: 124 mmol/L — ABNORMAL LOW (ref 135–145)

## 2024-01-23 LAB — RESP PANEL BY RT-PCR (RSV, FLU A&B, COVID)  RVPGX2
Influenza A by PCR: NEGATIVE
Influenza B by PCR: NEGATIVE
Resp Syncytial Virus by PCR: NEGATIVE
SARS Coronavirus 2 by RT PCR: NEGATIVE

## 2024-01-23 LAB — CBC
HCT: 32.6 % — ABNORMAL LOW (ref 39.0–52.0)
Hemoglobin: 10.6 g/dL — ABNORMAL LOW (ref 13.0–17.0)
MCH: 28.4 pg (ref 26.0–34.0)
MCHC: 32.5 g/dL (ref 30.0–36.0)
MCV: 87.4 fL (ref 80.0–100.0)
Platelets: 150 K/uL (ref 150–400)
RBC: 3.73 MIL/uL — ABNORMAL LOW (ref 4.22–5.81)
RDW: 14.2 % (ref 11.5–15.5)
WBC: 5.4 K/uL (ref 4.0–10.5)
nRBC: 0 % (ref 0.0–0.2)

## 2024-01-23 LAB — TROPONIN I (HIGH SENSITIVITY)
Troponin I (High Sensitivity): 53 ng/L — ABNORMAL HIGH (ref <18)
Troponin I (High Sensitivity): 53 ng/L — ABNORMAL HIGH (ref <18)

## 2024-01-23 LAB — BRAIN NATRIURETIC PEPTIDE: B Natriuretic Peptide: 956.7 pg/mL — ABNORMAL HIGH (ref 0.0–100.0)

## 2024-01-23 MED ORDER — FUROSEMIDE 10 MG/ML IJ SOLN
60.0000 mg | Freq: Once | INTRAMUSCULAR | Status: AC
  Administered 2024-01-23: 60 mg via INTRAVENOUS
  Filled 2024-01-23: qty 6

## 2024-01-23 NOTE — ED Notes (Signed)
 Pt provided discharge instructions and prescription information. Pt was given the opportunity to ask questions and questions were answered.

## 2024-01-23 NOTE — Discharge Instructions (Signed)
 You were seen in the emergency department for shortness of breath Your chest x-ray did show some congestion but you did not need any extra oxygen from us  and you were able to walk around without your oxygen dropping There is no evidence of heart attack today It is important that you attend your dialysis schedule tomorrow as it may need to take off more volume with your pulmonary edema Return to the emergency room for chest pain trouble breathing or any other concerns

## 2024-01-23 NOTE — ED Provider Notes (Signed)
 [place] EMERGENCY DEPARTMENT AT [facility] Provider Note   CSN: [id number] Arrival date & time: 01/23/24  1654     Patient presents with: Shortness of Breath   Craig Flynn is a 63 y.o. male.  With a history of ESRD on dialysis (MWF) and heart failure who presents to the ED for shortness of breath.  Patient works as an Biomedical scientist and reports increased shortness of breath beginning today.  Also reporting left-sided chest pain.  Made worse with exertion.  No fevers chills.  Some productive coughing.  No nausea vomiting abdominal pain.  Attends dialysis as scheduled.  Compliant with Lasix .  No appreciable increased peripheral edema    Shortness of Breath      Prior to Admission medications   Medication Sig Start Date End Date Taking? Authorizing Provider  acetaminophen  (TYLENOL ) 500 MG tablet Take 1,000 mg by mouth daily as needed for moderate pain (pain score 4-6) or mild pain (pain score 1-3).    [provider]  albuterol  (VENTOLIN  HFA) 108 (90 Base) MCG/ACT inhaler Inhale 2 puffs into the lungs every 6 (six) hours as needed for wheezing or shortness of breath. 09/02/23   Alvia Corean CROME, FNP  amLODipine  (NORVASC ) 5 MG tablet Take 1 tablet (5 mg total) by mouth daily. 09/28/23   Amin, Ankit C, MD  b complex-vitamin c-folic acid  (NEPHRO-VITE) 0.8 MG TABS tablet Take 1 tablet by mouth daily.    [provider]  calcitRIOL  (ROCALTROL ) 0.5 MCG capsule Take 8 capsules (4 mcg total) by mouth every Monday, Wednesday, and Friday with hemodialysis. 09/29/23   Amin, Ankit C, MD  cinacalcet  (SENSIPAR ) 30 MG tablet Take 3 tablets (90 mg total) by mouth daily with supper. 09/28/23   Amin, Ankit C, MD  clopidogrel  (PLAVIX ) 75 MG tablet Take 1 tablet (75 mg total) by mouth daily. 09/28/23 09/27/24  Caleen Burgess BROCKS, MD  cyanocobalamin  (VITAMIN B12) 1000 MCG tablet Take 1,000 mcg by mouth daily.    [provider]  furosemide  (LASIX ) 80 MG tablet Take 1 tablet (80 mg  total) by mouth daily. 09/28/23   Amin, Ankit C, MD  gabapentin  (NEURONTIN ) 100 MG capsule Take 100 mg by mouth at bedtime. 09/17/23   [provider]  isosorbide  mononitrate (IMDUR ) 60 MG 24 hr tablet Take 1 tablet (60 mg total) by mouth daily. 09/28/23   Amin, Ankit C, MD  latanoprost (XALATAN) 0.005 % ophthalmic solution Place 1 drop into the right eye at bedtime.    [provider]  levothyroxine  (SYNTHROID ) 125 MCG tablet TAKE 1 TABLET(125 MCG) BY MOUTH EVERY MORNING 05/06/23   Norleen Agent ORN, MD  nitroGLYCERIN  (NITROSTAT ) 0.4 MG SL tablet DISSOLVE 1 TABLET UNDER THE TONGUE EVERY 5 MINUTES AS NEEDED FOR CHEST PAIN Patient taking differently: Place 0.4 mg under the tongue every 5 (five) minutes as needed for chest pain. 03/29/19   Jesus Elspeth Sieving, MD  pantoprazole  (PROTONIX ) 40 MG tablet Take 1 tablet (40 mg total) by mouth daily. Patient taking differently: Take 80 mg by mouth daily. 10/21/21   Norleen Agent ORN, MD  sevelamer  carbonate (RENVELA ) 800 MG tablet Take 800 mg by mouth in the morning and at bedtime.    [provider]    Allergies: Patient has no known allergies.    Review of Systems  Respiratory:  Positive for shortness of breath.     Updated Vital Signs BP (!) 180/91   Pulse 79   Temp 98.1 F (36.7  C) (Oral)   Resp 17   Ht 6' 2 (1.88 m)   Wt 126.2 kg   SpO2 99%   BMI 35.72 kg/m   Physical Exam Vitals and nursing note reviewed.  HENT:     Head: Normocephalic and atraumatic.  Eyes:     Pupils: Pupils are equal, round, and reactive to light.  Cardiovascular:     Rate and Rhythm: Normal rate and regular rhythm.  Pulmonary:     Effort: Pulmonary effort is normal.     Breath sounds: Normal breath sounds.  Abdominal:     Palpations: Abdomen is soft.     Tenderness: There is no abdominal tenderness.  Musculoskeletal:     Right lower leg: No edema.     Left lower leg: No edema.  Skin:    General: Skin is warm and dry.  Neurological:      Mental Status: He is alert.  Psychiatric:        Mood and Affect: Mood normal.     (all labs ordered are listed, but only abnormal results are displayed) Labs Reviewed  BASIC METABOLIC PANEL WITH GFR - Abnormal; Notable for the following components:      Result Value   Sodium 124 (*)    Chloride 85 (*)    BUN 27 (*)    Creatinine, Ser 8.77 (*)    GFR, Estimated 6 (*)    Anion gap 17 (*)    All other components within normal limits  CBC - Abnormal; Notable for the following components:   RBC 3.73 (*)    Hemoglobin 10.6 (*)    HCT 32.6 (*)    All other components within normal limits  BRAIN NATRIURETIC PEPTIDE - Abnormal; Notable for the following components:   B Natriuretic Peptide 956.7 (*)    All other components within normal limits  TROPONIN I (HIGH SENSITIVITY) - Abnormal; Notable for the following components:   Troponin I (High Sensitivity) 53 (*)    All other components within normal limits  TROPONIN I (HIGH SENSITIVITY) - Abnormal; Notable for the following components:   Troponin I (High Sensitivity) 53 (*)    All other components within normal limits  RESP PANEL BY RT-PCR (RSV, FLU A&B, COVID)  [id number]    EKG: None  Radiology: DG Chest 2 View Result Date: 01/23/2024 EXAM: 2 VIEW(S) XRAY OF THE CHEST 01/23/2024 05:48:00 PM COMPARISON: 09/25/2023 CLINICAL HISTORY: chest pain for 2 days hx chf FINDINGS: LUNGS AND PLEURA: Increased bibasilar opacities are noted concerning for worsening edema. Possible small pleural effusions. No pneumothorax. HEART AND MEDIASTINUM: No acute abnormality of the cardiac and mediastinal silhouettes. BONES AND SOFT TISSUES: No acute osseous abnormality. IMPRESSION: 1. Mild pulmonary edema with increased bibasilar opacities. 2. Possible small bilateral pleural effusions. Electronically signed by: Lynwood Seip MD 01/23/2024 06:02 PM EST RP Workstation: [id number]     Procedures   Medications Ordered in the ED  furosemide  (LASIX ) injection 60  mg (60 mg Intravenous Given 01/23/24 1833)    Clinical Course as of 01/23/24 2130  Sun Jan 23, 2024  2127 Troponin flat at 53.  Hyponatremia slight hypochloremia BNP elevated.  Some congestion on chest x-ray however patient has remained stable here on room air.  Was able to walk with oxygen saturation in the mid 90s throughout the hall.  Will likely benefit from volume removal at dialysis tomorrow.  Stable for discharge at this time [MP]    Clinical Course User Index [MP] Pamella Ozell LABOR, DO  Medical Decision Making 62 year old male with history as above presented to the ED for chest pain shortness of breath.  Compliant with dialysis and medications at home including Lasix .  Afebrile.  Oxygenating well at rest on room air.  Hypertensive.  Most likely etiology would be heart failure exacerbation but will evaluate for pneumonia viral respiratory illness and ACS given reported chest pain and risk factors.  Will continue to monitor on telemetry.  Will add supplemental oxygen as needed and provide dose of Lasix  here  Amount and/or Complexity of Data Reviewed Labs: ordered. Radiology: ordered.  Risk Prescription drug management.        Final diagnoses:  Acute exacerbation of chronic heart failure (HCC)  ESRD on dialysis [facility])    ED Discharge Orders     None          Pamella Ozell LABOR, DO 01/23/24 2131

## 2024-01-23 NOTE — ED Notes (Signed)
 Patient transported to X-ray

## 2024-01-23 NOTE — ED Triage Notes (Signed)
 The pt is also a dialysis pt with a fistula lt arm   last dialysis was friday

## 2024-01-23 NOTE — ED Triage Notes (Signed)
 The pt has had lt sided chest pain for 2 days he also has exertional sob

## 2024-01-24 DIAGNOSIS — N186 End stage renal disease: Secondary | ICD-10-CM | POA: Diagnosis not present

## 2024-01-24 DIAGNOSIS — Z992 Dependence on renal dialysis: Secondary | ICD-10-CM | POA: Diagnosis not present

## 2024-01-24 DIAGNOSIS — N2581 Secondary hyperparathyroidism of renal origin: Secondary | ICD-10-CM | POA: Diagnosis not present

## 2024-01-26 ENCOUNTER — Telehealth: Payer: Self-pay

## 2024-01-26 DIAGNOSIS — N2581 Secondary hyperparathyroidism of renal origin: Secondary | ICD-10-CM | POA: Diagnosis not present

## 2024-01-26 DIAGNOSIS — Z992 Dependence on renal dialysis: Secondary | ICD-10-CM | POA: Diagnosis not present

## 2024-01-26 NOTE — Transitions of Care (Post Inpatient/ED Visit) (Signed)
   01/26/2024  Name: Craig Flynn MRN: [id number] DOB: Feb 24, 1961  Today's TOC FU Call Status:    Attempted to reach the patient regarding the most recent Inpatient/ED visit.  Follow Up Plan: No further outreach attempts will be made at this time. We have been unable to contact the patient. Pt has already been schedule for follow up on 02/01/24  Signature Ronnald Palms

## 2024-01-31 DIAGNOSIS — N186 End stage renal disease: Secondary | ICD-10-CM | POA: Diagnosis not present

## 2024-02-01 ENCOUNTER — Ambulatory Visit: Admitting: Internal Medicine

## 2024-02-01 ENCOUNTER — Encounter: Payer: Self-pay | Admitting: Internal Medicine

## 2024-02-01 VITALS — BP 148/76 | HR 63 | Temp 98.2°F | Ht 74.0 in | Wt 275.0 lb

## 2024-02-01 DIAGNOSIS — G4710 Hypersomnia, unspecified: Secondary | ICD-10-CM | POA: Insufficient documentation

## 2024-02-01 DIAGNOSIS — E785 Hyperlipidemia, unspecified: Secondary | ICD-10-CM

## 2024-02-01 DIAGNOSIS — M1711 Unilateral primary osteoarthritis, right knee: Secondary | ICD-10-CM | POA: Diagnosis not present

## 2024-02-01 DIAGNOSIS — I10 Essential (primary) hypertension: Secondary | ICD-10-CM

## 2024-02-01 MED ORDER — FUROSEMIDE 80 MG PO TABS: 80.0000 mg | ORAL_TABLET | Freq: Two times a day (BID) | ORAL | 5 refills | Status: AC

## 2024-02-01 MED ORDER — IBUPROFEN 800 MG PO TABS: 800.0000 mg | ORAL_TABLET | Freq: Every day | ORAL | 2 refills | Status: DC | PRN

## 2024-02-01 MED ORDER — NITROGLYCERIN 0.4 MG SL SUBL
SUBLINGUAL_TABLET | SUBLINGUAL | 3 refills | Status: AC
Start: 2024-02-01 — End: ?

## 2024-02-01 NOTE — Assessment & Plan Note (Signed)
 With recent worsening, can't r/o osa - for pulmonary referral

## 2024-02-01 NOTE — Assessment & Plan Note (Signed)
 Pt for refill ibuprofen  with sparing use for right knee arthritis

## 2024-02-01 NOTE — Progress Notes (Signed)
 Patient ID: Craig Flynn, male   DOB: 06-10-1960, 63 y.o.   MRN: [id number]        Chief Complaint: follow up htn, edema, hypersomnolence, right knee DJD       HPI:  Craig Flynn is a 63 y.o. male here overall doing ok,  Pt denies chest pain, increased sob or doe, wheezing, orthopnea, PND, palpitations, dizziness or syncope but is asking for lasix  80 bid for better control, as he does make urine despite need for HD M-W-F.  Was seen at ED nov 2 with o/w stable labs.  Pt states BP always mildly high, and declines further change today.  Does have ongoing hypersomnolence and sleeps during HD x 3 hrs during the daytime which affects his sleep at night.  Asks for r/o OSA.  Also difficult to lose wt - has ongoing right knee arthritis, takes ibuprofen  sparingly, but so far unable to get the wt down to 225 in order to have the right knee TKR he needs.  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 10/2013: EF reduced to 35-40% b. 09/2014: EF improved to 55-60%, Grade 1 DD noted.   Chest pain 06/2015   CHF (congestive heart failure) (HCC)    CKD (chronic kidney disease), stage IV [facility])    sees  Dr Betsey   Dyspnea    with exerion    GERD (gastroesophageal reflux disease)    Glaucoma    Gout    Hypertension    Obesity    Pancreatitis    Sleep apnea    not wearing c-pap now, needs new slep study per pt. (01/22/2017)   Tuberculosis 1981    while the pt was in the service.   Tubular adenoma of colon 10/2015   Past Surgical History:  Procedure Laterality Date   AV FISTULA PLACEMENT Left 03/25/2016   Procedure: Creation of Left Arm ARTERIOVENOUS (AV) FISTULA;  Surgeon: Carlin FORBES Haddock, MD;  Location: [facility] OR;  Service: Vascular;  Laterality: Left;   AV FISTULA PLACEMENT Left 08/27/2020   Procedure: LEFT BASILIC VEIN ARTERIOVENOUS (AV) FISTULA CREATION;  Surgeon: Oris Krystal FALCON, MD;  Location: [facility] OR;  Service: Vascular;  Laterality: Left;   BASCILIC VEIN TRANSPOSITION Left 10/22/2020   Procedure: LEFT SECOND STAGE BASILIC VEIN TRANSPOSITION;  Surgeon: Haddock Carlin FORBES, MD;  Location: [facility] OR;  Service: Vascular;  Laterality: Left;   COLONOSCOPY W/ BIOPSIES AND POLYPECTOMY     no problem   CORONARY BALLOON ANGIOPLASTY N/A 09/27/2023   Procedure: CORONARY BALLOON ANGIOPLASTY;  Surgeon: Ladona Heinz, MD;  Location: [facility] INVASIVE CV LAB;  Service: Cardiovascular;  Laterality: N/A;   HERNIA REPAIR     IR FLUORO GUIDE CV LINE RIGHT  08/22/2020   IR US  GUIDE VASC ACCESS RIGHT  08/22/2020   LAPAROSCOPIC CHOLECYSTECTOMY     LEFT HEART CATH AND CORONARY ANGIOGRAPHY N/A 09/27/2023   Procedure: LEFT HEART CATH AND CORONARY ANGIOGRAPHY;  Surgeon: Ladona Heinz, MD;  Location: [facility] INVASIVE CV LAB;  Service: Cardiovascular;  Laterality: N/A;   LIGATION OF COMPETING BRANCHES OF ARTERIOVENOUS FISTULA Left 01/25/2018   Procedure: LIGATION OF COMPETING BRANCHES And Revision of ARTERIOVENOUS FISTULA LEFT ARM.;  Surgeon: Harvey Carlin BRAVO, MD;  Location: [facility] OR;  Service: Vascular;  Laterality: Left;   THYROIDECTOMY  01/22/2017   THYROIDECTOMY Left 01/22/2017   Procedure: THYROIDECTOMY;  Surgeon: Carlie Clark, MD;  Location: [facility] OR;  Service: ENT;  Laterality: Left;   THYROIDECTOMY Right 03/26/2017   Procedure: COMPLETION OF THYROIDECTOMY;  Surgeon: Carlie Clark, MD;  Location: [facility] OR;  Service: ENT;  Laterality: Right;   UMBILICAL HERNIA REPAIR     WISDOM TOOTH  EXTRACTION      reports that he quit smoking about 22 months ago. His smoking use included cigarettes. He started smoking about 41 years ago. He has never used smokeless tobacco. He reports that he does not currently use alcohol. He reports that he does not use drugs. family history includes Diabetes in his mother; Healthy in his maternal grandfather, paternal grandfather, and paternal grandmother; Hypertension in his maternal grandmother; Kidney disease in his maternal grandmother; Pneumonia in his father; Stroke in his mother. No Known Allergies Current Outpatient Medications on File Prior to Visit  Medication Sig Dispense Refill   acetaminophen  (TYLENOL ) 500 MG tablet Take 1,000 mg by mouth daily as needed for moderate pain (pain score 4-6) or mild pain (pain score 1-3).     albuterol  (VENTOLIN  HFA) 108 (90 Base) MCG/ACT inhaler Inhale 2 puffs into the lungs every 6 (six) hours as needed for wheezing or shortness of breath. 18 g 1   amLODipine  (NORVASC ) 5 MG tablet Take 1 tablet (5 mg total) by mouth daily. 90 tablet 0   b complex-vitamin c-folic acid  (NEPHRO-VITE) 0.8 MG TABS tablet Take 1 tablet by mouth daily.     calcitRIOL  (ROCALTROL ) 0.5 MCG capsule Take 8 capsules (4 mcg total) by mouth every Monday, Wednesday, and Friday with hemodialysis.     cinacalcet  (SENSIPAR ) 30 MG tablet Take 3 tablets (90 mg total) by mouth daily with supper.     clopidogrel  (PLAVIX ) 75 MG tablet Take 1 tablet (75 mg total) by mouth daily. 90 tablet 3   cyanocobalamin  (VITAMIN B12) 1000 MCG tablet Take 1,000 mcg by mouth daily.     furosemide  (LASIX ) 80 MG tablet Take 1 tablet (80 mg total) by mouth daily. 30 tablet 0   gabapentin  (NEURONTIN ) 100 MG capsule Take 100 mg by mouth at bedtime.     isosorbide  mononitrate (IMDUR ) 60 MG 24 hr tablet Take 1 tablet (60 mg total) by mouth daily. 30 tablet 0   latanoprost (XALATAN) 0.005 % ophthalmic solution Place 1 drop into the right eye at bedtime.     levothyroxine   (SYNTHROID ) 125 MCG tablet TAKE 1 TABLET(125 MCG) BY MOUTH EVERY MORNING 90 tablet 3   Methoxy PEG-Epoetin Beta (MIRCERA IJ) 75 mcg.     Methoxy PEG-Epoetin Beta (MIRCERA IJ) 30 mcg.     nitroGLYCERIN  (NITROSTAT ) 0.4 MG SL tablet DISSOLVE 1 TABLET UNDER THE TONGUE EVERY 5 MINUTES AS NEEDED FOR CHEST PAIN (Patient taking differently: Place 0.4 mg under the tongue every 5 (five) minutes as needed for chest pain.) 25 tablet 3   pantoprazole  (PROTONIX ) 40 MG tablet Take 1 tablet (  40 mg total) by mouth daily. (Patient taking differently: Take 80 mg by mouth daily.) 90 tablet 3   sevelamer  carbonate (RENVELA ) 800 MG tablet Take 800 mg by mouth in the morning and at bedtime.     Tuberculin PPD (TUBERSOL ID) Inject 0.1 mLs into the skin.     No current facility-administered medications on file prior to visit.        ROS:  All others reviewed and negative.  Objective        PE:  BP (!) 148/76 (BP Location: Right Arm, Patient Position: Sitting, Cuff Size: Normal)   Pulse 63   Temp 98.2 F (36.8 C) (Oral)   Ht 6' 2 (1.88 m)   Wt 275 lb (124.7 kg)   SpO2 98%   BMI 35.31 kg/m                 Constitutional: Pt appears in NAD               HENT: Head: NCAT.                Right Ear: External ear normal.                 Left Ear: External ear normal.                Eyes: . Pupils are equal, round, and reactive to light. Conjunctivae and EOM are normal               Nose: without d/c or deformity               Neck: Neck supple. Gross normal ROM               Cardiovascular: Normal rate and regular rhythm.                 Pulmonary/Chest: Effort normal and breath sounds without rales or wheezing.                Abd:  Soft, NT, ND, + BS, no organomegaly               Neurological: Pt is alert. At baseline orientation, motor grossly intact               Skin: Skin is warm. No rashes, no other new lesions, LE edema - trace bilateral ankle               Psychiatric: Pt behavior is normal without  agitation   Micro: none  Cardiac tracings I have personally interpreted today:  none  Pertinent Radiological findings (summarize): none   Lab Results  Component Value Date   WBC 5.4 01/23/2024   HGB 10.6 (L) 01/23/2024   HCT 32.6 (L) 01/23/2024   PLT 150 01/23/2024   GLUCOSE 88 01/23/2024   CHOL 105 09/26/2023   TRIG 91 09/26/2023   HDL 32 (L) 09/26/2023   LDLCALC 55 09/26/2023   ALT 15 09/26/2023   AST 13 (L) 09/26/2023   NA 124 (L) 01/23/2024   K 4.1 01/23/2024   CL 85 (L) 01/23/2024   CREATININE 8.77 (H) 01/23/2024   BUN 27 (H) 01/23/2024   CO2 22 01/23/2024   TSH 1.64 07/15/2023   PSA 0.46 07/15/2023   INR 1.1 05/09/2021   HGBA1C 5.5 09/26/2023   Assessment/Plan:  Craig Flynn is a 63 y.o. Black or African American [2] male with  has a past medical history of Acute pancreatitis (08/22/2020),  Acute systolic CHF (congestive heart failure) (HCC) (11/17/2013), Anemia (06/2015), Anemia due to acquired thiamine deficiency (03/25/2020), Arthritis, Bacterial sinusitis (10/16/2019), Cancer (HCC), Cardiomyopathy (HCC), Chest pain (06/2015), CHF (congestive heart failure) (HCC), CKD (chronic kidney disease), stage IV (HCC), Dyspnea, GERD (gastroesophageal reflux disease), Glaucoma, Gout, Hypertension, Obesity, Pancreatitis, Sleep apnea, Tuberculosis (1981), and Tubular adenoma of colon (10/2015).  No problem-specific Assessment & Plan notes found for this encounter.  Followup: No follow-ups on file.  Lynwood Rush, MD 02/01/2024 9:50 AM [facility]

## 2024-02-01 NOTE — Assessment & Plan Note (Signed)
 Chronic mild elevated, ok for lasix  80 bid but declines other change

## 2024-02-01 NOTE — Patient Instructions (Signed)
 Ok for the increased lasix  to 80 mg twice per day  Please continue all other medications as before, and refills have been done for the ibuprofen   Please have the pharmacy call with any other refills you may need.  Please continue your efforts at being more active, low cholesterol diet, and weight control.  Please keep your appointments with your specialists as you may have planned  You will be contacted regarding the referral for: Pulmonary for the possible sleep apnea  We can hold on other lab test today  Please make an Appointment to return in 6 months, or sooner if needed

## 2024-02-01 NOTE — Assessment & Plan Note (Addendum)
 Lab Results  Component Value Date   LDLCALC 55 09/26/2023   Stable, pt to continue  - diet, wt control

## 2024-02-02 DIAGNOSIS — N2581 Secondary hyperparathyroidism of renal origin: Secondary | ICD-10-CM | POA: Diagnosis not present

## 2024-02-02 DIAGNOSIS — Z992 Dependence on renal dialysis: Secondary | ICD-10-CM | POA: Diagnosis not present

## 2024-02-02 DIAGNOSIS — N186 End stage renal disease: Secondary | ICD-10-CM | POA: Diagnosis not present

## 2024-02-04 DIAGNOSIS — Z992 Dependence on renal dialysis: Secondary | ICD-10-CM | POA: Diagnosis not present

## 2024-02-09 DIAGNOSIS — N2581 Secondary hyperparathyroidism of renal origin: Secondary | ICD-10-CM | POA: Diagnosis not present

## 2024-02-09 DIAGNOSIS — N186 End stage renal disease: Secondary | ICD-10-CM | POA: Diagnosis not present

## 2024-02-09 DIAGNOSIS — Z992 Dependence on renal dialysis: Secondary | ICD-10-CM | POA: Diagnosis not present

## 2024-02-11 DIAGNOSIS — Z992 Dependence on renal dialysis: Secondary | ICD-10-CM | POA: Diagnosis not present

## 2024-02-11 DIAGNOSIS — N186 End stage renal disease: Secondary | ICD-10-CM | POA: Diagnosis not present

## 2024-02-11 DIAGNOSIS — N2581 Secondary hyperparathyroidism of renal origin: Secondary | ICD-10-CM | POA: Diagnosis not present

## 2024-02-15 DIAGNOSIS — Z992 Dependence on renal dialysis: Secondary | ICD-10-CM | POA: Diagnosis not present

## 2024-02-15 DIAGNOSIS — N2581 Secondary hyperparathyroidism of renal origin: Secondary | ICD-10-CM | POA: Diagnosis not present

## 2024-02-18 DIAGNOSIS — N2581 Secondary hyperparathyroidism of renal origin: Secondary | ICD-10-CM | POA: Diagnosis not present

## 2024-02-18 DIAGNOSIS — Z992 Dependence on renal dialysis: Secondary | ICD-10-CM | POA: Diagnosis not present

## 2024-02-18 DIAGNOSIS — N186 End stage renal disease: Secondary | ICD-10-CM | POA: Diagnosis not present

## 2024-02-20 DIAGNOSIS — Z992 Dependence on renal dialysis: Secondary | ICD-10-CM | POA: Diagnosis not present

## 2024-02-20 DIAGNOSIS — I129 Hypertensive chronic kidney disease with stage 1 through stage 4 chronic kidney disease, or unspecified chronic kidney disease: Secondary | ICD-10-CM | POA: Diagnosis not present

## 2024-02-20 DIAGNOSIS — N186 End stage renal disease: Secondary | ICD-10-CM | POA: Diagnosis not present

## 2024-02-24 ENCOUNTER — Ambulatory Visit: Admitting: Pulmonary Disease

## 2024-02-24 ENCOUNTER — Encounter: Payer: Self-pay | Admitting: Pulmonary Disease

## 2024-02-24 VITALS — BP 145/82 | HR 73 | Temp 98.6°F | Ht 74.0 in | Wt 279.4 lb

## 2024-02-24 DIAGNOSIS — G479 Sleep disorder, unspecified: Secondary | ICD-10-CM

## 2024-02-24 DIAGNOSIS — F17211 Nicotine dependence, cigarettes, in remission: Secondary | ICD-10-CM

## 2024-02-24 NOTE — Progress Notes (Signed)
 Synopsis: Referred in 01/2024 for excessive daytime sleepiness by Norleen Flynn ORN, MD  Subjective:   PATIENT ID: Craig Flynn GENDER: male DOB: 1960/08/07, MRN: [id number]  Chief Complaint  Patient presents with   Consult    SOB occurs w/ any activity Pt last sleep study was in 2017, currently not on CPAP    HPI  Craig Flynn is a 63 y.o. 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  The patient reports frequent awakenings from sleep, nocturia several times per night (in spite of being on intermittent HD), and excessive daytime sleepiness. He takes naps on weekends and sleeps through [facility] as well.  He was previously diagnosed with OSA in 2017 but he later was told he no longer needed CPAP after he lost 50 lbs. Now his symptoms have returned increasing his suspicion that his OSA has returned. Probable trigger is weight regain per his report. Endorses snoring.  He is in the process of getting a kidney transplant per his report.  While he quit smoking for 22 months, he reports he has returned to smoking a few cigarettes per day.    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 10/2013: EF reduced to 35-40% b. 09/2014: EF improved to 55-60%, Grade 1 DD noted.   Chest pain 06/2015   CHF (congestive heart failure) (HCC)    CKD (chronic kidney disease), stage IV [facility])    sees  Dr Betsey   Dyspnea    with exerion    GERD (gastroesophageal reflux disease)    Glaucoma    Gout    Hypertension    Obesity     Pancreatitis    Sleep apnea    not wearing c-pap now, needs new slep study per pt. (01/22/2017)   Tuberculosis 1981   while the pt was in the service.   Tubular adenoma of colon 10/2015     Family History  Problem Relation Age of Onset   Diabetes Mother    Stroke Mother    Pneumonia Father        died of Pneumonia x3   Kidney disease Maternal Grandmother    Hypertension Maternal Grandmother    Healthy Maternal Grandfather    Healthy Paternal Grandmother    Healthy Paternal Grandfather    CAD Neg Hx    Colon cancer Neg Hx    Esophageal cancer Neg Hx    Pancreatic cancer Neg Hx    Prostate cancer Neg Hx    Stomach cancer Neg Hx    Rectal cancer Neg Hx      Past Surgical History:  Procedure Laterality Date   AV FISTULA PLACEMENT Left 03/25/2016   Procedure: Creation of Left Arm ARTERIOVENOUS (AV) FISTULA;  Surgeon: Carlin FORBES Haddock, MD;  Location: [facility] OR;  Service: Vascular;  Laterality: Left;   AV FISTULA PLACEMENT Left 08/27/2020   Procedure: LEFT BASILIC VEIN ARTERIOVENOUS (AV) FISTULA CREATION;  Surgeon: Oris Krystal FALCON, MD;  Location: [facility] OR;  Service: Vascular;  Laterality: Left;  BASCILIC VEIN TRANSPOSITION Left 10/22/2020   Procedure: LEFT SECOND STAGE BASILIC VEIN TRANSPOSITION;  Surgeon: Harvey Carlin BRAVO, MD;  Location: [facility] OR;  Service: Vascular;  Laterality: Left;   COLONOSCOPY W/ BIOPSIES AND POLYPECTOMY     no problem   CORONARY BALLOON ANGIOPLASTY N/A 09/27/2023   Procedure: CORONARY BALLOON ANGIOPLASTY;  Surgeon: Ladona Heinz, MD;  Location: [facility] INVASIVE CV LAB;  Service: Cardiovascular;  Laterality: N/A;   HERNIA REPAIR     IR FLUORO GUIDE CV LINE RIGHT  08/22/2020   IR US  GUIDE VASC ACCESS RIGHT  08/22/2020   LAPAROSCOPIC CHOLECYSTECTOMY     LEFT HEART CATH AND CORONARY ANGIOGRAPHY N/A 09/27/2023   Procedure: LEFT HEART CATH AND CORONARY ANGIOGRAPHY;  Surgeon: Ladona Heinz, MD;  Location: [facility] INVASIVE CV LAB;  Service: Cardiovascular;  Laterality: N/A;   LIGATION OF  COMPETING BRANCHES OF ARTERIOVENOUS FISTULA Left 01/25/2018   Procedure: LIGATION OF COMPETING BRANCHES And Revision of ARTERIOVENOUS FISTULA LEFT ARM.;  Surgeon: Harvey Carlin BRAVO, MD;  Location: [facility] OR;  Service: Vascular;  Laterality: Left;   THYROIDECTOMY  01/22/2017   THYROIDECTOMY Left 01/22/2017   Procedure: THYROIDECTOMY;  Surgeon: Carlie Clark, MD;  Location: [facility] OR;  Service: ENT;  Laterality: Left;   THYROIDECTOMY Right 03/26/2017   Procedure: COMPLETION OF THYROIDECTOMY;  Surgeon: Carlie Clark, MD;  Location: [facility] OR;  Service: ENT;  Laterality: Right;   UMBILICAL HERNIA REPAIR     WISDOM TOOTH EXTRACTION      Social History   Socioeconomic History   Marital status: Divorced    Spouse name: Not on file   Number of children: 2   Years of education: 12   Highest education level: Not on file  Occupational History   Occupation: Disability    Comment: Knees   Tobacco Use   Smoking status: Former    Current packs/day: 0.00    Types: Cigarettes    Start date: 03/16/1982    Quit date: 03/16/2022    Years since quitting: 1.9   Smokeless tobacco: Never   Tobacco comments:    1/2 pack per day (03/25/2021)    Patient stated he quit smoking on 03/16/2022  Vaping Use   Vaping status: Never Used  Substance and Sexual Activity   Alcohol use: Not Currently    Alcohol/week: 0.0 standard drinks of alcohol   Drug use: No   Sexual activity: Yes  Other Topics Concern   Not on file  Social History Narrative   Fun: Golf, basketball    Denies religious beliefs effecting health care.       Right Handed    Drinks no caffeine    Lives in a one story home    Social Drivers of Health   Financial Resource Strain: Low Risk  (07/06/2023)   Overall Financial Resource Strain (CARDIA)    Difficulty of Paying Living Expenses: Not hard at all  Food Insecurity: No Food Insecurity (09/30/2023)   Hunger Vital Sign    Worried About Running Out of Food in the Last Year: Never true    Ran Out of Food  in the Last Year: Never true  Transportation Needs: No Transportation Needs (09/30/2023)   PRAPARE - Administrator, Civil Service (Medical): No    Lack of Transportation (Non-Medical): No  Physical Activity: Sufficiently Active (07/06/2023)   Exercise Vital Sign    Days of Exercise per Week: 6 days    Minutes of Exercise per Session: 30 min  Stress: No Stress  Concern Present (07/06/2023)   [employer] of Occupational Health - Occupational Stress Questionnaire    Feeling of Stress : Not at all  Social Connections: Moderately Isolated (07/06/2023)   Social Connection and Isolation Panel    Frequency of Communication with Friends and Family: More than three times a week    Frequency of Social Gatherings with Friends and Family: Twice a week    Attends Religious Services: More than 4 times per year    Active Member of [employer] or Organizations: No    Attends Banker Meetings: Never    Marital Status: Widowed  Intimate Partner Violence: Not At Risk (09/30/2023)   Humiliation, Afraid, Rape, and Kick questionnaire    Fear of Current or Ex-Partner: No    Emotionally Abused: No    Physically Abused: No    Sexually Abused: No     No Known Allergies   Outpatient Medications Prior to Visit  Medication Sig Dispense Refill   acetaminophen  (TYLENOL ) 500 MG tablet Take 1,000 mg by mouth daily as needed for moderate pain (pain score 4-6) or mild pain (pain score 1-3).     albuterol  (VENTOLIN  HFA) 108 (90 Base) MCG/ACT inhaler Inhale 2 puffs into the lungs every 6 (six) hours as needed for wheezing or shortness of breath. 18 g 1   amLODipine  (NORVASC ) 5 MG tablet Take 1 tablet (5 mg total) by mouth daily. 90 tablet 0   b complex-vitamin c-folic acid  (NEPHRO-VITE) 0.8 MG TABS tablet Take 1 tablet by mouth daily.     calcitRIOL  (ROCALTROL ) 0.5 MCG capsule Take 8 capsules (4 mcg total) by mouth every Monday, Wednesday, and Friday with hemodialysis.     clopidogrel  (PLAVIX ) 75  MG tablet Take 1 tablet (75 mg total) by mouth daily. 90 tablet 3   cyanocobalamin  (VITAMIN B12) 1000 MCG tablet Take 1,000 mcg by mouth daily.     furosemide  (LASIX ) 80 MG tablet Take 1 tablet (80 mg total) by mouth 2 (two) times daily. 60 tablet 5   gabapentin  (NEURONTIN ) 100 MG capsule Take 100 mg by mouth at bedtime.     ibuprofen  (ADVIL ) 800 MG tablet Take 1 tablet (800 mg total) by mouth daily as needed for moderate pain (pain score 4-6). 30 tablet 2   isosorbide  mononitrate (IMDUR ) 60 MG 24 hr tablet Take 1 tablet (60 mg total) by mouth daily. 30 tablet 0   levothyroxine  (SYNTHROID ) 125 MCG tablet TAKE 1 TABLET(125 MCG) BY MOUTH EVERY MORNING 90 tablet 3   Methoxy PEG-Epoetin Beta (MIRCERA IJ) 75 mcg.     nitroGLYCERIN  (NITROSTAT ) 0.4 MG SL tablet DISSOLVE 1 TABLET UNDER THE TONGUE EVERY 5 MINUTES AS NEEDED FOR CHEST PAIN 25 tablet 3   Tuberculin PPD (TUBERSOL ID) Inject 0.1 mLs into the skin.     cinacalcet  (SENSIPAR ) 30 MG tablet Take 3 tablets (90 mg total) by mouth daily with supper. (Patient not taking: Reported on 02/24/2024)     latanoprost (XALATAN) 0.005 % ophthalmic solution Place 1 drop into the right eye at bedtime. (Patient not taking: Reported on 02/24/2024)     Methoxy PEG-Epoetin Beta (MIRCERA IJ) 30 mcg. (Patient not taking: Reported on 02/24/2024)     pantoprazole  (PROTONIX ) 40 MG tablet Take 1 tablet (40 mg total) by mouth daily. (Patient not taking: Reported on 02/24/2024) 90 tablet 3   sevelamer  carbonate (RENVELA ) 800 MG tablet Take 800 mg by mouth in the morning and at bedtime. (Patient not taking: Reported on 02/24/2024)  No facility-administered medications prior to visit.    ROS   Objective:  Physical Exam Constitutional:      Appearance: Normal appearance. He is obese.  HENT:     Head: Normocephalic and atraumatic.     Mouth/Throat:     Mouth: Mucous membranes are moist.     Pharynx: Oropharynx is clear. No oropharyngeal exudate or posterior oropharyngeal  erythema.  Eyes:     General: No scleral icterus.    Conjunctiva/sclera: Conjunctivae normal.  Cardiovascular:     Rate and Rhythm: Normal rate and regular rhythm.  Pulmonary:     Effort: Pulmonary effort is normal. No respiratory distress.     Breath sounds: Normal breath sounds. No stridor. No wheezing, rhonchi or rales.  Musculoskeletal:     Right lower leg: No edema.     Left lower leg: No edema.  Neurological:     Mental Status: He is alert and oriented to person, place, and time.  Psychiatric:        Mood and Affect: Mood normal.        Thought Content: Thought content normal.        Judgment: Judgment normal.      Vitals:   02/24/24 1017  BP: (!) 145/82  Pulse: 73  Temp: 98.6 F (37 C)  TempSrc: Oral  SpO2: 98%  Weight: 279 lb 6.4 oz (126.7 kg)  Height: 6' 2 (1.88 m)   98% On RA BMI Readings from Last 3 Encounters:  02/24/24 35.87 kg/m  02/01/24 35.31 kg/m  01/23/24 35.72 kg/m   Wt Readings from Last 3 Encounters:  02/24/24 279 lb 6.4 oz (126.7 kg)  02/01/24 275 lb (124.7 kg)  01/23/24 278 lb 3.5 oz (126.2 kg)     CBC    Component Value Date/Time   WBC 5.4 01/23/2024 1713   RBC 3.73 (L) 01/23/2024 1713   HGB 10.6 (L) 01/23/2024 1713   HCT 32.6 (L) 01/23/2024 1713   PLT 150 01/23/2024 1713   MCV 87.4 01/23/2024 1713   MCH 28.4 01/23/2024 1713   MCHC 32.5 01/23/2024 1713   RDW 14.2 01/23/2024 1713   LYMPHSABS 1.8 07/15/2023 0920   MONOABS 0.5 07/15/2023 0920   EOSABS 0.2 07/15/2023 0920   BASOSABS 0.0 07/15/2023 0920    TSH 1.64 (07/15/23)   Chest Imaging: N/A  Pulmonary Functions Testing Results:     No data to display          FeNO: N/A  Pathology: N/A  Echocardiogram:  TTE (09/27/2023): 1. Left ventricular ejection fraction, by estimation, is 55 to 60%. The  left ventricle has normal function. The left ventricle has no regional  wall motion abnormalities. There is mild left ventricular hypertrophy.  Left ventricular  diastolic parameters  are consistent with Grade I diastolic dysfunction (impaired relaxation).   2. Right ventricular systolic function is normal. The right ventricular  size is normal.   3. Left atrial size was moderately dilated.   4. The mitral valve is abnormal. Trivial mitral valve regurgitation. No  evidence of mitral stenosis.   5. The aortic valve is tricuspid. There is mild calcification of the  aortic valve. There is mild thickening of the aortic valve. Aortic valve  regurgitation is not visualized. Aortic valve sclerosis is present, with  no evidence of aortic valve stenosis.   6. The inferior vena cava is normal in size with greater than 50%  respiratory variability, suggesting right atrial pressure of 3 mmHg.   Heart Catheterization:  N/A  Sleep Studies: N/A    Assessment & Plan:     ICD-10-CM   1. Sleep disorder, unspecified  G47.9     2. 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  He will notify me once this report is available at which time I will review the result and prescribe CPAP if appropriate. He has used CPAP in the past and reports doing well with a FFM, so we will bypass the P-10 nasal pillows mask in this case.  Smoking/Tobacco Cessation Counseling FIDEL CAGGIANO is a current user of tobacco or nicotine  products.  # Cigarettes / day: <= 10 per day. Last quit without any smoking cessation aids. Doesn't want to use any such cessation aids (states these do not work).  He is ready to quit at this time. He has set a quit date of 03/23/2024. Counseling provided today addressed the risks of continued use and the benefits of cessation. Discussed tobacco/nicotine  use history, readiness to quit, and evidence-based treatment options including behavioral strategies, support resources, and pharmacologic therapies. Provided encouragement to quit smoking. Patient  questions were addressed, and follow-up recommended for continued support. Total time spent on counseling: 6 minutes.  Follow up: 3 months   Current Outpatient Medications:    acetaminophen  (TYLENOL ) 500 MG tablet, Take 1,000 mg by mouth daily as needed for moderate pain (pain score 4-6) or mild pain (pain score 1-3)., Disp: , Rfl:    albuterol  (VENTOLIN  HFA) 108 (90 Base) MCG/ACT inhaler, Inhale 2 puffs into the lungs every 6 (six) hours as needed for wheezing or shortness of breath., Disp: 18 g, Rfl: 1   amLODipine  (NORVASC ) 5 MG tablet, Take 1 tablet (5 mg total) by mouth daily., Disp: 90 tablet, Rfl: 0   b complex-vitamin c-folic acid  (NEPHRO-VITE) 0.8 MG TABS tablet, Take 1 tablet by mouth daily., Disp: , Rfl:    calcitRIOL  (ROCALTROL ) 0.5 MCG capsule, Take 8 capsules (4 mcg total) by mouth every Monday, Wednesday, and Friday with hemodialysis., Disp: , Rfl:    clopidogrel  (PLAVIX ) 75 MG tablet, Take 1 tablet (75 mg total) by mouth daily., Disp: 90 tablet, Rfl: 3   cyanocobalamin  (VITAMIN B12) 1000 MCG tablet, Take 1,000 mcg by mouth daily., Disp: , Rfl:    furosemide  (LASIX ) 80 MG tablet, Take 1 tablet (80 mg total) by mouth 2 (two) times daily., Disp: 60 tablet, Rfl: 5   gabapentin  (NEURONTIN ) 100 MG capsule, Take 100 mg by mouth at bedtime., Disp: , Rfl:    ibuprofen  (ADVIL ) 800 MG tablet, Take 1 tablet (800 mg total) by mouth daily as needed for moderate pain (pain score 4-6)., Disp: 30 tablet, Rfl: 2   isosorbide  mononitrate (IMDUR ) 60 MG 24 hr tablet, Take 1 tablet (60 mg total) by mouth daily., Disp: 30 tablet, Rfl: 0   levothyroxine  (SYNTHROID ) 125 MCG tablet, TAKE 1 TABLET(125 MCG) BY MOUTH EVERY MORNING, Disp: 90 tablet, Rfl: 3   Methoxy PEG-Epoetin Beta (MIRCERA IJ), 75 mcg., Disp: , Rfl:    nitroGLYCERIN  (NITROSTAT ) 0.4 MG SL tablet, DISSOLVE 1 TABLET UNDER THE TONGUE EVERY 5 MINUTES AS NEEDED FOR CHEST PAIN, Disp: 25 tablet, Rfl: 3   Tuberculin PPD (TUBERSOL ID), Inject 0.1 mLs  into the skin., Disp: , Rfl:    cinacalcet  (SENSIPAR ) 30 MG tablet, Take 3 tablets (90 mg total) by mouth daily with supper. (Patient not  taking: Reported on 02/24/2024), Disp: , Rfl:    latanoprost (XALATAN) 0.005 % ophthalmic solution, Place 1 drop into the right eye at bedtime. (Patient not taking: Reported on 02/24/2024), Disp: , Rfl:    Methoxy PEG-Epoetin Beta (MIRCERA IJ), 30 mcg. (Patient not taking: Reported on 02/24/2024), Disp: , Rfl:    pantoprazole  (PROTONIX ) 40 MG tablet, Take 1 tablet (40 mg total) by mouth daily. (Patient not taking: Reported on 02/24/2024), Disp: 90 tablet, Rfl: 3   sevelamer  carbonate (RENVELA ) 800 MG tablet, Take 800 mg by mouth in the morning and at bedtime. (Patient not taking: Reported on 02/24/2024), Disp: , Rfl:   MDM Level: Low  Lamar Dales, MD Pulmonary, Critical Care & Sleep Medicine [facility] 02/24/24 10:53 AM

## 2024-03-07 ENCOUNTER — Telehealth: Payer: Self-pay | Admitting: *Deleted

## 2024-03-07 NOTE — Telephone Encounter (Signed)
 Called patient and informed he would need to have [facility] send copy of sleep study in order for Dr. Olena to review for therapy. NFN Reason for CRM: Patient had a sleep study done on 03/03/24 at [facility].  He wanted to let Dr. Olena know so that he can get the results. He was told he needed a CPAP machine.  Please return his call at [phone].

## 2024-03-27 NOTE — Telephone Encounter (Signed)
 Copied from CRM #[id number]. Topic: Clinical - Lab/Test Results >> Mar 24, 2024  1:51 PM Isabell A wrote: Reason for CRM: Patient is inquiring about his sleep study results.   Callback number: [phone]   Dr. Olena can you please advise and view sleep study results dated 03/03/2024 through [facility]

## 2024-03-29 NOTE — Telephone Encounter (Signed)
 SABRA

## 2024-03-31 ENCOUNTER — Ambulatory Visit: Admitting: Gastroenterology

## 2024-04-01 ENCOUNTER — Emergency Department (HOSPITAL_COMMUNITY)
Admission: EM | Admit: 2024-04-01 | Discharge: 2024-04-01 | Disposition: A | Discharge status: Home / Self Care | Attending: Emergency Medicine | Admitting: Emergency Medicine

## 2024-04-01 ENCOUNTER — Encounter (HOSPITAL_COMMUNITY): Payer: Self-pay | Admitting: Emergency Medicine

## 2024-04-01 ENCOUNTER — Ambulatory Visit (HOSPITAL_COMMUNITY)
Admission: EM | Admit: 2024-04-01 | Discharge: 2024-04-01 | Disposition: A | Discharge status: Home / Self Care | Attending: Family Medicine | Admitting: Family Medicine

## 2024-04-01 ENCOUNTER — Emergency Department (HOSPITAL_COMMUNITY)

## 2024-04-01 ENCOUNTER — Encounter (HOSPITAL_COMMUNITY): Payer: Self-pay

## 2024-04-01 ENCOUNTER — Other Ambulatory Visit: Payer: Self-pay

## 2024-04-01 DIAGNOSIS — N186 End stage renal disease: Secondary | ICD-10-CM | POA: Diagnosis not present

## 2024-04-01 DIAGNOSIS — I132 Hypertensive heart and chronic kidney disease with heart failure and with stage 5 chronic kidney disease, or end stage renal disease: Secondary | ICD-10-CM | POA: Insufficient documentation

## 2024-04-01 DIAGNOSIS — Z7902 Long term (current) use of antithrombotics/antiplatelets: Secondary | ICD-10-CM | POA: Diagnosis not present

## 2024-04-01 DIAGNOSIS — Z992 Dependence on renal dialysis: Secondary | ICD-10-CM

## 2024-04-01 DIAGNOSIS — R600 Localized edema: Secondary | ICD-10-CM

## 2024-04-01 DIAGNOSIS — E871 Hypo-osmolality and hyponatremia: Secondary | ICD-10-CM | POA: Insufficient documentation

## 2024-04-01 DIAGNOSIS — I509 Heart failure, unspecified: Secondary | ICD-10-CM | POA: Diagnosis not present

## 2024-04-01 DIAGNOSIS — Z79899 Other long term (current) drug therapy: Secondary | ICD-10-CM | POA: Insufficient documentation

## 2024-04-01 DIAGNOSIS — I4891 Unspecified atrial fibrillation: Secondary | ICD-10-CM | POA: Diagnosis not present

## 2024-04-01 DIAGNOSIS — E878 Other disorders of electrolyte and fluid balance, not elsewhere classified: Secondary | ICD-10-CM | POA: Insufficient documentation

## 2024-04-01 DIAGNOSIS — M7989 Other specified soft tissue disorders: Secondary | ICD-10-CM | POA: Diagnosis present

## 2024-04-01 LAB — BASIC METABOLIC PANEL WITH GFR
Anion gap: 12 (ref 5–15)
BUN: 20 mg/dL (ref 8–23)
CO2: 30 mmol/L (ref 22–32)
Calcium: 10.1 mg/dL (ref 8.9–10.3)
Chloride: 91 mmol/L — ABNORMAL LOW (ref 98–111)
Creatinine, Ser: 6.71 mg/dL — ABNORMAL HIGH (ref 0.61–1.24)
GFR, Estimated: 9 mL/min — ABNORMAL LOW
Glucose, Bld: 101 mg/dL — ABNORMAL HIGH (ref 70–99)
Potassium: 3.5 mmol/L (ref 3.5–5.1)
Sodium: 133 mmol/L — ABNORMAL LOW (ref 135–145)

## 2024-04-01 LAB — CBC
HCT: 34 % — ABNORMAL LOW (ref 39.0–52.0)
Hemoglobin: 10.7 g/dL — ABNORMAL LOW (ref 13.0–17.0)
MCH: 29 pg (ref 26.0–34.0)
MCHC: 31.5 g/dL (ref 30.0–36.0)
MCV: 92.1 fL (ref 80.0–100.0)
Platelets: 151 K/uL (ref 150–400)
RBC: 3.69 MIL/uL — ABNORMAL LOW (ref 4.22–5.81)
RDW: 15.6 % — ABNORMAL HIGH (ref 11.5–15.5)
WBC: 4.5 K/uL (ref 4.0–10.5)
nRBC: 0 % (ref 0.0–0.2)

## 2024-04-01 MED ORDER — FUROSEMIDE 10 MG/ML IJ SOLN
60.0000 mg | INTRAMUSCULAR | Status: AC
  Administered 2024-04-01: 60 mg via INTRAVENOUS
  Filled 2024-04-01: qty 6

## 2024-04-01 NOTE — ED Provider Notes (Signed)
 " [facility]    CSN: [id number] Arrival date & time: 04/01/24  0809      History   Chief Complaint Chief Complaint  Patient presents with   Leg Swelling    HPI Craig Flynn is a 64 y.o. male.   HPI  Here for swelling of both feet x 3-4 days. He states he is always short of breath. Is on dialysis MWF, last dialysis yesterday.  He states that the dialysis center is swelling and that they did blood work yesterday.  He is already taking Lasix  80 mg twice daily and reports compliance with this.  He does not note any overly salty intake in the last few days.  He did sustain an abrasion to his shin yesterday.  No fever or cough.  No chest pain Past Medical History:  Diagnosis Date   Acute pancreatitis 08/22/2020   Acute systolic CHF (congestive heart failure) (HCC) 11/17/2013   Anemia 06/2015   Anemia due to acquired thiamine deficiency 03/25/2020   Arthritis    knee   Bacterial sinusitis 10/16/2019   Cancer (HCC)    thyroid  cancer   Cardiomyopathy (HCC)    a. 10/2013: EF reduced to 35-40% b. 09/2014: EF improved to 55-60%, Grade 1 DD noted.   Chest pain 06/2015   CHF (congestive heart failure) (HCC)    CKD (chronic kidney disease), stage IV [facility])    sees  Dr Betsey   Dyspnea    with exerion    GERD (gastroesophageal reflux disease)    Glaucoma    Gout    Hypertension    Obesity    Pancreatitis    Sleep apnea    not wearing c-pap now, needs new slep study per pt. (01/22/2017)   Tuberculosis 1981   while the pt was in the service.   Tubular adenoma of colon 10/2015    Patient Active Problem List   Diagnosis Date Noted   Hypersomnolence 02/01/2024   Chest pain, rule out acute myocardial infarction 09/25/2023   Nasal congestion 09/02/2023   Wheezing 09/02/2023   Diarrhea, unspecified 07/27/2023   Fluid overload, unspecified 07/05/2023   Other disorders of phosphorus metabolism 04/01/2023   Paresthesia of foot, bilateral 05/06/2022   Cervical  radiculitis 02/25/2022   Chronic toe pain, bilateral 02/25/2022   Vertigo 11/08/2021   Dizziness 09/20/2021   Tinnitus of right ear 09/20/2021   Neuropathy 09/20/2021   Postural lightheadedness 06/19/2021   Pulsatile tinnitus of right ear 05/25/2021   Retinal artery thrombosis, 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   Mild protein-calorie malnutrition 10/07/2020   Allergy, unspecified, initial encounter 08/28/2020   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/28/2020   Complication of vascular dialysis catheter 08/28/2020   Other specified coagulation defects 08/28/2020   End stage renal disease (HCC) 08/28/2020   B12 deficiency 06/08/2020   Gynecomastia, male 06/07/2020   Erectile dysfunction 06/07/2020   Hand cramp 06/07/2020   Aortic atherosclerosis 06/07/2020   Umbilical hernia without obstruction and without gangrene 03/18/2020   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   Deficiency anemia 03/18/2020   Hyperlipidemia LDL goal <130 03/18/2020   Upper back pain on left side 02/06/2020   History of TB (tuberculosis) 10/30/2019   Allergic rhinitis 10/16/2019   CAD (coronary artery disease) 09/07/2019   Nephrosclerosis 09/07/2019   Obesity (BMI 30-39.9) 09/07/2019   Secondary hyperparathyroidism 09/07/2019   Obesity, diabetes, 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/22/2019   Ulnar neuritis, right 04/25/2019   Muscle cramps 02/26/2019   Abnormal TSH 02/26/2019  Vitamin D  deficiency 02/22/2019   Iron  deficiency 02/22/2019   Laryngopharyngeal reflux (LPR) 10/04/2018   Whiplash injuries, initial encounter 09/21/2018   Degenerative arthritis of right knee 09/21/2018   Chronic diastolic HF (heart failure) (HCC) 07/04/2018   Intractable post-traumatic headache 06/16/2018   Low back pain 06/15/2018   Neck pain 06/15/2018   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   Thyroid  nodule 01/22/2017   Ptosis 01/09/2016   GERD (gastroesophageal reflux disease) 07/29/2015   Osteoarthritis 05/03/2015   Tobacco abuse  02/26/2015   OSA (obstructive sleep apnea) 12/27/2014   Encounter for well adult exam with abnormal findings 11/12/2014   Anemia in chronic kidney disease 11/12/2014   Left knee pain 09/22/2014   Morbid obesity (HCC) 11/17/2013   HTN (hypertension) 12/11/2011   SOB (shortness of breath) 12/11/2011   Gout 12/31/2006   HERNIATED LUMBAR DISK WITH RADICULOPATHY 12/30/2006    Past Surgical History:  Procedure Laterality Date   AV FISTULA PLACEMENT Left 03/25/2016   Procedure: Creation of Left Arm ARTERIOVENOUS (AV) FISTULA;  Surgeon: Carlin FORBES Haddock, MD;  Location: [facility] OR;  Service: Vascular;  Laterality: Left;   AV FISTULA PLACEMENT Left 08/27/2020   Procedure: LEFT BASILIC VEIN ARTERIOVENOUS (AV) FISTULA CREATION;  Surgeon: Oris Krystal FALCON, MD;  Location: [facility] OR;  Service: Vascular;  Laterality: Left;   BASCILIC VEIN TRANSPOSITION Left 10/22/2020   Procedure: LEFT SECOND STAGE BASILIC VEIN TRANSPOSITION;  Surgeon: Haddock Carlin FORBES, MD;  Location: [facility] OR;  Service: Vascular;  Laterality: Left;   COLONOSCOPY W/ BIOPSIES AND POLYPECTOMY     no problem   CORONARY BALLOON ANGIOPLASTY N/A 09/27/2023   Procedure: CORONARY BALLOON ANGIOPLASTY;  Surgeon: Ladona Heinz, MD;  Location: [facility];  Service: Cardiovascular;  Laterality: N/A;   HERNIA REPAIR     IR FLUORO GUIDE CV LINE RIGHT  08/22/2020   IR US  GUIDE VASC ACCESS RIGHT  08/22/2020   LAPAROSCOPIC CHOLECYSTECTOMY     LEFT HEART CATH AND CORONARY ANGIOGRAPHY N/A 09/27/2023   Procedure: LEFT HEART CATH AND CORONARY ANGIOGRAPHY;  Surgeon: Ladona Heinz, MD;  Location: [facility];  Service: Cardiovascular;  Laterality: N/A;   LIGATION OF COMPETING BRANCHES OF ARTERIOVENOUS FISTULA Left 01/25/2018   Procedure: LIGATION OF COMPETING BRANCHES And Revision of ARTERIOVENOUS FISTULA LEFT ARM.;  Surgeon: Haddock Carlin FORBES, MD;  Location: [facility] OR;  Service: Vascular;  Laterality: Left;   THYROIDECTOMY  01/22/2017   THYROIDECTOMY Left 01/22/2017    Procedure: THYROIDECTOMY;  Surgeon: Carlie Clark, MD;  Location: [facility] OR;  Service: ENT;  Laterality: Left;   THYROIDECTOMY Right 03/26/2017   Procedure: COMPLETION OF THYROIDECTOMY;  Surgeon: Carlie Clark, MD;  Location: [facility] OR;  Service: ENT;  Laterality: Right;   UMBILICAL HERNIA REPAIR     WISDOM TOOTH EXTRACTION         Home Medications    Prior to Admission medications  Medication Sig Start Date End Date Taking? Authorizing Provider  acetaminophen  (TYLENOL ) 500 MG tablet Take 1,000 mg by mouth daily as needed for moderate pain (pain score 4-6) or mild pain (pain score 1-3).    [provider]  albuterol  (VENTOLIN  HFA) 108 (90 Base) MCG/ACT inhaler Inhale 2 puffs into the lungs every 6 (six) hours as needed for wheezing or shortness of breath. 09/02/23   Alvia Corean CROME, FNP  amLODipine  (NORVASC ) 5 MG tablet Take 1 tablet (5 mg total) by mouth daily. 09/28/23   Caleen Burgess BROCKS, MD  b complex-vitamin c-folic acid  (NEPHRO-VITE)  0.8 MG TABS tablet Take 1 tablet by mouth daily.    [provider]  calcitRIOL  (ROCALTROL ) 0.5 MCG capsule Take 8 capsules (4 mcg total) by mouth every Monday, Wednesday, and Friday with hemodialysis. 09/29/23   Amin, Ankit C, MD  cinacalcet  (SENSIPAR ) 30 MG tablet Take 3 tablets (90 mg total) by mouth daily with supper. Patient not taking: Reported on 02/24/2024 09/28/23   Caleen Burgess BROCKS, MD  clopidogrel  (PLAVIX ) 75 MG tablet Take 1 tablet (75 mg total) by mouth daily. 09/28/23 09/27/24  Amin, Ankit C, MD  cyanocobalamin  (VITAMIN B12) 1000 MCG tablet Take 1,000 mcg by mouth daily.    [provider]  furosemide  (LASIX ) 80 MG tablet Take 1 tablet (80 mg total) by mouth 2 (two) times daily. 02/01/24   Norleen Lynwood ORN, MD  gabapentin  (NEURONTIN ) 100 MG capsule Take 100 mg by mouth at bedtime. 09/17/23   [provider]  isosorbide  mononitrate (IMDUR ) 60 MG 24 hr tablet Take 1 tablet (60 mg total) by mouth daily. 09/28/23   Amin, Ankit C, MD   latanoprost (XALATAN) 0.005 % ophthalmic solution Place 1 drop into the right eye at bedtime. Patient not taking: Reported on 02/24/2024    [provider]  levothyroxine  (SYNTHROID ) 125 MCG tablet TAKE 1 TABLET(125 MCG) BY MOUTH EVERY MORNING 05/06/23   Norleen Lynwood ORN, MD  Methoxy PEG-Epoetin Beta (MIRCERA IJ) 75 mcg. 11/22/23 11/20/24  [provider]  Methoxy PEG-Epoetin Beta (MIRCERA IJ) 30 mcg. Patient not taking: Reported on 02/24/2024 01/26/24 01/24/25  [provider]  nitroGLYCERIN  (NITROSTAT ) 0.4 MG SL tablet DISSOLVE 1 TABLET UNDER THE TONGUE EVERY 5 MINUTES AS NEEDED FOR CHEST PAIN 02/01/24   Norleen Lynwood ORN, MD  pantoprazole  (PROTONIX ) 40 MG tablet Take 1 tablet (40 mg total) by mouth daily. Patient not taking: Reported on 02/24/2024 10/21/21   Norleen Lynwood ORN, MD  sevelamer  carbonate (RENVELA ) 800 MG tablet Take 800 mg by mouth in the morning and at bedtime. Patient not taking: Reported on 02/24/2024    [provider]  Tuberculin PPD (TUBERSOL ID) Inject 0.1 mLs into the skin. 12/13/23   [provider]    Family History Family History  Problem Relation Age of Onset   Diabetes Mother    Stroke Mother    Pneumonia Father        died of Pneumonia x3   Kidney disease Maternal Grandmother    Hypertension Maternal Grandmother    Healthy Maternal Grandfather    Healthy Paternal Grandmother    Healthy Paternal Grandfather    CAD Neg Hx    Colon cancer Neg Hx    Esophageal cancer Neg Hx    Pancreatic cancer Neg Hx    Prostate cancer Neg Hx    Stomach cancer Neg Hx    Rectal cancer Neg Hx     Social History Social History[1]   Allergies   Patient has no known allergies.   Review of Systems Review of Systems   Physical Exam Triage Vital Signs ED Triage Vitals  Encounter Vitals Group     BP 04/01/24 0825 (!) 154/64     Girls Systolic BP Percentile --      Girls Diastolic BP Percentile --      Boys Systolic BP Percentile --       Boys Diastolic BP Percentile --      Pulse Rate 04/01/24 0825 72     Resp 04/01/24 0825 20     Temp 04/01/24  0825 98.2 F (36.8 C)     Temp Source 04/01/24 0825 Oral     SpO2 04/01/24 0825 100 %     Weight 04/01/24 0828 275 lb 6.4 oz (124.9 kg)     Height --      Head Circumference --      Peak Flow --      Pain Score 04/01/24 0822 10     Pain Loc --      Pain Education --      Exclude from Growth Chart --    No data found.  Updated Vital Signs BP (!) 154/64 (BP Location: Right Arm) Comment (BP Location): large cuff  Pulse 72   Temp 98.2 F (36.8 C) (Oral)   Resp 20   Wt 124.9 kg   SpO2 100%   BMI 35.36 kg/m   Visual Acuity Right Eye Distance:   Left Eye Distance:   Bilateral Distance:    Right Eye Near:   Left Eye Near:    Bilateral Near:     Physical Exam Vitals reviewed.  Constitutional:      General: He is not in acute distress.    Appearance: He is not ill-appearing, toxic-appearing or diaphoretic.  Cardiovascular:     Rate and Rhythm: Normal rate and regular rhythm.     Heart sounds: No murmur heard. Pulmonary:     Effort: Pulmonary effort is normal. No respiratory distress.     Breath sounds: No stridor. No wheezing, rhonchi or rales.  Musculoskeletal:     Comments: There is 2+ edema of the feet and ankles.  No erythema that I can tell.  There is a small abrasion about 1 cm in diameter on the proximal right shin.  No drainage and it is not bleeding.  No erythema or induration around it.  Skin:    Coloration: Skin is not jaundiced or pale.  Neurological:     Mental Status: He is alert and oriented to person, place, and time.  Psychiatric:        Behavior: Behavior normal.      UC Treatments / Results  Labs (all labs ordered are listed, but only abnormal results are displayed) Labs Reviewed - No data to display  EKG   Radiology No results found.  Procedures Procedures (including critical care time)  Medications Ordered in UC Medications  - No data to display  Initial Impression / Assessment and Plan / UC Course  I have reviewed the triage vital signs and the nursing notes.  Pertinent labs & imaging results that were available during my care of the patient were reviewed by me and considered in my medical decision making (see chart for details).     We do not have IV furosemide  here in this urgent care clinic.  We also have difficulty getting stat labs accomplished in this clinic.  I have asked the patient to please proceed to the emergency room where they can accomplish that lab evaluation for him if they feel it he needs this and they can possibly provide him with IV medications to relieve his symptoms.  Of note, in November he was assessed there for an exacerbation of his heart failure and did get benefit from such evaluation and treatment then.  He is agreeable and will go by private vehicle Final Clinical Impressions(s) / UC Diagnoses   Final diagnoses:  Peripheral edema  Congestive heart failure, unspecified HF chronicity, unspecified heart failure type (HCC)  Chronic kidney disease on chronic dialysis (  [facility])     Discharge Instructions      He will go to the ER for further evaluation and treatment     ED Prescriptions   None    PDMP not reviewed this encounter.    [1]  Social History Tobacco Use   Smoking status: Every Day    Current packs/day: 0.00    Types: Cigarettes    Start date: 03/16/1982    Last attempt to quit: 03/16/2022    Years since quitting: 2.0   Smokeless tobacco: Never   Tobacco comments:    1/2 pack per day (03/25/2021)    Patient stated he quit smoking on 03/16/2022  Vaping Use   Vaping status: Never Used  Substance Use Topics   Alcohol use: Not Currently    Alcohol/week: 0.0 standard drinks of alcohol   Drug use: No     Vonna Sharlet POUR, MD 04/01/24 0848  "

## 2024-04-01 NOTE — Discharge Instructions (Signed)
 He will go to the ER for further evaluation and treatment

## 2024-04-01 NOTE — ED Notes (Signed)
 Patient is being discharged from the Urgent Care and sent to the Emergency Department via pov . Per Dr Vonna, patient is in need of higher level of care due to limited resources. Patient is aware and verbalizes understanding of plan of care.  Vitals:   04/01/24 0825  BP: (!) 154/64  Pulse: 72  Resp: 20  Temp: 98.2 F (36.8 C)  SpO2: 100%

## 2024-04-01 NOTE — ED Notes (Signed)
 Patient does have a slightly darker area to right heel.  Also, open wound to right anterior lower leg

## 2024-04-01 NOTE — ED Triage Notes (Addendum)
 Patient reports 3 days ago noticed swelling to bilateral lower extremities.  Pitting edema bilateral lower legs.  Reports being 2 kgs over baseline weight, 4 kgs over dry weight on  Wednesday.  Patient is a dialysis patient. Last dialysis was friday   Patient denies any pain, patient denies any medicine changes, patient has not run out of medicines.    Pain in right foot with weight bearing  Dry weight 122.4 kg

## 2024-04-01 NOTE — Discharge Instructions (Signed)
 Follow-up with your primary care provider, cardiologist, and A-fib clinic.  Seek emergency care if experiencing any new or worsening symptoms.

## 2024-04-01 NOTE — ED Provider Notes (Signed)
 "  EMERGENCY DEPARTMENT AT [facility] Provider Note   CSN: [id number] Arrival date & time: 04/01/24  9156     Patient presents with: Leg Swelling   Craig Flynn is a 64 y.o. male with PMHx CHF, ESRD on dialysis MWF, GERD, HTN who presents to ED concern for BL LE swelling over the past 3-4 days.  Patient is compliant with home medications and dialysis.  Last session of dialysis was on Friday and patient stating that they drained extra fluid off of him to help prevent fluid overload over the weekend.   Endorses chronic SOB which has not worsened recently. Denies chest pain, fever, cough, vomiting, diarrhea, dysuria, hematuria.  UC referring patient to ED for IV Lasix  and further evaluation.  Patient stating that he makes a good amount of urine daily - was able to void a good amount of urine in the past 24 hours.   HPI     Prior to Admission medications  Medication Sig Start Date End Date Taking? Authorizing Provider  acetaminophen  (TYLENOL ) 500 MG tablet Take 1,000 mg by mouth daily as needed for moderate pain (pain score 4-6) or mild pain (pain score 1-3).    [provider]  albuterol  (VENTOLIN  HFA) 108 (90 Base) MCG/ACT inhaler Inhale 2 puffs into the lungs every 6 (six) hours as needed for wheezing or shortness of breath. 09/02/23   Alvia Corean CROME, FNP  amLODipine  (NORVASC ) 5 MG tablet Take 1 tablet (5 mg total) by mouth daily. 09/28/23   Amin, Ankit C, MD  b complex-vitamin c-folic acid  (NEPHRO-VITE) 0.8 MG TABS tablet Take 1 tablet by mouth daily.    [provider]  calcitRIOL  (ROCALTROL ) 0.5 MCG capsule Take 8 capsules (4 mcg total) by mouth every Monday, Wednesday, and Friday with hemodialysis. 09/29/23   Amin, Ankit C, MD  cinacalcet  (SENSIPAR ) 30 MG tablet Take 3 tablets (90 mg total) by mouth daily with supper. Patient not taking: Reported on 02/24/2024 09/28/23   Caleen Burgess BROCKS, MD  clopidogrel  (PLAVIX ) 75 MG tablet Take 1 tablet (75 mg  total) by mouth daily. 09/28/23 09/27/24  Amin, Burgess BROCKS, MD  cyanocobalamin  (VITAMIN B12) 1000 MCG tablet Take 1,000 mcg by mouth daily.    [provider]  furosemide  (LASIX ) 80 MG tablet Take 1 tablet (80 mg total) by mouth 2 (two) times daily. 02/01/24   Norleen Agent ORN, MD  gabapentin  (NEURONTIN ) 100 MG capsule Take 100 mg by mouth at bedtime. 09/17/23   [provider]  isosorbide  mononitrate (IMDUR ) 60 MG 24 hr tablet Take 1 tablet (60 mg total) by mouth daily. 09/28/23   Amin, Ankit C, MD  latanoprost (XALATAN) 0.005 % ophthalmic solution Place 1 drop into the right eye at bedtime. Patient not taking: Reported on 02/24/2024    [provider]  levothyroxine  (SYNTHROID ) 125 MCG tablet TAKE 1 TABLET(125 MCG) BY MOUTH EVERY MORNING 05/06/23   Norleen Agent ORN, MD  Methoxy PEG-Epoetin Beta (MIRCERA IJ) 75 mcg. 11/22/23 11/20/24  [provider]  Methoxy PEG-Epoetin Beta (MIRCERA IJ) 30 mcg. Patient not taking: Reported on 02/24/2024 01/26/24 01/24/25  [provider]  nitroGLYCERIN  (NITROSTAT ) 0.4 MG SL tablet DISSOLVE 1 TABLET UNDER THE TONGUE EVERY 5 MINUTES AS NEEDED FOR CHEST PAIN 02/01/24   Norleen Agent ORN, MD  pantoprazole  (PROTONIX ) 40 MG tablet Take 1 tablet (40 mg total) by mouth daily. Patient not taking: Reported on 02/24/2024 10/21/21   Norleen Agent ORN, MD  sevelamer  carbonate (  RENVELA ) 800 MG tablet Take 800 mg by mouth in the morning and at bedtime. Patient not taking: Reported on 02/24/2024    [provider]  Tuberculin PPD (TUBERSOL ID) Inject 0.1 mLs into the skin. 12/13/23   [provider]    Allergies: Patient has no known allergies.    Review of Systems  Cardiovascular:  Positive for leg swelling.    Updated Vital Signs BP (!) 182/83   Pulse 92   Temp (!) 97.2 F (36.2 C) (Oral)   Resp 18   Ht 6' 2 (1.88 m)   Wt 124.7 kg   SpO2 100%   BMI 35.31 kg/m   Physical Exam Vitals and nursing note reviewed.  Constitutional:       General: He is not in acute distress.    Appearance: He is not ill-appearing or toxic-appearing.  HENT:     Head: Normocephalic and atraumatic.     Mouth/Throat:     Mouth: Mucous membranes are moist.  Eyes:     General: No scleral icterus.       Right eye: No discharge.        Left eye: No discharge.     Conjunctiva/sclera: Conjunctivae normal.  Cardiovascular:     Rate and Rhythm: Normal rate and regular rhythm.     Pulses: Normal pulses.     Heart sounds: Normal heart sounds. No murmur heard. Pulmonary:     Effort: Pulmonary effort is normal. No respiratory distress.     Breath sounds: Normal breath sounds. No wheezing, rhonchi or rales.  Abdominal:     General: Abdomen is flat. Bowel sounds are normal.  Musculoskeletal:     Right lower leg: Edema present.     Left lower leg: Edema present.     Comments: +2 pitting edema BL.  No erythema or increased warmth of BL LE.  Skin:    General: Skin is warm and dry.     Findings: No rash.  Neurological:     General: No focal deficit present.     Mental Status: He is alert and oriented to person, place, and time. Mental status is at baseline.  Psychiatric:        Mood and Affect: Mood normal.        Behavior: Behavior normal.     (all labs ordered are listed, but only abnormal results are displayed) Labs Reviewed  BASIC METABOLIC PANEL WITH GFR - Abnormal; Notable for the following components:      Result Value   Sodium 133 (*)    Chloride 91 (*)    Glucose, Bld 101 (*)    Creatinine, Ser 6.71 (*)    GFR, Estimated 9 (*)    All other components within normal limits  CBC - Abnormal; Notable for the following components:   RBC 3.69 (*)    Hemoglobin 10.7 (*)    HCT 34.0 (*)    RDW 15.6 (*)    All other components within normal limits    EKG: EKG Interpretation Date/Time:  Saturday April 01 2024 08:55:05 EST Ventricular Rate:  85 PR Interval:    QRS Duration:  104 QT Interval:  416 QTC Calculation: 495 R  Axis:   47  Text Interpretation: Undetermined rhythm Nonspecific ST and T wave abnormality Prolonged QT When compared with ECG of 23-Jan-2024 17:19, PREVIOUS ECG IS PRESENT Confirmed by Doretha Folks ([id number]) on 04/01/2024 12:15:25 PM  Radiology: [facility] Chest 2 View Result Date: 04/01/2024 EXAM: 2 VIEW(S) XRAY  OF THE CHEST 04/01/2024 09:28:00 AM COMPARISON: 01/23/2024 CLINICAL HISTORY: 64 year old male with leg swelling, dialysis patient, and some shortness of breath. FINDINGS: LINES, TUBES AND DEVICES: Stable upper abdominal surgical clips. LUNGS AND PLEURA: Mild diffuse pulmonary interstitial prominence, improved from prior and no overt edema . Improved lung base ventilation since November. No acute lung finding. No pleural effusion. No pneumothorax. HEART AND MEDIASTINUM: Aortic atherosclerosis. No acute abnormality of the cardiac and mediastinal silhouettes. BONES AND SOFT TISSUES: No acute osseous abnormality. IMPRESSION: 1. No acute cardiopulmonary abnormality. Electronically signed by: Helayne Hurst MD MD 04/01/2024 09:35 AM EST RP Workstation: [id number]     Procedures   Medications Ordered in the ED  furosemide  (LASIX ) injection 60 mg (60 mg Intravenous Given 04/01/24 1108)                                    Medical Decision Making Amount and/or Complexity of Data Reviewed Labs: ordered. Radiology: ordered.  Risk Prescription drug management.   This patient presents to the ED for concern of leg swelling, this involves an extensive number of treatment options, and is a complaint that carries with it a high risk of complications and morbidity.  The differential diagnosis includes AKI, CHF, cellulitis, PVD, etc.   Co morbidities that complicate the patient evaluation  CHF, ESRD on dialysis MWF, GERD, HTN    Additional history obtained:  09/2023 Heart Cath: Mid Cx lesion is 30% stenosed.  09/2023 ECHO: 55-60% EF    Problem List / ED Course / Critical interventions /  Medication management  Patient presents to ED concerned for BL LE swelling x3-4 days. Endorses compliance on home medications and dialysis sessions. Last session of dialysis was yesterday. Physical exam with +2 pitting edema. Rest of physical exam reassuring - no other signs of fluid overload. Patient afebrile with stable vitals.  I Ordered, and personally interpreted labs.  MP with improving creatinine at 6.71 today.  There is mild hyponatremia at 133 and chloride is low at 91.  CBC without leukocytosis.  There is mild and stable anemia with hemoglobin at 10.7 today. I ordered imaging studies including chest x-ray. I independently visualized and interpreted imaging which showed no acute cardiopulmonary disease. I agree with the radiologist interpretation The patient was maintained on a cardiac monitor.  I personally viewed and interpreted the cardiac monitored which showed an underlying rhythm of: Afib Afib new for patient. Patient continues to deny chest pain or SOB or other acute symptoms - just concerned for LE swelling. Patient given referral for Afib clinic. Patient already on Plavix  and has complex medication list that will need to be evaluated by afib clinic or his cardiologist. Patient agreeable with plan and is ready to go home. Also educated patient on compression stockings and maintaining a low salt and fluid restricted diet.  Staffed with Dr. Doretha who is agreeable with plan.  I have reviewed the patients home medicines and have made adjustments as needed The patient has been appropriately medically screened and/or stabilized in the ED. I have low suspicion for any other emergent medical condition which would require further screening, evaluation or treatment in the ED or require inpatient management. At time of discharge the patient is hemodynamically stable and in no acute distress. I have discussed work-up results and diagnosis with patient and answered all questions. Patient is agreeable  with discharge plan. We discussed strict return precautions for returning to  the emergency department and they verbalized understanding.     Social Determinants of Health:  none      Final diagnoses:  Atrial fibrillation, unspecified type [facility])  Leg swelling    ED Discharge Orders          Ordered    Amb Referral to AFIB Clinic        04/01/24 1243               Craig Flynn 04/01/24 1248    Doretha Folks, MD 04/01/24 1331  "

## 2024-04-01 NOTE — ED Triage Notes (Signed)
 Pt c.o bilateral leg swelling x 3 days. Pt denies SOB more than normal. Pt also c.o pain in his right heel. Pt is a dialysis pt, MWF, last dialysis was Friday.

## 2024-04-04 ENCOUNTER — Telehealth: Payer: Self-pay | Admitting: Pulmonary Disease

## 2024-04-04 DIAGNOSIS — G4733 Obstructive sleep apnea (adult) (pediatric): Secondary | ICD-10-CM

## 2024-04-04 NOTE — Telephone Encounter (Signed)
 Please call pt with sleep study results.

## 2024-04-05 NOTE — Telephone Encounter (Signed)
 Contacted patient he is ok with orders being placed for new cpap. NFN   Stretch, Lamar PARAS, MD to [facility], CMA     04/02/24  6:00 PM On the basis of this patient's sleep study performed at [facility], I recommend the following:   AutoCPAP 11-13 cm H2O with a Resmed F-20 full face mask (size: large).   He already has follow up with me scheduled for March.   Thanks!

## 2024-04-07 NOTE — Telephone Encounter (Signed)
 See separate encounter. NFN

## 2024-04-11 ENCOUNTER — Ambulatory Visit (HOSPITAL_COMMUNITY): Admitting: Internal Medicine

## 2024-04-12 ENCOUNTER — Encounter (HOSPITAL_COMMUNITY): Payer: Self-pay

## 2024-04-12 ENCOUNTER — Ambulatory Visit (HOSPITAL_COMMUNITY): Admitting: Physician Assistant

## 2024-04-12 NOTE — Progress Notes (Incomplete)
 "   Primary Care Physician: Norleen Lynwood ORN, MD Primary Cardiologist: Lynwood Schilling, MD Electrophysiologist: None  Referring Physician: ED   Craig Flynn is a 64 y.o. male with a history of HTN, ESRD, HFrecEF, CAD, OSA, atrial fibrillation who presents for follow up in the [facility].  The patient was initially diagnosed with atrial fibrillation 04/01/24 after presenting to urgent care with symptoms of bilateral lower extremity edema. He was sent to the ED for evaluation. ECG at the ED showed rate controlled afib. He was not started on anticoagulation at that time.    Patient presents today for follow up for atrial fibrillation. ***  Today, he denies symptoms of ***palpitations, chest pain, shortness of breath, orthopnea, PND, lower extremity edema, dizziness, presyncope, syncope, bleeding, or neurologic sequela. The patient is tolerating medications without difficulties and is otherwise without complaint today.    Atrial Fibrillation Risk Factors:  he does have symptoms or diagnosis of sleep apnea. he {Action; does/does not:19097} have a history of rheumatic fever. he {Action; does/does not:19097} have a history of alcohol use. The patient {Action; does/does not:19097} have a history of early familial atrial fibrillation or other arrhythmias.  Atrial Fibrillation Management history:  Previous antiarrhythmic drugs: none Previous cardioversions: none Previous ablations: none Anticoagulation history: none  ROS- All systems are reviewed and negative except as per the HPI above.  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 10/2013: EF reduced to 35-40% b. 09/2014: EF improved  to 55-60%, Grade 1 DD noted.   Chest pain 06/2015   CHF (congestive heart failure) (HCC)    CKD (chronic kidney disease), stage IV [facility])    sees  Dr Betsey   Dyspnea    with exerion    GERD (gastroesophageal reflux disease)    Glaucoma    Gout    Hypertension    Obesity    Pancreatitis    Sleep apnea    not wearing c-pap now, needs new slep study per pt. (01/22/2017)   Tuberculosis 1981   while the pt was in the service.   Tubular adenoma of colon 10/2015    Current Outpatient Medications  Medication Sig Dispense Refill   acetaminophen  (TYLENOL ) 500 MG tablet Take 1,000 mg by mouth daily as needed for moderate pain (pain score 4-6) or mild pain (pain score 1-3).     albuterol  (VENTOLIN  HFA) 108 (90 Base) MCG/ACT inhaler Inhale 2 puffs into the lungs every 6 (six) hours as needed for wheezing or shortness of breath. 18 g 1   amLODipine  (NORVASC ) 5 MG tablet Take 1 tablet (5 mg total) by mouth daily. 90 tablet 0   b complex-vitamin c-folic acid  (NEPHRO-VITE) 0.8 MG TABS tablet Take 1 tablet by mouth daily.     calcitRIOL  (ROCALTROL ) 0.5 MCG capsule Take 8 capsules (4 mcg total) by mouth every Monday, Wednesday, and Friday with hemodialysis.     cinacalcet  (SENSIPAR ) 30 MG tablet Take 3 tablets (90 mg total) by mouth daily with supper. (Patient not taking: Reported on 02/24/2024)     clopidogrel  (PLAVIX ) 75 MG tablet Take 1 tablet (75 mg total) by mouth daily. 90 tablet 3  cyanocobalamin  (VITAMIN B12) 1000 MCG tablet Take 1,000 mcg by mouth daily.     furosemide  (LASIX ) 80 MG tablet Take 1 tablet (80 mg total) by mouth 2 (two) times daily. 60 tablet 5   gabapentin  (NEURONTIN ) 100 MG capsule Take 100 mg by mouth at bedtime.     isosorbide  mononitrate (IMDUR ) 60 MG 24 hr tablet Take 1 tablet (60 mg total) by mouth daily. 30 tablet 0   latanoprost (XALATAN) 0.005 % ophthalmic solution Place 1 drop into the right eye at bedtime. (Patient not taking: Reported on 02/24/2024)     levothyroxine   (SYNTHROID ) 125 MCG tablet TAKE 1 TABLET(125 MCG) BY MOUTH EVERY MORNING 90 tablet 3   Methoxy PEG-Epoetin Beta (MIRCERA IJ) 75 mcg.     Methoxy PEG-Epoetin Beta (MIRCERA IJ) 30 mcg. (Patient not taking: Reported on 02/24/2024)     nitroGLYCERIN  (NITROSTAT ) 0.4 MG SL tablet DISSOLVE 1 TABLET UNDER THE TONGUE EVERY 5 MINUTES AS NEEDED FOR CHEST PAIN 25 tablet 3   pantoprazole  (PROTONIX ) 40 MG tablet Take 1 tablet (40 mg total) by mouth daily. (Patient not taking: Reported on 02/24/2024) 90 tablet 3   sevelamer  carbonate (RENVELA ) 800 MG tablet Take 800 mg by mouth in the morning and at bedtime. (Patient not taking: Reported on 02/24/2024)     Tuberculin PPD (TUBERSOL ID) Inject 0.1 mLs into the skin.     No current facility-administered medications for this visit.    Physical Exam: There were no vitals taken for this visit.  GEN: Well nourished, well developed in no acute distress NECK: No JVD; No carotid bruits*** CARDIAC: {EPRHYTHM:28826}, no murmurs, rubs, gallops RESPIRATORY:  Clear to auscultation without rales, wheezing or rhonchi  ABDOMEN: Soft, non-tender, non-distended EXTREMITIES:  No edema; No deformity   Wt Readings from Last 3 Encounters:  04/01/24 124.7 kg  04/01/24 124.9 kg  02/24/24 126.7 kg        ***  Echo 09/27/23 demonstrated   1. Left ventricular ejection fraction, by estimation, is 55 to 60%. The  left ventricle has normal function. The left ventricle has no regional  wall motion abnormalities. There is mild left ventricular hypertrophy.  Left ventricular diastolic parameters are consistent with Grade I diastolic dysfunction (impaired relaxation).   2. Right ventricular systolic function is normal. The right ventricular  size is normal.   3. Left atrial size was moderately dilated.   4. The mitral valve is abnormal. Trivial mitral valve regurgitation. No  evidence of mitral stenosis.   5. The aortic valve is tricuspid. There is mild calcification of the   aortic valve. There is mild thickening of the aortic valve. Aortic valve  regurgitation is not visualized. Aortic valve sclerosis is present, with  no evidence of aortic valve stenosis.   6. The inferior vena cava is normal in size with greater than 50%  respiratory variability, suggesting right atrial pressure of 3 mmHg.    CHA2DS2-VASc Score =    The patient's score is based upon:   {Click here to calculate score.  REFRESH note before signing. :1}    ***cad, chf, htn ASSESSMENT AND PLAN: {Select the correct AFib Diagnosis                 :[id number]}   ESRD HD on MWF ***  HFrecEF EF 55-60% GDMT per primary cardiology team Fluid status appears stable today ***  OSA  Encouraged nightly CPAP The importance of adequate treatment of sleep apnea was discussed today in order to improve  our ability to maintain sinus rhythm long term. ***   Follow up ***   {Are you ordering a CV Procedure (e.g. stress test, cath, DCCV, TEE, etc)?   Press F2        :[id number]}   [facility] [address] [place], [place] [address] [phone] "

## 2024-04-14 ENCOUNTER — Emergency Department (HOSPITAL_COMMUNITY)
Admission: EM | Admit: 2024-04-14 | Discharge: 2024-04-14 | Disposition: A | Discharge status: Home / Self Care | Attending: Emergency Medicine | Admitting: Emergency Medicine

## 2024-04-14 ENCOUNTER — Emergency Department (HOSPITAL_COMMUNITY)

## 2024-04-14 ENCOUNTER — Encounter (HOSPITAL_COMMUNITY): Payer: Self-pay | Admitting: *Deleted

## 2024-04-14 ENCOUNTER — Other Ambulatory Visit: Payer: Self-pay

## 2024-04-14 DIAGNOSIS — N186 End stage renal disease: Secondary | ICD-10-CM | POA: Insufficient documentation

## 2024-04-14 DIAGNOSIS — Z7901 Long term (current) use of anticoagulants: Secondary | ICD-10-CM | POA: Diagnosis not present

## 2024-04-14 DIAGNOSIS — Z992 Dependence on renal dialysis: Secondary | ICD-10-CM | POA: Diagnosis not present

## 2024-04-14 DIAGNOSIS — Z7902 Long term (current) use of antithrombotics/antiplatelets: Secondary | ICD-10-CM | POA: Diagnosis not present

## 2024-04-14 DIAGNOSIS — I4891 Unspecified atrial fibrillation: Secondary | ICD-10-CM | POA: Diagnosis not present

## 2024-04-14 DIAGNOSIS — R079 Chest pain, unspecified: Secondary | ICD-10-CM

## 2024-04-14 DIAGNOSIS — Z7982 Long term (current) use of aspirin: Secondary | ICD-10-CM | POA: Insufficient documentation

## 2024-04-14 LAB — CBC
HCT: 34.4 % — ABNORMAL LOW (ref 39.0–52.0)
Hemoglobin: 11.3 g/dL — ABNORMAL LOW (ref 13.0–17.0)
MCH: 29.3 pg (ref 26.0–34.0)
MCHC: 32.8 g/dL (ref 30.0–36.0)
MCV: 89.1 fL (ref 80.0–100.0)
Platelets: 117 K/uL — ABNORMAL LOW (ref 150–400)
RBC: 3.86 MIL/uL — ABNORMAL LOW (ref 4.22–5.81)
RDW: 15.6 % — ABNORMAL HIGH (ref 11.5–15.5)
WBC: 4.8 K/uL (ref 4.0–10.5)
nRBC: 0 % (ref 0.0–0.2)

## 2024-04-14 LAB — TROPONIN T, HIGH SENSITIVITY
Troponin T High Sensitivity: 69 ng/L — ABNORMAL HIGH (ref 0–19)
Troponin T High Sensitivity: 69 ng/L — ABNORMAL HIGH (ref 0–19)

## 2024-04-14 LAB — BASIC METABOLIC PANEL WITH GFR
Anion gap: 12 (ref 5–15)
BUN: 15 mg/dL (ref 8–23)
CO2: 32 mmol/L (ref 22–32)
Calcium: 9.8 mg/dL (ref 8.9–10.3)
Chloride: 89 mmol/L — ABNORMAL LOW (ref 98–111)
Creatinine, Ser: 5.89 mg/dL — ABNORMAL HIGH (ref 0.61–1.24)
GFR, Estimated: 10 mL/min — ABNORMAL LOW
Glucose, Bld: 98 mg/dL (ref 70–99)
Potassium: 3.8 mmol/L (ref 3.5–5.1)
Sodium: 133 mmol/L — ABNORMAL LOW (ref 135–145)

## 2024-04-14 MED ORDER — ISOSORBIDE MONONITRATE ER 60 MG PO TB24: 90.0000 mg | ORAL_TABLET | Freq: Every day | ORAL | 0 refills | Status: AC

## 2024-04-14 MED ORDER — APIXABAN 5 MG PO TABS: 5.0000 mg | ORAL_TABLET | Freq: Two times a day (BID) | ORAL | 1 refills | Status: AC

## 2024-04-14 MED ORDER — APIXABAN 5 MG PO TABS
5.0000 mg | ORAL_TABLET | Freq: Two times a day (BID) | ORAL | Status: DC
  Administered 2024-04-14: 5 mg via ORAL
  Filled 2024-04-14: qty 1

## 2024-04-14 NOTE — Discharge Instructions (Signed)
 Important Discharge Instructions:  We have made some changes to your medications.  It is important that you review your medication list that is attached to these discharge instructions.  You can discuss this with your heart doctor when you see them next week.  Start Eliquis  twice daily (morning and night).  This is a blood thinning medicine that is used to prevent strokes in patients with of atrial fibrillation.  You should avoid using any type of NSAID medications, including ibuprofen , Aleve, aspirin , or Advil , since these can thin your blood.  (It is okay to take Tylenol  for pain.)  If you notice that you are having very dark or black looking stools, or blood in your stool, or you begin to feel very lightheaded or dizzy, stop taking Eliquis  and call your doctor or return to the hospital. If you experience any moderate or severe head injury, including a car accident, or getting hit in the head, you need to return to the hospital immediately to ensure that there is no bleeding on the brain.  2.  Stop taking clopidogrel  (Plavix ) and aspirin .  Because we are starting you on another blood thinner, you will stop these 2 medications for now.  You should discuss this with your cardiologist when you see them.  They may restart you on one or both of these medications in the future.  3.  Increase your dose of Imdur  from 60 mg to 90 mg once daily.  I represcribed this to your pharmacy at a higher dose.  This is a daily blood pressure medication that also can help with chest pain   Please follow-up for your next physical dialysis and for your cardiology appointment.  If you are having worsening chest pain, difficulty breathing, loss of consciousness, or any other emergency concerns, please return to the hospital.

## 2024-04-14 NOTE — ED Notes (Signed)
 Pt c/o worsening CP while in lobby, reports took home nitroglycerin  pill, repeat EKG obtained, troponin 69, EDP and charge RN made aware.

## 2024-04-14 NOTE — ED Notes (Signed)
 This RN called lab r/t in process BMP and troponin, informed that they are on the machine and in process should result in 10 mins or less

## 2024-04-14 NOTE — ED Provider Notes (Signed)
 " [facility] Provider Note   CSN: [id number] Arrival date & time: 04/14/24  1754     Patient presents with: Chest Pain   Craig Flynn is a 64 y.o. male with history of end-stage renal disease on dialysis, high blood pressure, coronary disease status post RCA occlusion, presented to the ED with complaint of worsening chest pain.  Patient reports he has had episodic chest pain for several months but has had increasing frequency in the past few days.  He was seen in the ED 2 weeks ago and diagnosed at the time of new onset atrial fibrillation.  He was not started on anticoagulation because he takes aspirin  and Plavix .  He was referred to the A-fib clinic but has not been able to schedule an appointment due to conflicting schedules with the cardiologist as well as the patient.  He returns to the ED today complaining of new worsening sharp chest pain.  He says he took 2 nitroglycerin  tablets at home which did bring down his pain from a 10 to a 3 out of 10.  He reports a mild headache after taking nitroglycerin .  Feels a little short of breath.   HPI     Prior to Admission medications  Medication Sig Start Date End Date Taking? Authorizing Provider  apixaban (ELIQUIS) 5 MG TABS tablet Take 1 tablet (5 mg total) by mouth 2 (two) times daily. 04/14/24 05/14/24 Yes Aviyah Swetz, Donnice PARAS, MD  acetaminophen  (TYLENOL ) 500 MG tablet Take 1,000 mg by mouth daily as needed for moderate pain (pain score 4-6) or mild pain (pain score 1-3).    [provider]  albuterol  (VENTOLIN  HFA) 108 (90 Base) MCG/ACT inhaler Inhale 2 puffs into the lungs every 6 (six) hours as needed for wheezing or shortness of breath. 09/02/23   Alvia Corean CROME, FNP  amLODipine  (NORVASC ) 5 MG tablet Take 1 tablet (5 mg total) by mouth daily. 09/28/23   Amin, Ankit C, MD  b complex-vitamin c-folic acid  (NEPHRO-VITE) 0.8 MG TABS tablet Take 1 tablet by mouth daily.    [provider]  calcitRIOL  (ROCALTROL ) 0.5 MCG capsule Take 8 capsules (4 mcg total) by mouth every Monday, Wednesday, and Friday with hemodialysis. 09/29/23   Amin, Ankit C, MD  cinacalcet  (SENSIPAR ) 30 MG tablet Take 3 tablets (90 mg total) by mouth daily with supper. Patient not taking: Reported on 02/24/2024 09/28/23   Caleen Burgess BROCKS, MD  cyanocobalamin  (VITAMIN B12) 1000 MCG tablet Take 1,000 mcg by mouth daily.    [provider]  furosemide  (LASIX ) 80 MG tablet Take 1 tablet (80 mg total) by mouth 2 (two) times daily. 02/01/24   Norleen Agent ORN, MD  gabapentin  (NEURONTIN ) 100 MG capsule Take 100 mg by mouth at bedtime. 09/17/23   [provider]  isosorbide  mononitrate (IMDUR ) 60 MG 24 hr tablet Take 1.5 tablets (90 mg total) by mouth daily. 04/14/24 05/14/24  Cottie Donnice PARAS, MD  latanoprost (XALATAN) 0.005 % ophthalmic solution Place 1 drop into the right eye at bedtime. Patient not taking: Reported on 02/24/2024    [provider]  levothyroxine  (SYNTHROID ) 125 MCG tablet TAKE 1 TABLET(125 MCG) BY MOUTH EVERY MORNING 05/06/23   Norleen Agent ORN, MD  Methoxy PEG-Epoetin Beta (MIRCERA IJ) 75 mcg. 11/22/23 11/20/24  [provider]  Methoxy PEG-Epoetin Beta (MIRCERA IJ) 30 mcg. Patient not taking: Reported on 02/24/2024 01/26/24 01/24/25  [provider]  nitroGLYCERIN  (NITROSTAT ) 0.4 MG SL  tablet DISSOLVE 1 TABLET UNDER THE TONGUE EVERY 5 MINUTES AS NEEDED FOR CHEST PAIN 02/01/24   Norleen Lynwood ORN, MD  pantoprazole  (PROTONIX ) 40 MG tablet Take 1 tablet (40 mg total) by mouth daily. Patient not taking: Reported on 02/24/2024 10/21/21   Norleen Lynwood ORN, MD  sevelamer  carbonate (RENVELA ) 800 MG tablet Take 800 mg by mouth in the morning and at bedtime. Patient not taking: Reported on 02/24/2024    [provider]  Tuberculin PPD (TUBERSOL ID) Inject 0.1 mLs into the skin. 12/13/23   [provider]    Allergies: Patient has no known allergies.    Review  of Systems  Updated Vital Signs BP (!) 152/72 (BP Location: Right Arm)   Pulse 85   Temp 98.6 F (37 C) (Oral)   Resp 14   Ht 6' 2 (1.88 m)   Wt 124.7 kg   SpO2 100%   BMI 35.30 kg/m   Physical Exam Constitutional:      General: He is not in acute distress. HENT:     Head: Normocephalic and atraumatic.  Eyes:     Conjunctiva/sclera: Conjunctivae normal.     Pupils: Pupils are equal, round, and reactive to light.  Cardiovascular:     Rate and Rhythm: Normal rate. Rhythm irregular.  Pulmonary:     Effort: Pulmonary effort is normal. No respiratory distress.  Abdominal:     General: There is no distension.     Tenderness: There is no abdominal tenderness.  Skin:    General: Skin is warm and dry.  Neurological:     General: No focal deficit present.     Mental Status: He is alert. Mental status is at baseline.  Psychiatric:        Mood and Affect: Mood normal.        Behavior: Behavior normal.     (all labs ordered are listed, but only abnormal results are displayed) Labs Reviewed  BASIC METABOLIC PANEL WITH GFR - Abnormal; Notable for the following components:      Result Value   Sodium 133 (*)    Chloride 89 (*)    Creatinine, Ser 5.89 (*)    GFR, Estimated 10 (*)    All other components within normal limits  CBC - Abnormal; Notable for the following components:   RBC 3.86 (*)    Hemoglobin 11.3 (*)    HCT 34.4 (*)    RDW 15.6 (*)    Platelets 117 (*)    All other components within normal limits  TROPONIN T, HIGH SENSITIVITY - Abnormal; Notable for the following components:   Troponin T High Sensitivity 69 (*)    All other components within normal limits  TROPONIN T, HIGH SENSITIVITY - Abnormal; Notable for the following components:   Troponin T High Sensitivity 69 (*)    All other components within normal limits    EKG: EKG Interpretation Date/Time:  Friday April 14 2024 20:44:42 EST Ventricular Rate:  78 PR Interval:    QRS Duration:  104 QT  Interval:  420 QTC Calculation: 478 R Axis:   40  Text Interpretation: Atrial flutter When compared with ECG of 14-Apr-2024 18:02, PREVIOUS ECG IS PRESENT Confirmed by Cottie Cough [phone]) on 04/14/2024 9:20:46 PM  Radiology: [facility] Chest 2 View Result Date: 04/14/2024 CLINICAL DATA:  Chest pain. EXAM: CHEST - 2 VIEW COMPARISON:  April 01, 2024 FINDINGS: The heart size and mediastinal contours are within normal limits. There is marked severity calcification of the  aortic arch mild bibasilar atelectasis and/or infiltrate is seen, right greater than left this is stable in appearance when compared to the prior study. A superimposed component of breast attenuation artifact cannot be excluded. No pleural effusion or pneumothorax is identified. The visualized skeletal structures are unremarkable. IMPRESSION: Mild bibasilar atelectasis and/or infiltrate, right greater than left. Electronically Signed   By: Suzen Dials M.D.   On: 04/14/2024 18:45     Procedures   Medications Ordered in the ED  apixaban (ELIQUIS) tablet 5 mg (has no administration in time range)    Clinical Course as of 04/14/24 2320  Fri Apr 14, 2024  2243 Cardiologist Dr Hillary by phone advises that we could consider increasing the patient's Imdur  dosing to 90 or 120 mg.  We should start him on Eliquis given CHA2DS2-VASc score of 2, and discontinue his Plavix  for now.  His chest pains may be related to volume overloading or being under diuresed, as it appears to be a chronic issue, and he had otherwise stable coronary cath in July.  They can arrange for outpatient follow up for the Afib and chest pain reassessment. [MT]  2314 Patient has minimal to no pain and is comfortable going home.  He has a follow-up appointment with his cardiologist Thursday (Dr Lavona).  We did discuss starting Eliquis and his medication changes and I will provide a summary in his written discharge. [MT]    Clinical Course User Index [MT] Yaneisy Wenz,  Donnice PARAS, MD                                 Medical Decision Making Risk Prescription drug management.   This patient presents to the ED with concern for pressure pain this involves an extensive number of treatment options, and is a complaint that carries with it a high risk of complications and morbidity.  The differential diagnosis includes stable angina versus viral URI including influenza versus bacterial infection or pneumonia versus reflux versus other  CHADS VASC 2 score of 2 (+1 HTN, +1 Vascular disease)  Co-morbidities that complicate the patient evaluation: Known coronary disease, high risk of occlusion, cardiovascular is factors including high blood pressure   External records from outside source obtained and reviewed including left or catheterization in July 2025 noting moderate LAD disease, proximal RCA occlusion with bridging collaterals  I ordered and personally interpreted labs.  The pertinent results include: No emergent findings, delta troponin is flat with very mild elevation at 69, consistent with known kidney disease  I ordered imaging studies including x-ray of the chest I independently visualized and interpreted imaging which showed mild bibasilar atelectasis I agree with the radiologist interpretation  The patient was maintained on a cardiac monitor.  I personally viewed and interpreted the cardiac monitored which showed an underlying rhythm of: Atrial flutter, rate control  Per my interpretation the patient's ECG shows atrial flutter, no acute ischemia  I ordered medication including Eliquis for nonvalvular atrial fibrillation  I have reviewed the patients home medicines and have made adjustments as needed  Test Considered: Lower suspicion for acute PE in this clinical setting.  Doubt aortic dissection.  I requested consultation with the cardiology,  and discussed lab and imaging findings as well as pertinent plan - they recommend: See ED  course   Dispostion:  After consideration of the diagnostic results and the patients response to treatment, I feel that the patient would benefit from close  outpatient follow-up.      Final diagnoses:  Atrial fibrillation, unspecified type (HCC)  Chest pain, unspecified type    ED Discharge Orders          Ordered    apixaban (ELIQUIS) 5 MG TABS tablet  2 times daily        04/14/24 2319    isosorbide  mononitrate (IMDUR ) 60 MG 24 hr tablet  Daily        04/14/24 2319               Cottie Donnice PARAS, MD 04/14/24 2320  "

## 2024-04-14 NOTE — ED Triage Notes (Signed)
 Pt present for 7/10 centralized Chest pain with a little SOB,since this afternoon.  Hx of afib.

## 2024-04-14 NOTE — ED Notes (Signed)
 Provider at bedside.

## 2024-04-18 NOTE — Progress Notes (Unsigned)
 " Cardiology Office Note:   Date:  04/20/2024  ID:  Craig Flynn Agent, DOB Jan 22, 1961, MRN [id number] PCP: Geofm Glade PARAS, MD  [facility] Providers Cardiologist:  Agent Schilling, MD {  History of Present Illness:   Craig Flynn is a 64 y.o. male who presents for evaluation of cardiomyopathy and hypertension. The patient has long-standing hypertension. He has had a mildly reduced ejection fraction of about 45%. In July 2016 his EF was said to be about 55%.    Perfusion  study in 2017 demonstrated EF of 47% with a fixed inferior wall defect.   I sent him for a POET (Plain Old Exercise Treadmill) in 2021 which was negative but he had a suboptimal heart rate response.    Echo in April of 2021 demonstrated an EF of 60 to 65%.   We saw him in July 2025 with chest pian.  He had a PET scan with ischemia.  Cath demonstrated an occluded RCA and he had unsuccessful PCI.    He was seen in the hospital in early January with new onset atrial fibrillation.  He was started on Eliquis .  He had been in urgent care prior to this with lower extremity edema treated with IV Lasix .  He said that he really had a twinge in his chest with a feeling of something going on with his heart.  He did not have presyncope or syncope.  He really could not quantify or qualify.  He did not describe rapid heart rates.  Looks like his rate has been controlled.  He unfortunately could not afford the Eliquis  when given the prescription so he has not started this yet.  He has otherwise been doing okay.  He does dialysis.  He has not had any problems with this and has no new PND or orthopnea.  He is got no new chest pressure, neck or arm discomfort.  ROS: As stated in the HPI and negative for all other systems.  Studies Reviewed:    EKG:   EKG Interpretation Date/Time:  Thursday April 20 2024 09:45:13 EST Ventricular Rate:  94 PR Interval:  206 QRS Duration:  114 QT Interval:  392 QTC Calculation: 490 R Axis:   11  Text  Interpretation: Atrial flutter When compared with ECG of 14-Apr-2024 20:44, No significant change since last tracing Confirmed by Schilling Rattan ([id number]) on 04/20/2024 9:56:13 AM   Risk Assessment/Calculations:    CHA2DS2-VASc Score = 2   This indicates a 2.2% annual risk of stroke. The patient's score is based upon: CHF History: 0 HTN History: 1 Diabetes History: 0 Stroke History: 0 Vascular Disease History: 1 Age Score: 0 Gender Score: 0       Physical Exam:   VS:  BP (!) 160/70 (BP Location: Right Arm, Patient Position: Sitting, Cuff Size: Large)   Pulse 71   Ht 6' 2 (1.88 m)   Wt 274 lb 14.4 oz (124.7 kg)   SpO2 95%   BMI 35.30 kg/m    Wt Readings from Last 3 Encounters:  04/20/24 274 lb 14.4 oz (124.7 kg)  04/14/24 274 lb 14.6 oz (124.7 kg)  04/01/24 275 lb (124.7 kg)     GEN: Well nourished, well developed in no acute distress NECK: No JVD; No carotid bruits CARDIAC: IrregularRR, no murmurs, rubs, gallops RESPIRATORY:  Clear to auscultation without rales, wheezing or rhonchi  ABDOMEN: Soft, non-tender, non-distended EXTREMITIES:  Trace edema; No deformity   ASSESSMENT AND PLAN:   CAD:  No change in therapy other than stop the aspirin  and restart the Eliquis .  ESRD: He is followed at dialysis and has no problems with this.   Essential hypertension; uncontrolled: Mildly elevated on nondialysis days.  He can continue to keep a diary on this and will continue the meds as listed for now.  Chronic diastolic CHF:   Volume is managed through dialysis.   Hyperlipidemia:   LDL is 55.  Continue meds as listed.   Hypothyroidism:   Continue previous Synthroid  dose.   Anemia, chronic: I looked at his labs from the emergency room and he has mild chronic stable anemia.  No change in therapy.   GERD:  continue PPI.      OSA:  He uses CPAP.   Follow up with me in  months.   Signed, Lynwood Schilling, MD   "

## 2024-04-20 ENCOUNTER — Other Ambulatory Visit (HOSPITAL_COMMUNITY): Payer: Self-pay

## 2024-04-20 ENCOUNTER — Encounter: Payer: Self-pay | Admitting: Cardiology

## 2024-04-20 ENCOUNTER — Ambulatory Visit: Attending: Cardiology | Admitting: Cardiology

## 2024-04-20 VITALS — BP 160/70 | HR 71 | Ht 74.0 in | Wt 274.9 lb

## 2024-04-20 DIAGNOSIS — E785 Hyperlipidemia, unspecified: Secondary | ICD-10-CM | POA: Diagnosis not present

## 2024-04-20 DIAGNOSIS — I10 Essential (primary) hypertension: Secondary | ICD-10-CM | POA: Diagnosis not present

## 2024-04-20 DIAGNOSIS — I4891 Unspecified atrial fibrillation: Secondary | ICD-10-CM | POA: Diagnosis not present

## 2024-04-20 DIAGNOSIS — I5032 Chronic diastolic (congestive) heart failure: Secondary | ICD-10-CM | POA: Diagnosis not present

## 2024-04-20 DIAGNOSIS — I25110 Atherosclerotic heart disease of native coronary artery with unstable angina pectoris: Secondary | ICD-10-CM

## 2024-04-20 MED ORDER — APIXABAN 5 MG PO TABS
5.0000 mg | ORAL_TABLET | Freq: Two times a day (BID) | ORAL | 0 refills | Status: AC
  Filled 2024-04-20: qty 60, 30d supply, fill #0

## 2024-04-20 NOTE — Patient Instructions (Signed)
 Medication Instructions:  Your physician recommends that you continue on your current medications as directed. Please refer to the Current Medication list given to you today.  *If you need a refill on your cardiac medications before your next appointment, please call your pharmacy*  Lab Work: NONE If you have labs (blood work) drawn today and your tests are completely normal, you will receive your results only by: MyChart Message (if you have MyChart) OR A paper copy in the mail If you have any lab test that is abnormal or we need to change your treatment, we will call you to review the results.  Testing/Procedures: NONE  Follow-Up: At [facility], you and your health needs are our priority.  As part of our continuing mission to provide you with exceptional heart care, our providers are all part of one team.  This team includes your primary Cardiologist (physician) and Advanced Practice Providers or APPs (Physician Assistants and Nurse Practitioners) who all work together to provide you with the care you need, when you need it.  Your next appointment:   3 weeks Afib clinic (referral placed)  2 months Hochrein, MD   We recommend signing up for the patient portal called MyChart.  Sign up information is provided on this After Visit Summary.  MyChart is used to connect with patients for Virtual Visits (Telemedicine).  Patients are able to view lab/test results, encounter notes, upcoming appointments, etc.  Non-urgent messages can be sent to your provider as well.   To learn more about what you can do with MyChart, go to [email or web address].

## 2024-04-24 ENCOUNTER — Telehealth (HOSPITAL_BASED_OUTPATIENT_CLINIC_OR_DEPARTMENT_OTHER): Payer: Self-pay

## 2024-04-28 NOTE — Telephone Encounter (Signed)
 Copied from CRM [id number]. Topic: Clinical - Medical Advice >> Apr 28, 2024  1:09 PM Craig Flynn wrote: Reason for CRM: Patient said he would like the diagnostics changed on his CPAP machine because it's blowing too hard.    Called and spoke to patient. Pt states he feels it is blowing too hard and he did not have this problem with his prior machine.  Pt states he just started using the machine on 04/25/24 and understands that he will have to get used to a new machine, but is requesting that the pressure be lowered. Please advise, thank you!

## 2024-05-11 ENCOUNTER — Ambulatory Visit (HOSPITAL_COMMUNITY): Admitting: Physician Assistant

## 2024-05-30 ENCOUNTER — Ambulatory Visit: Admitting: Pulmonary Disease

## 2024-06-27 ENCOUNTER — Ambulatory Visit: Admitting: Cardiology

## 2024-07-07 ENCOUNTER — Ambulatory Visit

## 2024-07-13 ENCOUNTER — Ambulatory Visit: Admitting: Family Medicine
